# Patient Record
Sex: Male | Born: 1960 | Race: White | Hispanic: No | Smoking: Never smoker
Health system: Southern US, Community
[De-identification: ages and names within clinical notes are randomized; demographics above are authoritative.]

## PROBLEM LIST (undated history)

## (undated) DIAGNOSIS — I10 Essential (primary) hypertension: Secondary | ICD-10-CM

## (undated) DIAGNOSIS — Z973 Presence of spectacles and contact lenses: Secondary | ICD-10-CM

## (undated) DIAGNOSIS — R1909 Other intra-abdominal and pelvic swelling, mass and lump: Secondary | ICD-10-CM

## (undated) DIAGNOSIS — Z7189 Other specified counseling: Secondary | ICD-10-CM

## (undated) DIAGNOSIS — R222 Localized swelling, mass and lump, trunk: Secondary | ICD-10-CM

## (undated) DIAGNOSIS — G709 Myoneural disorder, unspecified: Secondary | ICD-10-CM

## (undated) DIAGNOSIS — Z8669 Personal history of other diseases of the nervous system and sense organs: Secondary | ICD-10-CM

## (undated) DIAGNOSIS — F419 Anxiety disorder, unspecified: Secondary | ICD-10-CM

## (undated) DIAGNOSIS — C8115 Nodular sclerosis classical Hodgkin lymphoma, lymph nodes of inguinal region and lower limb: Secondary | ICD-10-CM

## (undated) DIAGNOSIS — Z923 Personal history of irradiation: Secondary | ICD-10-CM

## (undated) DIAGNOSIS — G459 Transient cerebral ischemic attack, unspecified: Secondary | ICD-10-CM

## (undated) DIAGNOSIS — I499 Cardiac arrhythmia, unspecified: Secondary | ICD-10-CM

## (undated) DIAGNOSIS — G20A1 Parkinson's disease without dyskinesia, without mention of fluctuations: Secondary | ICD-10-CM

## (undated) DIAGNOSIS — I493 Ventricular premature depolarization: Secondary | ICD-10-CM

## (undated) DIAGNOSIS — Z9484 Stem cells transplant status: Secondary | ICD-10-CM

## (undated) HISTORY — DX: Personal history of irradiation: Z92.3

## (undated) HISTORY — DX: Other specified counseling: Z71.89

---
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
## 2000-04-02 ENCOUNTER — Other Ambulatory Visit: Admission: RE | Admit: 2000-04-02 | Discharge: 2000-04-02 | Payer: Self-pay | Admitting: Oral Surgery

## 2001-03-06 ENCOUNTER — Other Ambulatory Visit: Admission: RE | Admit: 2001-03-06 | Discharge: 2001-03-06 | Payer: Self-pay | Admitting: Oral Surgery

## 2004-03-14 ENCOUNTER — Ambulatory Visit (HOSPITAL_COMMUNITY): Admission: RE | Admit: 2004-03-14 | Discharge: 2004-03-14 | Payer: Self-pay | Admitting: Gastroenterology

## 2007-11-28 ENCOUNTER — Emergency Department (HOSPITAL_COMMUNITY): Admission: EM | Admit: 2007-11-28 | Discharge: 2007-11-29 | Payer: Self-pay | Admitting: Emergency Medicine

## 2011-04-13 NOTE — Op Note (Signed)
NAME:  Brian, [name]                            ACCOUNT NO.:  [id number]   MEDICAL RECORD NO.:  [id number]                   PATIENT TYPE:  AMB   LOCATION:  ENDO                                 FACILITY:  [facility]   PHYSICIAN:  John C. Madilyn Fireman, M.D.                 DATE OF BIRTH:  05/04/1961   DATE OF PROCEDURE:  03/14/2004  DATE OF DISCHARGE:                                 OPERATIVE REPORT   PROCEDURE:  Colonoscopy.   INDICATIONS FOR PROCEDURE:  Intermittent rectal bleeding and family history  of colon polyps in a first-degree relative.   DESCRIPTION OF PROCEDURE:  The patient was placed in the left lateral  decubitus position and placed on the pulse monitor with continuous low-flow  oxygen delivered by nasal cannula.  He was sedated with 87.5 mcg IV fentanyl  and 6 mg IV Versed.  The Olympus video colonoscope was inserted into the  rectum and advanced to the cecum, confirmed by transillumination at  [name]'s point and visualization of the ileocecal valve and appendiceal  orifice.  The prep was excellent.  The cecum, ascending, transverse,  descending, and sigmoid colon all appeared normal with no masses, polyps,  diverticula, or other mucosal abnormalities.  The rectum likewise appeared  normal, and retroflexed view of the anus showed some small internal  hemorrhoids.  The scope was then withdrawn, and the patient returned to the  recovery room in stable condition.  He tolerated the procedure well, and  there were no immediate complications.   IMPRESSION:  1. Internal hemorrhoids.  2. Otherwise, normal study.   PLAN:  Based on his family history of colon polyps, we will repeat  colonoscopy at age 50.                                               John C. Madilyn Fireman, M.D.    JCH/MEDQ  D:  03/14/2004  T:  03/14/2004  Job:  [id number]   cc:   Brett Canales A. [name], M.D.  [address]  [place]  [place] [address]  Fax: [phone]

## 2011-08-07 ENCOUNTER — Other Ambulatory Visit: Payer: Self-pay | Admitting: Emergency Medicine

## 2011-08-07 ENCOUNTER — Ambulatory Visit
Admission: RE | Admit: 2011-08-07 | Discharge: 2011-08-07 | Disposition: A | Discharge status: Home / Self Care | Payer: PRIVATE HEALTH INSURANCE | Source: Ambulatory Visit | Attending: Emergency Medicine | Admitting: Emergency Medicine

## 2011-08-07 DIAGNOSIS — N5089 Other specified disorders of the male genital organs: Secondary | ICD-10-CM

## 2011-08-07 MED ORDER — IOHEXOL 300 MG/ML  SOLN
100.0000 mL | Freq: Once | INTRAMUSCULAR | Status: AC | PRN
  Administered 2011-08-07: 100 mL via INTRAVENOUS

## 2011-08-08 ENCOUNTER — Ambulatory Visit
Admission: RE | Admit: 2011-08-08 | Discharge: 2011-08-08 | Disposition: A | Discharge status: Home / Self Care | Payer: PRIVATE HEALTH INSURANCE | Source: Ambulatory Visit | Attending: Emergency Medicine | Admitting: Emergency Medicine

## 2011-08-08 DIAGNOSIS — N5089 Other specified disorders of the male genital organs: Secondary | ICD-10-CM

## 2011-08-09 ENCOUNTER — Ambulatory Visit (INDEPENDENT_AMBULATORY_CARE_PROVIDER_SITE_OTHER): Payer: PRIVATE HEALTH INSURANCE | Admitting: Surgery

## 2011-08-09 ENCOUNTER — Encounter (INDEPENDENT_AMBULATORY_CARE_PROVIDER_SITE_OTHER): Payer: Self-pay | Admitting: Surgery

## 2011-08-09 VITALS — BP 122/72 | HR 72 | Temp 97.5°F | Ht 70.0 in | Wt 177.5 lb

## 2011-08-09 DIAGNOSIS — R591 Generalized enlarged lymph nodes: Secondary | ICD-10-CM

## 2011-08-09 DIAGNOSIS — C8195 Hodgkin lymphoma, unspecified, lymph nodes of inguinal region and lower limb: Secondary | ICD-10-CM | POA: Insufficient documentation

## 2011-08-09 DIAGNOSIS — R599 Enlarged lymph nodes, unspecified: Secondary | ICD-10-CM

## 2011-08-09 NOTE — Patient Instructions (Signed)
We will schedule he for outpatient surgery to remove some lymph nodes from the right groin area. They will go for examination by the pathologist and we should have the report two or three days after surgery.

## 2011-08-09 NOTE — Progress Notes (Signed)
Chief Complaint  Patient presents with  . Other    swollen lymph nodes, groin    HPI Brian [name] is a 51 y.o. male.  On a routine physical examination by his primary care physician some inguinal adenopathy was noted on the right side. He was asymptomatic although he does note that he had a couple of sweaty nights recently. He's had no abdominal pain no change in his energy level no weight loss or weight gain and overall feels well.  Because of his palpable abnormality an abdominal pelvic CT scan was done. This has shown some periaortic and iliac lymphadenopathy. He was sent to see me for evaluation for possible biopsy. HPI  History reviewed. No pertinent past medical history.  History reviewed. No pertinent past surgical history.  Family History  Problem Relation Age of Onset  . Cancer Mother 41    ovarian  . Heart disease Father   . Stroke Father   . Cancer Sister     ovarian  . Cancer Brother     testicular    Social History History  Substance Use Topics  . Smoking status: Never Smoker   . Smokeless tobacco: Not on file  . Alcohol Use: Yes    No Known Allergies  No current outpatient prescriptions on file.    Review of Systems Review of Systems  Constitutional: Negative for fever, chills and unexpected weight change.  HENT: Negative for hearing loss, congestion, sore throat, trouble swallowing and voice change.   Eyes: Negative for visual disturbance.  Respiratory: Negative for cough and wheezing.   Cardiovascular: Negative for chest pain, palpitations and leg swelling.  Gastrointestinal: Negative for nausea, vomiting, abdominal pain, diarrhea, constipation, blood in stool, abdominal distention, anal bleeding and rectal pain.  Genitourinary: Negative for hematuria and difficulty urinating.  Musculoskeletal: Negative for arthralgias.  Skin: Negative for rash and wound.  Neurological: Negative for seizures, syncope, weakness and headaches.  Hematological:  Negative for adenopathy. Does not bruise/bleed easily.  Psychiatric/Behavioral: Negative for confusion.     Blood pressure 122/72, pulse 72, temperature 97.5 F (36.4 C), temperature source Temporal, height 5\' 10"  (1.778 m), weight 177 lb 8 oz (80.513 kg).  Physical Exam Physical Exam GENERAL: The patient is alert, oriented, and generally healthy-appearing, NAD. Mood and affect are normal.  HEENT: The head is normocephalic, the eyes nonicteric, the pupils were round regular and equal. EOMs are normal. Pharynx normal. Dentition good.  NECK: The neck is supple and there are no masses or thyromegaly.  LUNGS: Normal respirations and clear to auscultation.  HEART: Regular rhythm, with no murmurs rubs or gallops. Pulses are intact carotid dorsalis pedis and posterior tibial. No significant varicosities are noted.  ABDOMEN: Soft, flat, and nontender. No masses or organomegaly is noted. No hernias are noted. Bowel sounds are normal. There is bilateral inguinal adenopath, R>L  GU:nl EXTREMITIES: Good range of motion, no edema.   Data Reviewed I have reviewed the laboratory studies from Dr. Juline Patch office, his CT scan and report and reviewed the scan with patient. He does have a somewhat low hemoglobin of 11.4 with several months ago it was 14. Except for that his labs were unremarkable.  CT scan confirms some periaortic and iliac adenopathy. Most of the nodes are slightly enlarged at about 1.2 cm, and there is one about 2.4 cm.  Assessment    He appears to have bilateral adenopathy which is somewhat worrisome for possible Hodgkin's disease or lymphoma.    Plan  I think we need to surgically remove some of these right inguinal lymph nodes for pathological examination. These are nondiagnostic, we'll need to review options for biopsy of some of the intra-abdominal nodes.  I've discussed this plan with the patient in detail. We talked about the surgical indications and described what is  involved with the surgery along with risks and complications. I think all questions have been answered.       Aldine Chakraborty J 08/09/2011, 4:34 PM

## 2011-08-15 ENCOUNTER — Encounter (HOSPITAL_COMMUNITY)
Admission: RE | Admit: 2011-08-15 | Discharge: 2011-08-15 | Disposition: A | Discharge status: Home / Self Care | Payer: PRIVATE HEALTH INSURANCE | Source: Ambulatory Visit | Attending: Surgery | Admitting: Surgery

## 2011-08-15 ENCOUNTER — Other Ambulatory Visit (INDEPENDENT_AMBULATORY_CARE_PROVIDER_SITE_OTHER): Payer: Self-pay | Admitting: Surgery

## 2011-08-15 DIAGNOSIS — R591 Generalized enlarged lymph nodes: Secondary | ICD-10-CM

## 2011-08-15 LAB — SURGICAL PCR SCREEN: Staphylococcus aureus: NEGATIVE

## 2011-08-16 ENCOUNTER — Other Ambulatory Visit (INDEPENDENT_AMBULATORY_CARE_PROVIDER_SITE_OTHER): Payer: Self-pay | Admitting: Surgery

## 2011-08-16 ENCOUNTER — Encounter (INDEPENDENT_AMBULATORY_CARE_PROVIDER_SITE_OTHER): Payer: Self-pay | Admitting: Surgery

## 2011-08-16 ENCOUNTER — Ambulatory Visit (HOSPITAL_COMMUNITY)
Admission: RE | Admit: 2011-08-16 | Discharge: 2011-08-16 | Disposition: A | Discharge status: Home / Self Care | Payer: PRIVATE HEALTH INSURANCE | Source: Ambulatory Visit | Attending: Surgery | Admitting: Surgery

## 2011-08-16 DIAGNOSIS — Z807 Family history of other malignant neoplasms of lymphoid, hematopoietic and related tissues: Secondary | ICD-10-CM | POA: Insufficient documentation

## 2011-08-16 DIAGNOSIS — Z01818 Encounter for other preprocedural examination: Secondary | ICD-10-CM | POA: Insufficient documentation

## 2011-08-16 DIAGNOSIS — R599 Enlarged lymph nodes, unspecified: Secondary | ICD-10-CM

## 2011-08-16 DIAGNOSIS — Z01812 Encounter for preprocedural laboratory examination: Secondary | ICD-10-CM | POA: Insufficient documentation

## 2011-08-16 DIAGNOSIS — Z8043 Family history of malignant neoplasm of testis: Secondary | ICD-10-CM | POA: Insufficient documentation

## 2011-08-16 HISTORY — PX: OTHER SURGICAL HISTORY: SHX169

## 2011-08-17 NOTE — Op Note (Signed)
  NAMEKENITH, TRICKEL                  ACCOUNT NO.:  [id number]  MEDICAL RECORD NO.:  [id number]  LOCATION:  SDSC                         FACILITY:  [facility]  PHYSICIAN:  [name], M.D.DATE OF BIRTH:  09-06-1961  DATE OF PROCEDURE:  08/16/2011 DATE OF DISCHARGE:                              OPERATIVE REPORT   PREOPERATIVE DIAGNOSIS:  Lymphadenopathy.  POSTOPERATIVE DIAGNOSIS:  Lymphadenopathy.  PROCEDURE:  Removal of right inguinal lymph nodes.  SURGEON:  [name], M.D.  ANESTHESIA:  MAC.  CLINICAL HISTORY:  This is a 50 year old gentleman who has a family history of both lymphoma and testicular cancer who developed some lymphadenopathy noted on routine physical and was otherwise asymptomatic.  CT had shown some nodes on the abdomen and he had palpable right inguinal lymph nodes what was noted on original physical. After discussion with the patient, we elected to remove some of these right inguinal lymph nodes as surgical biopsy.  DESCRIPTION OF PROCEDURE:  The patient was seen in the preoperative area and had no further questions.  Identified and marked the right inguinal area as the surgical site.  The patient was taken to the operating room, given IV sedation.  The groin area was clipped, prepped and draped and the time-out was done. Xylocaine 0.1% plus 0.5% Marcaine with epinephrine was mixed locally and used for local.  I made an inguinal incision directly in the inguinal crease over some palpable adenopathy.  Small superficial vein was ligated.  A firm, what appeared to be about 1 cm node was identified very superficially and I started to dissect that out, and it had connected to a second node a little bit more superior that came out as well.  I could not tell if this was a dumbbell shaped lymph node or two nodes with a fairly thick attachment between the two.  This was all done with cautery and blunt dissection.  The second lymph node was  palpable more medially and medial to the vessels.  I was able to dissect that out, and this was about a 2.5 cm, firm, pink lymph node.  It came out intact and in toto.  I made sure everything was dry.  The incision was then closed with 3-0 Vicryl, 4-0 Monocryl subcuticular and some Dermabond.  The patient tolerated the procedure well, and there were no complications.  All counts were correct.     [name], M.D.     CJS/MEDQ  D:  08/16/2011  T:  08/16/2011  Job:  [id number]  Electronically Signed by Cyndia Bent M.D. on 08/17/2011 02:32:43 PM

## 2011-08-20 ENCOUNTER — Telehealth (INDEPENDENT_AMBULATORY_CARE_PROVIDER_SITE_OTHER): Payer: Self-pay | Admitting: General Surgery

## 2011-08-20 NOTE — Telephone Encounter (Signed)
Patient aware path is not malignant. He will follow up with [place] on Thursday and call with any problems prior to that.

## 2011-08-20 NOTE — Telephone Encounter (Signed)
Dr [name] called re: patient's pathology results. Lymph node biopsy was negative. Dr [name] is going to refer patient to Dr Myna Hidalgo for evaluation because he still has multiple nodes and he would rather he manage this. If you need to speak with him you can call or if you have any other suggestions.

## 2011-08-20 NOTE — Telephone Encounter (Signed)
Message copied by Liliana Cline on Mon Aug 20, 2011 10:56 AM ------      Message from: [name]      Created: Sat Aug 18, 2011 11:01 AM       Tell him path is benign, no lymphoma or cancer. I can discuss more when he comes in for post op visit

## 2011-08-22 ENCOUNTER — Ambulatory Visit (INDEPENDENT_AMBULATORY_CARE_PROVIDER_SITE_OTHER): Payer: PRIVATE HEALTH INSURANCE | Admitting: Surgery

## 2011-08-23 ENCOUNTER — Encounter (INDEPENDENT_AMBULATORY_CARE_PROVIDER_SITE_OTHER): Payer: Self-pay | Admitting: Surgery

## 2011-08-23 ENCOUNTER — Ambulatory Visit (INDEPENDENT_AMBULATORY_CARE_PROVIDER_SITE_OTHER): Payer: PRIVATE HEALTH INSURANCE | Admitting: Surgery

## 2011-08-23 VITALS — BP 126/84 | HR 56 | Temp 97.4°F | Resp 12 | Ht 70.0 in | Wt 177.6 lb

## 2011-08-23 DIAGNOSIS — R591 Generalized enlarged lymph nodes: Secondary | ICD-10-CM

## 2011-08-23 DIAGNOSIS — R599 Enlarged lymph nodes, unspecified: Secondary | ICD-10-CM

## 2011-08-23 NOTE — Progress Notes (Signed)
NAME: Brian [name]                                            DOB: Apr 28, 1961 DATE: 08/23/2011                                                  MRN: [id number]  CC: Post op R LN excision  HPI: This patient comes in for post op follow-up.Heunderwent Right inguinal lymph node excision on 08/16/2011. He feels that he is doing well.  PE: General: The patient appears to be healthy, NAD The incision is healing OK, but there is a little fluid just above it  DATA REVIEWED: Path report noted and discussed with him. Gave him a copy of the report  IMPRESSION: The patient is doing well S/P Node biopsy, but small seroma.    PLAN: Seroma aspirated of 3 cc of clear fluid. RTC PRN

## 2011-08-23 NOTE — Patient Instructions (Signed)
If you think there is more fluid around the incision call and come back to be checked. Otherwise you don't have to come back.  Avoid strenuous activity another week

## 2011-08-27 ENCOUNTER — Ambulatory Visit: Payer: PRIVATE HEALTH INSURANCE | Admitting: Hematology & Oncology

## 2011-09-06 ENCOUNTER — Ambulatory Visit (HOSPITAL_BASED_OUTPATIENT_CLINIC_OR_DEPARTMENT_OTHER): Payer: PRIVATE HEALTH INSURANCE | Admitting: Hematology & Oncology

## 2011-09-06 ENCOUNTER — Other Ambulatory Visit: Payer: Self-pay | Admitting: Hematology & Oncology

## 2011-09-06 DIAGNOSIS — R599 Enlarged lymph nodes, unspecified: Secondary | ICD-10-CM

## 2011-09-06 LAB — CBC WITH DIFFERENTIAL (CANCER CENTER ONLY)
BASO#: 0 10*3/uL (ref 0.0–0.2)
HCT: 37.6 % — ABNORMAL LOW (ref 38.7–49.9)
HGB: 12.3 g/dL — ABNORMAL LOW (ref 13.0–17.1)
LYMPH#: 0.9 10*3/uL (ref 0.9–3.3)
LYMPH%: 10 % — ABNORMAL LOW (ref 14.0–48.0)
MCHC: 32.7 g/dL (ref 32.0–35.9)
MCV: 82 fL (ref 82–98)
MONO#: 0.8 10*3/uL (ref 0.1–0.9)
NEUT%: 80.6 % — ABNORMAL HIGH (ref 40.0–80.0)

## 2011-09-06 LAB — COMPREHENSIVE METABOLIC PANEL
Albumin: 4.2 g/dL (ref 3.5–5.2)
BUN: 17 mg/dL (ref 6–23)
Calcium: 9.3 mg/dL (ref 8.4–10.5)
Chloride: 104 mEq/L (ref 96–112)
Glucose, Bld: 116 mg/dL — ABNORMAL HIGH (ref 70–99)
Potassium: 4.4 mEq/L (ref 3.5–5.3)

## 2011-09-06 LAB — SEDIMENTATION RATE: Sed Rate: 23 mm/hr — ABNORMAL HIGH (ref 0–16)

## 2011-09-06 LAB — LACTATE DEHYDROGENASE: LDH: 126 U/L (ref 94–250)

## 2011-09-13 ENCOUNTER — Other Ambulatory Visit: Payer: Self-pay | Admitting: Hematology & Oncology

## 2011-09-13 DIAGNOSIS — R591 Generalized enlarged lymph nodes: Secondary | ICD-10-CM

## 2011-12-26 ENCOUNTER — Ambulatory Visit
Admission: RE | Admit: 2011-12-26 | Discharge: 2011-12-26 | Disposition: A | Discharge status: Home / Self Care | Payer: PRIVATE HEALTH INSURANCE | Source: Ambulatory Visit | Attending: Hematology & Oncology | Admitting: Hematology & Oncology

## 2011-12-26 DIAGNOSIS — R591 Generalized enlarged lymph nodes: Secondary | ICD-10-CM

## 2011-12-26 MED ORDER — IOHEXOL 300 MG/ML  SOLN
100.0000 mL | Freq: Once | INTRAMUSCULAR | Status: AC | PRN
  Administered 2011-12-26: 100 mL via INTRAVENOUS

## 2011-12-27 ENCOUNTER — Other Ambulatory Visit: Payer: Self-pay | Admitting: Hematology & Oncology

## 2011-12-31 ENCOUNTER — Telehealth: Payer: Self-pay | Admitting: Hematology & Oncology

## 2011-12-31 NOTE — Telephone Encounter (Signed)
Pt has called numerous times wanting CT results. He is aware someone will call him before his appointment Thursday. RN is aware of situation

## 2012-01-01 ENCOUNTER — Other Ambulatory Visit: Payer: Self-pay | Admitting: Hematology & Oncology

## 2012-01-01 DIAGNOSIS — R591 Generalized enlarged lymph nodes: Secondary | ICD-10-CM

## 2012-01-02 ENCOUNTER — Telehealth: Payer: Self-pay | Admitting: Hematology & Oncology

## 2012-01-02 NOTE — Telephone Encounter (Signed)
Schedule CT with Roberta. Pt is aware of 05-05-12 CT, to be NPO 4 hrs and to pick up contrast

## 2012-01-03 ENCOUNTER — Ambulatory Visit: Payer: PRIVATE HEALTH INSURANCE | Admitting: Hematology & Oncology

## 2012-01-03 ENCOUNTER — Other Ambulatory Visit: Payer: PRIVATE HEALTH INSURANCE | Admitting: Lab

## 2012-01-16 ENCOUNTER — Telehealth: Payer: Self-pay | Admitting: Hematology & Oncology

## 2012-01-16 NOTE — Telephone Encounter (Signed)
Faxed CT results to Dr. Brett Canales Daub's office per Dr. Gustavo Lah request.

## 2012-05-02 ENCOUNTER — Other Ambulatory Visit: Payer: Self-pay | Admitting: *Deleted

## 2012-05-02 NOTE — Progress Notes (Signed)
error 

## 2012-05-05 ENCOUNTER — Ambulatory Visit
Admission: RE | Admit: 2012-05-05 | Discharge: 2012-05-05 | Disposition: A | Discharge status: Home / Self Care | Payer: PRIVATE HEALTH INSURANCE | Source: Ambulatory Visit | Attending: Hematology & Oncology | Admitting: Hematology & Oncology

## 2012-05-05 ENCOUNTER — Inpatient Hospital Stay: Admission: RE | Admit: 2012-05-05 | Payer: PRIVATE HEALTH INSURANCE | Source: Ambulatory Visit

## 2012-05-05 ENCOUNTER — Other Ambulatory Visit: Payer: Self-pay | Admitting: *Deleted

## 2012-05-05 DIAGNOSIS — R591 Generalized enlarged lymph nodes: Secondary | ICD-10-CM

## 2012-05-05 MED ORDER — IOHEXOL 300 MG/ML  SOLN
100.0000 mL | Freq: Once | INTRAMUSCULAR | Status: AC | PRN
  Administered 2012-05-05: 100 mL via INTRAVENOUS

## 2012-05-08 ENCOUNTER — Telehealth: Payer: Self-pay | Admitting: *Deleted

## 2012-05-08 NOTE — Telephone Encounter (Addendum)
Message copied by Wynonia Hazard on Thu May 08, 2012 11:55 AM ------      Message from: Arlan Organ R      Created: Wed May 07, 2012  6:54 PM       Please call and tell him that the lymph nodes are not growing. Everything looks pretty stable. Nothing to do right now. Brian [name]  Spoke to pt and gave him the above message with verbalized understanding.

## 2012-08-12 ENCOUNTER — Other Ambulatory Visit: Payer: Self-pay | Admitting: Emergency Medicine

## 2012-08-12 ENCOUNTER — Ambulatory Visit (INDEPENDENT_AMBULATORY_CARE_PROVIDER_SITE_OTHER): Payer: Commercial Managed Care - PPO | Admitting: Emergency Medicine

## 2012-08-12 VITALS — BP 124/70 | HR 71 | Temp 98.2°F | Resp 16 | Ht 71.0 in | Wt 172.0 lb

## 2012-08-12 DIAGNOSIS — Z23 Encounter for immunization: Secondary | ICD-10-CM

## 2012-08-12 DIAGNOSIS — Z0000 Encounter for general adult medical examination without abnormal findings: Secondary | ICD-10-CM

## 2012-08-12 DIAGNOSIS — R634 Abnormal weight loss: Secondary | ICD-10-CM | POA: Insufficient documentation

## 2012-08-12 DIAGNOSIS — G4700 Insomnia, unspecified: Secondary | ICD-10-CM

## 2012-08-12 DIAGNOSIS — Z8669 Personal history of other diseases of the nervous system and sense organs: Secondary | ICD-10-CM

## 2012-08-12 DIAGNOSIS — R591 Generalized enlarged lymph nodes: Secondary | ICD-10-CM

## 2012-08-12 LAB — COMPREHENSIVE METABOLIC PANEL
ALT: 13 U/L (ref 0–53)
AST: 14 U/L (ref 0–37)
Alkaline Phosphatase: 109 U/L (ref 39–117)
CO2: 29 mEq/L (ref 19–32)
Sodium: 139 mEq/L (ref 135–145)
Total Bilirubin: 0.4 mg/dL (ref 0.3–1.2)
Total Protein: 6.8 g/dL (ref 6.0–8.3)

## 2012-08-12 LAB — IFOBT (OCCULT BLOOD): IFOBT: NEGATIVE

## 2012-08-12 LAB — POCT URINALYSIS DIPSTICK
Blood, UA: NEGATIVE
Glucose, UA: NEGATIVE
Nitrite, UA: NEGATIVE
Urobilinogen, UA: 0.2

## 2012-08-12 LAB — POCT UA - MICROSCOPIC ONLY
Casts, Ur, LPF, POC: NEGATIVE
Yeast, UA: NEGATIVE

## 2012-08-12 LAB — LIPID PANEL
LDL Cholesterol: 98 mg/dL (ref 0–99)
Total CHOL/HDL Ratio: 4 Ratio
VLDL: 10 mg/dL (ref 0–40)

## 2012-08-12 LAB — CBC WITH DIFFERENTIAL/PLATELET
Eosinophils Absolute: 0.1 10*3/uL (ref 0.0–0.7)
Lymphs Abs: 0.8 10*3/uL (ref 0.7–4.0)
MCH: 24.5 pg — ABNORMAL LOW (ref 26.0–34.0)
Neutrophils Relative %: 71 % (ref 43–77)
Platelets: 484 10*3/uL — ABNORMAL HIGH (ref 150–400)
RBC: 4.77 MIL/uL (ref 4.22–5.81)
WBC: 7.4 10*3/uL (ref 4.0–10.5)

## 2012-08-12 LAB — TSH: TSH: 3.133 u[IU]/mL (ref 0.350–4.500)

## 2012-08-12 LAB — SEDIMENTATION RATE: Sed Rate: 30 mm/hr — ABNORMAL HIGH (ref 0–16)

## 2012-08-12 MED ORDER — ALPRAZOLAM 0.5 MG PO TABS: 0.5000 mg | ORAL_TABLET | Freq: Every evening | ORAL | Status: DC | PRN

## 2012-08-12 NOTE — Progress Notes (Signed)
@[email or web address] @  Patient ID: Brian [name] MRN: [id number], DOB: 1961-02-16 51 y.o. Date of Encounter: 08/12/2012, 5:55 PM  Primary Physician: Lucilla Edin, MD  Chief Complaint: Physical (CPE)  HPI: 51 y.o. y/o male with history noted below here for CPE.  Doing well. No issues/complaints.  Review of Systems:  Consitutional: No fever, chills, fatigue, night sweats, he has noticed weight loss over the last year which she associates with stress. A new home and a job change ,  . He is known to have lymphadenopathy and is status post biopsy of a lymph node in the right inguinal area last year Eyes: No visual changes, eye redness, or discharge. ENT/Mouth: Ears: No otalgia, tinnitus, hearing loss, discharge. Nose: No congestion, rhinorrhea, sinus pain, or epistaxis. Throat: No sore throat, post nasal drip, or teeth pain. Cardiovascular: No CP, palpitations, diaphoresis, DOE, edema, orthopnea, PND. Respiratory: No cough, hemoptysis, SOB, or wheezing. Gastrointestinal: No anorexia, dysphagia, reflux, pain, nausea, vomiting, hematemesis, diarrhea, constipation, BRBPR, or melena. Genitourinary: No dysuria, frequency, urgency, hematuria, incontinence, nocturia, decreased urinary stream, discharge, impotence, or testicular pain/masses. Musculoskeletal: No decreased ROM, myalgias, stiffness, joint swelling, or weakness. Skin: No rash, erythema, lesion changes, pain, warmth, jaundice, or pruritis. Neurological: No headache, dizziness, syncope, seizures, tremors, memory loss, coordination problems, or paresthesias. Psychological: No anxiety, depression, hallucinations, SI/HI. Endocrine: No fatigue, polydipsia, polyphagia, polyuria, or known diabetes. All other systems were reviewed and are otherwise negative.  No past medical history on file.   Past Surgical History  Procedure Date  . Lymph node biopsy 08/16/2011    Right inguinal Dr [name]    Home Meds:  Prior to Admission medications   Medication  Sig Start Date End Date Taking? Authorizing Provider  ALPRAZolam Prudy Feeler) 0.5 MG tablet Take 1 tablet (0.5 mg total) by mouth at bedtime as needed. 08/12/12  Yes Collene Gobble, MD  Multiple Vitamin (MULTIVITAMIN) tablet Take 1 tablet by mouth daily.   Yes Historical Provider, MD    Allergies: No Known Allergies  History   Social History  . Marital Status: Married    Spouse Name: N/A    Number of Children: N/A  . Years of Education: N/A   Occupational History  . Not on file.   Social History Main Topics  . Smoking status: Never Smoker   . Smokeless tobacco: Not on file  . Alcohol Use: Yes  . Drug Use: No  . Sexually Active: Not on file   Other Topics Concern  . Not on file   Social History Narrative  . No narrative on file    Family History  Problem Relation Age of Onset  . Cancer Mother 30    ovarian  . Heart disease Father   . Stroke Father   . Cancer Sister     ovarian  . Cancer Brother     testicular    Physical Exam:  Blood pressure 124/70, pulse 71, temperature 98.2 F (36.8 C), resp. rate 16, height 5\' 11"  (1.803 m), weight 172 lb (78.019 kg).  General: Well developed, well nourished, in no acute distress. HEENT: Normocephalic, atraumatic. Conjunctiva pink, sclera non-icteric. Pupils 2 mm constricting to 1 mm, round, regular, and equally reactive to light and accomodation. EOMI. Internal auditory canal clear. TMs with good cone of light and without pathology. Nasal mucosa pink. Nares are without discharge. No sinus tenderness. Oral mucosa pink. Dentition normal. Pharynx without exudate.   Neck: Supple. Trachea midline. No thyromegaly. Full ROM. No lymphadenopathy. Lungs: Clear to auscultation  bilaterally without wheezes, rales, or rhonchi. Breathing is of normal effort and unlabored. Cardiovascular: RRR with S1 S2. No murmurs, rubs, or gallops appreciated. Distal pulses 2+ symmetrically. No carotid or abdominal bruits.  Abdomen: Soft, non-tender,  non-distended with normoactive bowel sounds. No hepatosplenomegaly there is a mild fullness in the left lower quadrant . No rebound/guarding. No CVA tenderness. Without hernias. There is a 2 cm node present just above the left inguinal area.  Rectal: No external hemorrhoids or fissures. Rectal vault without masses.   Genitourinary:   circumcised male. No penile lesions. Testes descended bilaterally, and smooth without tenderness or masses.  Musculoskeletal: Full range of motion and 5/5 strength throughout. Without swelling, atrophy, tenderness, crepitus, or warmth. Extremities without clubbing, cyanosis, or edema. Calves supple. Skin: Warm and moist without erythema, ecchymosis, wounds, or rash. Neuro: A+Ox3. 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 Full sensation throughout. Normal gait. DTR 2+ throughout upper and lower extremities. Finger to nose intact. Psych:  Responds to questions appropriately with a normal affect.   Results for orders placed in visit on 08/12/12  IFOBT (OCCULT BLOOD)      Component Value Range   IFOBT Negative    POCT UA - MICROSCOPIC ONLY      Component Value Range   WBC, Ur, HPF, POC 0-1     RBC, urine, microscopic 0-1     Bacteria, U Microscopic neg     Mucus, UA neg     Epithelial cells, urine per micros 0-1     Crystals, Ur, HPF, POC neg     Casts, Ur, LPF, POC neg     Yeast, UA neg    POCT URINALYSIS DIPSTICK      Component Value Range   Color, UA yellow     Clarity, UA clear     Glucose, UA neg     Bilirubin, UA neg     Ketones, UA neg     Spec Grav, UA 1.025     Blood, UA neg     pH, UA 6.0     Protein, UA neg     Urobilinogen, UA 0.2     Nitrite, UA neg     Leukocytes, UA Negative     Studies: CBC, CMET, Lipid, PSA, TSH, all pending.       Assessment/Plan:  51 y.o. y/o   male here for CPE I'm very concerned this patient has an occult malignancy. I worried mostly about  some form of  lymphoma. He has had a weight loss of 4 pounds in the last year. He does have an enlarging lymph node in the left inguinal area. I'm going to refer him back to Dr. Arlan Organ for reevaluation and his suggestions about further evaluation -  Signed, Earl Lites, MD 08/12/2012 5:55 PM

## 2012-08-13 LAB — PSA: PSA: 1.97 ng/mL (ref <=4.00)

## 2012-08-14 ENCOUNTER — Telehealth: Payer: Self-pay | Admitting: Radiology

## 2012-08-14 DIAGNOSIS — D649 Anemia, unspecified: Secondary | ICD-10-CM

## 2012-08-14 LAB — IRON AND TIBC
%SAT: 5 % — ABNORMAL LOW (ref 20–55)
Iron: 17 ug/dL — ABNORMAL LOW (ref 42–165)

## 2012-08-14 NOTE — Telephone Encounter (Signed)
Dr [name] wants me to advise patient he has spoken to Dr Myna Hidalgo. He should be hearing from their office soon regarding an appt.

## 2012-08-14 NOTE — Telephone Encounter (Signed)
I spoke to Dr [name] about the labs and have called patient to advise.  Please call patient let him know his hemoglobin is still low at 11.7 with a large percent of monocytes with a white count of 7400. I still need him to see Dr Myna Hidalgo." and get his opinion about the next step. Please be sure referrals is making the appointment with Dr. Myna Hidalgo. Please be sure copy of my note is centered Dr. Myna Hidalgo along with a copy of these labs .  Patient also advised to call Dr [name] tonight on his cell.  Also Iron and TIBC are added on to his labs from earlier in the week.  Patient to call me back tomorrow if he does not hear from Dr Myna Hidalgo about appt.

## 2012-08-14 NOTE — Telephone Encounter (Signed)
Called patient to advise. Left message for him to call me back.  

## 2012-08-14 NOTE — Telephone Encounter (Signed)
Patient advised he was told by Dr Tama Gander office appt will not be made for him until next week since Brian [name] is out of the office Brian [name] is scheduler) I told him to let me know if he has not heard anything from them by tomorrow. Patient wants you to call him about his labs, I can call back , what do you want me to tell him.?

## 2012-08-15 ENCOUNTER — Telehealth: Payer: Self-pay | Admitting: Hematology & Oncology

## 2012-08-15 ENCOUNTER — Other Ambulatory Visit: Payer: Self-pay | Admitting: Hematology & Oncology

## 2012-08-15 ENCOUNTER — Encounter: Payer: Self-pay | Admitting: Emergency Medicine

## 2012-08-15 ENCOUNTER — Other Ambulatory Visit: Payer: Self-pay | Admitting: Emergency Medicine

## 2012-08-15 ENCOUNTER — Telehealth: Payer: Self-pay | Admitting: Radiology

## 2012-08-15 DIAGNOSIS — D649 Anemia, unspecified: Secondary | ICD-10-CM

## 2012-08-15 DIAGNOSIS — R591 Generalized enlarged lymph nodes: Secondary | ICD-10-CM

## 2012-08-15 DIAGNOSIS — R198 Other specified symptoms and signs involving the digestive system and abdomen: Secondary | ICD-10-CM

## 2012-08-15 NOTE — Telephone Encounter (Signed)
Patient has an appointment to see Dr. Myna Hidalgo next week. Nothing further needs to be done through our office to

## 2012-08-15 NOTE — Telephone Encounter (Signed)
Patient called he has still not heard anything from Dr Myna Hidalgo. I called the office and spoke to JoEllen who has spoken to Dr Myna Hidalgo. Patient will be worked in next week Marland Kitchen will call patient today to let him know. I have sent the labs to Dr Madilyn Fireman, Dr Myna Hidalgo will be able to view on Epic.

## 2012-08-15 NOTE — Telephone Encounter (Signed)
Message copied by Caffie Damme on Fri Aug 15, 2012  2:09 PM ------      Message from: Lesle Chris A      Created: Fri Aug 15, 2012  9:00 AM       Please mail copy of these levels to Dr. Myna Hidalgo and to Dr. Madilyn Fireman at [facility]

## 2012-08-15 NOTE — Telephone Encounter (Signed)
Per MD to sch appt for patient next week.  Appt was sch for 08/19/12.  I called patient and gave appt date and time.  Patient is aware of appt and states he will be here.

## 2012-08-17 ENCOUNTER — Encounter: Payer: Self-pay | Admitting: Radiology

## 2012-08-19 ENCOUNTER — Telehealth: Payer: Self-pay | Admitting: Hematology & Oncology

## 2012-08-19 ENCOUNTER — Ambulatory Visit (HOSPITAL_BASED_OUTPATIENT_CLINIC_OR_DEPARTMENT_OTHER): Payer: Commercial Managed Care - PPO | Admitting: Hematology & Oncology

## 2012-08-19 VITALS — BP 126/79 | HR 69 | Temp 98.4°F | Resp 20 | Ht 71.0 in | Wt 174.0 lb

## 2012-08-19 DIAGNOSIS — R599 Enlarged lymph nodes, unspecified: Secondary | ICD-10-CM

## 2012-08-19 DIAGNOSIS — R591 Generalized enlarged lymph nodes: Secondary | ICD-10-CM

## 2012-08-19 DIAGNOSIS — D509 Iron deficiency anemia, unspecified: Secondary | ICD-10-CM

## 2012-08-19 NOTE — Progress Notes (Signed)
CC:   Brian [name]. Brian [name], M.D. Brian [name], M.D.  DIAGNOSES: 1. Lymphoid hyperplasia. 2. 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  CURRENT THERAPY:  Observation.  INTERIM HISTORY:  Brian [name] comes in for followup.  I last saw him back in October 2012.  At that point in time, he had been seen by Dr. Jamey [name]. He had a right inguinal lymph node that was biopsied.  This showed lymphoid hyperplasia.  He has had followup scans.  The scans really have not shown any evidence of growth of his lymph nodes.  However, he has had some growth of a left inguinal lymph node.  This was noted back in June.  At that point in time, it was 2.2 cm.  He saw Dr. Cleta [name].  Dr. Cleta [name] did some lab work on him a week or so ago. He was found to have mild anemia with hemoglobin 11.7 and hematocrit 36.5.  He was iron deficient with iron saturation of 5%.  He had a white cell differential of 81 segs, 10 lymphs, and 9 monos.  His electrolytes all looked okay.  Dr. Cleta [name] did have Brian [name] set up for an upper endoscopy and colonoscopy.  Brian [name] has been donating blood.  His last donation, I think was back in June.  He had a colonoscopy 2 years ago.  He has had no intake of aspirin or nonsteroidals.  He does not smoke.  He has felt good.  He has not lost weight.  He has not noticed any swollen lymph glands elsewhere.  He has had no rashes.  He has had no fevers.  He has had no double vision or blurred vision.  He has had no dysphagia or odynophagia.  PHYSICAL EXAMINATION:  General:  This is a well-developed, well- nourished white gentleman in no obvious distress.  Vital Signs:  Show a temperature of 98.4, pulse 69, respiratory rate 20, blood pressure 126/79, weight is 174.  [name] and Neck Exam:  Shows a normocephalic, atraumatic skull.  There are no ocular or oral lesions.  There are no palpable cervical or supraclavicular lymph nodes.  Lungs:  Clear bilaterally.  Cardiac  Exam:  Regular rate and rhythm with a normal S1 and S2.  There are no murmurs, rubs, or bruits.  Abdominal Exam:  Soft with good bowel sounds.  He does have a palpable lymph node in the left inguinal region.  This probably measures about 2 cm.  It is firm and mobile.  There is no right inguinal lymphadenopathy.  He has no hepatosplenomegaly.  Axillary Exam:  Shows no bilateral axillary adenopathy.  Extremities:  Show no clubbing, cyanosis, or edema.  Skin Exam:  No rashes, ecchymoses, or petechia.  IMPRESSION:  Brian [name] is a 51 year old gentleman with history lymphoid hyperplasia.  He now has a new lymph node in left inguinal region that is growing.  I think we are going to have to see about getting this biopsied.  Again, I think that there is a small but definite risk of lymphoma in a patient with lymphoid hyperplasia.  I do not think he needs to have an upper endoscopy or colonoscopy.  I spoke with Dr. Dorena [name] of GI about this.  He agrees that an endoscopic evaluation could be put on hold unless Brian [name] has obvious GI bleeding.  His iron-deficiency anemia is the result of his blood donations.  I told him not to donate blood for a year.  I  applauded him for definitely donating.  He is on iron supplements at the present time.  I do not see that any scans need to be done on him.  Again, his last scans done in June really did not show any growth anywhere outside of the left inguinal region.  This certainly is a tough situation.  I tried to reassure Brian [name] that I just did not think that he had any problems with lymphoma.  I thought that his lymphoid hyperplasia would always be with him, but that it just would not cause any issues.  It would be nice to get him back to see [place] in another couple of months for followup.  He will be on his iron supplements.  We could certainly see how everything looks.  I spent about 45 minutes with he and his wife.  They are both very  nice people.  I enjoyed seeing him again.  I left a message for Dr. Jamey [name] to call me about setting him up for another excisional biopsy.    ______________________________ Brian [name], M.D. PRE/MEDQ  D:  08/19/2012  T:  08/19/2012  Job:  [id number]

## 2012-08-19 NOTE — Progress Notes (Signed)
This office note has been dictated.

## 2012-08-19 NOTE — Telephone Encounter (Signed)
Follow up appointments were sch and calendar was mailed out to patient's home

## 2012-08-21 ENCOUNTER — Other Ambulatory Visit (INDEPENDENT_AMBULATORY_CARE_PROVIDER_SITE_OTHER): Payer: Self-pay | Admitting: Surgery

## 2012-08-21 ENCOUNTER — Telehealth (INDEPENDENT_AMBULATORY_CARE_PROVIDER_SITE_OTHER): Payer: Self-pay | Admitting: General Surgery

## 2012-08-21 NOTE — Telephone Encounter (Signed)
Pt called to confirm appt on 10/03 at 2:00.

## 2012-08-21 NOTE — Telephone Encounter (Signed)
Appt made for patient on Thursday 08/28/2012. He will call back to confirm and we will evaluate in the office.

## 2012-08-21 NOTE — Telephone Encounter (Signed)
Message copied by Liliana Cline on Thu Aug 21, 2012  2:03 PM ------      Message from: [name]      Created: Thu Aug 21, 2012 12:53 PM       Dr Colin Benton wants me to do a left inguinal lymph node biospy on him - did the right last year. I will put orders into Epic so he can be scheduled, but he needs to come see me so I can examine him and talk to him

## 2012-08-26 HISTORY — PX: BONE MARROW BIOPSY: SHX199

## 2012-08-28 ENCOUNTER — Encounter (INDEPENDENT_AMBULATORY_CARE_PROVIDER_SITE_OTHER): Payer: Self-pay | Admitting: Surgery

## 2012-08-28 ENCOUNTER — Ambulatory Visit (INDEPENDENT_AMBULATORY_CARE_PROVIDER_SITE_OTHER): Payer: Commercial Managed Care - PPO | Admitting: Surgery

## 2012-08-28 ENCOUNTER — Encounter (INDEPENDENT_AMBULATORY_CARE_PROVIDER_SITE_OTHER): Payer: Self-pay | Admitting: General Surgery

## 2012-08-28 VITALS — BP 132/76 | HR 76 | Temp 97.1°F | Resp 16 | Ht 70.0 in | Wt 173.0 lb

## 2012-08-28 DIAGNOSIS — R591 Generalized enlarged lymph nodes: Secondary | ICD-10-CM

## 2012-08-28 DIAGNOSIS — R599 Enlarged lymph nodes, unspecified: Secondary | ICD-10-CM

## 2012-08-28 NOTE — Progress Notes (Signed)
Patient ID: Brian [name], male   DOB: 10-28-1961, 51 y.o.   MRN: [id number]  Chief Complaint  Patient presents with  . Lymphadenopathy    Left inguinal    HPI Brian [name] is a 51 y.o. male.  Developed a new enlarged lymph node in the left inguinal area. He is seen his hematologist and they have recommended a repeat lymph node excisional biopsy. He continues to not have any specific symptoms of lymphoma. He has had a history of Bell's palsy and a ulcers of the mouth. He has had no infections in the inguinal or groin or leg area to account for his enlarged lymph node. He had a CT scan done around June that did not show this enlarged node been known to have some mild enlarged lymphadenopathy. A year ago I did a right inguinal lymph node excisional biopsy which showed dermatopathic changes but was benign. He has been followed by a hematologist since then. HPI  History reviewed. No pertinent past medical history.  Past Surgical History  Procedure Date  . Lymph node biopsy 08/16/2011    Right inguinal Dr [name]    Family History  Problem Relation Age of Onset  . Cancer Mother 57    ovarian  . Heart disease Father   . Stroke Father   . Cancer Sister     ovarian  . Cancer Brother     testicular    Social History History  Substance Use Topics  . Smoking status: Never Smoker   . Smokeless tobacco: Not on file  . Alcohol Use: Yes    No Known Allergies  Current Outpatient Prescriptions  Medication Sig Dispense Refill  . ALPRAZolam (XANAX) 0.5 MG tablet Take 1 tablet (0.5 mg total) by mouth at bedtime as needed.  30 tablet  2  . Ferrous Sulfate (IRON) 325 (65 FE) MG TABS Take by mouth 2 (two) times daily.      . Multiple Vitamin (MULTIVITAMIN) tablet Take 1 tablet by mouth daily.        Review of Systems Review of Systems  Constitutional: Negative for fever, chills and unexpected weight change.  HENT: Negative for hearing loss, congestion, sore throat, trouble swallowing and  voice change.   Eyes: Negative for visual disturbance.  Respiratory: Negative for cough and wheezing.   Cardiovascular: Negative for chest pain, palpitations and leg swelling.  Gastrointestinal: Negative for nausea, vomiting, abdominal pain, diarrhea, constipation, blood in stool, abdominal distention, anal bleeding and rectal pain.  Genitourinary: Negative for hematuria and difficulty urinating.  Musculoskeletal: Negative for arthralgias.  Skin: Negative for rash and wound.  Neurological: Negative for seizures, syncope, weakness and headaches.  Hematological: Negative for adenopathy. Does not bruise/bleed easily.  Psychiatric/Behavioral: Negative for confusion.    Blood pressure 132/76, pulse 76, temperature 97.1 F (36.2 C), temperature source Temporal, resp. rate 16, height 5\' 10"  (1.778 m), weight 173 lb (78.472 kg).  Physical Exam Physical Exam  Vitals reviewed. Constitutional: He is oriented to person, place, and time. He appears well-developed and well-nourished. No distress.  HENT:  Head: Normocephalic and atraumatic.  Eyes: Conjunctivae normal and EOM are normal. Pupils are equal, round, and reactive to light. Left eye exhibits no discharge.  Neck: Normal range of motion. Neck supple. No tracheal deviation present. No thyromegaly present.  Cardiovascular: Normal rate, regular rhythm, normal heart sounds and intact distal pulses.  Exam reveals no gallop and no friction rub.   No murmur heard. Pulmonary/Chest: Effort normal and breath sounds normal.  No respiratory distress. He has no wheezes. He has no rales.  Abdominal: Soft. Bowel sounds are normal. He exhibits no distension and no mass. There is no tenderness. There is no rebound and no guarding.  Musculoskeletal: Normal range of motion. He exhibits no edema.  Lymphadenopathy:    He has no cervical adenopathy.    He has axillary adenopathy.       Right axillary: No pectoral and no lateral adenopathy present.       Left  axillary: Lateral adenopathy present. No pectoral adenopathy present.      Right: No inguinal and no supraclavicular adenopathy present.       Left: Inguinal adenopathy present. No supraclavicular adenopathy present.  Neurological: He is alert and oriented to person, place, and time.  Skin: Skin is warm and dry. No erythema.  Psychiatric: He has a normal mood and affect. His behavior is normal. Judgment and thought content normal.    Data Reviewed Reviewed prior notes, Dr Colin Benton notes, CT  Assessment    Progressive adenopathy, uncertain etiology    Plan    Excisional biopsy of left inguinal node. Discussed with him and reviewed issues with the difficulty of a diagnosis here. He understands and wishes to proceed       Nhan Qualley J 08/28/2012, 3:06 PM

## 2012-08-28 NOTE — Patient Instructions (Signed)
I will see you for surgery on the fifteenth.

## 2012-09-03 ENCOUNTER — Encounter (HOSPITAL_BASED_OUTPATIENT_CLINIC_OR_DEPARTMENT_OTHER): Payer: Self-pay | Admitting: *Deleted

## 2012-09-09 ENCOUNTER — Encounter (HOSPITAL_BASED_OUTPATIENT_CLINIC_OR_DEPARTMENT_OTHER): Payer: Self-pay | Admitting: *Deleted

## 2012-09-09 ENCOUNTER — Ambulatory Visit (HOSPITAL_BASED_OUTPATIENT_CLINIC_OR_DEPARTMENT_OTHER): Payer: Commercial Managed Care - PPO | Admitting: Anesthesiology

## 2012-09-09 ENCOUNTER — Encounter (HOSPITAL_BASED_OUTPATIENT_CLINIC_OR_DEPARTMENT_OTHER)
Admission: RE | Disposition: A | Discharge status: Home / Self Care | Payer: Self-pay | Source: Ambulatory Visit | Attending: Surgery

## 2012-09-09 ENCOUNTER — Encounter (HOSPITAL_BASED_OUTPATIENT_CLINIC_OR_DEPARTMENT_OTHER): Payer: Self-pay | Admitting: Anesthesiology

## 2012-09-09 ENCOUNTER — Ambulatory Visit (HOSPITAL_BASED_OUTPATIENT_CLINIC_OR_DEPARTMENT_OTHER)
Admission: RE | Admit: 2012-09-09 | Discharge: 2012-09-09 | Disposition: A | Discharge status: Home / Self Care | Payer: Commercial Managed Care - PPO | Source: Ambulatory Visit | Attending: Surgery | Admitting: Surgery

## 2012-09-09 DIAGNOSIS — R591 Generalized enlarged lymph nodes: Secondary | ICD-10-CM

## 2012-09-09 DIAGNOSIS — C8195 Hodgkin lymphoma, unspecified, lymph nodes of inguinal region and lower limb: Secondary | ICD-10-CM

## 2012-09-09 DIAGNOSIS — R599 Enlarged lymph nodes, unspecified: Secondary | ICD-10-CM | POA: Insufficient documentation

## 2012-09-09 DIAGNOSIS — C8115 Nodular sclerosis classical Hodgkin lymphoma, lymph nodes of inguinal region and lower limb: Secondary | ICD-10-CM | POA: Insufficient documentation

## 2012-09-09 HISTORY — PX: OTHER SURGICAL HISTORY: SHX169

## 2012-09-09 LAB — POCT HEMOGLOBIN-HEMACUE: Hemoglobin: 12.1 g/dL — ABNORMAL LOW (ref 13.0–17.0)

## 2012-09-09 SURGERY — BIOPSY, LYMPH NODE, INGUINAL, OPEN
Anesthesia: General | Site: Groin | Laterality: Left | Wound class: Clean

## 2012-09-09 MED ORDER — OXYCODONE HCL 5 MG/5ML PO SOLN: 5.0000 mg | Freq: Once | ORAL | Status: DC | PRN

## 2012-09-09 MED ORDER — PROPOFOL 10 MG/ML IV BOLUS
INTRAVENOUS | Status: DC | PRN
  Administered 2012-09-09: 40 mg via INTRAVENOUS
  Administered 2012-09-09: 200 mg via INTRAVENOUS

## 2012-09-09 MED ORDER — LIDOCAINE HCL (CARDIAC) 20 MG/ML IV SOLN
INTRAVENOUS | Status: DC | PRN
  Administered 2012-09-09: 75 mg via INTRAVENOUS

## 2012-09-09 MED ORDER — CEFAZOLIN SODIUM-DEXTROSE 2-3 GM-% IV SOLR
INTRAVENOUS | Status: DC | PRN
  Administered 2012-09-09: 2 g via INTRAVENOUS

## 2012-09-09 MED ORDER — FENTANYL CITRATE 0.05 MG/ML IJ SOLN
INTRAMUSCULAR | Status: DC | PRN
  Administered 2012-09-09: 100 ug via INTRAVENOUS

## 2012-09-09 MED ORDER — BUPIVACAINE HCL (PF) 0.25 % IJ SOLN
INTRAMUSCULAR | Status: DC | PRN
  Administered 2012-09-09: 5.5 mL

## 2012-09-09 MED ORDER — HYDROMORPHONE HCL PF 1 MG/ML IJ SOLN: 0.2500 mg | INTRAMUSCULAR | Status: DC | PRN

## 2012-09-09 MED ORDER — ONDANSETRON HCL 4 MG/2ML IJ SOLN
INTRAMUSCULAR | Status: DC | PRN
  Administered 2012-09-09: 4 mg via INTRAVENOUS

## 2012-09-09 MED ORDER — MIDAZOLAM HCL 5 MG/5ML IJ SOLN
INTRAMUSCULAR | Status: DC | PRN
  Administered 2012-09-09: 2 mg via INTRAVENOUS

## 2012-09-09 MED ORDER — OXYCODONE HCL 5 MG PO TABS: 5.0000 mg | ORAL_TABLET | Freq: Once | ORAL | Status: DC | PRN

## 2012-09-09 MED ORDER — DEXAMETHASONE SODIUM PHOSPHATE 4 MG/ML IJ SOLN
INTRAMUSCULAR | Status: DC | PRN
  Administered 2012-09-09: 10 mg via INTRAVENOUS

## 2012-09-09 MED ORDER — HYDROCODONE-ACETAMINOPHEN 5-325 MG PO TABS: 1.0000 | ORAL_TABLET | ORAL | Status: DC | PRN

## 2012-09-09 MED ORDER — CHLORHEXIDINE GLUCONATE 4 % EX LIQD: 1.0000 "application " | Freq: Once | CUTANEOUS | Status: DC

## 2012-09-09 MED ORDER — LACTATED RINGERS IV SOLN
INTRAVENOUS | Status: DC
  Administered 2012-09-09: 09:00:00 via INTRAVENOUS

## 2012-09-09 MED ORDER — ONDANSETRON HCL 4 MG/2ML IJ SOLN: 4.0000 mg | Freq: Once | INTRAMUSCULAR | Status: DC | PRN

## 2012-09-09 SURGICAL SUPPLY — 35 items
BLADE HEX COATED 2.75 (ELECTRODE) ×2 IMPLANT
BLADE SURG 10 STRL SS (BLADE) IMPLANT
BLADE SURG ROTATE 9660 (MISCELLANEOUS) ×2 IMPLANT
CHLORAPREP W/TINT 26ML (MISCELLANEOUS) ×2 IMPLANT
CLIP TI WIDE RED SMALL 6 (CLIP) IMPLANT
CLOTH BEACON ORANGE TIMEOUT ST (SAFETY) ×2 IMPLANT
COVER MAYO STAND STRL (DRAPES) ×2 IMPLANT
COVER TABLE BACK 60X90 (DRAPES) ×2 IMPLANT
DECANTER SPIKE VIAL GLASS SM (MISCELLANEOUS) IMPLANT
DERMABOND ADVANCED (GAUZE/BANDAGES/DRESSINGS) ×1
DERMABOND ADVANCED .7 DNX12 (GAUZE/BANDAGES/DRESSINGS) ×1 IMPLANT
DRAPE LAPAROTOMY TRNSV 102X78 (DRAPE) ×2 IMPLANT
DRAPE UTILITY XL STRL (DRAPES) ×2 IMPLANT
ELECT REM PT RETURN 9FT ADLT (ELECTROSURGICAL) ×2
ELECTRODE REM PT RTRN 9FT ADLT (ELECTROSURGICAL) ×1 IMPLANT
GLOVE BIO SURGEON STRL SZ7 (GLOVE) ×2 IMPLANT
GLOVE EUDERMIC 7 POWDERFREE (GLOVE) ×2 IMPLANT
GLOVE INDICATOR 7.0 STRL GRN (GLOVE) ×2 IMPLANT
GOWN PREVENTION PLUS XLARGE (GOWN DISPOSABLE) ×4 IMPLANT
NEEDLE HYPO 25X1 1.5 SAFETY (NEEDLE) ×2 IMPLANT
NS IRRIG 1000ML POUR BTL (IV SOLUTION) ×2 IMPLANT
PACK BASIN DAY SURGERY FS (CUSTOM PROCEDURE TRAY) ×2 IMPLANT
PENCIL BUTTON HOLSTER BLD 10FT (ELECTRODE) ×2 IMPLANT
SLEEVE SCD COMPRESS KNEE MED (MISCELLANEOUS) ×2 IMPLANT
SPONGE INTESTINAL PEANUT (DISPOSABLE) ×2 IMPLANT
SPONGE LAP 4X18 X RAY DECT (DISPOSABLE) ×2 IMPLANT
SUT MNCRL AB 4-0 PS2 18 (SUTURE) ×2 IMPLANT
SUT VIC AB 3-0 CT1 27 (SUTURE) ×1
SUT VIC AB 3-0 CT1 27XBRD (SUTURE) ×1 IMPLANT
SUT VIC AB 4-0 BRD 54 (SUTURE) IMPLANT
SYR CONTROL 10ML LL (SYRINGE) ×2 IMPLANT
TAPE HYPAFIX 4 X10 (GAUZE/BANDAGES/DRESSINGS) IMPLANT
TOWEL OR 17X24 6PK STRL BLUE (TOWEL DISPOSABLE) ×2 IMPLANT
TOWEL OR NON WOVEN STRL DISP B (DISPOSABLE) ×2 IMPLANT
WATER STERILE IRR 1000ML POUR (IV SOLUTION) IMPLANT

## 2012-09-09 NOTE — Anesthesia Procedure Notes (Signed)
Procedure Name: LMA Insertion Date/Time: 09/09/2012 9:17 AM Performed by: Burna Cash Pre-anesthesia Checklist: Patient identified, Emergency Drugs available, Suction available and Patient being monitored Patient Re-evaluated:Patient Re-evaluated prior to inductionOxygen Delivery Method: Circle System Utilized Preoxygenation: Pre-oxygenation with 100% oxygen Intubation Type: IV induction Ventilation: Mask ventilation without difficulty LMA: LMA inserted LMA Size: 5.0 Number of attempts: 1 Airway Equipment and Method: bite block Placement Confirmation: positive ETCO2 Tube secured with: Tape Dental Injury: Teeth and Oropharynx as per pre-operative assessment

## 2012-09-09 NOTE — Interval H&P Note (Signed)
History and Physical Interval Note:  09/09/2012 8:54 AM  Brian [name]  has presented today for surgery, with the diagnosis of adenopathy  The various methods of treatment have been discussed with the patient and family. After consideration of risks, benefits and other options for treatment, the patient has consented to  Procedure(s) (LRB) with comments: INGUINAL LYMPH NODE BIOPSY (Left) - Left inguinal lymph node biospy as a surgical intervention .  The patient's history has been reviewed, patient examined, no change in status, stable for surgery.  I have reviewed the patient's chart and labs.  Questions were answered to the patient's satisfaction.    The enlarged LN is marked while the patient is in pr-op.    Carley Strickling J

## 2012-09-09 NOTE — Transfer of Care (Signed)
Immediate Anesthesia Transfer of Care Note  Patient: Brian [name]  Procedure(s) Performed: Procedure(s) (LRB) with comments: INGUINAL LYMPH NODE BIOPSY (Left) - Left inguinal lymph node biospy  Patient Location: PACU  Anesthesia Type: General  Level of Consciousness: sedated  Airway & Oxygen Therapy: Patient Spontanous Breathing and Patient connected to face mask oxygen  Post-op Assessment: Report given to PACU RN and Post -op Vital signs reviewed and stable  Post vital signs: Reviewed and stable  Complications: No apparent anesthesia complications

## 2012-09-09 NOTE — H&P (View-Only) (Signed)
Patient ID: Brian [name], male   DOB: 12/21/1960, 51 y.o.   MRN: [id number]  Chief Complaint  Patient presents with  . Lymphadenopathy    Left inguinal    HPI Brian [name] is a 51 y.o. male.  Developed a new enlarged lymph node in the left inguinal area. He is seen his hematologist and they have recommended a repeat lymph node excisional biopsy. He continues to not have any specific symptoms of lymphoma. He has had a history of Bell's palsy and a ulcers of the mouth. He has had no infections in the inguinal or groin or leg area to account for his enlarged lymph node. He had a CT scan done around June that did not show this enlarged node been known to have some mild enlarged lymphadenopathy. A year ago I did a right inguinal lymph node excisional biopsy which showed dermatopathic changes but was benign. He has been followed by a hematologist since then. HPI  History reviewed. No pertinent past medical history.  Past Surgical History  Procedure Date  . Lymph node biopsy 08/16/2011    Right inguinal Dr Sung Parodi    Family History  Problem Relation Age of Onset  . Cancer Mother 74    ovarian  . Heart disease Father   . Stroke Father   . Cancer Sister     ovarian  . Cancer Brother     testicular    Social History History  Substance Use Topics  . Smoking status: Never Smoker   . Smokeless tobacco: Not on file  . Alcohol Use: Yes    No Known Allergies  Current Outpatient Prescriptions  Medication Sig Dispense Refill  . ALPRAZolam (XANAX) 0.5 MG tablet Take 1 tablet (0.5 mg total) by mouth at bedtime as needed.  30 tablet  2  . Ferrous Sulfate (IRON) 325 (65 FE) MG TABS Take by mouth 2 (two) times daily.      . Multiple Vitamin (MULTIVITAMIN) tablet Take 1 tablet by mouth daily.        Review of Systems Review of Systems  Constitutional: Negative for fever, chills and unexpected weight change.  HENT: Negative for hearing loss, congestion, sore throat, trouble swallowing and  voice change.   Eyes: Negative for visual disturbance.  Respiratory: Negative for cough and wheezing.   Cardiovascular: Negative for chest pain, palpitations and leg swelling.  Gastrointestinal: Negative for nausea, vomiting, abdominal pain, diarrhea, constipation, blood in stool, abdominal distention, anal bleeding and rectal pain.  Genitourinary: Negative for hematuria and difficulty urinating.  Musculoskeletal: Negative for arthralgias.  Skin: Negative for rash and wound.  Neurological: Negative for seizures, syncope, weakness and headaches.  Hematological: Negative for adenopathy. Does not bruise/bleed easily.  Psychiatric/Behavioral: Negative for confusion.    Blood pressure 132/76, pulse 76, temperature 97.1 F (36.2 C), temperature source Temporal, resp. rate 16, height 5' 10" (1.778 m), weight 173 lb (78.472 kg).  Physical Exam Physical Exam  Vitals reviewed. Constitutional: He is oriented to person, place, and time. He appears well-developed and well-nourished. No distress.  HENT:  Head: Normocephalic and atraumatic.  Eyes: Conjunctivae normal and EOM are normal. Pupils are equal, round, and reactive to light. Left eye exhibits no discharge.  Neck: Normal range of motion. Neck supple. No tracheal deviation present. No thyromegaly present.  Cardiovascular: Normal rate, regular rhythm, normal heart sounds and intact distal pulses.  Exam reveals no gallop and no friction rub.   No murmur heard. Pulmonary/Chest: Effort normal and breath sounds normal.   No respiratory distress. He has no wheezes. He has no rales.  Abdominal: Soft. Bowel sounds are normal. He exhibits no distension and no mass. There is no tenderness. There is no rebound and no guarding.  Musculoskeletal: Normal range of motion. He exhibits no edema.  Lymphadenopathy:    He has no cervical adenopathy.    He has axillary adenopathy.       Right axillary: No pectoral and no lateral adenopathy present.       Left  axillary: Lateral adenopathy present. No pectoral adenopathy present.      Right: No inguinal and no supraclavicular adenopathy present.       Left: Inguinal adenopathy present. No supraclavicular adenopathy present.  Neurological: He is alert and oriented to person, place, and time.  Skin: Skin is warm and dry. No erythema.  Psychiatric: He has a normal mood and affect. His behavior is normal. Judgment and thought content normal.    Data Reviewed Reviewed prior notes, Dr Ennevar notes, CT  Assessment    Progressive adenopathy, uncertain etiology    Plan    Excisional biopsy of left inguinal node. Discussed with him and reviewed issues with the difficulty of a diagnosis here. He understands and wishes to proceed       Joey Hudock J 08/28/2012, 3:06 PM    

## 2012-09-09 NOTE — Op Note (Signed)
Brian [name] [birth year] [id number] 08/28/2012  Preoperative diagnosis: adenopathy of uncertain etiology  Postoperative diagnosis: same  Procedure: excision of left inguinal lymph node, superficial  Surgeon: [name], MD, FACS  Assistant: none  Anesthesia: General   Clinical History and Indications: this patient presented over a year ago with some adenopathy. An enlarged right angle lobe was biopsied and was benign but a definite etiology was not confirmed. He subsequently developed additional adenopathy now with a significantly large node in the left inguinal area. He is being followed by an oncologist who recommended that this be excised for biopsy purposes.    Description of Procedure: I saw the patient preoperative area and reviewed the plannes. The lymph node was marked as it was visible and palpable easily.the patient had no further questions.  The patient taken to the operating room after satisfactory anesthesia the left inguinal area was clipped prepped and draped. A time out was performed.  I injected some 0.25% plain Marcaine over the lymph node and around it. I made a transverse incision and divided Scarpa's. The node was immediately visible. It was excised, dividing the tissue attached it with either cautery or a sign counter multiple small lymphatics these were clamped and tied. The node was brought out intact and in toto. There is no significant bleeding.  I make sure everything was dry and then closed with some 3-0 Vicryl on Scarpa's and 4-0 Monocryl subcuticular plus Dermabond on the skin.  The patient tolerated the procedure well. There were no operative complications. All counts were correct.    [name], MD, FACS 09/09/2012 9:49 AM

## 2012-09-09 NOTE — Anesthesia Postprocedure Evaluation (Signed)
  Anesthesia Post-op Note  Patient: Brian [name]  Procedure(s) Performed: Procedure(s) (LRB) with comments: INGUINAL LYMPH NODE BIOPSY (Left) - Left inguinal lymph node biospy  Patient Location: PACU  Anesthesia Type: General  Level of Consciousness: awake, alert  and oriented  Airway and Oxygen Therapy: Patient Spontanous Breathing and Patient connected to face mask oxygen  Post-op Pain: mild  Post-op Assessment: Post-op Vital signs reviewed  Post-op Vital Signs: Reviewed  Complications: No apparent anesthesia complications

## 2012-09-09 NOTE — Anesthesia Preprocedure Evaluation (Signed)
Anesthesia Evaluation  Patient identified by MRN, date of birth, ID band Patient awake    Reviewed: Allergy & Precautions, H&P , NPO status , Patient's Chart, lab work & pertinent test results  Airway Mallampati: I TM Distance: >3 FB Neck ROM: Full    Dental  (+) Teeth Intact and Dental Advisory Given   Pulmonary  breath sounds clear to auscultation        Cardiovascular Rhythm:Regular Rate:Normal     Neuro/Psych    GI/Hepatic   Endo/Other    Renal/GU      Musculoskeletal   Abdominal   Peds  Hematology   Anesthesia Other Findings   Reproductive/Obstetrics                           Anesthesia Physical Anesthesia Plan  ASA: II  Anesthesia Plan: General   Post-op Pain Management:    Induction: Intravenous  Airway Management Planned: LMA  Additional Equipment:   Intra-op Plan:   Post-operative Plan: Extubation in OR  Informed Consent: I have reviewed the patients History and Physical, chart, labs and discussed the procedure including the risks, benefits and alternatives for the proposed anesthesia with the patient or authorized representative who has indicated his/her understanding and acceptance.   Dental advisory given  Plan Discussed with: CRNA, Anesthesiologist and Surgeon  Anesthesia Plan Comments:         Anesthesia Quick Evaluation  

## 2012-09-11 ENCOUNTER — Telehealth (INDEPENDENT_AMBULATORY_CARE_PROVIDER_SITE_OTHER): Payer: Self-pay | Admitting: General Surgery

## 2012-09-11 NOTE — Telephone Encounter (Signed)
Patient made aware I do not have a pathology report yet and will call him when I do.

## 2012-09-11 NOTE — Telephone Encounter (Signed)
Message copied by Liliana Cline on Thu Sep 11, 2012  4:04 PM ------      Message from: Marnette Burgess      Created: Thu Sep 11, 2012  3:01 PM      Contact: [phone]       Patient called about his lab results, please call.

## 2012-09-16 ENCOUNTER — Telehealth (INDEPENDENT_AMBULATORY_CARE_PROVIDER_SITE_OTHER): Payer: Self-pay | Admitting: Surgery

## 2012-09-16 NOTE — Telephone Encounter (Signed)
Called him with his path report today - Hodgkins - he will be planning to follow up with Dr Colin Benton

## 2012-09-17 ENCOUNTER — Telehealth: Payer: Self-pay | Admitting: Hematology & Oncology

## 2012-09-17 ENCOUNTER — Other Ambulatory Visit: Payer: Self-pay | Admitting: Medical

## 2012-09-17 DIAGNOSIS — C8190 Hodgkin lymphoma, unspecified, unspecified site: Secondary | ICD-10-CM

## 2012-09-17 NOTE — Telephone Encounter (Signed)
Pt aware of 10-30 BMBX at [facility] at 730 am and to be NPO after midnight. He is also aware or 2D Echo and PET on 10-31 and to be NPO 6 hrs prior

## 2012-09-17 NOTE — Progress Notes (Signed)
Our office received a call from Dr. [name], who reports that he made a phone call to Mr. Dufresne regarding his pathology report.  He informed Mr. Wainer that the pathology revealed Hodgkin's lymphoma.  I went ahead and set the patient up for a staging PET scan, 2-D echo, and bone marrow, biopsy with flow cytometry and cytogenetics to be done by interventional radiology.  I spoke with Inetta Fermo in interventional radiology, who states that earliest the bone marrow biopsy can be done is next Wednesday.  If availability opens before then she will let the patient know.  I personally spoke with Mr. Selders, and informed him of what was being set up and what he could expect.  We will be back in touch with Mr. Halterman once we get the results back.  He voiced his understanding.  I told him to give [place] a call if he had any more questions or concerns.

## 2012-09-19 ENCOUNTER — Other Ambulatory Visit: Payer: Self-pay | Admitting: Radiology

## 2012-09-19 ENCOUNTER — Encounter (HOSPITAL_COMMUNITY): Payer: Self-pay | Admitting: Pharmacy Technician

## 2012-09-23 ENCOUNTER — Encounter (INDEPENDENT_AMBULATORY_CARE_PROVIDER_SITE_OTHER): Payer: Self-pay | Admitting: Surgery

## 2012-09-23 ENCOUNTER — Ambulatory Visit (INDEPENDENT_AMBULATORY_CARE_PROVIDER_SITE_OTHER): Payer: Commercial Managed Care - PPO | Admitting: Surgery

## 2012-09-23 VITALS — BP 142/82 | HR 75 | Temp 98.0°F | Resp 18 | Ht 70.0 in | Wt 174.6 lb

## 2012-09-23 DIAGNOSIS — Z09 Encounter for follow-up examination after completed treatment for conditions other than malignant neoplasm: Secondary | ICD-10-CM

## 2012-09-23 NOTE — Patient Instructions (Signed)
We will see you again on an as needed basis. Please call the office at [phone] if you have any questions or concerns. Thank you for allowing us to take care of you.  

## 2012-09-23 NOTE — Progress Notes (Signed)
NAME: Brian [name]                                            DOB: 19-Jul-1961 DATE: 09/23/2012                                                  MRN: [id number]  CC: Post op   HPI: This patient comes in for post op follow-up .Heunderwent left inguinal lymph node biopsy on 09/09/12. He feels that he is doing well.he notes some occasional swelling which will get larger and then gets smaller in area of incision. There is no significant pain. There's been no redness or signs of infection.  PE:  VITAL SIGNS: BP 142/82  Pulse 75  Temp 98 F (36.7 C) (Oral)  Resp 18  Ht 5\' 10"  (1.778 m)  Wt 174 lb 9.6 oz (79.198 kg)  BMI 25.05 kg/m2  General: The patient appears to be healthy, NAD Incision: Healing nicely with no evidence of infection. Question a small seroma.  DATA REVIEWED: Pathology report noted and shows Hodgkin's  IMPRESSION: The patient is doing well S/P inguinal lymph node excisional biopsy.    PLAN: I will see back here when necessary. I gave him a copy of the pathology report. He already has medical oncology visit scheduled for a bone marrow PET scan etc.

## 2012-09-24 ENCOUNTER — Ambulatory Visit (HOSPITAL_COMMUNITY)
Admission: RE | Admit: 2012-09-24 | Discharge: 2012-09-24 | Disposition: A | Discharge status: Home / Self Care | Payer: Commercial Managed Care - PPO | Source: Ambulatory Visit | Attending: Medical | Admitting: Medical

## 2012-09-24 ENCOUNTER — Ambulatory Visit (HOSPITAL_COMMUNITY)
Admission: RE | Admit: 2012-09-24 | Discharge: 2012-09-24 | Disposition: A | Discharge status: Home / Self Care | Payer: Commercial Managed Care - PPO | Source: Ambulatory Visit | Attending: Hematology & Oncology | Admitting: Hematology & Oncology

## 2012-09-24 DIAGNOSIS — C8190 Hodgkin lymphoma, unspecified, unspecified site: Secondary | ICD-10-CM

## 2012-09-24 DIAGNOSIS — R599 Enlarged lymph nodes, unspecified: Secondary | ICD-10-CM | POA: Insufficient documentation

## 2012-09-24 LAB — PROTIME-INR: INR: 1.11 (ref 0.00–1.49)

## 2012-09-24 LAB — CBC
HCT: 37.7 % — ABNORMAL LOW (ref 39.0–52.0)
Hemoglobin: 11.8 g/dL — ABNORMAL LOW (ref 13.0–17.0)
MCV: 80.4 fL (ref 78.0–100.0)
RBC: 4.69 MIL/uL (ref 4.22–5.81)
RDW: 15.4 % (ref 11.5–15.5)
WBC: 6.9 10*3/uL (ref 4.0–10.5)

## 2012-09-24 LAB — APTT: aPTT: 30 seconds (ref 24–37)

## 2012-09-24 MED ORDER — HYDROCODONE-ACETAMINOPHEN 5-325 MG PO TABS
1.0000 | ORAL_TABLET | ORAL | Status: DC | PRN
  Filled 2012-09-24: qty 2

## 2012-09-24 MED ORDER — MIDAZOLAM HCL 2 MG/2ML IJ SOLN
INTRAMUSCULAR | Status: AC | PRN
  Administered 2012-09-24 (×2): 1 mg via INTRAVENOUS

## 2012-09-24 MED ORDER — MIDAZOLAM HCL 2 MG/2ML IJ SOLN
INTRAMUSCULAR | Status: AC
  Filled 2012-09-24: qty 4

## 2012-09-24 MED ORDER — SODIUM CHLORIDE 0.9 % IV SOLN: INTRAVENOUS | Status: DC

## 2012-09-24 MED ORDER — FENTANYL CITRATE 0.05 MG/ML IJ SOLN
INTRAMUSCULAR | Status: AC
  Filled 2012-09-24: qty 4

## 2012-09-24 MED ORDER — FENTANYL CITRATE 0.05 MG/ML IJ SOLN
INTRAMUSCULAR | Status: AC
  Filled 2012-09-24: qty 6

## 2012-09-24 MED ORDER — FENTANYL CITRATE 0.05 MG/ML IJ SOLN
INTRAMUSCULAR | Status: AC | PRN
  Administered 2012-09-24 (×2): 50 ug via INTRAVENOUS

## 2012-09-24 NOTE — H&P (Signed)
Agree with PA note.  Will proceed with bone marrow biopsy under CT guidance.  Signed,  Sterling Big, MD Vascular & Interventional Radiologist [facility]

## 2012-09-24 NOTE — Procedures (Signed)
Interventional Radiology Procedure Note  Procedure: CT guided aspirate and core biopsy of right iliac bone Complications: None Recommendations: - Bedrest supine x 3 hrs - Hydrocodone PRN  Pain - Follow biopsy results  Signed,  Heath K. McCullough, MD Vascular & Interventional Radiologist [facility]  

## 2012-09-24 NOTE — H&P (Signed)
Brian [name] is an 51 y.o. male.   Chief Complaint: "I'm here for a bone marrow biopsy" HPI: Patient with history of Hodgkin's lymphoma presents today for CT guided bone marrow biopsy.  PMH: Hodgkin's disease, Bell's palsy  Past Surgical History  Procedure Date  . Lymph node biopsy 08/16/2011    Right inguinal Dr [name]  . Cystectomy 1988    from neck    Family History  Problem Relation Age of Onset  . Cancer Mother 47    ovarian  . Heart disease Father   . Stroke Father   . Cancer Sister     ovarian  . Cancer Brother     testicular   Social History:  reports that he has never smoked. He does not have any smokeless tobacco history on file. He reports that he drinks alcohol. He reports that he does not use illicit drugs.  Allergies: No Known Allergies  Current outpatient prescriptions:ALPRAZolam (XANAX) 0.5 MG tablet, Take 1 tablet (0.5 mg total) by mouth at bedtime as needed., Disp: 30 tablet, Rfl: 2;  Ferrous Sulfate (IRON) 325 (65 FE) MG TABS, Take by mouth 2 (two) times daily., Disp: , Rfl: ;  HYDROcodone-acetaminophen (NORCO) 5-325 MG per tablet, Take 1 tablet by mouth every 4 (four) hours as needed for pain., Disp: 10 tablet, Rfl: 0 Multiple Vitamin (MULTIVITAMIN) tablet, Take 1 tablet by mouth daily., Disp: , Rfl:  Current facility-administered medications:0.9 %  sodium chloride infusion, , Intravenous, Continuous, D Jeananne Rama, PA   Results for orders placed during the hospital encounter of 09/24/12 (from the past 48 hour(s))  CBC     Status: Abnormal   Collection Time   09/24/12  7:56 AM      Component Value Range Comment   WBC 6.9  4.0 - 10.5 K/uL    RBC 4.69  4.22 - 5.81 MIL/uL    Hemoglobin 11.8 (*) 13.0 - 17.0 g/dL    HCT 16.1 (*) 09.6 - 52.0 %    MCV 80.4  78.0 - 100.0 fL    MCH 25.2 (*) 26.0 - 34.0 pg    MCHC 31.3  30.0 - 36.0 g/dL    RDW 04.5  40.9 - 81.1 %    Platelets 385  150 - 400 K/uL    Results for orders placed during the hospital encounter of  09/24/12  CBC      Component Value Range   WBC 6.9  4.0 - 10.5 K/uL   RBC 4.69  4.22 - 5.81 MIL/uL   Hemoglobin 11.8 (*) 13.0 - 17.0 g/dL   HCT 91.4 (*) 78.2 - 95.6 %   MCV 80.4  78.0 - 100.0 fL   MCH 25.2 (*) 26.0 - 34.0 pg   MCHC 31.3  30.0 - 36.0 g/dL   RDW 21.3  08.6 - 57.8 %   Platelets 385  150 - 400 K/uL    Review of Systems  Constitutional: Positive for weight loss. Negative for fever and chills.  HENT:       Occ HA's  Respiratory: Negative for cough and shortness of breath.   Cardiovascular: Negative for chest pain.  Gastrointestinal: Negative for nausea, vomiting and abdominal pain.  Musculoskeletal: Positive for back pain.    Blood pressure 132/83, pulse 71, temperature 98 F (36.7 C), temperature source Oral, resp. rate 21, SpO2 96.00%. Physical Exam  Constitutional: He is oriented to person, place, and time. He appears well-developed and well-nourished.  Cardiovascular: Normal rate and regular rhythm.  Respiratory: Effort normal and breath sounds normal.  GI: Soft. Bowel sounds are normal. There is no tenderness.  Musculoskeletal: Normal range of motion. He exhibits no edema.  Neurological: He is alert and oriented to person, place, and time.     Assessment/Plan Patient with hx of Hodgkin's lymphoma. Plan is for CT guided bone marrow biopsy today. Details/risks of procedure d/w pt/wife with their understanding and consent.  ALLRED,D KEVIN 09/24/2012, 8:23 AM

## 2012-09-25 ENCOUNTER — Ambulatory Visit (HOSPITAL_COMMUNITY)
Admission: RE | Admit: 2012-09-25 | Discharge: 2012-09-25 | Disposition: A | Discharge status: Home / Self Care | Payer: Commercial Managed Care - PPO | Source: Ambulatory Visit | Attending: Internal Medicine | Admitting: Internal Medicine

## 2012-09-25 ENCOUNTER — Encounter (HOSPITAL_COMMUNITY)
Admission: RE | Admit: 2012-09-25 | Discharge: 2012-09-25 | Disposition: A | Discharge status: Home / Self Care | Payer: Commercial Managed Care - PPO | Source: Ambulatory Visit | Attending: Medical | Admitting: Medical

## 2012-09-25 ENCOUNTER — Other Ambulatory Visit: Payer: Self-pay | Admitting: Hematology & Oncology

## 2012-09-25 ENCOUNTER — Other Ambulatory Visit: Payer: Self-pay | Admitting: Medical

## 2012-09-25 DIAGNOSIS — I517 Cardiomegaly: Secondary | ICD-10-CM

## 2012-09-25 DIAGNOSIS — C8195 Hodgkin lymphoma, unspecified, lymph nodes of inguinal region and lower limb: Secondary | ICD-10-CM

## 2012-09-25 DIAGNOSIS — C8190 Hodgkin lymphoma, unspecified, unspecified site: Secondary | ICD-10-CM | POA: Insufficient documentation

## 2012-09-25 LAB — GLUCOSE, CAPILLARY: Glucose-Capillary: 96 mg/dL (ref 70–99)

## 2012-09-25 MED ORDER — FLUDEOXYGLUCOSE F - 18 (FDG) INJECTION
15.1000 | Freq: Once | INTRAVENOUS | Status: AC | PRN
  Administered 2012-09-25: 15.1 via INTRAVENOUS

## 2012-09-25 NOTE — Progress Notes (Signed)
*  PRELIMINARY RESULTS* Echocardiogram 2D Echocardiogram has been performed.  Brian [name] 09/25/2012, 9:46 AM

## 2012-09-26 ENCOUNTER — Other Ambulatory Visit: Payer: Self-pay | Admitting: Radiology

## 2012-09-26 ENCOUNTER — Telehealth: Payer: Self-pay | Admitting: Hematology & Oncology

## 2012-09-26 ENCOUNTER — Encounter (HOSPITAL_COMMUNITY): Payer: Self-pay | Admitting: Pharmacy Technician

## 2012-09-26 NOTE — Telephone Encounter (Signed)
Pt aware of 11-5 and 11-6 appointments

## 2012-09-30 ENCOUNTER — Ambulatory Visit (HOSPITAL_COMMUNITY)
Admission: RE | Admit: 2012-09-30 | Discharge: 2012-09-30 | Disposition: A | Discharge status: Home / Self Care | Payer: Commercial Managed Care - PPO | Source: Ambulatory Visit | Attending: Hematology & Oncology | Admitting: Hematology & Oncology

## 2012-09-30 ENCOUNTER — Ambulatory Visit (HOSPITAL_BASED_OUTPATIENT_CLINIC_OR_DEPARTMENT_OTHER): Payer: Commercial Managed Care - PPO | Admitting: Hematology & Oncology

## 2012-09-30 VITALS — BP 134/75 | HR 67 | Temp 98.8°F | Resp 18 | Ht 70.0 in | Wt 174.0 lb

## 2012-09-30 DIAGNOSIS — C8190 Hodgkin lymphoma, unspecified, unspecified site: Secondary | ICD-10-CM

## 2012-09-30 DIAGNOSIS — C8195 Hodgkin lymphoma, unspecified, lymph nodes of inguinal region and lower limb: Secondary | ICD-10-CM | POA: Insufficient documentation

## 2012-09-30 DIAGNOSIS — C8118 Nodular sclerosis classical Hodgkin lymphoma, lymph nodes of multiple sites: Secondary | ICD-10-CM

## 2012-09-30 LAB — PULMONARY FUNCTION TEST

## 2012-09-30 MED ORDER — ALBUTEROL SULFATE (5 MG/ML) 0.5% IN NEBU
2.5000 mg | INHALATION_SOLUTION | Freq: Once | RESPIRATORY_TRACT | Status: AC
  Administered 2012-09-30: 2.5 mg via RESPIRATORY_TRACT

## 2012-09-30 NOTE — Patient Instructions (Addendum)
Please review the following information:  Doxorubicin injection  What is this medicine?  DOXORUBICIN (dox oh ROO bi sin) is a chemotherapy drug. It is used to treat many kinds of cancer like Hodgkin's disease, leukemia, non-Hodgkin's lymphoma, neuroblastoma, sarcoma, and Wilms' tumor. It is also used to treat bladder cancer, breast cancer, lung cancer, ovarian cancer, stomach cancer, and thyroid cancer. This medicine may be used for other purposes; ask your health care provider or pharmacist if you have questions.  What should I tell my health care provider before I take this medicine?  They need to know if you have any of these conditions: -blood disorders -heart disease, recent heart attack -infection (especially a virus infection such as chickenpox, cold sores, or herpes) -irregular heartbeat -liver disease -recent or ongoing radiation therapy -an unusual or allergic reaction to doxorubicin, other chemotherapy agents, other medicines, foods, dyes, or preservatives -pregnant or trying to get pregnant -breast-feeding  How should I use this medicine?  This drug is given as an infusion into a vein. It is administered in a hospital or clinic by a specially trained health care professional. If you have pain, swelling, burning or any unusual feeling around the site of your injection, tell your health care professional right away. Talk to your pediatrician regarding the use of this medicine in children. Special care may be needed. Overdosage: If you think you have taken too much of this medicine contact a poison control center or emergency room at once. NOTE: This medicine is only for you. Do not share this medicine with others.  What if I miss a dose?  It is important not to miss your dose. Call your doctor or health care professional if you are unable to keep an appointment. What may interact with this medicine? Do not take this medicine with any of the following  medications: -cisapride -droperidol -halofantrine -pimozide -zidovudine This medicine may also interact with the following medications: -chloroquine -chlorpromazine -clarithromycin -cyclophosphamide -cyclosporine -erythromycin -medicines for depression, anxiety, or psychotic disturbances -medicines for irregular heart beat like amiodarone, bepridil, dofetilide, encainide, flecainide, propafenone, quinidine -medicines for seizures like ethotoin, fosphenytoin, phenytoin -medicines for nausea, vomiting like dolasetron, ondansetron, palonosetron -medicines to increase blood counts like filgrastim, pegfilgrastim, sargramostim -methadone -methotrexate -pentamidine -progesterone -vaccines -verapamil Talk to your doctor or health care professional before taking any of these medicines: -acetaminophen -aspirin -ibuprofen -ketoprofen -naproxen This list may not describe all possible interactions. Give your health care provider a list of all the medicines, herbs, non-prescription drugs, or dietary supplements you use. Also tell them if you smoke, drink alcohol, or use illegal drugs. Some items may interact with your medicine.  What should I watch for while using this medicine?  Your condition will be monitored carefully while you are receiving this medicine. You will need important blood work done while you are taking this medicine. This drug may make you feel generally unwell. This is not uncommon, as chemotherapy can affect healthy cells as well as cancer cells. Report any side effects. Continue your course of treatment even though you feel ill unless your doctor tells you to stop. Your urine may turn red for a few days after your dose. This is not blood. If your urine is dark or brown, call your doctor. In some cases, you may be given additional medicines to help with side effects. Follow all directions for their use. Call your doctor or health care professional for advice if you get a  fever, chills or sore throat, or other symptoms  of a cold or flu. Do not treat yourself. This drug decreases your body's ability to fight infections. Try to avoid being around people who are sick. This medicine may increase your risk to bruise or bleed. Call your doctor or health care professional if you notice any unusual bleeding. Be careful brushing and flossing your teeth or using a toothpick because you may get an infection or bleed more easily. If you have any dental work done, tell your dentist you are receiving this medicine. Avoid taking products that contain aspirin, acetaminophen, ibuprofen, naproxen, or ketoprofen unless instructed by your doctor. These medicines may hide a fever. Men and women of childbearing age should use effective birth control methods while using taking this medicine. Do not become pregnant while taking this medicine. There is a potential for serious side effects to an unborn child. Talk to your health care professional or pharmacist for more information. Do not breast-feed an infant while taking this medicine. Do not let others touch your urine or other body fluids for 5 days after each treatment with this medicine. Caregivers should wear latex gloves to avoid touching body fluids during this time.  What side effects may I notice from receiving this medicine?  Side effects that you should report to your doctor or health care professional as soon as possible: -allergic reactions like skin rash, itching or hives, swelling of the face, lips, or tongue -low blood counts - this medicine may decrease the number of white blood cells, red blood cells and platelets. You may be at increased risk for infections and bleeding. -signs of infection - fever or chills, cough, sore throat, pain or difficulty passing urine -signs of decreased platelets or bleeding - bruising, pinpoint red spots on the skin, black, tarry stools, blood in the urine -signs of decreased red blood cells -  unusually weak or tired, fainting spells, lightheadedness -breathing problems -chest pain -fast, irregular heartbeat -mouth sores -nausea, vomiting -pain, swelling, redness at site where injected -pain, tingling, numbness in the hands or feet -swelling of ankles, feet, or hands -unusual bleeding or bruising Side effects that usually do not require medical attention (report to your doctor or health care professional if they continue or are bothersome): -diarrhea -facial flushing -hair loss -loss of appetite -missed menstrual periods -nail discoloration or damage -red or watery eyes -red colored urine -stomach upset This list may not describe all possible side effects. Call your doctor for medical advice about side effects. You may report side effects to FDA at 1-800-FDA-1088.   Bleomycin injection  What is this medicine?  BLEOMYCIN (blee oh MYE sin) is a chemotherapy drug. It is used to treat many kinds of cancer like lymphoma, cervical cancer, head and neck cancer, and testicular cancer. It is also used to prevent and to treat fluid build-up around the lungs caused by some cancers. This medicine may be used for other purposes; ask your health care provider or pharmacist if you have questions.  What should I tell my health care provider before I take this medicine?  They need to know if you have any of these conditions: -cigarette smoker -kidney disease -lung disease -recent or ongoing radiation therapy -an unusual or allergic reaction to bleomycin, other chemotherapy agents, other medicines, foods, dyes, or preservatives -pregnant or trying to get pregnant -breast-feeding  How should I use this medicine?  This drug is given as an infusion into a vein or a body cavity. It can also be given as an injection into a  muscle or under the skin. It is administered in a hospital or clinic by a specially trained health care professional. Talk to your pediatrician regarding the use of  this medicine in children. Special care may be needed. Overdosage: If you think you have taken too much of this medicine contact a poison control center or emergency room at once. NOTE: This medicine is only for you. Do not share this medicine with others.  What if I miss a dose?  It is important not to miss your dose. Call your doctor or health care professional if you are unable to keep an appointment. What may interact with this medicine? -certain antibiotics given by injection -cisplatin -cyclosporine -diuretics -foscarnet -medicines to increase blood counts like filgrastim, pegfilgrastim, sargramostim -vaccines This list may not describe all possible interactions. Give your health care provider a list of all the medicines, herbs, non-prescription drugs, or dietary supplements you use. Also tell them if you smoke, drink alcohol, or use illegal drugs. Some items may interact with your medicine.  What should I watch for while using this medicine?  Visit your doctor for checks on your progress. This drug may make you feel generally unwell. This is not uncommon, as chemotherapy can affect healthy cells as well as cancer cells. Report any side effects. Continue your course of treatment even though you feel ill unless your doctor tells you to stop. Call your doctor or health care professional for advice if you get a fever, chills or sore throat, or other symptoms of a cold or flu. Do not treat yourself. This drug decreases your body's ability to fight infections. Try to avoid being around people who are sick. Avoid taking products that contain aspirin, acetaminophen, ibuprofen, naproxen, or ketoprofen unless instructed by your doctor. These medicines may hide a fever. Do not become pregnant while taking this medicine. Women should inform their doctor if they wish to become pregnant or think they might be pregnant. There is a potential for serious side effects to an unborn child. Talk to your  health care professional or pharmacist for more information. Do not breast-feed an infant while taking this medicine.  What side effects may I notice from receiving this medicine?  Side effects that you should report to your doctor or health care professional as soon as possible: -allergic reactions like skin rash, itching or hives, swelling of the face, lips, or tongue -breathing problems -chest pain -confusion -cough -fast, irregular heartbeat -feeling faint or lightheaded, falls -fever or chills -mouth sores -pain, tingling, numbness in the hands or feet -trouble passing urine or change in the amount of urine -yellowing of the eyes or skin Side effects that usually do not require medical attention (report to your doctor or health care professional if they continue or are bothersome): -darker skin color -hair loss -irritation at site where injected -loss of appetite -nail changes -nausea and vomiting -weight loss This list may not describe all possible side effects. Call your doctor for medical advice about side effects. You may report side effects to FDA at 1-800-FDA-1088.   Vincristine injection  What is this medicine?  VINCRISTINE (vin KRIS teen) is a chemotherapy drug. It slows the growth of cancer cells. This medicine is used to treat many types of cancer like Hodgkin's disease, leukemia, non-Hodgkin's lymphoma, neuroblastoma (brain cancer), rhabdomyosarcoma, and Wilms' tumor. This medicine may be used for other purposes; ask your health care provider or pharmacist if you have questions.  What should I tell my health care provider  before I take this medicine?  They need to know if you have any of these conditions: -blood disorders -gout -infection (especially chickenpox, cold sores, or herpes) -kidney disease -liver disease -lung disease -nervous system disease like Charcot-Marie-Tooth (CMT) -recent or ongoing radiation therapy -an unusual or allergic reaction to  vincristine, other chemotherapy agents, other medicines, foods, dyes, or preservatives -pregnant or trying to get pregnant -breast-feeding  How should I use this medicine?  This drug is given as an infusion into a vein. It is administered in a hospital or clinic by a specially trained health care professional. If you have pain, swelling, burning, or any unusual feeling around the site of your injection, tell your health care professional right away. Talk to your pediatrician regarding the use of this medicine in children. While this drug may be prescribed for selected conditions, precautions do apply. Overdosage: If you think you have taken too much of this medicine contact a poison control center or emergency room at once. NOTE: This medicine is only for you. Do not share this medicine with others.  What if I miss a dose?  It is important not to miss your dose. Call your doctor or health care professional if you are unable to keep an appointment. What may interact with this medicine? Do not take this medicine with any of the following medications: -itraconazole -mibefradil -voriconazole This medicine may also interact with the following medications: -cyclosporine -erythromycin -fluconazole -ketoconazole -medicines for HIV like delavirdine, efavirenz, nevirapine -medicines for seizures like ethotoin, fosphenotoin, phenytoin -medicines to increase blood counts like filgrastim, pegfilgrastim, sargramostim -other chemotherapy drugs like cisplatin, L-asparaginase, methotrexate, mitomycin, paclitaxel -pegaspargase -vaccines -zalcitabine, ddC Talk to your doctor or health care professional before taking any of these medicines: -acetaminophen -aspirin -ibuprofen -ketoprofen -naproxen This list may not describe all possible interactions. Give your health care provider a list of all the medicines, herbs, non-prescription drugs, or dietary supplements you use. Also tell them if you smoke,  drink alcohol, or use illegal drugs. Some items may interact with your medicine.  What should I watch for while using this medicine?  Your condition will be monitored carefully while you are receiving this medicine. You will need important blood work done while you are taking this medicine. This drug may make you feel generally unwell. This is not uncommon, as chemotherapy can affect healthy cells as well as cancer cells. Report any side effects. Continue your course of treatment even though you feel ill unless your doctor tells you to stop. In some cases, you may be given additional medicines to help with side effects. Follow all directions for their use. Call your doctor or health care professional for advice if you get a fever, chills or sore throat, or other symptoms of a cold or flu. Do not treat yourself. Avoid taking products that contain aspirin, acetaminophen, ibuprofen, naproxen, or ketoprofen unless instructed by your doctor. These medicines may hide a fever. Do not become pregnant while taking this medicine. Women should inform their doctor if they wish to become pregnant or think they might be pregnant. There is a potential for serious side effects to an unborn child. Talk to your health care professional or pharmacist for more information. Do not breast-feed an infant while taking this medicine. Men may have a lower sperm count while taking this medicine. Talk to your doctor if you plan to father a child.  What side effects may I notice from receiving this medicine?  Side effects that you should report to  your doctor or health care professional as soon as possible: -allergic reactions like skin rash, itching or hives, swelling of the face, lips, or tongue -breathing problems -confusion or changes in emotions or moods -constipation -cough -mouth sores -muscle weakness -nausea and vomiting -pain, swelling, redness or irritation at the injection site -pain, tingling, numbness in  the hands or feet -problems with balance, talking, walking -seizures -stomach pain -trouble passing urine or change in the amount of urine Side effects that usually do not require medical attention (report to your doctor or health care professional if they continue or are bothersome): -diarrhea -hair loss -jaw pain -loss of appetite This list may not describe all possible side effects. Call your doctor for medical advice about side effects. You may report side effects to FDA at 1-800-FDA-1088.   Dacarbazine, DTIC injection  What is this medicine?  DACARBAZINE (da KAR ba zeen) is a chemotherapy drug. This medicine is used to treat skin cancer. It is also used with other medicines to treat Hodgkin's disease. This medicine may be used for other purposes; ask your health care provider or pharmacist if you have questions.  What should I tell my health care provider before I take this medicine?  They need to know if you have any of these conditions: -infection (especially virus infection such as chickenpox, cold sores, or herpes) -kidney disease -liver disease -low blood counts like low platelets, red blood cells, white blood cells -recent radiation therapy -an unusual or allergic reaction to dacarbazine, other chemotherapy agents, other medicines, foods, dyes, or preservatives -pregnant or trying to get pregnant -breast-feeding  How should I use this medicine?  This drug is given as an injection or infusion into a vein. It is administered in a hospital or clinic by a specially trained health care professional. Talk to your pediatrician regarding the use of this medicine in children. While this drug may be prescribed for selected conditions, precautions do apply. Overdosage: If you think you have taken too much of this medicine contact a poison control center or emergency room at once. NOTE: This medicine is only for you. Do not share this medicine with others.  What if I miss a  dose?  It is important not to miss your dose. Call your doctor or health care professional if you are unable to keep an appointment. What may interact with this medicine? -medicines to increase blood counts like filgrastim, pegfilgrastim, sargramostim -vaccines This list may not describe all possible interactions. Give your health care provider a list of all the medicines, herbs, non-prescription drugs, or dietary supplements you use. Also tell them if you smoke, drink alcohol, or use illegal drugs. Some items may interact with your medicine.  What should I watch for while using this medicine?  Your condition will be monitored carefully while you are receiving this medicine. You will need important blood work done while you are taking this medicine. This drug may make you feel generally unwell. This is not uncommon, as chemotherapy can affect healthy cells as well as cancer cells. Report any side effects. Continue your course of treatment even though you feel ill unless your doctor tells you to stop. Call your doctor or health care professional for advice if you get a fever, chills or sore throat, or other symptoms of a cold or flu. Do not treat yourself. This drug decreases your body's ability to fight infections. Try to avoid being around people who are sick. This medicine may increase your risk to bruise or  bleed. Call your doctor or health care professional if you notice any unusual bleeding. Be careful brushing and flossing your teeth or using a toothpick because you may get an infection or bleed more easily. If you have any dental work done, tell your dentist you are receiving this medicine. Avoid taking products that contain aspirin, acetaminophen, ibuprofen, naproxen, or ketoprofen unless instructed by your doctor. These medicines may hide a fever. Do not become pregnant while taking this medicine. Women should inform their doctor if they wish to become pregnant or think they might be  pregnant. There is a potential for serious side effects to an unborn child. Talk to your health care professional or pharmacist for more information. Do not breast-feed an infant while taking this medicine.  What side effects may I notice from receiving this medicine?  Side effects that you should report to your doctor or health care professional as soon as possible: -allergic reactions like skin rash, itching or hives, swelling of the face, lips, or tongue -low blood counts - this medicine may decrease the number of white blood cells, red blood cells and platelets. You may be at increased risk for infections and bleeding. -signs of infection - fever or chills, cough, sore throat, pain or difficulty passing urine -signs of decreased platelets or bleeding - bruising, pinpoint red spots on the skin, black, tarry stools, blood in the urine -signs of decreased red blood cells - unusually weak or tired, fainting spells, lightheadedness -breathing problems -muscle pains -pain at site where injected -trouble passing urine or change in the amount of urine -vomiting -yellowing of the eyes or skin Side effects that usually do not require medical attention (report to your doctor or health care professional if they continue or are bothersome): -diarrhea -hair loss -loss of appetite -nausea -skin more sensitive to sun or ultraviolet light -stomach upset This list may not describe all possible side effects. Call your doctor for medical advice about side effects. You may report side effects to FDA at 1-800-FDA-1088.

## 2012-09-30 NOTE — Progress Notes (Signed)
This office note has been dictated.

## 2012-10-01 ENCOUNTER — Ambulatory Visit (HOSPITAL_COMMUNITY): Admission: RE | Admit: 2012-10-01 | Payer: Commercial Managed Care - PPO | Source: Ambulatory Visit

## 2012-10-01 NOTE — Progress Notes (Signed)
CC:   [name]. [name], M.D. [name], M.D.  DIAGNOSIS:  Stage IIIA nodular sclerosing Hodgkin disease.  CURRENT THERAPY:  Patient to start ABVD.  INTERIM HISTORY:  Mr. Hochmuth comes in for followup.  Shockingly enough, he had a biopsy of a left inguinal lymph node.  This was done by Dr. Cicero Duck.  The pathology came back positive for a nodular sclerosing Hodgkin disease.  Again, this was quite unexpected.  The pathology report is [id number].  Since we got that report, we have been staging him.  We went ahead and did a bone marrow biopsy on him.  This was done on the 30th of October. The pathology report ([id number]) showed normocellular marrow.  We did do a PET scan on him.  The PET scan was done also on the 31st. PET scan showed diffuse involvement above and below the diaphragm.  His echocardiogram was done.  This showed an excellent ejection fraction of 50%.  He had pulmonary function tests done today which showed normal diffusion capacity.  He comes back today so we can discuss treatment options on him.  According to the Hodgkin disease prognostic scale, he has a score of 3. This still puts him at a fairly good prognostic category.  He is feeling well.  He is a little bit sore at the biopsy site.  He has no bulky adenopathy.  I talked to him and his wife at length.  I spent about an hour and a half or so with them.  I believe he would be a great candidate for ABVD.  I believe that his prognosis should still be very good.  I feel that his 5 year survival should still be over 70%-75%.  I went over the side affects of the chemotherapy with him.  I told him that he would lose his hair.  This will probably take about 2 weeks or so.  I told him about the low white cell count.  I told that we would try as best we can to keep him on schedule.  As such, we may need to use Neulasta.  Again, I feel very confident that we will be able to get him into remission and  cure him.  I answered all their questions.  I just saw him a couple of weeks ago so I did not do a formal physical exam on him.  All his vital signs were stable, however.  We will go ahead and start tomorrow.  We will see him back in 2 weeks to see how he is doing.    ______________________________ [name], M.D. PRE/MEDQ  D:  09/30/2012  T:  10/01/2012  Job:  [id number]

## 2012-10-02 ENCOUNTER — Other Ambulatory Visit: Payer: Self-pay | Admitting: *Deleted

## 2012-10-02 ENCOUNTER — Ambulatory Visit: Payer: Commercial Managed Care - PPO

## 2012-10-02 ENCOUNTER — Encounter: Payer: Self-pay | Admitting: *Deleted

## 2012-10-02 ENCOUNTER — Ambulatory Visit (HOSPITAL_BASED_OUTPATIENT_CLINIC_OR_DEPARTMENT_OTHER): Payer: Commercial Managed Care - PPO

## 2012-10-02 ENCOUNTER — Other Ambulatory Visit: Payer: Commercial Managed Care - PPO | Admitting: Lab

## 2012-10-02 VITALS — BP 114/76 | HR 66 | Temp 97.3°F | Resp 18

## 2012-10-02 DIAGNOSIS — C8195 Hodgkin lymphoma, unspecified, lymph nodes of inguinal region and lower limb: Secondary | ICD-10-CM

## 2012-10-02 DIAGNOSIS — C8118 Nodular sclerosis classical Hodgkin lymphoma, lymph nodes of multiple sites: Secondary | ICD-10-CM

## 2012-10-02 DIAGNOSIS — Z5111 Encounter for antineoplastic chemotherapy: Secondary | ICD-10-CM

## 2012-10-02 MED ORDER — PROMETHAZINE HCL 25 MG PO TABS: 12.5000 mg | ORAL_TABLET | Freq: Four times a day (QID) | ORAL | Status: DC | PRN

## 2012-10-02 MED ORDER — SODIUM CHLORIDE 0.9 % IV SOLN
Freq: Once | INTRAVENOUS | Status: AC
  Administered 2012-10-02: 10:00:00 via INTRAVENOUS

## 2012-10-02 MED ORDER — SODIUM CHLORIDE 0.9 % IV SOLN
10.0000 [IU]/m2 | Freq: Once | INTRAVENOUS | Status: AC
  Administered 2012-10-02: 20 [IU] via INTRAVENOUS
  Filled 2012-10-02: qty 6.7

## 2012-10-02 MED ORDER — ONDANSETRON 8 MG PO FILM: 8.0000 mg | ORAL_FILM | Freq: Two times a day (BID) | ORAL | Status: DC

## 2012-10-02 MED ORDER — DEXAMETHASONE SODIUM PHOSPHATE 4 MG/ML IJ SOLN
20.0000 mg | Freq: Once | INTRAMUSCULAR | Status: AC
  Administered 2012-10-02: 20 mg via INTRAVENOUS

## 2012-10-02 MED ORDER — VINBLASTINE SULFATE CHEMO INJECTION 1 MG/ML
6.1000 mg/m2 | Freq: Once | INTRAVENOUS | Status: AC
  Administered 2012-10-02: 12 mg via INTRAVENOUS
  Filled 2012-10-02: qty 12

## 2012-10-02 MED ORDER — SODIUM CHLORIDE 0.9 % IV SOLN
375.0000 mg/m2 | Freq: Once | INTRAVENOUS | Status: AC
  Administered 2012-10-02: 740 mg via INTRAVENOUS
  Filled 2012-10-02: qty 37

## 2012-10-02 MED ORDER — DOXORUBICIN HCL CHEMO IV INJECTION 2 MG/ML
25.0000 mg/m2 | Freq: Once | INTRAVENOUS | Status: AC
  Administered 2012-10-02: 50 mg via INTRAVENOUS
  Filled 2012-10-02: qty 25

## 2012-10-02 MED ORDER — LORAZEPAM 0.5 MG PO TABS: 0.5000 mg | ORAL_TABLET | Freq: Four times a day (QID) | ORAL | Status: DC | PRN

## 2012-10-02 MED ORDER — ONDANSETRON 16 MG/50ML IVPB (CHCC)
16.0000 mg | Freq: Once | INTRAVENOUS | Status: AC
  Administered 2012-10-02: 16 mg via INTRAVENOUS

## 2012-10-02 NOTE — Letter (Signed)
October 01, 2012    Trial Court Administrator  NAME:  RIORDAN, WALLE MRN:  [id number] DOB:  August 30, 1961  Re:  Payton Mccallum excuse.  Dear Milford Cage or Madam:  Mr. Julie Paolini is a patient at the [facility].  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  He is starting aggressive chemotherapy for this on November 7.  He was notified regarding jury duty to report to the [facility] courthouse on Wednesday, November 13th.  His juror number is [id number].  Please excuse Mr. Cho from jury duty for at least 1 year.  He will be undergoing aggressive chemotherapy.  The chemotherapy will increase his risk of infection.  He is not able to be around crowds while he is undergoing chemotherapy.  I realize that Mr. Davenport understands his civic duty to participate in the judicial system; however, he medically is not able to do this for 1 year.  Again, I would greatly appreciate the court's excusal of Mr. Woody Kronberg from jury duty for 1 year.  If there is any further medical information that is needed for Mr. Cudd, please feel free to let me know at [phone].  Respectfully yours,    [name], M.D.  PRE/MEDQ  D:  10/01/2012  T:  10/02/2012  Job:  [id number]

## 2012-10-02 NOTE — Patient Instructions (Signed)
Take home medications for Nausea  Zuplenz 8 mg (ondansetron) Put one film on tongue two time a day ( one in am and one in pm) beginning day after chemotherapy.  Take this for 3 days.  (Fri, Sat and Sun)  Phenergan (Promethazine) Take 1 tablet every 6 hours as needed for nausea.   Ativan (Lorazepam) Take 1 tab under tongue as needed for nausea every 6 hours.  This is the medicine that will help you sleep and help with anxiety as well as nausea.     Lorazepam tablets What is this medicine? LORAZEPAM (lor A ze pam) is a benzodiazepine. It is used to treat anxiety. This medicine may be used for other purposes; ask your health care provider or pharmacist if you have questions. What should I tell my health care provider before I take this medicine? They need to know if you have any of these conditions: -alcohol or drug abuse problem -bipolar disorder, depression, psychosis or other mental health condition -glaucoma -kidney or liver disease -lung disease or breathing difficulties -myasthenia gravis -Parkinson's disease -seizures or a history of seizures -suicidal thoughts -an unusual or allergic reaction to lorazepam, other benzodiazepines, foods, dyes, or preservatives -pregnant or trying to get pregnant -breast-feeding How should I use this medicine? Take this medicine by mouth with a glass of water. Follow the directions on the prescription label. If it upsets your stomach, take it with food or milk. Take your medicine at regular intervals. Do not take it more often than directed. Do not stop taking except on the advice of your doctor or health care professional. Talk to your pediatrician regarding the use of this medicine in children. Special care may be needed. Overdosage: If you think you have taken too much of this medicine contact a poison control center or emergency room at once. NOTE: This medicine is only for you. Do not share this medicine with others. What if I miss a  dose? If you miss a dose, take it as soon as you can. If it is almost time for your next dose, take only that dose. Do not take double or extra doses. What may interact with this medicine? -barbiturate medicines for inducing sleep or treating seizures, like phenobarbital -clozapine -medicines for depression, mental problems or psychiatric disturbances -medicines for sleep -phenytoin -probenecid -theophylline -valproic acid This list may not describe all possible interactions. Give your health care provider a list of all the medicines, herbs, non-prescription drugs, or dietary supplements you use. Also tell them if you smoke, drink alcohol, or use illegal drugs. Some items may interact with your medicine. What should I watch for while using this medicine? Visit your doctor or health care professional for regular checks on your progress. Your body may become dependent on this medicine, ask your doctor or health care professional if you still need to take it. However, if you have been taking this medicine regularly for some time, do not suddenly stop taking it. You must gradually reduce the dose or you may get severe side effects. Ask your doctor or health care professional for advice before increasing or decreasing the dose. Even after you stop taking this medicine it can still affect your body for several days. You may get drowsy or dizzy. Do not drive, use machinery, or do anything that needs mental alertness until you know how this medicine affects you. To reduce the risk of dizzy and fainting spells, do not stand or sit up quickly, especially if you are an older  patient. Alcohol may increase dizziness and drowsiness. Avoid alcoholic drinks. Do not treat yourself for coughs, colds or allergies without asking your doctor or health care professional for advice. Some ingredients can increase possible side effects. What side effects may I notice from receiving this medicine? Side effects that you  should report to your doctor or health care professional as soon as possible: -changes in vision -confusion -depression -mood changes, excitability or aggressive behavior -movement difficulty, staggering or jerky movements -muscle cramps -restlessness -weakness or tiredness Side effects that usually do not require medical attention (report to your doctor or health care professional if they continue or are bothersome): -constipation or diarrhea -difficulty sleeping, nightmares -dizziness, drowsiness -headache -nausea, vomiting This list may not describe all possible side effects. Call your doctor for medical advice about side effects. You may report side effects to FDA at 1-800-FDA-1088. Where should I keep my medicine? Keep out of the reach of children. This medicine can be abused. Keep your medicine in a safe place to protect it from theft. Do not share this medicine with anyone. Selling or giving away this medicine is dangerous and against the law. Store at room temperature between 20 and 25 degrees C (68 and 77 degrees F). Protect from light. Keep container tightly closed. Throw away any unused medicine after the expiration date. NOTE: This sheet is a summary. It may not cover all possible information. If you have questions about this medicine, talk to your doctor, pharmacist, or health care provider.  2012, [employer]. (05/14/2008 2:58:20 PM)Promethazine tablets What is this medicine? PROMETHAZINE (proe METH a zeen) is an antihistamine. It is used to treat allergic reactions and to treat or prevent nausea and vomiting from illness or motion sickness. It is also used to make you sleep before surgery, and to help treat pain or nausea after surgery. This medicine may be used for other purposes; ask your health care provider or pharmacist if you have questions. What should I tell my health care provider before I take this medicine? They need to know if you have any of these  conditions: -glaucoma -high blood pressure or heart disease -kidney disease -liver disease -lung or breathing disease, like asthma -prostate trouble -pain or difficulty passing urine -seizures -an unusual or allergic reaction to promethazine or phenothiazines, other medicines, foods, dyes, or preservatives -pregnant or trying to get pregnant -breast-feeding How should I use this medicine? Take this medicine by mouth with a glass of water. Follow the directions on the prescription label. Take your doses at regular intervals. Do not take your medicine more often than directed. Talk to your pediatrician regarding the use of this medicine in children. Special care may be needed. This medicine should not be given to infants and children younger than 24 years old. Overdosage: If you think you have taken too much of this medicine contact a poison control center or emergency room at once. NOTE: This medicine is only for you. Do not share this medicine with others. What if I miss a dose? If you miss a dose, take it as soon as you can. If it is almost time for your next dose, take only that dose. Do not take double or extra doses. What may interact with this medicine? Do not take this medicine with any of the following medications: -medicines called MAO Inhibitors like Nardil, Parnate, Marplan, Eldepryl -other phenothiazines like trimethobenzamide This medicine may also interact with the following medications: -barbiturates like phenobarbital -bromocriptine -certain antidepressants -certain antihistamines used in  allergy or cold medicines -epinephrine -levodopa -medicines for sleep -medicines for mental problems and psychotic disturbances -medicines for movement abnormalities as in Parkinson's disease, or for gastrointestinal problems -muscle relaxants -prescription pain medicines This list may not describe all possible interactions. Give your health care provider a list of all the medicines,  herbs, non-prescription drugs, or dietary supplements you use. Also tell them if you smoke, drink alcohol, or use illegal drugs. Some items may interact with your medicine. What should I watch for while using this medicine? Tell your doctor or health care professional if your symptoms do not start to get better in 1 to 2 days. You may get drowsy or dizzy. Do not drive, use machinery, or do anything that needs mental alertness until you know how this medicine affects you. To reduce the risk of dizzy or fainting spells, do not stand or sit up quickly, especially if you are an older patient. Alcohol may increase dizziness and drowsiness. Avoid alcoholic drinks. Your mouth may get dry. Chewing sugarless gum or sucking hard candy, and drinking plenty of water may help. Contact your doctor if the problem does not go away or is severe. This medicine may cause dry eyes and blurred vision. If you wear contact lenses you may feel some discomfort. Lubricating drops may help. See your eye doctor if the problem does not go away or is severe. This medicine can make you more sensitive to the sun. Keep out of the sun. If you cannot avoid being in the sun, wear protective clothing and use sunscreen. Do not use sun lamps or tanning beds/booths. If you are diabetic, check your blood-sugar levels regularly. What side effects may I notice from receiving this medicine? Side effects that you should report to your doctor or health care professional as soon as possible: -blurred vision -irregular heartbeat, palpitations or chest pain -muscle or facial twitches -pain or difficulty passing urine -seizures -skin rash -slowed or shallow breathing -unusual bleeding or bruising -yellowing of the eyes or skin Side effects that usually do not require medical attention (report to your doctor or health care professional if they continue or are bothersome): -headache -nightmares, agitation, nervousness, excitability, not able to  sleep (these are more likely in children) -stuffy nose This list may not describe all possible side effects. Call your doctor for medical advice about side effects. You may report side effects to FDA at 1-800-FDA-1088. Where should I keep my medicine? Keep out of the reach of children. Store at room temperature, between 20 and 25 degrees C (68 and 77 degrees F). Protect from light. Throw away any unused medicine after the expiration date. NOTE: This sheet is a summary. It may not cover all possible information. If you have questions about this medicine, talk to your doctor, pharmacist, or health care provider.  2012, [employer]. (06/10/2008 12:05:26 PM)Ondansetron oral soluble film What is this medicine? ONDANSETRON (on DAN se tron) is used to treat nausea and vomiting caused by chemotherapy. It is also used to prevent or treat nausea and vomiting after surgery. This medicine may be used for other purposes; ask your health care provider or pharmacist if you have questions. What should I tell my health care provider before I take this medicine? They need to know if you have any of these conditions: -heart disease -history of irregular heartbeat -liver disease -low levels of magnesium or potassium in the blood -an unusual or allergic reaction to ondansetron, granisetron, other medicines, foods, dyes, or preservatives -pregnant or trying  to get pregnant -breast-feeding How should I use this medicine? Take this medicine by mouth. Follow the directions on the prescription label. With dry hands, fold the pouch along the dotted line to expose the tear notch. While still folded, tear the pouch carefully along the edge and remove the film from the pouch. Do not cut or tear the film. Place the film on the tongue right away after removing it from the pouch. The film will dissolve in the mouth in seconds. Do not chew or swallow the film whole. After the film dissolves, swallow the medicine with  or without liquid. If you have to use multiple films to take the dose you were prescribed, take one film at a time. Allow the film to dissolve in the mouth, swallow, and then take the next film. Wash your hands after taking your medicine. Talk to your pediatrician regarding the use of this medicine in children. Special care may be needed. Overdosage: If you think you've taken too much of this medicine contact a poison control center or emergency room at once. Overdosage: If you think you have taken too much of this medicine contact a poison control center or emergency room at once. NOTE: This medicine is only for you. Do not share this medicine with others. What if I miss a dose? If you miss a dose, take it as soon as you can. If it is almost time for your next dose, take only that dose. Do not take double or extra doses. What may interact with this medicine? Do not take this medicine with any of the following medications: -apomorphine  This medicine may also interact with the following medications: -carbamazepine -phenytoin -rifampicin -tramadol This list may not describe all possible interactions. Give your health care provider a list of all the medicines, herbs, non-prescription drugs, or dietary supplements you use. Also tell them if you smoke, drink alcohol, or use illegal drugs. Some items may interact with your medicine. What should I watch for while using this medicine? Check with your doctor or health care professional as soon as you can if you have any sign of an allergic reaction. What side effects may I notice from receiving this medicine? Side effects that you should report to your doctor or health care professional as soon as possible: -allergic reactions like skin rash, itching or hives, swelling of the face, lips, or tongue -breathing problems -dizziness -fast or irregular heartbeat -feeling faint or lightheaded, falls -fever and chills -tightness in the chest  Side effects  that usually do not require medical attention (Report these to your doctor or health care professional if they continue or are bothersome.): -constipation or diarrhea -headache This list may not describe all possible side effects. Call your doctor for medical advice about side effects. You may report side effects to FDA at 1-800-FDA-1088. Where should I keep my medicine? Keep out of the reach of children. Store between 20 and 25 degrees C (68 and 77 degrees F). Keep the film in the foil pouch until ready to use. Keep foil pouches in the carton. Use the film strip right away, after you take it from the pouch. Throw away any unused medicine after the expiration date. NOTE: This sheet is a summary. It may not cover all possible information. If you have questions about this medicine, talk to your doctor, pharmacist, or health care provider.  2012, [employer]. (08/17/2010 9:38:32 AM)Dacarbazine, DTIC injection What is this medicine? DACARBAZINE (da KAR ba zeen) is a chemotherapy drug.  This medicine is used to treat skin cancer. It is also used with other medicines to treat Hodgkin's disease. This medicine may be used for other purposes; ask your health care provider or pharmacist if you have questions. What should I tell my health care provider before I take this medicine? They need to know if you have any of these conditions: -infection (especially virus infection such as chickenpox, cold sores, or herpes) -kidney disease -liver disease -low blood counts like low platelets, red blood cells, white blood cells -recent radiation therapy -an unusual or allergic reaction to dacarbazine, other chemotherapy agents, other medicines, foods, dyes, or preservatives -pregnant or trying to get pregnant -breast-feeding How should I use this medicine? This drug is given as an injection or infusion into a vein. It is administered in a hospital or clinic by a specially trained health care  professional. Talk to your pediatrician regarding the use of this medicine in children. While this drug may be prescribed for selected conditions, precautions do apply. Overdosage: If you think you have taken too much of this medicine contact a poison control center or emergency room at once. NOTE: This medicine is only for you. Do not share this medicine with others. What if I miss a dose? It is important not to miss your dose. Call your doctor or health care professional if you are unable to keep an appointment. What may interact with this medicine? -medicines to increase blood counts like filgrastim, pegfilgrastim, sargramostim -vaccines This list may not describe all possible interactions. Give your health care provider a list of all the medicines, herbs, non-prescription drugs, or dietary supplements you use. Also tell them if you smoke, drink alcohol, or use illegal drugs. Some items may interact with your medicine. What should I watch for while using this medicine? Your condition will be monitored carefully while you are receiving this medicine. You will need important blood work done while you are taking this medicine. This drug may make you feel generally unwell. This is not uncommon, as chemotherapy can affect healthy cells as well as cancer cells. Report any side effects. Continue your course of treatment even though you feel ill unless your doctor tells you to stop. Call your doctor or health care professional for advice if you get a fever, chills or sore throat, or other symptoms of a cold or flu. Do not treat yourself. This drug decreases your body's ability to fight infections. Try to avoid being around people who are sick. This medicine may increase your risk to bruise or bleed. Call your doctor or health care professional if you notice any unusual bleeding. Be careful brushing and flossing your teeth or using a toothpick because you may get an infection or bleed more easily. If you  have any dental work done, tell your dentist you are receiving this medicine. Avoid taking products that contain aspirin, acetaminophen, ibuprofen, naproxen, or ketoprofen unless instructed by your doctor. These medicines may hide a fever. Do not become pregnant while taking this medicine. Women should inform their doctor if they wish to become pregnant or think they might be pregnant. There is a potential for serious side effects to an unborn child. Talk to your health care professional or pharmacist for more information. Do not breast-feed an infant while taking this medicine. What side effects may I notice from receiving this medicine? Side effects that you should report to your doctor or health care professional as soon as possible: -allergic reactions like skin rash, itching or hives,  swelling of the face, lips, or tongue -low blood counts - this medicine may decrease the number of white blood cells, red blood cells and platelets. You may be at increased risk for infections and bleeding. -signs of infection - fever or chills, cough, sore throat, pain or difficulty passing urine -signs of decreased platelets or bleeding - bruising, pinpoint red spots on the skin, black, tarry stools, blood in the urine -signs of decreased red blood cells - unusually weak or tired, fainting spells, lightheadedness -breathing problems -muscle pains -pain at site where injected -trouble passing urine or change in the amount of urine -vomiting -yellowing of the eyes or skin Side effects that usually do not require medical attention (report to your doctor or health care professional if they continue or are bothersome): -diarrhea -hair loss -loss of appetite -nausea -skin more sensitive to sun or ultraviolet light -stomach upset This list may not describe all possible side effects. Call your doctor for medical advice about side effects. You may report side effects to FDA at 1-800-FDA-1088. Where should I keep  my medicine? This drug is given in a hospital or clinic and will not be stored at home. NOTE: This sheet is a summary. It may not cover all possible information. If you have questions about this medicine, talk to your doctor, pharmacist, or health care provider.  2012, [employer]. (03/02/2008 4:56:39 PM)Bleomycin injection What is this medicine? BLEOMYCIN (blee oh MYE sin) is a chemotherapy drug. It is used to treat many kinds of cancer like lymphoma, cervical cancer, head and neck cancer, and testicular cancer. It is also used to prevent and to treat fluid build-up around the lungs caused by some cancers. This medicine may be used for other purposes; ask your health care provider or pharmacist if you have questions. What should I tell my health care provider before I take this medicine? They need to know if you have any of these conditions: -cigarette smoker -kidney disease -lung disease -recent or ongoing radiation therapy -an unusual or allergic reaction to bleomycin, other chemotherapy agents, other medicines, foods, dyes, or preservatives -pregnant or trying to get pregnant -breast-feeding How should I use this medicine? This drug is given as an infusion into a vein or a body cavity. It can also be given as an injection into a muscle or under the skin. It is administered in a hospital or clinic by a specially trained health care professional. Talk to your pediatrician regarding the use of this medicine in children. Special care may be needed. Overdosage: If you think you have taken too much of this medicine contact a poison control center or emergency room at once. NOTE: This medicine is only for you. Do not share this medicine with others. What if I miss a dose? It is important not to miss your dose. Call your doctor or health care professional if you are unable to keep an appointment. What may interact with this medicine? -certain antibiotics given by  injection -cisplatin -cyclosporine -diuretics -foscarnet -medicines to increase blood counts like filgrastim, pegfilgrastim, sargramostim -vaccines This list may not describe all possible interactions. Give your health care provider a list of all the medicines, herbs, non-prescription drugs, or dietary supplements you use. Also tell them if you smoke, drink alcohol, or use illegal drugs. Some items may interact with your medicine. What should I watch for while using this medicine? Visit your doctor for checks on your progress. This drug may make you feel generally unwell. This is not uncommon,  as chemotherapy can affect healthy cells as well as cancer cells. Report any side effects. Continue your course of treatment even though you feel ill unless your doctor tells you to stop. Call your doctor or health care professional for advice if you get a fever, chills or sore throat, or other symptoms of a cold or flu. Do not treat yourself. This drug decreases your body's ability to fight infections. Try to avoid being around people who are sick. Avoid taking products that contain aspirin, acetaminophen, ibuprofen, naproxen, or ketoprofen unless instructed by your doctor. These medicines may hide a fever. Do not become pregnant while taking this medicine. Women should inform their doctor if they wish to become pregnant or think they might be pregnant. There is a potential for serious side effects to an unborn child. Talk to your health care professional or pharmacist for more information. Do not breast-feed an infant while taking this medicine. What side effects may I notice from receiving this medicine? Side effects that you should report to your doctor or health care professional as soon as possible: -allergic reactions like skin rash, itching or hives, swelling of the face, lips, or tongue -breathing problems -chest pain -confusion -cough -fast, irregular heartbeat -feeling faint or lightheaded,  falls -fever or chills -mouth sores -pain, tingling, numbness in the hands or feet -trouble passing urine or change in the amount of urine -yellowing of the eyes or skin Side effects that usually do not require medical attention (report to your doctor or health care professional if they continue or are bothersome): -darker skin color -hair loss -irritation at site where injected -loss of appetite -nail changes -nausea and vomiting -weight loss This list may not describe all possible side effects. Call your doctor for medical advice about side effects. You may report side effects to FDA at 1-800-FDA-1088. Where should I keep my medicine? This drug is given in a hospital or clinic and will not be stored at home. NOTE: This sheet is a summary. It may not cover all possible information. If you have questions about this medicine, talk to your doctor, pharmacist, or health care provider.  2012, [employer]. (02/11/2008 3:24:02 PM)Vinblastine injection What is this medicine? VINBLASTINE (vin BLAS teen) is a chemotherapy drug. It slows the growth of cancer cells. This medicine is used to treat many types of cancer like breast cancer, testicular cancer, Hodgkin's disease, non-Hodgkin's lymphoma, and sarcoma. This medicine may be used for other purposes; ask your health care provider or pharmacist if you have questions. What should I tell my health care provider before I take this medicine? They need to know if you have any of these conditions: -blood disorders -dental disease -gout -infection (especially a virus infection such as chickenpox, cold sores, or herpes) -liver disease -lung disease -nervous system disease -recent or ongoing radiation therapy -an unusual or allergic reaction to vinblastine, other chemotherapy agents, other medicines, foods, dyes, or preservatives -pregnant or trying to get pregnant -breast-feeding How should I use this medicine? This drug is given as  an infusion into a vein. It is administered in a hospital or clinic by a specially trained health care professional. If you have pain, swelling, burning or any unusual feeling around the site of your injection, tell your health care professional right away. Talk to your pediatrician regarding the use of this medicine in children. While this drug may be prescribed for selected conditions, precautions do apply. Overdosage: If you think you have taken too much of this medicine  contact a poison control center or emergency room at once. NOTE: This medicine is only for you. Do not share this medicine with others. What if I miss a dose? It is important not to miss your dose. Call your doctor or health care professional if you are unable to keep an appointment. What may interact with this medicine? Do not take this medicine with any of the following medications: -erythromycin -itraconazole -mibefradil -voriconazole This medicine may also interact with the following medications: -cyclosporine -fluconazole -ketoconazole -medicines for seizures like phenytoin -medicines to increase blood counts like filgrastim, pegfilgrastim, sargramostim -vaccines -verapamil Talk to your doctor or health care professional before taking any of these medicines: -acetaminophen -aspirin -ibuprofen -ketoprofen -naproxen This list may not describe all possible interactions. Give your health care provider a list of all the medicines, herbs, non-prescription drugs, or dietary supplements you use. Also tell them if you smoke, drink alcohol, or use illegal drugs. Some items may interact with your medicine. What should I watch for while using this medicine? Your condition will be monitored carefully while you are receiving this medicine. You will need important blood work done while you are taking this medicine. This drug may make you feel generally unwell. This is not uncommon, as chemotherapy can affect healthy cells as  well as cancer cells. Report any side effects. Continue your course of treatment even though you feel ill unless your doctor tells you to stop. In some cases, you may be given additional medicines to help with side effects. Follow all directions for their use. Call your doctor or health care professional for advice if you get a fever, chills or sore throat, or other symptoms of a cold or flu. Do not treat yourself. This drug decreases your body's ability to fight infections. Try to avoid being around people who are sick. This medicine may increase your risk to bruise or bleed. Call your doctor or health care professional if you notice any unusual bleeding. Be careful brushing and flossing your teeth or using a toothpick because you may get an infection or bleed more easily. If you have any dental work done, tell your dentist you are receiving this medicine. Avoid taking products that contain aspirin, acetaminophen, ibuprofen, naproxen, or ketoprofen unless instructed by your doctor. These medicines may hide a fever. Do not become pregnant while taking this medicine. Women should inform their doctor if they wish to become pregnant or think they might be pregnant. There is a potential for serious side effects to an unborn child. Talk to your health care professional or pharmacist for more information. Do not breast-feed an infant while taking this medicine. Men may have a lower sperm count while taking this medicine. Talk to your doctor if you plan to father a child. What side effects may I notice from receiving this medicine? Side effects that you should report to your doctor or health care professional as soon as possible: -allergic reactions like skin rash, itching or hives, swelling of the face, lips, or tongue -low blood counts - This drug may decrease the number of white blood cells, red blood cells and platelets. You may be at increased risk for infections and bleeding. -signs of infection - fever  or chills, cough, sore throat, pain or difficulty passing urine -signs of decreased platelets or bleeding - bruising, pinpoint red spots on the skin, black, tarry stools, nosebleeds -signs of decreased red blood cells - unusually weak or tired, fainting spells, lightheadedness -breathing problems -changes in hearing -change in  the amount of urine -chest pain -high blood pressure -mouth sores -nausea and vomiting -pain, swelling, redness or irritation at the injection site -pain, tingling, numbness in the hands or feet -problems with balance, dizziness -seizures Side effects that usually do not require medical attention (report to your doctor or health care professional if they continue or are bothersome): -constipation -hair loss -jaw pain -loss of appetite -sensitivity to light -stomach pain -tumor pain This list may not describe all possible side effects. Call your doctor for medical advice about side effects. You may report side effects to FDA at 1-800-FDA-1088. Where should I keep my medicine? This drug is given in a hospital or clinic and will not be stored at home. NOTE: This sheet is a summary. It may not cover all possible information. If you have questions about this medicine, talk to your doctor, pharmacist, or health care provider.  2012, [employer]. (08/09/2008 5:15:59 PM)Doxorubicin injection What is this medicine? DOXORUBICIN (dox oh ROO bi sin) is a chemotherapy drug. It is used to treat many kinds of cancer like Hodgkin's disease, leukemia, non-Hodgkin's lymphoma, neuroblastoma, sarcoma, and Wilms' tumor. It is also used to treat bladder cancer, breast cancer, lung cancer, ovarian cancer, stomach cancer, and thyroid cancer. This medicine may be used for other purposes; ask your health care provider or pharmacist if you have questions. What should I tell my health care provider before I take this medicine? They need to know if you have any of these  conditions: -blood disorders -heart disease, recent heart attack -infection (especially a virus infection such as chickenpox, cold sores, or herpes) -irregular heartbeat -liver disease -recent or ongoing radiation therapy -an unusual or allergic reaction to doxorubicin, other chemotherapy agents, other medicines, foods, dyes, or preservatives -pregnant or trying to get pregnant -breast-feeding How should I use this medicine? This drug is given as an infusion into a vein. It is administered in a hospital or clinic by a specially trained health care professional. If you have pain, swelling, burning or any unusual feeling around the site of your injection, tell your health care professional right away. Talk to your pediatrician regarding the use of this medicine in children. Special care may be needed. Overdosage: If you think you have taken too much of this medicine contact a poison control center or emergency room at once. NOTE: This medicine is only for you. Do not share this medicine with others. What if I miss a dose? It is important not to miss your dose. Call your doctor or health care professional if you are unable to keep an appointment. What may interact with this medicine? Do not take this medicine with any of the following medications: -cisapride -droperidol -halofantrine -pimozide -zidovudine This medicine may also interact with the following medications: -chloroquine -chlorpromazine -clarithromycin -cyclophosphamide -cyclosporine -erythromycin -medicines for depression, anxiety, or psychotic disturbances -medicines for irregular heart beat like amiodarone, bepridil, dofetilide, encainide, flecainide, propafenone, quinidine -medicines for seizures like ethotoin, fosphenytoin, phenytoin -medicines for nausea, vomiting like dolasetron, ondansetron, palonosetron -medicines to increase blood counts like filgrastim, pegfilgrastim,  sargramostim -methadone -methotrexate -pentamidine -progesterone -vaccines -verapamil Talk to your doctor or health care professional before taking any of these medicines: -acetaminophen -aspirin -ibuprofen -ketoprofen -naproxen This list may not describe all possible interactions. Give your health care provider a list of all the medicines, herbs, non-prescription drugs, or dietary supplements you use. Also tell them if you smoke, drink alcohol, or use illegal drugs. Some items may interact with your medicine. What should I  watch for while using this medicine? Your condition will be monitored carefully while you are receiving this medicine. You will need important blood work done while you are taking this medicine. This drug may make you feel generally unwell. This is not uncommon, as chemotherapy can affect healthy cells as well as cancer cells. Report any side effects. Continue your course of treatment even though you feel ill unless your doctor tells you to stop. Your urine may turn red for a few days after your dose. This is not blood. If your urine is dark or brown, call your doctor. In some cases, you may be given additional medicines to help with side effects. Follow all directions for their use. Call your doctor or health care professional for advice if you get a fever, chills or sore throat, or other symptoms of a cold or flu. Do not treat yourself. This drug decreases your body's ability to fight infections. Try to avoid being around people who are sick. This medicine may increase your risk to bruise or bleed. Call your doctor or health care professional if you notice any unusual bleeding. Be careful brushing and flossing your teeth or using a toothpick because you may get an infection or bleed more easily. If you have any dental work done, tell your dentist you are receiving this medicine. Avoid taking products that contain aspirin, acetaminophen, ibuprofen, naproxen, or ketoprofen  unless instructed by your doctor. These medicines may hide a fever. Men and women of childbearing age should use effective birth control methods while using taking this medicine. Do not become pregnant while taking this medicine. There is a potential for serious side effects to an unborn child. Talk to your health care professional or pharmacist for more information. Do not breast-feed an infant while taking this medicine. Do not let others touch your urine or other body fluids for 5 days after each treatment with this medicine. Caregivers should wear latex gloves to avoid touching body fluids during this time. What side effects may I notice from receiving this medicine? Side effects that you should report to your doctor or health care professional as soon as possible: -allergic reactions like skin rash, itching or hives, swelling of the face, lips, or tongue -low blood counts - this medicine may decrease the number of white blood cells, red blood cells and platelets. You may be at increased risk for infections and bleeding. -signs of infection - fever or chills, cough, sore throat, pain or difficulty passing urine -signs of decreased platelets or bleeding - bruising, pinpoint red spots on the skin, black, tarry stools, blood in the urine -signs of decreased red blood cells - unusually weak or tired, fainting spells, lightheadedness -breathing problems -chest pain -fast, irregular heartbeat -mouth sores -nausea, vomiting -pain, swelling, redness at site where injected -pain, tingling, numbness in the hands or feet -swelling of ankles, feet, or hands -unusual bleeding or bruising Side effects that usually do not require medical attention (report to your doctor or health care professional if they continue or are bothersome): -diarrhea -facial flushing -hair loss -loss of appetite -missed menstrual periods -nail discoloration or damage -red or watery eyes -red colored urine -stomach  upset This list may not describe all possible side effects. Call your doctor for medical advice about side effects. You may report side effects to FDA at 1-800-FDA-1088. Where should I keep my medicine? This drug is given in a hospital or clinic and will not be stored at home. NOTE: This sheet is a  summary. It may not cover all possible information. If you have questions about this medicine, talk to your doctor, pharmacist, or health care provider.  2012, [employer]. (03/02/2008 5:07:32 PM)

## 2012-10-03 ENCOUNTER — Other Ambulatory Visit: Payer: Self-pay | Admitting: Hematology & Oncology

## 2012-10-06 ENCOUNTER — Encounter: Payer: Self-pay | Admitting: Hematology & Oncology

## 2012-10-15 ENCOUNTER — Other Ambulatory Visit (HOSPITAL_BASED_OUTPATIENT_CLINIC_OR_DEPARTMENT_OTHER): Payer: Commercial Managed Care - PPO | Admitting: Lab

## 2012-10-15 ENCOUNTER — Ambulatory Visit (HOSPITAL_BASED_OUTPATIENT_CLINIC_OR_DEPARTMENT_OTHER): Payer: Commercial Managed Care - PPO

## 2012-10-15 ENCOUNTER — Ambulatory Visit (HOSPITAL_BASED_OUTPATIENT_CLINIC_OR_DEPARTMENT_OTHER): Payer: Commercial Managed Care - PPO | Admitting: Hematology & Oncology

## 2012-10-15 VITALS — BP 116/89 | HR 62 | Temp 98.2°F | Resp 18 | Ht 70.0 in | Wt 175.0 lb

## 2012-10-15 DIAGNOSIS — R591 Generalized enlarged lymph nodes: Secondary | ICD-10-CM

## 2012-10-15 DIAGNOSIS — C8195 Hodgkin lymphoma, unspecified, lymph nodes of inguinal region and lower limb: Secondary | ICD-10-CM

## 2012-10-15 DIAGNOSIS — D509 Iron deficiency anemia, unspecified: Secondary | ICD-10-CM

## 2012-10-15 DIAGNOSIS — C8119 Nodular sclerosis classical Hodgkin lymphoma, extranodal and solid organ sites: Secondary | ICD-10-CM

## 2012-10-15 DIAGNOSIS — Z5111 Encounter for antineoplastic chemotherapy: Secondary | ICD-10-CM

## 2012-10-15 LAB — CBC WITH DIFFERENTIAL (CANCER CENTER ONLY)
BASO#: 0 10*3/uL (ref 0.0–0.2)
Eosinophils Absolute: 0.1 10*3/uL (ref 0.0–0.5)
HGB: 12.7 g/dL — ABNORMAL LOW (ref 13.0–17.1)
MCH: 26 pg — ABNORMAL LOW (ref 28.0–33.4)
MONO#: 0.5 10*3/uL (ref 0.1–0.9)
MONO%: 18 % — ABNORMAL HIGH (ref 0.0–13.0)
NEUT#: 1.5 10*3/uL (ref 1.5–6.5)
Platelets: 315 10*3/uL (ref 145–400)
RBC: 4.89 10*6/uL (ref 4.20–5.70)
WBC: 3 10*3/uL — ABNORMAL LOW (ref 4.0–10.0)

## 2012-10-15 LAB — RETICULOCYTES (CHCC)
ABS Retic: 63.8 10*3/uL (ref 19.0–186.0)
RBC.: 4.91 MIL/uL (ref 4.22–5.81)

## 2012-10-15 LAB — IRON AND TIBC
%SAT: 19 % — ABNORMAL LOW (ref 20–55)
Iron: 71 ug/dL (ref 42–165)
TIBC: 370 ug/dL (ref 215–435)
UIBC: 299 ug/dL (ref 125–400)

## 2012-10-15 LAB — CHCC SATELLITE - SMEAR

## 2012-10-15 LAB — LACTATE DEHYDROGENASE: LDH: 115 U/L (ref 94–250)

## 2012-10-15 MED ORDER — ONDANSETRON 16 MG/50ML IVPB (CHCC)
16.0000 mg | Freq: Once | INTRAVENOUS | Status: AC
  Administered 2012-10-15: 16 mg via INTRAVENOUS

## 2012-10-15 MED ORDER — DOXORUBICIN HCL CHEMO IV INJECTION 2 MG/ML
25.0000 mg/m2 | Freq: Once | INTRAVENOUS | Status: AC
  Administered 2012-10-15: 50 mg via INTRAVENOUS
  Filled 2012-10-15: qty 25

## 2012-10-15 MED ORDER — DEXAMETHASONE SODIUM PHOSPHATE 4 MG/ML IJ SOLN
20.0000 mg | Freq: Once | INTRAMUSCULAR | Status: AC
  Administered 2012-10-15: 20 mg via INTRAVENOUS

## 2012-10-15 MED ORDER — VINBLASTINE SULFATE CHEMO INJECTION 1 MG/ML
6.1000 mg/m2 | Freq: Once | INTRAVENOUS | Status: AC
  Administered 2012-10-15: 12 mg via INTRAVENOUS
  Filled 2012-10-15: qty 12

## 2012-10-15 MED ORDER — SODIUM CHLORIDE 0.9 % IV SOLN
Freq: Once | INTRAVENOUS | Status: AC
  Administered 2012-10-15: 09:00:00 via INTRAVENOUS

## 2012-10-15 MED ORDER — SODIUM CHLORIDE 0.9 % IV SOLN
10.0000 [IU]/m2 | Freq: Once | INTRAVENOUS | Status: AC
  Administered 2012-10-15: 20 [IU] via INTRAVENOUS
  Filled 2012-10-15: qty 6.7

## 2012-10-15 MED ORDER — SODIUM CHLORIDE 0.9 % IV SOLN
375.0000 mg/m2 | Freq: Once | INTRAVENOUS | Status: AC
  Administered 2012-10-15: 740 mg via INTRAVENOUS
  Filled 2012-10-15: qty 37

## 2012-10-15 NOTE — Patient Instructions (Addendum)
Take home medications for Nausea  Zofran ODT 8 mg (ondansetron) Put one tab on tongue two time a day ( one in am and one in pm) beginning day after chemotherapy.  Take this for 3 days.  (Thurs, Fri and Sat)  Phenergan (Promethazine) Take 1 tablet every 6 hours as needed for nausea.   Ativan (Lorazepam) Take 1 tab under tongue as needed for nausea every 6 hours.  This is the medicine that will help you sleep and help with anxiety as well as nausea.     Lorazepam tablets What is this medicine? LORAZEPAM (lor A ze pam) is a benzodiazepine. It is used to treat anxiety. This medicine may be used for other purposes; ask your health care provider or pharmacist if you have questions. What should I tell my health care provider before I take this medicine? They need to know if you have any of these conditions: -alcohol or drug abuse problem -bipolar disorder, depression, psychosis or other mental health condition -glaucoma -kidney or liver disease -lung disease or breathing difficulties -myasthenia gravis -Parkinson's disease -seizures or a history of seizures -suicidal thoughts -an unusual or allergic reaction to lorazepam, other benzodiazepines, foods, dyes, or preservatives -pregnant or trying to get pregnant -breast-feeding How should I use this medicine? Take this medicine by mouth with a glass of water. Follow the directions on the prescription label. If it upsets your stomach, take it with food or milk. Take your medicine at regular intervals. Do not take it more often than directed. Do not stop taking except on the advice of your doctor or health care professional. Talk to your pediatrician regarding the use of this medicine in children. Special care may be needed. Overdosage: If you think you have taken too much of this medicine contact a poison control center or emergency room at once. NOTE: This medicine is only for you. Do not share this medicine with others. What if I miss a  dose? If you miss a dose, take it as soon as you can. If it is almost time for your next dose, take only that dose. Do not take double or extra doses. What may interact with this medicine? -barbiturate medicines for inducing sleep or treating seizures, like phenobarbital -clozapine -medicines for depression, mental problems or psychiatric disturbances -medicines for sleep -phenytoin -probenecid -theophylline -valproic acid This list may not describe all possible interactions. Give your health care provider a list of all the medicines, herbs, non-prescription drugs, or dietary supplements you use. Also tell them if you smoke, drink alcohol, or use illegal drugs. Some items may interact with your medicine. What should I watch for while using this medicine? Visit your doctor or health care professional for regular checks on your progress. Your body may become dependent on this medicine, ask your doctor or health care professional if you still need to take it. However, if you have been taking this medicine regularly for some time, do not suddenly stop taking it. You must gradually reduce the dose or you may get severe side effects. Ask your doctor or health care professional for advice before increasing or decreasing the dose. Even after you stop taking this medicine it can still affect your body for several days. You may get drowsy or dizzy. Do not drive, use machinery, or do anything that needs mental alertness until you know how this medicine affects you. To reduce the risk of dizzy and fainting spells, do not stand or sit up quickly, especially if you are an  older patient. Alcohol may increase dizziness and drowsiness. Avoid alcoholic drinks. Do not treat yourself for coughs, colds or allergies without asking your doctor or health care professional for advice. Some ingredients can increase possible side effects. What side effects may I notice from receiving this medicine? Side effects that you  should report to your doctor or health care professional as soon as possible: -changes in vision -confusion -depression -mood changes, excitability or aggressive behavior -movement difficulty, staggering or jerky movements -muscle cramps -restlessness -weakness or tiredness Side effects that usually do not require medical attention (report to your doctor or health care professional if they continue or are bothersome): -constipation or diarrhea -difficulty sleeping, nightmares -dizziness, drowsiness -headache -nausea, vomiting This list may not describe all possible side effects. Call your doctor for medical advice about side effects. You may report side effects to FDA at 1-800-FDA-1088. Where should I keep my medicine? Keep out of the reach of children. This medicine can be abused. Keep your medicine in a safe place to protect it from theft. Do not share this medicine with anyone. Selling or giving away this medicine is dangerous and against the law. Store at room temperature between 20 and 25 degrees C (68 and 77 degrees F). Protect from light. Keep container tightly closed. Throw away any unused medicine after the expiration date. NOTE: This sheet is a summary. It may not cover all possible information. If you have questions about this medicine, talk to your doctor, pharmacist, or health care provider.  2012, [employer]. (05/14/2008 2:58:20 PM)  Promethazine tablets What is this medicine? PROMETHAZINE (proe METH a zeen) is an antihistamine. It is used to treat allergic reactions and to treat or prevent nausea and vomiting from illness or motion sickness. It is also used to make you sleep before surgery, and to help treat pain or nausea after surgery. This medicine may be used for other purposes; ask your health care provider or pharmacist if you have questions. What should I tell my health care provider before I take this medicine? They need to know if you have any of these  conditions: -glaucoma -high blood pressure or heart disease -kidney disease -liver disease -lung or breathing disease, like asthma -prostate trouble -pain or difficulty passing urine -seizures -an unusual or allergic reaction to promethazine or phenothiazines, other medicines, foods, dyes, or preservatives -pregnant or trying to get pregnant -breast-feeding How should I use this medicine? Take this medicine by mouth with a glass of water. Follow the directions on the prescription label. Take your doses at regular intervals. Do not take your medicine more often than directed. Talk to your pediatrician regarding the use of this medicine in children. Special care may be needed. This medicine should not be given to infants and children younger than 71 years old. Overdosage: If you think you have taken too much of this medicine contact a poison control center or emergency room at once. NOTE: This medicine is only for you. Do not share this medicine with others. What if I miss a dose? If you miss a dose, take it as soon as you can. If it is almost time for your next dose, take only that dose. Do not take double or extra doses. What may interact with this medicine? Do not take this medicine with any of the following medications: -medicines called MAO Inhibitors like Nardil, Parnate, Marplan, Eldepryl -other phenothiazines like trimethobenzamide This medicine may also interact with the following medications: -barbiturates like phenobarbital -bromocriptine -certain antidepressants -certain  antihistamines used in allergy or cold medicines -epinephrine -levodopa -medicines for sleep -medicines for mental problems and psychotic disturbances -medicines for movement abnormalities as in Parkinson's disease, or for gastrointestinal problems -muscle relaxants -prescription pain medicines This list may not describe all possible interactions. Give your health care provider a list of all the medicines,  herbs, non-prescription drugs, or dietary supplements you use. Also tell them if you smoke, drink alcohol, or use illegal drugs. Some items may interact with your medicine. What should I watch for while using this medicine? Tell your doctor or health care professional if your symptoms do not start to get better in 1 to 2 days. You may get drowsy or dizzy. Do not drive, use machinery, or do anything that needs mental alertness until you know how this medicine affects you. To reduce the risk of dizzy or fainting spells, do not stand or sit up quickly, especially if you are an older patient. Alcohol may increase dizziness and drowsiness. Avoid alcoholic drinks. Your mouth may get dry. Chewing sugarless gum or sucking hard candy, and drinking plenty of water may help. Contact your doctor if the problem does not go away or is severe. This medicine may cause dry eyes and blurred vision. If you wear contact lenses you may feel some discomfort. Lubricating drops may help. See your eye doctor if the problem does not go away or is severe. This medicine can make you more sensitive to the sun. Keep out of the sun. If you cannot avoid being in the sun, wear protective clothing and use sunscreen. Do not use sun lamps or tanning beds/booths. If you are diabetic, check your blood-sugar levels regularly. What side effects may I notice from receiving this medicine? Side effects that you should report to your doctor or health care professional as soon as possible: -blurred vision -irregular heartbeat, palpitations or chest pain -muscle or facial twitches -pain or difficulty passing urine -seizures -skin rash -slowed or shallow breathing -unusual bleeding or bruising -yellowing of the eyes or skin Side effects that usually do not require medical attention (report to your doctor or health care professional if they continue or are bothersome): -headache -nightmares, agitation, nervousness, excitability, not able to  sleep (these are more likely in children) -stuffy nose This list may not describe all possible side effects. Call your doctor for medical advice about side effects. You may report side effects to FDA at 1-800-FDA-1088. Where should I keep my medicine? Keep out of the reach of children. Store at room temperature, between 20 and 25 degrees C (68 and 77 degrees F). Protect from light. Throw away any unused medicine after the expiration date. NOTE: This sheet is a summary. It may not cover all possible information. If you have questions about this medicine, talk to your doctor, pharmacist, or health care provider.  2012, [employer]. (06/10/2008 12:05:26 PM)Ondansetron oral soluble film  Ondansetron oral dissolving tablet What is this medicine? ONDANSETRON (on DAN se tron) is used to treat nausea and vomiting caused by chemotherapy. It is also used to prevent or treat nausea and vomiting after surgery. This medicine may be used for other purposes; ask your health care provider or pharmacist if you have questions. What should I tell my health care provider before I take this medicine? They need to know if you have any of these conditions: -heart disease -history of irregular heartbeat -liver disease -low levels of magnesium or potassium in the blood -an unusual or allergic reaction to ondansetron, granisetron, other  medicines, foods, dyes, or preservatives -pregnant or trying to get pregnant -breast-feeding How should I use this medicine? These tablets are made to dissolve in the mouth. Do not try to push the tablet through the foil backing. With dry hands, peel away the foil backing and gently remove the tablet. Place the tablet in the mouth and allow it to dissolve, then swallow. While you may take these tablets with water, it is not necessary to do so. Talk to your pediatrician regarding the use of this medicine in children. Special care may be needed. Overdosage: If you think you  have taken too much of this medicine contact a poison control center or emergency room at once. NOTE: This medicine is only for you. Do not share this medicine with others. What if I miss a dose? If you miss a dose, take it as soon as you can. If it is almost time for your next dose, take only that dose. Do not take double or extra doses. What may interact with this medicine? Do not take this medicine with any of the following medications: -apomorphine  This medicine may also interact with the following medications: -carbamazepine -phenytoin -rifampicin -tramadol This list may not describe all possible interactions. Give your health care provider a list of all the medicines, herbs, non-prescription drugs, or dietary supplements you use. Also tell them if you smoke, drink alcohol, or use illegal drugs. Some items may interact with your medicine. What should I watch for while using this medicine? Check with your doctor or health care professional as soon as you can if you have any sign of an allergic reaction. What side effects may I notice from receiving this medicine? Side effects that you should report to your doctor or health care professional as soon as possible: -allergic reactions like skin rash, itching or hives, swelling of the face, lips, or tongue -breathing problems -dizziness -fast or irregular heartbeat -feeling faint or lightheaded, falls -fever and chills -swelling of the hands and feet -tightness in the chest Side effects that usually do not require medical attention (report to your doctor or health care professional if they continue or are bothersome): -constipation or diarrhea -headache This list may not describe all possible side effects. Call your doctor for medical advice about side effects. You may report side effects to FDA at 1-800-FDA-1088. Where should I keep my medicine? Keep out of the reach of children. Store between 2 and 30 degrees C (36 and 86 degrees F).  Throw away any unused medicine after the expiration date. NOTE: This sheet is a summary. It may not cover all possible information. If you have questions about this medicine, talk to your doctor, pharmacist, or health care provider.  2013, [employer]. (08/18/2010 11:16:24 AM)   Dacarbazine, DTIC injection What is this medicine? DACARBAZINE (da KAR ba zeen) is a chemotherapy drug. This medicine is used to treat skin cancer. It is also used with other medicines to treat Hodgkin's disease. This medicine may be used for other purposes; ask your health care provider or pharmacist if you have questions. What should I tell my health care provider before I take this medicine? They need to know if you have any of these conditions: -infection (especially virus infection such as chickenpox, cold sores, or herpes) -kidney disease -liver disease -low blood counts like low platelets, red blood cells, white blood cells -recent radiation therapy -an unusual or allergic reaction to dacarbazine, other chemotherapy agents, other medicines, foods, dyes, or preservatives -pregnant or  trying to get pregnant -breast-feeding How should I use this medicine? This drug is given as an injection or infusion into a vein. It is administered in a hospital or clinic by a specially trained health care professional. Talk to your pediatrician regarding the use of this medicine in children. While this drug may be prescribed for selected conditions, precautions do apply. Overdosage: If you think you have taken too much of this medicine contact a poison control center or emergency room at once. NOTE: This medicine is only for you. Do not share this medicine with others. What if I miss a dose? It is important not to miss your dose. Call your doctor or health care professional if you are unable to keep an appointment. What may interact with this medicine? -medicines to increase blood counts like filgrastim,  pegfilgrastim, sargramostim -vaccines This list may not describe all possible interactions. Give your health care provider a list of all the medicines, herbs, non-prescription drugs, or dietary supplements you use. Also tell them if you smoke, drink alcohol, or use illegal drugs. Some items may interact with your medicine. What should I watch for while using this medicine? Your condition will be monitored carefully while you are receiving this medicine. You will need important blood work done while you are taking this medicine. This drug may make you feel generally unwell. This is not uncommon, as chemotherapy can affect healthy cells as well as cancer cells. Report any side effects. Continue your course of treatment even though you feel ill unless your doctor tells you to stop. Call your doctor or health care professional for advice if you get a fever, chills or sore throat, or other symptoms of a cold or flu. Do not treat yourself. This drug decreases your body's ability to fight infections. Try to avoid being around people who are sick. This medicine may increase your risk to bruise or bleed. Call your doctor or health care professional if you notice any unusual bleeding. Be careful brushing and flossing your teeth or using a toothpick because you may get an infection or bleed more easily. If you have any dental work done, tell your dentist you are receiving this medicine. Avoid taking products that contain aspirin, acetaminophen, ibuprofen, naproxen, or ketoprofen unless instructed by your doctor. These medicines may hide a fever. Do not become pregnant while taking this medicine. Women should inform their doctor if they wish to become pregnant or think they might be pregnant. There is a potential for serious side effects to an unborn child. Talk to your health care professional or pharmacist for more information. Do not breast-feed an infant while taking this medicine. What side effects may I notice  from receiving this medicine? Side effects that you should report to your doctor or health care professional as soon as possible: -allergic reactions like skin rash, itching or hives, swelling of the face, lips, or tongue -low blood counts - this medicine may decrease the number of white blood cells, red blood cells and platelets. You may be at increased risk for infections and bleeding. -signs of infection - fever or chills, cough, sore throat, pain or difficulty passing urine -signs of decreased platelets or bleeding - bruising, pinpoint red spots on the skin, black, tarry stools, blood in the urine -signs of decreased red blood cells - unusually weak or tired, fainting spells, lightheadedness -breathing problems -muscle pains -pain at site where injected -trouble passing urine or change in the amount of urine -vomiting -yellowing of the eyes  or skin Side effects that usually do not require medical attention (report to your doctor or health care professional if they continue or are bothersome): -diarrhea -hair loss -loss of appetite -nausea -skin more sensitive to sun or ultraviolet light -stomach upset This list may not describe all possible side effects. Call your doctor for medical advice about side effects. You may report side effects to FDA at 1-800-FDA-1088. Where should I keep my medicine? This drug is given in a hospital or clinic and will not be stored at home. NOTE: This sheet is a summary. It may not cover all possible information. If you have questions about this medicine, talk to your doctor, pharmacist, or health care provider.  2012, [employer] Standard. (03/02/2008 4:56:39 PM)  Bleomycin injection What is this medicine? BLEOMYCIN (blee oh MYE sin) is a chemotherapy drug. It is used to treat many kinds of cancer like lymphoma, cervical cancer, head and neck cancer, and testicular cancer. It is also used to prevent and to treat fluid build-up around the lungs caused by  some cancers. This medicine may be used for other purposes; ask your health care provider or pharmacist if you have questions. What should I tell my health care provider before I take this medicine? They need to know if you have any of these conditions: -cigarette smoker -kidney disease -lung disease -recent or ongoing radiation therapy -an unusual or allergic reaction to bleomycin, other chemotherapy agents, other medicines, foods, dyes, or preservatives -pregnant or trying to get pregnant -breast-feeding How should I use this medicine? This drug is given as an infusion into a vein or a body cavity. It can also be given as an injection into a muscle or under the skin. It is administered in a hospital or clinic by a specially trained health care professional. Talk to your pediatrician regarding the use of this medicine in children. Special care may be needed. Overdosage: If you think you have taken too much of this medicine contact a poison control center or emergency room at once. NOTE: This medicine is only for you. Do not share this medicine with others. What if I miss a dose? It is important not to miss your dose. Call your doctor or health care professional if you are unable to keep an appointment. What may interact with this medicine? -certain antibiotics given by injection -cisplatin -cyclosporine -diuretics -foscarnet -medicines to increase blood counts like filgrastim, pegfilgrastim, sargramostim -vaccines This list may not describe all possible interactions. Give your health care provider a list of all the medicines, herbs, non-prescription drugs, or dietary supplements you use. Also tell them if you smoke, drink alcohol, or use illegal drugs. Some items may interact with your medicine. What should I watch for while using this medicine? Visit your doctor for checks on your progress. This drug may make you feel generally unwell. This is not uncommon, as chemotherapy can affect  healthy cells as well as cancer cells. Report any side effects. Continue your course of treatment even though you feel ill unless your doctor tells you to stop. Call your doctor or health care professional for advice if you get a fever, chills or sore throat, or other symptoms of a cold or flu. Do not treat yourself. This drug decreases your body's ability to fight infections. Try to avoid being around people who are sick. Avoid taking products that contain aspirin, acetaminophen, ibuprofen, naproxen, or ketoprofen unless instructed by your doctor. These medicines may hide a fever. Do not become pregnant while taking  this medicine. Women should inform their doctor if they wish to become pregnant or think they might be pregnant. There is a potential for serious side effects to an unborn child. Talk to your health care professional or pharmacist for more information. Do not breast-feed an infant while taking this medicine. What side effects may I notice from receiving this medicine? Side effects that you should report to your doctor or health care professional as soon as possible: -allergic reactions like skin rash, itching or hives, swelling of the face, lips, or tongue -breathing problems -chest pain -confusion -cough -fast, irregular heartbeat -feeling faint or lightheaded, falls -fever or chills -mouth sores -pain, tingling, numbness in the hands or feet -trouble passing urine or change in the amount of urine -yellowing of the eyes or skin Side effects that usually do not require medical attention (report to your doctor or health care professional if they continue or are bothersome): -darker skin color -hair loss -irritation at site where injected -loss of appetite -nail changes -nausea and vomiting -weight loss This list may not describe all possible side effects. Call your doctor for medical advice about side effects. You may report side effects to FDA at 1-800-FDA-1088. Where should I  keep my medicine? This drug is given in a hospital or clinic and will not be stored at home. NOTE: This sheet is a summary. It may not cover all possible information. If you have questions about this medicine, talk to your doctor, pharmacist, or health care provider.  2012, [employer]. (02/11/2008 3:24:02 PM)  Vinblastine injection What is this medicine? VINBLASTINE (vin BLAS teen) is a chemotherapy drug. It slows the growth of cancer cells. This medicine is used to treat many types of cancer like breast cancer, testicular cancer, Hodgkin's disease, non-Hodgkin's lymphoma, and sarcoma. This medicine may be used for other purposes; ask your health care provider or pharmacist if you have questions. What should I tell my health care provider before I take this medicine? They need to know if you have any of these conditions: -blood disorders -dental disease -gout -infection (especially a virus infection such as chickenpox, cold sores, or herpes) -liver disease -lung disease -nervous system disease -recent or ongoing radiation therapy -an unusual or allergic reaction to vinblastine, other chemotherapy agents, other medicines, foods, dyes, or preservatives -pregnant or trying to get pregnant -breast-feeding How should I use this medicine? This drug is given as an infusion into a vein. It is administered in a hospital or clinic by a specially trained health care professional. If you have pain, swelling, burning or any unusual feeling around the site of your injection, tell your health care professional right away. Talk to your pediatrician regarding the use of this medicine in children. While this drug may be prescribed for selected conditions, precautions do apply. Overdosage: If you think you have taken too much of this medicine contact a poison control center or emergency room at once. NOTE: This medicine is only for you. Do not share this medicine with others. What if I miss a  dose? It is important not to miss your dose. Call your doctor or health care professional if you are unable to keep an appointment. What may interact with this medicine? Do not take this medicine with any of the following medications: -erythromycin -itraconazole -mibefradil -voriconazole This medicine may also interact with the following medications: -cyclosporine -fluconazole -ketoconazole -medicines for seizures like phenytoin -medicines to increase blood counts like filgrastim, pegfilgrastim, sargramostim -vaccines -verapamil Talk to your doctor  or health care professional before taking any of these medicines: -acetaminophen -aspirin -ibuprofen -ketoprofen -naproxen This list may not describe all possible interactions. Give your health care provider a list of all the medicines, herbs, non-prescription drugs, or dietary supplements you use. Also tell them if you smoke, drink alcohol, or use illegal drugs. Some items may interact with your medicine. What should I watch for while using this medicine? Your condition will be monitored carefully while you are receiving this medicine. You will need important blood work done while you are taking this medicine. This drug may make you feel generally unwell. This is not uncommon, as chemotherapy can affect healthy cells as well as cancer cells. Report any side effects. Continue your course of treatment even though you feel ill unless your doctor tells you to stop. In some cases, you may be given additional medicines to help with side effects. Follow all directions for their use. Call your doctor or health care professional for advice if you get a fever, chills or sore throat, or other symptoms of a cold or flu. Do not treat yourself. This drug decreases your body's ability to fight infections. Try to avoid being around people who are sick. This medicine may increase your risk to bruise or bleed. Call your doctor or health care professional if you  notice any unusual bleeding. Be careful brushing and flossing your teeth or using a toothpick because you may get an infection or bleed more easily. If you have any dental work done, tell your dentist you are receiving this medicine. Avoid taking products that contain aspirin, acetaminophen, ibuprofen, naproxen, or ketoprofen unless instructed by your doctor. These medicines may hide a fever. Do not become pregnant while taking this medicine. Women should inform their doctor if they wish to become pregnant or think they might be pregnant. There is a potential for serious side effects to an unborn child. Talk to your health care professional or pharmacist for more information. Do not breast-feed an infant while taking this medicine. Men may have a lower sperm count while taking this medicine. Talk to your doctor if you plan to father a child. What side effects may I notice from receiving this medicine? Side effects that you should report to your doctor or health care professional as soon as possible: -allergic reactions like skin rash, itching or hives, swelling of the face, lips, or tongue -low blood counts - This drug may decrease the number of white blood cells, red blood cells and platelets. You may be at increased risk for infections and bleeding. -signs of infection - fever or chills, cough, sore throat, pain or difficulty passing urine -signs of decreased platelets or bleeding - bruising, pinpoint red spots on the skin, black, tarry stools, nosebleeds -signs of decreased red blood cells - unusually weak or tired, fainting spells, lightheadedness -breathing problems -changes in hearing -change in the amount of urine -chest pain -high blood pressure -mouth sores -nausea and vomiting -pain, swelling, redness or irritation at the injection site -pain, tingling, numbness in the hands or feet -problems with balance, dizziness -seizures Side effects that usually do not require medical attention  (report to your doctor or health care professional if they continue or are bothersome): -constipation -hair loss -jaw pain -loss of appetite -sensitivity to light -stomach pain -tumor pain This list may not describe all possible side effects. Call your doctor for medical advice about side effects. You may report side effects to FDA at 1-800-FDA-1088. Where should I keep my  medicine? This drug is given in a hospital or clinic and will not be stored at home. NOTE: This sheet is a summary. It may not cover all possible information. If you have questions about this medicine, talk to your doctor, pharmacist, or health care provider.  2012, [employer]. (08/09/2008 5:15:59 PM)  Doxorubicin injection What is this medicine? DOXORUBICIN (dox oh ROO bi sin) is a chemotherapy drug. It is used to treat many kinds of cancer like Hodgkin's disease, leukemia, non-Hodgkin's lymphoma, neuroblastoma, sarcoma, and Wilms' tumor. It is also used to treat bladder cancer, breast cancer, lung cancer, ovarian cancer, stomach cancer, and thyroid cancer. This medicine may be used for other purposes; ask your health care provider or pharmacist if you have questions. What should I tell my health care provider before I take this medicine? They need to know if you have any of these conditions: -blood disorders -heart disease, recent heart attack -infection (especially a virus infection such as chickenpox, cold sores, or herpes) -irregular heartbeat -liver disease -recent or ongoing radiation therapy -an unusual or allergic reaction to doxorubicin, other chemotherapy agents, other medicines, foods, dyes, or preservatives -pregnant or trying to get pregnant -breast-feeding How should I use this medicine? This drug is given as an infusion into a vein. It is administered in a hospital or clinic by a specially trained health care professional. If you have pain, swelling, burning or any unusual feeling around  the site of your injection, tell your health care professional right away. Talk to your pediatrician regarding the use of this medicine in children. Special care may be needed. Overdosage: If you think you have taken too much of this medicine contact a poison control center or emergency room at once. NOTE: This medicine is only for you. Do not share this medicine with others. What if I miss a dose? It is important not to miss your dose. Call your doctor or health care professional if you are unable to keep an appointment. What may interact with this medicine? Do not take this medicine with any of the following medications: -cisapride -droperidol -halofantrine -pimozide -zidovudine This medicine may also interact with the following medications: -chloroquine -chlorpromazine -clarithromycin -cyclophosphamide -cyclosporine -erythromycin -medicines for depression, anxiety, or psychotic disturbances -medicines for irregular heart beat like amiodarone, bepridil, dofetilide, encainide, flecainide, propafenone, quinidine -medicines for seizures like ethotoin, fosphenytoin, phenytoin -medicines for nausea, vomiting like dolasetron, ondansetron, palonosetron -medicines to increase blood counts like filgrastim, pegfilgrastim, sargramostim -methadone -methotrexate -pentamidine -progesterone -vaccines -verapamil Talk to your doctor or health care professional before taking any of these medicines: -acetaminophen -aspirin -ibuprofen -ketoprofen -naproxen This list may not describe all possible interactions. Give your health care provider a list of all the medicines, herbs, non-prescription drugs, or dietary supplements you use. Also tell them if you smoke, drink alcohol, or use illegal drugs. Some items may interact with your medicine. What should I watch for while using this medicine? Your condition will be monitored carefully while you are receiving this medicine. You will need important  blood work done while you are taking this medicine. This drug may make you feel generally unwell. This is not uncommon, as chemotherapy can affect healthy cells as well as cancer cells. Report any side effects. Continue your course of treatment even though you feel ill unless your doctor tells you to stop. Your urine may turn red for a few days after your dose. This is not blood. If your urine is dark or brown, call your doctor. In some cases,  you may be given additional medicines to help with side effects. Follow all directions for their use. Call your doctor or health care professional for advice if you get a fever, chills or sore throat, or other symptoms of a cold or flu. Do not treat yourself. This drug decreases your body's ability to fight infections. Try to avoid being around people who are sick. This medicine may increase your risk to bruise or bleed. Call your doctor or health care professional if you notice any unusual bleeding. Be careful brushing and flossing your teeth or using a toothpick because you may get an infection or bleed more easily. If you have any dental work done, tell your dentist you are receiving this medicine. Avoid taking products that contain aspirin, acetaminophen, ibuprofen, naproxen, or ketoprofen unless instructed by your doctor. These medicines may hide a fever. Men and women of childbearing age should use effective birth control methods while using taking this medicine. Do not become pregnant while taking this medicine. There is a potential for serious side effects to an unborn child. Talk to your health care professional or pharmacist for more information. Do not breast-feed an infant while taking this medicine. Do not let others touch your urine or other body fluids for 5 days after each treatment with this medicine. Caregivers should wear latex gloves to avoid touching body fluids during this time. What side effects may I notice from receiving this medicine? Side  effects that you should report to your doctor or health care professional as soon as possible: -allergic reactions like skin rash, itching or hives, swelling of the face, lips, or tongue -low blood counts - this medicine may decrease the number of white blood cells, red blood cells and platelets. You may be at increased risk for infections and bleeding. -signs of infection - fever or chills, cough, sore throat, pain or difficulty passing urine -signs of decreased platelets or bleeding - bruising, pinpoint red spots on the skin, black, tarry stools, blood in the urine -signs of decreased red blood cells - unusually weak or tired, fainting spells, lightheadedness -breathing problems -chest pain -fast, irregular heartbeat -mouth sores -nausea, vomiting -pain, swelling, redness at site where injected -pain, tingling, numbness in the hands or feet -swelling of ankles, feet, or hands -unusual bleeding or bruising Side effects that usually do not require medical attention (report to your doctor or health care professional if they continue or are bothersome): -diarrhea -facial flushing -hair loss -loss of appetite -missed menstrual periods -nail discoloration or damage -red or watery eyes -red colored urine -stomach upset This list may not describe all possible side effects. Call your doctor for medical advice about side effects. You may report side effects to FDA at 1-800-FDA-1088. Where should I keep my medicine? This drug is given in a hospital or clinic and will not be stored at home. NOTE: This sheet is a summary. It may not cover all possible information. If you have questions about this medicine, talk to your doctor, pharmacist, or health care provider.  2012, [employer]. (03/02/2008 5:07:32 PM)

## 2012-10-15 NOTE — Progress Notes (Signed)
This office note has been dictated.

## 2012-10-16 NOTE — Progress Notes (Signed)
CC:   [name]. [name], M.D. [name], M.D.  DIAGNOSIS:  Stage IIIA nodular sclerosing Hodgkin disease.  CURRENT THERAPY:  Patient to complete his first cycle of ABVD today.  INTERIM HISTORY:  Mr. Griffie comes in for his followup.  He is doing well. He tolerated his first cycle of chemotherapy nicely.  He had no nausea or vomiting.  He has not lost his hair yet.  He has had a little bit of fatigue.  He is still working.  He has not noticed any swollen lymph glands.  He has had a little constipation, but this is better.  He had a little bit of a headache, but again, this is improved.  There have been no rashes.  There has been no pruritus.  PHYSICAL EXAMINATION:  General:  This is a well-developed, well- nourished white gentleman in no obvious distress.  Vital signs: Temperature 98.2, pulse 62, respiratory rate 18, blood pressure 118/89. Weight is 175.  Head and neck:  Normocephalic, atraumatic skull.  There are no ocular or oral lesions.  There are no palpable cervical or supraclavicular lymph nodes.  Lungs:  Clear bilaterally.  Cardiac: Regular rate and rhythm with a normal S1 and S2.  There are no murmurs, rubs, or bruits.  Abdomen:  Soft with good bowel sounds.  There is no fluid wave.  There is no guarding or rebound tenderness.  There is no obvious inguinal adenopathy bilaterally.  There is no palpable hepatosplenomegaly.  Axillary:  No bilateral axillary adenopathy.  Back: No tenderness over the spine, ribs, or hips.  Extremities:  No clubbing, cyanosis, or edema.  LABORATORY STUDIES:  White cell count is 3, hemoglobin 12.7, hematocrit 39.8, platelet count is 315.  IMPRESSION:  Mr. Lamay is a 51 year old gentleman with stage IIIA nodular sclerosing Hodgkin disease.  He started systemic chemotherapy with ABVD. He will get his day 15 treatment today.  We will still plan for 2 full cycles of chemo and then re-scan with a PET scan.  We will get him back in 2 more  weeks.  I do not see that we need to make any dosage adjustments.    ______________________________ [name], M.D. PRE/MEDQ  D:  10/15/2012  T:  10/16/2012  Job:  [id number]

## 2012-10-21 ENCOUNTER — Other Ambulatory Visit: Payer: Commercial Managed Care - PPO | Admitting: Lab

## 2012-10-21 ENCOUNTER — Ambulatory Visit: Payer: Commercial Managed Care - PPO | Admitting: Hematology & Oncology

## 2012-10-29 ENCOUNTER — Other Ambulatory Visit (HOSPITAL_BASED_OUTPATIENT_CLINIC_OR_DEPARTMENT_OTHER): Payer: Commercial Managed Care - PPO | Admitting: Lab

## 2012-10-29 ENCOUNTER — Ambulatory Visit (HOSPITAL_BASED_OUTPATIENT_CLINIC_OR_DEPARTMENT_OTHER): Payer: Commercial Managed Care - PPO | Admitting: Hematology & Oncology

## 2012-10-29 ENCOUNTER — Ambulatory Visit (HOSPITAL_BASED_OUTPATIENT_CLINIC_OR_DEPARTMENT_OTHER): Payer: Commercial Managed Care - PPO

## 2012-10-29 VITALS — BP 116/82 | HR 73 | Temp 98.3°F | Resp 18 | Ht 70.0 in | Wt 176.0 lb

## 2012-10-29 DIAGNOSIS — C8119 Nodular sclerosis classical Hodgkin lymphoma, extranodal and solid organ sites: Secondary | ICD-10-CM

## 2012-10-29 DIAGNOSIS — Z5111 Encounter for antineoplastic chemotherapy: Secondary | ICD-10-CM

## 2012-10-29 DIAGNOSIS — C8195 Hodgkin lymphoma, unspecified, lymph nodes of inguinal region and lower limb: Secondary | ICD-10-CM

## 2012-10-29 LAB — COMPREHENSIVE METABOLIC PANEL
AST: 27 U/L (ref 0–37)
Albumin: 4.5 g/dL (ref 3.5–5.2)
Alkaline Phosphatase: 67 U/L (ref 39–117)
Calcium: 9.5 mg/dL (ref 8.4–10.5)
Chloride: 105 mEq/L (ref 96–112)
Glucose, Bld: 85 mg/dL (ref 70–99)
Potassium: 4.6 mEq/L (ref 3.5–5.3)
Sodium: 140 mEq/L (ref 135–145)
Total Protein: 6.6 g/dL (ref 6.0–8.3)

## 2012-10-29 LAB — CBC WITH DIFFERENTIAL (CANCER CENTER ONLY)
BASO#: 0.1 10*3/uL (ref 0.0–0.2)
BASO%: 2.1 % — ABNORMAL HIGH (ref 0.0–2.0)
EOS%: 2.1 % (ref 0.0–7.0)
HGB: 12.7 g/dL — ABNORMAL LOW (ref 13.0–17.1)
MCH: 26.2 pg — ABNORMAL LOW (ref 28.0–33.4)
MCHC: 32.2 g/dL (ref 32.0–35.9)
MONO%: 21.6 % — ABNORMAL HIGH (ref 0.0–13.0)
NEUT#: 1.6 10*3/uL (ref 1.5–6.5)
NEUT%: 48.1 % (ref 40.0–80.0)
RDW: 15.8 % — ABNORMAL HIGH (ref 11.1–15.7)

## 2012-10-29 MED ORDER — SODIUM CHLORIDE 0.9 % IV SOLN
375.0000 mg/m2 | Freq: Once | INTRAVENOUS | Status: AC
  Administered 2012-10-29: 740 mg via INTRAVENOUS
  Filled 2012-10-29: qty 37

## 2012-10-29 MED ORDER — SODIUM CHLORIDE 0.9 % IV SOLN
Freq: Once | INTRAVENOUS | Status: AC
  Administered 2012-10-29: 11:00:00 via INTRAVENOUS

## 2012-10-29 MED ORDER — ONDANSETRON 16 MG/50ML IVPB (CHCC)
16.0000 mg | Freq: Once | INTRAVENOUS | Status: AC
  Administered 2012-10-29: 16 mg via INTRAVENOUS

## 2012-10-29 MED ORDER — DOXORUBICIN HCL CHEMO IV INJECTION 2 MG/ML
25.0000 mg/m2 | Freq: Once | INTRAVENOUS | Status: AC
  Administered 2012-10-29: 50 mg via INTRAVENOUS
  Filled 2012-10-29: qty 25

## 2012-10-29 MED ORDER — VINBLASTINE SULFATE CHEMO INJECTION 1 MG/ML
6.1000 mg/m2 | Freq: Once | INTRAVENOUS | Status: AC
  Administered 2012-10-29: 12 mg via INTRAVENOUS
  Filled 2012-10-29: qty 12

## 2012-10-29 MED ORDER — SODIUM CHLORIDE 0.9 % IV SOLN
10.0000 [IU]/m2 | Freq: Once | INTRAVENOUS | Status: AC
  Administered 2012-10-29: 20 [IU] via INTRAVENOUS
  Filled 2012-10-29: qty 6.7

## 2012-10-29 MED ORDER — DEXAMETHASONE SODIUM PHOSPHATE 4 MG/ML IJ SOLN
20.0000 mg | Freq: Once | INTRAMUSCULAR | Status: AC
  Administered 2012-10-29: 20 mg via INTRAVENOUS

## 2012-10-29 NOTE — Progress Notes (Signed)
This office note has been dictated.

## 2012-10-29 NOTE — Patient Instructions (Signed)
Please review the following....  Doxorubicin injection    DOXORUBICIN (dox oh ROO bi sin) is a chemotherapy drug. It is used to treat many kinds of cancer like Hodgkin's disease, leukemia, non-Hodgkin's lymphoma, neuroblastoma, sarcoma, and Wilms' tumor. It is also used to treat bladder cancer, breast cancer, lung cancer, ovarian cancer, stomach cancer, and thyroid cancer. This medicine may be used for other purposes; ask your health care provider or pharmacist if you have questions. What should I tell my health care provider before I take this medicine? They need to know if you have any of these conditions: -blood disorders -heart disease, recent heart attack -infection (especially a virus infection such as chickenpox, cold sores, or herpes) -irregular heartbeat -liver disease -recent or ongoing radiation therapy -an unusual or allergic reaction to doxorubicin, other chemotherapy agents, other medicines, foods, dyes, or preservatives -pregnant or trying to get pregnant -breast-feeding  How should I use this medicine?  This drug is given as an infusion into a vein. It is administered in a hospital or clinic by a specially trained health care professional. If you have pain, swelling, burning or any unusual feeling around the site of your injection, tell your health care professional right away. Talk to your pediatrician regarding the use of this medicine in children. Special care may be needed. Overdosage: If you think you have taken too much of this medicine contact a poison control center or emergency room at once. NOTE: This medicine is only for you. Do not share this medicine with others.  What if I miss a dose?  It is important not to miss your dose. Call your doctor or health care professional if you are unable to keep an appointment.  What may interact with this medicine?  Do not take this medicine with any of the following  medications: -cisapride -droperidol -halofantrine -pimozide -zidovudine This medicine may also interact with the following medications: -chloroquine -chlorpromazine -clarithromycin -cyclophosphamide -cyclosporine -erythromycin -medicines for depression, anxiety, or psychotic disturbances -medicines for irregular heart beat like amiodarone, bepridil, dofetilide, encainide, flecainide, propafenone, quinidine -medicines for seizures like ethotoin, fosphenytoin, phenytoin -medicines for nausea, vomiting like dolasetron, ondansetron, palonosetron -medicines to increase blood counts like filgrastim, pegfilgrastim, sargramostim -methadone -methotrexate -pentamidine -progesterone -vaccines -verapamil Talk to your doctor or health care professional before taking any of these medicines: -acetaminophen -aspirin -ibuprofen -ketoprofen -naproxen This list may not describe all possible interactions. Give your health care provider a list of all the medicines, herbs, non-prescription drugs, or dietary supplements you use. Also tell them if you smoke, drink alcohol, or use illegal drugs. Some items may interact with your medicine.  What should I watch for while using this medicine?  Your condition will be monitored carefully while you are receiving this medicine. You will need important blood work done while you are taking this medicine. This drug may make you feel generally unwell. This is not uncommon, as chemotherapy can affect healthy cells as well as cancer cells. Report any side effects. Continue your course of treatment even though you feel ill unless your doctor tells you to stop. Your urine may turn red for a few days after your dose. This is not blood. If your urine is dark or brown, call your doctor. In some cases, you may be given additional medicines to help with side effects. Follow all directions for their use. Call your doctor or health care professional for advice if you get a  fever, chills or sore throat, or other symptoms of a cold or  flu. Do not treat yourself. This drug decreases your body's ability to fight infections. Try to avoid being around people who are sick. This medicine may increase your risk to bruise or bleed. Call your doctor or health care professional if you notice any unusual bleeding. Be careful brushing and flossing your teeth or using a toothpick because you may get an infection or bleed more easily. If you have any dental work done, tell your dentist you are receiving this medicine. Avoid taking products that contain aspirin, acetaminophen, ibuprofen, naproxen, or ketoprofen unless instructed by your doctor. These medicines may hide a fever. Men and women of childbearing age should use effective birth control methods while using taking this medicine. Do not become pregnant while taking this medicine. There is a potential for serious side effects to an unborn child. Talk to your health care professional or pharmacist for more information. Do not breast-feed an infant while taking this medicine. Do not let others touch your urine or other body fluids for 5 days after each treatment with this medicine. Caregivers should wear latex gloves to avoid touching body fluids during this time.  What side effects may I notice from receiving this medicine?  Side effects that you should report to your doctor or health care professional as soon as possible: -allergic reactions like skin rash, itching or hives, swelling of the face, lips, or tongue -low blood counts - this medicine may decrease the number of white blood cells, red blood cells and platelets. You may be at increased risk for infections and bleeding. -signs of infection - fever or chills, cough, sore throat, pain or difficulty passing urine -signs of decreased platelets or bleeding - bruising, pinpoint red spots on the skin, black, tarry stools, blood in the urine -signs of decreased red blood cells -  unusually weak or tired, fainting spells, lightheadedness -breathing problems -chest pain -fast, irregular heartbeat -mouth sores -nausea, vomiting -pain, swelling, redness at site where injected -pain, tingling, numbness in the hands or feet -swelling of ankles, feet, or hands -unusual bleeding or bruising Side effects that usually do not require medical attention (report to your doctor or health care professional if they continue or are bothersome): -diarrhea -facial flushing -hair loss -loss of appetite -missed menstrual periods -nail discoloration or damage -red or watery eyes -red colored urine -stomach upset This list may not describe all possible side effects. Call your doctor for medical advice about side effects. You may report side effects to FDA at 1-800-FDA-1088.    Bleomycin injection  What is this medicine?  BLEOMYCIN (blee oh MYE sin) is a chemotherapy drug. It is used to treat many kinds of cancer like lymphoma, cervical cancer, head and neck cancer, and testicular cancer. It is also used to prevent and to treat fluid build-up around the lungs caused by some cancers. This medicine may be used for other purposes; ask your health care provider or pharmacist if you have questions.  What should I tell my health care provider before I take this medicine?  They need to know if you have any of these conditions: -cigarette smoker -kidney disease -lung disease -recent or ongoing radiation therapy -an unusual or allergic reaction to bleomycin, other chemotherapy agents, other medicines, foods, dyes, or preservatives -pregnant or trying to get pregnant -breast-feeding  How should I use this medicine?  This drug is given as an infusion into a vein or a body cavity. It can also be given as an injection into a muscle or under  the skin. It is administered in a hospital or clinic by a specially trained health care professional. Talk to your pediatrician regarding the use  of this medicine in children. Special care may be needed. Overdosage: If you think you have taken too much of this medicine contact a poison control center or emergency room at once. NOTE: This medicine is only for you. Do not share this medicine with others.  What if I miss a dose?  It is important not to miss your dose. Call your doctor or health care professional if you are unable to keep an appointment.  What may interact with this medicine?  -certain antibiotics given by injection -cisplatin -cyclosporine -diuretics -foscarnet -medicines to increase blood counts like filgrastim, pegfilgrastim, sargramostim -vaccines This list may not describe all possible interactions. Give your health care provider a list of all the medicines, herbs, non-prescription drugs, or dietary supplements you use. Also tell them if you smoke, drink alcohol, or use illegal drugs. Some items may interact with your medicine.  What should I watch for while using this medicine?  Visit your doctor for checks on your progress. This drug may make you feel generally unwell. This is not uncommon, as chemotherapy can affect healthy cells as well as cancer cells. Report any side effects. Continue your course of treatment even though you feel ill unless your doctor tells you to stop. Call your doctor or health care professional for advice if you get a fever, chills or sore throat, or other symptoms of a cold or flu. Do not treat yourself. This drug decreases your body's ability to fight infections. Try to avoid being around people who are sick. Avoid taking products that contain aspirin, acetaminophen, ibuprofen, naproxen, or ketoprofen unless instructed by your doctor. These medicines may hide a fever. Do not become pregnant while taking this medicine. Women should inform their doctor if they wish to become pregnant or think they might be pregnant. There is a potential for serious side effects to an unborn child. Talk to  your health care professional or pharmacist for more information. Do not breast-feed an infant while taking this medicine.  What side effects may I notice from receiving this medicine?  Side effects that you should report to your doctor or health care professional as soon as possible: -allergic reactions like skin rash, itching or hives, swelling of the face, lips, or tongue -breathing problems -chest pain -confusion -cough -fast, irregular heartbeat -feeling faint or lightheaded, falls -fever or chills -mouth sores -pain, tingling, numbness in the hands or feet -trouble passing urine or change in the amount of urine -yellowing of the eyes or skin Side effects that usually do not require medical attention (report to your doctor or health care professional if they continue or are bothersome): -darker skin color -hair loss -irritation at site where injected -loss of appetite -nail changes -nausea and vomiting -weight loss This list may not describe all possible side effects. Call your doctor for medical advice about side effects. You may report side effects to FDA at 1-800-FDA-1088.   Vincristine injection  What is this medicine?  VINCRISTINE (vin KRIS teen) is a chemotherapy drug. It slows the growth of cancer cells. This medicine is used to treat many types of cancer like Hodgkin's disease, leukemia, non-Hodgkin's lymphoma, neuroblastoma (brain cancer), rhabdomyosarcoma, and Wilms' tumor. This medicine may be used for other purposes; ask your health care provider or pharmacist if you have questions.  What should I tell my health care provider before  I take this medicine?  They need to know if you have any of these conditions: -blood disorders -gout -infection (especially chickenpox, cold sores, or herpes) -kidney disease -liver disease -lung disease -nervous system disease like Charcot-Marie-Tooth (CMT) -recent or ongoing radiation therapy -an unusual or allergic  reaction to vincristine, other chemotherapy agents, other medicines, foods, dyes, or preservatives -pregnant or trying to get pregnant -breast-feeding  How should I use this medicine?  This drug is given as an infusion into a vein. It is administered in a hospital or clinic by a specially trained health care professional. If you have pain, swelling, burning, or any unusual feeling around the site of your injection, tell your health care professional right away.  What if I miss a dose?  It is important not to miss your dose. Call your doctor or health care professional if you are unable to keep an appointment.  What may interact with this medicine?  Do not take this medicine with any of the following medications: -itraconazole -mibefradil -voriconazole This medicine may also interact with the following medications: -cyclosporine -erythromycin -fluconazole -ketoconazole -medicines for HIV like delavirdine, efavirenz, nevirapine -medicines for seizures like ethotoin, fosphenotoin, phenytoin -medicines to increase blood counts like filgrastim, pegfilgrastim, sargramostim -other chemotherapy drugs like cisplatin, L-asparaginase, methotrexate, mitomycin, paclitaxel -pegaspargase -vaccines -zalcitabine, ddC Talk to your doctor or health care professional before taking any of these medicines: -acetaminophen -aspirin -ibuprofen -ketoprofen -naproxen This list may not describe all possible interactions. Give your health care provider a list of all the medicines, herbs, non-prescription drugs, or dietary supplements you use. Also tell them if you smoke, drink alcohol, or use illegal drugs. Some items may interact with your medicine.  What should I watch for while using this medicine?  Your condition will be monitored carefully while you are receiving this medicine. You will need important blood work done while you are taking this medicine. This drug may make you feel generally unwell.  This is not uncommon, as chemotherapy can affect healthy cells as well as cancer cells. Report any side effects. Continue your course of treatment even though you feel ill unless your doctor tells you to stop. In some cases, you may be given additional medicines to help with side effects. Follow all directions for their use. Call your doctor or health care professional for advice if you get a fever, chills or sore throat, or other symptoms of a cold or flu. Do not treat yourself. Avoid taking products that contain aspirin, acetaminophen, ibuprofen, naproxen, or ketoprofen unless instructed by your doctor. These medicines may hide a fever. Do not become pregnant while taking this medicine. Women should inform their doctor if they wish to become pregnant or think they might be pregnant. There is a potential for serious side effects to an unborn child. Talk to your health care professional or pharmacist for more information. Do not breast-feed an infant while taking this medicine. Men may have a lower sperm count while taking this medicine. Talk to your doctor if you plan to father a child.  What side effects may I notice from receiving this medicine?  Side effects that you should report to your doctor or health care professional as soon as possible: -allergic reactions like skin rash, itching or hives, swelling of the face, lips, or tongue -breathing problems -confusion or changes in emotions or moods -constipation -cough -mouth sores -muscle weakness -nausea and vomiting -pain, swelling, redness or irritation at the injection site -pain, tingling, numbness in the hands or  feet -problems with balance, talking, walking -seizures -stomach pain -trouble passing urine or change in the amount of urine Side effects that usually do not require medical attention (report to your doctor or health care professional if they continue or are bothersome): -diarrhea -hair loss -jaw pain -loss of  appetite This list may not describe all possible side effects. Call your doctor for medical advice about side effects. You may report side effects to FDA at 1-800-FDA-1088.   Dacarbazine, DTIC injection  What is this medicine?  DACARBAZINE (da KAR ba zeen) is a chemotherapy drug. This medicine is used to treat skin cancer. It is also used with other medicines to treat Hodgkin's disease. This medicine may be used for other purposes; ask your health care provider or pharmacist if you have questions. What should I tell my health care provider before I take this medicine? They need to know if you have any of these conditions: -infection (especially virus infection such as chickenpox, cold sores, or herpes) -kidney disease -liver disease -low blood counts like low platelets, red blood cells, white blood cells -recent radiation therapy -an unusual or allergic reaction to dacarbazine, other chemotherapy agents, other medicines, foods, dyes, or preservatives -pregnant or trying to get pregnant -breast-feeding  How should I use this medicine?  This drug is given as an injection or infusion into a vein. It is administered in a hospital or clinic by a specially trained health care professional. Talk to your pediatrician regarding the use of this medicine in children. While this drug may be prescribed for selected conditions, precautions do apply. Overdosage: If you think you have taken too much of this medicine contact a poison control center or emergency room at once. NOTE: This medicine is only for you. Do not share this medicine with others.  What if I miss a dose?  It is important not to miss your dose. Call your doctor or health care professional if you are unable to keep an appointment.  What may interact with this medicine?  -medicines to increase blood counts like filgrastim, pegfilgrastim, sargramostim -vaccines This list may not describe all possible interactions. Give your health  care provider a list of all the medicines, herbs, non-prescription drugs, or dietary supplements you use. Also tell them if you smoke, drink alcohol, or use illegal drugs. Some items may interact with your medicine.  What should I watch for while using this medicine?  Your condition will be monitored carefully while you are receiving this medicine. You will need important blood work done while you are taking this medicine. This drug may make you feel generally unwell. This is not uncommon, as chemotherapy can affect healthy cells as well as cancer cells. Report any side effects. Continue your course of treatment even though you feel ill unless your doctor tells you to stop. Call your doctor or health care professional for advice if you get a fever, chills or sore throat, or other symptoms of a cold or flu. Do not treat yourself. This drug decreases your body's ability to fight infections. Try to avoid being around people who are sick. This medicine may increase your risk to bruise or bleed. Call your doctor or health care professional if you notice any unusual bleeding. Be careful brushing and flossing your teeth or using a toothpick because you may get an infection or bleed more easily. If you have any dental work done, tell your dentist you are receiving this medicine. Avoid taking products that contain aspirin, acetaminophen, ibuprofen, naproxen,  or ketoprofen unless instructed by your doctor. These medicines may hide a fever. Do not become pregnant while taking this medicine. Women should inform their doctor if they wish to become pregnant or think they might be pregnant. There is a potential for serious side effects to an unborn child. Talk to your health care professional or pharmacist for more information. Do not breast-feed an infant while taking this medicine.  What side effects may I notice from receiving this medicine?  Side effects that you should report to your doctor or health care  professional as soon as possible: -allergic reactions like skin rash, itching or hives, swelling of the face, lips, or tongue -low blood counts - this medicine may decrease the number of white blood cells, red blood cells and platelets. You may be at increased risk for infections and bleeding. -signs of infection - fever or chills, cough, sore throat, pain or difficulty passing urine -signs of decreased platelets or bleeding - bruising, pinpoint red spots on the skin, black, tarry stools, blood in the urine -signs of decreased red blood cells - unusually weak or tired, fainting spells, lightheadedness -breathing problems -muscle pains -pain at site where injected -trouble passing urine or change in the amount of urine -vomiting -yellowing of the eyes or skin Side effects that usually do not require medical attention (report to your doctor or health care professional if they continue or are bothersome): -diarrhea -hair loss -loss of appetite -nausea -skin more sensitive to sun or ultraviolet light -stomach upset This list may not describe all possible side effects. Call your doctor for medical advice about side effects. You may report side effects to FDA at 1-800-FDA-1088.  NOTE: This sheet is a summary. It may not cover all possible information. If you have questions about this medicine, talk to your doctor, pharmacist, or health care provider.  2012, [employer]. (03/02/2008 4:56:39 PM)      Please review the following....  Doxorubicin injection    DOXORUBICIN (dox oh ROO bi sin) is a chemotherapy drug. It is used to treat many kinds of cancer like Hodgkin's disease, leukemia, non-Hodgkin's lymphoma, neuroblastoma, sarcoma, and Wilms' tumor. It is also used to treat bladder cancer, breast cancer, lung cancer, ovarian cancer, stomach cancer, and thyroid cancer. This medicine may be used for other purposes; ask your health care provider or pharmacist if you have  questions. What should I tell my health care provider before I take this medicine? They need to know if you have any of these conditions: -blood disorders -heart disease, recent heart attack -infection (especially a virus infection such as chickenpox, cold sores, or herpes) -irregular heartbeat -liver disease -recent or ongoing radiation therapy -an unusual or allergic reaction to doxorubicin, other chemotherapy agents, other medicines, foods, dyes, or preservatives -pregnant or trying to get pregnant -breast-feeding  How should I use this medicine?  This drug is given as an infusion into a vein. It is administered in a hospital or clinic by a specially trained health care professional. If you have pain, swelling, burning or any unusual feeling around the site of your injection, tell your health care professional right away. Talk to your pediatrician regarding the use of this medicine in children. Special care may be needed. Overdosage: If you think you have taken too much of this medicine contact a poison control center or emergency room at once. NOTE: This medicine is only for you. Do not share this medicine with others.  What if I miss a dose?  It is important not to miss your dose. Call your doctor or health care professional if you are unable to keep an appointment.  What may interact with this medicine?  Do not take this medicine with any of the following medications: -cisapride -droperidol -halofantrine -pimozide -zidovudine This medicine may also interact with the following medications: -chloroquine -chlorpromazine -clarithromycin -cyclophosphamide -cyclosporine -erythromycin -medicines for depression, anxiety, or psychotic disturbances -medicines for irregular heart beat like amiodarone, bepridil, dofetilide, encainide, flecainide, propafenone, quinidine -medicines for seizures like ethotoin, fosphenytoin, phenytoin -medicines for nausea, vomiting like dolasetron,  ondansetron, palonosetron -medicines to increase blood counts like filgrastim, pegfilgrastim, sargramostim -methadone -methotrexate -pentamidine -progesterone -vaccines -verapamil Talk to your doctor or health care professional before taking any of these medicines: -acetaminophen -aspirin -ibuprofen -ketoprofen -naproxen This list may not describe all possible interactions. Give your health care provider a list of all the medicines, herbs, non-prescription drugs, or dietary supplements you use. Also tell them if you smoke, drink alcohol, or use illegal drugs. Some items may interact with your medicine.  What should I watch for while using this medicine?  Your condition will be monitored carefully while you are receiving this medicine. You will need important blood work done while you are taking this medicine. This drug may make you feel generally unwell. This is not uncommon, as chemotherapy can affect healthy cells as well as cancer cells. Report any side effects. Continue your course of treatment even though you feel ill unless your doctor tells you to stop. Your urine may turn red for a few days after your dose. This is not blood. If your urine is dark or brown, call your doctor. In some cases, you may be given additional medicines to help with side effects. Follow all directions for their use. Call your doctor or health care professional for advice if you get a fever, chills or sore throat, or other symptoms of a cold or flu. Do not treat yourself. This drug decreases your body's ability to fight infections. Try to avoid being around people who are sick. This medicine may increase your risk to bruise or bleed. Call your doctor or health care professional if you notice any unusual bleeding. Be careful brushing and flossing your teeth or using a toothpick because you may get an infection or bleed more easily. If you have any dental work done, tell your dentist you are receiving this  medicine. Avoid taking products that contain aspirin, acetaminophen, ibuprofen, naproxen, or ketoprofen unless instructed by your doctor. These medicines may hide a fever. Men and women of childbearing age should use effective birth control methods while using taking this medicine. Do not become pregnant while taking this medicine. There is a potential for serious side effects to an unborn child. Talk to your health care professional or pharmacist for more information. Do not breast-feed an infant while taking this medicine. Do not let others touch your urine or other body fluids for 5 days after each treatment with this medicine. Caregivers should wear latex gloves to avoid touching body fluids during this time.  What side effects may I notice from receiving this medicine?  Side effects that you should report to your doctor or health care professional as soon as possible: -allergic reactions like skin rash, itching or hives, swelling of the face, lips, or tongue -low blood counts - this medicine may decrease the number of white blood cells, red blood cells and platelets. You may be at increased risk for infections and bleeding. -signs of infection -  fever or chills, cough, sore throat, pain or difficulty passing urine -signs of decreased platelets or bleeding - bruising, pinpoint red spots on the skin, black, tarry stools, blood in the urine -signs of decreased red blood cells - unusually weak or tired, fainting spells, lightheadedness -breathing problems -chest pain -fast, irregular heartbeat -mouth sores -nausea, vomiting -pain, swelling, redness at site where injected -pain, tingling, numbness in the hands or feet -swelling of ankles, feet, or hands -unusual bleeding or bruising Side effects that usually do not require medical attention (report to your doctor or health care professional if they continue or are bothersome): -diarrhea -facial flushing -hair loss -loss of appetite -missed  menstrual periods -nail discoloration or damage -red or watery eyes -red colored urine -stomach upset This list may not describe all possible side effects. Call your doctor for medical advice about side effects. You may report side effects to FDA at 1-800-FDA-1088.    Bleomycin injection  What is this medicine?  BLEOMYCIN (blee oh MYE sin) is a chemotherapy drug. It is used to treat many kinds of cancer like lymphoma, cervical cancer, head and neck cancer, and testicular cancer. It is also used to prevent and to treat fluid build-up around the lungs caused by some cancers. This medicine may be used for other purposes; ask your health care provider or pharmacist if you have questions.  What should I tell my health care provider before I take this medicine?  They need to know if you have any of these conditions: -cigarette smoker -kidney disease -lung disease -recent or ongoing radiation therapy -an unusual or allergic reaction to bleomycin, other chemotherapy agents, other medicines, foods, dyes, or preservatives -pregnant or trying to get pregnant -breast-feeding  How should I use this medicine?  This drug is given as an infusion into a vein or a body cavity. It can also be given as an injection into a muscle or under the skin. It is administered in a hospital or clinic by a specially trained health care professional. Talk to your pediatrician regarding the use of this medicine in children. Special care may be needed. Overdosage: If you think you have taken too much of this medicine contact a poison control center or emergency room at once. NOTE: This medicine is only for you. Do not share this medicine with others.  What if I miss a dose?  It is important not to miss your dose. Call your doctor or health care professional if you are unable to keep an appointment.  What may interact with this medicine?  -certain antibiotics given by  injection -cisplatin -cyclosporine -diuretics -foscarnet -medicines to increase blood counts like filgrastim, pegfilgrastim, sargramostim -vaccines This list may not describe all possible interactions. Give your health care provider a list of all the medicines, herbs, non-prescription drugs, or dietary supplements you use. Also tell them if you smoke, drink alcohol, or use illegal drugs. Some items may interact with your medicine.  What should I watch for while using this medicine?  Visit your doctor for checks on your progress. This drug may make you feel generally unwell. This is not uncommon, as chemotherapy can affect healthy cells as well as cancer cells. Report any side effects. Continue your course of treatment even though you feel ill unless your doctor tells you to stop. Call your doctor or health care professional for advice if you get a fever, chills or sore throat, or other symptoms of a cold or flu. Do not treat yourself. This drug decreases  your body's ability to fight infections. Try to avoid being around people who are sick. Avoid taking products that contain aspirin, acetaminophen, ibuprofen, naproxen, or ketoprofen unless instructed by your doctor. These medicines may hide a fever. Do not become pregnant while taking this medicine. Women should inform their doctor if they wish to become pregnant or think they might be pregnant. There is a potential for serious side effects to an unborn child. Talk to your health care professional or pharmacist for more information. Do not breast-feed an infant while taking this medicine.  What side effects may I notice from receiving this medicine?  Side effects that you should report to your doctor or health care professional as soon as possible: -allergic reactions like skin rash, itching or hives, swelling of the face, lips, or tongue -breathing problems -chest pain -confusion -cough -fast, irregular heartbeat -feeling faint or  lightheaded, falls -fever or chills -mouth sores -pain, tingling, numbness in the hands or feet -trouble passing urine or change in the amount of urine -yellowing of the eyes or skin Side effects that usually do not require medical attention (report to your doctor or health care professional if they continue or are bothersome): -darker skin color -hair loss -irritation at site where injected -loss of appetite -nail changes -nausea and vomiting -weight loss This list may not describe all possible side effects. Call your doctor for medical advice about side effects. You may report side effects to FDA at 1-800-FDA-1088.   Vincristine injection  What is this medicine?  VINCRISTINE (vin KRIS teen) is a chemotherapy drug. It slows the growth of cancer cells. This medicine is used to treat many types of cancer like Hodgkin's disease, leukemia, non-Hodgkin's lymphoma, neuroblastoma (brain cancer), rhabdomyosarcoma, and Wilms' tumor. This medicine may be used for other purposes; ask your health care provider or pharmacist if you have questions.  What should I tell my health care provider before I take this medicine?  They need to know if you have any of these conditions: -blood disorders -gout -infection (especially chickenpox, cold sores, or herpes) -kidney disease -liver disease -lung disease -nervous system disease like Charcot-Marie-Tooth (CMT) -recent or ongoing radiation therapy -an unusual or allergic reaction to vincristine, other chemotherapy agents, other medicines, foods, dyes, or preservatives -pregnant or trying to get pregnant -breast-feeding  How should I use this medicine?  This drug is given as an infusion into a vein. It is administered in a hospital or clinic by a specially trained health care professional. If you have pain, swelling, burning, or any unusual feeling around the site of your injection, tell your health care professional right away.  What if I miss a  dose?  It is important not to miss your dose. Call your doctor or health care professional if you are unable to keep an appointment.  What may interact with this medicine?  Do not take this medicine with any of the following medications: -itraconazole -mibefradil -voriconazole This medicine may also interact with the following medications: -cyclosporine -erythromycin -fluconazole -ketoconazole -medicines for HIV like delavirdine, efavirenz, nevirapine -medicines for seizures like ethotoin, fosphenotoin, phenytoin -medicines to increase blood counts like filgrastim, pegfilgrastim, sargramostim -other chemotherapy drugs like cisplatin, L-asparaginase, methotrexate, mitomycin, paclitaxel -pegaspargase -vaccines -zalcitabine, ddC Talk to your doctor or health care professional before taking any of these medicines: -acetaminophen -aspirin -ibuprofen -ketoprofen -naproxen This list may not describe all possible interactions. Give your health care provider a list of all the medicines, herbs, non-prescription drugs, or dietary supplements you use.  Also tell them if you smoke, drink alcohol, or use illegal drugs. Some items may interact with your medicine.  What should I watch for while using this medicine?  Your condition will be monitored carefully while you are receiving this medicine. You will need important blood work done while you are taking this medicine. This drug may make you feel generally unwell. This is not uncommon, as chemotherapy can affect healthy cells as well as cancer cells. Report any side effects. Continue your course of treatment even though you feel ill unless your doctor tells you to stop. In some cases, you may be given additional medicines to help with side effects. Follow all directions for their use. Call your doctor or health care professional for advice if you get a fever, chills or sore throat, or other symptoms of a cold or flu. Do not treat yourself. Avoid  taking products that contain aspirin, acetaminophen, ibuprofen, naproxen, or ketoprofen unless instructed by your doctor. These medicines may hide a fever. Do not become pregnant while taking this medicine. Women should inform their doctor if they wish to become pregnant or think they might be pregnant. There is a potential for serious side effects to an unborn child. Talk to your health care professional or pharmacist for more information. Do not breast-feed an infant while taking this medicine. Men may have a lower sperm count while taking this medicine. Talk to your doctor if you plan to father a child.  What side effects may I notice from receiving this medicine?  Side effects that you should report to your doctor or health care professional as soon as possible: -allergic reactions like skin rash, itching or hives, swelling of the face, lips, or tongue -breathing problems -confusion or changes in emotions or moods -constipation -cough -mouth sores -muscle weakness -nausea and vomiting -pain, swelling, redness or irritation at the injection site -pain, tingling, numbness in the hands or feet -problems with balance, talking, walking -seizures -stomach pain -trouble passing urine or change in the amount of urine Side effects that usually do not require medical attention (report to your doctor or health care professional if they continue or are bothersome): -diarrhea -hair loss -jaw pain -loss of appetite This list may not describe all possible side effects. Call your doctor for medical advice about side effects. You may report side effects to FDA at 1-800-FDA-1088.   Dacarbazine, DTIC injection  What is this medicine?  DACARBAZINE (da KAR ba zeen) is a chemotherapy drug. This medicine is used to treat skin cancer. It is also used with other medicines to treat Hodgkin's disease. This medicine may be used for other purposes; ask your health care provider or pharmacist if you have  questions. What should I tell my health care provider before I take this medicine? They need to know if you have any of these conditions: -infection (especially virus infection such as chickenpox, cold sores, or herpes) -kidney disease -liver disease -low blood counts like low platelets, red blood cells, white blood cells -recent radiation therapy -an unusual or allergic reaction to dacarbazine, other chemotherapy agents, other medicines, foods, dyes, or preservatives -pregnant or trying to get pregnant -breast-feeding  How should I use this medicine?  This drug is given as an injection or infusion into a vein. It is administered in a hospital or clinic by a specially trained health care professional. Talk to your pediatrician regarding the use of this medicine in children. While this drug may be prescribed for selected conditions, precautions do  apply. Overdosage: If you think you have taken too much of this medicine contact a poison control center or emergency room at once. NOTE: This medicine is only for you. Do not share this medicine with others.  What if I miss a dose?  It is important not to miss your dose. Call your doctor or health care professional if you are unable to keep an appointment.  What may interact with this medicine?  -medicines to increase blood counts like filgrastim, pegfilgrastim, sargramostim -vaccines This list may not describe all possible interactions. Give your health care provider a list of all the medicines, herbs, non-prescription drugs, or dietary supplements you use. Also tell them if you smoke, drink alcohol, or use illegal drugs. Some items may interact with your medicine.  What should I watch for while using this medicine?  Your condition will be monitored carefully while you are receiving this medicine. You will need important blood work done while you are taking this medicine. This drug may make you feel generally unwell. This is not uncommon,  as chemotherapy can affect healthy cells as well as cancer cells. Report any side effects. Continue your course of treatment even though you feel ill unless your doctor tells you to stop. Call your doctor or health care professional for advice if you get a fever, chills or sore throat, or other symptoms of a cold or flu. Do not treat yourself. This drug decreases your body's ability to fight infections. Try to avoid being around people who are sick. This medicine may increase your risk to bruise or bleed. Call your doctor or health care professional if you notice any unusual bleeding. Be careful brushing and flossing your teeth or using a toothpick because you may get an infection or bleed more easily. If you have any dental work done, tell your dentist you are receiving this medicine. Avoid taking products that contain aspirin, acetaminophen, ibuprofen, naproxen, or ketoprofen unless instructed by your doctor. These medicines may hide a fever. Do not become pregnant while taking this medicine. Women should inform their doctor if they wish to become pregnant or think they might be pregnant. There is a potential for serious side effects to an unborn child. Talk to your health care professional or pharmacist for more information. Do not breast-feed an infant while taking this medicine.  What side effects may I notice from receiving this medicine?  Side effects that you should report to your doctor or health care professional as soon as possible: -allergic reactions like skin rash, itching or hives, swelling of the face, lips, or tongue -low blood counts - this medicine may decrease the number of white blood cells, red blood cells and platelets. You may be at increased risk for infections and bleeding. -signs of infection - fever or chills, cough, sore throat, pain or difficulty passing urine -signs of decreased platelets or bleeding - bruising, pinpoint red spots on the skin, black, tarry stools, blood in  the urine -signs of decreased red blood cells - unusually weak or tired, fainting spells, lightheadedness -breathing problems -muscle pains -pain at site where injected -trouble passing urine or change in the amount of urine -vomiting -yellowing of the eyes or skin Side effects that usually do not require medical attention (report to your doctor or health care professional if they continue or are bothersome): -diarrhea -hair loss -loss of appetite -nausea -skin more sensitive to sun or ultraviolet light -stomach upset This list may not describe all possible side effects. Call your  doctor for medical advice about side effects. You may report side effects to FDA at 1-800-FDA-1088.  NOTE: This sheet is a summary. It may not cover all possible information. If you have questions about this medicine, talk to your doctor, pharmacist, or health care provider.  2012, [employer]. (03/02/2008 4:56:39 PM)

## 2012-10-30 NOTE — Progress Notes (Signed)
CC:   Brian [name]. Brian [name], M.D. Brian [name], M.D.  DIAGNOSIS:  Stage IIIA nodular sclerosing Hodgkin disease.  CURRENT THERAPY:  Patient is status post cycle 1 of ABVD.  INTERIM HISTORY:  Brian [name] comes in for his followup.  He tolerated his first cycle of chemo quite nicely.  He is still working.  He has not lost his hair.  He had a good Thanksgiving.  He has been keeping up with his exercising.  He may not be running as much, but he is still getting out and doing things.  He does have a little constipation.  He does get a little bit of a headache.  I suspect that these might be from his antiemetics.  He has had no pruritus.  He has not noted any rashes.  There is a little bit of itchiness around the neck and upper back.  He has had no mouth sores.  He has had no visual issues.  PHYSICAL EXAMINATION:  General:  This is a well-developed, well- nourished white gentleman in no obvious distress.  Vital signs:  98.3, pulse 73, respiratory rate 18, blood pressure 116/82.  Weight is 176. [name] and neck:  Normocephalic, atraumatic skull.  There are no ocular or oral lesions.  There are no palpable cervical or supraclavicular lymph nodes.  Lungs:  Clear bilaterally.  Cardiac:  Regular rate and rhythm with a normal S1 and S2.  There are no murmurs, rubs, or bruits. Abdomen:  Soft with good bowel sounds.  There is no fluid wave.  No palpable hepatosplenomegaly is noted.  He has no inguinal adenopathy bilaterally.  Axillary:  No bilateral axillary adenopathy.  Extremities: No clubbing, cyanosis, or edema.  He has good range of motion of his joints.  Skin:  Shows a little bit of a macular type rash on the upper back.  Neurologic:  No focal neurological deficits.  LABORATORY STUDIES:  White cell count is 3.3, hemoglobin 12.7, hematocrit 39.5, platelet count 290.  IMPRESSION:  Brian [name] is a 51 year old gentleman with stage IIIA nodular sclerosing Hodgkin disease.  He is on systemic  chemotherapy.  We will go ahead with cycle #2 today.  After completion of this 2nd cycle, we will set him up with a PET scan.  I would suspect that his scan should be normalized after 2 cycles.  We will see him back in 2 more weeks.    ______________________________ [name], M.D. PRE/MEDQ  D:  10/29/2012  T:  10/30/2012  Job:  [id number]

## 2012-11-12 ENCOUNTER — Ambulatory Visit (HOSPITAL_BASED_OUTPATIENT_CLINIC_OR_DEPARTMENT_OTHER): Payer: Commercial Managed Care - PPO | Admitting: Hematology & Oncology

## 2012-11-12 ENCOUNTER — Ambulatory Visit (HOSPITAL_BASED_OUTPATIENT_CLINIC_OR_DEPARTMENT_OTHER): Payer: Commercial Managed Care - PPO

## 2012-11-12 ENCOUNTER — Other Ambulatory Visit (HOSPITAL_BASED_OUTPATIENT_CLINIC_OR_DEPARTMENT_OTHER): Payer: Commercial Managed Care - PPO | Admitting: Lab

## 2012-11-12 VITALS — BP 128/84 | HR 72 | Temp 97.3°F | Resp 18

## 2012-11-12 DIAGNOSIS — Z5111 Encounter for antineoplastic chemotherapy: Secondary | ICD-10-CM

## 2012-11-12 DIAGNOSIS — C8195 Hodgkin lymphoma, unspecified, lymph nodes of inguinal region and lower limb: Secondary | ICD-10-CM

## 2012-11-12 DIAGNOSIS — C8119 Nodular sclerosis classical Hodgkin lymphoma, extranodal and solid organ sites: Secondary | ICD-10-CM

## 2012-11-12 LAB — CBC WITH DIFFERENTIAL (CANCER CENTER ONLY)
EOS%: 4.1 % (ref 0.0–7.0)
MCH: 26.9 pg — ABNORMAL LOW (ref 28.0–33.4)
MCHC: 32.9 g/dL (ref 32.0–35.9)
MONO%: 20.6 % — ABNORMAL HIGH (ref 0.0–13.0)
NEUT#: 1.5 10*3/uL (ref 1.5–6.5)
Platelets: 255 10*3/uL (ref 145–400)
RDW: 16.3 % — ABNORMAL HIGH (ref 11.1–15.7)

## 2012-11-12 MED ORDER — DEXAMETHASONE SODIUM PHOSPHATE 4 MG/ML IJ SOLN
20.0000 mg | Freq: Once | INTRAMUSCULAR | Status: AC
  Administered 2012-11-12: 20 mg via INTRAVENOUS

## 2012-11-12 MED ORDER — ONDANSETRON 16 MG/50ML IVPB (CHCC)
16.0000 mg | Freq: Once | INTRAVENOUS | Status: AC
  Administered 2012-11-12: 16 mg via INTRAVENOUS

## 2012-11-12 MED ORDER — SODIUM CHLORIDE 0.9 % IV SOLN
10.0000 [IU]/m2 | Freq: Once | INTRAVENOUS | Status: AC
  Administered 2012-11-12: 20 [IU] via INTRAVENOUS
  Filled 2012-11-12: qty 6.7

## 2012-11-12 MED ORDER — SODIUM CHLORIDE 0.9 % IV SOLN
Freq: Once | INTRAVENOUS | Status: AC
  Administered 2012-11-12: 10:00:00 via INTRAVENOUS

## 2012-11-12 MED ORDER — DOXORUBICIN HCL CHEMO IV INJECTION 2 MG/ML
25.0000 mg/m2 | Freq: Once | INTRAVENOUS | Status: AC
  Administered 2012-11-12: 50 mg via INTRAVENOUS
  Filled 2012-11-12: qty 25

## 2012-11-12 MED ORDER — VINBLASTINE SULFATE CHEMO INJECTION 1 MG/ML
6.1000 mg/m2 | Freq: Once | INTRAVENOUS | Status: AC
  Administered 2012-11-12: 12 mg via INTRAVENOUS
  Filled 2012-11-12: qty 12

## 2012-11-12 MED ORDER — SODIUM CHLORIDE 0.9 % IV SOLN
375.0000 mg/m2 | Freq: Once | INTRAVENOUS | Status: AC
  Administered 2012-11-12: 740 mg via INTRAVENOUS
  Filled 2012-11-12: qty 37

## 2012-11-12 NOTE — Patient Instructions (Addendum)
[facility] Discharge Instructions for Patients Receiving Chemotherapy  Today you received the following chemotherapy agents ABVD (Adriamycin, Bleomycin, Velban, and DTIC)  To help prevent nausea and vomiting after your treatment, we encourage you to take your nausea medication   1) Zofran (Ondansetron) 8 mg twice a day for 3 days beginning the day after chemotherapy.  Take one in the morning and one in the evening to help prevent nausea  2) Decadron (Dexamethasone) Take 8 mg one time daily for 3 days beginning morning after chemotherapy  3) Compazine (Prochlorperazine) Take 10 mg every 6 hours as needed for nausea.   If you develop nausea and vomiting that is not controlled by your nausea medication, call the clinic. If it is after clinic hours your family physician or the after hours number for the clinic or go to the Emergency Department.   BELOW ARE SYMPTOMS THAT SHOULD BE REPORTED IMMEDIATELY:  *FEVER GREATER THAN 100.5 F  *CHILLS WITH OR WITHOUT FEVER  NAUSEA AND VOMITING THAT IS NOT CONTROLLED WITH YOUR NAUSEA MEDICATION  *UNUSUAL SHORTNESS OF BREATH  *UNUSUAL BRUISING OR BLEEDING  TENDERNESS IN MOUTH AND THROAT WITH OR WITHOUT PRESENCE OF ULCERS  *URINARY PROBLEMS  *BOWEL PROBLEMS  UNUSUAL RASH Items with * indicate a potential emergency and should be followed up as soon as possible.  One of the nurses will contact you 24 hours after your treatment. Please let the nurse know about any problems that you may have experienced. Feel free to call the clinic [phone]   I have been informed and understand all the instructions given to me. I know to contact the clinic, my physician, or go to the Emergency Department if any problems should occur. I do not have any questions at this time, but understand that I may call the clinic during office hours   should I have any questions or need assistance in obtaining follow up  care.    __________________________________________  _____________  __________ Signature of Patient or Authorized Representative            Date                   Time    __________________________________________ Nurse's Signature

## 2012-11-12 NOTE — Progress Notes (Signed)
This office note has been dictated.

## 2012-11-13 NOTE — Progress Notes (Signed)
CC:   Brian [name]. Brian [name], M.D. Brian [name], M.D.  DIAGNOSIS:  Stage IIIA nodular sclerosing Hodgkin disease.  CURRENT THERAPY:  Patient to complete his second cycle of ABVD today.  INTERIM HISTORY:  Brian [name] comes in for his followup.  He continues to look great.  He is still working.  He is still exercising.  His appetite is good.  He has had no nausea or vomiting.  He has had no problems going to the bathroom.  He has an occasional headache.  There has been no rash.  He has had no cough or shortness of breath.  He has had no dysphagia or odynophagia.  There have been no mouth sores.  Overall, his performance status is ECOG 0.  He still has not lost any of his hair.  PHYSICAL EXAMINATION:  General:  This is a well-developed, well- nourished white gentleman in no obvious distress.  Vital signs: Temperature of 97.3, pulse 72, respiratory rate 18, blood pressure 128/84.  Weight is 177.  [name] and neck:  Normocephalic, atraumatic skull.  There are no ocular or oral lesions.  There are no palpable cervical or supraclavicular lymph nodes.  Lungs:  Clear bilaterally. Cardiac:  Regular rate and rhythm with a normal S1 and S2.  There are no murmurs, rubs, or bruits.  Abdomen:  Soft with good bowel sounds.  There is no palpable abdominal mass.  There is no palpable hepatosplenomegaly. Back:  No tenderness over the spine, ribs, or hips.  Extremities:  No clubbing, cyanosis, or edema.  Axillary:  No bilateral axillary adenopathy.  LABORATORY STUDIES:  White cell count is 3.2, hemoglobin 12.9, hematocrit 39.2, platelet count 255.  IMPRESSION:  Brian [name] is a 51 year old gentleman with stage IIIA nodular sclerosing Hodgkin disease.  He has really done well.  We will go ahead and plan for his first PET scan now.  He will have this done after Christmas.  I suspect and expect that his PET scan should be normalized now.  We will go ahead and plan to get Brian [name] back the 2nd of January.   We will still plan for 6-8 cycles of chemotherapy.    ______________________________ [name], M.D. PRE/MEDQ  D:  11/12/2012  T:  11/13/2012  Job:  [id number]

## 2012-11-21 ENCOUNTER — Encounter (HOSPITAL_COMMUNITY)
Admission: RE | Admit: 2012-11-21 | Discharge: 2012-11-21 | Disposition: A | Discharge status: Home / Self Care | Payer: Commercial Managed Care - PPO | Source: Ambulatory Visit | Attending: Hematology & Oncology | Admitting: Hematology & Oncology

## 2012-11-21 DIAGNOSIS — C8195 Hodgkin lymphoma, unspecified, lymph nodes of inguinal region and lower limb: Secondary | ICD-10-CM | POA: Insufficient documentation

## 2012-11-21 MED ORDER — FLUDEOXYGLUCOSE F - 18 (FDG) INJECTION
18.3000 | Freq: Once | INTRAVENOUS | Status: AC | PRN
  Administered 2012-11-21: 18.3 via INTRAVENOUS

## 2012-11-27 ENCOUNTER — Ambulatory Visit (HOSPITAL_BASED_OUTPATIENT_CLINIC_OR_DEPARTMENT_OTHER): Payer: Commercial Managed Care - PPO | Admitting: Hematology & Oncology

## 2012-11-27 ENCOUNTER — Ambulatory Visit (HOSPITAL_BASED_OUTPATIENT_CLINIC_OR_DEPARTMENT_OTHER): Payer: Commercial Managed Care - PPO

## 2012-11-27 ENCOUNTER — Other Ambulatory Visit (HOSPITAL_BASED_OUTPATIENT_CLINIC_OR_DEPARTMENT_OTHER): Payer: Commercial Managed Care - PPO | Admitting: Lab

## 2012-11-27 ENCOUNTER — Other Ambulatory Visit: Payer: Self-pay | Admitting: Hematology & Oncology

## 2012-11-27 VITALS — BP 111/66 | HR 70 | Temp 97.5°F | Resp 18 | Ht 70.0 in | Wt 176.0 lb

## 2012-11-27 DIAGNOSIS — Z5111 Encounter for antineoplastic chemotherapy: Secondary | ICD-10-CM

## 2012-11-27 DIAGNOSIS — C8190 Hodgkin lymphoma, unspecified, unspecified site: Secondary | ICD-10-CM

## 2012-11-27 DIAGNOSIS — C8195 Hodgkin lymphoma, unspecified, lymph nodes of inguinal region and lower limb: Secondary | ICD-10-CM

## 2012-11-27 DIAGNOSIS — C8119 Nodular sclerosis classical Hodgkin lymphoma, extranodal and solid organ sites: Secondary | ICD-10-CM

## 2012-11-27 LAB — COMPREHENSIVE METABOLIC PANEL
ALT: 25 U/L (ref 0–53)
AST: 21 U/L (ref 0–37)
Alkaline Phosphatase: 51 U/L (ref 39–117)
BUN: 17 mg/dL (ref 6–23)
Calcium: 9 mg/dL (ref 8.4–10.5)
Chloride: 107 mEq/L (ref 96–112)
Creatinine, Ser: 0.92 mg/dL (ref 0.50–1.35)
Total Bilirubin: 0.7 mg/dL (ref 0.3–1.2)

## 2012-11-27 LAB — CBC WITH DIFFERENTIAL (CANCER CENTER ONLY)
BASO#: 0.1 10*3/uL (ref 0.0–0.2)
BASO%: 2.2 % — ABNORMAL HIGH (ref 0.0–2.0)
EOS%: 5.6 % (ref 0.0–7.0)
HCT: 38.7 % (ref 38.7–49.9)
HGB: 12.9 g/dL — ABNORMAL LOW (ref 13.0–17.1)
LYMPH%: 31.5 % (ref 14.0–48.0)
MCH: 27.6 pg — ABNORMAL LOW (ref 28.0–33.4)
MCHC: 33.3 g/dL (ref 32.0–35.9)
MCV: 83 fL (ref 82–98)
MONO%: 23.3 % — ABNORMAL HIGH (ref 0.0–13.0)
NEUT%: 37.4 % — ABNORMAL LOW (ref 40.0–80.0)
RDW: 17.3 % — ABNORMAL HIGH (ref 11.1–15.7)

## 2012-11-27 MED ORDER — DEXAMETHASONE SODIUM PHOSPHATE 10 MG/ML IJ SOLN
20.0000 mg | Freq: Once | INTRAMUSCULAR | Status: AC
  Administered 2012-11-27: 20 mg via INTRAVENOUS

## 2012-11-27 MED ORDER — DOXORUBICIN HCL CHEMO IV INJECTION 2 MG/ML
25.0000 mg/m2 | Freq: Once | INTRAVENOUS | Status: AC
  Administered 2012-11-27: 50 mg via INTRAVENOUS
  Filled 2012-11-27: qty 25

## 2012-11-27 MED ORDER — SODIUM CHLORIDE 0.9 % IV SOLN
Freq: Once | INTRAVENOUS | Status: AC
  Administered 2012-11-27: 11:00:00 via INTRAVENOUS

## 2012-11-27 MED ORDER — VINBLASTINE SULFATE CHEMO INJECTION 1 MG/ML
12.0000 mg | Freq: Once | INTRAVENOUS | Status: AC
  Administered 2012-11-27: 12 mg via INTRAVENOUS
  Filled 2012-11-27: qty 12

## 2012-11-27 MED ORDER — ONDANSETRON 16 MG/50ML IVPB (CHCC)
16.0000 mg | Freq: Once | INTRAVENOUS | Status: AC
  Administered 2012-11-27: 16 mg via INTRAVENOUS

## 2012-11-27 MED ORDER — SODIUM CHLORIDE 0.9 % IV SOLN
10.0000 [IU]/m2 | Freq: Once | INTRAVENOUS | Status: AC
  Administered 2012-11-27: 20 [IU] via INTRAVENOUS
  Filled 2012-11-27: qty 6.7

## 2012-11-27 MED ORDER — SODIUM CHLORIDE 0.9 % IV SOLN
375.0000 mg/m2 | Freq: Once | INTRAVENOUS | Status: AC
  Administered 2012-11-27: 740 mg via INTRAVENOUS
  Filled 2012-11-27: qty 37

## 2012-11-27 NOTE — Patient Instructions (Addendum)
Dacarbazine, DTIC injection What is this medicine? DACARBAZINE (da KAR ba zeen) is a chemotherapy drug. This medicine is used to treat skin cancer. It is also used with other medicines to treat Hodgkin's disease. This medicine may be used for other purposes; ask your health care provider or pharmacist if you have questions. What should I tell my health care provider before I take this medicine? They need to know if you have any of these conditions: -infection (especially virus infection such as chickenpox, cold sores, or herpes) -kidney disease -liver disease -low blood counts like low platelets, red blood cells, white blood cells -recent radiation therapy -an unusual or allergic reaction to dacarbazine, other chemotherapy agents, other medicines, foods, dyes, or preservatives -pregnant or trying to get pregnant -breast-feeding How should I use this medicine? This drug is given as an injection or infusion into a vein. It is administered in a hospital or clinic by a specially trained health care professional. Talk to your pediatrician regarding the use of this medicine in children. While this drug may be prescribed for selected conditions, precautions do apply. Overdosage: If you think you have taken too much of this medicine contact a poison control center or emergency room at once. NOTE: This medicine is only for you. Do not share this medicine with others. What if I miss a dose? It is important not to miss your dose. Call your doctor or health care professional if you are unable to keep an appointment. What may interact with this medicine? -medicines to increase blood counts like filgrastim, pegfilgrastim, sargramostim -vaccines This list may not describe all possible interactions. Give your health care provider a list of all the medicines, herbs, non-prescription drugs, or dietary supplements you use. Also tell them if you smoke, drink alcohol, or use illegal drugs. Some items may interact  with your medicine. What should I watch for while using this medicine? Your condition will be monitored carefully while you are receiving this medicine. You will need important blood work done while you are taking this medicine. This drug may make you feel generally unwell. This is not uncommon, as chemotherapy can affect healthy cells as well as cancer cells. Report any side effects. Continue your course of treatment even though you feel ill unless your doctor tells you to stop. Call your doctor or health care professional for advice if you get a fever, chills or sore throat, or other symptoms of a cold or flu. Do not treat yourself. This drug decreases your body's ability to fight infections. Try to avoid being around people who are sick. This medicine may increase your risk to bruise or bleed. Call your doctor or health care professional if you notice any unusual bleeding. Be careful brushing and flossing your teeth or using a toothpick because you may get an infection or bleed more easily. If you have any dental work done, tell your dentist you are receiving this medicine. Avoid taking products that contain aspirin, acetaminophen, ibuprofen, naproxen, or ketoprofen unless instructed by your doctor. These medicines may hide a fever. Do not become pregnant while taking this medicine. Women should inform their doctor if they wish to become pregnant or think they might be pregnant. There is a potential for serious side effects to an unborn child. Talk to your health care professional or pharmacist for more information. Do not breast-feed an infant while taking this medicine. What side effects may I notice from receiving this medicine? Side effects that you should report to your doctor or   health care professional as soon as possible: -allergic reactions like skin rash, itching or hives, swelling of the face, lips, or tongue -low blood counts - this medicine may decrease the number of white blood cells,  red blood cells and platelets. You may be at increased risk for infections and bleeding. -signs of infection - fever or chills, cough, sore throat, pain or difficulty passing urine -signs of decreased platelets or bleeding - bruising, pinpoint red spots on the skin, black, tarry stools, blood in the urine -signs of decreased red blood cells - unusually weak or tired, fainting spells, lightheadedness -breathing problems -muscle pains -pain at site where injected -trouble passing urine or change in the amount of urine -vomiting -yellowing of the eyes or skin Side effects that usually do not require medical attention (report to your doctor or health care professional if they continue or are bothersome): -diarrhea -hair loss -loss of appetite -nausea -skin more sensitive to sun or ultraviolet light -stomach upset This list may not describe all possible side effects. Call your doctor for medical advice about side effects. You may report side effects to FDA at 1-800-FDA-1088. Where should I keep my medicine? This drug is given in a hospital or clinic and will not be stored at home. NOTE: This sheet is a summary. It may not cover all possible information. If you have questions about this medicine, talk to your doctor, pharmacist, or health care provider.  2012, [employer]. (03/02/2008 4:56:39 PM)  Bleomycin injection What is this medicine? BLEOMYCIN (blee oh MYE sin) is a chemotherapy drug. It is used to treat many kinds of cancer like lymphoma, cervical cancer, head and neck cancer, and testicular cancer. It is also used to prevent and to treat fluid build-up around the lungs caused by some cancers. This medicine may be used for other purposes; ask your health care provider or pharmacist if you have questions. What should I tell my health care provider before I take this medicine? They need to know if you have any of these conditions: -cigarette smoker -kidney disease -lung  disease -recent or ongoing radiation therapy -an unusual or allergic reaction to bleomycin, other chemotherapy agents, other medicines, foods, dyes, or preservatives -pregnant or trying to get pregnant -breast-feeding How should I use this medicine? This drug is given as an infusion into a vein or a body cavity. It can also be given as an injection into a muscle or under the skin. It is administered in a hospital or clinic by a specially trained health care professional. Talk to your pediatrician regarding the use of this medicine in children. Special care may be needed. Overdosage: If you think you have taken too much of this medicine contact a poison control center or emergency room at once. NOTE: This medicine is only for you. Do not share this medicine with others. What if I miss a dose? It is important not to miss your dose. Call your doctor or health care professional if you are unable to keep an appointment. What may interact with this medicine? -certain antibiotics given by injection -cisplatin -cyclosporine -diuretics -foscarnet -medicines to increase blood counts like filgrastim, pegfilgrastim, sargramostim -vaccines This list may not describe all possible interactions. Give your health care provider a list of all the medicines, herbs, non-prescription drugs, or dietary supplements you use. Also tell them if you smoke, drink alcohol, or use illegal drugs. Some items may interact with your medicine. What should I watch for while using this medicine? Visit your doctor   for checks on your progress. This drug may make you feel generally unwell. This is not uncommon, as chemotherapy can affect healthy cells as well as cancer cells. Report any side effects. Continue your course of treatment even though you feel ill unless your doctor tells you to stop. Call your doctor or health care professional for advice if you get a fever, chills or sore throat, or other symptoms of a cold or flu. Do  not treat yourself. This drug decreases your body's ability to fight infections. Try to avoid being around people who are sick. Avoid taking products that contain aspirin, acetaminophen, ibuprofen, naproxen, or ketoprofen unless instructed by your doctor. These medicines may hide a fever. Do not become pregnant while taking this medicine. Women should inform their doctor if they wish to become pregnant or think they might be pregnant. There is a potential for serious side effects to an unborn child. Talk to your health care professional or pharmacist for more information. Do not breast-feed an infant while taking this medicine. What side effects may I notice from receiving this medicine? Side effects that you should report to your doctor or health care professional as soon as possible: -allergic reactions like skin rash, itching or hives, swelling of the face, lips, or tongue -breathing problems -chest pain -confusion -cough -fast, irregular heartbeat -feeling faint or lightheaded, falls -fever or chills -mouth sores -pain, tingling, numbness in the hands or feet -trouble passing urine or change in the amount of urine -yellowing of the eyes or skin Side effects that usually do not require medical attention (report to your doctor or health care professional if they continue or are bothersome): -darker skin color -hair loss -irritation at site where injected -loss of appetite -nail changes -nausea and vomiting -weight loss This list may not describe all possible side effects. Call your doctor for medical advice about side effects. You may report side effects to FDA at 1-800-FDA-1088. Where should I keep my medicine? This drug is given in a hospital or clinic and will not be stored at home. NOTE: This sheet is a summary. It may not cover all possible information. If you have questions about this medicine, talk to your doctor, pharmacist, or health care provider.  2012, [employer]  Standard. (02/11/2008 3:24:02 PM)  Vinblastine injection What is this medicine? VINBLASTINE (vin BLAS teen) is a chemotherapy drug. It slows the growth of cancer cells. This medicine is used to treat many types of cancer like breast cancer, testicular cancer, Hodgkin's disease, non-Hodgkin's lymphoma, and sarcoma. This medicine may be used for other purposes; ask your health care provider or pharmacist if you have questions. What should I tell my health care provider before I take this medicine? They need to know if you have any of these conditions: -blood disorders -dental disease -gout -infection (especially a virus infection such as chickenpox, cold sores, or herpes) -liver disease -lung disease -nervous system disease -recent or ongoing radiation therapy -an unusual or allergic reaction to vinblastine, other chemotherapy agents, other medicines, foods, dyes, or preservatives -pregnant or trying to get pregnant -breast-feeding How should I use this medicine? This drug is given as an infusion into a vein. It is administered in a hospital or clinic by a specially trained health care professional. If you have pain, swelling, burning or any unusual feeling around the site of your injection, tell your health care professional right away. Talk to your pediatrician regarding the use of this medicine in children. While this drug may be   prescribed for selected conditions, precautions do apply. Overdosage: If you think you have taken too much of this medicine contact a poison control center or emergency room at once. NOTE: This medicine is only for you. Do not share this medicine with others. What if I miss a dose? It is important not to miss your dose. Call your doctor or health care professional if you are unable to keep an appointment. What may interact with this medicine? Do not take this medicine with any of the following  medications: -erythromycin -itraconazole -mibefradil -voriconazole This medicine may also interact with the following medications: -cyclosporine -fluconazole -ketoconazole -medicines for seizures like phenytoin -medicines to increase blood counts like filgrastim, pegfilgrastim, sargramostim -vaccines -verapamil Talk to your doctor or health care professional before taking any of these medicines: -acetaminophen -aspirin -ibuprofen -ketoprofen -naproxen This list may not describe all possible interactions. Give your health care provider a list of all the medicines, herbs, non-prescription drugs, or dietary supplements you use. Also tell them if you smoke, drink alcohol, or use illegal drugs. Some items may interact with your medicine. What should I watch for while using this medicine? Your condition will be monitored carefully while you are receiving this medicine. You will need important blood work done while you are taking this medicine. This drug may make you feel generally unwell. This is not uncommon, as chemotherapy can affect healthy cells as well as cancer cells. Report any side effects. Continue your course of treatment even though you feel ill unless your doctor tells you to stop. In some cases, you may be given additional medicines to help with side effects. Follow all directions for their use. Call your doctor or health care professional for advice if you get a fever, chills or sore throat, or other symptoms of a cold or flu. Do not treat yourself. This drug decreases your body's ability to fight infections. Try to avoid being around people who are sick. This medicine may increase your risk to bruise or bleed. Call your doctor or health care professional if you notice any unusual bleeding. Be careful brushing and flossing your teeth or using a toothpick because you may get an infection or bleed more easily. If you have any dental work done, tell your dentist you are receiving this  medicine. Avoid taking products that contain aspirin, acetaminophen, ibuprofen, naproxen, or ketoprofen unless instructed by your doctor. These medicines may hide a fever. Do not become pregnant while taking this medicine. Women should inform their doctor if they wish to become pregnant or think they might be pregnant. There is a potential for serious side effects to an unborn child. Talk to your health care professional or pharmacist for more information. Do not breast-feed an infant while taking this medicine. Men may have a lower sperm count while taking this medicine. Talk to your doctor if you plan to father a child. What side effects may I notice from receiving this medicine? Side effects that you should report to your doctor or health care professional as soon as possible: -allergic reactions like skin rash, itching or hives, swelling of the face, lips, or tongue -low blood counts - This drug may decrease the number of white blood cells, red blood cells and platelets. You may be at increased risk for infections and bleeding. -signs of infection - fever or chills, cough, sore throat, pain or difficulty passing urine -signs of decreased platelets or bleeding - bruising, pinpoint red spots on the skin, black, tarry stools, nosebleeds -signs of   decreased red blood cells - unusually weak or tired, fainting spells, lightheadedness -breathing problems -changes in hearing -change in the amount of urine -chest pain -high blood pressure -mouth sores -nausea and vomiting -pain, swelling, redness or irritation at the injection site -pain, tingling, numbness in the hands or feet -problems with balance, dizziness -seizures Side effects that usually do not require medical attention (report to your doctor or health care professional if they continue or are bothersome): -constipation -hair loss -jaw pain -loss of appetite -sensitivity to light -stomach pain -tumor pain This list may not describe  all possible side effects. Call your doctor for medical advice about side effects. You may report side effects to FDA at 1-800-FDA-1088. Where should I keep my medicine? This drug is given in a hospital or clinic and will not be stored at home. NOTE: This sheet is a summary. It may not cover all possible information. If you have questions about this medicine, talk to your doctor, pharmacist, or health care provider.  2012, [employer] Standard. (08/09/2008 5:15:59 PM)  Doxorubicin injection What is this medicine? DOXORUBICIN (dox oh ROO bi sin) is a chemotherapy drug. It is used to treat many kinds of cancer like Hodgkin's disease, leukemia, non-Hodgkin's lymphoma, neuroblastoma, sarcoma, and Wilms' tumor. It is also used to treat bladder cancer, breast cancer, lung cancer, ovarian cancer, stomach cancer, and thyroid cancer. This medicine may be used for other purposes; ask your health care provider or pharmacist if you have questions. What should I tell my health care provider before I take this medicine? They need to know if you have any of these conditions: -blood disorders -heart disease, recent heart attack -infection (especially a virus infection such as chickenpox, cold sores, or herpes) -irregular heartbeat -liver disease -recent or ongoing radiation therapy -an unusual or allergic reaction to doxorubicin, other chemotherapy agents, other medicines, foods, dyes, or preservatives -pregnant or trying to get pregnant -breast-feeding How should I use this medicine? This drug is given as an infusion into a vein. It is administered in a hospital or clinic by a specially trained health care professional. If you have pain, swelling, burning or any unusual feeling around the site of your injection, tell your health care professional right away. Talk to your pediatrician regarding the use of this medicine in children. Special care may be needed. Overdosage: If you think you have taken too  much of this medicine contact a poison control center or emergency room at once. NOTE: This medicine is only for you. Do not share this medicine with others. What if I miss a dose? It is important not to miss your dose. Call your doctor or health care professional if you are unable to keep an appointment. What may interact with this medicine? Do not take this medicine with any of the following medications: -cisapride -droperidol -halofantrine -pimozide -zidovudine This medicine may also interact with the following medications: -chloroquine -chlorpromazine -clarithromycin -cyclophosphamide -cyclosporine -erythromycin -medicines for depression, anxiety, or psychotic disturbances -medicines for irregular heart beat like amiodarone, bepridil, dofetilide, encainide, flecainide, propafenone, quinidine -medicines for seizures like ethotoin, fosphenytoin, phenytoin -medicines for nausea, vomiting like dolasetron, ondansetron, palonosetron -medicines to increase blood counts like filgrastim, pegfilgrastim, sargramostim -methadone -methotrexate -pentamidine -progesterone -vaccines -verapamil Talk to your doctor or health care professional before taking any of these medicines: -acetaminophen -aspirin -ibuprofen -ketoprofen -naproxen This list may not describe all possible interactions. Give your health care provider a list of all the medicines, herbs, non-prescription drugs, or dietary supplements you use. Also   tell them if you smoke, drink alcohol, or use illegal drugs. Some items may interact with your medicine. What should I watch for while using this medicine? Your condition will be monitored carefully while you are receiving this medicine. You will need important blood work done while you are taking this medicine. This drug may make you feel generally unwell. This is not uncommon, as chemotherapy can affect healthy cells as well as cancer cells. Report any side effects. Continue your  course of treatment even though you feel ill unless your doctor tells you to stop. Your urine may turn red for a few days after your dose. This is not blood. If your urine is dark or brown, call your doctor. In some cases, you may be given additional medicines to help with side effects. Follow all directions for their use. Call your doctor or health care professional for advice if you get a fever, chills or sore throat, or other symptoms of a cold or flu. Do not treat yourself. This drug decreases your body's ability to fight infections. Try to avoid being around people who are sick. This medicine may increase your risk to bruise or bleed. Call your doctor or health care professional if you notice any unusual bleeding. Be careful brushing and flossing your teeth or using a toothpick because you may get an infection or bleed more easily. If you have any dental work done, tell your dentist you are receiving this medicine. Avoid taking products that contain aspirin, acetaminophen, ibuprofen, naproxen, or ketoprofen unless instructed by your doctor. These medicines may hide a fever. Men and women of childbearing age should use effective birth control methods while using taking this medicine. Do not become pregnant while taking this medicine. There is a potential for serious side effects to an unborn child. Talk to your health care professional or pharmacist for more information. Do not breast-feed an infant while taking this medicine. Do not let others touch your urine or other body fluids for 5 days after each treatment with this medicine. Caregivers should wear latex gloves to avoid touching body fluids during this time. What side effects may I notice from receiving this medicine? Side effects that you should report to your doctor or health care professional as soon as possible: -allergic reactions like skin rash, itching or hives, swelling of the face, lips, or tongue -low blood counts - this medicine may  decrease the number of white blood cells, red blood cells and platelets. You may be at increased risk for infections and bleeding. -signs of infection - fever or chills, cough, sore throat, pain or difficulty passing urine -signs of decreased platelets or bleeding - bruising, pinpoint red spots on the skin, black, tarry stools, blood in the urine -signs of decreased red blood cells - unusually weak or tired, fainting spells, lightheadedness -breathing problems -chest pain -fast, irregular heartbeat -mouth sores -nausea, vomiting -pain, swelling, redness at site where injected -pain, tingling, numbness in the hands or feet -swelling of ankles, feet, or hands -unusual bleeding or bruising Side effects that usually do not require medical attention (report to your doctor or health care professional if they continue or are bothersome): -diarrhea -facial flushing -hair loss -loss of appetite -missed menstrual periods -nail discoloration or damage -red or watery eyes -red colored urine -stomach upset This list may not describe all possible side effects. Call your doctor for medical advice about side effects. You may report side effects to FDA at 1-800-FDA-1088. Where should I keep my medicine?   This drug is given in a hospital or clinic and will not be stored at home. NOTE: This sheet is a summary. It may not cover all possible information. If you have questions about this medicine, talk to your doctor, pharmacist, or health care provider.  2012, [employer] Standard. (03/02/2008 5:07:32 PM)[facility] Discharge Instructions for Patients Receiving Chemotherapy  Today you received the following chemotherapy agents ABVD (Adriamycin, Bleomycin, Velban, and DTIC)  To help prevent nausea and vomiting after your treatment, we encourage you to take your nausea medication   1) Zofran (Ondansetron) 8 mg twice a day for 3 days beginning the day after chemotherapy.  Take one in the  morning and one in the evening to help prevent nausea  2) Decadron (Dexamethasone) Take 8 mg one time daily for 3 days beginning morning after chemotherapy  3) Compazine (Prochlorperazine) Take 10 mg every 6 hours as needed for nausea.   If you develop nausea and vomiting that is not controlled by your nausea medication, call the clinic. If it is after clinic hours your family physician or the after hours number for the clinic or go to the Emergency Department.   BELOW ARE SYMPTOMS THAT SHOULD BE REPORTED IMMEDIATELY:  *FEVER GREATER THAN 100.5 F  *CHILLS WITH OR WITHOUT FEVER  NAUSEA AND VOMITING THAT IS NOT CONTROLLED WITH YOUR NAUSEA MEDICATION  *UNUSUAL SHORTNESS OF BREATH  *UNUSUAL BRUISING OR BLEEDING  TENDERNESS IN MOUTH AND THROAT WITH OR WITHOUT PRESENCE OF ULCERS  *URINARY PROBLEMS  *BOWEL PROBLEMS  UNUSUAL RASH Items with * indicate a potential emergency and should be followed up as soon as possible.  One of the nurses will contact you 24 hours after your treatment. Please let the nurse know about any problems that you may have experienced. Feel free to call the clinic [phone]   I have been informed and understand all the instructions given to me. I know to contact the clinic, my physician, or go to the Emergency Department if any problems should occur. I do not have any questions at this time, but understand that I may call the clinic during office hours   should I have any questions or need assistance in obtaining follow up care.    __________________________________________  _____________  __________ Signature of Patient or Authorized Representative            Date                   Time    __________________________________________ Nurse's Signature     

## 2012-11-27 NOTE — Progress Notes (Signed)
.  dict

## 2012-11-28 NOTE — Progress Notes (Signed)
CC:   [name]. [name], M.D. [name], M.D.  DIAGNOSIS:  Stage IIIA nodular sclerosing Hodgkin disease.  CURRENT THERAPY:  Patient is status post 2 cycles of ABVD.  INTERIM HISTORY:  Mr. Tetrick comes in for his followup.  He is doing well. He had no problems over the holidays.  We were able to schedule his chemo around Christmas and New Year's so that worked out quite well.  We did go ahead and do a repeat PET scan on him.  This did show a very nice response.  He had a very nice decrease in his lymphadenopathy. There is a nice decrease in FDG accumulation.  There is no evidence of any progressive disease.  He has had no fevers, sweats or chills.  He still has his hair, although it is "thinning."  He has had no mouth sores.  There has been no change in bowel or bladder habits. He is a little constipated after each cycle of chemo.  PHYSICAL EXAMINATION:  This is a well-developed, well-nourished white gentleman in no obvious distress.  Vital signs:  Temperature of 97.5, pulse 70, respiratory rate 18, blood pressure 111/66.  Weight is 179. Head and neck:  Normocephalic, atraumatic skull.  There are no ocular or oral lesions.  There are no palpable cervical or supraclavicular lymph nodes.  Lungs:  Clear bilaterally.  Cardiac:  Regular rate and rhythm with a normal S1, S2.  There are no murmurs, rubs or bruits.  Abdomen: Soft with good bowel sounds.  There is no palpable abdominal mass. There is no palpable hepatosplenomegaly.  Extremities:  No clubbing, cyanosis or edema.  Skin:  No rashes, ecchymosis or petechia.  LABORATORY STUDIES:  White cell count is 2.7, hemoglobin 12.9, hematocrit 38.7, platelet count 247.  IMPRESSION:  Mr. Larrabee is a 52 year old gentleman with stage IIIA nodular sclerosing Hodgkin disease.  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  Again,  his PET scan is improving nicely.  As such, I feel that we will continue him on this program of chemotherapy.  We will go ahead and start the 3rd cycle of chemo now.  His ANC is borderline but yet with his elevated monocytes, I feel that his blood counts are recovering.  We will get him back in 2 more weeks.    ______________________________ [name], M.D. PRE/MEDQ  D:  11/27/2012  T:  11/28/2012  Job:  [id number]

## 2012-12-09 ENCOUNTER — Other Ambulatory Visit: Payer: Self-pay | Admitting: Hematology & Oncology

## 2012-12-10 ENCOUNTER — Other Ambulatory Visit (HOSPITAL_BASED_OUTPATIENT_CLINIC_OR_DEPARTMENT_OTHER): Payer: Commercial Managed Care - PPO | Admitting: Lab

## 2012-12-10 ENCOUNTER — Ambulatory Visit (HOSPITAL_BASED_OUTPATIENT_CLINIC_OR_DEPARTMENT_OTHER): Payer: Commercial Managed Care - PPO

## 2012-12-10 ENCOUNTER — Ambulatory Visit (HOSPITAL_BASED_OUTPATIENT_CLINIC_OR_DEPARTMENT_OTHER): Payer: Commercial Managed Care - PPO | Admitting: Hematology & Oncology

## 2012-12-10 VITALS — BP 117/68 | HR 57 | Temp 97.9°F | Resp 18 | Ht 71.0 in | Wt 181.0 lb

## 2012-12-10 DIAGNOSIS — C8119 Nodular sclerosis classical Hodgkin lymphoma, extranodal and solid organ sites: Secondary | ICD-10-CM

## 2012-12-10 DIAGNOSIS — Z5111 Encounter for antineoplastic chemotherapy: Secondary | ICD-10-CM

## 2012-12-10 DIAGNOSIS — C8195 Hodgkin lymphoma, unspecified, lymph nodes of inguinal region and lower limb: Secondary | ICD-10-CM

## 2012-12-10 DIAGNOSIS — C8190 Hodgkin lymphoma, unspecified, unspecified site: Secondary | ICD-10-CM

## 2012-12-10 LAB — CBC WITH DIFFERENTIAL (CANCER CENTER ONLY)
BASO#: 0.1 10*3/uL (ref 0.0–0.2)
EOS%: 4.4 % (ref 0.0–7.0)
HCT: 37.6 % — ABNORMAL LOW (ref 38.7–49.9)
HGB: 12.7 g/dL — ABNORMAL LOW (ref 13.0–17.1)
LYMPH#: 0.6 10*3/uL — ABNORMAL LOW (ref 0.9–3.3)
LYMPH%: 20.1 % (ref 14.0–48.0)
MCHC: 33.8 g/dL (ref 32.0–35.9)
MCV: 84 fL (ref 82–98)
MONO#: 0.7 10*3/uL (ref 0.1–0.9)
NEUT%: 50.4 % (ref 40.0–80.0)

## 2012-12-10 LAB — COMPREHENSIVE METABOLIC PANEL
BUN: 17 mg/dL (ref 6–23)
CO2: 23 mEq/L (ref 19–32)
Calcium: 9 mg/dL (ref 8.4–10.5)
Chloride: 108 mEq/L (ref 96–112)
Creatinine, Ser: 0.84 mg/dL (ref 0.50–1.35)
Total Bilirubin: 0.5 mg/dL (ref 0.3–1.2)

## 2012-12-10 MED ORDER — ONDANSETRON 16 MG/50ML IVPB (CHCC)
16.0000 mg | Freq: Once | INTRAVENOUS | Status: AC
  Administered 2012-12-10: 16 mg via INTRAVENOUS

## 2012-12-10 MED ORDER — ALTEPLASE 2 MG IJ SOLR
2.0000 mg | Freq: Once | INTRAMUSCULAR | Status: DC | PRN
  Filled 2012-12-10: qty 2

## 2012-12-10 MED ORDER — SODIUM CHLORIDE 0.9 % IV SOLN
12.0000 mg | Freq: Once | INTRAVENOUS | Status: AC
  Administered 2012-12-10: 12 mg via INTRAVENOUS
  Filled 2012-12-10: qty 12

## 2012-12-10 MED ORDER — SODIUM CHLORIDE 0.9 % IJ SOLN
10.0000 mL | INTRAMUSCULAR | Status: DC | PRN
  Filled 2012-12-10: qty 10

## 2012-12-10 MED ORDER — SODIUM CHLORIDE 0.9 % IV SOLN
Freq: Once | INTRAVENOUS | Status: AC
  Administered 2012-12-10: 11:00:00 via INTRAVENOUS

## 2012-12-10 MED ORDER — SODIUM CHLORIDE 0.9 % IJ SOLN
3.0000 mL | INTRAMUSCULAR | Status: DC | PRN
  Filled 2012-12-10: qty 10

## 2012-12-10 MED ORDER — HEPARIN SOD (PORK) LOCK FLUSH 100 UNIT/ML IV SOLN
250.0000 [IU] | Freq: Once | INTRAVENOUS | Status: DC | PRN
  Filled 2012-12-10: qty 5

## 2012-12-10 MED ORDER — SODIUM CHLORIDE 0.9 % IV SOLN
10.0000 [IU]/m2 | Freq: Once | INTRAVENOUS | Status: AC
  Administered 2012-12-10: 20 [IU] via INTRAVENOUS
  Filled 2012-12-10: qty 6.7

## 2012-12-10 MED ORDER — HEPARIN SOD (PORK) LOCK FLUSH 100 UNIT/ML IV SOLN
500.0000 [IU] | Freq: Once | INTRAVENOUS | Status: DC | PRN
  Filled 2012-12-10: qty 5

## 2012-12-10 MED ORDER — DOXORUBICIN HCL CHEMO IV INJECTION 2 MG/ML
25.0000 mg/m2 | Freq: Once | INTRAVENOUS | Status: AC
  Administered 2012-12-10: 50 mg via INTRAVENOUS
  Filled 2012-12-10: qty 25

## 2012-12-10 MED ORDER — SODIUM CHLORIDE 0.9 % IV SOLN
375.0000 mg/m2 | Freq: Once | INTRAVENOUS | Status: AC
  Administered 2012-12-10: 740 mg via INTRAVENOUS
  Filled 2012-12-10: qty 37

## 2012-12-10 MED ORDER — DEXAMETHASONE SODIUM PHOSPHATE 4 MG/ML IJ SOLN
20.0000 mg | Freq: Once | INTRAMUSCULAR | Status: AC
  Administered 2012-12-10: 20 mg via INTRAVENOUS

## 2012-12-10 NOTE — Patient Instructions (Addendum)
Dacarbazine, DTIC injection What is this medicine? DACARBAZINE (da KAR ba zeen) is a chemotherapy drug. This medicine is used to treat skin cancer. It is also used with other medicines to treat Hodgkin's disease. This medicine may be used for other purposes; ask your health care provider or pharmacist if you have questions. What should I tell my health care provider before I take this medicine? They need to know if you have any of these conditions: -infection (especially virus infection such as chickenpox, cold sores, or herpes) -kidney disease -liver disease -low blood counts like low platelets, red blood cells, white blood cells -recent radiation therapy -an unusual or allergic reaction to dacarbazine, other chemotherapy agents, other medicines, foods, dyes, or preservatives -pregnant or trying to get pregnant -breast-feeding How should I use this medicine? This drug is given as an injection or infusion into a vein. It is administered in a hospital or clinic by a specially trained health care professional. Talk to your pediatrician regarding the use of this medicine in children. While this drug may be prescribed for selected conditions, precautions do apply. Overdosage: If you think you have taken too much of this medicine contact a poison control center or emergency room at once. NOTE: This medicine is only for you. Do not share this medicine with others. What if I miss a dose? It is important not to miss your dose. Call your doctor or health care professional if you are unable to keep an appointment. What may interact with this medicine? -medicines to increase blood counts like filgrastim, pegfilgrastim, sargramostim -vaccines This list may not describe all possible interactions. Give your health care provider a list of all the medicines, herbs, non-prescription drugs, or dietary supplements you use. Also tell them if you smoke, drink alcohol, or use illegal drugs. Some items may interact  with your medicine. What should I watch for while using this medicine? Your condition will be monitored carefully while you are receiving this medicine. You will need important blood work done while you are taking this medicine. This drug may make you feel generally unwell. This is not uncommon, as chemotherapy can affect healthy cells as well as cancer cells. Report any side effects. Continue your course of treatment even though you feel ill unless your doctor tells you to stop. Call your doctor or health care professional for advice if you get a fever, chills or sore throat, or other symptoms of a cold or flu. Do not treat yourself. This drug decreases your body's ability to fight infections. Try to avoid being around people who are sick. This medicine may increase your risk to bruise or bleed. Call your doctor or health care professional if you notice any unusual bleeding. Be careful brushing and flossing your teeth or using a toothpick because you may get an infection or bleed more easily. If you have any dental work done, tell your dentist you are receiving this medicine. Avoid taking products that contain aspirin, acetaminophen, ibuprofen, naproxen, or ketoprofen unless instructed by your doctor. These medicines may hide a fever. Do not become pregnant while taking this medicine. Women should inform their doctor if they wish to become pregnant or think they might be pregnant. There is a potential for serious side effects to an unborn child. Talk to your health care professional or pharmacist for more information. Do not breast-feed an infant while taking this medicine. What side effects may I notice from receiving this medicine? Side effects that you should report to your doctor or   health care professional as soon as possible: -allergic reactions like skin rash, itching or hives, swelling of the face, lips, or tongue -low blood counts - this medicine may decrease the number of white blood cells,  red blood cells and platelets. You may be at increased risk for infections and bleeding. -signs of infection - fever or chills, cough, sore throat, pain or difficulty passing urine -signs of decreased platelets or bleeding - bruising, pinpoint red spots on the skin, black, tarry stools, blood in the urine -signs of decreased red blood cells - unusually weak or tired, fainting spells, lightheadedness -breathing problems -muscle pains -pain at site where injected -trouble passing urine or change in the amount of urine -vomiting -yellowing of the eyes or skin Side effects that usually do not require medical attention (report to your doctor or health care professional if they continue or are bothersome): -diarrhea -hair loss -loss of appetite -nausea -skin more sensitive to sun or ultraviolet light -stomach upset This list may not describe all possible side effects. Call your doctor for medical advice about side effects. You may report side effects to FDA at 1-800-FDA-1088. Where should I keep my medicine? This drug is given in a hospital or clinic and will not be stored at home. NOTE: This sheet is a summary. It may not cover all possible information. If you have questions about this medicine, talk to your doctor, pharmacist, or health care provider.  2012, [employer]. (03/02/2008 4:56:39 PM)  Bleomycin injection What is this medicine? BLEOMYCIN (blee oh MYE sin) is a chemotherapy drug. It is used to treat many kinds of cancer like lymphoma, cervical cancer, head and neck cancer, and testicular cancer. It is also used to prevent and to treat fluid build-up around the lungs caused by some cancers. This medicine may be used for other purposes; ask your health care provider or pharmacist if you have questions. What should I tell my health care provider before I take this medicine? They need to know if you have any of these conditions: -cigarette smoker -kidney disease -lung  disease -recent or ongoing radiation therapy -an unusual or allergic reaction to bleomycin, other chemotherapy agents, other medicines, foods, dyes, or preservatives -pregnant or trying to get pregnant -breast-feeding How should I use this medicine? This drug is given as an infusion into a vein or a body cavity. It can also be given as an injection into a muscle or under the skin. It is administered in a hospital or clinic by a specially trained health care professional. Talk to your pediatrician regarding the use of this medicine in children. Special care may be needed. Overdosage: If you think you have taken too much of this medicine contact a poison control center or emergency room at once. NOTE: This medicine is only for you. Do not share this medicine with others. What if I miss a dose? It is important not to miss your dose. Call your doctor or health care professional if you are unable to keep an appointment. What may interact with this medicine? -certain antibiotics given by injection -cisplatin -cyclosporine -diuretics -foscarnet -medicines to increase blood counts like filgrastim, pegfilgrastim, sargramostim -vaccines This list may not describe all possible interactions. Give your health care provider a list of all the medicines, herbs, non-prescription drugs, or dietary supplements you use. Also tell them if you smoke, drink alcohol, or use illegal drugs. Some items may interact with your medicine. What should I watch for while using this medicine? Visit your doctor  for checks on your progress. This drug may make you feel generally unwell. This is not uncommon, as chemotherapy can affect healthy cells as well as cancer cells. Report any side effects. Continue your course of treatment even though you feel ill unless your doctor tells you to stop. Call your doctor or health care professional for advice if you get a fever, chills or sore throat, or other symptoms of a cold or flu. Do  not treat yourself. This drug decreases your body's ability to fight infections. Try to avoid being around people who are sick. Avoid taking products that contain aspirin, acetaminophen, ibuprofen, naproxen, or ketoprofen unless instructed by your doctor. These medicines may hide a fever. Do not become pregnant while taking this medicine. Women should inform their doctor if they wish to become pregnant or think they might be pregnant. There is a potential for serious side effects to an unborn child. Talk to your health care professional or pharmacist for more information. Do not breast-feed an infant while taking this medicine. What side effects may I notice from receiving this medicine? Side effects that you should report to your doctor or health care professional as soon as possible: -allergic reactions like skin rash, itching or hives, swelling of the face, lips, or tongue -breathing problems -chest pain -confusion -cough -fast, irregular heartbeat -feeling faint or lightheaded, falls -fever or chills -mouth sores -pain, tingling, numbness in the hands or feet -trouble passing urine or change in the amount of urine -yellowing of the eyes or skin Side effects that usually do not require medical attention (report to your doctor or health care professional if they continue or are bothersome): -darker skin color -hair loss -irritation at site where injected -loss of appetite -nail changes -nausea and vomiting -weight loss This list may not describe all possible side effects. Call your doctor for medical advice about side effects. You may report side effects to FDA at 1-800-FDA-1088. Where should I keep my medicine? This drug is given in a hospital or clinic and will not be stored at home. NOTE: This sheet is a summary. It may not cover all possible information. If you have questions about this medicine, talk to your doctor, pharmacist, or health care provider.  2012, [employer]  Standard. (02/11/2008 3:24:02 PM)  Vinblastine injection What is this medicine? VINBLASTINE (vin BLAS teen) is a chemotherapy drug. It slows the growth of cancer cells. This medicine is used to treat many types of cancer like breast cancer, testicular cancer, Hodgkin's disease, non-Hodgkin's lymphoma, and sarcoma. This medicine may be used for other purposes; ask your health care provider or pharmacist if you have questions. What should I tell my health care provider before I take this medicine? They need to know if you have any of these conditions: -blood disorders -dental disease -gout -infection (especially a virus infection such as chickenpox, cold sores, or herpes) -liver disease -lung disease -nervous system disease -recent or ongoing radiation therapy -an unusual or allergic reaction to vinblastine, other chemotherapy agents, other medicines, foods, dyes, or preservatives -pregnant or trying to get pregnant -breast-feeding How should I use this medicine? This drug is given as an infusion into a vein. It is administered in a hospital or clinic by a specially trained health care professional. If you have pain, swelling, burning or any unusual feeling around the site of your injection, tell your health care professional right away. Talk to your pediatrician regarding the use of this medicine in children. While this drug may be  prescribed for selected conditions, precautions do apply. Overdosage: If you think you have taken too much of this medicine contact a poison control center or emergency room at once. NOTE: This medicine is only for you. Do not share this medicine with others. What if I miss a dose? It is important not to miss your dose. Call your doctor or health care professional if you are unable to keep an appointment. What may interact with this medicine? Do not take this medicine with any of the following  medications: -erythromycin -itraconazole -mibefradil -voriconazole This medicine may also interact with the following medications: -cyclosporine -fluconazole -ketoconazole -medicines for seizures like phenytoin -medicines to increase blood counts like filgrastim, pegfilgrastim, sargramostim -vaccines -verapamil Talk to your doctor or health care professional before taking any of these medicines: -acetaminophen -aspirin -ibuprofen -ketoprofen -naproxen This list may not describe all possible interactions. Give your health care provider a list of all the medicines, herbs, non-prescription drugs, or dietary supplements you use. Also tell them if you smoke, drink alcohol, or use illegal drugs. Some items may interact with your medicine. What should I watch for while using this medicine? Your condition will be monitored carefully while you are receiving this medicine. You will need important blood work done while you are taking this medicine. This drug may make you feel generally unwell. This is not uncommon, as chemotherapy can affect healthy cells as well as cancer cells. Report any side effects. Continue your course of treatment even though you feel ill unless your doctor tells you to stop. In some cases, you may be given additional medicines to help with side effects. Follow all directions for their use. Call your doctor or health care professional for advice if you get a fever, chills or sore throat, or other symptoms of a cold or flu. Do not treat yourself. This drug decreases your body's ability to fight infections. Try to avoid being around people who are sick. This medicine may increase your risk to bruise or bleed. Call your doctor or health care professional if you notice any unusual bleeding. Be careful brushing and flossing your teeth or using a toothpick because you may get an infection or bleed more easily. If you have any dental work done, tell your dentist you are receiving this  medicine. Avoid taking products that contain aspirin, acetaminophen, ibuprofen, naproxen, or ketoprofen unless instructed by your doctor. These medicines may hide a fever. Do not become pregnant while taking this medicine. Women should inform their doctor if they wish to become pregnant or think they might be pregnant. There is a potential for serious side effects to an unborn child. Talk to your health care professional or pharmacist for more information. Do not breast-feed an infant while taking this medicine. Men may have a lower sperm count while taking this medicine. Talk to your doctor if you plan to father a child. What side effects may I notice from receiving this medicine? Side effects that you should report to your doctor or health care professional as soon as possible: -allergic reactions like skin rash, itching or hives, swelling of the face, lips, or tongue -low blood counts - This drug may decrease the number of white blood cells, red blood cells and platelets. You may be at increased risk for infections and bleeding. -signs of infection - fever or chills, cough, sore throat, pain or difficulty passing urine -signs of decreased platelets or bleeding - bruising, pinpoint red spots on the skin, black, tarry stools, nosebleeds -signs of  decreased red blood cells - unusually weak or tired, fainting spells, lightheadedness -breathing problems -changes in hearing -change in the amount of urine -chest pain -high blood pressure -mouth sores -nausea and vomiting -pain, swelling, redness or irritation at the injection site -pain, tingling, numbness in the hands or feet -problems with balance, dizziness -seizures Side effects that usually do not require medical attention (report to your doctor or health care professional if they continue or are bothersome): -constipation -hair loss -jaw pain -loss of appetite -sensitivity to light -stomach pain -tumor pain This list may not describe  all possible side effects. Call your doctor for medical advice about side effects. You may report side effects to FDA at 1-800-FDA-1088. Where should I keep my medicine? This drug is given in a hospital or clinic and will not be stored at home. NOTE: This sheet is a summary. It may not cover all possible information. If you have questions about this medicine, talk to your doctor, pharmacist, or health care provider.  2012, [employer] Standard. (08/09/2008 5:15:59 PM)  Doxorubicin injection What is this medicine? DOXORUBICIN (dox oh ROO bi sin) is a chemotherapy drug. It is used to treat many kinds of cancer like Hodgkin's disease, leukemia, non-Hodgkin's lymphoma, neuroblastoma, sarcoma, and Wilms' tumor. It is also used to treat bladder cancer, breast cancer, lung cancer, ovarian cancer, stomach cancer, and thyroid cancer. This medicine may be used for other purposes; ask your health care provider or pharmacist if you have questions. What should I tell my health care provider before I take this medicine? They need to know if you have any of these conditions: -blood disorders -heart disease, recent heart attack -infection (especially a virus infection such as chickenpox, cold sores, or herpes) -irregular heartbeat -liver disease -recent or ongoing radiation therapy -an unusual or allergic reaction to doxorubicin, other chemotherapy agents, other medicines, foods, dyes, or preservatives -pregnant or trying to get pregnant -breast-feeding How should I use this medicine? This drug is given as an infusion into a vein. It is administered in a hospital or clinic by a specially trained health care professional. If you have pain, swelling, burning or any unusual feeling around the site of your injection, tell your health care professional right away. Talk to your pediatrician regarding the use of this medicine in children. Special care may be needed. Overdosage: If you think you have taken too  much of this medicine contact a poison control center or emergency room at once. NOTE: This medicine is only for you. Do not share this medicine with others. What if I miss a dose? It is important not to miss your dose. Call your doctor or health care professional if you are unable to keep an appointment. What may interact with this medicine? Do not take this medicine with any of the following medications: -cisapride -droperidol -halofantrine -pimozide -zidovudine This medicine may also interact with the following medications: -chloroquine -chlorpromazine -clarithromycin -cyclophosphamide -cyclosporine -erythromycin -medicines for depression, anxiety, or psychotic disturbances -medicines for irregular heart beat like amiodarone, bepridil, dofetilide, encainide, flecainide, propafenone, quinidine -medicines for seizures like ethotoin, fosphenytoin, phenytoin -medicines for nausea, vomiting like dolasetron, ondansetron, palonosetron -medicines to increase blood counts like filgrastim, pegfilgrastim, sargramostim -methadone -methotrexate -pentamidine -progesterone -vaccines -verapamil Talk to your doctor or health care professional before taking any of these medicines: -acetaminophen -aspirin -ibuprofen -ketoprofen -naproxen This list may not describe all possible interactions. Give your health care provider a list of all the medicines, herbs, non-prescription drugs, or dietary supplements you use. Also  tell them if you smoke, drink alcohol, or use illegal drugs. Some items may interact with your medicine. What should I watch for while using this medicine? Your condition will be monitored carefully while you are receiving this medicine. You will need important blood work done while you are taking this medicine. This drug may make you feel generally unwell. This is not uncommon, as chemotherapy can affect healthy cells as well as cancer cells. Report any side effects. Continue your  course of treatment even though you feel ill unless your doctor tells you to stop. Your urine may turn red for a few days after your dose. This is not blood. If your urine is dark or brown, call your doctor. In some cases, you may be given additional medicines to help with side effects. Follow all directions for their use. Call your doctor or health care professional for advice if you get a fever, chills or sore throat, or other symptoms of a cold or flu. Do not treat yourself. This drug decreases your body's ability to fight infections. Try to avoid being around people who are sick. This medicine may increase your risk to bruise or bleed. Call your doctor or health care professional if you notice any unusual bleeding. Be careful brushing and flossing your teeth or using a toothpick because you may get an infection or bleed more easily. If you have any dental work done, tell your dentist you are receiving this medicine. Avoid taking products that contain aspirin, acetaminophen, ibuprofen, naproxen, or ketoprofen unless instructed by your doctor. These medicines may hide a fever. Men and women of childbearing age should use effective birth control methods while using taking this medicine. Do not become pregnant while taking this medicine. There is a potential for serious side effects to an unborn child. Talk to your health care professional or pharmacist for more information. Do not breast-feed an infant while taking this medicine. Do not let others touch your urine or other body fluids for 5 days after each treatment with this medicine. Caregivers should wear latex gloves to avoid touching body fluids during this time. What side effects may I notice from receiving this medicine? Side effects that you should report to your doctor or health care professional as soon as possible: -allergic reactions like skin rash, itching or hives, swelling of the face, lips, or tongue -low blood counts - this medicine may  decrease the number of white blood cells, red blood cells and platelets. You may be at increased risk for infections and bleeding. -signs of infection - fever or chills, cough, sore throat, pain or difficulty passing urine -signs of decreased platelets or bleeding - bruising, pinpoint red spots on the skin, black, tarry stools, blood in the urine -signs of decreased red blood cells - unusually weak or tired, fainting spells, lightheadedness -breathing problems -chest pain -fast, irregular heartbeat -mouth sores -nausea, vomiting -pain, swelling, redness at site where injected -pain, tingling, numbness in the hands or feet -swelling of ankles, feet, or hands -unusual bleeding or bruising Side effects that usually do not require medical attention (report to your doctor or health care professional if they continue or are bothersome): -diarrhea -facial flushing -hair loss -loss of appetite -missed menstrual periods -nail discoloration or damage -red or watery eyes -red colored urine -stomach upset This list may not describe all possible side effects. Call your doctor for medical advice about side effects. You may report side effects to FDA at 1-800-FDA-1088. Where should I keep my medicine?  This drug is given in a hospital or clinic and will not be stored at home. NOTE: This sheet is a summary. It may not cover all possible information. If you have questions about this medicine, talk to your doctor, pharmacist, or health care provider.  2012, [employer]. (03/02/2008 5:07:32 PM)[facility] Discharge Instructions for Patients Receiving Chemotherapy  Today you received the following chemotherapy agents ABVD (Adriamycin, Bleomycin, Velban, and DTIC)  To help prevent nausea and vomiting after your treatment, we encourage you to take your nausea medication   1) Zofran (Ondansetron) 8 mg twice a day for 3 days beginning the day after chemotherapy.  Take one in the  morning and one in the evening to help prevent nausea  2) Decadron (Dexamethasone) Take 8 mg one time daily for 3 days beginning morning after chemotherapy  3) Compazine (Prochlorperazine) Take 10 mg every 6 hours as needed for nausea.   If you develop nausea and vomiting that is not controlled by your nausea medication, call the clinic. If it is after clinic hours your family physician or the after hours number for the clinic or go to the Emergency Department.   BELOW ARE SYMPTOMS THAT SHOULD BE REPORTED IMMEDIATELY:  *FEVER GREATER THAN 100.5 F  *CHILLS WITH OR WITHOUT FEVER  NAUSEA AND VOMITING THAT IS NOT CONTROLLED WITH YOUR NAUSEA MEDICATION  *UNUSUAL SHORTNESS OF BREATH  *UNUSUAL BRUISING OR BLEEDING  TENDERNESS IN MOUTH AND THROAT WITH OR WITHOUT PRESENCE OF ULCERS  *URINARY PROBLEMS  *BOWEL PROBLEMS  UNUSUAL RASH Items with * indicate a potential emergency and should be followed up as soon as possible.  One of the nurses will contact you 24 hours after your treatment. Please let the nurse know about any problems that you may have experienced. Feel free to call the clinic [phone]   I have been informed and understand all the instructions given to me. I know to contact the clinic, my physician, or go to the Emergency Department if any problems should occur. I do not have any questions at this time, but understand that I may call the clinic during office hours   should I have any questions or need assistance in obtaining follow up care.    __________________________________________  _____________  __________ Signature of Patient or Authorized Representative            Date                   Time    __________________________________________ Nurse's Signature

## 2012-12-10 NOTE — Progress Notes (Signed)
This office note has been dictated.

## 2012-12-11 NOTE — Progress Notes (Signed)
CC:   Brian [name]. [name], M.D. [name], M.D.  DIAGNOSIS:  Stage IIIA nodular sclerosing Hodgkin disease.  CURRENT THERAPY:  The patient to complete his third cycle of ABVD today.  INTERIM HISTORY:  Brian [name] comes in for his followup.  He continues do well.  He is still working.  He has had some fatigue.  He said he ran 3 miles yesterday.  He is still going to the gym.  He is still working.  Thankfully, he does not have to travel much.  He has had no abdominal pain.  There has been no chest pain.  There has been no cough or shortness breath.  He says after each treatment he has a little bit of pruritus.  PHYSICAL EXAMINATION:  General:  This is a well-developed, well- nourished white gentleman in no obvious distress.  Vital signs: Temperature of 97.9, pulse 57, respiratory rate 18, blood pressure 117/68.  Weight is 181.  [name] and neck:  Normocephalic, atraumatic skull.  There are no ocular or oral lesions.  There are no palpable cervical or supraclavicular lymph nodes.  Lungs:  Clear bilaterally. Cardiac:  Regular rate and rhythm with normal S1 and S2.  There are no murmurs, rubs, or bruits.  Abdomen:  Soft with good bowel sounds.  There is no palpable abdominal mass.  There is no palpable hepatosplenomegaly. Back:  No tenderness over the spine, ribs, or hips.  Extremities:  No clubbing, cyanosis, or edema.  Neurological:  No focal neurological deficits.  Skin:  No rashes, ecchymosis, or petechia.  LABORATORY STUDIES:  White cell count is 3, hemoglobin is 12.7, hematocrit 37.6, platelet count 218.  IMPRESSION:  Brian [name] is a 52 year old gentleman with stage IIIA nodular sclerosing Hodgkin disease.  He has done well with chemotherapy.  We will continue him on the ABVD.  After his 4th cycle, we will plan for a PET scan.  I will go ahead and get him set up with an echocardiogram and pulmonary function tests so that we can continue to monitor these organ functions.  I  will plan to get him back in 2 more weeks.   ______________________________ [name], M.D. PRE/MEDQ  D:  12/10/2012  T:  12/11/2012  Job:  [id number]

## 2012-12-19 ENCOUNTER — Other Ambulatory Visit: Payer: Self-pay | Admitting: *Deleted

## 2012-12-19 DIAGNOSIS — L299 Pruritus, unspecified: Secondary | ICD-10-CM

## 2012-12-19 DIAGNOSIS — C8195 Hodgkin lymphoma, unspecified, lymph nodes of inguinal region and lower limb: Secondary | ICD-10-CM

## 2012-12-19 MED ORDER — HYDROXYZINE HCL 25 MG PO TABS: 25.0000 mg | ORAL_TABLET | Freq: Four times a day (QID) | ORAL | Status: DC | PRN

## 2012-12-19 NOTE — Telephone Encounter (Signed)
Pt called with c/o itching in his hands along with an area on his calf. Denies redness

## 2012-12-23 ENCOUNTER — Ambulatory Visit (HOSPITAL_COMMUNITY)
Admission: RE | Admit: 2012-12-23 | Discharge: 2012-12-23 | Disposition: A | Discharge status: Home / Self Care | Payer: Commercial Managed Care - PPO | Source: Ambulatory Visit | Attending: Hematology & Oncology | Admitting: Hematology & Oncology

## 2012-12-23 DIAGNOSIS — C8190 Hodgkin lymphoma, unspecified, unspecified site: Secondary | ICD-10-CM | POA: Insufficient documentation

## 2012-12-23 DIAGNOSIS — C8195 Hodgkin lymphoma, unspecified, lymph nodes of inguinal region and lower limb: Secondary | ICD-10-CM

## 2012-12-23 DIAGNOSIS — R634 Abnormal weight loss: Secondary | ICD-10-CM | POA: Insufficient documentation

## 2012-12-23 MED ORDER — ALBUTEROL SULFATE (5 MG/ML) 0.5% IN NEBU
2.5000 mg | INHALATION_SOLUTION | Freq: Once | RESPIRATORY_TRACT | Status: AC
  Administered 2012-12-23: 2.5 mg via RESPIRATORY_TRACT

## 2012-12-23 NOTE — Progress Notes (Signed)
*  PRELIMINARY RESULTS* Echocardiogram 2D Echocardiogram has been performed.  Brian [name] 12/23/2012, 11:18 AM

## 2012-12-24 ENCOUNTER — Ambulatory Visit (HOSPITAL_BASED_OUTPATIENT_CLINIC_OR_DEPARTMENT_OTHER): Payer: Commercial Managed Care - PPO

## 2012-12-24 ENCOUNTER — Ambulatory Visit (HOSPITAL_BASED_OUTPATIENT_CLINIC_OR_DEPARTMENT_OTHER): Payer: Commercial Managed Care - PPO | Admitting: Hematology & Oncology

## 2012-12-24 ENCOUNTER — Other Ambulatory Visit (HOSPITAL_BASED_OUTPATIENT_CLINIC_OR_DEPARTMENT_OTHER): Payer: Commercial Managed Care - PPO | Admitting: Lab

## 2012-12-24 VITALS — BP 106/75 | HR 77 | Temp 97.6°F | Resp 18 | Ht 69.0 in | Wt 181.0 lb

## 2012-12-24 DIAGNOSIS — C8190 Hodgkin lymphoma, unspecified, unspecified site: Secondary | ICD-10-CM

## 2012-12-24 DIAGNOSIS — C8195 Hodgkin lymphoma, unspecified, lymph nodes of inguinal region and lower limb: Secondary | ICD-10-CM

## 2012-12-24 DIAGNOSIS — C8119 Nodular sclerosis classical Hodgkin lymphoma, extranodal and solid organ sites: Secondary | ICD-10-CM

## 2012-12-24 DIAGNOSIS — Z5111 Encounter for antineoplastic chemotherapy: Secondary | ICD-10-CM

## 2012-12-24 LAB — COMPREHENSIVE METABOLIC PANEL
ALT: 25 U/L (ref 0–53)
BUN: 19 mg/dL (ref 6–23)
CO2: 25 mEq/L (ref 19–32)
Calcium: 9.4 mg/dL (ref 8.4–10.5)
Chloride: 108 mEq/L (ref 96–112)
Creatinine, Ser: 0.97 mg/dL (ref 0.50–1.35)
Glucose, Bld: 68 mg/dL — ABNORMAL LOW (ref 70–99)

## 2012-12-24 LAB — CBC WITH DIFFERENTIAL (CANCER CENTER ONLY)
BASO#: 0.1 10*3/uL (ref 0.0–0.2)
Eosinophils Absolute: 0.1 10*3/uL (ref 0.0–0.5)
HCT: 38.1 % — ABNORMAL LOW (ref 38.7–49.9)
HGB: 12.9 g/dL — ABNORMAL LOW (ref 13.0–17.1)
LYMPH#: 0.6 10*3/uL — ABNORMAL LOW (ref 0.9–3.3)
MONO#: 0.7 10*3/uL (ref 0.1–0.9)
NEUT%: 51.9 % (ref 40.0–80.0)
WBC: 3 10*3/uL — ABNORMAL LOW (ref 4.0–10.0)

## 2012-12-24 MED ORDER — VINBLASTINE SULFATE CHEMO INJECTION 1 MG/ML
12.0000 mg | Freq: Once | INTRAVENOUS | Status: AC
  Administered 2012-12-24: 12 mg via INTRAVENOUS
  Filled 2012-12-24: qty 12

## 2012-12-24 MED ORDER — SODIUM CHLORIDE 0.9 % IJ SOLN
10.0000 mL | INTRAMUSCULAR | Status: DC | PRN
  Filled 2012-12-24: qty 10

## 2012-12-24 MED ORDER — DEXAMETHASONE SODIUM PHOSPHATE 4 MG/ML IJ SOLN
20.0000 mg | Freq: Once | INTRAMUSCULAR | Status: AC
  Administered 2012-12-24: 20 mg via INTRAVENOUS

## 2012-12-24 MED ORDER — ALTEPLASE 2 MG IJ SOLR
2.0000 mg | Freq: Once | INTRAMUSCULAR | Status: DC | PRN
  Filled 2012-12-24: qty 2

## 2012-12-24 MED ORDER — SODIUM CHLORIDE 0.9 % IV SOLN
Freq: Once | INTRAVENOUS | Status: AC
  Administered 2012-12-24: 11:00:00 via INTRAVENOUS

## 2012-12-24 MED ORDER — SODIUM CHLORIDE 0.9 % IV SOLN
10.0000 [IU]/m2 | Freq: Once | INTRAVENOUS | Status: AC
  Administered 2012-12-24: 20 [IU] via INTRAVENOUS
  Filled 2012-12-24: qty 6.7

## 2012-12-24 MED ORDER — SODIUM CHLORIDE 0.9 % IV SOLN
375.0000 mg/m2 | Freq: Once | INTRAVENOUS | Status: AC
  Administered 2012-12-24: 740 mg via INTRAVENOUS
  Filled 2012-12-24: qty 37

## 2012-12-24 MED ORDER — SODIUM CHLORIDE 0.9 % IJ SOLN
3.0000 mL | INTRAMUSCULAR | Status: DC | PRN
  Filled 2012-12-24: qty 10

## 2012-12-24 MED ORDER — HEPARIN SOD (PORK) LOCK FLUSH 100 UNIT/ML IV SOLN
250.0000 [IU] | Freq: Once | INTRAVENOUS | Status: DC | PRN
  Filled 2012-12-24: qty 5

## 2012-12-24 MED ORDER — HEPARIN SOD (PORK) LOCK FLUSH 100 UNIT/ML IV SOLN
500.0000 [IU] | Freq: Once | INTRAVENOUS | Status: DC | PRN
  Filled 2012-12-24: qty 5

## 2012-12-24 MED ORDER — ONDANSETRON 16 MG/50ML IVPB (CHCC)
16.0000 mg | Freq: Once | INTRAVENOUS | Status: AC
  Administered 2012-12-24: 16 mg via INTRAVENOUS

## 2012-12-24 MED ORDER — DOXORUBICIN HCL CHEMO IV INJECTION 2 MG/ML
25.0000 mg/m2 | Freq: Once | INTRAVENOUS | Status: AC
  Administered 2012-12-24: 50 mg via INTRAVENOUS
  Filled 2012-12-24: qty 25

## 2012-12-24 NOTE — Progress Notes (Signed)
CC:   Brian [name]. Brian [name], M.D. Brian [name], M.D.  DIAGNOSIS:  Stage IIIA nodular sclerosing Hodgkin's disease.  CURRENT THERAPY:  Status post 3 cycles of ABVD.  INTERIM HISTORY:  Brian [name] comes in for his followup.  He still looks fairly fit.  He has lost some of his hair, but not all of it.  We repeated his pulmonary function tests and echocardiogram.  Everything looked rock stable with his scans.  His lungs and cardiac function are holding their own and are not affected by the chemo.  He has had no shortness of breath.  He has had no cough.  He has had some skin reaction to the bleomycin, but this is limited.  There is no change in bowel or bladder habits.  He has had no headache. There have been no swallowing difficulties.  He has had no nausea or vomiting.  Overall, his performance status is ECOG 0.  PHYSICAL EXAMINATION:  General:  This is a well-developed, well- nourished white gentleman in no obvious distress.  Vital signs: Temperature of 97.6, pulse 77, respiratory rate 18, blood pressure 106/75.  Weight is 181.  [name] and neck:  Normocephalic, atraumatic skull.  There are no ocular or oral lesions.  There are no palpable cervical or supraclavicular lymph nodes.  Lungs:  Clear bilaterally. There are no rales, wheezes, or rhonchi.  Cardiac:  Regular rate and rhythm, with a normal S1 and S2.  There are no murmurs, rubs or bruits. Abdomen:  Soft with good bowel sounds.  There is no palpable abdominal mass.  There is no fluid wave.  There is no palpable hepatosplenomegaly. Extremities:  No clubbing, cyanosis or edema.  He has good range of motion of his joints.  Back:  No tenderness over the spine, ribs, or hips.  Skin:  Does show some areas of maculopapular-type lesions on his extremities.  These are __________ very minimal.  Neurological:  Shows no focal neurological deficits.  LABORATORY STUDIES:  White cell count is 3, hemoglobin 13, hematocrit 38, platelet count  200,000.  IMPRESSION:  Brian [name] is a 52 year old gentleman with stage IIIA nodular sclerosing Hodgkin's disease.  He is on chemotherapy.  He tolerated ABVD very well to date.  Again, I am surprised that his hair has not totally fallen out.  We will go ahead with his treatment today.  We will then plan to get him back in 2 weeks for day 15.  I will plan for a followup PET scan after the 4th cycle of chemo has been completed.    ______________________________ Brian [name], M.D. PRE/MEDQ  D:  12/24/2012  T:  12/24/2012  Job:  [id number]

## 2012-12-24 NOTE — Progress Notes (Signed)
This office note has been dictated.

## 2012-12-24 NOTE — Patient Instructions (Addendum)
[facility] Discharge Instructions for Patients Receiving Chemotherapy  Today you received the following chemotherapy agents ABVD (Adriamycin, Bleomycin, Velban, and DTIC)  To help prevent nausea and vomiting after your treatment, we encourage you to take your nausea medication   1) Zofran (Ondansetron) 8 mg twice a day for 3 days beginning the day after chemotherapy.  Take one in the morning and one in the evening to help prevent nausea   2) Compazine (Prochlorperazine) Take 10 mg every 6 hours as needed for nausea.   If you develop nausea and vomiting that is not controlled by your nausea medication, call the clinic. If it is after clinic hours your family physician or the after hours number for the clinic or go to the Emergency Department.   BELOW ARE SYMPTOMS THAT SHOULD BE REPORTED IMMEDIATELY:  *FEVER GREATER THAN 100.5 F  *CHILLS WITH OR WITHOUT FEVER  NAUSEA AND VOMITING THAT IS NOT CONTROLLED WITH YOUR NAUSEA MEDICATION  *UNUSUAL SHORTNESS OF BREATH  *UNUSUAL BRUISING OR BLEEDING  TENDERNESS IN MOUTH AND THROAT WITH OR WITHOUT PRESENCE OF ULCERS  *URINARY PROBLEMS  *BOWEL PROBLEMS  UNUSUAL RASH Items with * indicate a potential emergency and should be followed up as soon as possible.  One of the nurses will contact you 24 hours after your treatment. Please let the nurse know about any problems that you may have experienced. Feel free to call the clinic [phone]   I have been informed and understand all the instructions given to me. I know to contact the clinic, my physician, or go to the Emergency Department if any problems should occur. I do not have any questions at this time, but understand that I may call the clinic during office hours   should I have any questions or need assistance in obtaining follow up care.    __________________________________________  _____________  __________ Signature of Patient or Authorized Representative             Date                   Time    __________________________________________ Nurse's Signature

## 2013-01-07 ENCOUNTER — Other Ambulatory Visit: Payer: Commercial Managed Care - PPO | Admitting: Lab

## 2013-01-07 ENCOUNTER — Other Ambulatory Visit (HOSPITAL_BASED_OUTPATIENT_CLINIC_OR_DEPARTMENT_OTHER): Payer: Commercial Managed Care - PPO | Admitting: Lab

## 2013-01-07 ENCOUNTER — Telehealth: Payer: Self-pay | Admitting: *Deleted

## 2013-01-07 ENCOUNTER — Ambulatory Visit (HOSPITAL_BASED_OUTPATIENT_CLINIC_OR_DEPARTMENT_OTHER): Payer: Commercial Managed Care - PPO

## 2013-01-07 ENCOUNTER — Ambulatory Visit: Payer: Commercial Managed Care - PPO

## 2013-01-07 ENCOUNTER — Ambulatory Visit (HOSPITAL_BASED_OUTPATIENT_CLINIC_OR_DEPARTMENT_OTHER): Payer: Commercial Managed Care - PPO | Admitting: Hematology & Oncology

## 2013-01-07 ENCOUNTER — Ambulatory Visit: Payer: Commercial Managed Care - PPO | Admitting: Hematology & Oncology

## 2013-01-07 VITALS — BP 117/77 | HR 90 | Temp 98.0°F | Resp 18 | Ht 69.0 in | Wt 182.0 lb

## 2013-01-07 DIAGNOSIS — C8195 Hodgkin lymphoma, unspecified, lymph nodes of inguinal region and lower limb: Secondary | ICD-10-CM

## 2013-01-07 DIAGNOSIS — C8119 Nodular sclerosis classical Hodgkin lymphoma, extranodal and solid organ sites: Secondary | ICD-10-CM

## 2013-01-07 DIAGNOSIS — Z5111 Encounter for antineoplastic chemotherapy: Secondary | ICD-10-CM

## 2013-01-07 LAB — CBC WITH DIFFERENTIAL (CANCER CENTER ONLY)
BASO%: 2.2 % — ABNORMAL HIGH (ref 0.0–2.0)
Eosinophils Absolute: 0.1 10*3/uL (ref 0.0–0.5)
HCT: 37.3 % — ABNORMAL LOW (ref 38.7–49.9)
HGB: 12.7 g/dL — ABNORMAL LOW (ref 13.0–17.1)
LYMPH#: 0.6 10*3/uL — ABNORMAL LOW (ref 0.9–3.3)
MONO#: 0.6 10*3/uL (ref 0.1–0.9)
NEUT%: 52.9 % (ref 40.0–80.0)
RBC: 4.36 10*6/uL (ref 4.20–5.70)
RDW: 17.1 % — ABNORMAL HIGH (ref 11.1–15.7)
WBC: 3.1 10*3/uL — ABNORMAL LOW (ref 4.0–10.0)

## 2013-01-07 MED ORDER — DEXAMETHASONE SODIUM PHOSPHATE 4 MG/ML IJ SOLN
20.0000 mg | Freq: Once | INTRAMUSCULAR | Status: AC
  Administered 2013-01-07: 20 mg via INTRAVENOUS

## 2013-01-07 MED ORDER — DOXORUBICIN HCL CHEMO IV INJECTION 2 MG/ML
25.0000 mg/m2 | Freq: Once | INTRAVENOUS | Status: AC
  Administered 2013-01-07: 50 mg via INTRAVENOUS
  Filled 2013-01-07: qty 25

## 2013-01-07 MED ORDER — SODIUM CHLORIDE 0.9 % IV SOLN
Freq: Once | INTRAVENOUS | Status: AC
  Administered 2013-01-07: 10:00:00 via INTRAVENOUS

## 2013-01-07 MED ORDER — ONDANSETRON 16 MG/50ML IVPB (CHCC)
16.0000 mg | Freq: Once | INTRAVENOUS | Status: AC
  Administered 2013-01-07: 16 mg via INTRAVENOUS

## 2013-01-07 MED ORDER — SODIUM CHLORIDE 0.9 % IV SOLN
375.0000 mg/m2 | Freq: Once | INTRAVENOUS | Status: AC
  Administered 2013-01-07: 740 mg via INTRAVENOUS
  Filled 2013-01-07: qty 37

## 2013-01-07 MED ORDER — SODIUM CHLORIDE 0.9 % IV SOLN
10.0000 [IU]/m2 | Freq: Once | INTRAVENOUS | Status: AC
  Administered 2013-01-07: 20 [IU] via INTRAVENOUS
  Filled 2013-01-07: qty 6.7

## 2013-01-07 MED ORDER — VINBLASTINE SULFATE CHEMO INJECTION 1 MG/ML
12.0000 mg | Freq: Once | INTRAVENOUS | Status: AC
  Administered 2013-01-07: 12 mg via INTRAVENOUS
  Filled 2013-01-07: qty 12

## 2013-01-07 NOTE — Progress Notes (Signed)
This office note has been dictated.

## 2013-01-07 NOTE — Telephone Encounter (Signed)
[facility] called and the patient wanted to come to our office in [place] for his treatment. Appts made foe this afternoon. The [facility] office called and the patient doesn't want to come that late, patient moved back to the [facility] office for treatment.  JMW

## 2013-01-07 NOTE — Patient Instructions (Signed)
[facility] Discharge Instructions for Patients Receiving Chemotherapy  Today you received the following chemotherapy agents ABVD (Adriamycin, Bleomycin, Velban, and DTIC)  To help prevent nausea and vomiting after your treatment, we encourage you to take your nausea medication   1) Zofran (Ondansetron) 8 mg twice a day for 3 days beginning the day after chemotherapy.  Take one in the morning and one in the evening to help prevent nausea   2) Compazine (Prochlorperazine) Take 10 mg every 6 hours as needed for nausea.   If you develop nausea and vomiting that is not controlled by your nausea medication, call the clinic. If it is after clinic hours your family physician or the after hours number for the clinic or go to the Emergency Department.   BELOW ARE SYMPTOMS THAT SHOULD BE REPORTED IMMEDIATELY:  *FEVER GREATER THAN 100.5 F  *CHILLS WITH OR WITHOUT FEVER  NAUSEA AND VOMITING THAT IS NOT CONTROLLED WITH YOUR NAUSEA MEDICATION  *UNUSUAL SHORTNESS OF BREATH  *UNUSUAL BRUISING OR BLEEDING  TENDERNESS IN MOUTH AND THROAT WITH OR WITHOUT PRESENCE OF ULCERS  *URINARY PROBLEMS  *BOWEL PROBLEMS  UNUSUAL RASH Items with * indicate a potential emergency and should be followed up as soon as possible.  One of the nurses will contact you 24 hours after your treatment. Please let the nurse know about any problems that you may have experienced. Feel free to call the clinic [phone]   I have been informed and understand all the instructions given to me. I know to contact the clinic, my physician, or go to the Emergency Department if any problems should occur. I do not have any questions at this time, but understand that I may call the clinic during office hours   should I have any questions or need assistance in obtaining follow up care.    __________________________________________  _____________  __________ Signature of Patient or Authorized Representative             Date                   Time    __________________________________________ Nurse's Signature     

## 2013-01-08 NOTE — Progress Notes (Signed)
CC:   [name]. [name], M.D. [name], M.D.  DIAGNOSIS:  Stage IIIA nodular sclerosing Hodgkin's disease.  CURRENT THERAPY:  The patient to complete his 4th cycle of ABVD today.  RECENT HISTORY:  Brian [name] comes in for his followup.  He is doing pretty well.  He has had no complaints.  He was a little bit "down" after the last cycle.  I told him that this is not surprising.  I told that treatments are often an "emotional roller coaster."  He is very determined.  He is very proactive.  He kept exercising.  He kept working.  He does a little bit of pruritus.  I suspect this might be from bleomycin.  His performance status continues to be excellent (ECOG 0).  He has had no problems with his bowels or bladder.  He has not noticed any obvious rashes.  PHYSICAL EXAMINATION:  This is a well-developed, well-nourished white gentleman in no obvious distress.  Vital signs:  Temperature of 98, pulse 90, respiratory rate 18, blood pressure 117/77.  Weight is 182. Head/Neck:  Normocephalic, atraumatic skull.  There are no ocular or oral lesions.  There are no palpable cervical or supraclavicular lymph nodes.  Lungs:  Clear to percussion and auscultation bilaterally. Cardiac:  Regular rate and rhythm with a normal S1 and S2.  There are no murmurs, rubs or bruits.  Abdomen:  Soft with good bowel sounds.  There is no palpable abdominal mass.  There is no palpable hepatosplenomegaly. Back:  No tenderness over the spine, ribs, or hips.  Extremities:  No clubbing, cyanosis or edema.  Neurological:  No focal neurological deficits.  LABORATORY STUDIES:  White cell count is 3.1, hemoglobin 12.7, hematocrit 37.3, platelet count 274.  IMPRESSION:  Brian [name] is a 52 year old gentleman with a stage IIIA nodular sclerosing Hodgkin's disease.  He has done very well with this.  We will go ahead and complete his 4th cycle of chemotherapy today.  I will plan for a followup PET scan in about 7-10  days.  We will then get him back in 2 weeks.   ______________________________ Brian [name], M.D. PRE/MEDQ  D:  01/07/2013  T:  01/08/2013  Job:  [id number]

## 2013-01-19 ENCOUNTER — Encounter (HOSPITAL_COMMUNITY)
Admission: RE | Admit: 2013-01-19 | Discharge: 2013-01-19 | Disposition: A | Discharge status: Home / Self Care | Payer: Commercial Managed Care - PPO | Source: Ambulatory Visit | Attending: Hematology & Oncology | Admitting: Hematology & Oncology

## 2013-01-19 DIAGNOSIS — C8195 Hodgkin lymphoma, unspecified, lymph nodes of inguinal region and lower limb: Secondary | ICD-10-CM

## 2013-01-19 MED ORDER — FLUDEOXYGLUCOSE F - 18 (FDG) INJECTION
18.8000 | Freq: Once | INTRAVENOUS | Status: AC | PRN
  Administered 2013-01-19: 18.8 via INTRAVENOUS

## 2013-01-21 ENCOUNTER — Ambulatory Visit: Payer: Commercial Managed Care - PPO

## 2013-01-21 ENCOUNTER — Ambulatory Visit (HOSPITAL_BASED_OUTPATIENT_CLINIC_OR_DEPARTMENT_OTHER): Payer: Commercial Managed Care - PPO | Admitting: Hematology & Oncology

## 2013-01-21 ENCOUNTER — Ambulatory Visit (HOSPITAL_COMMUNITY)
Admission: RE | Admit: 2013-01-21 | Discharge: 2013-01-21 | Disposition: A | Discharge status: Home / Self Care | Payer: Commercial Managed Care - PPO | Source: Ambulatory Visit | Attending: Hematology & Oncology | Admitting: Hematology & Oncology

## 2013-01-21 ENCOUNTER — Other Ambulatory Visit (HOSPITAL_BASED_OUTPATIENT_CLINIC_OR_DEPARTMENT_OTHER): Payer: Commercial Managed Care - PPO | Admitting: Lab

## 2013-01-21 ENCOUNTER — Other Ambulatory Visit: Payer: Self-pay | Admitting: Hematology & Oncology

## 2013-01-21 ENCOUNTER — Inpatient Hospital Stay (HOSPITAL_BASED_OUTPATIENT_CLINIC_OR_DEPARTMENT_OTHER): Admission: RE | Admit: 2013-01-21 | Payer: Commercial Managed Care - PPO | Source: Ambulatory Visit

## 2013-01-21 VITALS — BP 129/75 | HR 73 | Temp 98.1°F | Resp 18 | Ht 69.0 in | Wt 180.0 lb

## 2013-01-21 DIAGNOSIS — R599 Enlarged lymph nodes, unspecified: Secondary | ICD-10-CM | POA: Insufficient documentation

## 2013-01-21 DIAGNOSIS — C8119 Nodular sclerosis classical Hodgkin lymphoma, extranodal and solid organ sites: Secondary | ICD-10-CM

## 2013-01-21 DIAGNOSIS — C8195 Hodgkin lymphoma, unspecified, lymph nodes of inguinal region and lower limb: Secondary | ICD-10-CM | POA: Insufficient documentation

## 2013-01-21 DIAGNOSIS — C8190 Hodgkin lymphoma, unspecified, unspecified site: Secondary | ICD-10-CM

## 2013-01-21 LAB — CBC WITH DIFFERENTIAL (CANCER CENTER ONLY)
BASO#: 0.1 10*3/uL (ref 0.0–0.2)
EOS%: 3.6 % (ref 0.0–7.0)
HCT: 38.7 % (ref 38.7–49.9)
HGB: 12.8 g/dL — ABNORMAL LOW (ref 13.0–17.1)
MCH: 29.1 pg (ref 28.0–33.4)
MCHC: 33.1 g/dL (ref 32.0–35.9)
MONO%: 26.2 % — ABNORMAL HIGH (ref 0.0–13.0)
NEUT%: 44.4 % (ref 40.0–80.0)

## 2013-01-21 NOTE — Progress Notes (Signed)
This office note has been dictated.

## 2013-01-21 NOTE — Progress Notes (Signed)
No treatment today per dr. Ennever 

## 2013-01-22 ENCOUNTER — Other Ambulatory Visit (INDEPENDENT_AMBULATORY_CARE_PROVIDER_SITE_OTHER): Payer: Self-pay | Admitting: Surgery

## 2013-01-22 DIAGNOSIS — R590 Localized enlarged lymph nodes: Secondary | ICD-10-CM

## 2013-01-22 NOTE — Progress Notes (Signed)
CC:   Brian [name]. Brian [name], M.D. Brian [name], M.D.  DIAGNOSIS:  Stage IIIA nodular sclerosing Hodgkin disease.  CURRENT THERAPY:  Patient is status post 4 cycles of ABVD.  INTERIM HISTORY:  Brian [name] comes in for followup.  Unfortunately, we may have an issue.  I repeated his PET scan.  The PET scan shows some "stable" activity in the left axilla and also in the right peritracheal region.  The activity is mild.  There are no new areas of involvement. This are really no enlarged lymph nodes.  The radiologists are not committal as to whether or not this is significant for residual disease.  We are going to have to get a biopsy, hopefully of the left axillary node.  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  I suppose this could be an issue for [place].  I really need to know whether or not he has residual Hodgkin or if this is a nonmalignant issue.  We will get him set up with an ultrasound of the left axilla.  I have spoken with Dr. Cicero Duck.  I told Dr. [name] that we need to have the lymph node removed and not just biopsied.  Otherwise, Brian [name] seems to be doing okay.  I spent about an hour with him and his wife today.  I explained what the problem was.  I showed them the PET scans.  I explained to him why we really need to find out if there is still Hodgkin or if this is a nonmalignant process.  Again, he has been doing okay.  He has been working out.  He has been exercising.  He has been really minimally affected by the chemotherapy. He has not lost all of his hair.  PHYSICAL EXAMINATION:  General:  This is a well-developed, well- nourished white gentleman in no obvious distress.  Vital signs: Temperature of 98.1, pulse 73, respiratory rate 18, blood pressure 129/75.  Weight is 180.  [name] and neck:  Normocephalic, atraumatic skull.  There are no ocular or oral lesions.  There are no palpable cervical or supraclavicular lymph nodes.   Lungs:  Clear bilaterally. Cardiac:  Regular rate and rhythm with a normal S1 and S2.  There are no murmurs, rubs, or bruits.  Axillary:  No bilateral axillary adenopathy. Abdomen:  Soft with good bowel sounds.  There is no palpable abdominal mass.  There is no fluid wave.  There is no palpable hepatosplenomegaly. Extremities:  No clubbing, cyanosis, or edema.  Skin:  No rashes, ecchymosis, or petechia.  LABORATORY STUDIES:  White cell count is 2.5, hemoglobin 12.8, hematocrit 38.7, platelet count 310.  IMPRESSION:  Brian [name] is a 52 year old gentleman with stage IIIA nodular sclerosing Hodgkin disease.  He also has a history of lymphoid hyperplasia.  For now, we are going to have to await the results of a biopsy. Hopefully, this can be done next week.  I told Brian [name] and his wife that if the biopsy does not show any Hodgkin, then we can continue him on the ABVD.  If there is evidence of residual Hodgkin disease, then he is going to need to consider a stem cell transplant.  In that case, I would change chemotherapy over to ICE.  We could, or could not, add Rituxan to this.  I would speak with Dr. Verita Schneiders at [facility].  Dr. Jacqulyn Bath is the transplant doctor who I like to use and let him know what  is going on.  Overall, I have a feeling that we are looking at a nonmalignant process with these 2 lymph node areas.  Once we know what is going on, then we can plan for the appropriate followup.    ______________________________ Brian [name], M.D. PRE/MEDQ  D:  01/21/2013  T:  01/22/2013  Job:  [id number]

## 2013-01-23 ENCOUNTER — Other Ambulatory Visit (INDEPENDENT_AMBULATORY_CARE_PROVIDER_SITE_OTHER): Payer: Self-pay | Admitting: Surgery

## 2013-01-28 ENCOUNTER — Other Ambulatory Visit (INDEPENDENT_AMBULATORY_CARE_PROVIDER_SITE_OTHER): Payer: Self-pay | Admitting: Surgery

## 2013-01-28 ENCOUNTER — Encounter (HOSPITAL_BASED_OUTPATIENT_CLINIC_OR_DEPARTMENT_OTHER): Payer: Self-pay | Admitting: *Deleted

## 2013-01-28 DIAGNOSIS — R2232 Localized swelling, mass and lump, left upper limb: Secondary | ICD-10-CM

## 2013-01-28 DIAGNOSIS — Z8571 Personal history of Hodgkin lymphoma: Secondary | ICD-10-CM

## 2013-01-29 ENCOUNTER — Encounter (INDEPENDENT_AMBULATORY_CARE_PROVIDER_SITE_OTHER): Payer: Self-pay | Admitting: Surgery

## 2013-01-29 ENCOUNTER — Ambulatory Visit (INDEPENDENT_AMBULATORY_CARE_PROVIDER_SITE_OTHER): Payer: Commercial Managed Care - PPO | Admitting: Surgery

## 2013-01-29 VITALS — BP 114/72 | HR 80 | Resp 18 | Ht 70.0 in | Wt 181.0 lb

## 2013-01-29 DIAGNOSIS — R599 Enlarged lymph nodes, unspecified: Secondary | ICD-10-CM

## 2013-01-29 DIAGNOSIS — R590 Localized enlarged lymph nodes: Secondary | ICD-10-CM

## 2013-01-29 NOTE — Patient Instructions (Signed)
I will see you Monday at the day surgery center

## 2013-01-29 NOTE — Progress Notes (Signed)
[name]       DOB: 1961/07/08           DATE: 01/29/2013       WUJ:[id number]  CC:  Chief Complaint  Patient presents with  . Pre-op Exam    pre op appt/ sx for 02/02/13    HPI: He completed chemo for Hodgkins and a follow up PET shjowed two abnormal nodes in the left axilla, Ultrasound was able to identify them and they are about 1.0 cm in size. Excisional biopsy has been requested by the oncologist to see if this is residual hodgkin's.  The patient tolerated chemo fairly well and has no symptoms today.  EXAM: Vital signs: BP 114/72  Pulse 80  Resp 18  Ht 5\' 10"  (1.778 m)  Wt 181 lb (82.101 kg)  BMI 25.97 kg/m2  General: Patient alert, oriented, NAD  VITAL SIGNS: BP 114/72  Pulse 80  Resp 18  Ht 5\' 10"  (1.778 m)  Wt 181 lb (82.101 kg)  BMI 25.97 kg/m2  GENERAL:  The patient is alert, oriented, and generally healthy-appearing, NAD. Mood and affect are normal.  HEENT:  The head is normocephalic, the eyes nonicteric, the pupils were round regular and equal. EOMs are normal. Pharynx normal. Dentition good.  NECK:  The neck is supple and there are no masses or thyromegaly.  LUNGS:  Normal respirations and clear to auscultation.  HEART:  Regular rhythm, with no murmurs rubs or gallops. Pulses are intact carotid dorsalis pedis and posterior tibial. No significant varicosities are noted.  Axilla: There is a frim about 1.0 cm node that is not tender and is movable  ABDOMEN:  Soft, flat, and nontender. No masses or organomegaly is noted. No hernias are noted. Bowel sounds are normal.    EXTREMITIES:  Good range of motion, no edema.  IMP: Hodgkins s/p chemo, with residual left axillry LN  PLAN: Wire loc excision. Discussed plans with the patient and all questions answered.

## 2013-02-02 ENCOUNTER — Ambulatory Visit
Admission: RE | Admit: 2013-02-02 | Discharge: 2013-02-02 | Disposition: A | Discharge status: Home / Self Care | Payer: Commercial Managed Care - PPO | Source: Ambulatory Visit | Attending: Surgery | Admitting: Surgery

## 2013-02-02 ENCOUNTER — Other Ambulatory Visit (INDEPENDENT_AMBULATORY_CARE_PROVIDER_SITE_OTHER): Payer: Self-pay | Admitting: Surgery

## 2013-02-02 ENCOUNTER — Encounter (HOSPITAL_BASED_OUTPATIENT_CLINIC_OR_DEPARTMENT_OTHER): Payer: Self-pay | Admitting: Certified Registered"

## 2013-02-02 ENCOUNTER — Ambulatory Visit (HOSPITAL_BASED_OUTPATIENT_CLINIC_OR_DEPARTMENT_OTHER): Payer: Commercial Managed Care - PPO | Admitting: Certified Registered"

## 2013-02-02 ENCOUNTER — Ambulatory Visit (HOSPITAL_BASED_OUTPATIENT_CLINIC_OR_DEPARTMENT_OTHER)
Admission: RE | Admit: 2013-02-02 | Discharge: 2013-02-02 | Disposition: A | Discharge status: Home / Self Care | Payer: Commercial Managed Care - PPO | Source: Ambulatory Visit | Attending: Surgery | Admitting: Surgery

## 2013-02-02 ENCOUNTER — Encounter (HOSPITAL_BASED_OUTPATIENT_CLINIC_OR_DEPARTMENT_OTHER)
Admission: RE | Disposition: A | Discharge status: Home / Self Care | Payer: Self-pay | Source: Ambulatory Visit | Attending: Surgery

## 2013-02-02 DIAGNOSIS — Z9221 Personal history of antineoplastic chemotherapy: Secondary | ICD-10-CM | POA: Insufficient documentation

## 2013-02-02 DIAGNOSIS — D759 Disease of blood and blood-forming organs, unspecified: Secondary | ICD-10-CM | POA: Insufficient documentation

## 2013-02-02 DIAGNOSIS — Z8571 Personal history of Hodgkin lymphoma: Secondary | ICD-10-CM

## 2013-02-02 DIAGNOSIS — C8114 Nodular sclerosis classical Hodgkin lymphoma, lymph nodes of axilla and upper limb: Secondary | ICD-10-CM | POA: Insufficient documentation

## 2013-02-02 DIAGNOSIS — R2232 Localized swelling, mass and lump, left upper limb: Secondary | ICD-10-CM

## 2013-02-02 DIAGNOSIS — C8195 Hodgkin lymphoma, unspecified, lymph nodes of inguinal region and lower limb: Secondary | ICD-10-CM

## 2013-02-02 DIAGNOSIS — C8194 Hodgkin lymphoma, unspecified, lymph nodes of axilla and upper limb: Secondary | ICD-10-CM

## 2013-02-02 HISTORY — PX: AXILLARY LYMPH NODE BIOPSY: SHX5737

## 2013-02-02 SURGERY — AXILLARY LYMPH NODE BIOPSY
Anesthesia: General | Site: Axilla | Laterality: Left | Wound class: Clean

## 2013-02-02 MED ORDER — ONDANSETRON HCL 4 MG/2ML IJ SOLN
INTRAMUSCULAR | Status: DC | PRN
  Administered 2013-02-02: 4 mg via INTRAVENOUS

## 2013-02-02 MED ORDER — EPHEDRINE SULFATE 50 MG/ML IJ SOLN
INTRAMUSCULAR | Status: DC | PRN
  Administered 2013-02-02: 10 mg via INTRAVENOUS

## 2013-02-02 MED ORDER — BUPIVACAINE HCL (PF) 0.25 % IJ SOLN
INTRAMUSCULAR | Status: DC | PRN
  Administered 2013-02-02: 10 mL

## 2013-02-02 MED ORDER — DEXAMETHASONE SODIUM PHOSPHATE 4 MG/ML IJ SOLN
INTRAMUSCULAR | Status: DC | PRN
  Administered 2013-02-02: 8 mg via INTRAVENOUS

## 2013-02-02 MED ORDER — PROPOFOL 10 MG/ML IV BOLUS
INTRAVENOUS | Status: DC | PRN
Start: 2013-02-02 — End: 2013-02-02
  Administered 2013-02-02: 170 mg via INTRAVENOUS
  Administered 2013-02-02: 30 mg via INTRAVENOUS

## 2013-02-02 MED ORDER — HYDROMORPHONE HCL PF 1 MG/ML IJ SOLN: 0.2500 mg | INTRAMUSCULAR | Status: DC | PRN

## 2013-02-02 MED ORDER — HYDROCODONE-ACETAMINOPHEN 5-325 MG PO TABS: 1.0000 | ORAL_TABLET | ORAL | Status: DC | PRN

## 2013-02-02 MED ORDER — FENTANYL CITRATE 0.05 MG/ML IJ SOLN: 50.0000 ug | INTRAMUSCULAR | Status: DC | PRN

## 2013-02-02 MED ORDER — PROMETHAZINE HCL 25 MG/ML IJ SOLN: 6.2500 mg | INTRAMUSCULAR | Status: DC | PRN

## 2013-02-02 MED ORDER — MIDAZOLAM HCL 5 MG/5ML IJ SOLN
INTRAMUSCULAR | Status: DC | PRN
  Administered 2013-02-02: 2 mg via INTRAVENOUS

## 2013-02-02 MED ORDER — LIDOCAINE HCL (CARDIAC) 20 MG/ML IV SOLN
INTRAVENOUS | Status: DC | PRN
  Administered 2013-02-02: 100 mg via INTRAVENOUS

## 2013-02-02 MED ORDER — FENTANYL CITRATE 0.05 MG/ML IJ SOLN
INTRAMUSCULAR | Status: DC | PRN
  Administered 2013-02-02: 100 ug via INTRAVENOUS
  Administered 2013-02-02: 25 ug via INTRAVENOUS

## 2013-02-02 MED ORDER — OXYCODONE HCL 5 MG/5ML PO SOLN: 5.0000 mg | Freq: Once | ORAL | Status: DC | PRN

## 2013-02-02 MED ORDER — LACTATED RINGERS IV SOLN
INTRAVENOUS | Status: DC
  Administered 2013-02-02 (×2): via INTRAVENOUS

## 2013-02-02 MED ORDER — MIDAZOLAM HCL 2 MG/2ML IJ SOLN: 1.0000 mg | INTRAMUSCULAR | Status: DC | PRN

## 2013-02-02 MED ORDER — CEFAZOLIN SODIUM-DEXTROSE 2-3 GM-% IV SOLR
2.0000 g | INTRAVENOUS | Status: AC
  Administered 2013-02-02: 2 g via INTRAVENOUS

## 2013-02-02 MED ORDER — OXYCODONE HCL 5 MG PO TABS: 5.0000 mg | ORAL_TABLET | Freq: Once | ORAL | Status: DC | PRN

## 2013-02-02 SURGICAL SUPPLY — 54 items
APPLIER CLIP 9.375 MED OPEN (MISCELLANEOUS)
BIOPATCH RED 1 DISK 7.0 (GAUZE/BANDAGES/DRESSINGS) IMPLANT
BLADE HEX COATED 2.75 (ELECTRODE) IMPLANT
BLADE SURG 15 STRL LF DISP TIS (BLADE) ×1 IMPLANT
BLADE SURG 15 STRL SS (BLADE) ×1
BLADE SURG ROTATE 9660 (MISCELLANEOUS) ×2 IMPLANT
CANISTER SUCTION 1200CC (MISCELLANEOUS) ×2 IMPLANT
CHLORAPREP W/TINT 26ML (MISCELLANEOUS) ×2 IMPLANT
CLIP APPLIE 9.375 MED OPEN (MISCELLANEOUS) IMPLANT
CLOTH BEACON ORANGE TIMEOUT ST (SAFETY) ×2 IMPLANT
COVER MAYO STAND STRL (DRAPES) ×2 IMPLANT
COVER TABLE BACK 60X90 (DRAPES) ×2 IMPLANT
DECANTER SPIKE VIAL GLASS SM (MISCELLANEOUS) ×2 IMPLANT
DERMABOND ADVANCED (GAUZE/BANDAGES/DRESSINGS) ×1
DERMABOND ADVANCED .7 DNX12 (GAUZE/BANDAGES/DRESSINGS) ×1 IMPLANT
DRAIN CHANNEL 19F RND (DRAIN) IMPLANT
DRAIN PENROSE 1/4X12 LTX STRL (WOUND CARE) IMPLANT
DRAPE LAPAROSCOPIC ABDOMINAL (DRAPES) IMPLANT
DRAPE PED LAPAROTOMY (DRAPES) ×2 IMPLANT
DRAPE SURG 17X23 STRL (DRAPES) ×2 IMPLANT
DRAPE UTILITY XL STRL (DRAPES) ×2 IMPLANT
ELECT REM PT RETURN 9FT ADLT (ELECTROSURGICAL) ×2
ELECTRODE REM PT RTRN 9FT ADLT (ELECTROSURGICAL) ×1 IMPLANT
EVACUATOR SILICONE 100CC (DRAIN) IMPLANT
GLOVE BIO SURGEON STRL SZ 6.5 (GLOVE) ×4 IMPLANT
GLOVE BIOGEL PI IND STRL 7.0 (GLOVE) ×1 IMPLANT
GLOVE BIOGEL PI INDICATOR 7.0 (GLOVE) ×1
GLOVE EUDERMIC 7 POWDERFREE (GLOVE) ×2 IMPLANT
GLOVE EXAM NITRILE MD LF STRL (GLOVE) ×2 IMPLANT
GOWN PREVENTION PLUS XLARGE (GOWN DISPOSABLE) ×6 IMPLANT
NEEDLE HYPO 25X1 1.5 SAFETY (NEEDLE) ×2 IMPLANT
NS IRRIG 1000ML POUR BTL (IV SOLUTION) ×2 IMPLANT
PACK BASIN DAY SURGERY FS (CUSTOM PROCEDURE TRAY) ×2 IMPLANT
PENCIL BUTTON HOLSTER BLD 10FT (ELECTRODE) ×2 IMPLANT
PIN SAFETY STERILE (MISCELLANEOUS) IMPLANT
SHEET MEDIUM DRAPE 40X70 STRL (DRAPES) IMPLANT
SLEEVE SCD COMPRESS KNEE MED (MISCELLANEOUS) ×2 IMPLANT
SPONGE INTESTINAL PEANUT (DISPOSABLE) ×2 IMPLANT
SPONGE LAP 4X18 X RAY DECT (DISPOSABLE) ×2 IMPLANT
STAPLER VISISTAT 35W (STAPLE) IMPLANT
SUT ETHILON 2 0 FS 18 (SUTURE) IMPLANT
SUT MNCRL AB 4-0 PS2 18 (SUTURE) ×2 IMPLANT
SUT SILK 3 0 TIES 17X18 (SUTURE)
SUT SILK 3-0 18XBRD TIE BLK (SUTURE) IMPLANT
SUT VIC AB 3-0 54X BRD REEL (SUTURE) IMPLANT
SUT VIC AB 3-0 BRD 54 (SUTURE)
SUT VICRYL 3-0 CR8 SH (SUTURE) ×2 IMPLANT
SYR BULB 3OZ (MISCELLANEOUS) IMPLANT
SYR CONTROL 10ML LL (SYRINGE) ×2 IMPLANT
TOWEL OR 17X24 6PK STRL BLUE (TOWEL DISPOSABLE) ×2 IMPLANT
TOWEL OR NON WOVEN STRL DISP B (DISPOSABLE) ×2 IMPLANT
TUBE CONNECTING 20X1/4 (TUBING) ×2 IMPLANT
WATER STERILE IRR 1000ML POUR (IV SOLUTION) IMPLANT
YANKAUER SUCT BULB TIP NO VENT (SUCTIONS) ×2 IMPLANT

## 2013-02-02 NOTE — Transfer of Care (Signed)
Immediate Anesthesia Transfer of Care Note  Patient: Brian [name]  Procedure(s) Performed: Procedure(s): NEEDLE LOCALIZED AXILLARY LYMPH NODE BIOPSY (Left)  Patient Location: PACU  Anesthesia Type:General  Level of Consciousness: awake, alert , oriented and patient cooperative  Airway & Oxygen Therapy: Patient Spontanous Breathing and Patient connected to face mask oxygen  Post-op Assessment: Report given to PACU RN and Post -op Vital signs reviewed and stable  Post vital signs: Reviewed and stable  Complications: No apparent anesthesia complications

## 2013-02-02 NOTE — Anesthesia Procedure Notes (Signed)
Procedure Name: LMA Insertion Date/Time: 02/02/2013 9:20 AM Performed by: Verlan Friends Pre-anesthesia Checklist: Patient identified, Emergency Drugs available, Suction available, Patient being monitored and Timeout performed Patient Re-evaluated:Patient Re-evaluated prior to inductionOxygen Delivery Method: Circle System Utilized Preoxygenation: Pre-oxygenation with 100% oxygen Intubation Type: IV induction Ventilation: Mask ventilation without difficulty LMA: LMA inserted LMA Size: 5.0 Number of attempts: 1 Airway Equipment and Method: bite block Placement Confirmation: positive ETCO2 Tube secured with: Tape Dental Injury: Teeth and Oropharynx as per pre-operative assessment

## 2013-02-02 NOTE — Op Note (Signed)
Brian [name] 01-22-1961 [id number] 01/23/2013  Preoperative diagnosis: Abnormal left axillary lymph nodes, s/p chemo for Hodgkins  Postoperative diagnosis: Same  Procedure: Wire localized excision abnormal lymph node (2)  Surgeon: [name], MD, FACS  Assistant: Ralene Muskrat, PA-S   Anesthesia: General   Clinical History and Indications: This patient has completed a course of chemo for HOdgkin's and PET scan showed abnormal LN X2, left axilla. Excision for path was recommended by the oncologist and the patient agreed    Description of Procedure: I saw the patient in the pre-op area and confirmed the plans and marked the Left axilla as the operative site.  He was taken to the OR and after satisfactory GA was obtained the left axilla was prepped and draped and the time out done.  A transverse incision was made and a self-retain retractor placed. I followed the tract of the guide wire and got to the end. It was not in a lymph node, but at the tip was an enlarged hard node. This was excised, using clamps and ties on all attachment. When it was removed a second abnormal node was readily apparent and it was removed in like fashion. There were several apparently normal nodes noted as well.  The area was infiltrated with 0.25% plain marcaine, checked for hemostasis and closed in layers with 3-0 Vicryl, 4-0 Monocryl and Dermabond.  The patient tolerated the procedure well, no complications, minimal blood loss, all counts correct.  [name], MD, FACS 02/02/2013 10:07 AM

## 2013-02-02 NOTE — H&P (View-Only) (Signed)
NAME: Brian [name]       DOB: 08/16/1961           DATE: 01/29/2013       MRN:[id number]  CC:  Chief Complaint  Patient presents with  . Pre-op Exam    pre op appt/ sx for 02/02/13    HPI: He completed chemo for Hodgkins and a follow up PET shjowed two abnormal nodes in the left axilla, Ultrasound was able to identify them and they are about 1.0 cm in size. Excisional biopsy has been requested by the oncologist to see if this is residual hodgkin's.  The patient tolerated chemo fairly well and has no symptoms today.  EXAM: Vital signs: BP 114/72  Pulse 80  Resp 18  Ht 5' 10" (1.778 m)  Wt 181 lb (82.101 kg)  BMI 25.97 kg/m2  General: Patient alert, oriented, NAD  VITAL SIGNS: BP 114/72  Pulse 80  Resp 18  Ht 5' 10" (1.778 m)  Wt 181 lb (82.101 kg)  BMI 25.97 kg/m2  GENERAL:  The patient is alert, oriented, and generally healthy-appearing, NAD. Mood and affect are normal.  HEENT:  The head is normocephalic, the eyes nonicteric, the pupils were round regular and equal. EOMs are normal. Pharynx normal. Dentition good.  NECK:  The neck is supple and there are no masses or thyromegaly.  LUNGS:  Normal respirations and clear to auscultation.  HEART:  Regular rhythm, with no murmurs rubs or gallops. Pulses are intact carotid dorsalis pedis and posterior tibial. No significant varicosities are noted.  Axilla: There is a frim about 1.0 cm node that is not tender and is movable  ABDOMEN:  Soft, flat, and nontender. No masses or organomegaly is noted. No hernias are noted. Bowel sounds are normal.    EXTREMITIES:  Good range of motion, no edema.  IMP: Hodgkins s/p chemo, with residual left axillry LN  PLAN: Wire loc excision. Discussed plans with the patient and all questions answered.  

## 2013-02-02 NOTE — Anesthesia Preprocedure Evaluation (Signed)
Anesthesia Evaluation  Patient identified by MRN, date of birth, ID band Patient awake    Reviewed: Allergy & Precautions, H&P , NPO status , Patient's Chart, lab work & pertinent test results  Airway Mallampati: I TM Distance: >3 FB Neck ROM: Full    Dental  (+) Teeth Intact and Dental Advisory Given   Pulmonary  breath sounds clear to auscultation        Cardiovascular Rhythm:Regular Rate:Normal     Neuro/Psych Bell's palsy    GI/Hepatic   Endo/Other    Renal/GU      Musculoskeletal   Abdominal   Peds  Hematology  (+) Blood dyscrasia, , Hodgkin's lymphoma   Anesthesia Other Findings   Reproductive/Obstetrics                           Anesthesia Physical Anesthesia Plan  ASA: III  Anesthesia Plan: General   Post-op Pain Management:    Induction: Intravenous  Airway Management Planned: LMA  Additional Equipment:   Intra-op Plan:   Post-operative Plan: Extubation in OR  Informed Consent: I have reviewed the patients History and Physical, chart, labs and discussed the procedure including the risks, benefits and alternatives for the proposed anesthesia with the patient or authorized representative who has indicated his/her understanding and acceptance.     Plan Discussed with: CRNA and Surgeon  Anesthesia Plan Comments:         Anesthesia Quick Evaluation

## 2013-02-02 NOTE — Anesthesia Postprocedure Evaluation (Signed)
  Anesthesia Post-op Note  Patient: Brian [name]  Procedure(s) Performed: Procedure(s): NEEDLE LOCALIZED AXILLARY LYMPH NODE BIOPSY (Left)  Patient Location: PACU  Anesthesia Type:General  Level of Consciousness: awake  Airway and Oxygen Therapy: Patient Spontanous Breathing  Post-op Pain: mild  Post-op Assessment: Post-op Vital signs reviewed, Patient's Cardiovascular Status Stable, Respiratory Function Stable, Patent Airway, No signs of Nausea or vomiting and Pain level controlled  Post-op Vital Signs: stable  Complications: No apparent anesthesia complications

## 2013-02-02 NOTE — Interval H&P Note (Signed)
History and Physical Interval Note:  02/02/2013 8:53 AM  Brian [name]  has presented today for surgery, with the diagnosis of enlarged left axillary lymph node, history of Hodgkins  The various methods of treatment have been discussed with the patient and family. After consideration of risks, benefits and other options for treatment, the patient has consented to  Wire localized excision of left axillary lymph node as a surgical intervention .  The patient's history has been reviewed, patient examined, no change in status, stable for surgery.  I have reviewed the patient's chart and labs.  Questions were answered to the patient's satisfaction. The left axilla is marked as the surgical side and the wire loc sono reviewed    STRECK,CHRISTIAN J

## 2013-02-03 ENCOUNTER — Encounter (HOSPITAL_BASED_OUTPATIENT_CLINIC_OR_DEPARTMENT_OTHER): Payer: Self-pay | Admitting: Surgery

## 2013-02-03 ENCOUNTER — Telehealth (INDEPENDENT_AMBULATORY_CARE_PROVIDER_SITE_OTHER): Payer: Self-pay | Admitting: General Surgery

## 2013-02-03 ENCOUNTER — Telehealth (INDEPENDENT_AMBULATORY_CARE_PROVIDER_SITE_OTHER): Payer: Self-pay

## 2013-02-03 ENCOUNTER — Ambulatory Visit: Payer: Commercial Managed Care - PPO

## 2013-02-03 ENCOUNTER — Ambulatory Visit: Payer: Commercial Managed Care - PPO | Admitting: Medical

## 2013-02-03 ENCOUNTER — Other Ambulatory Visit: Payer: Commercial Managed Care - PPO | Admitting: Lab

## 2013-02-03 NOTE — Telephone Encounter (Signed)
Pt called wanting to know if he can use powder under the axil where his surgery was done. Pt advised no powder,oinment,lotions or deodorant at surgical site until wound have completely healed. Pt states he understands.

## 2013-02-03 NOTE — Telephone Encounter (Signed)
Spoke with patient he has po f/u visit for 02/20/13 12:30 . Patient ask if he should or should not be running I advised not to for 1 week as per what Dr [name]  Had advised.

## 2013-02-05 ENCOUNTER — Telehealth (INDEPENDENT_AMBULATORY_CARE_PROVIDER_SITE_OTHER): Payer: Self-pay | Admitting: Surgery

## 2013-02-05 ENCOUNTER — Telehealth (INDEPENDENT_AMBULATORY_CARE_PROVIDER_SITE_OTHER): Payer: Self-pay | Admitting: *Deleted

## 2013-02-05 NOTE — Telephone Encounter (Signed)
Called patient and discussed path. He will follow up with Dr Myna Hidalgo.

## 2013-02-05 NOTE — Telephone Encounter (Signed)
Patient called to ask for path results.  Took results to [facility] MD to have him call patient with results.

## 2013-02-10 ENCOUNTER — Other Ambulatory Visit (HOSPITAL_BASED_OUTPATIENT_CLINIC_OR_DEPARTMENT_OTHER): Payer: Commercial Managed Care - PPO | Admitting: Lab

## 2013-02-10 ENCOUNTER — Other Ambulatory Visit: Payer: Self-pay | Admitting: Oncology

## 2013-02-10 ENCOUNTER — Ambulatory Visit (HOSPITAL_BASED_OUTPATIENT_CLINIC_OR_DEPARTMENT_OTHER): Payer: Commercial Managed Care - PPO | Admitting: Oncology

## 2013-02-10 ENCOUNTER — Telehealth: Payer: Self-pay | Admitting: Oncology

## 2013-02-10 VITALS — BP 123/78 | HR 70 | Temp 97.5°F | Resp 20 | Ht 70.0 in | Wt 180.5 lb

## 2013-02-10 DIAGNOSIS — C8195 Hodgkin lymphoma, unspecified, lymph nodes of inguinal region and lower limb: Secondary | ICD-10-CM

## 2013-02-10 DIAGNOSIS — C8119 Nodular sclerosis classical Hodgkin lymphoma, extranodal and solid organ sites: Secondary | ICD-10-CM

## 2013-02-10 DIAGNOSIS — C8190 Hodgkin lymphoma, unspecified, unspecified site: Secondary | ICD-10-CM

## 2013-02-10 LAB — CBC WITH DIFFERENTIAL/PLATELET
Eosinophils Absolute: 0.1 10*3/uL (ref 0.0–0.5)
MONO#: 0.9 10*3/uL (ref 0.1–0.9)
NEUT#: 4.5 10*3/uL (ref 1.5–6.5)
RBC: 4.57 10*6/uL (ref 4.20–5.82)
RDW: 16.6 % — ABNORMAL HIGH (ref 11.0–14.6)
WBC: 6.5 10*3/uL (ref 4.0–10.3)
lymph#: 0.9 10*3/uL (ref 0.9–3.3)

## 2013-02-10 LAB — COMPREHENSIVE METABOLIC PANEL (CC13)
Albumin: 3.8 g/dL (ref 3.5–5.0)
Alkaline Phosphatase: 76 U/L (ref 40–150)
CO2: 26 mEq/L (ref 22–29)
Glucose: 95 mg/dl (ref 70–99)
Potassium: 4.3 mEq/L (ref 3.5–5.1)
Sodium: 138 mEq/L (ref 136–145)
Total Protein: 7.1 g/dL (ref 6.4–8.3)

## 2013-02-10 NOTE — Progress Notes (Signed)
Educational materials given and explained to patient and wife. re: ICE chemotherapy treatment to start tomorrow. All of their questions were answered.

## 2013-02-10 NOTE — Progress Notes (Signed)
CC:   Brian [name]. Brian [name], M.D. Brian [name], M.D.  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  CURRENT THERAPY:  He is status post 4 cycles of ABVD.  HISTORY OF PRESENT ILLNESS:  Brian [name] is a pleasant 52 year old gentleman patient of Brian [name]; I am seeing him on his behalf during his absence.  He is a very pleasant gentleman, a native of [place], [place]. area and currently works as an Programmer, multimedia.  A very healthy gentleman without really any significant comorbid condition, presented with a left inguinal lymphadenopathy and underwent left inguinal lymph node done by Dr. Cyndia Bent and showed a nodular sclerosing Hodgkin's disease. His staging at that time included a PET-CT scan which showed diffuse disease above and below the diaphragm and his bone marrow was negative. His echocardiogram showed an ejection fraction 50%.  He had normal pulmonary function test at that time.  He did very well with ABVD so far without really any major toxicity.  He had 2 PET-CT scans done initially December 27.  His interim PET-CT scan showed overall improvement in his lymphadenopathy and repeat PET images on 02/24 continued to have persistent disease in the left axilla in the mediastinum.  Based on these findings the patient had a repeat biopsy of his left axilla that was done by Dr. [name] on 02/02/2013.  That is case number [id number], which showed persistent classical Hodgkin's disease.  Based on that Brian [name] had conversation with [facility] stem cell transplant department, Dr. Jacqulyn Bath, and felt that probably the patient might require a stem cell transplant and recommended switching his chemotherapy to ICE regimen.  For that reason I brought in Brian [name] today to discuss with him this regimen and the treatment since Brian [name] is not available.  Upon interviewing Brian [name] he is a very  nice, healthy-appearing gentleman.  Did not have any complaints.  Did not report any lymphadenopathy.  Did not report any fevers.  Did not report any chills. Very active physically.  Had not had any constitutional symptoms.  REVIEW OF SYSTEMS:  Does not report any headaches, blurred vision, double vision.  Does not report any motor or sensory neuropathy.  Does not report any alteration in mental status.  Does not report any psychiatric issues or depression.  Does not report any fevers, chills, sweats.  Does not report any cough, hemoptysis, hematemesis.  No nausea, vomiting, abdominal pain, hematochezia, melena or genitourinary complaints.  Rest of review of systems unremarkable.  MEDICATIONS:  Reviewed today, and currently unchanged.  He really barely takes any antiemetics.  He is prescribed Xanax as needed.  He takes multivitamin.  Atarax as needed, lorazepam as needed, multivitamin and Zofran as needed.  ALLERGIES:  Reviewed today and he has no allergies.  PHYSICAL EXAMINATION:  General:  Alert awake, pleasant appearing gentleman appeared in no active distress.  Vital signs:  His blood pressure is 123/78, pulse 70, respiration 20, temp is 97.  His ECOG performance status is 0.  His body surface area 2.01, weighs 180 pounds. HEENT:  [name] is normocephalic, atraumatic.  Pupils equal, round, reactive to light.  Oral mucosa moist and pink.  Neck:  Supple without lymphadenopathy.  Heart:  Regular rate and rhythm.  S1, S2.  Lungs: Clear to auscultation without rhonchi, wheeze, dullness to percussion. Abdomen:  Soft, nontender.  No hepatosplenomegaly.  Extremities:  No clubbing, cyanosis or edema.  Neurological:  Intact  motor, sensory and deep tendon reflexes.  LABORATORY DATA:  Reviewed today showed a hemoglobin of 13, white cell count of 6.5, platelet count of 215.  Chemistries showed a creatinine of 1.1.  Normal liver function tests.  Bilirubin is 0.7.  ASSESSMENT AND PLAN:  A  52 year old gentleman with the following issues: Stage IIIA Hodgkin's disease status post 4 cycles of ABVD which appears to be developing more of a primary refractory disease.  I discussed with him the role of salvage chemotherapy and possibly stem cell transplant today in detail today.  He had a lot of questions regarding different approaches including continuing on ABVD versus switching the regimen and I do concur with Brian [name] that switching the regimen would be reasonable.  I talked to him about using ICE without rituximab at this point; the combination of ifosfamide, carboplatin and etoposide was discussed today in details.  Toxicity that includes nausea, vomiting, myelosuppression, renal toxicity, hemorrhagic cystitis, neutropenia, neutropenic fever, neutropenic sepsis, possible need for growth factor support, need for hospitalization, possible need for intravenous antibiotics and rarely severe morbidities and severe illness.  I think given his age, excellent performance status, excellent kidney function I think it is reasonable to consider this regimen as an outpatient.  I anticipate to start that in the next day.  He will receive day 1 hopefully on 03/19 and day 2 will be due on day 20 and day 3 will be day 21.  He will receive Neulasta on 03/22.  He will follow up with Brian [name] after that and decide on further treatments.  He already has antiemetics at home.  He has excellent veins.  We will use his peripheral vein at least for the time being.  I encouraged hydration to keep his renal function and bladder in reasonably good shape and to avoid any other toxicity.  Also given him instructions about developing fevers, chills, night sweats, and also sepsis precautions as well as neutropenic precautions.  I will also give him written information about this three drug regimen.    ______________________________ Benjiman Core, M.D. FNS/MEDQ  D:  02/10/2013  T:  02/10/2013   Job:  [id number]

## 2013-02-10 NOTE — Progress Notes (Signed)
Note dictated

## 2013-02-11 ENCOUNTER — Ambulatory Visit (HOSPITAL_BASED_OUTPATIENT_CLINIC_OR_DEPARTMENT_OTHER): Payer: Commercial Managed Care - PPO

## 2013-02-11 VITALS — BP 113/77 | HR 68 | Temp 97.7°F | Resp 16

## 2013-02-11 DIAGNOSIS — C8119 Nodular sclerosis classical Hodgkin lymphoma, extranodal and solid organ sites: Secondary | ICD-10-CM

## 2013-02-11 DIAGNOSIS — C8195 Hodgkin lymphoma, unspecified, lymph nodes of inguinal region and lower limb: Secondary | ICD-10-CM

## 2013-02-11 DIAGNOSIS — Z5111 Encounter for antineoplastic chemotherapy: Secondary | ICD-10-CM

## 2013-02-11 MED ORDER — SODIUM CHLORIDE 0.9 % IV SOLN
100.0000 mg/m2 | Freq: Once | INTRAVENOUS | Status: AC
  Administered 2013-02-11: 200 mg via INTRAVENOUS
  Filled 2013-02-11: qty 10

## 2013-02-11 MED ORDER — SODIUM CHLORIDE 0.9 % IV SOLN
Freq: Once | INTRAVENOUS | Status: AC
  Administered 2013-02-11: 09:00:00 via INTRAVENOUS

## 2013-02-11 MED ORDER — DEXAMETHASONE SODIUM PHOSPHATE 10 MG/ML IJ SOLN
10.0000 mg | Freq: Once | INTRAMUSCULAR | Status: AC
  Administered 2013-02-11: 10 mg via INTRAVENOUS

## 2013-02-11 MED ORDER — SODIUM CHLORIDE 0.9 % IV SOLN
Freq: Once | INTRAVENOUS | Status: AC
  Administered 2013-02-11: 13:00:00 via INTRAVENOUS
  Filled 2013-02-11: qty 60

## 2013-02-11 MED ORDER — ONDANSETRON 8 MG/50ML IVPB (CHCC)
8.0000 mg | Freq: Once | INTRAVENOUS | Status: AC
  Administered 2013-02-11: 8 mg via INTRAVENOUS

## 2013-02-11 MED ORDER — SODIUM CHLORIDE 0.9 % IV SOLN
400.0000 mg/m2 | Freq: Once | INTRAVENOUS | Status: AC
  Administered 2013-02-11: 800 mg via INTRAVENOUS
  Filled 2013-02-11: qty 8

## 2013-02-11 NOTE — Patient Instructions (Addendum)
[facility] Discharge Instructions for Patients Receiving Chemotherapy  Today you received the following chemotherapy agents Etoposide, Mesna, Ifex  To help prevent nausea and vomiting after your treatment, we encourage you to take your nausea medication as ordered.   If you develop nausea and vomiting that is not controlled by your nausea medication, call the clinic. If it is after clinic hours your family physician or the after hours number for the clinic or go to the Emergency Department.   BELOW ARE SYMPTOMS THAT SHOULD BE REPORTED IMMEDIATELY:  *FEVER GREATER THAN 100.5 F  *CHILLS WITH OR WITHOUT FEVER  NAUSEA AND VOMITING THAT IS NOT CONTROLLED WITH YOUR NAUSEA MEDICATION  *UNUSUAL SHORTNESS OF BREATH  *UNUSUAL BRUISING OR BLEEDING  TENDERNESS IN MOUTH AND THROAT WITH OR WITHOUT PRESENCE OF ULCERS  *URINARY PROBLEMS  *BOWEL PROBLEMS  UNUSUAL RASH Items with * indicate a potential emergency and should be followed up as soon as possible.  One of the nurses will contact you 24 hours after your treatment. Please let the nurse know about any problems that you may have experienced. Feel free to call the clinic you have any questions or concerns. The clinic phone number is [phone].   I have been informed and understand all the instructions given to me. I know to contact the clinic, my physician, or go to the Emergency Department if any problems should occur. I do not have any questions at this time, but understand that I may call the clinic during office hours   should I have any questions or need assistance in obtaining follow up care.    __________________________________________  _____________  __________ Signature of Patient or Authorized Representative            Date                   Time    __________________________________________ Nurse's Signature

## 2013-02-12 ENCOUNTER — Ambulatory Visit (HOSPITAL_BASED_OUTPATIENT_CLINIC_OR_DEPARTMENT_OTHER): Payer: Commercial Managed Care - PPO

## 2013-02-12 ENCOUNTER — Other Ambulatory Visit: Payer: Self-pay | Admitting: Certified Registered Nurse Anesthetist

## 2013-02-12 VITALS — BP 117/70 | HR 70 | Temp 97.0°F | Resp 20

## 2013-02-12 DIAGNOSIS — C8119 Nodular sclerosis classical Hodgkin lymphoma, extranodal and solid organ sites: Secondary | ICD-10-CM

## 2013-02-12 DIAGNOSIS — C8195 Hodgkin lymphoma, unspecified, lymph nodes of inguinal region and lower limb: Secondary | ICD-10-CM

## 2013-02-12 DIAGNOSIS — Z5111 Encounter for antineoplastic chemotherapy: Secondary | ICD-10-CM

## 2013-02-12 MED ORDER — DEXAMETHASONE SODIUM PHOSPHATE 4 MG/ML IJ SOLN
20.0000 mg | Freq: Once | INTRAMUSCULAR | Status: AC
  Administered 2013-02-12: 20 mg via INTRAVENOUS

## 2013-02-12 MED ORDER — SODIUM CHLORIDE 0.9 % IV SOLN
580.0000 mg | Freq: Once | INTRAVENOUS | Status: AC
  Administered 2013-02-12: 580 mg via INTRAVENOUS
  Filled 2013-02-12: qty 58

## 2013-02-12 MED ORDER — ONDANSETRON 16 MG/50ML IVPB (CHCC)
16.0000 mg | Freq: Once | INTRAVENOUS | Status: AC
  Administered 2013-02-12: 16 mg via INTRAVENOUS

## 2013-02-12 MED ORDER — SODIUM CHLORIDE 0.9 % IV SOLN
400.0000 mg/m2 | Freq: Once | INTRAVENOUS | Status: AC
  Administered 2013-02-12: 800 mg via INTRAVENOUS
  Filled 2013-02-12: qty 8

## 2013-02-12 MED ORDER — SODIUM CHLORIDE 0.9 % IV SOLN
Freq: Once | INTRAVENOUS | Status: AC
  Administered 2013-02-12: 12:00:00 via INTRAVENOUS
  Filled 2013-02-12: qty 60

## 2013-02-12 MED ORDER — SODIUM CHLORIDE 0.9 % IV SOLN
Freq: Once | INTRAVENOUS | Status: AC
  Administered 2013-02-12: 09:00:00 via INTRAVENOUS

## 2013-02-12 MED ORDER — SODIUM CHLORIDE 0.9 % IV SOLN
100.0000 mg/m2 | Freq: Once | INTRAVENOUS | Status: AC
  Administered 2013-02-12: 200 mg via INTRAVENOUS
  Filled 2013-02-12: qty 10

## 2013-02-12 NOTE — Patient Instructions (Addendum)
[facility] Discharge Instructions for Patients Receiving Chemotherapy  Today you received the following chemotherapy agents Ifex, Etoposide, Carboplatin, and Mesna.  To help prevent nausea and vomiting after your treatment, we encourage you to take your nausea medication as prescribed.    If you develop nausea and vomiting that is not controlled by your nausea medication, call the clinic. If it is after clinic hours your family physician or the after hours number for the clinic or go to the Emergency Department.   BELOW ARE SYMPTOMS THAT SHOULD BE REPORTED IMMEDIATELY:  *FEVER GREATER THAN 100.5 F  *CHILLS WITH OR WITHOUT FEVER  NAUSEA AND VOMITING THAT IS NOT CONTROLLED WITH YOUR NAUSEA MEDICATION  *UNUSUAL SHORTNESS OF BREATH  *UNUSUAL BRUISING OR BLEEDING  TENDERNESS IN MOUTH AND THROAT WITH OR WITHOUT PRESENCE OF ULCERS  *URINARY PROBLEMS  *BOWEL PROBLEMS  UNUSUAL RASH Items with * indicate a potential emergency and should be followed up as soon as possible.   Please let the nurse know about any problems that you may have experienced. Feel free to call the clinic you have any questions or concerns. The clinic phone number is [phone].

## 2013-02-13 ENCOUNTER — Ambulatory Visit (HOSPITAL_BASED_OUTPATIENT_CLINIC_OR_DEPARTMENT_OTHER): Payer: Commercial Managed Care - PPO

## 2013-02-13 VITALS — BP 127/71 | HR 85 | Temp 97.2°F | Resp 17

## 2013-02-13 DIAGNOSIS — C8195 Hodgkin lymphoma, unspecified, lymph nodes of inguinal region and lower limb: Secondary | ICD-10-CM

## 2013-02-13 DIAGNOSIS — Z5111 Encounter for antineoplastic chemotherapy: Secondary | ICD-10-CM

## 2013-02-13 MED ORDER — DEXAMETHASONE SODIUM PHOSPHATE 10 MG/ML IJ SOLN
10.0000 mg | Freq: Once | INTRAMUSCULAR | Status: AC
  Administered 2013-02-13: 10 mg via INTRAVENOUS

## 2013-02-13 MED ORDER — SODIUM CHLORIDE 0.9 % IV SOLN
400.0000 mg/m2 | Freq: Once | INTRAVENOUS | Status: AC
  Administered 2013-02-13: 800 mg via INTRAVENOUS
  Filled 2013-02-13: qty 8

## 2013-02-13 MED ORDER — SODIUM CHLORIDE 0.9 % IV SOLN
100.0000 mg/m2 | Freq: Once | INTRAVENOUS | Status: AC
  Administered 2013-02-13: 200 mg via INTRAVENOUS
  Filled 2013-02-13: qty 10

## 2013-02-13 MED ORDER — ONDANSETRON 8 MG/50ML IVPB (CHCC)
8.0000 mg | Freq: Once | INTRAVENOUS | Status: AC
  Administered 2013-02-13: 8 mg via INTRAVENOUS

## 2013-02-13 MED ORDER — SODIUM CHLORIDE 0.9 % IV SOLN
Freq: Once | INTRAVENOUS | Status: AC
  Administered 2013-02-13: 10:00:00 via INTRAVENOUS

## 2013-02-13 MED ORDER — SODIUM CHLORIDE 0.9 % IV SOLN
Freq: Once | INTRAVENOUS | Status: AC
  Administered 2013-02-13: 12:00:00 via INTRAVENOUS
  Filled 2013-02-13: qty 60

## 2013-02-13 NOTE — Patient Instructions (Signed)
[facility] Discharge Instructions for Patients Receiving Chemotherapy  Today you received the following chemotherapy agents: Mesna, Ifex, VP-16.  To help prevent nausea and vomiting after your treatment, we encourage you to take your nausea medication.  If you develop nausea and vomiting that is not controlled by your nausea medication, call the clinic. If it is after clinic hours your family physician or the after hours number for the clinic or go to the Emergency Department.   BELOW ARE SYMPTOMS THAT SHOULD BE REPORTED IMMEDIATELY:  *FEVER GREATER THAN 100.5 F  *CHILLS WITH OR WITHOUT FEVER  NAUSEA AND VOMITING THAT IS NOT CONTROLLED WITH YOUR NAUSEA MEDICATION  *UNUSUAL SHORTNESS OF BREATH  *UNUSUAL BRUISING OR BLEEDING  TENDERNESS IN MOUTH AND THROAT WITH OR WITHOUT PRESENCE OF ULCERS  *URINARY PROBLEMS  *BOWEL PROBLEMS  UNUSUAL RASH Items with * indicate a potential emergency and should be followed up as soon as possible.  One of the nurses will contact you on Monday. Please let the nurse know about any problems that you may have experienced. Feel free to call the clinic you have any questions or concerns. The clinic phone number is [phone].

## 2013-02-14 ENCOUNTER — Ambulatory Visit (HOSPITAL_BASED_OUTPATIENT_CLINIC_OR_DEPARTMENT_OTHER): Payer: Commercial Managed Care - PPO

## 2013-02-14 ENCOUNTER — Other Ambulatory Visit: Payer: Self-pay | Admitting: Medical Oncology

## 2013-02-14 VITALS — BP 120/77 | HR 75 | Temp 98.5°F | Resp 18

## 2013-02-14 DIAGNOSIS — C8195 Hodgkin lymphoma, unspecified, lymph nodes of inguinal region and lower limb: Secondary | ICD-10-CM

## 2013-02-14 DIAGNOSIS — C8119 Nodular sclerosis classical Hodgkin lymphoma, extranodal and solid organ sites: Secondary | ICD-10-CM

## 2013-02-14 MED ORDER — PEGFILGRASTIM INJECTION 6 MG/0.6ML
6.0000 mg | Freq: Once | SUBCUTANEOUS | Status: AC
  Administered 2013-02-14: 6 mg via SUBCUTANEOUS

## 2013-02-16 ENCOUNTER — Telehealth: Payer: Self-pay | Admitting: *Deleted

## 2013-02-16 ENCOUNTER — Other Ambulatory Visit: Payer: Self-pay | Admitting: Certified Registered Nurse Anesthetist

## 2013-02-16 NOTE — Telephone Encounter (Signed)
PT. HAD SOME NAUSEA AND VOMITING ON Sunday. HIS ANTIEMETICS HELPED. PT. IS EATING AND FORCING FLUIDS. NO MOUTH PROBLEMS. BOWEL MOVEMENTS ARE NORMAL. HE IS AWARE TO CALL THIS OFFICE IF THERE ARE ANY QUESTIONS OR PROBLEMS. WHEN THE OFFICE IS CLOSE PT. WILL CALL AND ASK FOR THE ON CALL PHYSICIAN.

## 2013-02-18 ENCOUNTER — Ambulatory Visit: Payer: Commercial Managed Care - PPO | Admitting: Hematology & Oncology

## 2013-02-18 ENCOUNTER — Other Ambulatory Visit (HOSPITAL_BASED_OUTPATIENT_CLINIC_OR_DEPARTMENT_OTHER): Payer: Commercial Managed Care - PPO | Admitting: Lab

## 2013-02-18 ENCOUNTER — Ambulatory Visit: Payer: Commercial Managed Care - PPO

## 2013-02-18 ENCOUNTER — Other Ambulatory Visit: Payer: Commercial Managed Care - PPO | Admitting: Lab

## 2013-02-18 ENCOUNTER — Ambulatory Visit (HOSPITAL_BASED_OUTPATIENT_CLINIC_OR_DEPARTMENT_OTHER): Payer: Commercial Managed Care - PPO | Admitting: Hematology & Oncology

## 2013-02-18 VITALS — BP 121/71 | HR 89 | Temp 98.4°F | Resp 18 | Ht 70.0 in | Wt 178.0 lb

## 2013-02-18 DIAGNOSIS — C8119 Nodular sclerosis classical Hodgkin lymphoma, extranodal and solid organ sites: Secondary | ICD-10-CM

## 2013-02-18 DIAGNOSIS — C8190 Hodgkin lymphoma, unspecified, unspecified site: Secondary | ICD-10-CM

## 2013-02-18 DIAGNOSIS — C8195 Hodgkin lymphoma, unspecified, lymph nodes of inguinal region and lower limb: Secondary | ICD-10-CM

## 2013-02-18 LAB — CBC WITH DIFFERENTIAL (CANCER CENTER ONLY)
EOS%: 2.5 % (ref 0.0–7.0)
Eosinophils Absolute: 0.2 10*3/uL (ref 0.0–0.5)
MCH: 30.4 pg (ref 28.0–33.4)
MONO%: 6 % (ref 0.0–13.0)
NEUT#: 6.5 10*3/uL (ref 1.5–6.5)
Platelets: 150 10*3/uL (ref 145–400)
RBC: 4.11 10*6/uL — ABNORMAL LOW (ref 4.20–5.70)
WBC: 7.6 10*3/uL (ref 4.0–10.0)

## 2013-02-18 LAB — COMPREHENSIVE METABOLIC PANEL
ALT: 38 U/L (ref 0–53)
AST: 28 U/L (ref 0–37)
Albumin: 4.3 g/dL (ref 3.5–5.2)
Alkaline Phosphatase: 117 U/L (ref 39–117)
Glucose, Bld: 93 mg/dL (ref 70–99)
Potassium: 4.3 mEq/L (ref 3.5–5.3)
Sodium: 140 mEq/L (ref 135–145)
Total Protein: 6.6 g/dL (ref 6.0–8.3)

## 2013-02-18 NOTE — Progress Notes (Signed)
This office note has been dictated.

## 2013-02-19 NOTE — Progress Notes (Signed)
CC:   Brian [name]. Brian [name], M.D. Brian [name], M.D.  DIAGNOSIS:  Resistant Hodgkin disease-nodular sclerosing.  CURRENT THERAPY:  Patient is status post cycle 1 of salvage with ICE.  INTERIM HISTORY:  Brian [name] comes in for followup.  We did find out that he did have resistant Hodgkin disease.  After his 4th cycle of chemo, he did have positive PET scan in the left axilla and mediastinum.  He had a biopsy in the left axilla which, unfortunately, showed Hodgkin lymphoma. The biopsy report is [id number].  He was kindly seen by Dr. Lorin Picket for me.  He did start salvage therapy with ICE.  He tolerated this very well.  I want to do his treatments every 2 weeks so that we can try to get him to transplant.  He did have some nausea and vomiting 1 day.  This was relieved with his antiemetics at home.  He is still trying to exercise.  He is trying to stay as active as he can.  He has had no problems with cough.  He has had no rash.  He has had no leg swelling.  He has had no mouth sores.  PHYSICAL EXAMINATION:  General:  This is a well-developed, well- nourished white gentleman in no obvious distress.  Vital signs: Temperature of 98.4, pulse 89, respiratory rate 18, blood pressure 121/71.  Weight is 178.  [name] and neck:  Normocephalic, atraumatic skull.  There are no ocular or oral lesions.  There are no palpable cervical or supraclavicular lymph nodes.  Lungs:  Clear bilaterally. Cardiac:  Regular rate and rhythm with a normal S1 and S2.  There are no murmurs, rubs, or bruits.  Abdomen:  Soft with good bowel sounds.  There is no palpable abdominal mass.  There is no fluid wave.  There is no palpable hepatosplenomegaly.  Axillary:  Shows the healed left axillary lymphadenopathy biopsy incision.  No obvious lymphadenopathy is noted. Extremities:  No clubbing, cyanosis, or edema.  Skin:  No rash, ecchymosis, or petechia.  LABORATORY STUDIES:  White cell count is 7.6, hemoglobin  12.5, hematocrit 36.9, platelet count 150.  IMPRESSION:  Brian [name] is a 52 year old gentleman with resistant Hodgkin lymphoma.  Again, this is quite surprising.  He really had very good prognostic disease initially.  We will go ahead and start his 2nd cycle of chemo on April 2nd.  I will plan for a followup PET scan a week after his chemotherapy is completed.  Hopefully, we will see that he is in remission.  If not, unfortunately, we may have to consider another biopsy to confirm Hodgkin disease that is resistant/refractory.  I will plan to see him back on April 16th.  We likely will plan for his 3rd cycle of chemo then.  I am anticipating a complete response.  I spent almost an hour with him and his wife today.  They are very, very nice people and I just feel bad that his disease is proving to be such a resilient process.    ______________________________ Brian [name], M.D. PRE/MEDQ  D:  02/18/2013  T:  02/19/2013  Job:  [id number]

## 2013-02-20 ENCOUNTER — Ambulatory Visit (INDEPENDENT_AMBULATORY_CARE_PROVIDER_SITE_OTHER): Payer: Commercial Managed Care - PPO | Admitting: Surgery

## 2013-02-20 ENCOUNTER — Encounter (INDEPENDENT_AMBULATORY_CARE_PROVIDER_SITE_OTHER): Payer: Self-pay | Admitting: Surgery

## 2013-02-20 VITALS — BP 122/78 | HR 72 | Resp 14 | Ht 70.0 in | Wt 179.0 lb

## 2013-02-20 DIAGNOSIS — Z09 Encounter for follow-up examination after completed treatment for conditions other than malignant neoplasm: Secondary | ICD-10-CM

## 2013-02-20 NOTE — Patient Instructions (Signed)
We will see you again on an as needed basis. Please call the office at [phone] if you have any questions or concerns. Thank you for allowing us to take care of you.  

## 2013-02-20 NOTE — Progress Notes (Signed)
NAME: Brian [name]                                            DOB: 1961-07-16 DATE: 02/20/2013                                                  MRN: [id number]  CC:  Chief Complaint  Patient presents with  . Routine Post Op    excision lymph node 02/02/2013    HPI: This patient comes in for post op follow-up .Heunderwent left axillary lymph node excisional biopsy on 02/02/13. He feels that he is doing well.  PE:  VITAL SIGNS: BP 122/78  Pulse 72  Resp 14  Ht 5\' 10"  (1.778 m)  Wt 179 lb (81.194 kg)  BMI 25.68 kg/m2  General: The patient appears to be healthy, NAD Incision: Healing nicely. No evidence of infection or hematoma  DATA REVIEWED: Past report is noted and given to the patient. He was already aware and is starting additional chemotherapy for his Hodgkin's disease  IMPRESSION: The patient is doing well S/P left axillary lymph node biopsy.    PLAN: RTC PRN

## 2013-02-25 ENCOUNTER — Ambulatory Visit: Payer: Commercial Managed Care - PPO

## 2013-02-25 ENCOUNTER — Other Ambulatory Visit: Payer: Self-pay | Admitting: Hematology & Oncology

## 2013-02-25 ENCOUNTER — Ambulatory Visit (HOSPITAL_BASED_OUTPATIENT_CLINIC_OR_DEPARTMENT_OTHER): Payer: Commercial Managed Care - PPO

## 2013-02-25 VITALS — BP 133/80 | HR 76 | Temp 97.9°F | Resp 19

## 2013-02-25 DIAGNOSIS — C8119 Nodular sclerosis classical Hodgkin lymphoma, extranodal and solid organ sites: Secondary | ICD-10-CM

## 2013-02-25 DIAGNOSIS — C8195 Hodgkin lymphoma, unspecified, lymph nodes of inguinal region and lower limb: Secondary | ICD-10-CM

## 2013-02-25 DIAGNOSIS — Z5111 Encounter for antineoplastic chemotherapy: Secondary | ICD-10-CM

## 2013-02-25 MED ORDER — ONDANSETRON 8 MG/50ML IVPB (CHCC)
8.0000 mg | Freq: Once | INTRAVENOUS | Status: AC
  Administered 2013-02-25: 8 mg via INTRAVENOUS

## 2013-02-25 MED ORDER — SODIUM CHLORIDE 0.9 % IV SOLN
Freq: Once | INTRAVENOUS | Status: AC
  Administered 2013-02-25: 12:00:00 via INTRAVENOUS
  Filled 2013-02-25: qty 60

## 2013-02-25 MED ORDER — SODIUM CHLORIDE 0.9 % IV SOLN
Freq: Once | INTRAVENOUS | Status: AC
  Administered 2013-02-25: 10:00:00 via INTRAVENOUS

## 2013-02-25 MED ORDER — SODIUM CHLORIDE 0.9 % IV SOLN
100.0000 mg/m2 | Freq: Once | INTRAVENOUS | Status: AC
  Administered 2013-02-25: 200 mg via INTRAVENOUS
  Filled 2013-02-25: qty 10

## 2013-02-25 MED ORDER — SODIUM CHLORIDE 0.9 % IV SOLN
400.0000 mg/m2 | Freq: Once | INTRAVENOUS | Status: AC
  Administered 2013-02-25: 800 mg via INTRAVENOUS
  Filled 2013-02-25: qty 8

## 2013-02-25 MED ORDER — DEXAMETHASONE SODIUM PHOSPHATE 10 MG/ML IJ SOLN
10.0000 mg | Freq: Once | INTRAMUSCULAR | Status: AC
  Administered 2013-02-25: 10 mg via INTRAVENOUS

## 2013-02-25 NOTE — Patient Instructions (Addendum)
[facility] Discharge Instructions for Patients Receiving Chemotherapy  Today you received the following chemotherapy agents ifosfamide, mesna, etoposide  To help prevent nausea and vomiting after your treatment, we encourage you to take your nausea medication as directed by MD Begin taking it at 4 pm and take it as often as prescribed if needed.   If you develop nausea and vomiting that is not controlled by your nausea medication, call the clinic. If it is after clinic hours your family physician or the after hours number for the clinic or go to the Emergency Department.   BELOW ARE SYMPTOMS THAT SHOULD BE REPORTED IMMEDIATELY:  *FEVER GREATER THAN 100.5 F  *CHILLS WITH OR WITHOUT FEVER  NAUSEA AND VOMITING THAT IS NOT CONTROLLED WITH YOUR NAUSEA MEDICATION  *UNUSUAL SHORTNESS OF BREATH  *UNUSUAL BRUISING OR BLEEDING  TENDERNESS IN MOUTH AND THROAT WITH OR WITHOUT PRESENCE OF ULCERS  *URINARY PROBLEMS  *BOWEL PROBLEMS  UNUSUAL RASH Items with * indicate a potential emergency and should be followed up as soon as possible.  Feel free to call the clinic you have any questions or concerns. The clinic phone number is [phone].   I have been informed and understand all the instructions given to me. I know to contact the clinic, my physician, or go to the Emergency Department if any problems should occur. I do not have any questions at this time, but understand that I may call the clinic during office hours   should I have any questions or need assistance in obtaining follow up care.    __________________________________________  _____________  __________ Signature of Patient or Authorized Representative            Date                   Time    __________________________________________ Nurse's Signature

## 2013-02-26 ENCOUNTER — Ambulatory Visit: Payer: Commercial Managed Care - PPO

## 2013-02-26 ENCOUNTER — Ambulatory Visit (HOSPITAL_BASED_OUTPATIENT_CLINIC_OR_DEPARTMENT_OTHER): Payer: Commercial Managed Care - PPO

## 2013-02-26 VITALS — BP 112/59 | HR 91 | Temp 98.1°F | Resp 16

## 2013-02-26 DIAGNOSIS — Z5111 Encounter for antineoplastic chemotherapy: Secondary | ICD-10-CM

## 2013-02-26 DIAGNOSIS — C8195 Hodgkin lymphoma, unspecified, lymph nodes of inguinal region and lower limb: Secondary | ICD-10-CM

## 2013-02-26 DIAGNOSIS — C8119 Nodular sclerosis classical Hodgkin lymphoma, extranodal and solid organ sites: Secondary | ICD-10-CM

## 2013-02-26 MED ORDER — SODIUM CHLORIDE 0.9 % IV SOLN
100.0000 mg/m2 | Freq: Once | INTRAVENOUS | Status: AC
  Administered 2013-02-26: 200 mg via INTRAVENOUS
  Filled 2013-02-26: qty 10

## 2013-02-26 MED ORDER — SODIUM CHLORIDE 0.9 % IV SOLN
400.0000 mg/m2 | Freq: Once | INTRAVENOUS | Status: AC
  Administered 2013-02-26: 800 mg via INTRAVENOUS
  Filled 2013-02-26: qty 8

## 2013-02-26 MED ORDER — SODIUM CHLORIDE 0.9 % IV SOLN
Freq: Once | INTRAVENOUS | Status: AC
  Administered 2013-02-26: 14:00:00 via INTRAVENOUS

## 2013-02-26 MED ORDER — ONDANSETRON 16 MG/50ML IVPB (CHCC)
16.0000 mg | Freq: Once | INTRAVENOUS | Status: AC
  Administered 2013-02-26: 16 mg via INTRAVENOUS

## 2013-02-26 MED ORDER — SODIUM CHLORIDE 0.9 % IV SOLN
Freq: Once | INTRAVENOUS | Status: AC
  Administered 2013-02-26: 16:00:00 via INTRAVENOUS
  Filled 2013-02-26: qty 60

## 2013-02-26 MED ORDER — DEXAMETHASONE SODIUM PHOSPHATE 4 MG/ML IJ SOLN
20.0000 mg | Freq: Once | INTRAMUSCULAR | Status: AC
  Administered 2013-02-26: 20 mg via INTRAVENOUS

## 2013-02-26 MED ORDER — SODIUM CHLORIDE 0.9 % IV SOLN
580.0000 mg | Freq: Once | INTRAVENOUS | Status: AC
  Administered 2013-02-26: 580 mg via INTRAVENOUS
  Filled 2013-02-26: qty 58

## 2013-02-26 NOTE — Patient Instructions (Addendum)
[facility] Discharge Instructions for Patients Receiving Chemotherapy  Today you received the following chemotherapy agents Mesna, Ifex, etopiside, carboplatin  To help prevent nausea and vomiting after your treatment, we encourage you to take your nausea medication Phenergan 25mg , zofran ODT 8mg , ativan 0.5 mg. Begin taking zofran at 10:15 pm and the others at anytime as ordered by Dr. Clelia Croft and take it as often as prescribed for the next 72 hours.   If you develop nausea and vomiting that is not controlled by your nausea medication, call the clinic. If it is after clinic hours your family physician or the after hours number for the clinic or go to the Emergency Department.   BELOW ARE SYMPTOMS THAT SHOULD BE REPORTED IMMEDIATELY:  *FEVER GREATER THAN 100.5 F  *CHILLS WITH OR WITHOUT FEVER  NAUSEA AND VOMITING THAT IS NOT CONTROLLED WITH YOUR NAUSEA MEDICATION  *UNUSUAL SHORTNESS OF BREATH  *UNUSUAL BRUISING OR BLEEDING  TENDERNESS IN MOUTH AND THROAT WITH OR WITHOUT PRESENCE OF ULCERS  *URINARY PROBLEMS  *BOWEL PROBLEMS  UNUSUAL RASH Items with * indicate a potential emergency and should be followed up as soon as possible.  Please call to let a nurse know about any problems that you may have experienced. Feel free to call the clinic you have any questions or concerns. The clinic phone number is [phone].   I have been informed and understand all the instructions given to me. I know to contact the clinic, my physician, or go to the Emergency Department if any problems should occur. I do not have any questions at this time, but understand that I may call the clinic during office hours   should I have any questions or need assistance in obtaining follow up care.    __________________________________________  _____________  __________ Signature of Patient or Authorized Representative            Date                    Time    __________________________________________ Nurse's Signature

## 2013-02-27 ENCOUNTER — Ambulatory Visit: Payer: Commercial Managed Care - PPO

## 2013-02-27 ENCOUNTER — Ambulatory Visit (HOSPITAL_BASED_OUTPATIENT_CLINIC_OR_DEPARTMENT_OTHER): Payer: Commercial Managed Care - PPO

## 2013-02-27 VITALS — BP 120/60 | HR 85 | Temp 98.5°F | Resp 16

## 2013-02-27 DIAGNOSIS — C8195 Hodgkin lymphoma, unspecified, lymph nodes of inguinal region and lower limb: Secondary | ICD-10-CM

## 2013-02-27 DIAGNOSIS — Z5111 Encounter for antineoplastic chemotherapy: Secondary | ICD-10-CM

## 2013-02-27 DIAGNOSIS — C8119 Nodular sclerosis classical Hodgkin lymphoma, extranodal and solid organ sites: Secondary | ICD-10-CM

## 2013-02-27 MED ORDER — SODIUM CHLORIDE 0.9 % IV SOLN
400.0000 mg/m2 | Freq: Once | INTRAVENOUS | Status: AC
  Administered 2013-02-27: 800 mg via INTRAVENOUS
  Filled 2013-02-27: qty 8

## 2013-02-27 MED ORDER — DEXAMETHASONE SODIUM PHOSPHATE 10 MG/ML IJ SOLN
10.0000 mg | Freq: Once | INTRAMUSCULAR | Status: AC
  Administered 2013-02-27: 10 mg via INTRAVENOUS

## 2013-02-27 MED ORDER — SODIUM CHLORIDE 0.9 % IV SOLN
Freq: Once | INTRAVENOUS | Status: AC
  Administered 2013-02-27: 13:00:00 via INTRAVENOUS

## 2013-02-27 MED ORDER — ONDANSETRON 8 MG/50ML IVPB (CHCC)
8.0000 mg | Freq: Once | INTRAVENOUS | Status: AC
  Administered 2013-02-27: 8 mg via INTRAVENOUS

## 2013-02-27 MED ORDER — SODIUM CHLORIDE 0.9 % IV SOLN
Freq: Once | INTRAVENOUS | Status: AC
  Administered 2013-02-27: 15:00:00 via INTRAVENOUS
  Filled 2013-02-27: qty 60

## 2013-02-27 MED ORDER — SODIUM CHLORIDE 0.9 % IV SOLN
100.0000 mg/m2 | Freq: Once | INTRAVENOUS | Status: AC
  Administered 2013-02-27: 200 mg via INTRAVENOUS
  Filled 2013-02-27: qty 10

## 2013-02-27 NOTE — Patient Instructions (Signed)
[facility] Discharge Instructions for Patients Receiving Chemotherapy  Today you received the following chemotherapy agents Mesna, Ifex, etopiside.  To help prevent nausea and vomiting after your treatment, we encourage you to take your nausea medication Phenergan 25mg , zofran 8 mg, lorazepam 0.5 mg Begin taking it at anytime upon discharge and take it as often as prescribed by Dr. Truett Perna for the next 72 hours.   If you develop nausea and vomiting that is not controlled by your nausea medication, call the clinic. If it is after clinic hours your family physician or the after hours number for the clinic or go to the Emergency Department.   BELOW ARE SYMPTOMS THAT SHOULD BE REPORTED IMMEDIATELY:  *FEVER GREATER THAN 100.5 F  *CHILLS WITH OR WITHOUT FEVER  NAUSEA AND VOMITING THAT IS NOT CONTROLLED WITH YOUR NAUSEA MEDICATION  *UNUSUAL SHORTNESS OF BREATH  *UNUSUAL BRUISING OR BLEEDING  TENDERNESS IN MOUTH AND THROAT WITH OR WITHOUT PRESENCE OF ULCERS  *URINARY PROBLEMS  *BOWEL PROBLEMS  UNUSUAL RASH Items with * indicate a potential emergency and should be followed up as soon as possible.  Please call to let a nurse know about any problems that you may have experienced. Feel free to call the clinic you have any questions or concerns. The clinic phone number is [phone].   I have been informed and understand all the instructions given to me. I know to contact the clinic, my physician, or go to the Emergency Department if any problems should occur. I do not have any questions at this time, but understand that I may call the clinic during office hours   should I have any questions or need assistance in obtaining follow up care.    __________________________________________  _____________  __________ Signature of Patient or Authorized Representative            Date                   Time    __________________________________________ Nurse's  Signature

## 2013-02-28 ENCOUNTER — Ambulatory Visit: Payer: Commercial Managed Care - PPO

## 2013-02-28 ENCOUNTER — Ambulatory Visit (HOSPITAL_BASED_OUTPATIENT_CLINIC_OR_DEPARTMENT_OTHER): Payer: Commercial Managed Care - PPO

## 2013-02-28 VITALS — BP 130/68 | HR 83 | Temp 97.3°F | Resp 16

## 2013-02-28 DIAGNOSIS — C8195 Hodgkin lymphoma, unspecified, lymph nodes of inguinal region and lower limb: Secondary | ICD-10-CM

## 2013-02-28 DIAGNOSIS — C8119 Nodular sclerosis classical Hodgkin lymphoma, extranodal and solid organ sites: Secondary | ICD-10-CM

## 2013-02-28 DIAGNOSIS — Z5189 Encounter for other specified aftercare: Secondary | ICD-10-CM

## 2013-02-28 MED ORDER — PEGFILGRASTIM INJECTION 6 MG/0.6ML
6.0000 mg | Freq: Once | SUBCUTANEOUS | Status: AC
  Administered 2013-02-28: 6 mg via SUBCUTANEOUS

## 2013-03-05 ENCOUNTER — Encounter (HOSPITAL_COMMUNITY)
Admission: RE | Admit: 2013-03-05 | Discharge: 2013-03-05 | Disposition: A | Discharge status: Home / Self Care | Payer: Commercial Managed Care - PPO | Source: Ambulatory Visit | Attending: Hematology & Oncology | Admitting: Hematology & Oncology

## 2013-03-05 DIAGNOSIS — C8195 Hodgkin lymphoma, unspecified, lymph nodes of inguinal region and lower limb: Secondary | ICD-10-CM

## 2013-03-05 MED ORDER — FLUDEOXYGLUCOSE F - 18 (FDG) INJECTION
18.4000 | Freq: Once | INTRAVENOUS | Status: AC | PRN
  Administered 2013-03-05: 18.4 via INTRAVENOUS

## 2013-03-10 ENCOUNTER — Encounter: Payer: Self-pay | Admitting: Hematology & Oncology

## 2013-03-10 NOTE — Progress Notes (Signed)
Patient called in to verify locations of appts, for Wed, Thurs and Friday. I verified his DOB

## 2013-03-11 ENCOUNTER — Ambulatory Visit (HOSPITAL_BASED_OUTPATIENT_CLINIC_OR_DEPARTMENT_OTHER): Payer: Commercial Managed Care - PPO

## 2013-03-11 ENCOUNTER — Ambulatory Visit (HOSPITAL_BASED_OUTPATIENT_CLINIC_OR_DEPARTMENT_OTHER): Payer: Commercial Managed Care - PPO | Admitting: Hematology & Oncology

## 2013-03-11 ENCOUNTER — Other Ambulatory Visit (HOSPITAL_BASED_OUTPATIENT_CLINIC_OR_DEPARTMENT_OTHER): Payer: Commercial Managed Care - PPO | Admitting: Lab

## 2013-03-11 VITALS — BP 118/70 | HR 84 | Temp 98.8°F | Resp 18 | Ht 70.0 in | Wt 179.0 lb

## 2013-03-11 DIAGNOSIS — C8119 Nodular sclerosis classical Hodgkin lymphoma, extranodal and solid organ sites: Secondary | ICD-10-CM

## 2013-03-11 DIAGNOSIS — C8110 Nodular sclerosis classical Hodgkin lymphoma, unspecified site: Secondary | ICD-10-CM

## 2013-03-11 DIAGNOSIS — C8195 Hodgkin lymphoma, unspecified, lymph nodes of inguinal region and lower limb: Secondary | ICD-10-CM

## 2013-03-11 DIAGNOSIS — Z5111 Encounter for antineoplastic chemotherapy: Secondary | ICD-10-CM

## 2013-03-11 DIAGNOSIS — C8190 Hodgkin lymphoma, unspecified, unspecified site: Secondary | ICD-10-CM

## 2013-03-11 LAB — CMP (CANCER CENTER ONLY)
Alkaline Phosphatase: 102 U/L — ABNORMAL HIGH (ref 26–84)
BUN, Bld: 14 mg/dL (ref 7–22)
Creat: 0.7 mg/dl (ref 0.6–1.2)
Glucose, Bld: 110 mg/dL (ref 73–118)
Sodium: 141 mEq/L (ref 128–145)
Total Bilirubin: 0.5 mg/dl (ref 0.20–1.60)

## 2013-03-11 LAB — CBC WITH DIFFERENTIAL (CANCER CENTER ONLY)
BASO#: 0 10*3/uL (ref 0.0–0.2)
Eosinophils Absolute: 0 10*3/uL (ref 0.0–0.5)
LYMPH%: 6.8 % — ABNORMAL LOW (ref 14.0–48.0)
MCH: 30.6 pg (ref 28.0–33.4)
MCV: 92 fL (ref 82–98)
MONO%: 10.1 % (ref 0.0–13.0)
NEUT%: 82.9 % — ABNORMAL HIGH (ref 40.0–80.0)
Platelets: 148 10*3/uL (ref 145–400)
RBC: 4.05 10*6/uL — ABNORMAL LOW (ref 4.20–5.70)

## 2013-03-11 LAB — TECHNOLOGIST REVIEW CHCC SATELLITE: Tech Review: 1

## 2013-03-11 MED ORDER — SODIUM CHLORIDE 0.9 % IV SOLN
400.0000 mg/m2 | Freq: Once | INTRAVENOUS | Status: AC
  Administered 2013-03-11: 800 mg via INTRAVENOUS
  Filled 2013-03-11: qty 8

## 2013-03-11 MED ORDER — SODIUM CHLORIDE 0.9 % IV SOLN
Freq: Once | INTRAVENOUS | Status: AC
  Administered 2013-03-11: 10:00:00 via INTRAVENOUS

## 2013-03-11 MED ORDER — SODIUM CHLORIDE 0.9 % IV SOLN
Freq: Once | INTRAVENOUS | Status: AC
  Administered 2013-03-11: 11:00:00 via INTRAVENOUS
  Filled 2013-03-11: qty 60

## 2013-03-11 MED ORDER — SODIUM CHLORIDE 0.9 % IV SOLN
100.0000 mg/m2 | Freq: Once | INTRAVENOUS | Status: AC
  Administered 2013-03-11: 200 mg via INTRAVENOUS
  Filled 2013-03-11: qty 10

## 2013-03-11 MED ORDER — ONDANSETRON 8 MG/50ML IVPB (CHCC)
8.0000 mg | Freq: Once | INTRAVENOUS | Status: AC
  Administered 2013-03-11: 8 mg via INTRAVENOUS

## 2013-03-11 MED ORDER — DEXAMETHASONE SODIUM PHOSPHATE 10 MG/ML IJ SOLN
10.0000 mg | Freq: Once | INTRAMUSCULAR | Status: AC
  Administered 2013-03-11: 10 mg via INTRAVENOUS

## 2013-03-11 NOTE — Addendum Note (Signed)
Addended by: Arlan Organ R on: 03/11/2013 06:06 PM   Modules accepted: Orders

## 2013-03-11 NOTE — Progress Notes (Signed)
This office note has been dictated.

## 2013-03-11 NOTE — Patient Instructions (Addendum)
[facility] Discharge Instructions for Patients Receiving Chemotherapy  Today you received the following chemotherapy agents Ifosphamide, Mesna,   To help prevent nausea and vomiting after your treatment, we encourage you to take your nausea medication as prescribed.  If you develop nausea and vomiting that is not controlled by your nausea medication, call the clinic. If it is after clinic hours your family physician or the after hours number for the clinic or go to the Emergency Department.   BELOW ARE SYMPTOMS THAT SHOULD BE REPORTED IMMEDIATELY:  *FEVER GREATER THAN 100.5 F  *CHILLS WITH OR WITHOUT FEVER  NAUSEA AND VOMITING THAT IS NOT CONTROLLED WITH YOUR NAUSEA MEDICATION  *UNUSUAL SHORTNESS OF BREATH  *UNUSUAL BRUISING OR BLEEDING  TENDERNESS IN MOUTH AND THROAT WITH OR WITHOUT PRESENCE OF ULCERS  *URINARY PROBLEMS  *BOWEL PROBLEMS  UNUSUAL RASH Items with * indicate a potential emergency and should be followed up as soon as possible.  One of the nurses will contact you 24 hours after your treatment. Please let the nurse know about any problems that you may have experienced. Feel free to call the clinic you have any questions or concerns. The clinic phone number is [phone].   I have been informed and understand all the instructions given to me. I know to contact the clinic, my physician, or go to the Emergency Department if any problems should occur. I do not have any questions at this time, but understand that I may call the clinic during office hours   should I have any questions or need assistance in obtaining follow up care.    __________________________________________  _____________  __________ Signature of Patient or Authorized Representative            Date                   Time    __________________________________________ Nurse's Signature

## 2013-03-12 ENCOUNTER — Telehealth: Payer: Self-pay | Admitting: Hematology & Oncology

## 2013-03-12 ENCOUNTER — Ambulatory Visit (HOSPITAL_BASED_OUTPATIENT_CLINIC_OR_DEPARTMENT_OTHER): Payer: Commercial Managed Care - PPO

## 2013-03-12 VITALS — BP 110/61 | HR 79 | Temp 97.2°F | Resp 18

## 2013-03-12 DIAGNOSIS — C8195 Hodgkin lymphoma, unspecified, lymph nodes of inguinal region and lower limb: Secondary | ICD-10-CM

## 2013-03-12 DIAGNOSIS — Z5111 Encounter for antineoplastic chemotherapy: Secondary | ICD-10-CM

## 2013-03-12 DIAGNOSIS — C8119 Nodular sclerosis classical Hodgkin lymphoma, extranodal and solid organ sites: Secondary | ICD-10-CM

## 2013-03-12 MED ORDER — SODIUM CHLORIDE 0.9 % IV SOLN
400.0000 mg/m2 | Freq: Once | INTRAVENOUS | Status: AC
  Administered 2013-03-12: 800 mg via INTRAVENOUS
  Filled 2013-03-12: qty 8

## 2013-03-12 MED ORDER — SODIUM CHLORIDE 0.9 % IV SOLN
Freq: Once | INTRAVENOUS | Status: AC
  Administered 2013-03-12: 15:00:00 via INTRAVENOUS
  Filled 2013-03-12: qty 60

## 2013-03-12 MED ORDER — SODIUM CHLORIDE 0.9 % IV SOLN
Freq: Once | INTRAVENOUS | Status: AC
  Administered 2013-03-12: 13:00:00 via INTRAVENOUS

## 2013-03-12 MED ORDER — SODIUM CHLORIDE 0.9 % IV SOLN
580.0000 mg | Freq: Once | INTRAVENOUS | Status: AC
  Administered 2013-03-12: 580 mg via INTRAVENOUS
  Filled 2013-03-12: qty 58

## 2013-03-12 MED ORDER — DEXAMETHASONE SODIUM PHOSPHATE 4 MG/ML IJ SOLN
20.0000 mg | Freq: Once | INTRAMUSCULAR | Status: AC
  Administered 2013-03-12: 20 mg via INTRAVENOUS

## 2013-03-12 MED ORDER — SODIUM CHLORIDE 0.9 % IV SOLN
100.0000 mg/m2 | Freq: Once | INTRAVENOUS | Status: AC
  Administered 2013-03-12: 200 mg via INTRAVENOUS
  Filled 2013-03-12: qty 10

## 2013-03-12 MED ORDER — ONDANSETRON 16 MG/50ML IVPB (CHCC)
16.0000 mg | Freq: Once | INTRAVENOUS | Status: AC
  Administered 2013-03-12: 16 mg via INTRAVENOUS

## 2013-03-12 NOTE — Telephone Encounter (Signed)
Pt aware of 4-23 Echo and PFT

## 2013-03-12 NOTE — Patient Instructions (Addendum)
Etoposide, VP-16 injection What is this medicine? ETOPOSIDE, VP-16 (e toe POE side) is a chemotherapy drug. It is used to treat testicular cancer, lung cancer, and other cancers. This medicine may be used for other purposes; ask your health care provider or pharmacist if you have questions. What should I tell my health care provider before I take this medicine? They need to know if you have any of these conditions: -infection -kidney disease -low blood counts, like low white cell, platelet, or red cell counts -an unusual or allergic reaction to etoposide, other chemotherapeutic agents, other medicines, foods, dyes, or preservatives -pregnant or trying to get pregnant -breast-feeding How should I use this medicine? This medicine is for infusion into a vein. It is administered in a hospital or clinic by a specially trained health care professional. Talk to your pediatrician regarding the use of this medicine in children. Special care may be needed. Overdosage: If you think you have taken too much of this medicine contact a poison control center or emergency room at once. NOTE: This medicine is only for you. Do not share this medicine with others. What if I miss a dose? It is important not to miss your dose. Call your doctor or health care professional if you are unable to keep an appointment. What may interact with this medicine? -cyclosporine -medicines to increase blood counts like filgrastim, pegfilgrastim, sargramostim -vaccines This list may not describe all possible interactions. Give your health care provider a list of all the medicines, herbs, non-prescription drugs, or dietary supplements you use. Also tell them if you smoke, drink alcohol, or use illegal drugs. Some items may interact with your medicine. What should I watch for while using this medicine? Visit your doctor for checks on your progress. This drug may make you feel generally unwell. This is not uncommon, as chemotherapy  can affect healthy cells as well as cancer cells. Report any side effects. Continue your course of treatment even though you feel ill unless your doctor tells you to stop. In some cases, you may be given additional medicines to help with side effects. Follow all directions for their use. Call your doctor or health care professional for advice if you get a fever, chills or sore throat, or other symptoms of a cold or flu. Do not treat yourself. This drug decreases your body's ability to fight infections. Try to avoid being around people who are sick. This medicine may increase your risk to bruise or bleed. Call your doctor or health care professional if you notice any unusual bleeding. Be careful brushing and flossing your teeth or using a toothpick because you may get an infection or bleed more easily. If you have any dental work done, tell your dentist you are receiving this medicine. Avoid taking products that contain aspirin, acetaminophen, ibuprofen, naproxen, or ketoprofen unless instructed by your doctor. These medicines may hide a fever. Do not become pregnant while taking this medicine. Women should inform their doctor if they wish to become pregnant or think they might be pregnant. There is a potential for serious side effects to an unborn child. Talk to your health care professional or pharmacist for more information. Do not breast-feed an infant while taking this medicine. What side effects may I notice from receiving this medicine? Side effects that you should report to your doctor or health care professional as soon as possible: -allergic reactions like skin rash, itching or hives, swelling of the face, lips, or tongue -low blood counts - this medicine  may decrease the number of white blood cells, red blood cells and platelets. You may be at increased risk for infections and bleeding. -signs of infection - fever or chills, cough, sore throat, pain or difficulty passing urine -signs of  decreased platelets or bleeding - bruising, pinpoint red spots on the skin, black, tarry stools, blood in the urine -signs of decreased red blood cells - unusually weak or tired, fainting spells, lightheadedness -breathing problems -changes in vision -mouth or throat sores or ulcers -pain, redness, swelling or irritation at the injection site -pain, tingling, numbness in the hands or feet -redness, blistering, peeling or loosening of the skin, including inside the mouth -seizures -vomiting Side effects that usually do not require medical attention (report to your doctor or health care professional if they continue or are bothersome): -diarrhea -hair loss -loss of appetite -nausea -stomach pain This list may not describe all possible side effects. Call your doctor for medical advice about side effects. You may report side effects to FDA at 1-800-FDA-1088. Where should I keep my medicine? This drug is given in a hospital or clinic and will not be stored at home. NOTE: This sheet is a summary. It may not cover all possible information. If you have questions about this medicine, talk to your doctor, pharmacist, or health care provider.  2012, [employer]. (03/15/2008 5:24:12 PM)Carboplatin injection What is this medicine? CARBOPLATIN (KAR boe pla tin) is a chemotherapy drug. It targets fast dividing cells, like cancer cells, and causes these cells to die. This medicine is used to treat ovarian cancer and many other cancers. This medicine may be used for other purposes; ask your health care provider or pharmacist if you have questions. What should I tell my health care provider before I take this medicine? They need to know if you have any of these conditions: -blood disorders -hearing problems -kidney disease -recent or ongoing radiation therapy -an unusual or allergic reaction to carboplatin, cisplatin, other chemotherapy, other medicines, foods, dyes, or  preservatives -pregnant or trying to get pregnant -breast-feeding How should I use this medicine? This drug is usually given as an infusion into a vein. It is administered in a hospital or clinic by a specially trained health care professional. Talk to your pediatrician regarding the use of this medicine in children. Special care may be needed. Overdosage: If you think you have taken too much of this medicine contact a poison control center or emergency room at once. NOTE: This medicine is only for you. Do not share this medicine with others. What if I miss a dose? It is important not to miss a dose. Call your doctor or health care professional if you are unable to keep an appointment. What may interact with this medicine? -medicines for seizures -medicines to increase blood counts like filgrastim, pegfilgrastim, sargramostim -some antibiotics like amikacin, gentamicin, neomycin, streptomycin, tobramycin -vaccines Talk to your doctor or health care professional before taking any of these medicines: -acetaminophen -aspirin -ibuprofen -ketoprofen -naproxen This list may not describe all possible interactions. Give your health care provider a list of all the medicines, herbs, non-prescription drugs, or dietary supplements you use. Also tell them if you smoke, drink alcohol, or use illegal drugs. Some items may interact with your medicine. What should I watch for while using this medicine? Your condition will be monitored carefully while you are receiving this medicine. You will need important blood work done while you are taking this medicine. This drug may make you feel generally unwell. This  is not uncommon, as chemotherapy can affect healthy cells as well as cancer cells. Report any side effects. Continue your course of treatment even though you feel ill unless your doctor tells you to stop. In some cases, you may be given additional medicines to help with side effects. Follow all directions  for their use. Call your doctor or health care professional for advice if you get a fever, chills or sore throat, or other symptoms of a cold or flu. Do not treat yourself. This drug decreases your body's ability to fight infections. Try to avoid being around people who are sick. This medicine may increase your risk to bruise or bleed. Call your doctor or health care professional if you notice any unusual bleeding. Be careful brushing and flossing your teeth or using a toothpick because you may get an infection or bleed more easily. If you have any dental work done, tell your dentist you are receiving this medicine. Avoid taking products that contain aspirin, acetaminophen, ibuprofen, naproxen, or ketoprofen unless instructed by your doctor. These medicines may hide a fever. Do not become pregnant while taking this medicine. Women should inform their doctor if they wish to become pregnant or think they might be pregnant. There is a potential for serious side effects to an unborn child. Talk to your health care professional or pharmacist for more information. Do not breast-feed an infant while taking this medicine. What side effects may I notice from receiving this medicine? Side effects that you should report to your doctor or health care professional as soon as possible: -allergic reactions like skin rash, itching or hives, swelling of the face, lips, or tongue -signs of infection - fever or chills, cough, sore throat, pain or difficulty passing urine -signs of decreased platelets or bleeding - bruising, pinpoint red spots on the skin, black, tarry stools, nosebleeds -signs of decreased red blood cells - unusually weak or tired, fainting spells, lightheadedness -breathing problems -changes in hearing -changes in vision -chest pain -high blood pressure -low blood counts - This drug may decrease the number of white blood cells, red blood cells and platelets. You may be at increased risk for  infections and bleeding. -nausea and vomiting -pain, swelling, redness or irritation at the injection site -pain, tingling, numbness in the hands or feet -problems with balance, talking, walking -trouble passing urine or change in the amount of urine Side effects that usually do not require medical attention (report to your doctor or health care professional if they continue or are bothersome): -hair loss -loss of appetite -metallic taste in the mouth or changes in taste This list may not describe all possible side effects. Call your doctor for medical advice about side effects. You may report side effects to FDA at 1-800-FDA-1088. Where should I keep my medicine? This drug is given in a hospital or clinic and will not be stored at home. NOTE: This sheet is a summary. It may not cover all possible information. If you have questions about this medicine, talk to your doctor, pharmacist, or health care provider.  2012, [employer] Standard. (02/17/2008 2:38:05 PM)Mesna injection What is this medicine? MESNA (MES na) is used to prevent bleeding from the bladder during treatment with ifosfamide. This medicine does not reduce the chance of getting other side effects of cancer chemotherapy. This medicine may be used for other purposes; ask your health care provider or pharmacist if you have questions. What should I tell my health care provider before I take this medicine? They need  to know if you have any of these conditions: -autoimmune disease like lupus, nephritis, or rheumatoid arthritis -an unusual or allergic reaction to mesna, benzyl alcohol, sulfur medicines, other medicines, foods, dyes, or preservatives -pregnant or trying to get pregnant -breast-feeding How should I use this medicine? This medicine is for infusion into a vein. It is given by a health care professional in a hospital or clinic setting. Talk to your pediatrician regarding the use of this medicine in children. Special  care may be needed. Overdosage: If you think you have taken too much of this medicine contact a poison control center or emergency room at once. NOTE: This medicine is only for you. Do not share this medicine with others. What if I miss a dose? This does not apply. What may interact with this medicine? Interactions are not expected. This list may not describe all possible interactions. Give your health care provider a list of all the medicines, herbs, non-prescription drugs, or dietary supplements you use. Also tell them if you smoke, drink alcohol, or use illegal drugs. Some items may interact with your medicine. What should I watch for while using this medicine? Your doctor will follow your condition closely while you are taking this medicine. Tell your doctor right away if you see that your urine has turned a pink or red color. It is important to drink at least a quart (4 cups) of fluids each day that you take this medicine. What side effects may I notice from receiving this medicine? Side effects that you should report to your doctor or health care professional as soon as possible: -allergic reactions like skin rash, itching or hives, swelling of the face, lips, or tongue -breathing problems -blood in your urine or pink to red colored urine -fever, chills, or sore throat -flushing or redness to skin -mouth sores -pain or redness at site where injected -swelling of ankles or feet -vomiting Side effects that usually do not require medical attention (report to your doctor or health care professional if they continue or are bothersome): -aches and pains -bad taste in mouth -diarrhea -dizziness -hair loss -headache -nausea This list may not describe all possible side effects. Call your doctor for medical advice about side effects. You may report side effects to FDA at 1-800-FDA-1088. Where should I keep my medicine? This drug is given in a hospital or clinic and will not be stored at  home. NOTE: This sheet is a summary. It may not cover all possible information. If you have questions about this medicine, talk to your doctor, pharmacist, or health care provider.  2012, [employer] Standard. (05/31/2008 4:43:56 PM)Ifosfamide injection What is this medicine? IFOSFAMIDE (eye FOS fa mide) is a chemotherapy drug. It slows the growth of cancer cells. This medicine is used to treat testicular cancer. This medicine may be used for other purposes; ask your health care provider or pharmacist if you have questions. What should I tell my health care provider before I take this medicine? They need to know if you have any of these conditions: -bladder problems -blood disorders -dehydration -infection (especially a virus infection such as chickenpox, cold sores, or herpes) -kidney disease -liver disease -recent or ongoing radiation therapy -an unusual or allergic reaction to ifosfamide, other chemotherapy, other medicines, foods, dyes, or preservatives -pregnant or trying to get pregnant -breast-feeding How should I use this medicine? This drug is given as an infusion into a vein. It is administered in a hospital or clinic by a specially trained health care  professional. Talk to your pediatrician regarding the use of this medicine in children. Special care may be needed. Overdosage: If you think you have taken too much of this medicine contact a poison control center or emergency room at once. NOTE: This medicine is only for you. Do not share this medicine with others. What if I miss a dose? It is important not to miss your dose. Call your doctor or health care professional if you are unable to keep an appointment. What may interact with this medicine? Do not take this medicine with any of the following medications: -nalidixic acid This medicine may also interact with the following medications: -medicines to increase blood counts like filgrastim, pegfilgrastim, sargramostim -St.  John's Wort -vaccines Talk to your doctor or health care professional before taking any of these medicines: -acetaminophen -aspirin -ibuprofen -ketoprofen -naproxen This list may not describe all possible interactions. Give your health care provider a list of all the medicines, herbs, non-prescription drugs, or dietary supplements you use. Also tell them if you smoke, drink alcohol, or use illegal drugs. Some items may interact with your medicine. What should I watch for while using this medicine? Visit your doctor for checks on your progress. This drug may make you feel generally unwell. This is not uncommon, as chemotherapy can affect healthy cells as well as cancer cells. Report any side effects. Continue your course of treatment even though you feel ill unless your doctor tells you to stop. Drink water or other fluids as directed. Urinate often, even at night. In some cases, you may be given additional medicines to help with side effects. Follow all directions for their use. Call your doctor or health care professional for advice if you get a fever, chills or sore throat, or other symptoms of a cold or flu. Do not treat yourself. This drug decreases your body's ability to fight infections. Try to avoid being around people who are sick. This medicine may increase your risk to bruise or bleed. Call your doctor or health care professional if you notice any unusual bleeding. Be careful brushing and flossing your teeth or using a toothpick because you may get an infection or bleed more easily. If you have any dental work done, tell your dentist you are receiving this medicine. Avoid taking products that contain aspirin, acetaminophen, ibuprofen, naproxen, or ketoprofen unless instructed by your doctor. These medicines may hide a fever. Do not become pregnant while taking this medicine. Women should inform their doctor if they wish to become pregnant or think they might be pregnant. There is a  potential for serious side effects to an unborn child. Talk to your health care professional or pharmacist for more information. Do not breast-feed an infant while taking this medicine. What side effects may I notice from receiving this medicine? Side effects that you should report to your doctor or health care professional as soon as possible: -allergic reactions like skin rash, itching or hives, swelling of the face, lips, or tongue -low blood counts - this medicine may decrease the number of white blood cells, red blood cells and platelets. You may be at increased risk for infections and bleeding. -signs of infection - fever or chills, cough, sore throat, pain or difficulty passing urine -signs of decreased platelets or bleeding - bruising, pinpoint red spots on the skin, black, tarry stools, blood in the urine -signs of decreased red blood cells - unusually weak or tired, fainting spells, lightheadedness -agitation -breathing problems -confusion -dark urine -hallucinations -mouth sores -  pain, swelling, redness at site where injected -seizures -trouble passing urine or change in the amount of urine -yellowing of the eyes or skin Side effects that usually do not require medical attention (report to your doctor or health care professional if they continue or are bothersome): -diarrhea -hair loss -loss of appetite -nausea, vomiting This list may not describe all possible side effects. Call your doctor for medical advice about side effects. You may report side effects to FDA at 1-800-FDA-1088. Where should I keep my medicine? This drug is given in a hospital or clinic and will not be stored at home. NOTE: This sheet is a summary. It may not cover all possible information. If you have questions about this medicine, talk to your doctor, pharmacist, or health care provider.  2013, [employer]. (03/30/2008 4:23:05 PM)

## 2013-03-13 ENCOUNTER — Ambulatory Visit (HOSPITAL_BASED_OUTPATIENT_CLINIC_OR_DEPARTMENT_OTHER): Payer: Commercial Managed Care - PPO

## 2013-03-13 DIAGNOSIS — C8119 Nodular sclerosis classical Hodgkin lymphoma, extranodal and solid organ sites: Secondary | ICD-10-CM

## 2013-03-13 DIAGNOSIS — Z5111 Encounter for antineoplastic chemotherapy: Secondary | ICD-10-CM

## 2013-03-13 DIAGNOSIS — C8195 Hodgkin lymphoma, unspecified, lymph nodes of inguinal region and lower limb: Secondary | ICD-10-CM

## 2013-03-13 MED ORDER — SODIUM CHLORIDE 0.9 % IV SOLN
Freq: Once | INTRAVENOUS | Status: AC
  Administered 2013-03-13: 16:00:00 via INTRAVENOUS
  Filled 2013-03-13: qty 60

## 2013-03-13 MED ORDER — SODIUM CHLORIDE 0.9 % IV SOLN
Freq: Once | INTRAVENOUS | Status: AC
  Administered 2013-03-13: 14:00:00 via INTRAVENOUS

## 2013-03-13 MED ORDER — ONDANSETRON 8 MG/50ML IVPB (CHCC)
8.0000 mg | Freq: Once | INTRAVENOUS | Status: AC
  Administered 2013-03-13: 8 mg via INTRAVENOUS

## 2013-03-13 MED ORDER — SODIUM CHLORIDE 0.9 % IV SOLN
100.0000 mg/m2 | Freq: Once | INTRAVENOUS | Status: AC
  Administered 2013-03-13: 200 mg via INTRAVENOUS
  Filled 2013-03-13: qty 10

## 2013-03-13 MED ORDER — SODIUM CHLORIDE 0.9 % IV SOLN
400.0000 mg/m2 | Freq: Once | INTRAVENOUS | Status: AC
  Administered 2013-03-13: 800 mg via INTRAVENOUS
  Filled 2013-03-13: qty 8

## 2013-03-13 MED ORDER — DEXAMETHASONE SODIUM PHOSPHATE 10 MG/ML IJ SOLN
10.0000 mg | Freq: Once | INTRAMUSCULAR | Status: AC
  Administered 2013-03-13: 10 mg via INTRAVENOUS

## 2013-03-13 NOTE — Progress Notes (Signed)
CC:   [name]. [name], M.D. [name], M.D. Verita Schneiders, MD  DIAGNOSIS:  Refractory resistant nodular sclerosing Hodgkin disease.  CURRENT THERAPY:  Patient is status post 2 cycles of salvage therapy with ICE.  INTERIM HISTORY:  Mr. Holzschuh comes in for followup.  He has done incredibly well with chemotherapy.  He has really had very little toxicity with the ICE regimen.  Thankfully, he has responded beautifully to this.  He had a PET scan done last week which showed a complete response to chemo.  I have called Dr. Jacqulyn Bath at [facility].  Dr. Jacqulyn Bath feels that he would be a very good candidate for stem cell transplantation.  Mr. Stumpe feels well.  He is still working.  He is still exercising.  His performance status is ECOG 0.  He finally lost his hair.  Apparently, he has had a little nausea, but no vomiting.  He has had no problems with bowels or bladder.  He has had no headache.  He has had no rashes.  He has had no cough or shortness of breath.  PHYSICAL EXAMINATION:  General:  This is a well-developed, well- nourished white gentleman in no obvious distress.  Vital signs: Temperature of 98.1, pulse 84, respiratory rate 18, blood pressure 118/70.  Weight is 179.  Head and neck:  Normocephalic, atraumatic skull.  There are no ocular or oral lesions.  There are no palpable cervical or supraclavicular lymph nodes.  Lungs:  Clear bilaterally. Cardiac:  Regular rate and rhythm with a normal S1 and S2.  There are no murmurs, rubs, or bruits.  Abdomen:  Soft with good bowel sounds.  There is no palpable abdominal mass.  There is no fluid wave.  There is no palpable hepatosplenomegaly.  Extremities:  No clubbing, cyanosis, or edema.  Neurological:  No focal neurological deficits.  Skin:  No rashes, ecchymosis, or petechia.  LABORATORY STUDIES:  White cell count is 10.7, hemoglobin 12.4, hematocrit 37.4, platelet count 148.  BUN is 14, creatinine 0.7.  LFTs are normal.  LDH is  242.  IMPRESSION:  Mr. Sedgwick is a very nice 52 year old white gentleman with resistant Hodgkin lymphoma.  He has nodular sclerosing subtype.  After 4 cycles of chemo of ABVD, he still has positive disease.  We biopsied 1 of the areas, which was positive for Hodgkin.  He is responding to salvage therapy.  I really believe that we have to get him to stem cell transplantation.  Hopefully, [facility] will se him after this next cycle of chemotherapy. Hopefully, they will be able to move him along quickly and get his stem cells when his bone marrow recovers from this cycle of chemotherapy.  I will be more than happy to do any tests that need to be done with Mr. Churchwell.  I am sure [facility] will let us know what we need to do.  If we do need to give him a 4th cycle of chemotherapy, we certainly can.  I am just very excited that Mr. Armbrust is in remission from a radiological point of view.    ______________________________ [name], M.D. PRE/MEDQ  D:  03/11/2013  T:  03/12/2013  Job:  [id number]

## 2013-03-14 ENCOUNTER — Ambulatory Visit (HOSPITAL_BASED_OUTPATIENT_CLINIC_OR_DEPARTMENT_OTHER): Payer: Commercial Managed Care - PPO

## 2013-03-14 VITALS — BP 135/77 | HR 89 | Temp 97.7°F | Resp 18

## 2013-03-14 DIAGNOSIS — C8119 Nodular sclerosis classical Hodgkin lymphoma, extranodal and solid organ sites: Secondary | ICD-10-CM

## 2013-03-14 DIAGNOSIS — Z5189 Encounter for other specified aftercare: Secondary | ICD-10-CM

## 2013-03-14 DIAGNOSIS — C8195 Hodgkin lymphoma, unspecified, lymph nodes of inguinal region and lower limb: Secondary | ICD-10-CM

## 2013-03-14 MED ORDER — PEGFILGRASTIM INJECTION 6 MG/0.6ML
6.0000 mg | Freq: Once | SUBCUTANEOUS | Status: AC
  Administered 2013-03-14: 6 mg via SUBCUTANEOUS

## 2013-03-16 ENCOUNTER — Telehealth: Payer: Self-pay

## 2013-03-16 NOTE — Telephone Encounter (Signed)
Patient called he has been dx with Lymphoma states he did initial chemo with good results, then did not respond with next round, so Dr Myna Hidalgo has recommended [facility] for a stem cell procedure. Please advised can we refer.? Patient wants to talk to you about this also.

## 2013-03-16 NOTE — Telephone Encounter (Signed)
Pt asks for dr daub to call him so he can update his treatment plan/status.  Best: [phone]  bf

## 2013-03-17 NOTE — Telephone Encounter (Signed)
Call Brian [name] will call him today with an appointment time to see me Monday or Tuesday next week.

## 2013-03-17 NOTE — Telephone Encounter (Signed)
Please call patient with appt time/ date next week on Monday or Tuesday.

## 2013-03-17 NOTE — Telephone Encounter (Signed)
Appt made for 4/28

## 2013-03-18 ENCOUNTER — Ambulatory Visit (HOSPITAL_COMMUNITY)
Admission: RE | Admit: 2013-03-18 | Discharge: 2013-03-18 | Disposition: A | Discharge status: Home / Self Care | Payer: Commercial Managed Care - PPO | Source: Ambulatory Visit | Attending: Hematology & Oncology | Admitting: Hematology & Oncology

## 2013-03-18 ENCOUNTER — Telehealth: Payer: Self-pay | Admitting: Hematology & Oncology

## 2013-03-18 DIAGNOSIS — C8110 Nodular sclerosis classical Hodgkin lymphoma, unspecified site: Secondary | ICD-10-CM

## 2013-03-18 DIAGNOSIS — I517 Cardiomegaly: Secondary | ICD-10-CM

## 2013-03-18 DIAGNOSIS — C8190 Hodgkin lymphoma, unspecified, unspecified site: Secondary | ICD-10-CM | POA: Insufficient documentation

## 2013-03-18 DIAGNOSIS — Z01810 Encounter for preprocedural cardiovascular examination: Secondary | ICD-10-CM | POA: Insufficient documentation

## 2013-03-18 DIAGNOSIS — Z9221 Personal history of antineoplastic chemotherapy: Secondary | ICD-10-CM | POA: Insufficient documentation

## 2013-03-18 DIAGNOSIS — J45909 Unspecified asthma, uncomplicated: Secondary | ICD-10-CM | POA: Insufficient documentation

## 2013-03-18 HISTORY — PX: TRANSTHORACIC ECHOCARDIOGRAM: SHX275

## 2013-03-18 MED ORDER — ALBUTEROL SULFATE (5 MG/ML) 0.5% IN NEBU
2.5000 mg | INHALATION_SOLUTION | Freq: Once | RESPIRATORY_TRACT | Status: AC
  Administered 2013-03-18: 2.5 mg via RESPIRATORY_TRACT

## 2013-03-18 NOTE — Telephone Encounter (Signed)
Shawna Orleans from [facility] called [phone] and requested most recent MD notes to be faxed to [phone].  03/12/13 notes was faxed over

## 2013-03-18 NOTE — Telephone Encounter (Signed)
Brian [name] [phone] from [facility] called again requesting more records to be sent.  i faxed over more records to [phone]

## 2013-03-19 ENCOUNTER — Telehealth: Payer: Self-pay | Admitting: *Deleted

## 2013-03-19 NOTE — Telephone Encounter (Signed)
Message copied by Anselm Jungling on Thu Mar 19, 2013 10:43 AM ------      Message from: Arlan Organ R      Created: Wed Mar 18, 2013  6:08 PM       Call - heart and lungs are doing great!!!  Good luck with the [facility] appt!! Cindee Lame ------

## 2013-03-19 NOTE — Telephone Encounter (Signed)
Called patient to let him know that his 2 D echo showed his heart and lungs are doing great.

## 2013-03-20 ENCOUNTER — Other Ambulatory Visit: Payer: Self-pay | Admitting: Hematology & Oncology

## 2013-03-20 ENCOUNTER — Telehealth: Payer: Self-pay | Admitting: Hematology & Oncology

## 2013-03-20 ENCOUNTER — Telehealth: Payer: Self-pay | Admitting: *Deleted

## 2013-03-20 DIAGNOSIS — C8195 Hodgkin lymphoma, unspecified, lymph nodes of inguinal region and lower limb: Secondary | ICD-10-CM

## 2013-03-20 NOTE — Telephone Encounter (Signed)
Patient called regarding his appts. He needs the 5/1 and 5/2 appts in the morning. Appts moved. JMW

## 2013-03-20 NOTE — Telephone Encounter (Signed)
Pt aware of week of 4-30 appointments

## 2013-03-23 ENCOUNTER — Telehealth: Payer: Self-pay | Admitting: Hematology & Oncology

## 2013-03-23 ENCOUNTER — Ambulatory Visit (INDEPENDENT_AMBULATORY_CARE_PROVIDER_SITE_OTHER): Payer: Commercial Managed Care - PPO | Admitting: Emergency Medicine

## 2013-03-23 ENCOUNTER — Encounter: Payer: Self-pay | Admitting: Emergency Medicine

## 2013-03-23 VITALS — BP 110/69 | HR 90 | Temp 97.2°F | Resp 18 | Wt 179.0 lb

## 2013-03-23 DIAGNOSIS — C8590 Non-Hodgkin lymphoma, unspecified, unspecified site: Secondary | ICD-10-CM

## 2013-03-23 DIAGNOSIS — C8589 Other specified types of non-Hodgkin lymphoma, extranodal and solid organ sites: Secondary | ICD-10-CM

## 2013-03-23 NOTE — Telephone Encounter (Signed)
Patient called and sch lab apts per [facility] for 5/5, 5/7, and 5/9.

## 2013-03-23 NOTE — Progress Notes (Signed)
  Subjective:    Patient ID: Brian [name], male    DOB: 03-24-1961, 52 y.o.   MRN: [id number]  HPI patient enters to discuss his situation regarding his Hodgkin's disease. He has failed his initial chemotherapy and is currently on a treatment called ice. He is scheduled to be seen at [facility] tomorrow to have a Hickman catheter inserted and to have his cells harvested to undergo a stem cell transplant in the near future. He is doing great. He is exercising has not missed hardly any work family has stayed busy.    Review of Systems     Objective:   Physical Exam HEENT is unremarkable except for hair loss. He has no adenopathy. His abdomen is soft nontender. There are no nodes palpable . he has scars in both inguinal areas and his left axilla from previous lymph node biopsies.        Assessment & Plan:  Patient is doing great on current medications. He is to proceed with a stem cell treatment through [facility].

## 2013-03-25 ENCOUNTER — Other Ambulatory Visit (HOSPITAL_BASED_OUTPATIENT_CLINIC_OR_DEPARTMENT_OTHER): Payer: Commercial Managed Care - PPO | Admitting: Lab

## 2013-03-25 ENCOUNTER — Ambulatory Visit (HOSPITAL_BASED_OUTPATIENT_CLINIC_OR_DEPARTMENT_OTHER): Payer: Commercial Managed Care - PPO

## 2013-03-25 VITALS — BP 133/77 | HR 74 | Temp 98.7°F | Resp 18

## 2013-03-25 DIAGNOSIS — C8195 Hodgkin lymphoma, unspecified, lymph nodes of inguinal region and lower limb: Secondary | ICD-10-CM

## 2013-03-25 DIAGNOSIS — Z5111 Encounter for antineoplastic chemotherapy: Secondary | ICD-10-CM

## 2013-03-25 DIAGNOSIS — C8119 Nodular sclerosis classical Hodgkin lymphoma, extranodal and solid organ sites: Secondary | ICD-10-CM

## 2013-03-25 LAB — CBC WITH DIFFERENTIAL/PLATELET
BASO%: 0.5 % (ref 0.0–2.0)
Eosinophils Absolute: 0 10*3/uL (ref 0.0–0.5)
HCT: 34.9 % — ABNORMAL LOW (ref 38.4–49.9)
HGB: 11.6 g/dL — ABNORMAL LOW (ref 13.0–17.1)
MCHC: 33.2 g/dL (ref 32.0–36.0)
MONO#: 1.1 10*3/uL — ABNORMAL HIGH (ref 0.1–0.9)
NEUT#: 6.3 10*3/uL (ref 1.5–6.5)
NEUT%: 77.5 % — ABNORMAL HIGH (ref 39.0–75.0)
WBC: 8.1 10*3/uL (ref 4.0–10.3)
lymph#: 0.6 10*3/uL — ABNORMAL LOW (ref 0.9–3.3)

## 2013-03-25 LAB — COMPREHENSIVE METABOLIC PANEL (CC13)
ALT: 65 U/L — ABNORMAL HIGH (ref 0–55)
CO2: 27 mEq/L (ref 22–29)
Calcium: 9.2 mg/dL (ref 8.4–10.4)
Chloride: 107 mEq/L (ref 98–107)
Creatinine: 0.9 mg/dL (ref 0.7–1.3)
Glucose: 89 mg/dl (ref 70–99)
Total Protein: 6.7 g/dL (ref 6.4–8.3)

## 2013-03-25 LAB — LACTATE DEHYDROGENASE (CC13): LDH: 241 U/L (ref 125–245)

## 2013-03-25 MED ORDER — HEPARIN SOD (PORK) LOCK FLUSH 100 UNIT/ML IV SOLN
500.0000 [IU] | Freq: Once | INTRAVENOUS | Status: AC | PRN
  Administered 2013-03-25: 500 [IU]
  Filled 2013-03-25: qty 5

## 2013-03-25 MED ORDER — MESNA 100 MG/ML IV SOLN
400.0000 mg/m2 | Freq: Once | INTRAVENOUS | Status: AC
  Administered 2013-03-25: 800 mg via INTRAVENOUS
  Filled 2013-03-25: qty 8

## 2013-03-25 MED ORDER — ONDANSETRON 8 MG/50ML IVPB (CHCC)
8.0000 mg | Freq: Once | INTRAVENOUS | Status: AC
  Administered 2013-03-25: 8 mg via INTRAVENOUS

## 2013-03-25 MED ORDER — SODIUM CHLORIDE 0.9 % IV SOLN
Freq: Once | INTRAVENOUS | Status: AC
  Administered 2013-03-25: 09:00:00 via INTRAVENOUS

## 2013-03-25 MED ORDER — SODIUM CHLORIDE 0.9 % IV SOLN
100.0000 mg/m2 | Freq: Once | INTRAVENOUS | Status: AC
  Administered 2013-03-25: 200 mg via INTRAVENOUS
  Filled 2013-03-25: qty 10

## 2013-03-25 MED ORDER — SODIUM CHLORIDE 0.9 % IV SOLN
Freq: Once | INTRAVENOUS | Status: AC
  Administered 2013-03-25: 11:00:00 via INTRAVENOUS
  Filled 2013-03-25: qty 60

## 2013-03-25 MED ORDER — SODIUM CHLORIDE 0.9 % IJ SOLN
10.0000 mL | INTRAMUSCULAR | Status: DC | PRN
  Administered 2013-03-25: 10 mL
  Filled 2013-03-25: qty 10

## 2013-03-25 MED ORDER — DEXAMETHASONE SODIUM PHOSPHATE 10 MG/ML IJ SOLN
10.0000 mg | Freq: Once | INTRAMUSCULAR | Status: AC
  Administered 2013-03-25: 10 mg via INTRAVENOUS

## 2013-03-25 NOTE — Patient Instructions (Addendum)
Etoposide, VP-16 injection What is this medicine? ETOPOSIDE, VP-16 (e toe POE side) is a chemotherapy drug. It is used to treat testicular cancer, lung cancer, and other cancers. This medicine may be used for other purposes; ask your health care provider or pharmacist if you have questions. What should I tell my health care provider before I take this medicine? They need to know if you have any of these conditions: -infection -kidney disease -low blood counts, like low white cell, platelet, or red cell counts -an unusual or allergic reaction to etoposide, other chemotherapeutic agents, other medicines, foods, dyes, or preservatives -pregnant or trying to get pregnant -breast-feeding How should I use this medicine? This medicine is for infusion into a vein. It is administered in a hospital or clinic by a specially trained health care professional. Talk to your pediatrician regarding the use of this medicine in children. Special care may be needed. Overdosage: If you think you have taken too much of this medicine contact a poison control center or emergency room at once. NOTE: This medicine is only for you. Do not share this medicine with others. What if I miss a dose? It is important not to miss your dose. Call your doctor or health care professional if you are unable to keep an appointment. What may interact with this medicine? -cyclosporine -medicines to increase blood counts like filgrastim, pegfilgrastim, sargramostim -vaccines This list may not describe all possible interactions. Give your health care provider a list of all the medicines, herbs, non-prescription drugs, or dietary supplements you use. Also tell them if you smoke, drink alcohol, or use illegal drugs. Some items may interact with your medicine. What should I watch for while using this medicine? Visit your doctor for checks on your progress. This drug may make you feel generally unwell. This is not uncommon, as chemotherapy  can affect healthy cells as well as cancer cells. Report any side effects. Continue your course of treatment even though you feel ill unless your doctor tells you to stop. In some cases, you may be given additional medicines to help with side effects. Follow all directions for their use. Call your doctor or health care professional for advice if you get a fever, chills or sore throat, or other symptoms of a cold or flu. Do not treat yourself. This drug decreases your body's ability to fight infections. Try to avoid being around people who are sick. This medicine may increase your risk to bruise or bleed. Call your doctor or health care professional if you notice any unusual bleeding. Be careful brushing and flossing your teeth or using a toothpick because you may get an infection or bleed more easily. If you have any dental work done, tell your dentist you are receiving this medicine. Avoid taking products that contain aspirin, acetaminophen, ibuprofen, naproxen, or ketoprofen unless instructed by your doctor. These medicines may hide a fever. Do not become pregnant while taking this medicine. Women should inform their doctor if they wish to become pregnant or think they might be pregnant. There is a potential for serious side effects to an unborn child. Talk to your health care professional or pharmacist for more information. Do not breast-feed an infant while taking this medicine. What side effects may I notice from receiving this medicine? Side effects that you should report to your doctor or health care professional as soon as possible: -allergic reactions like skin rash, itching or hives, swelling of the face, lips, or tongue -low blood counts - this medicine  may decrease the number of white blood cells, red blood cells and platelets. You may be at increased risk for infections and bleeding. -signs of infection - fever or chills, cough, sore throat, pain or difficulty passing urine -signs of  decreased platelets or bleeding - bruising, pinpoint red spots on the skin, black, tarry stools, blood in the urine -signs of decreased red blood cells - unusually weak or tired, fainting spells, lightheadedness -breathing problems -changes in vision -mouth or throat sores or ulcers -pain, redness, swelling or irritation at the injection site -pain, tingling, numbness in the hands or feet -redness, blistering, peeling or loosening of the skin, including inside the mouth -seizures -vomiting Side effects that usually do not require medical attention (report to your doctor or health care professional if they continue or are bothersome): -diarrhea -hair loss -loss of appetite -nausea -stomach pain This list may not describe all possible side effects. Call your doctor for medical advice about side effects. You may report side effects to FDA at 1-800-FDA-1088. Where should I keep my medicine? This drug is given in a hospital or clinic and will not be stored at home. NOTE: This sheet is a summary. It may not cover all possible information. If you have questions about this medicine, talk to your doctor, pharmacist, or health care provider.  2012, Elsevier/Gold Standard. (03/15/2008 5:24:12 PM)Mesna injection What is this medicine? MESNA (MES na) is used to prevent bleeding from the bladder during treatment with ifosfamide. This medicine does not reduce the chance of getting other side effects of cancer chemotherapy. This medicine may be used for other purposes; ask your health care provider or pharmacist if you have questions. What should I tell my health care provider before I take this medicine? They need to know if you have any of these conditions: -autoimmune disease like lupus, nephritis, or rheumatoid arthritis -an unusual or allergic reaction to mesna, benzyl alcohol, sulfur medicines, other medicines, foods, dyes, or preservatives -pregnant or trying to get pregnant -breast-feeding How  should I use this medicine? This medicine is for infusion into a vein. It is given by a health care professional in a hospital or clinic setting. Talk to your pediatrician regarding the use of this medicine in children. Special care may be needed. Overdosage: If you think you have taken too much of this medicine contact a poison control center or emergency room at once. NOTE: This medicine is only for you. Do not share this medicine with others. What if I miss a dose? This does not apply. What may interact with this medicine? Interactions are not expected. This list may not describe all possible interactions. Give your health care provider a list of all the medicines, herbs, non-prescription drugs, or dietary supplements you use. Also tell them if you smoke, drink alcohol, or use illegal drugs. Some items may interact with your medicine. What should I watch for while using this medicine? Your doctor will follow your condition closely while you are taking this medicine. Tell your doctor right away if you see that your urine has turned a pink or red color. It is important to drink at least a quart (4 cups) of fluids each day that you take this medicine. What side effects may I notice from receiving this medicine? Side effects that you should report to your doctor or health care professional as soon as possible: -allergic reactions like skin rash, itching or hives, swelling of the face, lips, or tongue -breathing problems -blood in your urine or  pink to red colored urine -fever, chills, or sore throat -flushing or redness to skin -mouth sores -pain or redness at site where injected -swelling of ankles or feet -vomiting Side effects that usually do not require medical attention (report to your doctor or health care professional if they continue or are bothersome): -aches and pains -bad taste in mouth -diarrhea -dizziness -hair loss -headache -nausea This list may not describe all possible  side effects. Call your doctor for medical advice about side effects. You may report side effects to FDA at 1-800-FDA-1088. Where should I keep my medicine? This drug is given in a hospital or clinic and will not be stored at home. NOTE: This sheet is a summary. It may not cover all possible information. If you have questions about this medicine, talk to your doctor, pharmacist, or health care provider.  2012, Elsevier/Gold Standard. (05/31/2008 4:43:56 PM)Ifosfamide injection What is this medicine? IFOSFAMIDE (eye FOS fa mide) is a chemotherapy drug. It slows the growth of cancer cells. This medicine is used to treat testicular cancer. This medicine may be used for other purposes; ask your health care provider or pharmacist if you have questions. What should I tell my health care provider before I take this medicine? They need to know if you have any of these conditions: -bladder problems -blood disorders -dehydration -infection (especially a virus infection such as chickenpox, cold sores, or herpes) -kidney disease -liver disease -recent or ongoing radiation therapy -an unusual or allergic reaction to ifosfamide, other chemotherapy, other medicines, foods, dyes, or preservatives -pregnant or trying to get pregnant -breast-feeding How should I use this medicine? This drug is given as an infusion into a vein. It is administered in a hospital or clinic by a specially trained health care professional. Talk to your pediatrician regarding the use of this medicine in children. Special care may be needed. Overdosage: If you think you have taken too much of this medicine contact a poison control center or emergency room at once. NOTE: This medicine is only for you. Do not share this medicine with others. What if I miss a dose? It is important not to miss your dose. Call your doctor or health care professional if you are unable to keep an appointment. What may interact with this medicine? Do not  take this medicine with any of the following medications: -nalidixic acid This medicine may also interact with the following medications: -medicines to increase blood counts like filgrastim, pegfilgrastim, sargramostim -St. John's Wort -vaccines Talk to your doctor or health care professional before taking any of these medicines: -acetaminophen -aspirin -ibuprofen -ketoprofen -naproxen This list may not describe all possible interactions. Give your health care provider a list of all the medicines, herbs, non-prescription drugs, or dietary supplements you use. Also tell them if you smoke, drink alcohol, or use illegal drugs. Some items may interact with your medicine. What should I watch for while using this medicine? Visit your doctor for checks on your progress. This drug may make you feel generally unwell. This is not uncommon, as chemotherapy can affect healthy cells as well as cancer cells. Report any side effects. Continue your course of treatment even though you feel ill unless your doctor tells you to stop. Drink water or other fluids as directed. Urinate often, even at night. In some cases, you may be given additional medicines to help with side effects. Follow all directions for their use. Call your doctor or health care professional for advice if you get a fever, chills  or sore throat, or other symptoms of a cold or flu. Do not treat yourself. This drug decreases your body's ability to fight infections. Try to avoid being around people who are sick. This medicine may increase your risk to bruise or bleed. Call your doctor or health care professional if you notice any unusual bleeding. Be careful brushing and flossing your teeth or using a toothpick because you may get an infection or bleed more easily. If you have any dental work done, tell your dentist you are receiving this medicine. Avoid taking products that contain aspirin, acetaminophen, ibuprofen, naproxen, or ketoprofen unless  instructed by your doctor. These medicines may hide a fever. Do not become pregnant while taking this medicine. Women should inform their doctor if they wish to become pregnant or think they might be pregnant. There is a potential for serious side effects to an unborn child. Talk to your health care professional or pharmacist for more information. Do not breast-feed an infant while taking this medicine. What side effects may I notice from receiving this medicine? Side effects that you should report to your doctor or health care professional as soon as possible: -allergic reactions like skin rash, itching or hives, swelling of the face, lips, or tongue -low blood counts - this medicine may decrease the number of white blood cells, red blood cells and platelets. You may be at increased risk for infections and bleeding. -signs of infection - fever or chills, cough, sore throat, pain or difficulty passing urine -signs of decreased platelets or bleeding - bruising, pinpoint red spots on the skin, black, tarry stools, blood in the urine -signs of decreased red blood cells - unusually weak or tired, fainting spells, lightheadedness -agitation -breathing problems -confusion -dark urine -hallucinations -mouth sores -pain, swelling, redness at site where injected -seizures -trouble passing urine or change in the amount of urine -yellowing of the eyes or skin Side effects that usually do not require medical attention (report to your doctor or health care professional if they continue or are bothersome): -diarrhea -hair loss -loss of appetite -nausea, vomiting This list may not describe all possible side effects. Call your doctor for medical advice about side effects. You may report side effects to FDA at 1-800-FDA-1088. Where should I keep my medicine? This drug is given in a hospital or clinic and will not be stored at home. NOTE: This sheet is a summary. It may not cover all possible information.  If you have questions about this medicine, talk to your doctor, pharmacist, or health care provider.  2013, Elsevier/Gold Standard. (03/30/2008 4:23:05 PM)

## 2013-03-26 ENCOUNTER — Ambulatory Visit (HOSPITAL_BASED_OUTPATIENT_CLINIC_OR_DEPARTMENT_OTHER): Payer: Commercial Managed Care - PPO

## 2013-03-26 VITALS — BP 129/69 | HR 88 | Temp 98.0°F

## 2013-03-26 DIAGNOSIS — Z5111 Encounter for antineoplastic chemotherapy: Secondary | ICD-10-CM

## 2013-03-26 DIAGNOSIS — C8195 Hodgkin lymphoma, unspecified, lymph nodes of inguinal region and lower limb: Secondary | ICD-10-CM

## 2013-03-26 DIAGNOSIS — C8119 Nodular sclerosis classical Hodgkin lymphoma, extranodal and solid organ sites: Secondary | ICD-10-CM

## 2013-03-26 MED ORDER — SODIUM CHLORIDE 0.9 % IV SOLN
400.0000 mg/m2 | Freq: Once | INTRAVENOUS | Status: AC
  Administered 2013-03-26: 800 mg via INTRAVENOUS
  Filled 2013-03-26: qty 8

## 2013-03-26 MED ORDER — DEXAMETHASONE SODIUM PHOSPHATE 20 MG/5ML IJ SOLN
20.0000 mg | Freq: Once | INTRAMUSCULAR | Status: AC
  Administered 2013-03-26: 20 mg via INTRAVENOUS
  Filled 2013-03-26: qty 5

## 2013-03-26 MED ORDER — SODIUM CHLORIDE 0.9 % IJ SOLN
10.0000 mL | INTRAMUSCULAR | Status: DC | PRN
  Administered 2013-03-26: 10 mL
  Filled 2013-03-26: qty 10

## 2013-03-26 MED ORDER — ONDANSETRON 16 MG/50ML IVPB (CHCC)
16.0000 mg | Freq: Once | INTRAVENOUS | Status: AC
  Administered 2013-03-26: 16 mg via INTRAVENOUS

## 2013-03-26 MED ORDER — SODIUM CHLORIDE 0.9 % IV SOLN
Freq: Once | INTRAVENOUS | Status: AC
  Administered 2013-03-26: 11:00:00 via INTRAVENOUS
  Filled 2013-03-26: qty 60

## 2013-03-26 MED ORDER — SODIUM CHLORIDE 0.9 % IV SOLN
Freq: Once | INTRAVENOUS | Status: AC
  Administered 2013-03-26: 10:00:00 via INTRAVENOUS

## 2013-03-26 MED ORDER — SODIUM CHLORIDE 0.9 % IV SOLN
100.0000 mg/m2 | Freq: Once | INTRAVENOUS | Status: AC
  Administered 2013-03-26: 200 mg via INTRAVENOUS
  Filled 2013-03-26: qty 10

## 2013-03-26 MED ORDER — HEPARIN SOD (PORK) LOCK FLUSH 100 UNIT/ML IV SOLN
250.0000 [IU] | Freq: Once | INTRAVENOUS | Status: AC | PRN
  Administered 2013-03-26: 250 [IU]
  Filled 2013-03-26: qty 5

## 2013-03-26 MED ORDER — HEPARIN SOD (PORK) LOCK FLUSH 100 UNIT/ML IV SOLN
500.0000 [IU] | Freq: Once | INTRAVENOUS | Status: DC | PRN
  Filled 2013-03-26: qty 5

## 2013-03-26 MED ORDER — SODIUM CHLORIDE 0.9 % IV SOLN
580.0000 mg | Freq: Once | INTRAVENOUS | Status: AC
  Administered 2013-03-26: 580 mg via INTRAVENOUS
  Filled 2013-03-26: qty 58

## 2013-03-26 NOTE — Patient Instructions (Addendum)
[facility] Discharge Instructions for Patients Receiving Chemotherapy  Today you received the following chemotherapy agents:  Ifex mesna etoposide carboplatin.   To help prevent nausea and vomiting after your treatment, we encourage you to take your nausea medication.  Take it as often as prescribed.     If you develop nausea and vomiting that is not controlled by your nausea medication, call the clinic. If it is after clinic hours your family physician or the after hours number for the clinic or go to the Emergency Department.   BELOW ARE SYMPTOMS THAT SHOULD BE REPORTED IMMEDIATELY:  *FEVER GREATER THAN 100.5 F  *CHILLS WITH OR WITHOUT FEVER  NAUSEA AND VOMITING THAT IS NOT CONTROLLED WITH YOUR NAUSEA MEDICATION  *UNUSUAL SHORTNESS OF BREATH  *UNUSUAL BRUISING OR BLEEDING  TENDERNESS IN MOUTH AND THROAT WITH OR WITHOUT PRESENCE OF ULCERS  *URINARY PROBLEMS  *BOWEL PROBLEMS  UNUSUAL RASH Items with * indicate a potential emergency and should be followed up as soon as possible.  Feel free to call the clinic you have any questions or concerns. The clinic phone number is [phone].   I have been informed and understand all the instructions given to me. I know to contact the clinic, my physician, or go to the Emergency Department if any problems should occur. I do not have any questions at this time, but understand that I may call the clinic during office hours   should I have any questions or need assistance in obtaining follow up care.    __________________________________________  _____________  __________ Signature of Patient or Authorized Representative            Date                   Time    __________________________________________ Nurse's Signature

## 2013-03-27 ENCOUNTER — Ambulatory Visit (HOSPITAL_BASED_OUTPATIENT_CLINIC_OR_DEPARTMENT_OTHER): Payer: Commercial Managed Care - PPO

## 2013-03-27 VITALS — BP 128/84 | HR 79 | Temp 97.6°F | Resp 16

## 2013-03-27 DIAGNOSIS — C8195 Hodgkin lymphoma, unspecified, lymph nodes of inguinal region and lower limb: Secondary | ICD-10-CM

## 2013-03-27 DIAGNOSIS — C8119 Nodular sclerosis classical Hodgkin lymphoma, extranodal and solid organ sites: Secondary | ICD-10-CM

## 2013-03-27 DIAGNOSIS — Z5111 Encounter for antineoplastic chemotherapy: Secondary | ICD-10-CM

## 2013-03-27 MED ORDER — HEPARIN SOD (PORK) LOCK FLUSH 100 UNIT/ML IV SOLN
500.0000 [IU] | Freq: Once | INTRAVENOUS | Status: AC | PRN
  Administered 2013-03-27: 500 [IU]
  Filled 2013-03-27: qty 5

## 2013-03-27 MED ORDER — DEXAMETHASONE SODIUM PHOSPHATE 10 MG/ML IJ SOLN
10.0000 mg | Freq: Once | INTRAMUSCULAR | Status: AC
  Administered 2013-03-27: 10 mg via INTRAVENOUS

## 2013-03-27 MED ORDER — SODIUM CHLORIDE 0.9 % IV SOLN
100.0000 mg/m2 | Freq: Once | INTRAVENOUS | Status: AC
  Administered 2013-03-27: 200 mg via INTRAVENOUS
  Filled 2013-03-27: qty 10

## 2013-03-27 MED ORDER — SODIUM CHLORIDE 0.9 % IV SOLN
Freq: Once | INTRAVENOUS | Status: AC
  Administered 2013-03-27: 09:00:00 via INTRAVENOUS

## 2013-03-27 MED ORDER — SODIUM CHLORIDE 0.9 % IV SOLN
Freq: Once | INTRAVENOUS | Status: AC
  Administered 2013-03-27: 12:00:00 via INTRAVENOUS
  Filled 2013-03-27: qty 60

## 2013-03-27 MED ORDER — SODIUM CHLORIDE 0.9 % IV SOLN
400.0000 mg/m2 | Freq: Once | INTRAVENOUS | Status: AC
  Administered 2013-03-27: 800 mg via INTRAVENOUS
  Filled 2013-03-27: qty 8

## 2013-03-27 MED ORDER — ONDANSETRON 8 MG/50ML IVPB (CHCC)
8.0000 mg | Freq: Once | INTRAVENOUS | Status: AC
  Administered 2013-03-27: 8 mg via INTRAVENOUS

## 2013-03-27 MED ORDER — SODIUM CHLORIDE 0.9 % IJ SOLN
10.0000 mL | INTRAMUSCULAR | Status: DC | PRN
  Administered 2013-03-27: 10 mL
  Filled 2013-03-27: qty 10

## 2013-03-27 NOTE — Patient Instructions (Addendum)
[facility] Discharge Instructions for Patients Receiving Chemotherapy  Today you received the following chemotherapy agents Ifosfamide, mesna, etoposide  To help prevent nausea and vomiting after your treatment, we encourage you to take your nausea medication if needed Begin taking it at 4 pm as prescribed.   If you develop nausea and vomiting that is not controlled by your nausea medication, call the clinic. If it is after clinic hours your family physician or the after hours number for the clinic or go to the Emergency Department.   BELOW ARE SYMPTOMS THAT SHOULD BE REPORTED IMMEDIATELY:  *FEVER GREATER THAN 100.5 F  *CHILLS WITH OR WITHOUT FEVER  NAUSEA AND VOMITING THAT IS NOT CONTROLLED WITH YOUR NAUSEA MEDICATION  *UNUSUAL SHORTNESS OF BREATH  *UNUSUAL BRUISING OR BLEEDING  TENDERNESS IN MOUTH AND THROAT WITH OR WITHOUT PRESENCE OF ULCERS  *URINARY PROBLEMS  *BOWEL PROBLEMS  UNUSUAL RASH Items with * indicate a potential emergency and should be followed up as soon as possible.  Feel free to call the clinic you have any questions or concerns. The clinic phone number is [phone].   I have been informed and understand all the instructions given to me. I know to contact the clinic, my physician, or go to the Emergency Department if any problems should occur. I do not have any questions at this time, but understand that I may call the clinic during office hours   should I have any questions or need assistance in obtaining follow up care.    __________________________________________  _____________  __________ Signature of Patient or Authorized Representative            Date                   Time    __________________________________________ Nurse's Signature

## 2013-03-28 ENCOUNTER — Ambulatory Visit: Payer: Commercial Managed Care - PPO

## 2013-03-30 ENCOUNTER — Other Ambulatory Visit: Payer: Self-pay | Admitting: *Deleted

## 2013-03-30 ENCOUNTER — Other Ambulatory Visit (HOSPITAL_BASED_OUTPATIENT_CLINIC_OR_DEPARTMENT_OTHER): Payer: Commercial Managed Care - PPO | Admitting: Lab

## 2013-03-30 ENCOUNTER — Ambulatory Visit: Payer: Commercial Managed Care - PPO

## 2013-03-30 VITALS — BP 119/74 | HR 83 | Temp 97.3°F | Resp 16

## 2013-03-30 DIAGNOSIS — C8195 Hodgkin lymphoma, unspecified, lymph nodes of inguinal region and lower limb: Secondary | ICD-10-CM

## 2013-03-30 DIAGNOSIS — C8190 Hodgkin lymphoma, unspecified, unspecified site: Secondary | ICD-10-CM

## 2013-03-30 DIAGNOSIS — C8119 Nodular sclerosis classical Hodgkin lymphoma, extranodal and solid organ sites: Secondary | ICD-10-CM

## 2013-03-30 LAB — CBC WITH DIFFERENTIAL (CANCER CENTER ONLY)
BASO%: 0.1 % (ref 0.0–2.0)
EOS%: 0 % (ref 0.0–7.0)
HCT: 33.1 % — ABNORMAL LOW (ref 38.7–49.9)
LYMPH%: 1.2 % — ABNORMAL LOW (ref 14.0–48.0)
MCH: 31.2 pg (ref 28.0–33.4)
MCHC: 33.5 g/dL (ref 32.0–35.9)
MCV: 93 fL (ref 82–98)
MONO#: 0.1 10*3/uL (ref 0.1–0.9)
MONO%: 0.4 % (ref 0.0–13.0)
NEUT%: 98.3 % — ABNORMAL HIGH (ref 40.0–80.0)
Platelets: 167 10*3/uL (ref 145–400)
RDW: 15.1 % (ref 11.1–15.7)
WBC: 30.4 10*3/uL — ABNORMAL HIGH (ref 4.0–10.0)

## 2013-03-30 LAB — CMP (CANCER CENTER ONLY)
ALT(SGPT): 57 U/L — ABNORMAL HIGH (ref 10–47)
AST: 38 U/L (ref 11–38)
Alkaline Phosphatase: 95 U/L — ABNORMAL HIGH (ref 26–84)
Creat: 0.7 mg/dl (ref 0.6–1.2)
Sodium: 140 mEq/L (ref 128–145)
Total Bilirubin: 0.7 mg/dl (ref 0.20–1.60)
Total Protein: 6.9 g/dL (ref 6.4–8.1)

## 2013-03-30 MED ORDER — SODIUM CHLORIDE 0.9 % IJ SOLN
10.0000 mL | INTRAMUSCULAR | Status: DC | PRN
  Administered 2013-03-30: 10 mL via INTRAVENOUS
  Filled 2013-03-30: qty 10

## 2013-03-30 MED ORDER — HEPARIN SOD (PORK) LOCK FLUSH 100 UNIT/ML IV SOLN
500.0000 [IU] | Freq: Once | INTRAVENOUS | Status: AC
  Administered 2013-03-30: 500 [IU] via INTRAVENOUS
  Filled 2013-03-30: qty 5

## 2013-03-30 NOTE — Progress Notes (Signed)
Brian [name] presented for Hickman line flush. Proper placement of Hickman confirmed by CXR. PICC line located in the right antecubital. Clean, Dry and Intact Good blood return present. Hickman line flushed with 20ml NS and 250U/2.7ml  Heparin per protocol. Procedure without incident. Patient tolerated procedure well.

## 2013-03-30 NOTE — Progress Notes (Signed)
Labs entered per "Post-Priming Chemo Orders" from [place].

## 2013-04-01 ENCOUNTER — Other Ambulatory Visit: Payer: Commercial Managed Care - PPO | Admitting: Lab

## 2013-04-01 ENCOUNTER — Ambulatory Visit: Payer: Commercial Managed Care - PPO

## 2013-04-01 ENCOUNTER — Other Ambulatory Visit (HOSPITAL_BASED_OUTPATIENT_CLINIC_OR_DEPARTMENT_OTHER): Payer: Commercial Managed Care - PPO | Admitting: Lab

## 2013-04-01 DIAGNOSIS — C8119 Nodular sclerosis classical Hodgkin lymphoma, extranodal and solid organ sites: Secondary | ICD-10-CM

## 2013-04-01 DIAGNOSIS — C8195 Hodgkin lymphoma, unspecified, lymph nodes of inguinal region and lower limb: Secondary | ICD-10-CM

## 2013-04-01 LAB — CBC WITH DIFFERENTIAL (CANCER CENTER ONLY)
BASO%: 0.1 % (ref 0.0–2.0)
HCT: 29.5 % — ABNORMAL LOW (ref 38.7–49.9)
LYMPH%: 3 % — ABNORMAL LOW (ref 14.0–48.0)
MCH: 31.2 pg (ref 28.0–33.4)
MCV: 94 fL (ref 82–98)
MONO#: 0.4 10*3/uL (ref 0.1–0.9)
MONO%: 3.6 % (ref 0.0–13.0)
NEUT%: 93.3 % — ABNORMAL HIGH (ref 40.0–80.0)
Platelets: 97 10*3/uL — ABNORMAL LOW (ref 145–400)
RDW: 14.9 % (ref 11.1–15.7)
WBC: 9.9 10*3/uL (ref 4.0–10.0)

## 2013-04-01 LAB — CMP (CANCER CENTER ONLY)
ALT(SGPT): 62 U/L — ABNORMAL HIGH (ref 10–47)
AST: 43 U/L — ABNORMAL HIGH (ref 11–38)
Albumin: 3.6 g/dL (ref 3.3–5.5)
Alkaline Phosphatase: 106 U/L — ABNORMAL HIGH (ref 26–84)
Potassium: 4.3 mEq/L (ref 3.3–4.7)
Sodium: 140 mEq/L (ref 128–145)
Total Bilirubin: 0.9 mg/dl (ref 0.20–1.60)
Total Protein: 6.7 g/dL (ref 6.4–8.1)

## 2013-04-01 MED ORDER — SODIUM CHLORIDE 0.9 % IJ SOLN
10.0000 mL | INTRAMUSCULAR | Status: DC | PRN
Start: 2013-04-01 — End: 2013-04-01
  Administered 2013-04-01: 10 mL via INTRAVENOUS
  Filled 2013-04-01: qty 10

## 2013-04-01 MED ORDER — HEPARIN SOD (PORK) LOCK FLUSH 100 UNIT/ML IV SOLN
500.0000 [IU] | Freq: Once | INTRAVENOUS | Status: AC
  Administered 2013-04-01: 500 [IU] via INTRAVENOUS
  Filled 2013-04-01: qty 5

## 2013-04-01 NOTE — Patient Instructions (Addendum)
Filgrastim, G-CSF injection What is this medicine? FILGRASTIM, G-CSF (fil GRA stim) stimulates the formation of white blood cells. This medicine is given to patients with conditions that may cause a decrease in white blood cells, like those receiving certain types of chemotherapy or bone marrow transplant. It helps the bone marrow recover its ability to produce white blood cells. Increasing the amount of white blood cells helps to decrease the risk of infection and fever. This medicine may be used for other purposes; ask your health care provider or pharmacist if you have questions. What should I tell my health care provider before I take this medicine? They need to know if you have any of these conditions: -currently receiving radiation therapy -sickle cell disease -an unusual or allergic reaction to filgrastim, E. coli protein, other medicines, foods, dyes, or preservatives -pregnant or trying to get pregnant -breast-feeding How should I use this medicine? This medicine is for injection into a vein or injection under the skin. It is usually given by a health care professional in a hospital or clinic setting. If you get this medicine at home, you will be taught how to prepare and give this medicine. Always change the site for the injection under the skin. Let the solution warm to room temperature before you use it. Do not shake the solution before you withdraw a dose. Throw away any unused portion. Use exactly as directed. Take your medicine at regular intervals. Do not take your medicine more often than directed. It is important that you put your used needles and syringes in a special sharps container. Do not put them in a trash can. If you do not have a sharps container, call your pharmacist or healthcare provider to get one. Talk to your pediatrician regarding the use of this medicine in children. While this medicine may be prescribed for children for selected conditions, precautions do  apply. Overdosage: If you think you have taken too much of this medicine contact a poison control center or emergency room at once. NOTE: This medicine is only for you. Do not share this medicine with others. What if I miss a dose? Try not to miss doses. If you miss a dose take the dose as soon as you remember. If it is almost time for the next dose, do not take double doses unless told to by your doctor or health care professional. What may interact with this medicine? -lithium -medicines for cancer chemotherapy This list may not describe all possible interactions. Give your health care provider a list of all the medicines, herbs, non-prescription drugs, or dietary supplements you use. Also tell them if you smoke, drink alcohol, or use illegal drugs. Some items may interact with your medicine. What should I watch for while using this medicine? Visit your doctor or health care professional for regular checks on your progress. If you get a fever or any sign of infection while you are using this medicine, do not treat yourself. Check with your doctor or health care professional. Bone pain can usually be relieved by mild pain relievers such as acetaminophen or ibuprofen. Check with your doctor or health care professional before taking these medicines as they may hide a fever. Call your doctor or health care professional if the aches and pains are severe or do not go away. What side effects may I notice from receiving this medicine? Side effects that you should report to your doctor or health care professional as soon as possible: -allergic reactions like skin rash, itching   or hives, swelling of the face, lips, or tongue -difficulty breathing, wheezing -fever -pain, redness, or swelling at the injection site -stomach or side pain, or pain at the shoulder Side effects that usually do not require medical attention (report to your doctor or health care professional if they continue or are  bothersome): -bone pain (ribs, lower back, breast bone) -headache -skin rash This list may not describe all possible side effects. Call your doctor for medical advice about side effects. You may report side effects to FDA at 1-800-FDA-1088. Where should I keep my medicine? Keep out of the reach of children. Store in a refrigerator between 2 and 8 degrees C (36 and 46 degrees F). Do not freeze or leave in direct sunlight. If vials or syringes are left out of the refrigerator for more than 24 hours, they must be thrown away. Throw away unused vials after the expiration date on the carton. NOTE: This sheet is a summary. It may not cover all possible information. If you have questions about this medicine, talk to your doctor, pharmacist, or health care provider.  2013, [employer]vier/[employer]. (01/28/2008 1:33:21 PM)  

## 2013-04-03 ENCOUNTER — Ambulatory Visit: Payer: Commercial Managed Care - PPO

## 2013-04-03 ENCOUNTER — Other Ambulatory Visit (HOSPITAL_BASED_OUTPATIENT_CLINIC_OR_DEPARTMENT_OTHER): Payer: Commercial Managed Care - PPO | Admitting: Lab

## 2013-04-03 DIAGNOSIS — C8195 Hodgkin lymphoma, unspecified, lymph nodes of inguinal region and lower limb: Secondary | ICD-10-CM

## 2013-04-03 DIAGNOSIS — C8119 Nodular sclerosis classical Hodgkin lymphoma, extranodal and solid organ sites: Secondary | ICD-10-CM

## 2013-04-03 LAB — CBC WITH DIFFERENTIAL (CANCER CENTER ONLY)
BASO%: 0.2 % (ref 0.0–2.0)
EOS%: 0.2 % (ref 0.0–7.0)
Eosinophils Absolute: 0 10*3/uL (ref 0.0–0.5)
LYMPH%: 7.6 % — ABNORMAL LOW (ref 14.0–48.0)
MCH: 31.3 pg (ref 28.0–33.4)
MCHC: 33.2 g/dL (ref 32.0–35.9)
MCV: 94 fL (ref 82–98)
MONO%: 22.8 % — ABNORMAL HIGH (ref 0.0–13.0)
NEUT#: 3.6 10*3/uL (ref 1.5–6.5)
Platelets: 56 10*3/uL — ABNORMAL LOW (ref 145–400)
RDW: 14.7 % (ref 11.1–15.7)

## 2013-04-03 LAB — COMPREHENSIVE METABOLIC PANEL
AST: 26 U/L (ref 0–37)
Alkaline Phosphatase: 94 U/L (ref 39–117)
Glucose, Bld: 85 mg/dL (ref 70–99)
Sodium: 139 mEq/L (ref 135–145)
Total Bilirubin: 0.6 mg/dL (ref 0.3–1.2)
Total Protein: 6.1 g/dL (ref 6.0–8.3)

## 2013-04-03 MED ORDER — HEPARIN SOD (PORK) LOCK FLUSH 100 UNIT/ML IV SOLN
250.0000 [IU] | Freq: Once | INTRAVENOUS | Status: AC
  Administered 2013-04-03: 250 [IU] via INTRAVENOUS
  Filled 2013-04-03: qty 5

## 2013-04-03 MED ORDER — SODIUM CHLORIDE 0.9 % IJ SOLN
10.0000 mL | Freq: Once | INTRAMUSCULAR | Status: AC
  Administered 2013-04-03: 10 mL via INTRAVENOUS
  Filled 2013-04-03: qty 10

## 2013-04-03 NOTE — Patient Instructions (Addendum)
Neutropenia Neutropenia is a condition that occurs when the level of a certain type of white blood cell (neutrophil) in your body becomes lower than normal. Neutrophils are made in the bone marrow and fight infections. These cells protect against bacteria and viruses. The fewer neutrophils you have, and the longer your body remains without them, the greater your risk of getting a severe infection becomes. CAUSES  The cause of neutropenia may be hard to determine. However, it is usually due to 3 main problems:   Decreased production of neutrophils. This may be due to:  Certain medicines such as chemotherapy.  Genetic problems.  Cancer.  Radiation treatments.  Vitamin deficiency.  Some pesticides.  Increased destruction of neutrophils. This may be due to:  Overwhelming infections.  Hemolytic anemia. This is when the body destroys its own blood cells.  Chemotherapy.  Neutrophils moving to areas of the body where they cannot fight infections. This may be due to:  Dialysis procedures.  Conditions where the spleen becomes enlarged. Neutrophils are held in the spleen and are not available to the rest of the body.  Overwhelming infections. The neutrophils are held in the area of the infection and are not available to the rest of the body. SYMPTOMS  There are no specific symptoms of neutropenia. The lack of neutrophils can result in an infection, and an infection can cause various problems. DIAGNOSIS  Diagnosis is made by a blood test. A complete blood count is performed. The normal level of neutrophils in human blood differs with age and race. Infants have lower counts than older children and adults. African Americans have lower counts than Caucasians or Asians. The average adult level is 1500 cells/mm3 of blood. Neutrophil counts are interpreted as follows:  Greater than 1000 cells/mm3 gives normal protection against infection.  500 to 1000 cells/mm3 gives an increased risk for  infection.  200 to 500 cells/mm3 is a greater risk for severe infection.  Lower than 200 cells/mm3 is a marked risk of infection. This may require hospitalization and treatment with antibiotic medicines. TREATMENT  Treatment depends on the underlying cause, severity, and presence of infections or symptoms. It also depends on your health. Your caregiver will discuss the treatment plan with you. Mild cases are often easily treated and have a good outcome. Preventative measures may also be started to limit your risk of infections. Treatment can include:  Taking antibiotics.  Stopping medicines that are known to cause neutropenia.  Correcting nutritional deficiencies by eating green vegetables to supply folic acid and taking vitamin B supplements.  Stopping exposure to pesticides if your neutropenia is related to pesticide exposure.  Taking a blood growth factor called sargramostim, pegfilgrastim, or filgrastim if you are undergoing chemotherapy for cancer. This stimulates white blood cell production.  Removal of the spleen if you have Felty's syndrome and have repeated infections. HOME CARE INSTRUCTIONS   Follow your caregiver's instructions about when you need to have blood work done.  Wash your hands often. Make sure others who come in contact with you also wash their hands.  Wash raw fruits and vegetables before eating them. They can carry bacteria and fungi.  Avoid people with colds or spreadable (contagious) diseases (chickenpox, herpes zoster, influenza).  Avoid large crowds.  Avoid construction areas. The dust can release fungus into the air.  Be cautious around children in daycare or school environments.  Take care of your respiratory system by coughing and deep breathing.  Bathe daily.  Protect your skin from cuts and   areas. The dust can release fungus into the air.   Be cautious around children in daycare or school environments.   Take care of your respiratory system by coughing and deep breathing.   Bathe daily.   Protect your skin from cuts and burns.   Do not work in the garden or with flowers and plants.   Care for the mouth before and after meals by brushing with a soft toothbrush. If you have  mucositis, do not use mouthwash. Mouthwash contains alcohol and can dry out the mouth even more.   Clean the area between the genitals and the anus (perineal area) after urination and bowel movements. Women need to wipe from front to back.   Use a water soluble lubricant during sexual intercourse and practice good hygiene after. Do not have intercourse if you are severely neutropenic. Check with your caregiver for guidelines.   Exercise daily as tolerated.   Avoid people who were vaccinated with a live vaccine in the past 30 days. You should not receive live vaccines (polio, typhoid).   Do not provide direct care for pets. Avoid animal droppings. Do not clean litter boxes and bird cages.   Do not share food utensils.   Do not use tampons, enemas, or rectal suppositories unless directed by your caregiver.   Use an electric razor to remove hair.   Wash your hands after handling magazines, letters, and newspapers.  SEEK IMMEDIATE MEDICAL CARE IF:    You have a fever.   You have chills or start to shake.   You feel nauseous or vomit.   You develop mouth sores.   You develop aches and pains.   You have redness and swelling around open wounds.   Your skin is warm to the touch.   You have pus coming from your wounds.   You develop swollen lymph nodes.   You feel weak or fatigued.   You develop red streaks on the skin.  MAKE SURE YOU:   Understand these instructions.   Will watch your condition.   Will get help right away if you are not doing well or get worse.  Document Released: 05/04/2002 Document Revised: 02/04/2012 Document Reviewed: 06/01/2011  ExitCare Patient Information 2013 [employer].

## 2013-05-06 ENCOUNTER — Telehealth: Payer: Self-pay | Admitting: Hematology & Oncology

## 2013-05-06 NOTE — Telephone Encounter (Signed)
PA from [facility] called [phone]) said pt was leaving Friday and needed follow up on thurs or Friday. I gave her 6-19 appointment. She said she would let pt know.

## 2013-05-12 ENCOUNTER — Other Ambulatory Visit: Payer: Self-pay | Admitting: Hematology & Oncology

## 2013-05-12 ENCOUNTER — Other Ambulatory Visit: Payer: Self-pay | Admitting: *Deleted

## 2013-05-12 DIAGNOSIS — C8190 Hodgkin lymphoma, unspecified, unspecified site: Secondary | ICD-10-CM

## 2013-05-12 DIAGNOSIS — Z9481 Bone marrow transplant status: Secondary | ICD-10-CM

## 2013-05-12 DIAGNOSIS — E878 Other disorders of electrolyte and fluid balance, not elsewhere classified: Secondary | ICD-10-CM

## 2013-05-14 ENCOUNTER — Other Ambulatory Visit (HOSPITAL_BASED_OUTPATIENT_CLINIC_OR_DEPARTMENT_OTHER): Payer: Commercial Managed Care - PPO | Admitting: Lab

## 2013-05-14 ENCOUNTER — Ambulatory Visit (HOSPITAL_BASED_OUTPATIENT_CLINIC_OR_DEPARTMENT_OTHER): Payer: Commercial Managed Care - PPO | Admitting: Hematology & Oncology

## 2013-05-14 VITALS — BP 111/75 | HR 103 | Temp 98.2°F | Resp 13 | Ht 71.0 in | Wt 161.0 lb

## 2013-05-14 DIAGNOSIS — E878 Other disorders of electrolyte and fluid balance, not elsewhere classified: Secondary | ICD-10-CM

## 2013-05-14 DIAGNOSIS — C8110 Nodular sclerosis classical Hodgkin lymphoma, unspecified site: Secondary | ICD-10-CM

## 2013-05-14 DIAGNOSIS — Z9481 Bone marrow transplant status: Secondary | ICD-10-CM

## 2013-05-14 DIAGNOSIS — C8119 Nodular sclerosis classical Hodgkin lymphoma, extranodal and solid organ sites: Secondary | ICD-10-CM

## 2013-05-14 LAB — CBC WITH DIFFERENTIAL (CANCER CENTER ONLY)
BASO#: 0 10*3/uL (ref 0.0–0.2)
Eosinophils Absolute: 0 10*3/uL (ref 0.0–0.5)
HCT: 30.8 % — ABNORMAL LOW (ref 38.7–49.9)
HGB: 10.1 g/dL — ABNORMAL LOW (ref 13.0–17.1)
LYMPH%: 25.1 % (ref 14.0–48.0)
MCH: 31.8 pg (ref 28.0–33.4)
MCV: 97 fL (ref 82–98)
MONO#: 0.7 10*3/uL (ref 0.1–0.9)
MONO%: 24 % — ABNORMAL HIGH (ref 0.0–13.0)
NEUT%: 49.8 % (ref 40.0–80.0)
RBC: 3.18 10*6/uL — ABNORMAL LOW (ref 4.20–5.70)

## 2013-05-14 LAB — CMP (CANCER CENTER ONLY)
ALT(SGPT): 30 U/L (ref 10–47)
Alkaline Phosphatase: 78 U/L (ref 26–84)
Potassium: 4.2 mEq/L (ref 3.3–4.7)
Sodium: 139 mEq/L (ref 128–145)
Total Bilirubin: 0.8 mg/dl (ref 0.20–1.60)
Total Protein: 6.4 g/dL (ref 6.4–8.1)

## 2013-05-14 NOTE — Progress Notes (Signed)
This office note has been dictated.

## 2013-05-15 NOTE — Progress Notes (Signed)
CC:   Verita Schneiders, MD [name] [name], M.D. [name], M.D.  DIAGNOSIS:  Refractory nodular sclerosing Hodgkin's disease.  CURRENT THERAPY:  The patient is status post high-dose chemotherapy and radiation therapy with bone marrow stem cell transplant.  INTERIM HISTORY:  Brian [name] comes back for followup.  He has been at [facility] for the past month or so.  We got him into remission with R-ICE.  He did well with this.  He has lost a little bit of weight.  After he had his stem cells, he did have a temperature for about 10 days.  He got IV antibiotics.  No obvious source for temperature was found.  There has been no bleeding.  He had a lot of diarrhea.  There has been no cough or shortness of breath.  He has had no double vision or blurred vision.  He has had no rashes.  He currently is on acyclovir.  He is also on  Actigall.  I think this is for liver protection.  PHYSICAL EXAMINATION:  General:  This is a well-developed, well- nourished white gentleman who is on the thin side.  Vital signs: Temperature of 98.2, pulse 103, respiratory rate 18, blood pressure 111/75.  Weight is 161.  Head and neck:  Normocephalic, atraumatic skull.  There are no ocular or oral lesions.  There are no palpable cervical or supraclavicular lymph nodes.  Lungs:  Clear bilaterally. Cardiac:  Regular rate and rhythm, with a normal S1 and S2.  There are no murmurs, rubs or bruits.  Abdomen:  Soft with good bowel sounds. There is no palpable abdominal mass.  There is no palpable hepatosplenomegaly.  Extremities:  Show no clubbing, cyanosis or edema. Neurological:  Shows no focal neurological deficits.  Skin:  Shows no rashes, ecchymosis or petechia.  LABORATORY STUDIES:  White cell count is 2.8, hemoglobin 10.1, hematocrit 30.8, platelet count 104,000.  IMPRESSION:  Brian [name] is a 52 year old gentleman with nodular sclerosing Hodgkin's disease.  He underwent stem cell transplant.  He had refractory  disease.  Again, he got into remission with ICE.  He did receive high-dose chemo and radiation therapy.  He is going to be due for a PET scan in August.  This will be done at [facility].  We will continue to follow him weekly with his lab work.  I will probably plan to see him back myself in about 3 weeks.    ______________________________ [name], M.D. PRE/MEDQ  D:  05/14/2013  T:  05/15/2013  Job:  [id number]

## 2013-05-21 ENCOUNTER — Other Ambulatory Visit: Payer: Commercial Managed Care - PPO | Admitting: Lab

## 2013-05-21 ENCOUNTER — Other Ambulatory Visit (HOSPITAL_BASED_OUTPATIENT_CLINIC_OR_DEPARTMENT_OTHER): Payer: Commercial Managed Care - PPO

## 2013-05-21 ENCOUNTER — Ambulatory Visit (HOSPITAL_BASED_OUTPATIENT_CLINIC_OR_DEPARTMENT_OTHER): Payer: Commercial Managed Care - PPO | Admitting: Medical

## 2013-05-21 VITALS — BP 111/66 | HR 93 | Temp 98.2°F | Resp 18 | Ht 70.0 in | Wt 166.0 lb

## 2013-05-21 DIAGNOSIS — E878 Other disorders of electrolyte and fluid balance, not elsewhere classified: Secondary | ICD-10-CM

## 2013-05-21 DIAGNOSIS — Z9481 Bone marrow transplant status: Secondary | ICD-10-CM

## 2013-05-21 DIAGNOSIS — C8195 Hodgkin lymphoma, unspecified, lymph nodes of inguinal region and lower limb: Secondary | ICD-10-CM

## 2013-05-21 LAB — CBC WITH DIFFERENTIAL (CANCER CENTER ONLY)
BASO%: 1.3 % (ref 0.0–2.0)
EOS%: 0.8 % (ref 0.0–7.0)
LYMPH%: 27.2 % (ref 14.0–48.0)
MCH: 32.7 pg (ref 28.0–33.4)
MCHC: 33 g/dL (ref 32.0–35.9)
MCV: 99 fL — ABNORMAL HIGH (ref 82–98)
MONO%: 25.3 % — ABNORMAL HIGH (ref 0.0–13.0)
Platelets: 160 10*3/uL (ref 145–400)
RDW: 17.4 % — ABNORMAL HIGH (ref 11.1–15.7)

## 2013-05-21 LAB — COMPREHENSIVE METABOLIC PANEL
ALT: 19 U/L (ref 0–53)
Alkaline Phosphatase: 81 U/L (ref 39–117)
Glucose, Bld: 63 mg/dL — ABNORMAL LOW (ref 70–99)
Sodium: 140 mEq/L (ref 135–145)
Total Bilirubin: 0.5 mg/dL (ref 0.3–1.2)
Total Protein: 6.1 g/dL (ref 6.0–8.3)

## 2013-05-21 NOTE — Progress Notes (Signed)
DIAGNOSIS:  Refractory resistant nodular sclerosing Hodgkin disease.  PAST THERAPY:  #1. Patient Completed 3 cycles of ICE.  PET scan on 03/05/2013 revealed complete response.  He started his fourth cycle of ICE on 03/25/2013.  He did received G-CSF and underwent pheresis at [facility] on 04/06/2013 with collection of cells. #2.  Patient started  Preparative regimen of TBI, high-dose etoposide, and cyclophosphamide on 04/13/2013.   #3.  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  Patient received daily Neupogen  INTERIM HISTORY: [name] presents today for an office followup visit.  [name] has been down at [facility].  He started his 4th cycle of ICE on 03/25/2013 followed by G-CSF. He underwent pheresis at [facility] on 04/06/2013 with collection. His primary bone marrow transplant physician is Dr. Jacqulyn Bath.  Patient was admitted for preparative regimen of TBI, Cyclophosphamide, and Etoposide.  This was followed by an autologous stem cell transplant on 04/23/2013.  Most part he tolerated it quite well.  He didn't have spiking a fever for about 10 days.  He was monitored at [facility].  Since she's been back he's really not reported any problems.  He does have some fatigue.  He is eating well.  He has a good appetite.  He's not reporting mucositis.  He does not report any odynophagia or dysphagia.  He's not reporting any nausea, vomiting, diarrhea or constipation.  He's not reporting any fevers or chills.  He does have some night sweats.  He's not reporting any chest pain, shortness of breath or cough.  He's not reporting any abdominal pain.  He's not reporting any obvious or abnormal bleeding.  He's not reporting any lower leg swelling.  He denies any headaches, visual changes or rashes.  Review of Systems: Constitutional:Negative for malaise/fatigue, fever, chills, weight loss, diaphoresis, activity change, appetite change, and unexpected weight change.  HEENT: Negative for double vision, blurred vision,  visual loss, ear pain, tinnitus, congestion, rhinorrhea, epistaxis sore throat or sinus disease, oral pain/lesion, tongue soreness Respiratory: Negative for cough, chest tightness, shortness of breath, wheezing and stridor.  Cardiovascular: Negative for chest pain, palpitations, leg swelling, orthopnea, PND, DOE or claudication Gastrointestinal: Negative for nausea, vomiting, abdominal pain, diarrhea, constipation, blood in stool, melena, hematochezia, abdominal distention, anal bleeding, rectal pain, anorexia and hematemesis.  Genitourinary: Negative for dysuria, frequency, hematuria,  Musculoskeletal: Negative for myalgias, back pain, joint swelling, arthralgias and gait problem.  Skin: Negative for rash, color change, pallor and wound.  Neurological:. Negative for dizziness/light-headedness, tremors, seizures, syncope, facial asymmetry, speech difficulty, weakness, numbness, headaches and paresthesias.  Hematological: Negative for adenopathy. Does not bruise/bleed easily.  Psychiatric/Behavioral:  Negative for depression, no loss of interest in normal activity or change in sleep pattern.   Physical Exam: This is a pleasant 52 year old well-developed well-nourished white gentleman in no obvious distress Vitals: Temperature 98.2 degrees pulse 93 respirations 18 blood pressure 111/66 weight 166 pounds a all and he is a HEENT reveals a normocephalic, atraumatic skull, no scleral icterus, no oral lesions  Neck is supple without any cervical or supraclavicular adenopathy.  Lungs are clear to auscultation bilaterally. There are no wheezes, rales or rhonci Cardiac is regular rate and rhythm with a normal S1 and S2. There are no murmurs, rubs, or bruits.  Abdomen is soft with good bowel sounds, there is no palpable mass. There is no palpable hepatosplenomegaly. There is no palpable fluid wave.  Musculoskeletal no tenderness of the spine, ribs, or hips.  Extremities there are no clubbing, cyanosis, or  edema.  Skin no petechia, purpura or ecchymosis Neurologic is nonfocal.  Laboratory Data: White count 3.8 hemoglobin 10.4 hematocrit 31.5 platelets 160,000 ANC 1.7  Current Outpatient Prescriptions on File Prior to Visit  Medication Sig Dispense Refill  . acyclovir (ZOVIRAX) 200 MG capsule 400 mg. Take 400 mg by mouth 2 (two) times daily.      Marland Kitchen LORazepam (ATIVAN) 0.5 MG tablet Take 0.5 mg by mouth as needed.      . magnesium oxide (MAG-OX) 400 MG tablet 400 mg. Take 1 tablet (400 mg total) by mouth daily.      . ursodiol (ACTIGALL) 300 MG capsule 300 mg. Take 1 capsule (300 mg total) by mouth 2 (two) times daily with meals.       Assessment/Plan:this is a pleasant 52 year old white male with the following issues:  #1.  Refractory Hodgkin's lymphoma.  He is status post 3 cycles of ICE with a PET scan on 03/05/2013 which revealed a complete response.  He started his fourth cycle of ICE on 03/25/2013 followed by G-CSF.  He underwent pheresis on 04/06/2013 with collection of cells at [facility].  On 04/13/2013 he started preparative regimen of TBI, high-dose etoposide and cyclophosphamide.  On 04/23/2013 he received autologous stem cell transplant.  He then received Neupogen daily.  He is currently back home and doing quite well.  He's not reporting any latent side effects.  He does have some fatigue, but overall is doing quite well.  He is due for a PET scan at [facility] in August.  #2.  Prophylaxis.  He remains on acyclovir and Ursodiol.  #3.  Follow Up.  [name] will follow back up with [place] next month but before then should there be questions or concerns.

## 2013-05-28 ENCOUNTER — Other Ambulatory Visit (HOSPITAL_BASED_OUTPATIENT_CLINIC_OR_DEPARTMENT_OTHER): Payer: Commercial Managed Care - PPO | Admitting: Lab

## 2013-05-28 ENCOUNTER — Other Ambulatory Visit: Payer: Commercial Managed Care - PPO | Admitting: Lab

## 2013-05-28 ENCOUNTER — Ambulatory Visit (HOSPITAL_BASED_OUTPATIENT_CLINIC_OR_DEPARTMENT_OTHER): Payer: Commercial Managed Care - PPO | Admitting: Hematology & Oncology

## 2013-05-28 VITALS — BP 106/61 | HR 50 | Temp 98.3°F | Resp 18 | Ht 70.0 in | Wt 168.0 lb

## 2013-05-28 DIAGNOSIS — E878 Other disorders of electrolyte and fluid balance, not elsewhere classified: Secondary | ICD-10-CM

## 2013-05-28 DIAGNOSIS — Z9481 Bone marrow transplant status: Secondary | ICD-10-CM

## 2013-05-28 DIAGNOSIS — C8119 Nodular sclerosis classical Hodgkin lymphoma, extranodal and solid organ sites: Secondary | ICD-10-CM

## 2013-05-28 DIAGNOSIS — C8110 Nodular sclerosis classical Hodgkin lymphoma, unspecified site: Secondary | ICD-10-CM

## 2013-05-28 LAB — CBC WITH DIFFERENTIAL (CANCER CENTER ONLY)
BASO#: 0.1 10*3/uL (ref 0.0–0.2)
EOS%: 2.1 % (ref 0.0–7.0)
HCT: 33.8 % — ABNORMAL LOW (ref 38.7–49.9)
HGB: 11.1 g/dL — ABNORMAL LOW (ref 13.0–17.1)
LYMPH#: 1.3 10*3/uL (ref 0.9–3.3)
MCHC: 32.8 g/dL (ref 32.0–35.9)
NEUT%: 47.6 % (ref 40.0–80.0)

## 2013-05-28 LAB — COMPREHENSIVE METABOLIC PANEL
BUN: 15 mg/dL (ref 6–23)
CO2: 27 mEq/L (ref 19–32)
Calcium: 9.2 mg/dL (ref 8.4–10.5)
Chloride: 105 mEq/L (ref 96–112)
Creatinine, Ser: 0.96 mg/dL (ref 0.50–1.35)
Glucose, Bld: 89 mg/dL (ref 70–99)

## 2013-05-28 LAB — MAGNESIUM: Magnesium: 1.8 mg/dL (ref 1.5–2.5)

## 2013-05-28 NOTE — Progress Notes (Signed)
This office note has been dictated.

## 2013-05-29 NOTE — Progress Notes (Signed)
CC:   [name]. [name], M.D. Verita Schneiders, MD  DIAGNOSIS:  Refractory nodular sclerosing Hodgkin disease.  CURRENT THERAPY:  The patient is status post stem cell transplant at [facility].  INTERVAL HISTORY:  [name] comes in for followup.  He is having his lab work done weekly.  We are seeing him back weekly.  He actually has done incredibly well.  He is gaining some more weight on him.  He is doing more activities now.  He is exercising more.  He and his family just got back from the beach.  They had a good time.  He is eating better.  His taste for food is coming back.  He has had no problems with bowels or bladder.  There have been no rashes.  He has had no bleeding or bruising.  He has had no fevers.  PHYSICAL EXAMINATION:  General:  This is a well-developed, well- nourished white gentleman in no obvious distress.  Vital Signs:  Show a temperature of 98.3, pulse 50, respiratory rate 18, blood pressure 106/61.  Weight is 168.  Head and Neck Exam:  Shows a normocephalic, atraumatic skull.  There are no ocular or oral lesions.  There are no palpable cervical or supraclavicular lymph nodes.  Lungs:  Clear bilaterally.  Cardiac Exam:  Regular rate and rhythm with normal S1 and S2.  There are no murmurs, rubs or bruits.  Abdominal Exam:  Soft with good bowel sounds.  There is no palpable abdominal mass.  There is no palpable hepatosplenomegaly.  Back Exam:  No tenderness over the spine, ribs or hips.  Extremities:  Show no clubbing, cyanosis, or edema. Neurological Exam:  Shows no focal neurological deficits.  Skin Exam: No rashes, ecchymosis or petechiae.  LABORATORY STUDIES:  White cell count is 4.7, hemoglobin 11.1, hematocrit 33.8, platelet count 166.  His BUN is 15, creatinine 0.86, sodium 141, potassium 4.1.  Liver function tests are normal.  IMPRESSION:  [name] is a 52 year old gentleman with refractory Hodgkin disease.  We finally got him into remission without salvage therapy  with ICE.  He then underwent TBI and high-dose chemotherapy.  He had autologous stem cell infused on 04/23/2013.  He really did well.  He had few complications post transplant.  I think that we can probably start cutting his visits back to every 2 weeks.  I really think that he is going to be okay now having to come back in 2 weeks.  He is doing better.  He is more active now.  He is gaining weight.  His blood counts are looking better.  He is happy not having to come back so often.  I will plan to see him back in a couple weeks.    ______________________________ [name], M.D. PRE/MEDQ  D:  05/28/2013  T:  05/29/2013  Job:  [id number]

## 2013-06-01 ENCOUNTER — Telehealth: Payer: Self-pay | Admitting: Hematology & Oncology

## 2013-06-01 NOTE — Telephone Encounter (Signed)
Pt called cx all July labs. He is going to [facility] 7-14. Dr. Myna Hidalgo aware pt made 7-28 appointment for lab and MD.

## 2013-06-03 ENCOUNTER — Encounter: Payer: Self-pay | Admitting: *Deleted

## 2013-06-04 ENCOUNTER — Ambulatory Visit: Payer: Commercial Managed Care - PPO | Admitting: Medical

## 2013-06-04 ENCOUNTER — Other Ambulatory Visit: Payer: Commercial Managed Care - PPO | Admitting: Lab

## 2013-06-11 ENCOUNTER — Ambulatory Visit: Payer: Commercial Managed Care - PPO | Admitting: Medical

## 2013-06-11 ENCOUNTER — Other Ambulatory Visit: Payer: Commercial Managed Care - PPO | Admitting: Lab

## 2013-06-15 ENCOUNTER — Telehealth: Payer: Self-pay | Admitting: Hematology & Oncology

## 2013-06-15 NOTE — Telephone Encounter (Signed)
Faxed Medical Records via fax today  to:  [facility] Division of Cellular Therapy [address] [place], [place]  [address] Ph: [phone] Fx: [phone]  Medical  Records requested CBC differentials between 05/08/2013 - 06/08/2013   CONSENT COPY SCANNED

## 2013-06-18 ENCOUNTER — Other Ambulatory Visit: Payer: Commercial Managed Care - PPO | Admitting: Lab

## 2013-06-18 ENCOUNTER — Ambulatory Visit: Payer: Commercial Managed Care - PPO | Admitting: Medical

## 2013-06-22 ENCOUNTER — Ambulatory Visit (HOSPITAL_BASED_OUTPATIENT_CLINIC_OR_DEPARTMENT_OTHER): Payer: Commercial Managed Care - PPO | Admitting: Hematology & Oncology

## 2013-06-22 ENCOUNTER — Other Ambulatory Visit (HOSPITAL_BASED_OUTPATIENT_CLINIC_OR_DEPARTMENT_OTHER): Payer: Commercial Managed Care - PPO | Admitting: Lab

## 2013-06-22 VITALS — BP 111/75 | HR 90 | Temp 98.1°F | Resp 18 | Ht 70.0 in | Wt 172.0 lb

## 2013-06-22 DIAGNOSIS — C8110 Nodular sclerosis classical Hodgkin lymphoma, unspecified site: Secondary | ICD-10-CM

## 2013-06-22 DIAGNOSIS — C8119 Nodular sclerosis classical Hodgkin lymphoma, extranodal and solid organ sites: Secondary | ICD-10-CM

## 2013-06-22 DIAGNOSIS — C8195 Hodgkin lymphoma, unspecified, lymph nodes of inguinal region and lower limb: Secondary | ICD-10-CM

## 2013-06-22 LAB — CBC WITH DIFFERENTIAL (CANCER CENTER ONLY)
BASO#: 0 10*3/uL (ref 0.0–0.2)
EOS%: 3.5 % (ref 0.0–7.0)
HCT: 35 % — ABNORMAL LOW (ref 38.7–49.9)
HGB: 11.8 g/dL — ABNORMAL LOW (ref 13.0–17.1)
LYMPH#: 0.8 10*3/uL — ABNORMAL LOW (ref 0.9–3.3)
LYMPH%: 17 % (ref 14.0–48.0)
MCHC: 33.7 g/dL (ref 32.0–35.9)
MCV: 99 fL — ABNORMAL HIGH (ref 82–98)
NEUT%: 62.9 % (ref 40.0–80.0)

## 2013-06-22 LAB — CMP (CANCER CENTER ONLY)
Albumin: 3.6 g/dL (ref 3.3–5.5)
Alkaline Phosphatase: 62 U/L (ref 26–84)
BUN, Bld: 18 mg/dL (ref 7–22)
Calcium: 9.2 mg/dL (ref 8.0–10.3)
Chloride: 107 mEq/L (ref 98–108)
Creat: 0.6 mg/dl (ref 0.6–1.2)
Glucose, Bld: 98 mg/dL (ref 73–118)
Potassium: 4.1 mEq/L (ref 3.3–4.7)

## 2013-06-22 NOTE — Progress Notes (Signed)
This office note has been dictated.

## 2013-06-23 NOTE — Progress Notes (Signed)
CC:   Brian [name]. Brian [name], M.D. Brian Schneiders, MD  DIAGNOSIS:  Refractory nodular sclerosing Hodgkin's disease.  CURRENT THERAPY:  Status post allogeneic stem cell transplant.  INTERIM HISTORY:  Brian [name] comes in for followup.  He is doing better. He is improving.  His hair is starting to grow back.  He has no complaints, except for arthralgias in the morning.  I think this is from his stem cell transplant conditioning program.  Hopefully, this will improve.  He has had no fever.  He has had no nausea or vomiting.  There has been no change in bowel or bladder habits.  He is not back to work yet.  He goes back to work in about a month.  He goes back to see Dr. Jacqulyn Bath at [facility], I think in a month or so.  PHYSICAL EXAMINATION:  General:  This is a well-developed, well- nourished white gentleman in no obvious distress.  Vital Signs: Temperature 98.1, pulse 90, respiratory rate 18, blood pressure 111/75. Weight is 172.  [name] and Neck:  Shows a normocephalic, atraumatic skull. There are no ocular or oral lesions.  There are no palpable cervical or supraclavicular lymph nodes.  Lungs:  Clear bilaterally.  Cardiac: Regular rate and rhythm with a normal S1, S2.  There are no murmurs, rubs or bruits.  Abdomen:  Soft.  He has good bowel sounds.  There is no fluid wave.  There is no palpable hepatosplenomegaly.  Extremities: Shows no clubbing, cyanosis or edema.  Neurological:  Shows no focal neurological deficits.  LABORATORY STUDIES:  White cell count is 4.9, hemoglobin 11.8, hematocrit 35, and platelet count 162.  IMPRESSION:  Brian [name] is a 52 year old gentleman with refractory nodular sclerosing Hodgkin's disease.  He got into remission with salvage chemotherapy and then went to transplant.  He had his transplant on 04/23/2013.  He did have total body radiation therapy along with high- dose chemo.  PLAN:  Again, he is improving daily.  He is looking real good.  I do not see a problem with him  going back to work in a month.  We will continue to check his blood counts every 2 weeks.  I will see him back in 1 month.    ______________________________ [name], M.D. PRE/MEDQ  D:  06/22/2013  T:  06/23/2013  Job:  [id number]

## 2013-06-25 ENCOUNTER — Other Ambulatory Visit: Payer: Commercial Managed Care - PPO | Admitting: Lab

## 2013-06-25 ENCOUNTER — Ambulatory Visit: Payer: Commercial Managed Care - PPO | Admitting: Medical

## 2013-07-02 ENCOUNTER — Other Ambulatory Visit: Payer: Commercial Managed Care - PPO | Admitting: Lab

## 2013-07-02 ENCOUNTER — Ambulatory Visit: Payer: Commercial Managed Care - PPO | Admitting: Medical

## 2013-07-08 ENCOUNTER — Other Ambulatory Visit: Payer: Self-pay | Admitting: *Deleted

## 2013-07-08 ENCOUNTER — Other Ambulatory Visit (HOSPITAL_BASED_OUTPATIENT_CLINIC_OR_DEPARTMENT_OTHER): Payer: Commercial Managed Care - PPO | Admitting: Lab

## 2013-07-08 ENCOUNTER — Telehealth: Payer: Self-pay | Admitting: *Deleted

## 2013-07-08 DIAGNOSIS — C8110 Nodular sclerosis classical Hodgkin lymphoma, unspecified site: Secondary | ICD-10-CM

## 2013-07-08 DIAGNOSIS — R52 Pain, unspecified: Secondary | ICD-10-CM

## 2013-07-08 DIAGNOSIS — C8119 Nodular sclerosis classical Hodgkin lymphoma, extranodal and solid organ sites: Secondary | ICD-10-CM

## 2013-07-08 DIAGNOSIS — C8195 Hodgkin lymphoma, unspecified, lymph nodes of inguinal region and lower limb: Secondary | ICD-10-CM

## 2013-07-08 LAB — CMP (CANCER CENTER ONLY)
Albumin: 3.7 g/dL (ref 3.3–5.5)
Alkaline Phosphatase: 62 U/L (ref 26–84)
BUN, Bld: 20 mg/dL (ref 7–22)
Creat: 1 mg/dl (ref 0.6–1.2)
Glucose, Bld: 82 mg/dL (ref 73–118)
Potassium: 4.2 mEq/L (ref 3.3–4.7)

## 2013-07-08 LAB — CBC WITH DIFFERENTIAL (CANCER CENTER ONLY)
BASO#: 0 10*3/uL (ref 0.0–0.2)
EOS%: 3.4 % (ref 0.0–7.0)
Eosinophils Absolute: 0.2 10*3/uL (ref 0.0–0.5)
HCT: 35.4 % — ABNORMAL LOW (ref 38.7–49.9)
HGB: 12 g/dL — ABNORMAL LOW (ref 13.0–17.1)
LYMPH%: 18.2 % (ref 14.0–48.0)
MCH: 33.4 pg (ref 28.0–33.4)
MCHC: 33.9 g/dL (ref 32.0–35.9)
MCV: 99 fL — ABNORMAL HIGH (ref 82–98)
MONO%: 16.9 % — ABNORMAL HIGH (ref 0.0–13.0)
NEUT%: 61.1 % (ref 40.0–80.0)
RBC: 3.59 10*6/uL — ABNORMAL LOW (ref 4.20–5.70)

## 2013-07-08 NOTE — Telephone Encounter (Signed)
Message copied by Wynonia Hazard on Wed Jul 08, 2013  4:38 PM ------      Message from: Arlan Organ R      Created: Wed Jul 08, 2013 11:12 AM       Call - labs look good!!  Cindee Lame ------

## 2013-07-09 ENCOUNTER — Telehealth: Payer: Self-pay | Admitting: Hematology & Oncology

## 2013-07-09 ENCOUNTER — Other Ambulatory Visit (HOSPITAL_COMMUNITY): Payer: Self-pay | Admitting: Hematology & Oncology

## 2013-07-09 DIAGNOSIS — M7989 Other specified soft tissue disorders: Secondary | ICD-10-CM

## 2013-07-09 DIAGNOSIS — M25562 Pain in left knee: Secondary | ICD-10-CM

## 2013-07-09 NOTE — Telephone Encounter (Signed)
Pt aware of 07-10-13 [place] at [facility]

## 2013-07-10 ENCOUNTER — Encounter (HOSPITAL_COMMUNITY): Payer: Commercial Managed Care - PPO

## 2013-07-21 ENCOUNTER — Telehealth: Payer: Self-pay

## 2013-07-21 NOTE — Telephone Encounter (Signed)
Dr. [name],  Pt. Called and scheduled an appt. For a CPE Nov. 25th. He would like to know if you can possibly work him in sooner.  Please let us know. Thanks!  Pt's number [phone]

## 2013-07-22 ENCOUNTER — Other Ambulatory Visit: Payer: Commercial Managed Care - PPO | Admitting: Lab

## 2013-07-22 ENCOUNTER — Ambulatory Visit (HOSPITAL_BASED_OUTPATIENT_CLINIC_OR_DEPARTMENT_OTHER): Payer: Commercial Managed Care - PPO | Admitting: Hematology & Oncology

## 2013-07-22 VITALS — BP 124/71 | HR 87 | Temp 98.7°F | Resp 18 | Ht 70.0 in | Wt 174.0 lb

## 2013-07-22 DIAGNOSIS — C8110 Nodular sclerosis classical Hodgkin lymphoma, unspecified site: Secondary | ICD-10-CM

## 2013-07-22 DIAGNOSIS — C8119 Nodular sclerosis classical Hodgkin lymphoma, extranodal and solid organ sites: Secondary | ICD-10-CM

## 2013-07-22 NOTE — Progress Notes (Signed)
This office note has been dictated.

## 2013-07-23 ENCOUNTER — Telehealth: Payer: Self-pay

## 2013-07-23 NOTE — Progress Notes (Signed)
CC:   Brian Brian [name]. Brian Brian [name], M.D.  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  CURRENT THERAPY:  Status post autologous stem cell transplant at [facility].  INTERIM HISTORY:  Brian Brian [name] comes in for followup.  He went to [facility] yesterday.  He had a PET scan done there.  Thankfully, this showed that he was in remission.  The PET scan was done yesterday.  The PET scan did not show any physiologic uptake. He does not need another scan for a year.  He feels better.  He is trying to work out more.  He thinks he will be able to do more starting in a week or so.  He is working.  He is back to work full-time.  His appetite is good.  Today is his birthday.  He incidentally went out yesterday.  They had a good time.  He has had no problems with cough or shortness of breath.  He has had no nausea or vomiting.  There has been no change in bowel or bladder habits.  He continues on acyclovir.  He will be on this for a year.  PHYSICAL EXAMINATION:  General:  This is a well-developed, well- nourished white gentleman in no obvious distress.  Vital signs: Temperature of 98.4, pulse 87, respiratory rate 18, blood pressure 124/71.  Weight is 174.  Brian [name] and neck:  Normocephalic, atraumatic skull.  There are no ocular or oral lesions.  There are no palpable cervical or supraclavicular lymph nodes.  Lungs:  Clear bilaterally. Cardiac:  Regular rate and rhythm with a normal S1, S2.  There are no murmurs, rubs or bruits.  Abdomen:  Soft.  He has good bowel sounds. There is no fluid wave.  There is no palpable hepatosplenomegaly.  Back: No tenderness over the spine, ribs, or hips.  Extremities:  Show no clubbing, cyanosis or edema.  Skin:  No rashes, ecchymosis, or petechia.  LABORATORY STUDIES:  White cell count 4.5, hemoglobin 12, hematocrit 35.4, platelet count 171.  BUN is 20, creatinine 1.0.  LFTs are normal.  IMPRESSION:  Brian Brian [name] is very nice  52 year old gentleman with refractory nodular sclerosing Hodgkin disease.  We got him into remission with salvage chemotherapy with ICE.  He then went on to [facility] for an autologous stem cell transplant.  Again, he is in remission right now.  Hopefully, he will stay in remission.  We will continue to follow him monthly with lab work.  I do not see a need for any additional labs or x-rays unless he has symptoms or lab work is abnormal.    ______________________________ [name], M.D. PRE/MEDQ  D:  07/22/2013  T:  07/23/2013  Job:  [id number]

## 2013-07-23 NOTE — Telephone Encounter (Signed)
Please advise pended 

## 2013-07-23 NOTE — Telephone Encounter (Signed)
Patient states that [facility] on [address] has sent over a request for Xanax. Patient calling to check the status.  [phone].

## 2013-07-24 NOTE — Telephone Encounter (Signed)
Please asked Brian [name] to send me a copy of the open clinic for September. Please call Brian [name] and tell him once I see when an open clinic can be in September I will give him in for a physical.

## 2013-07-24 NOTE — Telephone Encounter (Signed)
Refilled patient's Xanax 0.5 as needed for stress #30 tablets 2 refills

## 2013-07-24 NOTE — Telephone Encounter (Signed)
We have scheduled Brian [name] for 9/29. He wanted you to know he had a clean pet scan last week and is looking forward to seeing you.

## 2013-07-27 MED ORDER — ALPRAZOLAM 0.5 MG PO TABS: 0.5000 mg | ORAL_TABLET | Freq: Every evening | ORAL | Status: DC | PRN

## 2013-07-27 NOTE — Telephone Encounter (Signed)
Called pt who stated that he gets his alprazolam at [facility], not at [facility] and that he takes the 0.5 mg as Dr [name] had written (there was also a Rx for the 0.25 mg in the system). Also checked with [facility] and they verified that pt has been taking the 0.5 mg. Called in RFs.

## 2013-07-28 ENCOUNTER — Encounter: Payer: Self-pay | Admitting: Hematology & Oncology

## 2013-07-28 NOTE — Progress Notes (Signed)
I just spoke w Dr. Illa Level nurse (Amy) and she advice, at this time he is off Neulasta, unless a recurrence. See notes per Dr. Gustavo Lah nurse. I will get card deactivated.

## 2013-08-19 ENCOUNTER — Ambulatory Visit (HOSPITAL_BASED_OUTPATIENT_CLINIC_OR_DEPARTMENT_OTHER): Payer: Commercial Managed Care - PPO | Admitting: Hematology & Oncology

## 2013-08-19 ENCOUNTER — Other Ambulatory Visit (HOSPITAL_BASED_OUTPATIENT_CLINIC_OR_DEPARTMENT_OTHER): Payer: Commercial Managed Care - PPO | Admitting: Lab

## 2013-08-19 VITALS — BP 114/69 | HR 82 | Temp 98.3°F | Resp 16 | Ht 70.0 in | Wt 176.0 lb

## 2013-08-19 DIAGNOSIS — C8195 Hodgkin lymphoma, unspecified, lymph nodes of inguinal region and lower limb: Secondary | ICD-10-CM

## 2013-08-19 DIAGNOSIS — C8110 Nodular sclerosis classical Hodgkin lymphoma, unspecified site: Secondary | ICD-10-CM

## 2013-08-19 DIAGNOSIS — C8119 Nodular sclerosis classical Hodgkin lymphoma, extranodal and solid organ sites: Secondary | ICD-10-CM

## 2013-08-19 LAB — COMPREHENSIVE METABOLIC PANEL
ALT: 26 U/L (ref 0–53)
AST: 20 U/L (ref 0–37)
Alkaline Phosphatase: 64 U/L (ref 39–117)
Sodium: 139 mEq/L (ref 135–145)
Total Bilirubin: 0.4 mg/dL (ref 0.3–1.2)
Total Protein: 6 g/dL (ref 6.0–8.3)

## 2013-08-19 LAB — CBC WITH DIFFERENTIAL (CANCER CENTER ONLY)
BASO%: 0.5 % (ref 0.0–2.0)
LYMPH%: 13.3 % — ABNORMAL LOW (ref 14.0–48.0)
MCH: 32.6 pg (ref 28.0–33.4)
MCV: 96 fL (ref 82–98)
MONO%: 9.6 % (ref 0.0–13.0)
Platelets: 169 10*3/uL (ref 145–400)
RDW: 12.9 % (ref 11.1–15.7)
WBC: 4.2 10*3/uL (ref 4.0–10.0)

## 2013-08-19 NOTE — Progress Notes (Signed)
This office note has been dictated.

## 2013-08-21 ENCOUNTER — Encounter: Payer: Self-pay | Admitting: *Deleted

## 2013-08-21 ENCOUNTER — Encounter: Payer: Self-pay | Admitting: Hematology & Oncology

## 2013-08-24 ENCOUNTER — Ambulatory Visit (INDEPENDENT_AMBULATORY_CARE_PROVIDER_SITE_OTHER): Payer: Commercial Managed Care - PPO | Admitting: Emergency Medicine

## 2013-08-24 ENCOUNTER — Encounter: Payer: Self-pay | Admitting: Emergency Medicine

## 2013-08-24 VITALS — BP 110/80 | HR 77 | Temp 98.0°F | Resp 16 | Ht 70.0 in | Wt 170.8 lb

## 2013-08-24 DIAGNOSIS — Z0000 Encounter for general adult medical examination without abnormal findings: Secondary | ICD-10-CM

## 2013-08-24 DIAGNOSIS — Z23 Encounter for immunization: Secondary | ICD-10-CM

## 2013-08-24 DIAGNOSIS — D229 Melanocytic nevi, unspecified: Secondary | ICD-10-CM

## 2013-08-24 LAB — COMPREHENSIVE METABOLIC PANEL
AST: 25 U/L (ref 0–37)
Albumin: 4.8 g/dL (ref 3.5–5.2)
Alkaline Phosphatase: 75 U/L (ref 39–117)
Calcium: 10 mg/dL (ref 8.4–10.5)
Creat: 1.01 mg/dL (ref 0.50–1.35)
Glucose, Bld: 97 mg/dL (ref 70–99)
Potassium: 4.4 mEq/L (ref 3.5–5.3)
Sodium: 140 mEq/L (ref 135–145)
Total Bilirubin: 0.8 mg/dL (ref 0.3–1.2)
Total Protein: 6.8 g/dL (ref 6.0–8.3)

## 2013-08-24 LAB — CBC WITH DIFFERENTIAL/PLATELET
Basophils Absolute: 0 10*3/uL (ref 0.0–0.1)
Basophils Relative: 1 % (ref 0–1)
Eosinophils Absolute: 0.2 10*3/uL (ref 0.0–0.7)
Hemoglobin: 13.8 g/dL (ref 13.0–17.0)
MCH: 32.2 pg (ref 26.0–34.0)
MCHC: 34.9 g/dL (ref 30.0–36.0)
Neutro Abs: 2.5 10*3/uL (ref 1.7–7.7)
Neutrophils Relative %: 64 % (ref 43–77)
Platelets: 222 10*3/uL (ref 150–400)
RDW: 14.3 % (ref 11.5–15.5)
WBC: 3.9 10*3/uL — ABNORMAL LOW (ref 4.0–10.5)

## 2013-08-24 LAB — POCT URINALYSIS DIPSTICK
Glucose, UA: NEGATIVE
Ketones, UA: NEGATIVE
Leukocytes, UA: NEGATIVE
Nitrite, UA: NEGATIVE
Protein, UA: NEGATIVE
Urobilinogen, UA: 0.2
pH, UA: 5.5

## 2013-08-24 LAB — LIPID PANEL
Cholesterol: 208 mg/dL — ABNORMAL HIGH (ref 0–200)
LDL Cholesterol: 143 mg/dL — ABNORMAL HIGH (ref 0–99)
Total CHOL/HDL Ratio: 4.4 Ratio
Triglycerides: 90 mg/dL (ref <150)
VLDL: 18 mg/dL (ref 0–40)

## 2013-08-24 LAB — IFOBT (OCCULT BLOOD): IFOBT: NEGATIVE

## 2013-08-24 NOTE — Progress Notes (Signed)
  Subjective:    Patient ID: Brian [name], male    DOB: 06/18/1961, 52 y.o.   MRN: [id number]  HPI    Review of Systems  Constitutional: Negative.   HENT: Positive for tinnitus.   Eyes: Negative.   Respiratory: Negative.   Cardiovascular: Negative.   Gastrointestinal: Negative.   Endocrine: Negative.   Genitourinary: Negative.   Allergic/Immunologic: Negative.   Neurological: Negative.   Hematological: Negative.   Psychiatric/Behavioral: Negative.        Objective:   Physical Exam the patient has a mild right facial paralysis the HEENT exam TMs are clear nose is normal throat clear. Neck is supple. Chest is clear to both auscultation and percussion. Heart regular rate no murmurs rubs or gallops. Abdomen soft liver and spleen non-large no areas of tenderness. GU reveals normal male testicles descended no hernia felt. Rectal exam shows a normal prostate extremity exam reveals no cyanosis clubbing or edema        Assessment & Plan:  Patient looks great status post bone marrow transplant with his own stem cells. This was performed at [facility]. He currently is under the care of Dr. Casimer Lanius and is checked each month he is scheduled to see the radiation oncologist for a checkup in November. He is up-to-date on his colonoscopy. He has checked with his his physician at [facility] and has been cleared to obtain a flu shot .

## 2013-08-24 NOTE — Telephone Encounter (Signed)
Pt called to ask if his young kids could get the Flu Mist.  Said he was told they could not get it because it was a live virus and he was taking chemotherapy.  Asking if this is so.  Per Dr. Myna Hidalgo, this is true, they will need to get the injection.  Left this message on pt's answering machine.

## 2013-08-25 NOTE — Progress Notes (Signed)
CC:   [name]. [name], M.D. Verita Schneiders, MD  DIAGNOSIS:  Refractory nodular sclerosing Hodgkin disease -- status post autologous bone marrow transplant in May 2014.  INTERIM HISTORY:  Mr. Camps comes in for followup.  He continues to look great.  His hair has come back very nicely.  He is working.  He still does get fatigued; however, he seems to be gaining more strength and stamina.  He is trying to work out a little bit more.  He has had no problems with fever.  He has had no problems with bowels or bladder.  There have been no mouth sores.  He has had no rashes.  He has had no swelling in his legs.  Overall, his performance status is ECOG 0.  PHYSICAL EXAMINATION:  General:  This is a well-developed, well- nourished white gentleman in no obvious distress.  Vital signs: Temperature of 98.3, pulse 82, respiratory rate 16, blood pressure 114/69.  Weight is 176.  Head and neck:  Normocephalic, atraumatic skull.  There are no ocular or oral lesions.  There are no palpable cervical or supraclavicular lymph nodes.  Lungs:  Clear to percussion and auscultation bilaterally.  Cardiac:  Regular rate and rhythm with a normal S1 and S2.  There are no murmurs, rubs, or bruits.  Abdomen: Soft.  He has good bowel sounds.  There is no palpable abdominal mass. There is no palpable hepatosplenomegaly.  Extremities:  No clubbing, cyanosis, or edema.  Neurological:  No focal neurological deficits.  LABORATORY STUDIES:  White cell count is 4.2, hemoglobin 12.2, hematocrit 36, platelet count 169.  Peripheral smear shows good maturation of his red blood cells and white blood cells and platelets.  I see no nucleated red blood cells.  There are no atypical lymphocytes.  There are no immature myeloid forms. There are no blasts.  He has no nucleated red blood cells.  There are no schistocytes or spherocytes.  IMPRESSION:  Mr. Dentler is a 52 year old gentleman with refractory nodular sclerosing Hodgkin  disease.  He ultimately underwent salvage therapy with ifosfamide-carboplatin-etoposide.  He subsequently underwent an autologous transplant at [facility].  He got pre transplant chemo and radiation therapy.  I will plan to see him back in 6 weeks.  I think we can just go to monthly labs for right now.   ______________________________ [name], M.D. PRE/MEDQ  D:  08/19/2013  T:  08/25/2013  Job:  [id number]

## 2013-09-01 ENCOUNTER — Encounter: Payer: Self-pay | Admitting: Emergency Medicine

## 2013-09-16 ENCOUNTER — Other Ambulatory Visit (HOSPITAL_BASED_OUTPATIENT_CLINIC_OR_DEPARTMENT_OTHER): Payer: Commercial Managed Care - PPO | Admitting: Lab

## 2013-09-16 ENCOUNTER — Other Ambulatory Visit: Payer: Commercial Managed Care - PPO | Admitting: Lab

## 2013-09-16 DIAGNOSIS — C8195 Hodgkin lymphoma, unspecified, lymph nodes of inguinal region and lower limb: Secondary | ICD-10-CM

## 2013-09-16 LAB — COMPREHENSIVE METABOLIC PANEL
ALT: 26 U/L (ref 0–53)
AST: 24 U/L (ref 0–37)
CO2: 29 mEq/L (ref 19–32)
Calcium: 9.5 mg/dL (ref 8.4–10.5)
Chloride: 106 mEq/L (ref 96–112)
Creatinine, Ser: 1.08 mg/dL (ref 0.50–1.35)
Potassium: 4.8 mEq/L (ref 3.5–5.3)
Sodium: 141 mEq/L (ref 135–145)
Total Bilirubin: 0.6 mg/dL (ref 0.3–1.2)
Total Protein: 6.2 g/dL (ref 6.0–8.3)

## 2013-09-16 LAB — CBC WITH DIFFERENTIAL (CANCER CENTER ONLY)
BASO#: 0 10*3/uL (ref 0.0–0.2)
BASO%: 0.3 % (ref 0.0–2.0)
EOS%: 3.7 % (ref 0.0–7.0)
Eosinophils Absolute: 0.1 10*3/uL (ref 0.0–0.5)
HGB: 12.7 g/dL — ABNORMAL LOW (ref 13.0–17.1)
LYMPH#: 0.6 10*3/uL — ABNORMAL LOW (ref 0.9–3.3)
MCH: 32.6 pg (ref 28.0–33.4)
MONO%: 15.7 % — ABNORMAL HIGH (ref 0.0–13.0)
NEUT#: 2.2 10*3/uL (ref 1.5–6.5)
Platelets: 175 10*3/uL (ref 145–400)
RBC: 3.9 10*6/uL — ABNORMAL LOW (ref 4.20–5.70)

## 2013-09-16 LAB — MAGNESIUM: Magnesium: 1.7 mg/dL (ref 1.5–2.5)

## 2013-09-16 LAB — LACTATE DEHYDROGENASE: LDH: 142 U/L (ref 94–250)

## 2013-09-17 ENCOUNTER — Telehealth: Payer: Self-pay | Admitting: Nurse Practitioner

## 2013-09-17 NOTE — Telephone Encounter (Addendum)
Message copied by Glee Arvin on Thu Sep 17, 2013 12:35 PM ------      Message from: Arlan Organ R      Created: Thu Sep 17, 2013  9:26 AM       Call -labs are ok!!  Brian [name] ------Spoke with pt and he verbalized understanding and appreciation.

## 2013-09-30 ENCOUNTER — Other Ambulatory Visit: Payer: Commercial Managed Care - PPO | Admitting: Lab

## 2013-09-30 ENCOUNTER — Ambulatory Visit: Payer: Commercial Managed Care - PPO | Admitting: Hematology & Oncology

## 2013-10-09 ENCOUNTER — Telehealth: Payer: Self-pay | Admitting: *Deleted

## 2013-10-09 NOTE — Telephone Encounter (Signed)
Patient called stating he has some nonspecific discomfort (.5 on a 10 scale, not even bad)  It is a burning, tender pain.  Patient concerned that it could be an ulcer due to stress at work.  Shared with Dr. Myna Hidalgo.  Dr. Myna Hidalgo states it is ok to wait until next week to see patient

## 2013-10-14 ENCOUNTER — Ambulatory Visit (HOSPITAL_BASED_OUTPATIENT_CLINIC_OR_DEPARTMENT_OTHER): Payer: Commercial Managed Care - PPO | Admitting: Hematology & Oncology

## 2013-10-14 VITALS — BP 144/79 | HR 78 | Temp 98.1°F | Resp 18 | Ht 70.0 in | Wt 174.0 lb

## 2013-10-14 DIAGNOSIS — R079 Chest pain, unspecified: Secondary | ICD-10-CM

## 2013-10-14 DIAGNOSIS — C8195 Hodgkin lymphoma, unspecified, lymph nodes of inguinal region and lower limb: Secondary | ICD-10-CM

## 2013-10-14 DIAGNOSIS — C8119 Nodular sclerosis classical Hodgkin lymphoma, extranodal and solid organ sites: Secondary | ICD-10-CM

## 2013-10-14 DIAGNOSIS — F430 Acute stress reaction: Secondary | ICD-10-CM

## 2013-10-14 DIAGNOSIS — K3189 Other diseases of stomach and duodenum: Secondary | ICD-10-CM

## 2013-10-14 NOTE — Progress Notes (Signed)
This office note has been dictated.

## 2013-10-15 ENCOUNTER — Telehealth: Payer: Self-pay | Admitting: Hematology & Oncology

## 2013-10-15 NOTE — Telephone Encounter (Signed)
Pt called moved 12-24 to 1-22 he said [facility] told him he only needed to come every other month now. Amy RN aware

## 2013-10-17 NOTE — Progress Notes (Signed)
CC:   Brian [name]. [name], M.D.  DIAGNOSIS:  Refractory nodular sclerosing Hodgkin disease.  CURRENT THERAPY:  The patient is status post autologous bone marrow transplant in May of 2014.  INTERIM HISTORY:  Brian [name] comes in for followup.  He is doing okay. His main complaint has been some dyspepsia.  He has also had some discomfort in the left lower rib cage.  He is exercising.  He is not short of breath.  He is working.  He is having no problems with his appetite.  There has been no cough.  He has had no fever.  He has been under some stress.  This may be some of the dyspepsia.  He will see his transplant doctors tomorrow at [facility]  I am sure he will talk to them about this.  He has had no bleeding.  He has had no rashes.  He has not noticed any dysphagia or odynophagia.  He has not noticed any swollen lymph glands.  Overall, his performance status is ECOG 0.  PHYSICAL EXAMINATION:  General:  This is a well-developed, well- nourished white gentleman, in no obvious distress.  Vital Signs: Temperature of 98.4, pulse 78, respiratory rate 18, blood pressure 144/79, weight is 174 pounds.  [name] and Neck:  Normocephalic, atraumatic skull.  He has no ocular or oral lesions.  There are no palpable cervical or supraclavicular lymph nodes.  Lungs:  Clear to percussion and auscultation bilaterally.  Cardiac:  Regular rate and rhythm with a normal S1 and S2.  There are no murmurs, rubs, or bruits.  Abdomen: Soft.  He has good bowel sounds.  There is some slight tenderness to palpation in the epigastric region.  He has no guarding.  He has no palpable abdominal mass.  There is no fluid wave.  There is no palpable hepatosplenomegaly.  Back:  No tenderness over the spine, ribs, or hips. Over the left lower rib cage, I cannot palpate any swelling or tenderness.  There was no erythema.  Extremities:  No clubbing, cyanosis, or edema.  Neurological:  No focal neurological deficits.  LABORATORY STUDIES:   Not done on this visit.  IMPRESSION:  Brian [name] is a really nice, 52 year old gentleman with refractory nodular sclerosing Hodgkin disease.  We got him into remission with salvage therapy.  He then underwent autologous transplant at [facility] in May of 2014.  I am not sure what this discomfort is that he has over on the left lower rib cage.  This seems to be very focal in nature.  I told that it is possible he may need to have an upper endoscopy if his GI symptoms continue.  I suppose that he may have a Helicobacter infection.  Again, I am sure he will talk to the transplant doctors regarding this tomorrow.  From my point of view, I do not see that we have to do any x-rays on him.  I think he is supposed to get a PET scan at [facility] yearly.  I do not think we need to get lab work on him monthly at this point.  I will plan and want to get him back to be seen in about a month or so.  Again, I told him to try some over-the-counter Pepcid and take this twice a day.    ______________________________ [name], M.D. PRE/MEDQ  D:  10/14/2013  T:  10/17/2013  Job:  [id number]

## 2013-10-20 ENCOUNTER — Encounter: Payer: Commercial Managed Care - PPO | Admitting: Emergency Medicine

## 2013-11-02 NOTE — Progress Notes (Signed)
CC:   Brian [name]. Brian [name], M.D. Brian Schneiders, MD  DIAGNOSIS:  Refractory nodular sclerosing Hodgkin disease -- status post autologous bone marrow transplant in May 2014.  INTERIM HISTORY:  Brian [name] comes in for followup.  He continues to look great.  His hair has come back very nicely.  He is working.  He still does get fatigued.  However, he seems to be gaining more strength and stamina.  He is trying to work out a little bit more.  He has had no problems with fever.  He has had no problems with bowels or bladder.  There have been no mouth sores.  He has had no rashes.  He has had no swelling in his legs.  Overall, his performance status is ECOG 0.  PHYSICAL EXAMINATION:  General:  This is a well-developed, well- nourished white gentleman in no obvious distress.  Vital Signs: Temperature of 98.3, pulse 82, respiratory rate 16, blood pressure 114/69, weight is 176.  [name] and Neck:  Normocephalic, atraumatic skull. There are no ocular or oral lesions.  There are no palpable cervical or supraclavicular lymph nodes.  Lungs:  Clear to percussion and auscultation bilaterally.  Cardiac:  Regular rate and rhythm with a normal S1 and S2.  There are no murmurs, rubs or bruits.  Abdomen: Soft.  He has good bowel sounds.  There is no palpable abdominal mass. There is no palpable hepatosplenomegaly.  Extremities:  Show no clubbing, cyanosis or edema.  Neurologic:  Shows no focal neurological deficits.  LABORATORY STUDIES:  White cell count is 4.2, hemoglobin 12.2, hematocrit 36, platelet count 169.  Peripheral smear shows good maturation of his red blood cells and white blood cells and platelets.  I see no nucleated red blood cells.  There are no atypical lymphocytes.  There are no immature myeloid forms. There are no blasts.  He has no nucleated red blood cells.  There are no schistocytes or spherocytes.  IMPRESSION:  Brian [name] is a 52 year old gentleman with refractory nodular sclerosing Hodgkin  disease.  He ultimately underwent salvage therapy with ifosfamide-carboplatin-etoposide.  He subsequently underwent an autologous transplant.  He got pretransplant chemo and radiation therapy.  I will plan to see him back in 6 weeks.  I think we can just go to monthly labs for right now.   ______________________________ Brian [name], M.D. PRE/MEDQ  D:  08/19/2013  T:  11/01/2013  Job:  [id number]

## 2013-11-18 ENCOUNTER — Ambulatory Visit: Payer: Commercial Managed Care - PPO | Admitting: Hematology & Oncology

## 2013-11-18 ENCOUNTER — Other Ambulatory Visit: Payer: Commercial Managed Care - PPO | Admitting: Lab

## 2013-12-17 ENCOUNTER — Encounter: Payer: Self-pay | Admitting: Hematology & Oncology

## 2013-12-17 ENCOUNTER — Other Ambulatory Visit (HOSPITAL_BASED_OUTPATIENT_CLINIC_OR_DEPARTMENT_OTHER): Payer: Commercial Managed Care - PPO | Admitting: Lab

## 2013-12-17 ENCOUNTER — Ambulatory Visit (HOSPITAL_BASED_OUTPATIENT_CLINIC_OR_DEPARTMENT_OTHER): Payer: Commercial Managed Care - PPO | Admitting: Hematology & Oncology

## 2013-12-17 VITALS — BP 119/59 | HR 67 | Temp 98.0°F | Resp 18 | Ht 70.0 in | Wt 174.0 lb

## 2013-12-17 DIAGNOSIS — C8195 Hodgkin lymphoma, unspecified, lymph nodes of inguinal region and lower limb: Secondary | ICD-10-CM

## 2013-12-17 DIAGNOSIS — C8119 Nodular sclerosis classical Hodgkin lymphoma, extranodal and solid organ sites: Secondary | ICD-10-CM

## 2013-12-17 DIAGNOSIS — K279 Peptic ulcer, site unspecified, unspecified as acute or chronic, without hemorrhage or perforation: Secondary | ICD-10-CM

## 2013-12-17 DIAGNOSIS — R1013 Epigastric pain: Secondary | ICD-10-CM

## 2013-12-17 LAB — CBC WITH DIFFERENTIAL (CANCER CENTER ONLY)
BASO#: 0 10*3/uL (ref 0.0–0.2)
BASO%: 0.6 % (ref 0.0–2.0)
EOS ABS: 0.2 10*3/uL (ref 0.0–0.5)
EOS%: 3.4 % (ref 0.0–7.0)
HCT: 38.5 % — ABNORMAL LOW (ref 38.7–49.9)
HEMOGLOBIN: 12.9 g/dL — AB (ref 13.0–17.1)
LYMPH#: 0.8 10*3/uL — ABNORMAL LOW (ref 0.9–3.3)
LYMPH%: 15.2 % (ref 14.0–48.0)
MCH: 33 pg (ref 28.0–33.4)
MCHC: 33.5 g/dL (ref 32.0–35.9)
MCV: 99 fL — AB (ref 82–98)
MONO#: 0.8 10*3/uL (ref 0.1–0.9)
MONO%: 14.6 % — AB (ref 0.0–13.0)
NEUT#: 3.5 10*3/uL (ref 1.5–6.5)
NEUT%: 66.2 % (ref 40.0–80.0)
Platelets: 186 10*3/uL (ref 145–400)
RBC: 3.91 10*6/uL — AB (ref 4.20–5.70)
RDW: 12.6 % (ref 11.1–15.7)
WBC: 5.3 10*3/uL (ref 4.0–10.0)

## 2013-12-17 LAB — CMP (CANCER CENTER ONLY)
ALBUMIN: 4 g/dL (ref 3.3–5.5)
ALT: 29 U/L (ref 10–47)
AST: 26 U/L (ref 11–38)
Alkaline Phosphatase: 72 U/L (ref 26–84)
BUN, Bld: 18 mg/dL (ref 7–22)
CO2: 32 meq/L (ref 18–33)
CREATININE: 1.1 mg/dL (ref 0.6–1.2)
Calcium: 9.1 mg/dL (ref 8.0–10.3)
Chloride: 103 mEq/L (ref 98–108)
Glucose, Bld: 79 mg/dL (ref 73–118)
POTASSIUM: 4.1 meq/L (ref 3.3–4.7)
Sodium: 141 mEq/L (ref 128–145)
Total Bilirubin: 0.9 mg/dl (ref 0.20–1.60)
Total Protein: 6.8 g/dL (ref 6.4–8.1)

## 2013-12-17 LAB — MAGNESIUM: Magnesium: 1.7 mg/dL (ref 1.5–2.5)

## 2013-12-17 LAB — LACTATE DEHYDROGENASE: LDH: 142 U/L (ref 94–250)

## 2013-12-17 MED ORDER — AMOXICILL-CLARITHRO-LANSOPRAZ PO MISC: Freq: Two times a day (BID) | ORAL | Status: DC

## 2013-12-17 NOTE — Progress Notes (Signed)
This office note has been dictated.

## 2013-12-18 NOTE — Progress Notes (Signed)
CC:   Brian [name]. Brian [name], M.D. Brian Latina, MD  DIAGNOSIS:  Refractory nodular sclerosing Hodgkin disease.  CURRENT THERAPY:  Status post autologous stem cell transplant at [facility] in May of 2014.  INTERIM HISTORY:  Brian [name] comes in for his followup.  He is doing pretty well.  He still has his abdominal discomfort.  He has been on antacids and proton pump inhibitors.  He has not had an upper endoscopy.  I will try empirically on Helicobacter therapy.  Otherwise, he is doing well.  He is working.  He is exercising more.  He has had no cough or shortness of breath.  He has had no problem with bowels or bladder.  He has had no rashes.  He has had no leg swelling. He has had no fever, sweats, or chills.  PHYSICAL EXAMINATION:  General:  This is a well-developed, well- nourished white gentleman in no obvious distress.  Vital Signs: Temperature of 98, pulse 67, respiratory rate 18, blood pressure 119/59, weight is 174 pounds.  Head and Neck:  Normocephalic, atraumatic skull. There are no ocular or oral lesions.  There are no palpable cervical or supraclavicular lymph nodes.  Lungs:  Clear to percussion and auscultation bilaterally.  Cardiac:  Regular rate and rhythm with a normal S1 and S2.  There are no murmurs, rubs, or bruits.  Abdomen: Soft.  He has good bowel sounds.  There is no palpable abdominal mass. There may be some slight tenderness in the epigastric region.  There is no fluid wave.  There is no palpable hepatosplenomegaly.  Skin:  No rashes, ecchymoses, or edema.  Neurological:  No focal neurological deficits.  LABORATORY STUDIES:  White cell count is 5.3, hemoglobin 13, hematocrit 38.5, platelet count 186.  IMPRESSION:  Brian [name] is a nice 53 year old gentleman.  He has refractory nodular sclerosing Hodgkin disease.  Again, he is doing very well.  He does not have to go back to [facility] until May.  He did have a PET scan done at that point in time.  His lab work continues to  improve.  As far as his epigastric pain is concerned, I will put him on a Prevpac p.r.n.  Hopefully, this will help.  If not, then he may need to have an upper endoscopy done.  I want to see him back myself in another 6 weeks or so, just we can followup with this and to make sure that he continues to improve.   ______________________________ [name], M.D. PRE/MEDQ  D:  12/17/2013  T:  12/18/2013  Job:  [id number]

## 2013-12-28 ENCOUNTER — Ambulatory Visit (HOSPITAL_BASED_OUTPATIENT_CLINIC_OR_DEPARTMENT_OTHER)
Admission: RE | Admit: 2013-12-28 | Discharge: 2013-12-28 | Disposition: A | Discharge status: Home / Self Care | Payer: Commercial Managed Care - PPO | Source: Ambulatory Visit | Attending: Hematology & Oncology | Admitting: Hematology & Oncology

## 2013-12-28 ENCOUNTER — Ambulatory Visit (HOSPITAL_BASED_OUTPATIENT_CLINIC_OR_DEPARTMENT_OTHER): Payer: Commercial Managed Care - PPO | Admitting: Hematology & Oncology

## 2013-12-28 DIAGNOSIS — R1903 Right lower quadrant abdominal swelling, mass and lump: Secondary | ICD-10-CM | POA: Insufficient documentation

## 2013-12-28 DIAGNOSIS — C8119 Nodular sclerosis classical Hodgkin lymphoma, extranodal and solid organ sites: Secondary | ICD-10-CM

## 2013-12-28 DIAGNOSIS — C8195 Hodgkin lymphoma, unspecified, lymph nodes of inguinal region and lower limb: Secondary | ICD-10-CM

## 2013-12-28 NOTE — Progress Notes (Signed)
This office note has been dictated.

## 2013-12-29 NOTE — Progress Notes (Signed)
DIAGNOSIS:  Refractory nodular-sclerosing Hodgkin disease.  CURRENT THERAPY:  The patient is status post autologous transplant at [facility] in May 2014.  INTERIM HISTORY:  Brian [name] comes in for an unscheduled visit.  He noted in his right groin area a nodule.  It was not painful.  It was somewhat mobile.  It was nontender.  He had no issues with his actual genital region.  When we saw him back on December 17, 2013, he was having some abdominal pain.  He was put on some, I think Prilosec.  This did seem to help him.  When we saw him just a couple weeks ago, his lab work looked okay.  LDH was normal at 142.  His CBC also was unremarkable.  He has had no change in bowel or bladder habits.  PHYSICAL EXAMINATION:  General:  This is a well-developed, well- nourished white gentleman in no obvious distress.  Vital Signs: Temperature of 98.1, pulse 78, respiratory rate 18, blood pressure 138/69, weight is 175 pounds.  Head and Neck:  Normocephalic and atraumatic skull.  He has no ocular or oral lesions.  There is no palpable cervical or supraclavicular lymph nodes.  Lungs:  Clear bilaterally.  Cardiac:  Regular rate and rhythm with a normal S1 and S2. There are no murmurs, rubs, or bruits.  Abdomen:  Soft.  He has good bowel sounds.  There is no palpable abdominal mass.  There is no fluid wave.  There is no palpable hepatosplenomegaly.  Inguinal exam shows in the right pubic area a nodule.  This probably measures about 1 cm. There is no bulb.  It is firm.  It is nontender.  I do not detect any obvious inguinal lymphadenopathy.  On the left side, no lymphadenopathy is noted.  Extremities:  No clubbing, cyanosis, or edema.  IMPRESSION:  Brian [name] is a 53 year old gentleman.  He presented with nodular-sclerosing Hodgkin disease.  He was treated __________.  After 2 cycles, he still had residual disease.  This was biopsied in the I think left axilla.  He then went on to salvage therapy and  transplant.  I am not sure what this area is.  I thought it may be at first, it will be a hernia.  It does not feel like a hernia.  I suppose there is always a concern about relapse disease.  Again, this would just be so unusual and clearly anonymous finding if this was the case.  If the CD does show [place] a significant nodule, then we are going to have to do an excisional biopsy to see what is going on.  We will move quickly through this and try to get everything done this week if there is any concern about relapsed Hodgkin disease.    ______________________________ [name], M.D. PRE/MEDQ  D:  12/28/2013  T:  12/29/2013  Job:  [id number]

## 2013-12-30 ENCOUNTER — Encounter (HOSPITAL_BASED_OUTPATIENT_CLINIC_OR_DEPARTMENT_OTHER): Payer: Self-pay | Admitting: *Deleted

## 2013-12-30 ENCOUNTER — Other Ambulatory Visit: Payer: Self-pay | Admitting: Urology

## 2013-12-30 ENCOUNTER — Encounter: Payer: Self-pay | Admitting: Nurse Practitioner

## 2013-12-30 NOTE — Progress Notes (Signed)
Pt called to inform us that he is scheduled for a biopsy on 01/04/14 with [facility] (Dr. Shona Needles).

## 2013-12-31 ENCOUNTER — Encounter (HOSPITAL_BASED_OUTPATIENT_CLINIC_OR_DEPARTMENT_OTHER): Payer: Self-pay | Admitting: *Deleted

## 2013-12-31 NOTE — Progress Notes (Signed)
NPO AFTER MN.  ARRIVE AT 0700.  NEEDS HG.  WILL TAKE PRILOSEC AM DOS W/ SIPS OF WATER. 

## 2014-01-03 ENCOUNTER — Encounter (HOSPITAL_BASED_OUTPATIENT_CLINIC_OR_DEPARTMENT_OTHER): Payer: Self-pay | Admitting: Anesthesiology

## 2014-01-03 NOTE — Anesthesia Preprocedure Evaluation (Addendum)
Anesthesia Evaluation  Patient identified by MRN, date of birth, ID band Patient awake  General Assessment Comment:  Wears contact lenses     .  H/O autologous stem cell transplant         may 2014  at [facility]   .  History of peptic ulcer     .  Mass of right inguinal region     .  History of Bell's palsy         2009  RIGHT SIDE--  HAS 80% FUNCTION / PT STATES A LITTLE ASYMETRICAL AND EFFECTS MOUTH   .  Hodgkin's disease, nodular sclerosis, of inguinal region/lower limb  ONOLOGIST--  DR [facility] AND A [facility]         SALVAGE CHEMO 2013/  AUTOLOGUS STEM CELL TRANSPLANT MAY 2014 AT [facility]        Reviewed: Allergy & Precautions, H&P , NPO status , Patient's Chart, lab work & pertinent test results  Airway Mallampati: II TM Distance: >3 FB Neck ROM: Full    Dental no notable dental hx.    Pulmonary neg pulmonary ROS,  breath sounds clear to auscultation  Pulmonary exam normal       Cardiovascular Exercise Tolerance: Good negative cardio ROS  Rhythm:Regular Rate:Normal  EF 55-60%   Neuro/Psych negative neurological ROS  negative psych ROS   GI/Hepatic negative GI ROS, Neg liver ROS,   Endo/Other  negative endocrine ROS  Renal/GU negative Renal ROS  negative genitourinary   Musculoskeletal negative musculoskeletal ROS (+)   Abdominal   Peds negative pediatric ROS (+)  Hematology  (+) Blood dyscrasia, , Hodgkin's disease s/p autologous stem cell transplant.   Anesthesia Other Findings   Reproductive/Obstetrics negative OB ROS                         Anesthesia Physical Anesthesia Plan  ASA: II  Anesthesia Plan: General   Post-op Pain Management:    Induction: Intravenous  Airway Management Planned: LMA  Additional Equipment:   Intra-op Plan:   Post-operative Plan: Extubation in OR  Informed Consent: I have reviewed the patients History and Physical, chart, labs and discussed the  procedure including the risks, benefits and alternatives for the proposed anesthesia with the patient or authorized representative who has indicated his/her understanding and acceptance.   Dental advisory given  Plan Discussed with: CRNA  Anesthesia Plan Comments:         Anesthesia Quick Evaluation

## 2014-01-04 ENCOUNTER — Encounter (HOSPITAL_BASED_OUTPATIENT_CLINIC_OR_DEPARTMENT_OTHER): Payer: Commercial Managed Care - PPO | Admitting: Anesthesiology

## 2014-01-04 ENCOUNTER — Encounter (HOSPITAL_BASED_OUTPATIENT_CLINIC_OR_DEPARTMENT_OTHER)
Admission: RE | Disposition: A | Discharge status: Home / Self Care | Payer: Self-pay | Source: Ambulatory Visit | Attending: Urology

## 2014-01-04 ENCOUNTER — Ambulatory Visit (HOSPITAL_BASED_OUTPATIENT_CLINIC_OR_DEPARTMENT_OTHER): Payer: Commercial Managed Care - PPO | Admitting: Anesthesiology

## 2014-01-04 ENCOUNTER — Ambulatory Visit (HOSPITAL_BASED_OUTPATIENT_CLINIC_OR_DEPARTMENT_OTHER)
Admission: RE | Admit: 2014-01-04 | Discharge: 2014-01-04 | Disposition: A | Discharge status: Home / Self Care | Payer: Commercial Managed Care - PPO | Source: Ambulatory Visit | Attending: Urology | Admitting: Urology

## 2014-01-04 ENCOUNTER — Encounter (HOSPITAL_BASED_OUTPATIENT_CLINIC_OR_DEPARTMENT_OTHER): Payer: Self-pay | Admitting: Anesthesiology

## 2014-01-04 DIAGNOSIS — C8190 Hodgkin lymphoma, unspecified, unspecified site: Secondary | ICD-10-CM | POA: Insufficient documentation

## 2014-01-04 DIAGNOSIS — Z9221 Personal history of antineoplastic chemotherapy: Secondary | ICD-10-CM | POA: Insufficient documentation

## 2014-01-04 DIAGNOSIS — N508 Other specified disorders of male genital organs: Secondary | ICD-10-CM | POA: Insufficient documentation

## 2014-01-04 DIAGNOSIS — C8195 Hodgkin lymphoma, unspecified, lymph nodes of inguinal region and lower limb: Secondary | ICD-10-CM

## 2014-01-04 DIAGNOSIS — Z8711 Personal history of peptic ulcer disease: Secondary | ICD-10-CM | POA: Insufficient documentation

## 2014-01-04 DIAGNOSIS — Z9484 Stem cells transplant status: Secondary | ICD-10-CM | POA: Insufficient documentation

## 2014-01-04 DIAGNOSIS — Z923 Personal history of irradiation: Secondary | ICD-10-CM | POA: Insufficient documentation

## 2014-01-04 DIAGNOSIS — Z9852 Vasectomy status: Secondary | ICD-10-CM | POA: Insufficient documentation

## 2014-01-04 HISTORY — DX: Nodular sclerosis Hodgkin lymphoma, lymph nodes of inguinal region and lower limb: C81.15

## 2014-01-04 HISTORY — DX: Other intra-abdominal and pelvic swelling, mass and lump: R19.09

## 2014-01-04 HISTORY — DX: Stem cells transplant status: Z94.84

## 2014-01-04 HISTORY — DX: Presence of spectacles and contact lenses: Z97.3

## 2014-01-04 HISTORY — DX: Personal history of other diseases of the nervous system and sense organs: Z86.69

## 2014-01-04 HISTORY — PX: SCROTAL EXPLORATION: SHX2386

## 2014-01-04 LAB — POCT HEMOGLOBIN-HEMACUE: HEMOGLOBIN: 13.9 g/dL (ref 13.0–17.0)

## 2014-01-04 SURGERY — EXPLORATION, SCROTUM
Anesthesia: General | Site: Abdomen | Laterality: Right

## 2014-01-04 MED ORDER — GLYCOPYRROLATE 0.2 MG/ML IJ SOLN
INTRAMUSCULAR | Status: DC | PRN
  Administered 2014-01-04: 0.1 mg via INTRAVENOUS

## 2014-01-04 MED ORDER — BUPIVACAINE HCL (PF) 0.25 % IJ SOLN
INTRAMUSCULAR | Status: DC | PRN
  Administered 2014-01-04: 7 mL

## 2014-01-04 MED ORDER — DEXAMETHASONE SODIUM PHOSPHATE 4 MG/ML IJ SOLN
INTRAMUSCULAR | Status: DC | PRN
  Administered 2014-01-04: 10 mg via INTRAVENOUS

## 2014-01-04 MED ORDER — LACTATED RINGERS IV SOLN
INTRAVENOUS | Status: DC | PRN
  Administered 2014-01-04 (×2): via INTRAVENOUS

## 2014-01-04 MED ORDER — KETOROLAC TROMETHAMINE 30 MG/ML IJ SOLN
INTRAMUSCULAR | Status: DC | PRN
  Administered 2014-01-04: 30 mg via INTRAVENOUS

## 2014-01-04 MED ORDER — CEFAZOLIN SODIUM-DEXTROSE 2-3 GM-% IV SOLR
INTRAVENOUS | Status: DC | PRN
  Administered 2014-01-04: 2 g via INTRAVENOUS

## 2014-01-04 MED ORDER — FENTANYL CITRATE 0.05 MG/ML IJ SOLN
25.0000 ug | INTRAMUSCULAR | Status: DC | PRN
  Filled 2014-01-04: qty 1

## 2014-01-04 MED ORDER — FENTANYL CITRATE 0.05 MG/ML IJ SOLN
INTRAMUSCULAR | Status: AC
  Filled 2014-01-04: qty 4

## 2014-01-04 MED ORDER — ONDANSETRON HCL 4 MG/2ML IJ SOLN
INTRAMUSCULAR | Status: DC | PRN
  Administered 2014-01-04: 4 mg via INTRAVENOUS

## 2014-01-04 MED ORDER — LIDOCAINE HCL (CARDIAC) 20 MG/ML IV SOLN
INTRAVENOUS | Status: DC | PRN
  Administered 2014-01-04: 60 mg via INTRAVENOUS

## 2014-01-04 MED ORDER — FENTANYL CITRATE 0.05 MG/ML IJ SOLN
INTRAMUSCULAR | Status: DC | PRN
  Administered 2014-01-04 (×2): 25 ug via INTRAVENOUS
  Administered 2014-01-04: 12.5 ug via INTRAVENOUS
  Administered 2014-01-04: 25 ug via INTRAVENOUS
  Administered 2014-01-04: 12.5 ug via INTRAVENOUS

## 2014-01-04 MED ORDER — LACTATED RINGERS IV SOLN
INTRAVENOUS | Status: DC
  Administered 2014-01-04: 08:00:00 via INTRAVENOUS
  Filled 2014-01-04: qty 1000

## 2014-01-04 MED ORDER — MIDAZOLAM HCL 5 MG/5ML IJ SOLN
INTRAMUSCULAR | Status: DC | PRN
  Administered 2014-01-04 (×2): 1 mg via INTRAVENOUS

## 2014-01-04 MED ORDER — CEFAZOLIN SODIUM-DEXTROSE 2-3 GM-% IV SOLR
2.0000 g | INTRAVENOUS | Status: DC
  Filled 2014-01-04: qty 50

## 2014-01-04 MED ORDER — PROMETHAZINE HCL 25 MG/ML IJ SOLN
6.2500 mg | INTRAMUSCULAR | Status: DC | PRN
  Filled 2014-01-04: qty 1

## 2014-01-04 MED ORDER — HYDROCODONE-ACETAMINOPHEN 5-325 MG PO TABS: 1.0000 | ORAL_TABLET | ORAL | Status: DC | PRN

## 2014-01-04 MED ORDER — PROPOFOL 10 MG/ML IV BOLUS
INTRAVENOUS | Status: DC | PRN
  Administered 2014-01-04: 200 mg via INTRAVENOUS
  Administered 2014-01-04: 50 mg via INTRAVENOUS

## 2014-01-04 MED ORDER — MIDAZOLAM HCL 2 MG/2ML IJ SOLN
INTRAMUSCULAR | Status: AC
  Filled 2014-01-04: qty 2

## 2014-01-04 SURGICAL SUPPLY — 56 items
APPLICATOR COTTON TIP 6IN STRL (MISCELLANEOUS) IMPLANT
BENZOIN TINCTURE PRP APPL 2/3 (GAUZE/BANDAGES/DRESSINGS) IMPLANT
BLADE SURG 15 STRL LF DISP TIS (BLADE) ×1 IMPLANT
BLADE SURG 15 STRL SS (BLADE) ×2
BLADE SURG ROTATE 9660 (MISCELLANEOUS) ×3 IMPLANT
BNDG GAUZE ELAST 4 BULKY (GAUZE/BANDAGES/DRESSINGS) ×3 IMPLANT
CANISTER SUCTION 1200CC (MISCELLANEOUS) ×3 IMPLANT
CLEANER CAUTERY TIP 5X5 PAD (MISCELLANEOUS) ×1 IMPLANT
CLOSURE WOUND 1/2 X4 (GAUZE/BANDAGES/DRESSINGS) ×1
CLOTH BEACON ORANGE TIMEOUT ST (SAFETY) ×3 IMPLANT
COVER MAYO STAND STRL (DRAPES) ×3 IMPLANT
COVER TABLE BACK 60X90 (DRAPES) ×3 IMPLANT
DERMABOND ADVANCED (GAUZE/BANDAGES/DRESSINGS) ×2
DERMABOND ADVANCED .7 DNX12 (GAUZE/BANDAGES/DRESSINGS) ×1 IMPLANT
DISSECTOR ROUND CHERRY 3/8 STR (MISCELLANEOUS) IMPLANT
DRAIN PENROSE 18X1/4 LTX STRL (WOUND CARE) ×3 IMPLANT
DRAPE PED LAPAROTOMY (DRAPES) ×3 IMPLANT
DRSG TEGADERM 4X4.75 (GAUZE/BANDAGES/DRESSINGS) IMPLANT
ELECT NEEDLE TIP 2.8 STRL (NEEDLE) ×3 IMPLANT
ELECT REM PT RETURN 9FT ADLT (ELECTROSURGICAL) ×3
ELECTRODE REM PT RTRN 9FT ADLT (ELECTROSURGICAL) ×1 IMPLANT
GAUZE SPONGE 4X4 12PLY STRL LF (GAUZE/BANDAGES/DRESSINGS) IMPLANT
GLOVE BIO SURGEON STRL SZ8 (GLOVE) ×3 IMPLANT
GLOVE BIOGEL M STER SZ 6 (GLOVE) ×3 IMPLANT
GLOVE BIOGEL PI IND STRL 6.5 (GLOVE) ×1 IMPLANT
GLOVE BIOGEL PI IND STRL 7.5 (GLOVE) ×1 IMPLANT
GLOVE BIOGEL PI INDICATOR 6.5 (GLOVE) ×2
GLOVE BIOGEL PI INDICATOR 7.5 (GLOVE) ×2
GOWN STRL REIN XL XLG (GOWN DISPOSABLE) ×3 IMPLANT
GOWN STRL REUS W/ TWL XL LVL3 (GOWN DISPOSABLE) ×1 IMPLANT
GOWN STRL REUS W/TWL LRG LVL3 (GOWN DISPOSABLE) ×3 IMPLANT
GOWN STRL REUS W/TWL XL LVL3 (GOWN DISPOSABLE) ×2
GOWN W/COTTON TOWEL STD LRG (GOWNS) IMPLANT
GOWN XL W/COTTON TOWEL STD (GOWNS) IMPLANT
NEEDLE HYPO 22GX1.5 SAFETY (NEEDLE) IMPLANT
NS IRRIG 500ML POUR BTL (IV SOLUTION) IMPLANT
PACK BASIN DAY SURGERY FS (CUSTOM PROCEDURE TRAY) ×3 IMPLANT
PAD CLEANER CAUTERY TIP 5X5 (MISCELLANEOUS) ×2
PENCIL BUTTON HOLSTER BLD 10FT (ELECTRODE) ×3 IMPLANT
STRIP CLOSURE SKIN 1/2X4 (GAUZE/BANDAGES/DRESSINGS) ×2 IMPLANT
SUPPORT SCROTAL LG STRP (MISCELLANEOUS) IMPLANT
SUPPORT SCROTAL MED ADLT STRP (MISCELLANEOUS) ×2 IMPLANT
SUPPORTER ATHLETIC LG (MISCELLANEOUS)
SUPPORTER ATHLETIC MED (MISCELLANEOUS) ×1
SUT CHROMIC 2 0 SH (SUTURE) IMPLANT
SUT CHROMIC 3 0 SH 27 (SUTURE) ×3 IMPLANT
SUT CHROMIC 4 0 SH 27 (SUTURE) ×3 IMPLANT
SUT MON AB 5-0 PS2 18 (SUTURE) ×3 IMPLANT
SYR BULB IRRIGATION 50ML (SYRINGE) ×3 IMPLANT
SYR CONTROL 10ML LL (SYRINGE) ×3 IMPLANT
TOWEL OR 17X24 6PK STRL BLUE (TOWEL DISPOSABLE) ×6 IMPLANT
TRAY DSU PREP LF (CUSTOM PROCEDURE TRAY) ×3 IMPLANT
TUBE CONNECTING 12'X1/4 (SUCTIONS) ×1
TUBE CONNECTING 12X1/4 (SUCTIONS) ×2 IMPLANT
WATER STERILE IRR 500ML POUR (IV SOLUTION) IMPLANT
YANKAUER SUCT BULB TIP NO VENT (SUCTIONS) ×3 IMPLANT

## 2014-01-04 NOTE — Anesthesia Procedure Notes (Signed)
Procedure Name: LMA Insertion Date/Time: 01/04/2014 8:32 AM Performed by: Justice Rocher Pre-anesthesia Checklist: Patient identified, Emergency Drugs available, Suction available and Patient being monitored Patient Re-evaluated:Patient Re-evaluated prior to inductionOxygen Delivery Method: Circle System Utilized Preoxygenation: Pre-oxygenation with 100% oxygen Intubation Type: IV induction Ventilation: Mask ventilation without difficulty LMA: LMA inserted LMA Size: 4.0 Number of attempts: 1 Airway Equipment and Method: bite block Placement Confirmation: positive ETCO2 Tube secured with: Tape Dental Injury: Teeth and Oropharynx as per pre-operative assessment

## 2014-01-04 NOTE — Discharge Instructions (Signed)
1. Use ice pack to the groin and scrotal area today  2. Okay to shower starting on Tuesday  3. Wear supportive undergarment for a few days  4. Gradually increase activities-you can drive tomorrow if you're not in any pain  5. You may experience some bruising of your groin or scrotal area. Call Dr. Diona Fanti at [phone] for any significant concerns  6. Okay to take aspirin, Advil or Aleve for mild to moderate pain. You can take hydrocodone for more severe pain.   Post Anesthesia Home Care Instructions  Activity: Get plenty of rest for the remainder of the day. A responsible adult should stay with you for 24 hours following the procedure.  For the next 24 hours, DO NOT: -Drive a car -Paediatric nurse -Drink alcoholic beverages -Take any medication unless instructed by your physician -Make any legal decisions or sign important papers.  Meals: Start with liquid foods such as gelatin or soup. Progress to regular foods as tolerated. Avoid greasy, spicy, heavy foods. If nausea and/or vomiting occur, drink only clear liquids until the nausea and/or vomiting subsides. Call your physician if vomiting continues.  Special Instructions/Symptoms: Your throat may feel dry or sore from the anesthesia or the breathing tube placed in your throat during surgery. If this causes discomfort, gargle with warm salt water. The discomfort should disappear within 24 hours.

## 2014-01-04 NOTE — Op Note (Signed)
Preoperative  diagnosis: History of Hodgkin's lymphoma, with new right groin mass  Postoperative diagnosis: Probable hydrocele the cord   Procedure: Inguinal exploration, exploration of cord and right testicle, excision of cystic structure of right cord    Surgeon: Lillette Boxer. Kazi Montoro, M.D.   Anesthesia: Gen.   Complications: None  Specimen(s): Cystic mass from right cord  Drain(s): None  Indications: 53 year old male status post excisional biopsy, chemotherapy, radiotherapy and stem cell transplant for Hodgkin's lymphoma. He is followed by Dr. Burney Gauze. The patient recently presented to Dr. Marin Olp with a newly palpable right groin mass. This was found to be cystic on CT scan. In the office last week, this was fairly mobile, and I thought associated with the right spermatic cord. He presents at this time for definitive excision, possible orchiectomy. The patient is aware of risks and complications and desires to proceed.   Technique and findings: The patient was properly marked and identified in the holding area. He was taken the operating room following administration of IV antibiotics. He was placed in the supine position on the table, and general anesthetic was administered with the LMA. Genitalia, perineum and lower abdomen were prepped and draped. Proper timeout was performed.  A 2-1/2 cm incision was carried out just over the external inguinal ring and taken down to the ring structures with combined blunt and sharp dissection. No hernia was encountered. There appeared to be a cystic structure of the proximal cord, without any solid elements. The cord was dissected inferiorly, and the testicle brought into the surgical field after the gubernaculum was excised. No other abnormality was seen of the testicle or the cord. The cystic structure was carefully dissected free from the cord elements, taking care to avoid injury to the ilioinguinal nerve and the vas deferens (the patient had  already had a vasectomy). The cystic structure was totally excised. I did not see any solid elements. It was sent for frozen section, and Dr. Truman Hayward called that it was a benign cyst. Hemostasis at this point was excellent. 9 cc of quarter percent plain Marcaine were used to infiltrate the patient's subcutaneous tissue as well as cord. At this point, the testicles were placed in the right hemiscrotum. Subcutaneous structures were reapproximated using 2-0 chromic placed in a simple running fashion. Skin edges were reapproximated with a running subcuticular suture of 5-0 Monocryl. Dermabond was placed. A scrotal support was placed over top of fluffs.  The patient was awakened and taken to the PACU in stable condition. He tolerated the procedure well.

## 2014-01-04 NOTE — H&P (Signed)
Urology History and Physical Exam  CC: Lump in right groin  HPI:    This 53 year old male was s sent to see me last week by Dr. Marin Olp for evaluation and management of a newly discovered right inguinal mass. The patient's history is significant for Hodgkin's lymphoma, and he is status post chemotherapy, radiation and a stem cell transplant, managed in [facility] in 2013. Dr. Osborn Coho of [facility] surgery performed his lymph node biopsy which diagnosed his lymphoma. He tolerated this therapy well. He did spend about a month getting treatment in [place], [place]. His most recent PET/CT scan was a few months ago and was negative. Recently, he discovered a lump in his right groin. This was evaluated by Dr. Marin Olp recently, and he had a CT scan  which revealed a partially cystic 25 mm mass in his right groin. He is here today for further evaluation and management. I performed a vasectomy on him in approximately 2007. Exam revealed a mobile structure closely applied to his right spermatic cord. He presents for right groin exploration/excsion of mass, possible right orchiectomy.  He has had no recent cough, sputum production, weight gain or weight loss, change in urinary tract or bowel function.  PMH: Past Medical History  Diagnosis Date  . Wears contact lenses   . H/O autologous stem cell transplant     may 2014  at [facility]  . History of peptic ulcer   . Mass of right inguinal region   . History of Bell's palsy     2009  RIGHT SIDE--  HAS 80% FUNCTION / PT STATES A LITTLE ASYMETRICAL AND EFFECTS MOUTH  . Hodgkin's disease, nodular sclerosis, of inguinal region/lower limb ONOLOGIST--  DR ENNE[facility] AND [facility]      SALVAGE CHEMO 2013/  AUTOLOGUS STEM CELL TRANSPLANT MAY 2014 AT [facility]    PSH: Past Surgical History  Procedure Laterality Date  . Axillary lymph node biopsy Left 02/02/2013    Procedure: NEEDLE LOCALIZED AXILLARY LYMPH NODE BIOPSY;  Surgeon: Haywood Lasso, MD;  Location: [place];  Service: General;  Laterality: Left;  . Removal left neck cyst  1980  . Removal right inguinal lymph nodes  08-16-2011  . Left inguinal lymph node bx  09-09-2012  . Transthoracic echocardiogram  03-18-2013    MILD LVH/  EF 55-60%    Allergies: No Known Allergies  Medications: No prescriptions prior to admission     Social History: History   Social History  . Marital Status: Married    Spouse Name: N/A    Number of Children: N/A  . Years of Education: N/A   Occupational History  . Editor    Social History Main Topics  . Smoking status: Never Smoker   . Smokeless tobacco: Never Used  . Alcohol Use: Yes     Comment: OCCASIONAL  . Drug Use: No  . Sexual Activity: Not on file   Other Topics Concern  . Not on file   Social History Narrative   Married. Education: [employer]. Exercise: jog/walk/ lift weights 7 days a week, 1-2 miles.    Family History: Family History  Problem Relation Age of Onset  . Cancer Mother 94    ovarian  . Heart disease Father   . Stroke Father   . Cancer Sister     ovarian  . Cancer Brother     testicular    Review of Systems: Genitourinary, constitutional, skin, eye, otolaryngeal, hematologic/lymphatic, cardiovascular, pulmonary,  endocrine, musculoskeletal, gastrointestinal, neurological and psychiatric system(s) were reviewed and pertinent findings if present are noted.  Genitourinary: nocturia and inguinal swelling.  Gastrointestinal: heartburn.  Integumentary: pruritus.  Physical Exam: Constitutional: Well nourished and well developed . No acute distress.  ENT:. The ears and nose are normal in appearance.  Neck: The appearance of the neck is normal and no neck mass is present.  Pulmonary: No respiratory distress and normal respiratory rhythm and effort.  Abdomen: The abdomen is flat. The abdomen is soft and nontender. right inguinal a mass is palpable (3). This is freely mobile, I  can access it through his right upper scrotum. It is nontender. I don't think it is associated with a inguinal hernia. It is closely applied to the spermatic cord. No CVA tenderness.  A left inguinal hernia is present, which is reducible. No hepatosplenomegaly noted.  Rectal: Rectal exam demonstrates normal sphincter tone, the anus is normal on inspection., no tenderness and no masses. The prostate is smooth and flat . Estimated prostate size is 1+. The prostate has no nodularity and is not tender. The left seminal vesicle is nonpalpable. The right seminal vesicle is nonpalpable. The perineum is normal on inspection.  Genitourinary: Examination of the penis demonstrates no discharge, no masses, no lesions and a normal meatus. The penis is circumcised. The scrotum is without lesions. The right epididymis is palpably normal and non-tender. The left epididymis is palpably normal and non-tender. The right testis is non-tender and without masses. The left testis is non-tender and without masses.      Studies:  No results found for this basename: HGB, WBC, PLT,  in the last 72 hours  No results found for this basename: NA, K, CL, CO2, BUN, CREATININE, CALCIUM, MAGNESIUM, GFRNONAA, GFRAA,  in the last 72 hours   No results found for this basename: PT, INR, APTT,  in the last 72 hours   No components found with this basename: ABG,     Assessment:    1. New onset of right inguinal mass. This is possibly a lymph node, but unlikely, as it is freely mobile. It seems closely applied to the spermatic cord. This could be a lymphocele from his old lymph node biopsy, hydrocele of the cord, or a benign or malignant neoplasm of his cord. Of course, recurrent lymphoma is in the differential as well, but I think this is unlikely  2. History of Hodgkin's lymphoma, status post radiotherapy, chemotherapy and stem cell transplant. As of late, no evidence of recurrence    Plan: Right inguinal exploration, excision  of right inguinal/cord mass, possible right inguinal orchiectomy.

## 2014-01-04 NOTE — Transfer of Care (Signed)
Immediate Anesthesia Transfer of Care Note  Patient: Brian [name]  Procedure(s) Performed: Procedure(s) (LRB): SCROTUM EXPLORATION   INGUINAL , EXCISION OF CYSTIC MASS OF RIGHT SPERMATIC CORD, WITH FROZEN SECTION (Right)  Patient Location: PACU  Anesthesia Type: General  Level of Consciousness: awake, sedated, patient cooperative and responds to stimulation  Airway & Oxygen Therapy: Patient Spontanous Breathing and Patient connected to face mask oxygen  Post-op Assessment: Report given to PACU RN, Post -op Vital signs reviewed and stable and Patient moving all extremities  Post vital signs: Reviewed and stable  Complications: No apparent anesthesia complications

## 2014-01-04 NOTE — Anesthesia Postprocedure Evaluation (Signed)
  Anesthesia Post-op Note  Patient: Brian [name]  Procedure(s) Performed: Procedure(s) (LRB): SCROTUM EXPLORATION   INGUINAL , EXCISION OF CYSTIC MASS OF RIGHT SPERMATIC CORD, WITH FROZEN SECTION (Right)  Patient Location: PACU  Anesthesia Type: General  Level of Consciousness: awake and alert   Airway and Oxygen Therapy: Patient Spontanous Breathing  Post-op Pain: mild  Post-op Assessment: Post-op Vital signs reviewed, Patient's Cardiovascular Status Stable, Respiratory Function Stable, Patent Airway and No signs of Nausea or vomiting  Last Vitals:  Filed Vitals:   01/04/14 1010  BP: 111/86  Pulse: 69  Temp:   Resp: 12    Post-op Vital Signs: stable   Complications: No apparent anesthesia complications

## 2014-01-04 NOTE — Interval H&P Note (Signed)
History and Physical Interval Note:  01/04/2014 8:24 AM  Brian [name]  has presented today for surgery, with the diagnosis of RIGHT INGUINAL MASS, HISTORY OF HODGINS  The various methods of treatment have been discussed with the patient and family. After consideration of risks, benefits and other options for treatment, the patient has consented to  Procedure(s): SCROTUM EXPLORATION   INGUINAL EXPLORATION, EXCISION OF MASS, POSSIBLE RIGHT ORCHIECTOMY ,POSSIBLE FROZEN SECTION (Right) as a surgical intervention .  The patient's history has been reviewed, patient examined, no change in status, stable for surgery.  I have reviewed the patient's chart and labs.  Questions were answered to the patient's satisfaction.     Jorja Loa

## 2014-01-05 ENCOUNTER — Encounter (HOSPITAL_BASED_OUTPATIENT_CLINIC_OR_DEPARTMENT_OTHER): Payer: Self-pay | Admitting: Urology

## 2014-01-06 ENCOUNTER — Telehealth: Payer: Self-pay | Admitting: *Deleted

## 2014-01-06 NOTE — Telephone Encounter (Signed)
Zeki asked [place] to send [facility] his latest pathology and op report from his groin biopsy by Dr. Diona Fanti.  Sent to [facility] at [facility]

## 2014-01-22 ENCOUNTER — Encounter: Payer: Self-pay | Admitting: *Deleted

## 2014-01-22 NOTE — Progress Notes (Signed)
Pt has appt with Dr. Teena Irani on 01/25/14 at 11am.  That office has access to progress notes and scans.  I faxed recent labs to them.

## 2014-01-28 ENCOUNTER — Other Ambulatory Visit: Payer: Commercial Managed Care - PPO | Admitting: Lab

## 2014-01-28 ENCOUNTER — Ambulatory Visit: Payer: Commercial Managed Care - PPO | Admitting: Hematology & Oncology

## 2014-02-02 ENCOUNTER — Encounter: Payer: Self-pay | Admitting: Emergency Medicine

## 2014-02-02 ENCOUNTER — Ambulatory Visit (INDEPENDENT_AMBULATORY_CARE_PROVIDER_SITE_OTHER): Payer: Commercial Managed Care - PPO | Admitting: Emergency Medicine

## 2014-02-02 VITALS — BP 120/80 | HR 86 | Temp 98.2°F | Resp 16 | Ht 71.0 in | Wt 170.0 lb

## 2014-02-02 DIAGNOSIS — E785 Hyperlipidemia, unspecified: Secondary | ICD-10-CM

## 2014-02-02 DIAGNOSIS — R109 Unspecified abdominal pain: Secondary | ICD-10-CM

## 2014-02-02 DIAGNOSIS — R1013 Epigastric pain: Secondary | ICD-10-CM | POA: Insufficient documentation

## 2014-02-02 LAB — LIPID PANEL
CHOL/HDL RATIO: 4 ratio
CHOLESTEROL: 178 mg/dL (ref 0–200)
HDL: 44 mg/dL (ref >39)
LDL Cholesterol: 115 mg/dL — ABNORMAL HIGH (ref 0–99)
TRIGLYCERIDES: 97 mg/dL (ref <150)
VLDL: 19 mg/dL (ref 0–40)

## 2014-02-02 NOTE — Progress Notes (Signed)
Subjective:  This chart was scribed for Darlyne Russian, MD by Mercy Moore, Medial Scribe. This patient was seen in room 21 and the patient's care was started at 9:45 AM. He is here to followup on his hyperlipidemia. He also has been having some intermittent upper abdominal pain and is scheduled for an endoscopy with Dr. Amedeo Plenty .    Patient ID: Brian [name], male    DOB: June 02, 1961, 53 y.o.   MRN: [id number]  HPI Brian [name] is a 53 y.o. male   Patient Active Problem List   Diagnosis Date Noted  . Weight loss 08/12/2012  . H/O Bell's palsy 08/12/2012  . Hodgkin's disease 08/09/2011   Past Medical History  Diagnosis Date  . Wears contact lenses   . H/O autologous stem cell transplant     may 2014  at [facility]  . History of peptic ulcer   . Mass of right inguinal region   . History of Bell's palsy     2009  RIGHT SIDE--  HAS 80% FUNCTION / PT STATES A LITTLE ASYMETRICAL AND EFFECTS MOUTH  . Hodgkin's disease, nodular sclerosis, of inguinal region/lower limb ONOLOGIST--  DR ENNEVER AND A [facility]      SALVAGE CHEMO 2013/  AUTOLOGUS STEM CELL TRANSPLANT MAY 2014 AT [facility]   Past Surgical History  Procedure Laterality Date  . Axillary lymph node biopsy Left 02/02/2013    Procedure: NEEDLE LOCALIZED AXILLARY LYMPH NODE BIOPSY;  Surgeon: Haywood Lasso, MD;  Location: [place];  Service: General;  Laterality: Left;  . Removal left neck cyst  1980  . Removal right inguinal lymph nodes  08-16-2011  . Left inguinal lymph node bx  09-09-2012  . Transthoracic echocardiogram  03-18-2013    MILD LVH/  EF 55-60%  . Scrotal exploration Right 01/04/2014    Procedure: SCROTUM EXPLORATION   INGUINAL , EXCISION OF CYSTIC MASS OF RIGHT SPERMATIC CORD, WITH FROZEN SECTION;  Surgeon: Franchot Gallo, MD;  Location: [facility];  Service: Urology;  Laterality: Right;   No Known Allergies Prior to Admission medications   Medication Sig Start Date End Date Taking?  Authorizing Provider  acetaminophen (TYLENOL) 500 MG tablet Take 500 mg by mouth every 6 (six) hours as needed.    Historical Provider, MD  acyclovir (ZOVIRAX) 800 MG tablet Take 800 mg by mouth 2 (two) times daily.    Historical Provider, MD  ALPRAZolam Duanne Moron) 0.5 MG tablet Take 1 tablet (0.5 mg total) by mouth at bedtime as needed for sleep. 07/27/13   Darlyne Russian, MD  aspirin 325 MG tablet Take 325 mg by mouth as needed for pain.    Historical Provider, MD  Cholecalciferol (VITAMIN D) 1000 UNITS capsule Take 1,000 Units by mouth 2 (two) times daily.     Historical Provider, MD  HYDROcodone-acetaminophen (NORCO) 5-325 MG per tablet Take 1-2 tablets by mouth every 4 (four) hours as needed for moderate pain. 01/04/14   Franchot Gallo, MD  ibuprofen (ADVIL,MOTRIN) 200 MG tablet Take 200 mg by mouth every 6 (six) hours as needed.    Historical Provider, MD  LORazepam (ATIVAN) 1 MG tablet Take 1 mg by mouth every 6 (six) hours as needed for anxiety.    Historical Provider, MD  Multiple Vitamin (MULTIVITAMIN) tablet Take 1 tablet by mouth daily.    Historical Provider, MD  omeprazole (PRILOSEC) 40 MG capsule Take 40 mg by mouth every morning.     Historical Provider, MD  Probiotic Product (PROBIOTIC DAILY PO) Take 1 capsule by mouth daily.    Historical Provider, MD   History   Social History  . Marital Status: Married    Spouse Name: N/A    Number of Children: N/A  . Years of Education: N/A   Occupational History  . Editor    Social History Main Topics  . Smoking status: Never Smoker   . Smokeless tobacco: Never Used  . Alcohol Use: Yes     Comment: OCCASIONAL  . Drug Use: No  . Sexual Activity: Not on file   Other Topics Concern  . Not on file   Social History Narrative   Married. Education: [employer]. Exercise: jog/walk/ lift weights 7 days a week, 1-2 miles.      Review of Systems     Objective:   Physical Exam   CONSTITUTIONAL: Well developed/well nourished HEAD:  Normocephalic/atraumatic EYES: EOMI/PERRL ENMT: Mucous membranes moist NECK: supple no meningeal signs SPINE:entire spine nontender CV: S1/S2 noted, no murmurs/rubs/gallops noted LUNGS: Lungs are clear to auscultation bilaterally, no apparent distress ABDOMEN: soft, nontender, no rebound or guarding. I do not feel any upper abdominal tenderness to GU:no cva tenderness NEURO: Pt is awake/alert, moves all extremitiesx4 EXTREMITIES: pulses normal, full ROM SKIN: warm, color normal there is a healing scar in the right inguinal area. PSYCH: no abnormalities of mood noted      Assessment & Plan:  Patient here to followup his hyperlipidemia. He has been doing some with depression. He has some upper abdominal pain and is having an endoscopy with Dr. Amedeo Plenty. He will need further evaluation of this. He is due for his pet scan this summer. Followup his stem cell treatment by [facility].

## 2014-02-04 ENCOUNTER — Other Ambulatory Visit: Payer: Self-pay | Admitting: Gastroenterology

## 2014-02-05 ENCOUNTER — Telehealth: Payer: Self-pay

## 2014-02-05 NOTE — Telephone Encounter (Signed)
Dr.Daub, Pt would like to speak with you regarding a referral for counseling. Pt would only like to speak with Dr.Daub only.  Best# [phone]

## 2014-02-07 ENCOUNTER — Other Ambulatory Visit: Payer: Self-pay | Admitting: Emergency Medicine

## 2014-02-07 DIAGNOSIS — F329 Major depressive disorder, single episode, unspecified: Secondary | ICD-10-CM

## 2014-02-07 DIAGNOSIS — F32A Depression, unspecified: Secondary | ICD-10-CM

## 2014-02-07 NOTE — Telephone Encounter (Signed)
Dr. Everlene Farrier, pt wants to know who you recommend for counseling. Really wants to talk with someone. Please advise. Thanks

## 2014-02-12 ENCOUNTER — Other Ambulatory Visit: Payer: Self-pay | Admitting: Emergency Medicine

## 2014-02-12 ENCOUNTER — Other Ambulatory Visit: Payer: Self-pay | Admitting: *Deleted

## 2014-02-12 DIAGNOSIS — N529 Male erectile dysfunction, unspecified: Secondary | ICD-10-CM

## 2014-02-12 DIAGNOSIS — R1013 Epigastric pain: Secondary | ICD-10-CM

## 2014-02-12 MED ORDER — SILDENAFIL CITRATE 100 MG PO TABS: 50.0000 mg | ORAL_TABLET | Freq: Every day | ORAL | Status: DC | PRN

## 2014-02-12 NOTE — Telephone Encounter (Signed)
Patient called stated he was having issues with the ED and would like to try Viagra. This was called into the pharmacy.

## 2014-02-14 ENCOUNTER — Telehealth: Payer: Self-pay

## 2014-02-14 NOTE — Telephone Encounter (Signed)
Patient called requesting a refill on xanax 0.5  Mg and Lorazepam 1 mg 4 hours as needed .

## 2014-02-15 MED ORDER — LORAZEPAM 1 MG PO TABS: ORAL_TABLET | ORAL | Status: DC

## 2014-02-15 NOTE — Telephone Encounter (Signed)
Please call patient. He can have one or the other but not both. They worked the same way and should not be taken together. My suggestion would be to take the Ativan which is lorazepam 1 mg and he can take a half  every 6-8 hours during the day and one at night as necessary and he can have #30 tablets with one refill

## 2014-02-15 NOTE — Telephone Encounter (Signed)
Dr Everlene Farrier asked me to send Rx for lorazepam 1 mg 1/2 tab twice daily during day and 1 tab at bedtime as needed. He revised his earlier order below and would only like #20 called in at a time w/3RFs. Sent in Rx ordered verbal w/readback and notified pt. Explained that Dr Everlene Farrier does not want him taking both Rxs. Pt stated that he does not take both, only one or the other. He stated the ativan helps him sleep better at night. I advised to try just the ativan as Rxd and if there is any problems to RTC to discuss w/Dr Daub. Pt agreed.

## 2014-02-17 ENCOUNTER — Ambulatory Visit (HOSPITAL_BASED_OUTPATIENT_CLINIC_OR_DEPARTMENT_OTHER)
Admission: RE | Admit: 2014-02-17 | Discharge: 2014-02-17 | Disposition: A | Discharge status: Home / Self Care | Payer: Commercial Managed Care - PPO | Source: Ambulatory Visit | Attending: Hematology & Oncology | Admitting: Hematology & Oncology

## 2014-02-17 ENCOUNTER — Other Ambulatory Visit (HOSPITAL_BASED_OUTPATIENT_CLINIC_OR_DEPARTMENT_OTHER): Payer: Commercial Managed Care - PPO | Admitting: Lab

## 2014-02-17 ENCOUNTER — Ambulatory Visit (HOSPITAL_BASED_OUTPATIENT_CLINIC_OR_DEPARTMENT_OTHER): Payer: Commercial Managed Care - PPO | Admitting: Hematology & Oncology

## 2014-02-17 ENCOUNTER — Encounter: Payer: Self-pay | Admitting: Hematology & Oncology

## 2014-02-17 VITALS — BP 125/76 | HR 88 | Temp 98.2°F | Resp 18 | Ht 69.0 in | Wt 167.0 lb

## 2014-02-17 DIAGNOSIS — M810 Age-related osteoporosis without current pathological fracture: Secondary | ICD-10-CM

## 2014-02-17 DIAGNOSIS — F5221 Male erectile disorder: Secondary | ICD-10-CM

## 2014-02-17 DIAGNOSIS — R1013 Epigastric pain: Secondary | ICD-10-CM | POA: Insufficient documentation

## 2014-02-17 DIAGNOSIS — C8119 Nodular sclerosis classical Hodgkin lymphoma, extranodal and solid organ sites: Secondary | ICD-10-CM

## 2014-02-17 DIAGNOSIS — C8195 Hodgkin lymphoma, unspecified, lymph nodes of inguinal region and lower limb: Secondary | ICD-10-CM

## 2014-02-17 DIAGNOSIS — K279 Peptic ulcer, site unspecified, unspecified as acute or chronic, without hemorrhage or perforation: Secondary | ICD-10-CM

## 2014-02-17 LAB — LACTATE DEHYDROGENASE: LDH: 141 U/L (ref 94–250)

## 2014-02-17 LAB — CBC WITH DIFFERENTIAL (CANCER CENTER ONLY)
BASO#: 0 10*3/uL (ref 0.0–0.2)
BASO%: 0.2 % (ref 0.0–2.0)
EOS%: 0.7 % (ref 0.0–7.0)
Eosinophils Absolute: 0 10*3/uL (ref 0.0–0.5)
HCT: 42.2 % (ref 38.7–49.9)
HGB: 14.2 g/dL (ref 13.0–17.1)
LYMPH#: 0.6 10*3/uL — ABNORMAL LOW (ref 0.9–3.3)
LYMPH%: 10.1 % — ABNORMAL LOW (ref 14.0–48.0)
MCH: 32.9 pg (ref 28.0–33.4)
MCHC: 33.6 g/dL (ref 32.0–35.9)
MCV: 98 fL (ref 82–98)
MONO#: 0.7 10*3/uL (ref 0.1–0.9)
MONO%: 11.7 % (ref 0.0–13.0)
NEUT%: 77.3 % (ref 40.0–80.0)
NEUTROS ABS: 4.3 10*3/uL (ref 1.5–6.5)
PLATELETS: 182 10*3/uL (ref 145–400)
RBC: 4.32 10*6/uL (ref 4.20–5.70)
RDW: 12.7 % (ref 11.1–15.7)
WBC: 5.6 10*3/uL (ref 4.0–10.0)

## 2014-02-17 LAB — CMP (CANCER CENTER ONLY)
ALK PHOS: 71 U/L (ref 26–84)
ALT: 26 U/L (ref 10–47)
AST: 28 U/L (ref 11–38)
Albumin: 4.1 g/dL (ref 3.3–5.5)
BILIRUBIN TOTAL: 0.9 mg/dL (ref 0.20–1.60)
BUN, Bld: 16 mg/dL (ref 7–22)
CO2: 30 mEq/L (ref 18–33)
Calcium: 9.3 mg/dL (ref 8.0–10.3)
Chloride: 104 mEq/L (ref 98–108)
Creat: 1.1 mg/dl (ref 0.6–1.2)
Glucose, Bld: 110 mg/dL (ref 73–118)
Potassium: 4.1 mEq/L (ref 3.3–4.7)
SODIUM: 140 meq/L (ref 128–145)
Total Protein: 6.8 g/dL (ref 6.4–8.1)

## 2014-02-18 NOTE — Progress Notes (Signed)
Hematology and Oncology Follow Up Visit  HARUTYUN MONTEVERDE [id number] Jun 02, 1961 53 y.o. 02/18/2014   Principle Diagnosis:  Refractory nodular-sclerosing Hodgkin disease.   Current Therapy:   The patient is status post autologous transplant at [facility] in May 2014.     Interim History:  Mr.  Scholle is back for followup. Unfortunately, he is really having a tough time personally. Neurological issues going on with his family. He is trying to handle all of these. Has been incredibly difficult for him.  He is still working. There is stress at work.  He recently underwent removal of a cyst from his right inguinal canal. This really upset him and thing that he had recurrent Hodgkin's disease. However, there is no evidence of recurrent disease. This was a benign type process.  He does have erectile dysfunction now. He does not want any " help" for this given his family situation.  He's tried exercise. He is losing some weight. His not eating all that well.  Back to [facility] for followup in May. A PET scan will be done then.  He's had no fever. He says that a couple night sweats. He's had no cough. He's had no nausea vomiting.  Medications: Current outpatient prescriptions:acetaminophen (TYLENOL) 500 MG tablet, Take 500 mg by mouth every 6 (six) hours as needed., Disp: , Rfl: ;  acyclovir (ZOVIRAX) 800 MG tablet, Take 800 mg by mouth 2 (two) times daily., Disp: , Rfl: ;  ALPRAZolam (XANAX) 0.5 MG tablet, Take 1 tablet (0.5 mg total) by mouth at bedtime as needed for sleep., Disp: 30 tablet, Rfl: 2;  aspirin 325 MG tablet, Take 325 mg by mouth as needed for pain., Disp: , Rfl:  Cholecalciferol (VITAMIN D) 1000 UNITS capsule, Take 1,000 Units by mouth 2 (two) times daily. , Disp: , Rfl: ;  HYDROcodone-acetaminophen (NORCO) 5-325 MG per tablet, Take 1-2 tablets by mouth every 4 (four) hours as needed for moderate pain., Disp: 15 tablet, Rfl: 0;  ibuprofen (ADVIL,MOTRIN) 200 MG tablet, Take 200 mg by mouth every 6  (six) hours as needed., Disp: , Rfl:  LORazepam (ATIVAN) 1 MG tablet, Take 1/2 tablet twice during the day as needed for anxiety and 1 tablet at bedtime as needed., Disp: 20 tablet, Rfl: 3;  Multiple Vitamin (MULTIVITAMIN) tablet, Take 1 tablet by mouth daily., Disp: , Rfl: ;  sildenafil (VIAGRA) 100 MG tablet, Take 0.5-1 tablets (50-100 mg total) by mouth daily as needed for erectile dysfunction., Disp: 5 tablet, Rfl: 11  Allergies: No Known Allergies  Past Medical History, Surgical history, Social history, and Family History were reviewed and updated.  Review of Systems: As above  Physical Exam:  height is 5\' 9"  (1.753 m) and weight is 167 lb (75.751 kg). His oral temperature is 98.2 F (36.8 C). His blood pressure is 125/76 and his pulse is 88. His respiration is 18.   Head and neck exam shows no adenopathy in neck. There is no ocular or oral lesions. He has no scleral icterus. Lungs are clear. Cardiac exam regular rate and rhythm. No murmurs rubs or bruits. Abdomen is soft. She has good bowel sounds. There is no fluid wave. There is no palpable hepatosplenomegaly he has well healed right inguinal surgical incision. Back exam no tenderness over the spine ribs or hips. Extremities no clubbing cyanosis or edema. Skin exam no rashes. Neurological exam no focal deficits.  Lab Results  Component Value Date   WBC 5.6 02/17/2014   HGB 14.2 02/17/2014  HCT 42.2 02/17/2014   MCV 98 02/17/2014   PLT 182 02/17/2014     Chemistry      Component Value Date/Time   NA 140 02/17/2014 1136   NA 141 09/16/2013 0821   NA 142 03/25/2013 0828   K 4.1 02/17/2014 1136   K 4.8 09/16/2013 0821   K 4.1 03/25/2013 0828   CL 104 02/17/2014 1136   CL 106 09/16/2013 0821   CL 107 03/25/2013 0828   CO2 30 02/17/2014 1136   CO2 29 09/16/2013 0821   CO2 27 03/25/2013 0828   BUN 16 02/17/2014 1136   BUN 23 09/16/2013 0821   BUN 12.7 03/25/2013 0828   CREATININE 1.1 02/17/2014 1136   CREATININE 1.08 09/16/2013 0821    CREATININE 0.9 03/25/2013 0828      Component Value Date/Time   CALCIUM 9.3 02/17/2014 1136   CALCIUM 9.5 09/16/2013 0821   CALCIUM 9.2 03/25/2013 0828   ALKPHOS 71 02/17/2014 1136   ALKPHOS 66 09/16/2013 0821   ALKPHOS 122 03/25/2013 0828   AST 28 02/17/2014 1136   AST 24 09/16/2013 0821   AST 33 03/25/2013 0828   ALT 26 02/17/2014 1136   ALT 26 09/16/2013 0821   ALT 65* 03/25/2013 0828   BILITOT 0.90 02/17/2014 1136   BILITOT 0.6 09/16/2013 0821   BILITOT 0.31 03/25/2013 0828         Impression and Plan: Mr. Semper is a 53 year old gentleman. He had recurrent Hodgkin's lymphoma. He got back into remission with salvage chemotherapy. This was followed by stem cell transplant. This was an autologous stem cell transplant. This was done at [facility] in May of 2014.  I believe that he is cured.  Am not sure that he would be a candidate for "maintenance" therapy. There are new studies which showed that post transplant, high-risk patients for recurrence benefit from therapy with Adcetris.  I will plan to see her back in 3 months.  I spent a good 45 is with him today. We really talked part and long about his family situation. She is getting counseling. This clearly has upset him.   He feels a sense of responsibility to provide to provide for his family.  I told him that we would be more than happy to help him in any way. Again, this was very tough for him to talk about his personal issues.         [name], MD 3/26/20157:28 AM

## 2014-03-03 ENCOUNTER — Telehealth: Payer: Self-pay | Admitting: *Deleted

## 2014-03-03 NOTE — Telephone Encounter (Signed)
Pt got a MyChart reminder that he needs to have a Tdap vaccination. He would like to know when his last shot was and if he really needs to have this done.  Please call [phone]

## 2014-03-03 NOTE — Telephone Encounter (Signed)
Spoke with pt and informed he had a tetanus vacc 04/10/2004 so he could come in to get a Tdap vaccination. He will come in sometime next week to have that done.

## 2014-03-23 ENCOUNTER — Other Ambulatory Visit: Payer: Self-pay | Admitting: Emergency Medicine

## 2014-03-23 MED ORDER — ALPRAZOLAM 0.5 MG PO TABS: 0.5000 mg | ORAL_TABLET | Freq: Every evening | ORAL | Status: DC | PRN

## 2014-03-24 ENCOUNTER — Other Ambulatory Visit: Payer: Self-pay | Admitting: Radiology

## 2014-03-24 NOTE — Telephone Encounter (Signed)
Faxed Rx to pharmacy patient advised/ pharmacy advised also patient should not take with the Ativan

## 2014-04-23 ENCOUNTER — Telehealth: Payer: Self-pay | Admitting: Emergency Medicine

## 2014-04-23 ENCOUNTER — Other Ambulatory Visit: Payer: Self-pay | Admitting: Emergency Medicine

## 2014-04-23 MED ORDER — ALPRAZOLAM 0.5 MG PO TABS: 0.5000 mg | ORAL_TABLET | Freq: Every evening | ORAL | Status: DC | PRN

## 2014-04-23 NOTE — Telephone Encounter (Signed)
faxed

## 2014-04-23 NOTE — Telephone Encounter (Signed)
Phone call regarding status. Still going through separation. Abnormal area seen on PET scan will be biopsied next week. Patient not suicidal because he has to small children. Will refill Xanax. Prescription sent by fax to [facility]

## 2014-05-06 ENCOUNTER — Other Ambulatory Visit: Payer: Self-pay | Admitting: Hematology & Oncology

## 2014-05-11 ENCOUNTER — Institutional Professional Consult (permissible substitution) (INDEPENDENT_AMBULATORY_CARE_PROVIDER_SITE_OTHER): Payer: Commercial Managed Care - PPO | Admitting: Thoracic Surgery (Cardiothoracic Vascular Surgery)

## 2014-05-11 ENCOUNTER — Encounter: Payer: Self-pay | Admitting: Thoracic Surgery (Cardiothoracic Vascular Surgery)

## 2014-05-11 ENCOUNTER — Other Ambulatory Visit: Payer: Self-pay | Admitting: *Deleted

## 2014-05-11 VITALS — BP 132/78 | HR 65 | Resp 16 | Ht 70.0 in | Wt 170.0 lb

## 2014-05-11 DIAGNOSIS — J929 Pleural plaque without asbestos: Secondary | ICD-10-CM

## 2014-05-11 DIAGNOSIS — J948 Other specified pleural conditions: Secondary | ICD-10-CM

## 2014-05-11 DIAGNOSIS — D381 Neoplasm of uncertain behavior of trachea, bronchus and lung: Secondary | ICD-10-CM

## 2014-05-11 DIAGNOSIS — C8190 Hodgkin lymphoma, unspecified, unspecified site: Secondary | ICD-10-CM

## 2014-05-11 DIAGNOSIS — R091 Pleurisy: Secondary | ICD-10-CM

## 2014-05-11 NOTE — Progress Notes (Signed)
PCP is DAUB, Lina Sayre, MD Referring Provider is [name], MD  Chief Complaint  Patient presents with  . Lymphoma    eval new lung nodule, pulmonary thickening per PET    HPI: 53 year old man with a history of relapsing nodular sclerosing Hodgkin's disease. He presents with a chief complaint of abnormal findings on his PET/CT.  Brian [name] is a 68 year old man who was diagnosed with nodular sclerosing Hodgkin's disease in 2013. He was treated and had an initial response. Unfortunately, he had a rapid recurrence. He underwent autologous stem cell transplant in May of 2014 at [facility]. He recently had a followup PET/CT at [facility]. It showed a hypermetabolic thickening of the pleura in the left chest that was FDG avid with an SUV of 4.2. There also were some mildly hypermetabolic paratracheal nodes that were normal size. An 8 mm right middle lobe nodule was also mildly hypermetabolic with an SUV of 2.3.  He had a CT-guided biopsy of the pleura at [facility] which was nondiagnostic.  He is now sent for consultation regarding possible thoracoscopic biopsy.  He says that he's been feeling well. Has had some discomfort in his left posterior chest since the needle biopsy but was not having any discomfort prior to that. He denies any chest pain or shortness of breath. He has not noted any enlarged lymph nodes.   Past Medical History  Diagnosis Date  . Wears contact lenses   . H/O autologous stem cell transplant     may 2014  at [facility]  . History of peptic ulcer   . Mass of right inguinal region   . History of Bell's palsy     2009  RIGHT SIDE--  HAS 80% FUNCTION / PT STATES A LITTLE ASYMETRICAL AND EFFECTS MOUTH  . Hodgkin's disease, nodular sclerosis, of inguinal region/lower limb ONOLOGIST--  DR [facility]NE[facility] AND A [facility]      SALVAGE CHEMO 2013/  AUTOLOGUS STEM CELL TRANSPLANT MAY 2014 AT [facility]    Past Surgical History  Procedure Laterality Date  . Axillary lymph node biopsy Left 02/02/2013    Procedure:  NEEDLE LOCALIZED AXILLARY LYMPH NODE BIOPSY;  Surgeon: Haywood Lasso, MD;  Location: [place];  Service: General;  Laterality: Left;  . Removal left neck cyst  1980  . Removal right inguinal lymph nodes  08-16-2011  . Left inguinal lymph node bx  09-09-2012  . Transthoracic echocardiogram  03-18-2013    MILD LVH/  EF 55-60%  . Scrotal exploration Right 01/04/2014    Procedure: SCROTUM EXPLORATION   INGUINAL , EXCISION OF CYSTIC MASS OF RIGHT SPERMATIC CORD, WITH FROZEN SECTION;  Surgeon: Franchot Gallo, MD;  Location: [facility];  Service: Urology;  Laterality: Right;    Family History  Problem Relation Age of Onset  . Cancer Mother 69    ovarian  . Heart disease Father   . Stroke Father   . Cancer Sister     ovarian  . Cancer Brother     testicular    Social History History  Substance Use Topics  . Smoking status: Never Smoker   . Smokeless tobacco: Never Used     Comment: never used tobacco  . Alcohol Use: Yes     Comment: OCCASIONAL    Current Outpatient Prescriptions  Medication Sig Dispense Refill  . acetaminophen (TYLENOL) 500 MG tablet Take 500 mg by mouth every 6 (six) hours as needed.      . ALPRAZolam (XANAX) 0.5 MG  tablet Take 1 tablet (0.5 mg total) by mouth at bedtime as needed for sleep.  30 tablet  2  . aspirin 325 MG tablet Take 325 mg by mouth as needed for pain.      . Cholecalciferol (VITAMIN D) 1000 UNITS capsule Take 1,000 Units by mouth 2 (two) times daily.       Marland Kitchen ibuprofen (ADVIL,MOTRIN) 200 MG tablet Take 200 mg by mouth every 6 (six) hours as needed.      Marland Kitchen LORazepam (ATIVAN) 1 MG tablet Take 1/2 tablet twice during the day as needed for anxiety and 1 tablet at bedtime as needed.  20 tablet  3  . Multiple Vitamin (MULTIVITAMIN) tablet Take 1 tablet by mouth daily.       No current facility-administered medications for this visit.    No Known Allergies  Review of Systems  Constitutional: Negative for  fever, activity change, appetite change and unexpected weight change.  Respiratory: Negative for cough.   Cardiovascular: Negative for chest pain.  Hematological: Does not bruise/bleed easily.  All other systems reviewed and are negative.   BP 132/78  Pulse 65  Resp 16  Ht 5\' 10"  (1.778 m)  Wt 170 lb (77.111 kg)  BMI 24.39 kg/m2  SpO2 98% Physical Exam  Vitals reviewed. Constitutional: He is oriented to person, place, and time. He appears well-developed and well-nourished. No distress.  HENT:  Head: Normocephalic and atraumatic.  Eyes: EOM are normal. Pupils are equal, round, and reactive to light.  Neck: Neck supple. No thyromegaly present.  Cardiovascular: Normal rate, regular rhythm and intact distal pulses.  Exam reveals no gallop and no friction rub.   No murmur heard. Pulmonary/Chest: Effort normal and breath sounds normal. He has no wheezes. He has no rales. He exhibits no tenderness.  Abdominal: Soft. There is no tenderness.  Musculoskeletal: He exhibits no edema.  Lymphadenopathy:    He has no cervical adenopathy.  Neurological: He is alert and oriented to person, place, and time. No cranial nerve deficit.  No focal motor deficit  Skin: Skin is warm and dry.  Psychiatric: He has a normal mood and affect.     Diagnostic Tests: PET/CT and report from [facility] reviewed  Impression: 54 year old male with a history of relapsing nodular sclerosing Hodgkin's disease who has a pleural based mass in the left chest that is markedly hypermetabolic on PET. Needle biopsy was nondiagnostic.  He does have other lesions including paratracheal nodes and a small right middle lobe nodule. These were mildly hypermetabolic by PET. However the largest lesion and the most hypermetabolic is the pleural based lesion so I think that would be the most likely to yield a definitive diagnosis.  I discussed the proposed procedure which is a left video-assisted thoracoscopy and pleural biopsy with  Brian [name]. We discussed the indications, risks, benefits, and alternatives. We discussed the general nature of the procedure including the use of general anesthesia, incisions to be used, expected hospital stay, and the overall recovery. He understands the risks include, but are not limited to death, MI, blood clots, bleeding, possible need for transfusion, infection. He understands there is no guarantee of a definitive diagnosis.  He has a trip to the beach planned with his children on the week of the Fourth of July. He will not be able to do that trip if he has surgery prior to that.  After discussion he wishes to have the procedure done on July 6  Plan:  Left video-assisted thoracoscopy, pleural  biopsy on Monday, July 6

## 2014-05-12 ENCOUNTER — Telehealth: Payer: Self-pay

## 2014-05-12 NOTE — Telephone Encounter (Signed)
Spoke with pt who reports he is scheduled for biopsy on 7/6 with Dr Roxan Hockey. Wanted to be sure Dr Ginette Pitman was aware. Also questioned if he should r/s his appt with Dr Marin Olp until after biopsy. Per Dr Marin Olp, r/s appt for 1-2 weeks after procedure. Pt transferred to [facility], scheduler for new appt. dph

## 2014-05-19 ENCOUNTER — Other Ambulatory Visit: Payer: Commercial Managed Care - PPO | Admitting: Lab

## 2014-05-19 ENCOUNTER — Ambulatory Visit: Payer: Commercial Managed Care - PPO | Admitting: Hematology & Oncology

## 2014-05-27 ENCOUNTER — Encounter (HOSPITAL_COMMUNITY)
Admission: RE | Admit: 2014-05-27 | Discharge: 2014-05-27 | Disposition: A | Discharge status: Home / Self Care | Payer: Commercial Managed Care - PPO | Source: Ambulatory Visit | Attending: Thoracic Surgery (Cardiothoracic Vascular Surgery) | Admitting: Thoracic Surgery (Cardiothoracic Vascular Surgery)

## 2014-05-27 ENCOUNTER — Other Ambulatory Visit: Payer: Self-pay

## 2014-05-27 ENCOUNTER — Encounter (HOSPITAL_COMMUNITY): Payer: Self-pay

## 2014-05-27 VITALS — BP 125/82 | HR 86 | Temp 98.3°F | Resp 18 | Ht 69.0 in | Wt 170.3 lb

## 2014-05-27 DIAGNOSIS — J948 Other specified pleural conditions: Secondary | ICD-10-CM

## 2014-05-27 DIAGNOSIS — Z01812 Encounter for preprocedural laboratory examination: Secondary | ICD-10-CM | POA: Insufficient documentation

## 2014-05-27 HISTORY — DX: Anxiety disorder, unspecified: F41.9

## 2014-05-27 HISTORY — DX: Cardiac arrhythmia, unspecified: I49.9

## 2014-05-27 LAB — COMPREHENSIVE METABOLIC PANEL
ALK PHOS: 90 U/L (ref 39–117)
ALT: 22 U/L (ref 0–53)
ANION GAP: 17 — AB (ref 5–15)
AST: 21 U/L (ref 0–37)
Albumin: 4.1 g/dL (ref 3.5–5.2)
BUN: 19 mg/dL (ref 6–23)
CO2: 20 mEq/L (ref 19–32)
Calcium: 9.6 mg/dL (ref 8.4–10.5)
Chloride: 101 mEq/L (ref 96–112)
Creatinine, Ser: 0.96 mg/dL (ref 0.50–1.35)
GFR calc Af Amer: >90 mL/min (ref >90)
GFR calc non Af Amer: >90 mL/min (ref >90)
Glucose, Bld: 104 mg/dL — ABNORMAL HIGH (ref 70–99)
POTASSIUM: 4.7 meq/L (ref 3.7–5.3)
SODIUM: 138 meq/L (ref 137–147)
TOTAL PROTEIN: 6.9 g/dL (ref 6.0–8.3)
Total Bilirubin: 0.5 mg/dL (ref 0.3–1.2)

## 2014-05-27 LAB — URINALYSIS, ROUTINE W REFLEX MICROSCOPIC
Bilirubin Urine: NEGATIVE
Glucose, UA: NEGATIVE mg/dL
HGB URINE DIPSTICK: NEGATIVE
KETONES UR: NEGATIVE mg/dL
Leukocytes, UA: NEGATIVE
Nitrite: NEGATIVE
PROTEIN: NEGATIVE mg/dL
Specific Gravity, Urine: 1.03 (ref 1.005–1.030)
UROBILINOGEN UA: 0.2 mg/dL (ref 0.0–1.0)
pH: 5 (ref 5.0–8.0)

## 2014-05-27 LAB — SURGICAL PCR SCREEN
MRSA, PCR: NEGATIVE
Staphylococcus aureus: NEGATIVE

## 2014-05-27 LAB — BLOOD GAS, ARTERIAL
Acid-base deficit: 0.1 mmol/L (ref 0.0–2.0)
Bicarbonate: 23.5 mEq/L (ref 20.0–24.0)
Drawn by: 206361
FIO2: 0.21 %
O2 Saturation: 97.5 %
PATIENT TEMPERATURE: 98.6
PH ART: 7.447 (ref 7.350–7.450)
TCO2: 24.5 mmol/L (ref 0–100)
pCO2 arterial: 34.5 mmHg — ABNORMAL LOW (ref 35.0–45.0)
pO2, Arterial: 87.2 mmHg (ref 80.0–100.0)

## 2014-05-27 LAB — PROTIME-INR
INR: 1.02 (ref 0.00–1.49)
Prothrombin Time: 13.4 seconds (ref 11.6–15.2)

## 2014-05-27 LAB — CBC
HCT: 40.5 % (ref 39.0–52.0)
Hemoglobin: 13.9 g/dL (ref 13.0–17.0)
MCH: 33.8 pg (ref 26.0–34.0)
MCHC: 34.3 g/dL (ref 30.0–36.0)
MCV: 98.5 fL (ref 78.0–100.0)
Platelets: 207 10*3/uL (ref 150–400)
RBC: 4.11 MIL/uL — ABNORMAL LOW (ref 4.22–5.81)
RDW: 12.6 % (ref 11.5–15.5)
WBC: 4.1 10*3/uL (ref 4.0–10.5)

## 2014-05-27 LAB — ABO/RH: ABO/RH(D): O POS

## 2014-05-27 LAB — TYPE AND SCREEN
ABO/RH(D): O POS
ANTIBODY SCREEN: NEGATIVE

## 2014-05-27 LAB — APTT: aPTT: 25 seconds (ref 24–37)

## 2014-05-30 MED ORDER — DEXTROSE 5 % IV SOLN
1.5000 g | INTRAVENOUS | Status: AC
  Administered 2014-05-31: 1.5 g via INTRAVENOUS
  Filled 2014-05-30: qty 1.5

## 2014-05-31 ENCOUNTER — Inpatient Hospital Stay (HOSPITAL_COMMUNITY)
Admission: RE | Admit: 2014-05-31 | Discharge: 2014-06-02 | DRG: 824 | Disposition: A | Discharge status: Home / Self Care | Payer: Commercial Managed Care - PPO | Source: Ambulatory Visit | Attending: Thoracic Surgery (Cardiothoracic Vascular Surgery) | Admitting: Thoracic Surgery (Cardiothoracic Vascular Surgery)

## 2014-05-31 ENCOUNTER — Inpatient Hospital Stay (HOSPITAL_COMMUNITY): Payer: Commercial Managed Care - PPO

## 2014-05-31 ENCOUNTER — Encounter (HOSPITAL_COMMUNITY)
Admission: RE | Disposition: A | Discharge status: Home / Self Care | Payer: Self-pay | Source: Ambulatory Visit | Attending: Thoracic Surgery (Cardiothoracic Vascular Surgery)

## 2014-05-31 ENCOUNTER — Inpatient Hospital Stay (HOSPITAL_COMMUNITY): Payer: Commercial Managed Care - PPO | Admitting: Anesthesiology

## 2014-05-31 ENCOUNTER — Encounter (HOSPITAL_COMMUNITY): Payer: Commercial Managed Care - PPO | Admitting: Anesthesiology

## 2014-05-31 ENCOUNTER — Encounter (HOSPITAL_COMMUNITY): Payer: Self-pay | Admitting: *Deleted

## 2014-05-31 DIAGNOSIS — R222 Localized swelling, mass and lump, trunk: Secondary | ICD-10-CM

## 2014-05-31 DIAGNOSIS — J9819 Other pulmonary collapse: Secondary | ICD-10-CM | POA: Diagnosis not present

## 2014-05-31 DIAGNOSIS — J948 Other specified pleural conditions: Secondary | ICD-10-CM | POA: Diagnosis present

## 2014-05-31 DIAGNOSIS — D62 Acute posthemorrhagic anemia: Secondary | ICD-10-CM | POA: Diagnosis not present

## 2014-05-31 DIAGNOSIS — C8112 Nodular sclerosis classical Hodgkin lymphoma, intrathoracic lymph nodes: Principal | ICD-10-CM | POA: Diagnosis present

## 2014-05-31 HISTORY — PX: VIDEO ASSISTED THORACOSCOPY (VATS)/WEDGE RESECTION: SHX6174

## 2014-05-31 HISTORY — DX: Ventricular premature depolarization: I49.3

## 2014-05-31 HISTORY — PX: PLEURA BIOPSY: SHX747

## 2014-05-31 HISTORY — PX: VIDEO ASSISTED THORACOSCOPY: SHX5073

## 2014-05-31 SURGERY — VIDEO ASSISTED THORACOSCOPY
Anesthesia: General | Site: Chest | Laterality: Left

## 2014-05-31 MED ORDER — NEOSTIGMINE METHYLSULFATE 10 MG/10ML IV SOLN
INTRAVENOUS | Status: AC
  Filled 2014-05-31: qty 1

## 2014-05-31 MED ORDER — KETOROLAC TROMETHAMINE 30 MG/ML IJ SOLN
INTRAMUSCULAR | Status: AC
  Filled 2014-05-31: qty 1

## 2014-05-31 MED ORDER — DEXTROSE-NACL 5-0.45 % IV SOLN
INTRAVENOUS | Status: DC
  Administered 2014-05-31: 17:00:00 via INTRAVENOUS

## 2014-05-31 MED ORDER — FENTANYL CITRATE 0.05 MG/ML IJ SOLN
INTRAMUSCULAR | Status: DC | PRN
  Administered 2014-05-31: 100 ug via INTRAVENOUS
  Administered 2014-05-31: 50 ug via INTRAVENOUS
  Administered 2014-05-31: 100 ug via INTRAVENOUS
  Administered 2014-05-31: 50 ug via INTRAVENOUS
  Administered 2014-05-31: 150 ug via INTRAVENOUS
  Administered 2014-05-31: 50 ug via INTRAVENOUS

## 2014-05-31 MED ORDER — OXYCODONE HCL 5 MG PO TABS
5.0000 mg | ORAL_TABLET | ORAL | Status: DC | PRN
  Filled 2014-05-31: qty 2

## 2014-05-31 MED ORDER — ADULT MULTIVITAMIN W/MINERALS CH
1.0000 | ORAL_TABLET | Freq: Every day | ORAL | Status: DC
  Administered 2014-06-01: 1 via ORAL
  Filled 2014-05-31 (×2): qty 1

## 2014-05-31 MED ORDER — NALOXONE HCL 0.4 MG/ML IJ SOLN: 0.4000 mg | INTRAMUSCULAR | Status: DC | PRN

## 2014-05-31 MED ORDER — ONDANSETRON HCL 4 MG/2ML IJ SOLN: 4.0000 mg | Freq: Once | INTRAMUSCULAR | Status: DC | PRN

## 2014-05-31 MED ORDER — SENNOSIDES-DOCUSATE SODIUM 8.6-50 MG PO TABS
1.0000 | ORAL_TABLET | Freq: Every day | ORAL | Status: DC
  Administered 2014-05-31 – 2014-06-01 (×2): 1 via ORAL
  Filled 2014-05-31 (×3): qty 1

## 2014-05-31 MED ORDER — ROCURONIUM BROMIDE 50 MG/5ML IV SOLN
INTRAVENOUS | Status: AC
  Filled 2014-05-31: qty 1

## 2014-05-31 MED ORDER — LIDOCAINE HCL 4 % MT SOLN
OROMUCOSAL | Status: DC | PRN
  Administered 2014-05-31: 4 mL via TOPICAL

## 2014-05-31 MED ORDER — LIDOCAINE HCL (CARDIAC) 20 MG/ML IV SOLN
INTRAVENOUS | Status: AC
  Filled 2014-05-31: qty 5

## 2014-05-31 MED ORDER — DIPHENHYDRAMINE HCL 12.5 MG/5ML PO ELIX
12.5000 mg | ORAL_SOLUTION | Freq: Four times a day (QID) | ORAL | Status: DC | PRN
  Filled 2014-05-31: qty 5

## 2014-05-31 MED ORDER — ONDANSETRON HCL 4 MG/2ML IJ SOLN
INTRAMUSCULAR | Status: DC | PRN
  Administered 2014-05-31: 4 mg via INTRAVENOUS

## 2014-05-31 MED ORDER — LIDOCAINE HCL (CARDIAC) 20 MG/ML IV SOLN
INTRAVENOUS | Status: DC | PRN
  Administered 2014-05-31: 70 mg via INTRAVENOUS

## 2014-05-31 MED ORDER — LORAZEPAM 0.5 MG PO TABS
0.5000 mg | ORAL_TABLET | Freq: Two times a day (BID) | ORAL | Status: DC | PRN
  Administered 2014-05-31 – 2014-06-01 (×2): 1 mg via ORAL
  Filled 2014-05-31 (×2): qty 2

## 2014-05-31 MED ORDER — DEXTROSE 5 % IV SOLN
1.5000 g | Freq: Two times a day (BID) | INTRAVENOUS | Status: AC
  Administered 2014-05-31 – 2014-06-01 (×2): 1.5 g via INTRAVENOUS
  Filled 2014-05-31 (×3): qty 1.5

## 2014-05-31 MED ORDER — HYDROMORPHONE HCL PF 1 MG/ML IJ SOLN
INTRAMUSCULAR | Status: AC
  Administered 2014-05-31: 0.5 mg via INTRAVENOUS
  Filled 2014-05-31: qty 1

## 2014-05-31 MED ORDER — ROCURONIUM BROMIDE 100 MG/10ML IV SOLN
INTRAVENOUS | Status: DC | PRN
  Administered 2014-05-31: 10 mg via INTRAVENOUS
  Administered 2014-05-31: 20 mg via INTRAVENOUS
  Administered 2014-05-31: 40 mg via INTRAVENOUS
  Administered 2014-05-31 (×2): 10 mg via INTRAVENOUS

## 2014-05-31 MED ORDER — KETOROLAC TROMETHAMINE 30 MG/ML IJ SOLN
30.0000 mg | Freq: Four times a day (QID) | INTRAMUSCULAR | Status: DC | PRN
  Administered 2014-05-31: 30 mg via INTRAVENOUS
  Filled 2014-05-31: qty 1

## 2014-05-31 MED ORDER — PHENYLEPHRINE HCL 10 MG/ML IJ SOLN
INTRAMUSCULAR | Status: AC
  Filled 2014-05-31: qty 1

## 2014-05-31 MED ORDER — ONDANSETRON HCL 4 MG/2ML IJ SOLN: 4.0000 mg | Freq: Four times a day (QID) | INTRAMUSCULAR | Status: DC | PRN

## 2014-05-31 MED ORDER — FENTANYL 10 MCG/ML IV SOLN
INTRAVENOUS | Status: DC
  Administered 2014-05-31: 15:00:00 via INTRAVENOUS
  Administered 2014-05-31: 180 ug via INTRAVENOUS
  Administered 2014-06-01: 12:00:00 via INTRAVENOUS
  Administered 2014-06-01: 75 ug via INTRAVENOUS
  Administered 2014-06-01: 30 ug via INTRAVENOUS
  Administered 2014-06-01: 45 ug via INTRAVENOUS
  Administered 2014-06-01: 75 ug via INTRAVENOUS
  Administered 2014-06-01: 60 ug via INTRAVENOUS
  Administered 2014-06-02: 15 ug via INTRAVENOUS
  Administered 2014-06-02 (×2): 30 ug via INTRAVENOUS
  Filled 2014-05-31 (×2): qty 50

## 2014-05-31 MED ORDER — POTASSIUM CHLORIDE 10 MEQ/50ML IV SOLN
10.0000 meq | Freq: Every day | INTRAVENOUS | Status: DC | PRN
  Filled 2014-05-31: qty 50

## 2014-05-31 MED ORDER — NEOSTIGMINE METHYLSULFATE 10 MG/10ML IV SOLN
INTRAVENOUS | Status: DC | PRN
  Administered 2014-05-31: 4 mg via INTRAVENOUS

## 2014-05-31 MED ORDER — PROPOFOL 10 MG/ML IV BOLUS
INTRAVENOUS | Status: AC
  Filled 2014-05-31: qty 20

## 2014-05-31 MED ORDER — PHENYLEPHRINE HCL 10 MG/ML IJ SOLN
INTRAMUSCULAR | Status: DC | PRN
  Administered 2014-05-31 (×3): 80 ug via INTRAVENOUS

## 2014-05-31 MED ORDER — ARTIFICIAL TEARS OP OINT
TOPICAL_OINTMENT | OPHTHALMIC | Status: AC
  Filled 2014-05-31: qty 3.5

## 2014-05-31 MED ORDER — ACETAMINOPHEN 500 MG PO TABS
1000.0000 mg | ORAL_TABLET | Freq: Four times a day (QID) | ORAL | Status: DC
  Administered 2014-05-31 – 2014-06-02 (×7): 1000 mg via ORAL
  Filled 2014-05-31 (×13): qty 2

## 2014-05-31 MED ORDER — 0.9 % SODIUM CHLORIDE (POUR BTL) OPTIME
TOPICAL | Status: DC | PRN
  Administered 2014-05-31 (×2): 1000 mL

## 2014-05-31 MED ORDER — GLYCOPYRROLATE 0.2 MG/ML IJ SOLN
INTRAMUSCULAR | Status: DC | PRN
  Administered 2014-05-31: .6 mg via INTRAVENOUS

## 2014-05-31 MED ORDER — EPHEDRINE SULFATE 50 MG/ML IJ SOLN
INTRAMUSCULAR | Status: AC
  Filled 2014-05-31: qty 1

## 2014-05-31 MED ORDER — ACETAMINOPHEN 160 MG/5ML PO SOLN
1000.0000 mg | Freq: Four times a day (QID) | ORAL | Status: DC
  Filled 2014-05-31: qty 40

## 2014-05-31 MED ORDER — BISACODYL 5 MG PO TBEC
10.0000 mg | DELAYED_RELEASE_TABLET | Freq: Every day | ORAL | Status: DC
  Administered 2014-06-01: 10 mg via ORAL
  Filled 2014-05-31 (×2): qty 2

## 2014-05-31 MED ORDER — METOCLOPRAMIDE HCL 5 MG/ML IJ SOLN
10.0000 mg | Freq: Four times a day (QID) | INTRAMUSCULAR | Status: AC
  Administered 2014-05-31 – 2014-06-01 (×4): 10 mg via INTRAVENOUS
  Filled 2014-05-31 (×4): qty 2

## 2014-05-31 MED ORDER — FENTANYL CITRATE 0.05 MG/ML IJ SOLN
INTRAMUSCULAR | Status: AC
  Filled 2014-05-31: qty 2

## 2014-05-31 MED ORDER — HYDROMORPHONE HCL PF 1 MG/ML IJ SOLN
0.2500 mg | INTRAMUSCULAR | Status: DC | PRN
  Administered 2014-05-31 (×2): 0.5 mg via INTRAVENOUS

## 2014-05-31 MED ORDER — FENTANYL CITRATE 0.05 MG/ML IJ SOLN
INTRAMUSCULAR | Status: AC
  Filled 2014-05-31: qty 5

## 2014-05-31 MED ORDER — SODIUM CHLORIDE 0.9 % IJ SOLN: 9.0000 mL | INTRAMUSCULAR | Status: DC | PRN

## 2014-05-31 MED ORDER — PROPOFOL 10 MG/ML IV BOLUS
INTRAVENOUS | Status: DC | PRN
  Administered 2014-05-31: 200 mg via INTRAVENOUS

## 2014-05-31 MED ORDER — GLYCOPYRROLATE 0.2 MG/ML IJ SOLN
INTRAMUSCULAR | Status: AC
  Filled 2014-05-31: qty 3

## 2014-05-31 MED ORDER — VITAMIN D 1000 UNITS PO TABS
1000.0000 [IU] | ORAL_TABLET | Freq: Every day | ORAL | Status: DC
  Administered 2014-06-01: 1000 [IU] via ORAL
  Filled 2014-05-31 (×2): qty 1

## 2014-05-31 MED ORDER — LACTATED RINGERS IV SOLN
INTRAVENOUS | Status: DC | PRN
  Administered 2014-05-31: 12:00:00 via INTRAVENOUS

## 2014-05-31 MED ORDER — DIPHENHYDRAMINE HCL 50 MG/ML IJ SOLN: 12.5000 mg | Freq: Four times a day (QID) | INTRAMUSCULAR | Status: DC | PRN

## 2014-05-31 MED ORDER — LACTATED RINGERS IV SOLN
INTRAVENOUS | Status: DC
  Administered 2014-05-31: 50 mL/h via INTRAVENOUS

## 2014-05-31 MED ORDER — FENTANYL CITRATE 0.05 MG/ML IJ SOLN
100.0000 ug | Freq: Once | INTRAMUSCULAR | Status: AC
  Administered 2014-05-31: 100 ug via INTRAVENOUS

## 2014-05-31 SURGICAL SUPPLY — 75 items
APPLIER CLIP ROT 10 11.4 M/L (STAPLE) ×4
CANISTER SUCTION 2500CC (MISCELLANEOUS) ×4 IMPLANT
CATH KIT ON Q 5IN SLV (PAIN MANAGEMENT) IMPLANT
CATH THORACIC 28FR (CATHETERS) IMPLANT
CATH THORACIC 28FR RT ANG (CATHETERS) IMPLANT
CATH THORACIC 36FR (CATHETERS) IMPLANT
CATH THORACIC 36FR RT ANG (CATHETERS) IMPLANT
CLIP APPLIE ROT 10 11.4 M/L (STAPLE) ×2 IMPLANT
CLIP TI MEDIUM 6 (CLIP) ×4 IMPLANT
CONN ST 1/4X3/8  BEN (MISCELLANEOUS) ×2
CONN ST 1/4X3/8 BEN (MISCELLANEOUS) ×2 IMPLANT
CONN Y 3/8X3/8X3/8  BEN (MISCELLANEOUS)
CONN Y 3/8X3/8X3/8 BEN (MISCELLANEOUS) IMPLANT
CONT SPEC 4OZ CLIKSEAL STRL BL (MISCELLANEOUS) ×8 IMPLANT
COVER SURGICAL LIGHT HANDLE (MISCELLANEOUS) ×4 IMPLANT
DERMABOND ADVANCED (GAUZE/BANDAGES/DRESSINGS) ×2
DERMABOND ADVANCED .7 DNX12 (GAUZE/BANDAGES/DRESSINGS) ×2 IMPLANT
DRAIN CHANNEL 28F RND 3/8 FF (WOUND CARE) ×4 IMPLANT
DRAPE LAPAROSCOPIC ABDOMINAL (DRAPES) IMPLANT
DRAPE WARM FLUID 44X44 (DRAPE) ×4 IMPLANT
ELECT REM PT RETURN 9FT ADLT (ELECTROSURGICAL) ×4
ELECTRODE REM PT RTRN 9FT ADLT (ELECTROSURGICAL) ×2 IMPLANT
GLOVE BIO SURGEON STRL SZ 6 (GLOVE) ×8 IMPLANT
GLOVE BIO SURGEON STRL SZ 6.5 (GLOVE) ×3 IMPLANT
GLOVE BIO SURGEONS STRL SZ 6.5 (GLOVE) ×1
GLOVE BIOGEL PI IND STRL 6.5 (GLOVE) ×14 IMPLANT
GLOVE BIOGEL PI INDICATOR 6.5 (GLOVE) ×14
GLOVE ECLIPSE 6.5 STRL STRAW (GLOVE) ×8 IMPLANT
GLOVE SURG SIGNA 7.5 PF LTX (GLOVE) ×4 IMPLANT
GOWN STRL REUS W/ TWL LRG LVL3 (GOWN DISPOSABLE) ×6 IMPLANT
GOWN STRL REUS W/ TWL XL LVL3 (GOWN DISPOSABLE) ×4 IMPLANT
GOWN STRL REUS W/TWL LRG LVL3 (GOWN DISPOSABLE) ×6
GOWN STRL REUS W/TWL XL LVL3 (GOWN DISPOSABLE) ×4
HEMOSTAT SURGICEL 2X14 (HEMOSTASIS) IMPLANT
KIT BASIN OR (CUSTOM PROCEDURE TRAY) ×4 IMPLANT
KIT ROOM TURNOVER OR (KITS) ×4 IMPLANT
NS IRRIG 1000ML POUR BTL (IV SOLUTION) ×8 IMPLANT
PACK CHEST (CUSTOM PROCEDURE TRAY) ×4 IMPLANT
PAD ARMBOARD 7.5X6 YLW CONV (MISCELLANEOUS) ×8 IMPLANT
POUCH ENDO CATCH II 15MM (MISCELLANEOUS) IMPLANT
POUCH SPECIMEN RETRIEVAL 10MM (ENDOMECHANICALS) IMPLANT
SEALANT PROGEL (MISCELLANEOUS) IMPLANT
SEALANT SURG COSEAL 4ML (VASCULAR PRODUCTS) IMPLANT
SEALANT SURG COSEAL 8ML (VASCULAR PRODUCTS) IMPLANT
SOLUTION ANTI FOG 6CC (MISCELLANEOUS) ×4 IMPLANT
SPECIMEN JAR MEDIUM (MISCELLANEOUS) IMPLANT
SPONGE GAUZE 4X4 12PLY (GAUZE/BANDAGES/DRESSINGS) ×4 IMPLANT
SPONGE GAUZE 4X4 12PLY STER LF (GAUZE/BANDAGES/DRESSINGS) ×4 IMPLANT
SPONGE INTESTINAL PEANUT (DISPOSABLE) IMPLANT
SUT PROLENE 4 0 RB 1 (SUTURE)
SUT PROLENE 4-0 RB1 .5 CRCL 36 (SUTURE) IMPLANT
SUT SILK  1 MH (SUTURE) ×4
SUT SILK 1 MH (SUTURE) ×4 IMPLANT
SUT SILK 2 0SH CR/8 30 (SUTURE) IMPLANT
SUT SILK 3 0SH CR/8 30 (SUTURE) IMPLANT
SUT VIC AB 1 CTX 36 (SUTURE) ×2
SUT VIC AB 1 CTX36XBRD ANBCTR (SUTURE) ×2 IMPLANT
SUT VIC AB 2-0 CTX 36 (SUTURE) ×4 IMPLANT
SUT VIC AB 2-0 UR6 27 (SUTURE) IMPLANT
SUT VIC AB 3-0 MH 27 (SUTURE) IMPLANT
SUT VIC AB 3-0 X1 27 (SUTURE) ×4 IMPLANT
SUT VICRYL 2 TP 1 (SUTURE) IMPLANT
SWAB COLLECTION DEVICE MRSA (MISCELLANEOUS) IMPLANT
SYSTEM SAHARA CHEST DRAIN ATS (WOUND CARE) ×4 IMPLANT
TAPE CLOTH 4X10 WHT NS (GAUZE/BANDAGES/DRESSINGS) ×4 IMPLANT
TAPE CLOTH SURG 4X10 WHT LF (GAUZE/BANDAGES/DRESSINGS) ×4 IMPLANT
TIP APPLICATOR SPRAY EXTEND 16 (VASCULAR PRODUCTS) IMPLANT
TOWEL OR 17X24 6PK STRL BLUE (TOWEL DISPOSABLE) ×4 IMPLANT
TOWEL OR 17X26 10 PK STRL BLUE (TOWEL DISPOSABLE) ×8 IMPLANT
TRAP SPECIMEN MUCOUS 40CC (MISCELLANEOUS) IMPLANT
TRAY FOLEY CATH 16FRSI W/METER (SET/KITS/TRAYS/PACK) ×4 IMPLANT
TROCAR XCEL NON-BLD 5MMX100MML (ENDOMECHANICALS) IMPLANT
TUBE ANAEROBIC SPECIMEN COL (MISCELLANEOUS) IMPLANT
TUNNELER SHEATH ON-Q 11GX8 DSP (PAIN MANAGEMENT) IMPLANT
WATER STERILE IRR 1000ML POUR (IV SOLUTION) ×8 IMPLANT

## 2014-05-31 NOTE — Anesthesia Procedure Notes (Signed)
Procedure Name: Intubation Date/Time: 05/31/2014 12:10 PM Performed by: Suzy Bouchard Pre-anesthesia Checklist: Patient identified, Emergency Drugs available, Suction available, Patient being monitored and Timeout performed Patient Re-evaluated:Patient Re-evaluated prior to inductionOxygen Delivery Method: Circle system utilized Preoxygenation: Pre-oxygenation with 100% oxygen Intubation Type: IV induction Ventilation: Mask ventilation without difficulty Grade View: Grade I Tube type: Oral Endobronchial tube: Left, Double lumen EBT, EBT position confirmed by auscultation and EBT position confirmed by fiberoptic bronchoscope and 39 Fr Number of attempts: 1 Airway Equipment and Method: Stylet Placement Confirmation: ETT inserted through vocal cords under direct vision,  positive ETCO2,  CO2 detector and breath sounds checked- equal and bilateral Tube secured with: Tape Dental Injury: Teeth and Oropharynx as per pre-operative assessment

## 2014-05-31 NOTE — Progress Notes (Signed)
S/p resection of pleural mass  BP 126/72  Pulse 77  Temp(Src) 97.8 F (36.6 C) (Oral)  Resp 17  Wt 170 lb (77.111 kg)  SpO2 97%   Intake/Output Summary (Last 24 hours) at 05/31/14 1813 Last data filed at 05/31/14 1600  Gross per 24 hour  Intake   1000 ml  Output    350 ml  Net    650 ml    Up in chair   Ate some dinner  Doing well

## 2014-05-31 NOTE — H&P (View-Only) (Signed)
PCP is DAUB, Lina Sayre, MD Referring Provider is [name], MD  Chief Complaint  Patient presents with  . Lymphoma    eval new lung nodule, pulmonary thickening per PET    HPI: 53 year old man with a history of relapsing nodular sclerosing Hodgkin's disease. He presents with a chief complaint of abnormal findings on his PET/CT.  Brian [name] is a 1 year old man who was diagnosed with nodular sclerosing Hodgkin's disease in 2013. He was treated and had an initial response. Unfortunately, he had a rapid recurrence. He underwent autologous stem cell transplant in May of 2014 at [facility]. He recently had a followup PET/CT at [facility]. It showed a hypermetabolic thickening of the pleura in the left chest that was FDG avid with an SUV of 4.2. There also were some mildly hypermetabolic paratracheal nodes that were normal size. An 8 mm right middle lobe nodule was also mildly hypermetabolic with an SUV of 2.3.  He had a CT-guided biopsy of the pleura at [facility] which was nondiagnostic.  He is now sent for consultation regarding possible thoracoscopic biopsy.  He says that he's been feeling well. Has had some discomfort in his left posterior chest since the needle biopsy but was not having any discomfort prior to that. He denies any chest pain or shortness of breath. He has not noted any enlarged lymph nodes.   Past Medical History  Diagnosis Date  . Wears contact lenses   . H/O autologous stem cell transplant     may 2014  at [facility]  . History of peptic ulcer   . Mass of right inguinal region   . History of Bell's palsy     2009  RIGHT SIDE--  HAS 80% FUNCTION / PT STATES A LITTLE ASYMETRICAL AND EFFECTS MOUTH  . Hodgkin's disease, nodular sclerosis, of inguinal region/lower limb ONOLOGIST--  [facility] AND [facility]      SALVAGE CHEMO 2013/  AUTOLOGUS STEM CELL TRANSPLANT MAY 2014 AT [facility]    Past Surgical History  Procedure Laterality Date  . Axillary lymph node biopsy Left 02/02/2013    Procedure:  NEEDLE LOCALIZED AXILLARY LYMPH NODE BIOPSY;  Surgeon: Haywood Lasso, MD;  Location: [place];  Service: General;  Laterality: Left;  . Removal left neck cyst  1980  . Removal right inguinal lymph nodes  08-16-2011  . Left inguinal lymph node bx  09-09-2012  . Transthoracic echocardiogram  03-18-2013    MILD LVH/  EF 55-60%  . Scrotal exploration Right 01/04/2014    Procedure: SCROTUM EXPLORATION   INGUINAL , EXCISION OF CYSTIC MASS OF RIGHT SPERMATIC CORD, WITH FROZEN SECTION;  Surgeon: Franchot Gallo, MD;  Location: [facility];  Service: Urology;  Laterality: Right;    Family History  Problem Relation Age of Onset  . Cancer Mother 67    ovarian  . Heart disease Father   . Stroke Father   . Cancer Sister     ovarian  . Cancer Brother     testicular    Social History History  Substance Use Topics  . Smoking status: Never Smoker   . Smokeless tobacco: Never Used     Comment: never used tobacco  . Alcohol Use: Yes     Comment: OCCASIONAL    Current Outpatient Prescriptions  Medication Sig Dispense Refill  . acetaminophen (TYLENOL) 500 MG tablet Take 500 mg by mouth every 6 (six) hours as needed.      . ALPRAZolam (XANAX) 0.5 MG  tablet Take 1 tablet (0.5 mg total) by mouth at bedtime as needed for sleep.  30 tablet  2  . aspirin 325 MG tablet Take 325 mg by mouth as needed for pain.      . Cholecalciferol (VITAMIN D) 1000 UNITS capsule Take 1,000 Units by mouth 2 (two) times daily.       Marland Kitchen ibuprofen (ADVIL,MOTRIN) 200 MG tablet Take 200 mg by mouth every 6 (six) hours as needed.      Marland Kitchen LORazepam (ATIVAN) 1 MG tablet Take 1/2 tablet twice during the day as needed for anxiety and 1 tablet at bedtime as needed.  20 tablet  3  . Multiple Vitamin (MULTIVITAMIN) tablet Take 1 tablet by mouth daily.       No current facility-administered medications for this visit.    No Known Allergies  Review of Systems  Constitutional: Negative for  fever, activity change, appetite change and unexpected weight change.  Respiratory: Negative for cough.   Cardiovascular: Negative for chest pain.  Hematological: Does not bruise/bleed easily.  All other systems reviewed and are negative.   BP 132/78  Pulse 65  Resp 16  Ht 5\' 10"  (1.778 m)  Wt 170 lb (77.111 kg)  BMI 24.39 kg/m2  SpO2 98% Physical Exam  Vitals reviewed. Constitutional: He is oriented to person, place, and time. He appears well-developed and well-nourished. No distress.  HENT:  Head: Normocephalic and atraumatic.  Eyes: EOM are normal. Pupils are equal, round, and reactive to light.  Neck: Neck supple. No thyromegaly present.  Cardiovascular: Normal rate, regular rhythm and intact distal pulses.  Exam reveals no gallop and no friction rub.   No murmur heard. Pulmonary/Chest: Effort normal and breath sounds normal. He has no wheezes. He has no rales. He exhibits no tenderness.  Abdominal: Soft. There is no tenderness.  Musculoskeletal: He exhibits no edema.  Lymphadenopathy:    He has no cervical adenopathy.  Neurological: He is alert and oriented to person, place, and time. No cranial nerve deficit.  No focal motor deficit  Skin: Skin is warm and dry.  Psychiatric: He has a normal mood and affect.     Diagnostic Tests: PET/CT and report from [facility] reviewed  Impression: 53 year old male with a history of relapsing nodular sclerosing Hodgkin's disease who has a pleural based mass in the left chest that is markedly hypermetabolic on PET. Needle biopsy was nondiagnostic.  He does have other lesions including paratracheal nodes and a small right middle lobe nodule. These were mildly hypermetabolic by PET. However the largest lesion and the most hypermetabolic is the pleural based lesion so I think that would be the most likely to yield a definitive diagnosis.  I discussed the proposed procedure which is a left video-assisted thoracoscopy and pleural biopsy with  Brian [name]. We discussed the indications, risks, benefits, and alternatives. We discussed the general nature of the procedure including the use of general anesthesia, incisions to be used, expected hospital stay, and the overall recovery. He understands the risks include, but are not limited to death, MI, blood clots, bleeding, possible need for transfusion, infection. He understands there is no guarantee of a definitive diagnosis.  He has a trip to the beach planned with his children on the week of the Fourth of July. He will not be able to do that trip if he has surgery prior to that.  After discussion he wishes to have the procedure done on July 6  Plan:  Left video-assisted thoracoscopy, pleural  biopsy on Monday, July 6

## 2014-05-31 NOTE — Brief Op Note (Addendum)
05/31/2014  2:04 PM  PATIENT:  Brian [name]  53 y.o. male  PRE-OPERATIVE DIAGNOSIS:  LEFT PLEURAL MASS  POST-OPERATIVE DIAGNOSIS:  LEFT PLEURAL MASS, PROBABLE HODGKIN'S LYMPHOMA  PROCEDURE:  Procedure(s):  LEFT VIDEO ASSISTED THORACOSCOPY -Resection of left pleural mass  SURGEON:  Surgeon(s) and Role:    * Melrose Nakayama, MD - Primary  PHYSICIAN ASSISTANT: Ellwood Handler PA-C  ANESTHESIA:   general  EBL:  Total I/O In: -  Out: 275 [Urine:125; Blood:150]  BLOOD ADMINISTERED:none  DRAINS: 28 [address] Left Chest   LOCAL MEDICATIONS USED:  NONE  SPECIMEN:  Source of Specimen:  Pleural mass  DISPOSITION OF SPECIMEN:  PATHOLOGY  COUNTS:  YES  PLAN OF CARE: Admit to inpatient   PATIENT DISPOSITION:  PACU - hemodynamically stable.   Delay start of Pharmacological VTE agent (>24hrs) due to surgical blood loss or risk of bleeding: yes  Frozen showed likely Hodgkin's lymphoma

## 2014-05-31 NOTE — Transfer of Care (Signed)
Immediate Anesthesia Transfer of Care Note  Patient: Brian [name]  Procedure(s) Performed: Procedure(s): LEFT VIDEO ASSISTED THORACOSCOPY, PLEURAL BIOPSY (Left)  Patient Location: PACU  Anesthesia Type:General  Level of Consciousness: awake and alert   Airway & Oxygen Therapy: Patient Spontanous Breathing and Patient connected to nasal cannula oxygen  Post-op Assessment: Report given to PACU RN, Post -op Vital signs reviewed and stable and Patient moving all extremities  Post vital signs: Reviewed and stable  Complications: No apparent anesthesia complications

## 2014-05-31 NOTE — Interval H&P Note (Signed)
History and Physical Interval Note:  05/31/2014 11:24 AM  Brian [name]  has presented today for surgery, with the diagnosis of LEFT PLEURAL MASS  The various methods of treatment have been discussed with the patient and family. After consideration of risks, benefits and other options for treatment, the patient has consented to  Procedure(s): VIDEO ASSISTED THORACOSCOPY (Left) PLEURAL BIOPSY (Left) as a surgical intervention .  The patient's history has been reviewed, patient examined, no change in status, stable for surgery.  I have reviewed the patient's chart and labs.  Questions were answered to the patient's satisfaction.     HENDRICKSON,STEVEN C

## 2014-05-31 NOTE — Progress Notes (Signed)
Intraoperative images ° ° ° ° °

## 2014-05-31 NOTE — Anesthesia Preprocedure Evaluation (Addendum)
Anesthesia Evaluation  Patient identified by MRN, date of birth, ID band Patient awake    Reviewed: Allergy & Precautions, H&P , NPO status , Patient's Chart, lab work & pertinent test results  Airway       Dental   Pulmonary          Cardiovascular + dysrhythmias     Neuro/Psych    GI/Hepatic   Endo/Other    Renal/GU  Bladder dysfunction: Bell's palsy       Musculoskeletal   Abdominal   Peds  Hematology   Anesthesia Other Findings Bell's palsy Hodgkin's  Reproductive/Obstetrics                           Anesthesia Physical Anesthesia Plan  ASA: II  Anesthesia Plan: General   Post-op Pain Management:    Induction: Intravenous  Airway Management Planned: Oral ETT and Double Lumen EBT  Additional Equipment: Arterial line and CVP  Intra-op Plan:   Post-operative Plan: Possible Post-op intubation/ventilation and Extubation in OR  Informed Consent: I have reviewed the patients History and Physical, chart, labs and discussed the procedure including the risks, benefits and alternatives for the proposed anesthesia with the patient or authorized representative who has indicated his/her understanding and acceptance.     Plan Discussed with:   Anesthesia Plan Comments:         Anesthesia Quick Evaluation

## 2014-06-01 ENCOUNTER — Inpatient Hospital Stay (HOSPITAL_COMMUNITY): Payer: Commercial Managed Care - PPO

## 2014-06-01 ENCOUNTER — Encounter (HOSPITAL_COMMUNITY): Payer: Self-pay | Admitting: General Practice

## 2014-06-01 LAB — BASIC METABOLIC PANEL
Anion gap: 13 (ref 5–15)
BUN: 18 mg/dL (ref 6–23)
CALCIUM: 8.9 mg/dL (ref 8.4–10.5)
CO2: 25 mEq/L (ref 19–32)
Chloride: 99 mEq/L (ref 96–112)
Creatinine, Ser: 0.88 mg/dL (ref 0.50–1.35)
GFR calc Af Amer: >90 mL/min (ref >90)
GLUCOSE: 112 mg/dL — AB (ref 70–99)
Potassium: 3.9 mEq/L (ref 3.7–5.3)
SODIUM: 137 meq/L (ref 137–147)

## 2014-06-01 LAB — POCT I-STAT 3, ART BLOOD GAS (G3+)
Acid-Base Excess: 2 mmol/L (ref 0.0–2.0)
Bicarbonate: 26.9 mEq/L — ABNORMAL HIGH (ref 20.0–24.0)
O2 Saturation: 96 %
PCO2 ART: 43.9 mmHg (ref 35.0–45.0)
PH ART: 7.396 (ref 7.350–7.450)
TCO2: 28 mmol/L (ref 0–100)
pO2, Arterial: 84 mmHg (ref 80.0–100.0)

## 2014-06-01 LAB — CBC
HCT: 33.5 % — ABNORMAL LOW (ref 39.0–52.0)
Hemoglobin: 11.3 g/dL — ABNORMAL LOW (ref 13.0–17.0)
MCH: 32.9 pg (ref 26.0–34.0)
MCHC: 33.7 g/dL (ref 30.0–36.0)
MCV: 97.7 fL (ref 78.0–100.0)
Platelets: 168 10*3/uL (ref 150–400)
RBC: 3.43 MIL/uL — ABNORMAL LOW (ref 4.22–5.81)
RDW: 12.7 % (ref 11.5–15.5)
WBC: 6.7 10*3/uL (ref 4.0–10.5)

## 2014-06-01 MED ORDER — ASPIRIN 325 MG PO TABS
325.0000 mg | ORAL_TABLET | Freq: Every day | ORAL | Status: DC | PRN
  Filled 2014-06-01: qty 1

## 2014-06-01 MED ORDER — ENOXAPARIN SODIUM 40 MG/0.4ML ~~LOC~~ SOLN
40.0000 mg | SUBCUTANEOUS | Status: DC
  Administered 2014-06-01: 40 mg via SUBCUTANEOUS
  Filled 2014-06-01 (×2): qty 0.4

## 2014-06-01 NOTE — Op Note (Signed)
NAMEZACKARI, RUANE                  ACCOUNT NO.:  [id number]  MEDICAL RECORD NO.:  [id number]  LOCATION:  2S02C                        FACILITY:  [facility]  PHYSICIAN:  Revonda Standard. Roxan Hockey, M.D.DATE OF BIRTH:  1961-09-02  DATE OF PROCEDURE:  05/31/2014 DATE OF DISCHARGE:                              OPERATIVE REPORT   PREOPERATIVE DIAGNOSIS:  Left pleural mass.  POSTOPERATIVE DIAGNOSIS:  Left pleural mass, probable Hodgkin's lymphoma.  PROCEDURE:  Left video-assisted thoracoscopy and resection of left pleural mass.  SURGEON:  Revonda Standard. Roxan Hockey, MD  ASSISTANT:  Ellwood Handler, PA-C.  ANESTHESIA:  General.  FINDINGS:  Amorphous mass at approximately the seventh intercostal space posterolaterally in the left chest.  Frozen section revealed likely Hodgkin's lymphoma.  CLINICAL NOTE:  Mr. Tokunaga is a 53 year old man with a history of relapsing nodular sclerosing Hodgkin's disease.  He had been treated with an autologous stem cell transplant approximately a year ago. He recently had a followup PET-CT done at [facility] which showed a hypermetabolic thickening of the pleura in the left chest.  There were some mildly hypermetabolic paratracheal nodes, but they were normal in size, there was an 8 mm right middle lobe nodule that was mildly hypermetabolic with an SUV of 2.3.  A CT-guided biopsy of the pleura at [facility] was nondiagnostic, he was now sent for consultation regarding thoracoscopic biopsy of the pleural mass.  The patient was advised to undergo thoracoscopy with biopsy and possible resection of the mass.  The indications, risks, benefits, and alternatives were discussed in detail with the patient.  He understood and accepted the risks and agreed to proceed.  OPERATIVE NOTE:  Mr. Kucinski was brought to the preoperative holding area on May 31, 2014.  The Anesthesia Service placed a central line and arterial blood pressure monitoring line.  He was taken to the operating room,  anesthetized, and intubated with a double-lumen endotracheal tube. A Foley catheter was placed.  Sequential compression devices were placed for DVT prophylaxis.  He was placed in a right lateral decubitus position.  Single lung ventilation of the right lung was initiated and was tolerated well throughout the procedure. The left chest was prepped and draped in the usual sterile fashion.  An incision was made in the seventh intercostal space in the midaxillary line.  It was carried through the skin and subcutaneous tissue.  The chest was entered bluntly using a 5 mm port.  The thoracoscope was advanced into the chest.  There was no pleural effusion.  A small working incision was made in approximately the fifth interspace anterolaterally.  No rib spreading was performed during the procedure.  The lung was retracted anteriorly and there was an obvious confluent mass on the posterolateral aspect of the pleura at approximately the seventh intercostal space.  The plan was to take as much of this as could be done safely to provide as much tissue as possible for pathologic examination.  The pleura was scored around the mass using electrocautery.  The mass then was grasped and initially, a good plane developed between the mass and the chest wall. As the dissection was carried further posterolaterally, there were extensions of the mass  into the intercostal spaces and in and around the intercostal vessels. There was area where a formal chest wall resection would have been necessary to get 100% removal of the mass. This was not indicated if the mass turned out to be a lymphoma.  While retracting the mass, a portion of the mass came off. This was sent for frozen section while the remainder of the mass was taken down.  A large intercostal artery was identified, it was encased by the mass, it was clipped and divided both at the proximal and distal ends. The frozen section returned showing probable  Hodgkin's lymphoma.  At this point, it was not necessary to achieve a clear margin of resection and the mass was transected and removed and sent for further pathologic study. It is estimated that 95% of the tumor was removed.  An inspection was made for hemostasis. The chest was copiously irrigated with warm saline.  A 28-French Blake drain was placed through the original port incision and directed posteriorly.  Clips were placed at the site of the resection anteriorly, posteriorly, superiorly, and inferiorly, in case additional radiation is indicated to that area.  The left lung was reinflated.  The incision was closed with a running #1 Vicryl fascial suture followed by standard subcutaneous and subcuticular closures.  Dermabond was applied to the wound.  The chest tube was dressed.  The patient was placed back in a supine position.  The chest tube was placed to suction.  The patient was extubated in the operating room and taken to the postanesthetic care unit in good condition.     Revonda Standard Roxan Hockey, M.D.     SCH/MEDQ  D:  05/31/2014  T:  06/01/2014  Job:  [id number]

## 2014-06-01 NOTE — Progress Notes (Signed)
1 Day Post-Op Procedure(s) (LRB): LEFT VIDEO ASSISTED THORACOSCOPY, PLEURAL BIOPSY (Left) Subjective: Some incisional pain No nausea  Objective: Vital signs in last 24 hours: Temp:  [97.6 F (36.4 [name])-98.3 F (36.8 [name])] 98.2 F (36.8 [name]) (07/07 0718) Pulse Rate:  [63-99] 73 (07/07 0800) Cardiac Rhythm:  [-] Normal sinus rhythm (07/07 0800) Resp:  [10-22] 12 (07/07 0800) BP: (101-140)/(58-89) 107/58 mmHg (07/07 0800) SpO2:  [95 %-99 %] 99 % (07/07 0800) Arterial Line BP: (120-165)/(53-79) 127/55 mmHg (07/07 0800) Weight:  [170 lb (77.111 kg)-176 lb 9.4 oz (80.1 kg)] 176 lb 9.4 oz (80.1 kg) (07/07 0300)  Hemodynamic parameters for last 24 hours:    Intake/Output from previous day: 07/06 0701 - 07/07 0700 In: 2590.2 [P.O.:200; I.V.:2290.2; IV Piggyback:100] Out: 2075 [Urine:1765; Blood:150; Chest Tube:160] Intake/Output this shift: Total I/O In: 100 [I.V.:100] Out: 430 [Urine:380; Chest Tube:50]  General appearance: alert and no distress Neurologic: intact Heart: regular rate and rhythm Lungs: clear to auscultation bilaterally Abdomen: normal findings: soft, non-tender no air leak, minimal serous drainage  Lab Results:  Recent Labs  06/01/14 0415  WBC 6.7  HGB 11.3*  HCT 33.5*  PLT 168   BMET:  Recent Labs  06/01/14 0415  NA 137  K 3.9  CL 99  CO2 25  GLUCOSE 112*  BUN 18  CREATININE 0.88  CALCIUM 8.9    PT/INR: No results found for this basename: LABPROT, INR,  in the last 72 hours ABG    Component Value Date/Time   PHART 7.396 06/01/2014 0410   HCO3 26.9* 06/01/2014 0410   TCO2 28 06/01/2014 0410   ACIDBASEDEF 0.1 05/27/2014 1019   O2SAT 96.0 06/01/2014 0410   CBG (last 3)  No results found for this basename: GLUCAP,  in the last 72 hours  Assessment/Plan: S/P Procedure(s) (LRB): LEFT VIDEO ASSISTED THORACOSCOPY, PLEURAL BIOPSY (Left) Plan for transfer to step-down: see transfer orders  POD # 1 Resection of pleural mass Looks great No air leak and  minimal drainage- will dc ct CXR shows mild left basilar atelectasis OOB, ambulate SCD + enoxaparin for DVT prophylaxis Anemia secondary to ABL- mild, follow DC a line, foley, central line Continue PCA today Transfer to 2000    LOS: 1 day    Brian [name] 06/01/2014

## 2014-06-01 NOTE — Progress Notes (Signed)
Attempted to call report to Sharyn Lull, [place] for room 2W03 but unsuccessful. Gave secretary my number and will call back in 5 min as well. Richardean Sale, RN

## 2014-06-01 NOTE — Anesthesia Postprocedure Evaluation (Signed)
  Anesthesia Post-op Note  Patient: Brian [name]  Procedure(s) Performed: Procedure(s): LEFT VIDEO ASSISTED THORACOSCOPY, PLEURAL BIOPSY (Left)  Patient Location: PACU  Anesthesia Type:General  Level of Consciousness: awake, alert , oriented and patient cooperative  Airway and Oxygen Therapy: Patient Spontanous Breathing  Post-op Pain: moderate  Post-op Assessment: Post-op Vital signs reviewed, Patient's Cardiovascular Status Stable, Respiratory Function Stable, Patent Airway, No signs of Nausea or vomiting and Pain level controlled  Post-op Vital Signs: stable  Last Vitals:  Filed Vitals:   06/01/14 0600  BP: 101/63  Pulse: 73  Temp:   Resp: 14    Complications: No apparent anesthesia complications

## 2014-06-01 NOTE — Progress Notes (Signed)
RN for 2W03 taking care of patient in distress along with charge RN and unable to take report. 2S charge RN aware and will be waiting for phone call. Richardean Sale, RN

## 2014-06-02 ENCOUNTER — Telehealth: Payer: Self-pay | Admitting: Hematology & Oncology

## 2014-06-02 ENCOUNTER — Inpatient Hospital Stay (HOSPITAL_COMMUNITY): Payer: Commercial Managed Care - PPO

## 2014-06-02 LAB — COMPREHENSIVE METABOLIC PANEL
ALT: 20 U/L (ref 0–53)
AST: 24 U/L (ref 0–37)
Albumin: 3.5 g/dL (ref 3.5–5.2)
Alkaline Phosphatase: 71 U/L (ref 39–117)
Anion gap: 13 (ref 5–15)
BUN: 16 mg/dL (ref 6–23)
CO2: 27 mEq/L (ref 19–32)
CREATININE: 1 mg/dL (ref 0.50–1.35)
Calcium: 9.3 mg/dL (ref 8.4–10.5)
Chloride: 101 mEq/L (ref 96–112)
GFR, EST NON AFRICAN AMERICAN: 84 mL/min — AB (ref >90)
GLUCOSE: 104 mg/dL — AB (ref 70–99)
Potassium: 4 mEq/L (ref 3.7–5.3)
Sodium: 141 mEq/L (ref 137–147)
Total Bilirubin: 0.5 mg/dL (ref 0.3–1.2)
Total Protein: 6.3 g/dL (ref 6.0–8.3)

## 2014-06-02 LAB — CBC
HEMATOCRIT: 34.3 % — AB (ref 39.0–52.0)
Hemoglobin: 11.5 g/dL — ABNORMAL LOW (ref 13.0–17.0)
MCH: 32.9 pg (ref 26.0–34.0)
MCHC: 33.5 g/dL (ref 30.0–36.0)
MCV: 98 fL (ref 78.0–100.0)
Platelets: 189 10*3/uL (ref 150–400)
RBC: 3.5 MIL/uL — AB (ref 4.22–5.81)
RDW: 12.7 % (ref 11.5–15.5)
WBC: 7.2 10*3/uL (ref 4.0–10.5)

## 2014-06-02 MED ORDER — OXYCODONE HCL 5 MG PO TABS: 5.0000 mg | ORAL_TABLET | ORAL | Status: DC | PRN

## 2014-06-02 NOTE — Progress Notes (Signed)
Chaplain responded to consult.  Chaplain spent time providing pastoral counseling and grief support.  Pt is undergoing a major life transition and he shared with the chaplain his feelings toward that change.  Chaplain provided spiritual support and prayer for the pt.  Pt is discharging shortly and needed to end the visit.  A follow up is not needed.

## 2014-06-02 NOTE — Progress Notes (Signed)
Patient D/C'd to home. Telemetry and IV D/C'd.  D/C instructions reviewed with patient and prescription given to patient. Patient states no questions about D/C instructions.

## 2014-06-02 NOTE — Telephone Encounter (Signed)
Patient called and spoke with Roxanne Gates, per RN to sch 06/03/14 apt at 4:30

## 2014-06-02 NOTE — Discharge Summary (Signed)
Physician Discharge Summary  Patient ID: Brian [name] MRN: [id number] DOB/AGE: [birth year] 53 y.o.  Admit date: 05/31/2014 Discharge date: 06/02/2014  Admission Diagnoses:  Patient Active Problem List   Diagnosis Date Noted  . Pleural mass 05/31/2014  . Abdominal pain, epigastric 02/02/2014  . Weight loss 08/12/2012  . H/O Bell's palsy 08/12/2012  . Hodgkin's disease 08/09/2011   Discharge Diagnoses:  Classical Hodgkin's lymphoma of left pleura Patient Active Problem List   Diagnosis Date Noted  . Pleural mass 05/31/2014  . Abdominal pain, epigastric 02/02/2014  . Weight loss 08/12/2012  . H/O Bell's palsy 08/12/2012  . Hodgkin's disease 08/09/2011   Discharged Condition: good  History of Present Illness:   Brian [name] is a 53 yo white male with known history of Relapsing nodular sclerosing Hodgkin's disease.  This was originally diagnosed in 2013.  He initially responded well to treatment, but later developed a rapid recurrence.  He therefore underwent autologous stem cell transplant at [facility] in May of last year.  He was undergoing routine PET/CT follow up which unfortunately revealed hypermetabolic thickening of the pleura in the left chest.  He underwent CT guided biopsy at [facility] which was non-diagnostic.  It was felt he would require VATS for possible diagnosis.  He was referred to [facility] and evaluated by Dr. Roxan Hockey on 05/11/2014 at which time he felt VATS would be an appropriate diagnosis option.  The risks and benefits of the procedure were explained to the patient and he was agreeable to proceed.  Hospital Course:   Brian [name] presented to [facility] on 05/31/2014.  He was taken to the operating room and underwent Left Video Assisted Thoracoscopy with resection of left pleural mass.  He tolerated the procedure well, was extubated and taken to the PACU in stable condition.  The patient has done well since surgery.  His chest tube did not exhibit an air leak and there was minimal  output present.  His CXR was also stable and did not show evidence of pneumothorax.  Therefore, it was removed without difficulty.  The patient's follow up CXR was again free from pneumothorax.  He has been weaned off his PCA and his pain is well controlled with oral medication.  He is ambulating and tolerating a regular diet.  He is felt to be medically stable for discharge home today 06/02/2014.  Initial frozen pathology in the operating room suggests a recurrence of Non-Hodgkin's Lymphoma.  Final pathology remains pending.  The patient will follow up with Dr. Marin Olp on 06/10/2014.  Treatments: surgery:   Left video-assisted thoracoscopy and resection of left pleural mass.  Disposition: 01-Home or Self Care  Discharge Medications:      Medication List         acetaminophen 325 MG tablet  Commonly known as:  TYLENOL  Take 325 mg by mouth every 6 (six) hours as needed for mild pain.     ALPRAZolam 0.5 MG tablet  Commonly known as:  XANAX  Take 0.5 mg by mouth daily as needed for anxiety or sleep.     aspirin 325 MG tablet  Take 325 mg by mouth daily as needed for mild pain.     ibuprofen 200 MG tablet  Commonly known as:  ADVIL,MOTRIN  Take 200-400 mg by mouth every 6 (six) hours as needed for mild pain.     LORazepam 1 MG tablet  Commonly known as:  ATIVAN  Take 0.5-1 mg by mouth 2 (two) times daily as needed for  anxiety or sleep.     multivitamin tablet  Take 1 tablet by mouth daily.     oxyCODONE 5 MG immediate release tablet  Commonly known as:  Oxy IR/ROXICODONE  Take 1-2 tablets (5-10 mg total) by mouth every 4 (four) hours as needed for severe pain.     Vitamin D 1000 UNITS capsule  Take 1,000 Units by mouth daily.       Follow-up Information   Follow up with Melrose Nakayama, MD On 06/15/2014. (Appointment is at 10:00)    Specialty:  Cardiothoracic Surgery   Contact information:   [place] [address] [phone]        Follow up with [facility] On 06/15/2014. (Please get CXR 1 hour prior to your appointment with Dr. Roxan Hockey)    Specialty:  Diagnostic Radiology   Contact information:   [phone] [address] [phone]       Signed: Ellwood Handler 06/02/2014, 9:20 AM

## 2014-06-02 NOTE — Discharge Instructions (Signed)
Video-Assisted Thoracic Surgery °Care After °Refer to this sheet in the next few weeks. These instructions provide you with information on caring for yourself after your procedure. Your caregiver may also give you more specific instructions. Your procedure has been planned according to current medical practices, but problems sometimes occur. Call your caregiver if you have any problems or questions after your procedure. °HOME CARE INSTRUCTIONS  °· Only take over-the-counter or prescription medications as directed. °· Only take pain medications (narcotics) as directed. °· Do not drive until your caregiver approves. Driving while taking narcotics or soon after surgery can be dangerous, so discuss the specific timing with your caregiver. °· Avoid activities that use your chest muscles, such as lifting heavy objects, for at least 3-4 weeks.   °· Take deep breaths to expand the lungs and to protect against pneumonia. °· Do breathing exercises as directed by your caregiver. If you were given an incentive spirometer to help with breathing, use it as directed. °· You may resume a normal diet and activities when you feel you are able to or as directed. °· Do not take a bath until your caregiver says it is OK. Use the shower instead.   °· Keep the bandage (dressing) covering the area where the chest tube was inserted (incision site) dry for 48 hours. After 48 hours, remove the dressing unless there is new drainage. °· Remove dressings as directed by your caregiver. °· Change dressings if necessary or as directed. °· Keep all follow-up appointments. It is important for you to see your caregiver after surgery to discuss appropriate follow-up care and surveillance, if it is necessary. °SEEK MEDICAL CARE: °· You feel excessive or increasing pain at an incision site. °· You notice bleeding, skin irritation, drainage, swelling, or redness at an incision site. °· There is a bad smell coming from an incision or dressing. °· It feels  like your heart is fluttering or beating rapidly. °· Your pain medication does not relieve your pain. °SEEK IMMEDIATE MEDICAL CARE IF:  °· You have a fever.   °· You have chest pain.  °· You have a rash. °· You have shortness of breath. °· You have trouble breathing.   °· You feel weak, lightheaded, dizzy, or faint.   °MAKE SURE YOU:  °· Understand these instructions.   °· Will watch your condition.   °· Will get help right away if you are not doing well or get worse. °Document Released: 03/09/2013 Document Reviewed: 03/09/2013 °ExitCare® Patient Information ©2015 [employer]. This information is not intended to replace advice given to you by your health care provider. Make sure you discuss any questions you have with your health care provider. ° °

## 2014-06-02 NOTE — Progress Notes (Addendum)
      [address]       [place] [address]             [phone]      2 Days Post-Op Procedure(s) (LRB): LEFT VIDEO ASSISTED THORACOSCOPY, PLEURAL BIOPSY (Left)  Subjective:  Brian [name] has no complaints this morning.  He states his pain is down to a 1 after chest tube removal.  He is hopeful to be discharged today  Objective: Vital signs in last 24 hours: Temp:  [98.7 F (37.1 C)-99 F (37.2 C)] 98.7 F (37.1 C) (07/08 0622) Pulse Rate:  [80-95] 80 (07/08 0622) Cardiac Rhythm:  [-] Normal sinus rhythm (07/07 2151) Resp:  [16-27] 20 (07/08 0719) BP: (119-135)/(68-77) 126/75 mmHg (07/08 0622) SpO2:  [36 %-99 %] 98 % (07/08 0719)  Intake/Output from previous day: 07/07 0701 - 07/08 0700 In: 857.5 [P.O.:660; I.V.:197.5] Out: 2945 [Urine:2865; Chest Tube:80]  General appearance: alert, cooperative and no distress Heart: regular rate and rhythm Lungs: clear to auscultation bilaterally Abdomen: soft, non-tender; bowel sounds normal; no masses,  no organomegaly Wound: clean and dry  Lab Results:  Recent Labs  06/01/14 0415 06/02/14 0335  WBC 6.7 7.2  HGB 11.3* 11.5*  HCT 33.5* 34.3*  PLT 168 189   BMET:  Recent Labs  06/01/14 0415 06/02/14 0335  NA 137 141  K 3.9 4.0  CL 99 101  CO2 25 27  GLUCOSE 112* 104*  BUN 18 16  CREATININE 0.88 1.00  CALCIUM 8.9 9.3    PT/INR: No results found for this basename: LABPROT, INR,  in the last 72 hours ABG    Component Value Date/Time   PHART 7.396 06/01/2014 0410   HCO3 26.9* 06/01/2014 0410   TCO2 28 06/01/2014 0410   ACIDBASEDEF 0.1 05/27/2014 1019   O2SAT 96.0 06/01/2014 0410   CBG (last 3)  No results found for this basename: GLUCAP,  in the last 72 hours  Assessment/Plan: S/P Procedure(s) (LRB): LEFT VIDEO ASSISTED THORACOSCOPY, PLEURAL BIOPSY (Left)  1. Chest tube removed yesterday, CXR with + atelectasis, no pneumothorax 2. D/C PCA, continue Oxy 3. Preliminary path- Hodgkins Lymphoma-   Oncologist aware 4. Dispo- patient doing well, hoping to be discharged home today, will plan for d/c if okay with Dr. Roxan Hockey   LOS: 2 days    Brian [name] 06/02/2014  Talked with pathology- Final path is Classical Hodgkin's lymphoma Will dc today

## 2014-06-02 NOTE — Progress Notes (Signed)
PCA pump Fentanyl 36cc wasted with Liana Gerold, RN in sink.

## 2014-06-02 NOTE — Progress Notes (Signed)
I stopped by on 7/7 to say "Hi!" and to let him know that I am praying for him!!  I greatly appreciate the outstanding care that he is receiving in the ICU and the expert surgery by Dr. Roxan Hockey.  The preliminary path is Hodgkin's disease. Final path hopefully out today.  I already spoke with the transplant docs at [facility].  We both agree that allogeneic transplant is the way to go.  He has 3 brothers.  [facility] already has [name] HLA typing and will get the brothers.  We will use the new "targeted" drug for Hodgkin's - Adcetris - which should have >75% response.  He will need another bone marrow bx, PFTs. MUGA and PET scan.  We can do this as an outpatient.  I will be more that happy to help in ANY way!!  Prayer WILL be the best medicine!!  Brian [name] 1:12

## 2014-06-03 ENCOUNTER — Ambulatory Visit (HOSPITAL_BASED_OUTPATIENT_CLINIC_OR_DEPARTMENT_OTHER): Payer: Commercial Managed Care - PPO | Admitting: Hematology & Oncology

## 2014-06-03 DIAGNOSIS — C8195 Hodgkin lymphoma, unspecified, lymph nodes of inguinal region and lower limb: Secondary | ICD-10-CM

## 2014-06-03 NOTE — Progress Notes (Signed)
Brian [name] and his wife came to the office today. He just got out of the Hospital yesterday. He had thoracoscopic surgery. This was on the left side. He was found to have a pleural based  mass.  He has a history of recurrent Hodgkin's lymphoma. He underwent autologous stem cell transplant at [facility]. Think this was back in May or June of 2014. He had been doing well. He got to the transplant. He then had a routine scan which showed some questionable activity. As such, he underwent further evaluation.  He did have high-dose chemotherapy and radiation therapy as condition regimens for the transplant  He underwent the thoracoscopic procedure on July 6. A pleural based mass was noted. This is partially resected. And the pathology report ([id number]) showed Hodgkin's disease. I did speak with pathologist to see if they could run a CD56 stain.  I spent a good hour or so with he and his wife. I have really gone to do them well. We have had a really good time with them. I really absolutely hate that this is recurring again.  I think the fact that this is really recurred about a year or so from transplant and that is his extra nodal disease makes this more aggressive.  He has 3 brothers. I believe they will be HLA typed and see if he does have a match.  I believe that an allogeniec transplant would be the best way to try to treat this now. Again, he is 53 years old. 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 This will have to be kept in mind.  I believe that we should start treatment with Brentuximab. This is the new CD30 drug for relapsed Hodgkin disease. I think this would be a very good choice for him. I think this would be well tolerated.   Before we start therapy, he will need staging studies. He will need a bone marrow biopsy and aspirate. He will need an echocardiogram he will need an MRI of the brain. He will be a pulmonary function test. He also will  need a Port-A-Cath.  [name] and his wife both are very well aware of the very serious situation that we have. It is going to be very to cure this no matter what therapy is used. One, again, has to think about the side effects from a transplant.  We will try to get everything started soon.  I will plan to get him back once we have all of our studies in place.  I do not find a need to do any exam on him since I just saw him 2 days ago. He had lab work done yesterday.

## 2014-06-04 ENCOUNTER — Encounter (HOSPITAL_COMMUNITY): Payer: Self-pay | Admitting: Thoracic Surgery (Cardiothoracic Vascular Surgery)

## 2014-06-04 NOTE — Addendum Note (Signed)
Addended by: Burney Gauze R on: 06/04/2014 01:41 PM   Modules accepted: Orders

## 2014-06-07 ENCOUNTER — Encounter (HOSPITAL_COMMUNITY): Payer: Self-pay | Admitting: Pharmacy Technician

## 2014-06-08 ENCOUNTER — Other Ambulatory Visit: Payer: Self-pay

## 2014-06-08 DIAGNOSIS — R222 Localized swelling, mass and lump, trunk: Secondary | ICD-10-CM

## 2014-06-09 ENCOUNTER — Other Ambulatory Visit: Payer: Self-pay | Admitting: Thoracic Surgery (Cardiothoracic Vascular Surgery)

## 2014-06-09 ENCOUNTER — Other Ambulatory Visit: Payer: Self-pay | Admitting: Radiology

## 2014-06-10 ENCOUNTER — Other Ambulatory Visit: Payer: Commercial Managed Care - PPO | Admitting: Lab

## 2014-06-10 ENCOUNTER — Other Ambulatory Visit: Payer: Self-pay | Admitting: Thoracic Surgery (Cardiothoracic Vascular Surgery)

## 2014-06-10 ENCOUNTER — Other Ambulatory Visit: Payer: Self-pay | Admitting: *Deleted

## 2014-06-10 ENCOUNTER — Ambulatory Visit: Payer: Commercial Managed Care - PPO | Admitting: Hematology & Oncology

## 2014-06-10 ENCOUNTER — Telehealth: Payer: Self-pay | Admitting: Hematology & Oncology

## 2014-06-10 DIAGNOSIS — R222 Localized swelling, mass and lump, trunk: Secondary | ICD-10-CM

## 2014-06-10 NOTE — Telephone Encounter (Signed)
Pt aware of all appointmets 7-17 to 7-23

## 2014-06-11 ENCOUNTER — Ambulatory Visit (HOSPITAL_COMMUNITY)
Admission: RE | Admit: 2014-06-11 | Discharge: 2014-06-11 | Disposition: A | Discharge status: Home / Self Care | Payer: Commercial Managed Care - PPO | Source: Ambulatory Visit | Attending: Hematology & Oncology | Admitting: Hematology & Oncology

## 2014-06-11 ENCOUNTER — Other Ambulatory Visit: Payer: Self-pay | Admitting: Hematology & Oncology

## 2014-06-11 ENCOUNTER — Encounter (HOSPITAL_COMMUNITY): Payer: Self-pay

## 2014-06-11 ENCOUNTER — Other Ambulatory Visit: Payer: Self-pay | Admitting: *Deleted

## 2014-06-11 DIAGNOSIS — I4949 Other premature depolarization: Secondary | ICD-10-CM | POA: Insufficient documentation

## 2014-06-11 DIAGNOSIS — C8195 Hodgkin lymphoma, unspecified, lymph nodes of inguinal region and lower limb: Secondary | ICD-10-CM

## 2014-06-11 DIAGNOSIS — C8190 Hodgkin lymphoma, unspecified, unspecified site: Secondary | ICD-10-CM | POA: Insufficient documentation

## 2014-06-11 LAB — PROTIME-INR
INR: 1.03 (ref 0.00–1.49)
Prothrombin Time: 13.5 seconds (ref 11.6–15.2)

## 2014-06-11 LAB — CBC WITH DIFFERENTIAL/PLATELET
Basophils Absolute: 0.1 10*3/uL (ref 0.0–0.1)
Basophils Relative: 1 % (ref 0–1)
EOS PCT: 4 % (ref 0–5)
Eosinophils Absolute: 0.3 10*3/uL (ref 0.0–0.7)
HCT: 38.8 % — ABNORMAL LOW (ref 39.0–52.0)
HEMOGLOBIN: 13.3 g/dL (ref 13.0–17.0)
Lymphocytes Relative: 10 % — ABNORMAL LOW (ref 12–46)
Lymphs Abs: 0.7 10*3/uL (ref 0.7–4.0)
MCH: 32.8 pg (ref 26.0–34.0)
MCHC: 34.3 g/dL (ref 30.0–36.0)
MCV: 95.8 fL (ref 78.0–100.0)
MONOS PCT: 9 % (ref 3–12)
Monocytes Absolute: 0.6 10*3/uL (ref 0.1–1.0)
Neutro Abs: 5.2 10*3/uL (ref 1.7–7.7)
Neutrophils Relative %: 76 % (ref 43–77)
Platelets: 306 10*3/uL (ref 150–400)
RBC: 4.05 MIL/uL — AB (ref 4.22–5.81)
RDW: 12.1 % (ref 11.5–15.5)
WBC: 6.8 10*3/uL (ref 4.0–10.5)

## 2014-06-11 MED ORDER — HEPARIN SOD (PORK) LOCK FLUSH 100 UNIT/ML IV SOLN
500.0000 [IU] | Freq: Once | INTRAVENOUS | Status: AC
  Administered 2014-06-11: 500 [IU] via INTRAVENOUS

## 2014-06-11 MED ORDER — LIDOCAINE-EPINEPHRINE (PF) 2 %-1:200000 IJ SOLN
INTRAMUSCULAR | Status: AC
  Filled 2014-06-11: qty 20

## 2014-06-11 MED ORDER — SODIUM CHLORIDE 0.9 % IV SOLN
INTRAVENOUS | Status: DC
  Administered 2014-06-11: 12:00:00 via INTRAVENOUS

## 2014-06-11 MED ORDER — MIDAZOLAM HCL 2 MG/2ML IJ SOLN
INTRAMUSCULAR | Status: AC
  Filled 2014-06-11: qty 4

## 2014-06-11 MED ORDER — HEPARIN SOD (PORK) LOCK FLUSH 100 UNIT/ML IV SOLN
INTRAVENOUS | Status: AC
  Filled 2014-06-11: qty 5

## 2014-06-11 MED ORDER — ACETAMINOPHEN 325 MG PO TABS
650.0000 mg | ORAL_TABLET | Freq: Once | ORAL | Status: AC
  Administered 2014-06-11: 650 mg via ORAL
  Filled 2014-06-11: qty 2

## 2014-06-11 MED ORDER — CEFAZOLIN SODIUM-DEXTROSE 2-3 GM-% IV SOLR
2.0000 g | Freq: Once | INTRAVENOUS | Status: AC
  Administered 2014-06-11: 2 g via INTRAVENOUS

## 2014-06-11 MED ORDER — MIDAZOLAM HCL 2 MG/2ML IJ SOLN
INTRAMUSCULAR | Status: AC | PRN
  Administered 2014-06-11: 2 mg via INTRAVENOUS
  Administered 2014-06-11: 1 mg via INTRAVENOUS

## 2014-06-11 MED ORDER — FENTANYL CITRATE 0.05 MG/ML IJ SOLN
INTRAMUSCULAR | Status: DC
Start: 2014-06-11 — End: 2014-06-12
  Filled 2014-06-11: qty 4

## 2014-06-11 MED ORDER — LIDOCAINE-PRILOCAINE 2.5-2.5 % EX CREA: 1.0000 "application " | TOPICAL_CREAM | CUTANEOUS | Status: DC | PRN

## 2014-06-11 MED ORDER — CEFAZOLIN SODIUM-DEXTROSE 2-3 GM-% IV SOLR
INTRAVENOUS | Status: AC
  Filled 2014-06-11: qty 50

## 2014-06-11 MED ORDER — FENTANYL CITRATE 0.05 MG/ML IJ SOLN
INTRAMUSCULAR | Status: AC | PRN
Start: 2014-06-11 — End: 2014-06-11
  Administered 2014-06-11: 100 ug via INTRAVENOUS
  Administered 2014-06-11: 50 ug via INTRAVENOUS

## 2014-06-11 NOTE — Procedures (Signed)
Successful placement of right IJ approach port-a-cath with tip at the superior caval atrial junction. The catheter is ready for immediate use. No immediate post procedural complications. 

## 2014-06-11 NOTE — Discharge Instructions (Signed)

## 2014-06-11 NOTE — H&P (Signed)
Chief Complaint: "I am here for a port." Referring Physician: Dr. Marin Olp HPI: Brian [name] is an 53 y.o. male with recurrent hodgkin's lymphoma here today for a port a catheter. He underwent a thoracoscopic procedure on May 31, 2014 a pleural based mass was noted. This was partially resected with the pathology report revealing Hodgkin's disease. He has previously had a right IJ hickman catheter with the right chest site scar healing well. He denies any chest pain, shortness of breath or palpitations. He denies any active signs of bleeding or excessive bruising. He denies any recent fever or chills. The patient denies any history of sleep apnea or chronic oxygen use. He has previously tolerated sedation without complications.    Past Medical History:  Past Medical History  Diagnosis Date  . Wears contact lenses   . H/O autologous stem cell transplant     may 2014  at [facility]  . History of peptic ulcer   . Mass of right inguinal region   . History of Bell's palsy     2009  RIGHT SIDE--  HAS 80% FUNCTION / PT STATES A LITTLE ASYMETRICAL AND EFFECTS MOUTH  . Hodgkin's disease, nodular sclerosis, of inguinal region/lower limb ONOLOGIST--  [facility] AND [facility]      SALVAGE CHEMO 2013/  AUTOLOGUS STEM CELL TRANSPLANT MAY 2014 AT [facility]  . Dysrhythmia     past hx pvc  . Anxiety   . PVC (premature ventricular contraction)     "benign"    Past Surgical History:  Past Surgical History  Procedure Laterality Date  . Axillary lymph node biopsy Left 02/02/2013    Procedure: NEEDLE LOCALIZED AXILLARY LYMPH NODE BIOPSY;  Surgeon: Haywood Lasso, MD;  Location: [facility];  Service: General;  Laterality: Left;  . Cyst removal neck Left 1980  . Removal right inguinal lymph nodes  08-16-2011  . Left inguinal lymph node bx  09-09-2012  . Transthoracic echocardiogram  03-18-2013    MILD LVH/  EF 55-60%  . Scrotal exploration Right 01/04/2014    Procedure: SCROTUM EXPLORATION   INGUINAL ,  EXCISION OF CYSTIC MASS OF RIGHT SPERMATIC CORD, WITH FROZEN SECTION;  Surgeon: Franchot Gallo, MD;  Location: [facility];  Service: Urology;  Laterality: Right;  . Pleura biopsy Left 05/31/2014  . Video assisted thoracoscopy (vats)/wedge resection  05/31/2014  . Bone marrow biopsy  08/2012  . Video assisted thoracoscopy Left 05/31/2014    Procedure: LEFT VIDEO ASSISTED THORACOSCOPY, PLEURAL BIOPSY;  Surgeon: Melrose Nakayama, MD;  Location: [facility] OR;  Service: Thoracic;  Laterality: Left;    Family History:  Family History  Problem Relation Age of Onset  . Cancer Mother 54    ovarian  . Heart disease Father   . Stroke Father   . Cancer Sister     ovarian  . Cancer Brother     testicular    Social History:  reports that he has never smoked. He has never used smokeless tobacco. He reports that he drinks about 2.4 ounces of alcohol per week. He reports that he does not use illicit drugs.  Allergies: No Known Allergies  Medications:   Medication List    ASK your doctor about these medications       acetaminophen 325 MG tablet  Commonly known as:  TYLENOL  Take 325 mg by mouth every 6 (six) hours as needed for mild pain.     ALPRAZolam 0.5 MG tablet  Commonly known as:  XANAX  Take 0.5 mg by mouth daily as needed for anxiety or sleep.     ibuprofen 200 MG tablet  Commonly known as:  ADVIL,MOTRIN  Take 200-400 mg by mouth every 6 (six) hours as needed for mild pain.     LORazepam 1 MG tablet  Commonly known as:  ATIVAN  Take 0.5-1 mg by mouth 2 (two) times daily as needed for anxiety or sleep.     multivitamin tablet  Take 1 tablet by mouth daily.     oxyCODONE 5 MG immediate release tablet  Commonly known as:  Oxy IR/ROXICODONE  Take 1-2 tablets (5-10 mg total) by mouth every 4 (four) hours as needed for severe pain.     Vitamin D 1000 UNITS capsule  Take 1,000 Units by mouth 2 (two) times daily.       Please HPI for pertinent positives, otherwise  complete 10 system ROS negative.  Physical Exam: BP 131/86  Pulse 88  Temp(Src) 97.7 F (36.5 C) (Oral)  Resp 20  Ht 5' 10"  (1.778 m)  Wt 170 lb (77.111 kg)  BMI 24.39 kg/m2  SpO2 99% Body mass index is 24.39 kg/(m^2).  General Appearance:  Alert, cooperative, no distress  Head:  Normocephalic, without obvious abnormality, atraumatic  Neck: Supple, symmetrical, trachea midline  Lungs:   Clear to auscultation bilaterally, no w/r/r, respirations unlabored without use of accessory muscles.  Chest Wall:  No tenderness or deformity  Heart:  Regular rate and rhythm, S1, S2 normal, no murmur, rub or gallop.  Abdomen:   Soft, non-tender, non distended, (+) BS  Extremities: Extremities normal, atraumatic, no cyanosis or edema  Neurologic: Normal affect, no gross deficits.   Results for orders placed during the hospital encounter of 06/11/14 (from the past 48 hour(s))  CBC WITH DIFFERENTIAL     Status: Abnormal   Collection Time    06/11/14 12:10 PM      Result Value Ref Range   WBC 6.8  4.0 - 10.5 K/uL   RBC 4.05 (*) 4.22 - 5.81 MIL/uL   Hemoglobin 13.3  13.0 - 17.0 g/dL   HCT 38.8 (*) 39.0 - 52.0 %   MCV 95.8  78.0 - 100.0 fL   MCH 32.8  26.0 - 34.0 pg   MCHC 34.3  30.0 - 36.0 g/dL   RDW 12.1  11.5 - 15.5 %   Platelets 306  150 - 400 K/uL   Neutrophils Relative % 76  43 - 77 %   Neutro Abs 5.2  1.7 - 7.7 K/uL   Lymphocytes Relative 10 (*) 12 - 46 %   Lymphs Abs 0.7  0.7 - 4.0 K/uL   Monocytes Relative 9  3 - 12 %   Monocytes Absolute 0.6  0.1 - 1.0 K/uL   Eosinophils Relative 4  0 - 5 %   Eosinophils Absolute 0.3  0.0 - 0.7 K/uL   Basophils Relative 1  0 - 1 %   Basophils Absolute 0.1  0.0 - 0.1 K/uL  PROTIME-INR     Status: None   Collection Time    06/11/14 12:10 PM      Result Value Ref Range   Prothrombin Time 13.5  11.6 - 15.2 seconds   INR 1.03  0.00 - 1.49   No results found.  Assessment/Plan Recurrent hodgkin's lymphoma  Request for a port a catheter with  moderate sedation Patient has been NPO, no blood thinners taken, afebrile Risks and Benefits discussed with the patient. All  of the patient's questions were answered, patient is agreeable to proceed. Consent signed and in chart.   Tsosie Billing D PA-C 06/11/2014, 12:49 PM

## 2014-06-12 ENCOUNTER — Ambulatory Visit (HOSPITAL_BASED_OUTPATIENT_CLINIC_OR_DEPARTMENT_OTHER)
Admission: RE | Admit: 2014-06-12 | Discharge: 2014-06-12 | Disposition: A | Discharge status: Home / Self Care | Payer: Commercial Managed Care - PPO | Source: Ambulatory Visit | Attending: Hematology & Oncology | Admitting: Hematology & Oncology

## 2014-06-12 ENCOUNTER — Ambulatory Visit (HOSPITAL_BASED_OUTPATIENT_CLINIC_OR_DEPARTMENT_OTHER)
Admission: RE | Admit: 2014-06-12 | Discharge: 2014-06-12 | Disposition: A | Discharge status: Home / Self Care | Payer: Commercial Managed Care - PPO | Source: Ambulatory Visit | Attending: Thoracic Surgery (Cardiothoracic Vascular Surgery) | Admitting: Thoracic Surgery (Cardiothoracic Vascular Surgery)

## 2014-06-12 DIAGNOSIS — C8195 Hodgkin lymphoma, unspecified, lymph nodes of inguinal region and lower limb: Secondary | ICD-10-CM | POA: Diagnosis not present

## 2014-06-12 DIAGNOSIS — R222 Localized swelling, mass and lump, trunk: Secondary | ICD-10-CM

## 2014-06-12 MED ORDER — GADOBENATE DIMEGLUMINE 529 MG/ML IV SOLN: 15.0000 mL | Freq: Once | INTRAVENOUS | Status: DC | PRN

## 2014-06-14 ENCOUNTER — Other Ambulatory Visit: Payer: Commercial Managed Care - PPO

## 2014-06-14 ENCOUNTER — Encounter: Payer: Self-pay | Admitting: *Deleted

## 2014-06-14 ENCOUNTER — Ambulatory Visit (INDEPENDENT_AMBULATORY_CARE_PROVIDER_SITE_OTHER): Payer: Self-pay | Admitting: Physician Assistant

## 2014-06-14 ENCOUNTER — Other Ambulatory Visit: Payer: Self-pay | Admitting: *Deleted

## 2014-06-14 VITALS — BP 116/79 | HR 100 | Resp 20 | Ht 70.0 in | Wt 170.0 lb

## 2014-06-14 DIAGNOSIS — Z09 Encounter for follow-up examination after completed treatment for conditions other than malignant neoplasm: Secondary | ICD-10-CM

## 2014-06-14 DIAGNOSIS — J948 Other specified pleural conditions: Secondary | ICD-10-CM

## 2014-06-14 DIAGNOSIS — R222 Localized swelling, mass and lump, trunk: Secondary | ICD-10-CM

## 2014-06-14 DIAGNOSIS — C8195 Hodgkin lymphoma, unspecified, lymph nodes of inguinal region and lower limb: Secondary | ICD-10-CM

## 2014-06-14 DIAGNOSIS — C8190 Hodgkin lymphoma, unspecified, unspecified site: Secondary | ICD-10-CM

## 2014-06-14 MED ORDER — HYDROCODONE-ACETAMINOPHEN 5-325 MG PO TABS: 1.0000 | ORAL_TABLET | ORAL | Status: DC | PRN

## 2014-06-14 MED ORDER — CITALOPRAM HYDROBROMIDE 20 MG PO TABS: 20.0000 mg | ORAL_TABLET | Freq: Every day | ORAL | Status: DC

## 2014-06-14 MED ORDER — OXYCODONE HCL 5 MG PO TABS: 5.0000 mg | ORAL_TABLET | ORAL | Status: DC | PRN

## 2014-06-14 NOTE — Progress Notes (Signed)
HPI: Patient returns for routine postoperative follow-up having undergone a left VATS and resection of left pleural mass on 05/31/2014. Pathology showed Hodgkin lymphoma. He most recently has had a porta-a-cath placed on 07/17 by Dr. Pascal Lux. Since hospital discharge, the patient reports he has some pain under left chest wound as well as some numbness and minor swelling under left ribs anteriorly and on upper abdomen. He denies any fever, chills, or shortness of breath.   Current Outpatient Prescriptions  Medication Sig Dispense Refill  . acetaminophen (TYLENOL) 325 MG tablet Take 325 mg by mouth every 6 (six) hours as needed for mild pain.      Marland Kitchen ALPRAZolam (XANAX) 0.5 MG tablet Take 0.5 mg by mouth daily as needed for anxiety or sleep.      . Cholecalciferol (VITAMIN D) 1000 UNITS capsule Take 1,000 Units by mouth 2 (two) times daily.       Marland Kitchen ibuprofen (ADVIL,MOTRIN) 200 MG tablet Take 200-400 mg by mouth every 6 (six) hours as needed for mild pain.       Marland Kitchen lidocaine-prilocaine (EMLA) cream Apply 1 application topically as needed. Apply nickel sized amount to portacath site at least 1 hour prior to chemotherapy.  30 g  1  . LORazepam (ATIVAN) 1 MG tablet Take 0.5-1 mg by mouth 2 (two) times daily as needed for anxiety or sleep.      . Multiple Vitamin (MULTIVITAMIN) tablet Take 1 tablet by mouth daily.      Marland Kitchen oxyCODONE (OXY IR/ROXICODONE) 5 MG immediate release tablet Take 1-2 tablets (5-10 mg total) by mouth every 4 (four) hours as needed for severe pain.  30 tablet  0   No current facility-administered medications for this visit.    Physical Exam: Cardiovascular RRR Pulmonary Clear to auscultation Extremities No cyanosis, clubbing,or edema Wounds Clean and dry. One chest tube suture removed.  Diagnostic Tests: EXAM:  CHEST 2 VIEW  COMPARISON: Preoperative chest x-ray 06/02/2014; intraoperative  radiographs 06/11/2014 and  FINDINGS:  Right IJ approach single-lumen port catheter. The  catheter tip  overlies the superior cavoatrial junction. Surgical clips noted in  the region of the left hilum corresponding with recent thoracoscopic  resection of a left pleural mass. Additionally, there is a surgical  clip in the helical linear opacity in the left cardiophrenic angle  which was not present on prior imaging. This is of uncertain  etiology and also likely related to the prior surgery. The lungs are  clear. No evidence of pneumothorax or hemothorax. Cardiac and  mediastinal contours are unchanged.  IMPRESSION:  1. No active cardiopulmonary disease.  2. Well-positioned right IJ approach single-lumen power port a  catheter.  3. Surgical clips posteriorly within the left mid thorax bracketing  the site of the resected pleural-based mass.  4. New surgical clip and helical linear structure in the left  cardiophrenic angle of uncertain etiology. This is also likely  related to the recent surgery and may represent a displaced clip and  possibly linear suture material which fell into the cardiophrenic  sulcus. A small retained sponge is considered less likely given the  very small size of the structure.  These results will be called to the ordering clinician or  representative by the Radiologist Assistant, and communication  documented in the PACS or zVision Dashboard.    Impression: Overall, he is surgically stable from left VATS and resection of pleural mass.   Plan: Of note, he had an MRI of the brain last week and is  showed no evidence of cranial or intracranial involvement. He is scheduled to undergo a bone marrow biopsy tomorrow. He is then scheduled for chemotherapy Adcetris to begin on Thursday. I gave him reassurance that the numbness and pain should improve with time. He asked for a prescription for Norco, which I gave him (#30 with no refills). He will return to see Dr. Roxan Hockey PRN.

## 2014-06-14 NOTE — Progress Notes (Signed)
Spent time with patient and his wife in chemotherapy class discussing Adcetris.  Answered patients questions and reviewed side effects of Adcetris.  Teachback done.

## 2014-06-15 ENCOUNTER — Encounter (HOSPITAL_COMMUNITY): Payer: Self-pay

## 2014-06-15 ENCOUNTER — Ambulatory Visit (HOSPITAL_COMMUNITY)
Admission: RE | Admit: 2014-06-15 | Discharge: 2014-06-15 | Disposition: A | Discharge status: Home / Self Care | Payer: Commercial Managed Care - PPO | Source: Ambulatory Visit | Attending: Hematology & Oncology | Admitting: Hematology & Oncology

## 2014-06-15 ENCOUNTER — Ambulatory Visit: Payer: Commercial Managed Care - PPO | Admitting: Thoracic Surgery (Cardiothoracic Vascular Surgery)

## 2014-06-15 VITALS — BP 113/85 | HR 77 | Temp 98.1°F | Resp 18

## 2014-06-15 DIAGNOSIS — D649 Anemia, unspecified: Secondary | ICD-10-CM | POA: Diagnosis not present

## 2014-06-15 DIAGNOSIS — C8190 Hodgkin lymphoma, unspecified, unspecified site: Secondary | ICD-10-CM | POA: Diagnosis present

## 2014-06-15 DIAGNOSIS — D704 Cyclic neutropenia: Secondary | ICD-10-CM | POA: Insufficient documentation

## 2014-06-15 DIAGNOSIS — C8195 Hodgkin lymphoma, unspecified, lymph nodes of inguinal region and lower limb: Secondary | ICD-10-CM

## 2014-06-15 LAB — CBC
HEMATOCRIT: 37.3 % — AB (ref 39.0–52.0)
Hemoglobin: 12.8 g/dL — ABNORMAL LOW (ref 13.0–17.0)
MCH: 33 pg (ref 26.0–34.0)
MCHC: 34.3 g/dL (ref 30.0–36.0)
MCV: 96.1 fL (ref 78.0–100.0)
PLATELETS: 307 10*3/uL (ref 150–400)
RBC: 3.88 MIL/uL — ABNORMAL LOW (ref 4.22–5.81)
RDW: 12.1 % (ref 11.5–15.5)
WBC: 6.4 10*3/uL (ref 4.0–10.5)

## 2014-06-15 LAB — BONE MARROW EXAM

## 2014-06-15 MED ORDER — MIDAZOLAM HCL 10 MG/2ML IJ SOLN
10.0000 mg | Freq: Once | INTRAMUSCULAR | Status: DC
  Filled 2014-06-15: qty 2

## 2014-06-15 MED ORDER — MEPERIDINE HCL 25 MG/ML IJ SOLN
INTRAMUSCULAR | Status: AC | PRN
  Administered 2014-06-15 (×2): 25 mg via INTRAVENOUS

## 2014-06-15 MED ORDER — HEPARIN SOD (PORK) LOCK FLUSH 100 UNIT/ML IV SOLN
500.0000 [IU] | Freq: Once | INTRAVENOUS | Status: AC
  Administered 2014-06-15: 500 [IU] via INTRAVENOUS
  Filled 2014-06-15: qty 5

## 2014-06-15 MED ORDER — SODIUM CHLORIDE 0.9 % IV SOLN
INTRAVENOUS | Status: DC
  Administered 2014-06-15: 08:00:00 via INTRAVENOUS

## 2014-06-15 MED ORDER — MEPERIDINE HCL 50 MG/ML IJ SOLN
50.0000 mg | Freq: Once | INTRAMUSCULAR | Status: DC
  Filled 2014-06-15: qty 1

## 2014-06-15 MED ORDER — MIDAZOLAM HCL 5 MG/5ML IJ SOLN
INTRAMUSCULAR | Status: AC | PRN
  Administered 2014-06-15 (×2): 1 mg via INTRAVENOUS
  Administered 2014-06-15: 0.5 mg via INTRAVENOUS
  Administered 2014-06-15: 2 mg via INTRAVENOUS
  Administered 2014-06-15: 1 mg via INTRAVENOUS

## 2014-06-15 MED ORDER — SODIUM CHLORIDE 0.9 % IJ SOLN
10.0000 mL | Freq: Once | INTRAMUSCULAR | Status: AC
  Administered 2014-06-15: 10 mL

## 2014-06-15 NOTE — Sedation Documentation (Signed)
MD at bedside. 

## 2014-06-15 NOTE — Sedation Documentation (Signed)
Family updated as to patient's status.

## 2014-06-15 NOTE — Procedures (Addendum)
Mr. Matsuo was brought to the short stay unit. This was at [facility] He had his Port-A-Cath accessed.  We did the appropriate time out procedure.  His Mallimpati score 1. His ASA class was 1.  Then placed onto his right side. He received a total of 5.5 mg of Versed and 50 mg of Demerol for IV sedation.  The left posterior leg crest region was prepped and draped in sterile fashion. Cc of 2% lidocaine was infiltrated under the skin and periosteum.  A scalpel was used make an incision to skin.  We obtained to 2 bone marrow aspirates. We use the combination aspirate and biopsy needle.  We then obtained an excellent bone marrow biopsy cor.  We cleaned and dressed the procedure site procedure well. He tolerated the procedure well. There were no complications.

## 2014-06-15 NOTE — Discharge Instructions (Signed)

## 2014-06-15 NOTE — Sedation Documentation (Signed)
Procedure complete. Patient rolled supine 

## 2014-06-16 ENCOUNTER — Ambulatory Visit (HOSPITAL_COMMUNITY)
Admission: RE | Admit: 2014-06-16 | Discharge: 2014-06-16 | Disposition: A | Discharge status: Home / Self Care | Payer: Commercial Managed Care - PPO | Source: Ambulatory Visit | Attending: Hematology & Oncology | Admitting: Hematology & Oncology

## 2014-06-16 DIAGNOSIS — J988 Other specified respiratory disorders: Secondary | ICD-10-CM | POA: Insufficient documentation

## 2014-06-16 DIAGNOSIS — C8195 Hodgkin lymphoma, unspecified, lymph nodes of inguinal region and lower limb: Secondary | ICD-10-CM

## 2014-06-16 DIAGNOSIS — Z01810 Encounter for preprocedural cardiovascular examination: Secondary | ICD-10-CM | POA: Insufficient documentation

## 2014-06-16 DIAGNOSIS — C8589 Other specified types of non-Hodgkin lymphoma, extranodal and solid organ sites: Secondary | ICD-10-CM | POA: Insufficient documentation

## 2014-06-16 DIAGNOSIS — C8190 Hodgkin lymphoma, unspecified, unspecified site: Secondary | ICD-10-CM

## 2014-06-16 MED ORDER — DEXAMETHASONE 4 MG PO TABS: 8.0000 mg | ORAL_TABLET | Freq: Two times a day (BID) | ORAL | Status: DC

## 2014-06-16 MED ORDER — LORAZEPAM 0.5 MG PO TABS: 0.5000 mg | ORAL_TABLET | Freq: Four times a day (QID) | ORAL | Status: DC | PRN

## 2014-06-16 MED ORDER — PROCHLORPERAZINE MALEATE 10 MG PO TABS: 10.0000 mg | ORAL_TABLET | Freq: Four times a day (QID) | ORAL | Status: DC | PRN

## 2014-06-16 MED ORDER — ALBUTEROL SULFATE (2.5 MG/3ML) 0.083% IN NEBU
2.5000 mg | INHALATION_SOLUTION | Freq: Once | RESPIRATORY_TRACT | Status: AC
  Administered 2014-06-16: 2.5 mg via RESPIRATORY_TRACT

## 2014-06-16 MED ORDER — ONDANSETRON HCL 8 MG PO TABS: 8.0000 mg | ORAL_TABLET | Freq: Two times a day (BID) | ORAL | Status: DC

## 2014-06-16 NOTE — Progress Notes (Signed)
  Echocardiogram 2D Echocardiogram has been performed.  Donata Clay 06/16/2014, 10:11 AM

## 2014-06-17 ENCOUNTER — Other Ambulatory Visit (HOSPITAL_BASED_OUTPATIENT_CLINIC_OR_DEPARTMENT_OTHER): Payer: Commercial Managed Care - PPO | Admitting: Lab

## 2014-06-17 ENCOUNTER — Ambulatory Visit (HOSPITAL_BASED_OUTPATIENT_CLINIC_OR_DEPARTMENT_OTHER): Payer: Commercial Managed Care - PPO

## 2014-06-17 ENCOUNTER — Encounter: Payer: Self-pay | Admitting: Hematology & Oncology

## 2014-06-17 VITALS — BP 124/76 | HR 73 | Temp 97.2°F | Resp 20

## 2014-06-17 DIAGNOSIS — M810 Age-related osteoporosis without current pathological fracture: Secondary | ICD-10-CM

## 2014-06-17 DIAGNOSIS — C8195 Hodgkin lymphoma, unspecified, lymph nodes of inguinal region and lower limb: Secondary | ICD-10-CM

## 2014-06-17 DIAGNOSIS — C8110 Nodular sclerosis classical Hodgkin lymphoma, unspecified site: Secondary | ICD-10-CM

## 2014-06-17 DIAGNOSIS — Z5112 Encounter for antineoplastic immunotherapy: Secondary | ICD-10-CM

## 2014-06-17 DIAGNOSIS — F5221 Male erectile disorder: Secondary | ICD-10-CM

## 2014-06-17 LAB — CMP (CANCER CENTER ONLY)
ALT: 28 U/L (ref 10–47)
AST: 21 U/L (ref 11–38)
Albumin: 3.8 g/dL (ref 3.3–5.5)
Alkaline Phosphatase: 72 U/L (ref 26–84)
BUN, Bld: 15 mg/dL (ref 7–22)
CALCIUM: 9.3 mg/dL (ref 8.0–10.3)
CHLORIDE: 101 meq/L (ref 98–108)
CO2: 31 meq/L (ref 18–33)
CREATININE: 1.1 mg/dL (ref 0.6–1.2)
Glucose, Bld: 81 mg/dL (ref 73–118)
POTASSIUM: 4.1 meq/L (ref 3.3–4.7)
Sodium: 139 mEq/L (ref 128–145)
Total Bilirubin: 0.6 mg/dl (ref 0.20–1.60)
Total Protein: 6.8 g/dL (ref 6.4–8.1)

## 2014-06-17 LAB — CBC WITH DIFFERENTIAL (CANCER CENTER ONLY)
BASO#: 0.1 10*3/uL (ref 0.0–0.2)
BASO%: 1.1 % (ref 0.0–2.0)
EOS ABS: 0.4 10*3/uL (ref 0.0–0.5)
EOS%: 5.8 % (ref 0.0–7.0)
HEMATOCRIT: 37.6 % — AB (ref 38.7–49.9)
HEMOGLOBIN: 13.1 g/dL (ref 13.0–17.1)
LYMPH#: 0.6 10*3/uL — AB (ref 0.9–3.3)
LYMPH%: 9.6 % — ABNORMAL LOW (ref 14.0–48.0)
MCH: 34.2 pg — ABNORMAL HIGH (ref 28.0–33.4)
MCHC: 34.8 g/dL (ref 32.0–35.9)
MCV: 98 fL (ref 82–98)
MONO#: 0.7 10*3/uL (ref 0.1–0.9)
MONO%: 10.4 % (ref 0.0–13.0)
NEUT%: 73.1 % (ref 40.0–80.0)
NEUTROS ABS: 4.6 10*3/uL (ref 1.5–6.5)
Platelets: 290 10*3/uL (ref 145–400)
RBC: 3.83 10*6/uL — AB (ref 4.20–5.70)
RDW: 11.8 % (ref 11.1–15.7)
WBC: 6.3 10*3/uL (ref 4.0–10.0)

## 2014-06-17 MED ORDER — ACETAMINOPHEN 325 MG PO TABS
650.0000 mg | ORAL_TABLET | Freq: Once | ORAL | Status: AC
  Administered 2014-06-17: 650 mg via ORAL

## 2014-06-17 MED ORDER — DIPHENHYDRAMINE HCL 50 MG/ML IJ SOLN
INTRAMUSCULAR | Status: AC
Start: 2014-06-17 — End: 2014-06-17
  Filled 2014-06-17: qty 1

## 2014-06-17 MED ORDER — ACETAMINOPHEN 325 MG PO TABS
ORAL_TABLET | ORAL | Status: AC
  Filled 2014-06-17: qty 2

## 2014-06-17 MED ORDER — DEXAMETHASONE SODIUM PHOSPHATE 10 MG/ML IJ SOLN
INTRAMUSCULAR | Status: AC
  Filled 2014-06-17: qty 1

## 2014-06-17 MED ORDER — BRENTUXIMAB VEDOTIN 50 MG IV SOLR
1.8000 mg/kg | Freq: Once | INTRAVENOUS | Status: AC
  Administered 2014-06-17: 145 mg via INTRAVENOUS
  Filled 2014-06-17: qty 29

## 2014-06-17 MED ORDER — ACETAMINOPHEN 325 MG PO TABS
ORAL_TABLET | ORAL | Status: AC
  Filled 2014-06-17: qty 1

## 2014-06-17 MED ORDER — SODIUM CHLORIDE 0.9 % IJ SOLN
10.0000 mL | INTRAMUSCULAR | Status: DC | PRN
  Administered 2014-06-17: 10 mL
  Filled 2014-06-17: qty 10

## 2014-06-17 MED ORDER — HEPARIN SOD (PORK) LOCK FLUSH 100 UNIT/ML IV SOLN
500.0000 [IU] | Freq: Once | INTRAVENOUS | Status: AC | PRN
  Administered 2014-06-17: 500 [IU]
  Filled 2014-06-17: qty 5

## 2014-06-17 MED ORDER — HEPARIN SOD (PORK) LOCK FLUSH 100 UNIT/ML IV SOLN
250.0000 [IU] | Freq: Once | INTRAVENOUS | Status: AC | PRN
  Filled 2014-06-17: qty 5

## 2014-06-17 MED ORDER — ONDANSETRON 8 MG/50ML IVPB (CHCC)
8.0000 mg | Freq: Once | INTRAVENOUS | Status: AC
  Administered 2014-06-17: 8 mg via INTRAVENOUS

## 2014-06-17 MED ORDER — DIPHENHYDRAMINE HCL 50 MG/ML IJ SOLN
50.0000 mg | Freq: Once | INTRAMUSCULAR | Status: AC
  Administered 2014-06-17: 50 mg via INTRAVENOUS

## 2014-06-17 MED ORDER — DEXAMETHASONE SODIUM PHOSPHATE 10 MG/ML IJ SOLN
10.0000 mg | Freq: Once | INTRAMUSCULAR | Status: AC
  Administered 2014-06-17: 10 mg via INTRAVENOUS

## 2014-06-17 MED ORDER — SODIUM CHLORIDE 0.9 % IV SOLN
Freq: Once | INTRAVENOUS | Status: AC
  Administered 2014-06-17: 12:00:00 via INTRAVENOUS

## 2014-06-17 NOTE — Patient Instructions (Signed)
Brian [name] Discharge Instructions for Patients Receiving Chemotherapy  Today you received the following chemotherapy agents Adcetris  To help prevent nausea and vomiting after your treatment, we encourage you to take your nausea medications as directed.   If you develop nausea and vomiting that is not controlled by your nausea medication, call the clinic.   BELOW ARE SYMPTOMS THAT SHOULD BE REPORTED IMMEDIATELY:  *FEVER GREATER THAN 100.5 F  *CHILLS WITH OR WITHOUT FEVER  NAUSEA AND VOMITING THAT IS NOT CONTROLLED WITH YOUR NAUSEA MEDICATION  *UNUSUAL SHORTNESS OF BREATH  *UNUSUAL BRUISING OR BLEEDING  TENDERNESS IN MOUTH AND THROAT WITH OR WITHOUT PRESENCE OF ULCERS  *URINARY PROBLEMS  *BOWEL PROBLEMS  UNUSUAL RASH Items with * indicate a potential emergency and should be followed up as soon as possible.  Feel free to call the clinic you have any questions or concerns. The clinic phone number is [phone].   Brentuximab vedotin solution for injection What is this medicine? BRENTUXIMAB VEDOTIN (bren TUX see mab ve DOE tin) is a chemotherapy drug. It targets a specific protein within cancer cells and stops the cancer cells from growing. This medicine is used for treating Hodgkin's lymphoma and certain non-Hodgkin's lymphomas, such as systemic anaplastic large cell lymphoma. This medicine may be used for other purposes; ask your health care provider or pharmacist if you have questions. COMMON BRAND NAME(S): ADCETRIS What should I tell my health care provider before I take this medicine? They need to know if you have any of these conditions: -immune system problems -infection (especially a virus infection such as chickenpox, cold sores, or herpes) -kidney disease -liver disease -low blood counts, like low white cell, platelet, or red cell counts -tingling of the fingers or toes, or other nerve disorder -an unusual or allergic reaction to brentuximab  vedotin, other medicines, foods, dyes, or preservatives -pregnant or trying to get pregnant -breast-feeding How should I use this medicine? This medicine is for infusion into a vein. It is given by a health care professional in a hospital or clinic setting. Talk to your pediatrician regarding the use of this medicine in children. Special care may be needed. Overdosage: If you think you've taken too much of this medicine contact a poison control center or emergency room at once. Overdosage: If you think you have taken too much of this medicine contact a poison control center or emergency room at once. NOTE: This medicine is only for you. Do not share this medicine with others. What if I miss a dose? It is important not to miss your dose. Call your doctor or health care professional if you are unable to keep an appointment. What may interact with this medicine? This medicine may interact with the following medications: -ketoconazole -rifampin -St. John's wort; Hypericum perforatum This list may not describe all possible interactions. Give your health care provider a list of all the medicines, herbs, non-prescription drugs, or dietary supplements you use. Also tell them if you smoke, drink alcohol, or use illegal drugs. Some items may interact with your medicine. What should I watch for while using this medicine? Visit your doctor for checks on your progress. This drug may make you feel generally unwell. This is not uncommon, as chemotherapy can affect healthy cells as well as cancer cells. Report any side effects. Continue your course of treatment even though you feel ill unless your doctor tells you to stop. Call your doctor or health care professional for advice if you get a fever,  chills or sore throat, or other symptoms of a cold or flu. Do not treat yourself. This drug decreases your body's ability to fight infections. Try to avoid being around people who are sick. This medicine may increase  your risk to bruise or bleed. Call your doctor or health care professional if you notice any unusual bleeding. In some patients, this medicine may cause a serious brain infection that may cause death. If you have any problems seeing, thinking, speaking, walking, or standing, tell your doctor right away. If you cannot reach your doctor, urgently seek other source of medical care. Do not become pregnant while taking this medicine. Women should inform their doctor if they wish to become pregnant or think they might be pregnant. There is a potential for serious side effects to an unborn child. Talk to your health care professional or pharmacist for more information. Do not breast-feed an infant while taking this medicine. What side effects may I notice from receiving this medicine? Side effects that you should report to your doctor or health care professional as soon as possible: -allergic reactions like skin rash, itching or hives, swelling of the face, lips, or tongue -changes in emotions or moods -low blood counts - this medicine may decrease the number of white blood cells, red blood cells and platelets. You may be at increased risk for infections and bleeding. -pain, tingling, numbness in the hands or feet -redness, blistering, peeling or loosening of the skin, including inside the mouth -shortness of breath -signs of infection - fever or chills, cough, sore throat, pain or difficulty passing urine -signs of decreased platelets or bleeding - bruising, pinpoint red spots on the skin, black, tarry stools, blood in the urine -signs of decreased red blood cells - unusually weak or tired, fainting spells, lightheadedness -signs of liver injury like dark yellow or brown urine; general ill feeling or flu-like symptoms; light-colored stools; loss of appetite; nausea; right upper belly pain; yellowing of the eyes or skin -stomach pain -sudden numbness or weakness of the face, arm or leg Side effects that  usually do not require medical attention (Report these to your doctor or health care professional if they continue or are bothersome): -diarrhea -dizziness -headache -muscle pain -nausea, vomiting -tiredness This list may not describe all possible side effects. Call your doctor for medical advice about side effects. You may report side effects to FDA at 1-800-FDA-1088. Where should I keep my medicine? This drug is given in a hospital or clinic and will not be stored at home. NOTE: This sheet is a summary. It may not cover all possible information. If you have questions about this medicine, talk to your doctor, pharmacist, or health care provider.  2015, Elsevier/Gold Standard. (2014-02-24 16:52:15)

## 2014-06-18 LAB — LACTATE DEHYDROGENASE: LDH: 139 U/L (ref 94–250)

## 2014-06-18 LAB — VITAMIN D 25 HYDROXY (VIT D DEFICIENCY, FRACTURES): VIT D 25 HYDROXY: 52 ng/mL (ref 30–89)

## 2014-06-18 LAB — TESTOSTERONE: TESTOSTERONE: 187 ng/dL — AB (ref 300–890)

## 2014-06-20 LAB — PULMONARY FUNCTION TEST
DL/VA % PRED: 93 %
DL/VA: 4.35 ml/min/mmHg/L
DLCO UNC % PRED: 91 %
DLCO cor % pred: 97 %
DLCO cor: 31.46 ml/min/mmHg
DLCO unc: 29.74 ml/min/mmHg
FEF 25-75 Post: 2.15 L/sec
FEF 25-75 Pre: 2.03 L/sec
FEF2575-%Change-Post: 6 %
FEF2575-%Pred-Post: 65 %
FEF2575-%Pred-Pre: 61 %
FEV1-%CHANGE-POST: 2 %
FEV1-%PRED-POST: 77 %
FEV1-%Pred-Pre: 75 %
FEV1-POST: 2.93 L
FEV1-Pre: 2.86 L
FEV1FVC-%CHANGE-POST: 3 %
FEV1FVC-%Pred-Pre: 69 %
FEV6-%Change-Post: 0 %
FEV6-%PRED-PRE: 108 %
FEV6-%Pred-Post: 109 %
FEV6-PRE: 5.15 L
FEV6-Post: 5.2 L
FEV6FVC-%Change-Post: 0 %
FEV6FVC-%PRED-POST: 102 %
FEV6FVC-%PRED-PRE: 102 %
FVC-%Change-Post: 0 %
FVC-%PRED-PRE: 107 %
FVC-%Pred-Post: 106 %
FVC-Post: 5.29 L
FVC-Pre: 5.31 L
PRE FEV6/FVC RATIO: 99 %
Post FEV1/FVC ratio: 56 %
Post FEV6/FVC ratio: 98 %
Pre FEV1/FVC ratio: 54 %
RV % PRED: 80 %
RV: 1.7 L
TLC % pred: 104 %
TLC: 7.28 L

## 2014-06-22 ENCOUNTER — Ambulatory Visit (INDEPENDENT_AMBULATORY_CARE_PROVIDER_SITE_OTHER): Payer: Self-pay | Admitting: Thoracic Surgery (Cardiothoracic Vascular Surgery)

## 2014-06-22 ENCOUNTER — Encounter: Payer: Self-pay | Admitting: Hematology & Oncology

## 2014-06-22 ENCOUNTER — Encounter: Payer: Self-pay | Admitting: Thoracic Surgery (Cardiothoracic Vascular Surgery)

## 2014-06-22 ENCOUNTER — Encounter: Payer: Self-pay | Admitting: Nurse Practitioner

## 2014-06-22 VITALS — BP 124/82 | HR 100 | Temp 98.7°F | Resp 20 | Ht 70.0 in | Wt 172.0 lb

## 2014-06-22 DIAGNOSIS — C8190 Hodgkin lymphoma, unspecified, unspecified site: Secondary | ICD-10-CM

## 2014-06-22 DIAGNOSIS — Z09 Encounter for follow-up examination after completed treatment for conditions other than malignant neoplasm: Secondary | ICD-10-CM

## 2014-06-22 DIAGNOSIS — J948 Other specified pleural conditions: Secondary | ICD-10-CM

## 2014-06-22 DIAGNOSIS — R222 Localized swelling, mass and lump, trunk: Secondary | ICD-10-CM

## 2014-06-22 LAB — CHROMOSOME ANALYSIS, BONE MARROW

## 2014-06-22 NOTE — Progress Notes (Signed)
HPI:  [name] is a 53 year old gentleman who underwent a left VATS and resection of a pleural mass on July 7. The mass turned out to be Hodgkin's lymphoma. He has started treatment.  He really didn't have much pain for the first week or so, however during the second week he noticed what he felt was swelling and hypersensitivity in the left upper quadrant of his abdomen. He was concerned that it is spleen or sign of infection. He says that the sensation really bothers him and makes it difficult to concentrate. He has much more discomfort with light touch that he does with deep palpation. He sometimes experience as a pins and needles type of sensation as well.  He says the hydrocodone and abdomen been ineffective. The only thing he has found that works consistently is to ice it.  Past Medical History  Diagnosis Date  . Wears contact lenses   . H/O autologous stem cell transplant     may 2014  at [facility]  . History of peptic ulcer   . Mass of right inguinal region   . History of Bell's palsy     2009  RIGHT SIDE--  HAS 80% FUNCTION / PT STATES A LITTLE ASYMETRICAL AND EFFECTS MOUTH  . Hodgkin's disease, nodular sclerosis, of inguinal region/lower limb ONOLOGIST--  DR [facility] AND A [facility]      SALVAGE CHEMO 2013/  AUTOLOGUS STEM CELL TRANSPLANT MAY 2014 AT [facility]  . Dysrhythmia     past hx pvc  . Anxiety   . PVC (premature ventricular contraction)     "benign"     Current Outpatient Prescriptions  Medication Sig Dispense Refill  . acetaminophen (TYLENOL) 325 MG tablet Take 325 mg by mouth every 6 (six) hours as needed for mild pain.      Marland Kitchen ALPRAZolam (XANAX) 0.5 MG tablet Take 0.5 mg by mouth daily as needed for anxiety or sleep.      . Cholecalciferol (VITAMIN D) 1000 UNITS capsule Take 1,000 Units by mouth 2 (two) times daily.       . citalopram (CELEXA) 20 MG tablet Take 1 tablet (20 mg total) by mouth daily.  30 tablet  3  . dexamethasone (DECADRON) 4 MG tablet Take 2 tablets (8 mg  total) by mouth 2 (two) times daily with a meal. Start the day after chemotherapy for 2 days. Take with food.  30 tablet  1  . HYDROcodone-acetaminophen (NORCO) 5-325 MG per tablet Take 1-2 tablets by mouth every 4 (four) hours as needed for severe pain.  30 tablet  0  . ibuprofen (ADVIL,MOTRIN) 200 MG tablet Take 200-400 mg by mouth every 6 (six) hours as needed for mild pain.       Marland Kitchen lidocaine-prilocaine (EMLA) cream Apply 1 application topically as needed. Apply nickel sized amount to portacath site at least 1 hour prior to chemotherapy.  30 g  1  . LORazepam (ATIVAN) 0.5 MG tablet Take 1 tablet (0.5 mg total) by mouth every 6 (six) hours as needed (Nausea or vomiting).  30 tablet  0  . LORazepam (ATIVAN) 1 MG tablet Take 0.5-1 mg by mouth 2 (two) times daily as needed for anxiety or sleep.      . Multiple Vitamin (MULTIVITAMIN) tablet Take 1 tablet by mouth daily.      . ondansetron (ZOFRAN) 8 MG tablet Take 1 tablet (8 mg total) by mouth 2 (two) times daily. Start the day after chemo for 2 days. Then take as needed  for nausea or vomiting.  30 tablet  1  . prochlorperazine (COMPAZINE) 10 MG tablet Take 1 tablet (10 mg total) by mouth every 6 (six) hours as needed (Nausea or vomiting).  30 tablet  1   No current facility-administered medications for this visit.   Facility-Administered Medications Ordered in Other Visits  Medication Dose Route Frequency Provider Last Rate Last Dose  . sodium chloride 0.9 % injection 10 mL  10 mL Intracatheter PRN [name], MD   10 mL at 06/17/14 1355    Physical Exam BP 124/82  Pulse 100  Temp(Src) 98.7 F (37.1 C)  Resp 20  Ht 5\' 10"  (1.778 m)  Wt 172 lb (78.019 kg)  BMI 24.68 kg/m2  SpO17 65% 53 year old man in no acute distress Incision and chest tube site well-healed Muscular laxity in the left upper quadrant just below the costal margin  Impression: [name] is experiencing typical discomfort that is common after thoracic surgery. This is  due to irritation of the intercostal nerves which supplied that area. This combination of numbness and discomfort and hypersensitivity to light touch is characteristic. It should improve with time. In the meantime if I seen is the most effective thing I recommended that he continue to do that. He had questions about trying heat rubs or heating pads. If he was to try that it is fine and it will not cause any damage, but it may not be as effective as the ice.  We discussed potentially trying a medication such as gabapentin or Lyrica. He does not want to do that currently as he is already taking  a number of medications including Celexa.  Plan:  I will plan to see him back in one month to check on his progress

## 2014-06-22 NOTE — Progress Notes (Signed)
Pt called to inform us that he was having some swelling, pain, discomfort near biopsy site and mid unbilical area. He did contact the surgeon whom he stated informed him that it was not of concern and it was possible to still have some nerve pain in those areas. He denies any fever, chills, rash, redness, discoloration, or drainage at the the site or any surrounding area. Dr. Marin Olp is aware and agrees with surgeon. Pt has been advised if symptoms worsen, he develops a fever, redness, or irritation to contact surgeons office as well as our office. He verbalized understanding and appreciation.

## 2014-06-23 ENCOUNTER — Telehealth: Payer: Self-pay | Admitting: Hematology & Oncology

## 2014-06-23 NOTE — Telephone Encounter (Signed)
[place]  [employer] [facility]:       [phone] Ph:  [phone] ext [phone]  PATIENT: BENFORD ASCH AUTH: [id number] VALID: 06/17/2014 - 08/25/2014    CHEMO: [id number]  Adcetris 135mg  chemo infusion

## 2014-06-25 ENCOUNTER — Ambulatory Visit (HOSPITAL_BASED_OUTPATIENT_CLINIC_OR_DEPARTMENT_OTHER): Payer: Commercial Managed Care - PPO

## 2014-06-25 DIAGNOSIS — M792 Neuralgia and neuritis, unspecified: Secondary | ICD-10-CM

## 2014-06-25 DIAGNOSIS — C8195 Hodgkin lymphoma, unspecified, lymph nodes of inguinal region and lower limb: Secondary | ICD-10-CM

## 2014-06-25 DIAGNOSIS — IMO0002 Reserved for concepts with insufficient information to code with codable children: Secondary | ICD-10-CM

## 2014-06-25 MED ORDER — TESTOSTERONE CYPIONATE 200 MG/ML IM SOLN
400.0000 mg | Freq: Once | INTRAMUSCULAR | Status: AC
  Administered 2014-06-25: 400 mg via INTRAMUSCULAR

## 2014-06-25 MED ORDER — SODIUM CHLORIDE 0.9 % IV SOLN
1000.0000 mL | Freq: Once | INTRAVENOUS | Status: AC
  Administered 2014-06-25: 1000 mL via INTRAVENOUS

## 2014-06-25 MED ORDER — TESTOSTERONE CYPIONATE 200 MG/ML IM SOLN
INTRAMUSCULAR | Status: AC
  Filled 2014-06-25: qty 2

## 2014-06-25 MED ORDER — LIDOCAINE 5 % EX PTCH: 1.0000 | MEDICATED_PATCH | CUTANEOUS | Status: DC

## 2014-06-25 NOTE — Patient Instructions (Signed)
Dehydration, Adult Dehydration is when you lose more fluids from the body than you take in. Vital organs like the kidneys, brain, and heart cannot function without a proper amount of fluids and salt. Any loss of fluids from the body can cause dehydration.  CAUSES   Vomiting.  Diarrhea.  Excessive sweating.  Excessive urine output.  Fever. SYMPTOMS  Mild dehydration  Thirst.  Dry lips.  Slightly dry mouth. Moderate dehydration  Very dry mouth.  Sunken eyes.  Skin does not bounce back quickly when lightly pinched and released.  Dark urine and decreased urine production.  Decreased tear production.  Headache. Severe dehydration  Very dry mouth.  Extreme thirst.  Rapid, weak pulse (more than 100 beats per minute at rest).  Cold hands and feet.  Not able to sweat in spite of heat and temperature.  Rapid breathing.  Blue lips.  Confusion and lethargy.  Difficulty being awakened.  Minimal urine production.  No tears. DIAGNOSIS  Your caregiver will diagnose dehydration based on your symptoms and your exam. Blood and urine tests will help confirm the diagnosis. The diagnostic evaluation should also identify the cause of dehydration. TREATMENT  Treatment of mild or moderate dehydration can often be done at home by increasing the amount of fluids that you drink. It is best to drink small amounts of fluid more often. Drinking too much at one time can make vomiting worse. Refer to the home care instructions below. Severe dehydration needs to be treated at the hospital where you will probably be given intravenous (IV) fluids that contain water and electrolytes. HOME CARE INSTRUCTIONS   Ask your caregiver about specific rehydration instructions.  Drink enough fluids to keep your urine clear or pale yellow.  Drink small amounts frequently if you have nausea and vomiting.  Eat as you normally do.  Avoid:  Foods or drinks high in sugar.  Carbonated  drinks.  Juice.  Extremely hot or cold fluids.  Drinks with caffeine.  Fatty, greasy foods.  Alcohol.  Tobacco.  Overeating.  Gelatin desserts.  Wash your hands well to avoid spreading bacteria and viruses.  Only take over-the-counter or prescription medicines for pain, discomfort, or fever as directed by your caregiver.  Ask your caregiver if you should continue all prescribed and over-the-counter medicines.  Keep all follow-up appointments with your caregiver. SEEK MEDICAL CARE IF:  You have abdominal pain and it increases or stays in one area (localizes).  You have a rash, stiff neck, or severe headache.  You are irritable, sleepy, or difficult to awaken.  You are weak, dizzy, or extremely thirsty. SEEK IMMEDIATE MEDICAL CARE IF:   You are unable to keep fluids down or you get worse despite treatment.  You have frequent episodes of vomiting or diarrhea.  You have blood or green matter (bile) in your vomit.  You have blood in your stool or your stool looks black and tarry.  You have not urinated in 6 to 8 hours, or you have only urinated a small amount of very dark urine.  You have a fever.  You faint. MAKE SURE YOU:   Understand these instructions.  Will watch your condition.  Will get help right away if you are not doing well or get worse. Document Released: 11/12/2005 Document Revised: 02/04/2012 Document Reviewed: 07/02/2011 ExitCare Patient Information 2015 [employer]. This information is not intended to replace advice given to you by your health care provider. Make sure you discuss any questions you have with your health care   provider.  

## 2014-06-28 ENCOUNTER — Encounter: Payer: Self-pay | Admitting: Hematology & Oncology

## 2014-07-08 ENCOUNTER — Other Ambulatory Visit (HOSPITAL_BASED_OUTPATIENT_CLINIC_OR_DEPARTMENT_OTHER): Payer: Commercial Managed Care - PPO | Admitting: Lab

## 2014-07-08 ENCOUNTER — Ambulatory Visit (HOSPITAL_BASED_OUTPATIENT_CLINIC_OR_DEPARTMENT_OTHER): Payer: Commercial Managed Care - PPO | Admitting: Hematology & Oncology

## 2014-07-08 ENCOUNTER — Encounter: Payer: Self-pay | Admitting: Hematology & Oncology

## 2014-07-08 ENCOUNTER — Ambulatory Visit (HOSPITAL_BASED_OUTPATIENT_CLINIC_OR_DEPARTMENT_OTHER): Payer: Commercial Managed Care - PPO

## 2014-07-08 VITALS — BP 123/79 | HR 81 | Temp 98.1°F | Resp 18 | Ht 70.0 in | Wt 173.0 lb

## 2014-07-08 DIAGNOSIS — C8195 Hodgkin lymphoma, unspecified, lymph nodes of inguinal region and lower limb: Secondary | ICD-10-CM

## 2014-07-08 DIAGNOSIS — C8190 Hodgkin lymphoma, unspecified, unspecified site: Secondary | ICD-10-CM

## 2014-07-08 DIAGNOSIS — Z5112 Encounter for antineoplastic immunotherapy: Secondary | ICD-10-CM

## 2014-07-08 LAB — CBC WITH DIFFERENTIAL (CANCER CENTER ONLY)
BASO#: 0 10*3/uL (ref 0.0–0.2)
BASO%: 0.7 % (ref 0.0–2.0)
EOS ABS: 0.3 10*3/uL (ref 0.0–0.5)
EOS%: 7.7 % — ABNORMAL HIGH (ref 0.0–7.0)
HCT: 40.6 % (ref 38.7–49.9)
HGB: 13.6 g/dL (ref 13.0–17.1)
LYMPH#: 0.8 10*3/uL — ABNORMAL LOW (ref 0.9–3.3)
LYMPH%: 20.2 % (ref 14.0–48.0)
MCH: 32.9 pg (ref 28.0–33.4)
MCHC: 33.5 g/dL (ref 32.0–35.9)
MCV: 98 fL (ref 82–98)
MONO#: 0.8 10*3/uL (ref 0.1–0.9)
MONO%: 18.8 % — ABNORMAL HIGH (ref 0.0–13.0)
NEUT%: 52.6 % (ref 40.0–80.0)
NEUTROS ABS: 2.1 10*3/uL (ref 1.5–6.5)
Platelets: 198 10*3/uL (ref 145–400)
RBC: 4.13 10*6/uL — AB (ref 4.20–5.70)
RDW: 12.6 % (ref 11.1–15.7)
WBC: 4.1 10*3/uL (ref 4.0–10.0)

## 2014-07-08 LAB — LACTATE DEHYDROGENASE: LDH: 152 U/L (ref 94–250)

## 2014-07-08 LAB — CMP (CANCER CENTER ONLY)
ALBUMIN: 3.8 g/dL (ref 3.3–5.5)
ALT: 34 U/L (ref 10–47)
AST: 24 U/L (ref 11–38)
Alkaline Phosphatase: 66 U/L (ref 26–84)
BUN, Bld: 15 mg/dL (ref 7–22)
CHLORIDE: 98 meq/L (ref 98–108)
CO2: 30 meq/L (ref 18–33)
CREATININE: 1.1 mg/dL (ref 0.6–1.2)
Calcium: 9.1 mg/dL (ref 8.0–10.3)
Glucose, Bld: 75 mg/dL (ref 73–118)
POTASSIUM: 4.2 meq/L (ref 3.3–4.7)
SODIUM: 143 meq/L (ref 128–145)
TOTAL PROTEIN: 7 g/dL (ref 6.4–8.1)
Total Bilirubin: 0.7 mg/dl (ref 0.20–1.60)

## 2014-07-08 MED ORDER — ONDANSETRON 8 MG/50ML IVPB (CHCC)
8.0000 mg | Freq: Once | INTRAVENOUS | Status: AC
  Administered 2014-07-08: 8 mg via INTRAVENOUS

## 2014-07-08 MED ORDER — HEPARIN SOD (PORK) LOCK FLUSH 100 UNIT/ML IV SOLN
500.0000 [IU] | Freq: Once | INTRAVENOUS | Status: AC | PRN
  Administered 2014-07-08: 500 [IU]
  Filled 2014-07-08: qty 5

## 2014-07-08 MED ORDER — ACETAMINOPHEN 325 MG PO TABS
650.0000 mg | ORAL_TABLET | Freq: Once | ORAL | Status: AC
Start: 2014-07-08 — End: 2014-07-08
  Administered 2014-07-08: 650 mg via ORAL

## 2014-07-08 MED ORDER — DIPHENHYDRAMINE HCL 50 MG/ML IJ SOLN
INTRAMUSCULAR | Status: AC
  Filled 2014-07-08: qty 1

## 2014-07-08 MED ORDER — DEXAMETHASONE SODIUM PHOSPHATE 10 MG/ML IJ SOLN
INTRAMUSCULAR | Status: AC
  Filled 2014-07-08: qty 1

## 2014-07-08 MED ORDER — DIPHENHYDRAMINE HCL 50 MG/ML IJ SOLN
50.0000 mg | Freq: Once | INTRAMUSCULAR | Status: AC
  Administered 2014-07-08: 50 mg via INTRAVENOUS

## 2014-07-08 MED ORDER — DEXAMETHASONE SODIUM PHOSPHATE 10 MG/ML IJ SOLN
10.0000 mg | Freq: Once | INTRAMUSCULAR | Status: AC
  Administered 2014-07-08: 10 mg via INTRAVENOUS

## 2014-07-08 MED ORDER — SODIUM CHLORIDE 0.9 % IV SOLN
1.8000 mg/kg | Freq: Once | INTRAVENOUS | Status: AC
  Administered 2014-07-08: 145 mg via INTRAVENOUS
  Filled 2014-07-08: qty 29

## 2014-07-08 MED ORDER — SODIUM CHLORIDE 0.9 % IJ SOLN
10.0000 mL | INTRAMUSCULAR | Status: DC | PRN
  Administered 2014-07-08: 10 mL
  Filled 2014-07-08: qty 10

## 2014-07-08 MED ORDER — SODIUM CHLORIDE 0.9 % IV SOLN
Freq: Once | INTRAVENOUS | Status: AC
  Administered 2014-07-08: 10:00:00 via INTRAVENOUS

## 2014-07-08 MED ORDER — ACETAMINOPHEN 325 MG PO TABS
ORAL_TABLET | ORAL | Status: AC
Start: 2014-07-08 — End: 2014-07-08
  Filled 2014-07-08: qty 2

## 2014-07-08 MED ORDER — DIPHENHYDRAMINE HCL 25 MG PO CAPS
ORAL_CAPSULE | ORAL | Status: AC
  Filled 2014-07-08: qty 2

## 2014-07-08 NOTE — Patient Instructions (Signed)
[facility] Discharge Instructions for Patients Receiving Chemotherapy  Today you received the following chemotherapy agents Adcetris  To help prevent nausea and vomiting after your treatment, we encourage you to take your nausea medications as directed.   If you develop nausea and vomiting that is not controlled by your nausea medication, call the clinic.   BELOW ARE SYMPTOMS THAT SHOULD BE REPORTED IMMEDIATELY:  *FEVER GREATER THAN 100.5 F  *CHILLS WITH OR WITHOUT FEVER  NAUSEA AND VOMITING THAT IS NOT CONTROLLED WITH YOUR NAUSEA MEDICATION  *UNUSUAL SHORTNESS OF BREATH  *UNUSUAL BRUISING OR BLEEDING  TENDERNESS IN MOUTH AND THROAT WITH OR WITHOUT PRESENCE OF ULCERS  *URINARY PROBLEMS  *BOWEL PROBLEMS  UNUSUAL RASH Items with * indicate a potential emergency and should be followed up as soon as possible.   Feel free to call the clinic you have any questions or concerns. The clinic phone number is [phone].   Brentuximab vedotin solution for injection What is this medicine? BRENTUXIMAB VEDOTIN (bren TUX see mab ve DOE tin) is a chemotherapy drug. It targets a specific protein within cancer cells and stops the cancer cells from growing. This medicine is used for treating Hodgkin's lymphoma and certain non-Hodgkin's lymphomas, such as systemic anaplastic large cell lymphoma. This medicine may be used for other purposes; ask your health care provider or pharmacist if you have questions. COMMON BRAND NAME(S): ADCETRIS What should I tell my health care provider before I take this medicine? They need to know if you have any of these conditions: -immune system problems -infection (especially a virus infection such as chickenpox, cold sores, or herpes) -kidney disease -liver disease -low blood counts, like low white cell, platelet, or red cell counts -tingling of the fingers or toes, or other nerve disorder -an unusual or allergic reaction to brentuximab  vedotin, other medicines, foods, dyes, or preservatives -pregnant or trying to get pregnant -breast-feeding How should I use this medicine? This medicine is for infusion into a vein. It is given by a health care professional in a hospital or clinic setting. Talk to your pediatrician regarding the use of this medicine in children. Special care may be needed. Overdosage: If you think you've taken too much of this medicine contact a poison control center or emergency room at once. Overdosage: If you think you have taken too much of this medicine contact a poison control center or emergency room at once. NOTE: This medicine is only for you. Do not share this medicine with others. What if I miss a dose? It is important not to miss your dose. Call your doctor or health care professional if you are unable to keep an appointment. What may interact with this medicine? This medicine may interact with the following medications: -ketoconazole -rifampin -St. John's wort; Hypericum perforatum This list may not describe all possible interactions. Give your health care provider a list of all the medicines, herbs, non-prescription drugs, or dietary supplements you use. Also tell them if you smoke, drink alcohol, or use illegal drugs. Some items may interact with your medicine. What should I watch for while using this medicine? Visit your doctor for checks on your progress. This drug may make you feel generally unwell. This is not uncommon, as chemotherapy can affect healthy cells as well as cancer cells. Report any side effects. Continue your course of treatment even though you feel ill unless your doctor tells you to stop. Call your doctor or health care professional for advice if you get a  fever, chills or sore throat, or other symptoms of a cold or flu. Do not treat yourself. This drug decreases your body's ability to fight infections. Try to avoid being around people who are sick. This medicine may increase  your risk to bruise or bleed. Call your doctor or health care professional if you notice any unusual bleeding. In some patients, this medicine may cause a serious brain infection that may cause death. If you have any problems seeing, thinking, speaking, walking, or standing, tell your doctor right away. If you cannot reach your doctor, urgently seek other source of medical care. Do not become pregnant while taking this medicine. Women should inform their doctor if they wish to become pregnant or think they might be pregnant. There is a potential for serious side effects to an unborn child. Talk to your health care professional or pharmacist for more information. Do not breast-feed an infant while taking this medicine. What side effects may I notice from receiving this medicine? Side effects that you should report to your doctor or health care professional as soon as possible: -allergic reactions like skin rash, itching or hives, swelling of the face, lips, or tongue -changes in emotions or moods -low blood counts - this medicine may decrease the number of white blood cells, red blood cells and platelets. You may be at increased risk for infections and bleeding. -pain, tingling, numbness in the hands or feet -redness, blistering, peeling or loosening of the skin, including inside the mouth -shortness of breath -signs of infection - fever or chills, cough, sore throat, pain or difficulty passing urine -signs of decreased platelets or bleeding - bruising, pinpoint red spots on the skin, black, tarry stools, blood in the urine -signs of decreased red blood cells - unusually weak or tired, fainting spells, lightheadedness -signs of liver injury like dark yellow or brown urine; general ill feeling or flu-like symptoms; light-colored stools; loss of appetite; nausea; right upper belly pain; yellowing of the eyes or skin -stomach pain -sudden numbness or weakness of the face, arm or leg Side effects that  usually do not require medical attention (Report these to your doctor or health care professional if they continue or are bothersome): -diarrhea -dizziness -headache -muscle pain -nausea, vomiting -tiredness This list may not describe all possible side effects. Call your doctor for medical advice about side effects. You may report side effects to FDA at 1-800-FDA-1088. Where should I keep my medicine? This drug is given in a hospital or clinic and will not be stored at home. NOTE: This sheet is a summary. It may not cover all possible information. If you have questions about this medicine, talk to your doctor, pharmacist, or health care provider.  2015, [employer]vier/[employer]. (2014-02-24 16:52:15)

## 2014-07-09 NOTE — Progress Notes (Signed)
Hematology and Oncology Follow Up Visit  SAGAR TENGAN [id number] Mar 14, 1961 53 y.o. 07/09/2014     recurrent Hodgkin's disease-post autologous transplant   Current Therapy:    status post 1 cycle of Adcetris     Interim History:  [name] is  back for followup. He tolerated his first cycle of treatment well. He really had very few, if any side effects.  I did give him a dose of testosterone. His testosterone level and a low side. He is feeling fatigued and weak. I thought that supplemental testosterone would be helpful. He says he thought it made helped a little bit.  I did have the pathologist stay his recurrence was CD 56. His Hodgkin's disease was negative for CD56. This is a little bit surprising.  He's had no cough. He's had some numbness over the left lateral chest wall. This is where he had his surgery. There is no much pain there right now. He has had no problems with bowels or bladder. He's had no rashes. He's had no obvious swollen lymph nodes. He's had no visual issues. He's had no headache.  Medications: Current outpatient prescriptions:acetaminophen (TYLENOL) 325 MG tablet, Take 325 mg by mouth every 6 (six) hours as needed for mild pain., Disp: , Rfl: ;  ALPRAZolam (XANAX) 0.5 MG tablet, Take 0.5 mg by mouth daily as needed for anxiety or sleep., Disp: , Rfl: ;  Cholecalciferol (VITAMIN D) 1000 UNITS capsule, Take 1,000 Units by mouth 2 (two) times daily. , Disp: , Rfl:  citalopram (CELEXA) 20 MG tablet, Take 1 tablet (20 mg total) by mouth daily., Disp: 30 tablet, Rfl: 3;  dexamethasone (DECADRON) 4 MG tablet, Take 2 tablets (8 mg total) by mouth 2 (two) times daily with a meal. Start the day after chemotherapy for 2 days. Take with food., Disp: 30 tablet, Rfl: 1;  ibuprofen (ADVIL,MOTRIN) 200 MG tablet, Take 200-400 mg by mouth every 6 (six) hours as needed for mild pain. , Disp: , Rfl:  lidocaine (LIDODERM) 5 %, Place 1 patch onto the skin daily. Remove & Discard patch within 12  hours or as directed by MD, Disp: 30 patch, Rfl: 3;  lidocaine-prilocaine (EMLA) cream, Apply 1 application topically as needed. Apply nickel sized amount to portacath site at least 1 hour prior to chemotherapy., Disp: 30 g, Rfl: 1 LORazepam (ATIVAN) 0.5 MG tablet, Take 1 tablet (0.5 mg total) by mouth every 6 (six) hours as needed (Nausea or vomiting)., Disp: 30 tablet, Rfl: 0;  LORazepam (ATIVAN) 1 MG tablet, Take 0.5-1 mg by mouth 2 (two) times daily as needed for anxiety or sleep., Disp: , Rfl: ;  Multiple Vitamin (MULTIVITAMIN) tablet, Take 1 tablet by mouth daily., Disp: , Rfl:  ondansetron (ZOFRAN) 8 MG tablet, Take 1 tablet (8 mg total) by mouth 2 (two) times daily. Start the day after chemo for 2 days. Then take as needed for nausea or vomiting., Disp: 30 tablet, Rfl: 1;  prochlorperazine (COMPAZINE) 10 MG tablet, Take 1 tablet (10 mg total) by mouth every 6 (six) hours as needed (Nausea or vomiting)., Disp: 30 tablet, Rfl: 1 No current facility-administered medications for this visit. Facility-Administered Medications Ordered in Other Visits: sodium chloride 0.9 % injection 10 mL, 10 mL, Intracatheter, PRN, [name], MD, 10 mL at 06/17/14 1355  Allergies: No Known Allergies  Past Medical History, Surgical history, Social history, and Family History were reviewed and updated.  Review of Systems:  as above   Physical Exam:  height is 5\' 10"  (1.778 m) and weight is 173 lb (78.472 kg). His oral temperature is 98.1 F (36.7 C). His blood pressure is 123/79 and his pulse is 81. His respiration is 18.    l well-developed and well-nourished white male. Head and neck exam shows no ocular or oral lesions. He has no palpable cervical or supraclavicular lymph nodes. Lungs are clear. Cardiac exam regular heirloom. Abdomen soft. Has good bowel sounds. There is no fluid wave. There is no palpable liver or spleen tip. Back exam shows a thoracoscopy scar in the left lateral chest wall. There is a  well-healed. Extremities shows no clubbing cyanosis or edema. Skin exam no rashes. Neurological exam is nonfocal.  Lab Results  Component Value Date   WBC 4.1 07/08/2014   HGB 13.6 07/08/2014   HCT 40.6 07/08/2014   MCV 98 07/08/2014   PLT 198 07/08/2014     Chemistry      Component Value Date/Time   NA 143 07/08/2014 0840   NA 141 06/02/2014 0335   NA 142 03/25/2013 0828   K 4.2 07/08/2014 0840   K 4.0 06/02/2014 0335   K 4.1 03/25/2013 0828   CL 98 07/08/2014 0840   CL 101 06/02/2014 0335   CL 107 03/25/2013 0828   CO2 30 07/08/2014 0840   CO2 27 06/02/2014 0335   CO2 27 03/25/2013 0828   BUN 15 07/08/2014 0840   BUN 16 06/02/2014 0335   BUN 12.7 03/25/2013 0828   CREATININE 1.1 07/08/2014 0840   CREATININE 1.00 06/02/2014 0335   CREATININE 0.9 03/25/2013 0828      Component Value Date/Time   CALCIUM 9.1 07/08/2014 0840   CALCIUM 9.3 06/02/2014 0335   CALCIUM 9.2 03/25/2013 0828   ALKPHOS 66 07/08/2014 0840   ALKPHOS 71 06/02/2014 0335   ALKPHOS 122 03/25/2013 0828   AST 24 07/08/2014 0840   AST 24 06/02/2014 0335   AST 33 03/25/2013 0828   ALT 34 07/08/2014 0840   ALT 20 06/02/2014 0335   ALT 65* 03/25/2013 0828   BILITOT 0.70 07/08/2014 0840   BILITOT 0.5 06/02/2014 0335   BILITOT 0.31 03/25/2013 0828         Impression and Plan: Mr. Temme is  53 year old abdomen. He has a second relapse of his Hodgkin's disease. He had an autologous transplant back in June of 2014. If at that he now has relapsed again is certainly very troublesome.  We are trying to see about an allogeneic transplant. However, he is not sure he wants to go through this. He just does not know if the risk is worth the benefit. He does not want invasive for a long time. He just has seen a few patients have trouble with allogeneic transplant complications.  He does have some siblings that could potentially be donors. I don't know if they haven't taken or not. They should have been. I have spoken to his transplant doctor and at [facility]. I will  call him again.  [place] Mr. come back here in another 3 weeks for his third cycle of treatment.  I will give him 4 cycles and then re\re scan him and see how everything looks.  I spent about 30 minutes with he and his wife. I answered all of their questions. I certainly can empathize with him about not wanting to do a transplant because of the potential complications. He does have some   [name], MD 8/14/20156:08 PM

## 2014-07-27 ENCOUNTER — Ambulatory Visit (INDEPENDENT_AMBULATORY_CARE_PROVIDER_SITE_OTHER): Payer: Self-pay | Admitting: Thoracic Surgery (Cardiothoracic Vascular Surgery)

## 2014-07-27 ENCOUNTER — Encounter: Payer: Self-pay | Admitting: Thoracic Surgery (Cardiothoracic Vascular Surgery)

## 2014-07-27 VITALS — BP 119/84 | HR 80 | Resp 16 | Ht 70.0 in | Wt 173.0 lb

## 2014-07-27 DIAGNOSIS — Z09 Encounter for follow-up examination after completed treatment for conditions other than malignant neoplasm: Secondary | ICD-10-CM

## 2014-07-27 DIAGNOSIS — C384 Malignant neoplasm of pleura: Secondary | ICD-10-CM

## 2014-07-27 DIAGNOSIS — C8190 Hodgkin lymphoma, unspecified, unspecified site: Secondary | ICD-10-CM

## 2014-07-27 NOTE — Progress Notes (Signed)
HPI:  Mr. Banks returns today for a scheduled postop follow up visit  He is a 53 year old gentleman who underwent a left VATS and resection of a pleural mass on July 7. The mass turned out to be Hodgkin's lymphoma. He has started treatment.  He was last seen in the office in late July. At that time he was having neuropathic pain related to his surgery. He says that since then he has continued to have the same discomfort. He says it's usually worse with light touch. It is usually a 3-4 on a scale of 1-10. He still feels like there is a lump there. Overall he says is more of an irritation than anything else.  Past Medical History  Diagnosis Date  . Wears contact lenses   . H/O autologous stem cell transplant     may 2014  at [facility]  . History of peptic ulcer   . Mass of right inguinal region   . History of Bell's palsy     2009  RIGHT SIDE--  HAS 80% FUNCTION / PT STATES A LITTLE ASYMETRICAL AND EFFECTS MOUTH  . Hodgkin's disease, nodular sclerosis, of inguinal region/lower limb ONOLOGIST--  DR [facility] AND A [facility]      [facility] CHEMO 2013/  AUTOLOGUS STEM CELL TRANSPLANT MAY 2014 AT [facility]  . Dysrhythmia     past hx pvc  . Anxiety   . PVC (premature ventricular contraction)     "benign"      Current Outpatient Prescriptions  Medication Sig Dispense Refill  . acetaminophen (TYLENOL) 325 MG tablet Take 325 mg by mouth every 6 (six) hours as needed for mild pain.      Marland Kitchen ALPRAZolam (XANAX) 0.5 MG tablet Take 0.5 mg by mouth daily as needed for anxiety or sleep.      . Cholecalciferol (VITAMIN D) 1000 UNITS capsule Take 1,000 Units by mouth 2 (two) times daily.       . citalopram (CELEXA) 20 MG tablet Take 1 tablet (20 mg total) by mouth daily.  30 tablet  3  . dexamethasone (DECADRON) 4 MG tablet Take 2 tablets (8 mg total) by mouth 2 (two) times daily with a meal. Start the day after chemotherapy for 2 days. Take with food.  30 tablet  1  . ibuprofen (ADVIL,MOTRIN) 200 MG tablet Take 200-400  mg by mouth every 6 (six) hours as needed for mild pain.       Marland Kitchen lidocaine (LIDODERM) 5 % Place 1 patch onto the skin daily. Remove & Discard patch within 12 hours or as directed by MD  30 patch  3  . lidocaine-prilocaine (EMLA) cream Apply 1 application topically as needed. Apply nickel sized amount to portacath site at least 1 hour prior to chemotherapy.  30 g  1  . LORazepam (ATIVAN) 0.5 MG tablet Take 1 tablet (0.5 mg total) by mouth every 6 (six) hours as needed (Nausea or vomiting).  30 tablet  0  . LORazepam (ATIVAN) 1 MG tablet Take 0.5-1 mg by mouth 2 (two) times daily as needed for anxiety or sleep.      . Multiple Vitamin (MULTIVITAMIN) tablet Take 1 tablet by mouth daily.      . ondansetron (ZOFRAN) 8 MG tablet Take 1 tablet (8 mg total) by mouth 2 (two) times daily. Start the day after chemo for 2 days. Then take as needed for nausea or vomiting.  30 tablet  1  . prochlorperazine (COMPAZINE) 10 MG tablet Take 1 tablet (10  mg total) by mouth every 6 (six) hours as needed (Nausea or vomiting).  30 tablet  1   No current facility-administered medications for this visit.   Facility-Administered Medications Ordered in Other Visits  Medication Dose Route Frequency Provider Last Rate Last Dose  . sodium chloride 0.9 % injection 10 mL  10 mL Intracatheter PRN [name], MD   10 mL at 06/17/14 1355    Physical Exam BP 119/84  Pulse 80  Resp 16  Ht 5\' 10"  (1.778 m)  Wt 173 lb (78.472 kg)  BMI 24.82 kg/m2  SpO56 59% 53 year old male in no acute distress Incision well-healed Laxity of upper rectus muscles on the left side  Diagnostic Tests: none  Impression: Mr. Hofland is a 53 year old gentleman who had a left vats and excisional biopsy of a pleural based mass in early July. The mass turned out to be recurrent Hodgkin's disease, and he has started treatment for that. Since the surgery he's had persistent neuropathic pain in the left flank/left upper quadrant area. This does wax  and wane to some degree and he thinks it's a little better than it was a month ago, but it remains bothersome to him. We once again discussed the option of trying an alternative medication such as gabapentin or Lyrica, but he does not want to do that at this time. He does know that is an option if his discomfort were to get worse.   Plan: I will be happy to see Mr. Crehan back if he continues to have difficulty.  If he wishes to try gabapentin he will call the office

## 2014-07-29 ENCOUNTER — Other Ambulatory Visit (HOSPITAL_BASED_OUTPATIENT_CLINIC_OR_DEPARTMENT_OTHER): Payer: Commercial Managed Care - PPO | Admitting: Lab

## 2014-07-29 ENCOUNTER — Ambulatory Visit (HOSPITAL_BASED_OUTPATIENT_CLINIC_OR_DEPARTMENT_OTHER): Payer: Commercial Managed Care - PPO

## 2014-07-29 ENCOUNTER — Ambulatory Visit (HOSPITAL_BASED_OUTPATIENT_CLINIC_OR_DEPARTMENT_OTHER): Payer: Commercial Managed Care - PPO | Admitting: Hematology & Oncology

## 2014-07-29 ENCOUNTER — Encounter: Payer: Self-pay | Admitting: Hematology & Oncology

## 2014-07-29 VITALS — BP 122/81 | HR 74 | Temp 98.4°F | Resp 18 | Ht 70.0 in | Wt 173.0 lb

## 2014-07-29 DIAGNOSIS — C8119 Nodular sclerosis classical Hodgkin lymphoma, extranodal and solid organ sites: Secondary | ICD-10-CM

## 2014-07-29 DIAGNOSIS — C8195 Hodgkin lymphoma, unspecified, lymph nodes of inguinal region and lower limb: Secondary | ICD-10-CM

## 2014-07-29 DIAGNOSIS — E349 Endocrine disorder, unspecified: Secondary | ICD-10-CM

## 2014-07-29 DIAGNOSIS — R634 Abnormal weight loss: Secondary | ICD-10-CM

## 2014-07-29 DIAGNOSIS — E291 Testicular hypofunction: Secondary | ICD-10-CM

## 2014-07-29 DIAGNOSIS — Z5112 Encounter for antineoplastic immunotherapy: Secondary | ICD-10-CM

## 2014-07-29 DIAGNOSIS — Z9484 Stem cells transplant status: Secondary | ICD-10-CM

## 2014-07-29 LAB — CMP (CANCER CENTER ONLY)
ALT(SGPT): 25 U/L (ref 10–47)
AST: 21 U/L (ref 11–38)
Albumin: 3.7 g/dL (ref 3.3–5.5)
Alkaline Phosphatase: 77 U/L (ref 26–84)
BILIRUBIN TOTAL: 0.7 mg/dL (ref 0.20–1.60)
BUN: 14 mg/dL (ref 7–22)
CO2: 28 meq/L (ref 18–33)
Calcium: 9.2 mg/dL (ref 8.0–10.3)
Chloride: 103 mEq/L (ref 98–108)
Creat: 0.7 mg/dl (ref 0.6–1.2)
GLUCOSE: 68 mg/dL — AB (ref 73–118)
Potassium: 4.3 mEq/L (ref 3.3–4.7)
Sodium: 142 mEq/L (ref 128–145)
TOTAL PROTEIN: 7.2 g/dL (ref 6.4–8.1)

## 2014-07-29 LAB — CBC WITH DIFFERENTIAL (CANCER CENTER ONLY)
BASO#: 0.1 10*3/uL (ref 0.0–0.2)
BASO%: 1.3 % (ref 0.0–2.0)
EOS ABS: 0.1 10*3/uL (ref 0.0–0.5)
EOS%: 2.6 % (ref 0.0–7.0)
HEMATOCRIT: 39.6 % (ref 38.7–49.9)
HEMOGLOBIN: 13.6 g/dL (ref 13.0–17.1)
LYMPH#: 0.7 10*3/uL — ABNORMAL LOW (ref 0.9–3.3)
LYMPH%: 19 % (ref 14.0–48.0)
MCH: 33.2 pg (ref 28.0–33.4)
MCHC: 34.3 g/dL (ref 32.0–35.9)
MCV: 97 fL (ref 82–98)
MONO#: 0.9 10*3/uL (ref 0.1–0.9)
MONO%: 22.9 % — AB (ref 0.0–13.0)
NEUT%: 54.2 % (ref 40.0–80.0)
NEUTROS ABS: 2.1 10*3/uL (ref 1.5–6.5)
Platelets: 215 10*3/uL (ref 145–400)
RBC: 4.1 10*6/uL — AB (ref 4.20–5.70)
RDW: 12.6 % (ref 11.1–15.7)
WBC: 3.9 10*3/uL — ABNORMAL LOW (ref 4.0–10.0)

## 2014-07-29 LAB — LACTATE DEHYDROGENASE: LDH: 140 U/L (ref 94–250)

## 2014-07-29 MED ORDER — SODIUM CHLORIDE 0.9 % IV SOLN
Freq: Once | INTRAVENOUS | Status: AC
  Administered 2014-07-29: 11:00:00 via INTRAVENOUS

## 2014-07-29 MED ORDER — DEXAMETHASONE SODIUM PHOSPHATE 10 MG/ML IJ SOLN
10.0000 mg | Freq: Once | INTRAMUSCULAR | Status: AC
  Administered 2014-07-29: 10 mg via INTRAVENOUS

## 2014-07-29 MED ORDER — HEPARIN SOD (PORK) LOCK FLUSH 100 UNIT/ML IV SOLN
500.0000 [IU] | Freq: Once | INTRAVENOUS | Status: AC | PRN
  Administered 2014-07-29: 500 [IU]
  Filled 2014-07-29: qty 5

## 2014-07-29 MED ORDER — ONDANSETRON 8 MG/50ML IVPB (CHCC)
8.0000 mg | Freq: Once | INTRAVENOUS | Status: AC
  Administered 2014-07-29: 8 mg via INTRAVENOUS

## 2014-07-29 MED ORDER — ACETAMINOPHEN 325 MG PO TABS
ORAL_TABLET | ORAL | Status: AC
  Filled 2014-07-29: qty 2

## 2014-07-29 MED ORDER — DIPHENHYDRAMINE HCL 50 MG/ML IJ SOLN
50.0000 mg | Freq: Once | INTRAMUSCULAR | Status: AC
  Administered 2014-07-29: 50 mg via INTRAVENOUS

## 2014-07-29 MED ORDER — ACETAMINOPHEN 325 MG PO TABS
650.0000 mg | ORAL_TABLET | Freq: Once | ORAL | Status: AC
  Administered 2014-07-29: 650 mg via ORAL

## 2014-07-29 MED ORDER — DEXAMETHASONE SODIUM PHOSPHATE 20 MG/5ML IJ SOLN
INTRAMUSCULAR | Status: AC
  Filled 2014-07-29: qty 5

## 2014-07-29 MED ORDER — TESTOSTERONE CYPIONATE 200 MG/ML IM SOLN
INTRAMUSCULAR | Status: AC
  Filled 2014-07-29: qty 2

## 2014-07-29 MED ORDER — TESTOSTERONE CYPIONATE 200 MG/ML IM SOLN
400.0000 mg | INTRAMUSCULAR | Status: DC
  Administered 2014-07-29: 400 mg via INTRAMUSCULAR

## 2014-07-29 MED ORDER — DIPHENHYDRAMINE HCL 50 MG/ML IJ SOLN
INTRAMUSCULAR | Status: AC
  Filled 2014-07-29: qty 1

## 2014-07-29 MED ORDER — SODIUM CHLORIDE 0.9 % IV SOLN
1.8000 mg/kg | Freq: Once | INTRAVENOUS | Status: AC
  Administered 2014-07-29: 145 mg via INTRAVENOUS
  Filled 2014-07-29: qty 29

## 2014-07-29 MED ORDER — SODIUM CHLORIDE 0.9 % IJ SOLN
10.0000 mL | INTRAMUSCULAR | Status: DC | PRN
  Administered 2014-07-29: 10 mL
  Filled 2014-07-29: qty 10

## 2014-07-29 NOTE — Patient Instructions (Signed)
[facility] Discharge Instructions for Patients Receiving Chemotherapy  Today you received the following chemotherapy agents Adcetris  To help prevent nausea and vomiting after your treatment, we encourage you to take your nausea medications as directed.   If you develop nausea and vomiting that is not controlled by your nausea medication, call the clinic.   BELOW ARE SYMPTOMS THAT SHOULD BE REPORTED IMMEDIATELY:  *FEVER GREATER THAN 100.5 F  *CHILLS WITH OR WITHOUT FEVER  NAUSEA AND VOMITING THAT IS NOT CONTROLLED WITH YOUR NAUSEA MEDICATION  *UNUSUAL SHORTNESS OF BREATH  *UNUSUAL BRUISING OR BLEEDING  TENDERNESS IN MOUTH AND THROAT WITH OR WITHOUT PRESENCE OF ULCERS  *URINARY PROBLEMS  *BOWEL PROBLEMS  UNUSUAL RASH Items with * indicate a potential emergency and should be followed up as soon as possible.   Feel free to call the clinic you have any questions or concerns. The clinic phone number is [phone].   Brentuximab vedotin solution for injection What is this medicine? BRENTUXIMAB VEDOTIN (bren TUX see mab ve DOE tin) is a chemotherapy drug. It targets a specific protein within cancer cells and stops the cancer cells from growing. This medicine is used for treating Hodgkin's lymphoma and certain non-Hodgkin's lymphomas, such as systemic anaplastic large cell lymphoma. This medicine may be used for other purposes; ask your health care provider or pharmacist if you have questions. COMMON BRAND NAME(S): ADCETRIS What should I tell my health care provider before I take this medicine? They need to know if you have any of these conditions: -immune system problems -infection (especially a virus infection such as chickenpox, cold sores, or herpes) -kidney disease -liver disease -low blood counts, like low white cell, platelet, or red cell counts -tingling of the fingers or toes, or other nerve disorder -an unusual or allergic reaction to brentuximab  vedotin, other medicines, foods, dyes, or preservatives -pregnant or trying to get pregnant -breast-feeding How should I use this medicine? This medicine is for infusion into a vein. It is given by a health care professional in a hospital or clinic setting. Talk to your pediatrician regarding the use of this medicine in children. Special care may be needed. Overdosage: If you think you've taken too much of this medicine contact a poison control center or emergency room at once. Overdosage: If you think you have taken too much of this medicine contact a poison control center or emergency room at once. NOTE: This medicine is only for you. Do not share this medicine with others. What if I miss a dose? It is important not to miss your dose. Call your doctor or health care professional if you are unable to keep an appointment. What may interact with this medicine? This medicine may interact with the following medications: -ketoconazole -rifampin -St. John's wort; Hypericum perforatum This list may not describe all possible interactions. Give your health care provider a list of all the medicines, herbs, non-prescription drugs, or dietary supplements you use. Also tell them if you smoke, drink alcohol, or use illegal drugs. Some items may interact with your medicine. What should I watch for while using this medicine? Visit your doctor for checks on your progress. This drug may make you feel generally unwell. This is not uncommon, as chemotherapy can affect healthy cells as well as cancer cells. Report any side effects. Continue your course of treatment even though you feel ill unless your doctor tells you to stop. Call your doctor or health care professional for advice if you get a  fever, chills or sore throat, or other symptoms of a cold or flu. Do not treat yourself. This drug decreases your body's ability to fight infections. Try to avoid being around people who are sick. This medicine may increase  your risk to bruise or bleed. Call your doctor or health care professional if you notice any unusual bleeding. In some patients, this medicine may cause a serious brain infection that may cause death. If you have any problems seeing, thinking, speaking, walking, or standing, tell your doctor right away. If you cannot reach your doctor, urgently seek other source of medical care. Do not become pregnant while taking this medicine. Women should inform their doctor if they wish to become pregnant or think they might be pregnant. There is a potential for serious side effects to an unborn child. Talk to your health care professional or pharmacist for more information. Do not breast-feed an infant while taking this medicine. What side effects may I notice from receiving this medicine? Side effects that you should report to your doctor or health care professional as soon as possible: -allergic reactions like skin rash, itching or hives, swelling of the face, lips, or tongue -changes in emotions or moods -low blood counts - this medicine may decrease the number of white blood cells, red blood cells and platelets. You may be at increased risk for infections and bleeding. -pain, tingling, numbness in the hands or feet -redness, blistering, peeling or loosening of the skin, including inside the mouth -shortness of breath -signs of infection - fever or chills, cough, sore throat, pain or difficulty passing urine -signs of decreased platelets or bleeding - bruising, pinpoint red spots on the skin, black, tarry stools, blood in the urine -signs of decreased red blood cells - unusually weak or tired, fainting spells, lightheadedness -signs of liver injury like dark yellow or brown urine; general ill feeling or flu-like symptoms; light-colored stools; loss of appetite; nausea; right upper belly pain; yellowing of the eyes or skin -stomach pain -sudden numbness or weakness of the face, arm or leg Side effects that  usually do not require medical attention (Report these to your doctor or health care professional if they continue or are bothersome): -diarrhea -dizziness -headache -muscle pain -nausea, vomiting -tiredness This list may not describe all possible side effects. Call your doctor for medical advice about side effects. You may report side effects to FDA at 1-800-FDA-1088. Where should I keep my medicine? This drug is given in a hospital or clinic and will not be stored at home. NOTE: This sheet is a summary. It may not cover all possible information. If you have questions about this medicine, talk to your doctor, pharmacist, or health care provider.  2015, [employer]. (2014-02-24 16:52:15)

## 2014-07-30 ENCOUNTER — Telehealth: Payer: Self-pay

## 2014-07-30 NOTE — Telephone Encounter (Signed)
Patient has several questions regarding the flu vaccine. Please call patient at [phone]

## 2014-07-30 NOTE — Progress Notes (Signed)
Hematology and Oncology Follow Up Visit  Brian [name] [id number] [birth year] 53 y.o. 07/30/2014   Principle Diagnosis:   Recurrent Hodgkin's disease-post autologous transplant   Current Therapy:   Status post 2 cycle of Adcetris       Interim History:  [name] is back for followup. He is on pre-well. So far, he is tolerating treatment nicely. He does not have any conrod neuropathy. He's had no fever. He's had no weight loss. He's exercising. He's had no cough. There's been no change in bowel or bladder habits. He's had no rashes. There's been no leg swelling.  Overall, his performance status is ECOG 0  Medications: Current outpatient prescriptions:acetaminophen (TYLENOL) 325 MG tablet, Take 325 mg by mouth every 6 (six) hours as needed for mild pain., Disp: , Rfl: ;  ALPRAZolam (XANAX) 0.5 MG tablet, Take 0.5 mg by mouth daily as needed for anxiety or sleep., Disp: , Rfl: ;  Cholecalciferol (VITAMIN D-3) 1000 UNITS CAPS, Take by mouth 2 (two) times daily., Disp: , Rfl:  citalopram (CELEXA) 20 MG tablet, Take 1 tablet (20 mg total) by mouth daily., Disp: 30 tablet, Rfl: 3;  dexamethasone (DECADRON) 4 MG tablet, Take 2 tablets (8 mg total) by mouth 2 (two) times daily with a meal. Start the day after chemotherapy for 2 days. Take with food., Disp: 30 tablet, Rfl: 1;  ibuprofen (ADVIL,MOTRIN) 200 MG tablet, Take 200-400 mg by mouth every 6 (six) hours as needed for mild pain. , Disp: , Rfl:  lidocaine (LIDODERM) 5 %, Place 1 patch onto the skin daily. Remove & Discard patch within 12 hours or as directed by MD, Disp: 30 patch, Rfl: 3;  lidocaine-prilocaine (EMLA) cream, Apply 1 application topically as needed. Apply nickel sized amount to portacath site at least 1 hour prior to chemotherapy., Disp: 30 g, Rfl: 1;  LORazepam (ATIVAN) 1 MG tablet, Take 0.5-1 mg by mouth 2 (two) times daily as needed for anxiety or sleep., Disp: , Rfl:  Multiple Vitamin (MULTIVITAMIN) tablet, Take 1 tablet by mouth  daily., Disp: , Rfl: ;  ondansetron (ZOFRAN) 8 MG tablet, Take 1 tablet (8 mg total) by mouth 2 (two) times daily. Start the day after chemo for 2 days. Then take as needed for nausea or vomiting., Disp: 30 tablet, Rfl: 1;  prochlorperazine (COMPAZINE) 10 MG tablet, Take 1 tablet (10 mg total) by mouth every 6 (six) hours as needed (Nausea or vomiting)., Disp: 30 tablet, Rfl: 1 LORazepam (ATIVAN) 0.5 MG tablet, Take 1 tablet (0.5 mg total) by mouth every 6 (six) hours as needed (Nausea or vomiting)., Disp: 30 tablet, Rfl: 0 No current facility-administered medications for this visit. Facility-Administered Medications Ordered in Other Visits: sodium chloride 0.9 % injection 10 mL, 10 mL, Intracatheter, PRN, [name], MD, 10 mL at 06/17/14 1355  Allergies: No Known Allergies  Past Medical History, Surgical history, Social history, and Family History were reviewed and updated.  Review of Systems: As above  Physical Exam:  height is 5\' 10"  (1.778 m) and weight is 173 lb (78.472 kg). His oral temperature is 98.4 F (36.9 C). His blood pressure is 122/81 and his pulse is 74. His respiration is 18.   Well -developed and well-nourished white male. Head and neck exam shows no ocular or oral lesions. He has no palpable cervical or supraclavicular lymph nodes. Lungs are clear. Cardiac exam regular heirloom. Abdomen soft. Has good bowel sounds. There is no fluid wave. There is no palpable liver  or spleen tip. Back exam shows a thoracoscopy scar in the left lateral chest wall. There is a well-healed. Extremities shows no clubbing cyanosis or edema. Skin exam no rashes. Neurological exam is nonfocal.  Lab Results  Component Value Date   WBC 3.9* 07/29/2014   HGB 13.6 07/29/2014   HCT 39.6 07/29/2014   MCV 97 07/29/2014   PLT 215 07/29/2014     Chemistry      Component Value Date/Time   NA 142 07/29/2014 0928   NA 141 06/02/2014 0335   NA 142 03/25/2013 0828   K 4.3 07/29/2014 0928   K 4.0 06/02/2014 0335   K  4.1 03/25/2013 0828   CL 103 07/29/2014 0928   CL 101 06/02/2014 0335   CL 107 03/25/2013 0828   CO2 28 07/29/2014 0928   CO2 27 06/02/2014 0335   CO2 27 03/25/2013 0828   BUN 14 07/29/2014 0928   BUN 16 06/02/2014 0335   BUN 12.7 03/25/2013 0828   CREATININE 0.7 07/29/2014 0928   CREATININE 1.00 06/02/2014 0335   CREATININE 0.9 03/25/2013 0828      Component Value Date/Time   CALCIUM 9.2 07/29/2014 0928   CALCIUM 9.3 06/02/2014 0335   CALCIUM 9.2 03/25/2013 0828   ALKPHOS 77 07/29/2014 0928   ALKPHOS 71 06/02/2014 0335   ALKPHOS 122 03/25/2013 0828   AST 21 07/29/2014 0928   AST 24 06/02/2014 0335   AST 33 03/25/2013 0828   ALT 25 07/29/2014 0928   ALT 20 06/02/2014 0335   ALT 65* 03/25/2013 0828   BILITOT 0.70 07/29/2014 0928   BILITOT 0.5 06/02/2014 0335   BILITOT 0.31 03/25/2013 0828         Impression and Plan: Brian [name] is a 53 year old gentleman with recurrent Hodgkin's lymphoma. This has recurred after his bone marrow transplant. He had an autologous transplant.  I would have to think that he is going to respond to treatment.  We will go ahead and plan for his third cycle of treatment. His blood counts are okay.  I will then plan for a fourth cycle of treatment and that we will rescan him with a PET scan.     [name], MD 9/4/20155:39 PM

## 2014-07-30 NOTE — Telephone Encounter (Signed)
Pt is immunocompromised. Pt is in need of a flu shot and is concerned about the waiting room and the vaccine. Assured pt the vaccine is not the live virus and asked pt to call when he is ready to come in for the shot- he can be seen at 104 or we can make arraignments for him to come in the back door to avoid the other illnesses.

## 2014-07-30 NOTE — Telephone Encounter (Signed)
Patient has several questions regarding the flu vaccine. Patient trying to determine if he should get it or not. Please call patient at [phone]

## 2014-08-03 ENCOUNTER — Encounter: Payer: Commercial Managed Care - PPO | Admitting: Emergency Medicine

## 2014-08-13 ENCOUNTER — Telehealth: Payer: Self-pay

## 2014-08-13 NOTE — Telephone Encounter (Signed)
Patient called to let us know he was coming in this weekend because he has a compromised immune system - he is currently going through chemo. Please fast track for flu shot as usual, if there is a wait for fast track please expedite or offer pager. He can not stay waiting in lobby. - per Brian [name].

## 2014-08-13 NOTE — Telephone Encounter (Signed)
Noted. Printed message to make sure pt is fast tracked this weekend.

## 2014-08-19 ENCOUNTER — Telehealth: Payer: Self-pay | Admitting: Hematology & Oncology

## 2014-08-19 ENCOUNTER — Ambulatory Visit (HOSPITAL_BASED_OUTPATIENT_CLINIC_OR_DEPARTMENT_OTHER): Payer: Commercial Managed Care - PPO

## 2014-08-19 ENCOUNTER — Encounter: Payer: Self-pay | Admitting: Hematology & Oncology

## 2014-08-19 ENCOUNTER — Ambulatory Visit (HOSPITAL_BASED_OUTPATIENT_CLINIC_OR_DEPARTMENT_OTHER): Payer: Commercial Managed Care - PPO | Admitting: Hematology & Oncology

## 2014-08-19 ENCOUNTER — Other Ambulatory Visit (HOSPITAL_BASED_OUTPATIENT_CLINIC_OR_DEPARTMENT_OTHER): Payer: Commercial Managed Care - PPO | Admitting: Lab

## 2014-08-19 VITALS — BP 125/83 | HR 78 | Temp 98.8°F | Resp 18 | Ht 70.0 in | Wt 175.0 lb

## 2014-08-19 DIAGNOSIS — C8195 Hodgkin lymphoma, unspecified, lymph nodes of inguinal region and lower limb: Secondary | ICD-10-CM

## 2014-08-19 DIAGNOSIS — C8190 Hodgkin lymphoma, unspecified, unspecified site: Secondary | ICD-10-CM

## 2014-08-19 DIAGNOSIS — Z5112 Encounter for antineoplastic immunotherapy: Secondary | ICD-10-CM

## 2014-08-19 DIAGNOSIS — E349 Endocrine disorder, unspecified: Secondary | ICD-10-CM

## 2014-08-19 DIAGNOSIS — Z23 Encounter for immunization: Secondary | ICD-10-CM

## 2014-08-19 LAB — CMP (CANCER CENTER ONLY)
ALBUMIN: 3.8 g/dL (ref 3.3–5.5)
ALT: 28 U/L (ref 10–47)
AST: 26 U/L (ref 11–38)
Alkaline Phosphatase: 57 U/L (ref 26–84)
BUN, Bld: 22 mg/dL (ref 7–22)
CALCIUM: 9.2 mg/dL (ref 8.0–10.3)
CHLORIDE: 102 meq/L (ref 98–108)
CO2: 28 meq/L (ref 18–33)
Creat: 1.1 mg/dl (ref 0.6–1.2)
Glucose, Bld: 84 mg/dL (ref 73–118)
POTASSIUM: 4.4 meq/L (ref 3.3–4.7)
SODIUM: 143 meq/L (ref 128–145)
TOTAL PROTEIN: 7.1 g/dL (ref 6.4–8.1)
Total Bilirubin: 1 mg/dl (ref 0.20–1.60)

## 2014-08-19 LAB — LACTATE DEHYDROGENASE: LDH: 145 U/L (ref 94–250)

## 2014-08-19 LAB — CBC WITH DIFFERENTIAL (CANCER CENTER ONLY)
BASO#: 0.1 10*3/uL (ref 0.0–0.2)
BASO%: 1.5 % (ref 0.0–2.0)
EOS%: 4.2 % (ref 0.0–7.0)
Eosinophils Absolute: 0.1 10*3/uL (ref 0.0–0.5)
HCT: 41.4 % (ref 38.7–49.9)
HGB: 14.1 g/dL (ref 13.0–17.1)
LYMPH#: 0.6 10*3/uL — AB (ref 0.9–3.3)
LYMPH%: 18.1 % (ref 14.0–48.0)
MCH: 32.8 pg (ref 28.0–33.4)
MCHC: 34.1 g/dL (ref 32.0–35.9)
MCV: 96 fL (ref 82–98)
MONO#: 0.6 10*3/uL (ref 0.1–0.9)
MONO%: 16.9 % — ABNORMAL HIGH (ref 0.0–13.0)
NEUT#: 2 10*3/uL (ref 1.5–6.5)
NEUT%: 59.3 % (ref 40.0–80.0)
PLATELETS: 235 10*3/uL (ref 145–400)
RBC: 4.3 10*6/uL (ref 4.20–5.70)
RDW: 13.4 % (ref 11.1–15.7)
WBC: 3.3 10*3/uL — AB (ref 4.0–10.0)

## 2014-08-19 LAB — TESTOSTERONE: TESTOSTERONE: 184 ng/dL — AB (ref 300–890)

## 2014-08-19 MED ORDER — SODIUM CHLORIDE 0.9 % IJ SOLN
10.0000 mL | INTRAMUSCULAR | Status: DC | PRN
  Administered 2014-08-19: 10 mL
  Filled 2014-08-19: qty 10

## 2014-08-19 MED ORDER — SODIUM CHLORIDE 0.9 % IV SOLN
1.8000 mg/kg | Freq: Once | INTRAVENOUS | Status: AC
  Administered 2014-08-19: 145 mg via INTRAVENOUS
  Filled 2014-08-19: qty 29

## 2014-08-19 MED ORDER — DIPHENHYDRAMINE HCL 50 MG/ML IJ SOLN
50.0000 mg | Freq: Once | INTRAMUSCULAR | Status: AC
  Administered 2014-08-19: 50 mg via INTRAVENOUS

## 2014-08-19 MED ORDER — INFLUENZA VAC SPLIT QUAD 0.5 ML IM SUSY
0.5000 mL | PREFILLED_SYRINGE | Freq: Once | INTRAMUSCULAR | Status: AC
  Administered 2014-08-19: 0.5 mL via INTRAMUSCULAR
  Filled 2014-08-19: qty 0.5

## 2014-08-19 MED ORDER — SODIUM CHLORIDE 0.9 % IV SOLN
Freq: Once | INTRAVENOUS | Status: AC
  Administered 2014-08-19: 14:00:00 via INTRAVENOUS

## 2014-08-19 MED ORDER — HEPARIN SOD (PORK) LOCK FLUSH 100 UNIT/ML IV SOLN
500.0000 [IU] | Freq: Once | INTRAVENOUS | Status: AC | PRN
  Administered 2014-08-19: 500 [IU]
  Filled 2014-08-19: qty 5

## 2014-08-19 MED ORDER — DEXAMETHASONE SODIUM PHOSPHATE 20 MG/5ML IJ SOLN
INTRAMUSCULAR | Status: AC
  Filled 2014-08-19: qty 5

## 2014-08-19 MED ORDER — ACETAMINOPHEN 325 MG PO TABS
ORAL_TABLET | ORAL | Status: AC
  Filled 2014-08-19: qty 2

## 2014-08-19 MED ORDER — DEXAMETHASONE SODIUM PHOSPHATE 10 MG/ML IJ SOLN
10.0000 mg | Freq: Once | INTRAMUSCULAR | Status: AC
  Administered 2014-08-19: 10 mg via INTRAVENOUS

## 2014-08-19 MED ORDER — DIPHENHYDRAMINE HCL 25 MG PO CAPS
ORAL_CAPSULE | ORAL | Status: AC
  Filled 2014-08-19: qty 1

## 2014-08-19 MED ORDER — ACETAMINOPHEN 325 MG PO TABS
650.0000 mg | ORAL_TABLET | Freq: Once | ORAL | Status: AC
  Administered 2014-08-19: 650 mg via ORAL

## 2014-08-19 MED ORDER — ONDANSETRON 8 MG/50ML IVPB (CHCC)
8.0000 mg | Freq: Once | INTRAVENOUS | Status: AC
  Administered 2014-08-19: 8 mg via INTRAVENOUS

## 2014-08-19 NOTE — Patient Instructions (Signed)
Brentuximab vedotin solution for injection What is this medicine? BRENTUXIMAB VEDOTIN (bren TUX see mab ve DOE tin) is a chemotherapy drug. It targets a specific protein within cancer cells and stops the cancer cells from growing. This medicine is used for treating Hodgkin's lymphoma and certain non-Hodgkin's lymphomas, such as systemic anaplastic large cell lymphoma. This medicine may be used for other purposes; ask your health care Brian [name] or pharmacist if you have questions. COMMON BRAND NAME(S): ADCETRIS What should I tell my health care Brian [name] before I take this medicine? They need to know if you have any of these conditions: -immune system problems -infection (especially a virus infection such as chickenpox, cold sores, or herpes) -kidney disease -liver disease -low blood counts, like low white cell, platelet, or red cell counts -tingling of the fingers or toes, or other nerve disorder -an unusual or allergic reaction to brentuximab vedotin, other medicines, foods, dyes, or preservatives -pregnant or trying to get pregnant -breast-feeding How should I use this medicine? This medicine is for infusion into a vein. It is given by a health care professional in a hospital or clinic setting. Talk to your pediatrician regarding the use of this medicine in children. Special care may be needed. Overdosage: If you think you've taken too much of this medicine contact a poison control center or emergency room at once. Overdosage: If you think you have taken too much of this medicine contact a poison control center or emergency room at once. NOTE: This medicine is only for you. Do not share this medicine with others. What if I miss a dose? It is important not to miss your dose. Call your doctor or health care professional if you are unable to keep an appointment. What may interact with this medicine? This medicine may interact with the following medications: -ketoconazole -rifampin -St.  John's wort; Hypericum perforatum This list may not describe all possible interactions. Give your health care Brian [name] a list of all the medicines, herbs, non-prescription drugs, or dietary supplements you use. Also tell them if you smoke, drink alcohol, or use illegal drugs. Some items may interact with your medicine. What should I watch for while using this medicine? Visit your doctor for checks on your progress. This drug may make you feel generally unwell. This is not uncommon, as chemotherapy can affect healthy cells as well as cancer cells. Report any side effects. Continue your course of treatment even though you feel ill unless your doctor tells you to stop. Call your doctor or health care professional for advice if you get a fever, chills or sore throat, or other symptoms of a cold or flu. Do not treat yourself. This drug decreases your body's ability to fight infections. Try to avoid being around people who are sick. This medicine may increase your risk to bruise or bleed. Call your doctor or health care professional if you notice any unusual bleeding. In some patients, this medicine may cause a serious brain infection that may cause death. If you have any problems seeing, thinking, speaking, walking, or standing, tell your doctor right away. If you cannot reach your doctor, urgently seek other source of medical care. Do not become pregnant while taking this medicine. Women should inform their doctor if they wish to become pregnant or think they might be pregnant. There is a potential for serious side effects to an unborn child. Talk to your health care professional or pharmacist for more information. Do not breast-feed an infant while taking this medicine. What side effects   may I notice from receiving this medicine? Side effects that you should report to your doctor or health care professional as soon as possible: -allergic reactions like skin rash, itching or hives, swelling of the face, lips,  or tongue -changes in emotions or moods -low blood counts - this medicine may decrease the number of white blood cells, red blood cells and platelets. You may be at increased risk for infections and bleeding. -pain, tingling, numbness in the hands or feet -redness, blistering, peeling or loosening of the skin, including inside the mouth -shortness of breath -signs of infection - fever or chills, cough, sore throat, pain or difficulty passing urine -signs of decreased platelets or bleeding - bruising, pinpoint red spots on the skin, black, tarry stools, blood in the urine -signs of decreased red blood cells - unusually weak or tired, fainting spells, lightheadedness -signs of liver injury like dark yellow or brown urine; general ill feeling or flu-like symptoms; light-colored stools; loss of appetite; nausea; right upper belly pain; yellowing of the eyes or skin -stomach pain -sudden numbness or weakness of the face, arm or leg Side effects that usually do not require medical attention (Report these to your doctor or health care professional if they continue or are bothersome): -diarrhea -dizziness -headache -muscle pain -nausea, vomiting -tiredness This list may not describe all possible side effects. Call your doctor for medical advice about side effects. You may report side effects to FDA at 1-800-FDA-1088. Where should I keep my medicine? This drug is given in a hospital or clinic and will not be stored at home. NOTE: This sheet is a summary. It may not cover all possible information. If you have questions about this medicine, talk to your doctor, pharmacist, or health care Brian [name].  2015, [employer]. (2014-02-24 16:52:15)  

## 2014-08-19 NOTE — Telephone Encounter (Signed)
[facility]  Fx: [phone]  Ph: [phone] ext [phone]  PATIENT: Brian [name]  AUTH: [id number]  VALID: 08/25/2014 - 10/25/2014  CHEMO: [id number] Adcetris 135mg  chemo infusion

## 2014-08-19 NOTE — Progress Notes (Signed)
Hematology and Oncology Follow Up Visit  Brian [name] [id number] Mar 15, 1961 53 y.o. 08/19/2014   Principle Diagnosis:  Recurrent Hodgkin's Disease -  S/p autologous transplant   Current Therapy:    S/p Adcetris x 3 cycles     Interim History:  [name] is in for followup. Is doing very well. He feels good. He still has a little bit of numbness where he had in the thoracoscopy. This, however, is doing better.  He's had no cough. He's had no increasing fatigue. He is exercising a little more. He has had no change in bowel or bladder habits. He's had no rashes. He's had a swollen. He's had no fever.  Overall, his performance status is ECOG 1.  He does not want to go through and an allogeneic transplant.   Medications: Current outpatient prescriptions:acetaminophen (TYLENOL) 325 MG tablet, Take 325 mg by mouth every 6 (six) hours as needed for mild pain., Disp: , Rfl: ;  ALPRAZolam (XANAX) 0.5 MG tablet, Take 0.5 mg by mouth daily as needed for anxiety or sleep., Disp: , Rfl: ;  Cholecalciferol (VITAMIN D-3) 1000 UNITS CAPS, Take by mouth 2 (two) times daily., Disp: , Rfl:  citalopram (CELEXA) 20 MG tablet, Take 1 tablet (20 mg total) by mouth daily., Disp: 30 tablet, Rfl: 3;  dexamethasone (DECADRON) 4 MG tablet, Take 2 tablets (8 mg total) by mouth 2 (two) times daily with a meal. Start the day after chemotherapy for 2 days. Take with food., Disp: 30 tablet, Rfl: 1;  ibuprofen (ADVIL,MOTRIN) 200 MG tablet, Take 200-400 mg by mouth every 6 (six) hours as needed for mild pain. , Disp: , Rfl:  lidocaine-prilocaine (EMLA) cream, Apply 1 application topically as needed. Apply nickel sized amount to portacath site at least 1 hour prior to chemotherapy., Disp: 30 g, Rfl: 1;  LORazepam (ATIVAN) 0.5 MG tablet, Take 1 tablet (0.5 mg total) by mouth every 6 (six) hours as needed (Nausea or vomiting)., Disp: 30 tablet, Rfl: 0;  Multiple Vitamin (MULTIVITAMIN) tablet, Take 1 tablet by mouth daily., Disp:  , Rfl:  ondansetron (ZOFRAN) 8 MG tablet, Take 1 tablet (8 mg total) by mouth 2 (two) times daily. Start the day after chemo for 2 days. Then take as needed for nausea or vomiting., Disp: 30 tablet, Rfl: 1;  prochlorperazine (COMPAZINE) 10 MG tablet, Take 1 tablet (10 mg total) by mouth every 6 (six) hours as needed (Nausea or vomiting)., Disp: 30 tablet, Rfl: 1 lidocaine (LIDODERM) 5 %, Place 1 patch onto the skin daily. Remove & Discard patch within 12 hours or as directed by MD, Disp: 30 patch, Rfl: 3 No current facility-administered medications for this visit. Facility-Administered Medications Ordered in Other Visits: 0.9 %  sodium chloride infusion, , Intravenous, Once, [name], MD;  acetaminophen (TYLENOL) tablet 650 mg, 650 mg, Oral, Once, [name], MD;  brentuximab vedotin (ADCETRIS) 145 mg in sodium chloride 0.9 % 100 mL chemo infusion, 1.8 mg/kg (Treatment Plan Actual), Intravenous, Once, [name], MD dexamethasone (DECADRON) injection 10 mg, 10 mg, Intravenous, Once, [name], MD;  diphenhydrAMINE (BENADRYL) injection 50 mg, 50 mg, Intravenous, Once, [name], MD;  heparin lock flush 100 unit/mL, 500 Units, Intracatheter, Once PRN, [name], MD;  ondansetron (ZOFRAN) IVPB 8 mg, 8 mg, Intravenous, Once, [name], MD sodium chloride 0.9 % injection 10 mL, 10 mL, Intracatheter, PRN, [name], MD, 10 mL at 06/17/14 1355;  sodium chloride 0.9 %  injection 10 mL, 10 mL, Intracatheter, PRN, [name], MD  Allergies: No Known Allergies  Past Medical History, Surgical history, Social history, and Family History were reviewed and updated.  Review of Systems: As above  Physical Exam:  height is 5\' 10"  (1.778 m) and weight is 175 lb (79.379 kg). His oral temperature is 98.8 F (37.1 C). His blood pressure is 125/83 and his pulse is 78. His respiration is 18.   Well-developed and well-nourished white gentleman. Head and neck exam shows no  ocular or oral lesions. There are no palpable cervical or supraclavicular lymph nodes. Lungs are clear. Cardiac exam regular rate and rhythm. Abdomen soft. Has good bowel sounds. There is a fluid wave. There is no palpable liver or spleen tip. Cardiac exam shows a thoracoscopy site in the left lateral chest wall. This is healed. There is no swelling or erythema. Extremities shows no clubbing, cyanosis or edema. Skin exam no rashes. Neurological exam is nonfocal.  Lab Results  Component Value Date   WBC 3.3* 08/19/2014   HGB 14.1 08/19/2014   HCT 41.4 08/19/2014   MCV 96 08/19/2014   PLT 235 08/19/2014     Chemistry      Component Value Date/Time   NA 143 08/19/2014 1152   NA 141 06/02/2014 0335   NA 142 03/25/2013 0828   K 4.4 08/19/2014 1152   K 4.0 06/02/2014 0335   K 4.1 03/25/2013 0828   CL 102 08/19/2014 1152   CL 101 06/02/2014 0335   CL 107 03/25/2013 0828   CO2 28 08/19/2014 1152   CO2 27 06/02/2014 0335   CO2 27 03/25/2013 0828   BUN 22 08/19/2014 1152   BUN 16 06/02/2014 0335   BUN 12.7 03/25/2013 0828   CREATININE 1.1 08/19/2014 1152   CREATININE 1.00 06/02/2014 0335   CREATININE 0.9 03/25/2013 0828      Component Value Date/Time   CALCIUM 9.2 08/19/2014 1152   CALCIUM 9.3 06/02/2014 0335   CALCIUM 9.2 03/25/2013 0828   ALKPHOS 57 08/19/2014 1152   ALKPHOS 71 06/02/2014 0335   ALKPHOS 122 03/25/2013 0828   AST 26 08/19/2014 1152   AST 24 06/02/2014 0335   AST 33 03/25/2013 0828   ALT 28 08/19/2014 1152   ALT 20 06/02/2014 0335   ALT 65* 03/25/2013 0828   BILITOT 1.00 08/19/2014 1152   BILITOT 0.5 06/02/2014 0335   BILITOT 0.31 03/25/2013 0828         Impression and Plan: [name] is a 53 year old gentleman. She has history of recurrent Hodgkin's disease. 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 He then had a recurrence after this.  We were thinking of trying to do an allogeneic bone marrow transplant However, he does not want to go through this. He realizes that there  are a lot of side effects with an allogeneic transplant. He also knows that in a chair transfer and probably is the best way in the way to cure this.  He feels well. He's had really very few, if any, complications from treatment.  We will get a PET scan on him. We will see what the PET scan shows.   [name], MD 9/24/20151:16 PM

## 2014-08-27 ENCOUNTER — Ambulatory Visit (HOSPITAL_BASED_OUTPATIENT_CLINIC_OR_DEPARTMENT_OTHER): Payer: Commercial Managed Care - PPO

## 2014-08-27 VITALS — BP 119/79 | HR 84 | Temp 97.9°F | Resp 18

## 2014-08-27 DIAGNOSIS — E291 Testicular hypofunction: Secondary | ICD-10-CM

## 2014-08-27 DIAGNOSIS — R634 Abnormal weight loss: Secondary | ICD-10-CM

## 2014-08-27 MED ORDER — TESTOSTERONE CYPIONATE 200 MG/ML IM SOLN
INTRAMUSCULAR | Status: AC
  Filled 2014-08-27: qty 2

## 2014-08-27 MED ORDER — TESTOSTERONE CYPIONATE 200 MG/ML IM SOLN
400.0000 mg | INTRAMUSCULAR | Status: DC
  Administered 2014-08-27: 400 mg via INTRAMUSCULAR

## 2014-08-27 NOTE — Patient Instructions (Signed)

## 2014-09-06 ENCOUNTER — Ambulatory Visit (HOSPITAL_COMMUNITY)
Admission: RE | Admit: 2014-09-06 | Discharge: 2014-09-06 | Disposition: A | Discharge status: Home / Self Care | Payer: Commercial Managed Care - PPO | Source: Ambulatory Visit | Attending: Hematology & Oncology | Admitting: Hematology & Oncology

## 2014-09-06 ENCOUNTER — Encounter (HOSPITAL_COMMUNITY): Payer: Self-pay

## 2014-09-06 DIAGNOSIS — C8195 Hodgkin lymphoma, unspecified, lymph nodes of inguinal region and lower limb: Secondary | ICD-10-CM | POA: Diagnosis present

## 2014-09-06 DIAGNOSIS — Z9221 Personal history of antineoplastic chemotherapy: Secondary | ICD-10-CM | POA: Insufficient documentation

## 2014-09-06 LAB — GLUCOSE, CAPILLARY: Glucose-Capillary: 71 mg/dL (ref 70–99)

## 2014-09-06 MED ORDER — FLUDEOXYGLUCOSE F - 18 (FDG) INJECTION
8.6900 | Freq: Once | INTRAVENOUS | Status: AC | PRN
  Administered 2014-09-06: 8.69 via INTRAVENOUS

## 2014-09-08 ENCOUNTER — Other Ambulatory Visit: Payer: Self-pay | Admitting: *Deleted

## 2014-09-08 DIAGNOSIS — C8195 Hodgkin lymphoma, unspecified, lymph nodes of inguinal region and lower limb: Secondary | ICD-10-CM

## 2014-09-08 MED ORDER — CITALOPRAM HYDROBROMIDE 20 MG PO TABS: 20.0000 mg | ORAL_TABLET | Freq: Every day | ORAL | Status: DC

## 2014-09-09 ENCOUNTER — Ambulatory Visit (HOSPITAL_BASED_OUTPATIENT_CLINIC_OR_DEPARTMENT_OTHER): Payer: Commercial Managed Care - PPO

## 2014-09-09 ENCOUNTER — Other Ambulatory Visit (HOSPITAL_BASED_OUTPATIENT_CLINIC_OR_DEPARTMENT_OTHER): Payer: Commercial Managed Care - PPO | Admitting: Lab

## 2014-09-09 ENCOUNTER — Ambulatory Visit (HOSPITAL_BASED_OUTPATIENT_CLINIC_OR_DEPARTMENT_OTHER): Payer: Commercial Managed Care - PPO | Admitting: Hematology & Oncology

## 2014-09-09 ENCOUNTER — Encounter: Payer: Self-pay | Admitting: Hematology & Oncology

## 2014-09-09 VITALS — BP 132/88 | HR 83 | Temp 97.8°F | Resp 18 | Ht 70.0 in | Wt 180.0 lb

## 2014-09-09 DIAGNOSIS — C8195 Hodgkin lymphoma, unspecified, lymph nodes of inguinal region and lower limb: Secondary | ICD-10-CM

## 2014-09-09 DIAGNOSIS — Z5112 Encounter for antineoplastic immunotherapy: Secondary | ICD-10-CM

## 2014-09-09 DIAGNOSIS — C8190 Hodgkin lymphoma, unspecified, unspecified site: Secondary | ICD-10-CM

## 2014-09-09 LAB — CBC WITH DIFFERENTIAL (CANCER CENTER ONLY)
BASO#: 0.1 10*3/uL (ref 0.0–0.2)
BASO%: 1.6 % (ref 0.0–2.0)
EOS ABS: 0.1 10*3/uL (ref 0.0–0.5)
EOS%: 3.9 % (ref 0.0–7.0)
HCT: 42 % (ref 38.7–49.9)
HGB: 14.2 g/dL (ref 13.0–17.1)
LYMPH#: 0.7 10*3/uL — ABNORMAL LOW (ref 0.9–3.3)
LYMPH%: 21.8 % (ref 14.0–48.0)
MCH: 32.6 pg (ref 28.0–33.4)
MCHC: 33.8 g/dL (ref 32.0–35.9)
MCV: 96 fL (ref 82–98)
MONO#: 0.6 10*3/uL (ref 0.1–0.9)
MONO%: 19.8 % — ABNORMAL HIGH (ref 0.0–13.0)
NEUT#: 1.6 10*3/uL (ref 1.5–6.5)
NEUT%: 52.9 % (ref 40.0–80.0)
PLATELETS: 227 10*3/uL (ref 145–400)
RBC: 4.36 10*6/uL (ref 4.20–5.70)
RDW: 13.6 % (ref 11.1–15.7)
WBC: 3.1 10*3/uL — ABNORMAL LOW (ref 4.0–10.0)

## 2014-09-09 LAB — CMP (CANCER CENTER ONLY)
ALT(SGPT): 22 U/L (ref 10–47)
AST: 19 U/L (ref 11–38)
Albumin: 4 g/dL (ref 3.3–5.5)
Alkaline Phosphatase: 53 U/L (ref 26–84)
BILIRUBIN TOTAL: 0.6 mg/dL (ref 0.20–1.60)
BUN, Bld: 17 mg/dL (ref 7–22)
CO2: 27 meq/L (ref 18–33)
CREATININE: 1 mg/dL (ref 0.6–1.2)
Calcium: 9.5 mg/dL (ref 8.0–10.3)
Chloride: 100 mEq/L (ref 98–108)
GLUCOSE: 84 mg/dL (ref 73–118)
Potassium: 4.7 mEq/L (ref 3.3–4.7)
Sodium: 141 mEq/L (ref 128–145)
Total Protein: 7.1 g/dL (ref 6.4–8.1)

## 2014-09-09 LAB — LACTATE DEHYDROGENASE: LDH: 136 U/L (ref 94–250)

## 2014-09-09 MED ORDER — ACETAMINOPHEN 325 MG PO TABS
650.0000 mg | ORAL_TABLET | Freq: Once | ORAL | Status: AC
  Administered 2014-09-09: 650 mg via ORAL

## 2014-09-09 MED ORDER — ACETAMINOPHEN 325 MG PO TABS
ORAL_TABLET | ORAL | Status: AC
  Filled 2014-09-09: qty 2

## 2014-09-09 MED ORDER — SODIUM CHLORIDE 0.9 % IV SOLN
Freq: Once | INTRAVENOUS | Status: AC
  Administered 2014-09-09: 13:00:00 via INTRAVENOUS

## 2014-09-09 MED ORDER — DEXAMETHASONE SODIUM PHOSPHATE 10 MG/ML IJ SOLN
10.0000 mg | Freq: Once | INTRAMUSCULAR | Status: AC
  Administered 2014-09-09: 10 mg via INTRAVENOUS

## 2014-09-09 MED ORDER — ONDANSETRON 8 MG/50ML IVPB (CHCC)
8.0000 mg | Freq: Once | INTRAVENOUS | Status: AC
  Administered 2014-09-09: 8 mg via INTRAVENOUS

## 2014-09-09 MED ORDER — DEXAMETHASONE SODIUM PHOSPHATE 10 MG/ML IJ SOLN
INTRAMUSCULAR | Status: AC
  Filled 2014-09-09: qty 1

## 2014-09-09 MED ORDER — SODIUM CHLORIDE 0.9 % IJ SOLN
10.0000 mL | INTRAMUSCULAR | Status: DC | PRN
  Administered 2014-09-09: 10 mL
  Filled 2014-09-09: qty 10

## 2014-09-09 MED ORDER — DIPHENHYDRAMINE HCL 50 MG/ML IJ SOLN
INTRAMUSCULAR | Status: AC
  Filled 2014-09-09: qty 1

## 2014-09-09 MED ORDER — DIPHENHYDRAMINE HCL 50 MG/ML IJ SOLN
50.0000 mg | Freq: Once | INTRAMUSCULAR | Status: AC
  Administered 2014-09-09: 50 mg via INTRAVENOUS

## 2014-09-09 MED ORDER — SODIUM CHLORIDE 0.9 % IV SOLN
1.8000 mg/kg | Freq: Once | INTRAVENOUS | Status: AC
  Administered 2014-09-09: 145 mg via INTRAVENOUS
  Filled 2014-09-09: qty 29

## 2014-09-09 MED ORDER — HEPARIN SOD (PORK) LOCK FLUSH 100 UNIT/ML IV SOLN
500.0000 [IU] | Freq: Once | INTRAVENOUS | Status: AC | PRN
  Administered 2014-09-09: 500 [IU]
  Filled 2014-09-09: qty 5

## 2014-09-09 NOTE — Patient Instructions (Signed)
Brentuximab vedotin solution for injection What is this medicine? BRENTUXIMAB VEDOTIN (bren TUX see mab ve DOE tin) is a chemotherapy drug. It targets a specific protein within cancer cells and stops the cancer cells from growing. This medicine is used for treating Hodgkin's lymphoma and certain non-Hodgkin's lymphomas, such as systemic anaplastic large cell lymphoma. This medicine may be used for other purposes; ask your health care provider or pharmacist if you have questions. COMMON BRAND NAME(S): ADCETRIS What should I tell my health care provider before I take this medicine? They need to know if you have any of these conditions: -immune system problems -infection (especially a virus infection such as chickenpox, cold sores, or herpes) -kidney disease -liver disease -low blood counts, like low white cell, platelet, or red cell counts -tingling of the fingers or toes, or other nerve disorder -an unusual or allergic reaction to brentuximab vedotin, other medicines, foods, dyes, or preservatives -pregnant or trying to get pregnant -breast-feeding How should I use this medicine? This medicine is for infusion into a vein. It is given by a health care professional in a hospital or clinic setting. Talk to your pediatrician regarding the use of this medicine in children. Special care may be needed. Overdosage: If you think you've taken too much of this medicine contact a poison control center or emergency room at once. Overdosage: If you think you have taken too much of this medicine contact a poison control center or emergency room at once. NOTE: This medicine is only for you. Do not share this medicine with others. What if I miss a dose? It is important not to miss your dose. Call your doctor or health care professional if you are unable to keep an appointment. What may interact with this medicine? This medicine may interact with the following medications: -ketoconazole -rifampin -St.  John's wort; Hypericum perforatum This list may not describe all possible interactions. Give your health care provider a list of all the medicines, herbs, non-prescription drugs, or dietary supplements you use. Also tell them if you smoke, drink alcohol, or use illegal drugs. Some items may interact with your medicine. What should I watch for while using this medicine? Visit your doctor for checks on your progress. This drug may make you feel generally unwell. This is not uncommon, as chemotherapy can affect healthy cells as well as cancer cells. Report any side effects. Continue your course of treatment even though you feel ill unless your doctor tells you to stop. Call your doctor or health care professional for advice if you get a fever, chills or sore throat, or other symptoms of a cold or flu. Do not treat yourself. This drug decreases your body's ability to fight infections. Try to avoid being around people who are sick. This medicine may increase your risk to bruise or bleed. Call your doctor or health care professional if you notice any unusual bleeding. In some patients, this medicine may cause a serious brain infection that may cause death. If you have any problems seeing, thinking, speaking, walking, or standing, tell your doctor right away. If you cannot reach your doctor, urgently seek other source of medical care. Do not become pregnant while taking this medicine. Women should inform their doctor if they wish to become pregnant or think they might be pregnant. There is a potential for serious side effects to an unborn child. Talk to your health care professional or pharmacist for more information. Do not breast-feed an infant while taking this medicine. What side effects   may I notice from receiving this medicine? Side effects that you should report to your doctor or health care professional as soon as possible: -allergic reactions like skin rash, itching or hives, swelling of the face, lips,  or tongue -changes in emotions or moods -low blood counts - this medicine may decrease the number of white blood cells, red blood cells and platelets. You may be at increased risk for infections and bleeding. -pain, tingling, numbness in the hands or feet -redness, blistering, peeling or loosening of the skin, including inside the mouth -shortness of breath -signs of infection - fever or chills, cough, sore throat, pain or difficulty passing urine -signs of decreased platelets or bleeding - bruising, pinpoint red spots on the skin, black, tarry stools, blood in the urine -signs of decreased red blood cells - unusually weak or tired, fainting spells, lightheadedness -signs of liver injury like dark yellow or brown urine; general ill feeling or flu-like symptoms; light-colored stools; loss of appetite; nausea; right upper belly pain; yellowing of the eyes or skin -stomach pain -sudden numbness or weakness of the face, arm or leg Side effects that usually do not require medical attention (Report these to your doctor or health care professional if they continue or are bothersome): -diarrhea -dizziness -headache -muscle pain -nausea, vomiting -tiredness This list may not describe all possible side effects. Call your doctor for medical advice about side effects. You may report side effects to FDA at 1-800-FDA-1088. Where should I keep my medicine? This drug is given in a hospital or clinic and will not be stored at home. NOTE: This sheet is a summary. It may not cover all possible information. If you have questions about this medicine, talk to your doctor, pharmacist, or health care provider.  2015, [employer]. (2014-02-24 16:52:15)  

## 2014-09-10 NOTE — Progress Notes (Signed)
Hematology and Oncology Follow Up Visit  Brian [name] [id number] 09-08-1961 53 y.o. 09/10/2014   Principle Diagnosis:  Recurrent Hodgkin's Disease -  S/p autologous transplant   Current Therapy:    S/p Adcetris x 4 cycles     Interim History:  [name] is in for followup. Is doing very well. He feels good. He still has a little bit of numbness where he had in the thoracoscopy. He has a little bit of swelling in the left upper abdomen. This has been a chronic. 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  We did do a PET scan on him. This was done on October 12. He had some left pleural calcification and surgical clips. He had very little activity in this area. No masses noted. He had bilateral adrenal hypermetabolism. Again no masses. There was decreased size of a prevascular node. This is not hypermetabolic.  He's had no cough. He's had no increasing fatigue. He is exercising a little more. He has had no change in bowel or bladder habits. He's had no rashes. He's eating well. He is gaining weight. He's had no fever.  We have given him some testosterone in the past.  Overall, his performance status is ECOG 1.  He does not want to go through and an allogeneic transplant. His overall performance status is ECOG 1  Medications: Current outpatient prescriptions:acetaminophen (TYLENOL) 325 MG tablet, Take 325 mg by mouth every 6 (six) hours as needed for mild pain., Disp: , Rfl: ;  ALPRAZolam (XANAX) 0.5 MG tablet, Take 0.5 mg by mouth daily as needed for anxiety or sleep., Disp: , Rfl: ;  Cholecalciferol (VITAMIN D-3) 1000 UNITS CAPS, Take by mouth 2 (two) times daily., Disp: , Rfl:  citalopram (CELEXA) 20 MG tablet, Take 1 tablet (20 mg total) by mouth daily., Disp: 30 tablet, Rfl: 6;  dexamethasone (DECADRON) 4 MG tablet, Take 2 tablets (8 mg total) by mouth 2 (two) times daily with a meal. Start the day after chemotherapy for 2 days. Take with food., Disp: 30 tablet, Rfl: 1;  ibuprofen  (ADVIL,MOTRIN) 200 MG tablet, Take 200-400 mg by mouth every 6 (six) hours as needed for mild pain. , Disp: , Rfl:  lidocaine (LIDODERM) 5 %, Place 1 patch onto the skin daily. Remove & Discard patch within 12 hours or as directed by MD, Disp: 30 patch, Rfl: 3;  lidocaine-prilocaine (EMLA) cream, Apply 1 application topically as needed. Apply nickel sized amount to portacath site at least 1 hour prior to chemotherapy., Disp: 30 g, Rfl: 1 LORazepam (ATIVAN) 0.5 MG tablet, Take 1 tablet (0.5 mg total) by mouth every 6 (six) hours as needed (Nausea or vomiting)., Disp: 30 tablet, Rfl: 0;  Multiple Vitamin (MULTIVITAMIN) tablet, Take 1 tablet by mouth daily., Disp: , Rfl: ;  ondansetron (ZOFRAN) 8 MG tablet, Take 1 tablet (8 mg total) by mouth 2 (two) times daily. Start the day after chemo for 2 days. Then take as needed for nausea or vomiting., Disp: 30 tablet, Rfl: 1 prochlorperazine (COMPAZINE) 10 MG tablet, Take 1 tablet (10 mg total) by mouth every 6 (six) hours as needed (Nausea or vomiting)., Disp: 30 tablet, Rfl: 1 No current facility-administered medications for this visit. Facility-Administered Medications Ordered in Other Visits: sodium chloride 0.9 % injection 10 mL, 10 mL, Intracatheter, PRN, [name], MD, 10 mL at 06/17/14 1355  Allergies: No Known Allergies  Past Medical History, Surgical history, Social history, and Family History were reviewed and  updated.  Review of Systems: As above  Physical Exam:  height is 5\' 10"  (1.778 m) and weight is 180 lb (81.647 kg). His oral temperature is 97.8 F (36.6 C). His blood pressure is 132/88 and his pulse is 83. His respiration is 18.   Well-developed and well-nourished white gentleman. Head and neck exam shows no ocular or oral lesions. There are no palpable cervical or supraclavicular lymph nodes. Lungs are clear. Cardiac exam regular rate and rhythm. Abdomen soft. Has good bowel sounds. There is a fluid wave. There is no palpable liver  or spleen tip. Cardiac exam shows a thoracoscopy site in the left lateral chest wall. This is healed. There is no swelling or erythema. Extremities shows no clubbing, cyanosis or edema. Skin exam no rashes. Neurological exam is nonfocal.  Lab Results  Component Value Date   WBC 3.1* 09/09/2014   HGB 14.2 09/09/2014   HCT 42.0 09/09/2014   MCV 96 09/09/2014   PLT 227 09/09/2014     Chemistry      Component Value Date/Time   NA 141 09/09/2014 1048   NA 141 06/02/2014 0335   NA 142 03/25/2013 0828   K 4.7 09/09/2014 1048   K 4.0 06/02/2014 0335   K 4.1 03/25/2013 0828   CL 100 09/09/2014 1048   CL 101 06/02/2014 0335   CL 107 03/25/2013 0828   CO2 27 09/09/2014 1048   CO2 27 06/02/2014 0335   CO2 27 03/25/2013 0828   BUN 17 09/09/2014 1048   BUN 16 06/02/2014 0335   BUN 12.7 03/25/2013 0828   CREATININE 1.0 09/09/2014 1048   CREATININE 1.00 06/02/2014 0335   CREATININE 0.9 03/25/2013 0828      Component Value Date/Time   CALCIUM 9.5 09/09/2014 1048   CALCIUM 9.3 06/02/2014 0335   CALCIUM 9.2 03/25/2013 0828   ALKPHOS 53 09/09/2014 1048   ALKPHOS 71 06/02/2014 0335   ALKPHOS 122 03/25/2013 0828   AST 19 09/09/2014 1048   AST 24 06/02/2014 0335   AST 33 03/25/2013 0828   ALT 22 09/09/2014 1048   ALT 20 06/02/2014 0335   ALT 65* 03/25/2013 0828   BILITOT 0.60 09/09/2014 1048   BILITOT 0.5 06/02/2014 0335   BILITOT 0.31 03/25/2013 0828         Impression and Plan: Brian [name] is a 53 year old gentleman. She has history of recurrent Hodgkin's disease. 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 He then had a recurrence after this.  We were thinking of trying to do an allogeneic bone marrow transplant However, he does not want to go through this. He realizes that there are a lot of side effects with an allogeneic transplant.  We will continue the Adcetris. I think he is responding well to this. I don't see anything on the PET scan it looks troublesome.  I would not plan for  another PET scan until December.  Again, he does not want me allogeneic transplant. His is worried about all the complications that have been.   He feels well. He's had really very few, if any, complications from treatment.  I'll see him back in 3 weeks. I spent about 40 minutes with he and his wife.Marland Kitchen   [name], MD 10/16/20156:24 PM

## 2014-09-30 ENCOUNTER — Ambulatory Visit (HOSPITAL_BASED_OUTPATIENT_CLINIC_OR_DEPARTMENT_OTHER): Payer: Commercial Managed Care - PPO | Admitting: Hematology & Oncology

## 2014-09-30 ENCOUNTER — Other Ambulatory Visit (HOSPITAL_BASED_OUTPATIENT_CLINIC_OR_DEPARTMENT_OTHER): Payer: Commercial Managed Care - PPO | Admitting: Lab

## 2014-09-30 ENCOUNTER — Encounter: Payer: Self-pay | Admitting: Hematology & Oncology

## 2014-09-30 ENCOUNTER — Ambulatory Visit (HOSPITAL_BASED_OUTPATIENT_CLINIC_OR_DEPARTMENT_OTHER): Payer: Commercial Managed Care - PPO

## 2014-09-30 VITALS — BP 128/78 | HR 78 | Temp 98.0°F | Resp 18 | Ht 70.0 in | Wt 177.0 lb

## 2014-09-30 DIAGNOSIS — E291 Testicular hypofunction: Secondary | ICD-10-CM

## 2014-09-30 DIAGNOSIS — E349 Endocrine disorder, unspecified: Secondary | ICD-10-CM

## 2014-09-30 DIAGNOSIS — C8190 Hodgkin lymphoma, unspecified, unspecified site: Secondary | ICD-10-CM

## 2014-09-30 DIAGNOSIS — R634 Abnormal weight loss: Secondary | ICD-10-CM

## 2014-09-30 DIAGNOSIS — Z5112 Encounter for antineoplastic immunotherapy: Secondary | ICD-10-CM

## 2014-09-30 DIAGNOSIS — C8195 Hodgkin lymphoma, unspecified, lymph nodes of inguinal region and lower limb: Secondary | ICD-10-CM

## 2014-09-30 LAB — CMP (CANCER CENTER ONLY)
ALT: 29 U/L (ref 10–47)
AST: 28 U/L (ref 11–38)
Albumin: 3.8 g/dL (ref 3.3–5.5)
Alkaline Phosphatase: 62 U/L (ref 26–84)
BILIRUBIN TOTAL: 0.7 mg/dL (ref 0.20–1.60)
BUN, Bld: 23 mg/dL — ABNORMAL HIGH (ref 7–22)
CALCIUM: 9.1 mg/dL (ref 8.0–10.3)
CHLORIDE: 100 meq/L (ref 98–108)
CO2: 26 meq/L (ref 18–33)
CREATININE: 1 mg/dL (ref 0.6–1.2)
Glucose, Bld: 92 mg/dL (ref 73–118)
Potassium: 4.1 mEq/L (ref 3.3–4.7)
SODIUM: 142 meq/L (ref 128–145)
TOTAL PROTEIN: 6.8 g/dL (ref 6.4–8.1)

## 2014-09-30 LAB — CBC WITH DIFFERENTIAL (CANCER CENTER ONLY)
BASO#: 0 10*3/uL (ref 0.0–0.2)
BASO%: 1.1 % (ref 0.0–2.0)
EOS%: 6 % (ref 0.0–7.0)
Eosinophils Absolute: 0.2 10*3/uL (ref 0.0–0.5)
HEMATOCRIT: 41.2 % (ref 38.7–49.9)
HGB: 14.2 g/dL (ref 13.0–17.1)
LYMPH#: 0.6 10*3/uL — ABNORMAL LOW (ref 0.9–3.3)
LYMPH%: 17.6 % (ref 14.0–48.0)
MCH: 32.3 pg (ref 28.0–33.4)
MCHC: 34.5 g/dL (ref 32.0–35.9)
MCV: 94 fL (ref 82–98)
MONO#: 0.6 10*3/uL (ref 0.1–0.9)
MONO%: 15.4 % — AB (ref 0.0–13.0)
NEUT#: 2.2 10*3/uL (ref 1.5–6.5)
NEUT%: 59.9 % (ref 40.0–80.0)
PLATELETS: 230 10*3/uL (ref 145–400)
RBC: 4.39 10*6/uL (ref 4.20–5.70)
RDW: 13.8 % (ref 11.1–15.7)
WBC: 3.6 10*3/uL — ABNORMAL LOW (ref 4.0–10.0)

## 2014-09-30 LAB — LACTATE DEHYDROGENASE: LDH: 169 U/L (ref 94–250)

## 2014-09-30 MED ORDER — DIPHENHYDRAMINE HCL 50 MG/ML IJ SOLN
50.0000 mg | Freq: Once | INTRAMUSCULAR | Status: AC
Start: 2014-09-30 — End: 2014-09-30
  Administered 2014-09-30: 50 mg via INTRAVENOUS

## 2014-09-30 MED ORDER — SODIUM CHLORIDE 0.9 % IJ SOLN
10.0000 mL | INTRAMUSCULAR | Status: DC | PRN
  Administered 2014-09-30: 10 mL
  Filled 2014-09-30: qty 10

## 2014-09-30 MED ORDER — ONDANSETRON 8 MG/50ML IVPB (CHCC)
8.0000 mg | Freq: Once | INTRAVENOUS | Status: AC
  Administered 2014-09-30: 8 mg via INTRAVENOUS

## 2014-09-30 MED ORDER — SODIUM CHLORIDE 0.9 % IV SOLN
Freq: Once | INTRAVENOUS | Status: AC
  Administered 2014-09-30: 14:00:00 via INTRAVENOUS

## 2014-09-30 MED ORDER — SODIUM CHLORIDE 0.9 % IV SOLN
1.8000 mg/kg | Freq: Once | INTRAVENOUS | Status: AC
  Administered 2014-09-30: 145 mg via INTRAVENOUS
  Filled 2014-09-30: qty 29

## 2014-09-30 MED ORDER — ACETAMINOPHEN 325 MG PO TABS
ORAL_TABLET | ORAL | Status: AC
  Filled 2014-09-30: qty 1

## 2014-09-30 MED ORDER — ACETAMINOPHEN 325 MG PO TABS
650.0000 mg | ORAL_TABLET | Freq: Once | ORAL | Status: AC
Start: 2014-09-30 — End: 2014-09-30
  Administered 2014-09-30: 650 mg via ORAL

## 2014-09-30 MED ORDER — HEPARIN SOD (PORK) LOCK FLUSH 100 UNIT/ML IV SOLN
500.0000 [IU] | Freq: Once | INTRAVENOUS | Status: AC | PRN
  Administered 2014-09-30: 500 [IU]
  Filled 2014-09-30: qty 5

## 2014-09-30 MED ORDER — DIPHENHYDRAMINE HCL 50 MG/ML IJ SOLN
INTRAMUSCULAR | Status: AC
  Filled 2014-09-30: qty 1

## 2014-09-30 MED ORDER — DEXAMETHASONE SODIUM PHOSPHATE 10 MG/ML IJ SOLN
INTRAMUSCULAR | Status: AC
  Filled 2014-09-30: qty 1

## 2014-09-30 MED ORDER — DEXAMETHASONE SODIUM PHOSPHATE 10 MG/ML IJ SOLN
10.0000 mg | Freq: Once | INTRAMUSCULAR | Status: AC
  Administered 2014-09-30: 10 mg via INTRAVENOUS

## 2014-09-30 NOTE — Patient Instructions (Signed)
[facility] Discharge Instructions for Patients Receiving Chemotherapy  Today you received the following chemotherapy agents Adcetris  To help prevent nausea and vomiting after your treatment, we encourage you to take your nausea medication    If you develop nausea and vomiting that is not controlled by your nausea medication, call the clinic.   BELOW ARE SYMPTOMS THAT SHOULD BE REPORTED IMMEDIATELY:  *FEVER GREATER THAN 100.5 F  *CHILLS WITH OR WITHOUT FEVER  NAUSEA AND VOMITING THAT IS NOT CONTROLLED WITH YOUR NAUSEA MEDICATION  *UNUSUAL SHORTNESS OF BREATH  *UNUSUAL BRUISING OR BLEEDING  TENDERNESS IN MOUTH AND THROAT WITH OR WITHOUT PRESENCE OF ULCERS  *URINARY PROBLEMS  *BOWEL PROBLEMS  UNUSUAL RASH Items with * indicate a potential emergency and should be followed up as soon as possible.  Feel free to call the clinic you have any questions or concerns. The clinic phone number is [phone].    

## 2014-09-30 NOTE — Progress Notes (Signed)
Hematology and Oncology Follow Up Visit  Brian [name] [id number] September 18, 1961 53 y.o. 09/30/2014   Principle Diagnosis:  Recurrent Hodgkin's Disease -  S/p autologous transplant   Current Therapy:    S/p Adcetris x 4 cycles     Interim History:  [name] is in for followup. Is doing very well. He feels good. He is still working. He is trying to exercise more. He is worried a little bit about the weight gain.  He's had no problems with cough. He still has the numbness over on the left upper abdomen. This may be present for several months.  He's had no change in bowel or bladder habits. He's had no rashes. He's had no fever. He's had no headache. There's been no mouth sores. Overall, his performance status is ECOG 1.  He does not want to go through with an allogeneic transplant. His overall performance status is ECOG 1  Medications: Current outpatient prescriptions: acetaminophen (TYLENOL) 325 MG tablet, Take 325 mg by mouth every 6 (six) hours as needed for mild pain., Disp: , Rfl: ;  ALPRAZolam (XANAX) 0.5 MG tablet, Take 0.5 mg by mouth daily as needed for anxiety or sleep., Disp: , Rfl: ;  Cholecalciferol (VITAMIN D-3) 1000 UNITS CAPS, Take by mouth 2 (two) times daily., Disp: , Rfl:  citalopram (CELEXA) 20 MG tablet, Take 1 tablet (20 mg total) by mouth daily., Disp: 30 tablet, Rfl: 6;  dexamethasone (DECADRON) 4 MG tablet, Take 2 tablets (8 mg total) by mouth 2 (two) times daily with a meal. Start the day after chemotherapy for 2 days. Take with food., Disp: 30 tablet, Rfl: 1;  ibuprofen (ADVIL,MOTRIN) 200 MG tablet, Take 200-400 mg by mouth every 6 (six) hours as needed for mild pain. , Disp: , Rfl:  lidocaine-prilocaine (EMLA) cream, Apply 1 application topically as needed. Apply nickel sized amount to portacath site at least 1 hour prior to chemotherapy., Disp: 30 g, Rfl: 1;  LORazepam (ATIVAN) 0.5 MG tablet, Take 1 tablet (0.5 mg total) by mouth every 6 (six) hours as needed (Nausea  or vomiting)., Disp: 30 tablet, Rfl: 0;  Multiple Vitamin (MULTIVITAMIN) tablet, Take 1 tablet by mouth daily., Disp: , Rfl:  ondansetron (ZOFRAN) 8 MG tablet, Take 1 tablet (8 mg total) by mouth 2 (two) times daily. Start the day after chemo for 2 days. Then take as needed for nausea or vomiting., Disp: 30 tablet, Rfl: 1;  prochlorperazine (COMPAZINE) 10 MG tablet, Take 1 tablet (10 mg total) by mouth every 6 (six) hours as needed (Nausea or vomiting)., Disp: 30 tablet, Rfl: 1 lidocaine (LIDODERM) 5 %, Place 1 patch onto the skin daily. Remove & Discard patch within 12 hours or as directed by MD, Disp: 30 patch, Rfl: 3 No current facility-administered medications for this visit. Facility-Administered Medications Ordered in Other Visits: sodium chloride 0.9 % injection 10 mL, 10 mL, Intracatheter, PRN, [name], MD, 10 mL at 06/17/14 1355  Allergies: No Known Allergies  Past Medical History, Surgical history, Social history, and Family History were reviewed and updated.  Review of Systems: As above  Physical Exam:  height is 5\' 10"  (1.778 m) and weight is 177 lb (80.287 kg). His oral temperature is 98 F (36.7 C). His blood pressure is 128/78 and his pulse is 78. His respiration is 18.   Well-developed and well-nourished white gentleman. Head and neck exam shows no ocular or oral lesions. There are no palpable cervical or supraclavicular lymph nodes. Lungs are  clear. Cardiac exam regular rate and rhythm. Abdomen soft. Has good bowel sounds. There is a fluid wave. There is no palpable liver or spleen tip. Cardiac exam shows a thoracoscopy site in the left lateral chest wall. This is healed. There is no swelling or erythema. Extremities shows no clubbing, cyanosis or edema. Skin exam no rashes. Neurological exam is nonfocal.  Lab Results  Component Value Date   WBC 3.6* 09/30/2014   HGB 14.2 09/30/2014   HCT 41.2 09/30/2014   MCV 94 09/30/2014   PLT 230 09/30/2014     Chemistry        Component Value Date/Time   NA 142 09/30/2014 1217   NA 141 06/02/2014 0335   NA 142 03/25/2013 0828   K 4.1 09/30/2014 1217   K 4.0 06/02/2014 0335   K 4.1 03/25/2013 0828   CL 100 09/30/2014 1217   CL 101 06/02/2014 0335   CL 107 03/25/2013 0828   CO2 26 09/30/2014 1217   CO2 27 06/02/2014 0335   CO2 27 03/25/2013 0828   BUN 23* 09/30/2014 1217   BUN 16 06/02/2014 0335   BUN 12.7 03/25/2013 0828   CREATININE 1.0 09/30/2014 1217   CREATININE 1.00 06/02/2014 0335   CREATININE 0.9 03/25/2013 0828      Component Value Date/Time   CALCIUM 9.1 09/30/2014 1217   CALCIUM 9.3 06/02/2014 0335   CALCIUM 9.2 03/25/2013 0828   ALKPHOS 62 09/30/2014 1217   ALKPHOS 71 06/02/2014 0335   ALKPHOS 122 03/25/2013 0828   AST 28 09/30/2014 1217   AST 24 06/02/2014 0335   AST 33 03/25/2013 0828   ALT 29 09/30/2014 1217   ALT 20 06/02/2014 0335   ALT 65* 03/25/2013 0828   BILITOT 0.70 09/30/2014 1217   BILITOT 0.5 06/02/2014 0335   BILITOT 0.31 03/25/2013 0828         Impression and Plan: Brian [name] is a 53 year old gentleman.  He has history of recurrent Hodgkin's disease. 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 He then had a recurrence after this.  We were thinking of trying to do an allogeneic bone marrow transplant However, he does not want to go through this. He realizes that there are a lot of side effects with an allogeneic transplant.  We will continue the Adcetris. I think he is responding well to this. I don't see anything on the PET scan  that looks troublesome.  I would not plan for another PET scan until December.  Again, he does not want an allogeneic transplant. His is worried about all the complications that have been reported.   He feels well. He's had really very few, if any, complications from treatment.  I'll see him back in 3 weeks. I spent about 40 minutes with he and his wife.Marland Kitchen   [name], MD 11/5/20151:48 PM

## 2014-10-22 ENCOUNTER — Ambulatory Visit (HOSPITAL_BASED_OUTPATIENT_CLINIC_OR_DEPARTMENT_OTHER): Payer: Commercial Managed Care - PPO

## 2014-10-22 ENCOUNTER — Encounter: Payer: Self-pay | Admitting: Hematology & Oncology

## 2014-10-22 ENCOUNTER — Ambulatory Visit (HOSPITAL_BASED_OUTPATIENT_CLINIC_OR_DEPARTMENT_OTHER): Payer: Commercial Managed Care - PPO | Admitting: Hematology & Oncology

## 2014-10-22 ENCOUNTER — Other Ambulatory Visit (HOSPITAL_BASED_OUTPATIENT_CLINIC_OR_DEPARTMENT_OTHER): Payer: Commercial Managed Care - PPO | Admitting: Lab

## 2014-10-22 VITALS — BP 126/98 | HR 75 | Temp 98.4°F | Resp 20 | Wt 180.0 lb

## 2014-10-22 DIAGNOSIS — Z5112 Encounter for antineoplastic immunotherapy: Secondary | ICD-10-CM

## 2014-10-22 DIAGNOSIS — R634 Abnormal weight loss: Secondary | ICD-10-CM

## 2014-10-22 DIAGNOSIS — E349 Endocrine disorder, unspecified: Secondary | ICD-10-CM

## 2014-10-22 DIAGNOSIS — C8195 Hodgkin lymphoma, unspecified, lymph nodes of inguinal region and lower limb: Secondary | ICD-10-CM

## 2014-10-22 DIAGNOSIS — C8190 Hodgkin lymphoma, unspecified, unspecified site: Secondary | ICD-10-CM

## 2014-10-22 LAB — CMP (CANCER CENTER ONLY)
ALBUMIN: 3.5 g/dL (ref 3.3–5.5)
ALK PHOS: 68 U/L (ref 26–84)
ALT: 29 U/L (ref 10–47)
AST: 32 U/L (ref 11–38)
BILIRUBIN TOTAL: 0.7 mg/dL (ref 0.20–1.60)
BUN, Bld: 18 mg/dL (ref 7–22)
CO2: 26 mEq/L (ref 18–33)
Calcium: 9.2 mg/dL (ref 8.0–10.3)
Chloride: 102 mEq/L (ref 98–108)
Creat: 1 mg/dl (ref 0.6–1.2)
GLUCOSE: 82 mg/dL (ref 73–118)
POTASSIUM: 4.3 meq/L (ref 3.3–4.7)
SODIUM: 145 meq/L (ref 128–145)
TOTAL PROTEIN: 7.1 g/dL (ref 6.4–8.1)

## 2014-10-22 LAB — CBC WITH DIFFERENTIAL (CANCER CENTER ONLY)
BASO#: 0 10*3/uL (ref 0.0–0.2)
BASO%: 0.9 % (ref 0.0–2.0)
EOS%: 8.3 % — AB (ref 0.0–7.0)
Eosinophils Absolute: 0.3 10*3/uL (ref 0.0–0.5)
HEMATOCRIT: 40.1 % (ref 38.7–49.9)
HGB: 13.8 g/dL (ref 13.0–17.1)
LYMPH#: 0.6 10*3/uL — ABNORMAL LOW (ref 0.9–3.3)
LYMPH%: 18.3 % (ref 14.0–48.0)
MCH: 32.3 pg (ref 28.0–33.4)
MCHC: 34.4 g/dL (ref 32.0–35.9)
MCV: 94 fL (ref 82–98)
MONO#: 0.6 10*3/uL (ref 0.1–0.9)
MONO%: 17.8 % — ABNORMAL HIGH (ref 0.0–13.0)
NEUT#: 1.9 10*3/uL (ref 1.5–6.5)
NEUT%: 54.7 % (ref 40.0–80.0)
Platelets: 208 10*3/uL (ref 145–400)
RBC: 4.27 10*6/uL (ref 4.20–5.70)
RDW: 14.2 % (ref 11.1–15.7)
WBC: 3.4 10*3/uL — ABNORMAL LOW (ref 4.0–10.0)

## 2014-10-22 LAB — LACTATE DEHYDROGENASE: LDH: 167 U/L (ref 94–250)

## 2014-10-22 LAB — TESTOSTERONE: Testosterone: 383 ng/dL (ref 300–890)

## 2014-10-22 MED ORDER — ONDANSETRON 8 MG/50ML IVPB (CHCC)
8.0000 mg | Freq: Once | INTRAVENOUS | Status: AC
  Administered 2014-10-22: 8 mg via INTRAVENOUS

## 2014-10-22 MED ORDER — SODIUM CHLORIDE 0.9 % IJ SOLN
10.0000 mL | INTRAMUSCULAR | Status: DC | PRN
  Administered 2014-10-22: 10 mL
  Filled 2014-10-22: qty 10

## 2014-10-22 MED ORDER — SODIUM CHLORIDE 0.9 % IV SOLN
1.8000 mg/kg | Freq: Once | INTRAVENOUS | Status: AC
  Administered 2014-10-22: 145 mg via INTRAVENOUS
  Filled 2014-10-22: qty 29

## 2014-10-22 MED ORDER — SODIUM CHLORIDE 0.9 % IV SOLN
Freq: Once | INTRAVENOUS | Status: AC
  Administered 2014-10-22: 10:00:00 via INTRAVENOUS

## 2014-10-22 MED ORDER — DEXAMETHASONE SODIUM PHOSPHATE 10 MG/ML IJ SOLN
INTRAMUSCULAR | Status: AC
  Filled 2014-10-22: qty 1

## 2014-10-22 MED ORDER — HEPARIN SOD (PORK) LOCK FLUSH 100 UNIT/ML IV SOLN
500.0000 [IU] | Freq: Once | INTRAVENOUS | Status: AC | PRN
  Administered 2014-10-22: 500 [IU]
  Filled 2014-10-22: qty 5

## 2014-10-22 MED ORDER — ACETAMINOPHEN 325 MG PO TABS
650.0000 mg | ORAL_TABLET | Freq: Once | ORAL | Status: AC
  Administered 2014-10-22: 650 mg via ORAL

## 2014-10-22 MED ORDER — DIPHENHYDRAMINE HCL 50 MG/ML IJ SOLN
50.0000 mg | Freq: Once | INTRAMUSCULAR | Status: AC
Start: 2014-10-22 — End: 2014-10-22
  Administered 2014-10-22: 50 mg via INTRAVENOUS

## 2014-10-22 MED ORDER — DIPHENHYDRAMINE HCL 50 MG/ML IJ SOLN
INTRAMUSCULAR | Status: AC
  Filled 2014-10-22: qty 1

## 2014-10-22 MED ORDER — ACETAMINOPHEN 325 MG PO TABS
ORAL_TABLET | ORAL | Status: AC
  Filled 2014-10-22: qty 2

## 2014-10-22 MED ORDER — DEXAMETHASONE SODIUM PHOSPHATE 10 MG/ML IJ SOLN
10.0000 mg | Freq: Once | INTRAMUSCULAR | Status: AC
  Administered 2014-10-22: 10 mg via INTRAVENOUS

## 2014-10-22 NOTE — Patient Instructions (Signed)
Brentuximab vedotin solution for injection What is this medicine? BRENTUXIMAB VEDOTIN (bren TUX see mab ve DOE tin) is a chemotherapy drug. It targets a specific protein within cancer cells and stops the cancer cells from growing. This medicine is used for treating Hodgkin's lymphoma and certain non-Hodgkin's lymphomas, such as systemic anaplastic large cell lymphoma. This medicine may be used for other purposes; ask your health care provider or pharmacist if you have questions. COMMON BRAND NAME(S): ADCETRIS What should I tell my health care provider before I take this medicine? They need to know if you have any of these conditions: -immune system problems -infection (especially a virus infection such as chickenpox, cold sores, or herpes) -kidney disease -liver disease -low blood counts, like low white cell, platelet, or red cell counts -tingling of the fingers or toes, or other nerve disorder -an unusual or allergic reaction to brentuximab vedotin, other medicines, foods, dyes, or preservatives -pregnant or trying to get pregnant -breast-feeding How should I use this medicine? This medicine is for infusion into a vein. It is given by a health care professional in a hospital or clinic setting. Talk to your pediatrician regarding the use of this medicine in children. Special care may be needed. Overdosage: If you think you've taken too much of this medicine contact a poison control center or emergency room at once. Overdosage: If you think you have taken too much of this medicine contact a poison control center or emergency room at once. NOTE: This medicine is only for you. Do not share this medicine with others. What if I miss a dose? It is important not to miss your dose. Call your doctor or health care professional if you are unable to keep an appointment. What may interact with this medicine? This medicine may interact with the following medications: -ketoconazole -rifampin -St.  John's wort; Hypericum perforatum This list may not describe all possible interactions. Give your health care provider a list of all the medicines, herbs, non-prescription drugs, or dietary supplements you use. Also tell them if you smoke, drink alcohol, or use illegal drugs. Some items may interact with your medicine. What should I watch for while using this medicine? Visit your doctor for checks on your progress. This drug may make you feel generally unwell. This is not uncommon, as chemotherapy can affect healthy cells as well as cancer cells. Report any side effects. Continue your course of treatment even though you feel ill unless your doctor tells you to stop. Call your doctor or health care professional for advice if you get a fever, chills or sore throat, or other symptoms of a cold or flu. Do not treat yourself. This drug decreases your body's ability to fight infections. Try to avoid being around people who are sick. This medicine may increase your risk to bruise or bleed. Call your doctor or health care professional if you notice any unusual bleeding. In some patients, this medicine may cause a serious brain infection that may cause death. If you have any problems seeing, thinking, speaking, walking, or standing, tell your doctor right away. If you cannot reach your doctor, urgently seek other source of medical care. Do not become pregnant while taking this medicine. Women should inform their doctor if they wish to become pregnant or think they might be pregnant. There is a potential for serious side effects to an unborn child. Talk to your health care professional or pharmacist for more information. Do not breast-feed an infant while taking this medicine. What side effects   may I notice from receiving this medicine? Side effects that you should report to your doctor or health care professional as soon as possible: -allergic reactions like skin rash, itching or hives, swelling of the face, lips,  or tongue -changes in emotions or moods -low blood counts - this medicine may decrease the number of white blood cells, red blood cells and platelets. You may be at increased risk for infections and bleeding. -pain, tingling, numbness in the hands or feet -redness, blistering, peeling or loosening of the skin, including inside the mouth -shortness of breath -signs of infection - fever or chills, cough, sore throat, pain or difficulty passing urine -signs of decreased platelets or bleeding - bruising, pinpoint red spots on the skin, black, tarry stools, blood in the urine -signs of decreased red blood cells - unusually weak or tired, fainting spells, lightheadedness -signs of liver injury like dark yellow or brown urine; general ill feeling or flu-like symptoms; light-colored stools; loss of appetite; nausea; right upper belly pain; yellowing of the eyes or skin -stomach pain -sudden numbness or weakness of the face, arm or leg Side effects that usually do not require medical attention (Report these to your doctor or health care professional if they continue or are bothersome): -diarrhea -dizziness -headache -muscle pain -nausea, vomiting -tiredness This list may not describe all possible side effects. Call your doctor for medical advice about side effects. You may report side effects to FDA at 1-800-FDA-1088. Where should I keep my medicine? This drug is given in a hospital or clinic and will not be stored at home. NOTE: This sheet is a summary. It may not cover all possible information. If you have questions about this medicine, talk to your doctor, pharmacist, or health care provider.  2015, [employer]. (2014-02-24 16:52:15)  

## 2014-10-22 NOTE — Progress Notes (Signed)
Hematology and Oncology Follow Up Visit  Brian [name] [id number] [birth year] 53 y.o. 10/22/2014   Principle Diagnosis:  Recurrent Hodgkin's Disease -  S/p autologous transplant   Current Therapy:    S/p Adcetris x 4 cycles     Interim History:  [name] is in for followup. Is doing very well. He feels good. He is still working. He is trying to exercise more.   He and his family had a good Thanksgiving. He a quite a bit. He did not have any problems with nausea or vomiting.  There's been no cough. He's had no chest wall pain. He still has the numbness over in the left lower lateral ribs from his surgical procedure.  His overall performance status is ECOG 1  Medications: Current outpatient prescriptions: acetaminophen (TYLENOL) 325 MG tablet, Take 325 mg by mouth every 6 (six) hours as needed for mild pain., Disp: , Rfl: ;  ALPRAZolam (XANAX) 0.5 MG tablet, Take 0.5 mg by mouth daily as needed for anxiety or sleep., Disp: , Rfl: ;  Cholecalciferol (VITAMIN D-3) 1000 UNITS CAPS, Take by mouth 2 (two) times daily., Disp: , Rfl:  citalopram (CELEXA) 20 MG tablet, Take 1 tablet (20 mg total) by mouth daily., Disp: 30 tablet, Rfl: 6;  dexamethasone (DECADRON) 4 MG tablet, Take 2 tablets (8 mg total) by mouth 2 (two) times daily with a meal. Start the day after chemotherapy for 2 days. Take with food., Disp: 30 tablet, Rfl: 1;  ibuprofen (ADVIL,MOTRIN) 200 MG tablet, Take 200-400 mg by mouth every 6 (six) hours as needed for mild pain. , Disp: , Rfl:  lidocaine-prilocaine (EMLA) cream, Apply 1 application topically as needed. Apply nickel sized amount to portacath site at least 1 hour prior to chemotherapy., Disp: 30 g, Rfl: 1;  LORazepam (ATIVAN) 0.5 MG tablet, Take 1 tablet (0.5 mg total) by mouth every 6 (six) hours as needed (Nausea or vomiting)., Disp: 30 tablet, Rfl: 0;  Multiple Vitamin (MULTIVITAMIN) tablet, Take 1 tablet by mouth daily., Disp: , Rfl:  ondansetron (ZOFRAN) 8 MG tablet, Take  1 tablet (8 mg total) by mouth 2 (two) times daily. Start the day after chemo for 2 days. Then take as needed for nausea or vomiting., Disp: 30 tablet, Rfl: 1;  prochlorperazine (COMPAZINE) 10 MG tablet, Take 1 tablet (10 mg total) by mouth every 6 (six) hours as needed (Nausea or vomiting)., Disp: 30 tablet, Rfl: 1 lidocaine (LIDODERM) 5 %, Place 1 patch onto the skin daily. Remove & Discard patch within 12 hours or as directed by MD (Patient not taking: Reported on 10/22/2014), Disp: 30 patch, Rfl: 3 No current facility-administered medications for this visit. Facility-Administered Medications Ordered in Other Visits: 0.9 %  sodium chloride infusion, , Intravenous, Once, [name], MD;  acetaminophen (TYLENOL) tablet 650 mg, 650 mg, Oral, Once, [name], MD;  brentuximab vedotin (ADCETRIS) 145 mg in sodium chloride 0.9 % 100 mL chemo infusion, 1.8 mg/kg (Treatment Plan Actual), Intravenous, Once, [name], MD dexamethasone (DECADRON) injection 10 mg, 10 mg, Intravenous, Once, [name], MD;  diphenhydrAMINE (BENADRYL) injection 50 mg, 50 mg, Intravenous, Once, [name], MD;  heparin lock flush 100 unit/mL, 500 Units, Intracatheter, Once PRN, [name], MD;  ondansetron (ZOFRAN) IVPB 8 mg, 8 mg, Intravenous, Once, [name], MD sodium chloride 0.9 % injection 10 mL, 10 mL, Intracatheter, PRN, [name], MD, 10 mL at 06/17/14 1355;  sodium chloride 0.9 % injection  10 mL, 10 mL, Intracatheter, PRN, [name], MD  Allergies: No Known Allergies  Past Medical History, Surgical history, Social history, and Family History were reviewed and updated.  Review of Systems: As above  Physical Exam:  weight is 180 lb (81.647 kg). His oral temperature is 98.4 F (36.9 C). His blood pressure is 126/98 and his pulse is 75. His respiration is 20.   Well-developed and well-nourished white gentleman. Head and neck exam shows no ocular or oral lesions. There are  no palpable cervical or supraclavicular lymph nodes. Lungs are clear. Cardiac exam regular rate and rhythm with no murmurs, rubs or bruits.. Abdomen is soft. He has good bowel sounds. There is no fluid wave. There is no palpable liver or spleen tip. Back exam shows a thoracoscopy site in the left lateral chest wall. This is healed. There is no swelling or erythema. Extremities shows no clubbing, cyanosis or edema. Skin exam shows no rashes, ecchymoses or petechia . Neurological exam is nonfocal.  Lab Results  Component Value Date   WBC 3.4* 10/22/2014   HGB 13.8 10/22/2014   HCT 40.1 10/22/2014   MCV 94 10/22/2014   PLT 208 10/22/2014     Chemistry      Component Value Date/Time   NA 145 10/22/2014 0819   NA 141 06/02/2014 0335   NA 142 03/25/2013 0828   K 4.3 10/22/2014 0819   K 4.0 06/02/2014 0335   K 4.1 03/25/2013 0828   CL 102 10/22/2014 0819   CL 101 06/02/2014 0335   CL 107 03/25/2013 0828   CO2 26 10/22/2014 0819   CO2 27 06/02/2014 0335   CO2 27 03/25/2013 0828   BUN 18 10/22/2014 0819   BUN 16 06/02/2014 0335   BUN 12.7 03/25/2013 0828   CREATININE 1.0 10/22/2014 0819   CREATININE 1.00 06/02/2014 0335   CREATININE 0.9 03/25/2013 0828      Component Value Date/Time   CALCIUM 9.2 10/22/2014 0819   CALCIUM 9.3 06/02/2014 0335   CALCIUM 9.2 03/25/2013 0828   ALKPHOS 68 10/22/2014 0819   ALKPHOS 71 06/02/2014 0335   ALKPHOS 122 03/25/2013 0828   AST 32 10/22/2014 0819   AST 24 06/02/2014 0335   AST 33 03/25/2013 0828   ALT 29 10/22/2014 0819   ALT 20 06/02/2014 0335   ALT 65* 03/25/2013 0828   BILITOT 0.70 10/22/2014 0819   BILITOT 0.5 06/02/2014 0335   BILITOT 0.31 03/25/2013 0828         Impression and Plan: Brian [name] is a 53 year old gentleman.  He has history of recurrent Hodgkin's disease. 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 He then had a recurrence after this.  We were thinking of trying to do an allogeneic bone marrow  transplant However, he does not want to go through this. He realizes that there are a lot of side effects with an allogeneic transplant.  We will continue the Adcetris. I think he is responding well to this. I cannot find anything clinically that would suggest recurrence.  We'll probably will plan for a PET scan in January. I'll see him back in 3 weeks. I spent about 40 minutes with he and his wife.Marland Kitchen   [name], MD 11/27/201510:15 AM

## 2014-10-25 ENCOUNTER — Telehealth: Payer: Self-pay | Admitting: *Deleted

## 2014-10-25 NOTE — Telephone Encounter (Addendum)
Spoke with patient who had seen results on MyChart  ----- Message from [name], MD sent at 10/24/2014  8:21 PM EST ----- Call - testosterone level is ok!!  pete

## 2014-10-26 ENCOUNTER — Other Ambulatory Visit: Payer: Self-pay | Admitting: Emergency Medicine

## 2014-10-27 ENCOUNTER — Other Ambulatory Visit: Payer: Self-pay | Admitting: Radiology

## 2014-10-27 NOTE — Telephone Encounter (Signed)
Faxed Alprazolam to pharmacy 

## 2014-11-08 ENCOUNTER — Telehealth: Payer: Self-pay | Admitting: *Deleted

## 2014-11-08 NOTE — Telephone Encounter (Signed)
Patient called wanting to report new tingling in fingers and toes. Dr Marin Olp notified. Will address with patient during his appointment this Thursday. Called patient back and let him know.

## 2014-11-11 ENCOUNTER — Other Ambulatory Visit (HOSPITAL_BASED_OUTPATIENT_CLINIC_OR_DEPARTMENT_OTHER): Payer: Commercial Managed Care - PPO | Admitting: Lab

## 2014-11-11 ENCOUNTER — Encounter: Payer: Self-pay | Admitting: Hematology & Oncology

## 2014-11-11 ENCOUNTER — Ambulatory Visit (HOSPITAL_BASED_OUTPATIENT_CLINIC_OR_DEPARTMENT_OTHER): Payer: Commercial Managed Care - PPO

## 2014-11-11 ENCOUNTER — Ambulatory Visit (HOSPITAL_BASED_OUTPATIENT_CLINIC_OR_DEPARTMENT_OTHER): Payer: Commercial Managed Care - PPO | Admitting: Hematology & Oncology

## 2014-11-11 VITALS — BP 127/87 | HR 76 | Temp 97.9°F | Resp 76 | Ht 70.0 in | Wt 181.0 lb

## 2014-11-11 DIAGNOSIS — C8195 Hodgkin lymphoma, unspecified, lymph nodes of inguinal region and lower limb: Secondary | ICD-10-CM

## 2014-11-11 DIAGNOSIS — Z5112 Encounter for antineoplastic immunotherapy: Secondary | ICD-10-CM

## 2014-11-11 DIAGNOSIS — Z9484 Stem cells transplant status: Secondary | ICD-10-CM

## 2014-11-11 LAB — CMP (CANCER CENTER ONLY)
ALBUMIN: 3.7 g/dL (ref 3.3–5.5)
ALT(SGPT): 30 U/L (ref 10–47)
AST: 29 U/L (ref 11–38)
Alkaline Phosphatase: 80 U/L (ref 26–84)
BUN, Bld: 17 mg/dL (ref 7–22)
CALCIUM: 9.4 mg/dL (ref 8.0–10.3)
CO2: 29 meq/L (ref 18–33)
Chloride: 102 mEq/L (ref 98–108)
Creat: 1 mg/dl (ref 0.6–1.2)
GLUCOSE: 74 mg/dL (ref 73–118)
POTASSIUM: 4.3 meq/L (ref 3.3–4.7)
Sodium: 143 mEq/L (ref 128–145)
Total Bilirubin: 0.7 mg/dl (ref 0.20–1.60)
Total Protein: 7.1 g/dL (ref 6.4–8.1)

## 2014-11-11 LAB — CBC WITH DIFFERENTIAL (CANCER CENTER ONLY)
BASO#: 0.1 10*3/uL (ref 0.0–0.2)
BASO%: 1.2 % (ref 0.0–2.0)
EOS ABS: 0.2 10*3/uL (ref 0.0–0.5)
EOS%: 5.4 % (ref 0.0–7.0)
HEMATOCRIT: 40.2 % (ref 38.7–49.9)
HEMOGLOBIN: 13.7 g/dL (ref 13.0–17.1)
LYMPH#: 0.8 10*3/uL — ABNORMAL LOW (ref 0.9–3.3)
LYMPH%: 20.5 % (ref 14.0–48.0)
MCH: 31.9 pg (ref 28.0–33.4)
MCHC: 34.1 g/dL (ref 32.0–35.9)
MCV: 94 fL (ref 82–98)
MONO#: 0.7 10*3/uL (ref 0.1–0.9)
MONO%: 16.6 % — ABNORMAL HIGH (ref 0.0–13.0)
NEUT#: 2.3 10*3/uL (ref 1.5–6.5)
NEUT%: 56.3 % (ref 40.0–80.0)
Platelets: 214 10*3/uL (ref 145–400)
RBC: 4.3 10*6/uL (ref 4.20–5.70)
RDW: 14.3 % (ref 11.1–15.7)
WBC: 4 10*3/uL (ref 4.0–10.0)

## 2014-11-11 LAB — LACTATE DEHYDROGENASE: LDH: 159 U/L (ref 94–250)

## 2014-11-11 MED ORDER — DIPHENHYDRAMINE HCL 50 MG/ML IJ SOLN
INTRAMUSCULAR | Status: AC
  Filled 2014-11-11: qty 1

## 2014-11-11 MED ORDER — DEXAMETHASONE SODIUM PHOSPHATE 10 MG/ML IJ SOLN
10.0000 mg | Freq: Once | INTRAMUSCULAR | Status: AC
  Administered 2014-11-11: 10 mg via INTRAVENOUS

## 2014-11-11 MED ORDER — SODIUM CHLORIDE 0.9 % IV SOLN
Freq: Once | INTRAVENOUS | Status: AC
  Administered 2014-11-11: 10:00:00 via INTRAVENOUS

## 2014-11-11 MED ORDER — ONDANSETRON 8 MG/50ML IVPB (CHCC)
8.0000 mg | Freq: Once | INTRAVENOUS | Status: AC
Start: 2014-11-11 — End: 2014-11-11
  Administered 2014-11-11: 8 mg via INTRAVENOUS

## 2014-11-11 MED ORDER — HEPARIN SOD (PORK) LOCK FLUSH 100 UNIT/ML IV SOLN
500.0000 [IU] | Freq: Once | INTRAVENOUS | Status: AC | PRN
  Administered 2014-11-11: 500 [IU]
  Filled 2014-11-11: qty 5

## 2014-11-11 MED ORDER — SODIUM CHLORIDE 0.9 % IJ SOLN
10.0000 mL | INTRAMUSCULAR | Status: DC | PRN
  Administered 2014-11-11: 10 mL
  Filled 2014-11-11: qty 10

## 2014-11-11 MED ORDER — VITAMIN B-6 250 MG PO TABS: 250.0000 mg | ORAL_TABLET | Freq: Every day | ORAL | Status: DC

## 2014-11-11 MED ORDER — ACETAMINOPHEN 325 MG PO TABS
650.0000 mg | ORAL_TABLET | Freq: Once | ORAL | Status: AC
  Administered 2014-11-11: 650 mg via ORAL

## 2014-11-11 MED ORDER — BRENTUXIMAB VEDOTIN 50 MG IV SOLR
1.8000 mg/kg | Freq: Once | INTRAVENOUS | Status: AC
  Administered 2014-11-11: 145 mg via INTRAVENOUS
  Filled 2014-11-11: qty 29

## 2014-11-11 MED ORDER — DEXAMETHASONE SODIUM PHOSPHATE 10 MG/ML IJ SOLN
INTRAMUSCULAR | Status: AC
  Filled 2014-11-11: qty 1

## 2014-11-11 MED ORDER — DIPHENHYDRAMINE HCL 50 MG/ML IJ SOLN
50.0000 mg | Freq: Once | INTRAMUSCULAR | Status: AC
  Administered 2014-11-11: 50 mg via INTRAVENOUS

## 2014-11-11 MED ORDER — ACETAMINOPHEN 325 MG PO TABS
ORAL_TABLET | ORAL | Status: AC
  Filled 2014-11-11: qty 2

## 2014-11-11 NOTE — Progress Notes (Signed)
Hematology and Oncology Follow Up Visit  Brian [name] [id number] January 21, 1961 53 y.o. 11/11/2014   Principle Diagnosis:  Recurrent Hodgkin's Disease -  S/p autologous transplant   Current Therapy:    S/p Adcetris x 7 cycles     Interim History:  [name] is in for followup. He does have low bit of tingling in the fingertips. This does not bother him. He still able to do pretty much what he wishes. I told him that I thought this was probably from the Adcetris. I will put him on some vitamin B 6. I will put him on a 250 mg dose. I will not change the Adcetris dose for right now.  He is still working. He's had no problems with fatigue. There's no weakness. He is exercising.  He does have about 3 or 4 bowel movements a day. This is not diarrhea. I told him that he can try some Pepto-Bismol or some Imodium if he wishes..  There's been no cough. He's had no chest wall pain. He still has the numbness over in the left lower lateral ribs from his surgical procedure.  His overall performance status is ECOG 1  Medications: Current outpatient prescriptions: acetaminophen (TYLENOL) 325 MG tablet, Take 325 mg by mouth every 6 (six) hours as needed for mild pain., Disp: , Rfl: ;  ALPRAZolam (XANAX) 0.5 MG tablet, Take 0.5 mg by mouth daily as needed for anxiety or sleep., Disp: , Rfl: ;  Cholecalciferol (VITAMIN D-3) 1000 UNITS CAPS, Take by mouth 2 (two) times daily., Disp: , Rfl:  citalopram (CELEXA) 20 MG tablet, Take 1 tablet (20 mg total) by mouth daily., Disp: 30 tablet, Rfl: 6;  dexamethasone (DECADRON) 4 MG tablet, Take 2 tablets (8 mg total) by mouth 2 (two) times daily with a meal. Start the day after chemotherapy for 2 days. Take with food., Disp: 30 tablet, Rfl: 1;  ibuprofen (ADVIL,MOTRIN) 200 MG tablet, Take 200-400 mg by mouth every 6 (six) hours as needed for mild pain. , Disp: , Rfl:  lidocaine-prilocaine (EMLA) cream, Apply 1 application topically as needed. Apply nickel sized amount to  portacath site at least 1 hour prior to chemotherapy., Disp: 30 g, Rfl: 1;  LORazepam (ATIVAN) 0.5 MG tablet, Take 1 tablet (0.5 mg total) by mouth every 6 (six) hours as needed (Nausea or vomiting)., Disp: 30 tablet, Rfl: 0 LORazepam (ATIVAN) 1 MG tablet, TAKE 1/2 A TABLET BY MOUTH TWICE DAILY DURING THE DAY FOR ANXIETY, AND 1 TABLET AT BEDTIME IF NEEDED, Disp: 20 tablet, Rfl: 3;  Multiple Vitamin (MULTIVITAMIN) tablet, Take 1 tablet by mouth daily., Disp: , Rfl: ;  ondansetron (ZOFRAN) 8 MG tablet, Take 1 tablet (8 mg total) by mouth 2 (two) times daily. Start the day after chemo for 2 days. Then take as needed for nausea or vomiting., Disp: 30 tablet, Rfl: 1 lidocaine (LIDODERM) 5 %, Place 1 patch onto the skin daily. Remove & Discard patch within 12 hours or as directed by MD (Patient not taking: Reported on 10/22/2014), Disp: 30 patch, Rfl: 3;  prochlorperazine (COMPAZINE) 10 MG tablet, Take 1 tablet (10 mg total) by mouth every 6 (six) hours as needed (Nausea or vomiting). (Patient not taking: Reported on 11/11/2014), Disp: 30 tablet, Rfl: 1 Pyridoxine HCl (VITAMIN B-6) 250 MG tablet, Take 1 tablet (250 mg total) by mouth daily., Disp: 30 tablet, Rfl: 6 No current facility-administered medications for this visit. Facility-Administered Medications Ordered in Other Visits: 0.9 %  sodium chloride infusion, ,  Intravenous, Once, [name], MD;  acetaminophen (TYLENOL) tablet 650 mg, 650 mg, Oral, Once, [name], MD;  brentuximab vedotin (ADCETRIS) 145 mg in sodium chloride 0.9 % 100 mL chemo infusion, 1.8 mg/kg (Treatment Plan Actual), Intravenous, Once, [name], MD dexamethasone (DECADRON) injection 10 mg, 10 mg, Intravenous, Once, [name], MD;  diphenhydrAMINE (BENADRYL) injection 50 mg, 50 mg, Intravenous, Once, [name], MD;  heparin lock flush 100 unit/mL, 500 Units, Intracatheter, Once PRN, [name], MD;  ondansetron (ZOFRAN) IVPB 8 mg, 8 mg, Intravenous, Once,  [name], MD sodium chloride 0.9 % injection 10 mL, 10 mL, Intracatheter, PRN, [name], MD, 10 mL at 06/17/14 1355;  sodium chloride 0.9 % injection 10 mL, 10 mL, Intracatheter, PRN, [name], MD  Allergies: No Known Allergies  Past Medical History, Surgical history, Social history, and Family History were reviewed and updated.  Review of Systems: As above  Physical Exam:  height is 5\' 10"  (1.778 m) and weight is 181 lb (82.101 kg). His oral temperature is 97.9 F (36.6 C). His blood pressure is 127/87 and his pulse is 76. His respiration is 76.   Well-developed and well-nourished white gentleman. Head and neck exam shows no ocular or oral lesions. There are no palpable cervical or supraclavicular lymph nodes. Lungs are clear. Cardiac exam regular rate and rhythm with no murmurs, rubs or bruits.. Abdomen is soft. He has good bowel sounds. There is no fluid wave. There is no palpable liver or spleen tip. Back exam shows a thoracoscopy site in the left lateral chest wall. This is healed. There is no swelling or erythema. Extremities shows no clubbing, cyanosis or edema. Skin exam shows no rashes, ecchymoses or petechia . Neurological exam is nonfocal.  Lab Results  Component Value Date   WBC 4.0 11/11/2014   HGB 13.7 11/11/2014   HCT 40.2 11/11/2014   MCV 94 11/11/2014   PLT 214 11/11/2014     Chemistry      Component Value Date/Time   NA 143 11/11/2014 0834   NA 141 06/02/2014 0335   NA 142 03/25/2013 0828   K 4.3 11/11/2014 0834   K 4.0 06/02/2014 0335   K 4.1 03/25/2013 0828   CL 102 11/11/2014 0834   CL 101 06/02/2014 0335   CL 107 03/25/2013 0828   CO2 29 11/11/2014 0834   CO2 27 06/02/2014 0335   CO2 27 03/25/2013 0828   BUN 17 11/11/2014 0834   BUN 16 06/02/2014 0335   BUN 12.7 03/25/2013 0828   CREATININE 1.0 11/11/2014 0834   CREATININE 1.00 06/02/2014 0335   CREATININE 0.9 03/25/2013 0828      Component Value Date/Time   CALCIUM 9.4  11/11/2014 0834   CALCIUM 9.3 06/02/2014 0335   CALCIUM 9.2 03/25/2013 0828   ALKPHOS 80 11/11/2014 0834   ALKPHOS 71 06/02/2014 0335   ALKPHOS 122 03/25/2013 0828   AST 29 11/11/2014 0834   AST 24 06/02/2014 0335   AST 33 03/25/2013 0828   ALT 30 11/11/2014 0834   ALT 20 06/02/2014 0335   ALT 65* 03/25/2013 0828   BILITOT 0.70 11/11/2014 0834   BILITOT 0.5 06/02/2014 0335   BILITOT 0.31 03/25/2013 0828         Impression and Plan: Brian [name] is a 53 year old gentleman.  He has history of recurrent Hodgkin's disease. 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 He then had a  recurrence after this.  We were thinking of trying to do an allogeneic bone marrow transplant However, he does not want to go through this. He realizes that there are a lot of side effects with an allogeneic transplant.  We will continue the Adcetris. I think he is responding well to this. I cannot find anything clinically that would suggest recurrence.  We'll probably will plan for a PET scan in January. I'll see him back in 3 weeks. We will do a PET scan at his next cycle.  I hope that the vitamin B 6 will help him.   I spent about 40 minutes with he and his wife.Marland Kitchen   [name], MD 12/17/201510:15 AM

## 2014-11-11 NOTE — Patient Instructions (Signed)
Brentuximab vedotin solution for injection What is this medicine? BRENTUXIMAB VEDOTIN (bren TUX see mab ve DOE tin) is a chemotherapy drug. It targets a specific protein within cancer cells and stops the cancer cells from growing. This medicine is used for treating Hodgkin's lymphoma and certain non-Hodgkin's lymphomas, such as systemic anaplastic large cell lymphoma. This medicine may be used for other purposes; ask your health care provider or pharmacist if you have questions. COMMON BRAND NAME(S): ADCETRIS What should I tell my health care provider before I take this medicine? They need to know if you have any of these conditions: -immune system problems -infection (especially a virus infection such as chickenpox, cold sores, or herpes) -kidney disease -liver disease -low blood counts, like low white cell, platelet, or red cell counts -tingling of the fingers or toes, or other nerve disorder -an unusual or allergic reaction to brentuximab vedotin, other medicines, foods, dyes, or preservatives -pregnant or trying to get pregnant -breast-feeding How should I use this medicine? This medicine is for infusion into a vein. It is given by a health care professional in a hospital or clinic setting. Talk to your pediatrician regarding the use of this medicine in children. Special care may be needed. Overdosage: If you think you've taken too much of this medicine contact a poison control center or emergency room at once. Overdosage: If you think you have taken too much of this medicine contact a poison control center or emergency room at once. NOTE: This medicine is only for you. Do not share this medicine with others. What if I miss a dose? It is important not to miss your dose. Call your doctor or health care professional if you are unable to keep an appointment. What may interact with this medicine? This medicine may interact with the following medications: -ketoconazole -rifampin -St.  John's wort; Hypericum perforatum This list may not describe all possible interactions. Give your health care provider a list of all the medicines, herbs, non-prescription drugs, or dietary supplements you use. Also tell them if you smoke, drink alcohol, or use illegal drugs. Some items may interact with your medicine. What should I watch for while using this medicine? Visit your doctor for checks on your progress. This drug may make you feel generally unwell. This is not uncommon, as chemotherapy can affect healthy cells as well as cancer cells. Report any side effects. Continue your course of treatment even though you feel ill unless your doctor tells you to stop. Call your doctor or health care professional for advice if you get a fever, chills or sore throat, or other symptoms of a cold or flu. Do not treat yourself. This drug decreases your body's ability to fight infections. Try to avoid being around people who are sick. This medicine may increase your risk to bruise or bleed. Call your doctor or health care professional if you notice any unusual bleeding. In some patients, this medicine may cause a serious brain infection that may cause death. If you have any problems seeing, thinking, speaking, walking, or standing, tell your doctor right away. If you cannot reach your doctor, urgently seek other source of medical care. Do not become pregnant while taking this medicine. Women should inform their doctor if they wish to become pregnant or think they might be pregnant. There is a potential for serious side effects to an unborn child. Talk to your health care professional or pharmacist for more information. Do not breast-feed an infant while taking this medicine. What side effects   may I notice from receiving this medicine? Side effects that you should report to your doctor or health care professional as soon as possible: -allergic reactions like skin rash, itching or hives, swelling of the face, lips,  or tongue -changes in emotions or moods -low blood counts - this medicine may decrease the number of white blood cells, red blood cells and platelets. You may be at increased risk for infections and bleeding. -pain, tingling, numbness in the hands or feet -redness, blistering, peeling or loosening of the skin, including inside the mouth -shortness of breath -signs of infection - fever or chills, cough, sore throat, pain or difficulty passing urine -signs of decreased platelets or bleeding - bruising, pinpoint red spots on the skin, black, tarry stools, blood in the urine -signs of decreased red blood cells - unusually weak or tired, fainting spells, lightheadedness -signs of liver injury like dark yellow or brown urine; general ill feeling or flu-like symptoms; light-colored stools; loss of appetite; nausea; right upper belly pain; yellowing of the eyes or skin -stomach pain -sudden numbness or weakness of the face, arm or leg Side effects that usually do not require medical attention (Report these to your doctor or health care professional if they continue or are bothersome): -diarrhea -dizziness -headache -muscle pain -nausea, vomiting -tiredness This list may not describe all possible side effects. Call your doctor for medical advice about side effects. You may report side effects to FDA at 1-800-FDA-1088. Where should I keep my medicine? This drug is given in a hospital or clinic and will not be stored at home. NOTE: This sheet is a summary. It may not cover all possible information. If you have questions about this medicine, talk to your doctor, pharmacist, or health care provider.  2015, [employer]. (2014-02-24 16:52:15)  

## 2014-11-30 ENCOUNTER — Other Ambulatory Visit: Payer: Self-pay | Admitting: *Deleted

## 2014-11-30 DIAGNOSIS — C8195 Hodgkin lymphoma, unspecified, lymph nodes of inguinal region and lower limb: Secondary | ICD-10-CM

## 2014-11-30 MED ORDER — DEXAMETHASONE 4 MG PO TABS: 8.0000 mg | ORAL_TABLET | Freq: Two times a day (BID) | ORAL | Status: DC

## 2014-12-02 ENCOUNTER — Other Ambulatory Visit (HOSPITAL_BASED_OUTPATIENT_CLINIC_OR_DEPARTMENT_OTHER): Payer: Commercial Managed Care - PPO | Admitting: Lab

## 2014-12-02 ENCOUNTER — Telehealth: Payer: Self-pay | Admitting: Hematology & Oncology

## 2014-12-02 ENCOUNTER — Ambulatory Visit (HOSPITAL_BASED_OUTPATIENT_CLINIC_OR_DEPARTMENT_OTHER): Payer: Commercial Managed Care - PPO | Admitting: Hematology & Oncology

## 2014-12-02 ENCOUNTER — Ambulatory Visit (HOSPITAL_BASED_OUTPATIENT_CLINIC_OR_DEPARTMENT_OTHER): Payer: Commercial Managed Care - PPO

## 2014-12-02 ENCOUNTER — Other Ambulatory Visit: Payer: Self-pay | Admitting: *Deleted

## 2014-12-02 ENCOUNTER — Encounter: Payer: Self-pay | Admitting: Hematology & Oncology

## 2014-12-02 VITALS — BP 115/69 | HR 96 | Temp 98.5°F | Resp 18 | Ht 70.0 in | Wt 179.0 lb

## 2014-12-02 DIAGNOSIS — C8195 Hodgkin lymphoma, unspecified, lymph nodes of inguinal region and lower limb: Secondary | ICD-10-CM

## 2014-12-02 DIAGNOSIS — C8190 Hodgkin lymphoma, unspecified, unspecified site: Secondary | ICD-10-CM

## 2014-12-02 DIAGNOSIS — Z5112 Encounter for antineoplastic immunotherapy: Secondary | ICD-10-CM

## 2014-12-02 DIAGNOSIS — Z9484 Stem cells transplant status: Secondary | ICD-10-CM

## 2014-12-02 LAB — CBC WITH DIFFERENTIAL (CANCER CENTER ONLY)
BASO#: 0 10*3/uL (ref 0.0–0.2)
BASO%: 0.9 % (ref 0.0–2.0)
EOS ABS: 0.3 10*3/uL (ref 0.0–0.5)
EOS%: 6.2 % (ref 0.0–7.0)
HCT: 39.9 % (ref 38.7–49.9)
HEMOGLOBIN: 13.4 g/dL (ref 13.0–17.1)
LYMPH#: 0.4 10*3/uL — ABNORMAL LOW (ref 0.9–3.3)
LYMPH%: 9.8 % — ABNORMAL LOW (ref 14.0–48.0)
MCH: 32.1 pg (ref 28.0–33.4)
MCHC: 33.6 g/dL (ref 32.0–35.9)
MCV: 96 fL (ref 82–98)
MONO#: 1.1 10*3/uL — ABNORMAL HIGH (ref 0.1–0.9)
MONO%: 24 % — ABNORMAL HIGH (ref 0.0–13.0)
NEUT%: 59.1 % (ref 40.0–80.0)
NEUTROS ABS: 2.6 10*3/uL (ref 1.5–6.5)
Platelets: 191 10*3/uL (ref 145–400)
RBC: 4.17 10*6/uL — AB (ref 4.20–5.70)
RDW: 14.6 % (ref 11.1–15.7)
WBC: 4.4 10*3/uL (ref 4.0–10.0)

## 2014-12-02 LAB — CMP (CANCER CENTER ONLY)
ALK PHOS: 70 U/L (ref 26–84)
ALT: 27 U/L (ref 10–47)
AST: 29 U/L (ref 11–38)
Albumin: 3.8 g/dL (ref 3.3–5.5)
BILIRUBIN TOTAL: 0.8 mg/dL (ref 0.20–1.60)
BUN, Bld: 17 mg/dL (ref 7–22)
CO2: 28 meq/L (ref 18–33)
Calcium: 9.1 mg/dL (ref 8.0–10.3)
Chloride: 100 mEq/L (ref 98–108)
Creat: 1.1 mg/dl (ref 0.6–1.2)
GLUCOSE: 70 mg/dL — AB (ref 73–118)
Potassium: 3.9 mEq/L (ref 3.3–4.7)
Sodium: 140 mEq/L (ref 128–145)
TOTAL PROTEIN: 7 g/dL (ref 6.4–8.1)

## 2014-12-02 LAB — LACTATE DEHYDROGENASE: LDH: 158 U/L (ref 94–250)

## 2014-12-02 MED ORDER — HEPARIN SOD (PORK) LOCK FLUSH 100 UNIT/ML IV SOLN
500.0000 [IU] | Freq: Once | INTRAVENOUS | Status: AC | PRN
  Administered 2014-12-02: 500 [IU]
  Filled 2014-12-02: qty 5

## 2014-12-02 MED ORDER — ONDANSETRON 8 MG/50ML IVPB (CHCC)
8.0000 mg | Freq: Once | INTRAVENOUS | Status: AC
  Administered 2014-12-02: 8 mg via INTRAVENOUS

## 2014-12-02 MED ORDER — DEXAMETHASONE SODIUM PHOSPHATE 10 MG/ML IJ SOLN
10.0000 mg | Freq: Once | INTRAMUSCULAR | Status: AC
  Administered 2014-12-02: 10 mg via INTRAVENOUS

## 2014-12-02 MED ORDER — SODIUM CHLORIDE 0.9 % IJ SOLN
10.0000 mL | INTRAMUSCULAR | Status: DC | PRN
  Administered 2014-12-02: 10 mL
  Filled 2014-12-02: qty 10

## 2014-12-02 MED ORDER — DIPHENHYDRAMINE HCL 50 MG/ML IJ SOLN
INTRAMUSCULAR | Status: AC
  Filled 2014-12-02: qty 1

## 2014-12-02 MED ORDER — ACETAMINOPHEN 325 MG PO TABS
ORAL_TABLET | ORAL | Status: AC
  Filled 2014-12-02: qty 2

## 2014-12-02 MED ORDER — ACETAMINOPHEN 325 MG PO TABS
650.0000 mg | ORAL_TABLET | Freq: Once | ORAL | Status: AC
  Administered 2014-12-02: 650 mg via ORAL

## 2014-12-02 MED ORDER — DEXAMETHASONE SODIUM PHOSPHATE 10 MG/ML IJ SOLN
INTRAMUSCULAR | Status: AC
  Filled 2014-12-02: qty 1

## 2014-12-02 MED ORDER — SODIUM CHLORIDE 0.9 % IV SOLN
1.8000 mg/kg | Freq: Once | INTRAVENOUS | Status: AC
  Administered 2014-12-02: 145 mg via INTRAVENOUS
  Filled 2014-12-02: qty 29

## 2014-12-02 MED ORDER — SODIUM CHLORIDE 0.9 % IV SOLN
Freq: Once | INTRAVENOUS | Status: AC
  Administered 2014-12-02: 12:00:00 via INTRAVENOUS

## 2014-12-02 MED ORDER — DIPHENHYDRAMINE HCL 50 MG/ML IJ SOLN
50.0000 mg | Freq: Once | INTRAMUSCULAR | Status: AC
Start: 2014-12-02 — End: 2014-12-02
  Administered 2014-12-02: 50 mg via INTRAVENOUS

## 2014-12-02 NOTE — Patient Instructions (Signed)
Brentuximab vedotin solution for injection What is this medicine? BRENTUXIMAB VEDOTIN (bren TUX see mab ve DOE tin) is a chemotherapy drug. It targets a specific protein within cancer cells and stops the cancer cells from growing. This medicine is used for treating Hodgkin's lymphoma and certain non-Hodgkin's lymphomas, such as systemic anaplastic large cell lymphoma. This medicine may be used for other purposes; ask your health care provider or pharmacist if you have questions. COMMON BRAND NAME(S): ADCETRIS What should I tell my health care provider before I take this medicine? They need to know if you have any of these conditions: -immune system problems -infection (especially a virus infection such as chickenpox, cold sores, or herpes) -kidney disease -liver disease -low blood counts, like low white cell, platelet, or red cell counts -tingling of the fingers or toes, or other nerve disorder -an unusual or allergic reaction to brentuximab vedotin, other medicines, foods, dyes, or preservatives -pregnant or trying to get pregnant -breast-feeding How should I use this medicine? This medicine is for infusion into a vein. It is given by a health care professional in a hospital or clinic setting. Talk to your pediatrician regarding the use of this medicine in children. Special care may be needed. Overdosage: If you think you've taken too much of this medicine contact a poison control center or emergency room at once. Overdosage: If you think you have taken too much of this medicine contact a poison control center or emergency room at once. NOTE: This medicine is only for you. Do not share this medicine with others. What if I miss a dose? It is important not to miss your dose. Call your doctor or health care professional if you are unable to keep an appointment. What may interact with this medicine? This medicine may interact with the following medications: -ketoconazole -rifampin -St.  John's wort; Hypericum perforatum This list may not describe all possible interactions. Give your health care provider a list of all the medicines, herbs, non-prescription drugs, or dietary supplements you use. Also tell them if you smoke, drink alcohol, or use illegal drugs. Some items may interact with your medicine. What should I watch for while using this medicine? Visit your doctor for checks on your progress. This drug may make you feel generally unwell. This is not uncommon, as chemotherapy can affect healthy cells as well as cancer cells. Report any side effects. Continue your course of treatment even though you feel ill unless your doctor tells you to stop. Call your doctor or health care professional for advice if you get a fever, chills or sore throat, or other symptoms of a cold or flu. Do not treat yourself. This drug decreases your body's ability to fight infections. Try to avoid being around people who are sick. This medicine may increase your risk to bruise or bleed. Call your doctor or health care professional if you notice any unusual bleeding. In some patients, this medicine may cause a serious brain infection that may cause death. If you have any problems seeing, thinking, speaking, walking, or standing, tell your doctor right away. If you cannot reach your doctor, urgently seek other source of medical care. Do not become pregnant while taking this medicine. Women should inform their doctor if they wish to become pregnant or think they might be pregnant. There is a potential for serious side effects to an unborn child. Talk to your health care professional or pharmacist for more information. Do not breast-feed an infant while taking this medicine. What side effects   may I notice from receiving this medicine? Side effects that you should report to your doctor or health care professional as soon as possible: -allergic reactions like skin rash, itching or hives, swelling of the face, lips,  or tongue -changes in emotions or moods -low blood counts - this medicine may decrease the number of white blood cells, red blood cells and platelets. You may be at increased risk for infections and bleeding. -pain, tingling, numbness in the hands or feet -redness, blistering, peeling or loosening of the skin, including inside the mouth -shortness of breath -signs of infection - fever or chills, cough, sore throat, pain or difficulty passing urine -signs of decreased platelets or bleeding - bruising, pinpoint red spots on the skin, black, tarry stools, blood in the urine -signs of decreased red blood cells - unusually weak or tired, fainting spells, lightheadedness -signs of liver injury like dark yellow or brown urine; general ill feeling or flu-like symptoms; light-colored stools; loss of appetite; nausea; right upper belly pain; yellowing of the eyes or skin -stomach pain -sudden numbness or weakness of the face, arm or leg Side effects that usually do not require medical attention (Report these to your doctor or health care professional if they continue or are bothersome): -diarrhea -dizziness -headache -muscle pain -nausea, vomiting -tiredness This list may not describe all possible side effects. Call your doctor for medical advice about side effects. You may report side effects to FDA at 1-800-FDA-1088. Where should I keep my medicine? This drug is given in a hospital or clinic and will not be stored at home. NOTE: This sheet is a summary. It may not cover all possible information. If you have questions about this medicine, talk to your doctor, pharmacist, or health care provider.  2015, [employer]. (2014-02-24 16:52:15)  

## 2014-12-02 NOTE — Telephone Encounter (Signed)
[place]  [facility]  Fx: [phone]  Ph: [phone] ext [phone]  PATIENT: Brian [name]  AUTH: [phone]  VALID: 11/11/2014 - 04/26/2015 CHEMO: [id number] Adcetris 135mg  chemo infusion

## 2014-12-03 ENCOUNTER — Telehealth: Payer: Self-pay | Admitting: Hematology & Oncology

## 2014-12-03 NOTE — Telephone Encounter (Signed)
Pt aware of 1-26 PET and to be NPO 6 hrs

## 2014-12-03 NOTE — Progress Notes (Signed)
Hematology and Oncology Follow Up Visit  Brian [name] [id number] 31-Oct-1961 54 y.o. 12/03/2014   Principle Diagnosis:  Recurrent Hodgkin's Disease -  S/p autologous transplant   Current Therapy:    S/p Adcetris x 8 cycles     Interim History:  [name] is in for followup. He does have low bit of tingling in the fingertips. This does not bother him. It has not been any worse since his last cycle. He still able to do pretty much what he wishes. I told him that I thought this was probably from the Adcetris. I did put him on some vitamin B 6. This might be helping a little bit.  He and his family had a nice Christmas and New Year's. They state home. He ate pretty well.  He is still working. He's had no problems with fatigue. There's no weakness. He is exercising.  He does have about 3 or 4 bowel movements a day. This is not diarrhea. I told him that he can try some Pepto-Bismol or some Imodium if he wishes..  There's been no cough. He's had no chest wall pain. He still has the numbness over in the left lower lateral ribs from his surgical procedure. He has occasional swelling in this area whenever he tries to exercise. His overall performance status is ECOG 1  Medications: Current outpatient prescriptions: acetaminophen (TYLENOL) 325 MG tablet, Take 325 mg by mouth every 6 (six) hours as needed for mild pain., Disp: , Rfl: ;  ALPRAZolam (XANAX) 0.5 MG tablet, Take 0.5 mg by mouth daily as needed for anxiety or sleep., Disp: , Rfl: ;  Cholecalciferol (VITAMIN D-3) 1000 UNITS CAPS, Take by mouth 2 (two) times daily., Disp: , Rfl:  citalopram (CELEXA) 20 MG tablet, Take 1 tablet (20 mg total) by mouth daily., Disp: 30 tablet, Rfl: 6;  ibuprofen (ADVIL,MOTRIN) 200 MG tablet, Take 200-400 mg by mouth every 6 (six) hours as needed for mild pain. , Disp: , Rfl: ;  lidocaine-prilocaine (EMLA) cream, Apply 1 application topically as needed. Apply nickel sized amount to portacath site at least 1 hour  prior to chemotherapy., Disp: 30 g, Rfl: 1 LORazepam (ATIVAN) 1 MG tablet, TAKE 1/2 A TABLET BY MOUTH TWICE DAILY DURING THE DAY FOR ANXIETY, AND 1 TABLET AT BEDTIME IF NEEDED, Disp: 20 tablet, Rfl: 3;  Multiple Vitamin (MULTIVITAMIN) tablet, Take 1 tablet by mouth daily., Disp: , Rfl: ;  ondansetron (ZOFRAN) 8 MG tablet, Take 1 tablet (8 mg total) by mouth 2 (two) times daily. Start the day after chemo for 2 days. Then take as needed for nausea or vomiting., Disp: 30 tablet, Rfl: 1 Pyridoxine HCl (VITAMIN B-6) 250 MG tablet, Take 1 tablet (250 mg total) by mouth daily., Disp: 30 tablet, Rfl: 6;  dexamethasone (DECADRON) 4 MG tablet, Take 2 tablets (8 mg total) by mouth 2 (two) times daily with a meal. Start the day after chemotherapy for 2 days. Take with food., Disp: 30 tablet, Rfl: 1 lidocaine (LIDODERM) 5 %, Place 1 patch onto the skin daily. Remove & Discard patch within 12 hours or as directed by MD (Patient not taking: Reported on 10/22/2014), Disp: 30 patch, Rfl: 3;  prochlorperazine (COMPAZINE) 10 MG tablet, Take 1 tablet (10 mg total) by mouth every 6 (six) hours as needed (Nausea or vomiting). (Patient not taking: Reported on 11/11/2014), Disp: 30 tablet, Rfl: 1 No current facility-administered medications for this visit. Facility-Administered Medications Ordered in Other Visits: sodium chloride 0.9 % injection  10 mL, 10 mL, Intracatheter, PRN, [name], MD, 10 mL at 06/17/14 1355  Allergies: No Known Allergies  Past Medical History, Surgical history, Social history, and Family History were reviewed and updated.  Review of Systems: As above  Physical Exam:  height is 5\' 10"  (1.778 m) and weight is 179 lb (81.194 kg). His oral temperature is 98.5 F (36.9 C). His blood pressure is 115/69 and his pulse is 96. His respiration is 18.   Well-developed and well-nourished white gentleman. Head and neck exam shows no ocular or oral lesions. There are no palpable cervical or  supraclavicular lymph nodes. Lungs are clear. Cardiac exam regular rate and rhythm with no murmurs, rubs or bruits.. Abdomen is soft. He has good bowel sounds. There is no fluid wave. There is no palpable liver or spleen tip. Back exam shows a thoracoscopy site in the left lateral chest wall. This is healed. There is no swelling or erythema. Extremities shows no clubbing, cyanosis or edema. Skin exam shows no rashes, ecchymoses or petechia . Neurological exam is nonfocal.  Lab Results  Component Value Date   WBC 4.4 12/02/2014   HGB 13.4 12/02/2014   HCT 39.9 12/02/2014   MCV 96 12/02/2014   PLT 191 12/02/2014     Chemistry      Component Value Date/Time   NA 140 12/02/2014 0956   NA 141 06/02/2014 0335   NA 142 03/25/2013 0828   K 3.9 12/02/2014 0956   K 4.0 06/02/2014 0335   K 4.1 03/25/2013 0828   CL 100 12/02/2014 0956   CL 101 06/02/2014 0335   CL 107 03/25/2013 0828   CO2 28 12/02/2014 0956   CO2 27 06/02/2014 0335   CO2 27 03/25/2013 0828   BUN 17 12/02/2014 0956   BUN 16 06/02/2014 0335   BUN 12.7 03/25/2013 0828   CREATININE 1.1 12/02/2014 0956   CREATININE 1.00 06/02/2014 0335   CREATININE 0.9 03/25/2013 0828      Component Value Date/Time   CALCIUM 9.1 12/02/2014 0956   CALCIUM 9.3 06/02/2014 0335   CALCIUM 9.2 03/25/2013 0828   ALKPHOS 70 12/02/2014 0956   ALKPHOS 71 06/02/2014 0335   ALKPHOS 122 03/25/2013 0828   AST 29 12/02/2014 0956   AST 24 06/02/2014 0335   AST 33 03/25/2013 0828   ALT 27 12/02/2014 0956   ALT 20 06/02/2014 0335   ALT 65* 03/25/2013 0828   BILITOT 0.80 12/02/2014 0956   BILITOT 0.5 06/02/2014 0335   BILITOT 0.31 03/25/2013 0828         Impression and Plan: Brian [name] is a 54 year old gentleman.  He has history of recurrent Hodgkin's disease. 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 He then had a recurrence after this.  We were thinking of trying to do an allogeneic bone marrow transplant However, he  does not want to go through this. He realizes that there are a lot of side effects with an allogeneic transplant.  We will continue the Adcetris. I think he is responding well to this. I cannot find anything clinically that would suggest recurrence.  We'll probably will plan for a PET scan in 2 weeks I'll see him back in 3 weeks. We will do a PET scan before his next cycle.  I hope that the vitamin B 6 will help him.   I spent about 40 minutes with he and his wife.Marland Kitchen   [name], MD 1/8/20167:36 AM

## 2014-12-21 ENCOUNTER — Encounter (HOSPITAL_COMMUNITY)
Admission: RE | Admit: 2014-12-21 | Discharge: 2014-12-21 | Disposition: A | Discharge status: Home / Self Care | Payer: Commercial Managed Care - PPO | Source: Ambulatory Visit | Attending: Hematology & Oncology | Admitting: Hematology & Oncology

## 2014-12-21 DIAGNOSIS — C8195 Hodgkin lymphoma, unspecified, lymph nodes of inguinal region and lower limb: Secondary | ICD-10-CM | POA: Diagnosis not present

## 2014-12-21 LAB — GLUCOSE, CAPILLARY: Glucose-Capillary: 99 mg/dL (ref 70–99)

## 2014-12-21 MED ORDER — FLUDEOXYGLUCOSE F - 18 (FDG) INJECTION
8.6000 | Freq: Once | INTRAVENOUS | Status: AC | PRN
  Administered 2014-12-21: 8.6 via INTRAVENOUS

## 2014-12-23 ENCOUNTER — Ambulatory Visit (HOSPITAL_BASED_OUTPATIENT_CLINIC_OR_DEPARTMENT_OTHER): Payer: Commercial Managed Care - PPO

## 2014-12-23 ENCOUNTER — Ambulatory Visit: Payer: Commercial Managed Care - PPO

## 2014-12-23 ENCOUNTER — Other Ambulatory Visit: Payer: Commercial Managed Care - PPO | Admitting: Lab

## 2014-12-23 ENCOUNTER — Ambulatory Visit (HOSPITAL_BASED_OUTPATIENT_CLINIC_OR_DEPARTMENT_OTHER): Payer: Commercial Managed Care - PPO | Admitting: Hematology & Oncology

## 2014-12-23 ENCOUNTER — Other Ambulatory Visit (HOSPITAL_BASED_OUTPATIENT_CLINIC_OR_DEPARTMENT_OTHER): Payer: Commercial Managed Care - PPO | Admitting: Lab

## 2014-12-23 ENCOUNTER — Ambulatory Visit: Payer: Commercial Managed Care - PPO | Admitting: Hematology & Oncology

## 2014-12-23 VITALS — BP 121/74 | HR 68 | Temp 98.0°F | Resp 18 | Ht 70.0 in | Wt 183.0 lb

## 2014-12-23 DIAGNOSIS — Z5112 Encounter for antineoplastic immunotherapy: Secondary | ICD-10-CM

## 2014-12-23 DIAGNOSIS — R634 Abnormal weight loss: Secondary | ICD-10-CM

## 2014-12-23 DIAGNOSIS — E291 Testicular hypofunction: Secondary | ICD-10-CM

## 2014-12-23 DIAGNOSIS — R1013 Epigastric pain: Secondary | ICD-10-CM

## 2014-12-23 DIAGNOSIS — E349 Endocrine disorder, unspecified: Secondary | ICD-10-CM

## 2014-12-23 DIAGNOSIS — C8195 Hodgkin lymphoma, unspecified, lymph nodes of inguinal region and lower limb: Secondary | ICD-10-CM

## 2014-12-23 LAB — CMP (CANCER CENTER ONLY)
ALK PHOS: 73 U/L (ref 26–84)
ALT: 28 U/L (ref 10–47)
AST: 25 U/L (ref 11–38)
Albumin: 3.7 g/dL (ref 3.3–5.5)
BUN, Bld: 22 mg/dL (ref 7–22)
CHLORIDE: 104 meq/L (ref 98–108)
CO2: 25 mEq/L (ref 18–33)
Calcium: 9.1 mg/dL (ref 8.0–10.3)
Creat: 0.8 mg/dl (ref 0.6–1.2)
Glucose, Bld: 105 mg/dL (ref 73–118)
Potassium: 4.4 mEq/L (ref 3.3–4.7)
SODIUM: 143 meq/L (ref 128–145)
TOTAL PROTEIN: 6.8 g/dL (ref 6.4–8.1)
Total Bilirubin: 0.6 mg/dl (ref 0.20–1.60)

## 2014-12-23 LAB — CBC WITH DIFFERENTIAL (CANCER CENTER ONLY)
BASO#: 0 10*3/uL (ref 0.0–0.2)
BASO%: 0.7 % (ref 0.0–2.0)
EOS ABS: 0.2 10*3/uL (ref 0.0–0.5)
EOS%: 4.7 % (ref 0.0–7.0)
HEMATOCRIT: 38 % — AB (ref 38.7–49.9)
HGB: 12.8 g/dL — ABNORMAL LOW (ref 13.0–17.1)
LYMPH#: 0.7 10*3/uL — ABNORMAL LOW (ref 0.9–3.3)
LYMPH%: 16.6 % (ref 14.0–48.0)
MCH: 32.2 pg (ref 28.0–33.4)
MCHC: 33.7 g/dL (ref 32.0–35.9)
MCV: 96 fL (ref 82–98)
MONO#: 0.6 10*3/uL (ref 0.1–0.9)
MONO%: 13.7 % — ABNORMAL HIGH (ref 0.0–13.0)
NEUT#: 2.9 10*3/uL (ref 1.5–6.5)
NEUT%: 64.3 % (ref 40.0–80.0)
Platelets: 224 10*3/uL (ref 145–400)
RBC: 3.97 10*6/uL — ABNORMAL LOW (ref 4.20–5.70)
RDW: 14.9 % (ref 11.1–15.7)
WBC: 4.5 10*3/uL (ref 4.0–10.0)

## 2014-12-23 LAB — TESTOSTERONE: Testosterone: 233 ng/dL — ABNORMAL LOW (ref 300–890)

## 2014-12-23 MED ORDER — ACETAMINOPHEN 325 MG PO TABS
650.0000 mg | ORAL_TABLET | Freq: Once | ORAL | Status: AC
  Administered 2014-12-23: 650 mg via ORAL

## 2014-12-23 MED ORDER — DEXAMETHASONE SODIUM PHOSPHATE 10 MG/ML IJ SOLN
10.0000 mg | Freq: Once | INTRAMUSCULAR | Status: AC
  Administered 2014-12-23: 10 mg via INTRAVENOUS

## 2014-12-23 MED ORDER — ONDANSETRON 8 MG/50ML IVPB (CHCC)
8.0000 mg | Freq: Once | INTRAVENOUS | Status: AC
  Administered 2014-12-23: 8 mg via INTRAVENOUS

## 2014-12-23 MED ORDER — DIPHENHYDRAMINE HCL 50 MG/ML IJ SOLN
INTRAMUSCULAR | Status: AC
  Filled 2014-12-23: qty 1

## 2014-12-23 MED ORDER — DIPHENHYDRAMINE HCL 50 MG/ML IJ SOLN
50.0000 mg | Freq: Once | INTRAMUSCULAR | Status: AC
  Administered 2014-12-23: 50 mg via INTRAVENOUS

## 2014-12-23 MED ORDER — HEPARIN SOD (PORK) LOCK FLUSH 100 UNIT/ML IV SOLN
500.0000 [IU] | Freq: Once | INTRAVENOUS | Status: AC | PRN
  Administered 2014-12-23: 500 [IU]
  Filled 2014-12-23: qty 5

## 2014-12-23 MED ORDER — DEXAMETHASONE SODIUM PHOSPHATE 10 MG/ML IJ SOLN
INTRAMUSCULAR | Status: AC
  Filled 2014-12-23: qty 1

## 2014-12-23 MED ORDER — ACETAMINOPHEN 325 MG PO TABS
ORAL_TABLET | ORAL | Status: AC
  Filled 2014-12-23: qty 2

## 2014-12-23 MED ORDER — SODIUM CHLORIDE 0.9 % IV SOLN
Freq: Once | INTRAVENOUS | Status: AC
  Administered 2014-12-23: 12:00:00 via INTRAVENOUS

## 2014-12-23 MED ORDER — SODIUM CHLORIDE 0.9 % IV SOLN
1.6200 mg/kg | Freq: Once | INTRAVENOUS | Status: AC
  Administered 2014-12-23: 130 mg via INTRAVENOUS
  Filled 2014-12-23: qty 26

## 2014-12-23 MED ORDER — SODIUM CHLORIDE 0.9 % IJ SOLN
10.0000 mL | INTRAMUSCULAR | Status: DC | PRN
  Administered 2014-12-23: 10 mL
  Filled 2014-12-23: qty 10

## 2014-12-23 NOTE — Patient Instructions (Signed)
Brentuximab vedotin solution for injection What is this medicine? BRENTUXIMAB VEDOTIN (bren TUX see mab ve DOE tin) is a chemotherapy drug. It targets a specific protein within cancer cells and stops the cancer cells from growing. This medicine is used for treating Hodgkin's lymphoma and certain non-Hodgkin's lymphomas, such as systemic anaplastic large cell lymphoma. This medicine may be used for other purposes; ask your health care provider or pharmacist if you have questions. COMMON BRAND NAME(S): ADCETRIS What should I tell my health care provider before I take this medicine? They need to know if you have any of these conditions: -immune system problems -infection (especially a virus infection such as chickenpox, cold sores, or herpes) -kidney disease -liver disease -low blood counts, like low white cell, platelet, or red cell counts -tingling of the fingers or toes, or other nerve disorder -an unusual or allergic reaction to brentuximab vedotin, other medicines, foods, dyes, or preservatives -pregnant or trying to get pregnant -breast-feeding How should I use this medicine? This medicine is for infusion into a vein. It is given by a health care professional in a hospital or clinic setting. Talk to your pediatrician regarding the use of this medicine in children. Special care may be needed. Overdosage: If you think you've taken too much of this medicine contact a poison control center or emergency room at once. Overdosage: If you think you have taken too much of this medicine contact a poison control center or emergency room at once. NOTE: This medicine is only for you. Do not share this medicine with others. What if I miss a dose? It is important not to miss your dose. Call your doctor or health care professional if you are unable to keep an appointment. What may interact with this medicine? This medicine may interact with the following medications: -ketoconazole -rifampin -St.  John's wort; Hypericum perforatum This list may not describe all possible interactions. Give your health care provider a list of all the medicines, herbs, non-prescription drugs, or dietary supplements you use. Also tell them if you smoke, drink alcohol, or use illegal drugs. Some items may interact with your medicine. What should I watch for while using this medicine? Visit your doctor for checks on your progress. This drug may make you feel generally unwell. This is not uncommon, as chemotherapy can affect healthy cells as well as cancer cells. Report any side effects. Continue your course of treatment even though you feel ill unless your doctor tells you to stop. Call your doctor or health care professional for advice if you get a fever, chills or sore throat, or other symptoms of a cold or flu. Do not treat yourself. This drug decreases your body's ability to fight infections. Try to avoid being around people who are sick. This medicine may increase your risk to bruise or bleed. Call your doctor or health care professional if you notice any unusual bleeding. In some patients, this medicine may cause a serious brain infection that may cause death. If you have any problems seeing, thinking, speaking, walking, or standing, tell your doctor right away. If you cannot reach your doctor, urgently seek other source of medical care. Do not become pregnant while taking this medicine. Women should inform their doctor if they wish to become pregnant or think they might be pregnant. There is a potential for serious side effects to an unborn child. Talk to your health care professional or pharmacist for more information. Do not breast-feed an infant while taking this medicine. What side effects   may I notice from receiving this medicine? Side effects that you should report to your doctor or health care professional as soon as possible: -allergic reactions like skin rash, itching or hives, swelling of the face, lips,  or tongue -changes in emotions or moods -low blood counts - this medicine may decrease the number of white blood cells, red blood cells and platelets. You may be at increased risk for infections and bleeding. -pain, tingling, numbness in the hands or feet -redness, blistering, peeling or loosening of the skin, including inside the mouth -shortness of breath -signs of infection - fever or chills, cough, sore throat, pain or difficulty passing urine -signs of decreased platelets or bleeding - bruising, pinpoint red spots on the skin, black, tarry stools, blood in the urine -signs of decreased red blood cells - unusually weak or tired, fainting spells, lightheadedness -signs of liver injury like dark yellow or brown urine; general ill feeling or flu-like symptoms; light-colored stools; loss of appetite; nausea; right upper belly pain; yellowing of the eyes or skin -stomach pain -sudden numbness or weakness of the face, arm or leg Side effects that usually do not require medical attention (Report these to your doctor or health care professional if they continue or are bothersome): -diarrhea -dizziness -headache -muscle pain -nausea, vomiting -tiredness This list may not describe all possible side effects. Call your doctor for medical advice about side effects. You may report side effects to FDA at 1-800-FDA-1088. Where should I keep my medicine? This drug is given in a hospital or clinic and will not be stored at home. NOTE: This sheet is a summary. It may not cover all possible information. If you have questions about this medicine, talk to your doctor, pharmacist, or health care provider.  2015, [employer]. (2014-02-24 16:52:15)  
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
## 2015-01-13 ENCOUNTER — Ambulatory Visit: Payer: Commercial Managed Care - PPO | Admitting: Hematology & Oncology

## 2015-01-13 ENCOUNTER — Ambulatory Visit: Payer: Commercial Managed Care - PPO

## 2015-01-13 ENCOUNTER — Other Ambulatory Visit: Payer: Commercial Managed Care - PPO | Admitting: Lab

## 2015-01-20 ENCOUNTER — Other Ambulatory Visit (HOSPITAL_BASED_OUTPATIENT_CLINIC_OR_DEPARTMENT_OTHER): Payer: Commercial Managed Care - PPO | Admitting: Lab

## 2015-01-20 ENCOUNTER — Ambulatory Visit (HOSPITAL_BASED_OUTPATIENT_CLINIC_OR_DEPARTMENT_OTHER): Payer: Commercial Managed Care - PPO

## 2015-01-20 ENCOUNTER — Encounter: Payer: Self-pay | Admitting: Hematology & Oncology

## 2015-01-20 ENCOUNTER — Ambulatory Visit (HOSPITAL_BASED_OUTPATIENT_CLINIC_OR_DEPARTMENT_OTHER): Payer: Commercial Managed Care - PPO | Admitting: Hematology & Oncology

## 2015-01-20 VITALS — BP 113/66 | HR 84 | Temp 98.0°F | Resp 18 | Ht 70.0 in | Wt 178.0 lb

## 2015-01-20 DIAGNOSIS — C8195 Hodgkin lymphoma, unspecified, lymph nodes of inguinal region and lower limb: Secondary | ICD-10-CM

## 2015-01-20 DIAGNOSIS — R1013 Epigastric pain: Secondary | ICD-10-CM

## 2015-01-20 DIAGNOSIS — R634 Abnormal weight loss: Secondary | ICD-10-CM

## 2015-01-20 DIAGNOSIS — Z5112 Encounter for antineoplastic immunotherapy: Secondary | ICD-10-CM

## 2015-01-20 DIAGNOSIS — Z9484 Stem cells transplant status: Secondary | ICD-10-CM

## 2015-01-20 LAB — CBC WITH DIFFERENTIAL (CANCER CENTER ONLY)
BASO#: 0 10*3/uL (ref 0.0–0.2)
BASO%: 1.3 % (ref 0.0–2.0)
EOS ABS: 0.2 10*3/uL (ref 0.0–0.5)
EOS%: 5.6 % (ref 0.0–7.0)
HCT: 41.3 % (ref 38.7–49.9)
HGB: 14.2 g/dL (ref 13.0–17.1)
LYMPH#: 0.6 10*3/uL — ABNORMAL LOW (ref 0.9–3.3)
LYMPH%: 19.7 % (ref 14.0–48.0)
MCH: 32.7 pg (ref 28.0–33.4)
MCHC: 34.4 g/dL (ref 32.0–35.9)
MCV: 95 fL (ref 82–98)
MONO#: 0.7 10*3/uL (ref 0.1–0.9)
MONO%: 20.9 % — AB (ref 0.0–13.0)
NEUT#: 1.7 10*3/uL (ref 1.5–6.5)
NEUT%: 52.5 % (ref 40.0–80.0)
PLATELETS: 228 10*3/uL (ref 145–400)
RBC: 4.34 10*6/uL (ref 4.20–5.70)
RDW: 14.6 % (ref 11.1–15.7)
WBC: 3.2 10*3/uL — ABNORMAL LOW (ref 4.0–10.0)

## 2015-01-20 LAB — CMP (CANCER CENTER ONLY)
ALT(SGPT): 28 U/L (ref 10–47)
AST: 33 U/L (ref 11–38)
Albumin: 4 g/dL (ref 3.3–5.5)
Alkaline Phosphatase: 71 U/L (ref 26–84)
BILIRUBIN TOTAL: 1 mg/dL (ref 0.20–1.60)
BUN: 19 mg/dL (ref 7–22)
CALCIUM: 9.2 mg/dL (ref 8.0–10.3)
CO2: 28 mEq/L (ref 18–33)
CREATININE: 1.2 mg/dL (ref 0.6–1.2)
Chloride: 101 mEq/L (ref 98–108)
Glucose, Bld: 72 mg/dL — ABNORMAL LOW (ref 73–118)
Potassium: 3.9 mEq/L (ref 3.3–4.7)
Sodium: 143 mEq/L (ref 128–145)
Total Protein: 7.1 g/dL (ref 6.4–8.1)

## 2015-01-20 MED ORDER — ACETAMINOPHEN 325 MG PO TABS
650.0000 mg | ORAL_TABLET | Freq: Once | ORAL | Status: AC
  Administered 2015-01-20: 650 mg via ORAL

## 2015-01-20 MED ORDER — DEXAMETHASONE SODIUM PHOSPHATE 10 MG/ML IJ SOLN
INTRAMUSCULAR | Status: AC
  Filled 2015-01-20: qty 1

## 2015-01-20 MED ORDER — DIPHENHYDRAMINE HCL 50 MG/ML IJ SOLN
50.0000 mg | Freq: Once | INTRAMUSCULAR | Status: AC
  Administered 2015-01-20: 50 mg via INTRAVENOUS

## 2015-01-20 MED ORDER — TESTOSTERONE CYPIONATE 200 MG/ML IM SOLN
INTRAMUSCULAR | Status: AC
  Filled 2015-01-20: qty 2

## 2015-01-20 MED ORDER — SODIUM CHLORIDE 0.9 % IJ SOLN
10.0000 mL | INTRAMUSCULAR | Status: DC | PRN
  Administered 2015-01-20: 10 mL
  Filled 2015-01-20: qty 10

## 2015-01-20 MED ORDER — TESTOSTERONE CYPIONATE 200 MG/ML IM SOLN
400.0000 mg | INTRAMUSCULAR | Status: DC
  Administered 2015-01-20: 400 mg via INTRAMUSCULAR

## 2015-01-20 MED ORDER — DEXAMETHASONE SODIUM PHOSPHATE 10 MG/ML IJ SOLN
10.0000 mg | Freq: Once | INTRAMUSCULAR | Status: AC
  Administered 2015-01-20: 10 mg via INTRAVENOUS

## 2015-01-20 MED ORDER — SODIUM CHLORIDE 0.9 % IV SOLN
1.6200 mg/kg | Freq: Once | INTRAVENOUS | Status: AC
  Administered 2015-01-20: 130 mg via INTRAVENOUS
  Filled 2015-01-20: qty 26

## 2015-01-20 MED ORDER — ONDANSETRON 8 MG/50ML IVPB (CHCC)
8.0000 mg | Freq: Once | INTRAVENOUS | Status: AC
  Administered 2015-01-20: 8 mg via INTRAVENOUS

## 2015-01-20 MED ORDER — SODIUM CHLORIDE 0.9 % IV SOLN
Freq: Once | INTRAVENOUS | Status: AC
  Administered 2015-01-20: 10:00:00 via INTRAVENOUS

## 2015-01-20 MED ORDER — DIPHENHYDRAMINE HCL 50 MG/ML IJ SOLN
INTRAMUSCULAR | Status: AC
  Filled 2015-01-20: qty 1

## 2015-01-20 MED ORDER — ACETAMINOPHEN 325 MG PO TABS
ORAL_TABLET | ORAL | Status: AC
  Filled 2015-01-20: qty 2

## 2015-01-20 MED ORDER — HEPARIN SOD (PORK) LOCK FLUSH 100 UNIT/ML IV SOLN
500.0000 [IU] | Freq: Once | INTRAVENOUS | Status: AC | PRN
  Administered 2015-01-20: 500 [IU]
  Filled 2015-01-20: qty 5

## 2015-01-20 NOTE — Patient Instructions (Addendum)
Brentuximab vedotin solution for injection What is this medicine? BRENTUXIMAB VEDOTIN (bren TUX see mab ve DOE tin) is a chemotherapy drug. It targets a specific protein within cancer cells and stops the cancer cells from growing. This medicine is used for treating Hodgkin's lymphoma and certain non-Hodgkin's lymphomas, such as systemic anaplastic large cell lymphoma. This medicine may be used for other purposes; ask your health care provider or pharmacist if you have questions. COMMON BRAND NAME(S): ADCETRIS What should I tell my health care provider before I take this medicine? They need to know if you have any of these conditions: -immune system problems -infection (especially a virus infection such as chickenpox, cold sores, or herpes) -kidney disease -liver disease -low blood counts, like low white cell, platelet, or red cell counts -tingling of the fingers or toes, or other nerve disorder -an unusual or allergic reaction to brentuximab vedotin, other medicines, foods, dyes, or preservatives -pregnant or trying to get pregnant -breast-feeding How should I use this medicine? This medicine is for infusion into a vein. It is given by a health care professional in a hospital or clinic setting. Talk to your pediatrician regarding the use of this medicine in children. Special care may be needed. Overdosage: If you think you've taken too much of this medicine contact a poison control center or emergency room at once. Overdosage: If you think you have taken too much of this medicine contact a poison control center or emergency room at once. NOTE: This medicine is only for you. Do not share this medicine with others. What if I miss a dose? It is important not to miss your dose. Call your doctor or health care professional if you are unable to keep an appointment. What may interact with this medicine? This medicine may interact with the following medications: -ketoconazole -rifampin -St.  John's wort; Hypericum perforatum This list may not describe all possible interactions. Give your health care provider a list of all the medicines, herbs, non-prescription drugs, or dietary supplements you use. Also tell them if you smoke, drink alcohol, or use illegal drugs. Some items may interact with your medicine. What should I watch for while using this medicine? Visit your doctor for checks on your progress. This drug may make you feel generally unwell. This is not uncommon, as chemotherapy can affect healthy cells as well as cancer cells. Report any side effects. Continue your course of treatment even though you feel ill unless your doctor tells you to stop. Call your doctor or health care professional for advice if you get a fever, chills or sore throat, or other symptoms of a cold or flu. Do not treat yourself. This drug decreases your body's ability to fight infections. Try to avoid being around people who are sick. This medicine may increase your risk to bruise or bleed. Call your doctor or health care professional if you notice any unusual bleeding. In some patients, this medicine may cause a serious brain infection that may cause death. If you have any problems seeing, thinking, speaking, walking, or standing, tell your doctor right away. If you cannot reach your doctor, urgently seek other source of medical care. Do not become pregnant while taking this medicine. Women should inform their doctor if they wish to become pregnant or think they might be pregnant. There is a potential for serious side effects to an unborn child. Talk to your health care professional or pharmacist for more information. Do not breast-feed an infant while taking this medicine. What side effects  may I notice from receiving this medicine? Side effects that you should report to your doctor or health care professional as soon as possible: -allergic reactions like skin rash, itching or hives, swelling of the face, lips,  or tongue -changes in emotions or moods -low blood counts - this medicine may decrease the number of white blood cells, red blood cells and platelets. You may be at increased risk for infections and bleeding. -pain, tingling, numbness in the hands or feet -redness, blistering, peeling or loosening of the skin, including inside the mouth -shortness of breath -signs of infection - fever or chills, cough, sore throat, pain or difficulty passing urine -signs of decreased platelets or bleeding - bruising, pinpoint red spots on the skin, black, tarry stools, blood in the urine -signs of decreased red blood cells - unusually weak or tired, fainting spells, lightheadedness -signs of liver injury like dark yellow or brown urine; general ill feeling or flu-like symptoms; light-colored stools; loss of appetite; nausea; right upper belly pain; yellowing of the eyes or skin -stomach pain -sudden numbness or weakness of the face, arm or leg Side effects that usually do not require medical attention (Report these to your doctor or health care professional if they continue or are bothersome): -diarrhea -dizziness -headache -muscle pain -nausea, vomiting -tiredness This list may not describe all possible side effects. Call your doctor for medical advice about side effects. You may report side effects to FDA at 1-800-FDA-1088. Where should I keep my medicine? This drug is given in a hospital or clinic and will not be stored at home. NOTE: This sheet is a summary. It may not cover all possible information. If you have questions about this medicine, talk to your doctor, pharmacist, or health care provider.  2015, [employer] Standard. (2014-02-24 16:52:15) Testosterone injection What is this medicine? TESTOSTERONE (tes TOS ter one) is the main male hormone. It supports normal male development such as muscle growth, facial hair, and deep voice. It is used in males to treat low testosterone levels. This  medicine may be used for other purposes; ask your health care provider or pharmacist if you have questions. COMMON BRAND NAME(S): Andro-L.A., Aveed, Delatestryl, Depo-Testosterone, Virilon What should I tell my health care provider before I take this medicine? They need to know if you have any of these conditions: -breast cancer -diabetes -heart disease -kidney disease -liver disease -lung disease -prostate cancer, enlargement -an unusual or allergic reaction to testosterone, other medicines, foods, dyes, or preservatives -pregnant or trying to get pregnant -breast-feeding How should I use this medicine? This medicine is for injection into a muscle. It is usually given by a health care professional in a hospital or clinic setting. Contact your pediatrician regarding the use of this medicine in children. While this medicine may be prescribed for children as young as 75 years of age for selected conditions, precautions do apply. Overdosage: If you think you have taken too much of this medicine contact a poison control center or emergency room at once. NOTE: This medicine is only for you. Do not share this medicine with others. What if I miss a dose? Try not to miss a dose. Your doctor or health care professional will tell you when your next injection is due. Notify the office if you are unable to keep an appointment. What may interact with this medicine? -medicines for diabetes -medicines that treat or prevent blood clots like warfarin -oxyphenbutazone -propranolol -steroid medicines like prednisone or cortisone This list may not describe all  possible interactions. Give your health care provider a list of all the medicines, herbs, non-prescription drugs, or dietary supplements you use. Also tell them if you smoke, drink alcohol, or use illegal drugs. Some items may interact with your medicine. What should I watch for while using this medicine? Visit your doctor or health care professional  for regular checks on your progress. They will need to check the level of testosterone in your blood. This medicine is only approved for use in men who have low levels of testosterone related to certain medical conditions. Heart attacks and strokes have been reported with the use of this medicine. Notify your doctor or health care professional and seek emergency treatment if you develop breathing problems; changes in vision; confusion; chest pain or chest tightness; sudden arm pain; severe, sudden headache; trouble speaking or understanding; sudden numbness or weakness of the face, arm or leg; loss of balance or coordination. Talk to your doctor about the risks and benefits of this medicine. This medicine may affect blood sugar levels. If you have diabetes, check with your doctor or health care professional before you change your diet or the dose of your diabetic medicine. This drug is banned from use in athletes by most athletic organizations. What side effects may I notice from receiving this medicine? Side effects that you should report to your doctor or health care professional as soon as possible: -allergic reactions like skin rash, itching or hives, swelling of the face, lips, or tongue -breast enlargement -breathing problems -changes in mood, especially anger, depression, or rage -dark urine -general ill feeling or flu-like symptoms -light-colored stools -loss of appetite, nausea -nausea, vomiting -right upper belly pain -stomach pain -swelling of ankles -too frequent or persistent erections -trouble passing urine or change in the amount of urine -unusually weak or tired -yellowing of the eyes or skin Additional side effects that can occur in women include: -deep or hoarse voice -facial hair growth -irregular menstrual periods Side effects that usually do not require medical attention (report to your doctor or health care professional if they continue or are  bothersome): -acne -change in sex drive or performance -hair loss -headache This list may not describe all possible side effects. Call your doctor for medical advice about side effects. You may report side effects to FDA at 1-800-FDA-1088. Where should I keep my medicine? Keep out of the reach of children. This medicine can be abused. Keep your medicine in a safe place to protect it from theft. Do not share this medicine with anyone. Selling or giving away this medicine is dangerous and against the law. Store at room temperature between 20 and 25 degrees C (68 and 77 degrees F). Do not freeze. Protect from light. Follow the directions for the product you are prescribed. Throw away any unused medicine after the expiration date. NOTE: This sheet is a summary. It may not cover all possible information. If you have questions about this medicine, talk to your doctor, pharmacist, or health care provider.  2015, [employer] Standard. (2014-01-28 45:40:98)

## 2015-01-20 NOTE — Progress Notes (Signed)
Hematology and Oncology Follow Up Visit  Brian [name] [id number] [birth year] 54 y.o. 01/20/2015   Principle Diagnosis:  Recurrent Hodgkin's Disease -  S/p autologous transplant   Current Therapy:    S/p Adcetris x 10 cycles     Interim History:  [name] is in for followup. He is doing okay. He's working out more. He is going to work. Work is somewhat stressful.  He has some personal issues that he try to deal with. He is doing good job with this.  His neuropathy seems be improving a little bit. We decreased his Adcetris dose a little bit. I think this is helped.  He does have some loose stools. There is no diarrhea.  He does not seem to be bothered too much by reflux.  He's had no fever. He's had no rashes. He's had no leading.  His overall performance status is ECOG 1  Medications:  Current outpatient prescriptions:  .  acetaminophen (TYLENOL) 325 MG tablet, Take 325 mg by mouth every 6 (six) hours as needed for mild pain., Disp: , Rfl:  .  ALPRAZolam (XANAX) 0.5 MG tablet, Take 0.5 mg by mouth daily as needed for anxiety or sleep., Disp: , Rfl:  .  Cholecalciferol (VITAMIN D-3) 1000 UNITS CAPS, Take by mouth 2 (two) times daily., Disp: , Rfl:  .  citalopram (CELEXA) 20 MG tablet, Take 1 tablet (20 mg total) by mouth daily., Disp: 30 tablet, Rfl: 6 .  dexamethasone (DECADRON) 4 MG tablet, Take 2 tablets (8 mg total) by mouth 2 (two) times daily with a meal. Start the day after chemotherapy for 2 days. Take with food., Disp: 30 tablet, Rfl: 1 .  ibuprofen (ADVIL,MOTRIN) 200 MG tablet, Take 200-400 mg by mouth every 6 (six) hours as needed for mild pain. , Disp: , Rfl:  .  lidocaine (LIDODERM) 5 %, Place 1 patch onto the skin daily. Remove & Discard patch within 12 hours or as directed by MD, Disp: 30 patch, Rfl: 3 .  lidocaine-prilocaine (EMLA) cream, Apply 1 application topically as needed. Apply nickel sized amount to portacath site at least 1 hour prior to chemotherapy.,  Disp: 30 g, Rfl: 1 .  LORazepam (ATIVAN) 1 MG tablet, TAKE 1/2 A TABLET BY MOUTH TWICE DAILY DURING THE DAY FOR ANXIETY, AND 1 TABLET AT BEDTIME IF NEEDED, Disp: 20 tablet, Rfl: 3 .  Multiple Vitamin (MULTIVITAMIN) tablet, Take 1 tablet by mouth daily., Disp: , Rfl:  .  ondansetron (ZOFRAN) 8 MG tablet, Take 1 tablet (8 mg total) by mouth 2 (two) times daily. Start the day after chemo for 2 days. Then take as needed for nausea or vomiting., Disp: 30 tablet, Rfl: 1 .  prochlorperazine (COMPAZINE) 10 MG tablet, Take 1 tablet (10 mg total) by mouth every 6 (six) hours as needed (Nausea or vomiting)., Disp: 30 tablet, Rfl: 1 .  Pyridoxine HCl (VITAMIN B-6) 250 MG tablet, Take 1 tablet (250 mg total) by mouth daily., Disp: 30 tablet, Rfl: 6 No current facility-administered medications for this visit.  Facility-Administered Medications Ordered in Other Visits:  .  brentuximab vedotin (ADCETRIS) 130 mg in sodium chloride 0.9 % 100 mL chemo infusion, 1.62 mg/kg (Treatment Plan Actual), Intravenous, Once, Brian Napoleon, MD .  heparin lock flush 100 unit/mL, 500 Units, Intracatheter, Once PRN, Brian Napoleon, MD .  sodium chloride 0.9 % injection 10 mL, 10 mL, Intracatheter, PRN, Brian Napoleon, MD, 10 mL at 06/17/14 1355 .  sodium chloride  0.9 % injection 10 mL, 10 mL, Intracatheter, PRN, Brian Napoleon, MD  Allergies: No Known Allergies  Past Medical History, Surgical history, Social history, and Family History were reviewed and updated.  Review of Systems: As above  Physical Exam:  height is 5\' 10"  (1.778 m) and weight is 178 lb (80.74 kg). His oral temperature is 98 F (36.7 C). His blood pressure is 113/66 and his pulse is 84. His respiration is 18.   Well-developed and well-nourished white gentleman. Head and neck exam shows no ocular or oral lesions. There are no palpable cervical or supraclavicular lymph nodes. Lungs are clear. Cardiac exam regular rate and rhythm with no murmurs, rubs or  bruits.. Abdomen is soft. He has good bowel sounds. There is no fluid wave. There is no palpable liver or spleen tip. Back exam shows a thoracoscopy site in the left lateral chest wall. This is healed. There is no swelling or erythema. Extremities shows no clubbing, cyanosis or edema. Skin exam shows no rashes, ecchymoses or petechia . Neurological exam is nonfocal.  Lab Results  Component Value Date   WBC 3.2* 01/20/2015   HGB 14.2 01/20/2015   HCT 41.3 01/20/2015   MCV 95 01/20/2015   PLT 228 01/20/2015     Chemistry      Component Value Date/Time   NA 143 01/20/2015 0838   NA 141 06/02/2014 0335   NA 142 03/25/2013 0828   K 3.9 01/20/2015 0838   K 4.0 06/02/2014 0335   K 4.1 03/25/2013 0828   CL 101 01/20/2015 0838   CL 101 06/02/2014 0335   CL 107 03/25/2013 0828   CO2 28 01/20/2015 0838   CO2 27 06/02/2014 0335   CO2 27 03/25/2013 0828   BUN 19 01/20/2015 0838   BUN 16 06/02/2014 0335   BUN 12.7 03/25/2013 0828   CREATININE 1.2 01/20/2015 0838   CREATININE 1.00 06/02/2014 0335   CREATININE 0.9 03/25/2013 0828      Component Value Date/Time   CALCIUM 9.2 01/20/2015 0838   CALCIUM 9.3 06/02/2014 0335   CALCIUM 9.2 03/25/2013 0828   ALKPHOS 71 01/20/2015 0838   ALKPHOS 71 06/02/2014 0335   ALKPHOS 122 03/25/2013 0828   AST 33 01/20/2015 0838   AST 24 06/02/2014 0335   AST 33 03/25/2013 0828   ALT 28 01/20/2015 0838   ALT 20 06/02/2014 0335   ALT 65* 03/25/2013 0828   BILITOT 1.00 01/20/2015 0838   BILITOT 0.5 06/02/2014 0335   BILITOT 0.31 03/25/2013 0828         Impression and Plan: [name] is a 54 year old gentleman.  He has history of recurrent Hodgkin's disease. 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 He then had a recurrence after this.  We were thinking of trying to do an allogeneic bone marrow transplant However, he does not want to go through this. He realizes that there are a lot of side effects with an allogeneic  transplant.  We will continue the Adcetris. I will continue to treat him every 4 weeks. I probably would continue to give him Adcetris every 4 weeks for 4 cycles and then repeat another PET scan. If that PET scan was good, then we can try every 6 weeks.  I spent 35 minutes with him. He has a lot going on in his life right now. There is certainly a good amount of stress that he is trying to deal with.  I will also give  him a dose of testosterone today. Brian Napoleon, MD 2/25/201610:21 AM

## 2015-01-21 ENCOUNTER — Encounter: Payer: Self-pay | Admitting: *Deleted

## 2015-01-21 LAB — VITAMIN D 25 HYDROXY (VIT D DEFICIENCY, FRACTURES): Vit D, 25-Hydroxy: 34 ng/mL (ref 30–100)

## 2015-01-21 LAB — LACTATE DEHYDROGENASE: LDH: 140 U/L (ref 94–250)

## 2015-01-21 LAB — TESTOSTERONE: Testosterone: 232 ng/dL — ABNORMAL LOW (ref 300–890)

## 2015-01-27 ENCOUNTER — Other Ambulatory Visit: Payer: Self-pay | Admitting: Emergency Medicine

## 2015-01-27 NOTE — Telephone Encounter (Signed)
Dr Everlene Farrier, you last saw pt last March. Do you want to deny and have pt RTC?

## 2015-01-28 NOTE — Telephone Encounter (Signed)
Faxed

## 2015-02-03 ENCOUNTER — Other Ambulatory Visit: Payer: Commercial Managed Care - PPO | Admitting: Lab

## 2015-02-03 ENCOUNTER — Ambulatory Visit: Payer: Commercial Managed Care - PPO | Admitting: Hematology & Oncology

## 2015-02-03 ENCOUNTER — Ambulatory Visit: Payer: Commercial Managed Care - PPO

## 2015-02-10 ENCOUNTER — Encounter: Payer: Self-pay | Admitting: Pharmacist

## 2015-02-17 ENCOUNTER — Encounter: Payer: Self-pay | Admitting: Hematology & Oncology

## 2015-02-17 ENCOUNTER — Other Ambulatory Visit (HOSPITAL_BASED_OUTPATIENT_CLINIC_OR_DEPARTMENT_OTHER): Payer: Commercial Managed Care - PPO

## 2015-02-17 ENCOUNTER — Ambulatory Visit (HOSPITAL_BASED_OUTPATIENT_CLINIC_OR_DEPARTMENT_OTHER): Payer: Commercial Managed Care - PPO

## 2015-02-17 ENCOUNTER — Ambulatory Visit (HOSPITAL_BASED_OUTPATIENT_CLINIC_OR_DEPARTMENT_OTHER): Payer: Commercial Managed Care - PPO | Admitting: Hematology & Oncology

## 2015-02-17 ENCOUNTER — Other Ambulatory Visit: Payer: Self-pay | Admitting: Family

## 2015-02-17 VITALS — BP 121/80 | HR 86 | Temp 98.0°F | Resp 18 | Ht 70.0 in | Wt 179.0 lb

## 2015-02-17 DIAGNOSIS — C8190 Hodgkin lymphoma, unspecified, unspecified site: Secondary | ICD-10-CM | POA: Diagnosis not present

## 2015-02-17 DIAGNOSIS — C8195 Hodgkin lymphoma, unspecified, lymph nodes of inguinal region and lower limb: Secondary | ICD-10-CM

## 2015-02-17 DIAGNOSIS — Z9484 Stem cells transplant status: Secondary | ICD-10-CM

## 2015-02-17 DIAGNOSIS — R634 Abnormal weight loss: Secondary | ICD-10-CM

## 2015-02-17 DIAGNOSIS — Z5112 Encounter for antineoplastic immunotherapy: Secondary | ICD-10-CM

## 2015-02-17 LAB — CBC WITH DIFFERENTIAL (CANCER CENTER ONLY)
BASO#: 0 10*3/uL (ref 0.0–0.2)
BASO%: 1.2 % (ref 0.0–2.0)
EOS%: 3.8 % (ref 0.0–7.0)
Eosinophils Absolute: 0.1 10*3/uL (ref 0.0–0.5)
HEMATOCRIT: 41.6 % (ref 38.7–49.9)
HGB: 14.4 g/dL (ref 13.0–17.1)
LYMPH#: 0.6 10*3/uL — AB (ref 0.9–3.3)
LYMPH%: 18.3 % (ref 14.0–48.0)
MCH: 33 pg (ref 28.0–33.4)
MCHC: 34.6 g/dL (ref 32.0–35.9)
MCV: 95 fL (ref 82–98)
MONO#: 0.6 10*3/uL (ref 0.1–0.9)
MONO%: 16.3 % — AB (ref 0.0–13.0)
NEUT%: 60.4 % (ref 40.0–80.0)
NEUTROS ABS: 2 10*3/uL (ref 1.5–6.5)
Platelets: 217 10*3/uL (ref 145–400)
RBC: 4.36 10*6/uL (ref 4.20–5.70)
RDW: 13.9 % (ref 11.1–15.7)
WBC: 3.4 10*3/uL — ABNORMAL LOW (ref 4.0–10.0)

## 2015-02-17 LAB — TESTOSTERONE: Testosterone: 187 ng/dL — ABNORMAL LOW (ref 300–890)

## 2015-02-17 LAB — CMP (CANCER CENTER ONLY)
ALK PHOS: 67 U/L (ref 26–84)
ALT: 27 U/L (ref 10–47)
AST: 29 U/L (ref 11–38)
Albumin: 3.9 g/dL (ref 3.3–5.5)
BILIRUBIN TOTAL: 0.7 mg/dL (ref 0.20–1.60)
BUN, Bld: 19 mg/dL (ref 7–22)
CALCIUM: 9.5 mg/dL (ref 8.0–10.3)
CO2: 29 mEq/L (ref 18–33)
Chloride: 105 mEq/L (ref 98–108)
Creat: 1 mg/dl (ref 0.6–1.2)
Glucose, Bld: 105 mg/dL (ref 73–118)
Potassium: 4.7 mEq/L (ref 3.3–4.7)
Sodium: 142 mEq/L (ref 128–145)
Total Protein: 7 g/dL (ref 6.4–8.1)

## 2015-02-17 MED ORDER — SODIUM CHLORIDE 0.9 % IV SOLN
Freq: Once | INTRAVENOUS | Status: AC
  Administered 2015-02-17: 10:00:00 via INTRAVENOUS

## 2015-02-17 MED ORDER — ALTEPLASE 2 MG IJ SOLR
2.0000 mg | Freq: Once | INTRAMUSCULAR | Status: DC | PRN
Start: 2015-02-17 — End: 2015-02-17
  Filled 2015-02-17: qty 2

## 2015-02-17 MED ORDER — HEPARIN SOD (PORK) LOCK FLUSH 100 UNIT/ML IV SOLN
250.0000 [IU] | Freq: Once | INTRAVENOUS | Status: DC | PRN
  Filled 2015-02-17: qty 5

## 2015-02-17 MED ORDER — SODIUM CHLORIDE 0.9 % IJ SOLN
10.0000 mL | INTRAMUSCULAR | Status: DC | PRN
  Administered 2015-02-17: 10 mL
  Filled 2015-02-17: qty 10

## 2015-02-17 MED ORDER — DIPHENHYDRAMINE HCL 50 MG/ML IJ SOLN
INTRAMUSCULAR | Status: AC
  Filled 2015-02-17: qty 1

## 2015-02-17 MED ORDER — HEPARIN SOD (PORK) LOCK FLUSH 100 UNIT/ML IV SOLN
500.0000 [IU] | Freq: Once | INTRAVENOUS | Status: AC | PRN
  Administered 2015-02-17: 500 [IU]
  Filled 2015-02-17: qty 5

## 2015-02-17 MED ORDER — ACETAMINOPHEN 325 MG PO TABS
ORAL_TABLET | ORAL | Status: AC
  Filled 2015-02-17: qty 2

## 2015-02-17 MED ORDER — SODIUM CHLORIDE 0.9 % IV SOLN
1.8000 mg/kg | Freq: Once | INTRAVENOUS | Status: AC
  Administered 2015-02-17: 145 mg via INTRAVENOUS
  Filled 2015-02-17: qty 29

## 2015-02-17 MED ORDER — DIPHENHYDRAMINE HCL 50 MG/ML IJ SOLN
50.0000 mg | Freq: Once | INTRAMUSCULAR | Status: AC
  Administered 2015-02-17: 50 mg via INTRAVENOUS

## 2015-02-17 MED ORDER — SODIUM CHLORIDE 0.9 % IJ SOLN
3.0000 mL | INTRAMUSCULAR | Status: DC | PRN
  Filled 2015-02-17: qty 10

## 2015-02-17 MED ORDER — ACETAMINOPHEN 325 MG PO TABS
650.0000 mg | ORAL_TABLET | Freq: Once | ORAL | Status: AC
  Administered 2015-02-17: 650 mg via ORAL

## 2015-02-17 MED ORDER — SODIUM CHLORIDE 0.9 % IV SOLN
Freq: Once | INTRAVENOUS | Status: AC
  Administered 2015-02-17: 11:00:00 via INTRAVENOUS
  Filled 2015-02-17: qty 4

## 2015-02-17 NOTE — Patient Instructions (Signed)
[facility] Discharge Instructions for Patients Receiving Chemotherapy  Today you received the following chemotherapy agents Adcetris  To help prevent nausea and vomiting after your treatment, we encourage you to take your nausea medication    If you develop nausea and vomiting that is not controlled by your nausea medication, call the clinic.   BELOW ARE SYMPTOMS THAT SHOULD BE REPORTED IMMEDIATELY:  *FEVER GREATER THAN 100.5 F  *CHILLS WITH OR WITHOUT FEVER  NAUSEA AND VOMITING THAT IS NOT CONTROLLED WITH YOUR NAUSEA MEDICATION  *UNUSUAL SHORTNESS OF BREATH  *UNUSUAL BRUISING OR BLEEDING  TENDERNESS IN MOUTH AND THROAT WITH OR WITHOUT PRESENCE OF ULCERS  *URINARY PROBLEMS  *BOWEL PROBLEMS  UNUSUAL RASH Items with * indicate a potential emergency and should be followed up as soon as possible.  Feel free to call the clinic you have any questions or concerns. The clinic phone number is [phone].  Please show the [facility] at check-in to the Emergency Department and triage nurse.

## 2015-02-17 NOTE — Progress Notes (Signed)
Hematology and Oncology Follow Up Visit  Brian [name] [id number] August 24, 1961 54 y.o. 02/17/2015   Principle Diagnosis:  Recurrent Hodgkin's Disease -  S/p autologous transplant   Current Therapy:    S/p Adcetris x 11 cycles     Interim History:  Mr.  Teaster is in for followup. He is doing okay. He's working out more. He is going to work. Work is somewhat stressful.  He has some personal issues that he try to deal with. He is doing good job with this.  His neuropathy seems be improving a little bit. We decreased his Adcetris dose a little bit. I think this is helped. I think we'll probably increase the dose of Adcetris now back to its original dose. His blood counts look good. He is doing well. I want to try to be as aggressive as I can with dosing.  He does have some loose stools. There is no diarrhea.  He does not seem to be bothered too much by reflux.  He's had no fever. He's had no rashes. He's had no leading.  His overall performance status is ECOG 1  Medications:  Current outpatient prescriptions:  .  acetaminophen (TYLENOL) 325 MG tablet, Take 325 mg by mouth every 6 (six) hours as needed for mild pain., Disp: , Rfl:  .  ALPRAZolam (XANAX) 0.5 MG tablet, TAKE 1 TABLET BY MOUTH AT BEDTIME AS NEEDED FOR SLEEP, Disp: 30 tablet, Rfl: 2 .  Cholecalciferol (VITAMIN D-3) 1000 UNITS CAPS, Take by mouth 2 (two) times daily., Disp: , Rfl:  .  citalopram (CELEXA) 20 MG tablet, Take 1 tablet (20 mg total) by mouth daily., Disp: 30 tablet, Rfl: 6 .  dexamethasone (DECADRON) 4 MG tablet, Take 2 tablets (8 mg total) by mouth 2 (two) times daily with a meal. Start the day after chemotherapy for 2 days. Take with food., Disp: 30 tablet, Rfl: 1 .  ibuprofen (ADVIL,MOTRIN) 200 MG tablet, Take 200-400 mg by mouth every 6 (six) hours as needed for mild pain. , Disp: , Rfl:  .  lidocaine (LIDODERM) 5 %, Place 1 patch onto the skin daily. Remove & Discard patch within 12 hours or as directed by MD,  Disp: 30 patch, Rfl: 3 .  lidocaine-prilocaine (EMLA) cream, Apply 1 application topically as needed. Apply nickel sized amount to portacath site at least 1 hour prior to chemotherapy., Disp: 30 g, Rfl: 1 .  LORazepam (ATIVAN) 1 MG tablet, TAKE 1/2 A TABLET BY MOUTH TWICE DAILY DURING THE DAY FOR ANXIETY, AND 1 TABLET AT BEDTIME IF NEEDED, Disp: 20 tablet, Rfl: 3 .  Multiple Vitamin (MULTIVITAMIN) tablet, Take 1 tablet by mouth daily., Disp: , Rfl:  .  ondansetron (ZOFRAN) 8 MG tablet, Take 1 tablet (8 mg total) by mouth 2 (two) times daily. Start the day after chemo for 2 days. Then take as needed for nausea or vomiting., Disp: 30 tablet, Rfl: 1 .  prochlorperazine (COMPAZINE) 10 MG tablet, Take 1 tablet (10 mg total) by mouth every 6 (six) hours as needed (Nausea or vomiting)., Disp: 30 tablet, Rfl: 1 .  Pyridoxine HCl (VITAMIN B-6) 250 MG tablet, Take 1 tablet (250 mg total) by mouth daily., Disp: 30 tablet, Rfl: 6 No current facility-administered medications for this visit.  Facility-Administered Medications Ordered in Other Visits:  .  sodium chloride 0.9 % injection 10 mL, 10 mL, Intracatheter, PRN, [name], MD, 10 mL at 06/17/14 1355  Allergies: No Known Allergies  Past Medical History, Surgical  history, Social history, and Family History were reviewed and updated.  Review of Systems: As above  Physical Exam:  height is 5\' 10"  (1.778 m) and weight is 179 lb (81.194 kg). His oral temperature is 98 F (36.7 C). His blood pressure is 121/80 and his pulse is 86. His respiration is 18.   Well-developed and well-nourished white gentleman. Head and neck exam shows no ocular or oral lesions. There are no palpable cervical or supraclavicular lymph nodes. Lungs are clear. Cardiac exam regular rate and rhythm with no murmurs, rubs or bruits.. Abdomen is soft. He has good bowel sounds. There is no fluid wave. There is no palpable liver or spleen tip. Back exam shows a thoracoscopy site in  the left lateral chest wall. This is healed. There is no swelling or erythema. Extremities shows no clubbing, cyanosis or edema. Skin exam shows no rashes, ecchymoses or petechia . Neurological exam is nonfocal.  Lab Results  Component Value Date   WBC 3.4* 02/17/2015   HGB 14.4 02/17/2015   HCT 41.6 02/17/2015   MCV 95 02/17/2015   PLT 217 02/17/2015     Chemistry      Component Value Date/Time   NA 142 02/17/2015 0928   NA 141 06/02/2014 0335   NA 142 03/25/2013 0828   K 4.7 02/17/2015 0928   K 4.0 06/02/2014 0335   K 4.1 03/25/2013 0828   CL 105 02/17/2015 0928   CL 101 06/02/2014 0335   CL 107 03/25/2013 0828   CO2 29 02/17/2015 0928   CO2 27 06/02/2014 0335   CO2 27 03/25/2013 0828   BUN 19 02/17/2015 0928   BUN 16 06/02/2014 0335   BUN 12.7 03/25/2013 0828   CREATININE 1.0 02/17/2015 0928   CREATININE 1.00 06/02/2014 0335   CREATININE 0.9 03/25/2013 0828      Component Value Date/Time   CALCIUM 9.5 02/17/2015 0928   CALCIUM 9.3 06/02/2014 0335   CALCIUM 9.2 03/25/2013 0828   ALKPHOS 67 02/17/2015 0928   ALKPHOS 71 06/02/2014 0335   ALKPHOS 122 03/25/2013 0828   AST 29 02/17/2015 0928   AST 24 06/02/2014 0335   AST 33 03/25/2013 0828   ALT 27 02/17/2015 0928   ALT 20 06/02/2014 0335   ALT 65* 03/25/2013 0828   BILITOT 0.70 02/17/2015 0928   BILITOT 0.5 06/02/2014 0335   BILITOT 0.31 03/25/2013 0828         Impression and Plan: Mr. Yeatts is a 54 year old gentleman.  He has history of recurrent Hodgkin's disease. 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 He then had a recurrence after this.  We were thinking of trying to do an allogeneic bone marrow transplant However, he does not want to go through this. He realizes that there are a lot of side effects with an allogeneic transplant.  We will continue the Adcetris. I will continue to treat him every 4 weeks. With some of the latest information as cannot now, I think 16 cycles of  Adcetris will be appropriate.  We will see him back in April. I will plan for another scan in May.  I spent 35 minutes with him. He has a lot going on in his life right now. There is certainly a good amount of stress that he is trying to deal with.   [name], MD 3/24/201610:18 AM

## 2015-02-24 ENCOUNTER — Other Ambulatory Visit: Payer: Commercial Managed Care - PPO | Admitting: Lab

## 2015-02-24 ENCOUNTER — Ambulatory Visit: Payer: Commercial Managed Care - PPO | Admitting: Hematology & Oncology

## 2015-02-24 ENCOUNTER — Ambulatory Visit: Payer: Commercial Managed Care - PPO

## 2015-02-25 DIAGNOSIS — G459 Transient cerebral ischemic attack, unspecified: Secondary | ICD-10-CM

## 2015-02-25 HISTORY — DX: Transient cerebral ischemic attack, unspecified: G45.9

## 2015-03-04 ENCOUNTER — Other Ambulatory Visit: Payer: Self-pay | Admitting: *Deleted

## 2015-03-04 ENCOUNTER — Inpatient Hospital Stay (HOSPITAL_COMMUNITY)
Admission: EM | Admit: 2015-03-04 | Discharge: 2015-03-05 | DRG: 069 | Disposition: A | Discharge status: Home / Self Care | Payer: Commercial Managed Care - PPO | Attending: Internal Medicine | Admitting: Internal Medicine

## 2015-03-04 ENCOUNTER — Telehealth: Payer: Self-pay | Admitting: *Deleted

## 2015-03-04 ENCOUNTER — Inpatient Hospital Stay (HOSPITAL_COMMUNITY): Payer: Commercial Managed Care - PPO

## 2015-03-04 ENCOUNTER — Encounter (HOSPITAL_COMMUNITY): Payer: Self-pay | Admitting: Emergency Medicine

## 2015-03-04 ENCOUNTER — Other Ambulatory Visit: Payer: Self-pay

## 2015-03-04 ENCOUNTER — Other Ambulatory Visit (HOSPITAL_BASED_OUTPATIENT_CLINIC_OR_DEPARTMENT_OTHER): Payer: Commercial Managed Care - PPO

## 2015-03-04 ENCOUNTER — Emergency Department (HOSPITAL_COMMUNITY): Payer: Commercial Managed Care - PPO

## 2015-03-04 DIAGNOSIS — C8195 Hodgkin lymphoma, unspecified, lymph nodes of inguinal region and lower limb: Secondary | ICD-10-CM | POA: Diagnosis not present

## 2015-03-04 DIAGNOSIS — G510 Bell's palsy: Secondary | ICD-10-CM | POA: Diagnosis present

## 2015-03-04 DIAGNOSIS — C8190 Hodgkin lymphoma, unspecified, unspecified site: Secondary | ICD-10-CM | POA: Diagnosis not present

## 2015-03-04 DIAGNOSIS — F419 Anxiety disorder, unspecified: Secondary | ICD-10-CM | POA: Diagnosis present

## 2015-03-04 DIAGNOSIS — R4701 Aphasia: Secondary | ICD-10-CM | POA: Diagnosis not present

## 2015-03-04 DIAGNOSIS — G459 Transient cerebral ischemic attack, unspecified: Principal | ICD-10-CM | POA: Diagnosis present

## 2015-03-04 DIAGNOSIS — Z8669 Personal history of other diseases of the nervous system and sense organs: Secondary | ICD-10-CM | POA: Diagnosis not present

## 2015-03-04 DIAGNOSIS — R634 Abnormal weight loss: Secondary | ICD-10-CM

## 2015-03-04 DIAGNOSIS — I5022 Chronic systolic (congestive) heart failure: Secondary | ICD-10-CM | POA: Diagnosis present

## 2015-03-04 DIAGNOSIS — R4781 Slurred speech: Secondary | ICD-10-CM

## 2015-03-04 DIAGNOSIS — Z8711 Personal history of peptic ulcer disease: Secondary | ICD-10-CM

## 2015-03-04 DIAGNOSIS — I6789 Other cerebrovascular disease: Secondary | ICD-10-CM | POA: Diagnosis not present

## 2015-03-04 LAB — URINALYSIS, ROUTINE W REFLEX MICROSCOPIC
BILIRUBIN URINE: NEGATIVE
Glucose, UA: NEGATIVE mg/dL
Hgb urine dipstick: NEGATIVE
KETONES UR: NEGATIVE mg/dL
LEUKOCYTES UA: NEGATIVE
NITRITE: NEGATIVE
Protein, ur: NEGATIVE mg/dL
Specific Gravity, Urine: 1.006 (ref 1.005–1.030)
UROBILINOGEN UA: 0.2 mg/dL (ref 0.0–1.0)
pH: 6.5 (ref 5.0–8.0)

## 2015-03-04 LAB — COMPREHENSIVE METABOLIC PANEL
ALT: 31 U/L (ref 0–53)
AST: 31 U/L (ref 0–37)
Albumin: 4.7 g/dL (ref 3.5–5.2)
Alkaline Phosphatase: 65 U/L (ref 39–117)
Anion gap: 11 (ref 5–15)
BUN: 23 mg/dL (ref 6–23)
CALCIUM: 9.4 mg/dL (ref 8.4–10.5)
CO2: 25 mmol/L (ref 19–32)
Chloride: 102 mmol/L (ref 96–112)
Creatinine, Ser: 1.05 mg/dL (ref 0.50–1.35)
GFR calc non Af Amer: 79 mL/min — ABNORMAL LOW (ref >90)
GLUCOSE: 103 mg/dL — AB (ref 70–99)
Potassium: 4 mmol/L (ref 3.5–5.1)
Sodium: 138 mmol/L (ref 135–145)
Total Bilirubin: 0.8 mg/dL (ref 0.3–1.2)
Total Protein: 7.6 g/dL (ref 6.0–8.3)

## 2015-03-04 LAB — CMP (CANCER CENTER ONLY)
ALK PHOS: 64 U/L (ref 26–84)
ALT(SGPT): 29 U/L (ref 10–47)
AST: 31 U/L (ref 11–38)
Albumin: 3.9 g/dL (ref 3.3–5.5)
BUN, Bld: 20 mg/dL (ref 7–22)
CO2: 25 mEq/L (ref 18–33)
Calcium: 8.6 mg/dL (ref 8.0–10.3)
Chloride: 104 mEq/L (ref 98–108)
Creat: 1.1 mg/dl (ref 0.6–1.2)
Glucose, Bld: 187 mg/dL — ABNORMAL HIGH (ref 73–118)
POTASSIUM: 4.4 meq/L (ref 3.3–4.7)
Sodium: 137 mEq/L (ref 128–145)
Total Bilirubin: 0.8 mg/dl (ref 0.20–1.60)
Total Protein: 6.6 g/dL (ref 6.4–8.1)

## 2015-03-04 LAB — CBC
HEMATOCRIT: 40.2 % (ref 39.0–52.0)
Hemoglobin: 14 g/dL (ref 13.0–17.0)
MCH: 33.1 pg (ref 26.0–34.0)
MCHC: 34.8 g/dL (ref 30.0–36.0)
MCV: 95 fL (ref 78.0–100.0)
Platelets: 243 10*3/uL (ref 150–400)
RBC: 4.23 MIL/uL (ref 4.22–5.81)
RDW: 13.6 % (ref 11.5–15.5)
WBC: 7.6 10*3/uL (ref 4.0–10.5)

## 2015-03-04 LAB — DIFFERENTIAL
BASOS ABS: 0 10*3/uL (ref 0.0–0.1)
Basophils Relative: 0 % (ref 0–1)
EOS ABS: 0 10*3/uL (ref 0.0–0.7)
EOS PCT: 0 % (ref 0–5)
LYMPHS ABS: 0.5 10*3/uL — AB (ref 0.7–4.0)
Lymphocytes Relative: 7 % — ABNORMAL LOW (ref 12–46)
Monocytes Absolute: 0.9 10*3/uL (ref 0.1–1.0)
Monocytes Relative: 11 % (ref 3–12)
NEUTROS PCT: 82 % — AB (ref 43–77)
Neutro Abs: 6.2 10*3/uL (ref 1.7–7.7)

## 2015-03-04 LAB — I-STAT TROPONIN, ED: TROPONIN I, POC: 0 ng/mL (ref 0.00–0.08)

## 2015-03-04 LAB — APTT: aPTT: 26 seconds (ref 24–37)

## 2015-03-04 LAB — RAPID URINE DRUG SCREEN, HOSP PERFORMED
AMPHETAMINES: NOT DETECTED
Barbiturates: NOT DETECTED
Benzodiazepines: NOT DETECTED
Cocaine: NOT DETECTED
Opiates: NOT DETECTED
Tetrahydrocannabinol: NOT DETECTED

## 2015-03-04 LAB — ETHANOL: Alcohol, Ethyl (B): <5 mg/dL (ref 0–9)

## 2015-03-04 LAB — PROTIME-INR
INR: 1.08 (ref 0.00–1.49)
Prothrombin Time: 14.1 seconds (ref 11.6–15.2)

## 2015-03-04 MED ORDER — CITALOPRAM HYDROBROMIDE 10 MG PO TABS
20.0000 mg | ORAL_TABLET | Freq: Every day | ORAL | Status: DC
  Administered 2015-03-05: 20 mg via ORAL
  Filled 2015-03-04: qty 2

## 2015-03-04 MED ORDER — HEPARIN SODIUM (PORCINE) 5000 UNIT/ML IJ SOLN
5000.0000 [IU] | Freq: Three times a day (TID) | INTRAMUSCULAR | Status: DC
  Administered 2015-03-05 (×3): 5000 [IU] via SUBCUTANEOUS
  Filled 2015-03-04 (×7): qty 1

## 2015-03-04 MED ORDER — ALPRAZOLAM 0.5 MG PO TABS
1.0000 mg | ORAL_TABLET | Freq: Once | ORAL | Status: AC | PRN
  Administered 2015-03-04: 1 mg via ORAL
  Filled 2015-03-04: qty 2

## 2015-03-04 MED ORDER — ASPIRIN 325 MG PO TABS
325.0000 mg | ORAL_TABLET | Freq: Every day | ORAL | Status: DC
  Administered 2015-03-05: 325 mg via ORAL
  Filled 2015-03-04 (×2): qty 1

## 2015-03-04 MED ORDER — VITAMIN B-6 50 MG PO TABS
250.0000 mg | ORAL_TABLET | Freq: Every day | ORAL | Status: DC
  Administered 2015-03-05 (×2): 250 mg via ORAL
  Filled 2015-03-04 (×4): qty 1

## 2015-03-04 MED ORDER — STROKE: EARLY STAGES OF RECOVERY BOOK
Freq: Once | Status: AC
  Administered 2015-03-04: 22:00:00
  Filled 2015-03-04: qty 1

## 2015-03-04 MED ORDER — ACETAMINOPHEN 325 MG PO TABS: 325.0000 mg | ORAL_TABLET | Freq: Four times a day (QID) | ORAL | Status: DC | PRN

## 2015-03-04 MED ORDER — LORAZEPAM 0.5 MG PO TABS: 0.5000 mg | ORAL_TABLET | Freq: Two times a day (BID) | ORAL | Status: DC | PRN

## 2015-03-04 MED ORDER — ALPRAZOLAM 0.5 MG PO TABS: 0.5000 mg | ORAL_TABLET | Freq: Every evening | ORAL | Status: DC | PRN

## 2015-03-04 MED ORDER — IBUPROFEN 200 MG PO TABS: 200.0000 mg | ORAL_TABLET | Freq: Four times a day (QID) | ORAL | Status: DC | PRN

## 2015-03-04 NOTE — ED Notes (Signed)
[facility] called.  [facility] advised that they would put patient on the active list but that they were very busy at this time and would be arriving as soon as they were able.

## 2015-03-04 NOTE — ED Notes (Signed)
Pt returned for MRI

## 2015-03-04 NOTE — Telephone Encounter (Signed)
Patient complaining of pale stools. Some changes in diet but nothing obvious. No other symptoms. Spoke with Laverna Peace NP. Told patient he could come in for blood test to look at liver function if he wanted some reassurance. Patient declined. Feel like changes in color related to diet. If symptoms persist he can call the office back on Monday. If other symptoms presented, such as abdominal bloating, pain, N/V patient told to call on-call service or seek assessment at the ED. Patient in agreement.

## 2015-03-04 NOTE — H&P (Signed)
[facility] History and Physical  Brian [name] MHD:[id number] DOB: 1961-01-15 DOA: 03/04/2015  Referring physician: EDP PCP: Jenny Reichmann, MD   Chief Complaint: Slurred speech   HPI: Brian [name] is a 54 y.o. male h/o hodgkins disease, on chemotherapy.  Patient presents to ED with an episode of slurred speech that occurred earlier today.  Episode was very short, lasting only a couple of mins at most.  Patient did fall and hit his head last week while walking in woods.  Symptoms completely resolved shortly after onset.  No new numbness nor weakness in any of his muscles or extremities with these symptoms.  Does have a chronic Bell's Palsy.  Patient presents to EDfor symptoms.  Review of Systems: Systems reviewed.  As above, otherwise negative  Past Medical History  Diagnosis Date  . Wears contact lenses   . H/O autologous stem cell transplant     may 2014  at [facility]  . History of peptic ulcer   . Mass of right inguinal region   . History of Bell's palsy     2009  RIGHT SIDE--  HAS 80% FUNCTION / PT STATES A LITTLE ASYMETRICAL AND EFFECTS MOUTH  . Hodgkin's disease, nodular sclerosis, of inguinal region/lower limb ONOLOGIST--  DR [facility] AND A [facility]      [facility] CHEMO 2013/  AUTOLOGUS STEM CELL TRANSPLANT MAY 2014 AT [facility]  . Dysrhythmia     past hx pvc  . Anxiety   . PVC (premature ventricular contraction)     "benign"   Past Surgical History  Procedure Laterality Date  . Axillary lymph node biopsy Left 02/02/2013    Procedure: NEEDLE LOCALIZED AXILLARY LYMPH NODE BIOPSY;  Surgeon: Haywood Lasso, MD;  Location: [facility];  Service: General;  Laterality: Left;  . Cyst removal neck Left 1980  . Removal right inguinal lymph nodes  08-16-2011  . Left inguinal lymph node bx  09-09-2012  . Transthoracic echocardiogram  03-18-2013    MILD LVH/  EF 55-60%  . Scrotal exploration Right 01/04/2014    Procedure: SCROTUM EXPLORATION   INGUINAL , EXCISION OF CYSTIC MASS  OF RIGHT SPERMATIC CORD, WITH FROZEN SECTION;  Surgeon: Franchot Gallo, MD;  Location[facility];  Service: Urology;  Laterality: Right;  . Pleura biopsy Left 05/31/2014  . Video assisted thoracoscopy (vats)/wedge resection  05/31/2014  . Bone marrow biopsy  08/2012  . Video assisted thoracoscopy Left 05/31/2014    Procedure: LEFT VIDEO ASSISTED THORACOSCOPY, PLEURAL BIOPSY;  Surgeon: Melrose Nakayama, MD;  Location: [facility];  Service: Thoracic;  Laterality: Left;   Social History:  reports that he has never smoked. He has never used smokeless tobacco. He reports that he drinks about 2.4 oz of alcohol per week. He reports that he does not use illicit drugs.  No Known Allergies  Family History  Problem Relation Age of Onset  . Cancer Mother 90    ovarian  . Heart disease Father   . Stroke Father   . Cancer Sister     ovarian  . Cancer Brother     testicular     Prior to Admission medications   Medication Sig Start Date End Date Taking? Authorizing Provider  acetaminophen (TYLENOL) 325 MG tablet Take 325 mg by mouth every 6 (six) hours as needed for mild pain.   Yes Historical Provider, MD  ALPRAZolam Duanne Moron) 0.5 MG tablet TAKE 1 TABLET BY MOUTH AT BEDTIME AS NEEDED FOR SLEEP 01/28/15  Yes Darlyne Russian, MD  Cholecalciferol (VITAMIN D-3) 1000 UNITS CAPS Take by mouth 2 (two) times daily.   Yes Historical Provider, MD  citalopram (CELEXA) 20 MG tablet Take 1 tablet (20 mg total) by mouth daily. 09/08/14  Yes [name], MD  dexamethasone (DECADRON) 4 MG tablet Take 2 tablets (8 mg total) by mouth 2 (two) times daily with a meal. Start the day after chemotherapy for 2 days. Take with food. 11/30/14  Yes [name], MD  ibuprofen (ADVIL,MOTRIN) 200 MG tablet Take 200-400 mg by mouth every 6 (six) hours as needed for mild pain.    Yes Historical Provider, MD  lidocaine-prilocaine (EMLA) cream Apply 1 application topically as needed. Apply nickel sized amount to  portacath site at least 1 hour prior to chemotherapy. 06/11/14  Yes [name], MD  LORazepam (ATIVAN) 1 MG tablet TAKE 1/2 A TABLET BY MOUTH TWICE DAILY DURING THE DAY FOR ANXIETY, AND 1 TABLET AT BEDTIME IF NEEDED 10/26/14  Yes Darlyne Russian, MD  Multiple Vitamin (MULTIVITAMIN) tablet Take 1 tablet by mouth daily.   Yes Historical Provider, MD  ondansetron (ZOFRAN) 8 MG tablet Take 1 tablet (8 mg total) by mouth 2 (two) times daily. Start the day after chemo for 2 days. Then take as needed for nausea or vomiting. 06/16/14  Yes [name], MD  prochlorperazine (COMPAZINE) 10 MG tablet Take 1 tablet (10 mg total) by mouth every 6 (six) hours as needed (Nausea or vomiting). 06/16/14  Yes [name], MD  Pyridoxine HCl (VITAMIN B-6) 250 MG tablet Take 1 tablet (250 mg total) by mouth daily. 11/11/14  Yes [name], MD   Physical Exam: Filed Vitals:   03/04/15 2005  BP: 143/86  Pulse: 85  Temp: 98 F (36.7 C)  Resp: 18    BP 143/86 mmHg  Pulse 85  Temp(Src) 98 F (36.7 C) (Oral)  Resp 18  SpO2 96%  General Appearance:    Alert, oriented, no distress, appears stated age  Head:    Normocephalic, atraumatic  Eyes:    PERRL, EOMI, sclera non-icteric        Nose:   Nares without drainage or epistaxis. Mucosa, turbinates normal  Throat:   Moist mucous membranes. Oropharynx without erythema or exudate.  Neck:   Supple. No carotid bruits.  No thyromegaly.  No lymphadenopathy.   Back:     No CVA tenderness, no spinal tenderness  Lungs:     Clear to auscultation bilaterally, without wheezes, rhonchi or rales  Chest wall:    No tenderness to palpitation  Heart:    Regular rate and rhythm without murmurs, gallops, rubs  Abdomen:     Soft, non-tender, nondistended, normal bowel sounds, no organomegaly  Genitalia:    deferred  Rectal:    deferred  Extremities:   No clubbing, cyanosis or edema.  Pulses:   2+ and symmetric all extremities  Skin:   Skin color, texture, turgor  normal, no rashes or lesions  Lymph nodes:   Cervical, supraclavicular, and axillary nodes normal  Neurologic:   R sided facial weakness due to Bell's palsy, otherwise neurologically intact.    Labs on Admission:  Basic Metabolic Panel:  Recent Labs Lab 03/04/15 1443 03/04/15 1908  NA 137 138  K 4.4 4.0  CL 104 102  CO2 25 25  GLUCOSE 187* 103*  BUN 20 23  CREATININE 1.1 1.05  CALCIUM 8.6 9.4   Liver Function Tests:  Recent Labs Lab 03/04/15 1443 03/04/15 1908  AST 31 31  ALT 29 31  ALKPHOS 64 65  BILITOT 0.80 0.8  PROT 6.6 7.6  ALBUMIN  --  4.7   No results for input(s): LIPASE, AMYLASE in the last 168 hours. No results for input(s): AMMONIA in the last 168 hours. CBC:  Recent Labs Lab 03/04/15 1908  WBC 7.6  NEUTROABS 6.2  HGB 14.0  HCT 40.2  MCV 95.0  PLT 243   Cardiac Enzymes: No results for input(s): CKTOTAL, CKMB, CKMBINDEX, TROPONINI in the last 168 hours.  BNP (last 3 results) No results for input(s): PROBNP in the last 8760 hours. CBG: No results for input(s): GLUCAP in the last 168 hours.  Radiological Exams on Admission: Ct Head Wo Contrast  03/04/2015   CLINICAL DATA:  Acute slurred speech today. History of Hodgkin's lymphoma.  EXAM: CT HEAD WITHOUT CONTRAST  TECHNIQUE: Contiguous axial images were obtained from the base of the skull through the vertex without intravenous contrast.  COMPARISON:  06/12/2014 brain MR  FINDINGS: No intracranial abnormalities are identified, including mass lesion or mass effect, hydrocephalus, extra-axial fluid collection, midline shift, hemorrhage, or acute infarction.  The visualized bony calvarium is unremarkable.  IMPRESSION: Unremarkable noncontrast head CT.   Electronically Signed   By: Margarette Canada M.D.   On: 03/04/2015 19:37    EKG: Independently reviewed.  Assessment/Plan Principal Problem:   Slurred speech Active Problems:   H/O Bell's palsy   Aphasia   1. Slurred speech - seems less likely to be  TIA given ultra short duration of ~1 min of symptoms, but neurology wants workup. 1. Will go ahead and order TIA work up at recommendation of neurology 2. Neurology consulted by EDP and requests admission to [facility] for evaluation 3. MRI / MRA ordered 4. 2d echo and carotid duplex ordered 5. ASA 325 6. Lipids and A1C 2. Hodgkins disease - on chemo with Adcetris   Code Status: Full  Family Communication: Family at bedside Disposition Plan: Admit to inpatient   Time spent: 26 min  GARDNER, JARED M. [facility] Pager [phone]  If 7AM-7PM, please contact the day team taking care of the patient [email or web address] Password [facility] 03/04/2015, 9:19 PM

## 2015-03-04 NOTE — Consult Note (Signed)
Consult Reason for Consult: transient expressive aphasia/slurred speech Referring Physician: Dr Lamont Snowball ED  CC: transient expressive aphasia and slurred speech  HPI: Brian [name] is an 54 y.o. 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 Episode lasted 1-2 minutes and he then returned to baseline. No associated motor or sensory symptoms. No speech difficulties. No abnormal movements, oral automatisms. 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 He currently takes Adcetris for his Hodgkins. Patient and spouse express concern over possible PML which is a risk of Adcetris. He notes being under a lot of stress recently.   CT head completed in the ED, imaging reviewed, overall unremarkable. Lab workup unremarkable in ED.   Past Medical History  Diagnosis Date  . Wears contact lenses   . H/O autologous stem cell transplant     may 2014  at [facility]  . History of peptic ulcer   . Mass of right inguinal region   . History of Bell's palsy     2009  RIGHT SIDE--  HAS 80% FUNCTION / PT STATES A LITTLE ASYMETRICAL AND EFFECTS MOUTH  . Hodgkin's disease, nodular sclerosis, of inguinal region/lower limb ONOLOGIST--  [facility] AND A [facility]      SALVAGE CHEMO 2013/  AUTOLOGUS STEM CELL TRANSPLANT MAY 2014 AT [facility]  . Dysrhythmia     past hx pvc  . Anxiety   . PVC (premature ventricular contraction)     "benign"    Past Surgical History  Procedure Laterality Date  . Axillary lymph node biopsy Left 02/02/2013    Procedure: NEEDLE LOCALIZED AXILLARY LYMPH NODE BIOPSY;  Surgeon: Haywood Lasso, MD;  Location: [place];  Service: General;  Laterality: Left;  . Cyst removal neck Left 1980  . Removal right inguinal lymph nodes  08-16-2011  . Left inguinal lymph node bx  09-09-2012  . Transthoracic echocardiogram  03-18-2013    MILD LVH/  EF 55-60%  . Scrotal exploration Right  01/04/2014    Procedure: SCROTUM EXPLORATION   INGUINAL , EXCISION OF CYSTIC MASS OF RIGHT SPERMATIC CORD, WITH FROZEN SECTION;  Surgeon: Franchot Gallo, MD;  Location: [facility];  Service: Urology;  Laterality: Right;  . Pleura biopsy Left 05/31/2014  . Video assisted thoracoscopy (vats)/wedge resection  05/31/2014  . Bone marrow biopsy  08/2012  . Video assisted thoracoscopy Left 05/31/2014    Procedure: LEFT VIDEO ASSISTED THORACOSCOPY, PLEURAL BIOPSY;  Surgeon: Melrose Nakayama, MD;  Location: [place];  Service: Thoracic;  Laterality: Left;    Family History  Problem Relation Age of Onset  . Cancer Mother 23    ovarian  . Heart disease Father   . Stroke Father   . Cancer Sister     ovarian  . Cancer Brother     testicular    Social History:  reports that he has never smoked. He has never used smokeless tobacco. He reports that he drinks about 2.4 oz of alcohol per week. He reports that he does not use illicit drugs.  No Known Allergies  Medications:  Scheduled: .  stroke: mapping our early stages of recovery book   Does not apply Once  . aspirin  325 mg Oral Daily  . citalopram  20 mg Oral Daily  . heparin  5,000 Units Subcutaneous 3 times per day  . vitamin B-6  250 mg Oral Daily  ROS: Out of a complete 14 system review, the patient complains of only the following symptoms, and all other reviewed systems are negative. Denies any + ROS  Physical Examination: Filed Vitals:   03/04/15 2005  BP: 143/86  Pulse: 85  Temp: 98 F (36.7 C)  Resp: 18   Physical Exam  Constitutional: He appears well-developed and well-nourished.  Psych: Affect appropriate to situation Eyes: No scleral injection HENT: No OP obstrucion Head: Normocephalic.  Cardiovascular: Normal rate and regular rhythm.  Respiratory: Effort normal and breath sounds normal.  GI: Soft. Bowel sounds are normal. No distension. There is no tenderness.  Skin: WDI  Neurologic  Examination Mental Status: Alert, oriented, thought content appropriate.  Speech fluent without evidence of aphasia.  Able to follow 3 step commands without difficulty. Cranial Nerves: II: funduscopic exam wnl bilaterally, visual fields grossly normal, pupils equal, round, reactive to light and accommodation III,IV, VI: ptosis not present, extra-ocular motions intact bilaterally V,VII: smile symmetric, facial light touch sensation normal bilaterally VIII: hearing normal bilaterally IX,X: gag reflex present XI: trapezius strength/neck flexion strength normal bilaterally XII: tongue strength normal  Motor: Right : Upper extremity    Left:     Upper extremity 5/5 deltoid       5/5 deltoid 5/5 biceps      5/5 biceps  5/5 triceps      5/5 triceps 5/5 hand grip      5/5 hand grip  Lower extremity     Lower extremity 5/5 hip flexor      5/5 hip flexor 5/5 quadricep      5/5 quadriceps  5/5 hamstrings     5/5 hamstrings 5/5 plantar flexion       5/5 plantar flexion 5/5 plantar extension     5/5 plantar extension Tone and bulk:normal tone throughout; no atrophy noted Sensory: Pinprick and light touch intact throughout, bilaterally Deep Tendon Reflexes: 2+ and symmetric throughout Plantars: Right: downgoing   Left: downgoing Cerebellar: Normal FTN and normal heel-to-shin test Gait: deferred  Laboratory Studies:   Basic Metabolic Panel:  Recent Labs Lab 03/04/15 1443 03/04/15 1908  NA 137 138  K 4.4 4.0  CL 104 102  CO2 25 25  GLUCOSE 187* 103*  BUN 20 23  CREATININE 1.1 1.05  CALCIUM 8.6 9.4    Liver Function Tests:  Recent Labs Lab 03/04/15 1443 03/04/15 1908  AST 31 31  ALT 29 31  ALKPHOS 64 65  BILITOT 0.80 0.8  PROT 6.6 7.6  ALBUMIN  --  4.7   No results for input(s): LIPASE, AMYLASE in the last 168 hours. No results for input(s): AMMONIA in the last 168 hours.  CBC:  Recent Labs Lab 03/04/15 1908  WBC 7.6  NEUTROABS 6.2  HGB 14.0  HCT 40.2  MCV  95.0  PLT 243    Cardiac Enzymes: No results for input(s): CKTOTAL, CKMB, CKMBINDEX, TROPONINI in the last 168 hours.  BNP: Invalid input(s): POCBNP  CBG: No results for input(s): GLUCAP in the last 168 hours.  Microbiology: Results for orders placed or performed during the hospital encounter of 05/27/14  Surgical pcr screen     Status: None   Collection Time: 05/27/14 10:18 AM  Result Value Ref Range Status   MRSA, PCR NEGATIVE NEGATIVE Final   Staphylococcus aureus NEGATIVE NEGATIVE Final    Comment:        The Xpert SA Assay (FDA approved for NASAL specimens in patients over 35 years of age), is one  component of a comprehensive surveillance program.  Test performance has been validated by [facility] for patients greater than or equal to 74 year old. It is not intended to diagnose infection nor to guide or monitor treatment.    Coagulation Studies:  Recent Labs  03/04/15 1908  LABPROT 14.1  INR 1.08    Urinalysis:  Recent Labs Lab 03/04/15 2042  COLORURINE YELLOW  LABSPEC 1.006  PHURINE 6.5  GLUCOSEU NEGATIVE  HGBUR NEGATIVE  BILIRUBINUR NEGATIVE  KETONESUR NEGATIVE  PROTEINUR NEGATIVE  UROBILINOGEN 0.2  NITRITE NEGATIVE  LEUKOCYTESUR NEGATIVE    Lipid Panel:     Component Value Date/Time   CHOL 178 02/02/2014 1017   TRIG 97 02/02/2014 1017   HDL 44 02/02/2014 1017   CHOLHDL 4.0 02/02/2014 1017   VLDL 19 02/02/2014 1017   LDLCALC 115* 02/02/2014 1017    HgbA1C: No results found for: HGBA1C  Urine Drug Screen:     Component Value Date/Time   LABOPIA NONE DETECTED 03/04/2015 2042   COCAINSCRNUR NONE DETECTED 03/04/2015 2042   LABBENZ NONE DETECTED 03/04/2015 2042   AMPHETMU NONE DETECTED 03/04/2015 2042   THCU NONE DETECTED 03/04/2015 2042   LABBARB NONE DETECTED 03/04/2015 2042    Alcohol Level:  Recent Labs Lab 03/04/15 1908  ETH <5     Imaging: Ct Head Wo Contrast  03/04/2015   CLINICAL DATA:  Acute slurred speech today.  History of Hodgkin's lymphoma.  EXAM: CT HEAD WITHOUT CONTRAST  TECHNIQUE: Contiguous axial images were obtained from the base of the skull through the vertex without intravenous contrast.  COMPARISON:  06/12/2014 brain MR  FINDINGS: No intracranial abnormalities are identified, including mass lesion or mass effect, hydrocephalus, extra-axial fluid collection, midline shift, hemorrhage, or acute infarction.  The visualized bony calvarium is unremarkable.  IMPRESSION: Unremarkable noncontrast head CT.   Electronically Signed   By: Margarette Canada M.D.   On: 03/04/2015 19:37     Assessment/Plan:  54y/o gentleman with history of Hodgkins lymphoma on Adcetra presenting for transient episode of slurred speech and questionable word finding difficulties. Unclear etiology. TIA in the differential but low suspicion for this. Partial seizure also in the differential. Early Chars has risk of PML but this is unlikely based on the description. Suspect stress/anxiety may be playing a role.   -check MRI brain, 2D echo and carotid doppler -EEG -suggest starting ASA 21m daily -if unremarkable no further inpatient neurological workup indicated.   PJim Like DO [facility] [phone] If 7pm- 7am, please page neurology on call as listed in AMION. 03/04/2015, 9:57 PM

## 2015-03-04 NOTE — ED Notes (Signed)
Nurse getting labs 

## 2015-03-04 NOTE — ED Notes (Addendum)
Pt was talking to cousin little after 5pm and had episode of slurred speech, then resolved itself.  Pt has no new facial deficits at this time no any slurred speech, grips equal and very strong. Pt ambulated from lobby into room with steady gait..  Pt has PMH Bells Palsy.

## 2015-03-04 NOTE — ED Provider Notes (Signed)
CSN: [id number]     Arrival date & time 03/04/15  1750 History   First MD Initiated Contact with Patient 03/04/15 1824     Chief Complaint  Patient presents with  . Aphasia     (Consider location/radiation/quality/duration/timing/severity/associated sxs/prior Treatment) HPI Comments: Patient with past medical history of Hodgkin's, Bell's palsy, and PVCs presents to the emergency department with chief complaint of a aphasia. Patient states that he had one episode of a facial today at around 5:00. States that the symptoms lasted for 1-2 minutes. States he was unable to say anything clearly, the speech was not discernible.  Patient states that the symptoms resolved after he sat down. He denies any new numbness, weakness, or tingling of the extremities. Denies any changes in vision.  Denies any difficulty ambulating or balancing.  The history is provided by the patient. No language interpreter was used.    Past Medical History  Diagnosis Date  . Wears contact lenses   . H/O autologous stem cell transplant     may 2014  at [facility]  . History of peptic ulcer   . Mass of right inguinal region   . History of Bell's palsy     2009  RIGHT SIDE--  HAS 80% FUNCTION / PT STATES A LITTLE ASYMETRICAL AND EFFECTS MOUTH  . Hodgkin's disease, nodular sclerosis, of inguinal region/lower limb ONOLOGIST--  DR [facility] AND A [facility]      SALVAGE CHEMO 2013/  AUTOLOGUS STEM CELL TRANSPLANT MAY 2014 AT [facility]  . Dysrhythmia     past hx pvc  . Anxiety   . PVC (premature ventricular contraction)     "benign"   Past Surgical History  Procedure Laterality Date  . Axillary lymph node biopsy Left 02/02/2013    Procedure: NEEDLE LOCALIZED AXILLARY LYMPH NODE BIOPSY;  Surgeon: Haywood Lasso, MD;  Location: [place];  Service: General;  Laterality: Left;  . Cyst removal neck Left 1980  . Removal right inguinal lymph nodes  08-16-2011  . Left inguinal lymph node bx  09-09-2012  . Transthoracic  echocardiogram  03-18-2013    MILD LVH/  EF 55-60%  . Scrotal exploration Right 01/04/2014    Procedure: SCROTUM EXPLORATION   INGUINAL , EXCISION OF CYSTIC MASS OF RIGHT SPERMATIC CORD, WITH FROZEN SECTION;  Surgeon: Franchot Gallo, MD;  Location: [facility];  Service: Urology;  Laterality: Right;  . Pleura biopsy Left 05/31/2014  . Video assisted thoracoscopy (vats)/wedge resection  05/31/2014  . Bone marrow biopsy  08/2012  . Video assisted thoracoscopy Left 05/31/2014    Procedure: LEFT VIDEO ASSISTED THORACOSCOPY, PLEURAL BIOPSY;  Surgeon: Melrose Nakayama, MD;  Location: [place];  Service: Thoracic;  Laterality: Left;   Family History  Problem Relation Age of Onset  . Cancer Mother 79    ovarian  . Heart disease Father   . Stroke Father   . Cancer Sister     ovarian  . Cancer Brother     testicular   History  Substance Use Topics  . Smoking status: Never Smoker   . Smokeless tobacco: Never Used     Comment: never smoked tobacco  . Alcohol Use: 2.4 oz/week    4 Cans of beer per week    Review of Systems  Constitutional: Negative for fever and chills.  Respiratory: Negative for shortness of breath.   Cardiovascular: Negative for chest pain.  Gastrointestinal: Negative for nausea, vomiting, diarrhea and constipation.  Genitourinary: Negative for dysuria.  Neurological: Positive for speech difficulty.  All other systems reviewed and are negative.     Allergies  Review of patient's allergies indicates no known allergies.  Home Medications   Prior to Admission medications   Medication Sig Start Date End Date Taking? Authorizing Provider  acetaminophen (TYLENOL) 325 MG tablet Take 325 mg by mouth every 6 (six) hours as needed for mild pain.   Yes Historical Provider, MD  ALPRAZolam Duanne Moron) 0.5 MG tablet TAKE 1 TABLET BY MOUTH AT BEDTIME AS NEEDED FOR SLEEP 01/28/15  Yes Darlyne Russian, MD  Cholecalciferol (VITAMIN D-3) 1000 UNITS CAPS Take by mouth 2 (two)  times daily.   Yes Historical Provider, MD  citalopram (CELEXA) 20 MG tablet Take 1 tablet (20 mg total) by mouth daily. 09/08/14  Yes [name], MD  dexamethasone (DECADRON) 4 MG tablet Take 2 tablets (8 mg total) by mouth 2 (two) times daily with a meal. Start the day after chemotherapy for 2 days. Take with food. 11/30/14  Yes [name], MD  ibuprofen (ADVIL,MOTRIN) 200 MG tablet Take 200-400 mg by mouth every 6 (six) hours as needed for mild pain.    Yes Historical Provider, MD  lidocaine-prilocaine (EMLA) cream Apply 1 application topically as needed. Apply nickel sized amount to portacath site at least 1 hour prior to chemotherapy. 06/11/14  Yes [name], MD  LORazepam (ATIVAN) 1 MG tablet TAKE 1/2 A TABLET BY MOUTH TWICE DAILY DURING THE DAY FOR ANXIETY, AND 1 TABLET AT BEDTIME IF NEEDED 10/26/14  Yes Darlyne Russian, MD  Multiple Vitamin (MULTIVITAMIN) tablet Take 1 tablet by mouth daily.   Yes Historical Provider, MD  ondansetron (ZOFRAN) 8 MG tablet Take 1 tablet (8 mg total) by mouth 2 (two) times daily. Start the day after chemo for 2 days. Then take as needed for nausea or vomiting. 06/16/14  Yes [name], MD  prochlorperazine (COMPAZINE) 10 MG tablet Take 1 tablet (10 mg total) by mouth every 6 (six) hours as needed (Nausea or vomiting). 06/16/14  Yes [name], MD  Pyridoxine HCl (VITAMIN B-6) 250 MG tablet Take 1 tablet (250 mg total) by mouth daily. 11/11/14  Yes [name], MD  lidocaine (LIDODERM) 5 % Place 1 patch onto the skin daily. Remove & Discard patch within 12 hours or as directed by MD 06/25/14   [name], MD   BP 156/59 mmHg  Pulse 88  Temp(Src) 98.6 F (37 C) (Oral)  Resp 17  SpO2 100% Physical Exam  Constitutional: He is oriented to person, place, and time. He appears well-developed and well-nourished.  HENT:  Head: Normocephalic and atraumatic.  Eyes: Conjunctivae and EOM are normal. Pupils are equal, round, and reactive to  light. Right eye exhibits no discharge. Left eye exhibits no discharge. No scleral icterus.  Neck: Normal range of motion. Neck supple. No JVD present.  Cardiovascular: Normal rate, regular rhythm and normal heart sounds.  Exam reveals no gallop and no friction rub.   No murmur heard. Pulmonary/Chest: Effort normal and breath sounds normal. No respiratory distress. He has no wheezes. He has no rales. He exhibits no tenderness.  Abdominal: Soft. He exhibits no distension and no mass. There is no tenderness. There is no rebound and no guarding.  Musculoskeletal: Normal range of motion. He exhibits no edema or tenderness.  Neurological: He is alert and oriented to person, place, and time.  CN III-12 intact, patient has some right-sided residual facial weakness  secondary to Bell's palsy, no pronator drift, speech is clear, normal finger to nose, normal heel to shin,   Skin: Skin is warm and dry.  Psychiatric: He has a normal mood and affect. His behavior is normal. Judgment and thought content normal.  Nursing note and vitals reviewed.   ED Course  Procedures (including critical care time) Results for orders placed or performed during the hospital encounter of 03/04/15  Ethanol  Result Value Ref Range   Alcohol, Ethyl (B) <5 0 - 9 mg/dL  Protime-INR  Result Value Ref Range   Prothrombin Time 14.1 11.6 - 15.2 seconds   INR 1.08 0.00 - 1.49  APTT  Result Value Ref Range   aPTT 26 24 - 37 seconds  CBC  Result Value Ref Range   WBC 7.6 4.0 - 10.5 K/uL   RBC 4.23 4.22 - 5.81 MIL/uL   Hemoglobin 14.0 13.0 - 17.0 g/dL   HCT 40.2 39.0 - 52.0 %   MCV 95.0 78.0 - 100.0 fL   MCH 33.1 26.0 - 34.0 pg   MCHC 34.8 30.0 - 36.0 g/dL   RDW 13.6 11.5 - 15.5 %   Platelets 243 150 - 400 K/uL  Differential  Result Value Ref Range   Neutrophils Relative % 82 (H) 43 - 77 %   Neutro Abs 6.2 1.7 - 7.7 K/uL   Lymphocytes Relative 7 (L) 12 - 46 %   Lymphs Abs 0.5 (L) 0.7 - 4.0 K/uL   Monocytes Relative  11 3 - 12 %   Monocytes Absolute 0.9 0.1 - 1.0 K/uL   Eosinophils Relative 0 0 - 5 %   Eosinophils Absolute 0.0 0.0 - 0.7 K/uL   Basophils Relative 0 0 - 1 %   Basophils Absolute 0.0 0.0 - 0.1 K/uL  Comprehensive metabolic panel  Result Value Ref Range   Sodium 138 135 - 145 mmol/L   Potassium 4.0 3.5 - 5.1 mmol/L   Chloride 102 96 - 112 mmol/L   CO2 25 19 - 32 mmol/L   Glucose, Bld 103 (H) 70 - 99 mg/dL   BUN 23 6 - 23 mg/dL   Creatinine, Ser 1.05 0.50 - 1.35 mg/dL   Calcium 9.4 8.4 - 10.5 mg/dL   Total Protein 7.6 6.0 - 8.3 g/dL   Albumin 4.7 3.5 - 5.2 g/dL   AST 31 0 - 37 U/L   ALT 31 0 - 53 U/L   Alkaline Phosphatase 65 39 - 117 U/L   Total Bilirubin 0.8 0.3 - 1.2 mg/dL   GFR calc non Af Amer 79 (L) >90 mL/min   GFR calc Af Amer >90 >90 mL/min   Anion gap 11 5 - 15  I-Stat Troponin, ED (not at [facility])  Result Value Ref Range   Troponin i, poc 0.00 0.00 - 0.08 ng/mL   Comment 3           Ct Head Wo Contrast  03/04/2015   CLINICAL DATA:  Acute slurred speech today. History of Hodgkin's lymphoma.  EXAM: CT HEAD WITHOUT CONTRAST  TECHNIQUE: Contiguous axial images were obtained from the base of the skull through the vertex without intravenous contrast.  COMPARISON:  06/12/2014 brain MR  FINDINGS: No intracranial abnormalities are identified, including mass lesion or mass effect, hydrocephalus, extra-axial fluid collection, midline shift, hemorrhage, or acute infarction.  The visualized bony calvarium is unremarkable.  IMPRESSION: Unremarkable noncontrast head CT.   Electronically Signed   By: Margarette Canada M.D.   On: 03/04/2015  19:37     Imaging Review No results found.   EKG Interpretation None      MDM   Final diagnoses:  Aphasia    Patient with an episode of aphasia today at 5:00.  Symptoms lasted 1-2 minutes.  Have now completely resolved.  Discussed immediately with Dr. Johnney Killian, will not activate code stroke 2/2 resolution of symptoms, but will proceed with TIA workup  and neurology consultation.  Patient seen by and discussed with Dr. Johnney Killian, who agrees with plan.  CT and labs are negative. Dr. Johnney Killian discussed patient with Dr. [name] of neurology, who recommends transfer to account for MRI and further TIA workup. Patient agrees the plan.  Patient discussed with Dr. Alcario Drought, appreciate his help in admitting patient to [facility].   Montine Circle, PA-C 03/04/15 [id number]  Charlesetta Shanks, MD 03/04/15 2106

## 2015-03-05 ENCOUNTER — Inpatient Hospital Stay (HOSPITAL_COMMUNITY): Payer: Commercial Managed Care - PPO

## 2015-03-05 DIAGNOSIS — I6789 Other cerebrovascular disease: Secondary | ICD-10-CM

## 2015-03-05 MED ORDER — ASPIRIN 81 MG PO CHEW: 81.0000 mg | CHEWABLE_TABLET | Freq: Every day | ORAL | Status: DC

## 2015-03-05 MED ORDER — HEPARIN SOD (PORK) LOCK FLUSH 100 UNIT/ML IV SOLN
500.0000 [IU] | INTRAVENOUS | Status: AC | PRN
Start: 2015-03-05 — End: 2015-03-05
  Administered 2015-03-05: 500 [IU]

## 2015-03-05 MED ORDER — METOPROLOL TARTRATE 25 MG PO TABS: 12.5000 mg | ORAL_TABLET | Freq: Two times a day (BID) | ORAL | Status: DC

## 2015-03-05 MED ORDER — LISINOPRIL 2.5 MG PO TABS: 2.5000 mg | ORAL_TABLET | Freq: Every day | ORAL | Status: DC

## 2015-03-05 MED ORDER — METOPROLOL TARTRATE 12.5 MG HALF TABLET: 12.5000 mg | ORAL_TABLET | Freq: Two times a day (BID) | ORAL | Status: DC

## 2015-03-05 MED ORDER — ASPIRIN 81 MG PO CHEW
81.0000 mg | CHEWABLE_TABLET | Freq: Every day | ORAL | Status: DC
  Administered 2015-03-05: 81 mg via ORAL
  Filled 2015-03-05: qty 1

## 2015-03-05 MED ORDER — ATORVASTATIN CALCIUM 10 MG PO TABS
10.0000 mg | ORAL_TABLET | Freq: Every day | ORAL | Status: DC
  Filled 2015-03-05: qty 1

## 2015-03-05 NOTE — Discharge Summary (Signed)
Brian [name], is a 54 y.o. male  DOB 30-Jan-1961  MRN [id number].  Admission date:  03/04/2015  Admitting Physician  Etta Quill, DO  Discharge Date:  03/05/2015   Primary MD  Jenny Reichmann, MD  Recommendations for primary care physician for things to follow:   Check fasting lipid panel, A1c in a week.  Follow with cardiology and neurology on a close basis.   Admission Diagnosis  Aphasia [R47.01] TIA (transient ischemic attack) [G45.9]   Discharge Diagnosis  Aphasia [R47.01] TIA (transient ischemic attack) [G45.9]      Principal Problem:   Slurred speech Active Problems:   H/O Bell's palsy   Aphasia      Past Medical History  Diagnosis Date  . Wears contact lenses   . H/O autologous stem cell transplant     may 2014  at [facility]  . History of peptic ulcer   . Mass of right inguinal region   . History of Bell's palsy     2009  RIGHT SIDE--  HAS 80% FUNCTION / PT STATES A LITTLE ASYMETRICAL AND EFFECTS MOUTH  . Hodgkin's disease, nodular sclerosis, of inguinal region/lower limb ONOLOGIST--  DR ENNEVER AND A [facility]      SALVAGE CHEMO 2013/  AUTOLOGUS STEM CELL TRANSPLANT MAY 2014 AT [facility]  . Dysrhythmia     past hx pvc  . Anxiety   . PVC (premature ventricular contraction)     "benign"    Past Surgical History  Procedure Laterality Date  . Axillary lymph node biopsy Left 02/02/2013    Procedure: NEEDLE LOCALIZED AXILLARY LYMPH NODE BIOPSY;  Surgeon: Haywood Lasso, MD;  Location: [place];  Service: General;  Laterality: Left;  . Cyst removal neck Left 1980  . Removal right inguinal lymph nodes  08-16-2011  . Left inguinal lymph node bx  09-09-2012  . Transthoracic echocardiogram  03-18-2013    MILD LVH/  EF 55-60%  . Scrotal exploration Right 01/04/2014    Procedure: SCROTUM  EXPLORATION   INGUINAL , EXCISION OF CYSTIC MASS OF RIGHT SPERMATIC CORD, WITH FROZEN SECTION;  Surgeon: Franchot Gallo, MD;  Location: [facility];  Service: Urology;  Laterality: Right;  . Pleura biopsy Left 05/31/2014  . Video assisted thoracoscopy (vats)/wedge resection  05/31/2014  . Bone marrow biopsy  08/2012  . Video assisted thoracoscopy Left 05/31/2014    Procedure: LEFT VIDEO ASSISTED THORACOSCOPY, PLEURAL BIOPSY;  Surgeon: Melrose Nakayama, MD;  Location: [place];  Service: Thoracic;  Laterality: Left;       History of present illness and  Hospital Course:     Kindly see H&P for history of present illness and admission details, please review complete Labs, Consult reports and Test reports for all details in brief  HPI  from the history and physical done on the day of admission   [name] is a 54 y.o. male h/o hodgkins disease, on chemotherapy. Patient presents to ED with an episode of slurred speech that  occurred earlier today. Episode was very short, lasting only a couple of mins at most. Patient did fall and hit his head last week while walking in woods. Symptoms completely resolved shortly after onset. No new numbness nor weakness in any of his muscles or extremities with these symptoms. Does have a chronic Bell's Palsy. Patient [facility] to [facility] symptoms   Hospital Course      1. Slurred speech lasting <1 min - likely TIA, workup thus far unremarkable, EEG negative, MRI/MRA brain unremarkable. Nonacute and stable echogram, carotid duplex. On aspirin per neurology. Deficits have resolved. Discussed with neurologist on call Dr. Leonel Ramsay. Stable for discharge on 81 mg of aspirin. Needs outpatient LDL and A1c checked by PCP in a week. Goal LDL will be less than 70. Will follow with neurology post discharge as well.   2. History of Bell's Palsy. Supportive care not acute issues.   3. Hodgkin's disease on chemotherapy under the care of Dr. Marin Olp.  Follow-up with Dr. Marin Olp postdischarge.   4. Chronic CHF systolic. EF 40%. Compensated, will place on low-dose ACE inhibitor& low-dose beta blocker . OutPatient cardiology follow-up, is symptom-free from cardiac standpoint.         Discharge Condition: Stroke   Follow UP  Follow-up Information    Follow up with DAUB, STEVE A, MD. Schedule an appointment as soon as possible for a visit in 1 week.   Specialty:  Family Medicine   Contact information:   [place] [address] [phone]       Follow up with [facility]. Schedule an appointment as soon as possible for a visit in 1 week.   Contact information:   [address] [place] [address] [phone]      Follow up with [facility]. Schedule an appointment as soon as possible for a visit today.   Why:  CHF   Contact information:   [address] [place] [address]         Discharge Instructions  and  Discharge Medications      Discharge Instructions    Diet - low sodium heart healthy    Complete by:  As directed      Discharge instructions    Complete by:  As directed   Follow with Primary MD DAUB, STEVE A, MD in 7 days   Get CBC, CMP, Lipid panel, 2 view Chest X ray checked  by Primary MD next visit.    Activity: As tolerated with Full fall precautions use walker/cane & assistance as needed   Disposition Home     Diet: Heart Healthy    For Heart failure patients - Check your Weight same time everyday, if you gain over 2 pounds, or you develop in leg swelling, experience more shortness of breath or chest pain, call your Primary MD immediately. Follow Cardiac Low Salt Diet and 1.5 lit/day fluid restriction.   On your next visit with your primary care physician please Get Medicines reviewed and adjusted.   Please request your Prim.MD to go over all Hospital Tests and Procedure/Radiological results  at the follow up, please get all Hospital records sent to your Prim MD by signing hospital release before you go home.   If you experience worsening of your admission symptoms, develop shortness of breath, life threatening emergency, suicidal or homicidal thoughts you must seek medical attention immediately by calling 911 or calling your MD immediately  if symptoms less  severe.  You Must read complete instructions/literature along with all the possible adverse reactions/side effects for all the Medicines you take and that have been prescribed to you. Take any new Medicines after you have completely understood and accpet all the possible adverse reactions/side effects.   Do not drive, operating heavy machinery, perform activities at heights, swimming or participation in water activities or provide baby sitting services if your were admitted for syncope or siezures until you have seen by Primary MD or a Neurologist and advised to do so again.  Do not drive when taking Pain medications.    Do not take more than prescribed Pain, Sleep and Anxiety Medications  Special Instructions: If you have smoked or chewed Tobacco  in the last 2 yrs please stop smoking, stop any regular Alcohol  and or any Recreational drug use.  Wear Seat belts while driving.   Please note  You were cared for by a hospitalist during your hospital stay. If you have any questions about your discharge medications or the care you received while you were in the hospital after you are discharged, you can call the unit and asked to speak with the hospitalist on call if the hospitalist that took care of you is not available. Once you are discharged, your primary care physician will handle any further medical issues. Please note that NO REFILLS for any discharge medications will be authorized once you are discharged, as it is imperative that you return to your primary care physician (or establish a relationship with a primary care  physician if you do not have one) for your aftercare needs so that they can reassess your need for medications and monitor your lab values.     Increase activity slowly    Complete by:  As directed             Medication List    TAKE these medications        acetaminophen 325 MG tablet  Commonly known as:  TYLENOL  Take 325 mg by mouth every 6 (six) hours as needed for mild pain.     ALPRAZolam 0.5 MG tablet  Commonly known as:  XANAX  TAKE 1 TABLET BY MOUTH AT BEDTIME AS NEEDED FOR SLEEP     aspirin 81 MG chewable tablet  Chew 1 tablet (81 mg total) by mouth daily.     citalopram 20 MG tablet  Commonly known as:  CELEXA  Take 1 tablet (20 mg total) by mouth daily.     dexamethasone 4 MG tablet  Commonly known as:  DECADRON  Take 2 tablets (8 mg total) by mouth 2 (two) times daily with a meal. Start the day after chemotherapy for 2 days. Take with food.     ibuprofen 200 MG tablet  Commonly known as:  ADVIL,MOTRIN  Take 200-400 mg by mouth every 6 (six) hours as needed for mild pain.     lidocaine-prilocaine cream  Commonly known as:  EMLA  Apply 1 application topically as needed. Apply nickel sized amount to portacath site at least 1 hour prior to chemotherapy.     lisinopril 2.5 MG tablet  Commonly known as:  PRINIVIL,ZESTRIL  Take 1 tablet (2.5 mg total) by mouth daily.     LORazepam 1 MG tablet  Commonly known as:  ATIVAN  TAKE 1/2 A TABLET BY MOUTH TWICE DAILY DURING THE DAY FOR ANXIETY, AND 1 TABLET AT BEDTIME IF NEEDED     metoprolol tartrate 25 MG tablet  Commonly  known as:  LOPRESSOR  Take 0.5 tablets (12.5 mg total) by mouth 2 (two) times daily.     multivitamin tablet  Take 1 tablet by mouth daily.     ondansetron 8 MG tablet  Commonly known as:  ZOFRAN  Take 1 tablet (8 mg total) by mouth 2 (two) times daily. Start the day after chemo for 2 days. Then take as needed for nausea or vomiting.     prochlorperazine 10 MG tablet  Commonly known as:   COMPAZINE  Take 1 tablet (10 mg total) by mouth every 6 (six) hours as needed (Nausea or vomiting).     vitamin B-6 250 MG tablet  Take 1 tablet (250 mg total) by mouth daily.     Vitamin D-3 1000 UNITS Caps  Take by mouth 2 (two) times daily.          Diet and Activity recommendation: See Discharge Instructions above   Consults obtained - Neuro   Major procedures and Radiology Reports - PLEASE review detailed and final reports for all details, in brief -    EEG  EEG Abnormalities: None. Clinical Interpretation: This normal EEG is recorded in the waking state. There was no seizure or seizure predisposition recorded on this study.    TTE  - Left ventricle: Abnormal septal motion Hard to judge EF due tofrequent ventricular ectopy and poor image quality. The cavity size was mildly dilated. Wall thickness was normal. The estimatedejection fraction was 40%. - Atrial septum: No defect or patent foramen ovale was identified. - Pericardium, extracardiac: A trivial pericardial effusion wasidentified.   Carotids  Bilateral carotid artery duplex completed: 1-39% ICA stenosis. Vertebral artery flow is antegrade.     Dg Chest 2 View  03/04/2015   CLINICAL DATA:  Episode of slurred speech occurring earlier today. Patient fell in struck head last week. History of Hodgkin's lymphoma.  EXAM: CHEST  2 VIEW  COMPARISON:  06/12/2014  FINDINGS: Are port type central venous catheter with tip over the low SVC region. Surgical clips in the mediastinum and left hilum. Normal heart size and pulmonary vascularity. No focal airspace disease or consolidation in the lungs. No blunting of costophrenic angles. No pneumothorax. Mediastinal contours appear intact. Mild degenerative changes in the spine.  IMPRESSION: No active cardiopulmonary disease.   Electronically Signed   By: Lucienne Capers M.D.   On: 03/04/2015 22:22   Ct Head Wo Contrast  03/04/2015   CLINICAL DATA:  Acute slurred speech  today. History of Hodgkin's lymphoma.  EXAM: CT HEAD WITHOUT CONTRAST  TECHNIQUE: Contiguous axial images were obtained from the base of the skull through the vertex without intravenous contrast.  COMPARISON:  06/12/2014 brain MR  FINDINGS: No intracranial abnormalities are identified, including mass lesion or mass effect, hydrocephalus, extra-axial fluid collection, midline shift, hemorrhage, or acute infarction.  The visualized bony calvarium is unremarkable.  IMPRESSION: Unremarkable noncontrast head CT.   Electronically Signed   By: Margarette Canada M.D.   On: 03/04/2015 19:37   Mr Brain Wo Contrast  03/05/2015   ADDENDUM REPORT: 03/05/2015 07:06  ADDENDUM: Since the time of initial dictation, the time-of-flight sequence has now become available for review. The intracranial MRA portion of this exam remains normal. No high-grade stenosis or arterial occlusion identified. No intracranial aneurysm.   Electronically Signed   By: Jeannine Boga M.D.   On: 03/05/2015 07:06   03/05/2015   CLINICAL DATA:  Initial valuation for acute aphasia, now resolved. History of Hodgkin's disease, Bell's  palsy, PVCs.  EXAM: MRI HEAD WITHOUT CONTRAST  MRA HEAD WITHOUT CONTRAST  TECHNIQUE: Multiplanar, multiecho pulse sequences of the brain and surrounding structures were obtained without intravenous contrast. Angiographic images of the head were obtained using MRA technique without contrast.  COMPARISON:  Prior CT from earlier the same day.  FINDINGS: MRI HEAD FINDINGS  Cerebral volume within normal limits for patient age. Few scattered T2/FLAIR hyperintense foci noted within the periventricular white matter, nonspecific, but of doubtful clinical significance. No other parenchymal signal abnormality.  No abnormal foci of restricted diffusion to suggest acute intracranial infarct. Gray-white matter differentiation is maintained. Normal intravascular flow voids are preserved. No acute or chronic intracranial hemorrhage.  No mass  lesion or midline shift. No hydrocephalus. No extra-axial fluid collection.  Craniocervical junction within normal limits. Pituitary gland unremarkable. No acute abnormality seen about the orbits.  Polypoid mucosal thickening present within the right maxillary sinus. There is mild mucosal thickening within the left maxillary sinus as well. No mastoid effusion.  Bone marrow signal intensity within normal limits. Scalp soft tissues unremarkable.  MRA HEAD FINDINGS  ANTERIOR CIRCULATION:  Study is limited as the time-of-flight sequence is not available for review at the time of the dictation.  There is widely patent flow within knee internal cerebral arteries bilaterally without occlusion or high-grade stenosis. A1 segments well opacified. Anterior cerebral arteries opacified and symmetric bilaterally.  M1 segments widely patent without proximal branch occlusion or stenosis. MCA bifurcations within normal limits. Distal MCA branches unremarkable.  POSTERIOR CIRCULATION:  Vertebral arteries widely patent to the vertebrobasilar junction. Right posterior inferior cerebral artery patent. Left posterior inferior cerebral artery not visualized. Basilar artery widely patent. Superior cerebral arteries patent proximally. Posterior cerebral arteries widely patent bilaterally.  No definite aneurysm or vascular malformation.  IMPRESSION: MRI HEAD IMPRESSION:  Unremarkable brain MRI with no acute intracranial infarct or other abnormality identified.  MRA HEAD IMPRESSION:  Limited study with no proximal branch occlusion or hemodynamically significant stenosis identified. Please note that this study is limited as the time-of-flight sequences are unavailable for viewing at the time of this dictation. Once this sequence becomes available, an addendum confirming these findings will be made.  Electronically Signed: By: Jeannine Boga M.D. On: 03/04/2015 23:27   Mr Jodene Nam Head/brain Wo Cm  03/05/2015   ADDENDUM REPORT: 03/05/2015  07:06  ADDENDUM: Since the time of initial dictation, the time-of-flight sequence has now become available for review. The intracranial MRA portion of this exam remains normal. No high-grade stenosis or arterial occlusion identified. No intracranial aneurysm.   Electronically Signed   By: Jeannine Boga M.D.   On: 03/05/2015 07:06   03/05/2015   CLINICAL DATA:  Initial valuation for acute aphasia, now resolved. History of Hodgkin's disease, Bell's palsy, PVCs.  EXAM: MRI HEAD WITHOUT CONTRAST  MRA HEAD WITHOUT CONTRAST  TECHNIQUE: Multiplanar, multiecho pulse sequences of the brain and surrounding structures were obtained without intravenous contrast. Angiographic images of the head were obtained using MRA technique without contrast.  COMPARISON:  Prior CT from earlier the same day.  FINDINGS: MRI HEAD FINDINGS  Cerebral volume within normal limits for patient age. Few scattered T2/FLAIR hyperintense foci noted within the periventricular white matter, nonspecific, but of doubtful clinical significance. No other parenchymal signal abnormality.  No abnormal foci of restricted diffusion to suggest acute intracranial infarct. Gray-white matter differentiation is maintained. Normal intravascular flow voids are preserved. No acute or chronic intracranial hemorrhage.  No mass lesion or midline shift. No hydrocephalus. No  extra-axial fluid collection.  Craniocervical junction within normal limits. Pituitary gland unremarkable. No acute abnormality seen about the orbits.  Polypoid mucosal thickening present within the right maxillary sinus. There is mild mucosal thickening within the left maxillary sinus as well. No mastoid effusion.  Bone marrow signal intensity within normal limits. Scalp soft tissues unremarkable.  MRA HEAD FINDINGS  ANTERIOR CIRCULATION:  Study is limited as the time-of-flight sequence is not available for review at the time of the dictation.  There is widely patent flow within knee internal  cerebral arteries bilaterally without occlusion or high-grade stenosis. A1 segments well opacified. Anterior cerebral arteries opacified and symmetric bilaterally.  M1 segments widely patent without proximal branch occlusion or stenosis. MCA bifurcations within normal limits. Distal MCA branches unremarkable.  POSTERIOR CIRCULATION:  Vertebral arteries widely patent to the vertebrobasilar junction. Right posterior inferior cerebral artery patent. Left posterior inferior cerebral artery not visualized. Basilar artery widely patent. Superior cerebral arteries patent proximally. Posterior cerebral arteries widely patent bilaterally.  No definite aneurysm or vascular malformation.  IMPRESSION: MRI HEAD IMPRESSION:  Unremarkable brain MRI with no acute intracranial infarct or other abnormality identified.  MRA HEAD IMPRESSION:  Limited study with no proximal branch occlusion or hemodynamically significant stenosis identified. Please note that this study is limited as the time-of-flight sequences are unavailable for viewing at the time of this dictation. Once this sequence becomes available, an addendum confirming these findings will be made.  Electronically Signed: By: Jeannine Boga M.D. On: 03/04/2015 23:27    Micro Results     No results found for this or any previous visit (from the past 240 hour(s)).     Today   Subjective:   Brian [name] today has no headache,no chest abdominal pain,no new weakness tingling or numbness, feels much better wants to go home today.    Objective:   Blood pressure 126/80, pulse 72, temperature 98.3 F (36.8 C), temperature source Oral, resp. rate 16, height _0  (1.778 m), weight 77.52 kg (170 lb 14.4 oz), SpO2 98 %.   Intake/Output Summary (Last 24 hours) at 03/05/15 1619 Last data filed at 03/05/15 1200  Gross per 24 hour  Intake    840 ml  Output      1 ml  Net    839 ml    Exam Awake Alert, Oriented x 3, No new F.N deficits, Normal  affect [place].AT,PERRAL Supple Neck,No JVD, No cervical lymphadenopathy appriciated.  Symmetrical Chest wall movement, Good air movement bilaterally, CTAB RRR,No Gallops,Rubs or new Murmurs, No Parasternal Heave +ve B.Sounds, Abd Soft, Non tender, No organomegaly appriciated, No rebound -guarding or rigidity. No Cyanosis, Clubbing or edema, No new Rash or bruise  Data Review   CBC w Diff: Lab Results  Component Value Date   WBC 7.6 03/04/2015   WBC 3.4* 02/17/2015   WBC 8.1 03/25/2013   HGB 14.0 03/04/2015   HGB 14.4 02/17/2015   HGB 11.6* 03/25/2013   HCT 40.2 03/04/2015   HCT 41.6 02/17/2015   HCT 34.9* 03/25/2013   PLT 243 03/04/2015   PLT 217 02/17/2015   PLT 155 03/25/2013   LYMPHOPCT 7* 03/04/2015   LYMPHOPCT 18.3 02/17/2015   LYMPHOPCT 7.7* 03/25/2013   MONOPCT 11 03/04/2015   MONOPCT 16.3* 02/17/2015   MONOPCT 13.8 03/25/2013   EOSPCT 0 03/04/2015   EOSPCT 3.8 02/17/2015   EOSPCT 0.5 03/25/2013   BASOPCT 0 03/04/2015   BASOPCT 1.2 02/17/2015   BASOPCT 0.5 03/25/2013    CMP: Lab Results  Component Value Date   NA 138 03/04/2015   NA 137 03/04/2015   NA 142 03/25/2013   K 4.0 03/04/2015   K 4.4 03/04/2015   K 4.1 03/25/2013   CL 102 03/04/2015   CL 104 03/04/2015   CL 107 03/25/2013   CO2 25 03/04/2015   CO2 25 03/04/2015   CO2 27 03/25/2013   BUN 23 03/04/2015   BUN 20 03/04/2015   BUN 12.7 03/25/2013   CREATININE 1.05 03/04/2015   CREATININE 1.1 03/04/2015   CREATININE 0.9 03/25/2013   PROT 7.6 03/04/2015   PROT 6.6 03/04/2015   PROT 6.7 03/25/2013   ALBUMIN 4.7 03/04/2015   ALBUMIN 3.8 03/25/2013   BILITOT 0.8 03/04/2015   BILITOT 0.80 03/04/2015   BILITOT 0.31 03/25/2013   ALKPHOS 65 03/04/2015   ALKPHOS 64 03/04/2015   ALKPHOS 122 03/25/2013   AST 31 03/04/2015   AST 31 03/04/2015   AST 33 03/25/2013   ALT 31 03/04/2015   ALT 29 03/04/2015   ALT 65* 03/25/2013  .  No results found for: HGBA1C  Lab Results  Component Value Date    CHOL 178 02/02/2014   HDL 44 02/02/2014   LDLCALC 115* 02/02/2014   TRIG 97 02/02/2014   CHOLHDL 4.0 02/02/2014    Total Time in preparing paper work, data evaluation and todays exam - 35 minutes  Thurnell Lose M.D on 03/05/2015 at [facility]   Office  [phone]

## 2015-03-05 NOTE — Evaluation (Signed)
Physical Therapy Evaluation Patient Details Name: Brian [name] MRN: [id number] DOB: 1961-09-14 Today's Date: 03/05/2015   History of Present Illness  54 y.o. male h/o hodgkins disease, on chemotherapy. Patient presents to ED with an episode of slurred speech that occurred earlier today. Episode was very short, lasting only a couple of mins at most. Patient did fall and hit his head last week while walking in woods. Symptoms completely resolved shortly after onset. No new numbness nor weakness in any of his muscles or extremities with these symptoms. Does have a chronic Bell's Palsy.  Clinical Impression  Patient evaluated for balance and mobility, no deficits noted despite history of falls, ambulated, performed stairs and DGI assessment independently. NO further acute PT needs a this time. Will sign off.     Follow Up Recommendations No PT follow up    Equipment Recommendations  None recommended by PT    Recommendations for Other Services       Precautions / Restrictions        Mobility  Bed Mobility Overal bed mobility: Independent                Transfers Overall transfer level: Independent                  Ambulation/Gait Ambulation/Gait assistance: Independent Ambulation Distance (Feet): 420 Feet Assistive device: None          Stairs Stairs: Yes Stairs assistance: Independent Stair Management: Alternating pattern;No rails Number of Stairs: 6    Wheelchair Mobility    Modified Rankin (Stroke Patients Only) Modified Rankin (Stroke Patients Only) Pre-Morbid Rankin Score: No symptoms Modified Rankin: No significant disability     Balance Overall balance assessment: History of Falls                               Standardized Balance Assessment Standardized Balance Assessment : Dynamic Gait Index   Dynamic Gait Index Level Surface: Normal Change in Gait Speed: Normal Gait with Horizontal Head Turns: Normal Gait with  Vertical Head Turns: Normal Gait and Pivot Turn: Normal Step Over Obstacle: Mild Impairment Step Around Obstacles: Normal Steps: Normal Total Score: 23       Pertinent Vitals/Pain Pain Assessment: No/denies pain    Home Living Family/patient expects to be discharged to:: Private residence Living Arrangements: Children Available Help at Discharge: Family Type of Home: House Home Access: Stairs to enter Entrance Stairs-Rails: None Entrance Stairs-Number of Steps: 6 Home Layout: Bed/bath upstairs;Two level Home Equipment: None      Prior Function Level of Independence: Independent               Hand Dominance   Dominant Hand: Right    Extremity/Trunk Assessment   Upper Extremity Assessment: Overall WFL for tasks assessed (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)           Lower Extremity Assessment: Overall WFL for tasks assessed (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)         Communication      Cognition Arousal/Alertness: Awake/alert                          General Comments      Exercises        Assessment/Plan    PT Assessment Patent does not need any further PT services  PT Diagnosis Difficulty walking   PT Problem List  PT Treatment Interventions     PT Goals (Current goals can be found in the Care Plan section) Acute Rehab PT Goals PT Goal Formulation: All assessment and education complete, DC therapy    Frequency     Barriers to discharge        Co-evaluation               End of Session   Activity Tolerance: Patient tolerated treatment well Patient left: in bed;with call bell/phone within reach Nurse Communication: Mobility status         Time: 1435-1450 PT Time Calculation (min) (ACUTE ONLY): 15 min   Charges:   PT Evaluation $Initial PT Evaluation Tier I: 1 Procedure     PT G CodesDuncan Dull 03-23-2015, 3:36 PM

## 2015-03-05 NOTE — Progress Notes (Signed)
Routine EEG completed, results pending. 

## 2015-03-05 NOTE — Progress Notes (Signed)
Bilateral carotid artery duplex completed:  1-39% ICA stenosis.  Vertebral artery flow is antegrade.     

## 2015-03-05 NOTE — Progress Notes (Signed)
DC instructions and handouts given to patient. Patient encouraged to write all questions down and ask PCP. Writer answered questions as was able. Patient to be in [facility] while escorted to lobby by staff and to be transported home by friend in private vehicle.

## 2015-03-05 NOTE — Progress Notes (Addendum)
Patient Demographics  Brian [name], is a 54 y.o. male, DOB - August 31, 1961, XOV:[id number]  Admit date - 03/04/2015   Admitting Physician Etta Quill, DO  Outpatient Primary MD for the patient is DAUB, Lina Sayre, MD  LOS - 1   Chief Complaint  Patient presents with  . Aphasia        Subjective:   Brian [name] today has, No headache, No chest pain, No abdominal pain - No Nausea, No new weakness tingling or numbness, No Cough - SOB.    Assessment & Plan    1. Slurred speech lasting <1 min - likely TIA, workup thus far unremarkable, EEG negative, MRI/MRA brain unremarkable. Pending echogram, carotid duplex. On aspirin per neurology. Deficits have resolved. LDL was greater than 70 and has been placed on low-dose statin.  No results found for: HGBA1C  Lab Results  Component Value Date   CHOL 178 02/02/2014   HDL 44 02/02/2014   LDLCALC 115* 02/02/2014   TRIG 97 02/02/2014   CHOLHDL 4.0 02/02/2014    2. History of Bell's biopsy. Supportive care not acute issues.   3. Hodgkin's disease on chemotherapy under the care of Dr. Marin Olp. Follow-up with Dr. Marin Olp postdischarge.    4. Chronic CHF systolic. EF 40%. Compensated, will place on low-dose ACE inhibitor upon discharge. If blood pressure stable low-dose beta blocker will be initiated as well. OutPatient cardiology follow-up.     Code Status: Full  Family Communication: None  Disposition Plan: Home   Procedures   CT head, MRI/MRA brain. Echogram. Carotid duplex, EEG   Consults  Neuro   Medications  Scheduled Meds: . aspirin  81 mg Oral Daily  . citalopram  20 mg Oral Daily  . heparin  5,000 Units Subcutaneous 3 times per day  . vitamin B-6  250 mg Oral Daily   Continuous Infusions:  PRN Meds:.acetaminophen, ALPRAZolam,  ibuprofen, LORazepam  DVT Prophylaxis    Heparin    Lab Results  Component Value Date   PLT 243 03/04/2015    Antibiotics     Anti-infectives    None        Objective:   Filed Vitals:   03/05/15 0806 03/05/15 0900 03/05/15 0956 03/05/15 1205  BP: 114/75 124/78 124/78 130/73  Pulse: 78 85 85 70  Temp: 98.3 F (36.8 C) 98.1 F (36.7 C) 98.1 F (36.7 C) 98.8 F (37.1 C)  TempSrc: Oral Oral  Oral  Resp: 16 16 16 20   Height:      Weight:      SpO2: 100% 97% 97% 98%    Wt Readings from Last 3 Encounters:  03/05/15 77.52 kg (170 lb 14.4 oz)  02/17/15 81.194 kg (179 lb)  01/20/15 80.74 kg (178 lb)     Intake/Output Summary (Last 24 hours) at 03/05/15 1343 Last data filed at 03/04/15 2023  Gross per 24 hour  Intake    240 ml  Output      0 ml  Net    240 ml     Physical Exam  Awake Alert, Oriented X 3, No new F.N deficits, Normal affect [place].AT,PERRAL Supple Neck,No JVD, No cervical lymphadenopathy appriciated.  Symmetrical Chest wall movement, Good air movement bilaterally, CTAB RRR,No Gallops,Rubs or  new Murmurs, No Parasternal Heave +ve B.Sounds, Abd Soft, No tenderness, No organomegaly appriciated, No rebound - guarding or rigidity. No Cyanosis, Clubbing or edema, No new Rash or bruise      Data Review   TTE  - Left ventricle: Abnormal septal motion Hard to judge EF due to frequent ventricular ectopy and poor image quality. The cavity size was mildly dilated. Wall thickness was normal. The estimatedejection fraction was 40%. - Atrial septum: No defect or patent foramen ovale was identified. - Pericardium, extracardiac: A trivial pericardial effusion wasidentified.   Micro Results No results found for this or any previous visit (from the past 240 hour(s)).  Radiology Reports Dg Chest 2 View  03/04/2015   CLINICAL DATA:  Episode of slurred speech occurring earlier today. Patient fell in struck head last week. History of Hodgkin's lymphoma.   EXAM: CHEST  2 VIEW  COMPARISON:  06/12/2014  FINDINGS: Are port type central venous catheter with tip over the low SVC region. Surgical clips in the mediastinum and left hilum. Normal heart size and pulmonary vascularity. No focal airspace disease or consolidation in the lungs. No blunting of costophrenic angles. No pneumothorax. Mediastinal contours appear intact. Mild degenerative changes in the spine.  IMPRESSION: No active cardiopulmonary disease.   Electronically Signed   By: Lucienne Capers M.D.   On: 03/04/2015 22:22   Ct Head Wo Contrast  03/04/2015   CLINICAL DATA:  Acute slurred speech today. History of Hodgkin's lymphoma.  EXAM: CT HEAD WITHOUT CONTRAST  TECHNIQUE: Contiguous axial images were obtained from the base of the skull through the vertex without intravenous contrast.  COMPARISON:  06/12/2014 brain MR  FINDINGS: No intracranial abnormalities are identified, including mass lesion or mass effect, hydrocephalus, extra-axial fluid collection, midline shift, hemorrhage, or acute infarction.  The visualized bony calvarium is unremarkable.  IMPRESSION: Unremarkable noncontrast head CT.   Electronically Signed   By: Margarette Canada M.D.   On: 03/04/2015 19:37   Mr Brain Wo Contrast  03/05/2015   ADDENDUM REPORT: 03/05/2015 07:06  ADDENDUM: Since the time of initial dictation, the time-of-flight sequence has now become available for review. The intracranial MRA portion of this exam remains normal. No high-grade stenosis or arterial occlusion identified. No intracranial aneurysm.   Electronically Signed   By: Jeannine Boga M.D.   On: 03/05/2015 07:06   03/05/2015   CLINICAL DATA:  Initial valuation for acute aphasia, now resolved. History of Hodgkin's disease, Bell's palsy, PVCs.  EXAM: MRI HEAD WITHOUT CONTRAST  MRA HEAD WITHOUT CONTRAST  TECHNIQUE: Multiplanar, multiecho pulse sequences of the brain and surrounding structures were obtained without intravenous contrast. Angiographic images of the  head were obtained using MRA technique without contrast.  COMPARISON:  Prior CT from earlier the same day.  FINDINGS: MRI HEAD FINDINGS  Cerebral volume within normal limits for patient age. Few scattered T2/FLAIR hyperintense foci noted within the periventricular white matter, nonspecific, but of doubtful clinical significance. No other parenchymal signal abnormality.  No abnormal foci of restricted diffusion to suggest acute intracranial infarct. Gray-white matter differentiation is maintained. Normal intravascular flow voids are preserved. No acute or chronic intracranial hemorrhage.  No mass lesion or midline shift. No hydrocephalus. No extra-axial fluid collection.  Craniocervical junction within normal limits. Pituitary gland unremarkable. No acute abnormality seen about the orbits.  Polypoid mucosal thickening present within the right maxillary sinus. There is mild mucosal thickening within the left maxillary sinus as well. No mastoid effusion.  Bone marrow signal intensity within  normal limits. Scalp soft tissues unremarkable.  MRA HEAD FINDINGS  ANTERIOR CIRCULATION:  Study is limited as the time-of-flight sequence is not available for review at the time of the dictation.  There is widely patent flow within knee internal cerebral arteries bilaterally without occlusion or high-grade stenosis. A1 segments well opacified. Anterior cerebral arteries opacified and symmetric bilaterally.  M1 segments widely patent without proximal branch occlusion or stenosis. MCA bifurcations within normal limits. Distal MCA branches unremarkable.  POSTERIOR CIRCULATION:  Vertebral arteries widely patent to the vertebrobasilar junction. Right posterior inferior cerebral artery patent. Left posterior inferior cerebral artery not visualized. Basilar artery widely patent. Superior cerebral arteries patent proximally. Posterior cerebral arteries widely patent bilaterally.  No definite aneurysm or vascular malformation.  IMPRESSION:  MRI HEAD IMPRESSION:  Unremarkable brain MRI with no acute intracranial infarct or other abnormality identified.  MRA HEAD IMPRESSION:  Limited study with no proximal branch occlusion or hemodynamically significant stenosis identified. Please note that this study is limited as the time-of-flight sequences are unavailable for viewing at the time of this dictation. Once this sequence becomes available, an addendum confirming these findings will be made.  Electronically Signed: By: Jeannine Boga M.D. On: 03/04/2015 23:27   Mr Jodene Nam Head/brain Wo Cm  03/05/2015   ADDENDUM REPORT: 03/05/2015 07:06  ADDENDUM: Since the time of initial dictation, the time-of-flight sequence has now become available for review. The intracranial MRA portion of this exam remains normal. No high-grade stenosis or arterial occlusion identified. No intracranial aneurysm.   Electronically Signed   By: Jeannine Boga M.D.   On: 03/05/2015 07:06   03/05/2015   CLINICAL DATA:  Initial valuation for acute aphasia, now resolved. History of Hodgkin's disease, Bell's palsy, PVCs.  EXAM: MRI HEAD WITHOUT CONTRAST  MRA HEAD WITHOUT CONTRAST  TECHNIQUE: Multiplanar, multiecho pulse sequences of the brain and surrounding structures were obtained without intravenous contrast. Angiographic images of the head were obtained using MRA technique without contrast.  COMPARISON:  Prior CT from earlier the same day.  FINDINGS: MRI HEAD FINDINGS  Cerebral volume within normal limits for patient age. Few scattered T2/FLAIR hyperintense foci noted within the periventricular white matter, nonspecific, but of doubtful clinical significance. No other parenchymal signal abnormality.  No abnormal foci of restricted diffusion to suggest acute intracranial infarct. Gray-white matter differentiation is maintained. Normal intravascular flow voids are preserved. No acute or chronic intracranial hemorrhage.  No mass lesion or midline shift. No hydrocephalus. No  extra-axial fluid collection.  Craniocervical junction within normal limits. Pituitary gland unremarkable. No acute abnormality seen about the orbits.  Polypoid mucosal thickening present within the right maxillary sinus. There is mild mucosal thickening within the left maxillary sinus as well. No mastoid effusion.  Bone marrow signal intensity within normal limits. Scalp soft tissues unremarkable.  MRA HEAD FINDINGS  ANTERIOR CIRCULATION:  Study is limited as the time-of-flight sequence is not available for review at the time of the dictation.  There is widely patent flow within knee internal cerebral arteries bilaterally without occlusion or high-grade stenosis. A1 segments well opacified. Anterior cerebral arteries opacified and symmetric bilaterally.  M1 segments widely patent without proximal branch occlusion or stenosis. MCA bifurcations within normal limits. Distal MCA branches unremarkable.  POSTERIOR CIRCULATION:  Vertebral arteries widely patent to the vertebrobasilar junction. Right posterior inferior cerebral artery patent. Left posterior inferior cerebral artery not visualized. Basilar artery widely patent. Superior cerebral arteries patent proximally. Posterior cerebral arteries widely patent bilaterally.  No definite aneurysm or vascular malformation.  IMPRESSION: MRI HEAD IMPRESSION:  Unremarkable brain MRI with no acute intracranial infarct or other abnormality identified.  MRA HEAD IMPRESSION:  Limited study with no proximal branch occlusion or hemodynamically significant stenosis identified. Please note that this study is limited as the time-of-flight sequences are unavailable for viewing at the time of this dictation. Once this sequence becomes available, an addendum confirming these findings will be made.  Electronically Signed: By: Jeannine Boga M.D. On: 03/04/2015 23:27     CBC  Recent Labs Lab 03/04/15 1908  WBC 7.6  HGB 14.0  HCT 40.2  PLT 243  MCV 95.0  MCH 33.1  MCHC  34.8  RDW 13.6  LYMPHSABS 0.5*  MONOABS 0.9  EOSABS 0.0  BASOSABS 0.0    Chemistries   Recent Labs Lab 03/04/15 1443 03/04/15 1908  NA 137 138  K 4.4 4.0  CL 104 102  CO2 25 25  GLUCOSE 187* 103*  BUN 20 23  CREATININE 1.1 1.05  CALCIUM 8.6 9.4  AST 31 31  ALT 29 31  ALKPHOS 64 65  BILITOT 0.80 0.8   ------------------------------------------------------------------------------------------------------------------ estimated creatinine clearance is 83 mL/min (by C-G formula based on Cr of 1.05). ------------------------------------------------------------------------------------------------------------------ No results for input(s): HGBA1C in the last 72 hours. ------------------------------------------------------------------------------------------------------------------ No results for input(s): CHOL, HDL, LDLCALC, TRIG, CHOLHDL, LDLDIRECT in the last 72 hours. ------------------------------------------------------------------------------------------------------------------ No results for input(s): TSH, T4TOTAL, T3FREE, THYROIDAB in the last 72 hours.  Invalid input(s): FREET3 ------------------------------------------------------------------------------------------------------------------ No results for input(s): VITAMINB12, FOLATE, FERRITIN, TIBC, IRON, RETICCTPCT in the last 72 hours.  Coagulation profile  Recent Labs Lab 03/04/15 1908  INR 1.08    No results for input(s): DDIMER in the last 72 hours.  Cardiac Enzymes No results for input(s): CKMB, TROPONINI, MYOGLOBIN in the last 168 hours.  Invalid input(s): CK ------------------------------------------------------------------------------------------------------------------ Invalid input(s): POCBNP   Lab Results  Component Value Date   CHOL 178 02/02/2014   HDL 44 02/02/2014   LDLCALC 115* 02/02/2014   TRIG 97 02/02/2014   CHOLHDL 4.0 02/02/2014    No results found for: HGBA1C   Time Spent  in minutes  35   Bralynn Velador K M.D on 03/05/2015 at 1:43 PM  Between 7am to 7pm - Pager - [phone]  After 7pm go to [email or web address] - password [facility]  [facility]   Office  [phone]

## 2015-03-05 NOTE — Progress Notes (Signed)
Dr. Ronnie Derby has spoken with this patient twice on the phone today.  Once earlier for 20 minutes and this afternoon for 5 minutes.  Patients questions and concerns were addressed both times.  This RN was present when MD spoke with patient on the phone this afternoon.

## 2015-03-05 NOTE — Evaluation (Signed)
Clinical/Bedside Swallow Evaluation Patient Details  Name: Brian [name] MRN: [id number] Date of Birth: 1961/01/26  Today's Date: 03/05/2015 Time: SLP Start Time (ACUTE ONLY): 57 SLP Stop Time (ACUTE ONLY): 1140 SLP Time Calculation (min) (ACUTE ONLY): 10 min  Past Medical History:  Past Medical History  Diagnosis Date  . Wears contact lenses   . H/O autologous stem cell transplant     may 2014  at [facility]  . History of peptic ulcer   . Mass of right inguinal region   . History of Bell's palsy     2009  RIGHT SIDE--  HAS 80% FUNCTION / PT STATES A LITTLE ASYMETRICAL AND EFFECTS MOUTH  . Hodgkin's disease, nodular sclerosis, of inguinal region/lower limb ONOLOGIST--  DR ENNEVER AND A [facility]      SALVAGE CHEMO 2013/  AUTOLOGUS STEM CELL TRANSPLANT MAY 2014 AT [facility]  . Dysrhythmia     past hx pvc  . Anxiety   . PVC (premature ventricular contraction)     "benign"   Past Surgical History:  Past Surgical History  Procedure Laterality Date  . Axillary lymph node biopsy Left 02/02/2013    Procedure: NEEDLE LOCALIZED AXILLARY LYMPH NODE BIOPSY;  Surgeon: Haywood Lasso, MD;  Location: [place];  Service: General;  Laterality: Left;  . Cyst removal neck Left 1980  . Removal right inguinal lymph nodes  08-16-2011  . Left inguinal lymph node bx  09-09-2012  . Transthoracic echocardiogram  03-18-2013    MILD LVH/  EF 55-60%  . Scrotal exploration Right 01/04/2014    Procedure: SCROTUM EXPLORATION   INGUINAL , EXCISION OF CYSTIC MASS OF RIGHT SPERMATIC CORD, WITH FROZEN SECTION;  Surgeon: Franchot Gallo, MD;  Location: [facility];  Service: Urology;  Laterality: Right;  . Pleura biopsy Left 05/31/2014  . Video assisted thoracoscopy (vats)/wedge resection  05/31/2014  . Bone marrow biopsy  08/2012  . Video assisted thoracoscopy Left 05/31/2014    Procedure: LEFT VIDEO ASSISTED THORACOSCOPY, PLEURAL BIOPSY;  Surgeon: Melrose Nakayama, MD;  Location: [place];   Service: Thoracic;  Laterality: Left;   HPI:  HPI: Brian [name] is a 54 y.o. male h/o hodgkins disease, on chemotherapy. Patient presents to ED with an episode of slurred speech that occurred earlier today. Episode was very short, lasting only a couple of mins at most. Patient did fall and hit his head last week while walking in woods. Symptoms completely resolved shortly after onset. No new numbness nor weakness in any of his muscles or extremities with these symptoms. Does have a chronic Bell's Palsy.   Assessment / Plan / Recommendation Clinical Impression  Patient's swallow function is within normal limits as per this evaluation. He did not exhibit any overt s/s aspiration or difficulty with any of the tested P.O.s    Aspiration Risk  None    Diet Recommendation Thin liquid;Regular   Liquid Administration via: Cup;Straw Medication Administration: Whole meds with liquid Supervision: Patient able to self feed    Other  Recommendations     Follow Up Recommendations       Frequency and Duration        Pertinent Vitals/Pain     SLP Swallow Goals     Swallow Study Prior Functional Status  Type of Home: House Available Help at Discharge: Family    General Date of Onset: 03/04/15 HPI: HPI: Brian [name] is a 54 y.o. male h/o hodgkins disease, on chemotherapy. Patient presents to ED  with an episode of slurred speech that occurred earlier today. Episode was very short, lasting only a couple of mins at most. Patient did fall and hit his head last week while walking in woods. Symptoms completely resolved shortly after onset. No new numbness nor weakness in any of his muscles or extremities with these symptoms. Does have a chronic Bell's Palsy. Type of Study: Bedside swallow evaluation Previous Swallow Assessment: N/A Diet Prior to this Study: Regular;Thin liquids Temperature Spikes Noted: No Respiratory Status: Room air History of Recent Intubation:  No Behavior/Cognition: Alert;Cooperative;Pleasant mood Oral Cavity - Dentition: Adequate natural dentition Self-Feeding Abilities: Able to feed self Patient Positioning: Upright in bed Baseline Vocal Quality: Clear Volitional Cough: Strong Volitional Swallow: Able to elicit    Oral/Motor/Sensory Function Overall Oral Motor/Sensory Function: Appears within functional limits for tasks assessed   Ice Chips Ice chips: Not tested   Thin Liquid Thin Liquid: Within functional limits Presentation: Cup;Self Fed;Straw    Nectar Thick Nectar Thick Liquid: Not tested   Honey Thick Honey Thick Liquid: Not tested   Puree Puree: Not tested   Solid   GO    Solid: Within functional limits Presentation: Self Brian [name], Brian [name] Brian [name] 03/05/2015,5:40 PM   Dannial Monarch, MA, CCC-SLP

## 2015-03-05 NOTE — Progress Notes (Signed)
  Echocardiogram 2D Echocardiogram has been performed.  Brian [name] 03/05/2015, 9:15 AM

## 2015-03-05 NOTE — Progress Notes (Signed)
Pt arrived to unit alert and oriented x4. Oriented to unit and room. Will continue to monitor. Bobbye Charleston, RN

## 2015-03-05 NOTE — Discharge Instructions (Signed)
Follow with Primary MD DAUB, STEVE A, MD in 7 days   Get CBC, CMP, A1c, Lipid Panel, 2 view Chest X ray checked  by Primary MD next visit.    Activity: As tolerated with Full fall precautions use walker/cane & assistance as needed   Disposition Home     Diet: Heart Healthy    For Heart failure patients - Check your Weight same time everyday, if you gain over 2 pounds, or you develop in leg swelling, experience more shortness of breath or chest pain, call your Primary MD immediately. Follow Cardiac Low Salt Diet and 1.5 lit/day fluid restriction.   On your next visit with your primary care physician please Get Medicines reviewed and adjusted.   Please request your Prim.MD to go over all Hospital Tests and Procedure/Radiological results at the follow up, please get all Hospital records sent to your Prim MD by signing hospital release before you go home.   If you experience worsening of your admission symptoms, develop shortness of breath, life threatening emergency, suicidal or homicidal thoughts you must seek medical attention immediately by calling 911 or calling your MD immediately  if symptoms less severe.  You Must read complete instructions/literature along with all the possible adverse reactions/side effects for all the Medicines you take and that have been prescribed to you. Take any new Medicines after you have completely understood and accpet all the possible adverse reactions/side effects.   Do not drive, operating heavy machinery, perform activities at heights, swimming or participation in water activities or provide baby sitting services if your were admitted for syncope or siezures until you have seen by Primary MD or a Neurologist and advised to do so again.  Do not drive when taking Pain medications.    Do not take more than prescribed Pain, Sleep and Anxiety Medications  Special Instructions: If you have smoked or chewed Tobacco  in the last 2 yrs please stop smoking,  stop any regular Alcohol  and or any Recreational drug use.  Wear Seat belts while driving.   Please note  You were cared for by a hospitalist during your hospital stay. If you have any questions about your discharge medications or the care you received while you were in the hospital after you are discharged, you can call the unit and asked to speak with the hospitalist on call if the hospitalist that took care of you is not available. Once you are discharged, your primary care physician will handle any further medical issues. Please note that NO REFILLS for any discharge medications will be authorized once you are discharged, as it is imperative that you return to your primary care physician (or establish a relationship with a primary care physician if you do not have one) for your aftercare needs so that they can reassess your need for medications and monitor your lab values.

## 2015-03-05 NOTE — Procedures (Signed)
History: 54 yo M with episode of slurred speech  Sedation: None  Technique: This is a 17 channel routine scalp EEG performed at the bedside with bipolar and monopolar montages arranged in accordance to the international 10/20 system of electrode placement. One channel was dedicated to EKG recording.   Background: The background consists of intermixed alpha and beta activities. There is a well defined posterior dominant rhythm of 8 Hz that attenuates with eye opening.   Photic stimulation: Physiologic driving is present  EEG Abnormalities: None  Clinical Interpretation: This normal EEG is recorded in the waking  state. There was no seizure or seizure predisposition recorded on this study.   Roland Rack, MD [facility] [phone]  If 7pm- 7am, please page neurology on call as listed in [place].

## 2015-03-08 ENCOUNTER — Encounter: Payer: Self-pay | Admitting: Cardiovascular Disease

## 2015-03-08 ENCOUNTER — Encounter: Payer: Self-pay | Admitting: Neurology

## 2015-03-08 ENCOUNTER — Ambulatory Visit (INDEPENDENT_AMBULATORY_CARE_PROVIDER_SITE_OTHER): Payer: Commercial Managed Care - PPO | Admitting: Neurology

## 2015-03-08 ENCOUNTER — Ambulatory Visit (INDEPENDENT_AMBULATORY_CARE_PROVIDER_SITE_OTHER): Payer: Commercial Managed Care - PPO | Admitting: Cardiovascular Disease

## 2015-03-08 VITALS — BP 110/80 | HR 85 | Ht 70.0 in | Wt 176.8 lb

## 2015-03-08 VITALS — BP 124/85 | HR 54 | Ht 70.0 in | Wt 176.0 lb

## 2015-03-08 DIAGNOSIS — G451 Carotid artery syndrome (hemispheric): Secondary | ICD-10-CM

## 2015-03-08 DIAGNOSIS — I427 Cardiomyopathy due to drug and external agent: Secondary | ICD-10-CM

## 2015-03-08 DIAGNOSIS — T451X5A Adverse effect of antineoplastic and immunosuppressive drugs, initial encounter: Secondary | ICD-10-CM | POA: Diagnosis not present

## 2015-03-08 NOTE — Progress Notes (Signed)
PATIENT: Brian [name] DOB: 14-May-1961  HISTORICAL  Brian [name] is a 54 years old right-handed male, referred by his primary care physician Ivar Bury, follow-up his most recent hospital visit March 04 2015  He had a history of right Bell's palsy, was residual mild right facial weakness, was diagnosed with Hodgkin's lymphoma in August 2013, complete first round of chemotherapy, radiation therapy, was found to have recurrent mass in July 2015, is receiving Adcetris every 4 weeks, since July 2015, noticed bilateral fingertips and toes paresthesia since January 2016, completed 12 out of 16 planned cycles  In March 04 2015, while talking with his friend, he noticed sudden onset word finding difficulties, lasting for 1 minute, no right side weakness  He presented to emergency room, MRI of the brain, MRA of the brain was normal, he was discharged with aspirin 81 mg daily  Echocardiogram showed ejection fraction 40%, diffuse hypokinesis, planning on to have MRI of heart, and stress testing,  He exercise regularly, reported excessive stress,   REVIEW OF SYSTEMS: Full 14 system review of systems performed and notable only for ringing ears   ALLERGIES: No Known Allergies  HOME MEDICATIONS: Current Outpatient Prescriptions  Medication Sig Dispense Refill  . acetaminophen (TYLENOL) 325 MG tablet Take 325 mg by mouth every 6 (six) hours as needed for mild pain.    Marland Kitchen ALPRAZolam (XANAX) 0.5 MG tablet TAKE 1 TABLET BY MOUTH AT BEDTIME AS NEEDED FOR SLEEP 30 tablet 2  . aspirin 81 MG chewable tablet Chew 1 tablet (81 mg total) by mouth daily. 30 tablet 0  . Cholecalciferol (VITAMIN D-3) 1000 UNITS CAPS Take by mouth 2 (two) times daily.    . citalopram (CELEXA) 20 MG tablet Take 1 tablet (20 mg total) by mouth daily. 30 tablet 6  . dexamethasone (DECADRON) 4 MG tablet Take 2 tablets (8 mg total) by mouth 2 (two) times daily with a meal. Start the day after chemotherapy for 2 days. Take with food.  30 tablet 1  . ibuprofen (ADVIL,MOTRIN) 200 MG tablet Take 200-400 mg by mouth every 6 (six) hours as needed for mild pain.     Marland Kitchen lidocaine-prilocaine (EMLA) cream Apply 1 application topically as needed. Apply nickel sized amount to portacath site at least 1 hour prior to chemotherapy. 30 g 1  . lisinopril (PRINIVIL,ZESTRIL) 2.5 MG tablet Take 1 tablet (2.5 mg total) by mouth daily. 30 tablet 0  . LORazepam (ATIVAN) 1 MG tablet TAKE 1/2 A TABLET BY MOUTH TWICE DAILY DURING THE DAY FOR ANXIETY, AND 1 TABLET AT BEDTIME IF NEEDED 20 tablet 3  . metoprolol tartrate (LOPRESSOR) 25 MG tablet Take 0.5 tablets (12.5 mg total) by mouth 2 (two) times daily. 60 tablet 0  . Multiple Vitamin (MULTIVITAMIN) tablet Take 1 tablet by mouth daily.    . ondansetron (ZOFRAN) 8 MG tablet Take 1 tablet (8 mg total) by mouth 2 (two) times daily. Start the day after chemo for 2 days. Then take as needed for nausea or vomiting. 30 tablet 1  . prochlorperazine (COMPAZINE) 10 MG tablet Take 1 tablet (10 mg total) by mouth every 6 (six) hours as needed (Nausea or vomiting). 30 tablet 1  . Pyridoxine HCl (VITAMIN B-6) 250 MG tablet Take 1 tablet (250 mg total) by mouth daily. 30 tablet 6   No current facility-administered medications for this visit.   Facility-Administered Medications Ordered in Other Visits  Medication Dose Route Frequency Provider Last Rate Last Dose  .  sodium chloride 0.9 % injection 10 mL  10 mL Intracatheter PRN [name], MD   10 mL at 06/17/14 1355    PAST MEDICAL HISTORY: Past Medical History  Diagnosis Date  . Wears contact lenses   . H/O autologous stem cell transplant     may 2014  at [facility]  . History of peptic ulcer   . Mass of right inguinal region   . History of Bell's palsy     2009  RIGHT SIDE--  HAS 80% FUNCTION / PT STATES A LITTLE ASYMETRICAL AND EFFECTS MOUTH  . Hodgkin's disease, nodular sclerosis, of inguinal region/lower limb ONOLOGIST--  DR [facility] AND A [facility]       SALVAGE CHEMO 2013/  AUTOLOGUS STEM CELL TRANSPLANT MAY 2014 AT [facility]  . Dysrhythmia     past hx pvc  . Anxiety   . PVC (premature ventricular contraction)     "benign"    PAST SURGICAL HISTORY: Past Surgical History  Procedure Laterality Date  . Axillary lymph node biopsy Left 02/02/2013    Procedure: NEEDLE LOCALIZED AXILLARY LYMPH NODE BIOPSY;  Surgeon: Haywood Lasso, MD;  Location: [place];  Service: General;  Laterality: Left;  . Cyst removal neck Left 1980  . Removal right inguinal lymph nodes  08-16-2011  . Left inguinal lymph node bx  09-09-2012  . Transthoracic echocardiogram  03-18-2013    MILD LVH/  EF 55-60%  . Scrotal exploration Right 01/04/2014    Procedure: SCROTUM EXPLORATION   INGUINAL , EXCISION OF CYSTIC MASS OF RIGHT SPERMATIC CORD, WITH FROZEN SECTION;  Surgeon: Franchot Gallo, MD;  Location: [facility];  Service: Urology;  Laterality: Right;  . Pleura biopsy Left 05/31/2014  . Video assisted thoracoscopy (vats)/wedge resection  05/31/2014  . Bone marrow biopsy  08/2012  . Video assisted thoracoscopy Left 05/31/2014    Procedure: LEFT VIDEO ASSISTED THORACOSCOPY, PLEURAL BIOPSY;  Surgeon: Melrose Nakayama, MD;  Location: [place];  Service: Thoracic;  Laterality: Left;    FAMILY HISTORY: Family History  Problem Relation Age of Onset  . Cancer Mother 58    ovarian  . Heart disease Father   . Stroke Father   . Cancer Sister     ovarian  . Cancer Brother     testicular    SOCIAL HISTORY:  History   Social History  . Marital Status: Married    Spouse Name: N/A  . Number of Children: 2  . Years of Education: N/A   Occupational History  . Arboriculturist Communication    Social History Main Topics  . Smoking status: Never Smoker   . Smokeless tobacco: Never Used     Comment: never smoked tobacco  . Alcohol Use: 2.4 oz/week    4 Cans of beer per week  . Drug Use: No  . Sexual Activity: Not on file   Other Topics  Concern  . Not on file   Social History Narrative   Married. Education: [employer]. Exercise: jog/walk/ lift weights 7 days a week, 1-2 miles.   PHYSICAL EXAM   Filed Vitals:   03/08/15 1327  BP: 124/85  Pulse: 54  Height: 5' 10"  (1.778 m)  Weight: 176 lb (79.833 kg)    Not recorded      Body mass index is 25.25 kg/(m^2).  PHYSICAL EXAMNIATION:  Gen: NAD, conversant, well nourised, obese, well groomed  Cardiovascular: Regular rate rhythm, no peripheral edema, warm, nontender. Eyes: Conjunctivae clear without exudates or hemorrhage Neck: Supple, no carotid bruise. Pulmonary: Clear to auscultation bilaterally   NEUROLOGICAL EXAM:  MENTAL STATUS: Speech:    Speech is normal; fluent and spontaneous with normal comprehension.  Cognition:    The patient is oriented to person, place, and time;     recent and remote memory intact;     language fluent;     normal attention, concentration,     fund of knowledge.  CRANIAL NERVES: CN II: Visual fields are full to confrontation. Fundoscopic exam is normal with sharp discs and no vascular changes. Venous pulsations are present bilaterally. Pupils are 4 mm and briskly reactive to light. Visual acuity is 20/20 bilaterally. CN III, IV, VI: extraocular movement are normal. No ptosis. CN V: Facial sensation is intact to pinprick in all 3 divisions bilaterally. Corneal responses are intact.  CN VII: He has mild right eye-closure, right cheek puff weakness, synergistic movement of right face, CN VIII: Hearing is normal to rubbing fingers CN IX, X: Palate elevates symmetrically. Phonation is normal. CN XI: Head turning and shoulder shrug are intact CN XII: Tongue is midline with normal movements and no atrophy.  MOTOR: There is no pronator drift of out-stretched arms. Muscle bulk and tone are normal. Muscle strength is normal.   Shoulder abduction Shoulder external rotation Elbow flexion Elbow extension Wrist flexion  Wrist extension Finger abduction Hip flexion Knee flexion Knee extension Ankle dorsi flexion Ankle plantar flexion  R 5 5 5 5 5 5 5 5 5 5 5 5   L 5 5 5 5 5 5 5 5 5 5 5 5     REFLEXES: Reflexes are 2+ and symmetric at the biceps, triceps, knees, and ankles. Plantar responses are flexor.  SENSORY: Light touch, pinprick, position sense, and vibration sense are intact in fingers and toes.  COORDINATION: Rapid alternating movements and fine finger movements are intact. There is no dysmetria on finger-to-nose and heel-knee-shin. There are no abnormal or extraneous movements.   GAIT/STANCE: Posture is normal. Gait is steady with normal steps, base, arm swing, and turning. Heel and toe walking are normal. Tandem gait is normal.  Romberg is absent.   DIAGNOSTIC DATA (LABS, IMAGING, TESTING) - I reviewed patient records, labs, notes, testing and imaging myself where available.  Lab Results  Component Value Date   WBC 7.6 03/04/2015   HGB 14.0 03/04/2015   HCT 40.2 03/04/2015   MCV 95.0 03/04/2015   PLT 243 03/04/2015      Component Value Date/Time   NA 138 03/04/2015 1908   NA 137 03/04/2015 1443   NA 142 03/25/2013 0828   K 4.0 03/04/2015 1908   K 4.4 03/04/2015 1443   K 4.1 03/25/2013 0828   CL 102 03/04/2015 1908   CL 104 03/04/2015 1443   CL 107 03/25/2013 0828   CO2 25 03/04/2015 1908   CO2 25 03/04/2015 1443   CO2 27 03/25/2013 0828   GLUCOSE 103* 03/04/2015 1908   GLUCOSE 187* 03/04/2015 1443   GLUCOSE 89 03/25/2013 0828   BUN 23 03/04/2015 1908   BUN 20 03/04/2015 1443   BUN 12.7 03/25/2013 0828   CREATININE 1.05 03/04/2015 1908   CREATININE 1.1 03/04/2015 1443   CREATININE 0.9 03/25/2013 0828   CALCIUM 9.4 03/04/2015 1908   CALCIUM 8.6 03/04/2015 1443   CALCIUM 9.2 03/25/2013 0828   PROT 7.6 03/04/2015 1908   PROT 6.6 03/04/2015 1443  PROT 6.7 03/25/2013 0828   ALBUMIN 4.7 03/04/2015 1908   ALBUMIN 3.8 03/25/2013 0828   AST 31 03/04/2015 1908   AST 31  03/04/2015 1443   AST 33 03/25/2013 0828   ALT 31 03/04/2015 1908   ALT 29 03/04/2015 1443   ALT 65* 03/25/2013 0828   ALKPHOS 65 03/04/2015 1908   ALKPHOS 64 03/04/2015 1443   ALKPHOS 122 03/25/2013 0828   BILITOT 0.8 03/04/2015 1908   BILITOT 0.80 03/04/2015 1443   BILITOT 0.31 03/25/2013 0828   GFRNONAA 79* 03/04/2015 1908   GFRAA >90 03/04/2015 1908   Lab Results  Component Value Date   CHOL 178 02/02/2014   HDL 44 02/02/2014   LDLCALC 115* 02/02/2014   TRIG 97 02/02/2014   CHOLHDL 4.0 02/02/2014   No results found for: HGBA1C No results found for: VITAMINB12 Lab Results  Component Value Date   TSH 2.513 08/24/2013      ASSESSMENT AND PLAN  Brian [name] is a 54 y.o. male  with history of Hodgkin's lymphoma, recurrent, going through chemotherapy, mild peripheral neuropathy, presenting with TIA episode, transient word finding difficulties, MRI, MRA of the brain was normal,   1, on daily aspirin 81 mg daily 2, echocardiogram showed low ejection fraction 40%, hypokinesis, cardiac workup is planned 3, continue moderate exercise, return to clinic for new issues    Marcial Pacas, M.D. Ph.D.  [facility] [address] [place], [place] [address] Ph: [phone] Fax: [phone]

## 2015-03-08 NOTE — Progress Notes (Signed)
UR complete.  Harshini Trent RN, MSN 

## 2015-03-08 NOTE — Patient Instructions (Signed)
Your physician recommends that you schedule a follow-up appointment in: [place]  Your physician recommends that you continue on your current medications as directed. Please refer to the Current Medication list given to you today.  Your physician has requested that you have an exercise tolerance test. For further information please visit [email or web address]. Please also follow instruction sheet, as given.   Your physician has requested that you have a cardiac MRI. Cardiac MRI uses a computer to create images of your heart as its beating, producing both still and moving pictures of your heart and major blood vessels. For further information please visit [email or web address] Please follow the instruction sheet given to you today for more information.

## 2015-03-08 NOTE — Progress Notes (Signed)
Cardiology Office Note   Date:  03/08/2015   ID:  Brian [name], DOB 1961/05/09, MRN [id number]  PCP:  Jenny Reichmann, MD  Cardiologist:   Jenkins Rouge, MD   No chief complaint on file.     History of Present Illness: Brian [name] is a 54 y.o. male who presents for cardiomyopathy.  Just d/c from hospital for ? TIA.  Had slurred speech for a minute.  W/U negative MRI normal Telemetry normal no PAF.  Echo reviewed and EF 40% diffuse hypokinesis.  Compared to ecoh done 05/2014 EF 60% with normal GLS -22.  He has recurrent Hodgkin Lymphoma.   Post stem cell transplant 2 years ago at [place] had multiple rounds chemo and XRT over last 3 years.  Currently has had 12 monthly rounds of Adcetris and is due for 4 more with Dr Marin Olp. No clinical CHF.  Has not started beta blocker or ACE prescribed by hospitalist.  Also has had Bells Palsy on right which clouds matter of slurred speech.  Under some stress.  Separated from wife and has two boys.  Works at [employer] with my nephew's wife No dyspnea  Use to run a lot but more sedentary now     Past Medical History  Diagnosis Date  . Wears contact lenses   . H/O autologous stem cell transplant     may 2014  at [facility]  . History of peptic ulcer   . Mass of right inguinal region   . History of Bell's palsy     2009  RIGHT SIDE--  HAS 80% FUNCTION / PT STATES A LITTLE ASYMETRICAL AND EFFECTS MOUTH  . Hodgkin's disease, nodular sclerosis, of inguinal region/lower limb ONOLOGIST--  DR [facility] AND A [facility]      SALVAGE CHEMO 2013/  AUTOLOGUS STEM CELL TRANSPLANT MAY 2014 AT [facility]  . Dysrhythmia     past hx pvc  . Anxiety   . PVC (premature ventricular contraction)     "benign"    Past Surgical History  Procedure Laterality Date  . Axillary lymph node biopsy Left 02/02/2013    Procedure: NEEDLE LOCALIZED AXILLARY LYMPH NODE BIOPSY;  Surgeon: Haywood Lasso, MD;  Location: [place];  Service: General;  Laterality: Left;  . Cyst removal  neck Left 1980  . Removal right inguinal lymph nodes  08-16-2011  . Left inguinal lymph node bx  09-09-2012  . Transthoracic echocardiogram  03-18-2013    MILD LVH/  EF 55-60%  . Scrotal exploration Right 01/04/2014    Procedure: SCROTUM EXPLORATION   INGUINAL , EXCISION OF CYSTIC MASS OF RIGHT SPERMATIC CORD, WITH FROZEN SECTION;  Surgeon: Franchot Gallo, MD;  Location: [facility];  Service: Urology;  Laterality: Right;  . Pleura biopsy Left 05/31/2014  . Video assisted thoracoscopy (vats)/wedge resection  05/31/2014  . Bone marrow biopsy  08/2012  . Video assisted thoracoscopy Left 05/31/2014    Procedure: LEFT VIDEO ASSISTED THORACOSCOPY, PLEURAL BIOPSY;  Surgeon: Melrose Nakayama, MD;  Location: [place];  Service: Thoracic;  Laterality: Left;     Current Outpatient Prescriptions  Medication Sig Dispense Refill  . acetaminophen (TYLENOL) 325 MG tablet Take 325 mg by mouth every 6 (six) hours as needed for mild pain.    Marland Kitchen ALPRAZolam (XANAX) 0.5 MG tablet TAKE 1 TABLET BY MOUTH AT BEDTIME AS NEEDED FOR SLEEP 30 tablet 2  . aspirin 81 MG chewable tablet Chew 1 tablet (81 mg total) by  mouth daily. 30 tablet 0  . Cholecalciferol (VITAMIN D-3) 1000 UNITS CAPS Take by mouth 2 (two) times daily.    . citalopram (CELEXA) 20 MG tablet Take 1 tablet (20 mg total) by mouth daily. 30 tablet 6  . dexamethasone (DECADRON) 4 MG tablet Take 2 tablets (8 mg total) by mouth 2 (two) times daily with a meal. Start the day after chemotherapy for 2 days. Take with food. 30 tablet 1  . ibuprofen (ADVIL,MOTRIN) 200 MG tablet Take 200-400 mg by mouth every 6 (six) hours as needed for mild pain.     Marland Kitchen lidocaine-prilocaine (EMLA) cream Apply 1 application topically as needed. Apply nickel sized amount to portacath site at least 1 hour prior to chemotherapy. 30 g 1  . lisinopril (PRINIVIL,ZESTRIL) 2.5 MG tablet Take 1 tablet (2.5 mg total) by mouth daily. 30 tablet 0  . LORazepam (ATIVAN) 1 MG tablet  TAKE 1/2 A TABLET BY MOUTH TWICE DAILY DURING THE DAY FOR ANXIETY, AND 1 TABLET AT BEDTIME IF NEEDED 20 tablet 3  . metoprolol tartrate (LOPRESSOR) 25 MG tablet Take 0.5 tablets (12.5 mg total) by mouth 2 (two) times daily. 60 tablet 0  . Multiple Vitamin (MULTIVITAMIN) tablet Take 1 tablet by mouth daily.    . ondansetron (ZOFRAN) 8 MG tablet Take 1 tablet (8 mg total) by mouth 2 (two) times daily. Start the day after chemo for 2 days. Then take as needed for nausea or vomiting. 30 tablet 1  . prochlorperazine (COMPAZINE) 10 MG tablet Take 1 tablet (10 mg total) by mouth every 6 (six) hours as needed (Nausea or vomiting). 30 tablet 1  . Pyridoxine HCl (VITAMIN B-6) 250 MG tablet Take 1 tablet (250 mg total) by mouth daily. 30 tablet 6   No current facility-administered medications for this visit.   Facility-Administered Medications Ordered in Other Visits  Medication Dose Route Frequency Provider Last Rate Last Dose  . sodium chloride 0.9 % injection 10 mL  10 mL Intracatheter PRN [name], MD   10 mL at 06/17/14 1355    Allergies:   Review of patient's allergies indicates no known allergies.    Social History:  The patient  reports that he has never smoked. He has never used smokeless tobacco. He reports that he drinks about 2.4 oz of alcohol per week. He reports that he does not use illicit drugs.   Family History:  The patient's family history includes Cancer in his brother and sister; Cancer (age of onset: 104) in his mother; Heart disease in his father; Stroke in his father.    ROS:  Please see the history of present illness.   Otherwise, review of systems are positive for none.   All other systems are reviewed and negative.    PHYSICAL EXAM: VS:  BP 110/80 mmHg  Pulse 85  Ht 5' 10"  (1.778 m)  Wt 176 lb 12.8 oz (80.196 kg)  BMI 25.37 kg/m2 , BMI Body mass index is 25.37 kg/(m^2). GEN: Well nourished, well developed, in no acute distress HEENT: normal Neck: no JVD, carotid  bruits, or masses Cardiac:  RRR; no murmurs, rubs, or gallops,no edema  Respiratory:  clear to auscultation bilaterally, normal work of breathing GI: soft, nontender, nondistended, + BS MS: no deformity or atrophy Skin: warm and dry, no rash Neuro:  Mild residual right sided facial weakness from Bell's palsy Psych: euthymic mood, full affect   EKG:   NSR PVC;s    Recent Labs: 03/04/2015: ALT 31;  BUN 23; Creatinine 1.05; Hemoglobin 14.0; Platelets 243; Potassium 4.0; Sodium 138    Lipid Panel    Component Value Date/Time   CHOL 178 02/02/2014 1017   TRIG 97 02/02/2014 1017   HDL 44 02/02/2014 1017   CHOLHDL 4.0 02/02/2014 1017   VLDL 19 02/02/2014 1017   LDLCALC 115* 02/02/2014 1017      Wt Readings from Last 3 Encounters:  03/08/15 176 lb 12.8 oz (80.196 kg)  03/05/15 170 lb 14.4 oz (77.52 kg)  02/17/15 179 lb (81.194 kg)      Other studies Reviewed: Additional studies/ records that were reviewed today include: hospital D/C summary Echo MRI and Labs.    ASSESSMENT AND PLAN:  1.  DCM:  Encouraged him to start ACE and beta blocker.  F/U MRI to further assess etiology as idiopathic, related to previous chemo and to quantitate EF And measure strain.  ETT to r/o CAD/ ischemia.  Will discuss with Dr Rise Paganini but to my review of their website and literature it is not commonly Associated with cardiac issues  2. Recurrent Lymphoma: f/u [facility].  Needs 4 more Rx's Adcetris    3. TIA  Etiology unresolved f/u neuro no evidence of cardiac SOE or PAF  Residual right sided facial weakness from Bell's palsy  4. Anxiety / Depression:  Related to recurrent cancer and separation from wife  Continue celexa and xanax    Current medicines are reviewed at length with the patient today.  The patient does not have concerns regarding medicines.  The following changes have been made:  no change  Labs/ tests ordered today include:  ETT and cardiac MRI  Orders Placed This  Encounter  Procedures  . MR Card Morphology Wo/W Cm  . Exercise Tolerance Test     Disposition:   FU with  Me in 3 months        Signed, Jenkins Rouge, MD  03/08/2015 10:40 AM    [facility] [address], [place], [place]  [address] Phone: [phone]; Fax: [phone]

## 2015-03-09 ENCOUNTER — Telehealth: Payer: Self-pay | Admitting: Cardiovascular Disease

## 2015-03-09 NOTE — Telephone Encounter (Signed)
New message     Pt was seen yesterday.  He has been prescribed some new medication.  He has questions about taking these medications with other medicines

## 2015-03-09 NOTE — Telephone Encounter (Signed)
Calling wanting to clarify that the two medications that Dr. Johnsie Cancel prescribed yesterday that there is not any interactions with his other meds. Dr. Johnsie Cancel gave him Rx for Lisinopril and Metoprolol. Advised there should not be any interactions and would be fine to take all of his other medications with these two medications. States Dr. Marin Olp has been out of town so Dr. Johnsie Cancel probably has not been able to get in touch with him regarding his chemo drug Adcetris. Will call back if has any further questions.

## 2015-03-11 ENCOUNTER — Encounter: Payer: Self-pay | Admitting: Hematology & Oncology

## 2015-03-11 ENCOUNTER — Other Ambulatory Visit: Payer: Self-pay | Admitting: Hematology & Oncology

## 2015-03-11 ENCOUNTER — Ambulatory Visit (HOSPITAL_COMMUNITY)
Admission: RE | Admit: 2015-03-11 | Discharge: 2015-03-11 | Disposition: A | Discharge status: Home / Self Care | Payer: Commercial Managed Care - PPO | Source: Ambulatory Visit | Attending: Cardiovascular Disease | Admitting: Cardiovascular Disease

## 2015-03-11 ENCOUNTER — Other Ambulatory Visit: Payer: Self-pay | Admitting: *Deleted

## 2015-03-11 DIAGNOSIS — I427 Cardiomyopathy due to drug and external agent: Secondary | ICD-10-CM

## 2015-03-11 DIAGNOSIS — R0789 Other chest pain: Secondary | ICD-10-CM | POA: Diagnosis present

## 2015-03-11 DIAGNOSIS — T451X5A Adverse effect of antineoplastic and immunosuppressive drugs, initial encounter: Secondary | ICD-10-CM

## 2015-03-11 DIAGNOSIS — C8195 Hodgkin lymphoma, unspecified, lymph nodes of inguinal region and lower limb: Secondary | ICD-10-CM

## 2015-03-11 DIAGNOSIS — G459 Transient cerebral ischemic attack, unspecified: Secondary | ICD-10-CM

## 2015-03-11 MED ORDER — GADOBENATE DIMEGLUMINE 529 MG/ML IV SOLN
25.0000 mL | Freq: Once | INTRAVENOUS | Status: AC
  Administered 2015-03-11: 23 mL via INTRAVENOUS

## 2015-03-14 ENCOUNTER — Encounter: Payer: Self-pay | Admitting: Emergency Medicine

## 2015-03-14 ENCOUNTER — Telehealth: Payer: Self-pay | Admitting: Cardiovascular Disease

## 2015-03-14 NOTE — Telephone Encounter (Signed)
AWAITING STRESS RESULTS .Brian [name]

## 2015-03-14 NOTE — Telephone Encounter (Signed)
Pt calling to get results of MRI and Stress test--pls call

## 2015-03-15 NOTE — Telephone Encounter (Signed)
Follow up      Pt want to talk to Dr Johnsie Cancel only on Wednesday to get answers to the test he had---MRI and stress test

## 2015-03-16 ENCOUNTER — Other Ambulatory Visit: Payer: Self-pay | Admitting: *Deleted

## 2015-03-16 ENCOUNTER — Encounter: Payer: Self-pay | Admitting: Cardiovascular Disease

## 2015-03-16 DIAGNOSIS — I420 Dilated cardiomyopathy: Secondary | ICD-10-CM

## 2015-03-16 DIAGNOSIS — C8195 Hodgkin lymphoma, unspecified, lymph nodes of inguinal region and lower limb: Secondary | ICD-10-CM

## 2015-03-16 NOTE — Telephone Encounter (Signed)
DR Johnsie Cancel SPOKE WITH [name]

## 2015-03-16 NOTE — Telephone Encounter (Signed)
PVC;s decreased with exercise which is good and no evidence of blocked arteries         ----- Message -----     From: Richmond Campbell, LPN     Sent: 6/38/4536  1:35 PM      To: Josue Hector, MD    PT  AWARE OF STRESS RESULTS .Adonis Housekeeper

## 2015-03-17 ENCOUNTER — Other Ambulatory Visit (HOSPITAL_BASED_OUTPATIENT_CLINIC_OR_DEPARTMENT_OTHER): Payer: Commercial Managed Care - PPO

## 2015-03-17 ENCOUNTER — Ambulatory Visit (HOSPITAL_BASED_OUTPATIENT_CLINIC_OR_DEPARTMENT_OTHER): Payer: Commercial Managed Care - PPO | Admitting: Hematology & Oncology

## 2015-03-17 ENCOUNTER — Ambulatory Visit: Payer: Commercial Managed Care - PPO

## 2015-03-17 VITALS — BP 127/81 | HR 62 | Temp 97.8°F | Wt 176.0 lb

## 2015-03-17 DIAGNOSIS — C8195 Hodgkin lymphoma, unspecified, lymph nodes of inguinal region and lower limb: Secondary | ICD-10-CM

## 2015-03-17 DIAGNOSIS — G459 Transient cerebral ischemic attack, unspecified: Secondary | ICD-10-CM

## 2015-03-17 LAB — CBC WITH DIFFERENTIAL (CANCER CENTER ONLY)
BASO#: 0 10*3/uL (ref 0.0–0.2)
BASO%: 0.9 % (ref 0.0–2.0)
EOS ABS: 0.1 10*3/uL (ref 0.0–0.5)
EOS%: 2.5 % (ref 0.0–7.0)
HEMATOCRIT: 41.8 % (ref 38.7–49.9)
HEMOGLOBIN: 14.4 g/dL (ref 13.0–17.1)
LYMPH#: 0.8 10*3/uL — ABNORMAL LOW (ref 0.9–3.3)
LYMPH%: 23.5 % (ref 14.0–48.0)
MCH: 32.8 pg (ref 28.0–33.4)
MCHC: 34.4 g/dL (ref 32.0–35.9)
MCV: 95 fL (ref 82–98)
MONO#: 0.6 10*3/uL (ref 0.1–0.9)
MONO%: 19.7 % — AB (ref 0.0–13.0)
NEUT#: 1.7 10*3/uL (ref 1.5–6.5)
NEUT%: 53.4 % (ref 40.0–80.0)
PLATELETS: 187 10*3/uL (ref 145–400)
RBC: 4.39 10*6/uL (ref 4.20–5.70)
RDW: 13.4 % (ref 11.1–15.7)
WBC: 3.2 10*3/uL — ABNORMAL LOW (ref 4.0–10.0)

## 2015-03-17 NOTE — Progress Notes (Signed)
Hematology and Oncology Follow Up Visit  ALBERT HERSCH [id number] Jul 02, 1961 54 y.o. 03/17/2015   Principle Diagnosis:  Recurrent Hodgkin's Disease -  S/p autologous transplant  TIA-resolved  Current Therapy:    S/p Adcetris x 12 cycles     Interim History:  [name] is in for followup. Shockingly enough, he recently had a TIA. This happened at home. He just could not talk for about a minute.  He went to the hospital. He was admitted overnight. All the studies were unremarkable. He had a decent cardiac evaluation. His ejection fraction was 58%.  MRI of the brain was negative.  He is on testosterone supplementation. This I think we will have to stop.  He currently is on aspirin.  He feels well right now. He has had no neurological complications or neurological deficiencies.  We're going to stop the Adcetris now.  He's had no cough. He's had no shortness of breath. He's had no nausea or vomiting. 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 He is on vitamin B-6 for this.  Overall, his performance status is ECOG 0. Medications:  Current outpatient prescriptions:  .  acetaminophen (TYLENOL) 325 MG tablet, Take 325 mg by mouth every 6 (six) hours as needed for mild pain., Disp: , Rfl:  .  ALPRAZolam (XANAX) 0.5 MG tablet, TAKE 1 TABLET BY MOUTH AT BEDTIME AS NEEDED FOR SLEEP, Disp: 30 tablet, Rfl: 2 .  aspirin 81 MG chewable tablet, Chew 1 tablet (81 mg total) by mouth daily., Disp: 30 tablet, Rfl: 0 .  Cholecalciferol (VITAMIN D-3) 1000 UNITS CAPS, Take by mouth 2 (two) times daily., Disp: , Rfl:  .  citalopram (CELEXA) 20 MG tablet, Take 1 tablet (20 mg total) by mouth daily., Disp: 30 tablet, Rfl: 6 .  dexamethasone (DECADRON) 4 MG tablet, Take 2 tablets (8 mg total) by mouth 2 (two) times daily with a meal. Start the day after chemotherapy for 2 days. Take with food., Disp: 30 tablet, Rfl: 1 .  ibuprofen (ADVIL,MOTRIN) 200 MG tablet, Take 200-400 mg by mouth every 6  (six) hours as needed for mild pain. , Disp: , Rfl:  .  lidocaine-prilocaine (EMLA) cream, Apply 1 application topically as needed. Apply nickel sized amount to portacath site at least 1 hour prior to chemotherapy., Disp: 30 g, Rfl: 1 .  lisinopril (PRINIVIL,ZESTRIL) 2.5 MG tablet, Take 1 tablet (2.5 mg total) by mouth daily., Disp: 30 tablet, Rfl: 0 .  LORazepam (ATIVAN) 1 MG tablet, TAKE 1/2 A TABLET BY MOUTH TWICE DAILY DURING THE DAY FOR ANXIETY, AND 1 TABLET AT BEDTIME IF NEEDED, Disp: 20 tablet, Rfl: 3 .  metoprolol tartrate (LOPRESSOR) 25 MG tablet, Take 0.5 tablets (12.5 mg total) by mouth 2 (two) times daily., Disp: 60 tablet, Rfl: 0 .  Multiple Vitamin (MULTIVITAMIN) tablet, Take 1 tablet by mouth daily., Disp: , Rfl:  .  Pyridoxine HCl (VITAMIN B-6) 250 MG tablet, Take 1 tablet (250 mg total) by mouth daily., Disp: 30 tablet, Rfl: 6  Allergies: No Known Allergies  Past Medical History, Surgical history, Social history, and Family History were reviewed and updated.  Review of Systems: As above  Physical Exam:  weight is 176 lb (79.833 kg). His oral temperature is 97.8 F (36.6 C). His blood pressure is 127/81 and his pulse is 62.   Well-developed and well-nourished white gentleman. Head and neck exam shows no ocular or oral lesions. There are no palpable cervical or supraclavicular lymph nodes.  Lungs are clear. Cardiac exam regular rate and rhythm with no murmurs, rubs or bruits.. Abdomen is soft. He has good bowel sounds. There is no fluid wave. There is no palpable liver or spleen tip. Back exam shows a thoracoscopy site in the left lateral chest wall. This is healed. There is no swelling or erythema. Extremities shows no clubbing, cyanosis or edema. Skin exam shows no rashes, ecchymoses or petechia . Neurological exam is nonfocal.  Lab Results  Component Value Date   WBC 3.2* 03/17/2015   HGB 14.4 03/17/2015   HCT 41.8 03/17/2015   MCV 95 03/17/2015   PLT 187 03/17/2015      Chemistry      Component Value Date/Time   NA 138 03/04/2015 1908   NA 137 03/04/2015 1443   NA 142 03/25/2013 0828   K 4.0 03/04/2015 1908   K 4.4 03/04/2015 1443   K 4.1 03/25/2013 0828   CL 102 03/04/2015 1908   CL 104 03/04/2015 1443   CL 107 03/25/2013 0828   CO2 25 03/04/2015 1908   CO2 25 03/04/2015 1443   CO2 27 03/25/2013 0828   BUN 23 03/04/2015 1908   BUN 20 03/04/2015 1443   BUN 12.7 03/25/2013 0828   CREATININE 1.05 03/04/2015 1908   CREATININE 1.1 03/04/2015 1443   CREATININE 0.9 03/25/2013 0828      Component Value Date/Time   CALCIUM 9.4 03/04/2015 1908   CALCIUM 8.6 03/04/2015 1443   CALCIUM 9.2 03/25/2013 0828   ALKPHOS 65 03/04/2015 1908   ALKPHOS 64 03/04/2015 1443   ALKPHOS 122 03/25/2013 0828   AST 31 03/04/2015 1908   AST 31 03/04/2015 1443   AST 33 03/25/2013 0828   ALT 31 03/04/2015 1908   ALT 29 03/04/2015 1443   ALT 65* 03/25/2013 0828   BILITOT 0.8 03/04/2015 1908   BILITOT 0.80 03/04/2015 1443   BILITOT 0.31 03/25/2013 0828         Impression and Plan: Mr. Marbach is a 54 year old gentleman.  He has history of recurrent Hodgkin's disease. 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 He then had a recurrence after this.  We were thinking of trying to do an allogeneic bone marrow transplant However, he does not want to go through this. He realizes that there are a lot of side effects with an allogeneic transplant.  I am not sure if the TIA was related to the Adcetris. I know that Adonis Housekeeper is known to cause cases of PML. I don't see any evidence of that but yet, I just don't want to see any potential development of further neurological problems.  We are going to stop the Adcetris. He has had 12 of 16 cycles. I think this is appropriate.  We will stop the testosterone. Again this might be a factor.  We will see what his hypercoagulable studies show.  I will set up a PET scan in 1 month. We'll see him back  afterwards. We will flush his Port-A-Cath at that time.  I spent 45 minutes with him today. This was a situation that was very complicated.  [name], MD 4/21/201610:06 AM

## 2015-03-18 ENCOUNTER — Telehealth: Payer: Self-pay | Admitting: Hematology & Oncology

## 2015-03-18 ENCOUNTER — Telehealth: Payer: Self-pay | Admitting: *Deleted

## 2015-03-18 NOTE — Telephone Encounter (Signed)
Faxed medical records for an ACS Cancer Prevention Study 3 to:  [employer]  F: [phone] or [phone] P: [phone] Email: [email or web address]   ACS Observation No: [id number]   COPY SCANNED

## 2015-03-18 NOTE — Telephone Encounter (Addendum)
Patient aware of results.  ----- Message from [name], MD sent at 03/18/2015  7:27 AM EDT ----- Call - cholesterol is not too bad!!  Keep exercising!!  pete

## 2015-03-21 LAB — HYPERCOAGULABLE PANEL, COMPREHENSIVE
ANTICARDIOLIPIN IGM: 0 [MPL'U]/mL (ref <11)
ANTITHROMB III FUNC: 108 % (ref 76–126)
Anticardiolipin IgA: 0 APL U/mL (ref <22)
Anticardiolipin IgG: 6 GPL U/mL (ref <23)
BETA 2 GLYCO I IGG: 7 G Units (ref <20)
BETA-2-GLYCOPROTEIN I IGA: 8 A Units (ref <20)
Beta-2-Glycoprotein I IgM: 6 M Units (ref <20)
DRVVT 1 1 MIX: 39.5 s (ref <42.9)
DRVVT: 48.6 secs — ABNORMAL HIGH (ref <42.9)
LUPUS ANTICOAGULANT: NOT DETECTED
PROTEIN C ACTIVITY: 160 % — AB (ref 75–133)
PROTEIN S ACTIVITY: 131 % — AB (ref 69–129)
PROTEIN S TOTAL: 115 % (ref 60–150)
PTT Lupus Anticoagulant: 29.4 secs (ref 28.0–43.0)
Protein C, Total: 116 % (ref 72–160)

## 2015-03-21 LAB — LIPID PANEL
CHOL/HDL RATIO: 4.3 ratio
CHOLESTEROL: 175 mg/dL (ref 0–200)
HDL: 41 mg/dL (ref >=40)
LDL Cholesterol: 117 mg/dL — ABNORMAL HIGH (ref 0–99)
Triglycerides: 87 mg/dL (ref <150)
VLDL: 17 mg/dL (ref 0–40)

## 2015-03-22 ENCOUNTER — Telehealth: Payer: Self-pay | Admitting: *Deleted

## 2015-03-22 NOTE — Telephone Encounter (Addendum)
Pt aware of results.  ----- Message from [name], MD sent at 03/21/2015  6:32 PM EDT ----- Please call and let him know that there is no obvious blood coagulation abnormality that could've caused the TIA. Thanks

## 2015-04-12 ENCOUNTER — Encounter (HOSPITAL_COMMUNITY)
Admission: RE | Admit: 2015-04-12 | Discharge: 2015-04-12 | Disposition: A | Discharge status: Home / Self Care | Payer: Commercial Managed Care - PPO | Source: Ambulatory Visit | Attending: Hematology & Oncology | Admitting: Hematology & Oncology

## 2015-04-12 ENCOUNTER — Encounter (HOSPITAL_COMMUNITY): Payer: Self-pay

## 2015-04-12 DIAGNOSIS — C8195 Hodgkin lymphoma, unspecified, lymph nodes of inguinal region and lower limb: Secondary | ICD-10-CM | POA: Insufficient documentation

## 2015-04-12 LAB — GLUCOSE, CAPILLARY: GLUCOSE-CAPILLARY: 101 mg/dL — AB (ref 65–99)

## 2015-04-12 MED ORDER — FLUDEOXYGLUCOSE F - 18 (FDG) INJECTION
8.7500 | Freq: Once | INTRAVENOUS | Status: AC | PRN
  Administered 2015-04-12: 8.75 via INTRAVENOUS

## 2015-04-14 ENCOUNTER — Other Ambulatory Visit: Payer: Self-pay

## 2015-04-14 MED ORDER — LISINOPRIL 2.5 MG PO TABS: 2.5000 mg | ORAL_TABLET | Freq: Every day | ORAL | Status: DC

## 2015-04-14 MED ORDER — METOPROLOL TARTRATE 25 MG PO TABS: 12.5000 mg | ORAL_TABLET | Freq: Two times a day (BID) | ORAL | Status: DC

## 2015-04-15 ENCOUNTER — Ambulatory Visit (HOSPITAL_BASED_OUTPATIENT_CLINIC_OR_DEPARTMENT_OTHER): Payer: Commercial Managed Care - PPO | Admitting: Hematology & Oncology

## 2015-04-15 ENCOUNTER — Encounter: Payer: Self-pay | Admitting: Hematology & Oncology

## 2015-04-15 ENCOUNTER — Other Ambulatory Visit (HOSPITAL_BASED_OUTPATIENT_CLINIC_OR_DEPARTMENT_OTHER): Payer: Commercial Managed Care - PPO

## 2015-04-15 ENCOUNTER — Ambulatory Visit (HOSPITAL_BASED_OUTPATIENT_CLINIC_OR_DEPARTMENT_OTHER): Payer: Commercial Managed Care - PPO

## 2015-04-15 VITALS — BP 117/76 | HR 69 | Temp 97.8°F | Resp 18 | Ht 70.0 in | Wt 176.0 lb

## 2015-04-15 DIAGNOSIS — C8195 Hodgkin lymphoma, unspecified, lymph nodes of inguinal region and lower limb: Secondary | ICD-10-CM

## 2015-04-15 DIAGNOSIS — Z9484 Stem cells transplant status: Secondary | ICD-10-CM | POA: Diagnosis not present

## 2015-04-15 LAB — CBC WITH DIFFERENTIAL (CANCER CENTER ONLY)
BASO#: 0 10*3/uL (ref 0.0–0.2)
BASO%: 0.6 % (ref 0.0–2.0)
EOS%: 1.9 % (ref 0.0–7.0)
Eosinophils Absolute: 0.1 10*3/uL (ref 0.0–0.5)
HCT: 39.7 % (ref 38.7–49.9)
HGB: 14.1 g/dL (ref 13.0–17.1)
LYMPH#: 0.8 10*3/uL — ABNORMAL LOW (ref 0.9–3.3)
LYMPH%: 17.9 % (ref 14.0–48.0)
MCH: 33.7 pg — ABNORMAL HIGH (ref 28.0–33.4)
MCHC: 35.5 g/dL (ref 32.0–35.9)
MCV: 95 fL (ref 82–98)
MONO#: 0.6 10*3/uL (ref 0.1–0.9)
MONO%: 12.5 % (ref 0.0–13.0)
NEUT#: 3.1 10*3/uL (ref 1.5–6.5)
NEUT%: 67.1 % (ref 40.0–80.0)
Platelets: 213 10*3/uL (ref 145–400)
RBC: 4.18 10*6/uL — ABNORMAL LOW (ref 4.20–5.70)
RDW: 13.1 % (ref 11.1–15.7)
WBC: 4.6 10*3/uL (ref 4.0–10.0)

## 2015-04-15 LAB — CMP (CANCER CENTER ONLY)
ALT(SGPT): 27 U/L (ref 10–47)
AST: 29 U/L (ref 11–38)
Albumin: 4.6 g/dL (ref 3.3–5.5)
Alkaline Phosphatase: 67 U/L (ref 26–84)
BILIRUBIN TOTAL: 1.1 mg/dL (ref 0.20–1.60)
BUN, Bld: 20 mg/dL (ref 7–22)
CALCIUM: 10.1 mg/dL (ref 8.0–10.3)
CO2: 29 mEq/L (ref 18–33)
Chloride: 103 mEq/L (ref 98–108)
Creat: 1.1 mg/dl (ref 0.6–1.2)
Glucose, Bld: 95 mg/dL (ref 73–118)
Potassium: 4.2 mEq/L (ref 3.3–4.7)
Sodium: 140 mEq/L (ref 128–145)
Total Protein: 7.5 g/dL (ref 6.4–8.1)

## 2015-04-15 LAB — LACTATE DEHYDROGENASE: LDH: 167 U/L (ref 94–250)

## 2015-04-15 MED ORDER — SODIUM CHLORIDE 0.9 % IJ SOLN
10.0000 mL | INTRAMUSCULAR | Status: DC | PRN
  Administered 2015-04-15: 10 mL via INTRAVENOUS
  Filled 2015-04-15: qty 10

## 2015-04-15 MED ORDER — HEPARIN SOD (PORK) LOCK FLUSH 100 UNIT/ML IV SOLN
500.0000 [IU] | Freq: Once | INTRAVENOUS | Status: AC
  Administered 2015-04-15: 500 [IU] via INTRAVENOUS
  Filled 2015-04-15: qty 5

## 2015-04-15 NOTE — Patient Instructions (Signed)
Implanted Port Insertion  An implanted port is a central line that has a round shape and is placed under the skin. It is used as a long-term IV access for:    Medicines, such as chemotherapy.    Fluids.    Liquid nutrition, such as total parenteral nutrition (TPN).    Blood samples.   LET YOUR HEALTH CARE PROVIDER KNOW ABOUT:   Allergies to food or medicine.    Medicines taken, including vitamins, herbs, eye drops, creams, and over-the-counter medicines.    Any allergies to heparin.   Use of steroids (by mouth or creams).    Previous problems with anesthetics or numbing medicines.    History of bleeding problems or blood clots.    Previous surgery.    Other health problems, including diabetes and kidney problems.    Possibility of pregnancy, if this applies.  RISKS AND COMPLICATIONS  Generally, this is a safe procedure. However, as with any procedure, problems can occur. Possible problems include:   Damage to the blood vessel, bruising, or bleeding at the puncture site.    Infection.   Blood clot in the vessel that the port is in.   Breakdown of the skin over your port.   Very rarely a person may develop a condition called a pneumothorax, a collection of air in the chest that may cause one of the lungs to collapse. The placement of these catheters with the appropriate imaging guidance significantly decreases the risk of a pneumothorax.   BEFORE THE PROCEDURE    Your health care provider may want you to have blood tests. These tests can help tell how well your kidneys and liver are working. They can also show how well your blood clots.    If you take blood thinners (anticoagulant medicines), ask your health care provider when you should stop taking them.    Make arrangements for someone to drive you home. This is necessary if you have been sedated for your procedure.   PROCEDURE   Port insertion usually takes about 30-45 minutes.    An IV needle will be inserted in your arm.  Medicine for pain and medicine to help relax you (sedative) will flow directly into your body through this needle.    You will lie on an exam table, and you will be connected to monitors to keep track of your heart rate, blood pressure, and breathing throughout the procedure.   An oxygen monitoring device may be attached to your finger. Oxygen will be given.    Everything will be kept as germ free (sterile) as possible during the procedure. The skin near the point of the incision will be cleansed with antiseptic, and the area will be draped with sterile towels. The skin and deeper tissues over the port area will be made numb with a local anesthetic.   Two small cuts (incisions) will be made in the skin to insert the port. One will be made in the neck to obtain access to the vein where the catheter will lie.    Because the port reservoir will be placed under the skin, a small skin incision will be made in the upper chest, and a small pocket for the port will be made under the skin. The catheter that will be connected to the port tunnels to a large central vein in the chest. A small, raised area will remain on your body at the site of the reservoir when the procedure is complete.   The port placement   will be done under imaging guidance to ensure the proper placement.   The reservoir has a silicone covering that can be punctured with a special needle.    The port will be flushed with normal saline, and blood will be drawn to make sure it is working properly.   There will be nothing remaining outside the skin when the procedure is finished.    Incisions will be held together by stitches, surgical glue, or a special tape.  AFTER THE PROCEDURE   You will stay in a recovery area until the anesthesia has worn off. Your blood pressure and pulse will be checked.   A final chest X-ray will be taken to check the placement of the port and to ensure that there is no injury to your lung.  Document Released:  09/02/2013 Document Revised: 03/29/2014 Document Reviewed: 09/02/2013  [employer] Patient Information 2015 [employer]. This information is not intended to replace advice given to you by your health care provider. Make sure you discuss any questions you have with your health care provider.

## 2015-04-15 NOTE — Progress Notes (Signed)
Hematology and Oncology Follow Up Visit  Brian [name] [id number] October 31, 1961 54 y.o. 04/15/2015   Principle Diagnosis:  Recurrent Hodgkin's Disease -  S/p autologous transplant  TIA-resolved  Current Therapy:    S/p Adcetris x 12 cycles     Interim History:  [name] is in for followup. He really looks good. We went ahead and did a PET scan on him. The PET scan did not show any evidence of recurrent Hodgkin's disease.  He still is worried about recurrence. We got through 12 out of 16 cycles of the Adcetris. He then had a TIA which may been related to the Adcetris. He also was getting supplemental testosterone which could've been a factor.  He is on baby aspirin right now and doing well.  He is going to go with his family opted [place] for Memorial Day weekend to watch baseball.  He's had no nausea vomiting. He still has some tingling in the hands and feet. This is improving.  He still has neuropathy around the left upper abdomen where he had surgery.  Overall, his performance status is ECOG 0. Medications:  Current outpatient prescriptions:  .  acetaminophen (TYLENOL) 325 MG tablet, Take 325 mg by mouth every 6 (six) hours as needed for mild pain., Disp: , Rfl:  .  ALPRAZolam (XANAX) 0.5 MG tablet, TAKE 1 TABLET BY MOUTH AT BEDTIME AS NEEDED FOR SLEEP, Disp: 30 tablet, Rfl: 2 .  aspirin 81 MG chewable tablet, Chew 1 tablet (81 mg total) by mouth daily., Disp: 30 tablet, Rfl: 0 .  Cholecalciferol (VITAMIN D-3) 1000 UNITS CAPS, Take by mouth 2 (two) times daily., Disp: , Rfl:  .  citalopram (CELEXA) 20 MG tablet, Take 1 tablet (20 mg total) by mouth daily., Disp: 30 tablet, Rfl: 6 .  dexamethasone (DECADRON) 4 MG tablet, Take 2 tablets (8 mg total) by mouth 2 (two) times daily with a meal. Start the day after chemotherapy for 2 days. Take with food., Disp: 30 tablet, Rfl: 1 .  ibuprofen (ADVIL,MOTRIN) 200 MG tablet, Take 200-400 mg by mouth every 6 (six) hours as needed for mild  pain. , Disp: , Rfl:  .  lisinopril (PRINIVIL,ZESTRIL) 2.5 MG tablet, Take 1 tablet (2.5 mg total) by mouth daily., Disp: 30 tablet, Rfl: 6 .  LORazepam (ATIVAN) 1 MG tablet, TAKE 1/2 A TABLET BY MOUTH TWICE DAILY DURING THE DAY FOR ANXIETY, AND 1 TABLET AT BEDTIME IF NEEDED, Disp: 20 tablet, Rfl: 3 .  metoprolol tartrate (LOPRESSOR) 25 MG tablet, Take 0.5 tablets (12.5 mg total) by mouth 2 (two) times daily., Disp: 60 tablet, Rfl: 6 .  Multiple Vitamin (MULTIVITAMIN) tablet, Take 1 tablet by mouth daily., Disp: , Rfl:  .  Pyridoxine HCl (VITAMIN B-6) 250 MG tablet, Take 1 tablet (250 mg total) by mouth daily., Disp: 30 tablet, Rfl: 6 .  lidocaine-prilocaine (EMLA) cream, Apply 1 application topically as needed. Apply nickel sized amount to portacath site at least 1 hour prior to chemotherapy. (Patient not taking: Reported on 04/15/2015), Disp: 30 g, Rfl: 1 No current facility-administered medications for this visit.  Facility-Administered Medications Ordered in Other Visits:  .  heparin lock flush 100 unit/mL, 500 Units, Intravenous, Once, [name], MD .  sodium chloride 0.9 % injection 10 mL, 10 mL, Intravenous, PRN, [name], MD  Allergies: No Known Allergies  Past Medical History, Surgical history, Social history, and Family History were reviewed and updated.  Review of Systems: As above  Physical  Exam:  height is 5\' 10"  (1.778 m) and weight is 176 lb (79.833 kg). His oral temperature is 97.8 F (36.6 C). His blood pressure is 117/76 and his pulse is 69. His respiration is 18.   Well-developed and well-nourished white gentleman. Head and neck exam shows no ocular or oral lesions. There are no palpable cervical or supraclavicular lymph nodes. Lungs are clear. Cardiac exam regular rate and rhythm with no murmurs, rubs or bruits.. Abdomen is soft. He has good bowel sounds. There is no fluid wave. There is no palpable liver or spleen tip. Back exam shows a thoracoscopy site in  the left lateral chest wall. This is healed. There is no swelling or erythema. Extremities shows no clubbing, cyanosis or edema. Skin exam shows no rashes, ecchymoses or petechia . Neurological exam is nonfocal.  Lab Results  Component Value Date   WBC 4.6 04/15/2015   HGB 14.1 04/15/2015   HCT 39.7 04/15/2015   MCV 95 04/15/2015   PLT 213 04/15/2015     Chemistry      Component Value Date/Time   NA 138 03/04/2015 1908   NA 137 03/04/2015 1443   NA 142 03/25/2013 0828   K 4.0 03/04/2015 1908   K 4.4 03/04/2015 1443   K 4.1 03/25/2013 0828   CL 102 03/04/2015 1908   CL 104 03/04/2015 1443   CL 107 03/25/2013 0828   CO2 25 03/04/2015 1908   CO2 25 03/04/2015 1443   CO2 27 03/25/2013 0828   BUN 23 03/04/2015 1908   BUN 20 03/04/2015 1443   BUN 12.7 03/25/2013 0828   CREATININE 1.05 03/04/2015 1908   CREATININE 1.1 03/04/2015 1443   CREATININE 0.9 03/25/2013 0828      Component Value Date/Time   CALCIUM 9.4 03/04/2015 1908   CALCIUM 8.6 03/04/2015 1443   CALCIUM 9.2 03/25/2013 0828   ALKPHOS 65 03/04/2015 1908   ALKPHOS 64 03/04/2015 1443   ALKPHOS 122 03/25/2013 0828   AST 31 03/04/2015 1908   AST 31 03/04/2015 1443   AST 33 03/25/2013 0828   ALT 31 03/04/2015 1908   ALT 29 03/04/2015 1443   ALT 65* 03/25/2013 0828   BILITOT 0.8 03/04/2015 1908   BILITOT 0.80 03/04/2015 1443   BILITOT 0.31 03/25/2013 0828         Impression and Plan: Brian [name] is a 54 year old gentleman.  He has history of recurrent Hodgkin's disease. 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 He then had a recurrence after this.  We were thinking of trying to do an allogeneic bone marrow transplant However, he does not want to go through this. He realizes that there are a lot of side effects with an allogeneic transplant.  For now, I think we just follow him along. I really think a PET scan will be necessary in 3 months. I think the PET scan is negative for any  recurrence, then we can hold off on further scans.  He has a Port-A-Cath in. I will like to keep this in for another 8-9 months. I think if we get him through this year without any evidence of recurrence, then we can have the Port-A-Cath removed.  The FDA just approved immunotherapy for Hodgkin's disease. We talked about this. This certainly would be an option I think if he has recurrence since an allogeneic transplant is something that he would like to avoid it.  I spent about 35 minutes with him. We went over  the PET scan and labs and talk about our plan for follow-up and surveillance.  Cassell Smiles, MD 5/20/20161:06 PM

## 2015-04-18 ENCOUNTER — Encounter: Payer: Self-pay | Admitting: Hematology & Oncology

## 2015-04-26 ENCOUNTER — Other Ambulatory Visit: Payer: Self-pay | Admitting: *Deleted

## 2015-04-26 DIAGNOSIS — C8195 Hodgkin lymphoma, unspecified, lymph nodes of inguinal region and lower limb: Secondary | ICD-10-CM

## 2015-04-26 MED ORDER — CITALOPRAM HYDROBROMIDE 20 MG PO TABS: 20.0000 mg | ORAL_TABLET | Freq: Every day | ORAL | Status: DC

## 2015-05-27 ENCOUNTER — Ambulatory Visit (HOSPITAL_BASED_OUTPATIENT_CLINIC_OR_DEPARTMENT_OTHER): Payer: Commercial Managed Care - PPO

## 2015-05-27 DIAGNOSIS — C8195 Hodgkin lymphoma, unspecified, lymph nodes of inguinal region and lower limb: Secondary | ICD-10-CM | POA: Diagnosis not present

## 2015-05-27 DIAGNOSIS — Z452 Encounter for adjustment and management of vascular access device: Secondary | ICD-10-CM

## 2015-05-27 DIAGNOSIS — C8590 Non-Hodgkin lymphoma, unspecified, unspecified site: Secondary | ICD-10-CM

## 2015-05-27 MED ORDER — HEPARIN SOD (PORK) LOCK FLUSH 100 UNIT/ML IV SOLN
500.0000 [IU] | Freq: Once | INTRAVENOUS | Status: AC
  Administered 2015-05-27: 500 [IU] via INTRAVENOUS
  Filled 2015-05-27: qty 5

## 2015-05-27 MED ORDER — SODIUM CHLORIDE 0.9 % IJ SOLN
10.0000 mL | INTRAMUSCULAR | Status: DC | PRN
  Administered 2015-05-27: 10 mL via INTRAVENOUS
  Filled 2015-05-27: qty 10

## 2015-06-03 ENCOUNTER — Ambulatory Visit: Payer: Commercial Managed Care - PPO | Admitting: Cardiovascular Disease

## 2015-07-07 ENCOUNTER — Telehealth: Payer: Self-pay | Admitting: Hematology & Oncology

## 2015-07-07 NOTE — Telephone Encounter (Signed)
APPROVED: [phone]   [facility]  To: Ledell Noss Fx: [phone]   Ph: [phone] ext [phone]   PATIENT: Brian [name]   DOB: 1961/08/11 DOS: 07/11/2015 ID: [id number] DX: C81.95 PET: [id number]

## 2015-07-11 ENCOUNTER — Ambulatory Visit (HOSPITAL_COMMUNITY)
Admission: RE | Admit: 2015-07-11 | Discharge: 2015-07-11 | Disposition: A | Discharge status: Home / Self Care | Payer: Commercial Managed Care - PPO | Source: Ambulatory Visit | Attending: Hematology & Oncology | Admitting: Hematology & Oncology

## 2015-07-11 DIAGNOSIS — C8190 Hodgkin lymphoma, unspecified, unspecified site: Secondary | ICD-10-CM | POA: Diagnosis present

## 2015-07-11 DIAGNOSIS — Z9221 Personal history of antineoplastic chemotherapy: Secondary | ICD-10-CM | POA: Insufficient documentation

## 2015-07-11 DIAGNOSIS — J341 Cyst and mucocele of nose and nasal sinus: Secondary | ICD-10-CM | POA: Insufficient documentation

## 2015-07-11 DIAGNOSIS — R911 Solitary pulmonary nodule: Secondary | ICD-10-CM | POA: Insufficient documentation

## 2015-07-11 DIAGNOSIS — N281 Cyst of kidney, acquired: Secondary | ICD-10-CM | POA: Diagnosis not present

## 2015-07-11 DIAGNOSIS — Z9484 Stem cells transplant status: Secondary | ICD-10-CM | POA: Insufficient documentation

## 2015-07-11 DIAGNOSIS — C8195 Hodgkin lymphoma, unspecified, lymph nodes of inguinal region and lower limb: Secondary | ICD-10-CM

## 2015-07-11 LAB — GLUCOSE, CAPILLARY: GLUCOSE-CAPILLARY: 88 mg/dL (ref 65–99)

## 2015-07-11 MED ORDER — FLUDEOXYGLUCOSE F - 18 (FDG) INJECTION
8.7400 | Freq: Once | INTRAVENOUS | Status: DC | PRN
  Administered 2015-07-11: 8.74 via INTRAVENOUS
  Filled 2015-07-11: qty 8.74

## 2015-07-13 ENCOUNTER — Telehealth: Payer: Self-pay | Admitting: Hematology & Oncology

## 2015-07-13 NOTE — Telephone Encounter (Signed)
Returned pt call regarding his appt in Sept and lt md message to call pt.

## 2015-07-14 ENCOUNTER — Ambulatory Visit (INDEPENDENT_AMBULATORY_CARE_PROVIDER_SITE_OTHER): Payer: Commercial Managed Care - PPO | Admitting: Thoracic Surgery (Cardiothoracic Vascular Surgery)

## 2015-07-14 ENCOUNTER — Encounter: Payer: Self-pay | Admitting: Thoracic Surgery (Cardiothoracic Vascular Surgery)

## 2015-07-14 ENCOUNTER — Other Ambulatory Visit: Payer: Self-pay | Admitting: *Deleted

## 2015-07-14 VITALS — BP 124/78 | HR 71 | Resp 16 | Ht 70.0 in | Wt 175.0 lb

## 2015-07-14 DIAGNOSIS — C801 Malignant (primary) neoplasm, unspecified: Secondary | ICD-10-CM

## 2015-07-14 DIAGNOSIS — R590 Localized enlarged lymph nodes: Secondary | ICD-10-CM | POA: Insufficient documentation

## 2015-07-14 DIAGNOSIS — R599 Enlarged lymph nodes, unspecified: Secondary | ICD-10-CM | POA: Diagnosis not present

## 2015-07-14 DIAGNOSIS — C8190 Hodgkin lymphoma, unspecified, unspecified site: Secondary | ICD-10-CM

## 2015-07-14 NOTE — Progress Notes (Signed)
PCP is DAUB, Lina Sayre, MD Referring Provider is [name], MD  Chief Complaint  Patient presents with  . Follow-up    for recurrent Hodgkin's...PET 07/11/15.Marland KitchenMarland KitchenPORT-A-CATH in place    HPI: Brian [name] is sent for consultation regarding a mediastinal adenopathy.  Brian [name] is a 54 year old man with a history of nodular sclerosing Hodgkin's disease. He has been treated with chemotherapy, salvage chemotherapy, total body radiation and autologous stem cell transplant, and most recently Adcetris. I did a left VATS and resected a pleural-based mass which turned out to be recurrent Hodgkin's disease in July 2015. He received 12 of 16 planned cycles of that Adcetris, but it was stopped after he had a TIA, which was characterized by a few minutes of slurred speech. He has not had any other symptoms since that isolated event.  He recently saw Dr. Marin Olp and had a PET/CT done for follow-up. It showed hypermetabolic lymph nodes in the left prevascular area and along the bronchus intermedius on the right side.  He says he has been feeling well. He is not having any fevers, chills. He did have one night sweat 2 weeks ago that he thinks is due to the temperature in the room. That has not recurred. His appetite is good. His weight is stable. He denies headaches or visual changes. He would not know there was an issue had he not been scanned.     Past Medical History  Diagnosis Date  . Wears contact lenses   . H/O autologous stem cell transplant     may 2014  at [facility]  . History of peptic ulcer   . Mass of right inguinal region   . History of Bell's palsy     2009  RIGHT SIDE--  HAS 80% FUNCTION / PT STATES A LITTLE ASYMETRICAL AND EFFECTS MOUTH  . Hodgkin's disease, nodular sclerosis, of inguinal region/lower limb ONOLOGIST--  DR [facility]NE[facility] AND A [facility]      SALVAGE CHEMO 2013/  AUTOLOGUS STEM CELL TRANSPLANT MAY 2014 AT [facility]  . Dysrhythmia     past hx pvc  . Anxiety   . PVC (premature ventricular  contraction)     "benign"    Past Surgical History  Procedure Laterality Date  . Axillary lymph node biopsy Left 02/02/2013    Procedure: NEEDLE LOCALIZED AXILLARY LYMPH NODE BIOPSY;  Surgeon: Haywood Lasso, MD;  Location: [place];  Service: General;  Laterality: Left;  . Cyst removal neck Left 1980  . Removal right inguinal lymph nodes  08-16-2011  . Left inguinal lymph node bx  09-09-2012  . Transthoracic echocardiogram  03-18-2013    MILD LVH/  EF 55-60%  . Scrotal exploration Right 01/04/2014    Procedure: SCROTUM EXPLORATION   INGUINAL , EXCISION OF CYSTIC MASS OF RIGHT SPERMATIC CORD, WITH FROZEN SECTION;  Surgeon: Franchot Gallo, MD;  Location: [facility];  Service: Urology;  Laterality: Right;  . Pleura biopsy Left 05/31/2014  . Video assisted thoracoscopy (vats)/wedge resection  05/31/2014  . Bone marrow biopsy  08/2012  . Video assisted thoracoscopy Left 05/31/2014    Procedure: LEFT VIDEO ASSISTED THORACOSCOPY, PLEURAL BIOPSY;  Surgeon: Melrose Nakayama, MD;  Location: [place];  Service: Thoracic;  Laterality: Left;    Family History  Problem Relation Age of Onset  . Cancer Mother 57    ovarian  . Heart disease Father   . Stroke Father   . Cancer Sister     ovarian  .  Cancer Brother     testicular    Social History Social History  Substance Use Topics  . Smoking status: Never Smoker   . Smokeless tobacco: Never Used     Comment: never smoked tobacco  . Alcohol Use: 2.4 oz/week    4 Cans of beer per week    Current Outpatient Prescriptions  Medication Sig Dispense Refill  . acetaminophen (TYLENOL) 325 MG tablet Take 325 mg by mouth every 6 (six) hours as needed for mild pain.    Marland Kitchen ALPRAZolam (XANAX) 0.5 MG tablet TAKE 1 TABLET BY MOUTH AT BEDTIME AS NEEDED FOR SLEEP 30 tablet 2  . aspirin 81 MG chewable tablet Chew 1 tablet (81 mg total) by mouth daily. 30 tablet 0  . Cholecalciferol (VITAMIN D-3) 1000 UNITS CAPS Take by  mouth 2 (two) times daily.    . citalopram (CELEXA) 20 MG tablet Take 1 tablet (20 mg total) by mouth daily. 30 tablet 6  . dexamethasone (DECADRON) 4 MG tablet Take 2 tablets (8 mg total) by mouth 2 (two) times daily with a meal. Start the day after chemotherapy for 2 days. Take with food. 30 tablet 1  . ibuprofen (ADVIL,MOTRIN) 200 MG tablet Take 200-400 mg by mouth every 6 (six) hours as needed for mild pain.     Marland Kitchen lisinopril (PRINIVIL,ZESTRIL) 2.5 MG tablet Take 1 tablet (2.5 mg total) by mouth daily. 30 tablet 6  . LORazepam (ATIVAN) 1 MG tablet TAKE 1/2 A TABLET BY MOUTH TWICE DAILY DURING THE DAY FOR ANXIETY, AND 1 TABLET AT BEDTIME IF NEEDED 20 tablet 3  . metoprolol tartrate (LOPRESSOR) 25 MG tablet Take 0.5 tablets (12.5 mg total) by mouth 2 (two) times daily. 60 tablet 6  . Multiple Vitamin (MULTIVITAMIN) tablet Take 1 tablet by mouth daily.    Marland Kitchen lidocaine-prilocaine (EMLA) cream Apply 1 application topically as needed. Apply nickel sized amount to portacath site at least 1 hour prior to chemotherapy. (Patient not taking: Reported on 07/14/2015) 30 g 1   No current facility-administered medications for this visit.   Facility-Administered Medications Ordered in Other Visits  Medication Dose Route Frequency Provider Last Rate Last Dose  . fludeoxyglucose F - 18 (FDG) injection 8.74 milli Curie  8.74 milli Curie Intravenous Once PRN Medication Radiologist, MD   8.74 milli Curie at 07/11/15 [phone]    No Known Allergies  Review of Systems  Constitutional: Negative for fever, chills, diaphoresis, activity change, appetite change, fatigue and unexpected weight change.  Respiratory: Negative for cough, shortness of breath, wheezing and stridor.   Cardiovascular: Negative for chest pain and leg swelling.  Gastrointestinal: Negative for abdominal pain.  Genitourinary: Positive for urgency, frequency and difficulty urinating.  Musculoskeletal:       Paresthesias left costal margin   Neurological: Positive for dizziness, weakness and headaches.  Hematological: Positive for adenopathy. Bruises/bleeds easily.  All other systems reviewed and are negative.   BP 124/78 mmHg  Pulse 71  Resp 16  Ht _0  (1.778 m)  Wt 175 lb (79.379 kg)  BMI 25.11 kg/m2  SpO2 99% Physical Exam  Constitutional: He appears well-developed and well-nourished. No distress.  HENT:  Head: Normocephalic and atraumatic.  Eyes: Conjunctivae and EOM are normal. No scleral icterus.  Neck: Normal range of motion. No thyromegaly present.  Cardiovascular: Normal rate, regular rhythm, normal heart sounds and intact distal pulses.  Exam reveals no gallop and no friction rub.   No murmur heard. Pulmonary/Chest: Effort normal and breath sounds normal.  He has no wheezes.  Well healed scars left chest  Abdominal: Soft. Bowel sounds are normal. He exhibits no distension. There is no tenderness.  Musculoskeletal: Normal range of motion. He exhibits no edema.  Lymphadenopathy:    He has no cervical adenopathy.  Neurological: No cranial nerve deficit. He exhibits normal muscle tone.  Motor 5/5 bilaterally  Skin: Skin is warm.  Psychiatric: He has a normal mood and affect.  Vitals reviewed.    Diagnostic Tests: NUCLEAR MEDICINE PET SKULL BASE TO THIGH  TECHNIQUE: 8.7 mCi F-18 FDG was injected intravenously. Full-ring PET imaging was performed from the skull base to thigh after the radiotracer. CT data was obtained and used for attenuation correction and anatomic localization.  FASTING BLOOD GLUCOSE: Value: 88 mg/dl  COMPARISON: Most recent PET-CT from 04/12/2015.  FINDINGS: NECK  No hypermetabolic lymph nodes in the neck. Stable non FDG avid mucous retention cysts in the lower right maxillary sinus.  CHEST  There is new hypermetabolism within an enlarging 0.8 cm short axis prevascular left mediastinal node (series 4/image 63) with max SUV 3.6, previously 0.4 cm. There is new  hypermetabolism within an enlarging 0.8 cm short axis subcarinal node (4/78) with max SUV 4.8, previously 0.4 cm. There are no additional hypermetabolic mediastinal nodes. Non hypermetabolic top-normal size subcentimeter prevascular nodes are not appreciably changed in size, including a 0.8 cm left prevascular node (4/67) and a 0.7 cm right prevascular node (4/69).  No hypermetabolic axillary or hilar nodes. A 0.8 cm left upper lobe subsolid pulmonary nodule (series 6/ image 41) is not appreciably changed since 12/26/2011 chest CT, in keeping with a benign etiology. No new significant pulmonary nodules on the CT scan. No acute consolidative airspace disease. Right internal jugular MediPort terminates at the cavoatrial junction. There are stable surgical clips in the left posterior pleural space, with no pleural effusions.  ABDOMEN/PELVIS  No abnormal hypermetabolic activity within the liver, pancreas, adrenal glands, or spleen. No hypermetabolic lymph nodes in the abdomen or pelvis. Spleen is normal size, with no splenic hypermetabolism. Stable simple 1.3 cm renal cyst in lateral upper left kidney. Stable mild prostatomegaly with nonspecific coarse internal prostatic calcifications.  SKELETON  No focal hypermetabolic activity to suggest skeletal metastasis.  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   Electronically Signed  By: Ilona Sorrel M.D.  On: 07/11/2015 11:42        Impression: Brian [name] is a 54 year old man with a history of nodular sclerosing Hodgkin's disease with multiple recurrences. He most recently was being treated with Adcetris, but that had to be discontinued after he had an episode of slurred speech. A recent PET/CT shows hypermetabolic left prevascular and right subcarinal adenopathy. The concern is that this could represent recurrent disease. If it is recurrent disease he may be a  candidate for immune therapy.  Unfortunately neither of these nodes is particularly easy to access. The node on the left side is under the manubrium. The node on the right side is accessible with endobronchial ultrasound, but unfortunately that may not yield a definitive diagnosis. It is too far down along the right bronchus to reach with mediastinoscopy. It is approachable with a right thoracoscopy. We did discuss the option of waiting with repeat radiographic follow-up as it is possible these are just reactive nodes and also to see if another node in it more accessible location became evident. Given his history of multiple recurrences he would like to proceed with biopsy now.  I  recommended that we try endobronchial ultrasound and node aspiration and possible right VATS for node biopsy. I discussed the indications, risks, benefits, and alternatives. He understands the general nature of the procedure an intraoperative decision making. He understands the risk include, but are not limited to MI, DVT, PE, bleeding, possible need for transfusion, infection, as well as the possibility of other unforeseeable complications.  Plan:  Endobronchial ultrasound, possible right VATS for lymph node biopsy on Thursday, 07/28/2015.  Melrose Nakayama, MD [facility] [phone]

## 2015-07-18 ENCOUNTER — Encounter: Payer: Self-pay | Admitting: Hematology & Oncology

## 2015-07-18 ENCOUNTER — Ambulatory Visit (HOSPITAL_BASED_OUTPATIENT_CLINIC_OR_DEPARTMENT_OTHER): Payer: Commercial Managed Care - PPO | Admitting: Hematology & Oncology

## 2015-07-18 ENCOUNTER — Ambulatory Visit (HOSPITAL_BASED_OUTPATIENT_CLINIC_OR_DEPARTMENT_OTHER): Payer: Commercial Managed Care - PPO

## 2015-07-18 ENCOUNTER — Other Ambulatory Visit (HOSPITAL_BASED_OUTPATIENT_CLINIC_OR_DEPARTMENT_OTHER): Payer: Commercial Managed Care - PPO

## 2015-07-18 VITALS — BP 120/75 | HR 65 | Temp 97.2°F | Resp 16 | Ht 70.0 in | Wt 178.0 lb

## 2015-07-18 DIAGNOSIS — Z9484 Stem cells transplant status: Secondary | ICD-10-CM | POA: Diagnosis not present

## 2015-07-18 DIAGNOSIS — C8195 Hodgkin lymphoma, unspecified, lymph nodes of inguinal region and lower limb: Secondary | ICD-10-CM | POA: Diagnosis not present

## 2015-07-18 LAB — CBC WITH DIFFERENTIAL (CANCER CENTER ONLY)
BASO#: 0 10*3/uL (ref 0.0–0.2)
BASO%: 0.6 % (ref 0.0–2.0)
EOS%: 4.1 % (ref 0.0–7.0)
Eosinophils Absolute: 0.2 10*3/uL (ref 0.0–0.5)
HEMATOCRIT: 38.7 % (ref 38.7–49.9)
HEMOGLOBIN: 13.4 g/dL (ref 13.0–17.1)
LYMPH#: 1 10*3/uL (ref 0.9–3.3)
LYMPH%: 18.3 % (ref 14.0–48.0)
MCH: 34 pg — ABNORMAL HIGH (ref 28.0–33.4)
MCHC: 34.6 g/dL (ref 32.0–35.9)
MCV: 98 fL (ref 82–98)
MONO#: 0.8 10*3/uL (ref 0.1–0.9)
MONO%: 14.7 % — ABNORMAL HIGH (ref 0.0–13.0)
NEUT%: 62.3 % (ref 40.0–80.0)
NEUTROS ABS: 3.2 10*3/uL (ref 1.5–6.5)
Platelets: 212 10*3/uL (ref 145–400)
RBC: 3.94 10*6/uL — ABNORMAL LOW (ref 4.20–5.70)
RDW: 12.8 % (ref 11.1–15.7)
WBC: 5.2 10*3/uL (ref 4.0–10.0)

## 2015-07-18 LAB — CMP (CANCER CENTER ONLY)
ALBUMIN: 3.7 g/dL (ref 3.3–5.5)
ALK PHOS: 67 U/L (ref 26–84)
ALT: 30 U/L (ref 10–47)
AST: 30 U/L (ref 11–38)
BUN: 21 mg/dL (ref 7–22)
CO2: 28 mEq/L (ref 18–33)
CREATININE: 1.3 mg/dL — AB (ref 0.6–1.2)
Calcium: 9.5 mg/dL (ref 8.0–10.3)
Chloride: 102 mEq/L (ref 98–108)
Glucose, Bld: 96 mg/dL (ref 73–118)
Potassium: 4.2 mEq/L (ref 3.3–4.7)
SODIUM: 141 meq/L (ref 128–145)
TOTAL PROTEIN: 6.8 g/dL (ref 6.4–8.1)
Total Bilirubin: 0.7 mg/dl (ref 0.20–1.60)

## 2015-07-18 MED ORDER — HEPARIN SOD (PORK) LOCK FLUSH 100 UNIT/ML IV SOLN
500.0000 [IU] | Freq: Once | INTRAVENOUS | Status: AC
  Administered 2015-07-18: 500 [IU] via INTRAVENOUS
  Filled 2015-07-18: qty 5

## 2015-07-18 MED ORDER — SODIUM CHLORIDE 0.9 % IJ SOLN
10.0000 mL | INTRAMUSCULAR | Status: DC | PRN
  Administered 2015-07-18: 10 mL via INTRAVENOUS
  Filled 2015-07-18: qty 10

## 2015-07-18 NOTE — Progress Notes (Signed)
Hematology and Oncology Follow Up Visit  Brian [name] [id number] 1961/05/31 54 y.o. 07/18/2015   Principle Diagnosis:  Recurrent Hodgkin's Disease -  S/p autologous transplant  TIA-resolved  Current Therapy:    S/p Adcetris x 12 cycles     Interim History:  [name] is in for followup. We may have a problem now. I went ahead and did a PET scan on him 2 weeks ago. Surprising enough, the PET scan showed new activity in the mediastinum. 2 lymph nodes, both less than 1 cm, have activity. No activity was noted elsewhere.  He feels well. He's been working. He's not exercising. His appetite has been good.  He's had no cough. He's had no chest wall pain. He still has the numbness in the left upper quadrant of his abdomen from where he had passed VATS surgery.  He has already seen Dr. Roxan Hockey of thoracic surgery. That her headaches and will taken to surgery on September 1.  He has not had any, weight loss. He's had no rashes. He's had no pruritus.  This been no further neurological issues. It sounds like he had a TIA back in the springtime. He is on baby aspirin for this. Overall, his performance status is ECOG 0. Medications:  Current outpatient prescriptions:  .  acetaminophen (TYLENOL) 325 MG tablet, Take 325 mg by mouth every 6 (six) hours as needed for mild pain., Disp: , Rfl:  .  ALPRAZolam (XANAX) 0.5 MG tablet, TAKE 1 TABLET BY MOUTH AT BEDTIME AS NEEDED FOR SLEEP, Disp: 30 tablet, Rfl: 2 .  aspirin 81 MG chewable tablet, Chew 1 tablet (81 mg total) by mouth daily., Disp: 30 tablet, Rfl: 0 .  Cholecalciferol (VITAMIN D-3) 1000 UNITS CAPS, Take by mouth 2 (two) times daily., Disp: , Rfl:  .  citalopram (CELEXA) 20 MG tablet, Take 1 tablet (20 mg total) by mouth daily., Disp: 30 tablet, Rfl: 6 .  dexamethasone (DECADRON) 4 MG tablet, Take 2 tablets (8 mg total) by mouth 2 (two) times daily with a meal. Start the day after chemotherapy for 2 days. Take with food., Disp: 30 tablet,  Rfl: 1 .  ibuprofen (ADVIL,MOTRIN) 200 MG tablet, Take 200-400 mg by mouth every 6 (six) hours as needed for mild pain. , Disp: , Rfl:  .  lidocaine-prilocaine (EMLA) cream, Apply 1 application topically as needed. Apply nickel sized amount to portacath site at least 1 hour prior to chemotherapy., Disp: 30 g, Rfl: 1 .  lisinopril (PRINIVIL,ZESTRIL) 2.5 MG tablet, Take 1 tablet (2.5 mg total) by mouth daily., Disp: 30 tablet, Rfl: 6 .  LORazepam (ATIVAN) 1 MG tablet, TAKE 1/2 A TABLET BY MOUTH TWICE DAILY DURING THE DAY FOR ANXIETY, AND 1 TABLET AT BEDTIME IF NEEDED, Disp: 20 tablet, Rfl: 3 .  metoprolol tartrate (LOPRESSOR) 25 MG tablet, Take 0.5 tablets (12.5 mg total) by mouth 2 (two) times daily., Disp: 60 tablet, Rfl: 6 .  Multiple Vitamin (MULTIVITAMIN) tablet, Take 1 tablet by mouth daily., Disp: , Rfl:  No current facility-administered medications for this visit.  Facility-Administered Medications Ordered in Other Visits:  .  sodium chloride 0.9 % injection 10 mL, 10 mL, Intravenous, PRN, [name], MD, 10 mL at 07/18/15 1431  Allergies: No Known Allergies  Past Medical History, Surgical history, Social history, and Family History were reviewed and updated.  Review of Systems: As above  Physical Exam:  height is 5\' 10"  (1.778 m) and weight is 178 lb (80.74 kg). His  oral temperature is 97.2 F (36.2 C). His blood pressure is 120/75 and his pulse is 65. His respiration is 16.   Well-developed and well-nourished white gentleman. Head and neck exam shows no ocular or oral lesions. There are no palpable cervical or supraclavicular lymph nodes. Lungs are clear. Cardiac exam regular rate and rhythm with no murmurs, rubs or bruits.. Abdomen is soft. He has good bowel sounds. There is no fluid wave. There is no palpable liver or spleen tip. Back exam shows a thoracoscopy site in the left lateral chest wall. This is healed. There is no swelling or erythema. Extremities shows no clubbing,  cyanosis or edema. Skin exam shows no rashes, ecchymoses or petechia . Neurological exam is nonfocal.  Lab Results  Component Value Date   WBC 5.2 07/18/2015   HGB 13.4 07/18/2015   HCT 38.7 07/18/2015   MCV 98 07/18/2015   PLT 212 07/18/2015     Chemistry      Component Value Date/Time   NA 141 07/18/2015 0916   NA 138 03/04/2015 1908   NA 142 03/25/2013 0828   K 4.2 07/18/2015 0916   K 4.0 03/04/2015 1908   K 4.1 03/25/2013 0828   CL 102 07/18/2015 0916   CL 102 03/04/2015 1908   CL 107 03/25/2013 0828   CO2 28 07/18/2015 0916   CO2 25 03/04/2015 1908   CO2 27 03/25/2013 0828   BUN 21 07/18/2015 0916   BUN 23 03/04/2015 1908   BUN 12.7 03/25/2013 0828   CREATININE 1.3* 07/18/2015 0916   CREATININE 1.05 03/04/2015 1908   CREATININE 0.9 03/25/2013 0828      Component Value Date/Time   CALCIUM 9.5 07/18/2015 0916   CALCIUM 9.4 03/04/2015 1908   CALCIUM 9.2 03/25/2013 0828   ALKPHOS 67 07/18/2015 0916   ALKPHOS 65 03/04/2015 1908   ALKPHOS 122 03/25/2013 0828   AST 30 07/18/2015 0916   AST 31 03/04/2015 1908   AST 33 03/25/2013 0828   ALT 30 07/18/2015 0916   ALT 31 03/04/2015 1908   ALT 65* 03/25/2013 0828   BILITOT 0.70 07/18/2015 0916   BILITOT 0.8 03/04/2015 1908   BILITOT 0.31 03/25/2013 0828         Impression and Plan: Brian [name] is a 54 year old gentleman.  He has history of recurrent Hodgkin's disease. 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 He then had a recurrence after this.  At this point, I think we're going to have to get a tissue diagnosis.  I reviewed the PET scan with Brian [name]. The problem that we have is that these lymph nodes are not that large and are in a very difficult location to access.  He will have his surgery on September 1.  If we do document that he has recurrence, no think that I would try to get him into a clinical trial utilizing CAR-T cell therapy. This is being looked at at [facility].  The  other option I think if we do find recurrent disease would be to consider Opdivo. This is now FDA approved for relapsed Hodgkin's disease.  I spent about 45 minutes with him today. This is a very delicate situation and I want to make sure that we all are on the "same page".    Cassell Smiles, MD 8/22/20165:05 PM

## 2015-07-18 NOTE — Progress Notes (Signed)
Brian [name] presented for Portacath access and flush. Proper placement of portacath confirmed by CXR. Portacath located in the right chest wall accessed with  H 20 needle. Clean, Dry and Intact Good blood return present. Portacath flushed with 35ml NS and 500U/66ml Heparin per protocol and needle removed intact. Procedure without incident. Patient tolerated procedure well.

## 2015-07-18 NOTE — Patient Instructions (Signed)

## 2015-07-19 LAB — LACTATE DEHYDROGENASE: LDH: 171 U/L (ref 94–250)

## 2015-07-19 LAB — VITAMIN D 25 HYDROXY (VIT D DEFICIENCY, FRACTURES): VIT D 25 HYDROXY: 52 ng/mL (ref 30–100)

## 2015-07-25 NOTE — Pre-Procedure Instructions (Signed)
    Brian [name]  07/25/2015      [facility] [address], [place] [employer] [place] [address] Phone: [phone] Fax: [phone]    Your procedure is scheduled on 07/28/15.  Report to [facility] Admitting at 530 A.M.  Call this number if you have problems the morning of surgery:  [phone]   Remember:  Do not eat food or drink liquids after midnight.  Take these medicines the morning of surgery with A SIP OF WATER --xanax,tylenol,celexa,metoprolol   Do not wear jewelry, make-up or nail polish.  Do not wear lotions, powders, or perfumes.  You may wear deodorant.  Do not shave 48 hours prior to surgery.  Men may shave face and neck.  Do not bring valuables to the hospital.  [facility] is not responsible for any belongings or valuables.  Contacts, dentures or bridgework may not be worn into surgery.  Leave your suitcase in the car.  After surgery it may be brought to your room.  For patients admitted to the hospital, discharge time will be determined by your treatment team.  Patients discharged the day of surgery will not be allowed to drive home.   Name and phone number of your driver:   Special instructions:    Please read over the following fact sheets that you were given. Pain Booklet, Coughing and Deep Breathing, Blood Transfusion Information, MRSA Information and Surgical Site Infection Prevention

## 2015-07-26 ENCOUNTER — Encounter (HOSPITAL_COMMUNITY)
Admission: RE | Admit: 2015-07-26 | Discharge: 2015-07-26 | Disposition: A | Discharge status: Home / Self Care | Payer: Commercial Managed Care - PPO | Source: Ambulatory Visit | Attending: Thoracic Surgery (Cardiothoracic Vascular Surgery) | Admitting: Thoracic Surgery (Cardiothoracic Vascular Surgery)

## 2015-07-26 ENCOUNTER — Other Ambulatory Visit: Payer: Self-pay

## 2015-07-26 ENCOUNTER — Encounter (HOSPITAL_COMMUNITY): Payer: Self-pay

## 2015-07-26 ENCOUNTER — Ambulatory Visit: Payer: Commercial Managed Care - PPO | Admitting: Thoracic Surgery (Cardiothoracic Vascular Surgery)

## 2015-07-26 DIAGNOSIS — C801 Malignant (primary) neoplasm, unspecified: Secondary | ICD-10-CM

## 2015-07-26 LAB — APTT: APTT: 25 s (ref 24–37)

## 2015-07-26 LAB — URINALYSIS, ROUTINE W REFLEX MICROSCOPIC
BILIRUBIN URINE: NEGATIVE
GLUCOSE, UA: NEGATIVE mg/dL
HGB URINE DIPSTICK: NEGATIVE
KETONES UR: NEGATIVE mg/dL
LEUKOCYTES UA: NEGATIVE
Nitrite: NEGATIVE
PH: 6 (ref 5.0–8.0)
PROTEIN: NEGATIVE mg/dL
Specific Gravity, Urine: 1.016 (ref 1.005–1.030)
Urobilinogen, UA: 0.2 mg/dL (ref 0.0–1.0)

## 2015-07-26 LAB — COMPREHENSIVE METABOLIC PANEL
ALT: 23 U/L (ref 17–63)
AST: 23 U/L (ref 15–41)
Albumin: 3.9 g/dL (ref 3.5–5.0)
Alkaline Phosphatase: 61 U/L (ref 38–126)
Anion gap: 7 (ref 5–15)
BUN: 26 mg/dL — ABNORMAL HIGH (ref 6–20)
CHLORIDE: 106 mmol/L (ref 101–111)
CO2: 25 mmol/L (ref 22–32)
CREATININE: 0.97 mg/dL (ref 0.61–1.24)
Calcium: 9.6 mg/dL (ref 8.9–10.3)
Glucose, Bld: 96 mg/dL (ref 65–99)
POTASSIUM: 4.3 mmol/L (ref 3.5–5.1)
SODIUM: 138 mmol/L (ref 135–145)
Total Bilirubin: 0.9 mg/dL (ref 0.3–1.2)
Total Protein: 6.7 g/dL (ref 6.5–8.1)

## 2015-07-26 LAB — CBC
HCT: 38.3 % — ABNORMAL LOW (ref 39.0–52.0)
Hemoglobin: 13.4 g/dL (ref 13.0–17.0)
MCH: 33.5 pg (ref 26.0–34.0)
MCHC: 35 g/dL (ref 30.0–36.0)
MCV: 95.8 fL (ref 78.0–100.0)
PLATELETS: 193 10*3/uL (ref 150–400)
RBC: 4 MIL/uL — AB (ref 4.22–5.81)
RDW: 12.7 % (ref 11.5–15.5)
WBC: 4.8 10*3/uL (ref 4.0–10.5)

## 2015-07-26 LAB — SURGICAL PCR SCREEN
MRSA, PCR: NEGATIVE
Staphylococcus aureus: NEGATIVE

## 2015-07-26 LAB — BLOOD GAS, ARTERIAL
ACID-BASE EXCESS: 1 mmol/L (ref 0.0–2.0)
Bicarbonate: 24.5 mEq/L — ABNORMAL HIGH (ref 20.0–24.0)
DRAWN BY: 421801
FIO2: 0.21
O2 SAT: 98.9 %
Patient temperature: 98.6
TCO2: 25.6 mmol/L (ref 0–100)
pCO2 arterial: 35.1 mmHg (ref 35.0–45.0)
pH, Arterial: 7.458 — ABNORMAL HIGH (ref 7.350–7.450)
pO2, Arterial: 118 mmHg — ABNORMAL HIGH (ref 80.0–100.0)

## 2015-07-26 LAB — TYPE AND SCREEN
ABO/RH(D): O POS
ANTIBODY SCREEN: NEGATIVE

## 2015-07-26 LAB — PROTIME-INR
INR: 1.18 (ref 0.00–1.49)
PROTHROMBIN TIME: 15.2 s (ref 11.6–15.2)

## 2015-07-27 MED ORDER — DEXTROSE 5 % IV SOLN
1.5000 g | INTRAVENOUS | Status: AC
  Administered 2015-07-28: 1.5 g via INTRAVENOUS
  Filled 2015-07-27: qty 1.5

## 2015-07-27 NOTE — Anesthesia Preprocedure Evaluation (Addendum)
Anesthesia Evaluation  Patient identified by MRN, date of birth, ID band Patient awake    Reviewed: Allergy & Precautions, H&P , NPO status , Patient's Chart, lab work & pertinent test results, reviewed documented beta blocker date and time   Airway Mallampati: II  TM Distance: >3 FB Neck ROM: Full    Dental no notable dental hx.    Pulmonary neg pulmonary ROS,  breath sounds clear to auscultation  Pulmonary exam normal       Cardiovascular hypertension, Pt. on home beta blockers Normal cardiovascular exam+ dysrhythmias Rhythm:Regular Rate:Normal     Neuro/Psych PSYCHIATRIC DISORDERS Anxiety CVA negative psych ROS   GI/Hepatic negative GI ROS, Neg liver ROS,   Endo/Other  negative endocrine ROS  Renal/GU negative Renal ROS Bladder dysfunction: Bell's palsy       Musculoskeletal negative musculoskeletal ROS (+)   Abdominal   Peds  Hematology negative hematology ROS (+)   Anesthesia Other Findings   Reproductive/Obstetrics negative OB ROS                             Anesthesia Physical  Anesthesia Plan  ASA: III  Anesthesia Plan: General   Post-op Pain Management:    Induction: Intravenous  Airway Management Planned: Oral ETT and Double Lumen EBT  Additional Equipment: Arterial line  Intra-op Plan:   Post-operative Plan: Extubation in OR  Informed Consent: I have reviewed the patients History and Physical, chart, labs and discussed the procedure including the risks, benefits and alternatives for the proposed anesthesia with the patient or authorized representative who has indicated his/her understanding and acceptance.   Dental advisory given  Plan Discussed with: CRNA  Anesthesia Plan Comments:        Anesthesia Quick Evaluation

## 2015-07-28 ENCOUNTER — Encounter (HOSPITAL_COMMUNITY)
Admission: AD | Disposition: A | Discharge status: Home / Self Care | Payer: Self-pay | Source: Ambulatory Visit | Attending: Thoracic Surgery (Cardiothoracic Vascular Surgery)

## 2015-07-28 ENCOUNTER — Encounter (HOSPITAL_COMMUNITY): Payer: Self-pay | Admitting: General Practice

## 2015-07-28 ENCOUNTER — Ambulatory Visit (HOSPITAL_COMMUNITY): Payer: Commercial Managed Care - PPO | Admitting: Emergency Medicine

## 2015-07-28 ENCOUNTER — Ambulatory Visit (HOSPITAL_COMMUNITY): Payer: Commercial Managed Care - PPO | Admitting: Anesthesiology

## 2015-07-28 ENCOUNTER — Inpatient Hospital Stay (HOSPITAL_COMMUNITY)
Admission: AD | Admit: 2015-07-28 | Discharge: 2015-07-30 | DRG: 824 | Disposition: A | Discharge status: Home / Self Care | Payer: Commercial Managed Care - PPO | Source: Ambulatory Visit | Attending: Thoracic Surgery (Cardiothoracic Vascular Surgery) | Admitting: Thoracic Surgery (Cardiothoracic Vascular Surgery)

## 2015-07-28 ENCOUNTER — Inpatient Hospital Stay (HOSPITAL_COMMUNITY): Payer: Commercial Managed Care - PPO

## 2015-07-28 DIAGNOSIS — C8110 Nodular sclerosis classical Hodgkin lymphoma, unspecified site: Principal | ICD-10-CM | POA: Diagnosis present

## 2015-07-28 DIAGNOSIS — R591 Generalized enlarged lymph nodes: Secondary | ICD-10-CM | POA: Diagnosis present

## 2015-07-28 DIAGNOSIS — J9811 Atelectasis: Secondary | ICD-10-CM | POA: Diagnosis present

## 2015-07-28 DIAGNOSIS — Z8673 Personal history of transient ischemic attack (TIA), and cerebral infarction without residual deficits: Secondary | ICD-10-CM | POA: Diagnosis not present

## 2015-07-28 DIAGNOSIS — Z79899 Other long term (current) drug therapy: Secondary | ICD-10-CM | POA: Diagnosis not present

## 2015-07-28 DIAGNOSIS — Z4682 Encounter for fitting and adjustment of non-vascular catheter: Secondary | ICD-10-CM

## 2015-07-28 DIAGNOSIS — Z9221 Personal history of antineoplastic chemotherapy: Secondary | ICD-10-CM | POA: Diagnosis not present

## 2015-07-28 DIAGNOSIS — C8190 Hodgkin lymphoma, unspecified, unspecified site: Secondary | ICD-10-CM | POA: Diagnosis not present

## 2015-07-28 DIAGNOSIS — C801 Malignant (primary) neoplasm, unspecified: Secondary | ICD-10-CM

## 2015-07-28 DIAGNOSIS — J90 Pleural effusion, not elsewhere classified: Secondary | ICD-10-CM | POA: Diagnosis present

## 2015-07-28 DIAGNOSIS — Z809 Family history of malignant neoplasm, unspecified: Secondary | ICD-10-CM

## 2015-07-28 DIAGNOSIS — J95811 Postprocedural pneumothorax: Secondary | ICD-10-CM | POA: Diagnosis not present

## 2015-07-28 DIAGNOSIS — Z823 Family history of stroke: Secondary | ICD-10-CM

## 2015-07-28 DIAGNOSIS — R590 Localized enlarged lymph nodes: Secondary | ICD-10-CM | POA: Diagnosis present

## 2015-07-28 DIAGNOSIS — Z791 Long term (current) use of non-steroidal anti-inflammatories (NSAID): Secondary | ICD-10-CM

## 2015-07-28 DIAGNOSIS — Z7982 Long term (current) use of aspirin: Secondary | ICD-10-CM | POA: Diagnosis not present

## 2015-07-28 HISTORY — PX: VIDEO BRONCHOSCOPY WITH ENDOBRONCHIAL ULTRASOUND: SHX6177

## 2015-07-28 HISTORY — PX: VIDEO ASSISTED THORACOSCOPY (VATS)/ LYMPH NODE SAMPLING: SHX6170

## 2015-07-28 HISTORY — PX: LYMPH NODE BIOPSY: SHX201

## 2015-07-28 HISTORY — DX: Transient cerebral ischemic attack, unspecified: G45.9

## 2015-07-28 HISTORY — PX: NODE DISSECTION: SHX5269

## 2015-07-28 HISTORY — PX: VIDEO ASSISTED THORACOSCOPY: SHX5073

## 2015-07-28 SURGERY — BRONCHOSCOPY, WITH EBUS
Anesthesia: General | Site: Chest | Laterality: Right

## 2015-07-28 MED ORDER — NEOSTIGMINE METHYLSULFATE 10 MG/10ML IV SOLN
INTRAVENOUS | Status: DC | PRN
Start: 2015-07-28 — End: 2015-07-28
  Administered 2015-07-28: 3 mg via INTRAVENOUS

## 2015-07-28 MED ORDER — HEMOSTATIC AGENTS (NO CHARGE) OPTIME
TOPICAL | Status: DC | PRN
  Administered 2015-07-28: 1 via TOPICAL

## 2015-07-28 MED ORDER — HYDROMORPHONE HCL 1 MG/ML IJ SOLN
INTRAMUSCULAR | Status: AC
  Administered 2015-07-28: 0.5 mg via INTRAVENOUS
  Filled 2015-07-28: qty 1

## 2015-07-28 MED ORDER — DIPHENHYDRAMINE HCL 12.5 MG/5ML PO ELIX
12.5000 mg | ORAL_SOLUTION | Freq: Four times a day (QID) | ORAL | Status: DC | PRN
  Filled 2015-07-28: qty 5

## 2015-07-28 MED ORDER — POTASSIUM CHLORIDE 10 MEQ/50ML IV SOLN: 10.0000 meq | Freq: Every day | INTRAVENOUS | Status: DC | PRN

## 2015-07-28 MED ORDER — ARTIFICIAL TEARS OP OINT
TOPICAL_OINTMENT | OPHTHALMIC | Status: DC | PRN
  Administered 2015-07-28: 1 via OPHTHALMIC

## 2015-07-28 MED ORDER — BUPIVACAINE HCL (PF) 0.5 % IJ SOLN
INTRAMUSCULAR | Status: AC
  Filled 2015-07-28: qty 10

## 2015-07-28 MED ORDER — KCL IN DEXTROSE-NACL 20-5-0.45 MEQ/L-%-% IV SOLN
INTRAVENOUS | Status: DC
  Administered 2015-07-28 – 2015-07-29 (×2): via INTRAVENOUS
  Filled 2015-07-28 (×5): qty 1000

## 2015-07-28 MED ORDER — LACTATED RINGERS IV SOLN
INTRAVENOUS | Status: DC | PRN
  Administered 2015-07-28 (×2): via INTRAVENOUS

## 2015-07-28 MED ORDER — ONDANSETRON HCL 4 MG/2ML IJ SOLN
4.0000 mg | Freq: Four times a day (QID) | INTRAMUSCULAR | Status: DC | PRN
Start: 2015-07-28 — End: 2015-07-30

## 2015-07-28 MED ORDER — ACETAMINOPHEN 160 MG/5ML PO SOLN
1000.0000 mg | Freq: Four times a day (QID) | ORAL | Status: DC
Start: 2015-07-28 — End: 2015-07-30
  Filled 2015-07-28 (×12): qty 40

## 2015-07-28 MED ORDER — ONDANSETRON HCL 4 MG/2ML IJ SOLN: 4.0000 mg | Freq: Four times a day (QID) | INTRAMUSCULAR | Status: DC | PRN

## 2015-07-28 MED ORDER — PROMETHAZINE HCL 25 MG/ML IJ SOLN: 6.2500 mg | INTRAMUSCULAR | Status: DC | PRN

## 2015-07-28 MED ORDER — BUPIVACAINE 0.5 % ON-Q PUMP SINGLE CATH 300 ML
300.0000 mL | INJECTION | Status: DC
  Filled 2015-07-28 (×2): qty 300

## 2015-07-28 MED ORDER — ACETAMINOPHEN 500 MG PO TABS
1000.0000 mg | ORAL_TABLET | Freq: Four times a day (QID) | ORAL | Status: DC
Start: 2015-07-28 — End: 2015-07-30
  Administered 2015-07-28 – 2015-07-30 (×6): 1000 mg via ORAL
  Filled 2015-07-28 (×13): qty 2

## 2015-07-28 MED ORDER — PROPOFOL 10 MG/ML IV BOLUS
INTRAVENOUS | Status: DC | PRN
  Administered 2015-07-28: 40 mg via INTRAVENOUS
  Administered 2015-07-28: 160 mg via INTRAVENOUS

## 2015-07-28 MED ORDER — GLYCOPYRROLATE 0.2 MG/ML IJ SOLN
INTRAMUSCULAR | Status: DC | PRN
  Administered 2015-07-28: .2 mg via INTRAVENOUS
  Administered 2015-07-28: .4 mg via INTRAVENOUS

## 2015-07-28 MED ORDER — NALOXONE HCL 0.4 MG/ML IJ SOLN
0.4000 mg | INTRAMUSCULAR | Status: DC | PRN
  Filled 2015-07-28: qty 1

## 2015-07-28 MED ORDER — BUPIVACAINE 0.5 % ON-Q PUMP SINGLE CATH 400 ML
400.0000 mL | INJECTION | Status: DC
  Filled 2015-07-28: qty 400

## 2015-07-28 MED ORDER — FENTANYL CITRATE (PF) 100 MCG/2ML IJ SOLN
INTRAMUSCULAR | Status: DC | PRN
  Administered 2015-07-28: 100 ug via INTRAVENOUS
  Administered 2015-07-28: 50 ug via INTRAVENOUS
  Administered 2015-07-28: 100 ug via INTRAVENOUS

## 2015-07-28 MED ORDER — PHENYLEPHRINE HCL 10 MG/ML IJ SOLN
INTRAMUSCULAR | Status: DC | PRN
  Administered 2015-07-28: 160 ug via INTRAVENOUS
  Administered 2015-07-28: 80 ug via INTRAVENOUS
  Administered 2015-07-28 (×3): 40 ug via INTRAVENOUS
  Administered 2015-07-28: 160 ug via INTRAVENOUS
  Administered 2015-07-28: 80 ug via INTRAVENOUS
  Administered 2015-07-28: 160 ug via INTRAVENOUS
  Administered 2015-07-28: 40 ug via INTRAVENOUS

## 2015-07-28 MED ORDER — PROPOFOL 10 MG/ML IV BOLUS
INTRAVENOUS | Status: AC
  Filled 2015-07-28: qty 20

## 2015-07-28 MED ORDER — HYDROMORPHONE HCL 1 MG/ML IJ SOLN
0.2500 mg | INTRAMUSCULAR | Status: DC | PRN
  Administered 2015-07-28 (×2): 0.5 mg via INTRAVENOUS

## 2015-07-28 MED ORDER — ROCURONIUM BROMIDE 100 MG/10ML IV SOLN
INTRAVENOUS | Status: DC | PRN
  Administered 2015-07-28: 10 mg via INTRAVENOUS
  Administered 2015-07-28: 20 mg via INTRAVENOUS
  Administered 2015-07-28: 40 mg via INTRAVENOUS
  Administered 2015-07-28 (×3): 10 mg via INTRAVENOUS

## 2015-07-28 MED ORDER — DIPHENHYDRAMINE HCL 50 MG/ML IJ SOLN
12.5000 mg | Freq: Four times a day (QID) | INTRAMUSCULAR | Status: DC | PRN
  Filled 2015-07-28: qty 0.25

## 2015-07-28 MED ORDER — TRAMADOL HCL 50 MG PO TABS: 50.0000 mg | ORAL_TABLET | Freq: Four times a day (QID) | ORAL | Status: DC | PRN

## 2015-07-28 MED ORDER — LIDOCAINE HCL (CARDIAC) 20 MG/ML IV SOLN
INTRAVENOUS | Status: DC | PRN
  Administered 2015-07-28 (×2): 50 mg via INTRAVENOUS

## 2015-07-28 MED ORDER — MEPERIDINE HCL 25 MG/ML IJ SOLN: 6.2500 mg | INTRAMUSCULAR | Status: DC | PRN

## 2015-07-28 MED ORDER — LORAZEPAM 0.5 MG PO TABS
0.5000 mg | ORAL_TABLET | Freq: Three times a day (TID) | ORAL | Status: DC | PRN
  Administered 2015-07-28: 0.5 mg via ORAL
  Filled 2015-07-28: qty 1

## 2015-07-28 MED ORDER — BUPIVACAINE HCL (PF) 0.5 % IJ SOLN
INTRAMUSCULAR | Status: DC | PRN
  Administered 2015-07-28: 10 mL

## 2015-07-28 MED ORDER — SODIUM CHLORIDE 0.9 % IJ SOLN: 9.0000 mL | INTRAMUSCULAR | Status: DC | PRN

## 2015-07-28 MED ORDER — ALPRAZOLAM 0.5 MG PO TABS
0.5000 mg | ORAL_TABLET | Freq: Every evening | ORAL | Status: DC | PRN
  Administered 2015-07-29: 0.5 mg via ORAL
  Filled 2015-07-28: qty 1

## 2015-07-28 MED ORDER — EPHEDRINE SULFATE 50 MG/ML IJ SOLN
INTRAMUSCULAR | Status: DC | PRN
  Administered 2015-07-28: 10 mg via INTRAVENOUS
  Administered 2015-07-28: 5 mg via INTRAVENOUS
  Administered 2015-07-28: 10 mg via INTRAVENOUS

## 2015-07-28 MED ORDER — FENTANYL CITRATE (PF) 250 MCG/5ML IJ SOLN
INTRAMUSCULAR | Status: AC
  Filled 2015-07-28: qty 5

## 2015-07-28 MED ORDER — ASPIRIN 81 MG PO CHEW
81.0000 mg | CHEWABLE_TABLET | Freq: Every day | ORAL | Status: DC
  Administered 2015-07-29: 81 mg via ORAL
  Filled 2015-07-28: qty 1

## 2015-07-28 MED ORDER — BUPIVACAINE ON-Q PAIN PUMP (FOR ORDER SET NO CHG)
INJECTION | Status: DC
Start: 2015-07-28 — End: 2015-07-30
  Filled 2015-07-28: qty 1

## 2015-07-28 MED ORDER — FENTANYL 10 MCG/ML IV SOLN
INTRAVENOUS | Status: AC
  Administered 2015-07-28: 270 ug via INTRAVENOUS
  Administered 2015-07-28: 105 ug via INTRAVENOUS
  Administered 2015-07-28 (×2): via INTRAVENOUS
  Administered 2015-07-29: 17.98 ug via INTRAVENOUS
  Administered 2015-07-29: 60 ug/h via INTRAVENOUS
  Administered 2015-07-29: 68.1 ug via INTRAVENOUS
  Administered 2015-07-29: 30 ug/h via INTRAVENOUS
  Filled 2015-07-28 (×2): qty 50

## 2015-07-28 MED ORDER — CITALOPRAM HYDROBROMIDE 20 MG PO TABS
20.0000 mg | ORAL_TABLET | Freq: Every day | ORAL | Status: DC
  Administered 2015-07-29: 20 mg via ORAL
  Filled 2015-07-28 (×3): qty 1

## 2015-07-28 MED ORDER — OXYCODONE HCL 5 MG PO TABS
5.0000 mg | ORAL_TABLET | ORAL | Status: DC | PRN
  Administered 2015-07-29: 10 mg via ORAL
  Filled 2015-07-28: qty 2

## 2015-07-28 MED ORDER — BISACODYL 5 MG PO TBEC
10.0000 mg | DELAYED_RELEASE_TABLET | Freq: Every day | ORAL | Status: DC
  Administered 2015-07-28 – 2015-07-29 (×2): 10 mg via ORAL
  Filled 2015-07-28 (×2): qty 2

## 2015-07-28 MED ORDER — DEXTROSE 5 % IV SOLN
1.5000 g | Freq: Two times a day (BID) | INTRAVENOUS | Status: AC
  Administered 2015-07-28 – 2015-07-29 (×2): 1.5 g via INTRAVENOUS
  Filled 2015-07-28 (×3): qty 1.5

## 2015-07-28 MED ORDER — ONDANSETRON HCL 4 MG/2ML IJ SOLN
INTRAMUSCULAR | Status: DC | PRN
  Administered 2015-07-28: 4 mg via INTRAVENOUS

## 2015-07-28 MED ORDER — 0.9 % SODIUM CHLORIDE (POUR BTL) OPTIME
TOPICAL | Status: DC | PRN
  Administered 2015-07-28: 2000 mL

## 2015-07-28 MED ORDER — SENNOSIDES-DOCUSATE SODIUM 8.6-50 MG PO TABS
1.0000 | ORAL_TABLET | Freq: Every day | ORAL | Status: DC
Start: 2015-07-28 — End: 2015-07-30
  Administered 2015-07-28: 1 via ORAL
  Filled 2015-07-28 (×3): qty 1

## 2015-07-28 MED ORDER — BUPIVACAINE 0.5 % ON-Q PUMP SINGLE CATH 400 ML
400.0000 mL | INJECTION | Status: DC
  Filled 2015-07-28 (×2): qty 400

## 2015-07-28 MED ORDER — MIDAZOLAM HCL 2 MG/2ML IJ SOLN
INTRAMUSCULAR | Status: AC
  Filled 2015-07-28: qty 4

## 2015-07-28 MED ORDER — LISINOPRIL 2.5 MG PO TABS
2.5000 mg | ORAL_TABLET | Freq: Every day | ORAL | Status: DC
  Administered 2015-07-29: 2.5 mg via ORAL
  Filled 2015-07-28 (×3): qty 1

## 2015-07-28 MED ORDER — LIDOCAINE HCL 4 % MT SOLN
OROMUCOSAL | Status: DC | PRN
Start: 2015-07-28 — End: 2015-07-28
  Administered 2015-07-28: 4 mL via TOPICAL

## 2015-07-28 MED ORDER — METOPROLOL TARTRATE 12.5 MG HALF TABLET
12.5000 mg | ORAL_TABLET | Freq: Two times a day (BID) | ORAL | Status: DC
  Administered 2015-07-28 – 2015-07-29 (×3): 12.5 mg via ORAL
  Filled 2015-07-28 (×7): qty 1

## 2015-07-28 MED ORDER — BUPIVACAINE 0.5 % ON-Q PUMP SINGLE CATH 400 ML
INJECTION | Status: DC | PRN
  Administered 2015-07-28: 400 mL

## 2015-07-28 MED ORDER — MIDAZOLAM HCL 5 MG/5ML IJ SOLN
INTRAMUSCULAR | Status: DC | PRN
  Administered 2015-07-28: 2 mg via INTRAVENOUS

## 2015-07-28 SURGICAL SUPPLY — 85 items
APPLIER CLIP ROT 10 11.4 M/L (STAPLE)
BENZOIN TINCTURE PRP APPL 2/3 (GAUZE/BANDAGES/DRESSINGS) ×4 IMPLANT
CANISTER SUCTION 2500CC (MISCELLANEOUS) ×8 IMPLANT
CATH KIT ON Q 5IN SLV (PAIN MANAGEMENT) IMPLANT
CATH KIT ON-Q SILVERSOAK 5IN (CATHETERS) ×4 IMPLANT
CATH THORACIC 28FR (CATHETERS) IMPLANT
CATH THORACIC 28FR RT ANG (CATHETERS) IMPLANT
CATH THORACIC 36FR (CATHETERS) IMPLANT
CATH THORACIC 36FR RT ANG (CATHETERS) IMPLANT
CLIP APPLIE ROT 10 11.4 M/L (STAPLE) IMPLANT
CLIP TI MEDIUM 6 (CLIP) IMPLANT
CLOSURE WOUND 1/2 X4 (GAUZE/BANDAGES/DRESSINGS) ×1
CONN Y 3/8X3/8X3/8  BEN (MISCELLANEOUS) ×2
CONN Y 3/8X3/8X3/8 BEN (MISCELLANEOUS) ×2 IMPLANT
CONT SPEC 4OZ CLIKSEAL STRL BL (MISCELLANEOUS) ×16 IMPLANT
COTTONBALL LRG STERILE PKG (GAUZE/BANDAGES/DRESSINGS) IMPLANT
COVER SURGICAL LIGHT HANDLE (MISCELLANEOUS) ×4 IMPLANT
COVER TABLE BACK 60X90 (DRAPES) ×4 IMPLANT
DERMABOND ADVANCED (GAUZE/BANDAGES/DRESSINGS) ×2
DERMABOND ADVANCED .7 DNX12 (GAUZE/BANDAGES/DRESSINGS) ×2 IMPLANT
DRAIN CHANNEL 28F RND 3/8 FF (WOUND CARE) ×8 IMPLANT
DRAIN CHANNEL 32F RND 10.7 FF (WOUND CARE) IMPLANT
DRAPE LAPAROSCOPIC ABDOMINAL (DRAPES) ×4 IMPLANT
DRAPE WARM FLUID 44X44 (DRAPE) ×4 IMPLANT
ELECT REM PT RETURN 9FT ADLT (ELECTROSURGICAL) ×4
ELECTRODE REM PT RTRN 9FT ADLT (ELECTROSURGICAL) ×2 IMPLANT
GAUZE SPONGE 4X4 12PLY STRL (GAUZE/BANDAGES/DRESSINGS) ×4 IMPLANT
GLOVE BIO SURGEON STRL SZ 6.5 (GLOVE) ×3 IMPLANT
GLOVE BIO SURGEONS STRL SZ 6.5 (GLOVE) ×1
GLOVE BIOGEL PI IND STRL 6.5 (GLOVE) ×8 IMPLANT
GLOVE BIOGEL PI INDICATOR 6.5 (GLOVE) ×8
GLOVE ECLIPSE 6.5 STRL STRAW (GLOVE) ×8 IMPLANT
GLOVE SURG SIGNA 7.5 PF LTX (GLOVE) ×12 IMPLANT
GLOVE SURG SS PI 7.0 STRL IVOR (GLOVE) ×4 IMPLANT
GOWN STRL REUS W/ TWL LRG LVL3 (GOWN DISPOSABLE) ×8 IMPLANT
GOWN STRL REUS W/ TWL XL LVL3 (GOWN DISPOSABLE) ×6 IMPLANT
GOWN STRL REUS W/TWL LRG LVL3 (GOWN DISPOSABLE) ×8
GOWN STRL REUS W/TWL XL LVL3 (GOWN DISPOSABLE) ×6
HEMOSTAT SURGICEL 2X14 (HEMOSTASIS) ×4 IMPLANT
KIT BASIN OR (CUSTOM PROCEDURE TRAY) ×4 IMPLANT
KIT CLEAN ENDO COMPLIANCE (KITS) ×8 IMPLANT
KIT ROOM TURNOVER OR (KITS) ×4 IMPLANT
KIT SUCTION CATH 14FR (SUCTIONS) ×4 IMPLANT
MARKER SKIN DUAL TIP RULER LAB (MISCELLANEOUS) ×4 IMPLANT
NEEDLE SONO TIP II EBUS (NEEDLE) ×4 IMPLANT
NS IRRIG 1000ML POUR BTL (IV SOLUTION) ×12 IMPLANT
OIL SILICONE PENTAX (PARTS (SERVICE/REPAIRS)) ×4 IMPLANT
PACK CHEST (CUSTOM PROCEDURE TRAY) ×4 IMPLANT
PAD ARMBOARD 7.5X6 YLW CONV (MISCELLANEOUS) ×16 IMPLANT
SOLUTION ANTI FOG 6CC (MISCELLANEOUS) ×4 IMPLANT
SPONGE GAUZE 4X4 12PLY STER LF (GAUZE/BANDAGES/DRESSINGS) ×4 IMPLANT
SPONGE INTESTINAL PEANUT (DISPOSABLE) ×4 IMPLANT
STRIP CLOSURE SKIN 1/2X4 (GAUZE/BANDAGES/DRESSINGS) ×3 IMPLANT
SUT PROLENE 4 0 RB 1 (SUTURE)
SUT PROLENE 4-0 RB1 .5 CRCL 36 (SUTURE) IMPLANT
SUT SILK  1 MH (SUTURE) ×4
SUT SILK 1 MH (SUTURE) ×4 IMPLANT
SUT SILK 2 0SH CR/8 30 (SUTURE) IMPLANT
SUT SILK 3 0 SH 30 (SUTURE) ×4 IMPLANT
SUT SILK 3 0SH CR/8 30 (SUTURE) IMPLANT
SUT VIC AB 1 CTX 36 (SUTURE) ×2
SUT VIC AB 1 CTX36XBRD ANBCTR (SUTURE) ×2 IMPLANT
SUT VIC AB 2-0 CTX 36 (SUTURE) ×4 IMPLANT
SUT VIC AB 2-0 UR6 27 (SUTURE) IMPLANT
SUT VIC AB 3-0 MH 27 (SUTURE) IMPLANT
SUT VIC AB 3-0 X1 27 (SUTURE) ×4 IMPLANT
SUT VICRYL 2 TP 1 (SUTURE) IMPLANT
SYR 20CC LL (SYRINGE) ×4 IMPLANT
SYR 20ML ECCENTRIC (SYRINGE) ×4 IMPLANT
SYR 5ML LL (SYRINGE) ×4 IMPLANT
SYR 5ML LUER SLIP (SYRINGE) ×4 IMPLANT
SYR CONTROL 10ML LL (SYRINGE) IMPLANT
SYSTEM SAHARA CHEST DRAIN ATS (WOUND CARE) ×4 IMPLANT
TAPE CLOTH 4X10 WHT NS (GAUZE/BANDAGES/DRESSINGS) ×4 IMPLANT
TAPE CLOTH SURG 4X10 WHT LF (GAUZE/BANDAGES/DRESSINGS) ×4 IMPLANT
TOWEL OR 17X24 6PK STRL BLUE (TOWEL DISPOSABLE) ×8 IMPLANT
TOWEL OR 17X26 10 PK STRL BLUE (TOWEL DISPOSABLE) ×8 IMPLANT
TRAP SPECIMEN MUCOUS 40CC (MISCELLANEOUS) ×4 IMPLANT
TRAY FOLEY CATH 16FRSI W/METER (SET/KITS/TRAYS/PACK) ×4 IMPLANT
TROCAR XCEL BLADELESS 5X75MML (TROCAR) ×4 IMPLANT
TROCAR XCEL NON-BLD 5MMX100MML (ENDOMECHANICALS) IMPLANT
TUBE CONNECTING 20'X1/4 (TUBING) ×1
TUBE CONNECTING 20X1/4 (TUBING) ×3 IMPLANT
TUNNELER SHEATH ON-Q 11GX8 DSP (PAIN MANAGEMENT) ×4 IMPLANT
WATER STERILE IRR 1000ML POUR (IV SOLUTION) ×4 IMPLANT

## 2015-07-28 NOTE — Brief Op Note (Addendum)
07/28/2015  11:00 AM  PATIENT:  Brian [name]  54 y.o. male  PRE-OPERATIVE DIAGNOSIS:  ADENOPATHY; history of nodular sclerosing Hodgkin's disease   POST-OPERATIVE DIAGNOSIS:  ADENOPATHY; history of nodular sclerosing Hodgkin's disease   PROCEDURE:   VIDEO BRONCHOSCOPY WITH ENDOBRONCHIAL ULTRASOUND RIGHT VIDEO ASSISTED THORACOSCOPY FOR LYMPH NODE BIOPSY  SURGEON:  Melrose Nakayama, MD  ASSISTANT: Suzzanne Cloud, PA-C  ANESTHESIA:   general  SPECIMEN:  Source of Specimen:  mediastinal lymph nodes  DISPOSITION OF SPECIMEN:  Pathology  DRAINS: 42 Blake drain   PATIENT CONDITION:  PACU - hemodynamically stable.  No endobronchial lesions seen EBUS specimens nondiagnostic Enlarged level 7 and 8 lymph nodes, mildly enlarged anterior mediastinal nodes

## 2015-07-28 NOTE — Progress Notes (Signed)
Report given to [facility] rn as caregriver

## 2015-07-28 NOTE — Progress Notes (Signed)
Wasted 6 ml of Fentanyl PCA in sink witness by Reden R.N.

## 2015-07-28 NOTE — Progress Notes (Signed)
Witnessed the wasted 6 ml of Fentanyl PCA by [facility] LPN.

## 2015-07-28 NOTE — Transfer of Care (Signed)
Immediate Anesthesia Transfer of Care Note  Patient: Brian [name]  Procedure(s) Performed: Procedure(s): VIDEO BRONCHOSCOPY WITH ENDOBRONCHIAL ULTRASOUND (N/A) LYMPH NODE BIOPSY (N/A) RIGHT VIDEO ASSISTED THORACOSCOPY (Right) NODE DISSECTION (Right)  Patient Location: PACU  Anesthesia Type:General  Level of Consciousness: awake, alert  and oriented  Airway & Oxygen Therapy: Patient Spontanous Breathing and Patient connected to nasal cannula oxygen  Post-op Assessment: Report given to RN, Post -op Vital signs reviewed and stable and Patient moving all extremities X 4  Post vital signs: Reviewed and stable  Last Vitals:  Filed Vitals:   07/28/15 0612  BP: 118/83  Pulse: 71  Temp: 36.7 C  Resp: 20    Complications: No apparent anesthesia complications

## 2015-07-28 NOTE — Interval H&P Note (Signed)
History and Physical Interval Note:  07/28/2015 7:53 AM  Brian [name]  has presented today for surgery, with the diagnosis of ADENOPATHY  The various methods of treatment have been discussed with the patient and family. After consideration of risks, benefits and other options for treatment, the patient has consented to  Procedure(s): VIDEO BRONCHOSCOPY WITH ENDOBRONCHIAL ULTRASOUND (N/A) LYMPH NODE BIOPSY (N/A) POSSIBLE VIDEO ASSISTED THORACOSCOPY (Right) as a surgical intervention .  The patient's history has been reviewed, patient examined, no change in status, stable for surgery.  I have reviewed the patient's chart and labs.  Questions were answered to the patient's satisfaction.     Melrose Nakayama

## 2015-07-28 NOTE — Anesthesia Procedure Notes (Addendum)
Procedure Name: Intubation Performed by: Mariea Clonts Pre-anesthesia Checklist: Patient identified, Timeout performed, Emergency Drugs available, Suction available and Patient being monitored Patient Re-evaluated:Patient Re-evaluated prior to inductionOxygen Delivery Method: Circle system utilized Preoxygenation: Pre-oxygenation with 100% oxygen Intubation Type: IV induction Ventilation: Mask ventilation without difficulty Laryngoscope Size: Miller and 2 Grade View: Grade II Tube type: Oral Tube size: 8.5 mm Number of attempts: 1 Airway Equipment and Method: LTA kit utilized Placement Confirmation: ETT inserted through vocal cords under direct vision,  breath sounds checked- equal and bilateral and positive ETCO2 Tube secured with: Tape Dental Injury: Teeth and Oropharynx as per pre-operative assessment    Procedure Name: Intubation Performed by: Mariea Clonts Laryngoscope Size: Mac and 4 Grade View: Grade II Endobronchial tube: EBT position confirmed by auscultation, Left, EBT position confirmed by fiberoptic bronchoscope and Double lumen EBT and 39 Fr Number of attempts: 2 Placement Confirmation: breath sounds checked- equal and bilateral,  ETT inserted through vocal cords under direct vision and positive ETCO2 Tube secured with: Tape Dental Injury: Teeth and Oropharynx as per pre-operative assessment

## 2015-07-28 NOTE — Anesthesia Postprocedure Evaluation (Signed)
Anesthesia Post Note  Patient: MAIKA KACZMAREK  Procedure(s) Performed: Procedure(s) (LRB): VIDEO BRONCHOSCOPY WITH ENDOBRONCHIAL ULTRASOUND (N/A) LYMPH NODE BIOPSY (N/A) RIGHT VIDEO ASSISTED THORACOSCOPY (Right) NODE DISSECTION (Right)  Anesthesia type: General  Patient location: PACU  Post pain: Pain level controlled  Post assessment: Post-op Vital signs reviewed  Last Vitals: BP 118/83 mmHg  Pulse 71  Temp(Src) 36.5 C  Resp 20  Wt 178 lb (80.74 kg)  SpO2 99%  Post vital signs: Reviewed  Level of consciousness: sedated  Complications: No apparent anesthesia complications

## 2015-07-28 NOTE — H&P (View-Only) (Signed)
PCP is DAUB, Lina Sayre, MD Referring Provider is [name], MD  Chief Complaint  Patient presents with  . Follow-up    for recurrent Hodgkin's...PET 07/11/15.Marland KitchenMarland KitchenPORT-A-CATH in place    HPI: Brian [name] is sent for consultation regarding a mediastinal adenopathy.  Brian [name] is a 54 year old man with a history of nodular sclerosing Hodgkin's disease. He has been treated with chemotherapy, salvage chemotherapy, total body radiation and autologous stem cell transplant, and most recently Adcetris. I did a left VATS and resected a pleural-based mass which turned out to be recurrent Hodgkin's disease in July 2015. He received 12 of 16 planned cycles of that Adcetris, but it was stopped after he had a TIA, which was characterized by a few minutes of slurred speech. He has not had any other symptoms since that isolated event.  He recently saw Dr. Marin Olp and had a PET/CT done for follow-up. It showed hypermetabolic lymph nodes in the left prevascular area and along the bronchus intermedius on the right side.  He says he has been feeling well. He is not having any fevers, chills. He did have one night sweat 2 weeks ago that he thinks is due to the temperature in the room. That has not recurred. His appetite is good. His weight is stable. He denies headaches or visual changes. He would not know there was an issue had he not been scanned.     Past Medical History  Diagnosis Date  . Wears contact lenses   . H/O autologous stem cell transplant     may 2014  at [facility]  . History of peptic ulcer   . Mass of right inguinal region   . History of Bell's palsy     2009  RIGHT SIDE--  HAS 80% FUNCTION / PT STATES A LITTLE ASYMETRICAL AND EFFECTS MOUTH  . Hodgkin's disease, nodular sclerosis, of inguinal region/lower limb ONOLOGIST--  [facility] AND A [facility]      SALVAGE CHEMO 2013/  AUTOLOGUS STEM CELL TRANSPLANT MAY 2014 AT [facility]  . Dysrhythmia     past hx pvc  . Anxiety   . PVC (premature ventricular  contraction)     "benign"    Past Surgical History  Procedure Laterality Date  . Axillary lymph node biopsy Left 02/02/2013    Procedure: NEEDLE LOCALIZED AXILLARY LYMPH NODE BIOPSY;  Surgeon: Haywood Lasso, MD;  Location: [place];  Service: General;  Laterality: Left;  . Cyst removal neck Left 1980  . Removal right inguinal lymph nodes  08-16-2011  . Left inguinal lymph node bx  09-09-2012  . Transthoracic echocardiogram  03-18-2013    MILD LVH/  EF 55-60%  . Scrotal exploration Right 01/04/2014    Procedure: SCROTUM EXPLORATION   INGUINAL , EXCISION OF CYSTIC MASS OF RIGHT SPERMATIC CORD, WITH FROZEN SECTION;  Surgeon: Franchot Gallo, MD;  Location: [facility];  Service: Urology;  Laterality: Right;  . Pleura biopsy Left 05/31/2014  . Video assisted thoracoscopy (vats)/wedge resection  05/31/2014  . Bone marrow biopsy  08/2012  . Video assisted thoracoscopy Left 05/31/2014    Procedure: LEFT VIDEO ASSISTED THORACOSCOPY, PLEURAL BIOPSY;  Surgeon: Melrose Nakayama, MD;  Location: [place];  Service: Thoracic;  Laterality: Left;    Family History  Problem Relation Age of Onset  . Cancer Mother 57    ovarian  . Heart disease Father   . Stroke Father   . Cancer Sister     ovarian  .  Cancer Brother     testicular    Social History Social History  Substance Use Topics  . Smoking status: Never Smoker   . Smokeless tobacco: Never Used     Comment: never smoked tobacco  . Alcohol Use: 2.4 oz/week    4 Cans of beer per week    Current Outpatient Prescriptions  Medication Sig Dispense Refill  . acetaminophen (TYLENOL) 325 MG tablet Take 325 mg by mouth every 6 (six) hours as needed for mild pain.    Marland Kitchen ALPRAZolam (XANAX) 0.5 MG tablet TAKE 1 TABLET BY MOUTH AT BEDTIME AS NEEDED FOR SLEEP 30 tablet 2  . aspirin 81 MG chewable tablet Chew 1 tablet (81 mg total) by mouth daily. 30 tablet 0  . Cholecalciferol (VITAMIN D-3) 1000 UNITS CAPS Take by  mouth 2 (two) times daily.    . citalopram (CELEXA) 20 MG tablet Take 1 tablet (20 mg total) by mouth daily. 30 tablet 6  . dexamethasone (DECADRON) 4 MG tablet Take 2 tablets (8 mg total) by mouth 2 (two) times daily with a meal. Start the day after chemotherapy for 2 days. Take with food. 30 tablet 1  . ibuprofen (ADVIL,MOTRIN) 200 MG tablet Take 200-400 mg by mouth every 6 (six) hours as needed for mild pain.     Marland Kitchen lisinopril (PRINIVIL,ZESTRIL) 2.5 MG tablet Take 1 tablet (2.5 mg total) by mouth daily. 30 tablet 6  . LORazepam (ATIVAN) 1 MG tablet TAKE 1/2 A TABLET BY MOUTH TWICE DAILY DURING THE DAY FOR ANXIETY, AND 1 TABLET AT BEDTIME IF NEEDED 20 tablet 3  . metoprolol tartrate (LOPRESSOR) 25 MG tablet Take 0.5 tablets (12.5 mg total) by mouth 2 (two) times daily. 60 tablet 6  . Multiple Vitamin (MULTIVITAMIN) tablet Take 1 tablet by mouth daily.    Marland Kitchen lidocaine-prilocaine (EMLA) cream Apply 1 application topically as needed. Apply nickel sized amount to portacath site at least 1 hour prior to chemotherapy. (Patient not taking: Reported on 07/14/2015) 30 g 1   No current facility-administered medications for this visit.   Facility-Administered Medications Ordered in Other Visits  Medication Dose Route Frequency Provider Last Rate Last Dose  . fludeoxyglucose F - 18 (FDG) injection 8.74 milli Curie  8.74 milli Curie Intravenous Once PRN Medication Radiologist, MD   8.74 milli Curie at 07/11/15 [phone]    No Known Allergies  Review of Systems  Constitutional: Negative for fever, chills, diaphoresis, activity change, appetite change, fatigue and unexpected weight change.  Respiratory: Negative for cough, shortness of breath, wheezing and stridor.   Cardiovascular: Negative for chest pain and leg swelling.  Gastrointestinal: Negative for abdominal pain.  Genitourinary: Positive for urgency, frequency and difficulty urinating.  Musculoskeletal:       Paresthesias left costal margin   Neurological: Positive for dizziness, weakness and headaches.  Hematological: Positive for adenopathy. Bruises/bleeds easily.  All other systems reviewed and are negative.   BP 124/78 mmHg  Pulse 71  Resp 16  Ht _0  (1.778 m)  Wt 175 lb (79.379 kg)  BMI 25.11 kg/m2  SpO2 99% Physical Exam  Constitutional: He appears well-developed and well-nourished. No distress.  HENT:  Head: Normocephalic and atraumatic.  Eyes: Conjunctivae and EOM are normal. No scleral icterus.  Neck: Normal range of motion. No thyromegaly present.  Cardiovascular: Normal rate, regular rhythm, normal heart sounds and intact distal pulses.  Exam reveals no gallop and no friction rub.   No murmur heard. Pulmonary/Chest: Effort normal and breath sounds normal.  He has no wheezes.  Well healed scars left chest  Abdominal: Soft. Bowel sounds are normal. He exhibits no distension. There is no tenderness.  Musculoskeletal: Normal range of motion. He exhibits no edema.  Lymphadenopathy:    He has no cervical adenopathy.  Neurological: No cranial nerve deficit. He exhibits normal muscle tone.  Motor 5/5 bilaterally  Skin: Skin is warm.  Psychiatric: He has a normal mood and affect.  Vitals reviewed.    Diagnostic Tests: NUCLEAR MEDICINE PET SKULL BASE TO THIGH  TECHNIQUE: 8.7 mCi F-18 FDG was injected intravenously. Full-ring PET imaging was performed from the skull base to thigh after the radiotracer. CT data was obtained and used for attenuation correction and anatomic localization.  FASTING BLOOD GLUCOSE: Value: 88 mg/dl  COMPARISON: Most recent PET-CT from 04/12/2015.  FINDINGS: NECK  No hypermetabolic lymph nodes in the neck. Stable non FDG avid mucous retention cysts in the lower right maxillary sinus.  CHEST  There is new hypermetabolism within an enlarging 0.8 cm short axis prevascular left mediastinal node (series 4/image 63) with max SUV 3.6, previously 0.4 cm. There is new  hypermetabolism within an enlarging 0.8 cm short axis subcarinal node (4/78) with max SUV 4.8, previously 0.4 cm. There are no additional hypermetabolic mediastinal nodes. Non hypermetabolic top-normal size subcentimeter prevascular nodes are not appreciably changed in size, including a 0.8 cm left prevascular node (4/67) and a 0.7 cm right prevascular node (4/69).  No hypermetabolic axillary or hilar nodes. A 0.8 cm left upper lobe subsolid pulmonary nodule (series 6/ image 41) is not appreciably changed since 12/26/2011 chest CT, in keeping with a benign etiology. No new significant pulmonary nodules on the CT scan. No acute consolidative airspace disease. Right internal jugular MediPort terminates at the cavoatrial junction. There are stable surgical clips in the left posterior pleural space, with no pleural effusions.  ABDOMEN/PELVIS  No abnormal hypermetabolic activity within the liver, pancreas, adrenal glands, or spleen. No hypermetabolic lymph nodes in the abdomen or pelvis. Spleen is normal size, with no splenic hypermetabolism. Stable simple 1.3 cm renal cyst in lateral upper left kidney. Stable mild prostatomegaly with nonspecific coarse internal prostatic calcifications.  SKELETON  No focal hypermetabolic activity to suggest skeletal metastasis.  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   Electronically Signed  By: Ilona Sorrel M.D.  On: 07/11/2015 11:42        Impression: Brian [name] is a 54 year old man with a history of nodular sclerosing Hodgkin's disease with multiple recurrences. He most recently was being treated with Adcetris, but that had to be discontinued after he had an episode of slurred speech. A recent PET/CT shows hypermetabolic left prevascular and right subcarinal adenopathy. The concern is that this could represent recurrent disease. If it is recurrent disease he may be a  candidate for immune therapy.  Unfortunately neither of these nodes is particularly easy to access. The node on the left side is under the manubrium. The node on the right side is accessible with endobronchial ultrasound, but unfortunately that may not yield a definitive diagnosis. It is too far down along the right bronchus to reach with mediastinoscopy. It is approachable with a right thoracoscopy. We did discuss the option of waiting with repeat radiographic follow-up as it is possible these are just reactive nodes and also to see if another node in it more accessible location became evident. Given his history of multiple recurrences he would like to proceed with biopsy now.  I  recommended that we try endobronchial ultrasound and node aspiration and possible right VATS for node biopsy. I discussed the indications, risks, benefits, and alternatives. He understands the general nature of the procedure an intraoperative decision making. He understands the risk include, but are not limited to MI, DVT, PE, bleeding, possible need for transfusion, infection, as well as the possibility of other unforeseeable complications.  Plan:  Endobronchial ultrasound, possible right VATS for lymph node biopsy on Thursday, 07/28/2015.  Melrose Nakayama, MD [facility] [phone]

## 2015-07-29 ENCOUNTER — Encounter (HOSPITAL_COMMUNITY): Payer: Self-pay | Admitting: Thoracic Surgery (Cardiothoracic Vascular Surgery)

## 2015-07-29 ENCOUNTER — Inpatient Hospital Stay (HOSPITAL_COMMUNITY): Payer: Commercial Managed Care - PPO

## 2015-07-29 DIAGNOSIS — C8190 Hodgkin lymphoma, unspecified, unspecified site: Secondary | ICD-10-CM

## 2015-07-29 LAB — BASIC METABOLIC PANEL
ANION GAP: 7 (ref 5–15)
BUN: 10 mg/dL (ref 6–20)
CHLORIDE: 100 mmol/L — AB (ref 101–111)
CO2: 27 mmol/L (ref 22–32)
Calcium: 8.5 mg/dL — ABNORMAL LOW (ref 8.9–10.3)
Creatinine, Ser: 1.05 mg/dL (ref 0.61–1.24)
GFR calc non Af Amer: >60 mL/min (ref >60)
Glucose, Bld: 144 mg/dL — ABNORMAL HIGH (ref 65–99)
POTASSIUM: 4 mmol/L (ref 3.5–5.1)
Sodium: 134 mmol/L — ABNORMAL LOW (ref 135–145)

## 2015-07-29 LAB — BLOOD GAS, ARTERIAL
Acid-Base Excess: 2.1 mmol/L — ABNORMAL HIGH (ref 0.0–2.0)
Bicarbonate: 26.2 mEq/L — ABNORMAL HIGH (ref 20.0–24.0)
Drawn by: 252031
O2 Content: 2 L/min
O2 Saturation: 95.3 %
PCO2 ART: 41.3 mmHg (ref 35.0–45.0)
PH ART: 7.419 (ref 7.350–7.450)
PO2 ART: 73.9 mmHg — AB (ref 80.0–100.0)
Patient temperature: 98.6
TCO2: 27.5 mmol/L (ref 0–100)

## 2015-07-29 LAB — CBC
HEMATOCRIT: 35.2 % — AB (ref 39.0–52.0)
HEMOGLOBIN: 11.6 g/dL — AB (ref 13.0–17.0)
MCH: 32.5 pg (ref 26.0–34.0)
MCHC: 33 g/dL (ref 30.0–36.0)
MCV: 98.6 fL (ref 78.0–100.0)
Platelets: 173 10*3/uL (ref 150–400)
RBC: 3.57 MIL/uL — ABNORMAL LOW (ref 4.22–5.81)
RDW: 13.1 % (ref 11.5–15.5)
WBC: 7.9 10*3/uL (ref 4.0–10.5)

## 2015-07-29 MED ORDER — KETOROLAC TROMETHAMINE 30 MG/ML IJ SOLN
30.0000 mg | Freq: Four times a day (QID) | INTRAMUSCULAR | Status: DC
  Administered 2015-07-29 – 2015-07-30 (×3): 30 mg via INTRAVENOUS
  Filled 2015-07-29 (×4): qty 1

## 2015-07-29 MED ORDER — ENOXAPARIN SODIUM 40 MG/0.4ML ~~LOC~~ SOLN
40.0000 mg | Freq: Every day | SUBCUTANEOUS | Status: DC
  Administered 2015-07-29: 40 mg via SUBCUTANEOUS
  Filled 2015-07-29 (×2): qty 0.4

## 2015-07-29 NOTE — Discharge Summary (Signed)
[address]       [place] [address]             [phone]              Discharge Summary  Name: Brian [name] DOB: 31-Dec-1960 54 y.o. MRN: [id number]   Admission Date: 07/28/2015 Discharge Date:     Admitting Diagnosis: Mediastinal lymphadenopathy Nodular sclerosing Hodgkin's disease   Discharge Diagnosis:  Mediastinal lymphadenopathy Nodular sclerosing Hodgkin's disease  Past Medical History  Diagnosis Date  . Wears contact lenses   . H/O autologous stem cell transplant     may 2014  at [facility]  . History of peptic ulcer   . Mass of right inguinal region   . History of Bell's palsy     2009  RIGHT SIDE--  HAS 80% FUNCTION / PT STATES A LITTLE ASYMETRICAL AND EFFECTS MOUTH  . Hodgkin's disease, nodular sclerosis, of inguinal region/lower limb ONOLOGIST--  [facility] AND A [facility]      SALVAGE CHEMO 2013/  AUTOLOGUS STEM CELL TRANSPLANT MAY 2014 AT [facility]  . Dysrhythmia     past hx pvc  . Anxiety   . PVC (premature ventricular contraction)     "benign"  . TIA (transient ischemic attack) 02/2015    "probable TIA"     Procedures: VIDEO BRONCHOSCOPY WITH ENDOBRONCHIAL ULTRASOUND RIGHT VIDEO ASSISTED THORACOSCOPY FOR LYMPH NODE BIOPSY - 07/28/2015    HPI:  The patient is a 54 y.o. male with a history of nodular sclerosing Hodgkin's disease. He has been treated with chemotherapy, salvage chemotherapy, total body radiation and autologous stem cell transplant, and most recently Adcetris. Dr Roxan Hockey performed a left VATS and resected a pleural-based mass which turned out to be recurrent Hodgkin's disease in July 2015. He received 12 of 16 planned cycles of Adcetris, but it was stopped after he had a TIA, which was characterized by a few minutes of slurred speech. He has not had any other symptoms since that isolated event.  He recently saw Dr. Marin Olp and had a PET/CT done for follow-up. It showed hypermetabolic lymph nodes in the left prevascular  area and along the bronchus intermedius on the right side.  He says he has been feeling well. He is not having any fevers, chills. He did have one night sweat 2 weeks ago that he thinks is due to the temperature in the room. That has not recurred. His appetite is good. His weight is stable. He denies headaches or visual changes. He would not have known there was an issue had he not been scanned.  He was referred to Dr. Roxan Hockey for consideration of lymph node biopsy. It was felt that endobronchial ultrasound should be attempted, but because of the location of the nodes, it was also felt that a right VATS approach might be necessary. All risks, benefits and alternatives of surgery were explained in detail, and the patient agreed to proceed.    Hospital Course:  The patient was admitted to [facility] on 07/28/2015. The patient was taken to the operating room and underwent the above procedure.    The postoperative course has generally been uneventful.  His chest tube showed no air leak and chest x-rays remained stable.  The chest tube was placed to water seal and was ultimately discontinued on 07/29/2015.  He has remained stable otherwise. Incisions are healing well. The patient is ambulating in the halls without difficulty and tolerating a regular diet. Pathology remains  pending.  The patient is progressing well overall and is medically stable for discharge home.     Recent vital signs:  Filed Vitals:   07/29/15 0755  BP:   Pulse:   Temp:   Resp: 18    Recent laboratory studies:  CBC: Recent Labs  07/26/15 1032 07/29/15 0403  WBC 4.8 7.9  HGB 13.4 11.6*  HCT 38.3* 35.2*  PLT 193 173   BMET:  Recent Labs  07/26/15 1032 07/29/15 0403  NA 138 134*  K 4.3 4.0  CL 106 100*  CO2 25 27  GLUCOSE 96 144*  BUN 26* 10  CREATININE 0.97 1.05  CALCIUM 9.6 8.5*    PT/INR:  Recent Labs  07/26/15 1032  LABPROT 15.2  INR 1.18     Medication List    STOP taking these medications         ibuprofen 200 MG tablet  Commonly known as:  ADVIL,MOTRIN     LORazepam 1 MG tablet  Commonly known as:  ATIVAN      TAKE these medications        acetaminophen 325 MG tablet  Commonly known as:  TYLENOL  Take 325 mg by mouth every 6 (six) hours as needed for mild pain.     ALPRAZolam 0.5 MG tablet  Commonly known as:  XANAX  TAKE 1 TABLET BY MOUTH AT BEDTIME AS NEEDED FOR SLEEP     aspirin 81 MG chewable tablet  Chew 1 tablet (81 mg total) by mouth daily.     citalopram 20 MG tablet  Commonly known as:  CELEXA  Take 1 tablet (20 mg total) by mouth daily.     lidocaine-prilocaine cream  Commonly known as:  EMLA  Apply 1 application topically as needed. Apply nickel sized amount to portacath site at least 1 hour prior to chemotherapy.     lisinopril 2.5 MG tablet  Commonly known as:  PRINIVIL,ZESTRIL  Take 1 tablet (2.5 mg total) by mouth daily.     metoprolol tartrate 25 MG tablet  Commonly known as:  LOPRESSOR  Take 0.5 tablets (12.5 mg total) by mouth 2 (two) times daily.     multivitamin tablet  Take 1 tablet by mouth daily.     traMADol 50 MG tablet  Commonly known as:  ULTRAM  Take 1-2 tablets (50-100 mg total) by mouth every 6 (six) hours as needed (mild pain).     Vitamin D-3 1000 UNITS Caps  Take 1 capsule by mouth daily.        Discharge Instructions:  The patient is to refrain from driving, heavy lifting or strenuous activity.  May shower daily and clean incisions with soap and water.  May resume regular diet.   Follow Up: Follow-up Information    Follow up with Melrose Nakayama, MD On 08/23/2015.   Specialty:  Cardiothoracic Surgery   Why:  Have a chest x-ray at [facility] at 10:45, then see MD at 11:45   Contact information:   [address] [place] [address] [phone]       Follow up with [facility] RN On 08/04/2015.   Why:  For suture removal at 10:00           [place] 07/29/2015, 9:20  AM

## 2015-07-29 NOTE — Progress Notes (Signed)
Pt ambulated in hall with RN SBA, walked >593ft and tolerated well.

## 2015-07-29 NOTE — Progress Notes (Signed)
Wasted 89ml of Fentanyl PCA in sink wintness by Domingo Dimes RN

## 2015-07-29 NOTE — Op Note (Signed)
NAMEKANAV, KAZMIERCZAK NO.:  [id number]  MEDICAL RECORD NO.:  [id number]  LOCATION:  2W07C                        FACILITY:  [facility]  PHYSICIAN:  Revonda Standard. Roxan Hockey, M.D.DATE OF BIRTH:  18-Apr-1961  DATE OF PROCEDURE:  07/28/2015 DATE OF DISCHARGE:                              OPERATIVE REPORT   PREOPERATIVE DIAGNOSIS:  Mediastinal adenopathy.  POSTOPERATIVE DIAGNOSIS:  Mediastinal adenopathy.  PROCEDURES:   Bronchoscopy Endobronchial ultrasound Right video- assisted thoracoscopy for lymph node biopsy On-Q local anesthetic catheter placement.  SURGEON:  Revonda Standard. Roxan Hockey, M.D.  ASSISTANT:  Suzzanne Cloud, P.A.  ANESTHESIA:  General.  FINDINGS:  Bronchoscopy; normal endobronchial anatomy and no endobronchial lesions.   Endobronchial ultrasound; enlarged level 7 node, aspirations were nondiagnostic.   Right thoracoscopy; enlarged level 7 and level 8 lymph nodes in close proximity, mildly enlarged anterior mediastinal node.  CLINICAL NOTE:  Mr. Hollenbeck is a 54 year old gentleman with a history of nodular-sclerosing Hodgkin's lymphoma.  He has been through multiple treatment regimens.  He recently had a PET-CT done, which showed hypermetabolic lymph nodes in the left prevascular area and along the bronchus intermedius in the right chest.  After discussion with Dr. Marin Olp, he wished to have the nodes biopsied for diagnostic purposes. It was felt that the nodes were not within reach of mediastinoscopy.  He was advised to undergo bronchoscopy and endobronchial ultrasound, and then possible right thoracoscopy to biopsy the nodes if necessary.  The indications, risks, benefits, alternatives, and intraoperative decision making were discussed in detail with the patient.  He accepted the risks and agreed to proceed.  OPERATIVE NOTE:  Mr. Malveaux was brought to the operating room on July 28, 2015.  He was given intravenous antibiotics.  He was anesthetized  and intubated.  A Foley catheter was placed.  Sequential compressive devices were placed on the calves for DVT prophylaxis.  Flexible fiberoptic bronchoscopy was performed via the endotracheal tube.  It revealed normal endobronchial anatomy and no endobronchial lesions.  The endobronchial ultrasound probe then was advanced.  The deep subcarinal nodes on the right side were identified.  Multiple aspirations were performed and these were sent for Quick-Prep, but returned nondiagnostic.  The patient was reintubated with a double-lumen endotracheal tube.  He was placed in a left lateral decubitus position and the right chest was prepped and draped in usual sterile fashion.  Single lung ventilation of the left lung was initiated and was tolerated well throughout the procedure.  An incision was made in the posterior axillary line in approximately the seventh intercostal space.  A 5-mm port was inserted and the thoracoscope was advanced into the chest.  There was no pleural effusion.  The lung was normal in appearance.  A small working incision 4 cm in length was made in the fourth interspace anterolaterally.  No rib spreading was performed during the procedure.  The lower lobe was grasped.  The inferior ligament was divided.  There was no nodal tissue found in the inferior ligament.  The pleura then was incised along the pleural reflection posteriorly at the hilum.  There was an enlarged node adjacent to the bronchus intermedius at the takeoff of the  right upper lobe bronchus.  This node was carefully dissected out and removed.  Exploration further up the right mainstem bronchus revealed no additional nodal tissue, but directly adjacent to the area where the subcarinal node was found, there was a paraesophageal level 8 node identified.  This node also was grossly enlarged, approximately 2 cm in size.  It was dissected out, but dissection was difficult near the esophagus.  A portion of the node  was removed leaving a small rim of the node attached to the esophagus.  The remainder of the chest then was inspected.  There was a slightly enlarged node in the anterior mediastinum approximately 2 cm anterior to the phrenic nerve. This node was removed as well.  All the nodes were sent for permanent pathology.  An inspection was made for hemostasis.  The chest was irrigated with saline.  An On-Q local anesthetic catheter was placed through a separate stab incision posteriorly and tunneled into a subpleural location.  It was primed with 5 mL of 0.5% Marcaine.  A 28- French chest tube was placed through the original port incision and secured with a #1 silk suture.  The right lung was reinflated and there was good reinflation of all three lobes.  The utility incision then was closed with a running #1 Vicryl fascial suture, a 2-0 Vicryl subcutaneous suture and a 3-0 Vicryl subcuticular suture.  All sponge, needle, and instrument counts were correct at the end of the procedure.  The patient was taken from the operating room to the postanesthetic care unit, extubated and in good condition.     Revonda Standard Roxan Hockey, M.D.     SCH/MEDQ  D:  07/28/2015  T:  07/29/2015  Job:  [id number]

## 2015-07-29 NOTE — Progress Notes (Signed)
Utilization review completed. Kavin Weckwerth, RN, BSN. 

## 2015-07-29 NOTE — Consult Note (Signed)
Referral MD  Reason for Referral: Recurrent Hodgkin's disease   No chief complaint on file. : My Hodgkin's disease might be back.  HPI: Brian [name] is a 54 year old white male. He has a history of recurrent Hodgkin's lymphoma. His history dates back to 2013. He was treated initially with ABVD. Unfortunately, he had a recurrence fairly quickly after this. This was in I think March 2014.  He then underwent salvage chemotherapy. He got back into remission. He then underwent stem cell transplant. He had this at [facility]. This was in May 2014.  He subsequently had another recurrence. He underwent surgery by Brian [name] to document this. He was treated with Adcetris. He had about 12 cycles. He then had a TIA. It's not clear as whether or not the Adcetris was a factor.  He had been in remission. Then on a routine PET scan, he has some positive mediastinal lymph nodes. These were less than a centimeter in size.  Because of his past history, we had to worry about recurrence. As such, he was seen by Brian [name] and underwent a right VATS procedure. Some lymph nodes were removed. Prior to this, he had a bronchial endoscopic ultrasound. However, no lymph nodes could be biopsied that were positive.  The results from the lymph node resection are pending.  He looks pretty good. He has a chest tube in his right side. He is a little bit sore. He's having some back discomfort.  We are seeing him to follow along while he is in the hospital.   Past Medical History  Diagnosis Date  . Wears contact lenses   . H/O autologous stem cell transplant     may 2014  at [facility]  . History of peptic ulcer   . Mass of right inguinal region   . History of Bell's palsy     2009  RIGHT SIDE--  HAS 80% FUNCTION / PT STATES A LITTLE ASYMETRICAL AND EFFECTS MOUTH  . Hodgkin's disease, nodular sclerosis, of inguinal region/lower limb ONOLOGIST--  [facility] AND [facility]      SALVAGE CHEMO 2013/  AUTOLOGUS STEM CELL TRANSPLANT  MAY 2014 AT [facility]  . Dysrhythmia     past hx pvc  . Anxiety   . PVC (premature ventricular contraction)     "benign"  . TIA (transient ischemic attack) 02/2015    "probable TIA"  :  Past Surgical History  Procedure Laterality Date  . Axillary lymph node biopsy Left 02/02/2013    Procedure: NEEDLE LOCALIZED AXILLARY LYMPH NODE BIOPSY;  Surgeon: Haywood Lasso, MD;  Location: [place];  Service: General;  Laterality: Left;  . Cyst removal neck Left 1980  . Removal right inguinal lymph nodes  08-16-2011  . Left inguinal lymph node bx  09-09-2012  . Transthoracic echocardiogram  03-18-2013    MILD LVH/  EF 55-60%  . Scrotal exploration Right 01/04/2014    Procedure: SCROTUM EXPLORATION   INGUINAL , EXCISION OF CYSTIC MASS OF RIGHT SPERMATIC CORD, WITH FROZEN SECTION;  Surgeon: Franchot Gallo, MD;  Location: [facility];  Service: Urology;  Laterality: Right;  . Pleura biopsy Left 05/31/2014  . Video assisted thoracoscopy (vats)/wedge resection  05/31/2014  . Bone marrow biopsy  08/2012  . Video assisted thoracoscopy Left 05/31/2014    Procedure: LEFT VIDEO ASSISTED THORACOSCOPY, PLEURAL BIOPSY;  Surgeon: Melrose Nakayama, MD;  Location: [facility];  Service: Thoracic;  Laterality: Left;  . Video assisted thoracoscopy (vats)/ lymph node sampling  Right 07/28/2015  . Video bronchoscopy with endobronchial ultrasound  07/28/2015  :   Current facility-administered medications:  .  acetaminophen (TYLENOL) tablet 1,000 mg, 1,000 mg, Oral, 4 times per day, 1,000 mg at 07/29/15 0540 **OR** acetaminophen (TYLENOL) solution 1,000 mg, 1,000 mg, Oral, 4 times per day, Brian Breeze, Brian [name] .  ALPRAZolam (XANAX) tablet 0.5 mg, 0.5 mg, Oral, QHS PRN, Brian Breeze, Brian [name] .  aspirin chewable tablet 81 mg, 81 mg, Oral, Daily, Brian L Collins, Brian [name] .  bisacodyl (DULCOLAX) EC tablet 10 mg, 10 mg, Oral, Daily, Brian Breeze, Brian [name], 10 mg at 07/28/15 1713 .  bupivacaine ON-Q pain pump,  , Other, Continuous, Brian L Collins, Brian [name] .  cefUROXime (ZINACEF) 1.5 g in dextrose 5 % 50 mL IVPB, 1.5 g, Intravenous, Q12H, Brian L Collins, Brian [name], 1.5 g at 07/28/15 2025 .  citalopram (CELEXA) tablet 20 mg, 20 mg, Oral, Daily, Brian L Collins, Brian [name] .  dextrose 5 % and 0.45 % NaCl with KCl 20 mEq/L infusion, , Intravenous, Continuous, Brian Breeze, Brian [name], Last Rate: 100 mL/hr at 07/29/15 0414 .  diphenhydrAMINE (BENADRYL) injection 12.5 mg, 12.5 mg, Intravenous, Q6H PRN **OR** diphenhydrAMINE (BENADRYL) 12.5 MG/5ML elixir 12.5 mg, 12.5 mg, Oral, Q6H PRN, Brian Breeze, Brian [name] .  fentaNYL 10 mcg/mL PCA injection, , Intravenous, 6 times per day, Brian Breeze, Brian [name] .  lisinopril (PRINIVIL,ZESTRIL) tablet 2.5 mg, 2.5 mg, Oral, Daily, Brian Breeze, Brian [name] .  LORazepam (ATIVAN) tablet 0.5 mg, 0.5 mg, Oral, TID PRN, Brian Breeze, Brian [name], 0.5 mg at 07/28/15 2239 .  metoprolol tartrate (LOPRESSOR) tablet 12.5 mg, 12.5 mg, Oral, BID, Brian Breeze, Brian [name], 12.5 mg at 07/28/15 2159 .  naloxone [facility]) injection 0.4 mg, 0.4 mg, Intravenous, PRN **AND** sodium chloride 0.9 % injection 9 mL, 9 mL, Intravenous, PRN, Brian Breeze, Brian [name] .  ondansetron (ZOFRAN) injection 4 mg, 4 mg, Intravenous, Q6H PRN, Brian L Collins, Brian [name] .  ondansetron (ZOFRAN) injection 4 mg, 4 mg, Intravenous, Q6H PRN, Brian Breeze, Brian [name] .  oxyCODONE (Oxy IR/ROXICODONE) immediate release tablet 5-10 mg, 5-10 mg, Oral, Q4H PRN, Brian L Collins, Brian [name] .  potassium chloride 10 mEq in 50 mL *CENTRAL LINE* IVPB, 10 mEq, Intravenous, Daily PRN, Brian Breeze, Brian [name] .  senna-docusate (Senokot-S) tablet 1 tablet, 1 tablet, Oral, QHS, Brian Breeze, Brian [name], 1 tablet at 07/28/15 2159 .  traMADol (ULTRAM) tablet 50-100 mg, 50-100 mg, Oral, Q6H PRN, Brian Breeze, Brian [name]:  . acetaminophen  1,000 mg Oral 4 times per day   Or  . acetaminophen (TYLENOL) oral liquid 160 mg/5 mL  1,000 mg Oral 4 times per day  . aspirin  81 mg Oral Daily  . bisacodyl  10  mg Oral Daily  . cefUROXime (ZINACEF)  IV  1.5 g Intravenous Q12H  . citalopram  20 mg Oral Daily  . fentaNYL   Intravenous 6 times per day  . lisinopril  2.5 mg Oral Daily  . metoprolol tartrate  12.5 mg Oral BID  . senna-docusate  1 tablet Oral QHS  :  No Known Allergies:  Family History  Problem Relation Age of Onset  . Cancer Mother 14    ovarian  . Heart disease Father   . Stroke Father   . Cancer Sister     ovarian  . Cancer Brother     testicular  :  Social History   Social History  . Marital Status: Married  Spouse Name: N/A  . Number of Children: N/A  . Years of Education: N/A   Occupational History  . Editor    Social History Main Topics  . Smoking status: Never Smoker   . Smokeless tobacco: Never Used  . Alcohol Use: 4.8 oz/week    4 Cans of beer, 4 Glasses of wine per week  . Drug Use: No  . Sexual Activity: Not Currently   Other Topics Concern  . Not on file   Social History Narrative   Married. Education: [employer]. Exercise: jog/walk/ lift weights 7 days a week, 1-2 miles.  :  Pertinent items are noted in HPI.  Exam: Patient Vitals for the past 24 hrs:  BP Temp Temp src Pulse Resp SpO2  07/29/15 0400 - - - - 20 98 %  07/29/15 0124 103/64 mmHg 98.3 F (36.8 C) Oral 84 16 98 %  07/29/15 0012 - - - - (!) 22 96 %  07/28/15 2233 - - - - (!) 24 96 %  07/28/15 2109 116/70 mmHg 98.8 F (37.1 C) Oral 89 18 97 %  07/28/15 2012 - - - - 20 95 %  07/28/15 2000 - - - - 20 95 %  07/28/15 1715 - - - - 19 95 %  07/28/15 1630 130/71 mmHg - - - - -  07/28/15 1500 97/60 mmHg - - 68 - -  07/28/15 1445 (!) 91/57 mmHg - - - - -  07/28/15 1430 94/60 mmHg - - 79 - -  07/28/15 1416 (!) 101/59 mmHg - - 79 - -  07/28/15 1400 (!) 101/58 mmHg - - 80 - -  07/28/15 1345 (!) 101/58 mmHg - - 79 - -  07/28/15 1320 101/66 mmHg - - - - -  07/28/15 1315 - 97.5 F (36.4 C) - 73 18 97 %  07/28/15 1305 111/60 mmHg - - 72 18 97 %  07/28/15 1300 - - - 86 (!) 24 97 %   07/28/15 1250 107/61 mmHg - - 76 15 96 %  07/28/15 1245 - - - 82 (!) 21 95 %  07/28/15 1242 - - - 88 19 95 %  07/28/15 1235 102/63 mmHg - - 80 16 97 %  07/28/15 1230 - - - 82 19 95 %  07/28/15 1216 108/65 mmHg - - 71 (!) 23 96 %  07/28/15 1215 108/65 mmHg - - 70 18 96 %  07/28/15 1205 115/69 mmHg - - 73 15 98 %  07/28/15 1200 115/69 mmHg - - 69 18 100 %  07/28/15 1150 113/76 mmHg - - 66 20 99 %  07/28/15 1145 113/75 mmHg - - 65 17 100 %  07/28/15 1135 112/75 mmHg - - 78 (!) 21 96 %  07/28/15 1130 112/75 mmHg - - 79 20 98 %  07/28/15 1121 116/66 mmHg 97.7 F (36.5 C) - - - -    well-developed and well-nourished white gentleman in no obvious distress. His a chest tube in his right side. His lungs show some decrease over in the right side. Left lung field is clear. Cardiac exam regular rate and rhythm with no murmurs, rubs or bruits. Head and neck exam shows no scleral icterus. He has no oral lesions. He has no adenopathy in the neck. Abdomen is soft. He has good bowel sounds. There is no fluid wave. There is no palpable liver or spleen tip. Extremities shows no clubbing, cyanosis or edema. Skin exam shows no rashes, ecchymoses or petechia.  Recent Labs  07/26/15 1032 07/29/15 0403  WBC 4.8 7.9  HGB 13.4 11.6*  HCT 38.3* 35.2*  PLT 193 173    Recent Labs  07/26/15 1032 07/29/15 0403  NA 138 134*  K 4.3 4.0  CL 106 100*  CO2 25 27  GLUCOSE 96 144*  BUN 26* 10  CREATININE 0.97 1.05  CALCIUM 9.6 8.5*    Blood smear review:  None  Pathology: Pending     Assessment and Plan:  Mr. Howatt is a 54 year old gentleman. He has a history of recurrent Hodgkin's disease. His last recurrence was after stem cell transplant.  I think that at this point, if he has a documented recurrence, I would refer him to [facility] and see if he would qualify for their CAR-T protocol for Hodgkin's disease. I did this to be a great idea for him.  Systemic therapy would probably be with  immunotherapy. The FDA has approved Nivolumab for recurrent Hodgkin's disease post transplant.  For now, we will just have to wait the path report. Hopefully, these lymph nodes are reactive and not malignant. However, because of his past history, we had to be aggressive with documenting whether or not there is recurrence. He fully understands this and agreed that this was the procedure to follow.  We will follow along while he is in the hospital. Hopefully, he will only be in the hospital a day or so and be able to get out for the Labor Day weekend.  I very much appreciate the outstanding care that he has gotten by Brian [name] and all the staff on [place]!!  [place] E

## 2015-07-29 NOTE — Progress Notes (Signed)
Chest tube removed.  Post removal unable to approximate site; benzoin, steri strips, vaseline gauze and 4 by 4 placed. PA notified. Tolerated procedure well. No drainage and site intact. VSS.

## 2015-07-29 NOTE — Progress Notes (Addendum)
[address]       [place] [address]             [phone]          1 Day Post-Op Procedure(s) (LRB): VIDEO BRONCHOSCOPY WITH ENDOBRONCHIAL ULTRASOUND (N/A) LYMPH NODE BIOPSY (N/A) RIGHT VIDEO ASSISTED THORACOSCOPY (Right) NODE DISSECTION (Right)  Subjective: Very sore this am. "Antsy", wants to get up and walk.  Eating well, no nausea.   Objective: Vital signs in last 24 hours: Patient Vitals for the past 24 hrs:  BP Temp Temp src Pulse Resp SpO2  07/29/15 0755 - - - - 18 96 %  07/29/15 0400 - - - - 20 98 %  07/29/15 0124 103/64 mmHg 98.3 F (36.8 C) Oral 84 16 98 %  07/29/15 0012 - - - - (!) 22 96 %  07/28/15 2233 - - - - (!) 24 96 %  07/28/15 2109 116/70 mmHg 98.8 F (37.1 C) Oral 89 18 97 %  07/28/15 2012 - - - - 20 95 %  07/28/15 2000 - - - - 20 95 %  07/28/15 1715 - - - - 19 95 %  07/28/15 1630 130/71 mmHg - - - - -  07/28/15 1500 97/60 mmHg - - 68 - -  07/28/15 1445 (!) 91/57 mmHg - - - - -  07/28/15 1430 94/60 mmHg - - 79 - -  07/28/15 1416 (!) 101/59 mmHg - - 79 - -  07/28/15 1400 (!) 101/58 mmHg - - 80 - -  07/28/15 1345 (!) 101/58 mmHg - - 79 - -  07/28/15 1320 101/66 mmHg - - - - -  07/28/15 1315 - 97.5 F (36.4 C) - 73 18 97 %  07/28/15 1305 111/60 mmHg - - 72 18 97 %  07/28/15 1300 - - - 86 (!) 24 97 %  07/28/15 1250 107/61 mmHg - - 76 15 96 %  07/28/15 1245 - - - 82 (!) 21 95 %  07/28/15 1242 - - - 88 19 95 %  07/28/15 1235 102/63 mmHg - - 80 16 97 %  07/28/15 1230 - - - 82 19 95 %  07/28/15 1216 108/65 mmHg - - 71 (!) 23 96 %  07/28/15 1215 108/65 mmHg - - 70 18 96 %  07/28/15 1205 115/69 mmHg - - 73 15 98 %  07/28/15 1200 115/69 mmHg - - 69 18 100 %  07/28/15 1150 113/76 mmHg - - 66 20 99 %  07/28/15 1145 113/75 mmHg - - 65 17 100 %  07/28/15 1135 112/75 mmHg - - 78 (!) 21 96 %  07/28/15 1130 112/75 mmHg - - 79 20 98 %  07/28/15 1121 116/66 mmHg 97.7 F (36.5 C) - - - -   Current Weight  07/28/15 178 lb  (80.74 kg)     Intake/Output from previous day: 09/01 0701 - 09/02 0700 In: 2645 [P.O.:720; I.V.:1600; IV Piggyback:50] Out: 1875 [Urine:1575; Chest Tube:300]    PHYSICAL EXAM:  Heart: RRR Lungs: Slightly diminished BS in bases Wound: Dressed and dry Chest tube: No air leak    Lab Results: CBC: Recent Labs  07/26/15 1032 07/29/15 0403  WBC 4.8 7.9  HGB 13.4 11.6*  HCT 38.3* 35.2*  PLT 193 173   BMET:  Recent Labs  07/26/15 1032 07/29/15 0403  NA 138 134*  K 4.3 4.0  CL 106 100*  CO2 25 27  GLUCOSE 96 144*  BUN  26* 10  CREATININE 0.97 1.05  CALCIUM 9.6 8.5*    PT/INR:  Recent Labs  07/26/15 1032  LABPROT 15.2  INR 1.18   CXR: FINDINGS: Port-A-Cath tip in the lower SVC region. Right chest tube has been pulled back but still within the chest. Evidence for a small right apical pneumothorax. Heart size is within normal limits. The trachea is midline. Few densities along the medial right lung base and along the course of the chest tube. Blunting at the left costophrenic angle could be related atelectasis or small effusion.  IMPRESSION: Right chest tube has been slightly pulled back. Probable small right apical pneumothorax.  Left basilar densities suggest atelectasis and possibly small pleural effusion.   Assessment/Plan: S/P Procedure(s) (LRB): VIDEO BRONCHOSCOPY WITH ENDOBRONCHIAL ULTRASOUND (N/A) LYMPH NODE BIOPSY (N/A) RIGHT VIDEO ASSISTED THORACOSCOPY (Right) NODE DISSECTION (Right)  CT with no air leak on water seal, CXR stable. CT output around 300 ml over past 24 hours, serous fluid. Continue CT to water seal for now, may be able to d/c soon.  Will add Toradol for pain, continue PCA and po meds.  Mobilize, decrease IVF, start IS/pulm toilet.   LOS: 1 day    COLLINS,GINA H 07/29/2015  Patient seen and examined, agree with above He has only had about 300 ml total from CT since OR- dc CT He wants to stop the PCA once the tube is  out Possibly home in AM

## 2015-07-30 ENCOUNTER — Inpatient Hospital Stay (HOSPITAL_COMMUNITY): Payer: Commercial Managed Care - PPO

## 2015-07-30 LAB — COMPREHENSIVE METABOLIC PANEL
ALBUMIN: 3.1 g/dL — AB (ref 3.5–5.0)
ALK PHOS: 50 U/L (ref 38–126)
ALT: 28 U/L (ref 17–63)
ANION GAP: 5 (ref 5–15)
AST: 33 U/L (ref 15–41)
BILIRUBIN TOTAL: 0.6 mg/dL (ref 0.3–1.2)
BUN: 22 mg/dL — ABNORMAL HIGH (ref 6–20)
CALCIUM: 8.9 mg/dL (ref 8.9–10.3)
CO2: 29 mmol/L (ref 22–32)
Chloride: 103 mmol/L (ref 101–111)
Creatinine, Ser: 1.33 mg/dL — ABNORMAL HIGH (ref 0.61–1.24)
GFR, EST NON AFRICAN AMERICAN: 59 mL/min — AB (ref >60)
GLUCOSE: 110 mg/dL — AB (ref 65–99)
POTASSIUM: 5.1 mmol/L (ref 3.5–5.1)
Sodium: 137 mmol/L (ref 135–145)
TOTAL PROTEIN: 5.3 g/dL — AB (ref 6.5–8.1)

## 2015-07-30 LAB — CBC
HEMATOCRIT: 35.9 % — AB (ref 39.0–52.0)
HEMOGLOBIN: 12.1 g/dL — AB (ref 13.0–17.0)
MCH: 33.2 pg (ref 26.0–34.0)
MCHC: 33.7 g/dL (ref 30.0–36.0)
MCV: 98.6 fL (ref 78.0–100.0)
Platelets: 154 10*3/uL (ref 150–400)
RBC: 3.64 MIL/uL — ABNORMAL LOW (ref 4.22–5.81)
RDW: 13.1 % (ref 11.5–15.5)
WBC: 6.4 10*3/uL (ref 4.0–10.5)

## 2015-07-30 MED ORDER — TRAMADOL HCL 50 MG PO TABS: 50.0000 mg | ORAL_TABLET | Freq: Four times a day (QID) | ORAL | Status: DC | PRN

## 2015-07-30 MED ORDER — CEPHALEXIN 500 MG PO CAPS: 500.0000 mg | ORAL_CAPSULE | Freq: Three times a day (TID) | ORAL | Status: DC

## 2015-07-30 NOTE — Discharge Instructions (Signed)
Video-Assisted Thoracic Surgery °Care After ° Refer to this sheet in the next few weeks. These instructions provide you with information on caring for yourself after your procedure. Your doctor may also give you more specific instructions. Your procedure has been planned according to current medical practices, but problems sometimes occur. Call your doctor if you have any problems or questions after your procedure. °HOME CARE  °· Only take over-the-counter or prescription medicines as told by your doctor. °· Only take pain medicines (narcotics) as told by your doctor. °· Do not drive until your doctor says it is OK. Driving while taking pain medicines or soon after surgery can be dangerous. °· Avoid activities that use your chest muscles for at least 3-4 weeks. These include lifting heavy objects. °· Take deep breaths. This expands the lungs and protects against a lung infection (pneumonia). °· Do breathing exercises as told by your doctor. If you were given a device to help with breathing, use it as told by your doctor. °· You may resume a normal diet and activities when you feel you are able to or as told by your doctor. °· Do not take a bath until your doctor says it is OK. Use the shower instead. °· Keep the bandage (dressing) covering the area where the chest tube was put (incision site) dry for 48 hours. Remove the bandage after 48 hours unless there is new fluid on the bandage. °· Remove bandages as told by your doctor. °· Change bandages if necessary or as told by your doctor. °· Keep all follow-up doctor visits. It is important to see your doctor after surgery. You may need follow-up care and your condition may need to be watched. °GET HELP RIGHT AWAY IF: °· You have a fever. °· You have chest pain. °· You have a rash. °· You have shortness of breath. °· You have trouble breathing. °· You feel weak, lightheaded, dizzy, or faint. °· You feel a lot of pain near an area where a surgical cut was made. °· The  pain at an area where a surgical cut was made is getting worse. °· You notice bleeding, fluid, or pus coming from where a surgical cut was made. °· You notice irritation, puffiness (swelling), or redness near the surgical cut. °· There is a bad smell coming from from a bandage or from an area where a surgical cut was made. °· It feels like your heart is fluttering or beating rapidly. °· Your pain medicine does not relieve your pain. °MAKE SURE YOU:  °· Understand these instructions. °· Will watch your condition. °· Will get help right away if you are not doing well or get worse. °Document Released: 03/09/2013 Document Reviewed: 03/09/2013 °Exit[employer]® Patient Information ©2015 [employer]. This information is not intended to replace advice given to you by your health care provider. Make sure you discuss any questions you have with your health care provider. ° °

## 2015-07-30 NOTE — Progress Notes (Signed)
Contacted Dr. Elnoria Howard per pt request to speak with oncologist this afternoon following discharge.

## 2015-07-30 NOTE — Progress Notes (Addendum)
Pt discharged home.  Alert and oriented x4.  No c/o pain.  Pt does have a slightly reddened area to his porta-cath, and the MD was notified.  Pt was given education on diet, activity, medications, and follow-up care and appointments.  Pt verbalized understanding.  Pt was taken home by family.  Q ball was discontinued.  IV and tele d/cd.

## 2015-07-30 NOTE — Progress Notes (Signed)
Mr. Frayre is looking great. He is quite sore. The chest tube has been taken out.  I don't see any any pathology. The cytology on the transbronchial biopsy of lymph node was non-diagnostic.  He's had no fever. His labs look okay.  It sounds like he was going home today.  We will follow-up with him as now patient once I have his pathology results back. Hopefully, these lymph nodes will be negative for Hodgkin's. If positive, then I will see about getting him down to [facility] for a protocol use another CAR-T cell therapy.  His vital signs are all stable. He is afebrile. His lungs show some slight decrease over on the right side. He has good air movement on the left side. Cardiac exam regular in rhythm with no murmurs, rubs or bruits. Abdomen is soft. Extremities shows no clubbing, cyanosis or edema.  I very much appreciate the outstanding care that he received while on [facility] [place].  I will continue to pray for him.  Pete E.  Psalm 51:10

## 2015-07-30 NOTE — Progress Notes (Addendum)
[address]       [place] [address]             [phone]      2 Days Post-Op Procedure(s) (LRB): VIDEO BRONCHOSCOPY WITH ENDOBRONCHIAL ULTRASOUND (N/A) LYMPH NODE BIOPSY (N/A) RIGHT VIDEO ASSISTED THORACOSCOPY (Right) NODE DISSECTION (Right) Subjective: Feels ok, a little sore  Objective: Vital signs in last 24 hours: Temp:  [98.2 F (36.8 C)-98.6 F (37 C)] 98.4 F (36.9 C) (09/03 0511) Pulse Rate:  [77-90] 89 (09/03 0511) Cardiac Rhythm:  [-] Normal sinus rhythm (09/02 1903) Resp:  [18-19] 18 (09/03 0511) BP: (105-124)/(61-75) 105/71 mmHg (09/03 0511) SpO2:  [95 %-98 %] 95 % (09/03 0511)  Hemodynamic parameters for last 24 hours:    Intake/Output from previous day: 09/02 0701 - 09/03 0700 In: 720 [P.O.:720] Out: 200 [Urine:200] Intake/Output this shift:    General appearance: alert, cooperative and no distress Heart: regular rate and rhythm Lungs: clear to auscultation bilaterally Abdomen: benign Extremities: no edema Wound: dressings CDI  Lab Results:  Recent Labs  07/29/15 0403 07/30/15 0329  WBC 7.9 6.4  HGB 11.6* 12.1*  HCT 35.2* 35.9*  PLT 173 154   BMET:  Recent Labs  07/29/15 0403 07/30/15 0329  NA 134* 137  K 4.0 5.1  CL 100* 103  CO2 27 29  GLUCOSE 144* 110*  BUN 10 22*  CREATININE 1.05 1.33*  CALCIUM 8.5* 8.9    PT/INR: No results for input(s): LABPROT, INR in the last 72 hours. ABG    Component Value Date/Time   PHART 7.419 07/29/2015 0550   HCO3 26.2* 07/29/2015 0550   TCO2 27.5 07/29/2015 0550   ACIDBASEDEF 0.1 05/27/2014 1019   O2SAT 95.3 07/29/2015 0550   CBG (last 3)  No results for input(s): GLUCAP in the last 72 hours.  Meds Scheduled Meds: . acetaminophen  1,000 mg Oral 4 times per day   Or  . acetaminophen (TYLENOL) oral liquid 160 mg/5 mL  1,000 mg Oral 4 times per day  . aspirin  81 mg Oral Daily  . bisacodyl  10 mg Oral Daily  . citalopram  20 mg Oral Daily  . enoxaparin  (LOVENOX) injection  40 mg Subcutaneous Daily  . ketorolac  30 mg Intravenous 4 times per day  . lisinopril  2.5 mg Oral Daily  . metoprolol tartrate  12.5 mg Oral BID  . senna-docusate  1 tablet Oral QHS   Continuous Infusions: . bupivacaine ON-Q pain pump    . dextrose 5 % and 0.45 % NaCl with KCl 20 mEq/L 10 mL/hr at 07/29/15 0848   PRN Meds:.ALPRAZolam, diphenhydrAMINE **OR** diphenhydrAMINE, LORazepam, naloxone **AND** sodium chloride, ondansetron (ZOFRAN) IV, ondansetron (ZOFRAN) IV, oxyCODONE, potassium chloride, traMADol  Xrays Dg Chest 1v Repeat Same Day  07/29/2015   CLINICAL DATA:  Chest tube removal  EXAM: CHEST - 1 VIEW SAME DAY  COMPARISON:  Portable exam 1409 hours compared to 07/29/2015  FINDINGS: Interval removal of RIGHT thoracostomy tube.  RIGHT jugular Port-A-Cath with tip projecting over SVC unchanged.  Surgical clips at LEFT hilum.  Persistent atelectasis RIGHT base.  Small RIGHT apex pneumothorax following chest tube removal, slightly increased since previous exam.  Minimal atelectasis LEFT base.  IMPRESSION: Slightly increased RIGHT pneumothorax following chest tube removal.  Critical Value/emergent results were called by telephone at the time of interpretation on 07/29/2015 at 2:17 pm to [place] [facility] [address], who verbally acknowledged these results.   Electronically Signed  By: Lavonia Dana M.D.   On: 07/29/2015 14:18   Dg Chest Port 1 View  07/29/2015   CLINICAL DATA:  Lymphadenopathy.  Evaluate chest tube.  EXAM: PORTABLE CHEST - 1 VIEW  COMPARISON:  07/28/2015  FINDINGS: Port-A-Cath tip in the lower SVC region. Right chest tube has been pulled back but still within the chest. Evidence for a small right apical pneumothorax. Heart size is within normal limits. The trachea is midline. Few densities along the medial right lung base and along the course of the chest tube. Blunting at the left costophrenic angle could be related atelectasis or small effusion.  IMPRESSION: Right chest  tube has been slightly pulled back. Probable small right apical pneumothorax.  Left basilar densities suggest atelectasis and possibly small pleural effusion.   Electronically Signed   By: Markus Daft M.D.   On: 07/29/2015 08:05   Dg Chest Port 1 View  07/28/2015   CLINICAL DATA:  Chest tube placement for biopsy  EXAM: PORTABLE CHEST - 1 VIEW  COMPARISON:  07/26/2015 chest radiograph  FINDINGS: Low lung volumes. Right apical chest tube is in place. Right internal jugular MediPort terminates at the cavoatrial junction. Surgical clips overlie the left mediastinum. Stable normal cardiac silhouette. Slight widening of the superior mediastinum, which may be due to low lung volumes. No pneumothorax. No pleural effusion. Mild bibasilar atelectasis. No overt pulmonary edema.  IMPRESSION: 1. No pneumothorax.  Right apical chest tube in place. 2. Low lung volumes with mild bibasilar atelectasis. 3. Mild widening of the superior mediastinum, which may be due to low lung volumes. Advise follow-up PA and lateral chest radiographs when clinically feasible.   Electronically Signed   By: Ilona Sorrel M.D.   On: 07/28/2015 13:35   CXR- no pntx  Assessment/Plan: S/P Procedure(s) (LRB): VIDEO BRONCHOSCOPY WITH ENDOBRONCHIAL ULTRASOUND (N/A) LYMPH NODE BIOPSY (N/A) RIGHT VIDEO ASSISTED THORACOSCOPY (Right) NODE DISSECTION (Right) Plan for discharge: see discharge orders   LOS: 2 days    GOLD,WAYNE E 07/30/2015  Noted to have redness over port. He says this is a definite change. There is mild erythema over the port. It blanches. There is no swelling or tenderness. I think to be on the safe side we will cover him with keflex for a week. He will call if it worsens  Remo Lipps C. Roxan Hockey, MD [facility] [phone]

## 2015-08-01 ENCOUNTER — Other Ambulatory Visit: Payer: Self-pay | Admitting: Emergency Medicine

## 2015-08-03 NOTE — Telephone Encounter (Signed)
Faxed

## 2015-08-04 ENCOUNTER — Ambulatory Visit: Payer: Self-pay

## 2015-08-04 NOTE — Progress Notes (Signed)
No sutures in place. All the incision sites are healing well, no signs of infection.

## 2015-08-05 ENCOUNTER — Telehealth: Payer: Self-pay | Admitting: Hematology & Oncology

## 2015-08-05 NOTE — Telephone Encounter (Signed)
I made a phone call to [facility] today. I had spoken previously with Dr. Ailene Ravel, who is the head of their lymphoma program. He says that there is a clinical trial regarding CAR-T cell therapy that Brian [name] would qualify for. However, I have not heard back from his office regarding information that they need on Brian [name].  Today, I spoke with one of the clinical administrators. I left a long message on her answering machine regarding Brian [name] name, date of birth. I told her that all the information that is needed is probably in the Epic system which they can access.  I told her to call us back at [phone] to let us know when his appointment is down a [facility].  I just want to move along quickly so that we can get Brian [name] treated. This is his third or fourth recurrence.  Laurey Arrow

## 2015-08-18 ENCOUNTER — Other Ambulatory Visit: Payer: Self-pay | Admitting: Emergency Medicine

## 2015-08-19 NOTE — Telephone Encounter (Signed)
Pt recently given RF of alprazolam. Last OV I see was in 01/2014 at which time both alprazolam and lorazepam was on med list.

## 2015-08-20 ENCOUNTER — Telehealth: Payer: Self-pay | Admitting: *Deleted

## 2015-08-20 ENCOUNTER — Other Ambulatory Visit: Payer: Self-pay | Admitting: Emergency Medicine

## 2015-08-20 DIAGNOSIS — F439 Reaction to severe stress, unspecified: Secondary | ICD-10-CM

## 2015-08-20 MED ORDER — ALPRAZOLAM 0.5 MG PO TABS: ORAL_TABLET | ORAL | Status: DC

## 2015-08-20 NOTE — Telephone Encounter (Signed)
You can go ahead and refill his Ativan also.

## 2015-08-20 NOTE — Telephone Encounter (Signed)
Called pt about taking the Ativan and Xanax.  He states that he rarely takes the Ativan during the day but he never both of them together.

## 2015-08-20 NOTE — Telephone Encounter (Signed)
Please call and speak with patient. I did refill his alprazolam. Does he want Ativan and alprazolam if so how is he taking the medication.

## 2015-08-22 NOTE — Telephone Encounter (Signed)
Advised pt Rx faxed in.

## 2015-08-22 NOTE — Telephone Encounter (Signed)
Rx faxed

## 2015-08-23 ENCOUNTER — Ambulatory Visit: Payer: Self-pay | Admitting: Thoracic Surgery (Cardiothoracic Vascular Surgery)

## 2015-08-25 ENCOUNTER — Other Ambulatory Visit: Payer: Self-pay | Admitting: Hematology & Oncology

## 2015-08-25 ENCOUNTER — Telehealth: Payer: Self-pay | Admitting: Hematology & Oncology

## 2015-08-25 DIAGNOSIS — C8195 Hodgkin lymphoma, unspecified, lymph nodes of inguinal region and lower limb: Secondary | ICD-10-CM

## 2015-08-25 NOTE — Telephone Encounter (Signed)
Called patient's cell phone. L/m. Patient called back, but did not l/m. I called patient again. Talked with patient regarding his appt for tomorrow Friday, 08/26/2015. Patient stated that he can make this appt.       [facility].

## 2015-08-26 ENCOUNTER — Ambulatory Visit: Payer: Commercial Managed Care - PPO | Admitting: Hematology & Oncology

## 2015-08-26 ENCOUNTER — Ambulatory Visit: Payer: Commercial Managed Care - PPO

## 2015-08-26 ENCOUNTER — Telehealth: Payer: Self-pay | Admitting: Hematology & Oncology

## 2015-08-26 ENCOUNTER — Other Ambulatory Visit: Payer: Commercial Managed Care - PPO

## 2015-08-26 NOTE — Telephone Encounter (Signed)
APPROVE: [phone]  [facility]  To: Veva Holes Fx: [phone] Ph: [phone] ext [phone]  PATIENT: Brian [name]  DOB: 1961/03/24 DOS: 08/30/2015 to 11/26/2015 ID: [id number] DX: C81.95 CODE: [id number] OPDIVO

## 2015-08-29 ENCOUNTER — Other Ambulatory Visit: Payer: Self-pay | Admitting: Thoracic Surgery (Cardiothoracic Vascular Surgery)

## 2015-08-29 ENCOUNTER — Encounter: Payer: Self-pay | Admitting: *Deleted

## 2015-08-29 ENCOUNTER — Telehealth: Payer: Self-pay | Admitting: *Deleted

## 2015-08-29 DIAGNOSIS — R590 Localized enlarged lymph nodes: Secondary | ICD-10-CM

## 2015-08-29 NOTE — Telephone Encounter (Signed)
APPROVE: [phone]  [facility]  To: Veva Holes Fx: [phone] Ph: [phone]  PATIENT: Brian [name]  DOB: 02/17/1961 DOS: 08/30/2015 to 11/26/2015 ID: [id number] DX: C81.95 CODE: [id number] OPDIVO

## 2015-08-30 ENCOUNTER — Ambulatory Visit (HOSPITAL_BASED_OUTPATIENT_CLINIC_OR_DEPARTMENT_OTHER): Payer: Commercial Managed Care - PPO

## 2015-08-30 ENCOUNTER — Encounter: Payer: Self-pay | Admitting: Thoracic Surgery (Cardiothoracic Vascular Surgery)

## 2015-08-30 ENCOUNTER — Encounter: Payer: Self-pay | Admitting: Emergency Medicine

## 2015-08-30 ENCOUNTER — Other Ambulatory Visit (HOSPITAL_BASED_OUTPATIENT_CLINIC_OR_DEPARTMENT_OTHER): Payer: Commercial Managed Care - PPO

## 2015-08-30 ENCOUNTER — Ambulatory Visit (INDEPENDENT_AMBULATORY_CARE_PROVIDER_SITE_OTHER): Payer: Self-pay | Admitting: Thoracic Surgery (Cardiothoracic Vascular Surgery)

## 2015-08-30 ENCOUNTER — Ambulatory Visit (HOSPITAL_BASED_OUTPATIENT_CLINIC_OR_DEPARTMENT_OTHER): Payer: Commercial Managed Care - PPO | Admitting: Hematology & Oncology

## 2015-08-30 ENCOUNTER — Ambulatory Visit
Admission: RE | Admit: 2015-08-30 | Discharge: 2015-08-30 | Disposition: A | Discharge status: Home / Self Care | Payer: Commercial Managed Care - PPO | Source: Ambulatory Visit | Attending: Thoracic Surgery (Cardiothoracic Vascular Surgery) | Admitting: Thoracic Surgery (Cardiothoracic Vascular Surgery)

## 2015-08-30 VITALS — BP 112/73 | HR 69 | Resp 16 | Ht 70.0 in | Wt 175.0 lb

## 2015-08-30 VITALS — BP 118/67 | HR 59 | Temp 98.0°F | Resp 16 | Wt 173.0 lb

## 2015-08-30 DIAGNOSIS — R599 Enlarged lymph nodes, unspecified: Secondary | ICD-10-CM

## 2015-08-30 DIAGNOSIS — Z9889 Other specified postprocedural states: Secondary | ICD-10-CM

## 2015-08-30 DIAGNOSIS — C8195 Hodgkin lymphoma, unspecified, lymph nodes of inguinal region and lower limb: Secondary | ICD-10-CM | POA: Diagnosis not present

## 2015-08-30 DIAGNOSIS — R590 Localized enlarged lymph nodes: Secondary | ICD-10-CM

## 2015-08-30 DIAGNOSIS — C8101 Nodular lymphocyte predominant Hodgkin lymphoma, lymph nodes of head, face, and neck: Secondary | ICD-10-CM

## 2015-08-30 DIAGNOSIS — Z5112 Encounter for antineoplastic immunotherapy: Secondary | ICD-10-CM | POA: Diagnosis not present

## 2015-08-30 DIAGNOSIS — Z9484 Stem cells transplant status: Secondary | ICD-10-CM

## 2015-08-30 LAB — CMP (CANCER CENTER ONLY)
ALBUMIN: 3.7 g/dL (ref 3.3–5.5)
ALK PHOS: 80 U/L (ref 26–84)
ALT(SGPT): 23 U/L (ref 10–47)
AST: 24 U/L (ref 11–38)
BUN: 20 mg/dL (ref 7–22)
CALCIUM: 9.4 mg/dL (ref 8.0–10.3)
CHLORIDE: 105 meq/L (ref 98–108)
CO2: 28 mEq/L (ref 18–33)
Creat: 1 mg/dl (ref 0.6–1.2)
Glucose, Bld: 109 mg/dL (ref 73–118)
POTASSIUM: 4.2 meq/L (ref 3.3–4.7)
Sodium: 139 mEq/L (ref 128–145)
TOTAL PROTEIN: 6.6 g/dL (ref 6.4–8.1)
Total Bilirubin: 0.7 mg/dl (ref 0.20–1.60)

## 2015-08-30 LAB — CBC WITH DIFFERENTIAL (CANCER CENTER ONLY)
BASO#: 0 10*3/uL (ref 0.0–0.2)
BASO%: 0.6 % (ref 0.0–2.0)
EOS ABS: 0.3 10*3/uL (ref 0.0–0.5)
EOS%: 4 % (ref 0.0–7.0)
HCT: 39.1 % (ref 38.7–49.9)
HEMOGLOBIN: 13.3 g/dL (ref 13.0–17.1)
LYMPH#: 0.7 10*3/uL — ABNORMAL LOW (ref 0.9–3.3)
LYMPH%: 11.3 % — ABNORMAL LOW (ref 14.0–48.0)
MCH: 32.8 pg (ref 28.0–33.4)
MCHC: 34 g/dL (ref 32.0–35.9)
MCV: 96 fL (ref 82–98)
MONO#: 0.7 10*3/uL (ref 0.1–0.9)
MONO%: 11.3 % (ref 0.0–13.0)
NEUT%: 72.8 % (ref 40.0–80.0)
NEUTROS ABS: 4.6 10*3/uL (ref 1.5–6.5)
Platelets: 185 10*3/uL (ref 145–400)
RBC: 4.06 10*6/uL — ABNORMAL LOW (ref 4.20–5.70)
RDW: 12.6 % (ref 11.1–15.7)
WBC: 6.3 10*3/uL (ref 4.0–10.0)

## 2015-08-30 LAB — LACTATE DEHYDROGENASE: LDH: 143 U/L (ref 94–250)

## 2015-08-30 MED ORDER — HEPARIN SOD (PORK) LOCK FLUSH 100 UNIT/ML IV SOLN
500.0000 [IU] | Freq: Once | INTRAVENOUS | Status: AC | PRN
  Administered 2015-08-30: 500 [IU]
  Filled 2015-08-30: qty 5

## 2015-08-30 MED ORDER — SODIUM CHLORIDE 0.9 % IV SOLN
Freq: Once | INTRAVENOUS | Status: AC
  Administered 2015-08-30: 13:00:00 via INTRAVENOUS

## 2015-08-30 MED ORDER — SODIUM CHLORIDE 0.9 % IJ SOLN
10.0000 mL | INTRAMUSCULAR | Status: DC | PRN
  Administered 2015-08-30: 10 mL
  Filled 2015-08-30: qty 10

## 2015-08-30 MED ORDER — SODIUM CHLORIDE 0.9 % IV SOLN
240.0000 mg | Freq: Once | INTRAVENOUS | Status: AC
  Administered 2015-08-30: 240 mg via INTRAVENOUS
  Filled 2015-08-30: qty 24

## 2015-08-30 NOTE — Patient Instructions (Addendum)
[facility] Discharge Instructions for Patients Receiving Chemotherapy  Today you received the following chemotherapy agents opdivo.  If you develop nausea and vomiting that is not controlled by your nausea medication, call the clinic. If it is after clinic hours your family physician or the after hours number for the clinic or go to the Emergency Department.   BELOW ARE SYMPTOMS THAT SHOULD BE REPORTED IMMEDIATELY:  *FEVER GREATER THAN 100.5 F  *CHILLS WITH OR WITHOUT FEVER  NAUSEA AND VOMITING THAT IS NOT CONTROLLED WITH YOUR NAUSEA MEDICATION  *UNUSUAL SHORTNESS OF BREATH  *UNUSUAL BRUISING OR BLEEDING  TENDERNESS IN MOUTH AND THROAT WITH OR WITHOUT PRESENCE OF ULCERS  *URINARY PROBLEMS  *BOWEL PROBLEMS  UNUSUAL RASH Items with * indicate a potential emergency and should be followed up as soon as possible.   Please let the nurse know about any problems that you may have experienced. Feel free to call the clinic you have any questions or concerns. The clinic phone number is [phone].   I have been informed and understand all the instructions given to me. I know to contact the clinic, my physician, or go to the Emergency Department if any problems should occur. I do not have any questions at this time, but understand that I may call the clinic during office hours   should I have any questions or need assistance in obtaining follow up care.    __________________________________________  _____________  __________ Signature of Patient or Authorized Representative            Date                   Time    __________________________________________ Nurse's Signature

## 2015-08-30 NOTE — Progress Notes (Signed)
[address]       [place] [address]             [phone]       HPI:  Mr. Fricker returns today for a scheduled postop visit.  He is a 54 yo man with recurrent Hodgkin's disease. He had right hilar adenopathy discovered on a scan. We did a right vats and lymph node biopsies on 07/28/2015. His postoperative course was unremarkable. Pathology showed Hodgkin's disease.  He is having some paresthesias. He is not taking any oxycodone. He says the paresthesias are intermittent. He has them on both sides as he had a previous VATS on the left. Ibuprofen and other over-the-counter medications are ineffective.  Past Medical History  Diagnosis Date  . Wears contact lenses   . H/O autologous stem cell transplant ([place])     may 2014  at [facility]  . History of peptic ulcer   . Mass of right inguinal region   . History of Bell's palsy     2009  RIGHT SIDE--  HAS 80% FUNCTION / PT STATES A LITTLE ASYMETRICAL AND EFFECTS MOUTH  . Hodgkin's disease, nodular sclerosis, of inguinal region/lower limb ([place]) ONOLOGIST--  DR [facility] AND A [facility]      [facility] CHEMO 2013/  AUTOLOGUS STEM CELL TRANSPLANT MAY 2014 AT [facility]  . Dysrhythmia     past hx pvc  . Anxiety   . PVC (premature ventricular contraction)     "benign"  . TIA (transient ischemic attack) 02/2015    "probable TIA"      Current Outpatient Prescriptions  Medication Sig Dispense Refill  . acetaminophen (TYLENOL) 325 MG tablet Take 325 mg by mouth every 6 (six) hours as needed for mild pain.    Marland Kitchen ALPRAZolam (XANAX) 0.5 MG tablet take 1 tablet by mouth at bedtime if needed for sleep 30 tablet 2  . aspirin 81 MG chewable tablet Chew 1 tablet (81 mg total) by mouth daily. 30 tablet 0  . Cholecalciferol (VITAMIN D-3) 1000 UNITS CAPS Take 1 capsule by mouth daily.     . citalopram (CELEXA) 20 MG tablet Take 1 tablet (20 mg total) by mouth daily. 30 tablet 6  . lidocaine-prilocaine (EMLA) cream Apply 1 application topically as  needed. Apply nickel sized amount to portacath site at least 1 hour prior to chemotherapy. 30 g 1  . lisinopril (PRINIVIL,ZESTRIL) 2.5 MG tablet Take 1 tablet (2.5 mg total) by mouth daily. 30 tablet 6  . LORazepam (ATIVAN) 1 MG tablet TAKE 1/2 A TABLET BY MOUTH TWICE DAILY DURING THE DAY FOR ANXIETY, AND 1 TABLET AT BEDTIME IF NEEDED 20 tablet 3  . metoprolol tartrate (LOPRESSOR) 25 MG tablet Take 0.5 tablets (12.5 mg total) by mouth 2 (two) times daily. 60 tablet 6  . Multiple Vitamin (MULTIVITAMIN) tablet Take 1 tablet by mouth daily.     No current facility-administered medications for this visit.    Physical Exam BP 112/73 mmHg  Pulse 69  Resp 16  Ht 5\' 10"  (1.778 m)  Wt 175 lb (79.379 kg)  BMI 25.11 kg/m2  SpO77 1% 54 year old man in no acute distress Alert and oriented 3 with no focal deficits Incision well-healed Lungs clear with equal breath sounds bilaterally No peripheral edema  Diagnostic Tests: I reviewed his chest x-ray. It shows no effusions or infiltrates.  Impression: 54 year old man with recurrent Hodgkin's disease who had a right VATS for diagnostic purposes about a month ago.  He is doing very well. He does still have some neuropathic type pain. He does not want to take narcotics for that. I discussed potentially giving him gabapentin or Lyrica for that pain. He does not want to do that at this point, but we'll keep in mind.  His activities are unrestricted.  He is going to take part in a clinical trial at [facility] with T-cell therapy. He will be on nivolumab in the meantime until that starts.  Plan: I will be happy to see him back any time if I can be of any further assistance with his care.  Melrose Nakayama, MD [facility] [phone]

## 2015-08-31 NOTE — Progress Notes (Signed)
Hematology and Oncology Follow Up Visit  Brian [name] [id number] [birth year] 54 y.o. 08/31/2015   Principle Diagnosis:  Recurrent Hodgkin's Disease -  S/p autologous transplant  TIA-resolved  Current Therapy:    S/p Adcetris x 12 cycles     Interim History:  [name] is in for followup. He has documented recurrent Hodgkin's disease. This is his third recurrence. This is his second recurrence after his autologous stem cell transplant. He did undergo a VATS procedure. This was done on September 1. The pathology report [id number]) shows classical Hodgkin's disease in a couple lymph nodes.  He has been seen at [facility] for consideration of CAR-T cell therapy. They have a protocol down there. He saw Dr. Hampton [name]. It sounds like they will not be able to do this until late this year or early next year.  I spoke with Dr. Hampton [name]. He recommended that we start him on Nivolumab. I did this is very reasonable.  He looks very good. He feels okay. He is trying exercise. His appetite is okay. He's had no nausea vomiting. He's had no change in bowel or bladder habits. He's had no rashes. He's had no pruritus  He still has some numbness and weakness in the left foot. This may been from the TIA that he had. He continues on baby aspirin.  Overall, his performance status is ECOG 0.   Medications:  Current outpatient prescriptions:  .  acetaminophen (TYLENOL) 325 MG tablet, Take 325 mg by mouth every 6 (six) hours as needed for mild pain., Disp: , Rfl:  .  ALPRAZolam (XANAX) 0.5 MG tablet, take 1 tablet by mouth at bedtime if needed for sleep, Disp: 30 tablet, Rfl: 2 .  aspirin 81 MG chewable tablet, Chew 1 tablet (81 mg total) by mouth daily., Disp: 30 tablet, Rfl: 0 .  Cholecalciferol (VITAMIN D-3) 1000 UNITS CAPS, Take 1 capsule by mouth daily. , Disp: , Rfl:  .  citalopram (CELEXA) 20 MG tablet, Take 1 tablet (20 mg total) by mouth daily., Disp: 30 tablet, Rfl: 6 .  lidocaine-prilocaine (EMLA)  cream, Apply 1 application topically as needed. Apply nickel sized amount to portacath site at least 1 hour prior to chemotherapy., Disp: 30 g, Rfl: 1 .  lisinopril (PRINIVIL,ZESTRIL) 2.5 MG tablet, Take 1 tablet (2.5 mg total) by mouth daily., Disp: 30 tablet, Rfl: 6 .  LORazepam (ATIVAN) 1 MG tablet, TAKE 1/2 A TABLET BY MOUTH TWICE DAILY DURING THE DAY FOR ANXIETY, AND 1 TABLET AT BEDTIME IF NEEDED, Disp: 20 tablet, Rfl: 3 .  metoprolol tartrate (LOPRESSOR) 25 MG tablet, Take 0.5 tablets (12.5 mg total) by mouth 2 (two) times daily., Disp: 60 tablet, Rfl: 6 .  Multiple Vitamin (MULTIVITAMIN) tablet, Take 1 tablet by mouth daily., Disp: , Rfl:   Allergies: No Known Allergies  Past Medical History, Surgical history, Social history, and Family History were reviewed and updated.  Review of Systems: As above  Physical Exam:  weight is 173 lb (78.472 kg). His oral temperature is 98 F (36.7 C). His blood pressure is 118/67 and his pulse is 59. His respiration is 16.   Well-developed and well-nourished white gentleman. Head and neck exam shows no ocular or oral lesions. There are no palpable cervical or supraclavicular lymph nodes. Lungs are clear. Cardiac exam regular rate and rhythm with no murmurs, rubs or bruits.. Abdomen is soft. He has good bowel sounds. There is no fluid wave. There is no palpable liver or spleen tip.  Back exam shows a thoracoscopy site in the left lateral chest wall. This is healed. There is no swelling or erythema. Extremities shows no clubbing, cyanosis or edema. Skin exam shows no rashes, ecchymoses or petechia . Neurological exam is nonfocal.  Lab Results  Component Value Date   WBC 6.3 08/30/2015   HGB 13.3 08/30/2015   HCT 39.1 08/30/2015   MCV 96 08/30/2015   PLT 185 08/30/2015     Chemistry      Component Value Date/Time   NA 139 08/30/2015 1115   NA 137 07/30/2015 0329   NA 142 03/25/2013 0828   K 4.2 08/30/2015 1115   K 5.1 07/30/2015 0329   K 4.1  03/25/2013 0828   CL 105 08/30/2015 1115   CL 103 07/30/2015 0329   CL 107 03/25/2013 0828   CO2 28 08/30/2015 1115   CO2 29 07/30/2015 0329   CO2 27 03/25/2013 0828   BUN 20 08/30/2015 1115   BUN 22* 07/30/2015 0329   BUN 12.7 03/25/2013 0828   CREATININE 1.0 08/30/2015 1115   CREATININE 1.33* 07/30/2015 0329   CREATININE 0.9 03/25/2013 0828      Component Value Date/Time   CALCIUM 9.4 08/30/2015 1115   CALCIUM 8.9 07/30/2015 0329   CALCIUM 9.2 03/25/2013 0828   ALKPHOS 80 08/30/2015 1115   ALKPHOS 50 07/30/2015 0329   ALKPHOS 122 03/25/2013 0828   AST 24 08/30/2015 1115   AST 33 07/30/2015 0329   AST 33 03/25/2013 0828   ALT 23 08/30/2015 1115   ALT 28 07/30/2015 0329   ALT 65* 03/25/2013 0828   BILITOT 0.70 08/30/2015 1115   BILITOT 0.6 07/30/2015 0329   BILITOT 0.31 03/25/2013 0828         Impression and Plan: Brian [name] is a 54 year old gentleman.  He has history of recurrent Hodgkin's disease. 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 He then had a recurrence after this.  We will go ahead and get him started on palivizumab. I talked to him about this. I did this to be very reasonable and very well-tolerated. He was still be able to work.  I went over the potential side effects with him. I told him about the low possibility of lung inflammation. He may have some fatigue. He may have some diarrhea. I would think the likelihood of it and it is happening would be less than 5%.  We'll go ahead and start him today.  I'm not sure when to do any type of scans on him. It is possible and may do another PET scan in the late fall.  I'm just being full that there is a protocol that he would qualify for. I think using the CAR- T cell therapy makes a lot of sense.  We'll plan for follow-up in 2 weeks.  I spent about 35 minutes with him today.     I [name], MD 10/5/20167:17 AM

## 2015-09-12 NOTE — Progress Notes (Signed)
Patient ID: Brian [name], male   DOB: Sep 02, 1961, 54 y.o.   MRN: [id number]     Cardiology Office Note   Date:  09/14/2015   ID:  Brian [name], DOB 1961-07-12, MRN [id number]  PCP:  Jenny Reichmann, MD  Cardiologist:   Jenkins Rouge, MD   No chief complaint on file.     History of Present Illness: Brian [name] is a 54 y.o. male who presents for cardiomyopathy.  March 2016  ? TIA.  Had slurred speech for a minute.  W/U negative MRI normal Telemetry normal no PAF.  Echo reviewed and EF 40% diffuse hypokinesis.  Compared to ecoh done 05/2014 EF 60% with normal GLS -22.  He has recurrent Hodgkin Lymphoma.   Post stem cell transplant 2 years ago at [facility] had multiple rounds chemo and XRT over last 3 years.  Currently has had 12 monthly rounds of Adcetris and is due for 4 more with Dr Marin Olp.  No clinical CHF.  Has not started beta blocker or ACE prescribed by hospitalist.  Also has had Bells Palsy on right which clouds matter of slurred speech.  Under some stress.  Separated from wife and has two boys.  Works at [employer] with my nephew's wife No dyspnea  Use to run a lot but more sedentary now   August 2016 had thoroscopy and VATS with lymph node biopsy Dr Roxan Hockey Biopsy with Hodgkins Lymphoma Started on Nivolumab.by Dr Marin Olp ? Going to [facility] latter in year for CAR-t cell Rx.    F/U MRI 03/11/15 with normal EF and no delayed enhancement  IMPRESSION: 1) Mild LVE EF 53% no discrete RWMA;s  2) Normal cardiac valves  3) Normal atria  4) Normal RV  5) No delayed enhancement or scar tissue in LV myocardium  ETT 03/11/15  Reviewed no ischemia isolated PVC;s no VT    Past Medical History  Diagnosis Date  . Wears contact lenses   . H/O autologous stem cell transplant ([place])     may 2014  at [facility]  . History of peptic ulcer   . Mass of right inguinal region   . History of Bell's palsy     2009  RIGHT SIDE--  HAS 80% FUNCTION / PT STATES A LITTLE ASYMETRICAL AND EFFECTS MOUTH  .  Hodgkin's disease, nodular sclerosis, of inguinal region/lower limb ([place]) ONOLOGIST--  DR ENNEVER AND A [facility]      SALVAGE CHEMO 2013/  AUTOLOGUS STEM CELL TRANSPLANT MAY 2014 AT [facility]  . Dysrhythmia     past hx pvc  . Anxiety   . PVC (premature ventricular contraction)     "benign"  . TIA (transient ischemic attack) 02/2015    "probable TIA"    Past Surgical History  Procedure Laterality Date  . Axillary lymph node biopsy Left 02/02/2013    Procedure: NEEDLE LOCALIZED AXILLARY LYMPH NODE BIOPSY;  Surgeon: Haywood Lasso, MD;  Location: [facility];  Service: General;  Laterality: Left;  . Cyst removal neck Left 1980  . Removal right inguinal lymph nodes  08-16-2011  . Left inguinal lymph node bx  09-09-2012  . Transthoracic echocardiogram  03-18-2013    MILD LVH/  EF 55-60%  . Scrotal exploration Right 01/04/2014    Procedure: SCROTUM EXPLORATION   INGUINAL , EXCISION OF CYSTIC MASS OF RIGHT SPERMATIC CORD, WITH FROZEN SECTION;  Surgeon: Franchot Gallo, MD;  Location: [facility];  Service: Urology;  Laterality: Right;  . Pleura  biopsy Left 05/31/2014  . Video assisted thoracoscopy (vats)/wedge resection  05/31/2014  . Bone marrow biopsy  08/2012  . Video assisted thoracoscopy Left 05/31/2014    Procedure: LEFT VIDEO ASSISTED THORACOSCOPY, PLEURAL BIOPSY;  Surgeon: Melrose Nakayama, MD;  Location: [facility];  Service: Thoracic;  Laterality: Left;  . Video assisted thoracoscopy (vats)/ lymph node sampling Right 07/28/2015  . Video bronchoscopy with endobronchial ultrasound  07/28/2015  . Video bronchoscopy with endobronchial ultrasound N/A 07/28/2015    Procedure: VIDEO BRONCHOSCOPY WITH ENDOBRONCHIAL ULTRASOUND;  Surgeon: Melrose Nakayama, MD;  Location: [facility];  Service: Thoracic;  Laterality: N/A;  . Lymph node biopsy N/A 07/28/2015    Procedure: LYMPH NODE BIOPSY;  Surgeon: Melrose Nakayama, MD;  Location: [facility];  Service: Thoracic;  Laterality: N/A;  .  Video assisted thoracoscopy Right 07/28/2015    Procedure: RIGHT VIDEO ASSISTED THORACOSCOPY;  Surgeon: Melrose Nakayama, MD;  Location: [facility];  Service: Thoracic;  Laterality: Right;  . Node dissection Right 07/28/2015    Procedure: NODE DISSECTION;  Surgeon: Melrose Nakayama, MD;  Location: [facility];  Service: Thoracic;  Laterality: Right;     Current Outpatient Prescriptions  Medication Sig Dispense Refill  . acetaminophen (TYLENOL) 325 MG tablet Take 325 mg by mouth every 6 (six) hours as needed for mild pain.    Marland Kitchen ALPRAZolam (XANAX) 0.5 MG tablet take 1 tablet by mouth at bedtime if needed for sleep 30 tablet 2  . aspirin 81 MG chewable tablet Chew 1 tablet (81 mg total) by mouth daily. 30 tablet 0  . Cholecalciferol (VITAMIN D-3) 1000 UNITS CAPS Take 1 capsule by mouth daily.     . citalopram (CELEXA) 20 MG tablet Take 1 tablet (20 mg total) by mouth daily. 30 tablet 6  . FLUARIX QUADRIVALENT 0.5 ML injection Inject as directed once.  0  . lidocaine-prilocaine (EMLA) cream Apply 1 application topically as needed. Apply nickel sized amount to portacath site at least 1 hour prior to chemotherapy. 30 g 1  . lisinopril (PRINIVIL,ZESTRIL) 2.5 MG tablet Take 1 tablet (2.5 mg total) by mouth daily. 30 tablet 6  . LORazepam (ATIVAN) 1 MG tablet TAKE 1/2 A TABLET BY MOUTH TWICE DAILY DURING THE DAY FOR ANXIETY, AND 1 TABLET AT BEDTIME IF NEEDED 20 tablet 3  . metoprolol tartrate (LOPRESSOR) 25 MG tablet Take 0.5 tablets (12.5 mg total) by mouth 2 (two) times daily. 60 tablet 6  . Multiple Vitamin (MULTIVITAMIN) tablet Take 1 tablet by mouth daily.     No current facility-administered medications for this visit.    Allergies:   Review of patient's allergies indicates no known allergies.    Social History:  The patient  reports that he has never smoked. He has never used smokeless tobacco. He reports that he drinks about 4.8 oz of alcohol per week. He reports that he does not use illicit  drugs.   Family History:  The patient's family history includes Cancer in his brother and sister; Cancer (age of onset: 52) in his mother; Heart disease in his father; Stroke in his father.    ROS:  Please see the history of present illness.   Otherwise, review of systems are positive for none.   All other systems are reviewed and negative.    PHYSICAL EXAM: VS:  BP 90/74 mmHg  Pulse 85  Ht 5' 10"  (1.778 m)  Wt 78.563 kg (173 lb 3.2 oz)  BMI 24.85 kg/m2  SpO2 98% , BMI  Body mass index is 24.85 kg/(m^2). GEN: Well nourished, well developed, in no acute distress HEENT: normal Neck: no JVD, carotid bruits, or masses Cardiac:  RRR; no murmurs, rubs, or gallops,no edema  Respiratory:  clear to auscultation bilaterally, normal work of breathing GI: soft, nontender, nondistended, + BS MS: no deformity or atrophy Skin: warm and dry, no rash Neuro:  Mild residual right sided facial weakness from Bell's palsy Psych: euthymic mood, full affect   EKG:   03/11/15  NSR PVC;s    Recent Labs: 08/30/2015: ALT(SGPT) 23; BUN, Bld 20; Creat 1.0; HGB 13.3; Platelets 185; Potassium 4.2; Sodium 139    Lipid Panel    Component Value Date/Time   CHOL 175 03/17/2015 0923   TRIG 87 03/17/2015 0923   HDL 41 03/17/2015 0923   CHOLHDL 4.3 03/17/2015 0923   VLDL 17 03/17/2015 0923   LDLCALC 117* 03/17/2015 0923      Wt Readings from Last 3 Encounters:  09/14/15 78.563 kg (173 lb 3.2 oz)  08/30/15 78.472 kg (173 lb)  08/30/15 79.379 kg (175 lb)      Other studies Reviewed: Additional studies/ records that were reviewed today include: hospital D/C summary Echo MRI and Labs.    ASSESSMENT AND PLAN:  1.  DCM:  EF normal by MRI 02/2015  D/C  ACE and beta blocker   2. Recurrent Lymphoma: f/u [facility].  On new meds  Research study with T cell modulation at [facility] this winter  3. TIA  Etiology unresolved f/u neuro no evidence of cardiac SOE or PAF  Residual right sided facial weakness from Bell's  palsy  4. Anxiety / Depression:  Related to recurrent cancer and separation from wife  Continue celexa and xanax    Current medicines are reviewed at length with the patient today.  The patient does not have concerns regarding medicines.  The following changes have been made:  Stop beta blocker and ACE   Labs/ tests ordered today include:  None   No orders of the defined types were placed in this encounter.     Disposition:   FU with  Me in 70month        Signed, PJenkins Rouge MD  09/14/2015 9:22 AM    [facility] [address] [place] [place]  [id number]: [phone] Fax: [phone]

## 2015-09-13 ENCOUNTER — Telehealth: Payer: Self-pay | Admitting: *Deleted

## 2015-09-13 NOTE — Telephone Encounter (Signed)
[facility] Case Manager assigned to this patient. Please call directly for approvals and prior authorization.  Brian [name] [phone] [facility] [phone] (fax) [phone]

## 2015-09-14 ENCOUNTER — Encounter: Payer: Self-pay | Admitting: Cardiovascular Disease

## 2015-09-14 ENCOUNTER — Telehealth: Payer: Self-pay | Admitting: Cardiovascular Disease

## 2015-09-14 ENCOUNTER — Other Ambulatory Visit (HOSPITAL_BASED_OUTPATIENT_CLINIC_OR_DEPARTMENT_OTHER): Payer: Commercial Managed Care - PPO

## 2015-09-14 ENCOUNTER — Ambulatory Visit (INDEPENDENT_AMBULATORY_CARE_PROVIDER_SITE_OTHER): Payer: Commercial Managed Care - PPO | Admitting: Cardiovascular Disease

## 2015-09-14 ENCOUNTER — Ambulatory Visit (HOSPITAL_BASED_OUTPATIENT_CLINIC_OR_DEPARTMENT_OTHER): Payer: Commercial Managed Care - PPO

## 2015-09-14 ENCOUNTER — Ambulatory Visit (HOSPITAL_BASED_OUTPATIENT_CLINIC_OR_DEPARTMENT_OTHER): Payer: Commercial Managed Care - PPO | Admitting: Hematology & Oncology

## 2015-09-14 VITALS — BP 90/74 | HR 85 | Ht 70.0 in | Wt 173.2 lb

## 2015-09-14 VITALS — BP 111/68 | HR 78 | Temp 98.1°F | Resp 16 | Wt 171.8 lb

## 2015-09-14 DIAGNOSIS — C8195 Hodgkin lymphoma, unspecified, lymph nodes of inguinal region and lower limb: Secondary | ICD-10-CM

## 2015-09-14 DIAGNOSIS — R634 Abnormal weight loss: Secondary | ICD-10-CM

## 2015-09-14 DIAGNOSIS — Z5112 Encounter for antineoplastic immunotherapy: Secondary | ICD-10-CM

## 2015-09-14 DIAGNOSIS — R590 Localized enlarged lymph nodes: Secondary | ICD-10-CM

## 2015-09-14 DIAGNOSIS — J948 Other specified pleural conditions: Secondary | ICD-10-CM

## 2015-09-14 DIAGNOSIS — R1013 Epigastric pain: Secondary | ICD-10-CM

## 2015-09-14 DIAGNOSIS — Z9484 Stem cells transplant status: Secondary | ICD-10-CM

## 2015-09-14 DIAGNOSIS — Z8669 Personal history of other diseases of the nervous system and sense organs: Secondary | ICD-10-CM

## 2015-09-14 DIAGNOSIS — R4781 Slurred speech: Secondary | ICD-10-CM

## 2015-09-14 DIAGNOSIS — R4701 Aphasia: Secondary | ICD-10-CM

## 2015-09-14 DIAGNOSIS — I422 Other hypertrophic cardiomyopathy: Secondary | ICD-10-CM

## 2015-09-14 DIAGNOSIS — R591 Generalized enlarged lymph nodes: Secondary | ICD-10-CM

## 2015-09-14 LAB — CMP (CANCER CENTER ONLY)
ALT(SGPT): 20 U/L (ref 10–47)
AST: 27 U/L (ref 11–38)
Albumin: 4.1 g/dL (ref 3.3–5.5)
Alkaline Phosphatase: 102 U/L — ABNORMAL HIGH (ref 26–84)
BUN: 20 mg/dL (ref 7–22)
CHLORIDE: 103 meq/L (ref 98–108)
CO2: 28 mEq/L (ref 18–33)
CREATININE: 1.1 mg/dL (ref 0.6–1.2)
Calcium: 9.7 mg/dL (ref 8.0–10.3)
GLUCOSE: 94 mg/dL (ref 73–118)
POTASSIUM: 4.3 meq/L (ref 3.3–4.7)
SODIUM: 138 meq/L (ref 128–145)
Total Bilirubin: 0.9 mg/dl (ref 0.20–1.60)
Total Protein: 7.1 g/dL (ref 6.4–8.1)

## 2015-09-14 LAB — CBC WITH DIFFERENTIAL (CANCER CENTER ONLY)
BASO#: 0 10*3/uL (ref 0.0–0.2)
BASO%: 0.8 % (ref 0.0–2.0)
EOS%: 3.8 % (ref 0.0–7.0)
Eosinophils Absolute: 0.2 10*3/uL (ref 0.0–0.5)
HCT: 39.5 % (ref 38.7–49.9)
HGB: 13.4 g/dL (ref 13.0–17.1)
LYMPH#: 1 10*3/uL (ref 0.9–3.3)
LYMPH%: 18.6 % (ref 14.0–48.0)
MCH: 32.5 pg (ref 28.0–33.4)
MCHC: 33.9 g/dL (ref 32.0–35.9)
MCV: 96 fL (ref 82–98)
MONO#: 0.6 10*3/uL (ref 0.1–0.9)
MONO%: 11.9 % (ref 0.0–13.0)
NEUT#: 3.4 10*3/uL (ref 1.5–6.5)
NEUT%: 64.9 % (ref 40.0–80.0)
PLATELETS: 240 10*3/uL (ref 145–400)
RBC: 4.12 10*6/uL — ABNORMAL LOW (ref 4.20–5.70)
RDW: 12.6 % (ref 11.1–15.7)
WBC: 5.3 10*3/uL (ref 4.0–10.0)

## 2015-09-14 LAB — LACTATE DEHYDROGENASE: LDH: 152 U/L (ref 94–250)

## 2015-09-14 MED ORDER — SODIUM CHLORIDE 0.9 % IV SOLN
240.0000 mg | Freq: Once | INTRAVENOUS | Status: AC
  Administered 2015-09-14: 240 mg via INTRAVENOUS
  Filled 2015-09-14: qty 20

## 2015-09-14 MED ORDER — SODIUM CHLORIDE 0.9 % IJ SOLN
10.0000 mL | INTRAMUSCULAR | Status: DC | PRN
  Administered 2015-09-14: 10 mL via INTRAVENOUS
  Filled 2015-09-14: qty 10

## 2015-09-14 MED ORDER — HEPARIN SOD (PORK) LOCK FLUSH 100 UNIT/ML IV SOLN
500.0000 [IU] | Freq: Once | INTRAVENOUS | Status: AC
  Administered 2015-09-14: 500 [IU] via INTRAVENOUS
  Filled 2015-09-14: qty 5

## 2015-09-14 MED ORDER — SODIUM CHLORIDE 0.9 % IV SOLN
Freq: Once | INTRAVENOUS | Status: AC
  Administered 2015-09-14: 14:00:00 via INTRAVENOUS

## 2015-09-14 NOTE — Telephone Encounter (Signed)
Given previous TIA would continue to take 81 mg ASA

## 2015-09-14 NOTE — Telephone Encounter (Signed)
PT  NOTIFIED ./CY 

## 2015-09-14 NOTE — Patient Instructions (Addendum)
Medication Instructions:  STOP  LISINOPRIL STOP METOPROLOL Labwork: NONE  Testing/Procedures: Your physician has requested that you have an echocardiogram. Echocardiography is a painless test that uses sound waves to create images of your heart. It provides your doctor with information about the size and shape of your heart and how well your heart's chambers and valves are working. This procedure takes approximately one hour. There are no restrictions for this procedure. IN  6 MONTHS   Follow-Up: Your physician wants you to follow-up in: [place] will receive a reminder letter in the mail two months in advance. If you don't receive a letter, please call our office to schedule the follow-up appointment.   Any Other Special Instructions Will Be Listed Below (If Applicable).

## 2015-09-14 NOTE — Progress Notes (Signed)
Hematology and Oncology Follow Up Visit  Brian [name] [id number] Feb 23, 1961 54 y.o. 09/14/2015   Principle Diagnosis:  Recurrent Hodgkin's Disease -  S/p autologous transplant  TIA-resolved  Current Therapy:     Nivolumab s/p c#1     Interim History:  [name] is in for followup. He is doing quite well. He tolerated his first cycle of Nivolumab without any problem. He does have a little bit of fatigue.  He still has some numbness over in the left foot. I will know if this was from the TIA that he had.  There is some numbness in his hands. This seems to be getting a little bit better.  He's had no problems with fever. He's had no rashes. He's had no change in bowel or bladder habits.  He's had no cough.  He says of the clinical trial that he will be part of for the CAR-T-cell protocol probably will not take him until February.  He's had no issues with fever. He's had no mass was. His appetite has been good.  Overall, his performance status is ECOG 1.   Medications:  Current outpatient prescriptions:  .  acetaminophen (TYLENOL) 325 MG tablet, Take 325 mg by mouth every 6 (six) hours as needed for mild pain., Disp: , Rfl:  .  ALPRAZolam (XANAX) 0.5 MG tablet, take 1 tablet by mouth at bedtime if needed for sleep, Disp: 30 tablet, Rfl: 2 .  aspirin 81 MG chewable tablet, Chew 1 tablet (81 mg total) by mouth daily., Disp: 30 tablet, Rfl: 0 .  Cholecalciferol (VITAMIN D-3) 1000 UNITS CAPS, Take 1 capsule by mouth daily. , Disp: , Rfl:  .  citalopram (CELEXA) 20 MG tablet, Take 1 tablet (20 mg total) by mouth daily., Disp: 30 tablet, Rfl: 6 .  FLUARIX QUADRIVALENT 0.5 ML injection, Inject as directed once., Disp: , Rfl: 0 .  lidocaine-prilocaine (EMLA) cream, Apply 1 application topically as needed. Apply nickel sized amount to portacath site at least 1 hour prior to chemotherapy., Disp: 30 g, Rfl: 1 .  LORazepam (ATIVAN) 1 MG tablet, TAKE 1/2 A TABLET BY MOUTH TWICE DAILY DURING  THE DAY FOR ANXIETY, AND 1 TABLET AT BEDTIME IF NEEDED, Disp: 20 tablet, Rfl: 3 .  Multiple Vitamin (MULTIVITAMIN) tablet, Take 1 tablet by mouth daily., Disp: , Rfl:  No current facility-administered medications for this visit.  Facility-Administered Medications Ordered in Other Visits:  .  nivolumab (OPDIVO) 240 mg in sodium chloride 0.9 % 100 mL chemo infusion, 240 mg, Intravenous, Once, [name], MD  Allergies: No Known Allergies  Past Medical History, Surgical history, Social history, and Family History were reviewed and updated.  Review of Systems: As above  Physical Exam:  weight is 171 lb 12.8 oz (77.928 kg). His oral temperature is 98.1 F (36.7 C). His blood pressure is 111/68 and his pulse is 78. His respiration is 16 and oxygen saturation is 99%.   Well-developed and well-nourished white gentleman. Head and neck exam shows no ocular or oral lesions. There are no palpable cervical or supraclavicular lymph nodes. Lungs are clear. Cardiac exam regular rate and rhythm with no murmurs, rubs or bruits.. Abdomen is soft. He has good bowel sounds. There is no fluid wave. There is no palpable liver or spleen tip. Back exam shows a thoracoscopy site in the left lateral chest wall. This is healed. There is no swelling or erythema. Extremities shows no clubbing, cyanosis or edema. Skin exam shows no rashes,  ecchymoses or petechia . Neurological exam is nonfocal.  Lab Results  Component Value Date   WBC 5.3 09/14/2015   HGB 13.4 09/14/2015   HCT 39.5 09/14/2015   MCV 96 09/14/2015   PLT 240 09/14/2015     Chemistry      Component Value Date/Time   NA 138 09/14/2015 1204   NA 137 07/30/2015 0329   NA 142 03/25/2013 0828   K 4.3 09/14/2015 1204   K 5.1 07/30/2015 0329   K 4.1 03/25/2013 0828   CL 103 09/14/2015 1204   CL 103 07/30/2015 0329   CL 107 03/25/2013 0828   CO2 28 09/14/2015 1204   CO2 29 07/30/2015 0329   CO2 27 03/25/2013 0828   BUN 20 09/14/2015 1204   BUN  22* 07/30/2015 0329   BUN 12.7 03/25/2013 0828   CREATININE 1.1 09/14/2015 1204   CREATININE 1.33* 07/30/2015 0329   CREATININE 0.9 03/25/2013 0828      Component Value Date/Time   CALCIUM 9.7 09/14/2015 1204   CALCIUM 8.9 07/30/2015 0329   CALCIUM 9.2 03/25/2013 0828   ALKPHOS 102* 09/14/2015 1204   ALKPHOS 50 07/30/2015 0329   ALKPHOS 122 03/25/2013 0828   AST 27 09/14/2015 1204   AST 33 07/30/2015 0329   AST 33 03/25/2013 0828   ALT 20 09/14/2015 1204   ALT 28 07/30/2015 0329   ALT 65* 03/25/2013 0828   BILITOT 0.90 09/14/2015 1204   BILITOT 0.6 07/30/2015 0329   BILITOT 0.31 03/25/2013 0828         Impression and Plan: [name] is a 54 year old gentleman.  He has history of recurrent Hodgkin's disease. 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 He then had a recurrence after this.  So far, he's tolerated the Nivolumab well.  We will continue to treat him every 2 weeks.  It sounds like he will not have the CAR-T cell therapy until February at the earliest.  I probably will repeat a PET scan on him sometime after Thanksgiving.  I will plan to see him back in 2 weeks.  I spent about 35 minutes with him today.     I [name], MD 10/19/20161:58 PM

## 2015-09-14 NOTE — Patient Instructions (Signed)
[facility] Discharge Instructions for Patients Receiving Chemotherapy  Today you received the following chemotherapy agents opdivo.  If you develop nausea and vomiting that is not controlled by your nausea medication, call the clinic. If it is after clinic hours your family physician or the after hours number for the clinic or go to the Emergency Department.   BELOW ARE SYMPTOMS THAT SHOULD BE REPORTED IMMEDIATELY:  *FEVER GREATER THAN 100.5 F  *CHILLS WITH OR WITHOUT FEVER  NAUSEA AND VOMITING THAT IS NOT CONTROLLED WITH YOUR NAUSEA MEDICATION  *UNUSUAL SHORTNESS OF BREATH  *UNUSUAL BRUISING OR BLEEDING  TENDERNESS IN MOUTH AND THROAT WITH OR WITHOUT PRESENCE OF ULCERS  *URINARY PROBLEMS  *BOWEL PROBLEMS  UNUSUAL RASH Items with * indicate a potential emergency and should be followed up as soon as possible.   Please let the nurse know about any problems that you may have experienced. Feel free to call the clinic you have any questions or concerns. The clinic phone number is [phone].   I have been informed and understand all the instructions given to me. I know to contact the clinic, my physician, or go to the Emergency Department if any problems should occur. I do not have any questions at this time, but understand that I may call the clinic during office hours   should I have any questions or need assistance in obtaining follow up care.    __________________________________________  _____________  __________ Signature of Patient or Authorized Representative            Date                   Time    __________________________________________ Nurse's Signature

## 2015-09-14 NOTE — Telephone Encounter (Signed)
[facility]  MESSAGE TO DR Johnsie Cancel  FOR  REVIEW .Brian [name]

## 2015-09-14 NOTE — Telephone Encounter (Signed)
New Message  Pt called. Had an appt today 09/14/2015. He states that he forgot to ask if he should continue to take the low dose Asprin. Please call back to discuss.

## 2015-09-28 ENCOUNTER — Ambulatory Visit: Payer: Commercial Managed Care - PPO

## 2015-09-29 ENCOUNTER — Ambulatory Visit (HOSPITAL_BASED_OUTPATIENT_CLINIC_OR_DEPARTMENT_OTHER): Payer: Commercial Managed Care - PPO

## 2015-09-29 ENCOUNTER — Other Ambulatory Visit (HOSPITAL_BASED_OUTPATIENT_CLINIC_OR_DEPARTMENT_OTHER): Payer: Commercial Managed Care - PPO

## 2015-09-29 ENCOUNTER — Ambulatory Visit (HOSPITAL_BASED_OUTPATIENT_CLINIC_OR_DEPARTMENT_OTHER): Payer: Commercial Managed Care - PPO | Admitting: Hematology & Oncology

## 2015-09-29 VITALS — BP 122/78 | HR 67 | Temp 98.4°F | Wt 175.0 lb

## 2015-09-29 DIAGNOSIS — Z9484 Stem cells transplant status: Secondary | ICD-10-CM | POA: Diagnosis not present

## 2015-09-29 DIAGNOSIS — Z79899 Other long term (current) drug therapy: Secondary | ICD-10-CM | POA: Diagnosis not present

## 2015-09-29 DIAGNOSIS — C8195 Hodgkin lymphoma, unspecified, lymph nodes of inguinal region and lower limb: Secondary | ICD-10-CM

## 2015-09-29 DIAGNOSIS — R200 Anesthesia of skin: Secondary | ICD-10-CM

## 2015-09-29 DIAGNOSIS — C8190 Hodgkin lymphoma, unspecified, unspecified site: Secondary | ICD-10-CM

## 2015-09-29 DIAGNOSIS — Z5112 Encounter for antineoplastic immunotherapy: Secondary | ICD-10-CM

## 2015-09-29 LAB — CBC WITH DIFFERENTIAL (CANCER CENTER ONLY)
BASO#: 0 10*3/uL (ref 0.0–0.2)
BASO%: 0.6 % (ref 0.0–2.0)
EOS%: 2.6 % (ref 0.0–7.0)
Eosinophils Absolute: 0.1 10*3/uL (ref 0.0–0.5)
HEMATOCRIT: 38.5 % — AB (ref 38.7–49.9)
HEMOGLOBIN: 13.1 g/dL (ref 13.0–17.1)
LYMPH#: 0.8 10*3/uL — AB (ref 0.9–3.3)
LYMPH%: 15.5 % (ref 14.0–48.0)
MCH: 32.8 pg (ref 28.0–33.4)
MCHC: 34 g/dL (ref 32.0–35.9)
MCV: 96 fL (ref 82–98)
MONO#: 0.6 10*3/uL (ref 0.1–0.9)
MONO%: 12.4 % (ref 0.0–13.0)
NEUT%: 68.9 % (ref 40.0–80.0)
NEUTROS ABS: 3.4 10*3/uL (ref 1.5–6.5)
Platelets: 210 10*3/uL (ref 145–400)
RBC: 4 10*6/uL — AB (ref 4.20–5.70)
RDW: 12.6 % (ref 11.1–15.7)
WBC: 5 10*3/uL (ref 4.0–10.0)

## 2015-09-29 LAB — TSH CHCC: TSH: 2.285 m[IU]/L (ref 0.320–4.118)

## 2015-09-29 LAB — CMP (CANCER CENTER ONLY)
ALK PHOS: 68 U/L (ref 26–84)
ALT: 29 U/L (ref 10–47)
AST: 29 U/L (ref 11–38)
Albumin: 4 g/dL (ref 3.3–5.5)
BUN: 17 mg/dL (ref 7–22)
CO2: 24 mEq/L (ref 18–33)
CREATININE: 1.2 mg/dL (ref 0.6–1.2)
Calcium: 9.1 mg/dL (ref 8.0–10.3)
Chloride: 103 mEq/L (ref 98–108)
GLUCOSE: 103 mg/dL (ref 73–118)
POTASSIUM: 4.1 meq/L (ref 3.3–4.7)
SODIUM: 138 meq/L (ref 128–145)
TOTAL PROTEIN: 6.6 g/dL (ref 6.4–8.1)
Total Bilirubin: 0.8 mg/dl (ref 0.20–1.60)

## 2015-09-29 LAB — LACTATE DEHYDROGENASE (CC13): LDH: 173 U/L (ref 125–245)

## 2015-09-29 MED ORDER — SODIUM CHLORIDE 0.9 % IV SOLN
240.0000 mg | Freq: Once | INTRAVENOUS | Status: AC
  Administered 2015-09-29: 240 mg via INTRAVENOUS
  Filled 2015-09-29: qty 24

## 2015-09-29 MED ORDER — SODIUM CHLORIDE 0.9 % IJ SOLN
10.0000 mL | INTRAMUSCULAR | Status: DC | PRN
  Administered 2015-09-29: 10 mL
  Filled 2015-09-29: qty 10

## 2015-09-29 MED ORDER — HEPARIN SOD (PORK) LOCK FLUSH 100 UNIT/ML IV SOLN
500.0000 [IU] | Freq: Once | INTRAVENOUS | Status: AC | PRN
Start: 2015-09-29 — End: 2015-09-29
  Administered 2015-09-29: 500 [IU]
  Filled 2015-09-29: qty 5

## 2015-09-29 MED ORDER — SODIUM CHLORIDE 0.9 % IV SOLN
Freq: Once | INTRAVENOUS | Status: AC
  Administered 2015-09-29: 11:00:00 via INTRAVENOUS

## 2015-09-29 NOTE — Patient Instructions (Signed)
Nivolumab injection What is this medicine? NIVOLUMAB (nye VOL ue mab) is a monoclonal antibody. It is used to treat melanoma, lung cancer, kidney cancer, and Hodgkin lymphoma. This medicine may be used for other purposes; ask your health care provider or pharmacist if you have questions. What should I tell my health care provider before I take this medicine? They need to know if you have any of these conditions: -diabetes -immune system problems -kidney disease -liver disease -lung disease -organ transplant -stomach or intestine problems -thyroid disease -an unusual or allergic reaction to nivolumab, other medicines, foods, dyes, or preservatives -pregnant or trying to get pregnant -breast-feeding How should I use this medicine? This medicine is for infusion into a vein. It is given by a health care professional in a hospital or clinic setting. A special MedGuide will be given to you before each treatment. Be sure to read this information carefully each time. Talk to your pediatrician regarding the use of this medicine in children. Special care may be needed. Overdosage: If you think you have taken too much of this medicine contact a poison control center or emergency room at once. NOTE: This medicine is only for you. Do not share this medicine with others. What if I miss a dose? It is important not to miss your dose. Call your doctor or health care professional if you are unable to keep an appointment. What may interact with this medicine? Interactions have not been studied. Give your health care provider a list of all the medicines, herbs, non-prescription drugs, or dietary supplements you use. Also tell them if you smoke, drink alcohol, or use illegal drugs. Some items may interact with your medicine. This list may not describe all possible interactions. Give your health care provider a list of all the medicines, herbs, non-prescription drugs, or dietary supplements you use. Also tell  them if you smoke, drink alcohol, or use illegal drugs. Some items may interact with your medicine. What should I watch for while using this medicine? This drug may make you feel generally unwell. Continue your course of treatment even though you feel ill unless your doctor tells you to stop. You may need blood work done while you are taking this medicine. Do not become pregnant while taking this medicine or for 5 months after stopping it. Women should inform their doctor if they wish to become pregnant or think they might be pregnant. There is a potential for serious side effects to an unborn child. Talk to your health care professional or pharmacist for more information. Do not breast-feed an infant while taking this medicine. What side effects may I notice from receiving this medicine? Side effects that you should report to your doctor or health care professional as soon as possible: -allergic reactions like skin rash, itching or hives, swelling of the face, lips, or tongue -black, tarry stools -blood in the urine -bloody or watery diarrhea -changes in vision -change in sex drive -changes in emotions or moods -chest pain -confusion -cough -decreased appetite -diarrhea -facial flushing -feeling faint or lightheaded -fever, chills -hair loss -hallucination, loss of contact with reality -headache -irritable -joint pain -loss of memory -muscle pain -muscle weakness -seizures -shortness of breath -signs and symptoms of high blood sugar such as dizziness; dry mouth; dry skin; fruity breath; nausea; stomach pain; increased hunger or thirst; increased urination -signs and symptoms of kidney injury like trouble passing urine or change in the amount of urine -signs and symptoms of liver injury like dark yellow or   brown urine; general ill feeling or flu-like symptoms; light-colored stools; loss of appetite; nausea; right upper belly pain; unusually weak or tired; yellowing of the eyes or  skin -stiff neck -swelling of the ankles, feet, hands -weight gain Side effects that usually do not require medical attention (report to your doctor or health care professional if they continue or are bothersome): -bone pain -constipation -tiredness -vomiting This list may not describe all possible side effects. Call your doctor for medical advice about side effects. You may report side effects to FDA at 1-800-FDA-1088. Where should I keep my medicine? This drug is given in a hospital or clinic and will not be stored at home. NOTE: This sheet is a summary. It may not cover all possible information. If you have questions about this medicine, talk to your doctor, pharmacist, or health care provider.    2016, Elsevier/Gold Standard. (2015-04-13 10:03:42)  

## 2015-09-29 NOTE — Progress Notes (Signed)
Hematology and Oncology Follow Up Visit  Brian [name] [id number] Aug 20, 1961 54 y.o. 09/29/2015   Principle Diagnosis:  Recurrent Hodgkin's Disease -  S/p autologous transplant  TIA-resolved  Current Therapy:     Nivolumab s/p c#2     Interim History:  [name] is in for followup. He is doing quite well. He tolerated his 2nd cycle of Nivolumab without any problem. He does have a little bit of fatigue.  He still has some numbness over in the left foot. I think that  this was from the TIA that he had.  There is some numbness in his hands. This seems to be getting a little bit better.  He is still working without any difficulties. He is trying to exercise a little bit more.   He's had no problems with fever. He's had no rashes. He's had no change in bowel or bladder habits.  He's had no cough.  He says of the clinical trial that he will be part of for the CAR-T-cell protocol probably will not take him until February.  He's had no issues with fever. He's had no mass was. His appetite has been good.  Overall, his performance status is ECOG 1.   Medications:  Current outpatient prescriptions:  .  acetaminophen (TYLENOL) 325 MG tablet, Take 325 mg by mouth every 6 (six) hours as needed for mild pain., Disp: , Rfl:  .  aspirin 81 MG chewable tablet, Chew 1 tablet (81 mg total) by mouth daily., Disp: 30 tablet, Rfl: 0 .  Cholecalciferol (VITAMIN D-3) 1000 UNITS CAPS, Take 1 capsule by mouth daily. , Disp: , Rfl:  .  citalopram (CELEXA) 20 MG tablet, Take 1 tablet (20 mg total) by mouth daily., Disp: 30 tablet, Rfl: 6 .  FLUARIX QUADRIVALENT 0.5 ML injection, Inject as directed once., Disp: , Rfl: 0 .  lidocaine-prilocaine (EMLA) cream, Apply 1 application topically as needed. Apply nickel sized amount to portacath site at least 1 hour prior to chemotherapy., Disp: 30 g, Rfl: 1 .  LORazepam (ATIVAN) 1 MG tablet, TAKE 1/2 A TABLET BY MOUTH TWICE DAILY DURING THE DAY FOR ANXIETY, AND 1  TABLET AT BEDTIME IF NEEDED, Disp: 20 tablet, Rfl: 3 .  Multiple Vitamin (MULTIVITAMIN) tablet, Take 1 tablet by mouth daily., Disp: , Rfl:  .  ALPRAZolam (XANAX) 0.5 MG tablet, take 1 tablet by mouth at bedtime if needed for sleep, Disp: 30 tablet, Rfl: 2 No current facility-administered medications for this visit.  Facility-Administered Medications Ordered in Other Visits:  .  sodium chloride 0.9 % injection 10 mL, 10 mL, Intracatheter, PRN, [name], MD, 10 mL at 09/29/15 1258  Allergies: No Known Allergies  Past Medical History, Surgical history, Social history, and Family History were reviewed and updated.  Review of Systems: As above  Physical Exam:  weight is 175 lb (79.379 kg). His oral temperature is 98.4 F (36.9 C). His blood pressure is 122/78 and his pulse is 67.   Well-developed and well-nourished white gentleman. Head and neck exam shows no ocular or oral lesions. There are no palpable cervical or supraclavicular lymph nodes. Lungs are clear. Cardiac exam regular rate and rhythm with no murmurs, rubs or bruits.. Abdomen is soft. He has good bowel sounds. There is no fluid wave. There is no palpable liver or spleen tip. Back exam shows a thoracoscopy site in the left lateral chest wall. This is healed. There is no swelling or erythema. Extremities shows no clubbing, cyanosis or  edema. Skin exam shows no rashes, ecchymoses or petechia . Neurological exam is nonfocal.  Lab Results  Component Value Date   WBC 5.0 09/29/2015   HGB 13.1 09/29/2015   HCT 38.5* 09/29/2015   MCV 96 09/29/2015   PLT 210 09/29/2015     Chemistry      Component Value Date/Time   NA 138 09/29/2015 1023   NA 137 07/30/2015 0329   NA 142 03/25/2013 0828   K 4.1 09/29/2015 1023   K 5.1 07/30/2015 0329   K 4.1 03/25/2013 0828   CL 103 09/29/2015 1023   CL 103 07/30/2015 0329   CL 107 03/25/2013 0828   CO2 24 09/29/2015 1023   CO2 29 07/30/2015 0329   CO2 27 03/25/2013 0828   BUN 17  09/29/2015 1023   BUN 22* 07/30/2015 0329   BUN 12.7 03/25/2013 0828   CREATININE 1.2 09/29/2015 1023   CREATININE 1.33* 07/30/2015 0329   CREATININE 0.9 03/25/2013 0828      Component Value Date/Time   CALCIUM 9.1 09/29/2015 1023   CALCIUM 8.9 07/30/2015 0329   CALCIUM 9.2 03/25/2013 0828   ALKPHOS 68 09/29/2015 1023   ALKPHOS 50 07/30/2015 0329   ALKPHOS 122 03/25/2013 0828   AST 29 09/29/2015 1023   AST 33 07/30/2015 0329   AST 33 03/25/2013 0828   ALT 29 09/29/2015 1023   ALT 28 07/30/2015 0329   ALT 65* 03/25/2013 0828   BILITOT 0.80 09/29/2015 1023   BILITOT 0.6 07/30/2015 0329   BILITOT 0.31 03/25/2013 0828         Impression and Plan: [name] is a 54 year old gentleman.  He has history of recurrent Hodgkin's disease. 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 He then had a recurrence after this.  So far, he's tolerated the Nivolumab well.  We will continue to treat him every 2 weeks.  It sounds like he will not have the CAR-T cell therapy until February at the earliest.  I probably will repeat a PET scan on him sometime after Thanksgiving.  I will plan to see him back in 2 weeks.  I spent about 35 minutes with him today.     Cassell Smiles, MD 11/3/20161:17 PM

## 2015-10-12 ENCOUNTER — Other Ambulatory Visit (HOSPITAL_BASED_OUTPATIENT_CLINIC_OR_DEPARTMENT_OTHER): Payer: Commercial Managed Care - PPO

## 2015-10-12 ENCOUNTER — Ambulatory Visit (HOSPITAL_BASED_OUTPATIENT_CLINIC_OR_DEPARTMENT_OTHER): Payer: Commercial Managed Care - PPO | Admitting: Hematology & Oncology

## 2015-10-12 ENCOUNTER — Ambulatory Visit (HOSPITAL_BASED_OUTPATIENT_CLINIC_OR_DEPARTMENT_OTHER): Payer: Commercial Managed Care - PPO

## 2015-10-12 VITALS — BP 124/88 | HR 62 | Temp 98.5°F | Resp 16

## 2015-10-12 DIAGNOSIS — C8195 Hodgkin lymphoma, unspecified, lymph nodes of inguinal region and lower limb: Secondary | ICD-10-CM

## 2015-10-12 DIAGNOSIS — Z5112 Encounter for antineoplastic immunotherapy: Secondary | ICD-10-CM | POA: Diagnosis not present

## 2015-10-12 DIAGNOSIS — C8190 Hodgkin lymphoma, unspecified, unspecified site: Secondary | ICD-10-CM

## 2015-10-12 DIAGNOSIS — G629 Polyneuropathy, unspecified: Secondary | ICD-10-CM | POA: Diagnosis not present

## 2015-10-12 LAB — CBC WITH DIFFERENTIAL (CANCER CENTER ONLY)
BASO#: 0 10*3/uL (ref 0.0–0.2)
BASO%: 0.7 % (ref 0.0–2.0)
EOS%: 3.5 % (ref 0.0–7.0)
Eosinophils Absolute: 0.2 10*3/uL (ref 0.0–0.5)
HCT: 39.5 % (ref 38.7–49.9)
HGB: 13.3 g/dL (ref 13.0–17.1)
LYMPH#: 0.9 10*3/uL (ref 0.9–3.3)
LYMPH%: 21 % (ref 14.0–48.0)
MCH: 32.2 pg (ref 28.0–33.4)
MCHC: 33.7 g/dL (ref 32.0–35.9)
MCV: 96 fL (ref 82–98)
MONO#: 0.7 10*3/uL (ref 0.1–0.9)
MONO%: 16.4 % — ABNORMAL HIGH (ref 0.0–13.0)
NEUT#: 2.5 10*3/uL (ref 1.5–6.5)
NEUT%: 58.4 % (ref 40.0–80.0)
PLATELETS: 219 10*3/uL (ref 145–400)
RBC: 4.13 10*6/uL — ABNORMAL LOW (ref 4.20–5.70)
RDW: 12.8 % (ref 11.1–15.7)
WBC: 4.3 10*3/uL (ref 4.0–10.0)

## 2015-10-12 LAB — CMP (CANCER CENTER ONLY)
ALK PHOS: 74 U/L (ref 26–84)
ALT: 23 U/L (ref 10–47)
AST: 24 U/L (ref 11–38)
Albumin: 3.7 g/dL (ref 3.3–5.5)
BILIRUBIN TOTAL: 0.7 mg/dL (ref 0.20–1.60)
BUN: 20 mg/dL (ref 7–22)
CO2: 29 mEq/L (ref 18–33)
Calcium: 9.6 mg/dL (ref 8.0–10.3)
Chloride: 99 mEq/L (ref 98–108)
Creat: 1 mg/dl (ref 0.6–1.2)
GLUCOSE: 83 mg/dL (ref 73–118)
POTASSIUM: 4.2 meq/L (ref 3.3–4.7)
Sodium: 141 mEq/L (ref 128–145)
Total Protein: 6.9 g/dL (ref 6.4–8.1)

## 2015-10-12 MED ORDER — SODIUM CHLORIDE 0.9 % IJ SOLN
10.0000 mL | INTRAMUSCULAR | Status: DC | PRN
  Administered 2015-10-12: 10 mL
  Filled 2015-10-12: qty 10

## 2015-10-12 MED ORDER — SODIUM CHLORIDE 0.9 % IV SOLN
Freq: Once | INTRAVENOUS | Status: AC
  Administered 2015-10-12: 11:00:00 via INTRAVENOUS

## 2015-10-12 MED ORDER — HEPARIN SOD (PORK) LOCK FLUSH 100 UNIT/ML IV SOLN
500.0000 [IU] | Freq: Once | INTRAVENOUS | Status: AC | PRN
  Administered 2015-10-12: 500 [IU]
  Filled 2015-10-12: qty 5

## 2015-10-12 MED ORDER — SODIUM CHLORIDE 0.9 % IV SOLN
240.0000 mg | Freq: Once | INTRAVENOUS | Status: AC
  Administered 2015-10-12: 240 mg via INTRAVENOUS
  Filled 2015-10-12: qty 24

## 2015-10-12 NOTE — Progress Notes (Signed)
Hematology and Oncology Follow Up Visit  Brian [name] [id number] 08/16/1961 54 y.o. 10/12/2015   Principle Diagnosis:  Recurrent Hodgkin's Disease -  S/p autologous transplant  TIA-resolved  Current Therapy:     Nivolumab s/p c#3     Interim History:  Brian [name] is in for followup. He is doing quite well. He tolerated his 3rd cycle of Nivolumab without any problem. He does have a little bit of fatigue.  He still has some numbness over in the left foot. I think that  this was from the TIA that he had.  There is some numbness in his hands. This seems to be getting a little bit better.  I told him to try some vitamin B-12. I told her to try the over-the-counter sublingual version. This might help a little bit.  He is looking for 2 thanks giving. He and his son are going down to [place] to watch the [place] Thanksgiving football game   He is still working without any difficulties.  he does have some stress at work. He is trying to exercise a little bit more. this may help with the neuropathy.  He's had no problems with fever. He's had no rashes. He's had no change in bowel or bladder habits.  He's had no cough.  He says of the clinical trial that he will be part of for the CAR-T-cell protocol probably will not take him until February.  Overall, his performance status is ECOG 1.   Medications:  Current outpatient prescriptions:  .  acetaminophen (TYLENOL) 325 MG tablet, Take 325 mg by mouth every 6 (six) hours as needed for mild pain., Disp: , Rfl:  .  ALPRAZolam (XANAX) 0.5 MG tablet, take 1 tablet by mouth at bedtime if needed for sleep, Disp: 30 tablet, Rfl: 2 .  aspirin 81 MG chewable tablet, Chew 1 tablet (81 mg total) by mouth daily., Disp: 30 tablet, Rfl: 0 .  Cholecalciferol (VITAMIN D-3) 1000 UNITS CAPS, Take 1 capsule by mouth daily. , Disp: , Rfl:  .  citalopram (CELEXA) 20 MG tablet, Take 1 tablet (20 mg total) by mouth daily., Disp: 30 tablet, Rfl: 6 .  FLUARIX  QUADRIVALENT 0.5 ML injection, Inject as directed once., Disp: , Rfl: 0 .  lidocaine-prilocaine (EMLA) cream, Apply 1 application topically as needed. Apply nickel sized amount to portacath site at least 1 hour prior to chemotherapy., Disp: 30 g, Rfl: 1 .  LORazepam (ATIVAN) 1 MG tablet, TAKE 1/2 A TABLET BY MOUTH TWICE DAILY DURING THE DAY FOR ANXIETY, AND 1 TABLET AT BEDTIME IF NEEDED, Disp: 20 tablet, Rfl: 3 .  Multiple Vitamin (MULTIVITAMIN) tablet, Take 1 tablet by mouth daily., Disp: , Rfl:  No current facility-administered medications for this visit.  Facility-Administered Medications Ordered in Other Visits:  .  sodium chloride 0.9 % injection 10 mL, 10 mL, Intracatheter, PRN, [name], MD, 10 mL at 10/12/15 1409  Allergies: No Known Allergies  Past Medical History, Surgical history, Social history, and Family History were reviewed and updated.  Review of Systems: As above  Physical Exam:  vitals were not taken for this visit.  Well-developed and well-nourished white gentleman. Head and neck exam shows no ocular or oral lesions. There are no palpable cervical or supraclavicular lymph nodes. Lungs are clear. Cardiac exam regular rate and rhythm with no murmurs, rubs or bruits.. Abdomen is soft. He has good bowel sounds. There is no fluid wave. There is no palpable liver or spleen tip.  Back exam shows a thoracoscopy site in the left lateral chest wall. This is healed. There is no swelling or erythema. Extremities shows no clubbing, cyanosis or edema. Skin exam shows no rashes, ecchymoses or petechia . Neurological exam is nonfocal.  Lab Results  Component Value Date   WBC 4.3 10/12/2015   HGB 13.3 10/12/2015   HCT 39.5 10/12/2015   MCV 96 10/12/2015   PLT 219 10/12/2015     Chemistry      Component Value Date/Time   NA 141 10/12/2015 1053   NA 137 07/30/2015 0329   NA 142 03/25/2013 0828   K 4.2 10/12/2015 1053   K 5.1 07/30/2015 0329   K 4.1 03/25/2013 0828   CL 99  10/12/2015 1053   CL 103 07/30/2015 0329   CL 107 03/25/2013 0828   CO2 29 10/12/2015 1053   CO2 29 07/30/2015 0329   CO2 27 03/25/2013 0828   BUN 20 10/12/2015 1053   BUN 22* 07/30/2015 0329   BUN 12.7 03/25/2013 0828   CREATININE 1.0 10/12/2015 1053   CREATININE 1.33* 07/30/2015 0329   CREATININE 0.9 03/25/2013 0828      Component Value Date/Time   CALCIUM 9.6 10/12/2015 1053   CALCIUM 8.9 07/30/2015 0329   CALCIUM 9.2 03/25/2013 0828   ALKPHOS 74 10/12/2015 1053   ALKPHOS 50 07/30/2015 0329   ALKPHOS 122 03/25/2013 0828   AST 24 10/12/2015 1053   AST 33 07/30/2015 0329   AST 33 03/25/2013 0828   ALT 23 10/12/2015 1053   ALT 28 07/30/2015 0329   ALT 65* 03/25/2013 0828   BILITOT 0.70 10/12/2015 1053   BILITOT 0.6 07/30/2015 0329   BILITOT 0.31 03/25/2013 0828         Impression and Plan: Brian [name] is a 54 year old gentleman.  He has history of recurrent Hodgkin's disease. 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 He then had a recurrence after this.  So far, he's tolerated the Nivolumab well. 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 He also had that TIA which may have had some residual effect.  We will continue to treat him every 2 weeks.  It sounds like he will not have the CAR-T cell therapy until February at the earliest.  I probably will repeat a PET scan on him sometime r in December..  I will plan to see him back in 2 weeks.  I spent about 35 minutes with him today.     Cassell Smiles, MD 11/16/20163:44 PM

## 2015-10-12 NOTE — Patient Instructions (Signed)
Nivolumab injection What is this medicine? NIVOLUMAB (nye VOL ue mab) is a monoclonal antibody. It is used to treat melanoma, lung cancer, kidney cancer, and Hodgkin lymphoma. This medicine may be used for other purposes; ask your health care provider or pharmacist if you have questions. What should I tell my health care provider before I take this medicine? They need to know if you have any of these conditions: -diabetes -immune system problems -kidney disease -liver disease -lung disease -organ transplant -stomach or intestine problems -thyroid disease -an unusual or allergic reaction to nivolumab, other medicines, foods, dyes, or preservatives -pregnant or trying to get pregnant -breast-feeding How should I use this medicine? This medicine is for infusion into a vein. It is given by a health care professional in a hospital or clinic setting. A special MedGuide will be given to you before each treatment. Be sure to read this information carefully each time. Talk to your pediatrician regarding the use of this medicine in children. Special care may be needed. Overdosage: If you think you have taken too much of this medicine contact a poison control center or emergency room at once. NOTE: This medicine is only for you. Do not share this medicine with others. What if I miss a dose? It is important not to miss your dose. Call your doctor or health care professional if you are unable to keep an appointment. What may interact with this medicine? Interactions have not been studied. Give your health care provider a list of all the medicines, herbs, non-prescription drugs, or dietary supplements you use. Also tell them if you smoke, drink alcohol, or use illegal drugs. Some items may interact with your medicine. This list may not describe all possible interactions. Give your health care provider a list of all the medicines, herbs, non-prescription drugs, or dietary supplements you use. Also tell  them if you smoke, drink alcohol, or use illegal drugs. Some items may interact with your medicine. What should I watch for while using this medicine? This drug may make you feel generally unwell. Continue your course of treatment even though you feel ill unless your doctor tells you to stop. You may need blood work done while you are taking this medicine. Do not become pregnant while taking this medicine or for 5 months after stopping it. Women should inform their doctor if they wish to become pregnant or think they might be pregnant. There is a potential for serious side effects to an unborn child. Talk to your health care professional or pharmacist for more information. Do not breast-feed an infant while taking this medicine. What side effects may I notice from receiving this medicine? Side effects that you should report to your doctor or health care professional as soon as possible: -allergic reactions like skin rash, itching or hives, swelling of the face, lips, or tongue -black, tarry stools -blood in the urine -bloody or watery diarrhea -changes in vision -change in sex drive -changes in emotions or moods -chest pain -confusion -cough -decreased appetite -diarrhea -facial flushing -feeling faint or lightheaded -fever, chills -hair loss -hallucination, loss of contact with reality -headache -irritable -joint pain -loss of memory -muscle pain -muscle weakness -seizures -shortness of breath -signs and symptoms of high blood sugar such as dizziness; dry mouth; dry skin; fruity breath; nausea; stomach pain; increased hunger or thirst; increased urination -signs and symptoms of kidney injury like trouble passing urine or change in the amount of urine -signs and symptoms of liver injury like dark yellow or   brown urine; general ill feeling or flu-like symptoms; light-colored stools; loss of appetite; nausea; right upper belly pain; unusually weak or tired; yellowing of the eyes or  skin -stiff neck -swelling of the ankles, feet, hands -weight gain Side effects that usually do not require medical attention (report to your doctor or health care professional if they continue or are bothersome): -bone pain -constipation -tiredness -vomiting This list may not describe all possible side effects. Call your doctor for medical advice about side effects. You may report side effects to FDA at 1-800-FDA-1088. Where should I keep my medicine? This drug is given in a hospital or clinic and will not be stored at home. NOTE: This sheet is a summary. It may not cover all possible information. If you have questions about this medicine, talk to your doctor, pharmacist, or health care provider.    2016, Elsevier/Gold Standard. (2015-04-13 10:03:42)  

## 2015-10-26 ENCOUNTER — Encounter: Payer: Self-pay | Admitting: Hematology & Oncology

## 2015-10-26 ENCOUNTER — Ambulatory Visit (HOSPITAL_BASED_OUTPATIENT_CLINIC_OR_DEPARTMENT_OTHER): Payer: Commercial Managed Care - PPO | Admitting: Hematology & Oncology

## 2015-10-26 ENCOUNTER — Ambulatory Visit (HOSPITAL_BASED_OUTPATIENT_CLINIC_OR_DEPARTMENT_OTHER): Payer: Commercial Managed Care - PPO

## 2015-10-26 ENCOUNTER — Other Ambulatory Visit (HOSPITAL_BASED_OUTPATIENT_CLINIC_OR_DEPARTMENT_OTHER): Payer: Commercial Managed Care - PPO

## 2015-10-26 VITALS — BP 121/84 | HR 78 | Temp 98.0°F | Resp 18 | Wt 173.0 lb

## 2015-10-26 DIAGNOSIS — G620 Drug-induced polyneuropathy: Secondary | ICD-10-CM | POA: Diagnosis not present

## 2015-10-26 DIAGNOSIS — C8195 Hodgkin lymphoma, unspecified, lymph nodes of inguinal region and lower limb: Secondary | ICD-10-CM

## 2015-10-26 DIAGNOSIS — Z5112 Encounter for antineoplastic immunotherapy: Secondary | ICD-10-CM | POA: Diagnosis not present

## 2015-10-26 LAB — CBC WITH DIFFERENTIAL (CANCER CENTER ONLY)
BASO#: 0 10*3/uL (ref 0.0–0.2)
BASO%: 0.6 % (ref 0.0–2.0)
EOS ABS: 0.2 10*3/uL (ref 0.0–0.5)
EOS%: 4.4 % (ref 0.0–7.0)
HEMATOCRIT: 40.4 % (ref 38.7–49.9)
HEMOGLOBIN: 13.8 g/dL (ref 13.0–17.1)
LYMPH#: 0.9 10*3/uL (ref 0.9–3.3)
LYMPH%: 19.4 % (ref 14.0–48.0)
MCH: 32.7 pg (ref 28.0–33.4)
MCHC: 34.2 g/dL (ref 32.0–35.9)
MCV: 96 fL (ref 82–98)
MONO#: 0.7 10*3/uL (ref 0.1–0.9)
MONO%: 14.8 % — ABNORMAL HIGH (ref 0.0–13.0)
NEUT%: 60.8 % (ref 40.0–80.0)
NEUTROS ABS: 2.9 10*3/uL (ref 1.5–6.5)
Platelets: 210 10*3/uL (ref 145–400)
RBC: 4.22 10*6/uL (ref 4.20–5.70)
RDW: 12.7 % (ref 11.1–15.7)
WBC: 4.7 10*3/uL (ref 4.0–10.0)

## 2015-10-26 LAB — CMP (CANCER CENTER ONLY)
ALBUMIN: 3.9 g/dL (ref 3.3–5.5)
ALT(SGPT): 21 U/L (ref 10–47)
AST: 26 U/L (ref 11–38)
Alkaline Phosphatase: 59 U/L (ref 26–84)
BILIRUBIN TOTAL: 0.8 mg/dL (ref 0.20–1.60)
BUN, Bld: 18 mg/dL (ref 7–22)
CALCIUM: 9.5 mg/dL (ref 8.0–10.3)
CHLORIDE: 100 meq/L (ref 98–108)
CO2: 27 mEq/L (ref 18–33)
CREATININE: 1 mg/dL (ref 0.6–1.2)
Glucose, Bld: 89 mg/dL (ref 73–118)
Potassium: 4.2 mEq/L (ref 3.3–4.7)
SODIUM: 137 meq/L (ref 128–145)
TOTAL PROTEIN: 6.9 g/dL (ref 6.4–8.1)

## 2015-10-26 LAB — LACTATE DEHYDROGENASE (CC13): LDH: 201 U/L (ref 125–245)

## 2015-10-26 MED ORDER — SODIUM CHLORIDE 0.9 % IJ SOLN
10.0000 mL | INTRAMUSCULAR | Status: DC | PRN
  Administered 2015-10-26: 10 mL
  Filled 2015-10-26: qty 10

## 2015-10-26 MED ORDER — SODIUM CHLORIDE 0.9 % IV SOLN
Freq: Once | INTRAVENOUS | Status: AC
  Administered 2015-10-26: 16:00:00 via INTRAVENOUS

## 2015-10-26 MED ORDER — SODIUM CHLORIDE 0.9 % IV SOLN
240.0000 mg | Freq: Once | INTRAVENOUS | Status: AC
  Administered 2015-10-26: 240 mg via INTRAVENOUS
  Filled 2015-10-26: qty 20

## 2015-10-26 MED ORDER — HEPARIN SOD (PORK) LOCK FLUSH 100 UNIT/ML IV SOLN
500.0000 [IU] | Freq: Once | INTRAVENOUS | Status: AC | PRN
  Administered 2015-10-26: 500 [IU]
  Filled 2015-10-26: qty 5

## 2015-10-26 NOTE — Progress Notes (Signed)
Hematology and Oncology Follow Up Visit  Brian [name] [id number] 04/22/1961 54 y.o. 10/26/2015   Principle Diagnosis:  Recurrent Hodgkin's Disease -  S/p autologous transplant  TIA-resolved  Current Therapy:     Nivolumab s/p c#3     Interim History:  [name] is in for followup. He is doing quite well.he had a very good time down [place] over Thanksgiving. He and one of his sons went down to [place]. They went to see the [place] vs [employer] football game.   He did get a little bit of a upper respiratory infection when he came back. He is a little bit congested. He's had no fever. He's had no productive sputum. As such, I don't he needs any antibiotics.  He still has some occasional weakness with the left foot. This may have happened with the TIA that he had.  I told her to try some vitamin B-12. I told him to try 5000 g daily.   There are still a lot of personal issues that he is dealing with. This is putting a lot of stress on him.  He says that [facility] called him and told him that it looks like the CAR -T cell protocol probably will be available for him in late January.  He is due for a PET scan next week.   Overall, his performance status is ECOG 1.   Medications:  Current outpatient prescriptions:  .  acetaminophen (TYLENOL) 325 MG tablet, Take 325 mg by mouth every 6 (six) hours as needed for mild pain., Disp: , Rfl:  .  ALPRAZolam (XANAX) 0.5 MG tablet, take 1 tablet by mouth at bedtime if needed for sleep, Disp: 30 tablet, Rfl: 2 .  aspirin 81 MG chewable tablet, Chew 1 tablet (81 mg total) by mouth daily., Disp: 30 tablet, Rfl: 0 .  Cholecalciferol (VITAMIN D-3) 1000 UNITS CAPS, Take 1 capsule by mouth daily. , Disp: , Rfl:  .  citalopram (CELEXA) 20 MG tablet, Take 1 tablet (20 mg total) by mouth daily., Disp: 30 tablet, Rfl: 6 .  FLUARIX QUADRIVALENT 0.5 ML injection, Inject as directed once., Disp: , Rfl: 0 .  lidocaine-prilocaine (EMLA) cream, Apply 1  application topically as needed. Apply nickel sized amount to portacath site at least 1 hour prior to chemotherapy., Disp: 30 g, Rfl: 1 .  LORazepam (ATIVAN) 1 MG tablet, TAKE 1/2 A TABLET BY MOUTH TWICE DAILY DURING THE DAY FOR ANXIETY, AND 1 TABLET AT BEDTIME IF NEEDED, Disp: 20 tablet, Rfl: 3 .  Multiple Vitamin (MULTIVITAMIN) tablet, Take 1 tablet by mouth daily., Disp: , Rfl:   Allergies: No Known Allergies  Past Medical History, Surgical history, Social history, and Family History were reviewed and updated.  Review of Systems: As above  Physical Exam:  weight is 173 lb (78.472 kg). His oral temperature is 98 F (36.7 C). His blood pressure is 121/84 and his pulse is 78. His respiration is 18.   Well-developed and well-nourished white gentleman. Head and neck exam shows no ocular or oral lesions. There are no palpable cervical or supraclavicular lymph nodes. Lungs are clear. Cardiac exam regular rate and rhythm with no murmurs, rubs or bruits.. Abdomen is soft. He has good bowel sounds. There is no fluid wave. There is no palpable liver or spleen tip. Back exam shows a thoracoscopy site in the left lateral chest wall. This is healed. There is no swelling or erythema. Extremities shows no clubbing, cyanosis or edema. Skin exam shows  no rashes, ecchymoses or petechia . Neurological exam is nonfocal.  Lab Results  Component Value Date   WBC 4.7 10/26/2015   HGB 13.8 10/26/2015   HCT 40.4 10/26/2015   MCV 96 10/26/2015   PLT 210 10/26/2015     Chemistry      Component Value Date/Time   NA 137 10/26/2015 1145   NA 137 07/30/2015 0329   NA 142 03/25/2013 0828   K 4.2 10/26/2015 1145   K 5.1 07/30/2015 0329   K 4.1 03/25/2013 0828   CL 100 10/26/2015 1145   CL 103 07/30/2015 0329   CL 107 03/25/2013 0828   CO2 27 10/26/2015 1145   CO2 29 07/30/2015 0329   CO2 27 03/25/2013 0828   BUN 18 10/26/2015 1145   BUN 22* 07/30/2015 0329   BUN 12.7 03/25/2013 0828   CREATININE 1.0  10/26/2015 1145   CREATININE 1.33* 07/30/2015 0329   CREATININE 0.9 03/25/2013 0828      Component Value Date/Time   CALCIUM 9.5 10/26/2015 1145   CALCIUM 8.9 07/30/2015 0329   CALCIUM 9.2 03/25/2013 0828   ALKPHOS 59 10/26/2015 1145   ALKPHOS 50 07/30/2015 0329   ALKPHOS 122 03/25/2013 0828   AST 26 10/26/2015 1145   AST 33 07/30/2015 0329   AST 33 03/25/2013 0828   ALT 21 10/26/2015 1145   ALT 28 07/30/2015 0329   ALT 65* 03/25/2013 0828   BILITOT 0.80 10/26/2015 1145   BILITOT 0.6 07/30/2015 0329   BILITOT 0.31 03/25/2013 0828         Impression and Plan: Brian [name] is a 54 year old gentleman.  He has history of recurrent Hodgkin's disease. 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 He then had a recurrence after this.  So far, he's tolerated the Nivolumab well. 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 He also had that TIA which may have had some residual effect.  We will continue to treat him every 2 weeks.  His PET scan will be next week.  Hopefully, he will be able to get into the protocol at [facility] in January.  I told him that I don't see him every time he gets treated. I think that every other treatment appointment we can see him.  I spent about 35 minutes with him today.     Cassell Smiles, MD 11/30/20161:03 PM

## 2015-10-26 NOTE — Patient Instructions (Signed)
Nivolumab injection What is this medicine? NIVOLUMAB (nye VOL ue mab) is a monoclonal antibody. It is used to treat melanoma, lung cancer, kidney cancer, and Hodgkin lymphoma. This medicine may be used for other purposes; ask your health care provider or pharmacist if you have questions. What should I tell my health care provider before I take this medicine? They need to know if you have any of these conditions: -diabetes -immune system problems -kidney disease -liver disease -lung disease -organ transplant -stomach or intestine problems -thyroid disease -an unusual or allergic reaction to nivolumab, other medicines, foods, dyes, or preservatives -pregnant or trying to get pregnant -breast-feeding How should I use this medicine? This medicine is for infusion into a vein. It is given by a health care professional in a hospital or clinic setting. A special MedGuide will be given to you before each treatment. Be sure to read this information carefully each time. Talk to your pediatrician regarding the use of this medicine in children. Special care may be needed. Overdosage: If you think you have taken too much of this medicine contact a poison control center or emergency room at once. NOTE: This medicine is only for you. Do not share this medicine with others. What if I miss a dose? It is important not to miss your dose. Call your doctor or health care professional if you are unable to keep an appointment. What may interact with this medicine? Interactions have not been studied. Give your health care provider a list of all the medicines, herbs, non-prescription drugs, or dietary supplements you use. Also tell them if you smoke, drink alcohol, or use illegal drugs. Some items may interact with your medicine. This list may not describe all possible interactions. Give your health care provider a list of all the medicines, herbs, non-prescription drugs, or dietary supplements you use. Also tell  them if you smoke, drink alcohol, or use illegal drugs. Some items may interact with your medicine. What should I watch for while using this medicine? This drug may make you feel generally unwell. Continue your course of treatment even though you feel ill unless your doctor tells you to stop. You may need blood work done while you are taking this medicine. Do not become pregnant while taking this medicine or for 5 months after stopping it. Women should inform their doctor if they wish to become pregnant or think they might be pregnant. There is a potential for serious side effects to an unborn child. Talk to your health care professional or pharmacist for more information. Do not breast-feed an infant while taking this medicine. What side effects may I notice from receiving this medicine? Side effects that you should report to your doctor or health care professional as soon as possible: -allergic reactions like skin rash, itching or hives, swelling of the face, lips, or tongue -black, tarry stools -blood in the urine -bloody or watery diarrhea -changes in vision -change in sex drive -changes in emotions or moods -chest pain -confusion -cough -decreased appetite -diarrhea -facial flushing -feeling faint or lightheaded -fever, chills -hair loss -hallucination, loss of contact with reality -headache -irritable -joint pain -loss of memory -muscle pain -muscle weakness -seizures -shortness of breath -signs and symptoms of high blood sugar such as dizziness; dry mouth; dry skin; fruity breath; nausea; stomach pain; increased hunger or thirst; increased urination -signs and symptoms of kidney injury like trouble passing urine or change in the amount of urine -signs and symptoms of liver injury like dark yellow or   brown urine; general ill feeling or flu-like symptoms; light-colored stools; loss of appetite; nausea; right upper belly pain; unusually weak or tired; yellowing of the eyes or  skin -stiff neck -swelling of the ankles, feet, hands -weight gain Side effects that usually do not require medical attention (report to your doctor or health care professional if they continue or are bothersome): -bone pain -constipation -tiredness -vomiting This list may not describe all possible side effects. Call your doctor for medical advice about side effects. You may report side effects to FDA at 1-800-FDA-1088. Where should I keep my medicine? This drug is given in a hospital or clinic and will not be stored at home. NOTE: This sheet is a summary. It may not cover all possible information. If you have questions about this medicine, talk to your doctor, pharmacist, or health care provider.    2016, Elsevier/Gold Standard. (2015-04-13 10:03:42)  

## 2015-10-26 NOTE — Addendum Note (Signed)
Addended by: Jaci Carrel A on: 10/26/2015 04:36 PM   Modules accepted: Medications

## 2015-11-02 ENCOUNTER — Encounter (HOSPITAL_COMMUNITY)
Admission: RE | Admit: 2015-11-02 | Discharge: 2015-11-02 | Disposition: A | Discharge status: Home / Self Care | Payer: Commercial Managed Care - PPO | Source: Ambulatory Visit | Attending: Hematology & Oncology | Admitting: Hematology & Oncology

## 2015-11-02 DIAGNOSIS — C8195 Hodgkin lymphoma, unspecified, lymph nodes of inguinal region and lower limb: Secondary | ICD-10-CM | POA: Diagnosis present

## 2015-11-02 LAB — GLUCOSE, CAPILLARY: GLUCOSE-CAPILLARY: 100 mg/dL — AB (ref 65–99)

## 2015-11-02 MED ORDER — FLUDEOXYGLUCOSE F - 18 (FDG) INJECTION
9.5700 | Freq: Once | INTRAVENOUS | Status: AC | PRN
  Administered 2015-11-02: 9.57 via INTRAVENOUS

## 2015-11-08 ENCOUNTER — Encounter: Payer: Self-pay | Admitting: Hematology & Oncology

## 2015-11-09 ENCOUNTER — Ambulatory Visit (HOSPITAL_BASED_OUTPATIENT_CLINIC_OR_DEPARTMENT_OTHER): Payer: Commercial Managed Care - PPO | Admitting: Hematology & Oncology

## 2015-11-09 ENCOUNTER — Ambulatory Visit: Payer: Commercial Managed Care - PPO

## 2015-11-09 ENCOUNTER — Other Ambulatory Visit: Payer: Commercial Managed Care - PPO

## 2015-11-09 ENCOUNTER — Ambulatory Visit (HOSPITAL_BASED_OUTPATIENT_CLINIC_OR_DEPARTMENT_OTHER): Payer: Commercial Managed Care - PPO

## 2015-11-09 ENCOUNTER — Encounter: Payer: Self-pay | Admitting: Hematology & Oncology

## 2015-11-09 ENCOUNTER — Other Ambulatory Visit (HOSPITAL_BASED_OUTPATIENT_CLINIC_OR_DEPARTMENT_OTHER): Payer: Commercial Managed Care - PPO

## 2015-11-09 VITALS — BP 116/77 | HR 75 | Temp 98.1°F | Resp 16 | Ht 70.0 in | Wt 176.0 lb

## 2015-11-09 DIAGNOSIS — Z9484 Stem cells transplant status: Secondary | ICD-10-CM | POA: Diagnosis not present

## 2015-11-09 DIAGNOSIS — Z5112 Encounter for antineoplastic immunotherapy: Secondary | ICD-10-CM | POA: Diagnosis not present

## 2015-11-09 DIAGNOSIS — G609 Hereditary and idiopathic neuropathy, unspecified: Secondary | ICD-10-CM | POA: Diagnosis not present

## 2015-11-09 DIAGNOSIS — C8195 Hodgkin lymphoma, unspecified, lymph nodes of inguinal region and lower limb: Secondary | ICD-10-CM

## 2015-11-09 LAB — CBC WITH DIFFERENTIAL (CANCER CENTER ONLY)
BASO#: 0 10*3/uL (ref 0.0–0.2)
BASO%: 0.8 % (ref 0.0–2.0)
EOS%: 3.5 % (ref 0.0–7.0)
Eosinophils Absolute: 0.2 10*3/uL (ref 0.0–0.5)
HCT: 41.5 % (ref 38.7–49.9)
HGB: 14.1 g/dL (ref 13.0–17.1)
LYMPH#: 0.9 10*3/uL (ref 0.9–3.3)
LYMPH%: 18 % (ref 14.0–48.0)
MCH: 32.3 pg (ref 28.0–33.4)
MCHC: 34 g/dL (ref 32.0–35.9)
MCV: 95 fL (ref 82–98)
MONO#: 0.7 10*3/uL (ref 0.1–0.9)
MONO%: 15.2 % — AB (ref 0.0–13.0)
NEUT#: 3.1 10*3/uL (ref 1.5–6.5)
NEUT%: 62.5 % (ref 40.0–80.0)
PLATELETS: 215 10*3/uL (ref 145–400)
RBC: 4.37 10*6/uL (ref 4.20–5.70)
RDW: 12.7 % (ref 11.1–15.7)
WBC: 4.9 10*3/uL (ref 4.0–10.0)

## 2015-11-09 MED ORDER — SODIUM CHLORIDE 0.9 % IJ SOLN
10.0000 mL | INTRAMUSCULAR | Status: DC | PRN
  Administered 2015-11-09: 10 mL
  Filled 2015-11-09: qty 10

## 2015-11-09 MED ORDER — SODIUM CHLORIDE 0.9 % IV SOLN
240.0000 mg | Freq: Once | INTRAVENOUS | Status: AC
  Administered 2015-11-09: 240 mg via INTRAVENOUS
  Filled 2015-11-09: qty 24

## 2015-11-09 MED ORDER — HEPARIN SOD (PORK) LOCK FLUSH 100 UNIT/ML IV SOLN
500.0000 [IU] | Freq: Once | INTRAVENOUS | Status: AC | PRN
  Administered 2015-11-09: 500 [IU]
  Filled 2015-11-09: qty 5

## 2015-11-09 MED ORDER — SODIUM CHLORIDE 0.9 % IV SOLN
Freq: Once | INTRAVENOUS | Status: AC
  Administered 2015-11-09: 09:00:00 via INTRAVENOUS

## 2015-11-09 NOTE — Patient Instructions (Signed)
Nivolumab injection What is this medicine? NIVOLUMAB (nye VOL ue mab) is a monoclonal antibody. It is used to treat melanoma, lung cancer, kidney cancer, and Hodgkin lymphoma. This medicine may be used for other purposes; ask your health care provider or pharmacist if you have questions. What should I tell my health care provider before I take this medicine? They need to know if you have any of these conditions: -diabetes -immune system problems -kidney disease -liver disease -lung disease -organ transplant -stomach or intestine problems -thyroid disease -an unusual or allergic reaction to nivolumab, other medicines, foods, dyes, or preservatives -pregnant or trying to get pregnant -breast-feeding How should I use this medicine? This medicine is for infusion into a vein. It is given by a health care professional in a hospital or clinic setting. A special MedGuide will be given to you before each treatment. Be sure to read this information carefully each time. Talk to your pediatrician regarding the use of this medicine in children. Special care may be needed. Overdosage: If you think you have taken too much of this medicine contact a poison control center or emergency room at once. NOTE: This medicine is only for you. Do not share this medicine with others. What if I miss a dose? It is important not to miss your dose. Call your doctor or health care professional if you are unable to keep an appointment. What may interact with this medicine? Interactions have not been studied. Give your health care provider a list of all the medicines, herbs, non-prescription drugs, or dietary supplements you use. Also tell them if you smoke, drink alcohol, or use illegal drugs. Some items may interact with your medicine. This list may not describe all possible interactions. Give your health care provider a list of all the medicines, herbs, non-prescription drugs, or dietary supplements you use. Also tell  them if you smoke, drink alcohol, or use illegal drugs. Some items may interact with your medicine. What should I watch for while using this medicine? This drug may make you feel generally unwell. Continue your course of treatment even though you feel ill unless your doctor tells you to stop. You may need blood work done while you are taking this medicine. Do not become pregnant while taking this medicine or for 5 months after stopping it. Women should inform their doctor if they wish to become pregnant or think they might be pregnant. There is a potential for serious side effects to an unborn child. Talk to your health care professional or pharmacist for more information. Do not breast-feed an infant while taking this medicine. What side effects may I notice from receiving this medicine? Side effects that you should report to your doctor or health care professional as soon as possible: -allergic reactions like skin rash, itching or hives, swelling of the face, lips, or tongue -black, tarry stools -blood in the urine -bloody or watery diarrhea -changes in vision -change in sex drive -changes in emotions or moods -chest pain -confusion -cough -decreased appetite -diarrhea -facial flushing -feeling faint or lightheaded -fever, chills -hair loss -hallucination, loss of contact with reality -headache -irritable -joint pain -loss of memory -muscle pain -muscle weakness -seizures -shortness of breath -signs and symptoms of high blood sugar such as dizziness; dry mouth; dry skin; fruity breath; nausea; stomach pain; increased hunger or thirst; increased urination -signs and symptoms of kidney injury like trouble passing urine or change in the amount of urine -signs and symptoms of liver injury like dark yellow or   brown urine; general ill feeling or flu-like symptoms; light-colored stools; loss of appetite; nausea; right upper belly pain; unusually weak or tired; yellowing of the eyes or  skin -stiff neck -swelling of the ankles, feet, hands -weight gain Side effects that usually do not require medical attention (report to your doctor or health care professional if they continue or are bothersome): -bone pain -constipation -tiredness -vomiting This list may not describe all possible side effects. Call your doctor for medical advice about side effects. You may report side effects to FDA at 1-800-FDA-1088. Where should I keep my medicine? This drug is given in a hospital or clinic and will not be stored at home. NOTE: This sheet is a summary. It may not cover all possible information. If you have questions about this medicine, talk to your doctor, pharmacist, or health care provider.    2016, Elsevier/Gold Standard. (2015-04-13 10:03:42)  

## 2015-11-09 NOTE — Progress Notes (Signed)
Hematology and Oncology Follow Up Visit  Brian [name] [id number] 12/27/1960 54 y.o. 11/09/2015   Principle Diagnosis:  Recurrent Hodgkin's Disease -  S/p autologous transplant  TIA-resolved  Current Therapy:     Nivolumab s/p c#3     Interim History:  [name] is in for followup. He is doing pretty well. He had a PET scan recently. The PET scan did not show any evidence of Hodgkin's progression. He has some changes on a PET scan which were not relevant to his Hodgkin's disease. I talked to him about all this.  He's working. He has had no problems with work.  He still has not gone in the gym. I really told that if he went to the gym, this may help with the neuropathy and the weakness in the left foot.  He's not had any cough or shortness of breath. He's had no change in bowel or bladder habits. Appetite has been good. He's had no fever. He's had no rashes.  Overall, his performance status is ECOG 1.   Medications:  Current outpatient prescriptions:  .  acetaminophen (TYLENOL) 325 MG tablet, Take 325 mg by mouth every 6 (six) hours as needed for mild pain., Disp: , Rfl:  .  ALPRAZolam (XANAX) 0.5 MG tablet, take 1 tablet by mouth at bedtime if needed for sleep, Disp: 30 tablet, Rfl: 2 .  aspirin 81 MG chewable tablet, Chew 1 tablet (81 mg total) by mouth daily., Disp: 30 tablet, Rfl: 0 .  Cholecalciferol (VITAMIN D-3) 1000 UNITS CAPS, Take 1 capsule by mouth daily. , Disp: , Rfl:  .  citalopram (CELEXA) 20 MG tablet, Take 1 tablet (20 mg total) by mouth daily., Disp: 30 tablet, Rfl: 6 .  lidocaine-prilocaine (EMLA) cream, Apply 1 application topically as needed. Apply nickel sized amount to portacath site at least 1 hour prior to chemotherapy., Disp: 30 g, Rfl: 1 .  LORazepam (ATIVAN) 1 MG tablet, TAKE 1/2 A TABLET BY MOUTH TWICE DAILY DURING THE DAY FOR ANXIETY, AND 1 TABLET AT BEDTIME IF NEEDED, Disp: 20 tablet, Rfl: 3 .  Multiple Vitamin (MULTIVITAMIN) tablet, Take 1 tablet by  mouth daily., Disp: , Rfl:   Allergies: No Known Allergies  Past Medical History, Surgical history, Social history, and Family History were reviewed and updated.  Review of Systems: As above  Physical Exam:  height is 5\' 10"  (1.778 m) and weight is 176 lb (79.833 kg). His oral temperature is 98.1 F (36.7 C). His blood pressure is 116/77 and his pulse is 75. His respiration is 16.   Well-developed and well-nourished white gentleman. Head and neck exam shows no ocular or oral lesions. There are no palpable cervical or supraclavicular lymph nodes. Lungs are clear. Cardiac exam regular rate and rhythm with no murmurs, rubs or bruits.. Abdomen is soft. He has good bowel sounds. There is no fluid wave. There is no palpable liver or spleen tip. Back exam shows a thoracoscopy site in the left lateral chest wall. This is healed. There is no swelling or erythema. Extremities shows no clubbing, cyanosis or edema. Skin exam shows no rashes, ecchymoses or petechia . Neurological exam is nonfocal.  Lab Results  Component Value Date   WBC 4.9 11/09/2015   HGB 14.1 11/09/2015   HCT 41.5 11/09/2015   MCV 95 11/09/2015   PLT 215 11/09/2015     Chemistry      Component Value Date/Time   NA 137 10/26/2015 1145   NA 137 07/30/2015  0329   NA 142 03/25/2013 0828   K 4.2 10/26/2015 1145   K 5.1 07/30/2015 0329   K 4.1 03/25/2013 0828   CL 100 10/26/2015 1145   CL 103 07/30/2015 0329   CL 107 03/25/2013 0828   CO2 27 10/26/2015 1145   CO2 29 07/30/2015 0329   CO2 27 03/25/2013 0828   BUN 18 10/26/2015 1145   BUN 22* 07/30/2015 0329   BUN 12.7 03/25/2013 0828   CREATININE 1.0 10/26/2015 1145   CREATININE 1.33* 07/30/2015 0329   CREATININE 0.9 03/25/2013 0828      Component Value Date/Time   CALCIUM 9.5 10/26/2015 1145   CALCIUM 8.9 07/30/2015 0329   CALCIUM 9.2 03/25/2013 0828   ALKPHOS 59 10/26/2015 1145   ALKPHOS 50 07/30/2015 0329   ALKPHOS 122 03/25/2013 0828   AST 26 10/26/2015 1145     AST 33 07/30/2015 0329   AST 33 03/25/2013 0828   ALT 21 10/26/2015 1145   ALT 28 07/30/2015 0329   ALT 65* 03/25/2013 0828   BILITOT 0.80 10/26/2015 1145   BILITOT 0.6 07/30/2015 0329   BILITOT 0.31 03/25/2013 0828         Impression and Plan: [name] is a 54 year old gentleman.  He has history of recurrent Hodgkin's disease. 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 He then had a recurrence after this.  So far, he's tolerated the Nivolumab well. 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 He also had that TIA which may have had some residual effect.  With the holidays coming up, we will schedule him for another treatment until after New Year's.  He is supposed to have the protocol therapy at [facility] in mid to late January. He needs a three-week "cushion" prior to any protocol therapy.  I will plan to see him back in January.   I spent about 35 minutes with him today.     Cassell Smiles, MD 12/14/20168:51 AM

## 2015-11-30 ENCOUNTER — Encounter: Payer: Self-pay | Admitting: Hematology & Oncology

## 2015-11-30 ENCOUNTER — Ambulatory Visit (HOSPITAL_BASED_OUTPATIENT_CLINIC_OR_DEPARTMENT_OTHER): Payer: Commercial Managed Care - PPO | Admitting: Hematology & Oncology

## 2015-11-30 ENCOUNTER — Other Ambulatory Visit (HOSPITAL_BASED_OUTPATIENT_CLINIC_OR_DEPARTMENT_OTHER): Payer: Commercial Managed Care - PPO

## 2015-11-30 ENCOUNTER — Ambulatory Visit (HOSPITAL_BASED_OUTPATIENT_CLINIC_OR_DEPARTMENT_OTHER): Payer: Commercial Managed Care - PPO

## 2015-11-30 VITALS — BP 137/74 | HR 74 | Temp 98.0°F | Resp 16 | Ht 70.0 in | Wt 176.0 lb

## 2015-11-30 DIAGNOSIS — G609 Hereditary and idiopathic neuropathy, unspecified: Secondary | ICD-10-CM | POA: Diagnosis not present

## 2015-11-30 DIAGNOSIS — Z5112 Encounter for antineoplastic immunotherapy: Secondary | ICD-10-CM | POA: Diagnosis not present

## 2015-11-30 DIAGNOSIS — C8195 Hodgkin lymphoma, unspecified, lymph nodes of inguinal region and lower limb: Secondary | ICD-10-CM

## 2015-11-30 DIAGNOSIS — Z9484 Stem cells transplant status: Secondary | ICD-10-CM | POA: Diagnosis not present

## 2015-11-30 LAB — CMP (CANCER CENTER ONLY)
ALT(SGPT): 29 U/L (ref 10–47)
AST: 32 U/L (ref 11–38)
Albumin: 3.7 g/dL (ref 3.3–5.5)
Alkaline Phosphatase: 70 U/L (ref 26–84)
BUN, Bld: 15 mg/dL (ref 7–22)
CO2: 26 meq/L (ref 18–33)
Calcium: 9.2 mg/dL (ref 8.0–10.3)
Chloride: 103 meq/L (ref 98–108)
Creat: 1 mg/dL (ref 0.6–1.2)
Glucose, Bld: 101 mg/dL (ref 73–118)
Potassium: 4.1 meq/L (ref 3.3–4.7)
Sodium: 142 meq/L (ref 128–145)
Total Bilirubin: 0.7 mg/dL (ref 0.20–1.60)
Total Protein: 6.8 g/dL (ref 6.4–8.1)

## 2015-11-30 LAB — CBC WITH DIFFERENTIAL (CANCER CENTER ONLY)
BASO#: 0 10*3/uL (ref 0.0–0.2)
BASO%: 0.5 % (ref 0.0–2.0)
EOS ABS: 0.2 10*3/uL (ref 0.0–0.5)
EOS%: 4.3 % (ref 0.0–7.0)
HCT: 40.5 % (ref 38.7–49.9)
HEMOGLOBIN: 13.7 g/dL (ref 13.0–17.1)
LYMPH#: 0.9 10*3/uL (ref 0.9–3.3)
LYMPH%: 20.6 % (ref 14.0–48.0)
MCH: 32.2 pg (ref 28.0–33.4)
MCHC: 33.8 g/dL (ref 32.0–35.9)
MCV: 95 fL (ref 82–98)
MONO#: 0.6 10*3/uL (ref 0.1–0.9)
MONO%: 14.9 % — AB (ref 0.0–13.0)
NEUT%: 59.7 % (ref 40.0–80.0)
NEUTROS ABS: 2.5 10*3/uL (ref 1.5–6.5)
PLATELETS: 246 10*3/uL (ref 145–400)
RBC: 4.25 10*6/uL (ref 4.20–5.70)
RDW: 12.4 % (ref 11.1–15.7)
WBC: 4.2 10*3/uL (ref 4.0–10.0)

## 2015-11-30 LAB — LACTATE DEHYDROGENASE: LDH: 172 U/L (ref 125–245)

## 2015-11-30 MED ORDER — SODIUM CHLORIDE 0.9 % IJ SOLN
10.0000 mL | INTRAMUSCULAR | Status: DC | PRN
  Administered 2015-11-30: 10 mL
  Filled 2015-11-30: qty 10

## 2015-11-30 MED ORDER — SODIUM CHLORIDE 0.9 % IV SOLN
240.0000 mg | Freq: Once | INTRAVENOUS | Status: AC
  Administered 2015-11-30: 240 mg via INTRAVENOUS
  Filled 2015-11-30: qty 8

## 2015-11-30 MED ORDER — SODIUM CHLORIDE 0.9 % IV SOLN
Freq: Once | INTRAVENOUS | Status: AC
  Administered 2015-11-30: 11:00:00 via INTRAVENOUS

## 2015-11-30 MED ORDER — HEPARIN SOD (PORK) LOCK FLUSH 100 UNIT/ML IV SOLN
500.0000 [IU] | Freq: Once | INTRAVENOUS | Status: AC | PRN
  Administered 2015-11-30: 500 [IU]
  Filled 2015-11-30: qty 5

## 2015-11-30 NOTE — Progress Notes (Signed)
Hematology and Oncology Follow Up Visit  Brian [name] [id number] March 23, 1961 55 y.o. 11/30/2015   Principle Diagnosis:  Recurrent Hodgkin's Disease -  S/p autologous transplant  TIA-resolved  Current Therapy:     Nivolumab s/p c#6     Interim History:  [name] is in for followup. He is doing pretty well. He had a PET scan recently. The PET scan did not show any evidence of Hodgkin's progression. He has some changes on a PET scan which were not relevant to his Hodgkin's disease. I talked to him about all this.  He got to the holidays okay. This was a tough one for him. There are a lot of personal issues that are going on now.  He feels well. He's working. He's exercising.  He heard from [facility] yesterday. It sounds like they will try to get him into the CAR-T cell protocol in a month.  He's had no problems with weakness. He's had no change in bowel bladder habits. He's had no rashes.. 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 This would does not affect his quality of life.   Overall, his performance status is ECOG 1.   Medications:  Current outpatient prescriptions:  .  acetaminophen (TYLENOL) 325 MG tablet, Take 325 mg by mouth every 6 (six) hours as needed for mild pain., Disp: , Rfl:  .  ALPRAZolam (XANAX) 0.5 MG tablet, take 1 tablet by mouth at bedtime if needed for sleep, Disp: 30 tablet, Rfl: 2 .  aspirin 81 MG chewable tablet, Chew 1 tablet (81 mg total) by mouth daily., Disp: 30 tablet, Rfl: 0 .  Cholecalciferol (VITAMIN D-3) 1000 UNITS CAPS, Take 1 capsule by mouth daily. , Disp: , Rfl:  .  citalopram (CELEXA) 20 MG tablet, Take 1 tablet (20 mg total) by mouth daily., Disp: 30 tablet, Rfl: 6 .  lidocaine-prilocaine (EMLA) cream, Apply 1 application topically as needed. Apply nickel sized amount to portacath site at least 1 hour prior to chemotherapy., Disp: 30 g, Rfl: 1 .  LORazepam (ATIVAN) 1 MG tablet, TAKE 1/2 A TABLET BY MOUTH TWICE DAILY DURING THE DAY FOR  ANXIETY, AND 1 TABLET AT BEDTIME IF NEEDED, Disp: 20 tablet, Rfl: 3 .  Multiple Vitamin (MULTIVITAMIN) tablet, Take 1 tablet by mouth daily., Disp: , Rfl:  No current facility-administered medications for this visit.  Facility-Administered Medications Ordered in Other Visits:  .  0.9 %  sodium chloride infusion, , Intravenous, Once, [name], MD .  heparin lock flush 100 unit/mL, 500 Units, Intracatheter, Once PRN, [name], MD .  nivolumab (OPDIVO) 240 mg in sodium chloride 0.9 % 100 mL chemo infusion, 240 mg, Intravenous, Once, [name], MD .  sodium chloride 0.9 % injection 10 mL, 10 mL, Intracatheter, PRN, [name], MD  Allergies: No Known Allergies  Past Medical History, Surgical history, Social history, and Family History were reviewed and updated.  Review of Systems: As above  Physical Exam:  height is 5\' 10"  (1.778 m) and weight is 176 lb (79.833 kg). His oral temperature is 98 F (36.7 C). His blood pressure is 137/74 and his pulse is 74. His respiration is 16.   Well-developed and well-nourished white gentleman. Head and neck exam shows no ocular or oral lesions. There are no palpable cervical or supraclavicular lymph nodes. Lungs are clear. Cardiac exam regular rate and rhythm with no murmurs, rubs or bruits.. Abdomen is soft. He has good bowel sounds. There is  no fluid wave. There is no palpable liver or spleen tip. Back exam shows a thoracoscopy site in the left lateral chest wall. This is healed. There is no swelling or erythema. Extremities shows no clubbing, cyanosis or edema. Skin exam shows no rashes, ecchymoses or petechia . Neurological exam is nonfocal.  Lab Results  Component Value Date   WBC 4.2 11/30/2015   HGB 13.7 11/30/2015   HCT 40.5 11/30/2015   MCV 95 11/30/2015   PLT 246 11/30/2015     Chemistry      Component Value Date/Time   NA 141 11/09/2015 0816   NA 137 07/30/2015 0329   NA 142 03/25/2013 0828   K 4.7 11/09/2015  0816   K 5.1 07/30/2015 0329   K 4.1 03/25/2013 0828   CL 101 11/09/2015 0816   CL 103 07/30/2015 0329   CL 107 03/25/2013 0828   CO2 29 11/09/2015 0816   CO2 29 07/30/2015 0329   CO2 27 03/25/2013 0828   BUN 17 11/09/2015 0816   BUN 22* 07/30/2015 0329   BUN 12.7 03/25/2013 0828   CREATININE 1.1 11/09/2015 0816   CREATININE 1.33* 07/30/2015 0329   CREATININE 0.9 03/25/2013 0828      Component Value Date/Time   CALCIUM 10.0 11/09/2015 0816   CALCIUM 8.9 07/30/2015 0329   CALCIUM 9.2 03/25/2013 0828   ALKPHOS 68 11/09/2015 0816   ALKPHOS 50 07/30/2015 0329   ALKPHOS 122 03/25/2013 0828   AST 25 11/09/2015 0816   AST 33 07/30/2015 0329   AST 33 03/25/2013 0828   ALT 21 11/09/2015 0816   ALT 28 07/30/2015 0329   ALT 65* 03/25/2013 0828   BILITOT 1.00 11/09/2015 0816   BILITOT 0.6 07/30/2015 0329   BILITOT 0.31 03/25/2013 0828         Impression and Plan: Brian [name] is a 55 year old gentleman.  He has history of recurrent Hodgkin's disease. 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 He then had a recurrence after this.  So far, he's tolerated the Nivolumab well. 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 He also had that TIA which may have had some residual effect.  Hopefully, he will go for the CAR-T cell therapy within a month.  We will go ahead and give him one more dose of Nivolumab. This will be in 2 weeks.  I spent about 35 minutes with him today.     Cassell Smiles, MD 1/4/201711:18 AM

## 2015-11-30 NOTE — Patient Instructions (Signed)
Nivolumab injection What is this medicine? NIVOLUMAB (nye VOL ue mab) is a monoclonal antibody. It is used to treat melanoma, lung cancer, kidney cancer, and Hodgkin lymphoma. This medicine may be used for other purposes; ask your health care provider or pharmacist if you have questions. What should I tell my health care provider before I take this medicine? They need to know if you have any of these conditions: -diabetes -immune system problems -kidney disease -liver disease -lung disease -organ transplant -stomach or intestine problems -thyroid disease -an unusual or allergic reaction to nivolumab, other medicines, foods, dyes, or preservatives -pregnant or trying to get pregnant -breast-feeding How should I use this medicine? This medicine is for infusion into a vein. It is given by a health care professional in a hospital or clinic setting. A special MedGuide will be given to you before each treatment. Be sure to read this information carefully each time. Talk to your pediatrician regarding the use of this medicine in children. Special care may be needed. Overdosage: If you think you have taken too much of this medicine contact a poison control center or emergency room at once. NOTE: This medicine is only for you. Do not share this medicine with others. What if I miss a dose? It is important not to miss your dose. Call your doctor or health care professional if you are unable to keep an appointment. What may interact with this medicine? Interactions have not been studied. Give your health care provider a list of all the medicines, herbs, non-prescription drugs, or dietary supplements you use. Also tell them if you smoke, drink alcohol, or use illegal drugs. Some items may interact with your medicine. This list may not describe all possible interactions. Give your health care provider a list of all the medicines, herbs, non-prescription drugs, or dietary supplements you use. Also tell  them if you smoke, drink alcohol, or use illegal drugs. Some items may interact with your medicine. What should I watch for while using this medicine? This drug may make you feel generally unwell. Continue your course of treatment even though you feel ill unless your doctor tells you to stop. You may need blood work done while you are taking this medicine. Do not become pregnant while taking this medicine or for 5 months after stopping it. Women should inform their doctor if they wish to become pregnant or think they might be pregnant. There is a potential for serious side effects to an unborn child. Talk to your health care professional or pharmacist for more information. Do not breast-feed an infant while taking this medicine. What side effects may I notice from receiving this medicine? Side effects that you should report to your doctor or health care professional as soon as possible: -allergic reactions like skin rash, itching or hives, swelling of the face, lips, or tongue -black, tarry stools -blood in the urine -bloody or watery diarrhea -changes in vision -change in sex drive -changes in emotions or moods -chest pain -confusion -cough -decreased appetite -diarrhea -facial flushing -feeling faint or lightheaded -fever, chills -hair loss -hallucination, loss of contact with reality -headache -irritable -joint pain -loss of memory -muscle pain -muscle weakness -seizures -shortness of breath -signs and symptoms of high blood sugar such as dizziness; dry mouth; dry skin; fruity breath; nausea; stomach pain; increased hunger or thirst; increased urination -signs and symptoms of kidney injury like trouble passing urine or change in the amount of urine -signs and symptoms of liver injury like dark yellow or   brown urine; general ill feeling or flu-like symptoms; light-colored stools; loss of appetite; nausea; right upper belly pain; unusually weak or tired; yellowing of the eyes or  skin -stiff neck -swelling of the ankles, feet, hands -weight gain Side effects that usually do not require medical attention (report to your doctor or health care professional if they continue or are bothersome): -bone pain -constipation -tiredness -vomiting This list may not describe all possible side effects. Call your doctor for medical advice about side effects. You may report side effects to FDA at 1-800-FDA-1088. Where should I keep my medicine? This drug is given in a hospital or clinic and will not be stored at home. NOTE: This sheet is a summary. It may not cover all possible information. If you have questions about this medicine, talk to your doctor, pharmacist, or health care provider.    2016, Elsevier/Gold Standard. (2015-04-13 10:03:42)  

## 2015-12-07 ENCOUNTER — Other Ambulatory Visit: Payer: Self-pay | Admitting: Pharmacist

## 2015-12-07 DIAGNOSIS — C8195 Hodgkin lymphoma, unspecified, lymph nodes of inguinal region and lower limb: Secondary | ICD-10-CM

## 2015-12-08 ENCOUNTER — Telehealth: Payer: Self-pay | Admitting: Hematology & Oncology

## 2015-12-08 NOTE — Telephone Encounter (Signed)
APPROVE: [phone]  [facility]  To: [name] Ph: [phone]  PATIENT: Brian [name]  DOB: 07-16-1961 DOS: 11/30/2015 to 11/25/2016 ID: [id number] DX: [id number] CODE: [id number] OPDIVO

## 2015-12-10 ENCOUNTER — Other Ambulatory Visit: Payer: Self-pay | Admitting: Hematology & Oncology

## 2015-12-14 ENCOUNTER — Ambulatory Visit (HOSPITAL_BASED_OUTPATIENT_CLINIC_OR_DEPARTMENT_OTHER): Payer: Commercial Managed Care - PPO | Admitting: Hematology & Oncology

## 2015-12-14 ENCOUNTER — Ambulatory Visit (HOSPITAL_BASED_OUTPATIENT_CLINIC_OR_DEPARTMENT_OTHER): Payer: Commercial Managed Care - PPO

## 2015-12-14 ENCOUNTER — Other Ambulatory Visit (HOSPITAL_BASED_OUTPATIENT_CLINIC_OR_DEPARTMENT_OTHER): Payer: Commercial Managed Care - PPO

## 2015-12-14 VITALS — BP 129/73 | HR 69 | Temp 97.8°F | Resp 18

## 2015-12-14 DIAGNOSIS — C8195 Hodgkin lymphoma, unspecified, lymph nodes of inguinal region and lower limb: Secondary | ICD-10-CM

## 2015-12-14 DIAGNOSIS — C8590 Non-Hodgkin lymphoma, unspecified, unspecified site: Secondary | ICD-10-CM | POA: Diagnosis not present

## 2015-12-14 DIAGNOSIS — C8190 Hodgkin lymphoma, unspecified, unspecified site: Secondary | ICD-10-CM | POA: Diagnosis not present

## 2015-12-14 DIAGNOSIS — Z5112 Encounter for antineoplastic immunotherapy: Secondary | ICD-10-CM

## 2015-12-14 LAB — CMP (CANCER CENTER ONLY)
ALK PHOS: 63 U/L (ref 26–84)
ALT(SGPT): 21 U/L (ref 10–47)
AST: 25 U/L (ref 11–38)
Albumin: 3.8 g/dL (ref 3.3–5.5)
BUN: 18 mg/dL (ref 7–22)
CHLORIDE: 103 meq/L (ref 98–108)
CO2: 27 meq/L (ref 18–33)
CREATININE: 1.2 mg/dL (ref 0.6–1.2)
Calcium: 9.1 mg/dL (ref 8.0–10.3)
Glucose, Bld: 95 mg/dL (ref 73–118)
Potassium: 4.3 mEq/L (ref 3.3–4.7)
SODIUM: 141 meq/L (ref 128–145)
TOTAL PROTEIN: 6.7 g/dL (ref 6.4–8.1)
Total Bilirubin: 0.7 mg/dl (ref 0.20–1.60)

## 2015-12-14 LAB — CBC WITH DIFFERENTIAL (CANCER CENTER ONLY)
BASO#: 0.1 10*3/uL (ref 0.0–0.2)
BASO%: 1.1 % (ref 0.0–2.0)
EOS%: 5.6 % (ref 0.0–7.0)
Eosinophils Absolute: 0.3 10*3/uL (ref 0.0–0.5)
HEMATOCRIT: 39.9 % (ref 38.7–49.9)
HGB: 13.5 g/dL (ref 13.0–17.1)
LYMPH#: 0.8 10*3/uL — AB (ref 0.9–3.3)
LYMPH%: 18.7 % (ref 14.0–48.0)
MCH: 32.1 pg (ref 28.0–33.4)
MCHC: 33.8 g/dL (ref 32.0–35.9)
MCV: 95 fL (ref 82–98)
MONO#: 0.7 10*3/uL (ref 0.1–0.9)
MONO%: 15.1 % — ABNORMAL HIGH (ref 0.0–13.0)
NEUT%: 59.5 % (ref 40.0–80.0)
NEUTROS ABS: 2.7 10*3/uL (ref 1.5–6.5)
Platelets: 198 10*3/uL (ref 145–400)
RBC: 4.2 10*6/uL (ref 4.20–5.70)
RDW: 12.6 % (ref 11.1–15.7)
WBC: 4.5 10*3/uL (ref 4.0–10.0)

## 2015-12-14 LAB — TSH: TSH: 2.743 m[IU]/L (ref 0.320–4.118)

## 2015-12-14 LAB — LACTATE DEHYDROGENASE: LDH: 209 U/L (ref 125–245)

## 2015-12-14 MED ORDER — SODIUM CHLORIDE 0.9 % IV SOLN
240.0000 mg | Freq: Once | INTRAVENOUS | Status: AC
  Administered 2015-12-14: 240 mg via INTRAVENOUS
  Filled 2015-12-14: qty 24

## 2015-12-14 MED ORDER — SODIUM CHLORIDE 0.9 % IJ SOLN
10.0000 mL | INTRAMUSCULAR | Status: DC | PRN
  Administered 2015-12-14: 10 mL
  Filled 2015-12-14: qty 10

## 2015-12-14 MED ORDER — SODIUM CHLORIDE 0.9 % IV SOLN
Freq: Once | INTRAVENOUS | Status: AC
Start: 2015-12-14 — End: 2015-12-14
  Administered 2015-12-14: 11:00:00 via INTRAVENOUS

## 2015-12-14 MED ORDER — HEPARIN SOD (PORK) LOCK FLUSH 100 UNIT/ML IV SOLN
500.0000 [IU] | Freq: Once | INTRAVENOUS | Status: AC | PRN
  Administered 2015-12-14: 500 [IU]
  Filled 2015-12-14: qty 5

## 2015-12-14 NOTE — Patient Instructions (Signed)
[facility] Discharge Instructions for Patients Receiving Chemotherapy  Today you received the following chemotherapy agents Opdivo.  To help prevent nausea and vomiting after your treatment, we encourage you to take your nausea medication.   If you develop nausea and vomiting that is not controlled by your nausea medication, call the clinic.   BELOW ARE SYMPTOMS THAT SHOULD BE REPORTED IMMEDIATELY:  *FEVER GREATER THAN 100.5 F  *CHILLS WITH OR WITHOUT FEVER  NAUSEA AND VOMITING THAT IS NOT CONTROLLED WITH YOUR NAUSEA MEDICATION  *UNUSUAL SHORTNESS OF BREATH  *UNUSUAL BRUISING OR BLEEDING  TENDERNESS IN MOUTH AND THROAT WITH OR WITHOUT PRESENCE OF ULCERS  *URINARY PROBLEMS  *BOWEL PROBLEMS  UNUSUAL RASH Items with * indicate a potential emergency and should be followed up as soon as possible.  Feel free to call the clinic you have any questions or concerns. The clinic phone number is [phone].  Please show the CHEMO ALERT CARD at check-in to the Emergency Department and triage nurse.   

## 2015-12-14 NOTE — Progress Notes (Signed)
Hematology and Oncology Follow Up Visit  LAO VARCOE [id number] 1961/09/30 55 y.o. 12/14/2015   Principle Diagnosis:  Recurrent Hodgkin's Disease -  S/p autologous transplant  TIA-resolved  Current Therapy:     Nivolumab s/p c#6     Interim History:  [name] is in for followup. He is doing pretty well. He still is having some neurological issues. This [place] accommodation of the [place] and a TIA. He is on some vitamin B-12. He is on baby aspirin.  He's not yet heard from [facility] as to when the transplant will be.  He is still working. He is not yet working out.   His appetite is doing pretty well. He's had no nausea or vomiting.  He's he's had noChange in bowel or bladder habits.  He's had no rashes.  Overall, his performance status is ECOG 1.   Medications:  Current outpatient prescriptions:  .  acetaminophen (TYLENOL) 325 MG tablet, Take 325 mg by mouth every 6 (six) hours as needed for mild pain., Disp: , Rfl:  .  ALPRAZolam (XANAX) 0.5 MG tablet, take 1 tablet by mouth at bedtime if needed for sleep, Disp: 30 tablet, Rfl: 2 .  aspirin 81 MG chewable tablet, Chew 1 tablet (81 mg total) by mouth daily., Disp: 30 tablet, Rfl: 0 .  Cholecalciferol (VITAMIN D-3) 1000 UNITS CAPS, Take 1 capsule by mouth daily. , Disp: , Rfl:  .  citalopram (CELEXA) 20 MG tablet, take 1 tablet by mouth once daily, Disp: 30 tablet, Rfl: 6 .  Cyanocobalamin (VITAMIN B 12) 250 MCG LOZG, Take 500 mcg by mouth every morning., Disp: , Rfl:  .  lidocaine-prilocaine (EMLA) cream, Apply 1 application topically as needed. Apply nickel sized amount to portacath site at least 1 hour prior to chemotherapy., Disp: 30 g, Rfl: 1 .  LORazepam (ATIVAN) 1 MG tablet, TAKE 1/2 A TABLET BY MOUTH TWICE DAILY DURING THE DAY FOR ANXIETY, AND 1 TABLET AT BEDTIME IF NEEDED, Disp: 20 tablet, Rfl: 3 .  Multiple Vitamin (MULTIVITAMIN) tablet, Take 1 tablet by mouth daily., Disp: , Rfl:   Allergies: No Known  Allergies  Past Medical History, Surgical history, Social history, and Family History were reviewed and updated.  Review of Systems: As above  Physical Exam:  oral temperature is 97.8 F (36.6 C). His blood pressure is 129/73 and his pulse is 69. His respiration is 18.   Well-developed and well-nourished white gentleman. Head and neck exam shows no ocular or oral lesions. There are no palpable cervical or supraclavicular lymph nodes. Lungs are clear. Cardiac exam regular rate and rhythm with no murmurs, rubs or bruits.. Abdomen is soft. He has good bowel sounds. There is no fluid wave. There is no palpable liver or spleen tip. Back exam shows a thoracoscopy site in the left lateral chest wall. This is healed. There is no swelling or erythema. Extremities shows no clubbing, cyanosis or edema. Skin exam shows no rashes, ecchymoses or petechia . Neurological exam is nonfocal.  Lab Results  Component Value Date   WBC 4.5 12/14/2015   HGB 13.5 12/14/2015   HCT 39.9 12/14/2015   MCV 95 12/14/2015   PLT 198 12/14/2015     Chemistry      Component Value Date/Time   NA 142 11/30/2015 1022   NA 137 07/30/2015 0329   NA 142 03/25/2013 0828   K 4.1 11/30/2015 1022   K 5.1 07/30/2015 0329   K 4.1 03/25/2013 [id number]  CL 103 11/30/2015 1022   CL 103 07/30/2015 0329   CL 107 03/25/2013 0828   CO2 26 11/30/2015 1022   CO2 29 07/30/2015 0329   CO2 27 03/25/2013 0828   BUN 15 11/30/2015 1022   BUN 22* 07/30/2015 0329   BUN 12.7 03/25/2013 0828   CREATININE 1.0 11/30/2015 1022   CREATININE 1.33* 07/30/2015 0329   CREATININE 0.9 03/25/2013 0828      Component Value Date/Time   CALCIUM 9.2 11/30/2015 1022   CALCIUM 8.9 07/30/2015 0329   CALCIUM 9.2 03/25/2013 0828   ALKPHOS 70 11/30/2015 1022   ALKPHOS 50 07/30/2015 0329   ALKPHOS 122 03/25/2013 0828   AST 32 11/30/2015 1022   AST 33 07/30/2015 0329   AST 33 03/25/2013 0828   ALT 29 11/30/2015 1022   ALT 28 07/30/2015 0329   ALT 65*  03/25/2013 0828   BILITOT 0.70 11/30/2015 1022   BILITOT 0.6 07/30/2015 0329   BILITOT 0.31 03/25/2013 0828         Impression and Plan: [name] is a 55 year old gentleman.  He has history of recurrent Hodgkin's disease. 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 He then had a recurrence after this.  So far, he's tolerated the Nivolumab well. 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 He also had that TIA which may have had some residual effect.  Hopefully, he will go for the CAR-T cell therapy in February. He is still not yet heard from [facility] As such, we will continue him on Nivolumab.  We will go ahead and give him Anotherre dose of Nivolumab. This will be in 2 weeks.  I spent about 35 minutes with him today.     I [name], MD 1/18/201710:24 AM

## 2015-12-28 ENCOUNTER — Ambulatory Visit (HOSPITAL_BASED_OUTPATIENT_CLINIC_OR_DEPARTMENT_OTHER): Payer: Commercial Managed Care - PPO | Admitting: Hematology & Oncology

## 2015-12-28 ENCOUNTER — Other Ambulatory Visit: Payer: Commercial Managed Care - PPO

## 2015-12-28 ENCOUNTER — Ambulatory Visit (HOSPITAL_BASED_OUTPATIENT_CLINIC_OR_DEPARTMENT_OTHER): Payer: Commercial Managed Care - PPO

## 2015-12-28 ENCOUNTER — Encounter: Payer: Self-pay | Admitting: Hematology & Oncology

## 2015-12-28 VITALS — BP 124/74 | HR 70 | Temp 98.2°F | Resp 18 | Ht 70.0 in | Wt 176.0 lb

## 2015-12-28 DIAGNOSIS — C8195 Hodgkin lymphoma, unspecified, lymph nodes of inguinal region and lower limb: Secondary | ICD-10-CM

## 2015-12-28 DIAGNOSIS — Z5112 Encounter for antineoplastic immunotherapy: Secondary | ICD-10-CM

## 2015-12-28 DIAGNOSIS — C8190 Hodgkin lymphoma, unspecified, unspecified site: Secondary | ICD-10-CM

## 2015-12-28 MED ORDER — HEPARIN SOD (PORK) LOCK FLUSH 100 UNIT/ML IV SOLN
500.0000 [IU] | Freq: Once | INTRAVENOUS | Status: AC | PRN
  Administered 2015-12-28: 500 [IU]
  Filled 2015-12-28: qty 5

## 2015-12-28 MED ORDER — SODIUM CHLORIDE 0.9 % IV SOLN
240.0000 mg | Freq: Once | INTRAVENOUS | Status: AC
  Administered 2015-12-28: 240 mg via INTRAVENOUS
  Filled 2015-12-28: qty 20

## 2015-12-28 MED ORDER — SODIUM CHLORIDE 0.9 % IV SOLN
Freq: Once | INTRAVENOUS | Status: AC
  Administered 2015-12-28: 13:00:00 via INTRAVENOUS

## 2015-12-28 MED ORDER — SODIUM CHLORIDE 0.9 % IJ SOLN
10.0000 mL | INTRAMUSCULAR | Status: DC | PRN
  Administered 2015-12-28: 10 mL
  Filled 2015-12-28: qty 10

## 2015-12-28 NOTE — Progress Notes (Signed)
Hematology and Oncology Follow Up Visit  Brian [name] [id number] 09-25-1961 55 y.o. 12/28/2015   Principle Diagnosis:  Recurrent Hodgkin's Disease -  S/p autologous transplant  TIA-resolved  Current Therapy:     Nivolumab s/p c#7     Interim History:  [name] is in for followup. It sounds like things are moving ahead now for the clinical trial. He was seen at [facility] recently. It sounds like the CAR-T cells will be infused in a couple months. He had blood taken and the T cells will be altered.  He is to get bendamustine before the T cells are reinfused. It will be nice if the bendamustine can be done up here for him. It be much more convenient. Since only takes about 30 minutes to give the bendamustine now, I hate to see him 5 all in [place] for 2 days in a row.  He still has the issues with his foot. He has some weakness in the foot.  He's had no bleeding. He's had no fever. He's had no cough.  He does have a little bit more fatigue. This may be a chemotherapy as effect of the Nivolumab  He has not noted any swollen lymph nodes.   Overall, his performance status is ECOG 1.   Medications:  Current outpatient prescriptions:  .  acetaminophen (TYLENOL) 325 MG tablet, Take 325 mg by mouth every 6 (six) hours as needed for mild pain., Disp: , Rfl:  .  ALPRAZolam (XANAX) 0.5 MG tablet, take 1 tablet by mouth at bedtime if needed for sleep, Disp: 30 tablet, Rfl: 2 .  aspirin 81 MG chewable tablet, Chew 1 tablet (81 mg total) by mouth daily., Disp: 30 tablet, Rfl: 0 .  Cholecalciferol (VITAMIN D-3) 1000 UNITS CAPS, Take 1 capsule by mouth daily. , Disp: , Rfl:  .  citalopram (CELEXA) 20 MG tablet, take 1 tablet by mouth once daily, Disp: 30 tablet, Rfl: 6 .  Cyanocobalamin (RA VITAMIN B-12 TR) 1000 MCG TBCR, Take by mouth., Disp: , Rfl:  .  lidocaine-prilocaine (EMLA) cream, Apply 1 application topically as needed. Apply nickel sized amount to portacath site at least 1 hour  prior to chemotherapy., Disp: 30 g, Rfl: 1 .  LORazepam (ATIVAN) 1 MG tablet, TAKE 1/2 A TABLET BY MOUTH TWICE DAILY DURING THE DAY FOR ANXIETY, AND 1 TABLET AT BEDTIME IF NEEDED, Disp: 20 tablet, Rfl: 3 .  Multiple Vitamin (MULTIVITAMIN) tablet, Take 1 tablet by mouth daily., Disp: , Rfl:   Allergies: No Known Allergies  Past Medical History, Surgical history, Social history, and Family History were reviewed and updated.  Review of Systems: As above  Physical Exam:  height is 5\' 10"  (1.778 m) and weight is 176 lb (79.833 kg). His oral temperature is 98.2 F (36.8 C). His blood pressure is 124/74 and his pulse is 70. His respiration is 18.   Well-developed and well-nourished white gentleman. Head and neck exam shows no ocular or oral lesions. There are no palpable cervical or supraclavicular lymph nodes. Lungs are clear. Cardiac exam regular rate and rhythm with no murmurs, rubs or bruits.. Abdomen is soft. He has good bowel sounds. There is no fluid wave. There is no palpable liver or spleen tip. Back exam shows a thoracoscopy site in the left lateral chest wall. This is healed. There is no swelling or erythema. Extremities shows no clubbing, cyanosis or edema. Skin exam shows no rashes, ecchymoses or petechia . Neurological exam is nonfocal.  Lab Results  Component Value Date   WBC 4.5 12/14/2015   HGB 13.5 12/14/2015   HCT 39.9 12/14/2015   MCV 95 12/14/2015   PLT 198 12/14/2015     Chemistry      Component Value Date/Time   NA 141 12/14/2015 0917   NA 137 07/30/2015 0329   NA 142 03/25/2013 0828   K 4.3 12/14/2015 0917   K 5.1 07/30/2015 0329   K 4.1 03/25/2013 0828   CL 103 12/14/2015 0917   CL 103 07/30/2015 0329   CL 107 03/25/2013 0828   CO2 27 12/14/2015 0917   CO2 29 07/30/2015 0329   CO2 27 03/25/2013 0828   BUN 18 12/14/2015 0917   BUN 22* 07/30/2015 0329   BUN 12.7 03/25/2013 0828   CREATININE 1.2 12/14/2015 0917   CREATININE 1.33* 07/30/2015 0329    CREATININE 0.9 03/25/2013 0828      Component Value Date/Time   CALCIUM 9.1 12/14/2015 0917   CALCIUM 8.9 07/30/2015 0329   CALCIUM 9.2 03/25/2013 0828   ALKPHOS 63 12/14/2015 0917   ALKPHOS 50 07/30/2015 0329   ALKPHOS 122 03/25/2013 0828   AST 25 12/14/2015 0917   AST 33 07/30/2015 0329   AST 33 03/25/2013 0828   ALT 21 12/14/2015 0917   ALT 28 07/30/2015 0329   ALT 65* 03/25/2013 0828   BILITOT 0.70 12/14/2015 0917   BILITOT 0.6 07/30/2015 0329   BILITOT 0.31 03/25/2013 0828         Impression and Plan: Brian [name] is a 55 year old gentleman.  He has history of recurrent Hodgkin's disease. 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 He then had a recurrence after this.  So far, he's tolerated the Nivolumab well. 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 He also had that TIA which may have had some residual effect.  Again, it sounds like the clinical trial will finally it started in late March or early April.  I will continue to treat him every 3 weeks. I'm sure Brian [name] will let us know when his last treatment will be.   I spent about 35 minutes with him today.     Cassell Smiles, MD 2/1/201712:38 PM

## 2015-12-28 NOTE — Patient Instructions (Signed)
Nivolumab injection What is this medicine? NIVOLUMAB (nye VOL ue mab) is a monoclonal antibody. It is used to treat melanoma, lung cancer, kidney cancer, and Hodgkin lymphoma. This medicine may be used for other purposes; ask your health care provider or pharmacist if you have questions. What should I tell my health care provider before I take this medicine? They need to know if you have any of these conditions: -diabetes -immune system problems -kidney disease -liver disease -lung disease -organ transplant -stomach or intestine problems -thyroid disease -an unusual or allergic reaction to nivolumab, other medicines, foods, dyes, or preservatives -pregnant or trying to get pregnant -breast-feeding How should I use this medicine? This medicine is for infusion into a vein. It is given by a health care professional in a hospital or clinic setting. A special MedGuide will be given to you before each treatment. Be sure to read this information carefully each time. Talk to your pediatrician regarding the use of this medicine in children. Special care may be needed. Overdosage: If you think you have taken too much of this medicine contact a poison control center or emergency room at once. NOTE: This medicine is only for you. Do not share this medicine with others. What if I miss a dose? It is important not to miss your dose. Call your doctor or health care professional if you are unable to keep an appointment. What may interact with this medicine? Interactions have not been studied. Give your health care provider a list of all the medicines, herbs, non-prescription drugs, or dietary supplements you use. Also tell them if you smoke, drink alcohol, or use illegal drugs. Some items may interact with your medicine. This list may not describe all possible interactions. Give your health care provider a list of all the medicines, herbs, non-prescription drugs, or dietary supplements you use. Also tell  them if you smoke, drink alcohol, or use illegal drugs. Some items may interact with your medicine. What should I watch for while using this medicine? This drug may make you feel generally unwell. Continue your course of treatment even though you feel ill unless your doctor tells you to stop. You may need blood work done while you are taking this medicine. Do not become pregnant while taking this medicine or for 5 months after stopping it. Women should inform their doctor if they wish to become pregnant or think they might be pregnant. There is a potential for serious side effects to an unborn child. Talk to your health care professional or pharmacist for more information. Do not breast-feed an infant while taking this medicine. What side effects may I notice from receiving this medicine? Side effects that you should report to your doctor or health care professional as soon as possible: -allergic reactions like skin rash, itching or hives, swelling of the face, lips, or tongue -black, tarry stools -blood in the urine -bloody or watery diarrhea -changes in vision -change in sex drive -changes in emotions or moods -chest pain -confusion -cough -decreased appetite -diarrhea -facial flushing -feeling faint or lightheaded -fever, chills -hair loss -hallucination, loss of contact with reality -headache -irritable -joint pain -loss of memory -muscle pain -muscle weakness -seizures -shortness of breath -signs and symptoms of high blood sugar such as dizziness; dry mouth; dry skin; fruity breath; nausea; stomach pain; increased hunger or thirst; increased urination -signs and symptoms of kidney injury like trouble passing urine or change in the amount of urine -signs and symptoms of liver injury like dark yellow or   brown urine; general ill feeling or flu-like symptoms; light-colored stools; loss of appetite; nausea; right upper belly pain; unusually weak or tired; yellowing of the eyes or  skin -stiff neck -swelling of the ankles, feet, hands -weight gain Side effects that usually do not require medical attention (report to your doctor or health care professional if they continue or are bothersome): -bone pain -constipation -tiredness -vomiting This list may not describe all possible side effects. Call your doctor for medical advice about side effects. You may report side effects to FDA at 1-800-FDA-1088. Where should I keep my medicine? This drug is given in a hospital or clinic and will not be stored at home. NOTE: This sheet is a summary. It may not cover all possible information. If you have questions about this medicine, talk to your doctor, pharmacist, or health care provider.    2016, Elsevier/Gold Standard. (2015-04-13 10:03:42)  

## 2016-01-04 ENCOUNTER — Other Ambulatory Visit: Payer: Commercial Managed Care - PPO

## 2016-01-04 ENCOUNTER — Ambulatory Visit: Payer: Commercial Managed Care - PPO

## 2016-01-04 ENCOUNTER — Ambulatory Visit: Payer: Commercial Managed Care - PPO | Admitting: Hematology & Oncology

## 2016-01-06 ENCOUNTER — Telehealth: Payer: Self-pay | Admitting: Hematology & Oncology

## 2016-01-06 NOTE — Telephone Encounter (Signed)
Called patient to ask if we could move his appt from Wednesday 01/25/2016 to Tuesday 01/24/2016. Dr. Marin Olp needs to leave by 1pm on 01/25/2016. Patient wants to keep appt at the same time. Laverna Peace agreed to see patient as an overbook appt.       [facility].

## 2016-01-11 ENCOUNTER — Ambulatory Visit (HOSPITAL_BASED_OUTPATIENT_CLINIC_OR_DEPARTMENT_OTHER): Payer: Commercial Managed Care - PPO

## 2016-01-11 ENCOUNTER — Other Ambulatory Visit (HOSPITAL_BASED_OUTPATIENT_CLINIC_OR_DEPARTMENT_OTHER): Payer: Commercial Managed Care - PPO

## 2016-01-11 ENCOUNTER — Ambulatory Visit (HOSPITAL_BASED_OUTPATIENT_CLINIC_OR_DEPARTMENT_OTHER): Payer: Commercial Managed Care - PPO | Admitting: Hematology & Oncology

## 2016-01-11 ENCOUNTER — Encounter: Payer: Self-pay | Admitting: Hematology & Oncology

## 2016-01-11 VITALS — BP 123/73 | HR 83 | Temp 98.5°F | Resp 18 | Ht 70.0 in | Wt 175.0 lb

## 2016-01-11 DIAGNOSIS — Z5112 Encounter for antineoplastic immunotherapy: Secondary | ICD-10-CM

## 2016-01-11 DIAGNOSIS — C8195 Hodgkin lymphoma, unspecified, lymph nodes of inguinal region and lower limb: Secondary | ICD-10-CM

## 2016-01-11 DIAGNOSIS — G629 Polyneuropathy, unspecified: Secondary | ICD-10-CM

## 2016-01-11 DIAGNOSIS — C8190 Hodgkin lymphoma, unspecified, unspecified site: Secondary | ICD-10-CM

## 2016-01-11 LAB — CMP (CANCER CENTER ONLY)
ALBUMIN: 3.9 g/dL (ref 3.3–5.5)
ALT(SGPT): 19 U/L (ref 10–47)
AST: 24 U/L (ref 11–38)
Alkaline Phosphatase: 83 U/L (ref 26–84)
BILIRUBIN TOTAL: 0.7 mg/dL (ref 0.20–1.60)
BUN, Bld: 19 mg/dL (ref 7–22)
CALCIUM: 9.1 mg/dL (ref 8.0–10.3)
CHLORIDE: 105 meq/L (ref 98–108)
CO2: 27 meq/L (ref 18–33)
Creat: 1.1 mg/dl (ref 0.6–1.2)
Glucose, Bld: 161 mg/dL — ABNORMAL HIGH (ref 73–118)
Potassium: 3.9 mEq/L (ref 3.3–4.7)
Sodium: 141 mEq/L (ref 128–145)
TOTAL PROTEIN: 7 g/dL (ref 6.4–8.1)

## 2016-01-11 LAB — CBC WITH DIFFERENTIAL (CANCER CENTER ONLY)
BASO#: 0 10*3/uL (ref 0.0–0.2)
BASO%: 0.8 % (ref 0.0–2.0)
EOS ABS: 0.1 10*3/uL (ref 0.0–0.5)
EOS%: 2 % (ref 0.0–7.0)
HEMATOCRIT: 39.3 % (ref 38.7–49.9)
HEMOGLOBIN: 13.3 g/dL (ref 13.0–17.1)
LYMPH#: 0.8 10*3/uL — ABNORMAL LOW (ref 0.9–3.3)
LYMPH%: 15.2 % (ref 14.0–48.0)
MCH: 32.3 pg (ref 28.0–33.4)
MCHC: 33.8 g/dL (ref 32.0–35.9)
MCV: 95 fL (ref 82–98)
MONO#: 0.5 10*3/uL (ref 0.1–0.9)
MONO%: 10.5 % (ref 0.0–13.0)
NEUT#: 3.7 10*3/uL (ref 1.5–6.5)
NEUT%: 71.5 % (ref 40.0–80.0)
Platelets: 203 10*3/uL (ref 145–400)
RBC: 4.12 10*6/uL — ABNORMAL LOW (ref 4.20–5.70)
RDW: 12.8 % (ref 11.1–15.7)
WBC: 5.1 10*3/uL (ref 4.0–10.0)

## 2016-01-11 LAB — LACTATE DEHYDROGENASE: LDH: 181 U/L (ref 125–245)

## 2016-01-11 MED ORDER — SODIUM CHLORIDE 0.9 % IV SOLN
Freq: Once | INTRAVENOUS | Status: AC
  Administered 2016-01-11: 14:00:00 via INTRAVENOUS

## 2016-01-11 MED ORDER — HEPARIN SOD (PORK) LOCK FLUSH 100 UNIT/ML IV SOLN
500.0000 [IU] | Freq: Once | INTRAVENOUS | Status: AC | PRN
  Administered 2016-01-11: 500 [IU]
  Filled 2016-01-11: qty 5

## 2016-01-11 MED ORDER — SODIUM CHLORIDE 0.9 % IV SOLN
240.0000 mg | Freq: Once | INTRAVENOUS | Status: AC
  Administered 2016-01-11: 240 mg via INTRAVENOUS
  Filled 2016-01-11: qty 20

## 2016-01-11 MED ORDER — SODIUM CHLORIDE 0.9 % IJ SOLN
10.0000 mL | INTRAMUSCULAR | Status: DC | PRN
  Administered 2016-01-11: 10 mL
  Filled 2016-01-11: qty 10

## 2016-01-11 NOTE — Patient Instructions (Signed)
[facility] Discharge Instructions for Patients Receiving Chemotherapy  Today you received the following chemotherapy agents Opdivo.  To help prevent nausea and vomiting after your treatment, we encourage you to take your nausea medication.   If you develop nausea and vomiting that is not controlled by your nausea medication, call the clinic.   BELOW ARE SYMPTOMS THAT SHOULD BE REPORTED IMMEDIATELY:  *FEVER GREATER THAN 100.5 F  *CHILLS WITH OR WITHOUT FEVER  NAUSEA AND VOMITING THAT IS NOT CONTROLLED WITH YOUR NAUSEA MEDICATION  *UNUSUAL SHORTNESS OF BREATH  *UNUSUAL BRUISING OR BLEEDING  TENDERNESS IN MOUTH AND THROAT WITH OR WITHOUT PRESENCE OF ULCERS  *URINARY PROBLEMS  *BOWEL PROBLEMS  UNUSUAL RASH Items with * indicate a potential emergency and should be followed up as soon as possible.  Feel free to call the clinic you have any questions or concerns. The clinic phone number is [phone].  Please show the CHEMO ALERT CARD at check-in to the Emergency Department and triage nurse.   

## 2016-01-11 NOTE — Progress Notes (Signed)
Hematology and Oncology Follow Up Visit  Brian [name] [id number] 1961-08-28 55 y.o. 01/11/2016   Principle Diagnosis:  Recurrent Hodgkin's Disease -  S/p autologous transplant  TIA-resolved  Current Therapy:     Nivolumab s/p c #8     Interim History:  [name] is in for followup. He has a little bit of a cold. He has some congestion in the upper respiratory passages. He's had no fever.  He's had no nausea vomiting. He's had no cough. He's had no change in bowel or bladder habits.  He still has the neuropathy in his 8. This may be from the Adcetris. This mostly affects his left foot. I think he did have a TIA which may have been from the Adcetris.  He thinks that the CAR-T cell program is going to be initiated in late March. Again, we will certainly help him. If he needs bendamustine in our office, we can do that here. It sounds like that he is probably going had to have it at [facility] because of the protocol requirements.  He's had no rashes. He's had no joint swelling. He's had no abdominal pain.  He has not noted any swollen lymph nodes.   Overall, his performance status is ECOG 1.   Medications:  Current outpatient prescriptions:  .  acetaminophen (TYLENOL) 325 MG tablet, Take 325 mg by mouth every 6 (six) hours as needed for mild pain., Disp: , Rfl:  .  ALPRAZolam (XANAX) 0.5 MG tablet, take 1 tablet by mouth at bedtime if needed for sleep, Disp: 30 tablet, Rfl: 2 .  aspirin 81 MG chewable tablet, Chew 1 tablet (81 mg total) by mouth daily., Disp: 30 tablet, Rfl: 0 .  Cholecalciferol (VITAMIN D-3) 1000 UNITS CAPS, Take 1 capsule by mouth daily. , Disp: , Rfl:  .  citalopram (CELEXA) 20 MG tablet, take 1 tablet by mouth once daily, Disp: 30 tablet, Rfl: 6 .  Cyanocobalamin (RA VITAMIN B-12 TR) 1000 MCG TBCR, Take by mouth., Disp: , Rfl:  .  lidocaine-prilocaine (EMLA) cream, Apply 1 application topically as needed. Apply nickel sized amount to portacath site at least 1  hour prior to chemotherapy., Disp: 30 g, Rfl: 1 .  LORazepam (ATIVAN) 1 MG tablet, TAKE 1/2 A TABLET BY MOUTH TWICE DAILY DURING THE DAY FOR ANXIETY, AND 1 TABLET AT BEDTIME IF NEEDED, Disp: 20 tablet, Rfl: 3 .  Multiple Vitamin (MULTIVITAMIN) tablet, Take 1 tablet by mouth daily., Disp: , Rfl:   Allergies: No Known Allergies  Past Medical History, Surgical history, Social history, and Family History were reviewed and updated.  Review of Systems: As above  Physical Exam:  height is 5\' 10"  (1.778 m) and weight is 175 lb (79.379 kg). His oral temperature is 98.5 F (36.9 C). His blood pressure is 123/73 and his pulse is 83. His respiration is 18.   Well-developed and well-nourished white gentleman. Head and neck exam shows no ocular or oral lesions. There are no palpable cervical or supraclavicular lymph nodes. Lungs are clear. Cardiac exam regular rate and rhythm with no murmurs, rubs or bruits.. Abdomen is soft. He has good bowel sounds. There is no fluid wave. There is no palpable liver or spleen tip. Back exam shows a thoracoscopy site in the left lateral chest wall. This is healed. There is no swelling or erythema. Extremities shows no clubbing, cyanosis or edema. Skin exam shows no rashes, ecchymoses or petechia . Neurological exam is nonfocal.  Lab Results  Component Value Date   WBC 5.1 01/11/2016   HGB 13.3 01/11/2016   HCT 39.3 01/11/2016   MCV 95 01/11/2016   PLT 203 01/11/2016     Chemistry      Component Value Date/Time   NA 141 12/14/2015 0917   NA 137 07/30/2015 0329   NA 142 03/25/2013 0828   K 4.3 12/14/2015 0917   K 5.1 07/30/2015 0329   K 4.1 03/25/2013 0828   CL 103 12/14/2015 0917   CL 103 07/30/2015 0329   CL 107 03/25/2013 0828   CO2 27 12/14/2015 0917   CO2 29 07/30/2015 0329   CO2 27 03/25/2013 0828   BUN 18 12/14/2015 0917   BUN 22* 07/30/2015 0329   BUN 12.7 03/25/2013 0828   CREATININE 1.2 12/14/2015 0917   CREATININE 1.33* 07/30/2015 0329    CREATININE 0.9 03/25/2013 0828      Component Value Date/Time   CALCIUM 9.1 12/14/2015 0917   CALCIUM 8.9 07/30/2015 0329   CALCIUM 9.2 03/25/2013 0828   ALKPHOS 63 12/14/2015 0917   ALKPHOS 50 07/30/2015 0329   ALKPHOS 122 03/25/2013 0828   AST 25 12/14/2015 0917   AST 33 07/30/2015 0329   AST 33 03/25/2013 0828   ALT 21 12/14/2015 0917   ALT 28 07/30/2015 0329   ALT 65* 03/25/2013 0828   BILITOT 0.70 12/14/2015 0917   BILITOT 0.6 07/30/2015 0329   BILITOT 0.31 03/25/2013 0828         Impression and Plan: Brian [name] is a 55 year old gentleman.  He has history of recurrent Hodgkin's disease. 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 He then had a recurrence after this.  So far, he's tolerated the Nivolumab well. 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 He also had that TIA which may have had some residual effect.  Again, it sounds like the clinical trial will finally it started in late March or early April.  I will continue to treat him every 2 weeks. I'm sure Brian [name] will let us know when his last treatment will be.   I spent about 30 minutes with him today.     Cassell Smiles, MD 2/15/20172:16 PM

## 2016-01-25 ENCOUNTER — Ambulatory Visit (HOSPITAL_BASED_OUTPATIENT_CLINIC_OR_DEPARTMENT_OTHER): Payer: Commercial Managed Care - PPO

## 2016-01-25 ENCOUNTER — Other Ambulatory Visit (HOSPITAL_BASED_OUTPATIENT_CLINIC_OR_DEPARTMENT_OTHER): Payer: Commercial Managed Care - PPO

## 2016-01-25 ENCOUNTER — Ambulatory Visit (HOSPITAL_BASED_OUTPATIENT_CLINIC_OR_DEPARTMENT_OTHER): Payer: Commercial Managed Care - PPO | Admitting: Family

## 2016-01-25 ENCOUNTER — Encounter: Payer: Self-pay | Admitting: Family

## 2016-01-25 VITALS — BP 134/87 | HR 94 | Temp 98.3°F | Resp 18 | Ht 70.0 in | Wt 174.0 lb

## 2016-01-25 DIAGNOSIS — C8195 Hodgkin lymphoma, unspecified, lymph nodes of inguinal region and lower limb: Secondary | ICD-10-CM

## 2016-01-25 DIAGNOSIS — Z9484 Stem cells transplant status: Secondary | ICD-10-CM

## 2016-01-25 DIAGNOSIS — Z5112 Encounter for antineoplastic immunotherapy: Secondary | ICD-10-CM

## 2016-01-25 DIAGNOSIS — M6281 Muscle weakness (generalized): Secondary | ICD-10-CM

## 2016-01-25 LAB — CMP (CANCER CENTER ONLY)
ALT(SGPT): 25 U/L (ref 10–47)
AST: 24 U/L (ref 11–38)
Albumin: 4 g/dL (ref 3.3–5.5)
Alkaline Phosphatase: 72 U/L (ref 26–84)
BUN: 20 mg/dL (ref 7–22)
CHLORIDE: 103 meq/L (ref 98–108)
CO2: 28 meq/L (ref 18–33)
CREATININE: 1.3 mg/dL — AB (ref 0.6–1.2)
Calcium: 9.4 mg/dL (ref 8.0–10.3)
Glucose, Bld: 141 mg/dL — ABNORMAL HIGH (ref 73–118)
Potassium: 4.3 mEq/L (ref 3.3–4.7)
SODIUM: 138 meq/L (ref 128–145)
TOTAL PROTEIN: 6.5 g/dL (ref 6.4–8.1)
Total Bilirubin: 0.7 mg/dl (ref 0.20–1.60)

## 2016-01-25 LAB — CBC WITH DIFFERENTIAL (CANCER CENTER ONLY)
BASO#: 0 10*3/uL (ref 0.0–0.2)
BASO%: 0.5 % (ref 0.0–2.0)
EOS ABS: 0.1 10*3/uL (ref 0.0–0.5)
EOS%: 0.8 % (ref 0.0–7.0)
HCT: 38.9 % (ref 38.7–49.9)
HGB: 13.4 g/dL (ref 13.0–17.1)
LYMPH#: 0.8 10*3/uL — ABNORMAL LOW (ref 0.9–3.3)
LYMPH%: 12.8 % — AB (ref 14.0–48.0)
MCH: 32.8 pg (ref 28.0–33.4)
MCHC: 34.4 g/dL (ref 32.0–35.9)
MCV: 95 fL (ref 82–98)
MONO#: 0.7 10*3/uL (ref 0.1–0.9)
MONO%: 10.7 % (ref 0.0–13.0)
NEUT#: 4.6 10*3/uL (ref 1.5–6.5)
NEUT%: 75.2 % (ref 40.0–80.0)
PLATELETS: 219 10*3/uL (ref 145–400)
RBC: 4.08 10*6/uL — AB (ref 4.20–5.70)
RDW: 12.7 % (ref 11.1–15.7)
WBC: 6.1 10*3/uL (ref 4.0–10.0)

## 2016-01-25 LAB — LACTATE DEHYDROGENASE: LDH: 183 U/L (ref 125–245)

## 2016-01-25 MED ORDER — NIVOLUMAB CHEMO INJECTION 100 MG/10ML
240.0000 mg | Freq: Once | INTRAVENOUS | Status: AC
  Administered 2016-01-25: 240 mg via INTRAVENOUS
  Filled 2016-01-25: qty 8

## 2016-01-25 MED ORDER — HEPARIN SOD (PORK) LOCK FLUSH 100 UNIT/ML IV SOLN
500.0000 [IU] | Freq: Once | INTRAVENOUS | Status: AC | PRN
  Administered 2016-01-25: 500 [IU]
  Filled 2016-01-25: qty 5

## 2016-01-25 MED ORDER — SODIUM CHLORIDE 0.9 % IJ SOLN
10.0000 mL | INTRAMUSCULAR | Status: DC | PRN
  Administered 2016-01-25: 10 mL
  Filled 2016-01-25: qty 10

## 2016-01-25 MED ORDER — SODIUM CHLORIDE 0.9 % IV SOLN
Freq: Once | INTRAVENOUS | Status: AC
  Administered 2016-01-25: 15:00:00 via INTRAVENOUS

## 2016-01-25 NOTE — Patient Instructions (Signed)
Nivolumab injection What is this medicine? NIVOLUMAB (nye VOL ue mab) is a monoclonal antibody. It is used to treat melanoma, lung cancer, kidney cancer, and Hodgkin lymphoma. This medicine may be used for other purposes; ask your health care provider or pharmacist if you have questions. What should I tell my health care provider before I take this medicine? They need to know if you have any of these conditions: -diabetes -immune system problems -kidney disease -liver disease -lung disease -organ transplant -stomach or intestine problems -thyroid disease -an unusual or allergic reaction to nivolumab, other medicines, foods, dyes, or preservatives -pregnant or trying to get pregnant -breast-feeding How should I use this medicine? This medicine is for infusion into a vein. It is given by a health care professional in a hospital or clinic setting. A special MedGuide will be given to you before each treatment. Be sure to read this information carefully each time. Talk to your pediatrician regarding the use of this medicine in children. Special care may be needed. Overdosage: If you think you have taken too much of this medicine contact a poison control center or emergency room at once. NOTE: This medicine is only for you. Do not share this medicine with others. What if I miss a dose? It is important not to miss your dose. Call your doctor or health care professional if you are unable to keep an appointment. What may interact with this medicine? Interactions have not been studied. Give your health care provider a list of all the medicines, herbs, non-prescription drugs, or dietary supplements you use. Also tell them if you smoke, drink alcohol, or use illegal drugs. Some items may interact with your medicine. This list may not describe all possible interactions. Give your health care provider a list of all the medicines, herbs, non-prescription drugs, or dietary supplements you use. Also tell  them if you smoke, drink alcohol, or use illegal drugs. Some items may interact with your medicine. What should I watch for while using this medicine? This drug may make you feel generally unwell. Continue your course of treatment even though you feel ill unless your doctor tells you to stop. You may need blood work done while you are taking this medicine. Do not become pregnant while taking this medicine or for 5 months after stopping it. Women should inform their doctor if they wish to become pregnant or think they might be pregnant. There is a potential for serious side effects to an unborn child. Talk to your health care professional or pharmacist for more information. Do not breast-feed an infant while taking this medicine. What side effects may I notice from receiving this medicine? Side effects that you should report to your doctor or health care professional as soon as possible: -allergic reactions like skin rash, itching or hives, swelling of the face, lips, or tongue -black, tarry stools -blood in the urine -bloody or watery diarrhea -changes in vision -change in sex drive -changes in emotions or moods -chest pain -confusion -cough -decreased appetite -diarrhea -facial flushing -feeling faint or lightheaded -fever, chills -hair loss -hallucination, loss of contact with reality -headache -irritable -joint pain -loss of memory -muscle pain -muscle weakness -seizures -shortness of breath -signs and symptoms of high blood sugar such as dizziness; dry mouth; dry skin; fruity breath; nausea; stomach pain; increased hunger or thirst; increased urination -signs and symptoms of kidney injury like trouble passing urine or change in the amount of urine -signs and symptoms of liver injury like dark yellow or   brown urine; general ill feeling or flu-like symptoms; light-colored stools; loss of appetite; nausea; right upper belly pain; unusually weak or tired; yellowing of the eyes or  skin -stiff neck -swelling of the ankles, feet, hands -weight gain Side effects that usually do not require medical attention (report to your doctor or health care professional if they continue or are bothersome): -bone pain -constipation -tiredness -vomiting This list may not describe all possible side effects. Call your doctor for medical advice about side effects. You may report side effects to FDA at 1-800-FDA-1088. Where should I keep my medicine? This drug is given in a hospital or clinic and will not be stored at home. NOTE: This sheet is a summary. It may not cover all possible information. If you have questions about this medicine, talk to your doctor, pharmacist, or health care provider.    2016, Elsevier/Gold Standard. (2015-04-13 10:03:42)  

## 2016-01-26 NOTE — Progress Notes (Signed)
Hematology and Oncology Follow Up Visit  JORGELUIS MUKES [id number] September 15, 1961 55 y.o. 01/26/2016   Principle Diagnosis:  Recurrent Hodgkin's Disease - S/p autologous transplant- awaiting start in clinical trial with Nash Dimmer  Current Therapy:   Nivolumab q 14 days s/p cycle 10    Interim History:  Mr. Twiford is here today for follow-up and treatment. He had T-cells collected at [facility] on January 31st. He is now waiting to hear back from them as to when he will begin a clinical trial with CAR-T cells targeting the CD30 antigen. At this time, Dr. Marin Olp has not been notified as to when they plan to start.  No fever, chills, n/v, cough, rash, dizziness, SOB, chest pain, palpitations, abdominal pain or changes in bowel or bladder habits. No lymphadenopathy found on exam. No episodes of bleeding or bruising.  No swelling of his extremities. He has severe neuropathy of the feet which has caused him to "shuffle" when he walks. He has also noticed that his legs are becoming weak. He has tried to stay active walking and at times jogging when he can.  He does not wish to try Neurontin but is up for trying physical therapy for strengthening.  Unfortunately, he is under some stress at home which adds to his anxiety. He is on Celexa daily and also has ativan as needed for anxiety and xanax to help with sleep.  He is eating well and staying hydrated. His weight is unchanged.   Medications:    Medication List       This list is accurate as of: 01/25/16 11:59 PM.  Always use your most recent med list.               acetaminophen 325 MG tablet  Commonly known as:  TYLENOL  Take 325 mg by mouth every 6 (six) hours as needed for mild pain.     ALPRAZolam 0.5 MG tablet  Commonly known as:  XANAX  take 1 tablet by mouth at bedtime if needed for sleep     aspirin 81 MG chewable tablet  Chew 1 tablet (81 mg total) by mouth daily.     citalopram 20 MG tablet  Commonly known as:   CELEXA  take 1 tablet by mouth once daily     lidocaine-prilocaine cream  Commonly known as:  EMLA  Apply 1 application topically as needed. Apply nickel sized amount to portacath site at least 1 hour prior to chemotherapy.     LORazepam 1 MG tablet  Commonly known as:  ATIVAN  TAKE 1/2 A TABLET BY MOUTH TWICE DAILY DURING THE DAY FOR ANXIETY, AND 1 TABLET AT BEDTIME IF NEEDED     multivitamin tablet  Take 1 tablet by mouth daily.     RA VITAMIN B-12 TR 1000 MCG Tbcr  Generic drug:  Cyanocobalamin  Take by mouth.     Vitamin D-3 1000 units Caps  Take 1 capsule by mouth daily.        Allergies: No Known Allergies  Past Medical History, Surgical history, Social history, and Family History were reviewed and updated.  Review of Systems: All other 10 point review of systems is negative.   Physical Exam:  height is 5\' 10"  (1.778 m) and weight is 174 lb (78.926 kg). His oral temperature is 98.3 F (36.8 C). His blood pressure is 134/87 and his pulse is 94. His respiration is 18.   Wt Readings from Last 3 Encounters:  01/25/16 174 lb (  78.926 kg)  01/11/16 175 lb (79.379 kg)  12/28/15 176 lb (79.833 kg)    Ocular: Sclerae unicteric, pupils equal, round and reactive to light Ear-nose-throat: Oropharynx clear, dentition fair Lymphatic: No cervical supraclavicular or axillary adenopathy Lungs no rales or rhonchi, good excursion bilaterally Heart regular rate and rhythm, no murmur appreciated Abd soft, nontender, positive bowel sounds, no liver or spleen tip palpated on exam MSK no focal spinal tenderness, no joint edema Neuro: non-focal, well-oriented, appropriate affect Breasts: Deferred  Lab Results  Component Value Date   WBC 6.1 01/25/2016   HGB 13.4 01/25/2016   HCT 38.9 01/25/2016   MCV 95 01/25/2016   PLT 219 01/25/2016   Lab Results  Component Value Date   FERRITIN 29 10/15/2012   IRON 71 10/15/2012   TIBC 370 10/15/2012   UIBC 299 10/15/2012   IRONPCTSAT  19* 10/15/2012   Lab Results  Component Value Date   RETICCTPCT 1.3 10/15/2012   RBC 4.08* 01/25/2016   RETICCTABS 63.8 10/15/2012   No results found for: KPAFRELGTCHN, LAMBDASER, KAPLAMBRATIO No results found for: IGGSERUM, IGA, IGMSERUM No results found for: Kathrynn Ducking, MSPIKE, SPEI   Chemistry      Component Value Date/Time   NA 138 01/25/2016 1315   NA 137 07/30/2015 0329   NA 142 03/25/2013 0828   K 4.3 01/25/2016 1315   K 5.1 07/30/2015 0329   K 4.1 03/25/2013 0828   CL 103 01/25/2016 1315   CL 103 07/30/2015 0329   CL 107 03/25/2013 0828   CO2 28 01/25/2016 1315   CO2 29 07/30/2015 0329   CO2 27 03/25/2013 0828   BUN 20 01/25/2016 1315   BUN 22* 07/30/2015 0329   BUN 12.7 03/25/2013 0828   CREATININE 1.3* 01/25/2016 1315   CREATININE 1.33* 07/30/2015 0329   CREATININE 0.9 03/25/2013 0828      Component Value Date/Time   CALCIUM 9.4 01/25/2016 1315   CALCIUM 8.9 07/30/2015 0329   CALCIUM 9.2 03/25/2013 0828   ALKPHOS 72 01/25/2016 1315   ALKPHOS 50 07/30/2015 0329   ALKPHOS 122 03/25/2013 0828   AST 24 01/25/2016 1315   AST 33 07/30/2015 0329   AST 33 03/25/2013 0828   ALT 25 01/25/2016 1315   ALT 28 07/30/2015 0329   ALT 65* 03/25/2013 0828   BILITOT 0.70 01/25/2016 1315   BILITOT 0.6 07/30/2015 0329   BILITOT 0.31 03/25/2013 0828     Impression and Plan: Mr. Neels is a 55 yo gentleman with a history of recurrent Hodgkin's disease. He underwent a autologous stem cell transplant back in May of 2014 and then had a recurrence after this. He is tolerating Nivolumab nicely.  He had T-cells retreived at [facility] on January 31st and is waiting to hear from them as to when he will start the clinical trial. Dr. Marin Olp has not been notified yet either.  We will proceed with treatment today as planned.  He has his current treatment and appointment schedule. At this point, we are just waiting to hear from [facility].   He will contact us with any questions or concerns. We can certainly see him sooner if need be.   Eliezer Bottom, NP 3/2/20179:37 AM

## 2016-02-02 ENCOUNTER — Encounter: Payer: Self-pay | Admitting: Rehabilitative and Restorative Service Providers"

## 2016-02-02 ENCOUNTER — Ambulatory Visit: Payer: Commercial Managed Care - PPO | Attending: Family | Admitting: Rehabilitative and Restorative Service Providers"

## 2016-02-02 DIAGNOSIS — R269 Unspecified abnormalities of gait and mobility: Secondary | ICD-10-CM | POA: Diagnosis not present

## 2016-02-02 DIAGNOSIS — Z7409 Other reduced mobility: Secondary | ICD-10-CM | POA: Insufficient documentation

## 2016-02-02 DIAGNOSIS — R2681 Unsteadiness on feet: Secondary | ICD-10-CM | POA: Diagnosis present

## 2016-02-02 NOTE — Patient Instructions (Signed)
Trunk: Prone Extension (Press-Ups)    Lie on stomach on firm, flat surface. Relax bottom and legs. Raise chest in air with elbows straight. Keep hips flat on surface, sag stomach. Hold __2-3__ seconds. Repeat _10___ times. Do __2__ sessions per day. CAUTION: Movement should be gentle and slow.   Without Support    Stand on one leg in neutral spine without support. Hold __several __ seconds. Repeat on other leg. Do _several___ repetitions   Trunk Flexion    Standing on one leg, bend forward from hips. Touch chair, then return, keeping back straight and leg bent. Hold each position __2-3__ seconds. Repeat on other leg. Do __10__ repetitions     Various tactile stimuli for feet  Massage  Touch and rub with smooth and various rough textures  Ice massage using a frozen water bottle  Tap/pinch/tickle   Contrast bath - see written instructions

## 2016-02-02 NOTE — Therapy (Addendum)
[facility] [address]  [place], [place], [address] Phone: [phone]   Fax:  [phone]  Physical Therapy Evaluation  Patient Details  Name: Brian [name] MRN: [id number] Date of Birth: 01-26-1961 Referring Provider: Laverna Peace, NP  Encounter Date: 02/02/2016      PT End of Session - 02/02/16 1734    Visit Number 1   Number of Visits 6   Date for PT Re-Evaluation 03/15/16   PT Start Time 0417   PT Stop Time 0511   PT Time Calculation (min) 54 min   Activity Tolerance Patient tolerated treatment well      Past Medical History  Diagnosis Date  . Wears contact lenses   . H/O autologous stem cell transplant ([place])     may 2014  at [facility]  . History of peptic ulcer   . Mass of right inguinal region   . History of Bell's palsy     2009  RIGHT SIDE--  HAS 80% FUNCTION / PT STATES A LITTLE ASYMETRICAL AND EFFECTS MOUTH  . Hodgkin's disease, nodular sclerosis, of inguinal region/lower limb ([place]) ONOLOGIST--  DR [facility]NEVER AND A [facility]      [place] CHEMO 2013/  AUTOLOGUS STEM CELL TRANSPLANT MAY 2014 AT [facility]  . Dysrhythmia     past hx pvc  . Anxiety   . PVC (premature ventricular contraction)     "benign"  . TIA (transient ischemic attack) 02/2015    "probable TIA"    Past Surgical History  Procedure Laterality Date  . Axillary lymph node biopsy Left 02/02/2013    Procedure: NEEDLE LOCALIZED AXILLARY LYMPH NODE BIOPSY;  Surgeon: Haywood Lasso, MD;  Location: [facility];  Service: General;  Laterality: Left;  . Cyst removal neck Left 1980  . Removal right inguinal lymph nodes  08-16-2011  . Left inguinal lymph node bx  09-09-2012  . Transthoracic echocardiogram  03-18-2013    MILD LVH/  EF 55-60%  . Scrotal exploration Right 01/04/2014    Procedure: SCROTUM EXPLORATION   INGUINAL , EXCISION OF CYSTIC MASS OF RIGHT SPERMATIC CORD, WITH FROZEN SECTION;  Surgeon: Franchot Gallo, MD;   [facility];  Service: Urology;  Laterality: Right;  . Pleura biopsy Left 05/31/2014  . Video assisted thoracoscopy (vats)/wedge resection  05/31/2014  . Bone marrow biopsy  08/2012  . Video assisted thoracoscopy Left 05/31/2014    Procedure: LEFT VIDEO ASSISTED THORACOSCOPY, PLEURAL BIOPSY;  Surgeon: Melrose Nakayama, MD;  Location: [facility];  Service: Thoracic;  Laterality: Left;  . Video assisted thoracoscopy (vats)/ lymph node sampling Right 07/28/2015  . Video bronchoscopy with endobronchial ultrasound  07/28/2015  . Video bronchoscopy with endobronchial ultrasound N/A 07/28/2015    Procedure: VIDEO BRONCHOSCOPY WITH ENDOBRONCHIAL ULTRASOUND;  Surgeon: Melrose Nakayama, MD;  Location: [facility];  Service: Thoracic;  Laterality: N/A;  . Lymph node biopsy N/A 07/28/2015    Procedure: LYMPH NODE BIOPSY;  Surgeon: Melrose Nakayama, MD;  Location: [facility];  Service: Thoracic;  Laterality: N/A;  . Video assisted thoracoscopy Right 07/28/2015    Procedure: RIGHT VIDEO ASSISTED THORACOSCOPY;  Surgeon: Melrose Nakayama, MD;  Location: [place];  Service: Thoracic;  Laterality: Right;  . Node dissection Right 07/28/2015    Procedure: NODE DISSECTION;  Surgeon: Melrose Nakayama, MD;  Location: [facility];  Service: Thoracic;  Laterality: Right;    There were no vitals filed for this visit.  Visit  Diagnosis:  Abnormal gait - Plan: PT plan of care cert/re-cert  Unsteadiness - Plan: PT plan of care cert/re-cert  Impaired functional mobility, balance, gait, and endurance - Plan: PT plan of care cert/re-cert      Subjective Assessment - 02/02/16 1620    Subjective Has Hodgkin's Disease for 3 1/2 years with three rounds of chemotherapy. Last round of therapy left him with neuropathy of bilat distal feet and fingers. He now has shuffling gait pattern and walks with altered gait pattern. He feels wobbly and unsteady at times - has strength but notices some unsteadiness with change in  positons quickly. Scuffing Lt > Rt foot;  intermittent foot cramps   Pertinent History Bell's [place]y Rt  with 80-90% recovery; possible TIA 04/16 with slurred speech for ~3 min - no deficits per MRI; benign cyst neck 30 yrs ago. Stem cell treatment 3 yrs ago with good response; recurrent CA x2; chest biopsy(thorcotomy)  on either side of chest 2015  and again 2016    Patient Stated Goals see if there is anything he can do to improve the sensation in his feet; and wants to walk better    Currently in Pain? No/denies            [facility] PT Assessment - 02/02/16 0001    Assessment   Medical Diagnosis Hodgkin's disease;generalized weakness    Referring Provider Laverna Peace, NP   Onset Date/Surgical Date 09/10/15   Hand Dominance Right   Next MD Visit 02/08/16  OPDIVO every 2 wks since 10/16   Prior Therapy none   Precautions   Precautions None   Balance Screen   Has the patient fallen in the past 6 months No   Has the patient had a decrease in activity level because of a fear of falling?  No   Is the patient reluctant to leave their home because of a fear of falling?  No   Prior Function   Level of Independence Independent   Vocation Full time employment   Furniture conservator/restorer for other companies    Leisure walking 4 x/wk 20-60 min; plying with kids in [address] 60 and 37 yr old    Observation/Other Assessments   Focus on Therapeutic Outcomes (FOTO)  23% limitation    Sensation   Additional Comments numbness and tingling distal 1/3rd of bilat feet; some abnormal    Posture/Postural Control   Posture Comments some head forward posture which increases with gait    AROM   Overall AROM Comments WFL's throughout    Strength   Overall Strength Comments 5/5 bilat LE's   Flexibility   Hamstrings tight bilat ~65-70 deg    Ambulation/Gait   Gait Comments some flexed forward posture; scuff of feet Lt>Rt    Standardized Balance Assessment   Balance  Master Testing --  Rt 49 sec; LT 16 sec SLS                   [facility] Adult PT Treatment/Exercise - 02/02/16 0001    Therapeutic Activites    Therapeutic Activities --  education in desentization bilat feet; contrast bath feet   Neuro Re-ed    Neuro Re-ed Details  education re importance in proper posture and alignment    Exercises   Other Exercises  SLS each LE; SLS with fwd bend to touch chair x 10 each LE    Lumbar Exercises: Stretches   Press Ups --  2-3 sec hold 10  reps                PT Education - 02/02/16 1710    Education provided Yes   Education Details balance and alignment; HEP; desentization for feet    Person(s) Educated Patient   Methods Demonstration;Explanation;Tactile cues;Verbal cues;Handout   Comprehension Verbalized understanding;Returned demonstration;Verbal cues required;Tactile cues required             PT Long Term Goals - 02/02/16 1739    PT LONG TERM GOAL #1   Title Instruct pt in desentization program for neuropathy 02/02/16   Time 1   Period Days   Status Achieved   PT LONG TERM GOAL #2   Title Instruct pt in balance and agility activities to add to home exercise routine 03/15/16   Time 6   Period Weeks   Status New   PT LONG TERM GOAL #3   Title I in HEP 03/15/16   Time 6   Period Weeks   Status New   PT LONG TERM GOAL #4   Title Improve FOTO to </= 20% limitation 03/15/16   Time 6   Period Weeks   Status New               Plan - 02/02/16 1735    Clinical Impression Statement Brian [name] presents with concerns re his gait and the neuropathy in his feet. He has good mobility; ROM; strength throughout. He has some abnormal posture and gait as well as higher level balance deficits. Patient will benefit form PT to address higher level balance deficits.    Pt will benefit from skilled therapeutic intervention in order to improve on the following deficits Postural dysfunction;Improper body mechanics;Abnormal gait;Decreased  endurance;Decreased balance;Decreased activity tolerance   Rehab Potential Good   PT Frequency 1x / week   PT Duration 6 weeks   PT Treatment/Interventions Neuromuscular re-education;Patient/family education;ADLs/Self Care Home Management;Therapeutic exercise;Therapeutic activities;Balance training;Gait training   PT Next Visit Plan continue assessment of balance defitcits; work on posture and alignment with gait; work on balance and agility tasks with instruction in activities for home    PT [place] for feet/thoracic area; balance activites for HEP    Consulted and Agree with Plan of Care Patient         Problem List Patient Active Problem List   Diagnosis Date Noted  . Lymphadenopathy 07/28/2015  . Mediastinal adenopathy 07/14/2015  . Aphasia 03/04/2015  . Slurred speech 03/04/2015  . Pleural mass 05/31/2014  . Abdominal pain, epigastric 02/02/2014  . Weight loss 08/12/2012  . H/O Bell's palsy 08/12/2012  . Hodgkin's disease 08/09/2011    Angelito Hopping Nilda Simmer PT, MPH 02/02/2016, 5:44 PM  [facility] [address]  [place], [place], [address] Phone: [phone]   Fax:  [phone]  Name: Brian [name] MRN: [id number] Date of Birth: Mar 14, 1961   PHYSICAL THERAPY DISCHARGE SUMMARY  Visits from Start of Care: Eval only  Current functional level related to goals / functional outcomes: unchanged   Remaining deficits: unchanged   Education / Equipment: HEP Plan: Patient agrees to discharge.  Patient goals were not met. Patient is being discharged due to not returning since the last visit.  ?????   Khaleel Beckom P. Helene Kelp PT, MPH 02/21/2016 8:12 AM

## 2016-02-08 ENCOUNTER — Other Ambulatory Visit (HOSPITAL_BASED_OUTPATIENT_CLINIC_OR_DEPARTMENT_OTHER): Payer: Commercial Managed Care - PPO

## 2016-02-08 ENCOUNTER — Ambulatory Visit (HOSPITAL_BASED_OUTPATIENT_CLINIC_OR_DEPARTMENT_OTHER): Payer: Commercial Managed Care - PPO | Admitting: Hematology & Oncology

## 2016-02-08 ENCOUNTER — Ambulatory Visit (HOSPITAL_BASED_OUTPATIENT_CLINIC_OR_DEPARTMENT_OTHER): Payer: Commercial Managed Care - PPO

## 2016-02-08 VITALS — BP 114/68 | HR 84 | Temp 98.0°F | Resp 20 | Wt 174.0 lb

## 2016-02-08 DIAGNOSIS — M6281 Muscle weakness (generalized): Secondary | ICD-10-CM

## 2016-02-08 DIAGNOSIS — C8195 Hodgkin lymphoma, unspecified, lymph nodes of inguinal region and lower limb: Secondary | ICD-10-CM

## 2016-02-08 DIAGNOSIS — Z9484 Stem cells transplant status: Secondary | ICD-10-CM | POA: Diagnosis not present

## 2016-02-08 DIAGNOSIS — Z5112 Encounter for antineoplastic immunotherapy: Secondary | ICD-10-CM

## 2016-02-08 LAB — CMP (CANCER CENTER ONLY)
ALBUMIN: 3.8 g/dL (ref 3.3–5.5)
ALK PHOS: 62 U/L (ref 26–84)
ALT: 28 U/L (ref 10–47)
AST: 28 U/L (ref 11–38)
BILIRUBIN TOTAL: 1.1 mg/dL (ref 0.20–1.60)
BUN, Bld: 18 mg/dL (ref 7–22)
CALCIUM: 9.4 mg/dL (ref 8.0–10.3)
CO2: 29 meq/L (ref 18–33)
Chloride: 105 mEq/L (ref 98–108)
Creat: 1 mg/dl (ref 0.6–1.2)
Glucose, Bld: 124 mg/dL — ABNORMAL HIGH (ref 73–118)
Potassium: 4.2 mEq/L (ref 3.3–4.7)
Sodium: 139 mEq/L (ref 128–145)
Total Protein: 6.6 g/dL (ref 6.4–8.1)

## 2016-02-08 LAB — CBC WITH DIFFERENTIAL (CANCER CENTER ONLY)
BASO#: 0 10*3/uL (ref 0.0–0.2)
BASO%: 0.5 % (ref 0.0–2.0)
EOS%: 1.3 % (ref 0.0–7.0)
Eosinophils Absolute: 0.1 10*3/uL (ref 0.0–0.5)
HEMATOCRIT: 39.4 % (ref 38.7–49.9)
HGB: 13.6 g/dL (ref 13.0–17.1)
LYMPH#: 0.6 10*3/uL — AB (ref 0.9–3.3)
LYMPH%: 10.6 % — ABNORMAL LOW (ref 14.0–48.0)
MCH: 32.8 pg (ref 28.0–33.4)
MCHC: 34.5 g/dL (ref 32.0–35.9)
MCV: 95 fL (ref 82–98)
MONO#: 0.5 10*3/uL (ref 0.1–0.9)
MONO%: 9.2 % (ref 0.0–13.0)
NEUT%: 78.4 % (ref 40.0–80.0)
NEUTROS ABS: 4.4 10*3/uL (ref 1.5–6.5)
Platelets: 199 10*3/uL (ref 145–400)
RBC: 4.15 10*6/uL — ABNORMAL LOW (ref 4.20–5.70)
RDW: 12.7 % (ref 11.1–15.7)
WBC: 5.6 10*3/uL (ref 4.0–10.0)

## 2016-02-08 LAB — LACTATE DEHYDROGENASE: LDH: 159 U/L (ref 125–245)

## 2016-02-08 MED ORDER — SODIUM CHLORIDE 0.9 % IV SOLN
Freq: Once | INTRAVENOUS | Status: AC
  Administered 2016-02-08: 14:00:00 via INTRAVENOUS

## 2016-02-08 MED ORDER — NIVOLUMAB CHEMO INJECTION 100 MG/10ML
240.0000 mg | Freq: Once | INTRAVENOUS | Status: AC
  Administered 2016-02-08: 240 mg via INTRAVENOUS
  Filled 2016-02-08: qty 20

## 2016-02-08 MED ORDER — SODIUM CHLORIDE 0.9 % IJ SOLN
10.0000 mL | INTRAMUSCULAR | Status: DC | PRN
  Administered 2016-02-08: 10 mL
  Filled 2016-02-08: qty 10

## 2016-02-08 MED ORDER — SODIUM CHLORIDE 0.9 % IJ SOLN
3.0000 mL | INTRAMUSCULAR | Status: DC | PRN
  Filled 2016-02-08: qty 10

## 2016-02-08 MED ORDER — HEPARIN SOD (PORK) LOCK FLUSH 100 UNIT/ML IV SOLN
250.0000 [IU] | Freq: Once | INTRAVENOUS | Status: DC | PRN
  Filled 2016-02-08: qty 5

## 2016-02-08 MED ORDER — HEPARIN SOD (PORK) LOCK FLUSH 100 UNIT/ML IV SOLN
500.0000 [IU] | Freq: Once | INTRAVENOUS | Status: AC | PRN
  Administered 2016-02-08: 500 [IU]
  Filled 2016-02-08: qty 5

## 2016-02-08 MED ORDER — ALTEPLASE 2 MG IJ SOLR
2.0000 mg | Freq: Once | INTRAMUSCULAR | Status: DC | PRN
  Filled 2016-02-08: qty 2

## 2016-02-08 NOTE — Progress Notes (Signed)
Hematology and Oncology Follow Up Visit  Brian [name] [id number] 1961/03/16 55 y.o. 02/08/2016   Principle Diagnosis:  Recurrent Hodgkin's Disease -  S/p autologous transplant  TIA-resolved  Current Therapy:     Nivolumab s/p c #11     Interim History:  [name] is in for followup. He is doing pretty good considering all the stress that he is under. He has a lot of issues going on in his personal life. Thank you, he is a very strong man and he will get through all of the other issues that are affecting him.  He still does not know when he will have the CAR-T cell therapy at [facility]. It sounds like it might be April before this happens.  Otherwise, he still has some neuropathy in his feet. Some of this might be from the past stroke that he had.  He has been getting some physical therapy.  He is still working. He is doing great at work. It's amazing how he is managing all the various issues in his life.  He's had no fever. He's had no congestion. He's had no cough. He's had no nausea or vomiting. His been no change in bowel or bladder habits.  Overall, his performance status is ECOG 1.   Medications:  Current outpatient prescriptions:  .  acetaminophen (TYLENOL) 325 MG tablet, Take 325 mg by mouth every 6 (six) hours as needed for mild pain., Disp: , Rfl:  .  ALPRAZolam (XANAX) 0.5 MG tablet, take 1 tablet by mouth at bedtime if needed for sleep, Disp: 30 tablet, Rfl: 2 .  aspirin 81 MG chewable tablet, Chew 1 tablet (81 mg total) by mouth daily., Disp: 30 tablet, Rfl: 0 .  Cholecalciferol (VITAMIN D-3) 1000 UNITS CAPS, Take 1 capsule by mouth daily. , Disp: , Rfl:  .  citalopram (CELEXA) 20 MG tablet, take 1 tablet by mouth once daily, Disp: 30 tablet, Rfl: 6 .  Cyanocobalamin (RA VITAMIN B-12 TR) 1000 MCG TBCR, Take by mouth., Disp: , Rfl:  .  lidocaine-prilocaine (EMLA) cream, Apply 1 application topically as needed. Apply nickel sized amount to portacath site at least 1  hour prior to chemotherapy., Disp: 30 g, Rfl: 1 .  LORazepam (ATIVAN) 1 MG tablet, TAKE 1/2 A TABLET BY MOUTH TWICE DAILY DURING THE DAY FOR ANXIETY, AND 1 TABLET AT BEDTIME IF NEEDED, Disp: 20 tablet, Rfl: 3 .  Multiple Vitamin (MULTIVITAMIN) tablet, Take 1 tablet by mouth daily., Disp: , Rfl:  No current facility-administered medications for this visit.  Facility-Administered Medications Ordered in Other Visits:  .  0.9 %  sodium chloride infusion, , Intravenous, Once, [name], MD .  alteplase (CATHFLO ACTIVASE) injection 2 mg, 2 mg, Intracatheter, Once PRN, [name], MD .  heparin lock flush 100 unit/mL, 500 Units, Intracatheter, Once PRN, [name], MD .  heparin lock flush 100 unit/mL, 250 Units, Intracatheter, Once PRN, [name], MD .  nivolumab (OPDIVO) 240 mg in sodium chloride 0.9 % 100 mL chemo infusion, 240 mg, Intravenous, Once, [name], MD .  sodium chloride 0.9 % injection 10 mL, 10 mL, Intracatheter, PRN, [name], MD .  sodium chloride 0.9 % injection 3 mL, 3 mL, Intravenous, PRN, [name], MD  Allergies: No Known Allergies  Past Medical History, Surgical history, Social history, and Family History were reviewed and updated.  Review of Systems: As above  Physical Exam:  weight is 174 lb (  78.926 kg). His oral temperature is 98 F (36.7 C). His blood pressure is 114/68 and his pulse is 84. His respiration is 20.   Well-developed and well-nourished white gentleman. Head and neck exam shows no ocular or oral lesions. There are no palpable cervical or supraclavicular lymph nodes. Lungs are clear. Cardiac exam regular rate and rhythm with no murmurs, rubs or bruits.. Abdomen is soft. He has good bowel sounds. There is no fluid wave. There is no palpable liver or spleen tip. Back exam shows a thoracoscopy site in the left lateral chest wall. This is healed. There is no swelling or erythema. Extremities shows no clubbing, cyanosis or  edema. Skin exam shows no rashes, ecchymoses or petechia . Neurological exam is nonfocal.  Lab Results  Component Value Date   WBC 5.6 02/08/2016   HGB 13.6 02/08/2016   HCT 39.4 02/08/2016   MCV 95 02/08/2016   PLT 199 02/08/2016     Chemistry      Component Value Date/Time   NA 139 02/08/2016 1117   NA 137 07/30/2015 0329   NA 142 03/25/2013 0828   K 4.2 02/08/2016 1117   K 5.1 07/30/2015 0329   K 4.1 03/25/2013 0828   CL 105 02/08/2016 1117   CL 103 07/30/2015 0329   CL 107 03/25/2013 0828   CO2 29 02/08/2016 1117   CO2 29 07/30/2015 0329   CO2 27 03/25/2013 0828   BUN 18 02/08/2016 1117   BUN 22* 07/30/2015 0329   BUN 12.7 03/25/2013 0828   CREATININE 1.0 02/08/2016 1117   CREATININE 1.33* 07/30/2015 0329   CREATININE 0.9 03/25/2013 0828      Component Value Date/Time   CALCIUM 9.4 02/08/2016 1117   CALCIUM 8.9 07/30/2015 0329   CALCIUM 9.2 03/25/2013 0828   ALKPHOS 62 02/08/2016 1117   ALKPHOS 50 07/30/2015 0329   ALKPHOS 122 03/25/2013 0828   AST 28 02/08/2016 1117   AST 33 07/30/2015 0329   AST 33 03/25/2013 0828   ALT 28 02/08/2016 1117   ALT 28 07/30/2015 0329   ALT 65* 03/25/2013 0828   BILITOT 1.10 02/08/2016 1117   BILITOT 0.6 07/30/2015 0329   BILITOT 0.31 03/25/2013 0828         Impression and Plan: Brian [name] is a 55 year old gentleman.  He has history of recurrent Hodgkin's disease. 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 He then had a recurrence after this.  So far, he's tolerated the Nivolumab well. 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 He also had that TIA which may have had some residual effect.  We will continue him on the Opdivo for right now.  I am sure [facility] less no when to stop the Opdivo so he can finally get into the trial.  We will plan to get back in 2 weeks.   Cassell Smiles, MD 3/15/20171:30 PM

## 2016-02-08 NOTE — Patient Instructions (Signed)
Nivolumab injection What is this medicine? NIVOLUMAB (nye VOL ue mab) is a monoclonal antibody. It is used to treat melanoma, lung cancer, kidney cancer, and Hodgkin lymphoma. This medicine may be used for other purposes; ask your health care provider or pharmacist if you have questions. What should I tell my health care provider before I take this medicine? They need to know if you have any of these conditions: -diabetes -immune system problems -kidney disease -liver disease -lung disease -organ transplant -stomach or intestine problems -thyroid disease -an unusual or allergic reaction to nivolumab, other medicines, foods, dyes, or preservatives -pregnant or trying to get pregnant -breast-feeding How should I use this medicine? This medicine is for infusion into a vein. It is given by a health care professional in a hospital or clinic setting. A special MedGuide will be given to you before each treatment. Be sure to read this information carefully each time. Talk to your pediatrician regarding the use of this medicine in children. Special care may be needed. Overdosage: If you think you have taken too much of this medicine contact a poison control center or emergency room at once. NOTE: This medicine is only for you. Do not share this medicine with others. What if I miss a dose? It is important not to miss your dose. Call your doctor or health care professional if you are unable to keep an appointment. What may interact with this medicine? Interactions have not been studied. Give your health care provider a list of all the medicines, herbs, non-prescription drugs, or dietary supplements you use. Also tell them if you smoke, drink alcohol, or use illegal drugs. Some items may interact with your medicine. This list may not describe all possible interactions. Give your health care provider a list of all the medicines, herbs, non-prescription drugs, or dietary supplements you use. Also tell  them if you smoke, drink alcohol, or use illegal drugs. Some items may interact with your medicine. What should I watch for while using this medicine? This drug may make you feel generally unwell. Continue your course of treatment even though you feel ill unless your doctor tells you to stop. You may need blood work done while you are taking this medicine. Do not become pregnant while taking this medicine or for 5 months after stopping it. Women should inform their doctor if they wish to become pregnant or think they might be pregnant. There is a potential for serious side effects to an unborn child. Talk to your health care professional or pharmacist for more information. Do not breast-feed an infant while taking this medicine. What side effects may I notice from receiving this medicine? Side effects that you should report to your doctor or health care professional as soon as possible: -allergic reactions like skin rash, itching or hives, swelling of the face, lips, or tongue -black, tarry stools -blood in the urine -bloody or watery diarrhea -changes in vision -change in sex drive -changes in emotions or moods -chest pain -confusion -cough -decreased appetite -diarrhea -facial flushing -feeling faint or lightheaded -fever, chills -hair loss -hallucination, loss of contact with reality -headache -irritable -joint pain -loss of memory -muscle pain -muscle weakness -seizures -shortness of breath -signs and symptoms of high blood sugar such as dizziness; dry mouth; dry skin; fruity breath; nausea; stomach pain; increased hunger or thirst; increased urination -signs and symptoms of kidney injury like trouble passing urine or change in the amount of urine -signs and symptoms of liver injury like dark yellow or   brown urine; general ill feeling or flu-like symptoms; light-colored stools; loss of appetite; nausea; right upper belly pain; unusually weak or tired; yellowing of the eyes or  skin -stiff neck -swelling of the ankles, feet, hands -weight gain Side effects that usually do not require medical attention (report to your doctor or health care professional if they continue or are bothersome): -bone pain -constipation -tiredness -vomiting This list may not describe all possible side effects. Call your doctor for medical advice about side effects. You may report side effects to FDA at 1-800-FDA-1088. Where should I keep my medicine? This drug is given in a hospital or clinic and will not be stored at home. NOTE: This sheet is a summary. It may not cover all possible information. If you have questions about this medicine, talk to your doctor, pharmacist, or health care provider.    2016, Elsevier/Gold Standard. (2015-04-13 10:03:42)  

## 2016-02-14 ENCOUNTER — Other Ambulatory Visit: Payer: Self-pay | Admitting: Hematology & Oncology

## 2016-02-14 ENCOUNTER — Telehealth: Payer: Self-pay | Admitting: *Deleted

## 2016-02-14 DIAGNOSIS — C8195 Hodgkin lymphoma, unspecified, lymph nodes of inguinal region and lower limb: Secondary | ICD-10-CM

## 2016-02-14 NOTE — Telephone Encounter (Signed)
Inge Rise Nurse coordinator for [facility] BMT unit called stating that they have collected patient T cells and will be giving them back on April 5.  In the meantime is not to get any chemotherapy here in our office and patient will need PET scan on 3/31 or 4/1 to restage him.  Jackelyn Poling number is [phone]

## 2016-02-15 ENCOUNTER — Ambulatory Visit: Payer: Commercial Managed Care - PPO | Admitting: Hematology & Oncology

## 2016-02-15 ENCOUNTER — Other Ambulatory Visit: Payer: Commercial Managed Care - PPO

## 2016-02-15 ENCOUNTER — Ambulatory Visit: Payer: Commercial Managed Care - PPO

## 2016-02-22 ENCOUNTER — Ambulatory Visit: Payer: Commercial Managed Care - PPO | Admitting: Hematology & Oncology

## 2016-02-22 ENCOUNTER — Ambulatory Visit: Payer: Commercial Managed Care - PPO

## 2016-02-22 ENCOUNTER — Other Ambulatory Visit: Payer: Commercial Managed Care - PPO

## 2016-02-24 ENCOUNTER — Ambulatory Visit (HOSPITAL_COMMUNITY)
Admission: RE | Admit: 2016-02-24 | Discharge: 2016-02-24 | Disposition: A | Discharge status: Home / Self Care | Payer: Commercial Managed Care - PPO | Source: Ambulatory Visit | Attending: Hematology & Oncology | Admitting: Hematology & Oncology

## 2016-02-24 DIAGNOSIS — C8195 Hodgkin lymphoma, unspecified, lymph nodes of inguinal region and lower limb: Secondary | ICD-10-CM | POA: Diagnosis not present

## 2016-02-24 LAB — GLUCOSE, CAPILLARY: Glucose-Capillary: 96 mg/dL (ref 65–99)

## 2016-02-24 MED ORDER — FLUDEOXYGLUCOSE F - 18 (FDG) INJECTION
8.7200 | Freq: Once | INTRAVENOUS | Status: AC | PRN
  Administered 2016-02-24: 8.72 via INTRAVENOUS

## 2016-03-07 ENCOUNTER — Ambulatory Visit: Payer: Commercial Managed Care - PPO | Admitting: Hematology & Oncology

## 2016-03-07 ENCOUNTER — Other Ambulatory Visit: Payer: Commercial Managed Care - PPO

## 2016-03-07 ENCOUNTER — Ambulatory Visit: Payer: Commercial Managed Care - PPO

## 2016-03-20 ENCOUNTER — Ambulatory Visit: Payer: Commercial Managed Care - PPO | Admitting: Cardiovascular Disease

## 2016-03-20 ENCOUNTER — Other Ambulatory Visit (HOSPITAL_COMMUNITY): Payer: Commercial Managed Care - PPO

## 2016-03-21 ENCOUNTER — Other Ambulatory Visit: Payer: Commercial Managed Care - PPO

## 2016-03-21 ENCOUNTER — Ambulatory Visit: Payer: Commercial Managed Care - PPO | Admitting: Hematology & Oncology

## 2016-03-21 ENCOUNTER — Ambulatory Visit: Payer: Commercial Managed Care - PPO

## 2016-03-28 ENCOUNTER — Other Ambulatory Visit: Payer: Self-pay | Admitting: Family

## 2016-03-28 DIAGNOSIS — C8195 Hodgkin lymphoma, unspecified, lymph nodes of inguinal region and lower limb: Secondary | ICD-10-CM

## 2016-03-31 ENCOUNTER — Other Ambulatory Visit: Payer: Self-pay | Admitting: Emergency Medicine

## 2016-04-03 ENCOUNTER — Other Ambulatory Visit: Payer: Self-pay | Admitting: Emergency Medicine

## 2016-04-09 ENCOUNTER — Other Ambulatory Visit: Payer: Self-pay | Admitting: Emergency Medicine

## 2016-04-09 DIAGNOSIS — F439 Reaction to severe stress, unspecified: Secondary | ICD-10-CM

## 2016-04-09 MED ORDER — LORAZEPAM 1 MG PO TABS: ORAL_TABLET | ORAL | Status: DC

## 2016-04-09 MED ORDER — ALPRAZOLAM 0.5 MG PO TABS: ORAL_TABLET | ORAL | Status: DC

## 2016-04-22 IMAGING — CT CT HEAD W/O CM
2 series · 16 of 30 positions shown, 20 images · non-contrast
Comparison: 06/12/2014 brain MR

CLINICAL DATA: Acute slurred speech today. History of Hodgkin's
lymphoma.

EXAM:
CT HEAD WITHOUT CONTRAST
TECHNIQUE: Contiguous axial images were obtained from the base of the skull
through the vertex without intravenous contrast.

[Series 2: head w/o · axial · non-contrast · 0.47mm/px · z∈[+1473,+1618]mm · 13 of 35 slices shown, 17 images]
[im 3/35  brain]
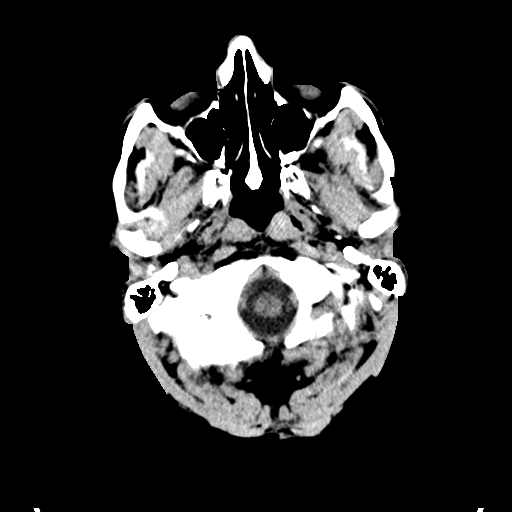
[im 3/35  bone]
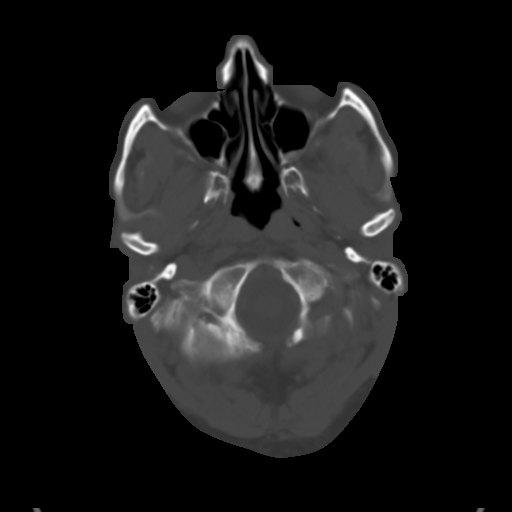
[im 5/35  brain]
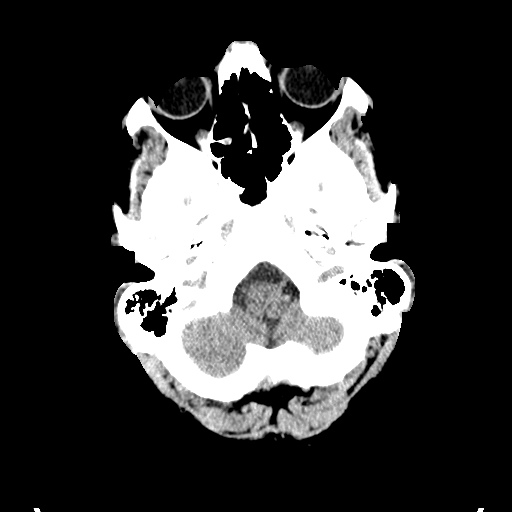
[im 8/35  brain]
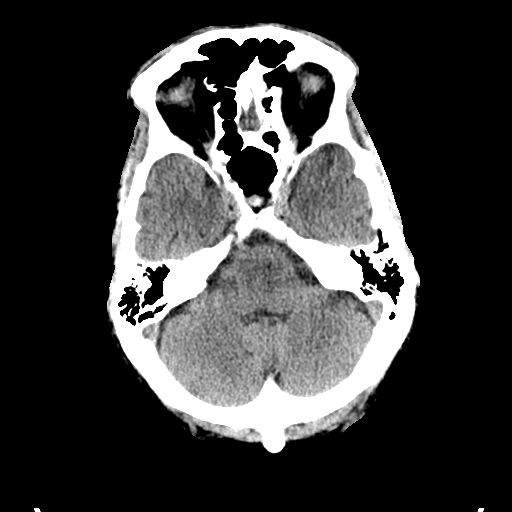
[im 10/35  brain]
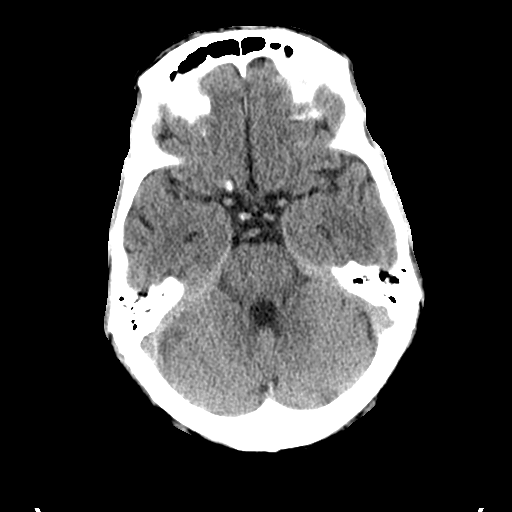
[im 13/35  brain]
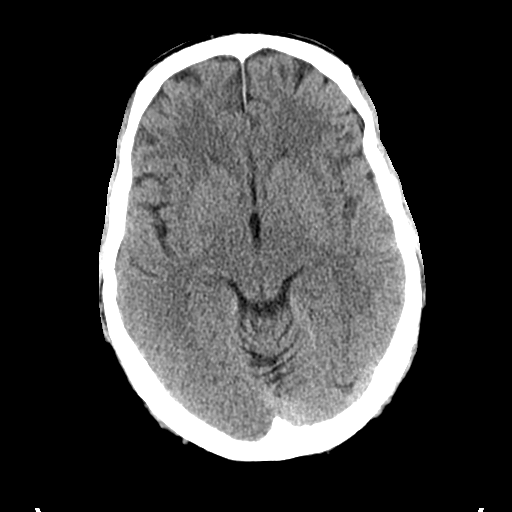
[im 13/35  bone]
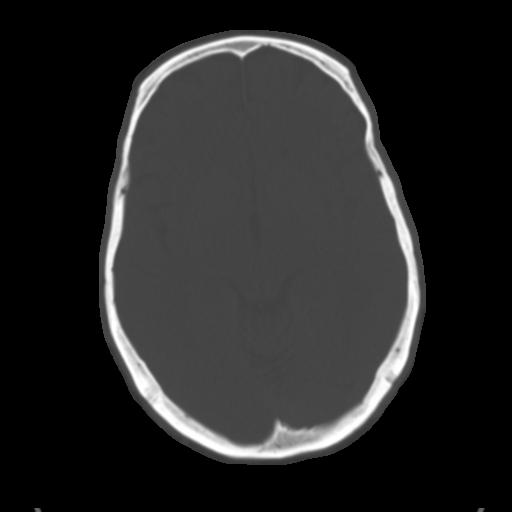
[im 15/35  brain]
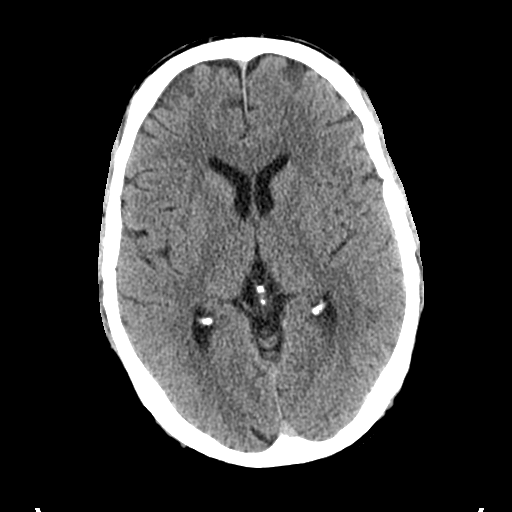
[im 18/35  brain]
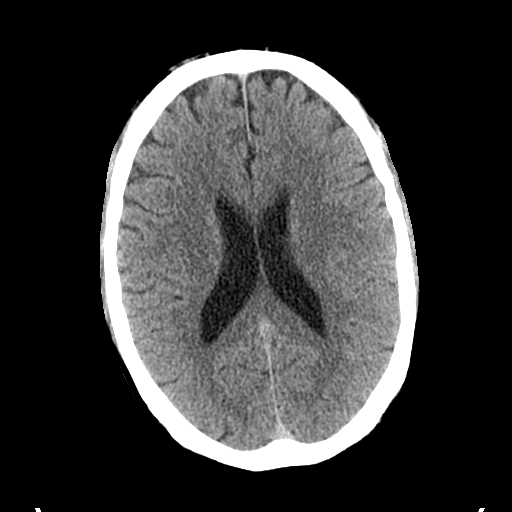
[im 20/35  brain]
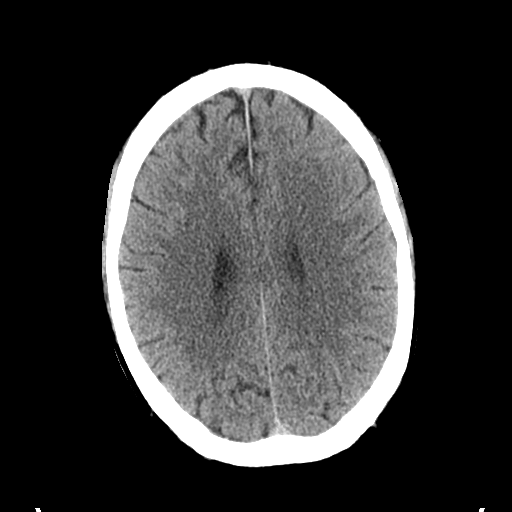
[im 22/35  brain]
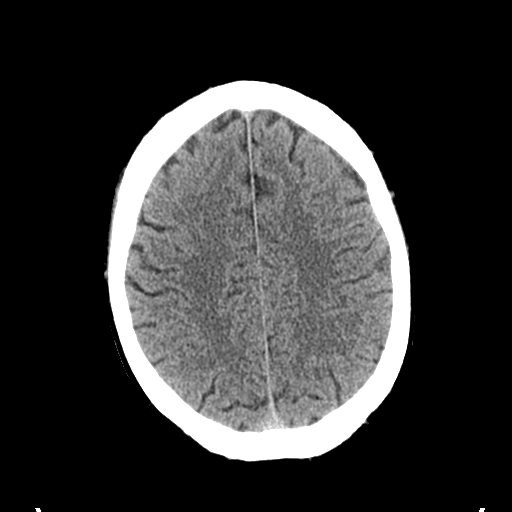
[im 22/35  bone]
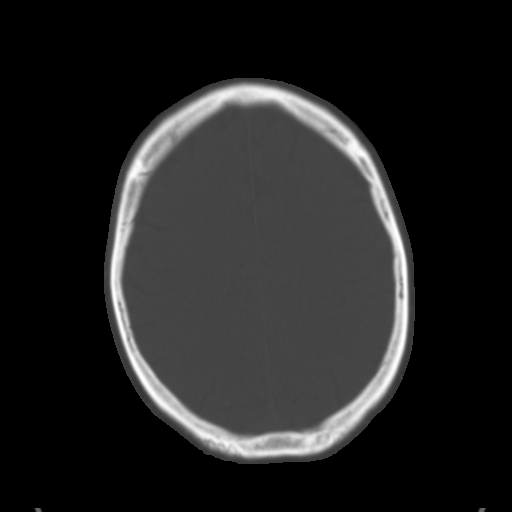
[im 25/35  brain]
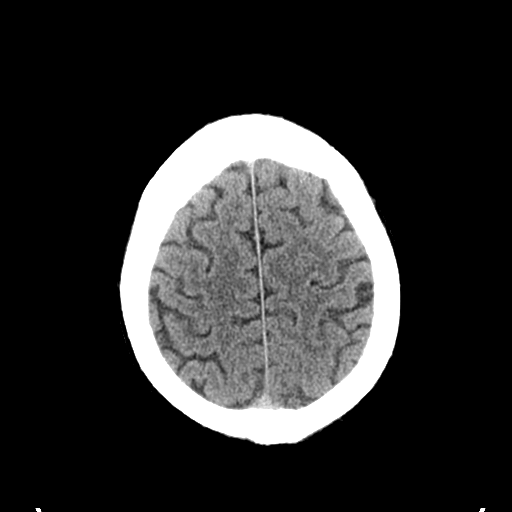
[im 27/35  brain]
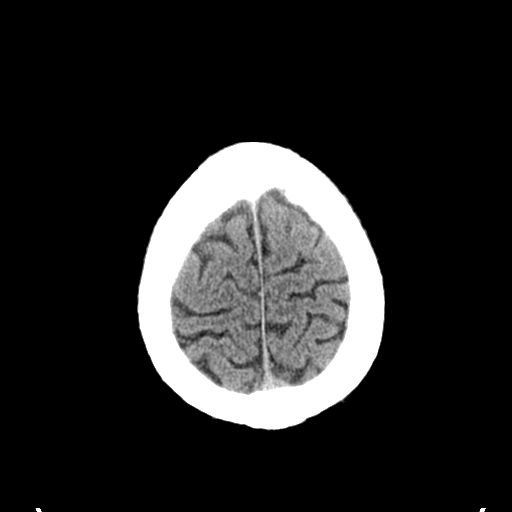
[im 30/35  brain]
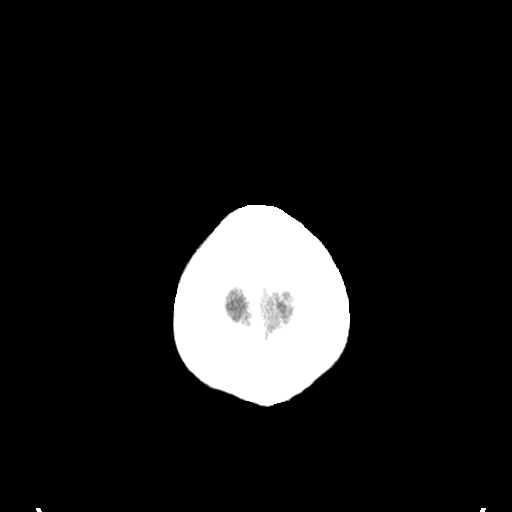
[im 32/35  brain]
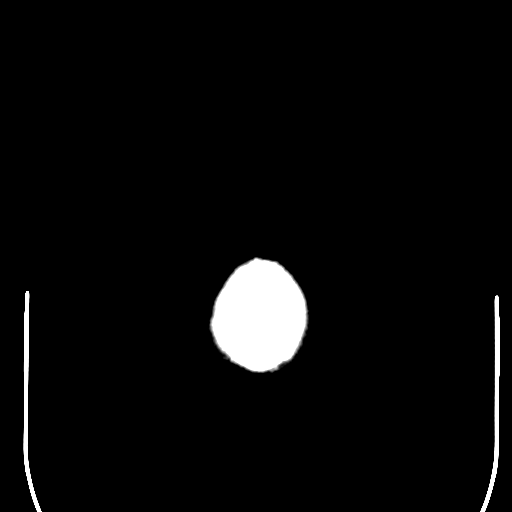
[im 32/35  bone]
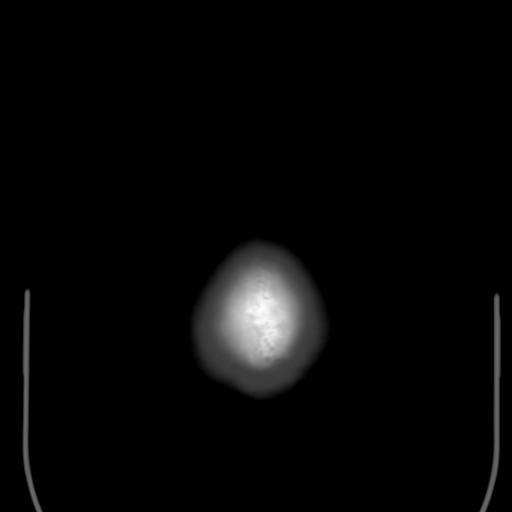

[Series 3: bone windows · axial · 0.47mm/px · z∈[+1473,+1523]mm · 3 of 35 slices shown]
[im 3/35  bone]
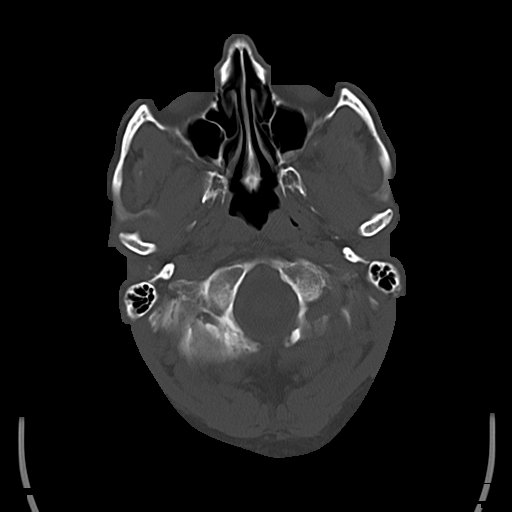
[im 8/35  bone]
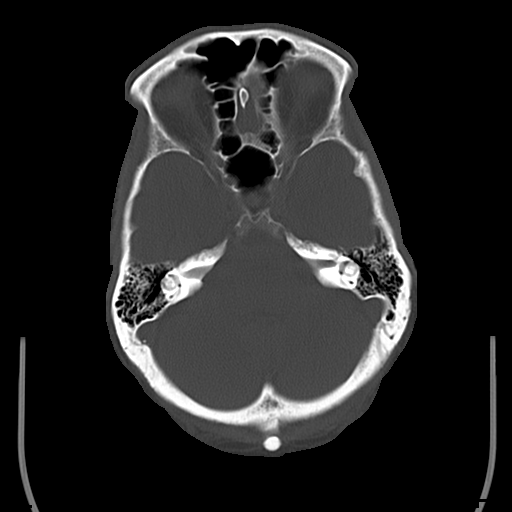
[im 13/35  bone]
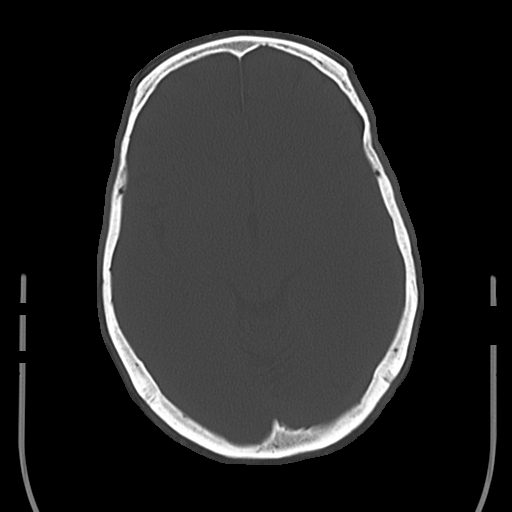

[16 of 30 positions shown; findings below may reference images not displayed]

FINDINGS: No intracranial abnormalities are identified, including mass lesion
or mass effect, hydrocephalus, extra-axial fluid collection, midline
shift, hemorrhage, or acute infarction.

The visualized bony calvarium is unremarkable.
IMPRESSION: Unremarkable noncontrast head CT.

## 2016-04-22 IMAGING — MR MR MRA HEAD W/O CM
9 of 11 series · 34 of 48 positions shown · non-contrast
Comparison: Prior CT from earlier the same day.

ADDENDUM:
Since the time of initial dictation, the time-of-flight sequence has
now become available for review. The intracranial MRA portion of
this exam remains normal. No high-grade stenosis or arterial
occlusion identified. No intracranial aneurysm.
CLINICAL DATA: Initial valuation for acute aphasia, now resolved.
History of Hodgkin's disease, Bell's palsy, PVCs.

EXAM:
MRI HEAD WITHOUT CONTRAST
MRA HEAD WITHOUT CONTRAST
TECHNIQUE: Multiplanar, multiecho pulse sequences of the brain and surrounding
structures were obtained without intravenous contrast. Angiographic
images of the head were obtained using MRA technique without
contrast.

[Series 3: T1 · sagittal · 5.0mm · 0.47mm/px · 1 of 24 slices shown]
[im 1/24]
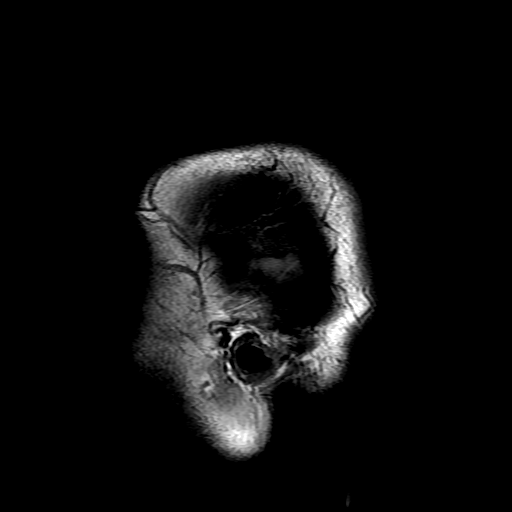

[Series 4: DWI · axial · 3.0mm · 1.09mm/px · z∈[-56,+96]mm · 11 of 156 slices shown (1 of 4)]
[im 1/156]
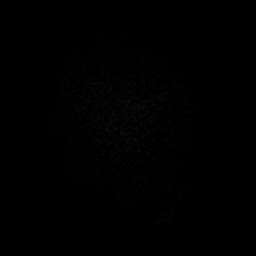
[im 16/156]
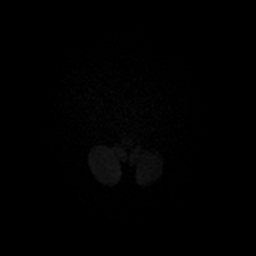
[im 32/156]
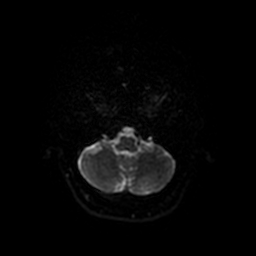
[im 47/156]
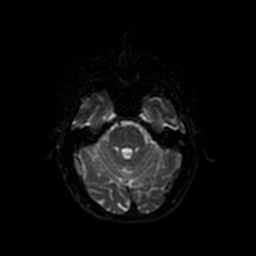
[im 63/156]
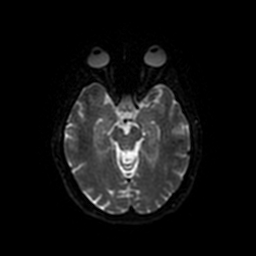
[im 78/156]
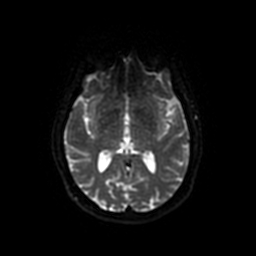
[im 94/156]
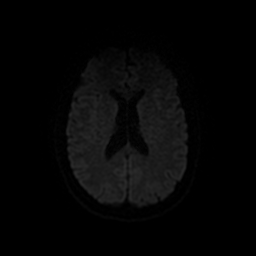
[im 109/156]
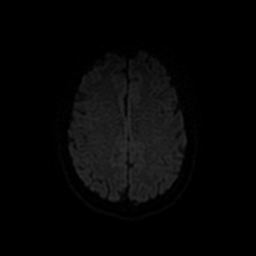
[im 125/156]
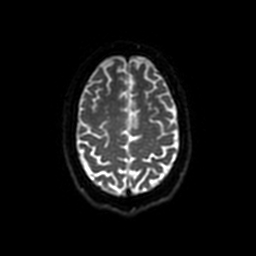
[im 140/156]
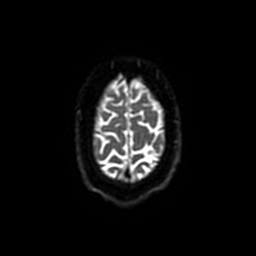
[im 156/156]
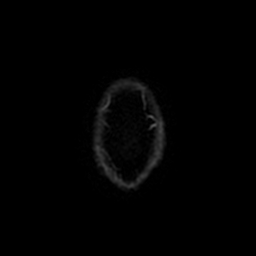

[Series 5: (id) mt fs · axial · 1.4mm · 0.39mm/px · z∈[-30,+5]mm · 3 of 157 slices shown]
[im 1/157]
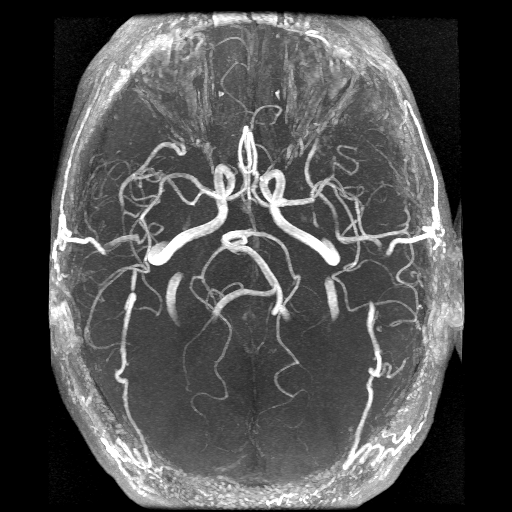
[im 32/157]
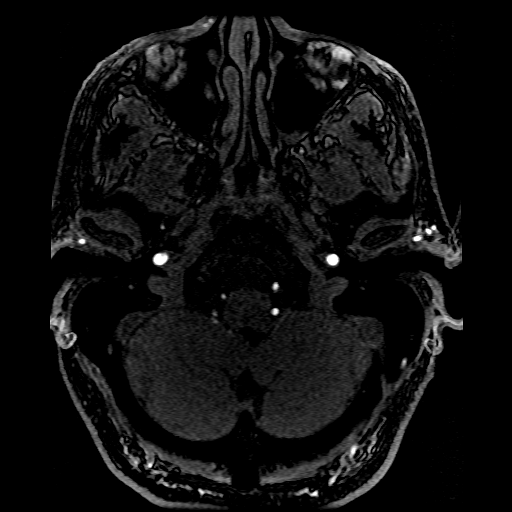
[im 47/157]
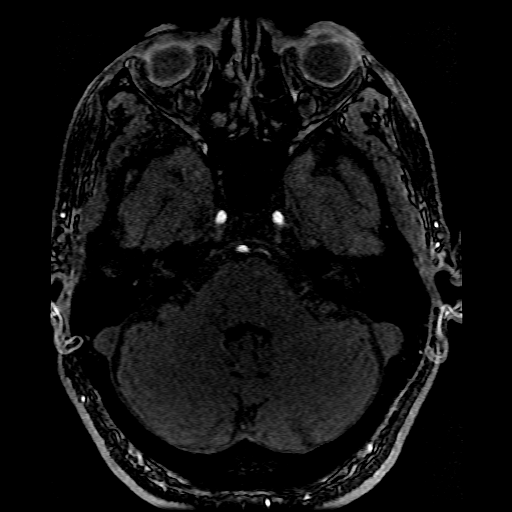

[Series 6: DWI · coronal · 5.0mm · 1.09mm/px · 6 of 80 slices shown (2 of 4)]
[im 1/80]
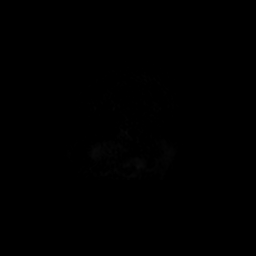
[im 16/80]
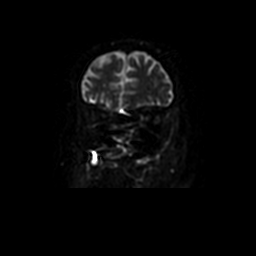
[im 32/80]
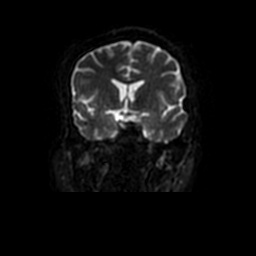
[im 48/80]
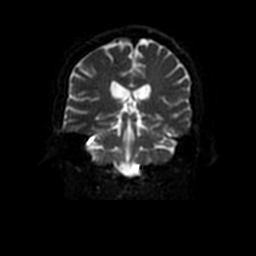
[im 64/80]
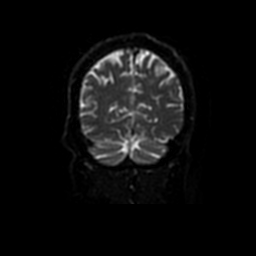
[im 80/80]
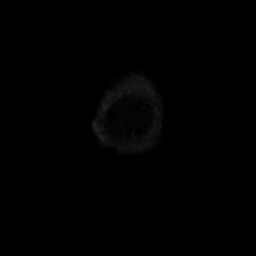

[Series 7: T2 · axial · 5.0mm · 0.43mm/px · z∈[-41,+108]mm · 2 of 24 slices shown (1 of 2)]
[im 1/24]
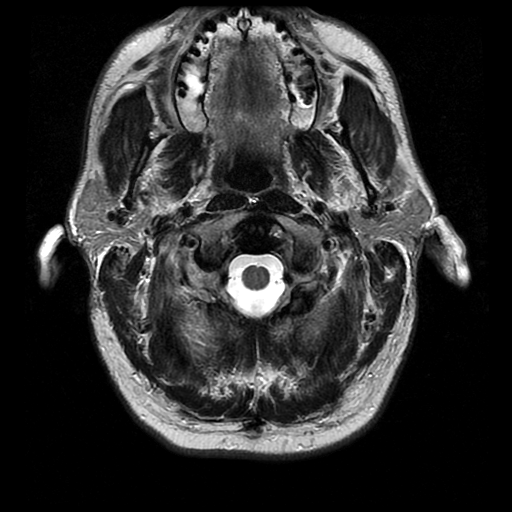
[im 24/24]
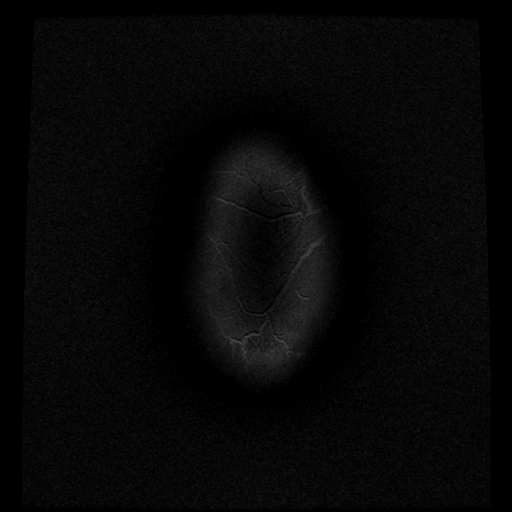

[Series 10: T2 · coronal · 5.0mm · 0.45mm/px · 2 of 32 slices shown (2 of 2)]
[im 1/32]
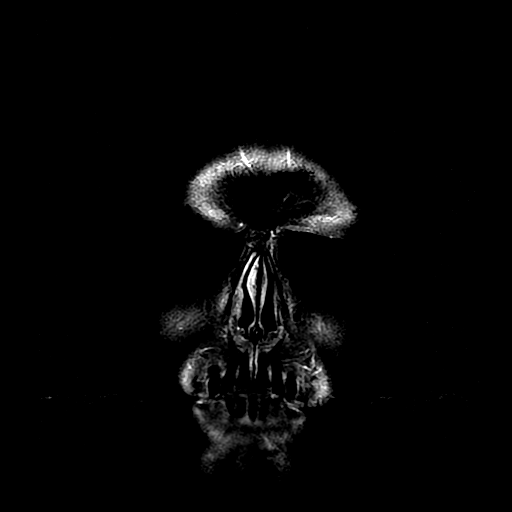
[im 32/32]
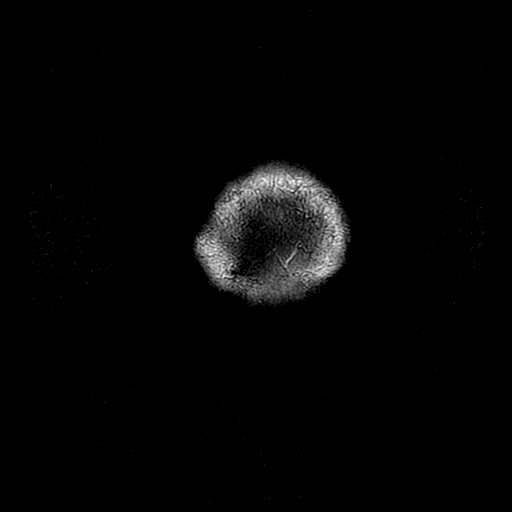

[Series 12: FLAIR · axial · 5.0mm · 0.43mm/px · z∈[-48,+111]mm · 2 of 24 slices shown]
[im 1/24]
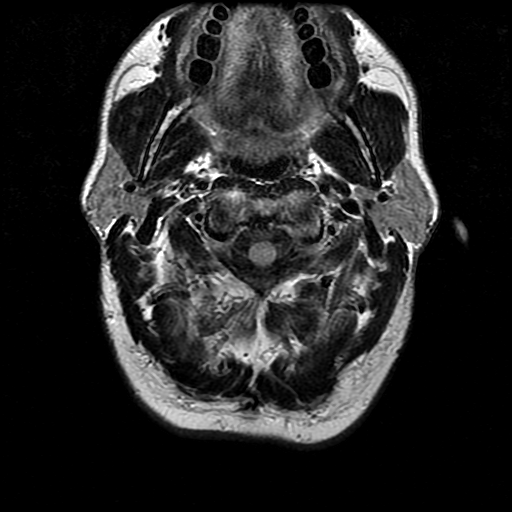
[im 24/24]
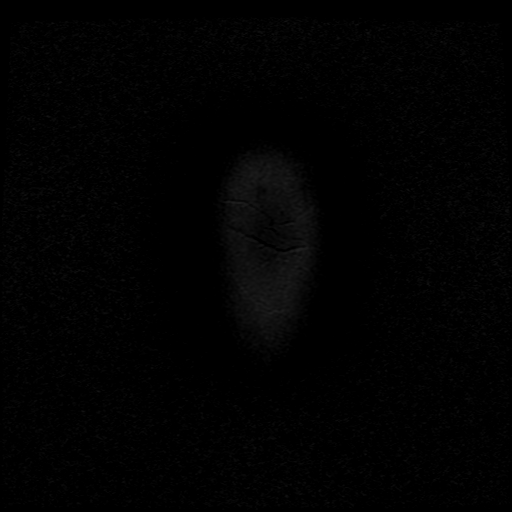

[Series 400: DWI · axial · 3.0mm · 1.09mm/px · z∈[-56,+96]mm · 4 of 52 slices shown (3 of 4)]
[im 1/52]
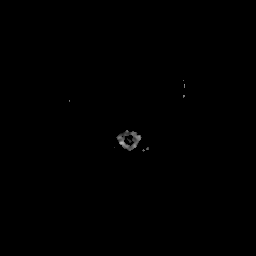
[im 18/52]
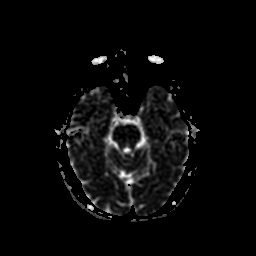
[im 35/52]
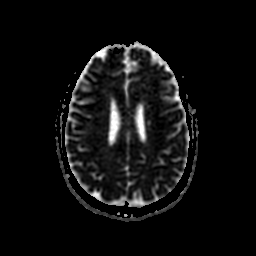
[im 52/52]
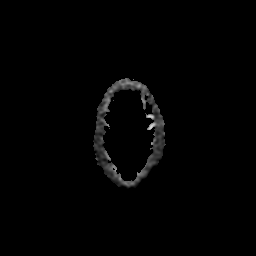

[Series 600: DWI · coronal · 5.0mm · 1.09mm/px · 3 of 40 slices shown (4 of 4)]
[im 1/40]
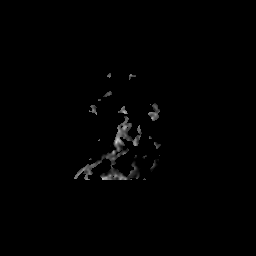
[im 20/40]
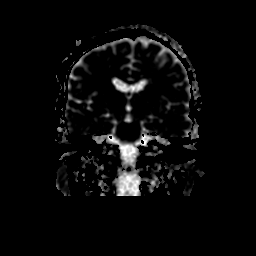
[im 40/40]
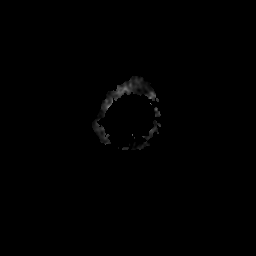

[34 of 48 positions shown; findings below may reference images not displayed]

FINDINGS: MRI HEAD FINDINGS

Cerebral volume within normal limits for patient age. Few scattered
T2/FLAIR hyperintense foci noted within the periventricular white
matter, nonspecific, but of doubtful clinical significance. No other
parenchymal signal abnormality.

No abnormal foci of restricted diffusion to suggest acute
intracranial infarct. Gray-white matter differentiation is
maintained. Normal intravascular flow voids are preserved. No acute
or chronic intracranial hemorrhage.

No mass lesion or midline shift. No hydrocephalus. No extra-axial
fluid collection.

Craniocervical junction within normal limits. Pituitary gland
unremarkable. No acute abnormality seen about the orbits.

Polypoid mucosal thickening present within the right maxillary
sinus. There is mild mucosal thickening within the left maxillary
sinus as well. No mastoid effusion.

Bone marrow signal intensity within normal limits. Scalp soft
tissues unremarkable.

MRA HEAD FINDINGS

ANTERIOR CIRCULATION:

Study is limited as the time-of-flight sequence is not available for
review at the time of the dictation.

There is widely patent flow within knee internal cerebral arteries
bilaterally without occlusion or high-grade stenosis. A1 segments
well opacified. Anterior cerebral arteries opacified and symmetric
bilaterally.

M1 segments widely patent without proximal branch occlusion or
stenosis. MCA bifurcations within normal limits. Distal MCA branches
unremarkable.

POSTERIOR CIRCULATION:

Vertebral arteries widely patent to the vertebrobasilar junction.
Right posterior inferior cerebral artery patent. Left posterior
inferior cerebral artery not visualized. Basilar artery widely
patent. Superior cerebral arteries patent proximally. Posterior
cerebral arteries widely patent bilaterally.

No definite aneurysm or vascular malformation.
IMPRESSION: MRI HEAD IMPRESSION:

Unremarkable brain MRI with no acute intracranial infarct or other
abnormality identified.

MRA HEAD IMPRESSION:

Limited study with no proximal branch occlusion or hemodynamically
significant stenosis identified. Please note that this study is
limited as the time-of-flight sequences are unavailable for viewing
at the time of this dictation. Once this sequence becomes available,
an addendum confirming these findings will be made.

## 2016-04-27 ENCOUNTER — Ambulatory Visit (HOSPITAL_COMMUNITY)
Admission: RE | Admit: 2016-04-27 | Discharge: 2016-04-27 | Disposition: A | Discharge status: Home / Self Care | Payer: Commercial Managed Care - PPO | Source: Ambulatory Visit | Attending: Family | Admitting: Family

## 2016-04-27 ENCOUNTER — Telehealth: Payer: Self-pay | Admitting: Family

## 2016-04-27 DIAGNOSIS — C8195 Hodgkin lymphoma, unspecified, lymph nodes of inguinal region and lower limb: Secondary | ICD-10-CM | POA: Insufficient documentation

## 2016-04-27 LAB — GLUCOSE, CAPILLARY: GLUCOSE-CAPILLARY: 105 mg/dL — AB (ref 65–99)

## 2016-04-27 MED ORDER — FLUDEOXYGLUCOSE F - 18 (FDG) INJECTION
8.7500 | Freq: Once | INTRAVENOUS | Status: AC | PRN
  Administered 2016-04-27: 8.75 via INTRAVENOUS

## 2016-04-27 NOTE — Telephone Encounter (Signed)
I spoke with Brian [name] and went over his PET scan results from this morning letting him know that there was no evidence of recurrent lymphoma. I also sent his report to Dr. Leonides Sake. [name], Dr. Marijo Conception. [name] and Dr. [name] with [name]. He follows up with Dr. Lonia Blood next week on June 7th.

## 2016-05-09 ENCOUNTER — Other Ambulatory Visit: Payer: Self-pay | Admitting: Hematology & Oncology

## 2016-05-09 DIAGNOSIS — C8195 Hodgkin lymphoma, unspecified, lymph nodes of inguinal region and lower limb: Secondary | ICD-10-CM

## 2016-05-15 ENCOUNTER — Other Ambulatory Visit: Payer: Self-pay | Admitting: Radiology

## 2016-05-16 ENCOUNTER — Other Ambulatory Visit (HOSPITAL_COMMUNITY): Payer: Commercial Managed Care - PPO

## 2016-05-16 ENCOUNTER — Ambulatory Visit (HOSPITAL_COMMUNITY): Payer: Commercial Managed Care - PPO

## 2016-05-17 ENCOUNTER — Other Ambulatory Visit: Payer: Self-pay | Admitting: Radiology

## 2016-05-18 ENCOUNTER — Encounter (HOSPITAL_COMMUNITY): Payer: Self-pay

## 2016-05-18 ENCOUNTER — Ambulatory Visit (HOSPITAL_COMMUNITY)
Admission: RE | Admit: 2016-05-18 | Discharge: 2016-05-18 | Disposition: A | Discharge status: Home / Self Care | Payer: Commercial Managed Care - PPO | Source: Ambulatory Visit | Attending: Hematology & Oncology | Admitting: Hematology & Oncology

## 2016-05-18 DIAGNOSIS — C8195 Hodgkin lymphoma, unspecified, lymph nodes of inguinal region and lower limb: Secondary | ICD-10-CM | POA: Insufficient documentation

## 2016-05-18 DIAGNOSIS — Z452 Encounter for adjustment and management of vascular access device: Secondary | ICD-10-CM | POA: Diagnosis present

## 2016-05-18 LAB — BASIC METABOLIC PANEL
ANION GAP: 6 (ref 5–15)
BUN: 25 mg/dL — ABNORMAL HIGH (ref 6–20)
CALCIUM: 9.5 mg/dL (ref 8.9–10.3)
CO2: 26 mmol/L (ref 22–32)
Chloride: 108 mmol/L (ref 101–111)
Creatinine, Ser: 1.01 mg/dL (ref 0.61–1.24)
Glucose, Bld: 99 mg/dL (ref 65–99)
Potassium: 4.2 mmol/L (ref 3.5–5.1)
SODIUM: 140 mmol/L (ref 135–145)

## 2016-05-18 LAB — CBC
HCT: 38.3 % — ABNORMAL LOW (ref 39.0–52.0)
HEMOGLOBIN: 13.2 g/dL (ref 13.0–17.0)
MCH: 31.8 pg (ref 26.0–34.0)
MCHC: 34.5 g/dL (ref 30.0–36.0)
MCV: 92.3 fL (ref 78.0–100.0)
PLATELETS: 249 10*3/uL (ref 150–400)
RBC: 4.15 MIL/uL — AB (ref 4.22–5.81)
RDW: 13 % (ref 11.5–15.5)
WBC: 5 10*3/uL (ref 4.0–10.5)

## 2016-05-18 LAB — PROTIME-INR
INR: 1.05 (ref 0.00–1.49)
PROTHROMBIN TIME: 13.9 s (ref 11.6–15.2)

## 2016-05-18 LAB — APTT: APTT: 23 s — AB (ref 24–37)

## 2016-05-18 MED ORDER — LIDOCAINE HCL 1 % IJ SOLN
INTRAMUSCULAR | Status: DC | PRN
  Administered 2016-05-18: 5 mL

## 2016-05-18 MED ORDER — CEFAZOLIN SODIUM-DEXTROSE 2-4 GM/100ML-% IV SOLN
2.0000 g | INTRAVENOUS | Status: AC
  Administered 2016-05-18: 2 g via INTRAVENOUS
  Filled 2016-05-18 (×2): qty 100

## 2016-05-18 MED ORDER — LIDOCAINE-EPINEPHRINE (PF) 2 %-1:200000 IJ SOLN
INTRAMUSCULAR | Status: DC | PRN
  Administered 2016-05-18: 10 mL

## 2016-05-18 MED ORDER — LIDOCAINE HCL 1 % IJ SOLN
INTRAMUSCULAR | Status: AC
  Filled 2016-05-18: qty 20

## 2016-05-18 MED ORDER — SODIUM CHLORIDE 0.9 % IV SOLN
Freq: Once | INTRAVENOUS | Status: AC
  Administered 2016-05-18: 14:00:00 via INTRAVENOUS

## 2016-05-18 NOTE — Procedures (Signed)
Successful RT IJ PORT REMOVAL NO COMP STABLE FULL REPORT IN PACS

## 2016-05-18 NOTE — Discharge Instructions (Signed)
Incision Care ° An incision (cut) is when a surgeon cuts into your body. After surgery, the cut needs to be well cared for to keep it from getting infected.  °HOW TO CARE FOR YOUR CUT °· Take medicines only as told by your doctor. °· There are many different ways to close and cover a cut, including stitches, skin glue, and adhesive strips. Follow your doctor's instructions on: °¨ Care of the cut. °¨ Bandage (dressing) changes and removal. °¨ Cut closure removal. °· Do not take baths, swim, or use a hot tub until your doctor says it is okay. You may shower as told by your doctor. °· Return to your normal diet and activities as allowed by your doctor. °· Use medicine that helps lessen itching on your cut as told by your doctor. Do not pick or scratch at your cut. °· Drink enough fluids to keep your pee (urine) clear or pale yellow. °GET HELP IF: °· You have redness, puffiness (swelling), or pain at the site of your cut. °· You have fluid, blood, or pus coming from your cut. °· Your muscles ache. °· You have chills or you feel sick. °· You have a bad smell coming from the cut or bandage. °· Your cut opens up after stitches, staples, or adhesive strips have been removed. °· You keep feeling sick to your stomach (nauseous) or keep throwing up (vomiting). °· You have a fever. °· You are dizzy. °GET HELP RIGHT AWAY IF: °· You have a rash. °· You pass out (faint). °· You have trouble breathing. °MAKE SURE YOU:  °· Understand these instructions. °· Will watch your condition. °· Will get help right away if you are not doing well or get worse. °  °This information is not intended to replace advice given to you by your health care provider. Make sure you discuss any questions you have with your health care provider. °  °Document Released: 02/04/2012 Document Revised: 12/03/2014 Document Reviewed: 01/06/2014 °Elsevier Interactive Patient Education ©2016 [employer] Inc. ° °

## 2016-06-27 ENCOUNTER — Ambulatory Visit (INDEPENDENT_AMBULATORY_CARE_PROVIDER_SITE_OTHER): Payer: Commercial Managed Care - PPO | Admitting: Emergency Medicine

## 2016-06-27 VITALS — BP 122/72 | HR 72 | Temp 98.2°F | Resp 17 | Ht 71.0 in | Wt 171.0 lb

## 2016-06-27 DIAGNOSIS — R251 Tremor, unspecified: Secondary | ICD-10-CM | POA: Diagnosis not present

## 2016-06-27 DIAGNOSIS — R269 Unspecified abnormalities of gait and mobility: Secondary | ICD-10-CM

## 2016-06-27 DIAGNOSIS — G620 Drug-induced polyneuropathy: Secondary | ICD-10-CM | POA: Diagnosis not present

## 2016-06-27 NOTE — Patient Instructions (Signed)
     IF you received an x-ray today, you will receive an invoice from [facility]. Please contact [facility] at [phone] with questions or concerns regarding your invoice.   IF you received labwork today, you will receive an invoice from [facility]. Please contact [facility] at [phone] with questions or concerns regarding your invoice.   Our billing staff will not be able to assist you with questions regarding bills from these companies.  You will be contacted with the lab results as soon as they are available. The fastest way to get your results is to activate your My Chart account. Instructions are located on the last page of this paperwork. If you have not heard from us regarding the results in 2 weeks, please contact this office.      

## 2016-06-27 NOTE — Addendum Note (Signed)
Addended by: Candie Chroman D on: 06/27/2016 03:10 PM   Modules accepted: Orders

## 2016-06-27 NOTE — Progress Notes (Signed)
Patient ID: Brian [name], male   DOB: 04/02/1961, 55 y.o.   MRN: [id number]    By signing my name below, I, Essence Howell, attest that this documentation has been prepared under the direction and in the presence of Darlyne Russian, MD Electronically Signed: Ladene Artist, ED Scribe 06/27/2016 at 10:38 AM.  Chief Complaint:  Chief Complaint  Patient presents with  . Other    foot numbness  . Advice Only   HPI: Brian [name] is a 55 y.o. male, with a h/o Hodgkin's disease and TIA, who reports to [facility] today complaining of gradually worsening numbness in his feet first noticed towards the end of last year, worsened over the past 3-4 months. Pt states that he has started to shuffle his feet with ambulating and is very unsteady in his gait. However, he has noticed that shuffling improves with ambulating up stairs and jogging. Pt denies lower extremity pain. He has also noticed that his handwriting has diminished over the past few months, stating that his handwriting feels forced and has gotten smaller. Pt denies tremors in his upper extremities. He has also reports occasional hoarseness during the day while speaking. He states that he has had 3 relapses since stem cell transplants; states he was hospitalized with a fever for 5 days following his infusion in April. He stopped his medications, had a clean PET scan and his next PET scan is in the end of the year. Pt wonders if symptoms are attributed to when he had his T-cells removed in January and replaced in April. Pt also expresses concerns of Parkinson's Disease. He reports a previous possible TIA in April 2016 following 2 minutes of impaired speech. Pt has seen once by Marcial Pacas, MD with [facility] in April 2016.  Pt was separated for 2 years but is now divorced. He recently switched counselors. His children are doing well overall and are now 54 and 56 y.o. He spends 4 nights/week with his children.   Past Medical History:  Diagnosis Date  .  Anxiety   . Dysrhythmia    past hx pvc  . H/O autologous stem cell transplant ([place])    may 2014  at [facility]  . History of Bell's palsy    2009  RIGHT SIDE--  HAS 80% FUNCTION / PT STATES A LITTLE ASYMETRICAL AND EFFECTS MOUTH  . History of peptic ulcer   . Hodgkin's disease, nodular sclerosis, of inguinal region/lower limb ([place]) ONOLOGIST--  DR ENNEVER AND A [facility]     [place] CHEMO 2013/  AUTOLOGUS STEM CELL TRANSPLANT MAY 2014 AT [facility]  . Mass of right inguinal region   . PVC (premature ventricular contraction)    "benign"  . TIA (transient ischemic attack) 02/2015   "probable TIA"  . Wears contact lenses    Past Surgical History:  Procedure Laterality Date  . AXILLARY LYMPH NODE BIOPSY Left 02/02/2013   Procedure: NEEDLE LOCALIZED AXILLARY LYMPH NODE BIOPSY;  Surgeon: Haywood Lasso, MD;  Location: [place];  Service: General;  Laterality: Left;  . BONE MARROW BIOPSY  08/2012  . CYST REMOVAL NECK Left 1980  . LEFT INGUINAL LYMPH NODE BX  09-09-2012  . LYMPH NODE BIOPSY N/A 07/28/2015   Procedure: LYMPH NODE BIOPSY;  Surgeon: Melrose Nakayama, MD;  Location: [place];  Service: Thoracic;  Laterality: N/A;  . NODE DISSECTION Right 07/28/2015   Procedure: NODE DISSECTION;  Surgeon: Melrose Nakayama, MD;  Location: [place];  Service: Thoracic;  Laterality: Right;  . PLEURA BIOPSY Left 05/31/2014  . REMOVAL RIGHT INGUINAL LYMPH NODES  08-16-2011  . 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 TRANSTHORACIC ECHOCARDIOGRAM  03-18-2013   MILD LVH/  EF 55-60%  . VIDEO ASSISTED THORACOSCOPY Left 05/31/2014   Procedure: LEFT VIDEO ASSISTED THORACOSCOPY, PLEURAL BIOPSY;  Surgeon: Melrose Nakayama, MD;  Location: [place];  Service: Thoracic;  Laterality: Left;  Marland Kitchen  VIDEO ASSISTED THORACOSCOPY Right 07/28/2015   Procedure: RIGHT VIDEO ASSISTED THORACOSCOPY;  Surgeon: Melrose Nakayama, MD;  Location: [place];  Service: Thoracic;  Laterality: Right;  Marland Kitchen VIDEO ASSISTED THORACOSCOPY (VATS)/ LYMPH NODE SAMPLING Right 07/28/2015  . VIDEO ASSISTED THORACOSCOPY (VATS)/WEDGE RESECTION  05/31/2014  . VIDEO BRONCHOSCOPY WITH ENDOBRONCHIAL ULTRASOUND  07/28/2015  . VIDEO BRONCHOSCOPY WITH ENDOBRONCHIAL ULTRASOUND N/A 07/28/2015   Procedure: VIDEO BRONCHOSCOPY WITH ENDOBRONCHIAL ULTRASOUND;  Surgeon: Melrose Nakayama, MD;  Location: [place];  Service: Thoracic;  Laterality: N/A;   Social History   Social History  . Marital status: Legally Separated    Spouse name: N/A  . Number of children: N/A  . Years of education: N/A   Occupational History  . Arboriculturist Communication   Social History Main Topics  . Smoking status: Never Smoker  . Smokeless tobacco: Never Used  . Alcohol use 4.8 oz/week    4 Cans of beer, 4 Glasses of wine per week  . Drug use: No  . Sexual activity: Not Currently   Other Topics Concern  . None   Social History Narrative   Married. Education: [employer]. Exercise: jog/walk/ lift weights 7 days a week, 1-2 miles.   Family History  Problem Relation Age of Onset  . Cancer Mother 42    ovarian  . Heart disease Father   . Stroke Father   . Cancer Sister     ovarian  . Cancer Brother     testicular   No Known Allergies Prior to Admission medications   Medication Sig Start Date End Date Taking? Authorizing Provider  acetaminophen (TYLENOL) 325 MG tablet Take 325 mg by mouth every 6 (six) hours as needed for mild pain.   Yes Historical Provider, MD  ALPRAZolam Duanne Moron) 0.5 MG tablet take 1 tablet by mouth at bedtime if needed for sleep 04/09/16  Yes Darlyne Russian, MD  aspirin 81 MG chewable tablet Chew 1 tablet (81 mg total) by mouth daily. 03/05/15  Yes Thurnell Lose, MD  Cholecalciferol (VITAMIN D-3) 1000 UNITS CAPS Take 1 capsule by  mouth daily.    Yes Historical Provider, MD  LORazepam (ATIVAN) 1 MG tablet TAKE 1/2 A TABLET BY MOUTH TWICE DAILY DURING THE DAY FOR ANXIETY, AND 1 TABLET AT BEDTIME IF NEEDED 04/09/16  Yes Darlyne Russian, MD  Multiple Vitamin (MULTIVITAMIN) tablet Take 1 tablet by mouth daily.   Yes Historical Provider, MD   ROS: The patient denies fevers, chills, night sweats, unintentional weight loss, chest pain, palpitations, wheezing, dyspnea on exertion, nausea, vomiting, abdominal pain, dysuria, hematuria, melena, weakness, or tingling. +numbness, +gait issue  All other systems have been reviewed and were otherwise negative with the exception of those mentioned in the HPI and as above.    PHYSICAL EXAM: Vitals:   06/27/16 0916  BP: 122/72  Pulse: 72  Resp:  17  Temp: 98.2 F (36.8 C)   Body mass index is 23.85 kg/m.  General: Alert, no acute distress HEENT:  Normocephalic, atraumatic, oropharynx patent. Eye: [name] Cardiovascular: Regular rate and rhythm, no rubs murmurs or gallops. No Carotid bruits, radial pulse intact. No pedal edema.  Respiratory: Clear to auscultation bilaterally. No wheezes, rales, or rhonchi. No cyanosis, no use of accessory musculature Abdominal: No organomegaly, abdomen is soft and non-tender, positive bowel sounds. No masses. Musculoskeletal: No edema, tenderness  Skin: No rashes. Neurologic: Masklike facies with mild R facial paralysis. Cogwheeling of the UE. Walks with a shuffling gait. Decreased sensation from the midfoot distally with absent ankle reflexes.  Psychiatric: Patient acts appropriately throughout our interaction. Lymphatic: No cervical or submandibular lymphadenopathy  LABS:  EKG/XRAY:   Primary read interpreted by Dr. Everlene Farrier at [facility].  ASSESSMENT/PLAN: Pt presents with Parkinsonian features which involves termor, rigidity, gait disorder  and masklike facial appearance. 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 I do not feel that  these symptoms are psychogenic. He needs  neuro workup. Pt is hesitant due to financial reasons. Will discuss with his nurse coordinator prior to making a referral to his neurologist.I personally performed the services described in this documentation, which was scribed in my presence. The recorded information has been reviewed and is accurate.     Gross sideeffects, risk and benefits, and alternatives of medications d/w patient. Patient is aware that all medications have potential sideeffects and we are unable to predict every sideeffect or drug-drug interaction that may occur.  Arlyss Queen MD 06/27/2016 9:39 AM

## 2016-06-29 ENCOUNTER — Telehealth: Payer: Self-pay | Admitting: *Deleted

## 2016-07-09 ENCOUNTER — Other Ambulatory Visit: Payer: Self-pay | Admitting: Nurse Practitioner

## 2016-07-09 DIAGNOSIS — M792 Neuralgia and neuritis, unspecified: Secondary | ICD-10-CM

## 2016-07-09 MED ORDER — LIDOCAINE 5 % EX PTCH: 1.0000 | MEDICATED_PATCH | CUTANEOUS | 3 refills | Status: DC

## 2016-07-11 ENCOUNTER — Other Ambulatory Visit: Payer: Self-pay | Admitting: Nurse Practitioner

## 2016-07-11 ENCOUNTER — Telehealth: Payer: Self-pay | Admitting: *Deleted

## 2016-07-11 MED ORDER — GABAPENTIN 300 MG PO CAPS: 300.0000 mg | ORAL_CAPSULE | Freq: Two times a day (BID) | ORAL | 2 refills | Status: DC

## 2016-07-11 NOTE — Telephone Encounter (Signed)
Patietn called complaining of neurogenic pain. Dr. Marin Olp called in Neurontin to pharmacy,

## 2016-07-19 ENCOUNTER — Other Ambulatory Visit: Payer: Self-pay | Admitting: Hematology & Oncology

## 2016-07-19 ENCOUNTER — Ambulatory Visit (HOSPITAL_BASED_OUTPATIENT_CLINIC_OR_DEPARTMENT_OTHER): Payer: Commercial Managed Care - PPO

## 2016-07-19 ENCOUNTER — Ambulatory Visit (HOSPITAL_BASED_OUTPATIENT_CLINIC_OR_DEPARTMENT_OTHER): Payer: Commercial Managed Care - PPO | Admitting: Hematology & Oncology

## 2016-07-19 ENCOUNTER — Encounter: Payer: Self-pay | Admitting: Hematology & Oncology

## 2016-07-19 VITALS — BP 122/71 | HR 83 | Temp 98.2°F | Resp 16 | Ht 71.0 in | Wt 169.0 lb

## 2016-07-19 DIAGNOSIS — E349 Endocrine disorder, unspecified: Secondary | ICD-10-CM

## 2016-07-19 DIAGNOSIS — C8190 Hodgkin lymphoma, unspecified, unspecified site: Secondary | ICD-10-CM | POA: Diagnosis not present

## 2016-07-19 DIAGNOSIS — R1013 Epigastric pain: Secondary | ICD-10-CM

## 2016-07-19 DIAGNOSIS — C8195 Hodgkin lymphoma, unspecified, lymph nodes of inguinal region and lower limb: Secondary | ICD-10-CM

## 2016-07-19 LAB — COMPREHENSIVE METABOLIC PANEL
ALT: 14 U/L (ref 0–55)
AST: 18 U/L (ref 5–34)
Albumin: 4.2 g/dL (ref 3.5–5.0)
Alkaline Phosphatase: 76 U/L (ref 40–150)
Anion Gap: 9 mEq/L (ref 3–11)
BUN: 22.8 mg/dL (ref 7.0–26.0)
CHLORIDE: 106 meq/L (ref 98–109)
CO2: 26 meq/L (ref 22–29)
Calcium: 9.8 mg/dL (ref 8.4–10.4)
Creatinine: 1.1 mg/dL (ref 0.7–1.3)
EGFR: 76 mL/min/{1.73_m2} — ABNORMAL LOW (ref >90)
GLUCOSE: 99 mg/dL (ref 70–140)
POTASSIUM: 4.4 meq/L (ref 3.5–5.1)
SODIUM: 140 meq/L (ref 136–145)
Total Bilirubin: 0.7 mg/dL (ref 0.20–1.20)
Total Protein: 7.3 g/dL (ref 6.4–8.3)

## 2016-07-19 LAB — CBC WITH DIFFERENTIAL (CANCER CENTER ONLY)
BASO#: 0 10*3/uL (ref 0.0–0.2)
BASO%: 0.8 % (ref 0.0–2.0)
EOS%: 2.3 % (ref 0.0–7.0)
Eosinophils Absolute: 0.1 10*3/uL (ref 0.0–0.5)
HEMATOCRIT: 41.7 % (ref 38.7–49.9)
HGB: 14.3 g/dL (ref 13.0–17.1)
LYMPH#: 0.5 10*3/uL — AB (ref 0.9–3.3)
LYMPH%: 11 % — AB (ref 14.0–48.0)
MCH: 32.3 pg (ref 28.0–33.4)
MCHC: 34.3 g/dL (ref 32.0–35.9)
MCV: 94 fL (ref 82–98)
MONO#: 0.6 10*3/uL (ref 0.1–0.9)
MONO%: 11.6 % (ref 0.0–13.0)
NEUT#: 3.5 10*3/uL (ref 1.5–6.5)
NEUT%: 74.3 % (ref 40.0–80.0)
PLATELETS: 206 10*3/uL (ref 145–400)
RBC: 4.43 10*6/uL (ref 4.20–5.70)
RDW: 12.6 % (ref 11.1–15.7)
WBC: 4.7 10*3/uL (ref 4.0–10.0)

## 2016-07-19 LAB — LACTATE DEHYDROGENASE: LDH: 191 U/L (ref 125–245)

## 2016-07-19 NOTE — Progress Notes (Signed)
Hematology and Oncology Follow Up Visit  Brian [name] [id number] 03/31/1961 55 y.o. 07/19/2016   Principle Diagnosis:  Recurrent Hodgkin's Disease -  S/p autologous transplant  TIA-resolved  Current Therapy:   S/p CAR-T cell therapy - [facility] on 03/06/2016     Interim History:  Brian [name] is in for an unscheduled visit. I really feel bad for him. A lot has happened to him since we last saw him. The problem now is that he is some kind of neurological issue. It almost looks like he has Parkinson's. 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 He did have a TIA. He has the appearance of somebody with early Parkinson's. He walks like he has Parkinson's. He had cc a neurologist tomorrow.   He comes in today because he's been having some left upper quadrant abdominal pain. He's had this for a couple weeks. He is or his had some discomfort because of past surgery and neurologic compromise.  He is a low bit constipated.   He's had no fever. He's had no nausea or vomiting.   He is still working. Work over is becoming more difficult. He is having more difficulty writing.   He is trying exercise. Again, it is hard for him to do a lot of exercising because of his neurologic problems.   Of note, his last PET scan was done a couple months ago. Everything looked fine no evidence of recurrent/residual Hodgkin's disease.   Overall, his performance status is ECOG 1.   Medications:  Current Outpatient Prescriptions:  .  acetaminophen (TYLENOL) 325 MG tablet, Take 325 mg by mouth every 6 (six) hours as needed for mild pain., Disp: , Rfl:  .  ALPRAZolam (XANAX) 0.5 MG tablet, take 1 tablet by mouth at bedtime if needed for sleep, Disp: 30 tablet, Rfl: 5 .  aspirin 81 MG chewable tablet, Chew 1 tablet (81 mg total) by mouth daily., Disp: 30 tablet, Rfl: 0 .  Cholecalciferol (VITAMIN D-3) 1000 UNITS CAPS, Take 1 capsule by mouth daily. , Disp: , Rfl:  .  gabapentin (NEURONTIN) 300 MG capsule, Take 1  capsule (300 mg total) by mouth 2 (two) times daily., Disp: 60 capsule, Rfl: 2 .  lidocaine (LIDODERM) 5 %, Place 1 patch onto the skin daily. Remove & Discard patch within 12 hours or as directed by MD, Disp: 30 patch, Rfl: 3 .  LORazepam (ATIVAN) 1 MG tablet, TAKE 1/2 A TABLET BY MOUTH TWICE DAILY DURING THE DAY FOR ANXIETY, AND 1 TABLET AT BEDTIME IF NEEDED, Disp: 20 tablet, Rfl: 5 .  Multiple Vitamin (MULTIVITAMIN) tablet, Take 1 tablet by mouth daily., Disp: , Rfl:   Allergies: No Known Allergies  Past Medical History, Surgical history, Social history, and Family History were reviewed and updated.  Review of Systems: As above  Physical Exam:  height is 5\' 11"  (1.803 m) and weight is 169 lb (76.7 kg). His oral temperature is 98.2 F (36.8 C). His blood pressure is 122/71 and his pulse is 83. His respiration is 16.   Well-developed and well-nourished white gentleman. Head and neck exam shows no ocular or oral lesions. There are no palpable cervical or supraclavicular lymph nodes. Lungs are clear. Cardiac exam regular rate and rhythm with no murmurs, rubs or bruits.. Abdomen is soft. He has good bowel sounds. There is no fluid wave. There is no palpable liver or spleen tip. Back exam shows a thoracoscopy site in the left lateral chest wall. This is  healed. There is no swelling or erythema. Extremities shows no clubbing, cyanosis or edema. Skin exam shows no rashes, ecchymoses or petechia . Neurological exam shows a little bit of a flat affect. He has a shuffling gait. I don't see any obvious tremor. Cranial nerves seem to be intact.  Lab Results  Component Value Date   WBC 4.7 07/19/2016   HGB 14.3 07/19/2016   HCT 41.7 07/19/2016   MCV 94 07/19/2016   PLT 206 07/19/2016     Chemistry      Component Value Date/Time   NA 140 07/19/2016 1305   K 4.4 07/19/2016 1305   CL 108 05/18/2016 1335   CL 105 02/08/2016 1117   CL 107 03/25/2013 0828   CO2 26 07/19/2016 1305   BUN 22.8  07/19/2016 1305   CREATININE 1.1 07/19/2016 1305      Component Value Date/Time   CALCIUM 9.8 07/19/2016 1305   ALKPHOS 76 07/19/2016 1305   AST 18 07/19/2016 1305   ALT 14 07/19/2016 1305   BILITOT 0.70 07/19/2016 1305         Impression and Plan: Brian [name] is a 55 year old gentleman.  He has history of recurrent Hodgkin's disease. 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 He then had a recurrence after this.  It is not clear at all last was going on with him. I think that we will have to get an abdominal ultrasound. I did this would be a good start to see if there is any issues. If we do see something that is unusual, that we might want to get a CT scan or possibly a PET scan.   I'm not sure why he would have this Parkinson's type illness. I suppose that the treatment studies had in the past might be a factor. However, with Adcetris, neuropathy has been described. I'm not seeing any reports of parkinsonian-type issues.  It will be interesting to see what the neurologist has to say. I'm sure he will have an MRI of the brain and probably some nerve conduction studies.  I spent about 35 minutes with him. I've not seen him for a while. I'm glad that I was able to see him and try to help out.  There is still a ton of stress in his life.  Cassell Smiles, MD 8/24/20175:10 PM

## 2016-07-19 NOTE — Progress Notes (Signed)
Brian [name] was seen today in the movement disorders clinic for neurologic consultation at the request of DAUB, STEVE A, MD.  The consultation is for the evaluation of parkinsonism.  I have reviewed numerous records made available to me.  The patient previously saw Dr. [name] and subsequently Dr. [name].  He saw Dr. [name] when he was in the hospital in April, 2016.  That was for an event in which he experienced 1-2 minutes of speech and word finding trouble.  Dr. [name] doubted that was a TIA.  He had an EEG that was negative.  He had an MRI of the brain on 03/04/2015 without gadolinium that I had the opportunity to review.  There were a very few scattered T2 hyperintensities, but diffusion-weighted imaging was negative.  MRSA was negative.  He followed up with Dr. [name] one time on an outpatient basis, but nothing further was revealed.  Today, the patient presents because of shuffling of the feet for about 8 months.  Pt states that it started dragging of the L foot but now seems to be dragging both.  He has also noted handwriting and voice changes and has become concerned about the possibility of Parkinson's disease.  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  He also has a history of chemotherapy-induced peripheral neuropathy (Adcetris).  He asks about "psychogenic stuff."  States that he has been through divorce along with his cancer.  Asks if his sx's could be from stress as he has been reading about psychogenic manifestations of sx's.  Specific Symptoms:  Tremor: No. Family hx of similar:  No., father who had NPH Voice: intermittent raspy Sleep: sleeps well  Vivid Dreams:  No.  Acting out dreams:  Yes.  , knocked lamp over Wet Pillows: Yes.   Postural symptoms:  Yes.  , seems okay unless trying to walk on toes and  seems to be related to neuropathy  Falls?  No. Bradykinesia symptoms: may hesitate to "get going." Loss of smell:  No. Loss of taste:  No. Urinary Incontinence:  No. Difficulty Swallowing:  No. Handwriting, micrographia: Yes.  , R hand dominant Trouble with ADL's:  No.  Trouble buttoning clothing: No. Depression:  Yes.   Memory changes:  No. Hallucinations:  No.  visual distortions: No. N/V:  No. Lightheaded:  No.  Syncope: No. Diplopia:  No. Dyskinesia:  No.    ALLERGIES:  No Known Allergies  CURRENT MEDICATIONS:  Outpatient Encounter Prescriptions as of 07/20/2016  Medication Sig  . acetaminophen (TYLENOL) 325 MG tablet Take 325 mg by mouth every 6 (six) hours as needed for mild pain.  Marland Kitchen ALPRAZolam (XANAX) 0.5 MG tablet take 1 tablet by mouth at bedtime if needed for sleep  . aspirin 81 MG chewable tablet Chew 1 tablet (81 mg total) by mouth daily.  . Cholecalciferol (VITAMIN D-3) 1000 UNITS CAPS Take 1 capsule by mouth daily.   Marland Kitchen LORazepam (ATIVAN) 1 MG tablet TAKE 1/2 A TABLET BY MOUTH TWICE DAILY DURING THE DAY FOR ANXIETY, AND 1 TABLET AT BEDTIME IF NEEDED  . Multiple Vitamin (MULTIVITAMIN) tablet Take 1 tablet by mouth daily.  . [DISCONTINUED] gabapentin (NEURONTIN) 300 MG capsule Take 1 capsule (300 mg total) by mouth 2 (two) times daily. (Patient not taking: Reported on 07/20/2016)  . [DISCONTINUED] lidocaine (  LIDODERM) 5 % Place 1 patch onto the skin daily. Remove & Discard patch within 12 hours or as directed by MD   No facility-administered encounter medications on file as of 07/20/2016.     PAST MEDICAL HISTORY:   Past Medical History:  Diagnosis Date  . Anxiety   . Dysrhythmia    past hx pvc  . H/O autologous stem cell transplant ([place])    may 2014  [facility]  . History of Bell's palsy    2009  RIGHT SIDE--  HAS 80% FUNCTION / PT STATES A LITTLE ASYMETRICAL AND EFFECTS MOUTH  . Hodgkin's disease, nodular sclerosis, of inguinal region/lower limb ([place])  ONOLOGIST--  DR [facility]NE[facility] AND A [facility]     [facility] CHEMO 2013/  AUTOLOGUS STEM CELL TRANSPLANT MAY 2014 AT [facility]  . Mass of right inguinal region   . PVC (premature ventricular contraction)    "benign"  . TIA (transient ischemic attack) 02/2015   "probable TIA"  . Wears contact lenses     PAST SURGICAL HISTORY:   Past Surgical History:  Procedure Laterality Date  . AXILLARY LYMPH NODE BIOPSY Left 02/02/2013   Procedure: NEEDLE LOCALIZED AXILLARY LYMPH NODE BIOPSY;  Surgeon: Haywood Lasso, MD;  Location: [place];  Service: General;  Laterality: Left;  . BONE MARROW BIOPSY  08/2012  . CYST REMOVAL NECK Left 1980  . LEFT INGUINAL LYMPH NODE BX  09-09-2012  . LYMPH NODE BIOPSY N/A 07/28/2015   Procedure: LYMPH NODE BIOPSY;  Surgeon: Melrose Nakayama, MD;  Location: [place];  Service: Thoracic;  Laterality: N/A;  . NODE DISSECTION Right 07/28/2015   Procedure: NODE DISSECTION;  Surgeon: Melrose Nakayama, MD;  Location: [place];  Service: Thoracic;  Laterality: Right;  . PLEURA BIOPSY Left 05/31/2014  . REMOVAL RIGHT INGUINAL LYMPH NODES  08-16-2011  . SCROTAL EXPLORATION Right 01/04/2014   Procedure: SCROTUM EXPLORATION   INGUINAL , EXCISION OF CYSTIC MASS OF RIGHT SPERMATIC CORD, WITH FROZEN SECTION;  Surgeon: Franchot Gallo, MD;  Location: [facility];  Service: Urology;  Laterality: Right;  . TRANSTHORACIC ECHOCARDIOGRAM  03-18-2013   MILD LVH/  EF 55-60%  . VIDEO ASSISTED THORACOSCOPY Left 05/31/2014   Procedure: LEFT VIDEO ASSISTED THORACOSCOPY, PLEURAL BIOPSY;  Surgeon: Melrose Nakayama, MD;  Location: [place];  Service: Thoracic;  Laterality: Left;  Marland Kitchen VIDEO ASSISTED THORACOSCOPY Right 07/28/2015   Procedure: RIGHT VIDEO ASSISTED THORACOSCOPY;  Surgeon: Melrose Nakayama, MD;  Location: [place];  Service: Thoracic;  Laterality: Right;  Marland Kitchen VIDEO ASSISTED THORACOSCOPY (VATS)/ LYMPH NODE SAMPLING Right 07/28/2015  . VIDEO ASSISTED THORACOSCOPY (VATS)/WEDGE  RESECTION  05/31/2014  . VIDEO BRONCHOSCOPY WITH ENDOBRONCHIAL ULTRASOUND  07/28/2015  . VIDEO BRONCHOSCOPY WITH ENDOBRONCHIAL ULTRASOUND N/A 07/28/2015   Procedure: VIDEO BRONCHOSCOPY WITH ENDOBRONCHIAL ULTRASOUND;  Surgeon: Melrose Nakayama, MD;  Location: [place];  Service: Thoracic;  Laterality: N/A;    SOCIAL HISTORY:   Social History   Social History  . Marital status: Legally Separated    Spouse name: N/A  . Number of children: N/A  . Years of education: N/A   Occupational History  . Arboriculturist Communication   Social History Main Topics  . Smoking status: Never Smoker  . Smokeless tobacco: Never Used  . Alcohol use 8.4 oz/week    7 Cans of beer, 7 Glasses of wine per week  . Drug use: No  . Sexual activity: Not Currently   Other Topics Concern  . Not  on file   Social History Narrative   Married. Education: [employer]. Exercise: jog/walk/ lift weights 7 days a week, 1-2 miles.    FAMILY HISTORY:   Family Status  Relation Status  . Mother Deceased  . Father Deceased  . Sister Alive  . Brother Alive  . Son Alive    ROS:  Feet and toe paresthesias with neuropathy.  A complete 10 system review of systems was obtained and was unremarkable apart from what is mentioned above.  PHYSICAL EXAMINATION:    VITALS:   Vitals:   07/20/16 0936  BP: 120/62  Pulse: 82  Weight: 167 lb (75.8 kg)  Height: 5' 10"  (1.778 m)    GEN:  The patient appears stated age and is in NAD. HEENT:  Normocephalic, atraumatic.  The mucous membranes are moist. The superficial temporal arteries are without ropiness or tenderness. CV:  RRR Lungs:  CTAB Neck/HEME:  There are no carotid bruits bilaterally.  Neurological examination:  Orientation: The patient is alert and oriented x3. Fund of knowledge is appropriate.  Recent and remote memory are intact.  Attention and concentration are normal.    Able to name objects and repeat phrases. Cranial nerves: There is slight right facial droop, on a  noticeable because of decreased nasolabial fold on the right.  He is able to symmetrically activate the muscles of facial expression.  He does have abnormal synkinetic reinnervation of the muscles of facial expression on the right.  He does not have a Bell's phenomenon.  There is facial hypomimia.  Pupils are equal round and reactive to light bilaterally. Fundoscopic exam reveals clear margins bilaterally. Extraocular muscles are intact. The visual fields are full to confrontational testing. The speech is fluent and clear.  He is hypophonic.  Soft palate rises symmetrically and there is no tongue deviation. Hearing is intact to conversational tone. Sensation: Sensation is intact to light and pinprick throughout (facial, trunk, extremities). Vibration is intact at the bilateral big toe in just slightly decreased. There is no extinction with double simultaneous stimulation. There is no sensory dermatomal level identified. Motor: Strength is 5/5 in the bilateral upper and lower extremities.   Shoulder shrug is equal and symmetric.  There is no pronator drift. Deep tendon reflexes: Deep tendon reflexes are 3/4 at the bilateral biceps, triceps, brachioradialis, patella and achilles. There are bilateral pectoralis reflexes.  Plantar responses are downgoing bilaterally.  Movement examination: Tone: He has significant difficulty relaxing the arms for testing of rigidity, but there does appear to be increased tone in the right upper extremity.  However, when performing distraction procedures, the increased tone actually appears just a bit better, although it is still present Abnormal movements: Mild tremor can be felt at rest, but is not able to be seen.  This is in both arms, right more than left.  This is more of a tremulousness than true tremor. Coordination:  There is slowness with virtually all forms of rapid alternating movements and decremation with most forms of rapid alternating movements, but it does  improve with encouragement.  Gait and Station: The patient has no difficulty arising out of a deep-seated chair without the use of the hands. The patient's stride length is normal, but he has foot drop on the right when he walks (is not evident when the patient is just examined with manual motor testing).  He is able to run down the hall normally.  The patient has a negative pull test.    He sways in the  Romberg position with eyes closed.  ASSESSMENT/PLAN:  1.  Parkinsonism  -While this is mild on examination, it definitely needs further exploring.  In addition, he is diffusely hyperreflexic, which would be somewhat odd in a patient who has defined peripheral neuropathy.  I told him that I thought he needed an MRI of the cervical spine, given diffuse hyperreflexia and increased tone on the right.  However, he was very focused on the fact that he had no neck or back pain.  I told him that this can happen, but should still be explored.  Unfortunately, he has a high deductible plan and would have to pay the full cost of the MRI of the cervical spine and opted not to do this.  He had an MRI of the brain about a year ago that just showed a few scattered T2 hyperintensities and was otherwise unremarkable.  -He does have difficulty relaxing his arms to get an adequate examination, and I think it would be reasonable to proceed with a DaT scan.  Will call clinical trial coordinator at [facility] to make sure okay.  -He asked me multiple times if this could be psychogenic.  I told him that this really needed to be a diagnosis of exclusion and I thought we needed to do the above testing first.  I understand he is under an extreme amount of stress with his medical illness as well as his divorce, but psychogenic illness is a diagnosis of exclusion once other illness is ruled out.  2.  Chemo (adcetris) induced peripheral neuropathy  -The patient has only mild clinical examination evidence of a diffuse peripheral neuropathy,  and I do not think this can explain all of his symptoms.  I think it does explain his paresthesias that he has had since chemotherapy, but I am not sure it can explain his gait changes, and definitely cannot explain his hyperreflexia, which would be unusual in a neuropathy patient.  He may have always been hyperreflexic, which I explained to him, but since this is my first time seeing him, I do not note this.   3.  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  4.  Hodgkins lymphoma, s/p autologous transplant with recurrence post transplant  5.  2016 transient (1-2 min) of word/speech trouble  -reviewed hospital records and agree that this likely did not represent TIA and if anything, more consistent with seizure.  In any case, the event has not recurred.  6.  Follow-up with me after the above testing has been completed.  Pt education provided.  Pt with multiple questions and answered to best of my ability.  Much greater than 50% of this visit was spent in counseling and coordinating care.  Total face to face time:  60 min.  Did not include the 35 min in record review that was non face to face time prior to the visit.

## 2016-07-20 ENCOUNTER — Ambulatory Visit (HOSPITAL_BASED_OUTPATIENT_CLINIC_OR_DEPARTMENT_OTHER)
Admission: RE | Admit: 2016-07-20 | Discharge: 2016-07-20 | Disposition: A | Discharge status: Home / Self Care | Payer: Commercial Managed Care - PPO | Source: Ambulatory Visit | Attending: Hematology & Oncology | Admitting: Hematology & Oncology

## 2016-07-20 ENCOUNTER — Encounter: Payer: Self-pay | Admitting: Neurology

## 2016-07-20 ENCOUNTER — Ambulatory Visit (INDEPENDENT_AMBULATORY_CARE_PROVIDER_SITE_OTHER): Payer: Commercial Managed Care - PPO | Admitting: Neurology

## 2016-07-20 ENCOUNTER — Telehealth: Payer: Self-pay | Admitting: Neurology

## 2016-07-20 VITALS — BP 120/62 | HR 82 | Ht 70.0 in | Wt 167.0 lb

## 2016-07-20 DIAGNOSIS — G620 Drug-induced polyneuropathy: Secondary | ICD-10-CM | POA: Diagnosis not present

## 2016-07-20 DIAGNOSIS — G20 Parkinson's disease: Secondary | ICD-10-CM | POA: Diagnosis not present

## 2016-07-20 DIAGNOSIS — C8190 Hodgkin lymphoma, unspecified, unspecified site: Secondary | ICD-10-CM | POA: Diagnosis not present

## 2016-07-20 DIAGNOSIS — C8195 Hodgkin lymphoma, unspecified, lymph nodes of inguinal region and lower limb: Secondary | ICD-10-CM | POA: Diagnosis not present

## 2016-07-20 DIAGNOSIS — T451X5A Adverse effect of antineoplastic and immunosuppressive drugs, initial encounter: Secondary | ICD-10-CM

## 2016-07-20 DIAGNOSIS — N281 Cyst of kidney, acquired: Secondary | ICD-10-CM | POA: Diagnosis not present

## 2016-07-20 DIAGNOSIS — Z8669 Personal history of other diseases of the nervous system and sense organs: Secondary | ICD-10-CM | POA: Diagnosis not present

## 2016-07-20 DIAGNOSIS — R1013 Epigastric pain: Secondary | ICD-10-CM | POA: Insufficient documentation

## 2016-07-20 LAB — TESTOSTERONE: TESTOSTERONE: 243 ng/dL — AB (ref 264–916)

## 2016-07-20 NOTE — Telephone Encounter (Signed)
LMOM for Irine Seal, clinical coordinator at [facility], to call back to instruct me if patient could have a DAT scan. Lennice Sites at [phone] which is her office number. Her pager is [phone].

## 2016-07-20 NOTE — Patient Instructions (Signed)
1. Call me with the name of your clinical coordinator at [facility] to make sure you can have a DAT scan performed.

## 2016-07-23 ENCOUNTER — Telehealth: Payer: Self-pay | Admitting: Neurology

## 2016-07-23 DIAGNOSIS — R251 Tremor, unspecified: Secondary | ICD-10-CM

## 2016-07-23 NOTE — Telephone Encounter (Signed)
Received call back from [facility] that patient could proceed with DAT scan since he is not undergoing treatment at this time.   Referral faxed to [facility] for Dat scan and they will call patient to schedule.

## 2016-07-24 ENCOUNTER — Telehealth: Payer: Self-pay | Admitting: Neurology

## 2016-07-24 NOTE — Telephone Encounter (Signed)
Brain Zollner Mar 28, 2061. Dianna from [facility] called in needing to get authorization for a DAT Scan he is having on 08/16/16 at 10:30. Her fax # is [phone]. Thank you

## 2016-07-24 NOTE — Telephone Encounter (Signed)
Have submitted paperwork to insurance and authorization is pending.

## 2016-07-24 NOTE — Telephone Encounter (Signed)
Brian [name] 03/06/2061. He was calling back to follow up on the status of everything regarding his DAT scan. His # is [id number]. He said he is involved in a clinical trial in [facility] and wanted to make sure the nuclear medication would not affect what was being done. Thank you

## 2016-07-24 NOTE — Telephone Encounter (Signed)
Patient made aware everything is fine.

## 2016-08-07 NOTE — Telephone Encounter (Signed)
Authorization [id number]  Faxed.

## 2016-08-17 ENCOUNTER — Telehealth: Payer: Self-pay | Admitting: Neurology

## 2016-08-17 NOTE — Telephone Encounter (Signed)
Patient called back and I spoke with him.  He was wanting results of a test over the phone and I told him our policy was to come in for a face to face visit for results.  He doesn't want to wait 10 days and wants a call with a yes or no and he will come in for the appt. To discuss.  I told him I would pass on to the physician.

## 2016-08-17 NOTE — Telephone Encounter (Signed)
Message relayed to patient. Pt was very worried about results. Pt repeatedly asked for results to be release via phone and/or mychart. Pt requesting call from provider for results. Pt stated he could not wait without knowing until the third of October.

## 2016-08-17 NOTE — Telephone Encounter (Signed)
DaT scan done on 08/16/16 and showed abnormal grade 3 uptake, virtually absent bilaterally in both putamen and caudate nuclei.  Please schedule f/u for pt to discuss if not already done so (I will give him results at f/u)

## 2016-08-17 NOTE — Telephone Encounter (Signed)
PT wants to get a call today about the results of his test [facility] CB# [phone]

## 2016-08-17 NOTE — Telephone Encounter (Signed)
Spoke with patient. He was made aware unless it is life threatening Dr. Carles Collet discusses results with patients in the office so that she can answer all of their questions and examine them and have a discussion.  He expressed multiple times his complete dissatisfaction with the process and cited multiple times he was given results from other doctors. He tried to get me to give me results. Which I told me were not in our system- as they came from [facility] and needed to be relayed and discussed with the doctor at his appt.  I assured him I would call should any cancellations in her schedule become available.  He again asked if she would call him over the weekend and expressed his complete unhappiness over the situation.

## 2016-08-17 NOTE — Telephone Encounter (Signed)
Can you please let patient know that we have him scheduled on 08/28/2016 at 2:00 pm to discuss DAT scan results with Dr. Carles Collet? Thank you.

## 2016-08-17 NOTE — Telephone Encounter (Signed)
This is not an appropriate phone discussion and has ramifications for his health and further treatment.  I need to examine this patient in person (cannot do that on the phone) and correlate with testing.  I also don't have the actual scan from [facility], just a report.

## 2016-08-20 ENCOUNTER — Telehealth: Payer: Self-pay | Admitting: Neurology

## 2016-08-20 NOTE — Telephone Encounter (Signed)
I have most certainly not had those results for 11 days.  I need an appt with him, as we told him last week.

## 2016-08-20 NOTE — Telephone Encounter (Signed)
Brian [name] will be here 9/28 at 11:30 to see you.  He is still not happy that you will not talk with him and I told him you needed to have a conversation with him face to face.

## 2016-08-20 NOTE — Telephone Encounter (Signed)
PT called and wants the results, he said he has waited 11 days for the results/Da[facility] CB# [phone]

## 2016-08-20 NOTE — Telephone Encounter (Signed)
I understand his frustration and am doing my best to see him (taking lunch hr to do so).

## 2016-08-20 NOTE — Telephone Encounter (Signed)
I don't know what else to tell him.

## 2016-08-21 ENCOUNTER — Telehealth: Payer: Self-pay | Admitting: Neurology

## 2016-08-21 NOTE — Telephone Encounter (Signed)
Looks like my 10am cancelled for tomorrow if patient wants to come there instead of the following day during my lunch.  I have been trying to work him into clinic schedule.  Please call patient and ask him

## 2016-08-21 NOTE — Progress Notes (Signed)
Brian [name] was seen today in the movement disorders clinic for neurologic consultation at the request of DAUB, STEVE A, MD.  The consultation is for the evaluation of parkinsonism.  I have reviewed numerous records made available to me.  The patient previously saw Dr. [name] and subsequently Dr. [name].  He saw Dr. [name] when he was in the hospital in April, 2016.  That was for an event in which he experienced 1-2 minutes of speech and word finding trouble.  Dr. [name] doubted that was a TIA.  He had an EEG that was negative.  He had an MRI of the brain on 03/04/2015 without gadolinium that I had the opportunity to review.  There were a very few scattered T2 hyperintensities, but diffusion-weighted imaging was negative.  MRSA was negative.  He followed up with Dr. [name] one time on an outpatient basis, but nothing further was revealed.  Today, the patient presents because of shuffling of the feet for about 8 months.  Pt states that it started dragging of the L foot but now seems to be dragging both.  He has also noted handwriting and voice changes and has become concerned about the possibility of Parkinson's disease.  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  He also has a history of chemotherapy-induced peripheral neuropathy (Adcetris).  He asks about "psychogenic stuff."  States that he has been through divorce along with his cancer.  Asks if his sx's could be from stress as he has been reading about psychogenic manifestations of sx's.  08/22/16 update: The patient follows up today for testing results.  This patient is accompanied in the office by his ex wife who supplements the history.  He had a DaT scan done on 08/16/16 and showed abnormal grade 3 uptake, virtually absent bilaterally in both putamen and caudate nuclei.   He  has not had any falls since last visit.  Continues to have difficulty with picking up the feet.  He denies lightheadedness or near syncope.  He denies hallucinations.  He continues to have issues with depression.  No SI/HI.  Ex wife concerned about mood.  Also noting stiffness, some drooling, constipation.     ALLERGIES:  No Known Allergies  CURRENT MEDICATIONS:  Outpatient Encounter Prescriptions as of 08/22/2016  Medication Sig  . acetaminophen (TYLENOL) 325 MG tablet Take 325 mg by mouth every 6 (six) hours as needed for mild pain.  Marland Kitchen ALPRAZolam (XANAX) 0.5 MG tablet take 1 tablet by mouth at bedtime if needed for sleep  . aspirin 81 MG chewable tablet Chew 1 tablet (81 mg total) by mouth daily.  . Cholecalciferol (VITAMIN D-3) 1000 UNITS CAPS Take 1 capsule by mouth daily.   Marland Kitchen LORazepam (ATIVAN) 1 MG tablet TAKE 1/2 A TABLET BY MOUTH TWICE DAILY DURING THE DAY FOR ANXIETY, AND 1 TABLET AT BEDTIME IF NEEDED  . Multiple Vitamin (MULTIVITAMIN) tablet Take 1 tablet by mouth daily.  . pramipexole (MIRAPEX) 0.125 MG tablet 1 tablet TID for a week, then 2 tablets TID for a week (then switch to 0.5 mg)  . pramipexole (MIRAPEX) 0.5 MG tablet Take 1 tablet (0.5 mg total) by mouth 3 (three) times daily.   No facility-administered encounter medications on file as of 08/22/2016.     PAST MEDICAL HISTORY:  Past Medical History:  Diagnosis Date  . Anxiety   . Dysrhythmia    past hx pvc  . H/O autologous stem cell transplant ([place])    may 2014  at [facility]  . History of Bell's palsy    2009  RIGHT SIDE--  HAS 80% FUNCTION / PT STATES A LITTLE ASYMETRICAL AND EFFECTS MOUTH  . Hodgkin's disease, nodular sclerosis, of inguinal region/lower limb ([place]) ONOLOGIST--  DR [facility]NEVER AND A [facility]     [facility] CHEMO 2013/  AUTOLOGUS STEM CELL TRANSPLANT MAY 2014 AT [facility]  . Mass of right inguinal region   . PVC (premature ventricular contraction)    "benign"  . TIA (transient ischemic attack) 02/2015   "probable TIA"   . Wears contact lenses     PAST SURGICAL HISTORY:   Past Surgical History:  Procedure Laterality Date  . AXILLARY LYMPH NODE BIOPSY Left 02/02/2013   Procedure: NEEDLE LOCALIZED AXILLARY LYMPH NODE BIOPSY;  Surgeon: Haywood Lasso, MD;  Location: [place];  Service: General;  Laterality: Left;  . BONE MARROW BIOPSY  08/2012  . CYST REMOVAL NECK Left 1980  . LEFT INGUINAL LYMPH NODE BX  09-09-2012  . LYMPH NODE BIOPSY N/A 07/28/2015   Procedure: LYMPH NODE BIOPSY;  Surgeon: Melrose Nakayama, MD;  Location: [place];  Service: Thoracic;  Laterality: N/A;  . NODE DISSECTION Right 07/28/2015   Procedure: NODE DISSECTION;  Surgeon: Melrose Nakayama, MD;  Location: [place];  Service: Thoracic;  Laterality: Right;  . PLEURA BIOPSY Left 05/31/2014  . REMOVAL RIGHT INGUINAL LYMPH NODES  08-16-2011  . SCROTAL EXPLORATION Right 01/04/2014   Procedure: SCROTUM EXPLORATION   INGUINAL , EXCISION OF CYSTIC MASS OF RIGHT SPERMATIC CORD, WITH FROZEN SECTION;  Surgeon: Franchot Gallo, MD;  Location[facility];  Service: Urology;  Laterality: Right;  . TRANSTHORACIC ECHOCARDIOGRAM  03-18-2013   MILD LVH/  EF 55-60%  . VIDEO ASSISTED THORACOSCOPY Left 05/31/2014   Procedure: LEFT VIDEO ASSISTED THORACOSCOPY, PLEURAL BIOPSY;  Surgeon: Melrose Nakayama, MD;  Location: [place];  Service: Thoracic;  Laterality: Left;  Marland Kitchen VIDEO ASSISTED THORACOSCOPY Right 07/28/2015   Procedure: RIGHT VIDEO ASSISTED THORACOSCOPY;  Surgeon: Melrose Nakayama, MD;  Location: [place];  Service: Thoracic;  Laterality: Right;  Marland Kitchen VIDEO ASSISTED THORACOSCOPY (VATS)/ LYMPH NODE SAMPLING Right 07/28/2015  . VIDEO ASSISTED THORACOSCOPY (VATS)/WEDGE RESECTION  05/31/2014  . VIDEO BRONCHOSCOPY WITH ENDOBRONCHIAL ULTRASOUND  07/28/2015  . VIDEO BRONCHOSCOPY WITH ENDOBRONCHIAL ULTRASOUND N/A 07/28/2015   Procedure: VIDEO BRONCHOSCOPY WITH ENDOBRONCHIAL ULTRASOUND;  Surgeon: Melrose Nakayama, MD;  Location: [place];  Service: Thoracic;  Laterality: N/A;    SOCIAL HISTORY:   Social History   Social History  . Marital status: Legally Separated    Spouse name: N/A  . Number of children: N/A  . Years of education: N/A   Occupational History  . Arboriculturist Communication   Social History Main Topics  . Smoking status: Never Smoker  . Smokeless tobacco: Never Used  . Alcohol use 8.4 oz/week    7 Cans of beer, 7 Glasses of wine per week  . Drug use: No  . Sexual activity: Not Currently   Other Topics Concern  . Not on file   Social History Narrative   Married. Education: [employer]. Exercise: jog/walk/ lift weights 7 days a week, 1-2 miles.    FAMILY HISTORY:   Family Status  Relation Status  . Mother Deceased  . Father Deceased  .  Sister Alive  . Brother Alive  . Son Alive    ROS:  Feet and toe paresthesias with neuropathy.  A complete 10 system review of systems was obtained and was unremarkable apart from what is mentioned above.  PHYSICAL EXAMINATION:    VITALS:   Vitals:   08/22/16 1011  BP: 140/88  Pulse: (!) 45  SpO2: 97%  Weight: 169 lb 4 oz (76.8 kg)  Height: _0  (1.778 m)     Wt Readings from Last 3 Encounters:  08/22/16 169 lb 4 oz (76.8 kg)  07/20/16 167 lb (75.8 kg)  07/19/16 169 lb (76.7 kg)   Temp Readings from Last 3 Encounters:  07/19/16 98.2 F (36.8 C) (Oral)  06/27/16 98.2 F (36.8 C) (Oral)  05/18/16 98.2 F (36.8 C) (Oral)   BP Readings from Last 3 Encounters:  08/22/16 140/88  07/20/16 120/62  07/19/16 122/71   Pulse Readings from Last 3 Encounters:  08/22/16 (!) 45  07/20/16 82  07/19/16 83     GEN:  The patient appears stated age and is in NAD. HEENT:  Normocephalic, atraumatic.  The mucous membranes are moist. The superficial temporal arteries are without ropiness or tenderness. CV:  Bradycardic.  Regular rhythm. Lungs:  CTAB Neck/HEME:  There are no carotid bruits bilaterally.  Neurological examination:  Orientation:  The patient is alert and oriented x3. Fund of knowledge is appropriate.  Cranial nerves: There is slight right facial droop, only noticeable because of decreased nasolabial fold on the right.  He is able to symmetrically activate the muscles of facial expression.  He does have abnormal synkinetic reinnervation of the muscles of facial expression on the right.  He does not have a Bell's phenomenon.  There is significant facial hypomimia.   Extraocular muscles are intact. The visual fields are full to confrontational testing. The speech is fluent and clear.  He is hypophonic.  Soft palate rises symmetrically and there is no tongue deviation. Hearing is intact to conversational tone. Sensation: Sensation is intact to light throughout. Deep tendon reflexes: Deep tendon reflexes are 3/4 at the bilateral biceps, triceps, brachioradialis, patella and achilles. There are bilateral pectoralis reflexes.  Plantar responses are downgoing bilaterally.  Movement examination: Tone: He did much better this visit relaxing the arms for testing.  He has moderate rigidity in the right upper extremity.  Left upper extremity tone was good. Abnormal movements: None Coordination:  There is slowness with virtually all forms of rapid alternating movements and decremation with most forms of rapid alternating movements, but it does improve with encouragement.  The left was worse than the right, even though rigidity was worse on the right. Gait and Station: The patient has no difficulty arising out of a deep-seated chair without the use of the hands. The patient's stride length is decreased and he is dragging the left leg today.  The patient has a negative pull test.    He sways in the Romberg position with eyes closed.  ASSESSMENT/PLAN:  1.  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  -The patient had a DaT scan on 08/16/2016 that showed abnormal grade 3 uptake, virtually absent bilaterally in both putamen and caudate  nuclei.   -We discussed the diagnosis as well as pathophysiology of the disease.  We discussed treatment options as well as prognostic indicators.  Patient education was provided.  -Greater than 50% of the 50 minute visit was spent in counseling answering questions and talking about what to expect now as well as in  the future.  We talked about medication options as well as potential future surgical options.  We talked about safety in the home.  -We decided to start pramipexole and work up to 0.5 mg 3 times per day.  We discussed risks, benefits, and side effects.  We discussed risk of compulsive behavior and sleep attacks.  Understanding was expressed.  -I will refer the patient to the Parkinson's program [facility], for PT/OT and ST.  We talked about the importance of safe, cardiovascular exercise in Parkinson's disease.  -We discussed community resources in the area including patient support groups and community exercise programs for PD and pt education was provided to the patient.  -I again reviewed his MRI of the brain done in April, 2016.  This was unremarkable with exception of a few scattered T2 hyperintensities.  He does not wish to repeat because of high deductible/out of pocket cost.  2.  Chemo (adcetris) induced peripheral neuropathy  -The patient has only mild clinical examination evidence of a diffuse peripheral neuropathy, and I do not think this can explain all of his symptoms.  I think it does explain his paresthesias that he has had since chemotherapy, but I am not sure it can explain his gait changes, and definitely cannot explain his hyperreflexia, which would be unusual in a neuropathy patient.  He may have always been hyperreflexic.  He refuses MRI cervical spine due to high deductible plan.   3.  R facial droop due to hx of cranial nerve 7 palsy  -has had negative neuroimaging (noncontrast)  -He does have abnormal synkinetic reinnervation of the right  face.  4.  Hodgkins lymphoma, s/p autologous transplant with recurrence post transplant  5.  2016 transient (1-2 min) of word/speech trouble  -reviewed hospital records and agree that this likely did not represent TIA and if anything, more consistent with seizure.  In any case, the event has not recurred.  6.  Depression, moderate, recurrent  -Talked a long time with the patient about going to see psychiatry.  Here he has a Social worker, and he should continue with that therapy.  Recommended Letta Moynahan.  Denies suicidal/homicidal ideation, but patient's ex-wife who is present today, was very worried about mood as was I.  7. Follow up is anticipated in the next few months, sooner should new neurologic issues arise.

## 2016-08-21 NOTE — Telephone Encounter (Signed)
He will be here tomorrow.

## 2016-08-21 NOTE — Progress Notes (Deleted)
Brian [name] was seen today in the movement disorders clinic for neurologic consultation at the request of DAUB, STEVE A, MD.  The consultation is for the evaluation of parkinsonism.  I have reviewed numerous records made available to me.  The patient previously saw Dr. [name] and subsequently Dr. [name].  He saw Dr. [name] when he was in the hospital in April, 2016.  That was for an event in which he experienced 1-2 minutes of speech and word finding trouble.  Dr. [name] doubted that was a TIA.  He had an EEG that was negative.  He had an MRI of the brain on 03/04/2015 without gadolinium that I had the opportunity to review.  There were a very few scattered T2 hyperintensities, but diffusion-weighted imaging was negative.  MRSA was negative.  He followed up with Dr. [name] one time on an outpatient basis, but nothing further was revealed.  Today, the patient presents because of shuffling of the feet for about 8 months.  Pt states that it started dragging of the L foot but now seems to be dragging both.  He has also noted handwriting and voice changes and has become concerned about the possibility of Parkinson's disease.  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  He also has a history of chemotherapy-induced peripheral neuropathy (Adcetris).  He asks about "psychogenic stuff."  States that he has been through divorce along with his cancer.  Asks if his sx's could be from stress as he has been reading about psychogenic manifestations of sx's.  08/23/16 update:  The patient follows up today.  He was seen as a work in appointment during the lunch hour.  He has been very anxious about his DaT scan results.  I did get a report, although I do not have the films.  There was abnormal grade 3 uptake, virtually absent bilaterally in both  putamen and caudate nuclei.  This would be consistent with a parkinsonian state.  He has not had any falls since last visit.  No lightheadedness or near syncope.  MRI of the brain was done on 03/04/2015.  I reviewed this again today.  There were just a few, rare scattered T2 hyperintensities.  None in the basal ganglia.    ALLERGIES:  No Known Allergies  CURRENT MEDICATIONS:  Outpatient Encounter Prescriptions as of 08/23/2016  Medication Sig  . acetaminophen (TYLENOL) 325 MG tablet Take 325 mg by mouth every 6 (six) hours as needed for mild pain.  Marland Kitchen ALPRAZolam (XANAX) 0.5 MG tablet take 1 tablet by mouth at bedtime if needed for sleep  . aspirin 81 MG chewable tablet Chew 1 tablet (81 mg total) by mouth daily.  . Cholecalciferol (VITAMIN D-3) 1000 UNITS CAPS Take 1 capsule by mouth daily.   Marland Kitchen LORazepam (ATIVAN) 1 MG tablet TAKE 1/2 A TABLET BY MOUTH TWICE DAILY DURING THE DAY FOR ANXIETY, AND 1 TABLET AT BEDTIME IF NEEDED  . Multiple Vitamin (MULTIVITAMIN) tablet Take 1 tablet by mouth daily.   No facility-administered encounter medications on file as of 08/23/2016.     PAST MEDICAL HISTORY:   Past Medical History:  Diagnosis Date  . Anxiety   . Dysrhythmia    past hx pvc  . H/O autologous stem cell transplant ([place])    may 2014  at  [facility]  . History of Bell's palsy    2009  RIGHT SIDE--  HAS 80% FUNCTION / PT STATES A LITTLE ASYMETRICAL AND EFFECTS MOUTH  . Hodgkin's disease, nodular sclerosis, of inguinal region/lower limb ([place]) ONOLOGIST--  DR ENNEVER AND A [facility]     S[facility] CHEMO 2013/  AUTOLOGUS STEM CELL TRANSPLANT MAY 2014 AT [facility]  . Mass of right inguinal region   . PVC (premature ventricular contraction)    "benign"  . TIA (transient ischemic attack) 02/2015   "probable TIA"  . Wears contact lenses     PAST SURGICAL HISTORY:   Past Surgical History:  Procedure Laterality Date  . AXILLARY LYMPH NODE BIOPSY Left 02/02/2013   Procedure: NEEDLE LOCALIZED AXILLARY LYMPH NODE  BIOPSY;  Surgeon: Haywood Lasso, MD;  Location: [place];  Service: General;  Laterality: Left;  . BONE MARROW BIOPSY  08/2012  . CYST REMOVAL NECK Left 1980  . LEFT INGUINAL LYMPH NODE BX  09-09-2012  . LYMPH NODE BIOPSY N/A 07/28/2015   Procedure: LYMPH NODE BIOPSY;  Surgeon: Melrose Nakayama, MD;  Location: [place];  Service: Thoracic;  Laterality: N/A;  . NODE DISSECTION Right 07/28/2015   Procedure: NODE DISSECTION;  Surgeon: Melrose Nakayama, MD;  Location: [place];  Service: Thoracic;  Laterality: Right;  . PLEURA BIOPSY Left 05/31/2014  . REMOVAL RIGHT INGUINAL LYMPH NODES  08-16-2011  . SCROTAL EXPLORATION Right 01/04/2014   Procedure: SCROTUM EXPLORATION   INGUINAL , EXCISION OF CYSTIC MASS OF RIGHT SPERMATIC CORD, WITH FROZEN SECTION;  Surgeon: Franchot Gallo, MD;  [facility];  Service: Urology;  Laterality: Right;  . TRANSTHORACIC ECHOCARDIOGRAM  03-18-2013   MILD LVH/  EF 55-60%  . VIDEO ASSISTED THORACOSCOPY Left 05/31/2014   Procedure: LEFT VIDEO ASSISTED THORACOSCOPY, PLEURAL BIOPSY;  Surgeon: Melrose Nakayama, MD;  Location: [place];  Service: Thoracic;  Laterality: Left;  Marland Kitchen VIDEO ASSISTED THORACOSCOPY Right 07/28/2015   Procedure: RIGHT VIDEO ASSISTED THORACOSCOPY;  Surgeon: Melrose Nakayama, MD;  Location: [place];  Service: Thoracic;  Laterality: Right;  Marland Kitchen VIDEO ASSISTED THORACOSCOPY (VATS)/ LYMPH NODE SAMPLING Right 07/28/2015  . VIDEO ASSISTED THORACOSCOPY (VATS)/WEDGE RESECTION  05/31/2014  . VIDEO BRONCHOSCOPY WITH ENDOBRONCHIAL ULTRASOUND  07/28/2015  . VIDEO BRONCHOSCOPY WITH ENDOBRONCHIAL ULTRASOUND N/A 07/28/2015   Procedure: VIDEO BRONCHOSCOPY WITH ENDOBRONCHIAL ULTRASOUND;  Surgeon: Melrose Nakayama, MD;  Location: [place];  Service: Thoracic;  Laterality: N/A;    SOCIAL HISTORY:   Social History   Social History  . Marital status: Legally Separated    Spouse name: N/A  . Number of children: N/A  . Years of  education: N/A   Occupational History  . Arboriculturist Communication   Social History Main Topics  . Smoking status: Never Smoker  . Smokeless tobacco: Never Used  . Alcohol use 8.4 oz/week    7 Cans of beer, 7 Glasses of wine per week  . Drug use: No  . Sexual activity: Not Currently   Other Topics Concern  . Not on file   Social History Narrative   Married. Education: [employer]. Exercise: jog/walk/ lift weights 7 days a week, 1-2 miles.    FAMILY HISTORY:   Family Status  Relation Status  . Mother Deceased  . Father Deceased  . Sister Alive  . Brother Alive  . Son Alive    ROS:  Feet and toe paresthesias with neuropathy.  A complete 10 system review of systems was obtained and  was unremarkable apart from what is mentioned above.  PHYSICAL EXAMINATION:    VITALS:   There were no vitals filed for this visit.  GEN:  The patient appears stated age and is in NAD. HEENT:  Normocephalic, atraumatic.  The mucous membranes are moist. The superficial temporal arteries are without ropiness or tenderness. CV:  RRR Lungs:  CTAB Neck/HEME:  There are no carotid bruits bilaterally.  Neurological examination:  Orientation: The patient is alert and oriented x3. Fund of knowledge is appropriate.  Recent and remote memory are intact.  Attention and concentration are normal.    Able to name objects and repeat phrases. Cranial nerves: There is slight right facial droop, on a noticeable because of decreased nasolabial fold on the right.  He is able to symmetrically activate the muscles of facial expression.  He does have abnormal synkinetic reinnervation of the muscles of facial expression on the right.  He does not have a Bell's phenomenon.  There is facial hypomimia.  Pupils are equal round and reactive to light bilaterally. Fundoscopic exam reveals clear margins bilaterally. Extraocular muscles are intact. The visual fields are full to confrontational testing. The speech is fluent and clear.   He is hypophonic.  Soft palate rises symmetrically and there is no tongue deviation. Hearing is intact to conversational tone. Sensation: Sensation is intact to light and pinprick throughout (facial, trunk, extremities). Vibration is intact at the bilateral big toe in just slightly decreased. There is no extinction with double simultaneous stimulation. There is no sensory dermatomal level identified. Motor: Strength is 5/5 in the bilateral upper and lower extremities.   Shoulder shrug is equal and symmetric.  There is no pronator drift. Deep tendon reflexes: Deep tendon reflexes are 3/4 at the bilateral biceps, triceps, brachioradialis, patella and achilles. There are bilateral pectoralis reflexes.  Plantar responses are downgoing bilaterally.  Movement examination: Tone: He has significant difficulty relaxing the arms for testing of rigidity, but there does appear to be increased tone in the right upper extremity.  However, when performing distraction procedures, the increased tone actually appears just a bit better, although it is still present Abnormal movements: Mild tremor can be felt at rest, but is not able to be seen.  This is in both arms, right more than left.  This is more of a tremulousness than true tremor. Coordination:  There is slowness with virtually all forms of rapid alternating movements and decremation with most forms of rapid alternating movements, but it does improve with encouragement.  Gait and Station: The patient has no difficulty arising out of a deep-seated chair without the use of the hands. The patient's stride length is normal, but he has foot drop on the right when he walks (is not evident when the patient is just examined with manual motor testing).  He is able to run down the hall normally.  The patient has a negative pull test.    He sways in the Romberg position with eyes closed.  ASSESSMENT/PLAN:  1.  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  In addition, he is diffusely hyperreflexic, which would be somewhat odd in a patient who has defined peripheral neuropathy.  I told him that I thought he needed an MRI of the cervical spine, given diffuse hyperreflexia and increased tone on the right.  However, he was very focused on the fact that he had no neck or back pain.  I told him that this  can happen, but should still be explored.  Unfortunately, he has a high deductible plan and would have to pay the full cost of the MRI of the cervical spine and opted not to do this.  He had an MRI of the brain about in 02/2015 that just demonstrated a few scattered T2 hyperintensities and was otherwise unremarkable.  -He had a DaT scan on 08/16/2016.  There was abnormal grade 3 uptake, virtually absent bilaterally in both putamen and caudate nuclei.  -We talked about the nature, etiology and pathophysiology as well as the degenerative nature.  We discussed medications for this and what the expectations with medication would be.  The patient ultimately decided to try pramipexole and work to 0.5 mg tid.  Talked about r/b/se.  Talked about sleep attack risk/risk of compulsive behaviors.  -Parkinsonism.  I suspect that this does represent idiopathic Parkinson's disease.  The patient has tremor, bradykinesia, rigidity and mild postural instability.  -We discussed the diagnosis as well as pathophysiology of the disease.  We discussed treatment options as well as prognostic indicators.  Patient education was provided.  -We talked about safety in the home.  -We discussed community resources in the area including patient support groups and community exercise programs for PD and pt education was provided to the patient.   2.  Chemo (adcetris) induced peripheral neuropathy  -The patient has only mild clinical examination evidence of a diffuse peripheral neuropathy, and I do not think this can explain all of his symptoms.   I think it does explain his paresthesias that he has had since chemotherapy, but I am not sure it can explain his gait changes, and definitely cannot explain his hyperreflexia, which would be unusual in a neuropathy patient.  He may have always been hyperreflexic, which I explained to him, but since this is my first time seeing him, I do not note this.   3.  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  4.  Hodgkins lymphoma, s/p autologous transplant with recurrence post transplant  5.  2016 transient (1-2 min) of word/speech trouble  -reviewed hospital records and agree that this likely did not represent TIA and if anything, more consistent with seizure.  In any case, the event has not recurred.  6.  Follow-up with me after the above testing has been completed.  Pt education provided.  Pt with multiple questions and answered to best of my ability.  Much greater than 50% of this visit was spent in counseling and coordinating care.  Total face to face time:  60 min.  Did not include the 35 min in record review that was non face to face time prior to the visit.

## 2016-08-22 ENCOUNTER — Ambulatory Visit (INDEPENDENT_AMBULATORY_CARE_PROVIDER_SITE_OTHER): Payer: Commercial Managed Care - PPO | Admitting: Neurology

## 2016-08-22 ENCOUNTER — Encounter: Payer: Self-pay | Admitting: Neurology

## 2016-08-22 VITALS — BP 140/88 | HR 45 | Ht 70.0 in | Wt 169.2 lb

## 2016-08-22 DIAGNOSIS — F331 Major depressive disorder, recurrent, moderate: Secondary | ICD-10-CM

## 2016-08-22 DIAGNOSIS — G20 Parkinson's disease: Secondary | ICD-10-CM | POA: Diagnosis not present

## 2016-08-22 MED ORDER — PRAMIPEXOLE DIHYDROCHLORIDE 0.5 MG PO TABS: 0.5000 mg | ORAL_TABLET | Freq: Three times a day (TID) | ORAL | 0 refills | Status: DC

## 2016-08-22 MED ORDER — PRAMIPEXOLE DIHYDROCHLORIDE 0.125 MG PO TABS: ORAL_TABLET | ORAL | 0 refills | Status: DC

## 2016-08-22 NOTE — Patient Instructions (Addendum)
1. You have been referred to [facility] for physical, occupational, and speech therapy. They will call you directly to schedule an appointment.  Please call [phone] if you do not hear from them.   2. Start mirapex (pramipexole) as follows:  0.125 mg - 1 tablet three times per day for a week, then 2 tablets three times per day for a week and then fill the 0.5 mg tablet and take that, 1 pill three times per day. Both prescriptions have been sent to your pharmacy.   3. Sheralyn Boatman, psychiatrist, is located at [facility] in [place]. You can contact their office to make an appt at [phone].

## 2016-08-23 ENCOUNTER — Ambulatory Visit: Payer: Commercial Managed Care - PPO | Admitting: Neurology

## 2016-08-28 ENCOUNTER — Ambulatory Visit: Payer: Commercial Managed Care - PPO | Admitting: Neurology

## 2016-09-26 ENCOUNTER — Encounter: Payer: Commercial Managed Care - PPO | Admitting: Occupational Therapy

## 2016-09-26 ENCOUNTER — Ambulatory Visit: Payer: Commercial Managed Care - PPO

## 2016-09-26 ENCOUNTER — Ambulatory Visit: Payer: Commercial Managed Care - PPO | Admitting: Physical Therapy

## 2016-10-04 ENCOUNTER — Ambulatory Visit: Payer: Commercial Managed Care - PPO | Admitting: Occupational Therapy

## 2016-10-08 ENCOUNTER — Telehealth: Payer: Self-pay | Admitting: Neurology

## 2016-10-08 NOTE — Telephone Encounter (Signed)
Those meds are not medications I prescribe, nor are they indicated for the treatment of PD.  Last visit, I gave him psychiatry names.  Did he pursue that?

## 2016-10-08 NOTE — Telephone Encounter (Signed)
Pt called and is asking if he can take a low dose Xanax at night.  Please call pat if he doesn't answer leave answer on his vm.

## 2016-10-08 NOTE — Telephone Encounter (Signed)
Looks like patient has an Careers information officer for Xanax at bedtime PRN and Ativan 1/2 tablet BID and one at night for sleep.  Please advise.

## 2016-10-08 NOTE — Telephone Encounter (Signed)
Spoke with patient and let him know that Xanax is not preferred but he should have a psychiatrist to manage medications for anxiety. He states that he sees a Social worker and does not need a psychiatrist. I recommended that this would be a good idea.

## 2016-10-22 NOTE — Telephone Encounter (Signed)
Error

## 2016-10-23 ENCOUNTER — Ambulatory Visit: Payer: Commercial Managed Care - PPO | Admitting: Physical Therapy

## 2016-10-23 ENCOUNTER — Ambulatory Visit: Payer: Commercial Managed Care - PPO | Attending: Neurology | Admitting: Physical Therapy

## 2016-10-23 ENCOUNTER — Encounter: Payer: Commercial Managed Care - PPO | Admitting: Speech Pathology

## 2016-10-23 DIAGNOSIS — R2681 Unsteadiness on feet: Secondary | ICD-10-CM | POA: Diagnosis present

## 2016-10-23 DIAGNOSIS — R2689 Other abnormalities of gait and mobility: Secondary | ICD-10-CM | POA: Diagnosis present

## 2016-10-23 DIAGNOSIS — R293 Abnormal posture: Secondary | ICD-10-CM

## 2016-10-23 NOTE — Therapy (Signed)
[facility] [address] [place], [place], [address] Phone: [phone]   Fax:  [phone]  Physical Therapy Treatment  Patient Details  Name: Brian [name] MRN: [id number] Date of Birth: Jan 03, 1961 Referring Provider: Alonza Bogus, DO  Encounter Date: 10/23/2016      PT End of Session - 10/23/16 1334    Visit Number 1   Number of Visits 9   Date for PT Re-Evaluation 12/22/16   Authorization Type UMR   PT Start Time 0933   PT Stop Time 1017   PT Time Calculation (min) 44 min   Activity Tolerance Patient tolerated treatment well   Behavior During Therapy [facility] for tasks assessed/performed      Past Medical History:  Diagnosis Date  . Anxiety   . Dysrhythmia    past hx pvc  . H/O autologous stem cell transplant ([place])    may 2014  at [facility]  . History of Bell's palsy    2009  RIGHT SIDE--  HAS 80% FUNCTION / PT STATES A LITTLE ASYMETRICAL AND EFFECTS MOUTH  . Hodgkin's disease, nodular sclerosis, of inguinal region/lower limb ([place]) ONOLOGIST--  DR ENNEVER AND A [facility]     SALVAGE CHEMO 2013/  AUTOLOGUS STEM CELL TRANSPLANT MAY 2014 AT [facility]  . Mass of right inguinal region   . PVC (premature ventricular contraction)    "benign"  . TIA (transient ischemic attack) 02/2015   "probable TIA"  . Wears contact lenses     Past Surgical History:  Procedure Laterality Date  . AXILLARY LYMPH NODE BIOPSY Left 02/02/2013   Procedure: NEEDLE LOCALIZED AXILLARY LYMPH NODE BIOPSY;  Surgeon: Haywood Lasso, MD;  Location: [place];  Service: General;  Laterality: Left;  . BONE MARROW BIOPSY  08/2012  . CYST REMOVAL NECK Left 1980  . LEFT INGUINAL LYMPH NODE BX  09-09-2012  . LYMPH NODE BIOPSY N/A 07/28/2015   Procedure: LYMPH NODE BIOPSY;  Surgeon: Melrose Nakayama, MD;  Location: [place];  Service: Thoracic;  Laterality: N/A;  . NODE DISSECTION Right 07/28/2015   Procedure: NODE DISSECTION;  Surgeon: Melrose Nakayama, MD;  Location: [place];  Service: Thoracic;  Laterality: Right;  . PLEURA BIOPSY Left 05/31/2014  . REMOVAL RIGHT INGUINAL LYMPH NODES  08-16-2011  . SCROTAL EXPLORATION Right 01/04/2014   Procedure: SCROTUM EXPLORATION   INGUINAL , EXCISION OF CYSTIC MASS OF RIGHT SPERMATIC CORD, WITH FROZEN SECTION;  Surgeon: Franchot Gallo, MD;  Location: [facility];  Service: Urology;  Laterality: Right;  . TRANSTHORACIC ECHOCARDIOGRAM  03-18-2013   MILD LVH/  EF 55-60%  . VIDEO ASSISTED THORACOSCOPY Left 05/31/2014   Procedure: LEFT VIDEO ASSISTED THORACOSCOPY, PLEURAL BIOPSY;  Surgeon: Melrose Nakayama, MD;  Location: [place];  Service: Thoracic;  Laterality: Left;  Marland Kitchen VIDEO ASSISTED THORACOSCOPY Right 07/28/2015   Procedure: RIGHT VIDEO ASSISTED THORACOSCOPY;  Surgeon: Melrose Nakayama, MD;  Location: [place];  Service: Thoracic;  Laterality: Right;  Marland Kitchen VIDEO ASSISTED THORACOSCOPY (VATS)/ LYMPH NODE SAMPLING Right 07/28/2015  . VIDEO ASSISTED THORACOSCOPY (VATS)/WEDGE RESECTION  05/31/2014  . VIDEO BRONCHOSCOPY WITH ENDOBRONCHIAL ULTRASOUND  07/28/2015  . VIDEO BRONCHOSCOPY WITH ENDOBRONCHIAL ULTRASOUND N/A 07/28/2015   Procedure: VIDEO BRONCHOSCOPY WITH ENDOBRONCHIAL ULTRASOUND;  Surgeon: Melrose Nakayama, MD;  Location: [place];  Service: Thoracic;  Laterality: N/A;    There were no vitals filed for this visit.      Subjective Assessment - 10/23/16 2458  Subjective I'm very frustrated -started the Mirapex, but maybe noticed slight differences.  Started dragging L foot about a year and a half ago.  Some days are good, some are harder to move.  Pt feels L side is more affected.  Has had a few stumbles, but no falls.   Pertinent History Lymphoma 3x in 4 years   Patient Stated Goals Pt's goal for therapy is to improve general mobility and everyday getting around.   Currently in Pain? No/denies            [facility] PT Assessment - 10/23/16 4967      Assessment   Medical  Diagnosis Parkinson's disease   Referring Provider Wells Guiles Tat, DO   Onset Date/Surgical Date --  September 2017 Dr. Carles Collet visit     Precautions   Precautions Fall     Balance Screen   Has the patient fallen in the past 6 months No   Has the patient had a decrease in activity level because of a fear of falling?  No   Is the patient reluctant to leave their home because of a fear of falling?  No     Home Environment   Living Environment Private residence   Living Arrangements Alone  Recently divorced, 2 boys several days/week   Available Help at Discharge Family   Type of [place] Access Level entry   [place] Two level;Able to live on main level with bedroom/bathroom   Home Equipment --  Exercise bike at home   Additional Comments Rides exercise bike at home; walks the dog; has stand-up desk at home     Prior Function   Level of [place] Full time Sports administrator at [employer] Requirements sitting, computer work; has Herbalist at work   Leisure Enjoys biking, hiking, was a runner     Haematologist Postural limitations   Postural Limitations Forward head     ROM / Strength   AROM / PROM / Strength Strength     Strength   Overall Strength Deficits   Overall Strength Comments grossly tested at least 4/5, 3+/5 bilateral dorsiflexion     Transfers   Transfers Sit to Stand;Stand to Sit   Sit to Stand 6: Modified independent (Device/Increase time);From chair/3-in-1   Five time sit to stand comments  9.26 sec   Stand to Sit 6: Modified independent (Device/Increase time);To chair/3-in-1;Without upper extremity assist  Reports some difficulty getting out of car     Ambulation/Gait   Ambulation/Gait Yes   Ambulation/Gait Assistance 5: Supervision;6: Modified independent (Device/Increase time)   Ambulation Distance (Feet) 300 Feet   Assistive device None   Gait Pattern  Step-through pattern;Decreased arm swing - right;Decreased arm swing - left;Decreased step length - right;Decreased step length - left;Decreased stance time - left;Decreased dorsiflexion - right;Decreased dorsiflexion - left;Shuffle;Decreased trunk rotation;Trunk rotated posteriorly on left;Narrow base of support;Poor foot clearance - left;Poor foot clearance - right  forward flexed posture   Ambulation Surface Level;Indoor   Gait velocity 8.87 sec = 3.7 ft/sec     Standardized Balance Assessment   Standardized Balance Assessment Timed Up and Go Test     Timed Up and Go Test   Normal TUG (seconds) 10.1   Manual TUG (seconds) 11.48   Cognitive TUG (seconds) 9.95   TUG Comments Forward flexed posture, anterior lean, decreased foot clearance with turns  High Level Balance   High Level Balance Comments Push and release test posterior:  2 steps backwards; forward direction:  2 steps forward     Functional Gait  Assessment   Gait assessed  Yes   Gait Level Surface Walks 20 ft in less than 7 sec but greater than 5.5 sec, uses assistive device, slower speed, mild gait deviations, or deviates 6-10 in outside of the 12 in walkway width.  6.72   Change in Gait Speed Able to change speed, demonstrates mild gait deviations, deviates 6-10 in outside of the 12 in walkway width, or no gait deviations, unable to achieve a major change in velocity, or uses a change in velocity, or uses an assistive device.   Gait with Horizontal Head Turns Performs head turns with moderate changes in gait velocity, slows down, deviates 10-15 in outside 12 in walkway width but recovers, can continue to walk.   Gait with Vertical Head Turns Performs task with moderate change in gait velocity, slows down, deviates 10-15 in outside 12 in walkway width but recovers, can continue to walk.   Gait and Pivot Turn Pivot turns safely within 3 sec and stops quickly with no loss of balance.   Step Over Obstacle Is able to step over one  shoe box (4.5 in total height) without changing gait speed. No evidence of imbalance.   Gait with Narrow Base of Support Is able to ambulate for 10 steps heel to toe with no staggering.   Gait with Eyes Closed Walks 20 ft, slow speed, abnormal gait pattern, evidence for imbalance, deviates 10-15 in outside 12 in walkway width. Requires more than 9 sec to ambulate 20 ft.  14.86 sec   Ambulating Backwards Walks 20 ft, uses assistive device, slower speed, mild gait deviations, deviates 6-10 in outside 12 in walkway width.  12.82 sec   Steps Alternating feet, must use rail.   Total Score 19   FGA comment: Scores <22/30 in community dwelling adults indicates increased fall risk.                             PT Education - 10/23/16 1351    Education provided Yes   Education Details Discussed POC, Parkinson's symptoms, recommendations for OT and speech based on pt's reported difficulties (pt declines at this time); discussed large amplitude movement patterns with brief practice with gait and standing positions   Person(s) Educated Patient   Methods Explanation;Demonstration   Comprehension Verbalized understanding;Need further instruction             PT Long Term Goals - 10/23/16 1344      PT LONG TERM GOAL #1   Title Pt will be independent with Parkinson's specific HEP for improved posture, balance, and gait.  TARGET 11/22/16   Time 4   Period Weeks   Status New     PT LONG TERM GOAL #2   Title Pt will perform at least 8 of 10 reps of sit<>stand transfers from surfaces, 18 inches or lower, with initiation of gait with no loss of balance, for improved transfer efficiency and safety.   Time 4   Period Weeks   Status New     PT LONG TERM GOAL #3   Title Pt will improve Functional Gait Assessment to at least 22/30 for decreased fall risk.   Time 4   Period Weeks   Status New     PT LONG TERM GOAL #4  Title Pt will verbalize understanding of local Parkinson's  disease resources, including support group and exercise options.   Time 4   Period Weeks   Status New     PT LONG TERM GOAL #5   Title Pt will verbalize understanding of fall prevention in the home environment.   Time 4   Period Weeks   Status New               Plan - 10/23/16 1335    Clinical Impression Statement Pt is a 55 year old male who presents to OP PT with history of Hodgkin's lymphoma, Bell's Palsy, anxiety, and probable TIA, with recent diagnosis of Parkinson's disease.  Pt presents with tremor, rigidity, abnormal posture, decreased timing and coordination with gait.  Pt is currently working, and feels that Parkinson's is affecting balance and ability to participate in running/exercise activities.  Pt will benefit from skilled physical therapy to address the above stated deficits to improve functional mobility and decreased fall risk.   Rehab Potential Good   PT Frequency 2x / week   PT Duration 4 weeks   PT Treatment/Interventions ADLs/Self Care Home Management;Functional mobility training;Gait training;Therapeutic activities;Therapeutic exercise;Balance training;Neuromuscular re-education;Patient/family education;Orthotic Fit/Training   PT Next Visit Plan Initiate transfer training, PWR! Moves in standing, sitting, and gait training with emphasis on large amplitude movement patterns   Recommended Other Services Recommend OT and speech therapy evaluations (as did Dr. Carles Collet) based on pt's reported difficulties-pt declines at this time.   Consulted and Agree with Plan of Care Patient      Patient will benefit from skilled therapeutic intervention in order to improve the following deficits and impairments:  Abnormal gait, Decreased balance, Decreased mobility, Decreased strength, Difficulty walking, Postural dysfunction, Impaired tone  Visit Diagnosis: Other abnormalities of gait and mobility  Abnormal posture  Unsteadiness on feet     Problem List Patient Active  Problem List   Diagnosis Date Noted  . Lymphadenopathy 07/28/2015  . Mediastinal adenopathy 07/14/2015  . Aphasia 03/04/2015  . Slurred speech 03/04/2015  . Pleural mass 05/31/2014  . Abdominal pain, epigastric 02/02/2014  . Weight loss 08/12/2012  . H/O Bell's palsy 08/12/2012  . Hodgkin's disease 08/09/2011    Brian Kisling W. 10/23/2016, 1:53 PM  Frazier Butt., PT [facility] [address] [place], [place], [address] Phone: [phone]   Fax:  [phone]  Name: Brian [name] MRN: [id number] Date of Birth: 07-Jan-1961

## 2016-10-24 ENCOUNTER — Other Ambulatory Visit: Payer: Self-pay | Admitting: Hematology & Oncology

## 2016-10-24 DIAGNOSIS — C8195 Hodgkin lymphoma, unspecified, lymph nodes of inguinal region and lower limb: Secondary | ICD-10-CM

## 2016-11-05 ENCOUNTER — Other Ambulatory Visit: Payer: Self-pay | Admitting: Hematology & Oncology

## 2016-11-07 ENCOUNTER — Ambulatory Visit: Payer: Commercial Managed Care - PPO | Attending: Neurology | Admitting: Physical Therapy

## 2016-11-07 DIAGNOSIS — R2681 Unsteadiness on feet: Secondary | ICD-10-CM | POA: Insufficient documentation

## 2016-11-07 DIAGNOSIS — R2689 Other abnormalities of gait and mobility: Secondary | ICD-10-CM | POA: Insufficient documentation

## 2016-11-07 DIAGNOSIS — R293 Abnormal posture: Secondary | ICD-10-CM | POA: Diagnosis present

## 2016-11-08 NOTE — Therapy (Signed)
[facility] [address] [place], [place], [address] Phone: [phone]   Fax:  [phone]  Physical Therapy Treatment  Patient Details  Name: Brian [name] MRN: [id number] Date of Birth: 1961-09-22 Referring Provider: Alonza Bogus, DO  Encounter Date: 11/07/2016      PT End of Session - 11/08/16 1804    Visit Number 2   Number of Visits 9   Date for PT Re-Evaluation 12/22/16   Authorization Type UMR   PT Start Time 1103   PT Stop Time 1148   PT Time Calculation (min) 45 min   Activity Tolerance Patient tolerated treatment well   Behavior During Therapy [facility] for tasks assessed/performed      Past Medical History:  Diagnosis Date  . Anxiety   . Dysrhythmia    past hx pvc  . H/O autologous stem cell transplant ([place])    may 2014  at [facility]  . History of Bell's palsy    2009  RIGHT SIDE--  HAS 80% FUNCTION / PT STATES A LITTLE ASYMETRICAL AND EFFECTS MOUTH  . Hodgkin's disease, nodular sclerosis, of inguinal region/lower limb ([place]) ONOLOGIST--  DR [facility]NEVER AND A [facility]     SALVAGE CHEMO 2013/  AUTOLOGUS STEM CELL TRANSPLANT MAY 2014 AT [facility]  . Mass of right inguinal region   . PVC (premature ventricular contraction)    "benign"  . TIA (transient ischemic attack) 02/2015   "probable TIA"  . Wears contact lenses     Past Surgical History:  Procedure Laterality Date  . AXILLARY LYMPH NODE BIOPSY Left 02/02/2013   Procedure: NEEDLE LOCALIZED AXILLARY LYMPH NODE BIOPSY;  Surgeon: Haywood Lasso, MD;  Location: [place];  Service: General;  Laterality: Left;  . BONE MARROW BIOPSY  08/2012  . CYST REMOVAL NECK Left 1980  . LEFT INGUINAL LYMPH NODE BX  09-09-2012  . LYMPH NODE BIOPSY N/A 07/28/2015   Procedure: LYMPH NODE BIOPSY;  Surgeon: Melrose Nakayama, MD;  Location: [place];  Service: Thoracic;  Laterality: N/A;  . NODE DISSECTION Right 07/28/2015   Procedure: NODE DISSECTION;  Surgeon: Melrose Nakayama, MD;  Location: [place];  Service: Thoracic;  Laterality: Right;  . PLEURA BIOPSY Left 05/31/2014  . REMOVAL RIGHT INGUINAL LYMPH NODES  08-16-2011  . SCROTAL EXPLORATION Right 01/04/2014   Procedure: SCROTUM EXPLORATION   INGUINAL , EXCISION OF CYSTIC MASS OF RIGHT SPERMATIC CORD, WITH FROZEN SECTION;  Surgeon: Franchot Gallo, MD;  Location: [facility];  Service: Urology;  Laterality: Right;  . TRANSTHORACIC ECHOCARDIOGRAM  03-18-2013   MILD LVH/  EF 55-60%  . VIDEO ASSISTED THORACOSCOPY Left 05/31/2014   Procedure: LEFT VIDEO ASSISTED THORACOSCOPY, PLEURAL BIOPSY;  Surgeon: Melrose Nakayama, MD;  Location: [place];  Service: Thoracic;  Laterality: Left;  Marland Kitchen VIDEO ASSISTED THORACOSCOPY Right 07/28/2015   Procedure: RIGHT VIDEO ASSISTED THORACOSCOPY;  Surgeon: Melrose Nakayama, MD;  Location: [place];  Service: Thoracic;  Laterality: Right;  Marland Kitchen VIDEO ASSISTED THORACOSCOPY (VATS)/ LYMPH NODE SAMPLING Right 07/28/2015  . VIDEO ASSISTED THORACOSCOPY (VATS)/WEDGE RESECTION  05/31/2014  . VIDEO BRONCHOSCOPY WITH ENDOBRONCHIAL ULTRASOUND  07/28/2015  . VIDEO BRONCHOSCOPY WITH ENDOBRONCHIAL ULTRASOUND N/A 07/28/2015   Procedure: VIDEO BRONCHOSCOPY WITH ENDOBRONCHIAL ULTRASOUND;  Surgeon: Melrose Nakayama, MD;  Location: [place];  Service: Thoracic;  Laterality: N/A;    There were no vitals filed for this visit.      Subjective Assessment - 11/07/16 1105  Subjective Some days better than others, still taking the medication and riding stationary bike at home.   Pertinent History Lymphoma 3x in 4 years   Patient Stated Goals Pt's goal for therapy is to improve general mobility and everyday getting around.   Currently in Pain? No/denies                         [facility] Adult PT Treatment/Exercise - 11/08/16 0001      Ambulation/Gait   Ambulation/Gait Yes   Ambulation/Gait Assistance 5: Supervision;6: Modified independent (Device/Increase time)   Ambulation/Gait  Assistance Details PT provides cues for upright posture, increased heelstrike, increased L step length as well as timing and coordination of L swing through phase of gait.  Utilized walking poles to faciliate arm swing and increased step length.   Ambulation Distance (Feet) 500 Feet  x 2 reps   Assistive device None  bilateral walking poles for arm swing facilitation   Gait Pattern Step-through pattern;Decreased arm swing - right;Decreased arm swing - left;Decreased step length - right;Decreased step length - left;Decreased stance time - left;Decreased dorsiflexion - right;Decreased dorsiflexion - left;Shuffle;Decreased trunk rotation;Trunk rotated posteriorly on left;Narrow base of support;Poor foot clearance - left;Poor foot clearance - right  forward flexed posture with gait; improved briefly with cues   Ambulation Surface Level;Indoor   Gait Comments Provided cues for upright posture, importance of increased awareness of movement and increased intensity/amplitude of movement  Pt needs frequent VCs during gait            PWR [facility]) - 11/07/16 1109    PWR! exercises Moves in sitting;Moves in standing   PWR! Up x 20    PWR! Rock x 20   PWR! Twist x 20   PWR Step x 20   Comments Verbal and visual cues provided for PWR! Moves in standing.  Cues for large amplitude, increased intensity of movement.  Discussed correlation of each exercise to functional activities.   PWR! Up x 20    PWR! Rock x 20   PWR! Twist  x 20   PWR! Step  x 20   Comments Verbal and visual cues provided for PWR! Moves in sitting.  Cues for increased intensity/large amplitude movement patterns, with discussion on how each exercise correlates to functional activities.             PT Education - 11/08/16 1803    Education provided Yes   Education Details HEP initiation-see instructions, cues for awareness and intensity of movement patterns   Person(s) Educated Patient   Methods Explanation;Demonstration    Comprehension Verbalized understanding;Returned demonstration;Verbal cues required;Need further instruction             PT Long Term Goals - 10/23/16 1344      PT LONG TERM GOAL #1   Title Pt will be independent with Parkinson's specific HEP for improved posture, balance, and gait.  TARGET 11/22/16   Time 4   Period Weeks   Status New     PT LONG TERM GOAL #2   Title Pt will perform at least 8 of 10 reps of sit<>stand transfers from surfaces, 18 inches or lower, with initiation of gait with no loss of balance, for improved transfer efficiency and safety.   Time 4   Period Weeks   Status New     PT LONG TERM GOAL #3   Title Pt will improve Functional Gait Assessment to at least 22/30 for decreased fall risk.  Time 4   Period Weeks   Status New     PT LONG TERM GOAL #4   Title Pt will verbalize understanding of local Parkinson's disease resources, including support group and exercise options.   Time 4   Period Weeks   Status New     PT LONG TERM GOAL #5   Title Pt will verbalize understanding of fall prevention in the home environment.   Time 4   Period Weeks   Status New               Plan - 11/08/16 1805    Clinical Impression Statement Focused treatment session today on initiation of HEP, including PWR! MOves in sitting and standing, to address large amplitude, intensity of movements for posture, weightshifting, trunk rotation, and transition stepping.  Pt needs frequent cues during exercises and gait to reset intensity of movement patterns.  Encouraged patient to schedule additional appointments and discussed importance of repetition and consistency of therapy in improving functional mobility.   Rehab Potential Good   PT Frequency 2x / week   PT Duration 4 weeks   PT Treatment/Interventions ADLs/Self Care Home Management;Functional mobility training;Gait training;Therapeutic activities;Therapeutic exercise;Balance training;Neuromuscular  re-education;Patient/family education;Orthotic Fit/Training   PT Next Visit Plan Review PWR! Moves in sitting and standing with emphasis on large amplitude, deliberate, intensity of movements, gait with intensity; may try foot-up brace on L to assist with heelstrike   Consulted and Agree with Plan of Care Patient      Patient will benefit from skilled therapeutic intervention in order to improve the following deficits and impairments:  Abnormal gait, Decreased balance, Decreased mobility, Decreased strength, Difficulty walking, Postural dysfunction, Impaired tone  Visit Diagnosis: Other abnormalities of gait and mobility  Abnormal posture  Unsteadiness on feet     Problem List Patient Active Problem List   Diagnosis Date Noted  . Lymphadenopathy 07/28/2015  . Mediastinal adenopathy 07/14/2015  . Aphasia 03/04/2015  . Slurred speech 03/04/2015  . Pleural mass 05/31/2014  . Abdominal pain, epigastric 02/02/2014  . Weight loss 08/12/2012  . H/O Bell's palsy 08/12/2012  . Hodgkin's disease 08/09/2011    Almedia Cordell W. 11/08/2016, 6:08 PM  Frazier Butt., PT  [facility] [address] [place], [place], [address] Phone: [phone]   Fax:  [phone]  Name: KOSTA SCHNITZLER MRN: [id number] Date of Birth: 03/10/1961

## 2016-11-15 NOTE — Progress Notes (Signed)
Brian [name] was seen today in the movement disorders clinic for neurologic consultation at the request of DAUB, STEVE A, MD.  The consultation is for the evaluation of parkinsonism.  I have reviewed numerous records made available to me.  The patient previously saw Dr. [name] and subsequently Dr. [name].  He saw Dr. [name] when he was in the hospital in April, 2016.  That was for an event in which he experienced 1-2 minutes of speech and word finding trouble.  Dr. [name] doubted that was a TIA.  He had an EEG that was negative.  He had an MRI of the brain on 03/04/2015 without gadolinium that I had the opportunity to review.  There were a very few scattered T2 hyperintensities, but diffusion-weighted imaging was negative.  MRSA was negative.  He followed up with Dr. [name] one time on an outpatient basis, but nothing further was revealed.  Today, the patient presents because of shuffling of the feet for about 8 months.  Pt states that it started dragging of the L foot but now seems to be dragging both.  He has also noted handwriting and voice changes and has become concerned about the possibility of Parkinson's disease.  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  He also has a history of chemotherapy-induced peripheral neuropathy (Adcetris).  He asks about "psychogenic stuff."  States that he has been through divorce along with his cancer.  Asks if his sx's could be from stress as he has been reading about psychogenic manifestations of sx's.  08/22/16 update: The patient follows up today for testing results.  This patient is accompanied in the office by his ex wife who supplements the history.  He had a DaT scan done on 08/16/16 and showed abnormal grade 3 uptake, virtually absent bilaterally in both putamen and caudate nuclei.   He  has not had any falls since last visit.  Continues to have difficulty with picking up the feet.  He denies lightheadedness or near syncope.  He denies hallucinations.  He continues to have issues with depression.  No SI/HI.  Ex wife concerned about mood.  Also noting stiffness, some drooling, constipation.    11/27/16 update: The patient follows up today, accompanied by his ex-wife (and friend) who supplements the history.  The patient was started on pramipexole last visit and worked up to 0.5 mg 3 times per day.  He denies any compulsive behaviors or sleep attacks.  He states that he is doing better but not as good as he thought that he would.  He is shuffling some.  He is exercising with his bike.  He has been only to 2 physical therapy sessions.  He declined speech and occupational therapy, despite the fact that the therapist recommended them as did I.  He has not had any falls.  Denies lightheadedness or syncope.  He continues to struggle with depression but states that mood has been better.  Last visit, he was given names of psychiatrist but refused to go, stating that he did not need a psychiatrist and had a Social worker.  He called me for a prescription for Xanax, but this is not a medication that I generally recommended my Parkinson's patients.   ALLERGIES:  No Known Allergies  CURRENT MEDICATIONS:  Outpatient Encounter Prescriptions as of  11/27/2016  Medication Sig  . acetaminophen (TYLENOL) 325 MG tablet Take 325 mg by mouth every 6 (six) hours as needed for mild pain.  Marland Kitchen aspirin 81 MG chewable tablet Chew 1 tablet (81 mg total) by mouth daily.  . Cholecalciferol (VITAMIN D-3) 1000 UNITS CAPS Take 1 capsule by mouth daily.   . Multiple Vitamin (MULTIVITAMIN) tablet Take 1 tablet by mouth daily.  . [DISCONTINUED] pramipexole (MIRAPEX) 0.5 MG tablet Take 1 tablet (0.5 mg total) by mouth 3 (three) times daily.  . pramipexole (MIRAPEX) 1 MG tablet Take 1 tablet (1 mg total) by mouth 3 (three) times  daily.  . [DISCONTINUED] ALPRAZolam (XANAX) 0.5 MG tablet take 1 tablet by mouth at bedtime if needed for sleep  . [DISCONTINUED] LORazepam (ATIVAN) 1 MG tablet TAKE 1/2 A TABLET BY MOUTH TWICE DAILY DURING THE DAY FOR ANXIETY, AND 1 TABLET AT BEDTIME IF NEEDED (Patient not taking: Reported on 10/23/2016)  . [DISCONTINUED] pramipexole (MIRAPEX) 0.125 MG tablet 1 tablet TID for a week, then 2 tablets TID for a week (then switch to 0.5 mg) (Patient not taking: Reported on 10/23/2016)   No facility-administered encounter medications on file as of 11/27/2016.     PAST MEDICAL HISTORY:   Past Medical History:  Diagnosis Date  . Anxiety   . Dysrhythmia    past hx pvc  . H/O autologous stem cell transplant ([place])    may 2014  at [facility]  . History of Bell's palsy    2009  RIGHT SIDE--  HAS 80% FUNCTION / PT STATES A LITTLE ASYMETRICAL AND EFFECTS MOUTH  . Hodgkin's disease, nodular sclerosis, of inguinal region/lower limb ([place]) ONOLOGIST--  DR [facility]NE[facility] AND A [facility]     SALVAGE CHEMO 2013/  AUTOLOGUS STEM CELL TRANSPLANT MAY 2014 AT [facility]  . Mass of right inguinal region   . PVC (premature ventricular contraction)    "benign"  . TIA (transient ischemic attack) 02/2015   "probable TIA"  . Wears contact lenses     PAST SURGICAL HISTORY:   Past Surgical History:  Procedure Laterality Date  . AXILLARY LYMPH NODE BIOPSY Left 02/02/2013   Procedure: NEEDLE LOCALIZED AXILLARY LYMPH NODE BIOPSY;  Surgeon: Haywood Lasso, MD;  Location: [place];  Service: General;  Laterality: Left;  . BONE MARROW BIOPSY  08/2012  . CYST REMOVAL NECK Left 1980  . LEFT INGUINAL LYMPH NODE BX  09-09-2012  . LYMPH NODE BIOPSY N/A 07/28/2015   Procedure: LYMPH NODE BIOPSY;  Surgeon: Melrose Nakayama, MD;  Location: [place];  Service: Thoracic;  Laterality: N/A;  . NODE DISSECTION Right 07/28/2015   Procedure: NODE DISSECTION;  Surgeon: Melrose Nakayama, MD;  Location: [place];  Service: Thoracic;   Laterality: Right;  . PLEURA BIOPSY Left 05/31/2014  . REMOVAL RIGHT INGUINAL LYMPH NODES  08-16-2011  . SCROTAL EXPLORATION Right 01/04/2014   Procedure: SCROTUM EXPLORATION   INGUINAL , EXCISION OF CYSTIC MASS OF RIGHT SPERMATIC CORD, WITH FROZEN SECTION;  Surgeon: Franchot Gallo, MD;  Location: [facility];  Service: Urology;  Laterality: Right;  . TRANSTHORACIC ECHOCARDIOGRAM  03-18-2013   MILD LVH/  EF 55-60%  . VIDEO ASSISTED THORACOSCOPY Left 05/31/2014   Procedure: LEFT VIDEO ASSISTED THORACOSCOPY, PLEURAL BIOPSY;  Surgeon: Melrose Nakayama, MD;  Location: [place];  Service: Thoracic;  Laterality: Left;  Marland Kitchen VIDEO ASSISTED THORACOSCOPY Right 07/28/2015   Procedure: RIGHT VIDEO ASSISTED THORACOSCOPY;  Surgeon: Melrose Nakayama, MD;  Location: [place];  Service: Thoracic;  Laterality: Right;  Marland Kitchen VIDEO ASSISTED THORACOSCOPY (VATS)/ LYMPH NODE SAMPLING Right 07/28/2015  . VIDEO ASSISTED THORACOSCOPY (VATS)/WEDGE RESECTION  05/31/2014  . VIDEO BRONCHOSCOPY WITH ENDOBRONCHIAL ULTRASOUND  07/28/2015  . VIDEO BRONCHOSCOPY WITH ENDOBRONCHIAL ULTRASOUND N/A 07/28/2015   Procedure: VIDEO BRONCHOSCOPY WITH ENDOBRONCHIAL ULTRASOUND;  Surgeon: Melrose Nakayama, MD;  Location: [place];  Service: Thoracic;  Laterality: N/A;    SOCIAL HISTORY:   Social History   Social History  . Marital status: Legally Separated    Spouse name: N/A  . Number of children: N/A  . Years of education: N/A   Occupational History  . Arboriculturist Communication   Social History Main Topics  . Smoking status: Never Smoker  . Smokeless tobacco: Never Used  . Alcohol use 8.4 oz/week    7 Cans of beer, 7 Glasses of wine per week  . Drug use: No  . Sexual activity: Not Currently   Other Topics Concern  . Not on file   Social History Narrative   Married. Education: [employer]. Exercise: jog/walk/ lift weights 7 days a week, 1-2 miles.    FAMILY HISTORY:   Family Status  Relation Status  . Mother Deceased    . Father Deceased  . Sister Alive  . Brother Alive  . Son Alive    ROS:  Feet and toe paresthesias with neuropathy.  A complete 10 system review of systems was obtained and was unremarkable apart from what is mentioned above.  PHYSICAL EXAMINATION:    VITALS:   Vitals:   11/27/16 0909  BP: 110/80  Pulse: 86  Weight: 175 lb (79.4 kg)  Height: 5' 10"  (1.778 m)     Wt Readings from Last 3 Encounters:  11/27/16 175 lb (79.4 kg)  08/22/16 169 lb 4 oz (76.8 kg)  07/20/16 167 lb (75.8 kg)   Temp Readings from Last 3 Encounters:  07/19/16 98.2 F (36.8 C) (Oral)  06/27/16 98.2 F (36.8 C) (Oral)  05/18/16 98.2 F (36.8 C) (Oral)   BP Readings from Last 3 Encounters:  11/27/16 110/80  08/22/16 140/88  07/20/16 120/62   Pulse Readings from Last 3 Encounters:  11/27/16 86  08/22/16 (!) 45  07/20/16 82     GEN:  The patient appears stated age and is in NAD.  Laughs today HEENT:  Normocephalic, atraumatic.  The mucous membranes are moist. The superficial temporal arteries are without ropiness or tenderness. CV:    Regular rate and rhythm. Lungs:  CTAB Neck/HEME:  There are no carotid bruits bilaterally.  Neurological examination:  Orientation: The patient is alert and oriented x3. Fund of knowledge is appropriate.  Cranial nerves: There is slight right facial droop, only noticeable because of decreased nasolabial fold on the right.  He is able to symmetrically activate the muscles of facial expression.  He does have abnormal synkinetic reinnervation of the muscles of facial expression on the right.  He does not have a Bell's phenomenon.  There is facial hypomimia.    The speech is fluent and clear.  He is hypophonic.  Soft palate rises symmetrically and there is no tongue deviation. Hearing is intact to conversational tone. Sensation: Sensation is intact to light throughout.   Movement examination: Tone: He has mild to moderate rigidity in the UE bilaterally L more than  R   Left upper extremity tone was good. Abnormal movements: None Coordination:  There is improved RAM's but still slow with decremation on the L. Gait and Station: The  patient has no difficulty arising out of a deep-seated chair without the use of the hands. The patient's stride length is normal, although he does drag the feet a little.  He is able to jog down the hall without difficulty and looks normal when doing so.  The patient has a negative pull test.    He sways in the Romberg position with eyes closed.  ASSESSMENT/PLAN:  1.  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  -The patient had a DaT scan on 08/16/2016 that showed abnormal grade 3 uptake, virtually absent bilaterally in both putamen and caudate nuclei.   -We discussed the diagnosis as well as pathophysiology of the disease.  We discussed treatment options as well as prognostic indicators.  Patient education was provided.  -We decided to slowly increase pramipexole to 1 mg tid.  We discussed risks, benefits, and side effects.  We discussed risk of compulsive behavior and sleep attacks.  Understanding was expressed.  -continue biking for exercise  2.  Chemo (adcetris) induced peripheral neuropathy  -The patient has only mild clinical examination evidence of a diffuse peripheral neuropathy, and I do not think this can explain all of his symptoms.  I think it does explain his paresthesias that he has had since chemotherapy, but I am not sure it can explain his gait changes, and definitely cannot explain his hyperreflexia, which would be unusual in a neuropathy patient.  He may have always been hyperreflexic.  He refuses MRI cervical spine due to high deductible plan.   3.  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  4.  Hodgkins lymphoma, s/p autologous transplant with recurrence post transplant  5.  2016  transient (1-2 min) of word/speech trouble  -reviewed hospital records and agree that this likely did not represent TIA and if anything, more consistent with seizure.  In any case, the event has not recurred.  6.  Depression, moderate, recurrent  -better after tx but still some anxiety.  Refuses psychiatry  7. Follow up is anticipated in the next few months, sooner should new neurologic issues arise.

## 2016-11-16 ENCOUNTER — Ambulatory Visit: Payer: Commercial Managed Care - PPO | Admitting: Physical Therapy

## 2016-11-16 DIAGNOSIS — R293 Abnormal posture: Secondary | ICD-10-CM

## 2016-11-16 DIAGNOSIS — R2689 Other abnormalities of gait and mobility: Secondary | ICD-10-CM

## 2016-11-16 DIAGNOSIS — R2681 Unsteadiness on feet: Secondary | ICD-10-CM

## 2016-11-16 NOTE — Therapy (Addendum)
[facility] [address] [place], [place], [address] Phone: [phone]   Fax:  [phone]  Physical Therapy Treatment  Patient Details  Name: Brian [name] MRN: [id number] Date of Birth: [birth year] Referring Provider: Alonza Bogus, DO  Encounter Date: 11/16/2016      PT End of Session - 11/16/16 1139    Visit Number 2   Number of Visits 9   Date for PT Re-Evaluation 12/22/16   Authorization Type UMR   PT Start Time 1057   PT Stop Time 1137   PT Time Calculation (min) 40 min   Activity Tolerance Patient tolerated treatment well   Behavior During Therapy [facility] for tasks assessed/performed      Past Medical History:  Diagnosis Date  . Anxiety   . Dysrhythmia    past hx pvc  . H/O autologous stem cell transplant ([place])    may 2014  at [facility]  . History of Bell's palsy    2009  RIGHT SIDE--  HAS 80% FUNCTION / PT STATES A LITTLE ASYMETRICAL AND EFFECTS MOUTH  . Hodgkin's disease, nodular sclerosis, of inguinal region/lower limb ([place]) ONOLOGIST--  DR ENNEVER AND A [facility]     SALVAGE CHEMO 2013/  AUTOLOGUS STEM CELL TRANSPLANT MAY 2014 AT [facility]  . Mass of right inguinal region   . PVC (premature ventricular contraction)    "benign"  . TIA (transient ischemic attack) 02/2015   "probable TIA"  . Wears contact lenses     Past Surgical History:  Procedure Laterality Date  . AXILLARY LYMPH NODE BIOPSY Left 02/02/2013   Procedure: NEEDLE LOCALIZED AXILLARY LYMPH NODE BIOPSY;  Surgeon: Haywood Lasso, MD;  Location: [place];  Service: General;  Laterality: Left;  . BONE MARROW BIOPSY  08/2012  . CYST REMOVAL NECK Left 1980  . LEFT INGUINAL LYMPH NODE BX  09-09-2012  . LYMPH NODE BIOPSY N/A 07/28/2015   Procedure: LYMPH NODE BIOPSY;  Surgeon: Melrose Nakayama, MD;  Location: [place];  Service: Thoracic;  Laterality: N/A;  . NODE DISSECTION Right 07/28/2015   Procedure: NODE DISSECTION;  Surgeon: Melrose Nakayama, MD;  Location: [place];  Service: Thoracic;  Laterality: Right;  . PLEURA BIOPSY Left 05/31/2014  . REMOVAL RIGHT INGUINAL LYMPH NODES  08-16-2011  . SCROTAL EXPLORATION Right 01/04/2014   Procedure: SCROTUM EXPLORATION   INGUINAL , EXCISION OF CYSTIC MASS OF RIGHT SPERMATIC CORD, WITH FROZEN SECTION;  Surgeon: Franchot Gallo, MD;  Location: [facility];  Service: Urology;  Laterality: Right;  . TRANSTHORACIC ECHOCARDIOGRAM  03-18-2013   MILD LVH/  EF 55-60%  . VIDEO ASSISTED THORACOSCOPY Left 05/31/2014   Procedure: LEFT VIDEO ASSISTED THORACOSCOPY, PLEURAL BIOPSY;  Surgeon: Melrose Nakayama, MD;  Location: [place];  Service: Thoracic;  Laterality: Left;  Marland Kitchen VIDEO ASSISTED THORACOSCOPY Right 07/28/2015   Procedure: RIGHT VIDEO ASSISTED THORACOSCOPY;  Surgeon: Melrose Nakayama, MD;  Location: [place];  Service: Thoracic;  Laterality: Right;  Marland Kitchen VIDEO ASSISTED THORACOSCOPY (VATS)/ LYMPH NODE SAMPLING Right 07/28/2015  . VIDEO ASSISTED THORACOSCOPY (VATS)/WEDGE RESECTION  05/31/2014  . VIDEO BRONCHOSCOPY WITH ENDOBRONCHIAL ULTRASOUND  07/28/2015  . VIDEO BRONCHOSCOPY WITH ENDOBRONCHIAL ULTRASOUND N/A 07/28/2015   Procedure: VIDEO BRONCHOSCOPY WITH ENDOBRONCHIAL ULTRASOUND;  Surgeon: Melrose Nakayama, MD;  Location: [place];  Service: Thoracic;  Laterality: N/A;    There were no vitals filed for this visit.      Subjective Assessment - 11/16/16 1058  Subjective seems frustrated with current situation; "some days are better than others."   Pertinent History Lymphoma 3x in 4 years   Patient Stated Goals Pt's goal for therapy is to improve general mobility and everyday getting around.   Currently in Pain? No/denies                         [facility] Adult PT Treatment/Exercise - 11/16/16 0001      Ambulation/Gait   Ambulation/Gait Yes   Ambulation/Gait Assistance 5: Supervision;6: Modified independent (Device/Increase time)   Ambulation/Gait Assistance  Details cues for posture, arm swing and heel strike   Ambulation Distance (Feet) 1000 Feet   Assistive device None  foot up ASO   Gait Pattern Step-through pattern;Decreased arm swing - right;Decreased arm swing - left;Decreased step length - right;Decreased step length - left;Decreased stance time - left;Decreased dorsiflexion - right;Decreased dorsiflexion - left;Shuffle;Decreased trunk rotation;Trunk rotated posteriorly on left;Narrow base of support;Poor foot clearance - left;Poor foot clearance - right   Ambulation Surface Level;Indoor   Gait Comments trialed foot up AFO but no significant change in gait      Self-Care   Self-Care Other Self-Care Comments   Other Self-Care Comments  educated on benefits of PT but pt at this time requesting to hold stating "I can do this at home"           [facility]) - 11/16/16 1113    PWR! exercises Moves in sitting;Moves in standing   PWR! Up x20   PWR! Rock x10 bil   PWR! Twist x10 bil   PWR Step x10 bil   Comments min cues for weight shifting and big movements; standing   PWR! Up x20   PWR! Rock x10 bil   PWR! Twist x10 bil   PWR! Step x10 bil   Comments min cues for technique; sitting             PT Education - 11/16/16 1138    Education provided Yes   Education Details see self care; discussed community resources including cycle class at [employer] and rock steady boxing (pt interested in both)   Person(s) Educated Patient   Methods Explanation   Comprehension Verbalized understanding             PT Long Term Goals - 10/23/16 1344      PT LONG TERM GOAL #1   Title Pt will be independent with Parkinson's specific HEP for improved posture, balance, and gait.  TARGET 11/22/16   Time 4   Period Weeks   Status New     PT LONG TERM GOAL #2   Title Pt will perform at least 8 of 10 reps of sit<>stand transfers from surfaces, 18 inches or lower, with initiation of gait with no loss of balance, for improved transfer efficiency  and safety.   Time 4   Period Weeks   Status New     PT LONG TERM GOAL #3   Title Pt will improve Functional Gait Assessment to at least 22/30 for decreased fall risk.   Time 4   Period Weeks   Status New     PT LONG TERM GOAL #4   Title Pt will verbalize understanding of local Parkinson's disease resources, including support group and exercise options.   Time 4   Period Weeks   Status New     PT LONG TERM GOAL #5   Title Pt will verbalize understanding of fall prevention in the  home environment.   Time 4   Period Weeks   Status New               Plan - 11/16/16 1139    Clinical Impression Statement Pt conitnues to need min cues for HEP and trialed foot up AFO with min improvement.  Pt requesting to hold at this time as he feels he can continue with current home program and reports busy schedule with limited time to come to PT.  Will leave chart open x 30 days in case pt opts to return.   PT Treatment/Interventions ADLs/Self Care Home Management;Functional mobility training;Gait training;Therapeutic activities;Therapeutic exercise;Balance training;Neuromuscular re-education;Patient/family education;Orthotic Fit/Training   PT Next Visit Plan Review PWR! Moves in sitting and standing with emphasis on large amplitude, deliberate, intensity of movements, gait with intensity   Consulted and Agree with Plan of Care Patient      Patient will benefit from skilled therapeutic intervention in order to improve the following deficits and impairments:  Abnormal gait, Decreased balance, Decreased mobility, Decreased strength, Difficulty walking, Postural dysfunction, Impaired tone  Visit Diagnosis: Abnormal posture  Other abnormalities of gait and mobility  Unsteadiness on feet     Problem List Patient Active Problem List   Diagnosis Date Noted  . Lymphadenopathy 07/28/2015  . Mediastinal adenopathy 07/14/2015  . Aphasia 03/04/2015  . Slurred speech 03/04/2015  . Pleural  mass 05/31/2014  . Abdominal pain, epigastric 02/02/2014  . Weight loss 08/12/2012  . H/O Bell's palsy 08/12/2012  . Hodgkin's disease 08/09/2011       Laureen Abrahams, PT, DPT 11/16/16 11:41 AM    [facility] [address] [place], [place], [address] Phone: [phone]   Fax:  [phone]  Name: CRISTOPHER CICCARELLI MRN: [id number] Date of Birth: 05-14-1961      PHYSICAL THERAPY DISCHARGE SUMMARY  Visits from Start of Care: 3  Current functional level related to goals / functional outcomes: See above   Remaining deficits: unknown   Education / Equipment: HEP  Plan: Patient agrees to discharge.  Patient goals were not met. Patient is being discharged due to not returning since the last visit.  ?????    Laureen Abrahams, PT, DPT 05/16/17 8:22 AM  [facility] [address] [place], [place] [address]  [phone] (office) [phone] (fax)

## 2016-11-23 ENCOUNTER — Ambulatory Visit (HOSPITAL_COMMUNITY): Payer: Commercial Managed Care - PPO

## 2016-11-27 ENCOUNTER — Ambulatory Visit (INDEPENDENT_AMBULATORY_CARE_PROVIDER_SITE_OTHER): Payer: Commercial Managed Care - PPO | Admitting: Neurology

## 2016-11-27 ENCOUNTER — Encounter: Payer: Self-pay | Admitting: Neurology

## 2016-11-27 VITALS — BP 110/80 | HR 86 | Ht 70.0 in | Wt 175.0 lb

## 2016-11-27 DIAGNOSIS — G20 Parkinson's disease: Secondary | ICD-10-CM | POA: Diagnosis not present

## 2016-11-27 DIAGNOSIS — F411 Generalized anxiety disorder: Secondary | ICD-10-CM

## 2016-11-27 MED ORDER — PRAMIPEXOLE DIHYDROCHLORIDE 1 MG PO TABS: 1.0000 mg | ORAL_TABLET | Freq: Three times a day (TID) | ORAL | 1 refills | Status: DC

## 2016-11-27 NOTE — Patient Instructions (Signed)
Increase pramipexole as follows:  -week 1:  0.5 mg in AM and noon and 1.0 mg at night  -week 2:  0.5 mg in AM, 1.0 mg at noon and night  -week 3 and thereafter:  1.0 mg three times per day  Good to see you and happy new year!

## 2016-11-30 ENCOUNTER — Encounter (HOSPITAL_COMMUNITY): Payer: Self-pay | Admitting: Radiology

## 2016-11-30 ENCOUNTER — Encounter (HOSPITAL_COMMUNITY)
Admission: RE | Admit: 2016-11-30 | Discharge: 2016-11-30 | Disposition: A | Discharge status: Home / Self Care | Payer: Commercial Managed Care - PPO | Source: Ambulatory Visit | Attending: Hematology & Oncology | Admitting: Hematology & Oncology

## 2016-11-30 DIAGNOSIS — C8590 Non-Hodgkin lymphoma, unspecified, unspecified site: Secondary | ICD-10-CM | POA: Diagnosis not present

## 2016-11-30 DIAGNOSIS — C8195 Hodgkin lymphoma, unspecified, lymph nodes of inguinal region and lower limb: Secondary | ICD-10-CM

## 2016-11-30 LAB — GLUCOSE, CAPILLARY: Glucose-Capillary: 99 mg/dL (ref 65–99)

## 2016-11-30 MED ORDER — FLUDEOXYGLUCOSE F - 18 (FDG) INJECTION
8.6700 | Freq: Once | INTRAVENOUS | Status: AC | PRN
  Administered 2016-11-30: 8.67 via INTRAVENOUS

## 2016-12-04 DIAGNOSIS — C8190 Hodgkin lymphoma, unspecified, unspecified site: Secondary | ICD-10-CM | POA: Diagnosis not present

## 2016-12-05 DIAGNOSIS — C8190 Hodgkin lymphoma, unspecified, unspecified site: Secondary | ICD-10-CM | POA: Diagnosis not present

## 2016-12-05 DIAGNOSIS — Z006 Encounter for examination for normal comparison and control in clinical research program: Secondary | ICD-10-CM | POA: Diagnosis not present

## 2016-12-05 DIAGNOSIS — G20 Parkinson's disease: Secondary | ICD-10-CM | POA: Diagnosis not present

## 2016-12-05 DIAGNOSIS — C8100 Nodular lymphocyte predominant Hodgkin lymphoma, unspecified site: Secondary | ICD-10-CM | POA: Diagnosis not present

## 2017-02-27 NOTE — Progress Notes (Signed)
Brian [name] was seen today in the movement disorders clinic for neurologic consultation at the request of No PCP Per Patient.  The consultation is for the evaluation of parkinsonism.  I have reviewed numerous records made available to me.  The patient previously saw Dr. [name] and subsequently Dr. [name].  He saw Dr. [name] when he was in the hospital in April, 2016.  That was for an event in which he experienced 1-2 minutes of speech and word finding trouble.  Dr. [name] doubted that was a TIA.  He had an EEG that was negative.  He had an MRI of the brain on 03/04/2015 without gadolinium that I had the opportunity to review.  There were a very few scattered T2 hyperintensities, but diffusion-weighted imaging was negative.  MRSA was negative.  He followed up with Dr. [name] one time on an outpatient basis, but nothing further was revealed.  Today, the patient presents because of shuffling of the feet for about 8 months.  Pt states that it started dragging of the L foot but now seems to be dragging both.  He has also noted handwriting and voice changes and has become concerned about the possibility of Parkinson's disease.  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  He also has a history of chemotherapy-induced peripheral neuropathy (Adcetris).  He asks about "psychogenic stuff."  States that he has been through divorce along with his cancer.  Asks if his sx's could be from stress as he has been reading about psychogenic manifestations of sx's.  08/22/16 update: The patient follows up today for testing results.  This patient is accompanied in the office by his ex wife who supplements the history.  He had a DaT scan done on 08/16/16 and showed abnormal grade 3 uptake, virtually absent bilaterally in both putamen and caudate nuclei.    He has not had any falls since last visit.  Continues to have difficulty with picking up the feet.  He denies lightheadedness or near syncope.  He denies hallucinations.  He continues to have issues with depression.  No SI/HI.  Ex wife concerned about mood.  Also noting stiffness, some drooling, constipation.    11/27/16 update: The patient follows up today, accompanied by his ex-wife (and friend) who supplements the history.  The patient was started on pramipexole last visit and worked up to 0.5 mg 3 times per day.  He denies any compulsive behaviors or sleep attacks.  He states that he is doing better but not as good as he thought that he would.  He is shuffling some.  He is exercising with his bike.  He has been only to 2 physical therapy sessions.  He declined speech and occupational therapy, despite the fact that the therapist recommended them as did I.  He has not had any falls.  Denies lightheadedness or syncope.  He continues to struggle with depression but states that mood has been better.  Last visit, he was given names of psychiatrist but refused to go, stating that he did not need a psychiatrist and had a Social worker.  He called me for a prescription for Xanax, but this is not a medication that I generally recommended my Parkinson's patients.  03/05/17 update:  Patient follows up today.  He is unaccompanied today.   The  patient is on pramipexole but we increased that last visit to 1.0 mg tid.  No falls but some near falls.  He is doing rock steady one day a week.  There've been no compulsive behaviors.  No sleep attacks.  No hallucinations.  No lightheadedness or near syncope.  He was seen last by the bone marrow transplant team at [facility] in January, 2018.  I have reviewed those records.  He is currently disease-free from a lymphoma standpoint.   ALLERGIES:  No Known Allergies  CURRENT MEDICATIONS:  Outpatient Encounter Prescriptions as of 03/05/2017  Medication Sig  . acetaminophen (TYLENOL) 325 MG  tablet Take 325 mg by mouth every 6 (six) hours as needed for mild pain.  Marland Kitchen aspirin 81 MG chewable tablet Chew 1 tablet (81 mg total) by mouth daily.  . Cholecalciferol (VITAMIN D-3) 1000 UNITS CAPS Take 1 capsule by mouth daily.   . Multiple Vitamin (MULTIVITAMIN) tablet Take 1 tablet by mouth daily.  . pramipexole (MIRAPEX) 1 MG tablet Take 1 tablet (1 mg total) by mouth 3 (three) times daily.   No facility-administered encounter medications on file as of 03/05/2017.     PAST MEDICAL HISTORY:   Past Medical History:  Diagnosis Date  . Anxiety   . Dysrhythmia    past hx pvc  . H/O autologous stem cell transplant ([place])    may 2014  at [facility]  . History of Bell's palsy    2009  RIGHT SIDE--  HAS 80% FUNCTION / PT STATES A LITTLE ASYMETRICAL AND EFFECTS MOUTH  . Hodgkin's disease, nodular sclerosis, of inguinal region/lower limb ([place]) ONOLOGIST--  DR [facility]NEVER AND A [facility]     [place] CHEMO 2013/  AUTOLOGUS STEM CELL TRANSPLANT MAY 2014 AT [facility]  . Mass of right inguinal region   . PVC (premature ventricular contraction)    "benign"  . TIA (transient ischemic attack) 02/2015   "probable TIA"  . Wears contact lenses     PAST SURGICAL HISTORY:   Past Surgical History:  Procedure Laterality Date  . AXILLARY LYMPH NODE BIOPSY Left 02/02/2013   Procedure: NEEDLE LOCALIZED AXILLARY LYMPH NODE BIOPSY;  Surgeon: Haywood Lasso, MD;  Location: [place];  Service: General;  Laterality: Left;  . BONE MARROW BIOPSY  08/2012  . CYST REMOVAL NECK Left 1980  . LEFT INGUINAL LYMPH NODE BX  09-09-2012  . LYMPH NODE BIOPSY N/A 07/28/2015   Procedure: LYMPH NODE BIOPSY;  Surgeon: Melrose Nakayama, MD;  Location: [place];  Service: Thoracic;  Laterality: N/A;  . NODE DISSECTION Right 07/28/2015   Procedure: NODE DISSECTION;  Surgeon: Melrose Nakayama, MD;  Location: [place];  Service: Thoracic;  Laterality: Right;  . PLEURA BIOPSY Left 05/31/2014  . REMOVAL RIGHT INGUINAL LYMPH NODES   08-16-2011  . SCROTAL EXPLORATION Right 01/04/2014   Procedure: SCROTUM EXPLORATION   INGUINAL , EXCISION OF CYSTIC MASS OF RIGHT SPERMATIC CORD, WITH FROZEN SECTION;  Surgeon: Franchot Gallo, MD;  Location[facility];  Service: Urology;  Laterality: Right;  . TRANSTHORACIC ECHOCARDIOGRAM  03-18-2013   MILD LVH/  EF 55-60%  . VIDEO ASSISTED THORACOSCOPY Left 05/31/2014   Procedure: LEFT VIDEO ASSISTED THORACOSCOPY, PLEURAL BIOPSY;  Surgeon: Melrose Nakayama, MD;  Location: [place];  Service: Thoracic;  Laterality: Left;  Marland Kitchen VIDEO ASSISTED THORACOSCOPY Right 07/28/2015   Procedure: RIGHT VIDEO ASSISTED THORACOSCOPY;  Surgeon: Melrose Nakayama, MD;  Location: [place];  Service: Thoracic;  Laterality: Right;  .  VIDEO ASSISTED THORACOSCOPY (VATS)/ LYMPH NODE SAMPLING Right 07/28/2015  . VIDEO ASSISTED THORACOSCOPY (VATS)/WEDGE RESECTION  05/31/2014  . VIDEO BRONCHOSCOPY WITH ENDOBRONCHIAL ULTRASOUND  07/28/2015  . VIDEO BRONCHOSCOPY WITH ENDOBRONCHIAL ULTRASOUND N/A 07/28/2015   Procedure: VIDEO BRONCHOSCOPY WITH ENDOBRONCHIAL ULTRASOUND;  Surgeon: Melrose Nakayama, MD;  Location: [place];  Service: Thoracic;  Laterality: N/A;    SOCIAL HISTORY:   Social History   Social History  . Marital status: Legally Separated    Spouse name: N/A  . Number of children: N/A  . Years of education: N/A   Occupational History  . Arboriculturist Communication   Social History Main Topics  . Smoking status: Never Smoker  . Smokeless tobacco: Never Used  . Alcohol use 8.4 oz/week    7 Cans of beer, 7 Glasses of wine per week  . Drug use: No  . Sexual activity: Not Currently   Other Topics Concern  . Not on file   Social History Narrative   Married. Education: [employer]. Exercise: jog/walk/ lift weights 7 days a week, 1-2 miles.    FAMILY HISTORY:   Family Status  Relation Status  . Mother Deceased  . Father Deceased  . Sister Alive  . Brother Alive  . Son Alive    ROS:  Feet and  toe paresthesias with neuropathy.  A complete 10 system review of systems was obtained and was unremarkable apart from what is mentioned above.  PHYSICAL EXAMINATION:    VITALS:   Vitals:   03/05/17 1258  BP: 120/78  Pulse: 84  SpO2: 98%  Weight: 175 lb (79.4 kg)  Height: _0  (1.778 m)     Wt Readings from Last 3 Encounters:  03/05/17 175 lb (79.4 kg)  11/27/16 175 lb (79.4 kg)  08/22/16 169 lb 4 oz (76.8 kg)   Temp Readings from Last 3 Encounters:  07/19/16 98.2 F (36.8 C) (Oral)  06/27/16 98.2 F (36.8 C) (Oral)  05/18/16 98.2 F (36.8 C) (Oral)   BP Readings from Last 3 Encounters:  03/05/17 120/78  11/27/16 110/80  08/22/16 140/88   Pulse Readings from Last 3 Encounters:  03/05/17 84  11/27/16 86  08/22/16 (!) 45     GEN:  The patient appears stated age and is in NAD.  Laughs today HEENT:  Normocephalic, atraumatic.  The mucous membranes are moist. The superficial temporal arteries are without ropiness or tenderness. CV:    Regular rate and rhythm. Lungs:  CTAB Neck/HEME:  There are no carotid bruits bilaterally.  Neurological examination:  Orientation: The patient is alert and oriented x3. Fund of knowledge is appropriate.  Cranial nerves: There is slight right facial droop, only noticeable because of decreased nasolabial fold on the right.  He is able to symmetrically activate the muscles of facial expression.  He does have abnormal synkinetic reinnervation of the muscles of facial expression on the right.  He does not have a Bell's phenomenon.  There is facial hypomimia.    The speech is fluent and clear.  He is hypophonic.  Soft palate rises symmetrically and there is no tongue deviation. Hearing is intact to conversational tone. Sensation: Sensation is intact to light throughout.   Movement examination: Tone: He has mild rigidity in the UE bilaterally.  Abnormal movements: None Coordination:  There is improved RAM's but still slow with decremation  on the L. Gait and Station: The patient has no difficulty arising out of a deep-seated chair without the use of the hands. The patient's  stride length is normal, although he does drag the feet a little.  He walks on his tip toes and does festinate.  The patient has a negative pull test.    He sways in the Romberg position with eyes closed.  ASSESSMENT/PLAN:  1.  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  -The patient had a DaT scan on 08/16/2016 that showed abnormal grade 3 uptake, virtually absent bilaterally in both putamen and caudate nuclei.   -We discussed the diagnosis as well as pathophysiology of the disease.  We discussed treatment options as well as prognostic indicators.  Patient education was provided.  -We decided to continue pramipexole 1 mg tid.  We discussed risks, benefits, and side effects.  We discussed risk of compulsive behavior and sleep attacks.  Understanding was expressed.  -continue biking for exercise.  He is doing rock steady boxing  -encouraged ACT as I think that he needs one on one training to help with gait.  2.  Chemo (adcetris) induced peripheral neuropathy  -The patient has only mild clinical examination evidence of a diffuse peripheral neuropathy, and I do not think this can explain all of his symptoms.  I think it does explain his paresthesias that he has had since chemotherapy, but I am not sure it can explain his gait changes, and definitely cannot explain his hyperreflexia, which would be unusual in a neuropathy patient.  He may have always been hyperreflexic.  He refuses MRI cervical spine due to high deductible plan.   3.  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  4.  Hodgkins lymphoma, s/p autologous transplant with recurrence post transplant  5.  2016 transient (1-2 min) of word/speech trouble  -reviewed hospital records and  agree that this likely did not represent TIA and if anything, more consistent with seizure.  In any case, the event has not recurred.  6.  Depression, moderate, recurrent  -better after tx but still some anxiety.  Refuses psychiatry.  Much better than in the past as adjusts to dx of PD.  7. Follow up is anticipated in the next few months, sooner should new neurologic issues arise.  Much greater than 50% of this visit was spent in counseling and coordinating care.  Total face to face time:  30 min

## 2017-03-05 ENCOUNTER — Ambulatory Visit (INDEPENDENT_AMBULATORY_CARE_PROVIDER_SITE_OTHER): Payer: Commercial Managed Care - PPO | Admitting: Neurology

## 2017-03-05 ENCOUNTER — Encounter: Payer: Self-pay | Admitting: Neurology

## 2017-03-05 VITALS — BP 120/78 | HR 84 | Ht 70.0 in | Wt 175.0 lb

## 2017-03-05 DIAGNOSIS — G20 Parkinson's disease: Secondary | ICD-10-CM

## 2017-03-07 DIAGNOSIS — R509 Fever, unspecified: Secondary | ICD-10-CM | POA: Diagnosis not present

## 2017-03-07 DIAGNOSIS — C8100 Nodular lymphocyte predominant Hodgkin lymphoma, unspecified site: Secondary | ICD-10-CM | POA: Diagnosis not present

## 2017-03-07 DIAGNOSIS — G20 Parkinson's disease: Secondary | ICD-10-CM | POA: Diagnosis not present

## 2017-03-07 DIAGNOSIS — C8190 Hodgkin lymphoma, unspecified, unspecified site: Secondary | ICD-10-CM | POA: Diagnosis not present

## 2017-03-07 DIAGNOSIS — R197 Diarrhea, unspecified: Secondary | ICD-10-CM | POA: Diagnosis not present

## 2017-03-18 ENCOUNTER — Other Ambulatory Visit: Payer: Self-pay | Admitting: Hematology & Oncology

## 2017-03-18 DIAGNOSIS — C8195 Hodgkin lymphoma, unspecified, lymph nodes of inguinal region and lower limb: Secondary | ICD-10-CM

## 2017-04-02 LAB — BASIC METABOLIC PANEL
BUN: 21 (ref 4–21)
Creatinine: 1.1 (ref 0.6–1.3)
GLUCOSE: 105
Potassium: 5.1 (ref 3.4–5.3)
Sodium: 142 (ref 137–147)

## 2017-04-02 LAB — LIPID PANEL
Cholesterol: 180 (ref 0–200)
HDL: 46 (ref 35–70)
LDL CALC: 120
TRIGLYCERIDES: 68 (ref 40–160)

## 2017-04-02 LAB — HEMOGLOBIN A1C: Hemoglobin A1C: 5.9

## 2017-04-02 LAB — HEPATIC FUNCTION PANEL
ALK PHOS: 79 (ref 25–125)
ALT: 16 (ref 10–40)
AST: 20 (ref 14–40)
BILIRUBIN, TOTAL: 0.4

## 2017-04-05 ENCOUNTER — Encounter (HOSPITAL_COMMUNITY)
Admission: RE | Admit: 2017-04-05 | Discharge: 2017-04-05 | Disposition: A | Discharge status: Home / Self Care | Payer: Commercial Managed Care - PPO | Source: Ambulatory Visit | Attending: Hematology & Oncology | Admitting: Hematology & Oncology

## 2017-04-05 DIAGNOSIS — C8190 Hodgkin lymphoma, unspecified, unspecified site: Secondary | ICD-10-CM | POA: Diagnosis not present

## 2017-04-05 DIAGNOSIS — C8195 Hodgkin lymphoma, unspecified, lymph nodes of inguinal region and lower limb: Secondary | ICD-10-CM

## 2017-04-05 LAB — GLUCOSE, CAPILLARY: GLUCOSE-CAPILLARY: 105 mg/dL — AB (ref 65–99)

## 2017-04-05 MED ORDER — FLUDEOXYGLUCOSE F - 18 (FDG) INJECTION
8.7000 | Freq: Once | INTRAVENOUS | Status: AC | PRN
  Administered 2017-04-05: 8.7 via INTRAVENOUS

## 2017-04-08 ENCOUNTER — Other Ambulatory Visit: Payer: Self-pay | Admitting: Hematology & Oncology

## 2017-04-09 ENCOUNTER — Ambulatory Visit (HOSPITAL_BASED_OUTPATIENT_CLINIC_OR_DEPARTMENT_OTHER): Payer: Commercial Managed Care - PPO | Admitting: Hematology & Oncology

## 2017-04-09 VITALS — BP 139/96 | HR 79 | Temp 98.3°F | Resp 19 | Wt 175.0 lb

## 2017-04-09 DIAGNOSIS — Z8571 Personal history of Hodgkin lymphoma: Secondary | ICD-10-CM

## 2017-04-09 DIAGNOSIS — C8190 Hodgkin lymphoma, unspecified, unspecified site: Secondary | ICD-10-CM | POA: Diagnosis not present

## 2017-04-09 DIAGNOSIS — C8195 Hodgkin lymphoma, unspecified, lymph nodes of inguinal region and lower limb: Secondary | ICD-10-CM

## 2017-04-09 NOTE — Progress Notes (Signed)
Hematology and Oncology Follow Up Visit  Brian [name] [id number] 08-Jun-1961 56 y.o. 04/09/2017   Principle Diagnosis:  Recurrent Hodgkin's Disease -  S/p autologous transplant  TIA-resolved  Current Therapy:   S/p CAR-T cell therapy - [facility] on 03/06/2016     Interim History:  [name] is in for an unscheduled visit. He had a routine PET scan done a couple days ago. This was part of the protocol for his CAR-T cell therapy at [facility]. His previous PET scan was done in January.  He has had no symptoms. He does have the Parkinson's which is probably from his therapies that he has had. He is on Mirapex for this.  He is still working.  He is still having a very difficult time emotionally over his wife leaving him.   Unfortunately, there are some changes on his PET scan. There are for lymph nodes in the chest that are subcentimeter with SUV less than for that were not present on his prior scan. The radiologist was "suspicious" for this representing recurrent disease.  I reviewed the PET scan at length with him.  He is not having cough. He is not having shortness of breath. Is not having chest wall pain. Going up and that he is having is in the left upper quadrant of the abdomen which she's had for quite a while. There is nothing in that area that is active on PET scan.   He is eating okay. He is exercising. Again he has this Parkinson's type neurological condition. This appears to be stable.   Overall, his performance status is ECOG 1.   Medications:  Current Outpatient Prescriptions:  .  acetaminophen (TYLENOL) 325 MG tablet, Take 325 mg by mouth every 6 (six) hours as needed for mild pain., Disp: , Rfl:  .  aspirin 81 MG chewable tablet, Chew 1 tablet (81 mg total) by mouth daily., Disp: 30 tablet, Rfl: 0 .  Cholecalciferol (VITAMIN D-3) 1000 UNITS CAPS, Take 1 capsule by mouth daily. , Disp: , Rfl:  .  Multiple Vitamin (MULTIVITAMIN) tablet, Take 1 tablet by mouth daily., Disp: ,  Rfl:  .  pramipexole (MIRAPEX) 1 MG tablet, Take 1 tablet (1 mg total) by mouth 3 (three) times daily., Disp: 270 tablet, Rfl: 1  Allergies: No Known Allergies  Past Medical History, Surgical history, Social history, and Family History were reviewed and updated.  Review of Systems: As above  Physical Exam:  weight is 175 lb 0.6 oz (79.4 kg). His oral temperature is 98.3 F (36.8 C). His blood pressure is 139/96 (abnormal) and his pulse is 79. His respiration is 19 and oxygen saturation is 100%.   Well-developed and well-nourished white gentleman. Head and neck exam shows no ocular or oral lesions. There are no palpable cervical or supraclavicular lymph nodes. Lungs are clear. Cardiac exam regular rate and rhythm with no murmurs, rubs or bruits.. Abdomen is soft. He has good bowel sounds. There is no fluid wave. There is no palpable liver or spleen tip. Back exam shows a thoracoscopy site in the left lateral chest wall. This is healed. There is no swelling or erythema. Extremities shows no clubbing, cyanosis or edema. Skin exam shows no rashes, ecchymoses or petechia . Neurological exam shows a little bit of a flat affect. He has a shuffling gait. I don't see any obvious tremor. Cranial nerves seem to be intact.  Lab Results  Component Value Date   WBC 4.7 07/19/2016   HGB 14.3  07/19/2016   HCT 41.7 07/19/2016   MCV 94 07/19/2016   PLT 206 07/19/2016     Chemistry      Component Value Date/Time   NA 140 07/19/2016 1305   K 4.4 07/19/2016 1305   CL 108 05/18/2016 1335   CL 105 02/08/2016 1117   CL 107 03/25/2013 0828   CO2 26 07/19/2016 1305   BUN 22.8 07/19/2016 1305   CREATININE 1.1 07/19/2016 1305      Component Value Date/Time   CALCIUM 9.8 07/19/2016 1305   ALKPHOS 76 07/19/2016 1305   AST 18 07/19/2016 1305   ALT 14 07/19/2016 1305   BILITOT 0.70 07/19/2016 1305         Impression and Plan: Brian [name] is a 56 year old gentleman.  He has history of recurrent  Hodgkin's disease. 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 He then had a recurrence after this.   It is not clear at all If the PET scan really shows recurrent disease. I went over the PET scan with him for about 30 minutes. He is focused clearly on the fact that he has recurrent disease. I just am not convinced of this. I realize that his history is troublesome for disability of his Hodgkin's to recur.  I just think that the lymph nodes are too small and not active enough to try to biopsy. I would favor follow-up in about 6-8 weeks. He is in a financial constraint because his insurance changes on July 1 and we need to get a test done before then.  He is just very anxious. I understand this. He is still dealing with the fact that his wife left him and is now moving in with a another man.  She has a lot going on in his life. This whole neurological problem is still a be a factor. He has Parkinson's.  I will have to get his PET scan on a disc and send to his doctor at [facility].  I spent about an hour with him. I just really feel bad for all that he is going through and has been through. To have Hodgkin's disease come back after his CAR-T cell therapy would really be a shoot problem.   We will see him back if his physicians at [facility] feel that is necessary for him to see [place]. For the most part, they are in charge.    [name], MD 5/15/20186:14 PM

## 2017-04-18 ENCOUNTER — Other Ambulatory Visit: Payer: Self-pay | Admitting: Hematology & Oncology

## 2017-04-18 DIAGNOSIS — C8190 Hodgkin lymphoma, unspecified, unspecified site: Secondary | ICD-10-CM

## 2017-04-20 ENCOUNTER — Encounter: Payer: Self-pay | Admitting: Hematology & Oncology

## 2017-05-01 ENCOUNTER — Ambulatory Visit (HOSPITAL_COMMUNITY): Payer: Commercial Managed Care - PPO

## 2017-05-23 ENCOUNTER — Ambulatory Visit (HOSPITAL_COMMUNITY)
Admission: RE | Admit: 2017-05-23 | Discharge: 2017-05-23 | Disposition: A | Discharge status: Home / Self Care | Payer: Commercial Managed Care - PPO | Source: Ambulatory Visit | Attending: Hematology & Oncology | Admitting: Hematology & Oncology

## 2017-05-23 DIAGNOSIS — N400 Enlarged prostate without lower urinary tract symptoms: Secondary | ICD-10-CM | POA: Diagnosis not present

## 2017-05-23 DIAGNOSIS — C8190 Hodgkin lymphoma, unspecified, unspecified site: Secondary | ICD-10-CM | POA: Diagnosis not present

## 2017-05-23 DIAGNOSIS — J320 Chronic maxillary sinusitis: Secondary | ICD-10-CM | POA: Insufficient documentation

## 2017-05-23 LAB — GLUCOSE, CAPILLARY: GLUCOSE-CAPILLARY: 104 mg/dL — AB (ref 65–99)

## 2017-05-23 MED ORDER — FLUDEOXYGLUCOSE F - 18 (FDG) INJECTION
8.7000 | Freq: Once | INTRAVENOUS | Status: AC | PRN
  Administered 2017-05-23: 8.7 via INTRAVENOUS

## 2017-06-05 DIAGNOSIS — C8190 Hodgkin lymphoma, unspecified, unspecified site: Secondary | ICD-10-CM | POA: Diagnosis not present

## 2017-06-05 DIAGNOSIS — Z9484 Stem cells transplant status: Secondary | ICD-10-CM | POA: Diagnosis not present

## 2017-06-06 ENCOUNTER — Ambulatory Visit (INDEPENDENT_AMBULATORY_CARE_PROVIDER_SITE_OTHER): Payer: Commercial Managed Care - PPO | Admitting: Family Medicine

## 2017-06-06 VITALS — BP 138/84 | HR 90 | Temp 98.2°F | Ht 70.0 in | Wt 177.2 lb

## 2017-06-06 DIAGNOSIS — C8192 Hodgkin lymphoma, unspecified, intrathoracic lymph nodes: Secondary | ICD-10-CM | POA: Diagnosis not present

## 2017-06-06 DIAGNOSIS — R269 Unspecified abnormalities of gait and mobility: Secondary | ICD-10-CM

## 2017-06-06 DIAGNOSIS — Z6379 Other stressful life events affecting family and household: Secondary | ICD-10-CM | POA: Diagnosis not present

## 2017-06-06 DIAGNOSIS — Z23 Encounter for immunization: Secondary | ICD-10-CM | POA: Diagnosis not present

## 2017-06-06 DIAGNOSIS — G20 Parkinson's disease: Secondary | ICD-10-CM

## 2017-06-06 NOTE — Progress Notes (Signed)
[place] at [facility] [address], [place], [place] [address] [phone]  Date:  06/06/2017   Name:  Brian [name]   DOB:  09/08/1961   MRN:  [id number]  PCP:  Patient, No Pcp Per    Chief Complaint: Establish Care (Pt here to est care. )   History of Present Illness:  Brian [name] is a 56 y.o. very pleasant male patient who presents with the following:  Here today as a new patient to my practice- former pt of my partner Dr. Everlene Farrier who is retired  He has Hodgkin lymphoma managed by Dr. Marin Olp and also Parkinson's disease managed by Dr. Carles Collet.  He is through with his intensive treatment at [facility] His PET scan from a couple of weeks ago showed possible progression of his disease  - the plan is for another PET in September  He was just dx with the Parkinson's disease last fall.  They noted a gait change which led to neuro eval and the diagnosis.  He is currently just taking mirapex TID for his PD- no other rx drugs  He did have a TIA along the way as well- may have been related to some of his chemotherpay  Ennever most recent note:  Brian [name] is a 56 year old gentleman.  He has history of recurrent Hodgkin's disease. 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 He then had a recurrence after this.   It is not clear at all If the PET scan really shows recurrent disease. I went over the PET scan with him for about 30 minutes. He is focused clearly on the fact that he has recurrent disease. I just am not convinced of this. I realize that his history is troublesome for disability of his Hodgkin's to recur.  I just think that the lymph nodes are too small and not active enough to try to biopsy. I would favor follow-up in about 6-8 weeks. He is in a financial constraint because his insurance changes on July 1 and we need to get a test done before then.  He is just very anxious. I understand this. He is still dealing with  the fact that his wife left him and is now moving in with a another man.  She has a lot going on in his life. This whole neurological problem is still a be a factor. He has Parkinson's.  Tat most recent note:  1.  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             -The patient had a DaT scan on 08/16/2016 that showed abnormal grade 3 uptake, virtually absent bilaterally in both putamen and caudate nuclei.              -We discussed the diagnosis as well as pathophysiology of the disease.  We discussed treatment options as well as prognostic indicators.  Patient education was provided.             -We decided to continue pramipexole 1 mg tid.  We discussed risks, benefits, and side effects.  We discussed risk of compulsive behavior and sleep attacks.  Understanding was expressed.             -continue biking for exercise.  He is doing rock steady boxing             -encouraged ACT as I think that he needs one on one training to help  with gait.  2.  Chemo (adcetris) induced peripheral neuropathy             -The patient has only mild clinical examination evidence of a diffuse peripheral neuropathy, and I do not think this can explain all of his symptoms.  I think it does explain his paresthesias that he has had since chemotherapy, but I am not sure it can explain his gait changes, and definitely cannot explain his hyperreflexia, which would be unusual in a neuropathy patient.  He may have always been hyperreflexic.  He refuses MRI cervical spine due to high deductible plan.              3.  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  4.  Hodgkins lymphoma, s/p autologous transplant with recurrence post transplant  5.  2016 transient (1-2 min) of word/speech trouble             -reviewed hospital records and agree that this likely did not  represent TIA and if anything, more consistent with seizure.  In any case, the event has not recurred.  6.  Depression, moderate, recurrent             -better after tx but still some anxiety.  Refuses psychiatry.  Much better than in the past as adjusts to dx of PD.  7. Follow up is anticipated in the next few months, sooner should new neurologic issues arise.  Much greater than 50% of this visit was spent in counseling and coordinating care.  Total face to face time:  30 min   He was in normal, good health until the age of 16 There is a very strong family history of cancer He was a runner for a long time for exercise- he is no longer competitive but hopes to be able to get back into running.  Right now he is walking and tries to get as much exercise as he can. 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  He has not tried any support groups as of yet.  He is able to rely on his friends and family. He is from [place].    He did have shingles in his 42s. He would like to   He had his immunotherapy last in April of 17- no recent chemo or any immunosuppressants.  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 He will get me a copy of this   Pulse Readings from Last 3 Encounters:  06/06/17 90  04/09/17 79  03/05/17 84    Patient Active Problem List   Diagnosis Date Noted  . PD (Parkinson's disease) ([place]) 11/27/2016  . Lymphadenopathy 07/28/2015  . Mediastinal adenopathy 07/14/2015  . Aphasia 03/04/2015  . Slurred speech 03/04/2015  . Pleural mass 05/31/2014  . Abdominal pain, epigastric 02/02/2014  . Weight loss 08/12/2012  . H/O Bell's palsy 08/12/2012  .  Hodgkin's  disease 08/09/2011    Past Medical History:  Diagnosis Date  . Anxiety   . Dysrhythmia    past hx pvc  . H/O autologous stem cell transplant ([place])    may 2014  at [facility]  . History of Bell's palsy    2009  RIGHT SIDE--  HAS 80% FUNCTION / PT STATES A LITTLE ASYMETRICAL AND EFFECTS MOUTH  . Hodgkin's disease, nodular sclerosis, of inguinal region/lower limb ([place]) ONOLOGIST--  DR ENNEVER AND A [facility]     [place] CHEMO 2013/  AUTOLOGUS STEM CELL TRANSPLANT MAY 2014 AT [facility]  . Mass of right inguinal region   . PVC (premature ventricular contraction)    "benign"  . TIA (transient ischemic attack) 02/2015   "probable TIA"  . Wears contact lenses     Past Surgical History:  Procedure Laterality Date  . AXILLARY LYMPH NODE BIOPSY Left 02/02/2013   Procedure: NEEDLE LOCALIZED AXILLARY LYMPH NODE BIOPSY;  Surgeon: Haywood Lasso, MD;  Location: [place];  Service: General;  Laterality: Left;  . BONE MARROW BIOPSY  08/2012  . CYST REMOVAL NECK Left 1980  . LEFT INGUINAL LYMPH NODE BX  09-09-2012  . LYMPH NODE BIOPSY N/A 07/28/2015   Procedure: LYMPH NODE BIOPSY;  Surgeon: Melrose Nakayama, MD;  Location: [place];  Service: Thoracic;  Laterality: N/A;  . NODE DISSECTION Right 07/28/2015   Procedure: NODE DISSECTION;  Surgeon: Melrose Nakayama, MD;  Location: [place];  Service: Thoracic;  Laterality: Right;  . PLEURA BIOPSY Left 05/31/2014  . REMOVAL RIGHT INGUINAL LYMPH NODES  08-16-2011  . SCROTAL EXPLORATION Right 01/04/2014   Procedure: SCROTUM EXPLORATION   INGUINAL , EXCISION OF CYSTIC MASS OF RIGHT SPERMATIC CORD, WITH FROZEN SECTION;  Surgeon: Franchot Gallo, MD;  Location: [facility];  Service: Urology;  Laterality: Right;  . TRANSTHORACIC ECHOCARDIOGRAM  03-18-2013   MILD LVH/  EF 55-60%  . VIDEO ASSISTED THORACOSCOPY Left 05/31/2014   Procedure: LEFT VIDEO ASSISTED THORACOSCOPY, PLEURAL BIOPSY;  Surgeon: Melrose Nakayama, MD;  Location: [place];   Service: Thoracic;  Laterality: Left;  Marland Kitchen VIDEO ASSISTED THORACOSCOPY Right 07/28/2015   Procedure: RIGHT VIDEO ASSISTED THORACOSCOPY;  Surgeon: Melrose Nakayama, MD;  Location: [place];  Service: Thoracic;  Laterality: Right;  Marland Kitchen VIDEO ASSISTED THORACOSCOPY (VATS)/ LYMPH NODE SAMPLING Right 07/28/2015  . VIDEO ASSISTED THORACOSCOPY (VATS)/WEDGE RESECTION  05/31/2014  . VIDEO BRONCHOSCOPY WITH ENDOBRONCHIAL ULTRASOUND  07/28/2015  . VIDEO BRONCHOSCOPY WITH ENDOBRONCHIAL ULTRASOUND N/A 07/28/2015   Procedure: VIDEO BRONCHOSCOPY WITH ENDOBRONCHIAL ULTRASOUND;  Surgeon: Melrose Nakayama, MD;  Location: [place];  Service: Thoracic;  Laterality: N/A;    Social History  Substance Use Topics  . Smoking status: Never Smoker  . Smokeless tobacco: Never Used  . Alcohol use 8.4 oz/week    7 Cans of beer, 7 Glasses of wine per week    Family History  Problem Relation Age of Onset  . Cancer Mother 104       ovarian  . Heart disease Father   . Stroke Father   . Cancer Sister        ovarian  . Cancer Brother        testicular    No Known Allergies  Medication list has been reviewed and updated.  Current Outpatient Prescriptions on File Prior to Visit  Medication Sig Dispense Refill  . acetaminophen (TYLENOL) 325 MG tablet Take 325 mg by mouth every 6 (six) hours as  needed for mild pain.    Marland Kitchen aspirin 81 MG chewable tablet Chew 1 tablet (81 mg total) by mouth daily. 30 tablet 0  . Cholecalciferol (VITAMIN D-3) 1000 UNITS CAPS Take 1 capsule by mouth daily.     . pramipexole (MIRAPEX) 1 MG tablet Take 1 tablet (1 mg total) by mouth 3 (three) times daily. 270 tablet 1   No current facility-administered medications on file prior to visit.     Review of Systems:  As per HPI- otherwise negative. He will have night sweats off an on- had one last week Weight is "going up."    Physical Examination: Vitals:   06/06/17 1324 06/06/17 1349  BP: 138/84   Pulse: (!) 106 90  Temp: 98.2 F (36.8 C)     Vitals:   06/06/17 1324  Weight: 177 lb 3.2 oz (80.4 kg)  Height: 5' 10"  (1.778 m)   Body mass index is 25.43 kg/m. Ideal Body Weight: Weight in (lb) to have BMI = 25: 173.9  GEN: WDWN, NAD, Non-toxic, A & O x 3, normal weight, looks well HEENT: Atraumatic, Normocephalic. Neck supple. No masses, No LAD. Ears and Nose: No external deformity. CV: RRR, No M/G/R. No JVD. No thrill. No extra heart sounds. PULM: CTA B, no wheezes, crackles, rhonchi. No retractions. No resp. distress. No accessory muscle use. ABD: S, NT, ND, +BS. No rebound. No HSM. EXTR: No c/c/e NEURO mild evidence of PD in his gait and upper limbs, voice PSYCH: Normally interactive. Conversant. Not depressed or anxious appearing.  Calm demeanor.    Assessment and Plan: Parkinson's disease ([place])  Immunization due - Plan: Td : Tetanus/diphtheria >7yo Preservative  free  Hodgkin lymphoma of intrathoracic lymph nodes, unspecified Hodgkin lymphoma type ([place])  Neurologic gait disorder  Stressful life event affecting family  Here today to establish care with me Plain Td vaccine today He is thinking about shingrix but is not ready to get this today Continue to follow-up with heme/onc and neurology as planned He will let me know if I can help, and will send me a copy of his most recent labs   Signed Lamar Blinks, MD

## 2017-06-06 NOTE — Patient Instructions (Addendum)
It was nice to meet you today!  Take care and let me know if you need anything at all. When you think of it, please mail [place] a copy of your cholesterol, etc from your work health fair You got your tetanus booster today which is good for 10 years

## 2017-06-11 ENCOUNTER — Telehealth: Payer: Self-pay | Admitting: *Deleted

## 2017-06-11 NOTE — Telephone Encounter (Signed)
Received Medical records from [facility] requested by provider, forwarded to provider/SLS 07/17

## 2017-06-13 ENCOUNTER — Encounter: Payer: Self-pay | Admitting: Family Medicine

## 2017-06-13 NOTE — Progress Notes (Unsigned)
Chloride 102 mmol/L Carbon Dioxide, Total: 28 mmol/L Calcium: 9.8 mg/dL LDL/HDL Ratio: 2.6 ratio VLDL Cholesterol Cal 14 mg/dL

## 2017-07-02 ENCOUNTER — Other Ambulatory Visit: Payer: Self-pay | Admitting: Neurology

## 2017-07-09 ENCOUNTER — Ambulatory Visit (INDEPENDENT_AMBULATORY_CARE_PROVIDER_SITE_OTHER): Payer: Commercial Managed Care - PPO | Admitting: Neurology

## 2017-07-09 ENCOUNTER — Encounter: Payer: Self-pay | Admitting: Neurology

## 2017-07-09 VITALS — BP 130/84 | HR 86 | Ht 70.0 in | Wt 178.0 lb

## 2017-07-09 DIAGNOSIS — G20 Parkinson's disease: Secondary | ICD-10-CM

## 2017-07-09 MED ORDER — CARBIDOPA-LEVODOPA 25-100 MG PO TABS: 1.0000 | ORAL_TABLET | Freq: Three times a day (TID) | ORAL | 1 refills | Status: DC

## 2017-07-09 NOTE — Patient Instructions (Signed)
1. Start Carbidopa Levodopa as follows:  Take 1/2 tablet three times daily, at least 30 minutes before meals, for one week  Then take 1/2 tablet in the morning, 1/2 tablet in the afternoon, 1 tablet in the evening, at least 30 minutes before meals, for one week  Then take 1/2 tablet in the morning, 1 tablet in the afternoon, 1 tablet in the evening, at least 30 minutes before meals, for one week  Then take 1 tablet three times daily, at least 30 minutes before meals  2. Continue Pramipexole.   3. On referral to [facility], we would recommend Dr. Mervyn Skeeters in the [facility].

## 2017-07-09 NOTE — Progress Notes (Signed)
Brian [name] was seen today in the movement disorders clinic for neurologic consultation at the request of Copland, [name], MD.  The consultation is for the evaluation of parkinsonism.  I have reviewed numerous records made available to me.  The patient previously saw Dr. [name] and subsequently Dr. [name].  He saw Dr. [name] when he was in the hospital in April, 2016.  That was for an event in which he experienced 1-2 minutes of speech and word finding trouble.  Dr. [name] doubted that was a TIA.  He had an EEG that was negative.  He had an MRI of the brain on 03/04/2015 without gadolinium that I had the opportunity to review.  There were a very few scattered T2 hyperintensities, but diffusion-weighted imaging was negative.  MRSA was negative.  He followed up with Dr. [name] one time on an outpatient basis, but nothing further was revealed.  Today, the patient presents because of shuffling of the feet for about 8 months.  Pt states that it started dragging of the L foot but now seems to be dragging both.  He has also noted handwriting and voice changes and has become concerned about the possibility of Parkinson's disease.  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  He also has a history of chemotherapy-induced peripheral neuropathy (Adcetris).  He asks about "psychogenic stuff."  States that he has been through divorce along with his cancer.  Asks if his sx's could be from stress as he has been reading about psychogenic manifestations of sx's.  08/22/16 update: The patient follows up today for testing results.  This patient is accompanied in the office by his ex wife who supplements the history.  He had a DaT scan done on 08/16/16 and showed abnormal grade 3 uptake, virtually absent bilaterally in both putamen and caudate nuclei.    He has not had any falls since last visit.  Continues to have difficulty with picking up the feet.  He denies lightheadedness or near syncope.  He denies hallucinations.  He continues to have issues with depression.  No SI/HI.  Ex wife concerned about mood.  Also noting stiffness, some drooling, constipation.    11/27/16 update: The patient follows up today, accompanied by his ex-wife (and friend) who supplements the history.  The patient was started on pramipexole last visit and worked up to 0.5 mg 3 times per day.  He denies any compulsive behaviors or sleep attacks.  He states that he is doing better but not as good as he thought that he would.  He is shuffling some.  He is exercising with his bike.  He has been only to 2 physical therapy sessions.  He declined speech and occupational therapy, despite the fact that the therapist recommended them as did I.  He has not had any falls.  Denies lightheadedness or syncope.  He continues to struggle with depression but states that mood has been better.  Last visit, he was given names of psychiatrist but refused to go, stating that he did not need a psychiatrist and had a Social worker.  He called me for a prescription for Xanax, but this is not a medication that I generally recommended my Parkinson's patients.  03/05/17 update:  Patient follows up today.  He is unaccompanied today.   The  patient is on pramipexole but we increased that last visit to 1.0 mg tid.  No falls but some near falls.  He is doing rock steady one day a week.  There've been no compulsive behaviors.  No sleep attacks.  No hallucinations.  No lightheadedness or near syncope.  He was seen last by the bone marrow transplant team at [facility] in January, 2018.  I have reviewed those records.  He is currently disease-free from a lymphoma standpoint.  07/09/17 update:  Pt f/u today for PD.  The records that were made available to me were reviewed since last visit.  He had a repeat PET scan at [facility] that  was also interpreted at [facility].  There were hypermetabolic nodes in the mediastinal region.  Findings were concerning for recurrence of lymphoma.  Pt states that the plan is to repeat it in September.  In progress to Parkinson's disease, the patient is still on pramipexole 1.0 mg tid.  He is feeling like he is always walking on the balls of the feet and describing a festinating gait.  He has some feeling of retropulsion.  No compulsive behaviors.  No falls.  No lightheadedness/near syncope.  He is exercising on his bike a few times per week.  He is "profusely sweating."  Did fall out of bed onto the nightstand while asleep.  ALLERGIES:  No Known Allergies  CURRENT MEDICATIONS:  Outpatient Encounter Prescriptions as of 07/09/2017  Medication Sig  . acetaminophen (TYLENOL) 325 MG tablet Take 325 mg by mouth every 6 (six) hours as needed for mild pain.  Marland Kitchen aspirin 81 MG chewable tablet Chew 1 tablet (81 mg total) by mouth daily.  . Cholecalciferol (VITAMIN D-3) 1000 UNITS CAPS Take 1 capsule by mouth daily.   . Multiple Vitamin (MULTIVITAMIN) tablet Take 1 tablet by mouth daily.  . pramipexole (MIRAPEX) 1 MG tablet take 1 tablet by mouth three times a day   No facility-administered encounter medications on file as of 07/09/2017.     PAST MEDICAL HISTORY:   Past Medical History:  Diagnosis Date  . Anxiety   . Dysrhythmia    past hx pvc  . H/O autologous stem cell transplant ([place])    may 2014  at [facility]  . History of Bell's palsy    2009  RIGHT SIDE--  HAS 80% FUNCTION / PT STATES A LITTLE ASYMETRICAL AND EFFECTS MOUTH  . Hodgkin's disease, nodular sclerosis, of inguinal region/lower limb ([place]) ONOLOGIST--  DR ENNEVER AND A [facility]     S[place] CHEMO 2013/  AUTOLOGUS STEM CELL TRANSPLANT MAY 2014 AT [facility]  . Mass of right inguinal region   . PVC (premature ventricular contraction)    "benign"  . TIA (transient ischemic attack) 02/2015   "probable TIA"  . Wears contact lenses     PAST SURGICAL  HISTORY:   Past Surgical History:  Procedure Laterality Date  . AXILLARY LYMPH NODE BIOPSY Left 02/02/2013   Procedure: NEEDLE LOCALIZED AXILLARY LYMPH NODE BIOPSY;  Surgeon: Haywood Lasso, MD;  Location: [place];  Service: General;  Laterality: Left;  . BONE MARROW BIOPSY  08/2012  . CYST REMOVAL NECK Left 1980  . LEFT INGUINAL LYMPH NODE BX  09-09-2012  . LYMPH NODE BIOPSY N/A 07/28/2015   Procedure: LYMPH NODE BIOPSY;  Surgeon: Melrose Nakayama, MD;  Location: [place];  Service: Thoracic;  Laterality: N/A;  . NODE DISSECTION Right 07/28/2015   Procedure: NODE DISSECTION;  Surgeon: Melrose Nakayama, MD;  Location: [facility];  Service: Thoracic;  Laterality: Right;  . PLEURA BIOPSY Left 05/31/2014  . REMOVAL RIGHT INGUINAL LYMPH NODES  08-16-2011  . SCROTAL EXPLORATION Right 01/04/2014   Procedure: SCROTUM EXPLORATION   INGUINAL , EXCISION OF CYSTIC MASS OF RIGHT SPERMATIC CORD, WITH FROZEN SECTION;  Surgeon: Franchot Gallo, MD;  Location: [facility] [place];  Service: Urology;  Laterality: Right;  . TRANSTHORACIC ECHOCARDIOGRAM  03-18-2013   MILD LVH/  EF 55-60%  . VIDEO ASSISTED THORACOSCOPY Left 05/31/2014   Procedure: LEFT VIDEO ASSISTED THORACOSCOPY, PLEURAL BIOPSY;  Surgeon: Melrose Nakayama, MD;  Location: [place];  Service: Thoracic;  Laterality: Left;  Marland Kitchen VIDEO ASSISTED THORACOSCOPY Right 07/28/2015   Procedure: RIGHT VIDEO ASSISTED THORACOSCOPY;  Surgeon: Melrose Nakayama, MD;  Location: [place];  Service: Thoracic;  Laterality: Right;  Marland Kitchen VIDEO ASSISTED THORACOSCOPY (VATS)/ LYMPH NODE SAMPLING Right 07/28/2015  . VIDEO ASSISTED THORACOSCOPY (VATS)/WEDGE RESECTION  05/31/2014  . VIDEO BRONCHOSCOPY WITH ENDOBRONCHIAL ULTRASOUND  07/28/2015  . VIDEO BRONCHOSCOPY WITH ENDOBRONCHIAL ULTRASOUND N/A 07/28/2015   Procedure: VIDEO BRONCHOSCOPY WITH ENDOBRONCHIAL ULTRASOUND;  Surgeon: Melrose Nakayama, MD;  Location: [place];  Service: Thoracic;  Laterality: N/A;     SOCIAL HISTORY:   Social History   Social History  . Marital status: Legally Separated    Spouse name: N/A  . Number of children: N/A  . Years of education: N/A   Occupational History  . Arboriculturist Communication   Social History Main Topics  . Smoking status: Never Smoker  . Smokeless tobacco: Never Used  . Alcohol use 8.4 oz/week    7 Cans of beer, 7 Glasses of wine per week  . Drug use: No  . Sexual activity: Not Currently   Other Topics Concern  . Not on file   Social History Narrative   Married. Education: [employer]. Exercise: jog/walk/ lift weights 7 days a week, 1-2 miles.    FAMILY HISTORY:   Family Status  Relation Status  . Mother Deceased  . Father Deceased  . Sister Alive  . Brother Alive  . Son Alive    ROS:  Feet and toe paresthesias with neuropathy.  A complete 10 system review of systems was obtained and was unremarkable apart from what is mentioned above.  PHYSICAL EXAMINATION:    VITALS:   Vitals:   07/09/17 1526  BP: 130/84  Pulse: 86  SpO2: 98%  Weight: 178 lb (80.7 kg)  Height: '5\' 10"'$  (1.778 m)     Wt Readings from Last 3 Encounters:  07/09/17 178 lb (80.7 kg)  06/06/17 177 lb 3.2 oz (80.4 kg)  04/09/17 175 lb 0.6 oz (79.4 kg)   Temp Readings from Last 3 Encounters:  06/06/17 98.2 F (36.8 C) (Oral)  04/09/17 98.3 F (36.8 C) (Oral)  07/19/16 98.2 F (36.8 C) (Oral)   BP Readings from Last 3 Encounters:  07/09/17 130/84  06/06/17 138/84  04/09/17 (!) 139/96   Pulse Readings from Last 3 Encounters:  07/09/17 86  06/06/17 90  04/09/17 79     GEN:  The patient appears stated age and is in NAD.  Laughs today HEENT:  Normocephalic, atraumatic.  The mucous membranes are moist. The superficial temporal arteries are without ropiness or tenderness. CV:    Regular rate and rhythm. Lungs:  CTAB Neck/HEME:  There are no carotid bruits bilaterally.  Neurological examination:  Orientation: The patient is alert and oriented  x3. Fund of knowledge is appropriate.  Cranial nerves: There  is slight right facial droop, only noticeable because of decreased nasolabial fold on the right.  He is able to symmetrically activate the muscles of facial expression.  He does have abnormal synkinetic reinnervation of the muscles of facial expression on the right.  He does not have a Bell's phenomenon.  There is facial hypomimia.    The speech is fluent and clear.  He is hypophonic.  Soft palate rises symmetrically and there is no tongue deviation. Hearing is intact to conversational tone. Sensation: Sensation is intact to light throughout.   Movement examination: Tone: There is LUE rigidity with flexed L forearm Abnormal movements: None Coordination:  There is trouble with alternation of supination/pronation on the L.  He has trouble "turning in a lightbulb" on the L Gait and Station: The patient has no difficulty arising out of a deep-seated chair without the use of the hands. The patient's stride length is normal, although he does drag the feet a little.  He walks on his tip toes and does festinate.  He is unsteady.  He is able to run down the hall normally. The patient has a negative pull test.    He sways in the Romberg position with eyes closed.  ASSESSMENT/PLAN:  1.   Parkinson's disease.  -The patient had a DaT scan on 08/16/2016 that showed abnormal grade 3 uptake, virtually absent bilaterally in both putamen and caudate nuclei.   -We discussed the diagnosis as well as pathophysiology of the disease.  We discussed treatment options as well as prognostic indicators.  Patient education was provided.  -We decided to continue pramipexole 1 mg tid.  We discussed risks, benefits, and side effects.  We discussed risk of compulsive behavior and sleep attacks.  Understanding was expressed.  -talked about starting levodopa and benefits of doing so.  He asked lots of questions.  We will work to carbidopa/levodopa 25/100 tid.  Risks,  benefits, side effects and alternative therapies were discussed.  The opportunity to ask questions was given and they were answered to the best of my ability.  The patient expressed understanding and willingness to follow the outlined treatment protocols.  Discussed risks of dyskinesia and what that means.  -pt wants 2nd opinion at [facility].  Recommended [facility] interdisciplinary clinic.  2.  Chemo (adcetris) induced peripheral neuropathy  -The patient has only mild clinical examination evidence of a diffuse peripheral neuropathy, and I do not think this can explain all of his symptoms.  I think it does explain his paresthesias that he has had since chemotherapy, but I am not sure it can explain his gait changes, and definitely cannot explain his hyperreflexia, which would be unusual in a neuropathy patient.  He may have always been hyperreflexic.  He refuses MRI cervical spine due to high deductible plan.   3.  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  4.  Hodgkins lymphoma, s/p autologous transplant with recurrence post transplant  -recent PET concerning for possible recurrence.  Repeat PET to be done in September  5.  2016 transient (1-2 min) of word/speech trouble  -reviewed hospital records and agree that this likely did not represent TIA and if anything, more consistent with seizure.  In any case, the event has not recurred.  6.  Depression, moderate, recurrent  -better after tx but still some anxiety.  Refuses psychiatry.  Much better than in the past  as adjusts to dx of PD.  7.  RBD  -He fell out of the bed.  He doesn't want klonopin  -This is commonly associated with PD and the patient is experiencing this.  We discussed that this can be very serious and even harmful.  We talked about medications as well as physical barriers to put in the bed (particularly soft bed rails,  pillow barriers).  We talked about moving the night stand so that it is not so close to the side of the bed.  8. Follow up is anticipated in the next few months, sooner should new neurologic issues arise.  Much greater than 50% of this visit was spent in counseling and coordinating care.  Total face to face time:  35 min

## 2017-07-30 ENCOUNTER — Other Ambulatory Visit: Payer: Self-pay | Admitting: *Deleted

## 2017-07-30 ENCOUNTER — Encounter: Payer: Self-pay | Admitting: Hematology & Oncology

## 2017-07-30 DIAGNOSIS — C8195 Hodgkin lymphoma, unspecified, lymph nodes of inguinal region and lower limb: Secondary | ICD-10-CM

## 2017-08-22 ENCOUNTER — Other Ambulatory Visit: Payer: Self-pay | Admitting: Hematology & Oncology

## 2017-08-22 ENCOUNTER — Encounter (HOSPITAL_COMMUNITY)
Admission: RE | Admit: 2017-08-22 | Discharge: 2017-08-22 | Disposition: A | Discharge status: Home / Self Care | Payer: Commercial Managed Care - PPO | Source: Ambulatory Visit | Attending: Hematology & Oncology | Admitting: Hematology & Oncology

## 2017-08-22 DIAGNOSIS — C8195 Hodgkin lymphoma, unspecified, lymph nodes of inguinal region and lower limb: Secondary | ICD-10-CM

## 2017-08-22 DIAGNOSIS — C8190 Hodgkin lymphoma, unspecified, unspecified site: Secondary | ICD-10-CM | POA: Diagnosis not present

## 2017-08-22 LAB — GLUCOSE, CAPILLARY: GLUCOSE-CAPILLARY: 96 mg/dL (ref 65–99)

## 2017-08-22 MED ORDER — FLUDEOXYGLUCOSE F - 18 (FDG) INJECTION
9.3000 | Freq: Once | INTRAVENOUS | Status: AC
  Administered 2017-08-22: 9.3 via INTRAVENOUS

## 2017-08-28 ENCOUNTER — Telehealth: Payer: Self-pay | Admitting: Family Medicine

## 2017-08-28 ENCOUNTER — Encounter: Payer: Self-pay | Admitting: Hematology & Oncology

## 2017-08-28 NOTE — Telephone Encounter (Signed)
Called him back-  They plan to do an MRI to look at his T1 in more detail.  I agree that this is the next best step

## 2017-08-28 NOTE — Telephone Encounter (Signed)
Caller name: Brian [name]  Relation to pt: self  Call back number:[phone] Pharmacy:  Reason for call: Pt would like provider to call him regarding about a PET scan done (referred by Dr Marin Olp). Pt saw the results on mychart and has some questions and concerns about results. Please advise.

## 2017-09-06 ENCOUNTER — Ambulatory Visit
Admission: RE | Admit: 2017-09-06 | Discharge: 2017-09-06 | Disposition: A | Discharge status: Home / Self Care | Payer: Commercial Managed Care - PPO | Source: Ambulatory Visit | Attending: Hematology & Oncology | Admitting: Hematology & Oncology

## 2017-09-06 DIAGNOSIS — C8195 Hodgkin lymphoma, unspecified, lymph nodes of inguinal region and lower limb: Secondary | ICD-10-CM

## 2017-09-06 DIAGNOSIS — C412 Malignant neoplasm of vertebral column: Secondary | ICD-10-CM | POA: Diagnosis not present

## 2017-09-06 MED ORDER — GADOBENATE DIMEGLUMINE 529 MG/ML IV SOLN
15.0000 mL | Freq: Once | INTRAVENOUS | Status: AC | PRN
  Administered 2017-09-06: 15 mL via INTRAVENOUS

## 2017-09-09 ENCOUNTER — Other Ambulatory Visit: Payer: Self-pay | Admitting: Hematology & Oncology

## 2017-09-09 DIAGNOSIS — C8190 Hodgkin lymphoma, unspecified, unspecified site: Secondary | ICD-10-CM

## 2017-09-12 ENCOUNTER — Encounter: Payer: Self-pay | Admitting: Hematology & Oncology

## 2017-09-16 ENCOUNTER — Telehealth: Payer: Self-pay | Admitting: Family Medicine

## 2017-09-16 NOTE — Telephone Encounter (Signed)
Mr Thoracic Spine W Wo Contrast  Result Date: 09/06/2017 CLINICAL DATA:  Abnormality a T1 seen on PET CT EXAM: MRI THORACIC WITHOUT AND WITH CONTRAST TECHNIQUE: Multiplanar and multiecho pulse sequences of the thoracic spine were obtained without and with intravenous contrast. CONTRAST:  36m MULTIHANCE GADOBENATE DIMEGLUMINE 529 MG/ML IV SOLN COMPARISON:  PET CT 08/22/2017 FINDINGS: MRI THORACIC SPINE FINDINGS Alignment:  Physiologic. Vertebrae: There is a low T1/high T2 weighted signal lesion within the left transverse process of T1, corresponding to the hypermetabolic abnormality on the PET CT. This shows contrast enhancement on post gadolinium imaging. The lesion extends to the left T1 articular pillar. No other focal marrow lesions are identified. No compression fracture or other acute finding. Cord:  Normal signal and morphology. Paraspinal and other soft tissues: Negative. Disc levels: No disc herniation, spinal canal stenosis or nerve root impingement. IMPRESSION: 1. 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 2. No other osseous metastases of the thoracic spine. No epidural metastatic disease. 3. No spinal canal stenosis or neural impingement. Electronically Signed   By: KUlyses JarredM.D.   On: 09/06/2017 19:00   Nm Pet Image Restag (ps) Skull Base To Thigh  Result Date: 08/22/2017 CLINICAL DATA:  Subsequent treatment strategy for Hodgkin's lymphoma. EXAM: NUCLEAR MEDICINE PET SKULL BASE TO THIGH TECHNIQUE: 9.3 mCi F-18 FDG was injected intravenously. Full-ring PET imaging was performed from the skull base to thigh after the radiotracer. CT data was obtained and used for attenuation correction and anatomic localization. FASTING BLOOD GLUCOSE:  Value: 96 mg/dl COMPARISON:  PET-CT May 23, 2017 FINDINGS: NECK: There is increased uptake in the region of the right tonsillar fossa versus the left extending towards the right vallecula. There may be  subtle mild soft tissue fullness in this region on CT images. The maximum SUV is 5.6. There is increased uptake in the region of the left T1 transverse process. No CT correlate identified in this region. No soft tissue nodule or fracture noted. The maximum SUV is 5.3. No other abnormal FDG uptake in the head or neck regions. The tiny nodule in the left thoracic inlet on the previous study is not visualized today. There is no abnormal uptake in this region. CHEST: The prevascular node described on the previous study is seen on series 4, image 59 today measuring 7 mm in short axis today versus 8 mm previously. There is no increased FDG uptake in this region. The maximum SUV is 1.4 today versus 4.6 previously. The right paratracheal node described previously is seen on series 4, image 56 today measuring 4 mm today versus 5 mm previously with no abnormal FDG uptake discernible from background. No other abnormal uptake seen in the chest. ABDOMEN/PELVIS: No abnormal hypermetabolic activity within the liver, pancreas, adrenal glands, or spleen. No hypermetabolic lymph nodes in the abdomen or pelvis. SKELETON: No focal hypermetabolic activity to suggest skeletal metastasis. IMPRESSION: 1. There is focal increased uptake in the region of the left T1 transverse process with no CT correlate. The level and focal nature of the uptake is concerning. Recommend attention on follow-up. If further evaluation is clinically necessary, an MRI could further evaluate this region for bony or soft tissue involvement. 2. There is increased uptake in the right tonsillar fossa with subtle soft tissue fullness. This is a nonspecific finding and could represent asymmetric physiologic uptake. Recommend clinical correlation and attention on follow-up. 3. The previously identified nodes in the chest have returned to  normal as above. 4. No other FDG avid disease. Electronically Signed   By: Dorise Bullion III M.D   On: 08/22/2017 14:03   Called  him back to go over his MRI.  He is concerned- possible spine met.  However there is no tissue bx yet- he is getting this done 2 days from now. Discussed with him and answered all questions to the best of my ability

## 2017-09-16 NOTE — Telephone Encounter (Signed)
Pt request Dr Lorelei Pont call him for clarification and questions pertaining to MRI results in MyChart. Pt request "Cop[place] return call to him with MRI results pulled up as they talk."

## 2017-09-17 ENCOUNTER — Other Ambulatory Visit: Payer: Self-pay | Admitting: Radiology

## 2017-09-17 ENCOUNTER — Other Ambulatory Visit: Payer: Self-pay | Admitting: Physician Assistant

## 2017-09-18 ENCOUNTER — Ambulatory Visit (HOSPITAL_COMMUNITY)
Admission: RE | Admit: 2017-09-18 | Discharge: 2017-09-18 | Disposition: A | Discharge status: Home / Self Care | Payer: Commercial Managed Care - PPO | Source: Ambulatory Visit | Attending: Hematology & Oncology | Admitting: Hematology & Oncology

## 2017-09-18 ENCOUNTER — Other Ambulatory Visit: Payer: Self-pay | Admitting: General Surgery

## 2017-09-18 ENCOUNTER — Other Ambulatory Visit: Payer: Self-pay | Admitting: Radiology

## 2017-09-19 ENCOUNTER — Encounter (HOSPITAL_COMMUNITY): Payer: Self-pay

## 2017-09-19 ENCOUNTER — Ambulatory Visit (HOSPITAL_COMMUNITY)
Admission: RE | Admit: 2017-09-19 | Discharge: 2017-09-19 | Disposition: A | Discharge status: Home / Self Care | Payer: Commercial Managed Care - PPO | Source: Ambulatory Visit | Attending: Hematology & Oncology | Admitting: Hematology & Oncology

## 2017-09-19 DIAGNOSIS — F419 Anxiety disorder, unspecified: Secondary | ICD-10-CM | POA: Diagnosis not present

## 2017-09-19 DIAGNOSIS — Z8572 Personal history of non-Hodgkin lymphomas: Secondary | ICD-10-CM | POA: Diagnosis not present

## 2017-09-19 DIAGNOSIS — G20 Parkinson's disease: Secondary | ICD-10-CM | POA: Diagnosis not present

## 2017-09-19 DIAGNOSIS — Z8571 Personal history of Hodgkin lymphoma: Secondary | ICD-10-CM | POA: Diagnosis not present

## 2017-09-19 DIAGNOSIS — Z8249 Family history of ischemic heart disease and other diseases of the circulatory system: Secondary | ICD-10-CM | POA: Diagnosis not present

## 2017-09-19 DIAGNOSIS — Z8043 Family history of malignant neoplasm of testis: Secondary | ICD-10-CM | POA: Insufficient documentation

## 2017-09-19 DIAGNOSIS — I493 Ventricular premature depolarization: Secondary | ICD-10-CM | POA: Diagnosis not present

## 2017-09-19 DIAGNOSIS — C91Z0 Other lymphoid leukemia not having achieved remission: Secondary | ICD-10-CM | POA: Diagnosis not present

## 2017-09-19 DIAGNOSIS — C8190 Hodgkin lymphoma, unspecified, unspecified site: Secondary | ICD-10-CM

## 2017-09-19 DIAGNOSIS — Z9889 Other specified postprocedural states: Secondary | ICD-10-CM | POA: Diagnosis not present

## 2017-09-19 DIAGNOSIS — Z8041 Family history of malignant neoplasm of ovary: Secondary | ICD-10-CM | POA: Diagnosis not present

## 2017-09-19 DIAGNOSIS — Z79899 Other long term (current) drug therapy: Secondary | ICD-10-CM | POA: Diagnosis not present

## 2017-09-19 DIAGNOSIS — Z8673 Personal history of transient ischemic attack (TIA), and cerebral infarction without residual deficits: Secondary | ICD-10-CM | POA: Diagnosis not present

## 2017-09-19 DIAGNOSIS — M488X4 Other specified spondylopathies, thoracic region: Secondary | ICD-10-CM | POA: Diagnosis not present

## 2017-09-19 DIAGNOSIS — Z823 Family history of stroke: Secondary | ICD-10-CM | POA: Insufficient documentation

## 2017-09-19 DIAGNOSIS — M898X8 Other specified disorders of bone, other site: Secondary | ICD-10-CM | POA: Diagnosis not present

## 2017-09-19 DIAGNOSIS — Z7982 Long term (current) use of aspirin: Secondary | ICD-10-CM | POA: Diagnosis not present

## 2017-09-19 LAB — CBC
HEMATOCRIT: 42.8 % (ref 39.0–52.0)
Hemoglobin: 14 g/dL (ref 13.0–17.0)
MCH: 31.5 pg (ref 26.0–34.0)
MCHC: 32.7 g/dL (ref 30.0–36.0)
MCV: 96.4 fL (ref 78.0–100.0)
PLATELETS: 233 10*3/uL (ref 150–400)
RBC: 4.44 MIL/uL (ref 4.22–5.81)
RDW: 13.3 % (ref 11.5–15.5)
WBC: 5.1 10*3/uL (ref 4.0–10.5)

## 2017-09-19 LAB — PROTIME-INR
INR: 1.03
PROTHROMBIN TIME: 13.4 s (ref 11.4–15.2)

## 2017-09-19 LAB — APTT: APTT: 25 s (ref 24–36)

## 2017-09-19 MED ORDER — MIDAZOLAM HCL 2 MG/2ML IJ SOLN
INTRAMUSCULAR | Status: AC | PRN
  Administered 2017-09-19: 0.5 mg via INTRAVENOUS
  Administered 2017-09-19: 1 mg via INTRAVENOUS

## 2017-09-19 MED ORDER — SODIUM CHLORIDE 0.9 % IV SOLN
INTRAVENOUS | Status: AC | PRN
  Administered 2017-09-19: 10 mL/h via INTRAVENOUS

## 2017-09-19 MED ORDER — SODIUM CHLORIDE 0.9 % IV SOLN: INTRAVENOUS | Status: DC

## 2017-09-19 MED ORDER — MIDAZOLAM HCL 2 MG/2ML IJ SOLN
INTRAMUSCULAR | Status: AC
  Filled 2017-09-19: qty 4

## 2017-09-19 MED ORDER — FENTANYL CITRATE (PF) 100 MCG/2ML IJ SOLN
INTRAMUSCULAR | Status: AC | PRN
  Administered 2017-09-19: 50 ug via INTRAVENOUS
  Administered 2017-09-19 (×2): 25 ug via INTRAVENOUS

## 2017-09-19 MED ORDER — FENTANYL CITRATE (PF) 100 MCG/2ML IJ SOLN
INTRAMUSCULAR | Status: AC
  Filled 2017-09-19: qty 4

## 2017-09-19 NOTE — Procedures (Signed)
Pre procedural Dx: History of lymphoma, now with hypermetabolic lesion involving the L T1 transverse process.  Post procedural Dx: Same  Technically successful CT guided biopsy of L T1 transverse process.   EBL: None.   Complications: None immediate.   Ronny Bacon, MD Pager #: [phone]

## 2017-09-19 NOTE — H&P (Signed)
Chief Complaint: Metastatic lesion of T1  Referring Physician(s): Ennever,Peter R  Supervising Physician: Sandi Mariscal  Patient Status: [facility] - Out-pt  History of Present Illness: Brian [name] is a 56 y.o. male with Hodgkin's lymphoma managed by Dr. Marin Olp.  He also has a diagnosis of Parkinson's disease managed by Dr. Carles Collet.   He has undergone previous intensive treatment at [facility].   His PET scan from 08/22/2017 showed possible progression of his disease.  He was diagnosed with Parkinson's disease last fall.  A gait change was noticed which led to neuro eval and the diagnosis.   MRI done 09/06/2017 showed a focal metastatic lesion of the left transverse process at T1, extending to the articular pillar.  We are asked to perform an image guided biopsy today.  He is NPO. No blood thinners.  Past Medical History:  Diagnosis Date  . Anxiety   . Dysrhythmia    past hx pvc  . H/O autologous stem cell transplant ([place])    may 2014  at [facility]  . History of Bell's palsy    2009  RIGHT SIDE--  HAS 80% FUNCTION / PT STATES A LITTLE ASYMETRICAL AND EFFECTS MOUTH  . Hodgkin's disease, nodular sclerosis, of inguinal region/lower limb ([place]) ONOLOGIST--  DR ENNEVER AND A [facility]     [place] CHEMO 2013/  AUTOLOGUS STEM CELL TRANSPLANT MAY 2014 AT [facility]  . Mass of right inguinal region   . PVC (premature ventricular contraction)    "benign"  . TIA (transient ischemic attack) 02/2015   "probable TIA"  . Wears contact lenses     Past Surgical History:  Procedure Laterality Date  . AXILLARY LYMPH NODE BIOPSY Left 02/02/2013   Procedure: NEEDLE LOCALIZED AXILLARY LYMPH NODE BIOPSY;  Surgeon: Haywood Lasso, MD;  Location: [place];  Service: General;  Laterality: Left;  . BONE MARROW BIOPSY  08/2012  . CYST REMOVAL NECK Left 1980  . LEFT INGUINAL LYMPH NODE BX  09-09-2012  . LYMPH NODE BIOPSY N/A 07/28/2015   Procedure: LYMPH NODE BIOPSY;  Surgeon: Melrose Nakayama,  MD;  Location: [place];  Service: Thoracic;  Laterality: N/A;  . NODE DISSECTION Right 07/28/2015   Procedure: NODE DISSECTION;  Surgeon: Melrose Nakayama, MD;  Location: [place];  Service: Thoracic;  Laterality: Right;  . PLEURA BIOPSY Left 05/31/2014  . REMOVAL RIGHT INGUINAL LYMPH NODES  08-16-2011  . SCROTAL EXPLORATION Right 01/04/2014   Procedure: SCROTUM EXPLORATION   INGUINAL , EXCISION OF CYSTIC MASS OF RIGHT SPERMATIC CORD, WITH FROZEN SECTION;  Surgeon: Franchot Gallo, MD;  Location: [facility];  Service: Urology;  Laterality: Right;  . TRANSTHORACIC ECHOCARDIOGRAM  03-18-2013   MILD LVH/  EF 55-60%  . VIDEO ASSISTED THORACOSCOPY Left 05/31/2014   Procedure: LEFT VIDEO ASSISTED THORACOSCOPY, PLEURAL BIOPSY;  Surgeon: Melrose Nakayama, MD;  Location: [place];  Service: Thoracic;  Laterality: Left;  Marland Kitchen VIDEO ASSISTED THORACOSCOPY Right 07/28/2015   Procedure: RIGHT VIDEO ASSISTED THORACOSCOPY;  Surgeon: Melrose Nakayama, MD;  Location: [place];  Service: Thoracic;  Laterality: Right;  Marland Kitchen VIDEO ASSISTED THORACOSCOPY (VATS)/ LYMPH NODE SAMPLING Right 07/28/2015  . VIDEO ASSISTED THORACOSCOPY (VATS)/WEDGE RESECTION  05/31/2014  . VIDEO BRONCHOSCOPY WITH ENDOBRONCHIAL ULTRASOUND  07/28/2015  . VIDEO BRONCHOSCOPY WITH ENDOBRONCHIAL ULTRASOUND N/A 07/28/2015   Procedure: VIDEO BRONCHOSCOPY WITH ENDOBRONCHIAL ULTRASOUND;  Surgeon: Melrose Nakayama, MD;  Location: [place];  Service: Thoracic;  Laterality: N/A;    Allergies: Patient has no  known allergies.  Medications: Prior to Admission medications   Medication Sig Start Date End Date Taking? Authorizing Provider  acetaminophen (TYLENOL) 325 MG tablet Take 325 mg by mouth every 6 (six) hours as needed for mild pain.    [provider]  aspirin 81 MG chewable tablet Chew 1 tablet (81 mg total) by mouth daily. 03/05/15   Thurnell Lose, MD  carbidopa-levodopa (SINEMET IR) 25-100 MG tablet Take 1 tablet by mouth 3 (three)  times daily. 07/09/17   Tat, Eustace Quail, DO  Cholecalciferol (VITAMIN D-3) 1000 UNITS CAPS Take 1 capsule by mouth daily.     [provider]  Multiple Vitamin (MULTIVITAMIN) tablet Take 1 tablet by mouth once a week.     [provider]  pramipexole (MIRAPEX) 1 MG tablet take 1 tablet by mouth three times a day 07/03/17   Tat, Eustace Quail, DO     Family History  Problem Relation Age of Onset  . Cancer Mother 67       ovarian  . Heart disease Father   . Stroke Father   . Cancer Sister        ovarian  . Cancer Brother        testicular    Social History   Social History  . Marital status: Legally Separated    Spouse name: N/A  . Number of children: N/A  . Years of education: N/A   Occupational History  . Arboriculturist Communication   Social History Main Topics  . Smoking status: Never Smoker  . Smokeless tobacco: Never Used  . Alcohol use 8.4 oz/week    7 Cans of beer, 7 Glasses of wine per week  . Drug use: No  . Sexual activity: Not Currently   Other Topics Concern  . None   Social History Narrative   Married. Education: [employer]. Exercise: jog/walk/ lift weights 7 days a week, 1-2 miles.    Review of Systems: A 12 point ROS discussed  Review of Systems  Constitutional: Negative.   HENT: Negative.   Respiratory: Negative.   Cardiovascular: Negative.   Gastrointestinal: Negative.   Genitourinary: Negative.   Musculoskeletal: Positive for gait problem.  Skin: Negative.   Neurological: Positive for tremors.  Hematological: Negative.   Psychiatric/Behavioral: Negative.     Vital Signs: BP (!) 144/94 (BP Location: Right Arm)   Pulse 80   Temp (!) 97.5 F (36.4 C) (Oral)   Ht 5' 10"  (1.778 m)   Wt 175 lb (79.4 kg)   SpO2 100%   BMI 25.11 kg/m   Physical Exam  Constitutional: He is oriented to person, place, and time. He appears well-developed.  HENT:  Head: Normocephalic and atraumatic.  Eyes: EOM are normal.  Neck: Normal range of  motion.  Cardiovascular: Normal rate, regular rhythm and normal heart sounds.   Pulmonary/Chest: Effort normal and breath sounds normal.  Abdominal: Soft.  Musculoskeletal: Normal range of motion.  Neurological: He is alert and oriented to person, place, and time.  Skin: Skin is warm and dry.  Psychiatric: He has a normal mood and affect. His behavior is normal. Judgment and thought content normal.    Imaging: Mr Thoracic Spine W Wo Contrast  Result Date: 09/06/2017 CLINICAL DATA:  Abnormality a T1 seen on PET CT EXAM: MRI THORACIC WITHOUT AND WITH CONTRAST TECHNIQUE: Multiplanar and multiecho pulse sequences of the thoracic spine were obtained without and with intravenous contrast. CONTRAST:  15m MULTIHANCE GADOBENATE DIMEGLUMINE 529 MG/ML IV SOLN COMPARISON:  PET CT 08/22/2017 FINDINGS: MRI THORACIC SPINE FINDINGS Alignment:  Physiologic. Vertebrae: There is a low T1/high T2 weighted signal lesion within the left transverse process of T1, corresponding to the hypermetabolic abnormality on the PET CT. This shows contrast enhancement on post gadolinium imaging. The lesion extends to the left T1 articular pillar. No other focal marrow lesions are identified. No compression fracture or other acute finding. Cord:  Normal signal and morphology. Paraspinal and other soft tissues: Negative. Disc levels: No disc herniation, spinal canal stenosis or nerve root impingement. IMPRESSION: 1. 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 2. No other osseous metastases of the thoracic spine. No epidural metastatic disease. 3. No spinal canal stenosis or neural impingement. Electronically Signed   By: Ulyses Jarred M.D.   On: 09/06/2017 19:00   Nm Pet Image Restag (ps) Skull Base To Thigh  Result Date: 08/22/2017 CLINICAL DATA:  Subsequent treatment strategy for Hodgkin's lymphoma. EXAM: NUCLEAR MEDICINE PET SKULL BASE TO THIGH TECHNIQUE: 9.3 mCi F-18 FDG was  injected intravenously. Full-ring PET imaging was performed from the skull base to thigh after the radiotracer. CT data was obtained and used for attenuation correction and anatomic localization. FASTING BLOOD GLUCOSE:  Value: 96 mg/dl COMPARISON:  PET-CT May 23, 2017 FINDINGS: NECK: There is increased uptake in the region of the right tonsillar fossa versus the left extending towards the right vallecula. There may be subtle mild soft tissue fullness in this region on CT images. The maximum SUV is 5.6. There is increased uptake in the region of the left T1 transverse process. No CT correlate identified in this region. No soft tissue nodule or fracture noted. The maximum SUV is 5.3. No other abnormal FDG uptake in the head or neck regions. The tiny nodule in the left thoracic inlet on the previous study is not visualized today. There is no abnormal uptake in this region. CHEST: The prevascular node described on the previous study is seen on series 4, image 59 today measuring 7 mm in short axis today versus 8 mm previously. There is no increased FDG uptake in this region. The maximum SUV is 1.4 today versus 4.6 previously. The right paratracheal node described previously is seen on series 4, image 56 today measuring 4 mm today versus 5 mm previously with no abnormal FDG uptake discernible from background. No other abnormal uptake seen in the chest. ABDOMEN/PELVIS: No abnormal hypermetabolic activity within the liver, pancreas, adrenal glands, or spleen. No hypermetabolic lymph nodes in the abdomen or pelvis. SKELETON: No focal hypermetabolic activity to suggest skeletal metastasis. IMPRESSION: 1. There is focal increased uptake in the region of the left T1 transverse process with no CT correlate. The level and focal nature of the uptake is concerning. Recommend attention on follow-up. If further evaluation is clinically necessary, an MRI could further evaluate this region for bony or soft tissue involvement. 2. There  is increased uptake in the right tonsillar fossa with subtle soft tissue fullness. This is a nonspecific finding and could represent asymmetric physiologic uptake. Recommend clinical correlation and attention on follow-up. 3. The previously identified nodes in the chest have returned to normal as above. 4. No other FDG avid disease. Electronically Signed   By: Dorise Bullion III M.D   On: 08/22/2017 14:03    Labs:  CBC: No results for input(s): WBC, HGB, HCT, PLT in the last 8760 hours.  COAGS: No results for input(s): INR, APTT in the last 8760  hours.  BMP:  Recent Labs  04/02/17  NA 142  K 5.1  BUN 21  CREATININE 1.1    LIVER FUNCTION TESTS:  Recent Labs  04/02/17  AST 20  ALT 16  ALKPHOS 79    TUMOR MARKERS: No results for input(s): AFPTM, CEA, CA199, CHROMGRNA in the last 8760 hours.  Assessment and Plan:  Hodgkin's lymphoma with a focal metastatic lesion of the left transverse process at T1, extending to the articular pillar.  Will proceed with image guided biopsy of left transverse process lesion by Dr. Pascal Lux.  Risks and benefits discussed with the patient including, but not limited to bleeding, infection, damage to adjacent structures or low yield requiring additional tests.  All of the patient's questions were answered, patient is agreeable to proceed. Consent signed and in chart.  Thank you for this interesting consult.  I greatly enjoyed meeting Brian [name] and look forward to participating in their care.  A copy of this report was sent to the requesting provider on this date.  Electronically Signed: Murrell Redden, PA-C 09/19/2017, 9:54 AM   I spent a total of  30 Minutes in face to face in clinical consultation, greater than 50% of which was counseling/coordinating care for image guided biopsy.

## 2017-09-19 NOTE — Discharge Instructions (Signed)
°Needle Biopsy of the Bone, Care After °Refer to this sheet in the next few weeks. These instructions provide you with information about caring for yourself after your procedure. Your health care provider may also give you more specific instructions. Your treatment has been planned according to current medical practices, but problems sometimes occur. Call your health care provider if you have any problems or questions after your procedure. °What can I expect after the procedure? °After your procedure, it is common to have soreness or tenderness at the puncture site. °Follow these instructions at home: °· Take over-the-counter and prescription medicines only as told by your health care provider. °· Bathe and shower as told by your health care provider. °· Follow instructions from your health care provider about: °? How to take care of your puncture site. °? When and how you should change your bandage (dressing). °? When you should remove your dressing. °· Check your puncture site every day for signs of infection. Watch for: °? Redness, swelling, or worsening pain. °? Fluid, blood, or pus. °· Return to your normal activities as told by your health care provider. °· Keep all follow-up visits as told by your health care provider. This is important. °Contact a health care provider if: °· You have redness, swelling, or worsening pain at the site of your puncture. °· You have fluid, blood, or pus coming from your puncture site. °· You have a fever. °· You have persistent nausea or vomiting. °Get help right away if: °· You develop a rash. °· You have difficulty breathing. °This information is not intended to replace advice given to you by your health care provider. Make sure you discuss any questions you have with your health care provider. °Document Released: 06/01/2005 Document Revised: 04/19/2016 Document Reviewed: 12/20/2014 °Elsevier Interactive Patient Education © 2018 [employer] °Moderate Conscious Sedation,  Adult, Care After °These instructions provide you with information about caring for yourself after your procedure. Your health care provider may also give you more specific instructions. Your treatment has been planned according to current medical practices, but problems sometimes occur. Call your health care provider if you have any problems or questions after your procedure. °What can I expect after the procedure? °After your procedure, it is common: °· To feel sleepy for several hours. °· To feel clumsy and have poor balance for several hours. °· To have poor judgment for several hours. °· To vomit if you eat too soon. ° °Follow these instructions at home: °For at least 24 hours after the procedure: ° °· Do not: °? Participate in activities where you could fall or become injured. °? Drive. °? Use heavy machinery. °? Drink alcohol. °? Take sleeping pills or medicines that cause drowsiness. °? Make important decisions or sign legal documents. °? Take care of children on your own. °· Rest. °Eating and drinking °· Follow the diet recommended by your health care provider. °· If you vomit: °? Drink water, juice, or soup when you can drink without vomiting. °? Make sure you have little or no nausea before eating solid foods. °General instructions °· Have a responsible adult stay with you until you are awake and alert. °· Take over-the-counter and prescription medicines only as told by your health care provider. °· If you smoke, do not smoke without supervision. °· Keep all follow-up visits as told by your health care provider. This is important. °Contact a health care provider if: °· You keep feeling nauseous or you keep vomiting. °· You feel light-headed. °·   You develop a rash. °· You have a fever. °Get help right away if: °· You have trouble breathing. °This information is not intended to replace advice given to you by your health care provider. Make sure you discuss any questions you have with your health care  provider. °Document Released: 09/02/2013 Document Revised: 04/16/2016 Document Reviewed: 03/03/2016 °Elsevier Interactive Patient Education © 2018 Elsevier Inc. ° °

## 2017-09-25 ENCOUNTER — Encounter: Payer: Self-pay | Admitting: Radiation Oncology

## 2017-09-26 NOTE — Progress Notes (Signed)
Lymphoma Location(s) / Histology: L T1 transverse process. - Recurrent Hodgkin's Disease   Brian [name] presented for MRI of thoracic spine on 09/06/17 which showed "focal metastatic lesion of the left transverse process at T1, extending to the articular pillar."  Biopsies revealed:   09/19/17 Diagnosis Bone, biopsy, L T1 - BONY TISSUE WITH LIMITED ATYPICAL LYMPHOID INFILTRATE, SEE COMMENT. Microscopic Comment The specimen is entirely submitted for histologic evaluation and shows bony tissue with bone remodeling. The medullary space is cellular but with prominent crush artifacts that hinder overall evaluation. In relatively small and intact areas, there is a polymorphous infiltrate composed of small lymphocytes, plasma cells, histiocytes and occasional eosinophils in addition to rare large mononuclear or lobated cells with small nucleoli as seen in background of fibrosis. Immunohistochemical stains were performed and show that the rare large cells are positive for CD30 and negative for CD15, CD20, CD3, and LCA. The small lymphocytes in the background show a mixture of T and B cells with predominance of T cells. Kappa and lambda in situ hybridization is non contributory. The findings are extremely limited but highly concerning for involvement by previously known classical Hodgkin lymphoma. Clinical correlation is strongly recommended. (BNS:ah/gt 09/24/17)  Past/Anticipated interventions by medical oncology, if any: no  Weight changes, if any, over the past 6 months: no  Recurrent fevers, or drenching night sweats, if any: occasional  SAFETY ISSUES:  Prior radiation? Yes mid thigh to under arm at [facility] 5/14  Pacemaker/ICD? no  Possible current pregnancy? no  Is the patient on methotrexate? no  Current Complaints / other details:    BP 126/74 (BP Location: Right Arm, Patient Position: Sitting)   Pulse 99   Temp 98.2 F (36.8 C) (Oral)   Ht 5\' 10"  (1.778 m)   Wt 183 lb 6.4 oz  (83.2 kg)   SpO2 100%   BMI 26.32 kg/m    Wt Readings from Last 3 Encounters:  10/02/17 183 lb 6.4 oz (83.2 kg)  09/19/17 175 lb (79.4 kg)  07/09/17 178 lb (80.7 kg)

## 2017-10-02 ENCOUNTER — Ambulatory Visit
Admission: RE | Admit: 2017-10-02 | Discharge: 2017-10-02 | Disposition: A | Discharge status: Home / Self Care | Payer: Commercial Managed Care - PPO | Source: Ambulatory Visit | Attending: Radiation Oncology | Admitting: Radiation Oncology

## 2017-10-02 ENCOUNTER — Encounter: Payer: Self-pay | Admitting: Radiation Oncology

## 2017-10-02 VITALS — BP 126/74 | HR 99 | Temp 98.2°F | Ht 70.0 in | Wt 183.4 lb

## 2017-10-02 DIAGNOSIS — C8195 Hodgkin lymphoma, unspecified, lymph nodes of inguinal region and lower limb: Secondary | ICD-10-CM | POA: Diagnosis not present

## 2017-10-02 DIAGNOSIS — F419 Anxiety disorder, unspecified: Secondary | ICD-10-CM | POA: Insufficient documentation

## 2017-10-02 DIAGNOSIS — G20 Parkinson's disease: Secondary | ICD-10-CM

## 2017-10-02 DIAGNOSIS — Z510 Encounter for antineoplastic radiation therapy: Secondary | ICD-10-CM | POA: Diagnosis present

## 2017-10-02 DIAGNOSIS — C7951 Secondary malignant neoplasm of bone: Secondary | ICD-10-CM | POA: Insufficient documentation

## 2017-10-02 DIAGNOSIS — Z9489 Other transplanted organ and tissue status: Secondary | ICD-10-CM | POA: Diagnosis not present

## 2017-10-02 NOTE — Progress Notes (Signed)
Radiation Oncology         [phone]) [phone] ________________________________  Initial Outpatient Consultation  Name: Brian [name] MRN: [id number]  Date: 10/02/2017  DOB: 1962/08/57  YO:VZCHYIF, [name], MD  [name], MD   REFERRING PHYSICIAN: [name], MD  DIAGNOSIS: Recurrent Hodgkin's Disease -  S/p autologous transplant    HISTORY OF PRESENT ILLNESS::Brian [name] is a 56 y.o. male who is here for evaluation of recurrent Hodgkin's Disease.  Of note, his most recent PET scan was on 08/22/2017 and revealed increased uptake to the region of the left T1 transverse process. He then had an MRI on 09/06/2017 showing a focal metastatic lesion of the left transverse process at T1, extending into the articular pillar. A bone biopsy was then completed on 09/19/2017 with results revealing bony tissue with limited atypical lymphoid infiltrate. Findings consistent with  Hodgkin's disease  Pt presents to the office today accompanied by his ex-wife. He reports that he is doing well overall. 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   On review of systems, he reports mild swelling and mild soreness around the biopsy site in his back that has mostly resolved. He denies any other pain, SOB. He reports being diagnosed with Parkinson's disease approximately one year ago. Parkinson's disease is been controlled well with medication.He denies smoking, smokeless tobacco or drug use. He reports about 8 ounces of ETOH use weekly.   PREVIOUS RADIATION THERAPY: Yes total lymphoid radiation therapy as part of his autologous transplant 18 Gy in 10 fractions twice daily for 5 days 1.8 Gy per fraction. 04/13/2013-04/17/2013 for stage III refractory Hodgkin lymphoma. [employer].  PAST MEDICAL HISTORY:  has a past medical history of Anxiety, Dysrhythmia, H/O autologous stem cell transplant ([place]), History of Bell's palsy, Hodgkin's disease, nodular sclerosis, of inguinal region/lower limb  ([place]) (ONOLOGIST--  DR Marin Olp AND [facility]), Mass of right inguinal region, PVC (premature ventricular contraction), TIA (transient ischemic attack) (02/2015), and Wears contact lenses.    PAST SURGICAL HISTORY: Past Surgical History:  Procedure Laterality Date  . BONE MARROW BIOPSY  08/2012  . CYST REMOVAL NECK Left 1980  . LEFT INGUINAL LYMPH NODE BX  09-09-2012  . PLEURA BIOPSY Left 05/31/2014  . REMOVAL RIGHT INGUINAL LYMPH NODES  08-16-2011  . TRANSTHORACIC ECHOCARDIOGRAM  03-18-2013   MILD LVH/  EF 55-60%  . VIDEO ASSISTED THORACOSCOPY (VATS)/ LYMPH NODE SAMPLING Right 07/28/2015  . VIDEO ASSISTED THORACOSCOPY (VATS)/WEDGE RESECTION  05/31/2014  . VIDEO BRONCHOSCOPY WITH ENDOBRONCHIAL ULTRASOUND  07/28/2015    FAMILY HISTORY: family history includes Cancer in his brother and sister; Cancer (age of onset: 3) in his mother; Heart disease in his father; Stroke in his father.  SOCIAL HISTORY:  reports that  has never smoked. he has never used smokeless tobacco. He reports that he drinks about 8.4 oz of alcohol per week. He reports that he does not use drugs.  ALLERGIES: Patient has no known allergies.  MEDICATIONS:  Current Outpatient Medications  Medication Sig Dispense Refill  . acetaminophen (TYLENOL) 325 MG tablet Take 325 mg by mouth every 6 (six) hours as needed for mild pain.    Marland Kitchen aspirin 81 MG chewable tablet Chew 1 tablet (81 mg total) by mouth daily. 30 tablet 0  . carbidopa-levodopa (SINEMET IR) 25-100 MG tablet Take 1 tablet by mouth 3 (three) times daily. 270 tablet 1  . Cholecalciferol (VITAMIN D-3) 1000 UNITS CAPS Take 1 capsule by mouth daily.     Marland Kitchen  ibuprofen (ADVIL,MOTRIN) 200 MG tablet Take 200 mg by mouth.    . Multiple Vitamin (MULTIVITAMIN) tablet Take 1 tablet by mouth once a week.     . pramipexole (MIRAPEX) 1 MG tablet take 1 tablet by mouth three times a day 270 tablet 0   No current facility-administered medications for this encounter.     REVIEW OF SYSTEMS:  A  10+ POINT REVIEW OF SYSTEMS WAS OBTAINED including neurology, dermatology, psychiatry, cardiac, respiratory, lymph, extremities, GI, GU, Musculoskeletal, constitutional, breasts, reproductive, HEENT.  All pertinent positives are noted in the HPI.  All others are negative.   PHYSICAL EXAM:  height is 5' 10"  (1.778 m) and weight is 183 lb 6.4 oz (83.2 kg). His oral temperature is 98.2 F (36.8 C). His blood pressure is 126/74 and his pulse is 99. His oxygen saturation is 100%.   General: Alert and oriented, in no acute distress HEENT: Head is normocephalic. Extraocular movements are intact. Oropharynx is clear. Mild right facial weakness from Bells Palsy. Neck: Neck is supple, no palpable cervical or supraclavicular lymphadenopathy. Heart: Regular in rate and rhythm with no murmurs, rubs, or gallops. Chest: Clear to auscultation bilaterally, with no rhonchi, wheezes, or rales. Small scars along bilateral chest areas from surgeries related to his Hodgkin's disease. Abdomen: Soft, nontender, nondistended, with no rigidity or guarding. Extremities: No cyanosis or edema. Lymphatics: see Neck Exam Skin: No concerning lesions. Musculoskeletal: symmetric strength and muscle tone throughout. Neurologic: Cranial nerves II through XII are grossly intact. No obvious focalities. Speech is fluent. Coordination is intact. Psychiatric: Judgment and insight are intact. Affect is appropriate.   ECOG = 1  LABORATORY DATA:  Lab Results  Component Value Date   WBC 5.1 09/19/2017   HGB 14.0 09/19/2017   HCT 42.8 09/19/2017   MCV 96.4 09/19/2017   PLT 233 09/19/2017   NEUTROABS 3.5 07/19/2016   Lab Results  Component Value Date   NA 142 04/02/2017   K 5.1 04/02/2017   CL 108 05/18/2016   CO2 26 07/19/2016   GLUCOSE 99 07/19/2016   CREATININE 1.1 04/02/2017   CALCIUM 9.8 07/19/2016      RADIOGRAPHY: Mr Thoracic Spine W Wo Contrast  Result Date: 09/06/2017 CLINICAL DATA:  Abnormality a T1 seen on  PET CT EXAM: MRI THORACIC WITHOUT AND WITH CONTRAST TECHNIQUE: Multiplanar and multiecho pulse sequences of the thoracic spine were obtained without and with intravenous contrast. CONTRAST:  82m MULTIHANCE GADOBENATE DIMEGLUMINE 529 MG/ML IV SOLN COMPARISON:  PET CT 08/22/2017 FINDINGS: MRI THORACIC SPINE FINDINGS Alignment:  Physiologic. Vertebrae: There is a low T1/high T2 weighted signal lesion within the left transverse process of T1, corresponding to the hypermetabolic abnormality on the PET CT. This shows contrast enhancement on post gadolinium imaging. The lesion extends to the left T1 articular pillar. No other focal marrow lesions are identified. No compression fracture or other acute finding. Cord:  Normal signal and morphology. Paraspinal and other soft tissues: Negative. Disc levels: No disc herniation, spinal canal stenosis or nerve root impingement. IMPRESSION: 1. 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 2. No other osseous metastases of the thoracic spine. No epidural metastatic disease. 3. No spinal canal stenosis or neural impingement. Electronically Signed   By: KUlyses JarredM.D.   On: 09/06/2017 19:00   Ct Biopsy  Addendum Date: 09/19/2017   ADDENDUM REPORT: 09/19/2017 17:02 ADDENDUM: EXAMINATION: CT-GUIDED BIOPSY OF HYPERMETABOLIC LESION INVOLVING THE LEFT T1 TRANSVERSE PROCESS. Electronically Signed  By: Sandi Mariscal M.D.   On: 09/19/2017 17:02   Result Date: 09/19/2017 INDICATION: History of lymphoma, now with hypermetabolic lesion involving the left T1 transverse process. Please perform CT-guided biopsy for tissue diagnostic purposes. EXAM: CT-GUIDED BONE MARROW BIOPSY AND ASPIRATION MEDICATIONS: None ANESTHESIA/SEDATION: Fentanyl 100 mcg IV; Versed 1.5 mg IV Sedation Time: 20 minutes; The patient was continuously monitored during the procedure by the interventional radiology nurse under my direct supervision. COMPLICATIONS:  None immediate. PROCEDURE: Informed consent was obtained from the patient following an explanation of the procedure, risks, benefits and alternatives. The patient understands, agrees and consents for the procedure. All questions were addressed. A time out was performed prior to the initiation of the procedure. The patient was positioned prone and non-contrast localization CT was performed of lower cervical and upper thoracic spine demonstrating the left T1 transverse process. The operative site was prepped and draped in the usual sterile fashion. Under sterile conditions and local anesthesia, a 22 gauge spinal needle was utilized for procedural planning. Next, an 11 gauge coaxial bone biopsy needle was advanced into the posterior aspect of the left T1 transverse process. Needle position was confirmed with CT imaging. Next, a bone biopsy was obtained with the 11 gauge outer bone marrow device. The needle was removed intact. Superficial hemostasis was obtained with manual compression. Postprocedural imaging was obtained was negative for evidence of complication, specifically, no developing hematoma. A dressing was placed. The patient tolerated the procedure well without immediate post procedural complication. IMPRESSION: Successful CT guided biopsy of the left T1 transverse process. Electronically Signed: By: Sandi Mariscal M.D. On: 09/19/2017 13:30      IMPRESSION: Recurrent Hodgkin's Disease -  S/p autologous transplant, recent treatment with CAR[facility]T cell therapy at [facility].  Recent imaging now consistent with the solitary metastasis along the left T1 transverse process.  This area was biopsied and consistent with Hodgkin's disease.  Brian [name] is a  56 y.o. male who presents today to discuss the role of radiotherapy in the ongoing management of his recurrent Hodgkin's lymphoma. The risks vs benefits, side effects, and potential toxicities were discussed at great length with the patient. I informed them that they  may experience possible sore throat, fatigue and radiation-related skin changes within their treatment field. The patient appears to understand and wishes to proceed with planned course of treatment. A consent form was signed and placed in the patient's medical record.    PLAN: Patient will proceed with CT simulation tomorrow 10/03/2017 at 3 PM. He will receive approximately 3-4 weeks of radiation therapy directed at the T1 spine area. Total dose may be adjusted in light of his previous total lymphoid irradiation.     ------------------------------------------------  Blair Promise, PhD, MD  This document serves as a record of services personally performed by Gery Pray, MD. It was created on her behalf by Marlowe Kays, a trained medical scribe. The creation of this record is based on the scribe's personal observations and the provider's statements to them. This document has been checked and approved by the attending provider.

## 2017-10-03 ENCOUNTER — Ambulatory Visit
Admission: RE | Admit: 2017-10-03 | Discharge: 2017-10-03 | Disposition: A | Discharge status: Home / Self Care | Payer: Commercial Managed Care - PPO | Source: Ambulatory Visit | Attending: Radiation Oncology | Admitting: Radiation Oncology

## 2017-10-03 DIAGNOSIS — C8195 Hodgkin lymphoma, unspecified, lymph nodes of inguinal region and lower limb: Secondary | ICD-10-CM | POA: Diagnosis not present

## 2017-10-03 DIAGNOSIS — C7951 Secondary malignant neoplasm of bone: Secondary | ICD-10-CM | POA: Diagnosis not present

## 2017-10-03 DIAGNOSIS — Z510 Encounter for antineoplastic radiation therapy: Secondary | ICD-10-CM | POA: Diagnosis not present

## 2017-10-07 DIAGNOSIS — Z510 Encounter for antineoplastic radiation therapy: Secondary | ICD-10-CM | POA: Diagnosis not present

## 2017-10-07 DIAGNOSIS — C8195 Hodgkin lymphoma, unspecified, lymph nodes of inguinal region and lower limb: Secondary | ICD-10-CM | POA: Diagnosis not present

## 2017-10-07 DIAGNOSIS — C7951 Secondary malignant neoplasm of bone: Secondary | ICD-10-CM | POA: Diagnosis not present

## 2017-10-07 NOTE — Progress Notes (Signed)
  Radiation Oncology         [phone]) [phone] ________________________________  Name: Brian [name] MRN: [id number]  Date: 10/03/2017  DOB: July 27, 1961  SIMULATION AND TREATMENT PLANNING NOTE    ICD-10-CM   1. Hodgkin's disease C81.95     DIAGNOSIS:  Recurrent Hodgkin's Disease - S/p autologous transplant  NARRATIVE:  The patient was brought to the [facility].  Identity was confirmed.  All relevant records and images related to the planned course of therapy were reviewed.  The patient freely provided informed written consent to proceed with treatment after reviewing the details related to the planned course of therapy. The consent form was witnessed and verified by the simulation staff.  Then, the patient was set-up in a stable reproducible  supine position for radiation therapy.  CT images were obtained.  Surface markings were placed.  The CT images were loaded into the planning software.  Then the target and avoidance structures were contoured.  Treatment planning then occurred.  The radiation prescription was entered and confirmed.  Then, I designed and supervised the construction of a total of 3 medically necessary complex treatment devices.  I have requested : 3D Simulation  I have requested a DVH of the following structures: CTV, spinal cord, lungs larynx.  I have ordered:dose calc.  PLAN:  The patient will receive 26 Gy in 13 fractions followed by a boost to the area of uptake on PET scan for 5 additional treatments and a boost dose of 10 gray. Cumulative dose to the area of involvement will be 36 gray. Patient has had previous radiation therapy as part of his autologous transplant to this region and therefore dose  to the spinal cord will be stopped after his first 13 treatments.    -----------------------------------  Blair Promise, PhD, MD

## 2017-10-08 ENCOUNTER — Ambulatory Visit
Admission: RE | Admit: 2017-10-08 | Discharge: 2017-10-08 | Disposition: A | Discharge status: Home / Self Care | Payer: Commercial Managed Care - PPO | Source: Ambulatory Visit | Attending: Radiation Oncology | Admitting: Radiation Oncology

## 2017-10-08 ENCOUNTER — Other Ambulatory Visit: Payer: Self-pay | Admitting: Neurology

## 2017-10-08 DIAGNOSIS — C8195 Hodgkin lymphoma, unspecified, lymph nodes of inguinal region and lower limb: Secondary | ICD-10-CM

## 2017-10-08 DIAGNOSIS — Z510 Encounter for antineoplastic radiation therapy: Secondary | ICD-10-CM | POA: Diagnosis not present

## 2017-10-08 DIAGNOSIS — C7951 Secondary malignant neoplasm of bone: Secondary | ICD-10-CM | POA: Diagnosis not present

## 2017-10-08 MED ORDER — PRAMIPEXOLE DIHYDROCHLORIDE 1 MG PO TABS: 1.0000 mg | ORAL_TABLET | Freq: Three times a day (TID) | ORAL | 0 refills | Status: DC

## 2017-10-08 MED ORDER — SONAFINE EX EMUL
1.0000 "application " | Freq: Once | CUTANEOUS | Status: AC
  Administered 2017-10-08: 1 via TOPICAL

## 2017-10-08 NOTE — Progress Notes (Signed)
Pt here for patient teaching.  Pt given Radiation and You booklet and Sonafine.  Reviewed areas of pertinence such as fatigue, skin changes and throat changes . Pt able to give teach back of to pat skin and use unscented/gentle soap,apply Sonafine bid and avoid applying anything to skin within 4 hours of treatment. Pt demonstrated understanding and verbalizes understanding of information given and will contact nursing with any questions or concerns.          

## 2017-10-08 NOTE — Progress Notes (Signed)
  Radiation Oncology         [phone] ________________________________  Name: Brian [name] MRN: [id number]  Date: 10/08/2017  DOB: Jul 16, 1961  Simulation Verification Note    ICD-10-CM   1. Hodgkin's disease C81.95     Status: outpatient  NARRATIVE: The patient was brought to the treatment unit and placed in the planned treatment position. The clinical setup was verified. Then port films were obtained and uploaded to the radiation oncology medical record software.  The treatment beams were carefully compared against the planned radiation fields. The position location and shape of the radiation fields was reviewed. They targeted volume of tissue appears to be appropriately covered by the radiation beams. Organs at risk appear to be excluded as planned.  Based on my personal review, I approved the simulation verification. The patient's treatment will proceed as planned.  -----------------------------------  Blair Promise, PhD, MD

## 2017-10-09 ENCOUNTER — Ambulatory Visit
Admission: RE | Admit: 2017-10-09 | Discharge: 2017-10-09 | Disposition: A | Discharge status: Home / Self Care | Payer: Commercial Managed Care - PPO | Source: Ambulatory Visit | Attending: Radiation Oncology | Admitting: Radiation Oncology

## 2017-10-09 DIAGNOSIS — C8195 Hodgkin lymphoma, unspecified, lymph nodes of inguinal region and lower limb: Secondary | ICD-10-CM | POA: Diagnosis not present

## 2017-10-09 DIAGNOSIS — C7951 Secondary malignant neoplasm of bone: Secondary | ICD-10-CM | POA: Diagnosis not present

## 2017-10-09 DIAGNOSIS — Z510 Encounter for antineoplastic radiation therapy: Secondary | ICD-10-CM | POA: Diagnosis not present

## 2017-10-10 ENCOUNTER — Ambulatory Visit
Admission: RE | Admit: 2017-10-10 | Discharge: 2017-10-10 | Disposition: A | Discharge status: Home / Self Care | Payer: Commercial Managed Care - PPO | Source: Ambulatory Visit | Attending: Radiation Oncology | Admitting: Radiation Oncology

## 2017-10-10 DIAGNOSIS — Z510 Encounter for antineoplastic radiation therapy: Secondary | ICD-10-CM | POA: Diagnosis not present

## 2017-10-10 DIAGNOSIS — C8195 Hodgkin lymphoma, unspecified, lymph nodes of inguinal region and lower limb: Secondary | ICD-10-CM | POA: Diagnosis not present

## 2017-10-10 DIAGNOSIS — C7951 Secondary malignant neoplasm of bone: Secondary | ICD-10-CM | POA: Diagnosis not present

## 2017-10-11 ENCOUNTER — Ambulatory Visit
Admission: RE | Admit: 2017-10-11 | Discharge: 2017-10-11 | Disposition: A | Discharge status: Home / Self Care | Payer: Commercial Managed Care - PPO | Source: Ambulatory Visit | Attending: Radiation Oncology | Admitting: Radiation Oncology

## 2017-10-11 DIAGNOSIS — C7951 Secondary malignant neoplasm of bone: Secondary | ICD-10-CM | POA: Diagnosis not present

## 2017-10-11 DIAGNOSIS — C8195 Hodgkin lymphoma, unspecified, lymph nodes of inguinal region and lower limb: Secondary | ICD-10-CM | POA: Diagnosis not present

## 2017-10-11 DIAGNOSIS — Z510 Encounter for antineoplastic radiation therapy: Secondary | ICD-10-CM | POA: Diagnosis not present

## 2017-10-13 ENCOUNTER — Ambulatory Visit
Admission: RE | Admit: 2017-10-13 | Discharge: 2017-10-13 | Disposition: A | Discharge status: Home / Self Care | Payer: Commercial Managed Care - PPO | Source: Ambulatory Visit | Attending: Radiation Oncology | Admitting: Radiation Oncology

## 2017-10-13 DIAGNOSIS — Z510 Encounter for antineoplastic radiation therapy: Secondary | ICD-10-CM | POA: Diagnosis not present

## 2017-10-13 DIAGNOSIS — C7951 Secondary malignant neoplasm of bone: Secondary | ICD-10-CM | POA: Diagnosis not present

## 2017-10-13 DIAGNOSIS — C8195 Hodgkin lymphoma, unspecified, lymph nodes of inguinal region and lower limb: Secondary | ICD-10-CM | POA: Diagnosis not present

## 2017-10-14 ENCOUNTER — Ambulatory Visit
Admission: RE | Admit: 2017-10-14 | Discharge: 2017-10-14 | Disposition: A | Discharge status: Home / Self Care | Payer: Commercial Managed Care - PPO | Source: Ambulatory Visit | Attending: Radiation Oncology | Admitting: Radiation Oncology

## 2017-10-14 DIAGNOSIS — C7951 Secondary malignant neoplasm of bone: Secondary | ICD-10-CM | POA: Diagnosis not present

## 2017-10-14 DIAGNOSIS — C8195 Hodgkin lymphoma, unspecified, lymph nodes of inguinal region and lower limb: Secondary | ICD-10-CM | POA: Diagnosis not present

## 2017-10-14 DIAGNOSIS — Z510 Encounter for antineoplastic radiation therapy: Secondary | ICD-10-CM | POA: Diagnosis not present

## 2017-10-15 ENCOUNTER — Ambulatory Visit
Admission: RE | Admit: 2017-10-15 | Discharge: 2017-10-15 | Disposition: A | Discharge status: Home / Self Care | Payer: Commercial Managed Care - PPO | Source: Ambulatory Visit | Attending: Radiation Oncology | Admitting: Radiation Oncology

## 2017-10-15 DIAGNOSIS — C8195 Hodgkin lymphoma, unspecified, lymph nodes of inguinal region and lower limb: Secondary | ICD-10-CM | POA: Diagnosis not present

## 2017-10-15 DIAGNOSIS — Z510 Encounter for antineoplastic radiation therapy: Secondary | ICD-10-CM | POA: Diagnosis not present

## 2017-10-15 DIAGNOSIS — C7951 Secondary malignant neoplasm of bone: Secondary | ICD-10-CM | POA: Diagnosis not present

## 2017-10-16 ENCOUNTER — Ambulatory Visit: Payer: Commercial Managed Care - PPO

## 2017-10-21 ENCOUNTER — Ambulatory Visit
Admission: RE | Admit: 2017-10-21 | Discharge: 2017-10-21 | Disposition: A | Discharge status: Home / Self Care | Payer: Commercial Managed Care - PPO | Source: Ambulatory Visit | Attending: Radiation Oncology | Admitting: Radiation Oncology

## 2017-10-21 DIAGNOSIS — C8195 Hodgkin lymphoma, unspecified, lymph nodes of inguinal region and lower limb: Secondary | ICD-10-CM | POA: Diagnosis not present

## 2017-10-21 DIAGNOSIS — Z510 Encounter for antineoplastic radiation therapy: Secondary | ICD-10-CM | POA: Diagnosis not present

## 2017-10-21 DIAGNOSIS — C7951 Secondary malignant neoplasm of bone: Secondary | ICD-10-CM | POA: Diagnosis not present

## 2017-10-22 ENCOUNTER — Ambulatory Visit
Admission: RE | Admit: 2017-10-22 | Discharge: 2017-10-22 | Disposition: A | Discharge status: Home / Self Care | Payer: Commercial Managed Care - PPO | Source: Ambulatory Visit | Attending: Radiation Oncology | Admitting: Radiation Oncology

## 2017-10-22 DIAGNOSIS — C8195 Hodgkin lymphoma, unspecified, lymph nodes of inguinal region and lower limb: Secondary | ICD-10-CM | POA: Diagnosis not present

## 2017-10-22 DIAGNOSIS — Z510 Encounter for antineoplastic radiation therapy: Secondary | ICD-10-CM | POA: Diagnosis not present

## 2017-10-22 DIAGNOSIS — C7951 Secondary malignant neoplasm of bone: Secondary | ICD-10-CM | POA: Diagnosis not present

## 2017-10-23 ENCOUNTER — Ambulatory Visit
Admission: RE | Admit: 2017-10-23 | Discharge: 2017-10-23 | Disposition: A | Discharge status: Home / Self Care | Payer: Commercial Managed Care - PPO | Source: Ambulatory Visit | Attending: Radiation Oncology | Admitting: Radiation Oncology

## 2017-10-23 DIAGNOSIS — C7951 Secondary malignant neoplasm of bone: Secondary | ICD-10-CM | POA: Diagnosis not present

## 2017-10-23 DIAGNOSIS — C8195 Hodgkin lymphoma, unspecified, lymph nodes of inguinal region and lower limb: Secondary | ICD-10-CM | POA: Diagnosis not present

## 2017-10-23 DIAGNOSIS — Z510 Encounter for antineoplastic radiation therapy: Secondary | ICD-10-CM | POA: Diagnosis not present

## 2017-10-24 ENCOUNTER — Ambulatory Visit
Admission: RE | Admit: 2017-10-24 | Discharge: 2017-10-24 | Disposition: A | Discharge status: Home / Self Care | Payer: Commercial Managed Care - PPO | Source: Ambulatory Visit | Attending: Radiation Oncology | Admitting: Radiation Oncology

## 2017-10-24 DIAGNOSIS — C8195 Hodgkin lymphoma, unspecified, lymph nodes of inguinal region and lower limb: Secondary | ICD-10-CM | POA: Diagnosis not present

## 2017-10-24 DIAGNOSIS — Z510 Encounter for antineoplastic radiation therapy: Secondary | ICD-10-CM | POA: Diagnosis not present

## 2017-10-24 DIAGNOSIS — C7951 Secondary malignant neoplasm of bone: Secondary | ICD-10-CM | POA: Diagnosis not present

## 2017-10-25 ENCOUNTER — Ambulatory Visit
Admission: RE | Admit: 2017-10-25 | Discharge: 2017-10-25 | Disposition: A | Discharge status: Home / Self Care | Payer: Commercial Managed Care - PPO | Source: Ambulatory Visit | Attending: Radiation Oncology | Admitting: Radiation Oncology

## 2017-10-25 DIAGNOSIS — C7951 Secondary malignant neoplasm of bone: Secondary | ICD-10-CM | POA: Diagnosis not present

## 2017-10-25 DIAGNOSIS — C8195 Hodgkin lymphoma, unspecified, lymph nodes of inguinal region and lower limb: Secondary | ICD-10-CM | POA: Diagnosis not present

## 2017-10-25 DIAGNOSIS — Z510 Encounter for antineoplastic radiation therapy: Secondary | ICD-10-CM | POA: Diagnosis not present

## 2017-10-28 ENCOUNTER — Ambulatory Visit
Admission: RE | Admit: 2017-10-28 | Discharge: 2017-10-28 | Disposition: A | Discharge status: Home / Self Care | Payer: Commercial Managed Care - PPO | Source: Ambulatory Visit | Attending: Radiation Oncology | Admitting: Radiation Oncology

## 2017-10-28 DIAGNOSIS — C7951 Secondary malignant neoplasm of bone: Secondary | ICD-10-CM | POA: Diagnosis not present

## 2017-10-28 DIAGNOSIS — C8195 Hodgkin lymphoma, unspecified, lymph nodes of inguinal region and lower limb: Secondary | ICD-10-CM | POA: Diagnosis not present

## 2017-10-28 DIAGNOSIS — Z510 Encounter for antineoplastic radiation therapy: Secondary | ICD-10-CM | POA: Diagnosis not present

## 2017-10-29 ENCOUNTER — Ambulatory Visit
Admission: RE | Admit: 2017-10-29 | Discharge: 2017-10-29 | Disposition: A | Discharge status: Home / Self Care | Payer: Commercial Managed Care - PPO | Source: Ambulatory Visit | Attending: Radiation Oncology | Admitting: Radiation Oncology

## 2017-10-29 DIAGNOSIS — Z510 Encounter for antineoplastic radiation therapy: Secondary | ICD-10-CM | POA: Diagnosis not present

## 2017-10-29 DIAGNOSIS — C7951 Secondary malignant neoplasm of bone: Secondary | ICD-10-CM | POA: Diagnosis not present

## 2017-10-29 DIAGNOSIS — C8195 Hodgkin lymphoma, unspecified, lymph nodes of inguinal region and lower limb: Secondary | ICD-10-CM | POA: Diagnosis not present

## 2017-10-30 ENCOUNTER — Ambulatory Visit
Admission: RE | Admit: 2017-10-30 | Discharge: 2017-10-30 | Disposition: A | Discharge status: Home / Self Care | Payer: Commercial Managed Care - PPO | Source: Ambulatory Visit | Attending: Radiation Oncology | Admitting: Radiation Oncology

## 2017-10-30 DIAGNOSIS — Z510 Encounter for antineoplastic radiation therapy: Secondary | ICD-10-CM | POA: Diagnosis not present

## 2017-10-30 DIAGNOSIS — C7951 Secondary malignant neoplasm of bone: Secondary | ICD-10-CM | POA: Diagnosis not present

## 2017-10-30 DIAGNOSIS — C8195 Hodgkin lymphoma, unspecified, lymph nodes of inguinal region and lower limb: Secondary | ICD-10-CM | POA: Diagnosis not present

## 2017-10-31 ENCOUNTER — Ambulatory Visit
Admission: RE | Admit: 2017-10-31 | Discharge: 2017-10-31 | Disposition: A | Discharge status: Home / Self Care | Payer: Commercial Managed Care - PPO | Source: Ambulatory Visit | Attending: Radiation Oncology | Admitting: Radiation Oncology

## 2017-10-31 DIAGNOSIS — C7951 Secondary malignant neoplasm of bone: Secondary | ICD-10-CM | POA: Diagnosis not present

## 2017-10-31 DIAGNOSIS — C8195 Hodgkin lymphoma, unspecified, lymph nodes of inguinal region and lower limb: Secondary | ICD-10-CM | POA: Diagnosis not present

## 2017-10-31 DIAGNOSIS — Z510 Encounter for antineoplastic radiation therapy: Secondary | ICD-10-CM | POA: Diagnosis not present

## 2017-11-01 ENCOUNTER — Ambulatory Visit
Admission: RE | Admit: 2017-11-01 | Discharge: 2017-11-01 | Disposition: A | Discharge status: Home / Self Care | Payer: Commercial Managed Care - PPO | Source: Ambulatory Visit | Attending: Radiation Oncology | Admitting: Radiation Oncology

## 2017-11-01 DIAGNOSIS — C7951 Secondary malignant neoplasm of bone: Secondary | ICD-10-CM | POA: Diagnosis not present

## 2017-11-01 DIAGNOSIS — C8195 Hodgkin lymphoma, unspecified, lymph nodes of inguinal region and lower limb: Secondary | ICD-10-CM | POA: Diagnosis not present

## 2017-11-01 DIAGNOSIS — Z510 Encounter for antineoplastic radiation therapy: Secondary | ICD-10-CM | POA: Diagnosis not present

## 2017-11-04 ENCOUNTER — Ambulatory Visit: Payer: Commercial Managed Care - PPO

## 2017-11-05 ENCOUNTER — Encounter: Payer: Self-pay | Admitting: Radiation Oncology

## 2017-11-05 ENCOUNTER — Ambulatory Visit
Admission: RE | Admit: 2017-11-05 | Discharge: 2017-11-05 | Disposition: A | Discharge status: Home / Self Care | Payer: Commercial Managed Care - PPO | Source: Ambulatory Visit | Attending: Radiation Oncology | Admitting: Radiation Oncology

## 2017-11-05 DIAGNOSIS — C8195 Hodgkin lymphoma, unspecified, lymph nodes of inguinal region and lower limb: Secondary | ICD-10-CM | POA: Diagnosis not present

## 2017-11-05 DIAGNOSIS — C7951 Secondary malignant neoplasm of bone: Secondary | ICD-10-CM | POA: Diagnosis not present

## 2017-11-05 DIAGNOSIS — Z510 Encounter for antineoplastic radiation therapy: Secondary | ICD-10-CM | POA: Diagnosis not present

## 2017-11-05 NOTE — Progress Notes (Signed)
Brian [name] was seen today in the movement disorders clinic for neurologic consultation at the request of Copland, [name], MD.  The consultation is for the evaluation of parkinsonism.  I have reviewed numerous records made available to me.  The patient previously saw Dr. [name] and subsequently Dr. [name].  He saw Dr. [name] when he was in the hospital in April, 2016.  That was for an event in which he experienced 1-2 minutes of speech and word finding trouble.  Dr. [name] doubted that was a TIA.  He had an EEG that was negative.  He had an MRI of the brain on 03/04/2015 without gadolinium that I had the opportunity to review.  There were a very few scattered T2 hyperintensities, but diffusion-weighted imaging was negative.  MRSA was negative.  He followed up with Dr. [name] one time on an outpatient basis, but nothing further was revealed.  Today, the patient presents because of shuffling of the feet for about 8 months.  Pt states that it started dragging of the L foot but now seems to be dragging both.  He has also noted handwriting and voice changes and has become concerned about the possibility of Parkinson's disease.  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  He also has a history of chemotherapy-induced peripheral neuropathy (Adcetris).  He asks about "psychogenic stuff."  States that he has been through divorce along with his cancer.  Asks if his sx's could be from stress as he has been reading about psychogenic manifestations of sx's.  08/22/16 update: The patient follows up today for testing results.  This patient is accompanied in the office by his ex wife who supplements the history.  He had a DaT scan done on 08/16/16 and showed abnormal grade 3 uptake, virtually absent bilaterally in both putamen and caudate nuclei.    He has not had any falls since last visit.  Continues to have difficulty with picking up the feet.  He denies lightheadedness or near syncope.  He denies hallucinations.  He continues to have issues with depression.  No SI/HI.  Ex wife concerned about mood.  Also noting stiffness, some drooling, constipation.    11/27/16 update: The patient follows up today, accompanied by his ex-wife (and friend) who supplements the history.  The patient was started on pramipexole last visit and worked up to 0.5 mg 3 times per day.  He denies any compulsive behaviors or sleep attacks.  He states that he is doing better but not as good as he thought that he would.  He is shuffling some.  He is exercising with his bike.  He has been only to 2 physical therapy sessions.  He declined speech and occupational therapy, despite the fact that the therapist recommended them as did I.  He has not had any falls.  Denies lightheadedness or syncope.  He continues to struggle with depression but states that mood has been better.  Last visit, he was given names of psychiatrist but refused to go, stating that he did not need a psychiatrist and had a Social worker.  He called me for a prescription for Xanax, but this is not a medication that I generally recommended my Parkinson's patients.  03/05/17 update:  Patient follows up today.  He is unaccompanied today.   The  patient is on pramipexole but we increased that last visit to 1.0 mg tid.  No falls but some near falls.  He is doing rock steady one day a week.  There've been no compulsive behaviors.  No sleep attacks.  No hallucinations.  No lightheadedness or near syncope.  He was seen last by the bone marrow transplant team at [facility] in January, 2018.  I have reviewed those records.  He is currently disease-free from a lymphoma standpoint.  07/09/17 update:  Pt f/u today for PD.  The records that were made available to me were reviewed since last visit.  He had a repeat PET scan at [facility] that  was also interpreted at [facility].  There were hypermetabolic nodes in the mediastinal region.  Findings were concerning for recurrence of lymphoma.  Pt states that the plan is to repeat it in September.  In progress to Parkinson's disease, the patient is still on pramipexole 1.0 mg tid.  He is feeling like he is always walking on the balls of the feet and describing a festinating gait.  He has some feeling of retropulsion.  No compulsive behaviors.  No falls.  No lightheadedness/near syncope.  He is exercising on his bike a few times per week.  He is "profusely sweating."  Did fall out of bed onto the nightstand while asleep.  11/07/17 update:  Pt seen in f/u for PD.  The records that were made available to me were reviewed.  Had a PET scan demonstrating hypermetabolic lesion at T1.  Had MRI thoracic spine that confirmed focal metastatic disease.  Ultimately had a bx to confirm it was return of lymphoma and bx was suggestive of such.  Has been receiving focal radiation to that area and finished that on tuesday.  In regards to PD he is on pramipexole 1.0 mg tid and last visit we started carbidopa/levodopa 25/100 tid.  He wanted a 2nd opinion at [facility] interdisciplinary clinic.  It appears that this didn't happen (don't think that referral got placed).  Pt states that he decided he actually didn't want to go.  He is doing much better with the addition of carbidopa/levodopa so he didn't feel the need to go.  He is doing 5-6 miles on his bike a few times a week.  Pt denies falls.  Pt denies lightheadedness, near syncope.  No hallucinations.  Mood has been good.  ALLERGIES:  No Known Allergies  CURRENT MEDICATIONS:  Outpatient Encounter Medications as of 11/07/2017  Medication Sig  . acetaminophen (TYLENOL) 325 MG tablet Take 325 mg by mouth every 6 (six) hours as needed for mild pain.  Marland Kitchen aspirin 81 MG chewable tablet Chew 1 tablet (81 mg total) by mouth daily.  . carbidopa-levodopa (SINEMET IR) 25-100 MG tablet Take  1 tablet by mouth 3 (three) times daily.  . Cholecalciferol (VITAMIN D-3) 1000 UNITS CAPS Take 1 capsule by mouth daily.   Marland Kitchen ibuprofen (ADVIL,MOTRIN) 200 MG tablet Take 200 mg by mouth.  . Multiple Vitamin (MULTIVITAMIN) tablet Take 1 tablet by mouth once a week.   . pramipexole (MIRAPEX) 1 MG tablet Take 1 tablet (1 mg total) 3 (three) times daily by mouth.  . Wound Dressings (SONAFINE EX) Apply topically.   No facility-administered encounter medications on file as of 11/07/2017.     PAST MEDICAL HISTORY:   Past Medical History:  Diagnosis Date  . Anxiety   . Dysrhythmia    past hx pvc  . H/O autologous stem cell transplant ([place])  may 2014  at [facility]  . History of Bell's palsy    2009  RIGHT SIDE--  HAS 80% FUNCTION / PT STATES A LITTLE ASYMETRICAL AND EFFECTS MOUTH  . Hodgkin's disease, nodular sclerosis, of inguinal region/lower limb ([place]) ONOLOGIST--  DR [facility] AND A [facility]     [place] CHEMO 2013/  AUTOLOGUS STEM CELL TRANSPLANT MAY 2014 AT [facility]  . Mass of right inguinal region   . PVC (premature ventricular contraction)    "benign"  . TIA (transient ischemic attack) 02/2015   "probable TIA"  . Wears contact lenses     PAST SURGICAL HISTORY:   Past Surgical History:  Procedure Laterality Date  . AXILLARY LYMPH NODE BIOPSY Left 02/02/2013   Procedure: NEEDLE LOCALIZED AXILLARY LYMPH NODE BIOPSY;  Surgeon: Haywood Lasso, MD;  Location: [place];  Service: General;  Laterality: Left;  . BONE MARROW BIOPSY  08/2012  . CYST REMOVAL NECK Left 1980  . LEFT INGUINAL LYMPH NODE BX  09-09-2012  . LYMPH NODE BIOPSY N/A 07/28/2015   Procedure: LYMPH NODE BIOPSY;  Surgeon: Melrose Nakayama, MD;  Location: [place];  Service: Thoracic;  Laterality: N/A;  . NODE DISSECTION Right 07/28/2015   Procedure: NODE DISSECTION;  Surgeon: Melrose Nakayama, MD;  Location: [place];  Service: Thoracic;  Laterality: Right;  . PLEURA BIOPSY Left 05/31/2014  . REMOVAL RIGHT INGUINAL  LYMPH NODES  08-16-2011  . SCROTAL EXPLORATION Right 01/04/2014   Procedure: SCROTUM EXPLORATION   INGUINAL , EXCISION OF CYSTIC MASS OF RIGHT SPERMATIC CORD, WITH FROZEN SECTION;  Surgeon: Franchot Gallo, MD;  Location: [facility];  Service: Urology;  Laterality: Right;  . TRANSTHORACIC ECHOCARDIOGRAM  03-18-2013   MILD LVH/  EF 55-60%  . VIDEO ASSISTED THORACOSCOPY Left 05/31/2014   Procedure: LEFT VIDEO ASSISTED THORACOSCOPY, PLEURAL BIOPSY;  Surgeon: Melrose Nakayama, MD;  Location: [place];  Service: Thoracic;  Laterality: Left;  Marland Kitchen VIDEO ASSISTED THORACOSCOPY Right 07/28/2015   Procedure: RIGHT VIDEO ASSISTED THORACOSCOPY;  Surgeon: Melrose Nakayama, MD;  Location: [place];  Service: Thoracic;  Laterality: Right;  Marland Kitchen VIDEO ASSISTED THORACOSCOPY (VATS)/ LYMPH NODE SAMPLING Right 07/28/2015  . VIDEO ASSISTED THORACOSCOPY (VATS)/WEDGE RESECTION  05/31/2014  . VIDEO BRONCHOSCOPY WITH ENDOBRONCHIAL ULTRASOUND  07/28/2015  . VIDEO BRONCHOSCOPY WITH ENDOBRONCHIAL ULTRASOUND N/A 07/28/2015   Procedure: VIDEO BRONCHOSCOPY WITH ENDOBRONCHIAL ULTRASOUND;  Surgeon: Melrose Nakayama, MD;  Location: [place];  Service: Thoracic;  Laterality: N/A;    SOCIAL HISTORY:   Social History   Socioeconomic History  . Marital status: Legally Separated    Spouse name: Not on file  . Number of children: Not on file  . Years of education: Not on file  . Highest education level: Not on file  Social Needs  . Financial resource strain: Not on file  . Food insecurity - worry: Not on file  . Food insecurity - inability: Not on file  . Transportation needs - medical: Not on file  . Transportation needs - non-medical: Not on file  Occupational History  . Occupation: Oncologist: [employer]  Tobacco Use  . Smoking status: Never Smoker  . Smokeless tobacco: Never Used  Substance and Sexual Activity  . Alcohol use: Yes    Alcohol/week: 8.4 oz    Types: 7 Cans of beer, 7 Glasses of  wine per week  . Drug use: No  . Sexual activity: Not Currently  Other Topics Concern  . Not  on file  Social History Narrative   Married. Education: [employer]. Exercise: jog/walk/ lift weights 7 days a week, 1-2 miles.    FAMILY HISTORY:   Family Status  Relation Name Status  . Mother  Deceased  . Father  Deceased  . Sister  Alive  . Brother 3 Alive  . Son 2 Alive    ROS:  Feet and toe paresthesias with neuropathy.  A complete 10 system review of systems was obtained and was unremarkable apart from what is mentioned above.  PHYSICAL EXAMINATION:    VITALS:   Vitals:   11/07/17 1529  BP: (!) 140/92  Pulse: 92  SpO2: 98%  Weight: 186 lb (84.4 kg)  Height: '5\' 10"'$  (1.778 m)     Wt Readings from Last 3 Encounters:  11/07/17 186 lb (84.4 kg)  10/02/17 183 lb 6.4 oz (83.2 kg)  09/19/17 175 lb (79.4 kg)   Temp Readings from Last 3 Encounters:  10/02/17 98.2 F (36.8 C) (Oral)  09/19/17 (!) 97.5 F (36.4 C) (Oral)  06/06/17 98.2 F (36.8 C) (Oral)   BP Readings from Last 3 Encounters:  11/07/17 (!) 140/92  10/02/17 126/74  09/19/17 119/76   Pulse Readings from Last 3 Encounters:  11/07/17 92  10/02/17 99  09/19/17 88     GEN:  The patient appears stated age and is in NAD.  Joking and laughing today HEENT:  Normocephalic, atraumatic.  The mucous membranes are moist. The superficial temporal arteries are without ropiness or tenderness. CV:    Regular rate and rhythm. Lungs:  CTAB Neck/HEME:  There are no carotid bruits bilaterally.  Neurological examination:  Orientation: The patient is alert and oriented x3. Fund of knowledge is appropriate.  Cranial nerves: There is slight right facial droop, only noticeable because of decreased nasolabial fold on the right.  He is able to symmetrically activate the muscles of facial expression.  He does have abnormal synkinetic reinnervation of the muscles of facial expression on the right.  He does not have a Bell's  phenomenon.  There is facial hypomimia.    The speech is fluent and clear.  He is hypophonic but less so than previous.  Soft palate rises symmetrically and there is no tongue deviation. Hearing is intact to conversational tone. Sensation: Sensation is intact to light throughout.   Movement examination: Tone: There is mild LUE rigidity (improved) Abnormal movements: None Coordination:  There is no decremation with any form of RAMS, including alternating supination and pronation of the forearm, hand opening and closing, finger taps, heel taps and toe taps. Gait and Station: The patient has no difficulty arising out of a deep-seated chair without the use of the hands. The patient's stride length is normal, although he does slightly drag the L leg.  Negative pull test.  Able to run down the hallway without trouble.  ASSESSMENT/PLAN:  1.   Parkinson's disease.  -The patient had a DaT scan on 08/16/2016 that showed abnormal grade 3 uptake, virtually absent bilaterally in both putamen and caudate nuclei.   -We discussed the diagnosis as well as pathophysiology of the disease.  We discussed treatment options as well as prognostic indicators.  Patient education was provided.  -We decided to continue pramipexole 1 mg tid.  We discussed risks, benefits, and side effects.  We discussed risk of compulsive behavior and sleep attacks.  Understanding was expressed.  -continue carbidopa/levodopa 25/100 tid.  Risks, benefits, side effects and alternative therapies were discussed, including but limited to dyskinesia.  The opportunity to ask questions was given and they were answered to the best of my ability.  The patient expressed understanding and willingness to follow the outlined treatment protocols.  -Talked about goals with exercise, particularly goals with biking (90 RPM)  2.  Chemo (adcetris) induced peripheral neuropathy  -The patient has only mild clinical examination evidence of a diffuse peripheral  neuropathy, and I do not think this can explain all of his symptoms.  I think it does explain his paresthesias that he has had since chemotherapy, but I am not sure it can explain his gait changes, and definitely cannot explain his hyperreflexia, which would be unusual in a neuropathy patient.  He may have always been hyperreflexic.  He refuses MRI cervical spine due to high deductible plan.   3.  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  4.  Hodgkins lymphoma, s/p autologous transplant with recurrence post transplant  -now with focal metastatic lesion to T1.  Just finished focal radiation.  5.  2016 transient (1-2 min) of word/speech trouble  -reviewed hospital records and agree that this likely did not represent TIA and if anything, more consistent with seizure.  In any case, the event has not recurred.  6.  Depression, moderate, recurrent  -Mood is actually much better than it was when initially diagnosed with Parkinson's disease.  This is even despite the fact that he has had a recurrence of lymphoma.  7.  RBD  -No further episodes of falling out of the bed.  Does not 1 clonazepam.  8. Follow up is anticipated in the next few months, sooner should new neurologic issues arise.  Much greater than 50% of this visit was spent in counseling and coordinating care.  Total face to face time:  25 min

## 2017-11-06 NOTE — Progress Notes (Signed)
  Radiation Oncology         [phone] ________________________________  Name: Brian [name] MRN: [id number]  Date: 11/05/2017  DOB: 09-10-1961  End of Treatment Note  Diagnosis: Recurrent Hodgkin's Disease - S/p autologous transplant, biopsy proven recurrence in the T1 left transverse process    Indication for treatment:  Curative       Radiation treatment dates:   10/18/17 - 11/05/17  Site/dose:   Spine T1 // 26 Gy in 13 fx   Spine boost // 10 Gy in 5 fx  Beams/energy:   Photon // 3D  Narrative: The patient tolerated radiation treatment relatively well. Patient denies having pain, nausea, fatigue, shortness of breath or dysphasia. Patient states mild esophagitis when swallowing, reports it to be a stinging feeling. Patient has slight redness to the back, and has slight cough, he reports that it is due to sinuses. Patient did develop some laryngitis which was felt to be related to upper respiratory illness.  The patient's boost field was off the spinal cord. He had previously received total body radiation therapy as part of his autologous transplant, therefore limiting his initial course of treatment to 26 Gy.  Plan: The patient has completed radiation treatment. The patient will return to radiation oncology clinic for routine followup in one month. I advised them to call or return sooner if they have any questions or concerns related to their recovery or treatment.  -----------------------------------  Blair Promise, PhD, MD  This document serves as a record of services personally performed by Gery Pray MD. It was created on his behalf by Delton Coombes, a trained medical scribe. The creation of this record is based on the scribe's personal observations and the provider's statements to them.

## 2017-11-07 ENCOUNTER — Encounter: Payer: Self-pay | Admitting: Neurology

## 2017-11-07 ENCOUNTER — Ambulatory Visit (INDEPENDENT_AMBULATORY_CARE_PROVIDER_SITE_OTHER): Payer: Commercial Managed Care - PPO | Admitting: Neurology

## 2017-11-07 VITALS — BP 140/92 | HR 92 | Ht 70.0 in | Wt 186.0 lb

## 2017-11-07 DIAGNOSIS — G20A1 Parkinson's disease without dyskinesia, without mention of fluctuations: Secondary | ICD-10-CM

## 2017-11-07 DIAGNOSIS — G4752 REM sleep behavior disorder: Secondary | ICD-10-CM | POA: Diagnosis not present

## 2017-11-07 DIAGNOSIS — G20 Parkinson's disease: Secondary | ICD-10-CM

## 2017-11-07 NOTE — Patient Instructions (Signed)
You look great!  Continue your medication.  Merry Christmas!

## 2017-12-05 ENCOUNTER — Encounter: Payer: Self-pay | Admitting: Hematology & Oncology

## 2017-12-06 ENCOUNTER — Encounter: Payer: Self-pay | Admitting: Oncology

## 2017-12-09 ENCOUNTER — Ambulatory Visit
Admission: RE | Admit: 2017-12-09 | Discharge: 2017-12-09 | Disposition: A | Discharge status: Home / Self Care | Payer: Commercial Managed Care - PPO | Source: Ambulatory Visit | Attending: Radiation Oncology | Admitting: Radiation Oncology

## 2017-12-09 ENCOUNTER — Encounter: Payer: Self-pay | Admitting: Radiation Oncology

## 2017-12-09 ENCOUNTER — Other Ambulatory Visit: Payer: Self-pay

## 2017-12-09 DIAGNOSIS — Z923 Personal history of irradiation: Secondary | ICD-10-CM | POA: Diagnosis not present

## 2017-12-09 DIAGNOSIS — Z791 Long term (current) use of non-steroidal anti-inflammatories (NSAID): Secondary | ICD-10-CM | POA: Diagnosis not present

## 2017-12-09 DIAGNOSIS — L819 Disorder of pigmentation, unspecified: Secondary | ICD-10-CM | POA: Insufficient documentation

## 2017-12-09 DIAGNOSIS — C8195 Hodgkin lymphoma, unspecified, lymph nodes of inguinal region and lower limb: Secondary | ICD-10-CM | POA: Diagnosis present

## 2017-12-09 DIAGNOSIS — Z7982 Long term (current) use of aspirin: Secondary | ICD-10-CM | POA: Diagnosis not present

## 2017-12-09 DIAGNOSIS — Y842 Radiological procedure and radiotherapy as the cause of abnormal reaction of the patient, or of later complication, without mention of misadventure at the time of the procedure: Secondary | ICD-10-CM | POA: Insufficient documentation

## 2017-12-09 DIAGNOSIS — Z79899 Other long term (current) drug therapy: Secondary | ICD-10-CM | POA: Diagnosis not present

## 2017-12-09 NOTE — Progress Notes (Addendum)
Radiation Oncology         [phone] ________________________________  Name: Brian [name] MRN: [id number]  Date: 12/09/2017  DOB: May 26, 1961  Follow-Up Visit Note  CC: Copland, [name], [name]  [name], [name]    ICD-10-CM   1. Hodgkin's disease of inguinal region or lower limb (HCC) C81.95     Diagnosis:   57 y.o. gentleman with recurrent Hodgkin's Disease - S/p autologous transplant, biopsy proven recurrence in the T1 left transverse process.   Interval Since Last Radiation:  1 months 10/18/17 - 11/05/17; 26 Gy in 13 fractions directed to the Spine T1. Followed by left transverse process boost, 10 Gy in 5 fractions.  Narrative:  The patient returns today for routine follow-up.  He denies having pain or fatigue.  He said his throat is occasionally sore but does not keep him from eating.  He also mentioned that his voice seems to fade after he has been talking for a while. Voice overall is much stronger.  He has slight hyperpigmentation on his upper back, neck area.                             ALLERGIES:  has No Known Allergies.  Meds: Current Outpatient Medications  Medication Sig Dispense Refill  . acetaminophen (TYLENOL) 325 MG tablet Take 325 mg by mouth every 6 (six) hours as needed for mild pain.    Marland Kitchen aspirin 81 MG chewable tablet Chew 1 tablet (81 mg total) by mouth daily. 30 tablet 0  . carbidopa-levodopa (SINEMET IR) 25-100 MG tablet Take 1 tablet by mouth 3 (three) times daily. 270 tablet 1  . Cholecalciferol (VITAMIN D-3) 1000 UNITS CAPS Take 1 capsule by mouth daily.     Marland Kitchen ibuprofen (ADVIL,MOTRIN) 200 MG tablet Take 200 mg by mouth.    . Multiple Vitamin (MULTIVITAMIN) tablet Take 1 tablet by mouth once a week.     . pramipexole (MIRAPEX) 1 MG tablet Take 1 tablet (1 mg total) 3 (three) times daily by mouth. 270 tablet 0   No current facility-administered medications for this encounter.     Physical Findings: The patient is in no acute distress. Patient is  alert and oriented.  height is 5\' 10"  (1.778 m) and weight is 186 lb 6.4 oz (84.6 kg). His oral temperature is 98.1 F (36.7 C). His blood pressure is 129/77 and his pulse is 88. His oxygen saturation is 100%. .  No significant changes.  Lungs are clear to auscultation bilaterally. Heart has regular rate and rhythm. No palpable cervical, supraclavicular, or axillary adenopathy. Abdomen soft, non-tender, normal bowel sounds. Mild skin changes in left lower neck/upper back region.   Lab Findings: Lab Results  Component Value Date   WBC 5.1 09/19/2017   HGB 14.0 09/19/2017   HCT 42.8 09/19/2017   MCV 96.4 09/19/2017   PLT 233 09/19/2017    Radiographic Findings: No results found.  Impression:  The patient is recovering from the effects of radiation. There is no evidence of reoccurrence in the clinical exam.  Plan:  PRN follow up with Radiation oncology. Patient will  follow up with Dr. Marin Olp later this month.. Patient will likely have a PET scan in mid- March (3 months out from radiation therapy)  _________________________________  Blair Promise, PhD, [name]  This document serves as a record of services personally performed by Brian [name]. It was created on his behalf by Delton Coombes,  a trained medical scribe. The creation of this record is based on the scribe's personal observations and the provider's statements to them.

## 2017-12-09 NOTE — Progress Notes (Signed)
Aryan is here for follow up.  He denies having pain or fatigue.  He said his throat is occasionally sore but does not keep him from eating.  He also mentioned that his voice seems to fade after he has been talking for a while.  He has slight hyperpigmentation on his upper back, neck area.  BP 129/77 (BP Location: Right Arm, Patient Position: Sitting)   Pulse 88   Temp 98.1 F (36.7 C) (Oral)   Ht 5\' 10"  (1.778 m)   Wt 186 lb 6.4 oz (84.6 kg)   SpO2 100%   BMI 26.75 kg/m    Wt Readings from Last 3 Encounters:  12/09/17 186 lb 6.4 oz (84.6 kg)  11/07/17 186 lb (84.4 kg)  10/02/17 183 lb 6.4 oz (83.2 kg)

## 2017-12-20 ENCOUNTER — Other Ambulatory Visit: Payer: Self-pay

## 2017-12-20 ENCOUNTER — Encounter: Payer: Self-pay | Admitting: Hematology & Oncology

## 2017-12-20 ENCOUNTER — Inpatient Hospital Stay: Payer: Commercial Managed Care - PPO | Attending: Hematology & Oncology | Admitting: Hematology & Oncology

## 2017-12-20 VITALS — BP 107/70 | HR 105 | Temp 98.4°F | Wt 184.0 lb

## 2017-12-20 DIAGNOSIS — G20 Parkinson's disease: Secondary | ICD-10-CM | POA: Insufficient documentation

## 2017-12-20 DIAGNOSIS — Z923 Personal history of irradiation: Secondary | ICD-10-CM

## 2017-12-20 DIAGNOSIS — Z79899 Other long term (current) drug therapy: Secondary | ICD-10-CM | POA: Insufficient documentation

## 2017-12-20 DIAGNOSIS — Z9484 Stem cells transplant status: Secondary | ICD-10-CM | POA: Diagnosis not present

## 2017-12-20 DIAGNOSIS — Z8673 Personal history of transient ischemic attack (TIA), and cerebral infarction without residual deficits: Secondary | ICD-10-CM | POA: Insufficient documentation

## 2017-12-20 DIAGNOSIS — C8190 Hodgkin lymphoma, unspecified, unspecified site: Secondary | ICD-10-CM

## 2017-12-20 DIAGNOSIS — C8195 Hodgkin lymphoma, unspecified, lymph nodes of inguinal region and lower limb: Secondary | ICD-10-CM

## 2017-12-20 DIAGNOSIS — Z7982 Long term (current) use of aspirin: Secondary | ICD-10-CM | POA: Insufficient documentation

## 2017-12-20 NOTE — Progress Notes (Signed)
Hematology and Oncology Follow Up Visit  Brian [name] [id number] [birth year] 57 y.o. 12/20/2017   Principle Diagnosis:  Recurrent Hodgkin's Disease -  S/p autologous transplant  TIA-resolved  Current Therapy:   S/p CAR-T cell therapy - [facility] on 03/06/2016     Interim History:  [name] is in for follow-up.  He actually looks pretty good.  His Parkinson's is getting a little bit better.  He sees neurology for this.  For his recent relapse of Hodgkin's disease, he underwent radiation therapy to the thoracic spine.  He underwent 26 Gy in 13 fractions directed to T1.  He then had a boost of 10 Gy in 5 fractions.  He completed this in December, on 11/05/2017.  He tolerated radiation quite nicely.  He is working.  He is exercising more.  He is eating without nausea or vomiting.  He had a little bit of odynophagia with the radiation but this did not really affect his swallowing.  He has had no fever.  He has had no change in bowel or bladder habits.  He has had no leg swelling.  He has had no bleeding.  Overall, I say his performance status is ECOG 1.    Medications:  Current Outpatient Medications:  .  acetaminophen (TYLENOL) 325 MG tablet, Take 325 mg by mouth every 6 (six) hours as needed for mild pain., Disp: , Rfl:  .  aspirin 81 MG chewable tablet, Chew 1 tablet (81 mg total) by mouth daily., Disp: 30 tablet, Rfl: 0 .  carbidopa-levodopa (SINEMET IR) 25-100 MG tablet, Take 1 tablet by mouth 3 (three) times daily., Disp: 270 tablet, Rfl: 1 .  Cholecalciferol (VITAMIN D-3) 1000 UNITS CAPS, Take 1 capsule by mouth daily. , Disp: , Rfl:  .  ibuprofen (ADVIL,MOTRIN) 200 MG tablet, Take 200 mg by mouth., Disp: , Rfl:  .  Multiple Vitamin (MULTIVITAMIN) tablet, Take 1 tablet by mouth once a week. , Disp: , Rfl:  .  pramipexole (MIRAPEX) 1 MG tablet, Take 1 tablet (1 mg total) 3 (three) times daily by mouth., Disp: 270 tablet, Rfl: 0  Allergies: No Known Allergies  Past Medical History,  Surgical history, Social history, and Family History were reviewed and updated.  Review of Systems: Review of Systems  Neurological: Positive for tremors.  All other systems reviewed and are negative.    Physical Exam:  weight is 184 lb (83.5 kg). His oral temperature is 98.4 F (36.9 C). His blood pressure is 107/70 and his pulse is 105 (abnormal). His oxygen saturation is 97%.   Physical Exam  Constitutional: He is oriented to person, place, and time.  HENT:  Head: Normocephalic and atraumatic.  Mouth/Throat: Oropharynx is clear and moist.  Eyes: EOM are normal. Pupils are equal, round, and reactive to light.  Neck: Normal range of motion.  Cardiovascular: Normal rate, regular rhythm and normal heart sounds.  Pulmonary/Chest: Effort normal and breath sounds normal.  Abdominal: Soft. Bowel sounds are normal.  Musculoskeletal: Normal range of motion. He exhibits no edema, tenderness or deformity.  Lymphadenopathy:    He has no cervical adenopathy.  Neurological: He is alert and oriented to person, place, and time.  Skin: Skin is warm and dry. No rash noted. No erythema.  Psychiatric: He has a normal mood and affect. His behavior is normal. Judgment and thought content normal.  Vitals reviewed.   Lab Results  Component Value Date   WBC 5.1 09/19/2017   HGB 14.0 09/19/2017   HCT  42.8 09/19/2017   MCV 96.4 09/19/2017   PLT 233 09/19/2017     Chemistry      Component Value Date/Time   NA 142 04/02/2017   NA 140 07/19/2016 1305   K 5.1 04/02/2017   K 4.4 07/19/2016 1305   CL 108 05/18/2016 1335   CL 105 02/08/2016 1117   CL 107 03/25/2013 0828   CO2 26 07/19/2016 1305   BUN 21 04/02/2017   BUN 22.8 07/19/2016 1305   CREATININE 1.1 04/02/2017   CREATININE 1.1 07/19/2016 1305   GLU 105 04/02/2017      Component Value Date/Time   CALCIUM 9.8 07/19/2016 1305   ALKPHOS 79 04/02/2017   ALKPHOS 76 07/19/2016 1305   AST 20 04/02/2017   AST 18 07/19/2016 1305   ALT 16  04/02/2017   ALT 14 07/19/2016 1305   BILITOT 0.70 07/19/2016 1305         Impression and Plan: Brian [name] is a 57 year old gentleman.  He has history of recurrent Hodgkin's disease. 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 He then had a recurrence after this.  He did undergo CAR-T therapy at [facility].  He did well with this.  Unfortunate, he had another relapse in his spine.  This was proven by biopsy.  He completed radiation therapy.  We will now see how the radiation is helped.  Hopefully, it will get him into a remission and keep him there for a while.  We will plan on a PET scan in early March.  That way, it will be 3 months after completion of his radiation therapy.  I just have a feeling though that his Hodgkin's will come back again.  It is proven to be quite resilient.  I wish I had a good explanation for this.  I will plan to see him back I spent about an hour with him. I just really feel bad for all that he is going through and has been through. To have Hodgkin's disease come back after his CAR-T cell therapy would really be a shoot problem.   We will see him back if his physicians at [facility] feel that is necessary for him to see [place]. For the most part, they are in charge.    [name], MD 1/25/20192:26 PM

## 2018-01-08 ENCOUNTER — Encounter: Payer: Self-pay | Admitting: Neurology

## 2018-01-13 ENCOUNTER — Other Ambulatory Visit: Payer: Self-pay | Admitting: Neurology

## 2018-01-13 MED ORDER — PRAMIPEXOLE DIHYDROCHLORIDE 1 MG PO TABS: 1.0000 mg | ORAL_TABLET | Freq: Three times a day (TID) | ORAL | 0 refills | Status: DC

## 2018-01-14 NOTE — Progress Notes (Addendum)
[place] [facility] [address], [place], [place] [address] [phone]  Date:  01/16/2018   Name:  Brian [name]   DOB:  [birth year]   MRN:  [id number]  PCP:  Darreld Mclean, MD    Chief Complaint: Annual Exam (Pt here for CPE pt is fasting for labs. )   History of Present Illness:  Brian [name] is a 57 y.o. very pleasant male patient who presents with the following:  Following up today/ CPE History of PD, Hodgkin's' disease  He underwent radiation again for a recurrence of his Hodgkin's recently- he did 5 days a week for 4 weeks, to his upper spine. He finished radiation in December, Dr. Sondra Come is his had onc doctor He is no longer doing immunotherapy at [facility]- they see him just once a year   Last seen by En[place] on 1/25:  Interim History:  Mr.  Bonnin is in for follow-up.  He actually looks pretty good.  His Parkinson's is getting a little bit better.  He sees neurology for this. For his recent relapse of Hodgkin's disease, he underwent radiation therapy to the thoracic spine.  He underwent 26 Gy in 13 fractions directed to T1.  He then had a boost of 10 Gy in 5 fractions.  He completed this in December, on 11/05/2017. He tolerated radiation quite nicely. He is working.  He is exercising more.  He is eating without nausea or vomiting.  He had a little bit of odynophagia with the radiation but this did not really affect his swallowing. He has had no fever.  He has had no change in bowel or bladder habits.  He has had no leg swelling.  He has had no bleeding.   He sees Dr. Carles Collet for his neurology care- from her note from December: 1.   Parkinson's disease.             -The patient had a DaT scan on 08/16/2016 that showed abnormal grade 3 uptake, virtually absent bilaterally in both putamen and caudate nuclei.              -We discussed the diagnosis as well as pathophysiology of the disease.  We discussed treatment options as  well as prognostic indicators.  Patient education was provided.             -We decided to continue pramipexole 1 mg tid.  We discussed risks, benefits, and side effects.  We discussed risk of compulsive behavior and sleep attacks.  Understanding was expressed.             -continue carbidopa/levodopa 25/100 tid.  Risks, benefits, side effects and alternative therapies were discussed, including but limited to dyskinesia.  The opportunity to ask questions was given and they were answered to the best of my ability.  The patient expressed understanding and willingness to follow the outlined treatment protocols.             -Talked about goals with exercise, particularly goals with biking (90 RPM)  Could use PSA, lipids, hep C screening  He feels like his gait is pretty good-his medications for PD do help with this He is riding a stationary bike for exercise- he would like to get back to running but he has too much numbness in his feet. This makes it hard to run, but he is doing the best that he can  His mood is pretty good He  has started dating again recently following his divorce.  This is going well and he is glad to have a GF again He and his ex have a good relationship. His kids are 25 and 9 yo.  Both boys, the eldest is at [facility] [employer] state.    He does not feel like he needs any medication for depression at this time He is working full time, he has to miss some time of course due to illness.   He is fasting today for labs  Patient Active Problem List   Diagnosis Date Noted  . PD (Parkinson's disease) ([place]) 11/27/2016  . Lymphadenopathy 07/28/2015  . Mediastinal adenopathy 07/14/2015  . Aphasia 03/04/2015  . Slurred speech 03/04/2015  . Pleural mass 05/31/2014  . Abdominal pain, epigastric 02/02/2014  . Weight loss 08/12/2012  . H/O Bell's palsy 08/12/2012  . Hodgkin's disease 08/09/2011    Past Medical History:  Diagnosis Date  . Anxiety   . Dysrhythmia    past hx pvc  . H/O autologous  stem cell transplant ([place])    may 2014  at [facility]  . History of Bell's palsy    2009  RIGHT SIDE--  HAS 80% FUNCTION / PT STATES A LITTLE ASYMETRICAL AND EFFECTS MOUTH  . History of radiation therapy 10/18/17-11/05/17   sprine T1 26 Gy in 13 fractions, spine boost 10 Gy in 5 fractions  . Hodgkin's disease, nodular sclerosis, of inguinal region/lower limb ([place]) ONOLOGIST--  DR [facility]NEVER AND A [facility]     SALVAGE CHEMO 2013/  AUTOLOGUS STEM CELL TRANSPLANT MAY 2014 AT [facility]  . Mass of right inguinal region   . PVC (premature ventricular contraction)    "benign"  . TIA (transient ischemic attack) 02/2015   "probable TIA"  . Wears contact lenses     Past Surgical History:  Procedure Laterality Date  . AXILLARY LYMPH NODE BIOPSY Left 02/02/2013   Procedure: NEEDLE LOCALIZED AXILLARY LYMPH NODE BIOPSY;  Surgeon: Haywood Lasso, MD;  Location: [facility];  Service: General;  Laterality: Left;  . BONE MARROW BIOPSY  08/2012  . CYST REMOVAL NECK Left 1980  . LEFT INGUINAL LYMPH NODE BX  09-09-2012  . LYMPH NODE BIOPSY N/A 07/28/2015   Procedure: LYMPH NODE BIOPSY;  Surgeon: Melrose Nakayama, MD;  Location: [facility];  Service: Thoracic;  Laterality: N/A;  . NODE DISSECTION Right 07/28/2015   Procedure: NODE DISSECTION;  Surgeon: Melrose Nakayama, MD;  Location: [facility];  Service: Thoracic;  Laterality: Right;  . PLEURA BIOPSY Left 05/31/2014  . REMOVAL RIGHT INGUINAL LYMPH NODES  08-16-2011  . SCROTAL EXPLORATION Right 01/04/2014   Procedure: SCROTUM EXPLORATION   INGUINAL , EXCISION OF CYSTIC MASS OF RIGHT SPERMATIC CORD, WITH FROZEN SECTION;  Surgeon: Franchot Gallo, MD;  Location: [facility];  Service: Urology;  Laterality: Right;  . TRANSTHORACIC ECHOCARDIOGRAM  03-18-2013   MILD LVH/  EF 55-60%  . VIDEO ASSISTED THORACOSCOPY Left 05/31/2014   Procedure: LEFT VIDEO ASSISTED THORACOSCOPY, PLEURAL BIOPSY;  Surgeon: Melrose Nakayama, MD;  Location: [facility];  Service:  Thoracic;  Laterality: Left;  Marland Kitchen VIDEO ASSISTED THORACOSCOPY Right 07/28/2015   Procedure: RIGHT VIDEO ASSISTED THORACOSCOPY;  Surgeon: Melrose Nakayama, MD;  Location: [facility];  Service: Thoracic;  Laterality: Right;  Marland Kitchen VIDEO ASSISTED THORACOSCOPY (VATS)/ LYMPH NODE SAMPLING Right 07/28/2015  . VIDEO ASSISTED THORACOSCOPY (VATS)/WEDGE RESECTION  05/31/2014  . VIDEO BRONCHOSCOPY WITH ENDOBRONCHIAL ULTRASOUND  07/28/2015  . VIDEO BRONCHOSCOPY WITH ENDOBRONCHIAL ULTRASOUND  N/A 07/28/2015   Procedure: VIDEO BRONCHOSCOPY WITH ENDOBRONCHIAL ULTRASOUND;  Surgeon: Melrose Nakayama, MD;  Location: [facility];  Service: Thoracic;  Laterality: N/A;    Social History   Tobacco Use  . Smoking status: Never Smoker  . Smokeless tobacco: Never Used  Substance Use Topics  . Alcohol use: Yes    Alcohol/week: 8.4 oz    Types: 7 Cans of beer, 7 Glasses of wine per week  . Drug use: No    Family History  Problem Relation Age of Onset  . Cancer Mother 32       ovarian  . Heart disease Father   . Stroke Father   . Cancer Sister        ovarian  . Cancer Brother        testicular    No Known Allergies  Medication list has been reviewed and updated.  Current Outpatient Medications on File Prior to Visit  Medication Sig Dispense Refill  . acetaminophen (TYLENOL) 325 MG tablet Take 325 mg by mouth every 6 (six) hours as needed for mild pain.    Marland Kitchen aspirin 81 MG chewable tablet Chew 1 tablet (81 mg total) by mouth daily. 30 tablet 0  . carbidopa-levodopa (SINEMET IR) 25-100 MG tablet Take 1 tablet by mouth 3 (three) times daily. 270 tablet 1  . Cholecalciferol (VITAMIN D-3) 1000 UNITS CAPS Take 1 capsule by mouth daily.     Marland Kitchen ibuprofen (ADVIL,MOTRIN) 200 MG tablet Take 200 mg by mouth.    . Multiple Vitamin (MULTIVITAMIN) tablet Take 1 tablet by mouth once a week.     . pramipexole (MIRAPEX) 1 MG tablet Take 1 tablet (1 mg total) by mouth 3 (three) times daily. 270 tablet 0   No current  facility-administered medications on file prior to visit.     Review of Systems:  As per HPI- otherwise negative. No fever or chills No CP or SOB He has gained a little weight  Physical Examination: Vitals:   01/16/18 1430  BP: 140/90  Pulse: 68  Temp: 98 F (36.7 C)  SpO2: 97%   Vitals:   01/16/18 1430  Weight: 186 lb 12.8 oz (84.7 kg)  Height: _0  (1.778 m)   Body mass index is 26.8 kg/m. Ideal Body Weight: Weight in (lb) to have BMI = 25: 173.9  GEN: WDWN, NAD, Non-toxic, A & O x 3 HEENT: Atraumatic, Normocephalic. Neck supple. No masses, No LAD. Ears and Nose: No external deformity. CV: RRR, No M/G/R. No JVD. No thrill. No extra heart sounds. PULM: CTA B, no wheezes, crackles, rhonchi. No retractions. No resp. distress. No accessory muscle use. ABD: S, NT, ND, +BS. No rebound. No HSM. EXTR: No c/c/e NEURO Normal gait.  PSYCH: Normally interactive. Conversant. Not depressed or anxious appearing.  Calm demeanor.  Looks well, fit build  Assessment and Plan: Physical exam - Plan: CBC, Comprehensive metabolic panel  Encounter for hepatitis C screening test for low risk patient - Plan: Hepatitis C antibody  Screening for prostate cancer - Plan: PSA  Screening for hyperlipidemia - Plan: Lipid panel  Parkinson's disease ([place])  Hodgkin lymphoma of intrathoracic lymph nodes, unspecified Hodgkin lymphoma type ([place])  CPE today Labs pending as above He has been through a lot with his chronic health problems but has persevered and is overall doing well right now See patient instructions for more details.     Signed Lamar Blinks, MD  Received his labs 2/22, message to pt  Hepatitis  C screening is negative as expected Metabolic profile and blood counts look good Cholesterol is overall good- I would suggest adding an omega 3 supplement to your regimen to help raise your HDL and improve your overall cholesterol ratio.    Lab Results      Component                 Value               Date                      PSA                      2.89                01/16/2018                PSA                      0.98                08/24/2013                PSA                      1.97                08/12/2012           Your PSA is in normal range, but it has gone up a good bit since the last time we checked it in 2014.  I would like to check it again in about 3 months to look for any trend.   I will order a PSA for you- please come in for a lab visit only to recheck this number.  No sex for 48 hours prior to the test  Take care, let me know if any questions.     Results for orders placed or performed in visit on 01/16/18  PSA  Result Value Ref Range   PSA 2.89 0.10 - 4.00 ng/mL  Hepatitis C antibody  Result Value Ref Range   Hepatitis C Ab NON-REACTIVE NON-REACTI   SIGNAL TO CUT-OFF 0.00 <1.00  Lipid panel  Result Value Ref Range   Cholesterol 185 0 - 200 mg/dL   Triglycerides 75.0 0.0 - 149.0 mg/dL   HDL 45.10 >39.00 mg/dL   VLDL 15.0 0.0 - 40.0 mg/dL   LDL Cholesterol 125 (H) 0 - 99 mg/dL   Total CHOL/HDL Ratio 4    NonHDL 139.97   CBC  Result Value Ref Range   WBC 4.3 4.0 - 10.5 K/uL   RBC 4.52 4.22 - 5.81 Mil/uL   Platelets 248.0 150.0 - 400.0 K/uL   Hemoglobin 14.5 13.0 - 17.0 g/dL   HCT 43.3 39.0 - 52.0 %   MCV 95.7 78.0 - 100.0 fl   MCHC 33.6 30.0 - 36.0 g/dL   RDW 13.5 11.5 - 15.5 %  Comprehensive metabolic panel  Result Value Ref Range   Sodium 140 135 - 145 mEq/L   Potassium 4.9 3.5 - 5.1 mEq/L   Chloride 103 96 - 112 mEq/L   CO2 28 19 - 32 mEq/L   Glucose, Bld 91 70 - 99 mg/dL   BUN 24 (H) 6 - 23 mg/dL   Creatinine, Ser 1.15 0.40 - 1.50 mg/dL   Total Bilirubin 0.7 0.2 - 1.2 mg/dL  Alkaline Phosphatase 79 39 - 117 U/L   AST 16 0 - 37 U/L   ALT 5 0 - 53 U/L   Total Protein 6.8 6.0 - 8.3 g/dL   Albumin 4.5 3.5 - 5.2 g/dL   Calcium 9.9 8.4 - 10.5 mg/dL   GFR 69.55 >60.00 mL/min

## 2018-01-16 ENCOUNTER — Ambulatory Visit (INDEPENDENT_AMBULATORY_CARE_PROVIDER_SITE_OTHER): Payer: Commercial Managed Care - PPO | Admitting: Family Medicine

## 2018-01-16 ENCOUNTER — Encounter: Payer: Self-pay | Admitting: Family Medicine

## 2018-01-16 VITALS — BP 140/90 | HR 68 | Temp 98.0°F | Ht 70.0 in | Wt 186.8 lb

## 2018-01-16 DIAGNOSIS — G20 Parkinson's disease: Secondary | ICD-10-CM | POA: Diagnosis not present

## 2018-01-16 DIAGNOSIS — Z1159 Encounter for screening for other viral diseases: Secondary | ICD-10-CM | POA: Diagnosis not present

## 2018-01-16 DIAGNOSIS — C8192 Hodgkin lymphoma, unspecified, intrathoracic lymph nodes: Secondary | ICD-10-CM | POA: Diagnosis not present

## 2018-01-16 DIAGNOSIS — R972 Elevated prostate specific antigen [PSA]: Secondary | ICD-10-CM | POA: Diagnosis not present

## 2018-01-16 DIAGNOSIS — Z13220 Encounter for screening for lipoid disorders: Secondary | ICD-10-CM | POA: Diagnosis not present

## 2018-01-16 DIAGNOSIS — Z125 Encounter for screening for malignant neoplasm of prostate: Secondary | ICD-10-CM | POA: Diagnosis not present

## 2018-01-16 DIAGNOSIS — Z0000 Encounter for general adult medical examination without abnormal findings: Secondary | ICD-10-CM | POA: Diagnosis not present

## 2018-01-16 NOTE — Patient Instructions (Signed)
Good to see you again today- I will be in touch with your labs asap Let us know if you need anything.  Otherwise we can plan to see you in about one year for a physical Do keep an eye on your blood pressure and let me know if any concerns    Health Maintenance, Male A healthy lifestyle and preventive care is important for your health and wellness. Ask your health care provider about what schedule of regular examinations is right for you. What should I know about weight and diet? Eat a Healthy Diet  Eat plenty of vegetables, fruits, whole grains, low-fat dairy products, and lean protein.  Do not eat a lot of foods high in solid fats, added sugars, or salt.  Maintain a Healthy Weight Regular exercise can help you achieve or maintain a healthy weight. You should:  Do at least 150 minutes of exercise each week. The exercise should increase your heart rate and make you sweat (moderate-intensity exercise).  Do strength-training exercises at least twice a week.  Watch Your Levels of Cholesterol and Blood Lipids  Have your blood tested for lipids and cholesterol every 5 years starting at 58 years of age. If you are at high risk for heart disease, you should start having your blood tested when you are 58 years old. You may need to have your cholesterol levels checked more often if: ? Your lipid or cholesterol levels are high. ? You are older than 58 years of age. ? You are at high risk for heart disease.  What should I know about cancer screening? Many types of cancers can be detected early and may often be prevented. Lung Cancer  You should be screened every year for lung cancer if: ? You are a current smoker who has smoked for at least 30 years. ? You are a former smoker who has quit within the past 15 years.  Talk to your health care provider about your screening options, when you should start screening, and how often you should be screened.  Colorectal Cancer  Routine colorectal  cancer screening usually begins at 58 years of age and should be repeated every 5-10 years until you are 58 years old. You may need to be screened more often if early forms of precancerous polyps or small growths are found. Your health care provider may recommend screening at an earlier age if you have risk factors for colon cancer.  Your health care provider may recommend using home test kits to check for hidden blood in the stool.  A small camera at the end of a tube can be used to examine your colon (sigmoidoscopy or colonoscopy). This checks for the earliest forms of colorectal cancer.  Prostate and Testicular Cancer  Depending on your age and overall health, your health care provider may do certain tests to screen for prostate and testicular cancer.  Talk to your health care provider about any symptoms or concerns you have about testicular or prostate cancer.  Skin Cancer  Check your skin from head to toe regularly.  Tell your health care provider about any new moles or changes in moles, especially if: ? There is a change in a mole's size, shape, or color. ? You have a mole that is larger than a pencil eraser.  Always use sunscreen. Apply sunscreen liberally and repeat throughout the day.  Protect yourself by wearing long sleeves, pants, a wide-brimmed hat, and sunglasses when outside.  What should I know about heart disease, diabetes,  and high blood pressure?  If you are 28-34 years of age, have your blood pressure checked every 3-5 years. If you are 35 years of age or older, have your blood pressure checked every year. You should have your blood pressure measured twice-once when you are at a hospital or clinic, and once when you are not at a hospital or clinic. Record the average of the two measurements. To check your blood pressure when you are not at a hospital or clinic, you can use: ? An automated blood pressure machine at a pharmacy. ? A home blood pressure monitor.  Talk to  your health care provider about your target blood pressure.  If you are between 35-49 years old, ask your health care provider if you should take aspirin to prevent heart disease.  Have regular diabetes screenings by checking your fasting blood sugar level. ? If you are at a normal weight and have a low risk for diabetes, have this test once every three years after the age of 76. ? If you are overweight and have a high risk for diabetes, consider being tested at a younger age or more often.  A one-time screening for abdominal aortic aneurysm (AAA) by ultrasound is recommended for men aged 37-75 years who are current or former smokers. What should I know about preventing infection? Hepatitis B If you have a higher risk for hepatitis B, you should be screened for this virus. Talk with your health care provider to find out if you are at risk for hepatitis B infection. Hepatitis C Blood testing is recommended for:  Everyone born from [birth year] through 1966.  Anyone with known risk factors for hepatitis C.  Sexually Transmitted Diseases (STDs)  You should be screened each year for STDs including gonorrhea and chlamydia if: ? You are sexually active and are younger than 58 years of age. ? You are older than 58 years of age and your health care provider tells you that you are at risk for this type of infection. ? Your sexual activity has changed since you were last screened and you are at an increased risk for chlamydia or gonorrhea. Ask your health care provider if you are at risk.  Talk with your health care provider about whether you are at high risk of being infected with HIV. Your health care provider may recommend a prescription medicine to help prevent HIV infection.  What else can I do?  Schedule regular health, dental, and eye exams.  Stay current with your vaccines (immunizations).  Do not use any tobacco products, such as cigarettes, chewing tobacco, and e-cigarettes. If you need  help quitting, ask your health care provider.  Limit alcohol intake to no more than 2 drinks per day. One drink equals 12 ounces of beer, 5 ounces of wine, or 1 ounces of hard liquor.  Do not use street drugs.  Do not share needles.  Ask your health care provider for help if you need support or information about quitting drugs.  Tell your health care provider if you often feel depressed.  Tell your health care provider if you have ever been abused or do not feel safe at home. This information is not intended to replace advice given to you by your health care provider. Make sure you discuss any questions you have with your health care provider. Document Released: 05/10/2008 Document Revised: 07/11/2016 Document Reviewed: 08/16/2015 Elsevier Interactive Patient Education  [employer].

## 2018-01-17 ENCOUNTER — Encounter: Payer: Self-pay | Admitting: Family Medicine

## 2018-01-17 LAB — COMPREHENSIVE METABOLIC PANEL
ALT: 5 U/L (ref 0–53)
AST: 16 U/L (ref 0–37)
Albumin: 4.5 g/dL (ref 3.5–5.2)
Alkaline Phosphatase: 79 U/L (ref 39–117)
BILIRUBIN TOTAL: 0.7 mg/dL (ref 0.2–1.2)
BUN: 24 mg/dL — AB (ref 6–23)
CALCIUM: 9.9 mg/dL (ref 8.4–10.5)
CO2: 28 meq/L (ref 19–32)
Chloride: 103 mEq/L (ref 96–112)
Creatinine, Ser: 1.15 mg/dL (ref 0.40–1.50)
GFR: 69.55 mL/min (ref >60.00)
GLUCOSE: 91 mg/dL (ref 70–99)
Potassium: 4.9 mEq/L (ref 3.5–5.1)
SODIUM: 140 meq/L (ref 135–145)
Total Protein: 6.8 g/dL (ref 6.0–8.3)

## 2018-01-17 LAB — LIPID PANEL
CHOL/HDL RATIO: 4
Cholesterol: 185 mg/dL (ref 0–200)
HDL: 45.1 mg/dL (ref >39.00)
LDL CALC: 125 mg/dL — AB (ref 0–99)
NONHDL: 139.97
Triglycerides: 75 mg/dL (ref 0.0–149.0)
VLDL: 15 mg/dL (ref 0.0–40.0)

## 2018-01-17 LAB — CBC
HCT: 43.3 % (ref 39.0–52.0)
Hemoglobin: 14.5 g/dL (ref 13.0–17.0)
MCHC: 33.6 g/dL (ref 30.0–36.0)
MCV: 95.7 fl (ref 78.0–100.0)
PLATELETS: 248 10*3/uL (ref 150.0–400.0)
RBC: 4.52 Mil/uL (ref 4.22–5.81)
RDW: 13.5 % (ref 11.5–15.5)
WBC: 4.3 10*3/uL (ref 4.0–10.5)

## 2018-01-17 LAB — HEPATITIS C ANTIBODY
HEP C AB: NONREACTIVE
SIGNAL TO CUT-OFF: 0 (ref <1.00)

## 2018-01-17 LAB — PSA: PSA: 2.89 ng/mL (ref 0.10–4.00)

## 2018-01-17 NOTE — Addendum Note (Signed)
Addended by: Darreld Mclean on: 01/17/2018 06:38 PM   Modules accepted: Orders

## 2018-01-27 ENCOUNTER — Other Ambulatory Visit: Payer: Self-pay | Admitting: Neurology

## 2018-01-27 MED ORDER — CARBIDOPA-LEVODOPA 25-100 MG PO TABS: 1.0000 | ORAL_TABLET | Freq: Three times a day (TID) | ORAL | 1 refills | Status: DC

## 2018-01-28 ENCOUNTER — Ambulatory Visit (HOSPITAL_COMMUNITY)
Admission: RE | Admit: 2018-01-28 | Discharge: 2018-01-28 | Disposition: A | Discharge status: Home / Self Care | Payer: Commercial Managed Care - PPO | Source: Ambulatory Visit | Attending: Hematology & Oncology | Admitting: Hematology & Oncology

## 2018-01-28 DIAGNOSIS — C8190 Hodgkin lymphoma, unspecified, unspecified site: Secondary | ICD-10-CM | POA: Diagnosis not present

## 2018-01-28 DIAGNOSIS — C8195 Hodgkin lymphoma, unspecified, lymph nodes of inguinal region and lower limb: Secondary | ICD-10-CM | POA: Diagnosis present

## 2018-01-28 LAB — GLUCOSE, CAPILLARY: Glucose-Capillary: 106 mg/dL — ABNORMAL HIGH (ref 65–99)

## 2018-01-28 MED ORDER — FLUDEOXYGLUCOSE F - 18 (FDG) INJECTION
9.2800 | Freq: Once | INTRAVENOUS | Status: AC | PRN
  Administered 2018-01-28: 9.28 via INTRAVENOUS

## 2018-01-30 ENCOUNTER — Encounter: Payer: Self-pay | Admitting: Hematology & Oncology

## 2018-01-30 ENCOUNTER — Other Ambulatory Visit: Payer: Self-pay | Admitting: Hematology & Oncology

## 2018-01-30 DIAGNOSIS — C8195 Hodgkin lymphoma, unspecified, lymph nodes of inguinal region and lower limb: Secondary | ICD-10-CM

## 2018-02-03 ENCOUNTER — Encounter: Payer: Self-pay | Admitting: Hematology & Oncology

## 2018-02-06 ENCOUNTER — Encounter: Payer: Self-pay | Admitting: Family Medicine

## 2018-02-06 ENCOUNTER — Telehealth: Payer: Self-pay

## 2018-02-06 NOTE — Telephone Encounter (Signed)
Copied from [place]. Topic: General - Other >> Feb 06, 2018 12:38 PM Yvette Rack wrote: Reason for CRM: patient calling wanting someone to call him of the scan that he had done on 01-28-18 he has questions about his results

## 2018-02-07 ENCOUNTER — Other Ambulatory Visit: Payer: Commercial Managed Care - PPO

## 2018-02-07 ENCOUNTER — Ambulatory Visit: Payer: Commercial Managed Care - PPO | Admitting: Hematology & Oncology

## 2018-02-19 DIAGNOSIS — L218 Other seborrheic dermatitis: Secondary | ICD-10-CM | POA: Diagnosis not present

## 2018-02-19 DIAGNOSIS — D225 Melanocytic nevi of trunk: Secondary | ICD-10-CM | POA: Diagnosis not present

## 2018-02-19 DIAGNOSIS — L821 Other seborrheic keratosis: Secondary | ICD-10-CM | POA: Diagnosis not present

## 2018-02-19 DIAGNOSIS — L820 Inflamed seborrheic keratosis: Secondary | ICD-10-CM | POA: Diagnosis not present

## 2018-03-07 ENCOUNTER — Encounter: Payer: Self-pay | Admitting: Hematology & Oncology

## 2018-03-10 ENCOUNTER — Telehealth: Payer: Self-pay | Admitting: Hematology & Oncology

## 2018-03-10 ENCOUNTER — Telehealth: Payer: Self-pay | Admitting: Neurology

## 2018-03-10 NOTE — Telephone Encounter (Signed)
Patient wants to talk to someone about some blood work that he needs to have done

## 2018-03-10 NOTE — Telephone Encounter (Signed)
Has blood collection kit from [place] that he needs to have drawn and wanted to come to our office. Aware we don't perform phlebotomy in our office. He will call his PCP.

## 2018-03-10 NOTE — Telephone Encounter (Signed)
Received MyChart message from pt wanting to scheule PET scan. Returned call and lmom to inform pt of central radiology schedules number [phone] due to pt requesting certain dates/times

## 2018-03-12 ENCOUNTER — Encounter: Payer: Self-pay | Admitting: Hematology & Oncology

## 2018-03-13 ENCOUNTER — Inpatient Hospital Stay: Payer: Commercial Managed Care - PPO

## 2018-03-17 ENCOUNTER — Inpatient Hospital Stay: Payer: Commercial Managed Care - PPO | Attending: Hematology & Oncology

## 2018-04-03 NOTE — Progress Notes (Signed)
Brian [name] was seen today in the movement disorders clinic for neurologic consultation at the request of Copland, [name], MD.  The consultation is for the evaluation of parkinsonism.  I have reviewed numerous records made available to me.  The patient previously saw Dr. Janann [name] and subsequently Dr. Krista [name].  He saw Dr. Janann [name] when he was in the hospital in April, 2016.  That was for an event in which he experienced 1-2 minutes of speech and word finding trouble.  Dr. Janann [name] doubted that was a TIA.  He had an EEG that was negative.  He had an MRI of the brain on 03/04/2015 without gadolinium that I had the opportunity to review.  There were a very few scattered T2 hyperintensities, but diffusion-weighted imaging was negative.  MRSA was negative.  He followed up with Dr. Krista [name] one time on an outpatient basis, but nothing further was revealed.  Today, the patient presents because of shuffling of the feet for about 8 months.  Pt states that it started dragging of the L foot but now seems to be dragging both.  He has also noted handwriting and voice changes and has become concerned about the possibility of Parkinson's disease.  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  He also has a history of chemotherapy-induced peripheral neuropathy (Adcetris).  He asks about "psychogenic stuff."  States that he has been through divorce along with his cancer.  Asks if his sx's could be from stress as he has been reading about psychogenic manifestations of sx's.  08/22/16 update: The patient follows up today for testing results.  This patient is accompanied in the office by his ex wife who supplements the history.  He had a DaT scan done on 08/16/16 and showed abnormal grade 3 uptake, virtually absent bilaterally in both putamen and caudate nuclei.    He has not had any falls since last visit.  Continues to have difficulty with picking up the feet.  He denies lightheadedness or near syncope.  He denies hallucinations.  He continues to have issues with depression.  No SI/HI.  Ex wife concerned about mood.  Also noting stiffness, some drooling, constipation.    11/27/16 update: The patient follows up today, accompanied by his ex-wife (and friend) who supplements the history.  The patient was started on pramipexole last visit and worked up to 0.5 mg 3 times per day.  He denies any compulsive behaviors or sleep attacks.  He states that he is doing better but not as good as he thought that he would.  He is shuffling some.  He is exercising with his bike.  He has been only to 2 physical therapy sessions.  He declined speech and occupational therapy, despite the fact that the therapist recommended them as did I.  He has not had any falls.  Denies lightheadedness or syncope.  He continues to struggle with depression but states that mood has been better.  Last visit, he was given names of psychiatrist but refused to go, stating that he did not need a psychiatrist and had a Social worker.  He called me for a prescription for Xanax, but this is not a medication that I generally recommended my Parkinson's patients.  03/05/17 update:  Patient follows up today.  He is unaccompanied today.   The  patient is on pramipexole but we increased that last visit to 1.0 mg tid.  No falls but some near falls.  He is doing rock steady one day a week.  There've been no compulsive behaviors.  No sleep attacks.  No hallucinations.  No lightheadedness or near syncope.  He was seen last by the bone marrow transplant team at [facility] in January, 2018.  I have reviewed those records.  He is currently disease-free from a lymphoma standpoint.  07/09/17 update:  Pt f/u today for PD.  The records that were made available to me were reviewed since last visit.  He had a repeat PET scan at [facility] that  was also interpreted at [facility].  There were hypermetabolic nodes in the mediastinal region.  Findings were concerning for recurrence of lymphoma.  Pt states that the plan is to repeat it in September.  In progress to Parkinson's disease, the patient is still on pramipexole 1.0 mg tid.  He is feeling like he is always walking on the balls of the feet and describing a festinating gait.  He has some feeling of retropulsion.  No compulsive behaviors.  No falls.  No lightheadedness/near syncope.  He is exercising on his bike a few times per week.  He is "profusely sweating."  Did fall out of bed onto the nightstand while asleep.  11/07/17 update:  Pt seen in f/u for PD.  The records that were made available to me were reviewed.  Had a PET scan demonstrating hypermetabolic lesion at T1.  Had MRI thoracic spine that confirmed focal metastatic disease.  Ultimately had a bx to confirm it was return of lymphoma and bx was suggestive of such.  Has been receiving focal radiation to that area and finished that on tuesday.  In regards to PD he is on pramipexole 1.0 mg tid and last visit we started carbidopa/levodopa 25/100 tid.  He wanted a 2nd opinion at [facility] interdisciplinary clinic.  It appears that this didn't happen (don't think that referral got placed).  Pt states that he decided he actually didn't want to go.  He is doing much better with the addition of carbidopa/levodopa so he didn't feel the need to go.  He is doing 5-6 miles on his bike a few times a week.  Pt denies falls.  Pt denies lightheadedness, near syncope.  No hallucinations.  Mood has been good.  04/03/18 update: Patient is seen today for follow-up for his Parkinson's disease.  I have reviewed records since our last visit.  The patient remains on carbidopa/levodopa 25/100, 1 tablet 3 times per day and pramipexole 1 mg 3 times per day.  He has not had any falls.  No lightheadedness or near syncope except when sometimes bends over in the middle of exercise he  will have a near syncopal feeling that goes away a min or so after he stands back up. Only does it with exercise.  Denies compulsive behaviors.   He has attended a new group for younger patients with Parkinson's.  Patient had a PET scan last on January 28, 2018 and scheduled for a repeat on May 01, 2018.  Last one demonstrated resolution of the focal hypermetabolic activity in the left transverse process of T1.  There was new focal uptake in the right lateral fourth rib, but this was without corresponding abnormality on CT.  ALLERGIES:  No Known Allergies  CURRENT MEDICATIONS:  Outpatient Encounter Medications as of 04/08/2018  Medication Sig  . acetaminophen (TYLENOL) 325 MG  tablet Take 325 mg by mouth every 6 (six) hours as needed for mild pain.  . carbidopa-levodopa (SINEMET IR) 25-100 MG tablet Take 1 tablet by mouth 3 (three) times daily.  . Cholecalciferol (VITAMIN D-3) 1000 UNITS CAPS Take 1 capsule by mouth daily.   Marland Kitchen ibuprofen (ADVIL,MOTRIN) 200 MG tablet Take 200 mg by mouth.  . pramipexole (MIRAPEX) 1 MG tablet Take 1 tablet (1 mg total) by mouth 3 (three) times daily.  . [DISCONTINUED] aspirin 81 MG chewable tablet Chew 1 tablet (81 mg total) by mouth daily.  . [DISCONTINUED] Multiple Vitamin (MULTIVITAMIN) tablet Take 1 tablet by mouth once a week.    No facility-administered encounter medications on file as of 04/08/2018.     PAST MEDICAL HISTORY:   Past Medical History:  Diagnosis Date  . Anxiety   . Dysrhythmia    past hx pvc  . H/O autologous stem cell transplant ([place])    may 2014  at [facility]  . History of Bell's palsy    2009  RIGHT SIDE--  HAS 80% FUNCTION / PT STATES A LITTLE ASYMETRICAL AND EFFECTS MOUTH  . History of radiation therapy 10/18/17-11/05/17   sprine T1 26 Gy in 13 fractions, spine boost 10 Gy in 5 fractions  . Hodgkin's disease, nodular sclerosis, of inguinal region/lower limb ([place]) ONOLOGIST--  DR [facility]NE[facility] AND A [facility]     S[facility]V[place] CHEMO 2013/  AUTOLOGUS STEM  CELL TRANSPLANT MAY 2014 AT [facility]  . Mass of right inguinal region   . PVC (premature ventricular contraction)    "benign"  . TIA (transient ischemic attack) 02/2015   "probable TIA"  . Wears contact lenses     PAST SURGICAL HISTORY:   Past Surgical History:  Procedure Laterality Date  . AXILLARY LYMPH NODE BIOPSY Left 02/02/2013   Procedure: NEEDLE LOCALIZED AXILLARY LYMPH NODE BIOPSY;  Surgeon: Haywood Lasso, MD;  Location: [facility];  Service: General;  Laterality: Left;  . BONE MARROW BIOPSY  08/2012  . CYST REMOVAL NECK Left 1980  . LEFT INGUINAL LYMPH NODE BX  09-09-2012  . LYMPH NODE BIOPSY N/A 07/28/2015   Procedure: LYMPH NODE BIOPSY;  Surgeon: Melrose Nakayama, MD;  Location: [facility];  Service: Thoracic;  Laterality: N/A;  . NODE DISSECTION Right 07/28/2015   Procedure: NODE DISSECTION;  Surgeon: Melrose Nakayama, MD;  Location: [facility];  Service: Thoracic;  Laterality: Right;  . PLEURA BIOPSY Left 05/31/2014  . REMOVAL RIGHT INGUINAL LYMPH NODES  08-16-2011  . SCROTAL EXPLORATION Right 01/04/2014   Procedure: SCROTUM EXPLORATION   INGUINAL , EXCISION OF CYSTIC MASS OF RIGHT SPERMATIC CORD, WITH FROZEN SECTION;  Surgeon: Franchot Gallo, MD;  Location: [facility];  Service: Urology;  Laterality: Right;  . TRANSTHORACIC ECHOCARDIOGRAM  03-18-2013   MILD LVH/  EF 55-60%  . VIDEO ASSISTED THORACOSCOPY Left 05/31/2014   Procedure: LEFT VIDEO ASSISTED THORACOSCOPY, PLEURAL BIOPSY;  Surgeon: Melrose Nakayama, MD;  Location: [facility];  Service: Thoracic;  Laterality: Left;  Marland Kitchen VIDEO ASSISTED THORACOSCOPY Right 07/28/2015   Procedure: RIGHT VIDEO ASSISTED THORACOSCOPY;  Surgeon: Melrose Nakayama, MD;  Location: [facility];  Service: Thoracic;  Laterality: Right;  Marland Kitchen VIDEO ASSISTED THORACOSCOPY (VATS)/ LYMPH NODE SAMPLING Right 07/28/2015  . VIDEO ASSISTED THORACOSCOPY (VATS)/WEDGE RESECTION  05/31/2014  . VIDEO BRONCHOSCOPY WITH ENDOBRONCHIAL ULTRASOUND   07/28/2015  . VIDEO BRONCHOSCOPY WITH ENDOBRONCHIAL ULTRASOUND N/A 07/28/2015   Procedure: VIDEO BRONCHOSCOPY WITH ENDOBRONCHIAL ULTRASOUND;  Surgeon: Melrose Nakayama, MD;  Location[facility];  Service: Thoracic;  Laterality: N/A;    SOCIAL HISTORY:   Social History   Socioeconomic History  . Marital status: Divorced    Spouse name: Not on file  . Number of children: Not on file  . Years of education: Not on file  . Highest education level: Not on file  Occupational History  . Occupation: Oncologist: [employer]  . Financial resource strain: Not on file  . Food insecurity:    Worry: Not on file    Inability: Not on file  . Transportation needs:    Medical: Not on file    Non-medical: Not on file  Tobacco Use  . Smoking status: Never Smoker  . Smokeless tobacco: Never Used  Substance and Sexual Activity  . Alcohol use: Yes    Alcohol/week: 8.4 oz    Types: 7 Cans of beer, 7 Glasses of wine per week  . Drug use: No  . Sexual activity: Not Currently  Lifestyle  . Physical activity:    Days per week: Not on file    Minutes per session: Not on file  . Stress: Not on file  Relationships  . Social connections:    Talks on phone: Not on file    Gets together: Not on file    Attends religious service: Not on file    Active member of club or organization: Not on file    Attends meetings of clubs or organizations: Not on file    Relationship status: Not on file  . Intimate partner violence:    Fear of current or ex partner: Not on file    Emotionally abused: Not on file    Physically abused: Not on file    Forced sexual activity: Not on file  Other Topics Concern  . Not on file  Social History Narrative   Married. Education: [employer]. Exercise: jog/walk/ lift weights 7 days a week, 1-2 miles.    FAMILY HISTORY:   Family Status  Relation Name Status  . Mother  Deceased  . Father  Deceased  . Sister  Alive  . Brother 3 Alive  . Son 2 Alive     ROS:  Feet and toe paresthesias with neuropathy.  A complete 10 system review of systems was obtained and was unremarkable apart from what is mentioned above.  PHYSICAL EXAMINATION:    VITALS:   Vitals:   04/08/18 1527  BP: 114/76  Pulse: 86  SpO2: 98%  Weight: 182 lb (82.6 kg)  Height: '5\' 10"'$  (1.778 m)       GEN:  The patient appears stated age and is in NAD.  Jovial HEENT:  Normocephalic, atraumatic.  The mucous membranes are moist. The superficial temporal arteries are without ropiness or tenderness. CV:    Regular rate and rhythm. Lungs:  CTAB Neck/HEME:  There are no carotid bruits bilaterally.  Neurological examination:  Orientation: The patient is alert and oriented x3. Fund of knowledge is appropriate.  Cranial nerves: There is slight right facial droop, only noticeable because of decreased nasolabial fold on the right.  He is able to symmetrically activate the muscles of facial expression.  He does have abnormal synkinetic reinnervation of the muscles of facial expression on the right.  He does not have a Bell's phenomenon.  There is facial hypomimia.    The speech is fluent and clear.  He is hypophonic but less so than previous.  Soft palate rises symmetrically  and there is no tongue deviation. Hearing is intact to conversational tone. Sensation: Sensation is intact to light throughout.   Movement examination: Tone: There is mild LUE rigidity (same as last visit) Abnormal movements: None Coordination:  There is no decremation with any form of RAMS, including alternating supination and pronation of the forearm, hand opening and closing, finger taps, heel taps and toe taps. Gait and Station: The patient has no difficulty arising out of a deep-seated chair without the use of the hands. The patient's stride length is normal, although he does slightly drag the L leg.   ASSESSMENT/PLAN:  1.   Parkinson's disease.  -The patient had a DaT scan on 08/16/2016 that showed  abnormal grade 3 uptake, virtually absent bilaterally in both putamen and caudate nuclei.   -We discussed the diagnosis as well as pathophysiology of the disease.  We discussed treatment options as well as prognostic indicators.  Patient education was provided.  -We decided to continue pramipexole 1 mg tid.  We discussed in detail the potential risk for compulsive behaviors.  He denies this today.  We discussed risks, benefits, and side effects.  We discussed risk of compulsive behavior and sleep attacks.  Understanding was expressed.  -continue carbidopa/levodopa 25/100 tid.  Risks, benefits, side effects and alternative therapies were discussed, including but limited to dyskinesia.  The opportunity to ask questions was given and they were answered to the best of my ability.  The patient expressed understanding and willingness to follow the outlined treatment protocols.  -Discussed with him to continue safe, cardiovascular exercise.  Discussed with him that the near syncopal feeling he has been exercising and bending over acutely is likely just a vasovagal reaction.  Discussed with that meant.  He does not have this at other times.  -Asked multiple questions about prognostic indicators and I answered them to the best of my ability.  2.  Chemo (adcetris) induced peripheral neuropathy  -The patient's neuropathy is stable, although somewhat bothersome to him.  3.  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  4.  Hodgkins lymphoma, s/p autologous transplant with recurrence post transplant  -Having a repeat PET scan in June for possible lesion in the right fourth rib.  5.  2016 transient (1-2 min) of word/speech trouble  -reviewed hospital records and agree that this likely did not represent TIA and if anything, more consistent with seizure.  In any case, the event has not  recurred.  6.  Depression  -He is markedly better, now that he is adjusted to the diagnosis.   7.  RBD  -No further episodes of falling out of the bed.  Does not want clonazepam.  8. Follow up is anticipated in the next few months, sooner should new neurologic issues arise.  Much greater than 50% of this visit was spent in counseling and coordinating care.  Total face to face time:  25 min

## 2018-04-07 DIAGNOSIS — H16292 Other keratoconjunctivitis, left eye: Secondary | ICD-10-CM | POA: Diagnosis not present

## 2018-04-08 ENCOUNTER — Ambulatory Visit (INDEPENDENT_AMBULATORY_CARE_PROVIDER_SITE_OTHER): Payer: Commercial Managed Care - PPO | Admitting: Neurology

## 2018-04-08 ENCOUNTER — Encounter: Payer: Self-pay | Admitting: Neurology

## 2018-04-08 VITALS — BP 114/76 | HR 86 | Ht 70.0 in | Wt 182.0 lb

## 2018-04-08 DIAGNOSIS — G20 Parkinson's disease: Secondary | ICD-10-CM

## 2018-04-08 DIAGNOSIS — C8195 Hodgkin lymphoma, unspecified, lymph nodes of inguinal region and lower limb: Secondary | ICD-10-CM

## 2018-04-08 NOTE — Patient Instructions (Signed)
Registration is OPEN!    Third Annual Parkinson's Education Symposium   To register: [email or web address]      Search:  [employer] person attending individually Questions: [place], [place] or [email or web address]   Registration is OPEN!    Third Annual Parkinson's Education Symposium   To register: [email or web address]      Search:  [employer] person attending individually Questions: [place], [place] or [email or web address]

## 2018-04-28 ENCOUNTER — Encounter: Payer: Self-pay | Admitting: Hematology & Oncology

## 2018-04-29 ENCOUNTER — Encounter: Payer: Self-pay | Admitting: Hematology & Oncology

## 2018-04-30 ENCOUNTER — Encounter: Payer: Self-pay | Admitting: Hematology & Oncology

## 2018-05-01 ENCOUNTER — Ambulatory Visit (HOSPITAL_COMMUNITY)
Admission: RE | Admit: 2018-05-01 | Discharge: 2018-05-01 | Disposition: A | Discharge status: Home / Self Care | Payer: Commercial Managed Care - PPO | Source: Ambulatory Visit | Attending: Hematology & Oncology | Admitting: Hematology & Oncology

## 2018-05-01 DIAGNOSIS — M898X8 Other specified disorders of bone, other site: Secondary | ICD-10-CM | POA: Diagnosis not present

## 2018-05-01 DIAGNOSIS — Z79899 Other long term (current) drug therapy: Secondary | ICD-10-CM | POA: Insufficient documentation

## 2018-05-01 DIAGNOSIS — C8190 Hodgkin lymphoma, unspecified, unspecified site: Secondary | ICD-10-CM | POA: Diagnosis not present

## 2018-05-01 DIAGNOSIS — C8195 Hodgkin lymphoma, unspecified, lymph nodes of inguinal region and lower limb: Secondary | ICD-10-CM | POA: Diagnosis present

## 2018-05-01 LAB — GLUCOSE, CAPILLARY: GLUCOSE-CAPILLARY: 106 mg/dL — AB (ref 65–99)

## 2018-05-01 MED ORDER — FLUDEOXYGLUCOSE F - 18 (FDG) INJECTION
10.0000 | Freq: Once | INTRAVENOUS | Status: AC | PRN
  Administered 2018-05-01: 10 via INTRAVENOUS

## 2018-05-26 ENCOUNTER — Other Ambulatory Visit: Payer: Self-pay | Admitting: Neurology

## 2018-05-26 MED ORDER — PRAMIPEXOLE DIHYDROCHLORIDE 1 MG PO TABS: 1.0000 mg | ORAL_TABLET | Freq: Three times a day (TID) | ORAL | 1 refills | Status: DC

## 2018-08-18 ENCOUNTER — Encounter: Payer: Self-pay | Admitting: Family Medicine

## 2018-08-18 ENCOUNTER — Telehealth: Payer: Self-pay

## 2018-08-18 NOTE — Telephone Encounter (Signed)
Copied from [place] [id number]. Topic: Quick Communication - See Telephone Encounter >> Aug 18, 2018  8:31 AM Marja Kays F wrote: Pt was doing some target shooting over the weekend and is having issues with his ear and hearing out of it -he is wanting to know if there is anything that can be done for it or does it need to take its course   Best number [phone]

## 2018-08-18 NOTE — Telephone Encounter (Signed)
Called him back, no answer, no option to LM/  Will send a mychart message

## 2018-08-19 NOTE — Progress Notes (Signed)
Brian [name] was seen today in the movement disorders clinic for neurologic consultation at the request of Copland, [name], MD.  The consultation is for the evaluation of parkinsonism.  I have reviewed numerous records made available to me.  The patient previously saw Dr. [name] and subsequently Dr. [name].  He saw Dr. [name] when he was in the hospital in April, 2016.  That was for an event in which he experienced 1-2 minutes of speech and word finding trouble.  Dr. [name] doubted that was a TIA.  He had an EEG that was negative.  He had an MRI of the brain on 03/04/2015 without gadolinium that I had the opportunity to review.  There were a very few scattered T2 hyperintensities, but diffusion-weighted imaging was negative.  MRSA was negative.  He followed up with Dr. [name] one time on an outpatient basis, but nothing further was revealed.  Today, the patient presents because of shuffling of the feet for about 8 months.  Pt states that it started dragging of the L foot but now seems to be dragging both.  He has also noted handwriting and voice changes and has become concerned about the possibility of Parkinson's disease.  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  He also has a history of chemotherapy-induced peripheral neuropathy (Adcetris).  He asks about "psychogenic stuff."  States that he has been through divorce along with his cancer.  Asks if his sx's could be from stress as he has been reading about psychogenic manifestations of sx's.  08/22/16 update: The patient follows up today for testing results.  This patient is accompanied in the office by his ex wife who supplements the history.  He had a DaT scan done on 08/16/16 and showed abnormal grade 3 uptake, virtually absent bilaterally in both putamen and caudate nuclei.    He has not had any falls since last visit.  Continues to have difficulty with picking up the feet.  He denies lightheadedness or near syncope.  He denies hallucinations.  He continues to have issues with depression.  No SI/HI.  Ex wife concerned about mood.  Also noting stiffness, some drooling, constipation.    11/27/16 update: The patient follows up today, accompanied by his ex-wife (and friend) who supplements the history.  The patient was started on pramipexole last visit and worked up to 0.5 mg 3 times per day.  He denies any compulsive behaviors or sleep attacks.  He states that he is doing better but not as good as he thought that he would.  He is shuffling some.  He is exercising with his bike.  He has been only to 2 physical therapy sessions.  He declined speech and occupational therapy, despite the fact that the therapist recommended them as did I.  He has not had any falls.  Denies lightheadedness or syncope.  He continues to struggle with depression but states that mood has been better.  Last visit, he was given names of psychiatrist but refused to go, stating that he did not need a psychiatrist and had a Social worker.  He called me for a prescription for Xanax, but this is not a medication that I generally recommended my Parkinson's patients.  03/05/17 update:  Patient follows up today.  He is unaccompanied today.   The  patient is on pramipexole but we increased that last visit to 1.0 mg tid.  No falls but some near falls.  He is doing rock steady one day a week.  There've been no compulsive behaviors.  No sleep attacks.  No hallucinations.  No lightheadedness or near syncope.  He was seen last by the bone marrow transplant team at [facility] in January, 2018.  I have reviewed those records.  He is currently disease-free from a lymphoma standpoint.  07/09/17 update:  Pt f/u today for PD.  The records that were made available to me were reviewed since last visit.  He had a repeat PET scan at [facility] that  was also interpreted at [facility].  There were hypermetabolic nodes in the mediastinal region.  Findings were concerning for recurrence of lymphoma.  Pt states that the plan is to repeat it in September.  In progress to Parkinson's disease, the patient is still on pramipexole 1.0 mg tid.  He is feeling like he is always walking on the balls of the feet and describing a festinating gait.  He has some feeling of retropulsion.  No compulsive behaviors.  No falls.  No lightheadedness/near syncope.  He is exercising on his bike a few times per week.  He is "profusely sweating."  Did fall out of bed onto the nightstand while asleep.  11/07/17 update:  Pt seen in f/u for PD.  The records that were made available to me were reviewed.  Had a PET scan demonstrating hypermetabolic lesion at T1.  Had MRI thoracic spine that confirmed focal metastatic disease.  Ultimately had a bx to confirm it was return of lymphoma and bx was suggestive of such.  Has been receiving focal radiation to that area and finished that on tuesday.  In regards to PD he is on pramipexole 1.0 mg tid and last visit we started carbidopa/levodopa 25/100 tid.  He wanted a 2nd opinion at [facility] interdisciplinary clinic.  It appears that this didn't happen (don't think that referral got placed).  Pt states that he decided he actually didn't want to go.  He is doing much better with the addition of carbidopa/levodopa so he didn't feel the need to go.  He is doing 5-6 miles on his bike a few times a week.  Pt denies falls.  Pt denies lightheadedness, near syncope.  No hallucinations.  Mood has been good.  04/03/18 update: Patient is seen today for follow-up for his Parkinson's disease.  I have reviewed records since our last visit.  The patient remains on carbidopa/levodopa 25/100, 1 tablet 3 times per day and pramipexole 1 mg 3 times per day.  He has not had any falls.  No lightheadedness or near syncope except when sometimes bends over in the middle of exercise he  will have a near syncopal feeling that goes away a min or so after he stands back up. Only does it with exercise.  Denies compulsive behaviors.   He has attended a new group for younger patients with Parkinson's.  Patient had a PET scan last on January 28, 2018 and scheduled for a repeat on May 01, 2018.  Last one demonstrated resolution of the focal hypermetabolic activity in the left transverse process of T1.  There was new focal uptake in the right lateral fourth rib, but this was without corresponding abnormality on CT.  08/21/18 update: Patient is seen today for follow-up regarding Parkinson's disease.  He is on carbidopa/levodopa 25/100, 1 tablet 3 times per day and  pramipexole 1 mg 3 times per day.  He does note hypersexual behavior but no prostitution.   He doesn't want to change medication.  No falls.  No sleep attacks.  No compulsive behaviors.  He had one incident while he was out jogging and he stopped to get his breath and was holding onto a pole and the next thing he was on his bottom.  He thinks that he was only out 5 seconds.  He hadn't eaten or drank anything but coffee that day.  That was about a month ago.  He does not some dizziness with bending over but not all the time. No hallucinations.  Reports depression based on a rocky relationship with girlfriend.  No SI/HI.  Notes that the stress will affect him physically with more shuffling.  More drooling and its frustrating but doesn't want med/botox.  Asks about marijuana/cbd.  ALLERGIES:  No Known Allergies  CURRENT MEDICATIONS:  Outpatient Encounter Medications as of 08/21/2018  Medication Sig  . acetaminophen (TYLENOL) 325 MG tablet Take 325 mg by mouth every 6 (six) hours as needed for mild pain.  . carbidopa-levodopa (SINEMET IR) 25-100 MG tablet Take 1 tablet by mouth 3 (three) times daily.  . Cholecalciferol (VITAMIN D-3) 1000 UNITS CAPS Take 1 capsule by mouth daily.   Marland Kitchen ibuprofen (ADVIL,MOTRIN) 200 MG tablet Take 200 mg by mouth.    . pramipexole (MIRAPEX) 1 MG tablet Take 1 tablet (1 mg total) by mouth 3 (three) times daily.   No facility-administered encounter medications on file as of 08/21/2018.     PAST MEDICAL HISTORY:   Past Medical History:  Diagnosis Date  . Anxiety   . Dysrhythmia    past hx pvc  . H/O autologous stem cell transplant ([place])    may 2014  at [facility]  . History of Bell's palsy    2009  RIGHT SIDE--  HAS 80% FUNCTION / PT STATES A LITTLE ASYMETRICAL AND EFFECTS MOUTH  . History of radiation therapy 10/18/17-11/05/17   sprine T1 26 Gy in 13 fractions, spine boost 10 Gy in 5 fractions  . Hodgkin's disease, nodular sclerosis, of inguinal region/lower limb ([place]) ONOLOGIST--  DR [facility]NE[facility] AND A [facility]     [place] CHEMO 2013/  AUTOLOGUS STEM CELL TRANSPLANT MAY 2014 AT [facility]  . Mass of right inguinal region   . PVC (premature ventricular contraction)    "benign"  . TIA (transient ischemic attack) 02/2015   "probable TIA"  . Wears contact lenses     PAST SURGICAL HISTORY:   Past Surgical History:  Procedure Laterality Date  . AXILLARY LYMPH NODE BIOPSY Left 02/02/2013   Procedure: NEEDLE LOCALIZED AXILLARY LYMPH NODE BIOPSY;  Surgeon: Haywood Lasso, MD;  Location: [place];  Service: General;  Laterality: Left;  . BONE MARROW BIOPSY  08/2012  . CYST REMOVAL NECK Left 1980  . LEFT INGUINAL LYMPH NODE BX  09-09-2012  . LYMPH NODE BIOPSY N/A 07/28/2015   Procedure: LYMPH NODE BIOPSY;  Surgeon: Melrose Nakayama, MD;  Location: [place];  Service: Thoracic;  Laterality: N/A;  . NODE DISSECTION Right 07/28/2015   Procedure: NODE DISSECTION;  Surgeon: Melrose Nakayama, MD;  Location: [place];  Service: Thoracic;  Laterality: Right;  . PLEURA BIOPSY Left 05/31/2014  . REMOVAL RIGHT INGUINAL LYMPH NODES  08-16-2011  . SCROTAL EXPLORATION Right 01/04/2014   Procedure: SCROTUM EXPLORATION   INGUINAL , EXCISION OF CYSTIC MASS OF RIGHT SPERMATIC CORD, WITH FROZEN SECTION;  Surgeon:  Franchot Gallo, MD;  Location: [facility];  Service: Urology;  Laterality: Right;  . TRANSTHORACIC ECHOCARDIOGRAM  03-18-2013   MILD LVH/  EF 55-60%  . VIDEO ASSISTED THORACOSCOPY Left 05/31/2014   Procedure: LEFT VIDEO ASSISTED THORACOSCOPY, PLEURAL BIOPSY;  Surgeon: Melrose Nakayama, MD;  Location: [place];  Service: Thoracic;  Laterality: Left;  Marland Kitchen VIDEO ASSISTED THORACOSCOPY Right 07/28/2015   Procedure: RIGHT VIDEO ASSISTED THORACOSCOPY;  Surgeon: Melrose Nakayama, MD;  Location: [place];  Service: Thoracic;  Laterality: Right;  Marland Kitchen VIDEO ASSISTED THORACOSCOPY (VATS)/ LYMPH NODE SAMPLING Right 07/28/2015  . VIDEO ASSISTED THORACOSCOPY (VATS)/WEDGE RESECTION  05/31/2014  . VIDEO BRONCHOSCOPY WITH ENDOBRONCHIAL ULTRASOUND  07/28/2015  . VIDEO BRONCHOSCOPY WITH ENDOBRONCHIAL ULTRASOUND N/A 07/28/2015   Procedure: VIDEO BRONCHOSCOPY WITH ENDOBRONCHIAL ULTRASOUND;  Surgeon: Melrose Nakayama, MD;  Location: [place];  Service: Thoracic;  Laterality: N/A;    SOCIAL HISTORY:   Social History   Socioeconomic History  . Marital status: Divorced    Spouse name: Not on file  . Number of children: Not on file  . Years of education: Not on file  . Highest education level: Not on file  Occupational History  . Occupation: Oncologist: [employer]  . Financial resource strain: Not on file  . Food insecurity:    Worry: Not on file    Inability: Not on file  . Transportation needs:    Medical: Not on file    Non-medical: Not on file  Tobacco Use  . Smoking status: Never Smoker  . Smokeless tobacco: Never Used  Substance and Sexual Activity  . Alcohol use: Yes    Alcohol/week: 14.0 standard drinks    Types: 7 Cans of beer, 7 Glasses of wine per week  . Drug use: No  . Sexual activity: Not Currently  Lifestyle  . Physical activity:    Days per week: Not on file    Minutes per session: Not on file  . Stress: Not on file  Relationships  . Social  connections:    Talks on phone: Not on file    Gets together: Not on file    Attends religious service: Not on file    Active member of club or organization: Not on file    Attends meetings of clubs or organizations: Not on file    Relationship status: Not on file  . Intimate partner violence:    Fear of current or ex partner: Not on file    Emotionally abused: Not on file    Physically abused: Not on file    Forced sexual activity: Not on file  Other Topics Concern  . Not on file  Social History Narrative   Married. Education: [employer]. Exercise: jog/walk/ lift weights 7 days a week, 1-2 miles.    FAMILY HISTORY:   Family Status  Relation Name Status  . Mother  Deceased  . Father  Deceased  . Sister  Alive  . Brother 3 Alive  . Son 2 Alive    ROS:  Review of Systems  Constitutional: Negative.   HENT: Negative.   Respiratory: Negative.   Cardiovascular: Negative.   Gastrointestinal: Negative.   Musculoskeletal: Negative.   Neurological: Positive for tingling (paresthesias, feet from chemo) and tremors (action).  Endo/Heme/Allergies: Negative.   Psychiatric/Behavioral: Positive for depression.     PHYSICAL EXAMINATION:    VITALS:   Vitals:   08/21/18 1356  BP: 114/74  Pulse: (!) 108  SpO2: 98%  Weight: 179 lb (81.2 kg)  Height: '5\' 10"'$  (1.778 m)       GEN:  The patient appears stated age and is in NAD.   HEENT:  Normocephalic, atraumatic.  The mucous membranes are moist. The superficial temporal arteries are without ropiness or tenderness. CV:    Tachycardic.  regular Lungs:  CTAB Neck/HEME:  There are no carotid bruits bilaterally.  Neurological examination:  Orientation: The patient is alert and oriented x3. Fund of knowledge is appropriate.  Cranial nerves: There is slight right facial droop, only noticeable because of decreased nasolabial fold on the right.  He is able to symmetrically activate the muscles of facial expression.  He does have abnormal  synkinetic reinnervation of the muscles of facial expression on the right.  He does not have a Bell's phenomenon.  There is facial hypomimia.    The speech is fluent and clear.    Soft palate rises symmetrically and there is no tongue deviation. Hearing is intact to conversational tone. Sensation: Sensation is intact to light throughout.   Movement examination: Tone: There is mild LUE rigidity Abnormal movements: None Coordination:  There is no decremation with any form of RAMS, including alternating supination and pronation of the forearm, hand opening and closing, finger taps, heel taps and toe taps. Gait and Station: The patient has no difficulty arising out of a deep-seated chair without the use of the hands. The patient's stride length is normal, although he does slightly drag the L leg.   ASSESSMENT/PLAN:  1.   Parkinson's disease.  -The patient had a DaT scan on 08/16/2016 that showed abnormal grade 3 uptake, virtually absent bilaterally in both putamen and caudate nuclei.   -We discussed the diagnosis as well as pathophysiology of the disease.  We discussed treatment options as well as prognostic indicators.  Patient education was provided.  -long discussion about pramipexole.  His hypersexual behavior is likely from this.  Told him that we could/should reduce dosage as this is a dose-dependent side effect.  He really does not want to do that.  He states that he has not been inappropriate, and that the hypersexuality is within relationships.  I told him that I could decrease the pramipexole and subsequently increase the levodopa, but overall he does not want to do that right now.  I agreed to continue 1 mg 3 times per day of pramipexole, but we will need to watch this closely.  I did tell him not to go up on this dosage.    -continue carbidopa/levodopa 25/100 tid.  I did tell him he could take 1 extra of these throughout the day if he needed it (bad day).  Risks, benefits, side effects and  alternative therapies were discussed, including but limited to dyskinesia.  The opportunity to ask questions was given and they were answered to the best of my ability.  The patient expressed understanding and willingness to follow the outlined treatment protocols.  -Discussed with him to continue safe, cardiovascular exercise.    2.  Chemo (adcetris) induced peripheral neuropathy  -The patient's neuropathy is stable, although somewhat bothersome to him.  3.  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  4.  Hodgkins lymphoma, s/p autologous transplant with recurrence post transplant  -Having a repeat PET scan in June for possible lesion in the right fourth rib.  5.  2016 transient (1-2 min) of word/speech trouble  -reviewed hospital records and agree that this likely did not represent TIA and if anything, more consistent with seizure.  In any case, the event has not recurred.  6.  Depression  -He is worse because of a relationship.  He doesn't want to add more medication.  He is back in counseling.  No suicidal or homicidal ideation.  7.  RBD  -No further episodes of falling out of the bed.  Does not want clonazepam.  8. Sialorrhea  -This is commonly associated with PD.  We talked about treatments.  The patient is not a candidate for oral anticholinergic therapy because of increased risk of confusion and falls.  We discussed Botox (type A and B) and 1% atropine drops.  We discusssed that candy like lemon drops can help by stimulating mm of the oropharynx to induce swallowing.  He does not want more medication nor does he want Botox.  9.  Probable vasovagal syncope while running  -He was not orthostatic in the office today.  Talked about making sure he is drinking plenty of water.  Safety discussed as well as [employer].  10. Follow up is anticipated in the next  few months, sooner should new neurologic issues arise.  Much greater than 50% of this visit was spent in counseling and coordinating care.  Total face to face time:  25 min

## 2018-08-21 ENCOUNTER — Encounter: Payer: Self-pay | Admitting: Neurology

## 2018-08-21 ENCOUNTER — Ambulatory Visit (INDEPENDENT_AMBULATORY_CARE_PROVIDER_SITE_OTHER): Payer: Commercial Managed Care - PPO | Admitting: Neurology

## 2018-08-21 VITALS — BP 114/74 | HR 108 | Ht 70.0 in | Wt 179.0 lb

## 2018-08-21 DIAGNOSIS — R55 Syncope and collapse: Secondary | ICD-10-CM | POA: Diagnosis not present

## 2018-08-21 DIAGNOSIS — K117 Disturbances of salivary secretion: Secondary | ICD-10-CM

## 2018-08-21 DIAGNOSIS — F330 Major depressive disorder, recurrent, mild: Secondary | ICD-10-CM | POA: Diagnosis not present

## 2018-08-21 DIAGNOSIS — G20 Parkinson's disease: Secondary | ICD-10-CM

## 2018-08-27 ENCOUNTER — Encounter: Payer: Self-pay | Admitting: Family Medicine
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
## 2018-08-27 NOTE — Telephone Encounter (Signed)
Called him and schedule a visit tomorrow to check on his ear

## 2018-08-27 NOTE — Telephone Encounter (Signed)
Pt called to speak w/ pcp about an ear/hearing issue; pt would like to be contacted this morning if possible; pt had a couple of questions or concerns; contact to advise

## 2018-08-28 ENCOUNTER — Ambulatory Visit (INDEPENDENT_AMBULATORY_CARE_PROVIDER_SITE_OTHER): Payer: Commercial Managed Care - PPO | Admitting: Family Medicine

## 2018-08-28 ENCOUNTER — Encounter: Payer: Self-pay | Admitting: Family Medicine

## 2018-08-28 VITALS — BP 136/88 | HR 80 | Temp 98.4°F | Resp 16 | Ht 70.0 in | Wt 179.0 lb

## 2018-08-28 DIAGNOSIS — H9312 Tinnitus, left ear: Secondary | ICD-10-CM

## 2018-08-28 DIAGNOSIS — H833X2 Noise effects on left inner ear: Secondary | ICD-10-CM

## 2018-08-28 DIAGNOSIS — N529 Male erectile dysfunction, unspecified: Secondary | ICD-10-CM

## 2018-08-28 DIAGNOSIS — R61 Generalized hyperhidrosis: Secondary | ICD-10-CM | POA: Diagnosis not present

## 2018-08-28 LAB — CBC
HCT: 45.2 % (ref 39.0–52.0)
Hemoglobin: 15 g/dL (ref 13.0–17.0)
MCHC: 33.3 g/dL (ref 30.0–36.0)
MCV: 98.8 fl (ref 78.0–100.0)
Platelets: 255 10*3/uL (ref 150.0–400.0)
RBC: 4.57 Mil/uL (ref 4.22–5.81)
RDW: 13.7 % (ref 11.5–15.5)
WBC: 5.6 10*3/uL (ref 4.0–10.5)

## 2018-08-28 MED ORDER — SILDENAFIL CITRATE 20 MG PO TABS: ORAL_TABLET | ORAL | 1 refills | Status: DC

## 2018-08-28 NOTE — Patient Instructions (Addendum)
It was good to see you today- please let me know if you do want to see ENT and I can set it up for you We will get some labs for you today to rule out some various causes of night sweats  For ED, try the 20 mg sildenafil as needed.  Start with 1 pill- can take up to 100 mg (5 pills) max if needed  If dizziness/ lightheadedness stop use of med and let me know

## 2018-08-29 ENCOUNTER — Encounter: Payer: Self-pay | Admitting: Family Medicine

## 2018-08-29 LAB — QUANTIFERON-TB GOLD PLUS
Mitogen-NIL: >10 IU/mL
NIL: 0.02 IU/mL
QuantiFERON-TB Gold Plus: NEGATIVE
TB1-NIL: 0.01 IU/mL
TB2-NIL: <0 IU/mL

## 2018-08-29 LAB — HIV ANTIBODY (ROUTINE TESTING W REFLEX): HIV: NONREACTIVE

## 2018-09-01 ENCOUNTER — Telehealth: Payer: Self-pay | Admitting: Neurology

## 2018-09-01 ENCOUNTER — Other Ambulatory Visit: Payer: Self-pay | Admitting: Hematology & Oncology

## 2018-09-01 ENCOUNTER — Encounter: Payer: Self-pay | Admitting: Hematology & Oncology

## 2018-09-01 DIAGNOSIS — C8195 Hodgkin lymphoma, unspecified, lymph nodes of inguinal region and lower limb: Secondary | ICD-10-CM

## 2018-09-01 MED ORDER — CARBIDOPA-LEVODOPA 25-100 MG PO TABS: 1.0000 | ORAL_TABLET | Freq: Three times a day (TID) | ORAL | 1 refills | Status: DC

## 2018-09-01 NOTE — Telephone Encounter (Signed)
Patient called regarding his medication Carbidopa Levodopa and that [facility] on [address] Faxed over a request. Thanks

## 2018-09-01 NOTE — Telephone Encounter (Signed)
RX sent to pharmacy  

## 2018-09-11 ENCOUNTER — Ambulatory Visit (HOSPITAL_COMMUNITY)
Admission: RE | Admit: 2018-09-11 | Discharge: 2018-09-11 | Disposition: A | Discharge status: Home / Self Care | Payer: Commercial Managed Care - PPO | Source: Ambulatory Visit | Attending: Hematology & Oncology | Admitting: Hematology & Oncology

## 2018-09-11 DIAGNOSIS — C8195 Hodgkin lymphoma, unspecified, lymph nodes of inguinal region and lower limb: Secondary | ICD-10-CM | POA: Diagnosis not present

## 2018-09-11 DIAGNOSIS — C8190 Hodgkin lymphoma, unspecified, unspecified site: Secondary | ICD-10-CM | POA: Diagnosis not present

## 2018-09-11 DIAGNOSIS — R222 Localized swelling, mass and lump, trunk: Secondary | ICD-10-CM | POA: Insufficient documentation

## 2018-09-11 LAB — GLUCOSE, CAPILLARY: Glucose-Capillary: 99 mg/dL (ref 70–99)

## 2018-09-11 MED ORDER — FLUDEOXYGLUCOSE F - 18 (FDG) INJECTION
8.7100 | Freq: Once | INTRAVENOUS | Status: AC
  Administered 2018-09-11: 8.71 via INTRAVENOUS

## 2018-09-12 ENCOUNTER — Encounter: Payer: Self-pay | Admitting: Hematology & Oncology

## 2018-09-15 ENCOUNTER — Encounter: Payer: Self-pay | Admitting: Family Medicine

## 2018-09-15 ENCOUNTER — Encounter: Payer: Self-pay | Admitting: Hematology & Oncology

## 2018-09-15 ENCOUNTER — Other Ambulatory Visit: Payer: Self-pay | Admitting: Hematology

## 2018-09-15 ENCOUNTER — Telehealth: Payer: Self-pay

## 2018-09-15 DIAGNOSIS — C8195 Hodgkin lymphoma, unspecified, lymph nodes of inguinal region and lower limb: Secondary | ICD-10-CM

## 2018-09-15 NOTE — Telephone Encounter (Signed)
I looked at his PET- it does look like there may be disease progression.  Called oncology office - Dr. Maylon Peppers had already spoken with him by the time I got this message

## 2018-09-15 NOTE — Telephone Encounter (Signed)
Copied from [place] [id number]. Topic: General - Inquiry >> Sep 15, 2018  1:43 PM [name], NT wrote: Reason for CRM: Patient calling to see if there is anything Dr Lorelei Pont could do. States that he had a PET scan on 09/11/18 that was ordered by Dr Marin Olp. States that Dr Marin Olp is on vacation for a week. Patient would like to know if Dr Lorelei Pont could call over and offer to give him the results or have someone from Dr Antonieta Pert office to call him. Please advise.  CB$: [phone]

## 2018-09-15 NOTE — Telephone Encounter (Signed)
See previous note

## 2018-09-18 ENCOUNTER — Encounter: Payer: Self-pay | Admitting: Hematology & Oncology

## 2018-09-18 ENCOUNTER — Other Ambulatory Visit: Payer: Self-pay | Admitting: General Surgery

## 2018-09-18 ENCOUNTER — Encounter: Payer: Self-pay | Admitting: General Surgery

## 2018-09-18 ENCOUNTER — Other Ambulatory Visit: Payer: Self-pay

## 2018-09-18 DIAGNOSIS — G20 Parkinson's disease: Secondary | ICD-10-CM | POA: Diagnosis not present

## 2018-09-18 DIAGNOSIS — C8190 Hodgkin lymphoma, unspecified, unspecified site: Secondary | ICD-10-CM | POA: Diagnosis not present

## 2018-09-18 DIAGNOSIS — R222 Localized swelling, mass and lump, trunk: Secondary | ICD-10-CM

## 2018-09-18 DIAGNOSIS — Z9484 Stem cells transplant status: Secondary | ICD-10-CM | POA: Diagnosis not present

## 2018-09-18 HISTORY — DX: Localized swelling, mass and lump, trunk: R22.2

## 2018-09-18 NOTE — H&P (Signed)
Teresita Madura Location: [facility] Patient #: [id number] DOB: January 29, 1961 Married / Language: English / Race: White Male        History of Present Illness       The patient is a 57 year old male who presents with a complaint of recurrent lymphoma. This is a 56 year old man with a long history of Hodgkin's lymphoma with multiple interventions and multiple recurrences. He is referred to me for consideration of tissue biopsy to see if he has yet another recurrence.      Dr. Margot Chimes performed inguinal lymph node biopsy in 2013 diagnosis is Hodgkin's disease. He was treated with standard chemo- therapy and subsequently recurred. 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 He relapsed. He received CAR T therapy at [facility] March 06, 2016. He relapsed in his spine had radiation therapy to the spine. Recently developed night sweats which led to another PET/CT. CT scan shows a 1 cm subcutaneous nodule in the right lateral chest wall up high below the axilla. There is also hypermetabolism in the lateral right fourth rib. There is some subcutaneous nodules in each gluteal region but they are not hypermetabolic. There is some hypermetabolism in the anterior mediastinum and anterior juxta diaphragmatic fat.     Past history is notable for the Hodgkin's lymphoma with multiple recurrences and Parkinson's disease and hyperlipidemia Family history reveals mother died of ovarian cancer. Father died of aortic dissection Social history reveals he is married. His wife is with him throughout the encounter today. He works full-time for [employer]. Denies tobacco. Takes alcohol occasionally. Have to signs.       After repeated exams I can feel a mobile 1 cm nodule in the right lateral chest wall just below the axilla. To my review this correlates with the hypermetabolic nodule seen on PET/CT. I think this can be excised under monitored sedation. If we  can get a diagnosis here that might avoid mediastinoscopy or thoracoscopy. If we cannot get a diagnosis and we'll have to discuss whether to refer him to Dr. Roxan Hockey again for intrathoracic biopsy.      He will be scheduled for excision of right chest wall mass under monitored sedation as soon as possible I discussed the indications, details, techniques, numerous risk of the surgery. He is aware of the risk of bleeding, infection, nerve damage and chronic pain, negative biopsy requiring further workup. He understands all these issues well. All of his questions were answered. He and his wife agree with this plan.    Sentinel Lymph Node Biopsy  Vasectomy   Diagnostic Studies History  Colonoscopy  5-10 years ago  Allergies No Known Drug Allergies Allergies Reconciled   Medication History  Carbidopa-Levodopa (25-100MG  Tablet, Oral) Active. Sildenafil Citrate (20MG  Tablet, Oral) Active. Pramipexole Dihydrochloride (1MG  Tablet, Oral) Active. Vitamin D (1000UNIT Tablet, Oral) Active.  Social History Alcohol use  Moderate alcohol use. Caffeine use  Coffee. No drug use  Tobacco use  Never smoker.  Family History  Cancer  Brother. Colon Polyps  Brother. Hypertension  Brother, Father. Ovarian Cancer  Mother.  Other Problems  Cancer  Cerebrovascular Accident     Review of Systems  General Present- Night Sweats. Not Present- Appetite Loss, Chills, Fatigue, Fever, Weight Gain and Weight Loss. Skin Not Present- Change in Wart/Mole, Dryness, Hives, Jaundice, New Lesions, Non-Healing Wounds, Rash and Ulcer. HEENT Present- Hearing Loss and Ringing in the Ears. Not Present- Earache, Hoarseness, Nose Bleed,  Oral Ulcers, Seasonal Allergies, Sinus Pain, Sore Throat, Visual Disturbances, Wears glasses/contact lenses and Yellow Eyes. Respiratory Not Present- Bloody sputum, Chronic Cough, Difficulty Breathing, Snoring and Wheezing. Cardiovascular Not Present- Chest Pain,  Difficulty Breathing Lying Down, Leg Cramps, Palpitations, Rapid Heart Rate, Shortness of Breath and Swelling of Extremities. Gastrointestinal Not Present- Abdominal Pain, Bloating, Bloody Stool, Change in Bowel Habits, Chronic diarrhea, Constipation, Difficulty Swallowing, Excessive gas, Gets full quickly at meals, Hemorrhoids, Indigestion, Nausea, Rectal Pain and Vomiting. Male Genitourinary Not Present- Blood in Urine, Change in Urinary Stream, Frequency, Impotence, Nocturia, Painful Urination, Urgency and Urine Leakage. Musculoskeletal Not Present- Back Pain, Joint Pain, Joint Stiffness, Muscle Pain, Muscle Weakness and Swelling of Extremities. Neurological Present- Trouble walking. Not Present- Decreased Memory, Fainting, Headaches, Numbness, Seizures, Tingling, Tremor and Weakness. Psychiatric Not Present- Anxiety, Bipolar, Change in Sleep Pattern, Depression, Fearful and Frequent crying. Endocrine Not Present- Cold Intolerance, Excessive Hunger, Hair Changes, Heat Intolerance, Hot flashes and New Diabetes. Hematology Not Present- Blood Thinners, Easy Bruising, Excessive bleeding, Gland problems, HIV and Persistent Infections.  Vitals  Weight: 181.38 lb Height: 69in Body Surface Area: 1.98 m Body Mass Index: 26.78 kg/m  Temp.: 98.66F(Tympanic)  Pulse: 99 (Regular)  Resp.: 18 (Unlabored)  P.OX: 99% (Room air) BP: 130/82 (Sitting, Left Arm, Standard)       Physical Exam  General Mental Status-Alert. General Appearance-Consistent with stated age. Hydration-Well hydrated. Voice-Normal. Note: Very nice man. Very intelligent with good insight and knowledge-based. Anxious.   Head and Neck Head-normocephalic, atraumatic with no lesions or palpable masses. Trachea-midline. Thyroid Gland Characteristics - normal size and consistency.  Eye Eyeball - Bilateral-Extraocular movements intact. Sclera/Conjunctiva - Bilateral-No scleral icterus.  Chest and  Lung Exam Chest and lung exam reveals -quiet, even and easy respiratory effort with no use of accessory muscles and on auscultation, normal breath sounds, no adventitious sounds and normal vocal resonance. Inspection Chest Wall - Normal. Back - normal. Note: 1 cm nodule palpable when he is in the lateral position and his arm raised. This is below the hairline of the axilla and is more on the chest wall then in the axilla. Slightly anterior to mid axillary line. Slightly above scapular tip. Thoracoscopy scar on the right. Thoracoscopy scar and chest tube site on the left. Lungs sound clear Well-healed right infraclavicular incision from prior port insertion. Port has been removed.   Cardiovascular Cardiovascular examination reveals -normal heart sounds, regular rate and rhythm with no murmurs and normal pedal pulses bilaterally.  Abdomen Inspection Inspection of the abdomen reveals - No Hernias. Skin - Scar - no surgical scars. Palpation/Percussion Palpation and Percussion of the abdomen reveal - Soft, Non Tender, No Rebound tenderness, No Rigidity (guarding) and No hepatosplenomegaly. Auscultation Auscultation of the abdomen reveals - Bowel sounds normal. Note: No mass or adenopathy or organomegaly noted.   Neurologic Neurologic evaluation reveals -alert and oriented x 3 with no impairment of recent or remote memory. Mental Status-Normal.  Musculoskeletal Normal Exam - Left-Upper Extremity Strength Normal and Lower Extremity Strength Normal. Normal Exam - Right-Upper Extremity Strength Normal and Lower Extremity Strength Normal.  Lymphatic Head & Neck  General Head & Neck Lymphatics: Bilateral - Description - Normal. Axillary  General Axillary Region: Bilateral - Description - Normal. Tenderness - Non Tender. Femoral & Inguinal  Generalized Femoral & Inguinal Lymphatics: Bilateral - Description - Normal. Tenderness - Non Tender. Note: Careful exam of cervical  area, axillary areas, and inguinal areas are negative for adenopathy.     Assessment & Plan  RECURRENT HODGKIN'S LYMPHOMA OF LYMPH NODE (C81.90)    You have developed night sweats and there is a concern about recurrent Hodgkin's lymphoma Your PET/CT shows a small nodule in the right lateral chest wall, a small nodule on the right anterior diaphragm, and a small nodules in the anterior mediastinum. We were able to palpate a nodule in your right lateral chest wall just below the axillary lymph node area This correlates with the PET scan Excising this area would be the simplest way to try to confirm whether or not you have recurrent lymphoma There is a possibility that this could be a false positive finding and we might have to go further and involve Dr. Roxan Hockey to biopsy the intrathoracic areas. We should try to make a diagnosis with this area first, however  you will be scheduled for excision right lateral chest wall nodule under monitored sedation as soon as possible We have discussed the indications, techniques, and risk of the surgery in detail with you and your wife  PARKINSONS DISEASE (G20) HISTORY OF AUTOLOGOUS STEM CELL TRANSPLANT (Z94.84)    Edsel Petrin. Dalbert Batman, M.D., [facility] General and Minimally invasive Surgery Breast and Colorectal Surgery Office:   [phone] Pager:   [phone]

## 2018-09-18 NOTE — Progress Notes (Signed)
Spoke with patient for pre-op phone call. Denies any cardiac history, HTN or DM. Patient advised of ERAS diet, and can have clear liquids up until 3 hours prior of surgery.

## 2018-09-19 ENCOUNTER — Encounter (HOSPITAL_BASED_OUTPATIENT_CLINIC_OR_DEPARTMENT_OTHER)
Admission: RE | Disposition: A | Discharge status: Home / Self Care | Payer: Self-pay | Source: Ambulatory Visit | Attending: General Surgery

## 2018-09-19 ENCOUNTER — Ambulatory Visit: Payer: Commercial Managed Care - PPO | Admitting: Neurology

## 2018-09-19 ENCOUNTER — Ambulatory Visit (HOSPITAL_BASED_OUTPATIENT_CLINIC_OR_DEPARTMENT_OTHER): Payer: Commercial Managed Care - PPO | Admitting: Certified Registered Nurse Anesthetist

## 2018-09-19 ENCOUNTER — Other Ambulatory Visit: Payer: Self-pay

## 2018-09-19 ENCOUNTER — Ambulatory Visit (HOSPITAL_BASED_OUTPATIENT_CLINIC_OR_DEPARTMENT_OTHER)
Admission: RE | Admit: 2018-09-19 | Discharge: 2018-09-19 | Disposition: A | Discharge status: Home / Self Care | Payer: Commercial Managed Care - PPO | Source: Ambulatory Visit | Attending: General Surgery | Admitting: General Surgery

## 2018-09-19 ENCOUNTER — Encounter (HOSPITAL_BASED_OUTPATIENT_CLINIC_OR_DEPARTMENT_OTHER): Payer: Self-pay | Admitting: *Deleted

## 2018-09-19 DIAGNOSIS — C8192 Hodgkin lymphoma, unspecified, intrathoracic lymph nodes: Secondary | ICD-10-CM | POA: Insufficient documentation

## 2018-09-19 DIAGNOSIS — Z9484 Stem cells transplant status: Secondary | ICD-10-CM | POA: Diagnosis not present

## 2018-09-19 DIAGNOSIS — F419 Anxiety disorder, unspecified: Secondary | ICD-10-CM | POA: Insufficient documentation

## 2018-09-19 DIAGNOSIS — Z79899 Other long term (current) drug therapy: Secondary | ICD-10-CM | POA: Insufficient documentation

## 2018-09-19 DIAGNOSIS — I10 Essential (primary) hypertension: Secondary | ICD-10-CM | POA: Diagnosis not present

## 2018-09-19 DIAGNOSIS — E785 Hyperlipidemia, unspecified: Secondary | ICD-10-CM | POA: Insufficient documentation

## 2018-09-19 DIAGNOSIS — Z8673 Personal history of transient ischemic attack (TIA), and cerebral infarction without residual deficits: Secondary | ICD-10-CM | POA: Diagnosis not present

## 2018-09-19 DIAGNOSIS — C8179 Other classical Hodgkin lymphoma, extranodal and solid organ sites: Secondary | ICD-10-CM | POA: Diagnosis not present

## 2018-09-19 DIAGNOSIS — R222 Localized swelling, mass and lump, trunk: Secondary | ICD-10-CM | POA: Diagnosis present

## 2018-09-19 DIAGNOSIS — C8590 Non-Hodgkin lymphoma, unspecified, unspecified site: Secondary | ICD-10-CM | POA: Diagnosis not present

## 2018-09-19 DIAGNOSIS — C8190 Hodgkin lymphoma, unspecified, unspecified site: Secondary | ICD-10-CM | POA: Diagnosis not present

## 2018-09-19 DIAGNOSIS — G20 Parkinson's disease: Secondary | ICD-10-CM | POA: Insufficient documentation

## 2018-09-19 HISTORY — DX: Localized swelling, mass and lump, trunk: R22.2

## 2018-09-19 HISTORY — PX: MASS EXCISION: SHX2000

## 2018-09-19 SURGERY — EXCISION MASS
Anesthesia: Monitor Anesthesia Care | Laterality: Right

## 2018-09-19 MED ORDER — FENTANYL CITRATE (PF) 100 MCG/2ML IJ SOLN
INTRAMUSCULAR | Status: DC | PRN
  Administered 2018-09-19: 100 ug via INTRAVENOUS

## 2018-09-19 MED ORDER — HYDROCODONE-ACETAMINOPHEN 5-325 MG PO TABS: 1.0000 | ORAL_TABLET | Freq: Four times a day (QID) | ORAL | 0 refills | Status: DC | PRN

## 2018-09-19 MED ORDER — ONDANSETRON HCL 4 MG/2ML IJ SOLN: 4.0000 mg | Freq: Once | INTRAMUSCULAR | Status: DC | PRN

## 2018-09-19 MED ORDER — CHLORHEXIDINE GLUCONATE CLOTH 2 % EX PADS: 6.0000 | MEDICATED_PAD | Freq: Once | CUTANEOUS | Status: DC

## 2018-09-19 MED ORDER — LACTATED RINGERS IV SOLN
INTRAVENOUS | Status: DC
  Administered 2018-09-19: 13:00:00 via INTRAVENOUS

## 2018-09-19 MED ORDER — GABAPENTIN 300 MG PO CAPS
300.0000 mg | ORAL_CAPSULE | ORAL | Status: AC
  Administered 2018-09-19: 300 mg via ORAL

## 2018-09-19 MED ORDER — CEFAZOLIN SODIUM-DEXTROSE 2-4 GM/100ML-% IV SOLN
2.0000 g | INTRAVENOUS | Status: AC
  Administered 2018-09-19: 2 g via INTRAVENOUS

## 2018-09-19 MED ORDER — CEFAZOLIN SODIUM-DEXTROSE 2-4 GM/100ML-% IV SOLN
INTRAVENOUS | Status: AC
  Filled 2018-09-19: qty 100

## 2018-09-19 MED ORDER — ACETAMINOPHEN 500 MG PO TABS
ORAL_TABLET | ORAL | Status: AC
  Filled 2018-09-19: qty 2

## 2018-09-19 MED ORDER — SODIUM BICARBONATE 4 % IV SOLN
INTRAVENOUS | Status: AC
  Filled 2018-09-19: qty 5

## 2018-09-19 MED ORDER — CELECOXIB 200 MG PO CAPS
200.0000 mg | ORAL_CAPSULE | ORAL | Status: AC
  Administered 2018-09-19: 200 mg via ORAL

## 2018-09-19 MED ORDER — ACETAMINOPHEN 160 MG/5ML PO SOLN: 325.0000 mg | ORAL | Status: DC | PRN

## 2018-09-19 MED ORDER — OXYCODONE HCL 5 MG PO TABS: 5.0000 mg | ORAL_TABLET | Freq: Once | ORAL | Status: DC | PRN

## 2018-09-19 MED ORDER — BUPIVACAINE-EPINEPHRINE 0.25% -1:200000 IJ SOLN
INTRAMUSCULAR | Status: DC | PRN
  Administered 2018-09-19: 6 mL

## 2018-09-19 MED ORDER — ACETAMINOPHEN 650 MG RE SUPP: 650.0000 mg | RECTAL | Status: DC | PRN

## 2018-09-19 MED ORDER — ACETAMINOPHEN 325 MG PO TABS: 650.0000 mg | ORAL_TABLET | ORAL | Status: DC | PRN

## 2018-09-19 MED ORDER — SODIUM CHLORIDE 0.9% FLUSH: 3.0000 mL | INTRAVENOUS | Status: DC | PRN

## 2018-09-19 MED ORDER — ACETAMINOPHEN 500 MG PO TABS
1000.0000 mg | ORAL_TABLET | ORAL | Status: AC
  Administered 2018-09-19: 1000 mg via ORAL

## 2018-09-19 MED ORDER — OXYCODONE HCL 5 MG/5ML PO SOLN: 5.0000 mg | Freq: Once | ORAL | Status: DC | PRN

## 2018-09-19 MED ORDER — MIDAZOLAM HCL 5 MG/5ML IJ SOLN
INTRAMUSCULAR | Status: DC | PRN
  Administered 2018-09-19: 2 mg via INTRAVENOUS

## 2018-09-19 MED ORDER — LACTATED RINGERS IV SOLN: INTRAVENOUS | Status: DC

## 2018-09-19 MED ORDER — PROPOFOL 500 MG/50ML IV EMUL
INTRAVENOUS | Status: DC | PRN
  Administered 2018-09-19: 100 ug/kg/min via INTRAVENOUS

## 2018-09-19 MED ORDER — ACETAMINOPHEN 325 MG PO TABS: 325.0000 mg | ORAL_TABLET | ORAL | Status: DC | PRN

## 2018-09-19 MED ORDER — BUPIVACAINE-EPINEPHRINE 0.25% -1:200000 IJ SOLN
INTRAMUSCULAR | Status: AC
  Filled 2018-09-19: qty 1

## 2018-09-19 MED ORDER — OXYCODONE HCL 5 MG PO TABS: 5.0000 mg | ORAL_TABLET | ORAL | Status: DC | PRN

## 2018-09-19 MED ORDER — GABAPENTIN 300 MG PO CAPS
ORAL_CAPSULE | ORAL | Status: AC
  Filled 2018-09-19: qty 1

## 2018-09-19 MED ORDER — SODIUM CHLORIDE 0.9% FLUSH: 3.0000 mL | Freq: Two times a day (BID) | INTRAVENOUS | Status: DC

## 2018-09-19 MED ORDER — FENTANYL CITRATE (PF) 100 MCG/2ML IJ SOLN: 25.0000 ug | INTRAMUSCULAR | Status: DC | PRN

## 2018-09-19 MED ORDER — SODIUM CHLORIDE 0.9 % IV SOLN: 250.0000 mL | INTRAVENOUS | Status: DC | PRN

## 2018-09-19 MED ORDER — LIDOCAINE HCL (CARDIAC) PF 100 MG/5ML IV SOSY
PREFILLED_SYRINGE | INTRAVENOUS | Status: DC | PRN
  Administered 2018-09-19: 30 mg via INTRAVENOUS

## 2018-09-19 MED ORDER — MIDAZOLAM HCL 2 MG/2ML IJ SOLN
INTRAMUSCULAR | Status: AC
  Filled 2018-09-19: qty 2

## 2018-09-19 MED ORDER — ONDANSETRON HCL 4 MG/2ML IJ SOLN
INTRAMUSCULAR | Status: DC | PRN
  Administered 2018-09-19: 4 mg via INTRAVENOUS

## 2018-09-19 MED ORDER — FENTANYL CITRATE (PF) 100 MCG/2ML IJ SOLN
INTRAMUSCULAR | Status: AC
  Filled 2018-09-19: qty 2

## 2018-09-19 MED ORDER — CELECOXIB 200 MG PO CAPS
ORAL_CAPSULE | ORAL | Status: AC
  Filled 2018-09-19: qty 1

## 2018-09-19 MED ORDER — MEPERIDINE HCL 25 MG/ML IJ SOLN: 6.2500 mg | INTRAMUSCULAR | Status: DC | PRN

## 2018-09-19 SURGICAL SUPPLY — 54 items
BANDAGE ACE 6X5 VEL STRL LF (GAUZE/BANDAGES/DRESSINGS) IMPLANT
BENZOIN TINCTURE PRP APPL 2/3 (GAUZE/BANDAGES/DRESSINGS) IMPLANT
BLADE HEX COATED 2.75 (ELECTRODE) ×3 IMPLANT
BLADE SURG 15 STRL LF DISP TIS (BLADE) ×2 IMPLANT
BLADE SURG 15 STRL SS (BLADE) ×4
CANISTER SUCT 1200ML W/VALVE (MISCELLANEOUS) ×3 IMPLANT
CHLORAPREP W/TINT 26ML (MISCELLANEOUS) ×3 IMPLANT
CLOSURE WOUND 1/2 X4 (GAUZE/BANDAGES/DRESSINGS)
COVER BACK TABLE 60X90IN (DRAPES) ×3 IMPLANT
COVER MAYO STAND STRL (DRAPES) ×3 IMPLANT
COVER WAND RF STERILE (DRAPES) IMPLANT
DECANTER SPIKE VIAL GLASS SM (MISCELLANEOUS) IMPLANT
DERMABOND ADVANCED (GAUZE/BANDAGES/DRESSINGS)
DERMABOND ADVANCED .7 DNX12 (GAUZE/BANDAGES/DRESSINGS) IMPLANT
DRAPE LAPAROTOMY 100X72 PEDS (DRAPES) ×3 IMPLANT
DRAPE LAPAROTOMY TRNSV 102X78 (DRAPE) IMPLANT
DRAPE UTILITY XL STRL (DRAPES) ×3 IMPLANT
ELECT REM PT RETURN 9FT ADLT (ELECTROSURGICAL) ×3
ELECTRODE REM PT RTRN 9FT ADLT (ELECTROSURGICAL) ×1 IMPLANT
GAUZE 4X4 16PLY RFD (DISPOSABLE) IMPLANT
GAUZE SPONGE 4X4 12PLY STRL LF (GAUZE/BANDAGES/DRESSINGS) IMPLANT
GLOVE EUDERMIC 7 POWDERFREE (GLOVE) ×3 IMPLANT
GOWN STRL REUS W/ TWL LRG LVL3 (GOWN DISPOSABLE) ×1 IMPLANT
GOWN STRL REUS W/ TWL XL LVL3 (GOWN DISPOSABLE) ×1 IMPLANT
GOWN STRL REUS W/TWL LRG LVL3 (GOWN DISPOSABLE) ×2
GOWN STRL REUS W/TWL XL LVL3 (GOWN DISPOSABLE) ×2
NEEDLE HYPO 22GX1.5 SAFETY (NEEDLE) IMPLANT
NEEDLE HYPO 25X1 1.5 SAFETY (NEEDLE) ×3 IMPLANT
NS IRRIG 1000ML POUR BTL (IV SOLUTION) ×3 IMPLANT
PACK BASIN DAY SURGERY FS (CUSTOM PROCEDURE TRAY) ×3 IMPLANT
PENCIL BUTTON HOLSTER BLD 10FT (ELECTRODE) ×3 IMPLANT
SHEET MEDIUM DRAPE 40X70 STRL (DRAPES) IMPLANT
SLEEVE SCD COMPRESS KNEE MED (MISCELLANEOUS) IMPLANT
SPONGE LAP 4X18 RFD (DISPOSABLE) ×3 IMPLANT
STAPLER VISISTAT 35W (STAPLE) IMPLANT
STRIP CLOSURE SKIN 1/2X4 (GAUZE/BANDAGES/DRESSINGS) IMPLANT
SUT ETHILON 4 0 PS 2 18 (SUTURE) IMPLANT
SUT MNCRL AB 4-0 PS2 18 (SUTURE) IMPLANT
SUT SILK 2 0 SH (SUTURE) ×3 IMPLANT
SUT VIC AB 2-0 SH 27 (SUTURE)
SUT VIC AB 2-0 SH 27XBRD (SUTURE) IMPLANT
SUT VIC AB 3-0 FS2 27 (SUTURE) IMPLANT
SUT VIC AB 4-0 P-3 18XBRD (SUTURE) IMPLANT
SUT VIC AB 4-0 P3 18 (SUTURE)
SUT VICRYL 3-0 CR8 SH (SUTURE) ×3 IMPLANT
SUT VICRYL 4-0 PS2 18IN ABS (SUTURE) IMPLANT
SYR 10ML LL (SYRINGE) ×3 IMPLANT
SYR BULB 3OZ (MISCELLANEOUS) IMPLANT
TAPE HYPAFIX 4 X10 (GAUZE/BANDAGES/DRESSINGS) IMPLANT
TOWEL GREEN STERILE FF (TOWEL DISPOSABLE) ×3 IMPLANT
TOWEL OR NON WOVEN STRL DISP B (DISPOSABLE) ×3 IMPLANT
TUBE CONNECTING 20'X1/4 (TUBING) ×1
TUBE CONNECTING 20X1/4 (TUBING) ×2 IMPLANT
YANKAUER SUCT BULB TIP NO VENT (SUCTIONS) ×3 IMPLANT

## 2018-09-19 NOTE — Interval H&P Note (Signed)
History and Physical Interval Note:  09/19/2018 1:55 PM  Brian [name]  has presented today for surgery, with the diagnosis of Lymphoma  The various methods of treatment have been discussed with the patient and family. After consideration of risks, benefits and other options for treatment, the patient has consented to  Procedure(s): EXCISION OF RIGHT CHEST WALL MASS ERAS PATHWAY (Right) as a surgical intervention .  The patient's history has been reviewed, patient examined, no change in status, stable for surgery.  I have reviewed the patient's chart and labs.  Questions were answered to the patient's satisfaction.     [name]

## 2018-09-19 NOTE — Op Note (Signed)
Patient Name:           Brian [name]   Date of Surgery:        09/19/2018  Pre op Diagnosis:      Right chest wall mass, likely malignant, rule out recurrent lymphoma  Post op Diagnosis:    Same  Procedure:                 Excision 1 cm right chest wall mass  Surgeon:                     Edsel Petrin. Dalbert Batman, M.D., FACS  Assistant:                      OR staff  Operative Indications:  This is a 57 year old man with a long history of Hodgkin's lymphoma with multiple interventions and multiple recurrences. He is brought to the operating room for excision of a right chest wall mass to rule out recurrent lymphoma      Dr. Margot Chimes performed inguinal lymph node biopsy in 2013 diagnosis is Hodgkin's disease. He was treated with standard chemo- therapy and subsequently recurred. 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 He relapsed. He received CAR T therapy at [facility] March 06, 2016. He relapsed in his spine had radiation therapy to the spine. Recently developed night sweats which led to another PET/CT. CT scan shows a 1 cm subcutaneous nodule in the right lateral chest wall up high below the axilla. There is also hypermetabolism in the lateral right fourth rib. There is some subcutaneous nodules in each gluteal region but they are not hypermetabolic. There is some hypermetabolism in the anterior mediastinum and anterior juxta diaphragmatic fat.     Past history is notable for the Hodgkin's lymphoma with multiple recurrences and Parkinson's disease and hyperlipidemia       After repeated exams I can feel a mobile 1 cm nodule in the right lateral chest wall just below the axilla.       He will be scheduled for excision of right chest wall mass under monitored sedation as soon as possible  Operative Findings:       There was a 1 cm deep mass in the anterior axillary line, at the hairline, near but not into the axillary space.  This required dissection all the way down to the muscle  itself and layered closure.  Procedure in Detail:          The patient was placed in the left lateral decubitus position on the operating table.  He was monitored and sedated by the anesthesia department.  His left arm was suspended above to expose the area.  The right chest wall was prepped and draped in a sterile fashion.  Surgical timeout was performed.  0.5% Marcaine with epinephrine was used as local infiltration anesthetic.  I could palpate the mass and made a transverse incision about 3 cm in length.  Dissection was carried down through the subcutaneous tissue.  I found that the mass was on top of the muscle fascia but was not invading the muscle or the rib.  The mass was excised and sent fresh for lymphoma work-up with appropriate history attached.  Stasis was excellent.  The wound was irrigated.  The subcutaneous tissue was closed with 3-0 Vicryl sutures and the skin closed with a running subcuticular 4-0 Monocryl and Dermabond.  Patient tolerated the procedure well was taken to PACU in stable condition.  EBL 10 cc or less.  Counts correct.  Complications none.   Addendum: I logged onto the [employer] and reviewed his prescription medication history     Keighan Amezcua M. Dalbert Batman, M.D., FACS General and Minimally Invasive Surgery Breast and Colorectal Surgery  09/19/2018 2:54 PM

## 2018-09-19 NOTE — Anesthesia Procedure Notes (Signed)
Procedure Name: MAC Date/Time: 09/19/2018 2:39 PM Performed by: Signe Colt, CRNA Pre-anesthesia Checklist: Patient identified, Emergency Drugs available, Suction available, Patient being monitored and Timeout performed Patient Re-evaluated:Patient Re-evaluated prior to induction Oxygen Delivery Method: Simple face mask

## 2018-09-19 NOTE — Transfer of Care (Signed)
Immediate Anesthesia Transfer of Care Note  Patient: Brian [name]  Procedure(s) Performed: EXCISION OF RIGHT CHEST WALL MASS ERAS PATHWAY (Right )  Patient Location: PACU  Anesthesia Type:MAC  Level of Consciousness: awake, alert , oriented and patient cooperative  Airway & Oxygen Therapy: Patient Spontanous Breathing and Patient connected to face mask oxygen  Post-op Assessment: Report given to RN and Post -op Vital signs reviewed and stable  Post vital signs: Reviewed and stable  Last Vitals:  Vitals Value Taken Time  BP 123/87 09/19/2018  2:54 PM  Temp    Pulse 74 09/19/2018  2:56 PM  Resp 16 09/19/2018  2:56 PM  SpO2 100 % 09/19/2018  2:56 PM  Vitals shown include unvalidated device data.  Last Pain:  Vitals:   09/19/18 1249  TempSrc: Oral  PainSc: 2       Patients Stated Pain Goal: 2 (09/64/38 3818)  Complications: No apparent anesthesia complications

## 2018-09-19 NOTE — Discharge Instructions (Signed)
°  Post Anesthesia Home Care Instructions  Activity: Get plenty of rest for the remainder of the day. A responsible individual must stay with you for 24 hours following the procedure.  For the next 24 hours, DO NOT: -Drive a car -Paediatric nurse -Drink alcoholic beverages -Take any medication unless instructed by your physician -Make any legal decisions or sign important papers.  Meals: Start with liquid foods such as gelatin or soup. Progress to regular foods as tolerated. Avoid greasy, spicy, heavy foods. If nausea and/or vomiting occur, drink only clear liquids until the nausea and/or vomiting subsides. Call your physician if vomiting continues.  Special Instructions/Symptoms: Your throat may feel dry or sore from the anesthesia or the breathing tube placed in your throat during surgery. If this causes discomfort, gargle with warm salt water. The discomfort should disappear within 24 hours.  If you had a scopolamine patch placed behind your ear for the management of post- operative nausea and/or vomiting:  1. The medication in the patch is effective for 72 hours, after which it should be removed.  Wrap patch in a tissue and discard in the trash. Wash hands thoroughly with soap and water. 2. You may remove the patch earlier than 72 hours if you experience unpleasant side effects which may include dry mouth, dizziness or visual disturbances. 3. Avoid touching the patch. Wash your hands with soap and water after contact with the patch.      Ice pack to wound for 10 minutes at a time, often known for 24 hours Okay to shower tomorrow No tub baths or swimming pools  The clear plastic superglue will wear off in about 3 weeks You may drive your car in a day or 2 No sports or strenuous activities  See Dr. Dalbert Batman in the office in about 2 weeks  We should be able to call the pathology report to you no later than next Wednesday Call me if there are any concerns

## 2018-09-19 NOTE — Anesthesia Preprocedure Evaluation (Addendum)
Anesthesia Evaluation  Patient identified by MRN, date of birth, ID band Patient awake    Reviewed: Allergy & Precautions, H&P , NPO status , Patient's Chart, lab work & pertinent test results, reviewed documented beta blocker date and time   Airway Mallampati: I  TM Distance: >3 FB Neck ROM: Full    Dental no notable dental hx. (+) Teeth Intact   Pulmonary neg pulmonary ROS,    Pulmonary exam normal breath sounds clear to auscultation       Cardiovascular hypertension, Pt. on home beta blockers Normal cardiovascular exam+ dysrhythmias  Rhythm:Regular Rate:Normal     Neuro/Psych PSYCHIATRIC DISORDERS Anxiety CVA negative psych ROS   GI/Hepatic negative GI ROS, Neg liver ROS,   Endo/Other  negative endocrine ROS  Renal/GU negative Renal ROS Bladder dysfunction: Bell's palsy       Musculoskeletal negative musculoskeletal ROS (+)   Abdominal   Peds  Hematology negative hematology ROS (+)   Anesthesia Other Findings   Reproductive/Obstetrics negative OB ROS                            Anesthesia Physical  Anesthesia Plan  ASA: III  Anesthesia Plan: MAC   Post-op Pain Management:    Induction: Intravenous  PONV Risk Score and Plan: 1 and Ondansetron and Treatment may vary due to age or medical condition  Airway Management Planned: Nasal Cannula, Natural Airway and Mask  Additional Equipment: Arterial line  Intra-op Plan:   Post-operative Plan: Extubation in OR  Informed Consent: I have reviewed the patients History and Physical, chart, labs and discussed the procedure including the risks, benefits and alternatives for the proposed anesthesia with the patient or authorized representative who has indicated his/her understanding and acceptance.   Dental advisory given  Plan Discussed with: CRNA, Anesthesiologist and Surgeon  Anesthesia Plan Comments:         Anesthesia  Quick Evaluation

## 2018-09-22 ENCOUNTER — Encounter (HOSPITAL_BASED_OUTPATIENT_CLINIC_OR_DEPARTMENT_OTHER): Payer: Self-pay | Admitting: General Surgery

## 2018-09-22 NOTE — Anesthesia Postprocedure Evaluation (Signed)
Anesthesia Post Note  Patient: Brian [name]  Procedure(s) Performed: EXCISION OF RIGHT CHEST WALL MASS ERAS PATHWAY (Right )     Patient location during evaluation: PACU Anesthesia Type: MAC Level of consciousness: awake and alert Pain management: pain level controlled Vital Signs Assessment: post-procedure vital signs reviewed and stable Respiratory status: spontaneous breathing, nonlabored ventilation, respiratory function stable and patient connected to nasal cannula oxygen Cardiovascular status: stable and blood pressure returned to baseline Postop Assessment: no apparent nausea or vomiting Anesthetic complications: no    Last Vitals:  Vitals:   09/19/18 1515 09/19/18 1537  BP: 124/85 (!) 134/96  Pulse: 79   Resp: (!) 21 16  Temp:  37.1 C  SpO2: 95% 100%    Last Pain:  Vitals:   09/19/18 1537  TempSrc:   PainSc: 0-No pain                 Keani Gotcher

## 2018-09-23 ENCOUNTER — Encounter: Payer: Self-pay | Admitting: Hematology & Oncology

## 2018-09-24 ENCOUNTER — Encounter: Payer: Self-pay | Admitting: Hematology & Oncology

## 2018-09-25 ENCOUNTER — Encounter: Payer: Self-pay | Admitting: Hematology & Oncology

## 2018-09-25 ENCOUNTER — Inpatient Hospital Stay: Payer: Commercial Managed Care - PPO | Attending: Hematology & Oncology | Admitting: Hematology & Oncology

## 2018-09-25 ENCOUNTER — Other Ambulatory Visit: Payer: Self-pay

## 2018-09-25 VITALS — BP 142/89 | HR 83 | Temp 98.3°F | Resp 18 | Wt 179.0 lb

## 2018-09-25 DIAGNOSIS — C8195 Hodgkin lymphoma, unspecified, lymph nodes of inguinal region and lower limb: Secondary | ICD-10-CM

## 2018-09-25 DIAGNOSIS — C8199 Hodgkin lymphoma, unspecified, extranodal and solid organ sites: Secondary | ICD-10-CM | POA: Diagnosis not present

## 2018-09-25 DIAGNOSIS — Z7189 Other specified counseling: Secondary | ICD-10-CM

## 2018-09-25 DIAGNOSIS — Z9484 Stem cells transplant status: Secondary | ICD-10-CM

## 2018-09-25 DIAGNOSIS — Z923 Personal history of irradiation: Secondary | ICD-10-CM | POA: Diagnosis not present

## 2018-09-25 HISTORY — DX: Other specified counseling: Z71.89

## 2018-09-25 NOTE — Progress Notes (Signed)
Hematology and Oncology Follow Up Visit  Brian [name] [id number] Sep 25, 1961 57 y.o. 09/25/2018   Principle Diagnosis:  Recurrent Hodgkin's Disease -  S/p CAR-T therapy  TIA-resolved  Current Therapy:  Nivolumab q month -- cycle #1 on 10/01/2018       Interim History:  [name] is in for an unscheduled visit.  Unfortunately, he now has tissue confirmed recurrent Hodgkin's disease.  He had a PET scan that was done for routine follow-up.  This was done on 09/11/2018.  The CT scan showed interval development of metabolic soft tissue nodules in the mediastinum, right lateral chest wall, and right juxta diaphragmatic fat.  Also noted was hypermetabolism in the lateral right fourth rib.  He underwent a excisional biopsy on 09/19/2018.  The pathology report ([id number]) shows classic Hodgkin's lymphoma.  This is quite unfortunate.  He has been through somewhat therapy.  His last therapy was the CAR-T therapy at [facility] back in 2017.  He is still feeling well.  He does have some parkinsonian issues.  This seems to be improving.  He is quite active.  He is exercising.  He is eating well.  He did state that he has some night sweats and fevers recently.  He has had no bleeding.  There is been no headache.  He has had no change in bowel or bladder habits.  His weight is holding steady.  Currently, his performance status is ECOG 0.     Medications:  Current Outpatient Medications:  .  acetaminophen (TYLENOL) 325 MG tablet, Take 650 mg by mouth daily as needed for mild pain. , Disp: , Rfl:  .  carbidopa-levodopa (SINEMET IR) 25-100 MG tablet, Take 1 tablet by mouth 3 (three) times daily. 1 extra QD PRN, Disp: 360 tablet, Rfl: 1 .  Cholecalciferol (VITAMIN D-3) 1000 UNITS CAPS, Take 1,000 Units by mouth 4 (four) times a week. , Disp: , Rfl:  .  HYDROcodone-acetaminophen (NORCO) 5-325 MG tablet, Take 1-2 tablets by mouth every 6 (six) hours as needed for moderate pain or severe pain., Disp:  20 tablet, Rfl: 0 .  loperamide (IMODIUM A-D) 2 MG tablet, Take 4 mg by mouth as needed for diarrhea or loose stools., Disp: , Rfl:  .  pramipexole (MIRAPEX) 1 MG tablet, Take 1 tablet (1 mg total) by mouth 3 (three) times daily., Disp: 270 tablet, Rfl: 1 .  sildenafil (REVATIO) 20 MG tablet, Take 1-5 pills daily as needed prior to intercourse, Disp: 30 tablet, Rfl: 1  Allergies: No Known Allergies  Past Medical History, Surgical history, Social history, and Family History were reviewed and updated.  Review of Systems: Review of Systems  Neurological: Positive for tremors.  All other systems reviewed and are negative.    Physical Exam:  weight is 179 lb (81.2 kg). His oral temperature is 98.3 F (36.8 C). His blood pressure is 142/89 (abnormal) and his pulse is 83. His respiration is 18 and oxygen saturation is 100%.   Physical Exam  Constitutional: He is oriented to person, place, and time.  HENT:  Head: Normocephalic and atraumatic.  Mouth/Throat: Oropharynx is clear and moist.  Eyes: Pupils are equal, round, and reactive to light. EOM are normal.  Neck: Normal range of motion.  Cardiovascular: Normal rate, regular rhythm and normal heart sounds.  Pulmonary/Chest: Effort normal and breath sounds normal.  Abdominal: Soft. Bowel sounds are normal.  Musculoskeletal: Normal range of motion. He exhibits no edema, tenderness or deformity.  Lymphadenopathy:  He has no cervical adenopathy.  Neurological: He is alert and oriented to person, place, and time.  Skin: Skin is warm and dry. No rash noted. No erythema.  Psychiatric: He has a normal mood and affect. His behavior is normal. Judgment and thought content normal.  Vitals reviewed.   Lab Results  Component Value Date   WBC 5.6 08/28/2018   HGB 15.0 08/28/2018   HCT 45.2 08/28/2018   MCV 98.8 08/28/2018   PLT 255.0 08/28/2018     Chemistry      Component Value Date/Time   NA 140 01/16/2018 1503   NA 142 04/02/2017    NA 140 07/19/2016 1305   K 4.9 01/16/2018 1503   K 4.4 07/19/2016 1305   CL 103 01/16/2018 1503   CL 105 02/08/2016 1117   CL 107 03/25/2013 0828   CO2 28 01/16/2018 1503   CO2 26 07/19/2016 1305   BUN 24 (H) 01/16/2018 1503   BUN 21 04/02/2017   BUN 22.8 07/19/2016 1305   CREATININE 1.15 01/16/2018 1503   CREATININE 1.1 07/19/2016 1305   GLU 105 04/02/2017      Component Value Date/Time   CALCIUM 9.9 01/16/2018 1503   CALCIUM 9.8 07/19/2016 1305   ALKPHOS 79 01/16/2018 1503   ALKPHOS 76 07/19/2016 1305   AST 16 01/16/2018 1503   AST 18 07/19/2016 1305   ALT 5 01/16/2018 1503   ALT 14 07/19/2016 1305   BILITOT 0.7 01/16/2018 1503   BILITOT 0.70 07/19/2016 1305         Impression and Plan: Brian [name] is a 57 year old gentleman.  He has history of recurrent Hodgkin's disease. 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 He then had a recurrence after this.  He did undergo CAR-T therapy at [facility].  He did well with this.  Unfortunate, he had another relapse in his spine.  This was proven by biopsy.  He had radiation therapy for this.  Again, he apparently has disease that is quite resilient.  I really just feel bad that this Hodgkin's keeps coming back.  He has not yet had an allogeneic transplant.  This would certainly be a option for him although it would be a very tough one for him to get through.  He says that his doctors at [facility] know about this recurrence.  He says that he might be eligible for a another CAR-T therapy next year.  I think that we should consider him for immunotherapy.  I think this did work quite well.  I talked him at length.  I spent about 45 minutes with him.  All the time spent face-to-face.  We will try nivolumab.  We will try the monthly dosing of nivolumab.  I think that a bone marrow biopsy would not be a bad idea.  I think if we refer him to an academic [facility], they would want to have a bone marrow  biopsy done.  I just feel bad that [name] continues to deal with this Hodgkin's disease.  He is done a great job so far.  We will start treatment next week.  We will do 2 treatments and then we will see about a PET scan.  We will see about a bone marrow biopsy in a couple weeks.  I will plan to see him back when he has his second cycle of immunotherapy in early December.     [name], MD 10/31/20192:17 PM

## 2018-09-26 ENCOUNTER — Encounter: Payer: Self-pay | Admitting: Hematology & Oncology

## 2018-09-26 ENCOUNTER — Encounter: Payer: Self-pay | Admitting: *Deleted

## 2018-09-26 NOTE — Progress Notes (Signed)
Mychart message from patient requesting a copy of his path report to be faxed to [phone].  Dr. Marin Olp notified and OK to fax pt copy of report.  Pt notified.

## 2018-09-30 ENCOUNTER — Ambulatory Visit: Payer: Commercial Managed Care - PPO | Admitting: Neurology

## 2018-10-01 ENCOUNTER — Other Ambulatory Visit: Payer: Self-pay | Admitting: *Deleted

## 2018-10-01 DIAGNOSIS — C8195 Hodgkin lymphoma, unspecified, lymph nodes of inguinal region and lower limb: Secondary | ICD-10-CM

## 2018-10-02 ENCOUNTER — Inpatient Hospital Stay: Payer: Commercial Managed Care - PPO | Attending: Hematology & Oncology

## 2018-10-02 ENCOUNTER — Inpatient Hospital Stay: Payer: Commercial Managed Care - PPO

## 2018-10-02 VITALS — BP 146/91 | HR 79 | Temp 98.3°F | Resp 20

## 2018-10-02 DIAGNOSIS — Z79899 Other long term (current) drug therapy: Secondary | ICD-10-CM | POA: Diagnosis not present

## 2018-10-02 DIAGNOSIS — C8195 Hodgkin lymphoma, unspecified, lymph nodes of inguinal region and lower limb: Secondary | ICD-10-CM

## 2018-10-02 DIAGNOSIS — C8199 Hodgkin lymphoma, unspecified, extranodal and solid organ sites: Secondary | ICD-10-CM | POA: Insufficient documentation

## 2018-10-02 DIAGNOSIS — Z5111 Encounter for antineoplastic chemotherapy: Secondary | ICD-10-CM | POA: Diagnosis not present

## 2018-10-02 LAB — CMP (CANCER CENTER ONLY)
ALT: 18 U/L (ref 10–47)
ANION GAP: 6 (ref 5–15)
AST: 29 U/L (ref 11–38)
Albumin: 4 g/dL (ref 3.5–5.0)
Alkaline Phosphatase: 96 U/L — ABNORMAL HIGH (ref 26–84)
BILIRUBIN TOTAL: 0.8 mg/dL (ref 0.2–1.6)
BUN: 23 mg/dL — ABNORMAL HIGH (ref 7–22)
CALCIUM: 9.6 mg/dL (ref 8.0–10.3)
CO2: 30 mmol/L (ref 18–33)
Chloride: 107 mmol/L (ref 98–108)
Creatinine: 1.1 mg/dL (ref 0.60–1.20)
Glucose, Bld: 96 mg/dL (ref 73–118)
Potassium: 5.3 mmol/L — ABNORMAL HIGH (ref 3.3–4.7)
Sodium: 143 mmol/L (ref 128–145)
TOTAL PROTEIN: 7 g/dL (ref 6.4–8.1)

## 2018-10-02 LAB — CBC WITH DIFFERENTIAL (CANCER CENTER ONLY)
ABS IMMATURE GRANULOCYTES: 0.03 10*3/uL (ref 0.00–0.07)
Basophils Absolute: 0.1 10*3/uL (ref 0.0–0.1)
Basophils Relative: 1 %
EOS ABS: 0.1 10*3/uL (ref 0.0–0.5)
Eosinophils Relative: 2 %
HEMATOCRIT: 45.2 % (ref 39.0–52.0)
Hemoglobin: 14.5 g/dL (ref 13.0–17.0)
Immature Granulocytes: 1 %
LYMPHS ABS: 0.6 10*3/uL — AB (ref 0.7–4.0)
Lymphocytes Relative: 10 %
MCH: 31.5 pg (ref 26.0–34.0)
MCHC: 32.1 g/dL (ref 30.0–36.0)
MCV: 98.3 fL (ref 80.0–100.0)
MONOS PCT: 11 %
Monocytes Absolute: 0.6 10*3/uL (ref 0.1–1.0)
Neutro Abs: 4.1 10*3/uL (ref 1.7–7.7)
Neutrophils Relative %: 75 %
Platelet Count: 266 10*3/uL (ref 150–400)
RBC: 4.6 MIL/uL (ref 4.22–5.81)
RDW: 12.4 % (ref 11.5–15.5)
WBC Count: 5.4 10*3/uL (ref 4.0–10.5)
nRBC: 0 % (ref 0.0–0.2)

## 2018-10-02 MED ORDER — SODIUM CHLORIDE 0.9 % IV SOLN
Freq: Once | INTRAVENOUS | Status: AC
  Administered 2018-10-02: 12:00:00 via INTRAVENOUS
  Filled 2018-10-02: qty 250

## 2018-10-02 MED ORDER — SODIUM CHLORIDE 0.9 % IV SOLN
480.0000 mg | Freq: Once | INTRAVENOUS | Status: AC
  Administered 2018-10-02: 480 mg via INTRAVENOUS
  Filled 2018-10-02: qty 48

## 2018-10-02 NOTE — Patient Instructions (Signed)
Nivolumab injection What is this medicine? NIVOLUMAB (nye VOL ue mab) is a monoclonal antibody. It is used to treat melanoma, lung cancer, kidney cancer, head and neck cancer, Hodgkin lymphoma, urothelial cancer, colon cancer, and liver cancer. This medicine may be used for other purposes; ask your health care provider or pharmacist if you have questions. COMMON BRAND NAME(S): Opdivo What should I tell my health care provider before I take this medicine? They need to know if you have any of these conditions: -diabetes -immune system problems -kidney disease -liver disease -lung disease -organ transplant -stomach or intestine problems -thyroid disease -an unusual or allergic reaction to nivolumab, other medicines, foods, dyes, or preservatives -pregnant or trying to get pregnant -breast-feeding How should I use this medicine? This medicine is for infusion into a vein. It is given by a health care professional in a hospital or clinic setting. A special MedGuide will be given to you before each treatment. Be sure to read this information carefully each time. Talk to your pediatrician regarding the use of this medicine in children. While this drug may be prescribed for children as young as 12 years for selected conditions, precautions do apply. Overdosage: If you think you have taken too much of this medicine contact a poison control center or emergency room at once. NOTE: This medicine is only for you. Do not share this medicine with others. What if I miss a dose? It is important not to miss your dose. Call your doctor or health care professional if you are unable to keep an appointment. What may interact with this medicine? Interactions have not been studied. Give your health care provider a list of all the medicines, herbs, non-prescription drugs, or dietary supplements you use. Also tell them if you smoke, drink alcohol, or use illegal drugs. Some items may interact with your  medicine. This list may not describe all possible interactions. Give your health care provider a list of all the medicines, herbs, non-prescription drugs, or dietary supplements you use. Also tell them if you smoke, drink alcohol, or use illegal drugs. Some items may interact with your medicine. What should I watch for while using this medicine? This drug may make you feel generally unwell. Continue your course of treatment even though you feel ill unless your doctor tells you to stop. You may need blood work done while you are taking this medicine. Do not become pregnant while taking this medicine or for 5 months after stopping it. Women should inform their doctor if they wish to become pregnant or think they might be pregnant. There is a potential for serious side effects to an unborn child. Talk to your health care professional or pharmacist for more information. Do not breast-feed an infant while taking this medicine. What side effects may I notice from receiving this medicine? Side effects that you should report to your doctor or health care professional as soon as possible: -allergic reactions like skin rash, itching or hives, swelling of the face, lips, or tongue -black, tarry stools -blood in the urine -bloody or watery diarrhea -changes in vision -change in sex drive -changes in emotions or moods -chest pain -confusion -cough -decreased appetite -diarrhea -facial flushing -feeling faint or lightheaded -fever, chills -hair loss -hallucination, loss of contact with reality -headache -irritable -joint pain -loss of memory -muscle pain -muscle weakness -seizures -shortness of breath -signs and symptoms of high blood sugar such as dizziness; dry mouth; dry skin; fruity breath; nausea; stomach pain; increased hunger or thirst; increased   urination -signs and symptoms of kidney injury like trouble passing urine or change in the amount of urine -signs and symptoms of liver injury  like dark yellow or brown urine; general ill feeling or flu-like symptoms; light-colored stools; loss of appetite; nausea; right upper belly pain; unusually weak or tired; yellowing of the eyes or skin -stiff neck -swelling of the ankles, feet, hands -weight gain Side effects that usually do not require medical attention (report to your doctor or health care professional if they continue or are bothersome): -bone pain -constipation -tiredness -vomiting This list may not describe all possible side effects. Call your doctor for medical advice about side effects. You may report side effects to FDA at 1-800-FDA-1088. Where should I keep my medicine? This drug is given in a hospital or clinic and will not be stored at home. NOTE: This sheet is a summary. It may not cover all possible information. If you have questions about this medicine, talk to your doctor, pharmacist, or health care provider.  2018 Elsevier/Gold Standard (2016-08-20 17:49:34)  

## 2018-10-07 ENCOUNTER — Other Ambulatory Visit: Payer: Self-pay | Admitting: Radiology

## 2018-10-09 ENCOUNTER — Ambulatory Visit (HOSPITAL_COMMUNITY)
Admission: RE | Admit: 2018-10-09 | Discharge: 2018-10-09 | Disposition: A | Discharge status: Home / Self Care | Payer: Commercial Managed Care - PPO | Source: Ambulatory Visit | Attending: Hematology & Oncology | Admitting: Hematology & Oncology

## 2018-10-09 ENCOUNTER — Other Ambulatory Visit: Payer: Self-pay

## 2018-10-09 ENCOUNTER — Encounter (HOSPITAL_COMMUNITY): Payer: Self-pay

## 2018-10-09 DIAGNOSIS — F419 Anxiety disorder, unspecified: Secondary | ICD-10-CM | POA: Diagnosis not present

## 2018-10-09 DIAGNOSIS — Z8572 Personal history of non-Hodgkin lymphomas: Secondary | ICD-10-CM | POA: Diagnosis not present

## 2018-10-09 DIAGNOSIS — Z79899 Other long term (current) drug therapy: Secondary | ICD-10-CM | POA: Diagnosis not present

## 2018-10-09 DIAGNOSIS — Z9221 Personal history of antineoplastic chemotherapy: Secondary | ICD-10-CM | POA: Insufficient documentation

## 2018-10-09 DIAGNOSIS — Z923 Personal history of irradiation: Secondary | ICD-10-CM | POA: Diagnosis not present

## 2018-10-09 DIAGNOSIS — Z9484 Stem cells transplant status: Secondary | ICD-10-CM | POA: Diagnosis not present

## 2018-10-09 DIAGNOSIS — C8195 Hodgkin lymphoma, unspecified, lymph nodes of inguinal region and lower limb: Secondary | ICD-10-CM

## 2018-10-09 DIAGNOSIS — C8590 Non-Hodgkin lymphoma, unspecified, unspecified site: Secondary | ICD-10-CM | POA: Diagnosis not present

## 2018-10-09 LAB — BASIC METABOLIC PANEL
ANION GAP: 9 (ref 5–15)
BUN: 26 mg/dL — AB (ref 6–20)
CO2: 25 mmol/L (ref 22–32)
Calcium: 9.4 mg/dL (ref 8.9–10.3)
Chloride: 106 mmol/L (ref 98–111)
Creatinine, Ser: 1.02 mg/dL (ref 0.61–1.24)
GFR calc Af Amer: >60 mL/min (ref >60)
GFR calc non Af Amer: >60 mL/min (ref >60)
GLUCOSE: 105 mg/dL — AB (ref 70–99)
Potassium: 4.5 mmol/L (ref 3.5–5.1)
Sodium: 140 mmol/L (ref 135–145)

## 2018-10-09 LAB — CBC WITH DIFFERENTIAL/PLATELET
Basophils Absolute: 0.1 10*3/uL (ref 0.0–0.1)
Basophils Relative: 1 %
Eosinophils Absolute: 0.4 10*3/uL (ref 0.0–0.5)
Eosinophils Relative: 5 %
HEMATOCRIT: 45.7 % (ref 39.0–52.0)
HEMOGLOBIN: 14.7 g/dL (ref 13.0–17.0)
LYMPHS ABS: 0.6 10*3/uL — AB (ref 0.7–4.0)
Lymphocytes Relative: 8 %
MCH: 32.1 pg (ref 26.0–34.0)
MCHC: 32.2 g/dL (ref 30.0–36.0)
MCV: 99.8 fL (ref 80.0–100.0)
MONO ABS: 0.9 10*3/uL (ref 0.1–1.0)
MONOS PCT: 12 %
NEUTROS ABS: 5.4 10*3/uL (ref 1.7–7.7)
NEUTROS PCT: 74 %
Platelets: 250 10*3/uL (ref 150–400)
RBC: 4.58 MIL/uL (ref 4.22–5.81)
RDW: 12.6 % (ref 11.5–15.5)
WBC: 7.4 10*3/uL (ref 4.0–10.5)
nRBC: 0 % (ref 0.0–0.2)

## 2018-10-09 LAB — PROTIME-INR
INR: 1.01
Prothrombin Time: 13.2 seconds (ref 11.4–15.2)

## 2018-10-09 MED ORDER — LIDOCAINE HCL (PF) 1 % IJ SOLN
INTRAMUSCULAR | Status: AC | PRN
  Administered 2018-10-09: 10 mL

## 2018-10-09 MED ORDER — MIDAZOLAM HCL 2 MG/2ML IJ SOLN
INTRAMUSCULAR | Status: AC | PRN
  Administered 2018-10-09: 1 mg via INTRAVENOUS

## 2018-10-09 MED ORDER — MIDAZOLAM HCL 2 MG/2ML IJ SOLN
INTRAMUSCULAR | Status: AC
  Filled 2018-10-09: qty 4

## 2018-10-09 MED ORDER — FENTANYL CITRATE (PF) 100 MCG/2ML IJ SOLN
INTRAMUSCULAR | Status: AC | PRN
  Administered 2018-10-09: 50 ug via INTRAVENOUS

## 2018-10-09 MED ORDER — SODIUM CHLORIDE 0.9 % IV SOLN
INTRAVENOUS | Status: DC
  Administered 2018-10-09: 07:00:00 via INTRAVENOUS

## 2018-10-09 MED ORDER — FENTANYL CITRATE (PF) 100 MCG/2ML IJ SOLN
INTRAMUSCULAR | Status: AC
  Filled 2018-10-09: qty 2

## 2018-10-09 NOTE — Progress Notes (Signed)
Lennette Bihari, PA at bedside assessing bandaid that has new bloody drainage, scant amount.  Per Lennette Bihari, bandaid removed and no active bleeding was noted.  Per Lennette Bihari, placed 2x2 guaze to left lower back where puncture site is with cloth tape (medipore). Pt still okay to be discharged at 1030 per kevin.

## 2018-10-09 NOTE — H&P (Signed)
Referring Physician(s): Ennever,Peter R  Supervising Physician: Sandi Mariscal  Patient Status:  [facility]  Chief Complaint:  "I'm having a bone marrow biopsy"  Subjective: Patient familiar to IR service from prior bone marrow biopsy in 2013, Port-A-Cath placement in 2015 with removal in 2017, and left T1 transverse process biopsy in 2018.  He recently underwent right lateral chest wall mass excision on 09/19/2018 which revealed recurrent Hodgkin's lymphoma.  He presents again today for CT-guided bone marrow biopsy for further evaluation.  He denies fever, headache, chest pain, dyspnea, abdominal/back pain, nausea, vomiting or bleeding.  He has had a recent cold.  Past Medical History:  Diagnosis Date  . Anxiety   . Dysrhythmia    past hx pvc  . Goals of care, counseling/discussion 09/25/2018  . H/O autologous stem cell transplant ([place])    may 2014  at [facility]  . History of Bell's palsy    2009  RIGHT SIDE--  HAS 80% FUNCTION / PT STATES A LITTLE ASYMETRICAL AND EFFECTS MOUTH  . History of radiation therapy 10/18/17-11/05/17   sprine T1 26 Gy in 13 fractions, spine boost 10 Gy in 5 fractions  . Hodgkin's disease, nodular sclerosis, of inguinal region/lower limb ([place]) ONOLOGIST--  DR ENNEVER AND A [facility]     [place] CHEMO 2013/  AUTOLOGUS STEM CELL TRANSPLANT MAY 2014 AT [facility]  . Mass of right chest wall 09/18/2018  . Mass of right inguinal region   . PVC (premature ventricular contraction)    "benign"  . TIA (transient ischemic attack) 02/2015   "probable TIA"  . Wears contact lenses    Past Surgical History:  Procedure Laterality Date  . AXILLARY LYMPH NODE BIOPSY Left 02/02/2013   Procedure: NEEDLE LOCALIZED AXILLARY LYMPH NODE BIOPSY;  Surgeon: Haywood Lasso, MD;  Location: [place];  Service: General;  Laterality: Left;  . BONE MARROW BIOPSY  08/2012  . CYST REMOVAL NECK Left 1980  . LEFT INGUINAL LYMPH NODE BX  09-09-2012  . LYMPH NODE BIOPSY N/A 07/28/2015     Procedure: LYMPH NODE BIOPSY;  Surgeon: Melrose Nakayama, MD;  Location: [place];  Service: Thoracic;  Laterality: N/A;  . MASS EXCISION Right 09/19/2018   Procedure: EXCISION OF RIGHT CHEST WALL MASS ERAS PATHWAY;  Surgeon: Fanny Skates, MD;  Location: [place];  Service: General;  Laterality: Right;  . NODE DISSECTION Right 07/28/2015   Procedure: NODE DISSECTION;  Surgeon: Melrose Nakayama, MD;  Location: [place];  Service: Thoracic;  Laterality: Right;  . PLEURA BIOPSY Left 05/31/2014  . REMOVAL RIGHT INGUINAL LYMPH NODES  08-16-2011  . SCROTAL EXPLORATION Right 01/04/2014   Procedure: SCROTUM EXPLORATION   INGUINAL , EXCISION OF CYSTIC MASS OF RIGHT SPERMATIC CORD, WITH FROZEN SECTION;  Surgeon: Franchot Gallo, MD;  Location: [facility];  Service: Urology;  Laterality: Right;  . TRANSTHORACIC ECHOCARDIOGRAM  03-18-2013   MILD LVH/  EF 55-60%  . VIDEO ASSISTED THORACOSCOPY Left 05/31/2014   Procedure: LEFT VIDEO ASSISTED THORACOSCOPY, PLEURAL BIOPSY;  Surgeon: Melrose Nakayama, MD;  Location: [place];  Service: Thoracic;  Laterality: Left;  Marland Kitchen VIDEO ASSISTED THORACOSCOPY Right 07/28/2015   Procedure: RIGHT VIDEO ASSISTED THORACOSCOPY;  Surgeon: Melrose Nakayama, MD;  Location: [place];  Service: Thoracic;  Laterality: Right;  Marland Kitchen VIDEO ASSISTED THORACOSCOPY (VATS)/ LYMPH NODE SAMPLING Right 07/28/2015  . VIDEO ASSISTED THORACOSCOPY (VATS)/WEDGE RESECTION  05/31/2014  . VIDEO BRONCHOSCOPY WITH ENDOBRONCHIAL ULTRASOUND  07/28/2015  .  VIDEO BRONCHOSCOPY WITH ENDOBRONCHIAL ULTRASOUND N/A 07/28/2015   Procedure: VIDEO BRONCHOSCOPY WITH ENDOBRONCHIAL ULTRASOUND;  Surgeon: Melrose Nakayama, MD;  Location: [place];  Service: Thoracic;  Laterality: N/A;      Allergies: Patient has no known allergies.  Medications: Prior to Admission medications   Medication Sig Start Date End Date Taking? Authorizing Provider  acetaminophen (TYLENOL) 325 MG tablet Take 650 mg  by mouth daily as needed for mild pain.    Yes [provider]  carbidopa-levodopa (SINEMET IR) 25-100 MG tablet Take 1 tablet by mouth 3 (three) times daily. 1 extra QD PRN 09/01/18  Yes Tat, Eustace Quail, DO  Cholecalciferol (VITAMIN D-3) 1000 UNITS CAPS Take 1,000 Units by mouth 4 (four) times a week.    Yes [provider]  loperamide (IMODIUM A-D) 2 MG tablet Take 4 mg by mouth as needed for diarrhea or loose stools.   Yes [provider]  pramipexole (MIRAPEX) 1 MG tablet Take 1 tablet (1 mg total) by mouth 3 (three) times daily. 05/26/18  Yes Tat, Eustace Quail, DO  HYDROcodone-acetaminophen (NORCO) 5-325 MG tablet Take 1-2 tablets by mouth every 6 (six) hours as needed for moderate pain or severe pain. 09/19/18   Fanny Skates, MD  sildenafil (REVATIO) 20 MG tablet Take 1-5 pills daily as needed prior to intercourse 08/28/18   Copland, [name], MD     Vital Signs: BP 130/83 (BP Location: Left Arm)   Pulse 87   Temp 98.3 F (36.8 C) (Oral)   Resp 18   Ht _0  (1.778 m)   Wt 180 lb (81.6 kg)   SpO2 97%   BMI 25.83 kg/m   Physical Exam awake, alert.  Chest clear to auscultation bilaterally.  Heart with regular rate and rhythm.  Abdomen soft, positive bowel sounds, nontender.  No lower extremity edema.  Imaging: No results found.  Labs:  CBC: Recent Labs    01/16/18 1503 08/28/18 1203 10/02/18 1104 10/09/18 0715  WBC 4.3 5.6 5.4 7.4  HGB 14.5 15.0 14.5 14.7  HCT 43.3 45.2 45.2 45.7  PLT 248.0 255.0 266 250    COAGS: Recent Labs    10/09/18 0715  INR 1.01    BMP: Recent Labs    01/16/18 1503 10/02/18 1104 10/09/18 0715  NA 140 143 140  K 4.9 5.3* 4.5  CL 103 107 106  CO2 _1 GLUCOSE 91 96 105*  BUN 24* 23* 26*  CALCIUM 9.9 9.6 9.4  CREATININE 1.15 1.10 1.02  GFRNONAA  --   --  >60  GFRAA  --   --  >60    LIVER FUNCTION TESTS: Recent Labs    01/16/18 1503 10/02/18 1104  BILITOT 0.7 0.8  AST 16 29  ALT 5 18   ALKPHOS 79 96*  PROT 6.8 7.0  ALBUMIN 4.5 4.0    Assessment and Plan: Patient with history of recurrent Hodgkin's lymphoma; presents today for CT-guided bone marrow biopsy for further evaluation.Risks and benefits discussed with the patient including, but not limited to bleeding, infection, damage to adjacent structures or low yield requiring additional tests.  All of the patient's questions were answered, patient is agreeable to proceed. Consent signed and in chart.     Electronically Signed: D. Rowe Robert, PA-C 10/09/2018, 8:32 AM   I spent a total of 20 minutes at the the patient's bedside AND on the patient's hospital floor or unit, greater than 50% of which was counseling/coordinating care for CT-guided  bone marrow biopsy

## 2018-10-09 NOTE — Procedures (Signed)
Pre-procedure Diagnosis: Hodgkin's Lymphoma Post-procedure Diagnosis: Same  Technically successful CT guided bone marrow aspiration and biopsy of left iliac crest.   Complications: None Immediate  EBL: None  SignedSandi Mariscal Pager: [phone] 10/09/2018, 9:30 AM

## 2018-10-09 NOTE — Discharge Instructions (Signed)
Moderate Conscious Sedation, Adult, Care After These instructions provide you with information about caring for yourself after your procedure. Your health care provider may also give you more specific instructions. Your treatment has been planned according to current medical practices, but problems sometimes occur. Call your health care provider if you have any problems or questions after your procedure. What can I expect after the procedure? After your procedure, it is common:  To feel sleepy for several hours.  To feel clumsy and have poor balance for several hours.  To have poor judgment for several hours.  To vomit if you eat too soon.  Follow these instructions at home: For at least 24 hours after the procedure:   Do not: ? Participate in activities where you could fall or become injured. ? Drive. ? Use heavy machinery. ? Drink alcohol. ? Take sleeping pills or medicines that cause drowsiness. ? Make important decisions or sign legal documents. ? Take care of children on your own.  Rest. Eating and drinking  Follow the diet recommended by your health care provider.  If you vomit: ? Drink water, juice, or soup when you can drink without vomiting. ? Make sure you have little or no nausea before eating solid foods. General instructions  Have a responsible adult stay with you until you are awake and alert.  Take over-the-counter and prescription medicines only as told by your health care provider.  If you smoke, do not smoke without supervision.  Keep all follow-up visits as told by your health care provider. This is important. Contact a health care provider if:  You keep feeling nauseous or you keep vomiting.  You feel light-headed.  You develop a rash.  You have a fever. Get help right away if:  You have trouble breathing. This information is not intended to replace advice given to you by your health care provider. Make sure you discuss any questions you have  with your health care provider. Document Released: 09/02/2013 Document Revised: 04/16/2016 Document Reviewed: 03/03/2016 Elsevier Interactive Patient Education  2018 [place]. Bone Marrow Aspiration and Bone Marrow Biopsy, Adult, Care After This sheet gives you information about how to care for yourself after your procedure. Your health care provider may also give you more specific instructions. If you have problems or questions, contact your health care provider. What can I expect after the procedure? After the procedure, it is common to have:  Mild pain and tenderness.  Swelling.  Bruising.  Follow these instructions at home:  Take over-the-counter or prescription medicines only as told by your health care provider.  Do not take baths, swim, or use a hot tub until your health care provider approves. Ask if you can take a shower or have a sponge bath.  Follow instructions from your health care provider about how to take care of the puncture site. Make sure you: ? Wash your hands with soap and water before you change your bandage (dressing). If soap and water are not available, use hand sanitizer. ? Change your dressing as told by your health care provider.  Check your puncture siteevery day for signs of infection. Check for: ? More redness, swelling, or pain. ? More fluid or blood. ? Warmth. ? Pus or a bad smell.  Return to your normal activities as told by your health care provider. Ask your health care provider what activities are safe for you.  Do not drive for 24 hours if you were given a medicine to help you relax (sedative).  Keep all follow-up visits as told by your health care provider. This is important. °Contact a health care provider if: °· You have more redness, swelling, or pain around the puncture site. °· You have more fluid or blood coming from the puncture site. °· Your puncture site feels warm to the touch. °· You have pus or a bad smell coming from the  puncture site. °· You have a fever. °· Your pain is not controlled with medicine. °This information is not intended to replace advice given to you by your health care provider. Make sure you discuss any questions you have with your health care provider. °Document Released: 06/01/2005 Document Revised: 06/01/2016 Document Reviewed: 04/25/2016 °Elsevier Interactive Patient Education © 2018 Elsevier Inc. ° °

## 2018-10-10 ENCOUNTER — Encounter: Payer: Self-pay | Admitting: Hematology & Oncology

## 2018-10-20 ENCOUNTER — Encounter (HOSPITAL_COMMUNITY): Payer: Self-pay | Admitting: Hematology & Oncology

## 2018-10-22 ENCOUNTER — Other Ambulatory Visit: Payer: Self-pay | Admitting: Pharmacist

## 2018-10-22 DIAGNOSIS — C8195 Hodgkin lymphoma, unspecified, lymph nodes of inguinal region and lower limb: Secondary | ICD-10-CM

## 2018-10-24 ENCOUNTER — Encounter: Payer: Self-pay | Admitting: Hematology & Oncology

## 2018-10-28 ENCOUNTER — Encounter: Payer: Self-pay | Admitting: Hematology & Oncology

## 2018-10-30 ENCOUNTER — Inpatient Hospital Stay: Payer: Commercial Managed Care - PPO | Attending: Hematology & Oncology | Admitting: Hematology & Oncology

## 2018-10-30 ENCOUNTER — Other Ambulatory Visit: Payer: Self-pay

## 2018-10-30 ENCOUNTER — Other Ambulatory Visit: Payer: Self-pay | Admitting: *Deleted

## 2018-10-30 ENCOUNTER — Encounter: Payer: Self-pay | Admitting: Hematology & Oncology

## 2018-10-30 ENCOUNTER — Inpatient Hospital Stay: Payer: Commercial Managed Care - PPO

## 2018-10-30 VITALS — BP 138/87 | HR 74 | Temp 97.9°F | Resp 20

## 2018-10-30 DIAGNOSIS — C8195 Hodgkin lymphoma, unspecified, lymph nodes of inguinal region and lower limb: Secondary | ICD-10-CM

## 2018-10-30 DIAGNOSIS — Z9484 Stem cells transplant status: Secondary | ICD-10-CM | POA: Diagnosis not present

## 2018-10-30 DIAGNOSIS — Z5111 Encounter for antineoplastic chemotherapy: Secondary | ICD-10-CM | POA: Insufficient documentation

## 2018-10-30 DIAGNOSIS — G20 Parkinson's disease: Secondary | ICD-10-CM | POA: Diagnosis not present

## 2018-10-30 DIAGNOSIS — C8199 Hodgkin lymphoma, unspecified, extranodal and solid organ sites: Secondary | ICD-10-CM

## 2018-10-30 DIAGNOSIS — Z923 Personal history of irradiation: Secondary | ICD-10-CM

## 2018-10-30 DIAGNOSIS — Z79899 Other long term (current) drug therapy: Secondary | ICD-10-CM

## 2018-10-30 LAB — CBC WITH DIFFERENTIAL (CANCER CENTER ONLY)
Abs Immature Granulocytes: 0.03 10*3/uL (ref 0.00–0.07)
BASOS ABS: 0.1 10*3/uL (ref 0.0–0.1)
Basophils Relative: 1 %
EOS ABS: 0.1 10*3/uL (ref 0.0–0.5)
Eosinophils Relative: 3 %
HEMATOCRIT: 43.8 % (ref 39.0–52.0)
Hemoglobin: 13.9 g/dL (ref 13.0–17.0)
IMMATURE GRANULOCYTES: 1 %
LYMPHS ABS: 0.6 10*3/uL — AB (ref 0.7–4.0)
LYMPHS PCT: 12 %
MCH: 31.7 pg (ref 26.0–34.0)
MCHC: 31.7 g/dL (ref 30.0–36.0)
MCV: 100 fL (ref 80.0–100.0)
Monocytes Absolute: 0.6 10*3/uL (ref 0.1–1.0)
Monocytes Relative: 11 %
NEUTROS ABS: 3.7 10*3/uL (ref 1.7–7.7)
NEUTROS PCT: 72 %
Platelet Count: 222 10*3/uL (ref 150–400)
RBC: 4.38 MIL/uL (ref 4.22–5.81)
RDW: 12.5 % (ref 11.5–15.5)
WBC Count: 5.1 10*3/uL (ref 4.0–10.5)
nRBC: 0 % (ref 0.0–0.2)

## 2018-10-30 LAB — CMP (CANCER CENTER ONLY)
ALBUMIN: 4.3 g/dL (ref 3.5–5.0)
ALK PHOS: 81 U/L (ref 38–126)
ALT: 10 U/L (ref 0–44)
AST: 19 U/L (ref 15–41)
Anion gap: 5 (ref 5–15)
BUN: 28 mg/dL — AB (ref 6–20)
CALCIUM: 9.3 mg/dL (ref 8.9–10.3)
CO2: 29 mmol/L (ref 22–32)
CREATININE: 1.18 mg/dL (ref 0.61–1.24)
Chloride: 101 mmol/L (ref 98–111)
GFR, Est AFR Am: >60 mL/min (ref >60)
GFR, Estimated: >60 mL/min (ref >60)
GLUCOSE: 84 mg/dL (ref 70–99)
Potassium: 4.3 mmol/L (ref 3.5–5.1)
Sodium: 135 mmol/L (ref 135–145)
TOTAL PROTEIN: 6.5 g/dL (ref 6.5–8.1)
Total Bilirubin: 0.6 mg/dL (ref 0.3–1.2)

## 2018-10-30 LAB — SEDIMENTATION RATE: Sed Rate: 2 mm/hr (ref 0–16)

## 2018-10-30 LAB — LACTATE DEHYDROGENASE: LDH: 222 U/L — ABNORMAL HIGH (ref 98–192)

## 2018-10-30 MED ORDER — SODIUM CHLORIDE 0.9 % IV SOLN
480.0000 mg | Freq: Once | INTRAVENOUS | Status: AC
  Administered 2018-10-30: 480 mg via INTRAVENOUS
  Filled 2018-10-30: qty 48

## 2018-10-30 MED ORDER — SODIUM CHLORIDE 0.9 % IV SOLN
Freq: Once | INTRAVENOUS | Status: AC
  Administered 2018-10-30: 13:00:00 via INTRAVENOUS
  Filled 2018-10-30: qty 250

## 2018-10-30 NOTE — Progress Notes (Signed)
Hematology and Oncology Follow Up Visit  Brian [name] [id number] 11/28/1960 57 y.o. 10/30/2018   Principle Diagnosis:  Recurrent Hodgkin's Disease -  S/p CAR-T therapy  TIA-resolved  Current Therapy:  Nivolumab q month -- s/p cycle #1        Interim History:  [name] is in for follow-up.  He is doing pretty well.  He has had no problems with the nivolumab.  He is working.  He is busy at work.  He has had no problems with rashes.  He has had no issues with cough or shortness of breath.  He has not noted any swollen lymph nodes.  There is been no diarrhea.  He has had no fever.  He has had no leg swelling.  He still is dealing with the neurological issues.  He has early onset Parkinson's.  He is exercising.  He would like to try to exercise more if possible.  He and his family did have a nice Thanksgiving.  It sounds like he will be busy over Christmas.  He is being followed peripherally by [facility].  Hopefully, he might be eligible for a clinical trial in the future.  I will repeat a PET scan on him after his third cycle of treatment.  Currently, his performance status is ECOG 0.     Medications:  Current Outpatient Medications:  .  acetaminophen (TYLENOL) 325 MG tablet, Take 650 mg by mouth daily as needed for mild pain. , Disp: , Rfl:  .  carbidopa-levodopa (SINEMET IR) 25-100 MG tablet, Take 1 tablet by mouth 3 (three) times daily. 1 extra QD PRN, Disp: 360 tablet, Rfl: 1 .  Cholecalciferol (VITAMIN D-3) 1000 UNITS CAPS, Take 1,000 Units by mouth 4 (four) times a week. , Disp: , Rfl:  .  pramipexole (MIRAPEX) 1 MG tablet, Take 1 tablet (1 mg total) by mouth 3 (three) times daily., Disp: 270 tablet, Rfl: 1 .  HYDROcodone-acetaminophen (NORCO) 5-325 MG tablet, Take 1-2 tablets by mouth every 6 (six) hours as needed for moderate pain or severe pain. (Patient not taking: Reported on 10/30/2018), Disp: 20 tablet, Rfl: 0 .  sildenafil (REVATIO) 20 MG tablet, Take 1-5  pills daily as needed prior to intercourse (Patient not taking: Reported on 10/30/2018), Disp: 30 tablet, Rfl: 1  Allergies: No Known Allergies  Past Medical History, Surgical history, Social history, and Family History were reviewed and updated.  Review of Systems: Review of Systems  Neurological: Positive for tremors.  All other systems reviewed and are negative.    Physical Exam:  oral temperature is 97.9 F (36.6 C). His blood pressure is 138/87 and his pulse is 74. His respiration is 20 and oxygen saturation is 99%.   Physical Exam  Constitutional: He is oriented to person, place, and time.  HENT:  Head: Normocephalic and atraumatic.  Mouth/Throat: Oropharynx is clear and moist.  Eyes: Pupils are equal, round, and reactive to light. EOM are normal.  Neck: Normal range of motion.  Cardiovascular: Normal rate, regular rhythm and normal heart sounds.  Pulmonary/Chest: Effort normal and breath sounds normal.  Abdominal: Soft. Bowel sounds are normal.  Musculoskeletal: Normal range of motion. He exhibits no edema, tenderness or deformity.  Lymphadenopathy:    He has no cervical adenopathy.  Neurological: He is alert and oriented to person, place, and time.  Skin: Skin is warm and dry. No rash noted. No erythema.  Psychiatric: He has a normal mood and affect. His behavior is normal.  Judgment and thought content normal.  Vitals reviewed.   Lab Results  Component Value Date   WBC 5.1 10/30/2018   HGB 13.9 10/30/2018   HCT 43.8 10/30/2018   MCV 100.0 10/30/2018   PLT 222 10/30/2018     Chemistry      Component Value Date/Time   NA 135 10/30/2018 1117   NA 142 04/02/2017   NA 140 07/19/2016 1305   K 4.3 10/30/2018 1117   K 4.4 07/19/2016 1305   CL 101 10/30/2018 1117   CL 105 02/08/2016 1117   CL 107 03/25/2013 0828   CO2 29 10/30/2018 1117   CO2 26 07/19/2016 1305   BUN 28 (H) 10/30/2018 1117   BUN 21 04/02/2017   BUN 22.8 07/19/2016 1305   CREATININE 1.18  10/30/2018 1117   CREATININE 1.1 07/19/2016 1305   GLU 105 04/02/2017      Component Value Date/Time   CALCIUM 9.3 10/30/2018 1117   CALCIUM 9.8 07/19/2016 1305   ALKPHOS 81 10/30/2018 1117   ALKPHOS 76 07/19/2016 1305   AST 19 10/30/2018 1117   AST 18 07/19/2016 1305   ALT 10 10/30/2018 1117   ALT 14 07/19/2016 1305   BILITOT 0.6 10/30/2018 1117   BILITOT 0.70 07/19/2016 1305         Impression and Plan: Brian [name] is a 57 year old gentleman.  He has history of recurrent Hodgkin's disease. 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 He then had a recurrence after this.  He did undergo CAR-T therapy at [facility].  He did well with this.  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  This was proven by biopsy.  He had radiation therapy for this.  Again, he apparently has disease that is quite resilient.  I really just feel bad that this Hodgkin's keeps coming back.  He has not yet had an allogeneic transplant.  This would certainly be a option for him although it would be a very tough one for him to get through.  We will proceed with his second cycle of nivolumab.  We will have him come back in 1 month for his third cycle.  I will then do a PET scan to see how everything looks.  I again, encouraged him to exercise.  I really think exercise would help.    [name], MD 12/5/201912:28 PM

## 2018-10-30 NOTE — Patient Instructions (Signed)
[facility] Discharge Instructions for Patients Receiving Chemotherapy  Today you received the following chemotherapy agents:  Nivolumab  To help prevent nausea and vomiting after your treatment, we encourage you to take your nausea medication as prescribed.   If you develop nausea and vomiting that is not controlled by your nausea medication, call the clinic.   BELOW ARE SYMPTOMS THAT SHOULD BE REPORTED IMMEDIATELY:  *FEVER GREATER THAN 100.5 F  *CHILLS WITH OR WITHOUT FEVER  NAUSEA AND VOMITING THAT IS NOT CONTROLLED WITH YOUR NAUSEA MEDICATION  *UNUSUAL SHORTNESS OF BREATH  *UNUSUAL BRUISING OR BLEEDING  TENDERNESS IN MOUTH AND THROAT WITH OR WITHOUT PRESENCE OF ULCERS  *URINARY PROBLEMS  *BOWEL PROBLEMS  UNUSUAL RASH Items with * indicate a potential emergency and should be followed up as soon as possible.  Feel free to call the clinic should you have any questions or concerns. The clinic phone number is [phone].  Please show the CHEMO ALERT CARD at check-in to the Emergency Department and triage nurse.   

## 2018-10-31 LAB — TSH: TSH: 2.54 u[IU]/mL (ref 0.320–4.118)

## 2018-11-14 ENCOUNTER — Encounter: Payer: Self-pay | Admitting: Family Medicine

## 2018-11-14 ENCOUNTER — Telehealth: Payer: Self-pay

## 2018-11-14 DIAGNOSIS — H9193 Unspecified hearing loss, bilateral: Secondary | ICD-10-CM

## 2018-11-14 NOTE — Telephone Encounter (Signed)
Referral pending.

## 2018-11-14 NOTE — Telephone Encounter (Signed)
Copied from [place] [id number]. Topic: Referral - Request for Referral >> Nov 14, 2018  1:40 PM Lennox Solders wrote: .Has patient seen PCP for this complaint?yes. Pt  seen dr copland in Aug 28, 2018. Pt needs a referral to ENT for hearing loss in left ear and pt is having fullness in right ear. Pt has [employer]

## 2018-11-24 ENCOUNTER — Encounter: Payer: Self-pay | Admitting: Hematology & Oncology

## 2018-11-27 ENCOUNTER — Inpatient Hospital Stay: Payer: Commercial Managed Care - PPO

## 2018-11-27 ENCOUNTER — Inpatient Hospital Stay: Payer: Commercial Managed Care - PPO | Attending: Hematology & Oncology | Admitting: Hematology & Oncology

## 2018-11-27 ENCOUNTER — Encounter: Payer: Self-pay | Admitting: Hematology & Oncology

## 2018-11-27 ENCOUNTER — Other Ambulatory Visit: Payer: Self-pay

## 2018-11-27 VITALS — BP 124/75 | HR 72 | Temp 97.8°F | Resp 18 | Wt 188.0 lb

## 2018-11-27 DIAGNOSIS — Z9484 Stem cells transplant status: Secondary | ICD-10-CM | POA: Insufficient documentation

## 2018-11-27 DIAGNOSIS — H903 Sensorineural hearing loss, bilateral: Secondary | ICD-10-CM | POA: Diagnosis not present

## 2018-11-27 DIAGNOSIS — Z5112 Encounter for antineoplastic immunotherapy: Secondary | ICD-10-CM | POA: Insufficient documentation

## 2018-11-27 DIAGNOSIS — Z923 Personal history of irradiation: Secondary | ICD-10-CM | POA: Diagnosis not present

## 2018-11-27 DIAGNOSIS — C8195 Hodgkin lymphoma, unspecified, lymph nodes of inguinal region and lower limb: Secondary | ICD-10-CM

## 2018-11-27 DIAGNOSIS — H833X2 Noise effects on left inner ear: Secondary | ICD-10-CM | POA: Diagnosis not present

## 2018-11-27 DIAGNOSIS — C8199 Hodgkin lymphoma, unspecified, extranodal and solid organ sites: Secondary | ICD-10-CM | POA: Diagnosis present

## 2018-11-27 DIAGNOSIS — G20 Parkinson's disease: Secondary | ICD-10-CM

## 2018-11-27 DIAGNOSIS — Z79899 Other long term (current) drug therapy: Secondary | ICD-10-CM | POA: Diagnosis not present

## 2018-11-27 DIAGNOSIS — H9312 Tinnitus, left ear: Secondary | ICD-10-CM | POA: Diagnosis not present

## 2018-11-27 LAB — CBC WITH DIFFERENTIAL (CANCER CENTER ONLY)
Abs Immature Granulocytes: 0.02 10*3/uL (ref 0.00–0.07)
Basophils Absolute: 0.1 10*3/uL (ref 0.0–0.1)
Basophils Relative: 2 %
Eosinophils Absolute: 0.3 10*3/uL (ref 0.0–0.5)
Eosinophils Relative: 7 %
HCT: 46.1 % (ref 39.0–52.0)
HEMOGLOBIN: 14.6 g/dL (ref 13.0–17.0)
Immature Granulocytes: 1 %
Lymphocytes Relative: 15 %
Lymphs Abs: 0.6 10*3/uL — ABNORMAL LOW (ref 0.7–4.0)
MCH: 31.1 pg (ref 26.0–34.0)
MCHC: 31.7 g/dL (ref 30.0–36.0)
MCV: 98.1 fL (ref 80.0–100.0)
Monocytes Absolute: 0.5 10*3/uL (ref 0.1–1.0)
Monocytes Relative: 14 %
Neutro Abs: 2.5 10*3/uL (ref 1.7–7.7)
Neutrophils Relative %: 61 %
Platelet Count: 207 10*3/uL (ref 150–400)
RBC: 4.7 MIL/uL (ref 4.22–5.81)
RDW: 12.3 % (ref 11.5–15.5)
WBC Count: 3.9 10*3/uL — ABNORMAL LOW (ref 4.0–10.5)
nRBC: 0 % (ref 0.0–0.2)

## 2018-11-27 LAB — CMP (CANCER CENTER ONLY)
ALK PHOS: 76 U/L (ref 38–126)
ALT: 8 U/L (ref 0–44)
AST: 18 U/L (ref 15–41)
Albumin: 4.4 g/dL (ref 3.5–5.0)
Anion gap: 8 (ref 5–15)
BUN: 21 mg/dL — ABNORMAL HIGH (ref 6–20)
CO2: 27 mmol/L (ref 22–32)
Calcium: 9.2 mg/dL (ref 8.9–10.3)
Chloride: 102 mmol/L (ref 98–111)
Creatinine: 1.15 mg/dL (ref 0.61–1.24)
GFR, Est AFR Am: >60 mL/min (ref >60)
Glucose, Bld: 95 mg/dL (ref 70–99)
Potassium: 4.3 mmol/L (ref 3.5–5.1)
Sodium: 137 mmol/L (ref 135–145)
Total Bilirubin: 0.6 mg/dL (ref 0.3–1.2)
Total Protein: 7.1 g/dL (ref 6.5–8.1)

## 2018-11-27 LAB — LACTATE DEHYDROGENASE: LDH: 246 U/L — AB (ref 98–192)

## 2018-11-27 MED ORDER — SODIUM CHLORIDE 0.9 % IV SOLN
Freq: Once | INTRAVENOUS | Status: AC
  Administered 2018-11-27: 11:00:00 via INTRAVENOUS
  Filled 2018-11-27: qty 250

## 2018-11-27 MED ORDER — SODIUM CHLORIDE 0.9 % IV SOLN
480.0000 mg | Freq: Once | INTRAVENOUS | Status: AC
  Administered 2018-11-27: 480 mg via INTRAVENOUS
  Filled 2018-11-27: qty 48

## 2018-11-27 NOTE — Progress Notes (Signed)
Hematology and Oncology Follow Up Visit  Brian [name] [id number] 17-Sep-1961 58 y.o. 11/27/2018   Principle Diagnosis:  Recurrent Hodgkin's Disease -  S/p CAR-T therapy  TIA-resolved  Current Therapy:  Nivolumab q month -- s/p cycle #2       Interim History:  [name] is in for follow-up.  He actually is doing fairly well.  The big news with him is that he will be going to [place] for 2 weeks.  Apparently, his brother is paying for him to go out to [place].  I am sure that he will have a fantastic time.  He did enjoy the holidays.  He was with his family.  He is still working.  So far, he has been doing fairly well.  He still is dealing with the Parkinson's-like issues.  However, he is exercising more.  He is trying to lose a little bit of weight.  He has had no fever.  He has had no change in bowel or bladder habits.  He has had no rashes.  There is been no leg swelling.  He has had no headache.  Overall, his performance status is ECOG 1.    Medications:  Current Outpatient Medications:  .  acetaminophen (TYLENOL) 325 MG tablet, Take 650 mg by mouth daily as needed for mild pain. , Disp: , Rfl:  .  carbidopa-levodopa (SINEMET IR) 25-100 MG tablet, Take 1 tablet by mouth 3 (three) times daily. 1 extra QD PRN, Disp: 360 tablet, Rfl: 1 .  Cholecalciferol (VITAMIN D-3) 1000 UNITS CAPS, Take 1,000 Units by mouth 4 (four) times a week. , Disp: , Rfl:  .  pramipexole (MIRAPEX) 1 MG tablet, Take 1 tablet (1 mg total) by mouth 3 (three) times daily., Disp: 270 tablet, Rfl: 1 .  sildenafil (REVATIO) 20 MG tablet, Take 1-5 pills daily as needed prior to intercourse (Patient not taking: Reported on 10/30/2018), Disp: 30 tablet, Rfl: 1  Allergies: No Known Allergies  Past Medical History, Surgical history, Social history, and Family History were reviewed and updated.  Review of Systems: Review of Systems  Neurological: Positive for tremors.  All other systems reviewed and are  negative.    Physical Exam:  weight is 188 lb (85.3 kg). His oral temperature is 97.8 F (36.6 C). His blood pressure is 124/75 and his pulse is 72. His respiration is 18 and oxygen saturation is 100%.   Physical Exam Vitals signs reviewed.  HENT:     Head: Normocephalic and atraumatic.  Eyes:     Pupils: Pupils are equal, round, and reactive to light.  Neck:     Musculoskeletal: Normal range of motion.  Cardiovascular:     Rate and Rhythm: Normal rate and regular rhythm.     Heart sounds: Normal heart sounds.  Pulmonary:     Effort: Pulmonary effort is normal.     Breath sounds: Normal breath sounds.  Abdominal:     General: Bowel sounds are normal.     Palpations: Abdomen is soft.  Musculoskeletal: Normal range of motion.        General: No tenderness or deformity.  Lymphadenopathy:     Cervical: No cervical adenopathy.  Skin:    General: Skin is warm and dry.     Findings: No erythema or rash.  Neurological:     Mental Status: He is alert and oriented to person, place, and time.  Psychiatric:        Behavior: Behavior normal.  Thought Content: Thought content normal.        Judgment: Judgment normal.     Lab Results  Component Value Date   WBC 3.9 (L) 11/27/2018   HGB 14.6 11/27/2018   HCT 46.1 11/27/2018   MCV 98.1 11/27/2018   PLT 207 11/27/2018     Chemistry      Component Value Date/Time   NA 137 11/27/2018 0917   NA 142 04/02/2017   NA 140 07/19/2016 1305   K 4.3 11/27/2018 0917   K 4.4 07/19/2016 1305   CL 102 11/27/2018 0917   CL 105 02/08/2016 1117   CL 107 03/25/2013 0828   CO2 27 11/27/2018 0917   CO2 26 07/19/2016 1305   BUN 21 (H) 11/27/2018 0917   BUN 21 04/02/2017   BUN 22.8 07/19/2016 1305   CREATININE 1.15 11/27/2018 0917   CREATININE 1.1 07/19/2016 1305   GLU 105 04/02/2017      Component Value Date/Time   CALCIUM 9.2 11/27/2018 0917   CALCIUM 9.8 07/19/2016 1305   ALKPHOS 76 11/27/2018 0917   ALKPHOS 76 07/19/2016  1305   AST 18 11/27/2018 0917   AST 18 07/19/2016 1305   ALT 8 11/27/2018 0917   ALT 14 07/19/2016 1305   BILITOT 0.6 11/27/2018 0917   BILITOT 0.70 07/19/2016 1305         Impression and Plan: Brian [name] is a 58 year old gentleman.  He has history of recurrent Hodgkin's disease. 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 He then had a recurrence after this.  He did undergo CAR[facility]T therapy at [facility].  He did well with this.  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  This was proven by biopsy.  He had radiation therapy for this.  Again, he apparently has disease that is quite resilient.  I really just feel bad that this Hodgkin's keeps coming back.  We will go ahead with his third cycle of nivolumab.  I will do 4 cycles and then do a PET scan to see how everything looks.  Again, I told him to take 162 mg of aspirin on his trip to [place].  I do not want him getting any type of blood clot on the flight.  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  He will drink a lot of water.  I will see him back in 4 weeks.   [name], MD 1/2/202010:59 AM

## 2018-11-27 NOTE — Patient Instructions (Signed)
[facility] Discharge Instructions for Patients Receiving Chemotherapy  Today you received the following chemotherapy agents:  Nivolumab  To help prevent nausea and vomiting after your treatment, we encourage you to take your nausea medication as ordered per MD.    If you develop nausea and vomiting that is not controlled by your nausea medication, call the clinic.   BELOW ARE SYMPTOMS THAT SHOULD BE REPORTED IMMEDIATELY:  *FEVER GREATER THAN 100.5 F  *CHILLS WITH OR WITHOUT FEVER  NAUSEA AND VOMITING THAT IS NOT CONTROLLED WITH YOUR NAUSEA MEDICATION  *UNUSUAL SHORTNESS OF BREATH  *UNUSUAL BRUISING OR BLEEDING  TENDERNESS IN MOUTH AND THROAT WITH OR WITHOUT PRESENCE OF ULCERS  *URINARY PROBLEMS  *BOWEL PROBLEMS  UNUSUAL RASH Items with * indicate a potential emergency and should be followed up as soon as possible.  Feel free to call the clinic should you have any questions or concerns. The clinic phone number is [phone].  Please show the CHEMO ALERT CARD at check-in to the Emergency Department and triage nurse.   

## 2018-12-09 ENCOUNTER — Other Ambulatory Visit: Payer: Self-pay | Admitting: Neurology

## 2018-12-10 ENCOUNTER — Encounter: Payer: Self-pay | Admitting: Family Medicine

## 2018-12-18 ENCOUNTER — Other Ambulatory Visit: Payer: Self-pay | Admitting: Nurse Practitioner

## 2018-12-22 ENCOUNTER — Other Ambulatory Visit: Payer: Self-pay | Admitting: Hematology & Oncology

## 2018-12-22 DIAGNOSIS — C8195 Hodgkin lymphoma, unspecified, lymph nodes of inguinal region and lower limb: Secondary | ICD-10-CM

## 2018-12-25 ENCOUNTER — Inpatient Hospital Stay: Payer: Commercial Managed Care - PPO

## 2018-12-25 ENCOUNTER — Encounter: Payer: Self-pay | Admitting: Hematology & Oncology

## 2018-12-25 ENCOUNTER — Inpatient Hospital Stay (HOSPITAL_BASED_OUTPATIENT_CLINIC_OR_DEPARTMENT_OTHER): Payer: Commercial Managed Care - PPO | Admitting: Hematology & Oncology

## 2018-12-25 ENCOUNTER — Other Ambulatory Visit: Payer: Self-pay

## 2018-12-25 VITALS — BP 135/85 | HR 72 | Temp 98.2°F | Resp 18 | Wt 186.0 lb

## 2018-12-25 DIAGNOSIS — Z923 Personal history of irradiation: Secondary | ICD-10-CM

## 2018-12-25 DIAGNOSIS — C8195 Hodgkin lymphoma, unspecified, lymph nodes of inguinal region and lower limb: Secondary | ICD-10-CM

## 2018-12-25 DIAGNOSIS — Z79899 Other long term (current) drug therapy: Secondary | ICD-10-CM

## 2018-12-25 DIAGNOSIS — Z5112 Encounter for antineoplastic immunotherapy: Secondary | ICD-10-CM | POA: Diagnosis not present

## 2018-12-25 DIAGNOSIS — C8199 Hodgkin lymphoma, unspecified, extranodal and solid organ sites: Secondary | ICD-10-CM | POA: Diagnosis not present

## 2018-12-25 DIAGNOSIS — G20 Parkinson's disease: Secondary | ICD-10-CM | POA: Diagnosis not present

## 2018-12-25 LAB — CBC WITH DIFFERENTIAL (CANCER CENTER ONLY)
ABS IMMATURE GRANULOCYTES: 0.02 10*3/uL (ref 0.00–0.07)
Basophils Absolute: 0.1 10*3/uL (ref 0.0–0.1)
Basophils Relative: 1 %
Eosinophils Absolute: 0.2 10*3/uL (ref 0.0–0.5)
Eosinophils Relative: 3 %
HCT: 43.5 % (ref 39.0–52.0)
Hemoglobin: 14.3 g/dL (ref 13.0–17.0)
IMMATURE GRANULOCYTES: 0 %
LYMPHS ABS: 0.7 10*3/uL (ref 0.7–4.0)
Lymphocytes Relative: 13 %
MCH: 31.9 pg (ref 26.0–34.0)
MCHC: 32.9 g/dL (ref 30.0–36.0)
MCV: 97.1 fL (ref 80.0–100.0)
Monocytes Absolute: 0.7 10*3/uL (ref 0.1–1.0)
Monocytes Relative: 13 %
Neutro Abs: 3.7 10*3/uL (ref 1.7–7.7)
Neutrophils Relative %: 70 %
Platelet Count: 230 10*3/uL (ref 150–400)
RBC: 4.48 MIL/uL (ref 4.22–5.81)
RDW: 12.8 % (ref 11.5–15.5)
WBC Count: 5.4 10*3/uL (ref 4.0–10.5)
nRBC: 0 % (ref 0.0–0.2)

## 2018-12-25 LAB — SEDIMENTATION RATE: SED RATE: 9 mm/h (ref 0–16)

## 2018-12-25 LAB — CMP (CANCER CENTER ONLY)
ALT: 5 U/L (ref 0–44)
AST: 16 U/L (ref 15–41)
Albumin: 4.6 g/dL (ref 3.5–5.0)
Alkaline Phosphatase: 77 U/L (ref 38–126)
Anion gap: 8 (ref 5–15)
BUN: 24 mg/dL — ABNORMAL HIGH (ref 6–20)
CHLORIDE: 106 mmol/L (ref 98–111)
CO2: 27 mmol/L (ref 22–32)
Calcium: 9.7 mg/dL (ref 8.9–10.3)
Creatinine: 1.25 mg/dL — ABNORMAL HIGH (ref 0.61–1.24)
GFR, Est AFR Am: >60 mL/min (ref >60)
GFR, Estimated: >60 mL/min (ref >60)
Glucose, Bld: 104 mg/dL — ABNORMAL HIGH (ref 70–99)
Potassium: 4.1 mmol/L (ref 3.5–5.1)
Sodium: 141 mmol/L (ref 135–145)
Total Bilirubin: 0.8 mg/dL (ref 0.3–1.2)
Total Protein: 6.8 g/dL (ref 6.5–8.1)

## 2018-12-25 LAB — LACTATE DEHYDROGENASE: LDH: 199 U/L — AB (ref 98–192)

## 2018-12-25 MED ORDER — SODIUM CHLORIDE 0.9 % IV SOLN
Freq: Once | INTRAVENOUS | Status: AC
  Administered 2018-12-25: 11:00:00 via INTRAVENOUS
  Filled 2018-12-25: qty 250

## 2018-12-25 MED ORDER — SODIUM CHLORIDE 0.9 % IV SOLN
480.0000 mg | Freq: Once | INTRAVENOUS | Status: AC
  Administered 2018-12-25: 480 mg via INTRAVENOUS
  Filled 2018-12-25: qty 48

## 2018-12-25 NOTE — Patient Instructions (Signed)
Nivolumab injection What is this medicine? NIVOLUMAB (nye VOL ue mab) is a monoclonal antibody. It is used to treat melanoma, lung cancer, kidney cancer, head and neck cancer, Hodgkin lymphoma, urothelial cancer, colon cancer, and liver cancer. This medicine may be used for other purposes; ask your health care provider or pharmacist if you have questions. COMMON BRAND NAME(S): Opdivo What should I tell my health care provider before I take this medicine? They need to know if you have any of these conditions: -diabetes -immune system problems -kidney disease -liver disease -lung disease -organ transplant -stomach or intestine problems -thyroid disease -an unusual or allergic reaction to nivolumab, other medicines, foods, dyes, or preservatives -pregnant or trying to get pregnant -breast-feeding How should I use this medicine? This medicine is for infusion into a vein. It is given by a health care professional in a hospital or clinic setting. A special MedGuide will be given to you before each treatment. Be sure to read this information carefully each time. Talk to your pediatrician regarding the use of this medicine in children. While this drug may be prescribed for children as young as 12 years for selected conditions, precautions do apply. Overdosage: If you think you have taken too much of this medicine contact a poison control center or emergency room at once. NOTE: This medicine is only for you. Do not share this medicine with others. What if I miss a dose? It is important not to miss your dose. Call your doctor or health care professional if you are unable to keep an appointment. What may interact with this medicine? Interactions have not been studied. Give your health care provider a list of all the medicines, herbs, non-prescription drugs, or dietary supplements you use. Also tell them if you smoke, drink alcohol, or use illegal drugs. Some items may interact with your  medicine. This list may not describe all possible interactions. Give your health care provider a list of all the medicines, herbs, non-prescription drugs, or dietary supplements you use. Also tell them if you smoke, drink alcohol, or use illegal drugs. Some items may interact with your medicine. What should I watch for while using this medicine? This drug may make you feel generally unwell. Continue your course of treatment even though you feel ill unless your doctor tells you to stop. You may need blood work done while you are taking this medicine. Do not become pregnant while taking this medicine or for 5 months after stopping it. Women should inform their doctor if they wish to become pregnant or think they might be pregnant. There is a potential for serious side effects to an unborn child. Talk to your health care professional or pharmacist for more information. Do not breast-feed an infant while taking this medicine or for 5 months after stopping it. What side effects may I notice from receiving this medicine? Side effects that you should report to your doctor or health care professional as soon as possible: -allergic reactions like skin rash, itching or hives, swelling of the face, lips, or tongue -breathing problems -blood in the urine -bloody or watery diarrhea or black, tarry stools -changes in emotions or moods -changes in vision -chest pain -cough -dizziness -feeling faint or lightheaded, falls -fever, chills -headache with fever, neck stiffness, confusion, loss of memory, sensitivity to light, hallucination, loss of contact with reality, or seizures -joint pain -mouth sores -redness, blistering, peeling or loosening of the skin, including inside the mouth -severe muscle pain or weakness -signs and symptoms of   high blood sugar such as dizziness; dry mouth; dry skin; fruity breath; nausea; stomach pain; increased hunger or thirst; increased urination -signs and symptoms of kidney  injury like trouble passing urine or change in the amount of urine -signs and symptoms of liver injury like dark yellow or brown urine; general ill feeling or flu-like symptoms; light-colored stools; loss of appetite; nausea; right upper belly pain; unusually weak or tired; yellowing of the eyes or skin -swelling of the ankles, feet, hands -trouble passing urine or change in the amount of urine -unusually weak or tired -weight gain or loss Side effects that usually do not require medical attention (report to your doctor or health care professional if they continue or are bothersome): -bone pain -constipation -decreased appetite -diarrhea -muscle pain -nausea, vomiting -tiredness This list may not describe all possible side effects. Call your doctor for medical advice about side effects. You may report side effects to FDA at 1-800-FDA-1088. Where should I keep my medicine? This drug is given in a hospital or clinic and will not be stored at home. NOTE: This sheet is a summary. It may not cover all possible information. If you have questions about this medicine, talk to your doctor, pharmacist, or health care provider.  2019 [employer]vier/Gold Standard (2018-04-02 12:55:04)  

## 2018-12-25 NOTE — Progress Notes (Signed)
Hematology and Oncology Follow Up Visit  Brian [name] [id number] 1961/09/21 58 y.o. 12/25/2018   Principle Diagnosis:  Recurrent Hodgkin's Disease -  S/p CAR-T therapy  TIA-resolved  Current Therapy:  Nivolumab q month -- s/p cycle #3       Interim History:  Brian [name] is in for follow-up.  He looks great.  He just got back from [place].  He was there for 10 days.  He had a wonderful time in [place].  It was so fantastic that he can make it over there.  He ate great food.  He went swimming.  He went zip lining.  He just enjoyed himself and forgotten a lot about what has been dealing with with respect to his personal life and with his medical issues.  He is still being followed by [facility].  I think they are still looking at him for protocol therapy with CAR-T intervention.   He has had no pain issues.  He does have Parkinson's-like problems.  This seems to be gotten a little bit better.  He has had no cough.  He has had no fever.  He has had no nausea or vomiting.  There is been no change in bowel or bladder habits.  Overall, his performance status is ECOG 1.    Medications:  Current Outpatient Medications:  .  acetaminophen (TYLENOL) 325 MG tablet, Take 650 mg by mouth daily as needed for mild pain. , Disp: , Rfl:  .  carbidopa-levodopa (SINEMET IR) 25-100 MG tablet, Take 1 tablet by mouth 3 (three) times daily. 1 extra QD PRN, Disp: 360 tablet, Rfl: 1 .  Cholecalciferol (VITAMIN D-3) 1000 UNITS CAPS, Take 1,000 Units by mouth 4 (four) times a week. , Disp: , Rfl:  .  pramipexole (MIRAPEX) 1 MG tablet, TAKE 1 TABLET(1 MG) BY MOUTH THREE TIMES DAILY, Disp: 270 tablet, Rfl: 1 .  sildenafil (REVATIO) 20 MG tablet, Take 1-5 pills daily as needed prior to intercourse (Patient not taking: Reported on 10/30/2018), Disp: 30 tablet, Rfl: 1  Allergies: No Known Allergies  Past Medical History, Surgical history, Social history, and Family History were reviewed and updated.  Review of  Systems: Review of Systems  Neurological: Positive for tremors.  All other systems reviewed and are negative.    Physical Exam:  vitals were not taken for this visit.   Physical Exam Vitals signs reviewed.  HENT:     Head: Normocephalic and atraumatic.  Eyes:     Pupils: Pupils are equal, round, and reactive to light.  Neck:     Musculoskeletal: Normal range of motion.  Cardiovascular:     Rate and Rhythm: Normal rate and regular rhythm.     Heart sounds: Normal heart sounds.  Pulmonary:     Effort: Pulmonary effort is normal.     Breath sounds: Normal breath sounds.  Abdominal:     General: Bowel sounds are normal.     Palpations: Abdomen is soft.  Musculoskeletal: Normal range of motion.        General: No tenderness or deformity.  Lymphadenopathy:     Cervical: No cervical adenopathy.  Skin:    General: Skin is warm and dry.     Findings: No erythema or rash.  Neurological:     Mental Status: He is alert and oriented to person, place, and time.  Psychiatric:        Behavior: Behavior normal.        Thought Content: Thought content normal.  Judgment: Judgment normal.     Lab Results  Component Value Date   WBC 5.4 12/25/2018   HGB 14.3 12/25/2018   HCT 43.5 12/25/2018   MCV 97.1 12/25/2018   PLT 230 12/25/2018     Chemistry      Component Value Date/Time   NA 141 12/25/2018 0929   NA 142 04/02/2017   NA 140 07/19/2016 1305   K 4.1 12/25/2018 0929   K 4.4 07/19/2016 1305   CL 106 12/25/2018 0929   CL 105 02/08/2016 1117   CL 107 03/25/2013 0828   CO2 27 12/25/2018 0929   CO2 26 07/19/2016 1305   BUN 24 (H) 12/25/2018 0929   BUN 21 04/02/2017   BUN 22.8 07/19/2016 1305   CREATININE 1.25 (H) 12/25/2018 0929   CREATININE 1.1 07/19/2016 1305   GLU 105 04/02/2017      Component Value Date/Time   CALCIUM 9.7 12/25/2018 0929   CALCIUM 9.8 07/19/2016 1305   ALKPHOS 77 12/25/2018 0929   ALKPHOS 76 07/19/2016 1305   AST 16 12/25/2018 0929    AST 18 07/19/2016 1305   ALT 5 12/25/2018 0929   ALT 14 07/19/2016 1305   BILITOT 0.8 12/25/2018 0929   BILITOT 0.70 07/19/2016 1305         Impression and Plan: Brian [name] is a 58 year old gentleman.  He has history of recurrent Hodgkin's disease. 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 He then had a recurrence after this.  He did undergo CAR-T therapy at [facility].  He did well with this.  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  This was proven by biopsy.  He had radiation therapy for this.  We will continue him on the nivolumab.  He is due for a PET scan in a week or so.  I am not sure exactly what [facility] wants to do with him.  I think as long as we see him responding, then we will continue him on nivolumab.  I will plan to get him back in 4 weeks.     [name], MD 1/30/202010:11 AM

## 2018-12-31 ENCOUNTER — Encounter: Payer: Self-pay | Admitting: Hematology & Oncology

## 2019-01-05 ENCOUNTER — Ambulatory Visit (HOSPITAL_COMMUNITY)
Admission: RE | Admit: 2019-01-05 | Discharge: 2019-01-05 | Disposition: A | Discharge status: Home / Self Care | Payer: Commercial Managed Care - PPO | Source: Ambulatory Visit | Attending: Hematology & Oncology | Admitting: Hematology & Oncology

## 2019-01-05 DIAGNOSIS — C8190 Hodgkin lymphoma, unspecified, unspecified site: Secondary | ICD-10-CM | POA: Diagnosis not present

## 2019-01-05 DIAGNOSIS — C8195 Hodgkin lymphoma, unspecified, lymph nodes of inguinal region and lower limb: Secondary | ICD-10-CM | POA: Insufficient documentation

## 2019-01-05 LAB — GLUCOSE, CAPILLARY: GLUCOSE-CAPILLARY: 101 mg/dL — AB (ref 70–99)

## 2019-01-05 MED ORDER — FLUDEOXYGLUCOSE F - 18 (FDG) INJECTION
9.2900 | Freq: Once | INTRAVENOUS | Status: AC | PRN
  Administered 2019-01-05: 9.29 via INTRAVENOUS

## 2019-01-05 NOTE — Progress Notes (Signed)
Brian [name] was seen today in the movement disorders clinic for neurologic consultation at the request of Copland, [name], MD.  The consultation is for the evaluation of parkinsonism.  I have reviewed numerous records made available to me.  The patient previously saw Dr. [name] and subsequently Dr. [name].  He saw Dr. [name] when he was in the hospital in April, 2016.  That was for an event in which he experienced 1-2 minutes of speech and word finding trouble.  Dr. [name] doubted that was a TIA.  He had an EEG that was negative.  He had an MRI of the brain on 03/04/2015 without gadolinium that I had the opportunity to review.  There were a very few scattered T2 hyperintensities, but diffusion-weighted imaging was negative.  MRSA was negative.  He followed up with Dr. [name] one time on an outpatient basis, but nothing further was revealed.  Today, the patient presents because of shuffling of the feet for about 8 months.  Pt states that it started dragging of the L foot but now seems to be dragging both.  He has also noted handwriting and voice changes and has become concerned about the possibility of Parkinson's disease.  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  He also has a history of chemotherapy-induced peripheral neuropathy (Adcetris).  He asks about "psychogenic stuff."  States that he has been through divorce along with his cancer.  Asks if his sx's could be from stress as he has been reading about psychogenic manifestations of sx's.  08/22/16 update: The patient follows up today for testing results.  This patient is accompanied in the office by his ex wife who supplements the history.  He had a DaT scan done on 08/16/16 and showed abnormal grade 3 uptake, virtually absent bilaterally in both putamen and caudate nuclei.    He has not had any falls since last visit.  Continues to have difficulty with picking up the feet.  He denies lightheadedness or near syncope.  He denies hallucinations.  He continues to have issues with depression.  No SI/HI.  Ex wife concerned about mood.  Also noting stiffness, some drooling, constipation.    11/27/16 update: The patient follows up today, accompanied by his ex-wife (and friend) who supplements the history.  The patient was started on pramipexole last visit and worked up to 0.5 mg 3 times per day.  He denies any compulsive behaviors or sleep attacks.  He states that he is doing better but not as good as he thought that he would.  He is shuffling some.  He is exercising with his bike.  He has been only to 2 physical therapy sessions.  He declined speech and occupational therapy, despite the fact that the therapist recommended them as did I.  He has not had any falls.  Denies lightheadedness or syncope.  He continues to struggle with depression but states that mood has been better.  Last visit, he was given names of psychiatrist but refused to go, stating that he did not need a psychiatrist and had a Social worker.  He called me for a prescription for Xanax, but this is not a medication that I generally recommended my Parkinson's patients.  03/05/17 update:  Patient follows up today.  He is unaccompanied today.   The  patient is on pramipexole but we increased that last visit to 1.0 mg tid.  No falls but some near falls.  He is doing rock steady one day a week.  There've been no compulsive behaviors.  No sleep attacks.  No hallucinations.  No lightheadedness or near syncope.  He was seen last by the bone marrow transplant team at [facility] in January, 2018.  I have reviewed those records.  He is currently disease-free from a lymphoma standpoint.  07/09/17 update:  Pt f/u today for PD.  The records that were made available to me were reviewed since last visit.  He had a repeat PET scan at [facility] that  was also interpreted at [facility].  There were hypermetabolic nodes in the mediastinal region.  Findings were concerning for recurrence of lymphoma.  Pt states that the plan is to repeat it in September.  In progress to Parkinson's disease, the patient is still on pramipexole 1.0 mg tid.  He is feeling like he is always walking on the balls of the feet and describing a festinating gait.  He has some feeling of retropulsion.  No compulsive behaviors.  No falls.  No lightheadedness/near syncope.  He is exercising on his bike a few times per week.  He is "profusely sweating."  Did fall out of bed onto the nightstand while asleep.  11/07/17 update:  Pt seen in f/u for PD.  The records that were made available to me were reviewed.  Had a PET scan demonstrating hypermetabolic lesion at T1.  Had MRI thoracic spine that confirmed focal metastatic disease.  Ultimately had a bx to confirm it was return of lymphoma and bx was suggestive of such.  Has been receiving focal radiation to that area and finished that on tuesday.  In regards to PD he is on pramipexole 1.0 mg tid and last visit we started carbidopa/levodopa 25/100 tid.  He wanted a 2nd opinion at [facility] interdisciplinary clinic.  It appears that this didn't happen (don't think that referral got placed).  Pt states that he decided he actually didn't want to go.  He is doing much better with the addition of carbidopa/levodopa so he didn't feel the need to go.  He is doing 5-6 miles on his bike a few times a week.  Pt denies falls.  Pt denies lightheadedness, near syncope.  No hallucinations.  Mood has been good.  04/03/18 update: Patient is seen today for follow-up for his Parkinson's disease.  I have reviewed records since our last visit.  The patient remains on carbidopa/levodopa 25/100, 1 tablet 3 times per day and pramipexole 1 mg 3 times per day.  He has not had any falls.  No lightheadedness or near syncope except when sometimes bends over in the middle of exercise he  will have a near syncopal feeling that goes away a min or so after he stands back up. Only does it with exercise.  Denies compulsive behaviors.   He has attended a new group for younger patients with Parkinson's.  Patient had a PET scan last on January 28, 2018 and scheduled for a repeat on May 01, 2018.  Last one demonstrated resolution of the focal hypermetabolic activity in the left transverse process of T1.  There was new focal uptake in the right lateral fourth rib, but this was without corresponding abnormality on CT.  08/21/18 update: Patient is seen today for follow-up regarding Parkinson's disease.  He is on carbidopa/levodopa 25/100, 1 tablet 3 times per day and  pramipexole 1 mg 3 times per day.  He does note hypersexual behavior but no prostitution.   He doesn't want to change medication.  No falls.  No sleep attacks.  No compulsive behaviors.  He had one incident while he was out jogging and he stopped to get his breath and was holding onto a pole and the next thing he was on his bottom.  He thinks that he was only out 5 seconds.  He hadn't eaten or drank anything but coffee that day.  That was about a month ago.  He does not some dizziness with bending over but not all the time. No hallucinations.  Reports depression based on a rocky relationship with girlfriend.  No SI/HI.  Notes that the stress will affect him physically with more shuffling.  More drooling and its frustrating but doesn't want med/botox.  Asks about marijuana/cbd.  01/06/19 update: Patient is seen today in follow-up for Parkinson's disease.  Patient is on carbidopa/levodopa 25/100, 1 tablet 3 times per day and pramipexole 1 mg 3 times per day.  In the past, he has had some hypersexual behavior on pramipexole.  He states that this continues to be appropriate and within relationships.  He did just recently returned from [place] and found that to be a very relaxing vacation.  He has not been exercising as much as he would like.  He gets  frustrated that the left foot seems to not work like it is supposed to, but attributes some of this to neuropathy.  He actually is going for acupuncture because of the uncomfortable nature of the foot.  He has bought bigger shoes because of this.  Records are reviewed since last visit.  He just saw Dr. Marin Olp on December 25, 2018.  No changes were made, but he continues to be seen at [facility].  He had a PET scan yesterday that was normal.  He admits to depression, but states that seems to come and go.  ALLERGIES:  No Known Allergies  CURRENT MEDICATIONS:  Outpatient Encounter Medications as of 01/06/2019  Medication Sig  . acetaminophen (TYLENOL) 325 MG tablet Take 650 mg by mouth daily as needed for mild pain.   . carbidopa-levodopa (SINEMET IR) 25-100 MG tablet Take 1 tablet by mouth 3 (three) times daily. 1 extra QD PRN  . Cholecalciferol (VITAMIN D-3) 1000 UNITS CAPS Take 1,000 Units by mouth 4 (four) times a week.   . pramipexole (MIRAPEX) 1 MG tablet TAKE 1 TABLET(1 MG) BY MOUTH THREE TIMES DAILY  . sildenafil (REVATIO) 20 MG tablet Take 1-5 pills daily as needed prior to intercourse   No facility-administered encounter medications on file as of 01/06/2019.     PAST MEDICAL HISTORY:   Past Medical History:  Diagnosis Date  . Anxiety   . Dysrhythmia    past hx pvc  . Goals of care, counseling/discussion 09/25/2018  . H/O autologous stem cell transplant ([place])    may 2014  at [facility]  . History of Bell's palsy    2009  RIGHT SIDE--  HAS 80% FUNCTION / PT STATES A LITTLE ASYMETRICAL AND EFFECTS MOUTH  . History of radiation therapy 10/18/17-11/05/17   sprine T1 26 Gy in 13 fractions, spine boost 10 Gy in 5 fractions  . Hodgkin's disease, nodular sclerosis, of inguinal region/lower limb ([place]) ONOLOGIST--  DR [facility]NE[facility] AND A [facility]     SALVAGE CHEMO 2013/  AUTOLOGUS STEM CELL TRANSPLANT MAY 2014 AT [facility]  . Mass of right chest wall 09/18/2018  .  Mass of right inguinal region   . PVC (premature  ventricular contraction)    "benign"  . TIA (transient ischemic attack) 02/2015   "probable TIA"  . Wears contact lenses     PAST SURGICAL HISTORY:   Past Surgical History:  Procedure Laterality Date  . AXILLARY LYMPH NODE BIOPSY Left 02/02/2013   Procedure: NEEDLE LOCALIZED AXILLARY LYMPH NODE BIOPSY;  Surgeon: Haywood Lasso, MD;  Location: [place];  Service: General;  Laterality: Left;  . BONE MARROW BIOPSY  08/2012  . CYST REMOVAL NECK Left 1980  . LEFT INGUINAL LYMPH NODE BX  09-09-2012  . LYMPH NODE BIOPSY N/A 07/28/2015   Procedure: LYMPH NODE BIOPSY;  Surgeon: Melrose Nakayama, MD;  Location: [place];  Service: Thoracic;  Laterality: N/A;  . MASS EXCISION Right 09/19/2018   Procedure: EXCISION OF RIGHT CHEST WALL MASS ERAS PATHWAY;  Surgeon: Fanny Skates, MD;  Location: [place];  Service: General;  Laterality: Right;  . NODE DISSECTION Right 07/28/2015   Procedure: NODE DISSECTION;  Surgeon: Melrose Nakayama, MD;  Location: [place];  Service: Thoracic;  Laterality: Right;  . PLEURA BIOPSY Left 05/31/2014  . REMOVAL RIGHT INGUINAL LYMPH NODES  08-16-2011  . SCROTAL EXPLORATION Right 01/04/2014   Procedure: SCROTUM EXPLORATION   INGUINAL , EXCISION OF CYSTIC MASS OF RIGHT SPERMATIC CORD, WITH FROZEN SECTION;  Surgeon: Franchot Gallo, MD;  Location: [facility];  Service: Urology;  Laterality: Right;  . TRANSTHORACIC ECHOCARDIOGRAM  03-18-2013   MILD LVH/  EF 55-60%  . VIDEO ASSISTED THORACOSCOPY Left 05/31/2014   Procedure: LEFT VIDEO ASSISTED THORACOSCOPY, PLEURAL BIOPSY;  Surgeon: Melrose Nakayama, MD;  Location: [place];  Service: Thoracic;  Laterality: Left;  Marland Kitchen VIDEO ASSISTED THORACOSCOPY Right 07/28/2015   Procedure: RIGHT VIDEO ASSISTED THORACOSCOPY;  Surgeon: Melrose Nakayama, MD;  Location: [place];  Service: Thoracic;  Laterality: Right;  Marland Kitchen VIDEO ASSISTED THORACOSCOPY (VATS)/ LYMPH NODE SAMPLING Right 07/28/2015  .  VIDEO ASSISTED THORACOSCOPY (VATS)/WEDGE RESECTION  05/31/2014  . VIDEO BRONCHOSCOPY WITH ENDOBRONCHIAL ULTRASOUND  07/28/2015  . VIDEO BRONCHOSCOPY WITH ENDOBRONCHIAL ULTRASOUND N/A 07/28/2015   Procedure: VIDEO BRONCHOSCOPY WITH ENDOBRONCHIAL ULTRASOUND;  Surgeon: Melrose Nakayama, MD;  Location: [place];  Service: Thoracic;  Laterality: N/A;    SOCIAL HISTORY:   Social History   Socioeconomic History  . Marital status: Divorced    Spouse name: Not on file  . Number of children: Not on file  . Years of education: Not on file  . Highest education level: Not on file  Occupational History  . Occupation: Oncologist: [employer]  . Financial resource strain: Not on file  . Food insecurity:    Worry: Not on file    Inability: Not on file  . Transportation needs:    Medical: Not on file    Non-medical: Not on file  Tobacco Use  . Smoking status: Never Smoker  . Smokeless tobacco: Never Used  Substance and Sexual Activity  . Alcohol use: Yes    Alcohol/week: 14.0 standard drinks    Types: 7 Cans of beer, 7 Glasses of wine per week  . Drug use: No  . Sexual activity: Not Currently  Lifestyle  . Physical activity:    Days per week: Not on file    Minutes per session: Not on file  . Stress: Not on file  Relationships  . Social connections:    Talks  on phone: Not on file    Gets together: Not on file    Attends religious service: Not on file    Active member of club or organization: Not on file    Attends meetings of clubs or organizations: Not on file    Relationship status: Not on file  . Intimate partner violence:    Fear of current or ex partner: Not on file    Emotionally abused: Not on file    Physically abused: Not on file    Forced sexual activity: Not on file  Other Topics Concern  . Not on file  Social History Narrative   Married. Education: [employer]. Exercise: jog/walk/ lift weights 7 days a week, 1-2 miles.    FAMILY HISTORY:     Family Status  Relation Name Status  . Mother  Deceased  . Father  Deceased  . Sister  Alive  . Brother 3 Alive  . Son 2 Alive    ROS:  Review of Systems  Constitutional: Negative.   HENT: Negative.   Eyes: Negative.   Respiratory: Negative.   Cardiovascular: Negative.   Gastrointestinal: Negative.   Genitourinary: Negative.   Skin: Negative.   Psychiatric/Behavioral: Positive for depression.     PHYSICAL EXAMINATION:    VITALS:   Vitals:   01/06/19 1118  BP: 120/80  Pulse: 76  SpO2: 98%  Weight: 188 lb 2 oz (85.3 kg)  Height: _0  (1.778 m)       GEN:  The patient appears stated age and is in NAD.   HEENT:  Normocephalic, atraumatic.  The mucous membranes are moist. The superficial temporal arteries are without ropiness or tenderness. CV:    Tachycardic.  regular Lungs:  CTAB Neck/HEME:  There are no carotid bruits bilaterally.  Neurological examination:  Orientation: The patient is alert and oriented x3. Fund of knowledge is appropriate.  Cranial nerves: There is slight right facial droop, only noticeable because of decreased nasolabial fold on the right.  He is able to symmetrically activate the muscles of facial expression.  He does have abnormal synkinetic reinnervation of the muscles of facial expression on the right.  He does not have a Bell's phenomenon.  There is facial hypomimia.    The speech is fluent and clear.    Soft palate rises symmetrically and there is no tongue deviation. Hearing is intact to conversational tone. Sensation: Sensation is intact to light throughout.   Movement examination: Tone: There is mild to moderate rigidity in the left upper extremity (it is time for him to take medicine). Abnormal movements: None Coordination:  There is decremation only with heel and toe taps on the left. Gait and Station: The patient has no difficulty arising out of a deep-seated chair without the use of the hands. The patient's stride length is  normal today and he is not dragging the leg.  He actually runs down the hall quite well.    Chemistry      Component Value Date/Time   NA 141 12/25/2018 0929   NA 142 04/02/2017   NA 140 07/19/2016 1305   K 4.1 12/25/2018 0929   K 4.4 07/19/2016 1305   CL 106 12/25/2018 0929   CL 105 02/08/2016 1117   CL 107 03/25/2013 0828   CO2 27 12/25/2018 0929   CO2 26 07/19/2016 1305   BUN 24 (H) 12/25/2018 0929   BUN 21 04/02/2017   BUN 22.8 07/19/2016 1305   CREATININE 1.25 (H) 12/25/2018 5208  CREATININE 1.1 07/19/2016 1305   GLU 105 04/02/2017      Component Value Date/Time   CALCIUM 9.7 12/25/2018 0929   CALCIUM 9.8 07/19/2016 1305   ALKPHOS 77 12/25/2018 0929   ALKPHOS 76 07/19/2016 1305   AST 16 12/25/2018 0929   AST 18 07/19/2016 1305   ALT 5 12/25/2018 0929   ALT 14 07/19/2016 1305   BILITOT 0.8 12/25/2018 0929   BILITOT 0.70 07/19/2016 1305       ASSESSMENT/PLAN:  1.   Parkinson's disease.  -The patient had a DaT scan on 08/16/2016 that showed abnormal grade 3 uptake, virtually absent bilaterally in both putamen and caudate nuclei.   -We discussed the diagnosis as well as pathophysiology of the disease.  We discussed treatment options as well as prognostic indicators.  Patient education was provided.  -Patient does seem to have some hypersexual behavior from pramipexole, but does not want to change the medication.  Hypersexual behavior is not occurring outside of appropriate relationships.  He is aware that that behavior could be from pramipexole.  He is monitoring closely and is very open with me about this.  For now, he will continue pramipexole, 1 mg 3 times per day.  -continue carbidopa/levodopa 25/100 tid.  I did tell him he could take 1 extra of these throughout the day if he needed it (bad day).  Risks, benefits, side effects and alternative therapies were discussed, including but limited to dyskinesia.  The opportunity to ask questions was given and they were  answered to the best of my ability.  The patient expressed understanding and willingness to follow the outlined treatment protocols.  -Discussed again the importance of getting back to safe, cardiovascular exercise.  -He asked about DBS therapy today.  He certainly does not need this at this point in time, but we did discuss the therapy.  2.  Chemo (adcetris) induced peripheral neuropathy  -The patient's neuropathy is stable, although somewhat bothersome to him.  He plans to have acupuncture for this.  I told him I am not sure that this is going to help, and there is certainly no scientific data on this, but he is going to try as his feet are uncomfortable.  3.  R facial droop due to hx of cranial nerve 7 palsy  -has had negative neuroimaging (noncontrast)  -He does have abnormal synkinetic reinnervation of the right face, but this was actually somewhat better today.  4.  Hodgkins lymphoma, s/p autologous transplant with recurrence post transplant  -Repeat PET scan done yesterday was normal.  5.  2016 transient (1-2 min) of word/speech trouble  -reviewed hospital records and agree that this likely did not represent TIA and if anything, more consistent with seizure.  In any case, the event has not recurred.  6.  Depression  -Does recognize that he has intermittent depression, but is doing well and does not want any medication for this.  Discussed this again today.  7.  RBD  -No further episodes of falling out of the bed.  Does not want clonazepam.  8. Sialorrhea  -This is commonly associated with PD.  We talked about treatments.  The patient is not a candidate for oral anticholinergic therapy because of increased risk of confusion and falls.  We discussed Botox (type A and B) and 1% atropine drops.  We discusssed that candy like lemon drops can help by stimulating mm of the oropharynx to induce swallowing.  He does not want more medication nor does he want  Botox.  9.  Follow up is  anticipated in the next 6 months, sooner should new neurologic issues arise.

## 2019-01-06 ENCOUNTER — Encounter: Payer: Self-pay | Admitting: Neurology

## 2019-01-06 ENCOUNTER — Ambulatory Visit (INDEPENDENT_AMBULATORY_CARE_PROVIDER_SITE_OTHER): Payer: Commercial Managed Care - PPO | Admitting: Neurology

## 2019-01-06 VITALS — BP 120/80 | HR 76 | Ht 70.0 in | Wt 188.1 lb

## 2019-01-06 DIAGNOSIS — F330 Major depressive disorder, recurrent, mild: Secondary | ICD-10-CM

## 2019-01-06 DIAGNOSIS — G20 Parkinson's disease: Secondary | ICD-10-CM

## 2019-01-06 DIAGNOSIS — C8195 Hodgkin lymphoma, unspecified, lymph nodes of inguinal region and lower limb: Secondary | ICD-10-CM

## 2019-01-06 NOTE — Patient Instructions (Signed)
The physicians and staff at [facility] are committed to providing excellent care. You may receive a survey requesting feedback about your experience at our office. We strive to receive "very good" responses to the survey questions. If you feel that your experience would prevent you from giving the office a "very good " response, please contact our office to try to remedy the situation. We may be reached at [phone]. Thank you for taking the time out of your busy day to complete the survey.  

## 2019-01-10 ENCOUNTER — Other Ambulatory Visit: Payer: Self-pay | Admitting: Nurse Practitioner

## 2019-01-20 ENCOUNTER — Encounter: Payer: Self-pay | Admitting: Family Medicine

## 2019-01-20 ENCOUNTER — Telehealth: Payer: Self-pay

## 2019-01-20 NOTE — Telephone Encounter (Signed)
I do not have any form on this patient. Do either of you?

## 2019-01-20 NOTE — Telephone Encounter (Signed)
Paperwork received, provider will RTO tomorrow/SLS 02/25

## 2019-01-20 NOTE — Telephone Encounter (Signed)
Copied from [place] [id number]. Topic: General - Other >> Jan 20, 2019 10:39 AM Carolyn Stare wrote:  Pt call to say he faxed over a health form for Dr Lorelei Pont to sign and fax back and he is asking if the form was received by the office . Fax number [phone]

## 2019-01-20 NOTE — Telephone Encounter (Addendum)
Relation to pt: self  Call back number: [phone]   Reason for call:  [place] of Physical Form re faxed to [phone] and confirmed it was received by Ivin Booty. Please re fax form back to [phone] and upload to My Chart.

## 2019-01-22 ENCOUNTER — Inpatient Hospital Stay: Payer: Commercial Managed Care - PPO

## 2019-01-22 ENCOUNTER — Other Ambulatory Visit: Payer: Self-pay

## 2019-01-22 ENCOUNTER — Inpatient Hospital Stay: Payer: Commercial Managed Care - PPO | Attending: Hematology & Oncology | Admitting: Hematology & Oncology

## 2019-01-22 ENCOUNTER — Telehealth: Payer: Self-pay | Admitting: *Deleted

## 2019-01-22 ENCOUNTER — Encounter: Payer: Self-pay | Admitting: Hematology & Oncology

## 2019-01-22 VITALS — BP 137/78 | HR 77 | Temp 98.5°F | Resp 18 | Wt 189.5 lb

## 2019-01-22 DIAGNOSIS — Z9484 Stem cells transplant status: Secondary | ICD-10-CM | POA: Insufficient documentation

## 2019-01-22 DIAGNOSIS — C8199 Hodgkin lymphoma, unspecified, extranodal and solid organ sites: Secondary | ICD-10-CM | POA: Diagnosis not present

## 2019-01-22 DIAGNOSIS — Z5112 Encounter for antineoplastic immunotherapy: Secondary | ICD-10-CM | POA: Diagnosis present

## 2019-01-22 DIAGNOSIS — C8195 Hodgkin lymphoma, unspecified, lymph nodes of inguinal region and lower limb: Secondary | ICD-10-CM

## 2019-01-22 DIAGNOSIS — G20 Parkinson's disease: Secondary | ICD-10-CM

## 2019-01-22 DIAGNOSIS — C8190 Hodgkin lymphoma, unspecified, unspecified site: Secondary | ICD-10-CM

## 2019-01-22 DIAGNOSIS — Z79899 Other long term (current) drug therapy: Secondary | ICD-10-CM | POA: Insufficient documentation

## 2019-01-22 DIAGNOSIS — Z923 Personal history of irradiation: Secondary | ICD-10-CM | POA: Diagnosis not present

## 2019-01-22 LAB — CBC WITH DIFFERENTIAL (CANCER CENTER ONLY)
Abs Immature Granulocytes: 0.02 10*3/uL (ref 0.00–0.07)
Basophils Absolute: 0.1 10*3/uL (ref 0.0–0.1)
Basophils Relative: 1 %
Eosinophils Absolute: 0.2 10*3/uL (ref 0.0–0.5)
Eosinophils Relative: 3 %
HCT: 42.5 % (ref 39.0–52.0)
Hemoglobin: 14 g/dL (ref 13.0–17.0)
Immature Granulocytes: 0 %
Lymphocytes Relative: 11 %
Lymphs Abs: 0.6 10*3/uL — ABNORMAL LOW (ref 0.7–4.0)
MCH: 32 pg (ref 26.0–34.0)
MCHC: 32.9 g/dL (ref 30.0–36.0)
MCV: 97.3 fL (ref 80.0–100.0)
MONO ABS: 0.6 10*3/uL (ref 0.1–1.0)
Monocytes Relative: 11 %
Neutro Abs: 3.8 10*3/uL (ref 1.7–7.7)
Neutrophils Relative %: 74 %
Platelet Count: 215 10*3/uL (ref 150–400)
RBC: 4.37 MIL/uL (ref 4.22–5.81)
RDW: 12.5 % (ref 11.5–15.5)
WBC: 5.2 10*3/uL (ref 4.0–10.5)
nRBC: 0 % (ref 0.0–0.2)

## 2019-01-22 LAB — CMP (CANCER CENTER ONLY)
ALT: 7 U/L (ref 0–44)
AST: 16 U/L (ref 15–41)
Albumin: 4.4 g/dL (ref 3.5–5.0)
Alkaline Phosphatase: 74 U/L (ref 38–126)
Anion gap: 7 (ref 5–15)
BUN: 27 mg/dL — ABNORMAL HIGH (ref 6–20)
CO2: 28 mmol/L (ref 22–32)
Calcium: 9.3 mg/dL (ref 8.9–10.3)
Chloride: 106 mmol/L (ref 98–111)
Creatinine: 1.13 mg/dL (ref 0.61–1.24)
GFR, Est AFR Am: >60 mL/min (ref >60)
GFR, Estimated: >60 mL/min (ref >60)
GLUCOSE: 88 mg/dL (ref 70–99)
Potassium: 4.4 mmol/L (ref 3.5–5.1)
Sodium: 141 mmol/L (ref 135–145)
Total Bilirubin: 0.5 mg/dL (ref 0.3–1.2)
Total Protein: 6.8 g/dL (ref 6.5–8.1)

## 2019-01-22 LAB — LACTATE DEHYDROGENASE: LDH: 187 U/L (ref 98–192)

## 2019-01-22 MED ORDER — SODIUM CHLORIDE 0.9 % IV SOLN
Freq: Once | INTRAVENOUS | Status: AC
  Administered 2019-01-22: 12:00:00 via INTRAVENOUS
  Filled 2019-01-22: qty 250

## 2019-01-22 MED ORDER — SODIUM CHLORIDE 0.9 % IV SOLN
480.0000 mg | Freq: Once | INTRAVENOUS | Status: AC
  Administered 2019-01-22: 480 mg via INTRAVENOUS
  Filled 2019-01-22: qty 48

## 2019-01-22 NOTE — Telephone Encounter (Signed)
Received CPE verification form, completed as much as possible; forwarded to provider/SLS 02/27

## 2019-01-22 NOTE — Progress Notes (Signed)
Hematology and Oncology Follow Up Visit  Brian [name] [id number] June 09, 1961 58 y.o. 01/22/2019   Principle Diagnosis:  Recurrent Hodgkin's Disease -  S/p CAR-T therapy  TIA-resolved  Current Therapy:  Nivolumab q month -- s/p cycle #4       Interim History:  [name] is in for follow-up.  He is doing great.  He is still talking about a great trip he had to [place].  He was over there back in January.  He now has a new lady friend.  This also is improving his quality of life.  He is thinking about going to [employer] and joining.  I encouraged him aggressively to do this.  He really needs to work out.  I think this will help his neuropathy.  I think we will just make him feel better overall.  Most importantly is the fact that his last PET scan which was done on February 10 did not show any active Hodgkin's disease.  He had resolution of his Hodgkin's disease in the right axilla as well as the right sixth rib.  I told him that I would keep him on Opdivo as long as it is working.  He is still working.  He is doing well at work.  He has had no change in bowel or bladder habits.  His Parkinson's seems to be doing a little bit better.  Again, I think if he went to [employer] and worked out I think the Parkinson is would improve significantly.    Overall, his performance status is ECOG 1.    Medications:  Current Outpatient Medications:  .  acetaminophen (TYLENOL) 325 MG tablet, Take 650 mg by mouth daily as needed for mild pain. , Disp: , Rfl:  .  carbidopa-levodopa (SINEMET IR) 25-100 MG tablet, Take 1 tablet by mouth 3 (three) times daily. 1 extra QD PRN, Disp: 360 tablet, Rfl: 1 .  Cholecalciferol (VITAMIN D-3) 1000 UNITS CAPS, Take 1,000 Units by mouth 4 (four) times a week. , Disp: , Rfl:  .  pramipexole (MIRAPEX) 1 MG tablet, TAKE 1 TABLET(1 MG) BY MOUTH THREE TIMES DAILY, Disp: 270 tablet, Rfl: 1 .  sildenafil (REVATIO) 20 MG tablet, Take 1-5 pills daily as needed  prior to intercourse, Disp: 30 tablet, Rfl: 1  Allergies: No Known Allergies  Past Medical History, Surgical history, Social history, and Family History were reviewed and updated.  Review of Systems: Review of Systems  Neurological: Positive for tremors.  All other systems reviewed and are negative.    Physical Exam:  weight is 189 lb 8 oz (86 kg). His oral temperature is 98.5 F (36.9 C). His blood pressure is 137/78 and his pulse is 77. His respiration is 18 and oxygen saturation is 99%.   Physical Exam Vitals signs reviewed.  HENT:     Head: Normocephalic and atraumatic.  Eyes:     Pupils: Pupils are equal, round, and reactive to light.  Neck:     Musculoskeletal: Normal range of motion.  Cardiovascular:     Rate and Rhythm: Normal rate and regular rhythm.     Heart sounds: Normal heart sounds.  Pulmonary:     Effort: Pulmonary effort is normal.     Breath sounds: Normal breath sounds.  Abdominal:     General: Bowel sounds are normal.     Palpations: Abdomen is soft.  Musculoskeletal: Normal range of motion.        General: No tenderness or deformity.  Lymphadenopathy:  Cervical: No cervical adenopathy.  Skin:    General: Skin is warm and dry.     Findings: No erythema or rash.  Neurological:     Mental Status: He is alert and oriented to person, place, and time.  Psychiatric:        Behavior: Behavior normal.        Thought Content: Thought content normal.        Judgment: Judgment normal.     Lab Results  Component Value Date   WBC 5.2 01/22/2019   HGB 14.0 01/22/2019   HCT 42.5 01/22/2019   MCV 97.3 01/22/2019   PLT 215 01/22/2019     Chemistry      Component Value Date/Time   NA 141 01/22/2019 1054   NA 142 04/02/2017   NA 140 07/19/2016 1305   K 4.4 01/22/2019 1054   K 4.4 07/19/2016 1305   CL 106 01/22/2019 1054   CL 105 02/08/2016 1117   CL 107 03/25/2013 0828   CO2 28 01/22/2019 1054   CO2 26 07/19/2016 1305   BUN 27 (H) 01/22/2019  1054   BUN 21 04/02/2017   BUN 22.8 07/19/2016 1305   CREATININE 1.13 01/22/2019 1054   CREATININE 1.1 07/19/2016 1305   GLU 105 04/02/2017      Component Value Date/Time   CALCIUM 9.3 01/22/2019 1054   CALCIUM 9.8 07/19/2016 1305   ALKPHOS 74 01/22/2019 1054   ALKPHOS 76 07/19/2016 1305   AST 16 01/22/2019 1054   AST 18 07/19/2016 1305   ALT 7 01/22/2019 1054   ALT 14 07/19/2016 1305   BILITOT 0.5 01/22/2019 1054   BILITOT 0.70 07/19/2016 1305         Impression and Plan: [name] is a 58 year old gentleman.  He has history of recurrent Hodgkin's disease. 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 He then had a recurrence after this.  He did undergo CAR-T therapy at [facility].  He did well with this.  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  This was proven by biopsy.  He had radiation therapy for this.  He will get his nivolumab today.  I do not think we have to do another PET scan on him until June.  We will have him come back in 1 month.  Hopefully, he will tell me that he is now working out.      [name], MD 2/27/202011:39 AM

## 2019-01-22 NOTE — Patient Instructions (Signed)
[facility] Discharge Instructions for Patients Receiving Chemotherapy  Today you received the following chemotherapy agents:  Nivolumab  To help prevent nausea and vomiting after your treatment, we encourage you to take your nausea medication as prescribed.   If you develop nausea and vomiting that is not controlled by your nausea medication, call the clinic.   BELOW ARE SYMPTOMS THAT SHOULD BE REPORTED IMMEDIATELY:  *FEVER GREATER THAN 100.5 F  *CHILLS WITH OR WITHOUT FEVER  NAUSEA AND VOMITING THAT IS NOT CONTROLLED WITH YOUR NAUSEA MEDICATION  *UNUSUAL SHORTNESS OF BREATH  *UNUSUAL BRUISING OR BLEEDING  TENDERNESS IN MOUTH AND THROAT WITH OR WITHOUT PRESENCE OF ULCERS  *URINARY PROBLEMS  *BOWEL PROBLEMS  UNUSUAL RASH Items with * indicate a potential emergency and should be followed up as soon as possible.  Feel free to call the clinic should you have any questions or concerns. The clinic phone number is [phone].  Please show the CHEMO ALERT CARD at check-in to the Emergency Department and triage nurse.   

## 2019-01-23 LAB — TSH: TSH: 2.375 u[IU]/mL (ref 0.320–4.118)

## 2019-02-16 ENCOUNTER — Encounter: Payer: Self-pay | Admitting: Hematology & Oncology

## 2019-02-19 ENCOUNTER — Other Ambulatory Visit: Payer: Commercial Managed Care - PPO

## 2019-02-19 ENCOUNTER — Inpatient Hospital Stay: Payer: Commercial Managed Care - PPO | Attending: Hematology & Oncology | Admitting: Hematology & Oncology

## 2019-02-19 ENCOUNTER — Encounter: Payer: Self-pay | Admitting: Hematology & Oncology

## 2019-02-19 ENCOUNTER — Inpatient Hospital Stay: Payer: Commercial Managed Care - PPO

## 2019-02-19 ENCOUNTER — Other Ambulatory Visit: Payer: Self-pay

## 2019-02-19 VITALS — BP 133/86 | HR 73 | Temp 98.2°F | Resp 19 | Wt 189.0 lb

## 2019-02-19 DIAGNOSIS — Z923 Personal history of irradiation: Secondary | ICD-10-CM | POA: Insufficient documentation

## 2019-02-19 DIAGNOSIS — Z9484 Stem cells transplant status: Secondary | ICD-10-CM | POA: Diagnosis not present

## 2019-02-19 DIAGNOSIS — G20 Parkinson's disease: Secondary | ICD-10-CM | POA: Diagnosis not present

## 2019-02-19 DIAGNOSIS — Z5112 Encounter for antineoplastic immunotherapy: Secondary | ICD-10-CM | POA: Insufficient documentation

## 2019-02-19 DIAGNOSIS — G620 Drug-induced polyneuropathy: Secondary | ICD-10-CM

## 2019-02-19 DIAGNOSIS — C8195 Hodgkin lymphoma, unspecified, lymph nodes of inguinal region and lower limb: Secondary | ICD-10-CM | POA: Diagnosis not present

## 2019-02-19 DIAGNOSIS — Z79899 Other long term (current) drug therapy: Secondary | ICD-10-CM | POA: Insufficient documentation

## 2019-02-19 LAB — CBC WITH DIFFERENTIAL (CANCER CENTER ONLY)
Abs Immature Granulocytes: 0.02 10*3/uL (ref 0.00–0.07)
Basophils Absolute: 0.1 10*3/uL (ref 0.0–0.1)
Basophils Relative: 2 %
Eosinophils Absolute: 0.2 10*3/uL (ref 0.0–0.5)
Eosinophils Relative: 6 %
HCT: 44.3 % (ref 39.0–52.0)
Hemoglobin: 14.3 g/dL (ref 13.0–17.0)
Immature Granulocytes: 1 %
LYMPHS ABS: 0.7 10*3/uL (ref 0.7–4.0)
Lymphocytes Relative: 16 %
MCH: 31.6 pg (ref 26.0–34.0)
MCHC: 32.3 g/dL (ref 30.0–36.0)
MCV: 98 fL (ref 80.0–100.0)
Monocytes Absolute: 0.5 10*3/uL (ref 0.1–1.0)
Monocytes Relative: 13 %
Neutro Abs: 2.6 10*3/uL (ref 1.7–7.7)
Neutrophils Relative %: 62 %
Platelet Count: 211 10*3/uL (ref 150–400)
RBC: 4.52 MIL/uL (ref 4.22–5.81)
RDW: 12.6 % (ref 11.5–15.5)
WBC Count: 4.1 10*3/uL (ref 4.0–10.5)
nRBC: 0 % (ref 0.0–0.2)

## 2019-02-19 LAB — CMP (CANCER CENTER ONLY)
ALT: 10 U/L (ref 0–44)
AST: 19 U/L (ref 15–41)
Albumin: 4.4 g/dL (ref 3.5–5.0)
Alkaline Phosphatase: 71 U/L (ref 38–126)
Anion gap: 7 (ref 5–15)
BUN: 22 mg/dL — AB (ref 6–20)
CO2: 29 mmol/L (ref 22–32)
Calcium: 8.9 mg/dL (ref 8.9–10.3)
Chloride: 104 mmol/L (ref 98–111)
Creatinine: 1.07 mg/dL (ref 0.61–1.24)
GFR, Est AFR Am: >60 mL/min (ref >60)
GFR, Estimated: >60 mL/min (ref >60)
Glucose, Bld: 100 mg/dL — ABNORMAL HIGH (ref 70–99)
Potassium: 4.3 mmol/L (ref 3.5–5.1)
Sodium: 140 mmol/L (ref 135–145)
Total Bilirubin: 0.6 mg/dL (ref 0.3–1.2)
Total Protein: 6.4 g/dL — ABNORMAL LOW (ref 6.5–8.1)

## 2019-02-19 LAB — LACTATE DEHYDROGENASE: LDH: 213 U/L — ABNORMAL HIGH (ref 98–192)

## 2019-02-19 MED ORDER — SODIUM CHLORIDE 0.9 % IV SOLN
Freq: Once | INTRAVENOUS | Status: AC
  Administered 2019-02-19: 10:00:00 via INTRAVENOUS
  Filled 2019-02-19: qty 250

## 2019-02-19 MED ORDER — SODIUM CHLORIDE 0.9 % IV SOLN
480.0000 mg | Freq: Once | INTRAVENOUS | Status: AC
  Administered 2019-02-19: 480 mg via INTRAVENOUS
  Filled 2019-02-19: qty 48

## 2019-02-19 NOTE — Patient Instructions (Signed)
[facility] Discharge Instructions for Patients Receiving Chemotherapy  Today you received the following chemotherapy agents:  Nivolumab  To help prevent nausea and vomiting after your treatment, we encourage you to take your nausea medication as prescribed.   If you develop nausea and vomiting that is not controlled by your nausea medication, call the clinic.   BELOW ARE SYMPTOMS THAT SHOULD BE REPORTED IMMEDIATELY:  *FEVER GREATER THAN 100.5 F  *CHILLS WITH OR WITHOUT FEVER  NAUSEA AND VOMITING THAT IS NOT CONTROLLED WITH YOUR NAUSEA MEDICATION  *UNUSUAL SHORTNESS OF BREATH  *UNUSUAL BRUISING OR BLEEDING  TENDERNESS IN MOUTH AND THROAT WITH OR WITHOUT PRESENCE OF ULCERS  *URINARY PROBLEMS  *BOWEL PROBLEMS  UNUSUAL RASH Items with * indicate a potential emergency and should be followed up as soon as possible.  Feel free to call the clinic should you have any questions or concerns. The clinic phone number is [phone].  Please show the CHEMO ALERT CARD at check-in to the Emergency Department and triage nurse.   

## 2019-02-19 NOTE — Progress Notes (Signed)
Hematology and Oncology Follow Up Visit  TELLY JAWAD [id number] 1961-10-29 58 y.o. 02/19/2019   Principle Diagnosis:  Recurrent Hodgkin's Disease -  S/p CAR-T therapy  TIA-resolved  Current Therapy:  Nivolumab q month -- s/p cycle #5       Interim History:  Mr.  Janusz is in for follow-up.  So far, he is doing okay.  He still is working.  He was not laid off by his company because of the coronavirus.  He did join [employer].  He was really enjoyed this until it was close down by the state governor for the coronavirus.  He is walking.  He seems to be doing pretty well with the Parkinson's.  He has not heard from [employer] at all.  His last PET scan was done in February.  We will do another one probably in May.  He has had no cough.  He has had no change in bowel or bladder habits.  He still has some operative changes back when he had thoracic surgery a couple years ago.  Overall, his performance status is ECOG 1.    Medications:  Current Outpatient Medications:  .  acetaminophen (TYLENOL) 325 MG tablet, Take 650 mg by mouth daily as needed for mild pain. , Disp: , Rfl:  .  carbidopa-levodopa (SINEMET IR) 25-100 MG tablet, Take 1 tablet by mouth 3 (three) times daily. 1 extra QD PRN, Disp: 360 tablet, Rfl: 1 .  Cholecalciferol (VITAMIN D-3) 1000 UNITS CAPS, Take 1,000 Units by mouth 4 (four) times a week. , Disp: , Rfl:  .  pramipexole (MIRAPEX) 1 MG tablet, TAKE 1 TABLET(1 MG) BY MOUTH THREE TIMES DAILY, Disp: 270 tablet, Rfl: 1 .  sildenafil (REVATIO) 20 MG tablet, Take 1-5 pills daily as needed prior to intercourse, Disp: 30 tablet, Rfl: 1  Allergies: No Known Allergies  Past Medical History, Surgical history, Social history, and Family History were reviewed and updated.  Review of Systems: Review of Systems  Neurological: Positive for tremors.  All other systems reviewed and are negative.    Physical Exam:  weight is 189 lb (85.7 kg). His oral temperature is  98.2 F (36.8 C). His blood pressure is 133/86 and his pulse is 73. His respiration is 19 and oxygen saturation is 100%.   Physical Exam Vitals signs reviewed.  HENT:     Head: Normocephalic and atraumatic.  Eyes:     Pupils: Pupils are equal, round, and reactive to light.  Neck:     Musculoskeletal: Normal range of motion.  Cardiovascular:     Rate and Rhythm: Normal rate and regular rhythm.     Heart sounds: Normal heart sounds.  Pulmonary:     Effort: Pulmonary effort is normal.     Breath sounds: Normal breath sounds.  Abdominal:     General: Bowel sounds are normal.     Palpations: Abdomen is soft.  Musculoskeletal: Normal range of motion.        General: No tenderness or deformity.  Lymphadenopathy:     Cervical: No cervical adenopathy.  Skin:    General: Skin is warm and dry.     Findings: No erythema or rash.  Neurological:     Mental Status: He is alert and oriented to person, place, and time.  Psychiatric:        Behavior: Behavior normal.        Thought Content: Thought content normal.        Judgment: Judgment normal.  Lab Results  Component Value Date   WBC 4.1 02/19/2019   HGB 14.3 02/19/2019   HCT 44.3 02/19/2019   MCV 98.0 02/19/2019   PLT 211 02/19/2019     Chemistry      Component Value Date/Time   NA 141 01/22/2019 1054   NA 142 04/02/2017   NA 140 07/19/2016 1305   K 4.4 01/22/2019 1054   K 4.4 07/19/2016 1305   CL 106 01/22/2019 1054   CL 105 02/08/2016 1117   CL 107 03/25/2013 0828   CO2 28 01/22/2019 1054   CO2 26 07/19/2016 1305   BUN 27 (H) 01/22/2019 1054   BUN 21 04/02/2017   BUN 22.8 07/19/2016 1305   CREATININE 1.13 01/22/2019 1054   CREATININE 1.1 07/19/2016 1305   GLU 105 04/02/2017      Component Value Date/Time   CALCIUM 9.3 01/22/2019 1054   CALCIUM 9.8 07/19/2016 1305   ALKPHOS 74 01/22/2019 1054   ALKPHOS 76 07/19/2016 1305   AST 16 01/22/2019 1054   AST 18 07/19/2016 1305   ALT 7 01/22/2019 1054   ALT 14  07/19/2016 1305   BILITOT 0.5 01/22/2019 1054   BILITOT 0.70 07/19/2016 1305         Impression and Plan: Mr. Lukasik is a 58 year old gentleman.  He has history of recurrent Hodgkin's disease. 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 He then had a recurrence after this.  He did undergo CAR-T therapy at [facility].  He did well with this.  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  This was proven by biopsy.  He had radiation therapy for this.  He will get his nivolumab today.  I do not think we have to do another PET scan on him until May.  We will have him come back in 1 month.      [name], MD 3/26/20209:36 AM

## 2019-03-19 ENCOUNTER — Inpatient Hospital Stay: Payer: Commercial Managed Care - PPO | Attending: Hematology & Oncology | Admitting: Hematology & Oncology

## 2019-03-19 ENCOUNTER — Other Ambulatory Visit: Payer: Commercial Managed Care - PPO

## 2019-03-19 ENCOUNTER — Inpatient Hospital Stay: Payer: Commercial Managed Care - PPO

## 2019-03-19 ENCOUNTER — Encounter: Payer: Self-pay | Admitting: Hematology & Oncology

## 2019-03-19 ENCOUNTER — Other Ambulatory Visit: Payer: Self-pay

## 2019-03-19 VITALS — BP 122/82 | HR 85 | Temp 98.3°F | Resp 18 | Wt 186.0 lb

## 2019-03-19 DIAGNOSIS — Z9484 Stem cells transplant status: Secondary | ICD-10-CM | POA: Insufficient documentation

## 2019-03-19 DIAGNOSIS — C8195 Hodgkin lymphoma, unspecified, lymph nodes of inguinal region and lower limb: Secondary | ICD-10-CM | POA: Insufficient documentation

## 2019-03-19 DIAGNOSIS — G20 Parkinson's disease: Secondary | ICD-10-CM | POA: Insufficient documentation

## 2019-03-19 DIAGNOSIS — Z923 Personal history of irradiation: Secondary | ICD-10-CM | POA: Diagnosis not present

## 2019-03-19 DIAGNOSIS — Z5112 Encounter for antineoplastic immunotherapy: Secondary | ICD-10-CM | POA: Insufficient documentation

## 2019-03-19 DIAGNOSIS — Z79899 Other long term (current) drug therapy: Secondary | ICD-10-CM | POA: Diagnosis not present

## 2019-03-19 LAB — CMP (CANCER CENTER ONLY)
ALT: 4 U/L (ref 0–44)
AST: 18 U/L (ref 15–41)
Albumin: 4.6 g/dL (ref 3.5–5.0)
Alkaline Phosphatase: 69 U/L (ref 38–126)
Anion gap: 7 (ref 5–15)
BUN: 19 mg/dL (ref 6–20)
CO2: 29 mmol/L (ref 22–32)
Calcium: 10 mg/dL (ref 8.9–10.3)
Chloride: 104 mmol/L (ref 98–111)
Creatinine: 1.14 mg/dL (ref 0.61–1.24)
GFR, Est AFR Am: >60 mL/min (ref >60)
GFR, Estimated: >60 mL/min (ref >60)
Glucose, Bld: 97 mg/dL (ref 70–99)
Potassium: 4.4 mmol/L (ref 3.5–5.1)
Sodium: 140 mmol/L (ref 135–145)
Total Bilirubin: 0.9 mg/dL (ref 0.3–1.2)
Total Protein: 6.3 g/dL — ABNORMAL LOW (ref 6.5–8.1)

## 2019-03-19 LAB — CBC WITH DIFFERENTIAL (CANCER CENTER ONLY)
Abs Immature Granulocytes: 0.01 10*3/uL (ref 0.00–0.07)
Basophils Absolute: 0.1 10*3/uL (ref 0.0–0.1)
Basophils Relative: 1 %
Eosinophils Absolute: 0.2 10*3/uL (ref 0.0–0.5)
Eosinophils Relative: 4 %
HCT: 44.8 % (ref 39.0–52.0)
Hemoglobin: 14.5 g/dL (ref 13.0–17.0)
Immature Granulocytes: 0 %
Lymphocytes Relative: 15 %
Lymphs Abs: 0.7 10*3/uL (ref 0.7–4.0)
MCH: 31.5 pg (ref 26.0–34.0)
MCHC: 32.4 g/dL (ref 30.0–36.0)
MCV: 97.2 fL (ref 80.0–100.0)
Monocytes Absolute: 0.6 10*3/uL (ref 0.1–1.0)
Monocytes Relative: 13 %
Neutro Abs: 2.9 10*3/uL (ref 1.7–7.7)
Neutrophils Relative %: 67 %
Platelet Count: 194 10*3/uL (ref 150–400)
RBC: 4.61 MIL/uL (ref 4.22–5.81)
RDW: 12.8 % (ref 11.5–15.5)
WBC Count: 4.4 10*3/uL (ref 4.0–10.5)
nRBC: 0 % (ref 0.0–0.2)

## 2019-03-19 LAB — LACTATE DEHYDROGENASE: LDH: 228 U/L — ABNORMAL HIGH (ref 98–192)

## 2019-03-19 MED ORDER — SODIUM CHLORIDE 0.9 % IV SOLN
Freq: Once | INTRAVENOUS | Status: AC
  Administered 2019-03-19: 12:00:00 via INTRAVENOUS
  Filled 2019-03-19: qty 250

## 2019-03-19 MED ORDER — SODIUM CHLORIDE 0.9 % IV SOLN
480.0000 mg | Freq: Once | INTRAVENOUS | Status: AC
  Administered 2019-03-19: 480 mg via INTRAVENOUS
  Filled 2019-03-19: qty 48

## 2019-03-19 NOTE — Patient Instructions (Signed)
Nivolumab injection What is this medicine? NIVOLUMAB (nye VOL ue mab) is a monoclonal antibody. It is used to treat melanoma, lung cancer, kidney cancer, head and neck cancer, Hodgkin lymphoma, urothelial cancer, colon cancer, and liver cancer. This medicine may be used for other purposes; ask your health care provider or pharmacist if you have questions. COMMON BRAND NAME(S): Opdivo What should I tell my health care provider before I take this medicine? They need to know if you have any of these conditions: -diabetes -immune system problems -kidney disease -liver disease -lung disease -organ transplant -stomach or intestine problems -thyroid disease -an unusual or allergic reaction to nivolumab, other medicines, foods, dyes, or preservatives -pregnant or trying to get pregnant -breast-feeding How should I use this medicine? This medicine is for infusion into a vein. It is given by a health care professional in a hospital or clinic setting. A special MedGuide will be given to you before each treatment. Be sure to read this information carefully each time. Talk to your pediatrician regarding the use of this medicine in children. While this drug may be prescribed for children as young as 12 years for selected conditions, precautions do apply. Overdosage: If you think you have taken too much of this medicine contact a poison control center or emergency room at once. NOTE: This medicine is only for you. Do not share this medicine with others. What if I miss a dose? It is important not to miss your dose. Call your doctor or health care professional if you are unable to keep an appointment. What may interact with this medicine? Interactions have not been studied. Give your health care provider a list of all the medicines, herbs, non-prescription drugs, or dietary supplements you use. Also tell them if you smoke, drink alcohol, or use illegal drugs. Some items may interact with your  medicine. This list may not describe all possible interactions. Give your health care provider a list of all the medicines, herbs, non-prescription drugs, or dietary supplements you use. Also tell them if you smoke, drink alcohol, or use illegal drugs. Some items may interact with your medicine. What should I watch for while using this medicine? This drug may make you feel generally unwell. Continue your course of treatment even though you feel ill unless your doctor tells you to stop. You may need blood work done while you are taking this medicine. Do not become pregnant while taking this medicine or for 5 months after stopping it. Women should inform their doctor if they wish to become pregnant or think they might be pregnant. There is a potential for serious side effects to an unborn child. Talk to your health care professional or pharmacist for more information. Do not breast-feed an infant while taking this medicine or for 5 months after stopping it. What side effects may I notice from receiving this medicine? Side effects that you should report to your doctor or health care professional as soon as possible: -allergic reactions like skin rash, itching or hives, swelling of the face, lips, or tongue -breathing problems -blood in the urine -bloody or watery diarrhea or black, tarry stools -changes in emotions or moods -changes in vision -chest pain -cough -dizziness -feeling faint or lightheaded, falls -fever, chills -headache with fever, neck stiffness, confusion, loss of memory, sensitivity to light, hallucination, loss of contact with reality, or seizures -joint pain -mouth sores -redness, blistering, peeling or loosening of the skin, including inside the mouth -severe muscle pain or weakness -signs and symptoms of   high blood sugar such as dizziness; dry mouth; dry skin; fruity breath; nausea; stomach pain; increased hunger or thirst; increased urination -signs and symptoms of kidney  injury like trouble passing urine or change in the amount of urine -signs and symptoms of liver injury like dark yellow or brown urine; general ill feeling or flu-like symptoms; light-colored stools; loss of appetite; nausea; right upper belly pain; unusually weak or tired; yellowing of the eyes or skin -swelling of the ankles, feet, hands -trouble passing urine or change in the amount of urine -unusually weak or tired -weight gain or loss Side effects that usually do not require medical attention (report to your doctor or health care professional if they continue or are bothersome): -bone pain -constipation -decreased appetite -diarrhea -muscle pain -nausea, vomiting -tiredness This list may not describe all possible side effects. Call your doctor for medical advice about side effects. You may report side effects to FDA at 1-800-FDA-1088. Where should I keep my medicine? This drug is given in a hospital or clinic and will not be stored at home. NOTE: This sheet is a summary. It may not cover all possible information. If you have questions about this medicine, talk to your doctor, pharmacist, or health care provider.  2019 [employer]vier/Gold Standard (2018-04-02 12:55:04)  

## 2019-03-19 NOTE — Progress Notes (Signed)
Hematology and Oncology Follow Up Visit  Brian [name] [id number] 03/01/1961 58 y.o. 03/19/2019   Principle Diagnosis:  Recurrent Hodgkin's Disease -  S/p CAR-T therapy  TIA-resolved  Current Therapy:  Nivolumab q month -- s/p cycle #6       Interim History:  [name] is in for follow-up.  So far, he is doing pretty well.  He is having no problems with the coronavirus.  He is working.  Thankfully, he has not been laid off.  He is walking.  He is doing some exercising at home.  He is eating without difficulty.  He has had no problems with night sweats or fevers.  He is not noted any swollen lymph nodes.  He has had no nausea or vomiting.  There is been no change in bowel or bladder habits.  He has had no rashes.  He still has a neuropathy in his feet.  He is managing this pretty well.  Overall, his performance status is ECOG 1.    Medications:  Current Outpatient Medications:  .  acetaminophen (TYLENOL) 325 MG tablet, Take 650 mg by mouth daily as needed for mild pain. , Disp: , Rfl:  .  carbidopa-levodopa (SINEMET IR) 25-100 MG tablet, Take 1 tablet by mouth 3 (three) times daily. 1 extra QD PRN, Disp: 360 tablet, Rfl: 1 .  Cholecalciferol (VITAMIN D-3) 1000 UNITS CAPS, Take 1,000 Units by mouth 4 (four) times a week. , Disp: , Rfl:  .  pramipexole (MIRAPEX) 1 MG tablet, TAKE 1 TABLET(1 MG) BY MOUTH THREE TIMES DAILY, Disp: 270 tablet, Rfl: 1 .  sildenafil (REVATIO) 20 MG tablet, Take 1-5 pills daily as needed prior to intercourse, Disp: 30 tablet, Rfl: 1  Allergies: No Known Allergies  Past Medical History, Surgical history, Social history, and Family History were reviewed and updated.  Review of Systems: Review of Systems  Neurological: Positive for tremors.  All other systems reviewed and are negative.    Physical Exam:  weight is 186 lb (84.4 kg). His oral temperature is 98.3 F (36.8 C). His blood pressure is 122/82 and his pulse is 85. His respiration is 18  and oxygen saturation is 96%.   Physical Exam Vitals signs reviewed.  HENT:     Head: Normocephalic and atraumatic.  Eyes:     Pupils: Pupils are equal, round, and reactive to light.  Neck:     Musculoskeletal: Normal range of motion.  Cardiovascular:     Rate and Rhythm: Normal rate and regular rhythm.     Heart sounds: Normal heart sounds.  Pulmonary:     Effort: Pulmonary effort is normal.     Breath sounds: Normal breath sounds.  Abdominal:     General: Bowel sounds are normal.     Palpations: Abdomen is soft.  Musculoskeletal: Normal range of motion.        General: No tenderness or deformity.  Lymphadenopathy:     Cervical: No cervical adenopathy.  Skin:    General: Skin is warm and dry.     Findings: No erythema or rash.  Neurological:     Mental Status: He is alert and oriented to person, place, and time.  Psychiatric:        Behavior: Behavior normal.        Thought Content: Thought content normal.        Judgment: Judgment normal.     Lab Results  Component Value Date   WBC 4.4 03/19/2019   HGB 14.5  03/19/2019   HCT 44.8 03/19/2019   MCV 97.2 03/19/2019   PLT 194 03/19/2019     Chemistry      Component Value Date/Time   NA 140 03/19/2019 0952   NA 142 04/02/2017   NA 140 07/19/2016 1305   K 4.4 03/19/2019 0952   K 4.4 07/19/2016 1305   CL 104 03/19/2019 0952   CL 105 02/08/2016 1117   CL 107 03/25/2013 0828   CO2 29 03/19/2019 0952   CO2 26 07/19/2016 1305   BUN 19 03/19/2019 0952   BUN 21 04/02/2017   BUN 22.8 07/19/2016 1305   CREATININE 1.14 03/19/2019 0952   CREATININE 1.1 07/19/2016 1305   GLU 105 04/02/2017      Component Value Date/Time   CALCIUM 10.0 03/19/2019 0952   CALCIUM 9.8 07/19/2016 1305   ALKPHOS 69 03/19/2019 0952   ALKPHOS 76 07/19/2016 1305   AST 18 03/19/2019 0952   AST 18 07/19/2016 1305   ALT 4 03/19/2019 0952   ALT 14 07/19/2016 1305   BILITOT 0.9 03/19/2019 0952   BILITOT 0.70 07/19/2016 1305          Impression and Plan: Brian [name] is a 58 year old gentleman.  He has history of recurrent Hodgkin's disease. 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 He then had a recurrence after this.  He did undergo CAR-T therapy at [facility].  He did well with this.  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  This was proven by biopsy.  He had radiation therapy for this.  I told him that as long as the nivolumab is working, there is no need to make any changes with it.  He probably will not be due for another PET scan until May or June.  We will have him come back in another month for nivolumab.       [name], MD 4/23/202011:05 AM

## 2019-04-02 ENCOUNTER — Encounter: Payer: Self-pay | Admitting: Hematology & Oncology

## 2019-04-16 ENCOUNTER — Inpatient Hospital Stay: Payer: Commercial Managed Care - PPO

## 2019-04-16 ENCOUNTER — Inpatient Hospital Stay: Payer: Commercial Managed Care - PPO | Attending: Hematology & Oncology | Admitting: Family

## 2019-04-16 ENCOUNTER — Other Ambulatory Visit: Payer: Self-pay

## 2019-04-16 ENCOUNTER — Telehealth: Payer: Self-pay | Admitting: Family

## 2019-04-16 VITALS — Temp 98.3°F

## 2019-04-16 VITALS — BP 118/82 | HR 89 | Resp 18 | Ht 70.0 in | Wt 190.1 lb

## 2019-04-16 DIAGNOSIS — Z79899 Other long term (current) drug therapy: Secondary | ICD-10-CM | POA: Diagnosis not present

## 2019-04-16 DIAGNOSIS — Z9484 Stem cells transplant status: Secondary | ICD-10-CM | POA: Diagnosis not present

## 2019-04-16 DIAGNOSIS — Z923 Personal history of irradiation: Secondary | ICD-10-CM | POA: Diagnosis not present

## 2019-04-16 DIAGNOSIS — G20 Parkinson's disease: Secondary | ICD-10-CM | POA: Insufficient documentation

## 2019-04-16 DIAGNOSIS — C8195 Hodgkin lymphoma, unspecified, lymph nodes of inguinal region and lower limb: Secondary | ICD-10-CM | POA: Diagnosis not present

## 2019-04-16 DIAGNOSIS — C8190 Hodgkin lymphoma, unspecified, unspecified site: Secondary | ICD-10-CM

## 2019-04-16 DIAGNOSIS — Z5112 Encounter for antineoplastic immunotherapy: Secondary | ICD-10-CM | POA: Insufficient documentation

## 2019-04-16 DIAGNOSIS — E032 Hypothyroidism due to medicaments and other exogenous substances: Secondary | ICD-10-CM

## 2019-04-16 LAB — CMP (CANCER CENTER ONLY)
ALT: 5 U/L (ref 0–44)
AST: 17 U/L (ref 15–41)
Albumin: 4.5 g/dL (ref 3.5–5.0)
Alkaline Phosphatase: 74 U/L (ref 38–126)
Anion gap: 7 (ref 5–15)
BUN: 22 mg/dL — ABNORMAL HIGH (ref 6–20)
CO2: 28 mmol/L (ref 22–32)
Calcium: 9 mg/dL (ref 8.9–10.3)
Chloride: 104 mmol/L (ref 98–111)
Creatinine: 1.12 mg/dL (ref 0.61–1.24)
GFR, Est AFR Am: >60 mL/min (ref >60)
GFR, Estimated: >60 mL/min (ref >60)
Glucose, Bld: 105 mg/dL — ABNORMAL HIGH (ref 70–99)
Potassium: 4.3 mmol/L (ref 3.5–5.1)
Sodium: 139 mmol/L (ref 135–145)
Total Bilirubin: 0.5 mg/dL (ref 0.3–1.2)
Total Protein: 6.6 g/dL (ref 6.5–8.1)

## 2019-04-16 LAB — CBC WITH DIFFERENTIAL (CANCER CENTER ONLY)
Abs Immature Granulocytes: 0.02 10*3/uL (ref 0.00–0.07)
Basophils Absolute: 0.1 10*3/uL (ref 0.0–0.1)
Basophils Relative: 1 %
Eosinophils Absolute: 0.3 10*3/uL (ref 0.0–0.5)
Eosinophils Relative: 8 %
HCT: 43.7 % (ref 39.0–52.0)
Hemoglobin: 14.4 g/dL (ref 13.0–17.0)
Immature Granulocytes: 1 %
Lymphocytes Relative: 17 %
Lymphs Abs: 0.6 10*3/uL — ABNORMAL LOW (ref 0.7–4.0)
MCH: 32.1 pg (ref 26.0–34.0)
MCHC: 33 g/dL (ref 30.0–36.0)
MCV: 97.5 fL (ref 80.0–100.0)
Monocytes Absolute: 0.5 10*3/uL (ref 0.1–1.0)
Monocytes Relative: 13 %
Neutro Abs: 2.2 10*3/uL (ref 1.7–7.7)
Neutrophils Relative %: 60 %
Platelet Count: 207 10*3/uL (ref 150–400)
RBC: 4.48 MIL/uL (ref 4.22–5.81)
RDW: 12.9 % (ref 11.5–15.5)
WBC Count: 3.6 10*3/uL — ABNORMAL LOW (ref 4.0–10.5)
nRBC: 0 % (ref 0.0–0.2)

## 2019-04-16 LAB — LACTATE DEHYDROGENASE: LDH: 203 U/L — ABNORMAL HIGH (ref 98–192)

## 2019-04-16 LAB — TSH: TSH: 3.566 u[IU]/mL (ref 0.320–4.118)

## 2019-04-16 MED ORDER — SODIUM CHLORIDE 0.9 % IV SOLN
Freq: Once | INTRAVENOUS | Status: AC
  Administered 2019-04-16: 11:00:00 via INTRAVENOUS
  Filled 2019-04-16: qty 250

## 2019-04-16 MED ORDER — SODIUM CHLORIDE 0.9 % IV SOLN
480.0000 mg | Freq: Once | INTRAVENOUS | Status: AC
  Administered 2019-04-16: 12:00:00 480 mg via INTRAVENOUS
  Filled 2019-04-16: qty 48

## 2019-04-16 NOTE — Patient Instructions (Signed)
[facility] Discharge Instructions for Patients Receiving Chemotherapy  Today you received the following chemotherapy agents:  Nivolumab  To help prevent nausea and vomiting after your treatment, we encourage you to take your nausea medication as ordered per MD.    If you develop nausea and vomiting that is not controlled by your nausea medication, call the clinic.   BELOW ARE SYMPTOMS THAT SHOULD BE REPORTED IMMEDIATELY:  *FEVER GREATER THAN 100.5 F  *CHILLS WITH OR WITHOUT FEVER  NAUSEA AND VOMITING THAT IS NOT CONTROLLED WITH YOUR NAUSEA MEDICATION  *UNUSUAL SHORTNESS OF BREATH  *UNUSUAL BRUISING OR BLEEDING  TENDERNESS IN MOUTH AND THROAT WITH OR WITHOUT PRESENCE OF ULCERS  *URINARY PROBLEMS  *BOWEL PROBLEMS  UNUSUAL RASH Items with * indicate a potential emergency and should be followed up as soon as possible.  Feel free to call the clinic should you have any questions or concerns. The clinic phone number is [phone].  Please show the CHEMO ALERT CARD at check-in to the Emergency Department and triage nurse.   

## 2019-04-16 NOTE — Progress Notes (Signed)
Hematology and Oncology Follow Up [name]  Brian [name] [id number] January 16, 1961 58 y.o. 04/16/2019   Principle Diagnosis:  Recurrent Hodgkin's Disease -  S/p CAR-T therapy   TIA-resolved  Current Therapy:   Nivolumab q month -- s/p cycle 7     Interim History:  Brian [name] is here today for follow-up and treatment. He is tolerating treatment with Opdivo nicely so far and has no experienced and adverse effects.  He is scheduled for his follow-up PET scan next week on 5/27. After this scan he would like to try and go back to follow-up scans every 6 months.  He is doing well on his current medication regimen for Parkinson's and recently states that he got a good report from Dr. Alfonso Patten. Tat.  He has started walking again and is hoping to start jogging soon. He has to be careful due to both the Parkinson's and neuropathy in his feet. These have both effected his strength/balance and he will sometimes stumble and has a shuffling gait. Thankfully he has had no recent falls.  No swelling in his extremities at this time.  He is also starting to curb his diet to eat healthier and smaller portions.  No fever, chills, n/v, cough, rash, SOB, chest pain, palpitations, abdominal pain or changes in bowel or bladder habits.  He has occasional bouts with constipation and will take a stool softener when needed which is effective. With constipation and straining, he will sometimes note a little bright red blood due to anal fissures.  No other episodes of bleeding. No bruising or petechiae.   ECOG Performance Status: 1 - Symptomatic but completely ambulatory  Medications:  Allergies as of 04/16/2019   No Known Allergies     Medication List       Accurate as of Apr 16, 2019  9:47 AM. If you have any questions, ask your nurse or doctor.        acetaminophen 325 MG tablet Commonly known as:  TYLENOL Take 650 mg by mouth daily as needed for mild pain.   carbidopa-levodopa 25-100 MG tablet Commonly known as:   SINEMET IR Take 1 tablet by mouth 3 (three) times daily. 1 extra QD PRN   pramipexole 1 MG tablet Commonly known as:  MIRAPEX TAKE 1 TABLET(1 MG) BY MOUTH THREE TIMES DAILY   sildenafil 20 MG tablet Commonly known as:  REVATIO Take 1-5 pills daily as needed prior to intercourse   Vitamin D-3 25 MCG (1000 UT) Caps Take 1,000 Units by mouth 4 (four) times a week.       Allergies: No Known Allergies  Past Medical History, Surgical history, Social history, and Family History were reviewed and updated.  Review of Systems: All other 10 point review of systems is negative.   Physical Exam:  vitals were not taken for this [name].   Wt Readings from Last 3 Encounters:  03/19/19 186 lb (84.4 kg)  02/19/19 189 lb (85.7 kg)  01/22/19 189 lb 8 oz (86 kg)    Ocular: Sclerae unicteric, pupils equal, round and reactive to light Ear-nose-throat: Oropharynx clear, dentition fair Lymphatic: No cervical or supraclavicular adenopathy Lungs no rales or rhonchi, good excursion bilaterally Heart regular rate and rhythm, no murmur appreciated Abd soft, nontender, positive bowel sounds, no liver or spleen tip palpated on exam, no fluid wave  MSK no focal spinal tenderness, no joint edema Neuro: non-focal, well-oriented, appropriate affect Breasts: Deferred   Lab Results  Component Value Date   WBC 4.4 03/19/2019  HGB 14.5 03/19/2019   HCT 44.8 03/19/2019   MCV 97.2 03/19/2019   PLT 194 03/19/2019   Lab Results  Component Value Date   FERRITIN 29 10/15/2012   IRON 71 10/15/2012   TIBC 370 10/15/2012   UIBC 299 10/15/2012   IRONPCTSAT 19 (L) 10/15/2012   Lab Results  Component Value Date   RETICCTPCT 1.3 10/15/2012   RBC 4.61 03/19/2019   RETICCTABS 63.8 10/15/2012   No results found for: KPAFRELGTCHN, LAMBDASER, KAPLAMBRATIO No results found for: IGGSERUM, IGA, IGMSERUM No results found for: Odetta Pink, SPEI   Chemistry       Component Value Date/Time   NA 140 03/19/2019 0952   NA 142 04/02/2017   NA 140 07/19/2016 1305   K 4.4 03/19/2019 0952   K 4.4 07/19/2016 1305   CL 104 03/19/2019 0952   CL 105 02/08/2016 1117   CL 107 03/25/2013 0828   CO2 29 03/19/2019 0952   CO2 26 07/19/2016 1305   BUN 19 03/19/2019 0952   BUN 21 04/02/2017   BUN 22.8 07/19/2016 1305   CREATININE 1.14 03/19/2019 0952   CREATININE 1.1 07/19/2016 1305   GLU 105 04/02/2017      Component Value Date/Time   CALCIUM 10.0 03/19/2019 0952   CALCIUM 9.8 07/19/2016 1305   ALKPHOS 69 03/19/2019 0952   ALKPHOS 76 07/19/2016 1305   AST 18 03/19/2019 0952   AST 18 07/19/2016 1305   ALT 4 03/19/2019 0952   ALT 14 07/19/2016 1305   BILITOT 0.9 03/19/2019 0952   BILITOT 0.70 07/19/2016 1305       Impression and Plan: Brian [name] is a 58 yo caucasian gentleman with recurrent Hodgkin's disease. He has history of autologously stem cell transplant in May 2014 and CAR-T at [facility].  He was treated for a relapse in his spine with radiation therapy last year.  He is now doing well on Nivolumab and will continue his same regimen.  He will be having his repeat PET scan next week on 5/27.  We will plan to see him back in another month.  He will contact our office with any questions or concerns. We can certainly see him sooner if need be.   Laverna Peace, NP 5/21/20209:47 AM

## 2019-04-16 NOTE — Telephone Encounter (Signed)
Appointments scheduled calendar printed per 5/21 los 

## 2019-04-22 ENCOUNTER — Encounter: Payer: Self-pay | Admitting: *Deleted

## 2019-04-22 ENCOUNTER — Other Ambulatory Visit: Payer: Self-pay | Admitting: Neurology

## 2019-04-22 ENCOUNTER — Other Ambulatory Visit: Payer: Self-pay

## 2019-04-22 ENCOUNTER — Ambulatory Visit (HOSPITAL_COMMUNITY)
Admission: RE | Admit: 2019-04-22 | Discharge: 2019-04-22 | Disposition: A | Discharge status: Home / Self Care | Payer: Commercial Managed Care - PPO | Source: Ambulatory Visit | Attending: Hematology & Oncology | Admitting: Hematology & Oncology

## 2019-04-22 ENCOUNTER — Encounter: Payer: Self-pay | Admitting: Hematology & Oncology

## 2019-04-22 DIAGNOSIS — C8195 Hodgkin lymphoma, unspecified, lymph nodes of inguinal region and lower limb: Secondary | ICD-10-CM | POA: Insufficient documentation

## 2019-04-22 LAB — GLUCOSE, CAPILLARY: Glucose-Capillary: 98 mg/dL (ref 70–99)

## 2019-04-22 MED ORDER — FLUDEOXYGLUCOSE F - 18 (FDG) INJECTION
9.5000 | Freq: Once | INTRAVENOUS | Status: AC
  Administered 2019-04-22: 11:00:00 9.5 via INTRAVENOUS

## 2019-04-22 NOTE — Telephone Encounter (Signed)
Requested Prescriptions   Pending Prescriptions Disp Refills  . carbidopa-levodopa (SINEMET IR) 25-100 MG tablet [Pharmacy Med Name: CARBIDOPA/LEVODOPA 25-100MG  TABS] 360 tablet 1    Sig: TAKE 1 TABLET BY MOUTH THREE TIMES DAILY.TAKE 1 EXTRA EVERY DAY IF NEEDED   Rx last filled: 09/01/18 #360 1 refill  Pt last seen: 2/11/20ASSESSMENT/PLAN:  1.   Parkinson's disease.             -The patient had a DaT scan on 08/16/2016 that showed abnormal grade 3 uptake, virtually absent bilaterally in both putamen and caudate nuclei.              -We discussed the diagnosis as well as pathophysiology of the disease.  We discussed treatment options as well as prognostic indicators.  Patient education was provided.             -Patient does seem to have some hypersexual behavior from pramipexole, but does not want to change the medication.  Hypersexual behavior is not occurring outside of appropriate relationships.  He is aware that that behavior could be from pramipexole.  He is monitoring closely and is very open with me about this.  For now, he will continue pramipexole, 1 mg 3 times per day.             -continue carbidopa/levodopa 25/100 tid.  I did tell him he could take 1 extra of these throughout the day if he needed it (bad day).  Risks, benefits, side effects and alternative therapies were discussed, including but limited to dyskinesia.  The opportunity to ask questions was given and they were answered to the best of my ability.  The patient expressed understanding and willingness to follow the outlined treatment protocols.  Follow up appt scheduled: 07/07/19

## 2019-04-23 ENCOUNTER — Telehealth: Payer: Self-pay | Admitting: Family Medicine

## 2019-04-23 DIAGNOSIS — N402 Nodular prostate without lower urinary tract symptoms: Secondary | ICD-10-CM

## 2019-04-23 NOTE — Telephone Encounter (Signed)
Called pt back I looked at PET scan-  IMPRESSION: 1. No specific findings identified to suggest residual or recurrent lymphoma. 2. Small subcutaneous soft tissue nodule within the left buttock region exhibits new, mild FDG uptake with SUV max of 2.5, Deauville criteria 3. 3. Focal area of increased uptake within the prostate gland is noted, SUV max 6.2. Nonspecific. Findings may represent an area of focal prostatitis. Correlation with the prostate cancer screening Advised.  He does need referral to urology- most recently saw Dr Diona Fanti in 2015 for some sort of benign cyst removal  We had noted increased of his PSA in 2019, I had asked him to repeat in 3 months but this never ended up happening  Lab Results  Component Value Date   PSA 2.89 01/16/2018   PSA 0.98 08/24/2013   PSA 1.97 08/12/2012    At this time will refer him back to urology ASAP for further evaluation.  Placed referral in the computer, Joanna can call them in the morning and schedule an appointment

## 2019-04-23 NOTE — Telephone Encounter (Signed)
Pt is asking for a call back, he forgot to tell Dr Lorelei Pont something

## 2019-04-23 NOTE — Telephone Encounter (Signed)
Copied from [place] [id number]. Topic: Quick Communication - See Telephone Encounter >> Apr 23, 2019  9:21 AM Rayann Heman wrote: CRM for notification. See Telephone encounter for: 04/23/19. Pt called and stated that he would like Dr Lorelei Pont to look over pet scan done 04/22/19 and give any suggestions. Pt states that he may need a referral to a urologist. Please advise

## 2019-04-23 NOTE — Telephone Encounter (Signed)
Please advise 

## 2019-04-24 LAB — PSA: PSA: 2.95

## 2019-04-24 NOTE — Telephone Encounter (Signed)
Hi- can one of you please take care of this for him?  He has a PET scan from earlier this week, and most recent PSA I think from 2019.  Thank you!

## 2019-04-24 NOTE — Telephone Encounter (Signed)
Patient calling in stating he has an appointment today at [facility] and he would like his results from his PET Scan and last PSA results sent over to them before 2pm today.

## 2019-04-28 NOTE — Telephone Encounter (Signed)
Results faxed.

## 2019-05-04 ENCOUNTER — Encounter: Payer: Self-pay | Admitting: Family Medicine

## 2019-05-11 ENCOUNTER — Telehealth: Payer: Self-pay

## 2019-05-11 NOTE — Telephone Encounter (Signed)
Resolved. Paperwork faxed.

## 2019-05-21 ENCOUNTER — Other Ambulatory Visit: Payer: Self-pay

## 2019-05-21 ENCOUNTER — Inpatient Hospital Stay: Payer: Commercial Managed Care - PPO

## 2019-05-21 ENCOUNTER — Inpatient Hospital Stay: Payer: Commercial Managed Care - PPO | Attending: Hematology & Oncology

## 2019-05-21 ENCOUNTER — Inpatient Hospital Stay (HOSPITAL_BASED_OUTPATIENT_CLINIC_OR_DEPARTMENT_OTHER): Payer: Commercial Managed Care - PPO | Admitting: Hematology & Oncology

## 2019-05-21 VITALS — BP 136/83 | HR 68 | Temp 98.1°F | Resp 16 | Wt 188.0 lb

## 2019-05-21 DIAGNOSIS — Z79899 Other long term (current) drug therapy: Secondary | ICD-10-CM | POA: Insufficient documentation

## 2019-05-21 DIAGNOSIS — Z923 Personal history of irradiation: Secondary | ICD-10-CM | POA: Insufficient documentation

## 2019-05-21 DIAGNOSIS — Z5112 Encounter for antineoplastic immunotherapy: Secondary | ICD-10-CM | POA: Insufficient documentation

## 2019-05-21 DIAGNOSIS — C8195 Hodgkin lymphoma, unspecified, lymph nodes of inguinal region and lower limb: Secondary | ICD-10-CM

## 2019-05-21 DIAGNOSIS — G20 Parkinson's disease: Secondary | ICD-10-CM

## 2019-05-21 DIAGNOSIS — Z9484 Stem cells transplant status: Secondary | ICD-10-CM | POA: Insufficient documentation

## 2019-05-21 DIAGNOSIS — E032 Hypothyroidism due to medicaments and other exogenous substances: Secondary | ICD-10-CM

## 2019-05-21 DIAGNOSIS — C8190 Hodgkin lymphoma, unspecified, unspecified site: Secondary | ICD-10-CM

## 2019-05-21 LAB — CMP (CANCER CENTER ONLY)
ALT: 9 U/L (ref 0–44)
AST: 18 U/L (ref 15–41)
Albumin: 4.4 g/dL (ref 3.5–5.0)
Alkaline Phosphatase: 66 U/L (ref 38–126)
Anion gap: 7 (ref 5–15)
BUN: 22 mg/dL — ABNORMAL HIGH (ref 6–20)
CO2: 29 mmol/L (ref 22–32)
Calcium: 9.5 mg/dL (ref 8.9–10.3)
Chloride: 104 mmol/L (ref 98–111)
Creatinine: 1.07 mg/dL (ref 0.61–1.24)
GFR, Est AFR Am: >60 mL/min (ref >60)
GFR, Estimated: >60 mL/min (ref >60)
Glucose, Bld: 95 mg/dL (ref 70–99)
Potassium: 4.3 mmol/L (ref 3.5–5.1)
Sodium: 140 mmol/L (ref 135–145)
Total Bilirubin: 0.6 mg/dL (ref 0.3–1.2)
Total Protein: 6.4 g/dL — ABNORMAL LOW (ref 6.5–8.1)

## 2019-05-21 LAB — CBC WITH DIFFERENTIAL (CANCER CENTER ONLY)
Abs Immature Granulocytes: 0.03 10*3/uL (ref 0.00–0.07)
Basophils Absolute: 0.1 10*3/uL (ref 0.0–0.1)
Basophils Relative: 1 %
Eosinophils Absolute: 0.2 10*3/uL (ref 0.0–0.5)
Eosinophils Relative: 3 %
HCT: 43 % (ref 39.0–52.0)
Hemoglobin: 14 g/dL (ref 13.0–17.0)
Immature Granulocytes: 1 %
Lymphocytes Relative: 11 %
Lymphs Abs: 0.6 10*3/uL — ABNORMAL LOW (ref 0.7–4.0)
MCH: 31.9 pg (ref 26.0–34.0)
MCHC: 32.6 g/dL (ref 30.0–36.0)
MCV: 97.9 fL (ref 80.0–100.0)
Monocytes Absolute: 0.6 10*3/uL (ref 0.1–1.0)
Monocytes Relative: 10 %
Neutro Abs: 4.3 10*3/uL (ref 1.7–7.7)
Neutrophils Relative %: 74 %
Platelet Count: 205 10*3/uL (ref 150–400)
RBC: 4.39 MIL/uL (ref 4.22–5.81)
RDW: 12.8 % (ref 11.5–15.5)
WBC Count: 5.7 10*3/uL (ref 4.0–10.5)
nRBC: 0 % (ref 0.0–0.2)

## 2019-05-21 LAB — LACTATE DEHYDROGENASE: LDH: 203 U/L — ABNORMAL HIGH (ref 98–192)

## 2019-05-21 MED ORDER — SODIUM CHLORIDE 0.9 % IV SOLN
480.0000 mg | Freq: Once | INTRAVENOUS | Status: AC
  Administered 2019-05-21: 480 mg via INTRAVENOUS
  Filled 2019-05-21: qty 48

## 2019-05-21 MED ORDER — SODIUM CHLORIDE 0.9 % IV SOLN
Freq: Once | INTRAVENOUS | Status: AC
  Administered 2019-05-21: 15:00:00 via INTRAVENOUS
  Filled 2019-05-21: qty 250

## 2019-05-21 NOTE — Patient Instructions (Addendum)
[facility] Discharge Instructions for Patients Receiving Chemotherapy  Today you received the following chemotherapy agents Nivolumab To help prevent nausea and vomiting after your treatment, we encourage you to take your nausea medication as prescribed.   If you develop nausea and vomiting that is not controlled by your nausea medication, call the clinic.   BELOW ARE SYMPTOMS THAT SHOULD BE REPORTED IMMEDIATELY:  *FEVER GREATER THAN 100.5 F  *CHILLS WITH OR WITHOUT FEVER  NAUSEA AND VOMITING THAT IS NOT CONTROLLED WITH YOUR NAUSEA MEDICATION  *UNUSUAL SHORTNESS OF BREATH  *UNUSUAL BRUISING OR BLEEDING  TENDERNESS IN MOUTH AND THROAT WITH OR WITHOUT PRESENCE OF ULCERS  *URINARY PROBLEMS  *BOWEL PROBLEMS  UNUSUAL RASH Items with * indicate a potential emergency and should be followed up as soon as possible.  Feel free to call the clinic should you have any questions or concerns. The clinic phone number is [phone].  Please show the [facility] at check-in to the Emergency Department and triage nurse.  Nivolumab injection What is this medicine? NIVOLUMAB (nye VOL ue mab) is a monoclonal antibody. It is used to treat melanoma, lung cancer, kidney cancer, head and neck cancer, Hodgkin lymphoma, urothelial cancer, colon cancer, and liver cancer. This medicine may be used for other purposes; ask your health care provider or pharmacist if you have questions. COMMON BRAND NAME(S): Opdivo What should I tell my health care provider before I take this medicine? They need to know if you have any of these conditions: -diabetes -immune system problems -kidney disease -liver disease -lung disease -organ transplant -stomach or intestine problems -thyroid disease -an unusual or allergic reaction to nivolumab, other medicines, foods, dyes, or preservatives -pregnant or trying to get pregnant -breast-feeding How should I use this medicine? This medicine is  for infusion into a vein. It is given by a health care professional in a hospital or clinic setting. A special MedGuide will be given to you before each treatment. Be sure to read this information carefully each time. Talk to your pediatrician regarding the use of this medicine in children. While this drug may be prescribed for children as young as 12 years for selected conditions, precautions do apply. Overdosage: If you think you have taken too much of this medicine contact a poison control center or emergency room at once. NOTE: This medicine is only for you. Do not share this medicine with others. What if I miss a dose? It is important not to miss your dose. Call your doctor or health care professional if you are unable to keep an appointment. What may interact with this medicine? Interactions have not been studied. Give your health care provider a list of all the medicines, herbs, non-prescription drugs, or dietary supplements you use. Also tell them if you smoke, drink alcohol, or use illegal drugs. Some items may interact with your medicine. This list may not describe all possible interactions. Give your health care provider a list of all the medicines, herbs, non-prescription drugs, or dietary supplements you use. Also tell them if you smoke, drink alcohol, or use illegal drugs. Some items may interact with your medicine. What should I watch for while using this medicine? This drug may make you feel generally unwell. Continue your course of treatment even though you feel ill unless your doctor tells you to stop. You may need blood work done while you are taking this medicine. Do not become pregnant while taking this medicine or for 5 months after stopping it.  Women should inform their doctor if they wish to become pregnant or think they might be pregnant. There is a potential for serious side effects to an unborn child. Talk to your health care professional or pharmacist for more information.  Do not breast-feed an infant while taking this medicine or for 5 months after stopping it. What side effects may I notice from receiving this medicine? Side effects that you should report to your doctor or health care professional as soon as possible: -allergic reactions like skin rash, itching or hives, swelling of the face, lips, or tongue -breathing problems -blood in the urine -bloody or watery diarrhea or black, tarry stools -changes in emotions or moods -changes in vision -chest pain -cough -dizziness -feeling faint or lightheaded, falls -fever, chills -headache with fever, neck stiffness, confusion, loss of memory, sensitivity to light, hallucination, loss of contact with reality, or seizures -joint pain -mouth sores -redness, blistering, peeling or loosening of the skin, including inside the mouth -severe muscle pain or weakness -signs and symptoms of high blood sugar such as dizziness; dry mouth; dry skin; fruity breath; nausea; stomach pain; increased hunger or thirst; increased urination -signs and symptoms of kidney injury like trouble passing urine or change in the amount of urine -signs and symptoms of liver injury like dark yellow or brown urine; general ill feeling or flu-like symptoms; light-colored stools; loss of appetite; nausea; right upper belly pain; unusually weak or tired; yellowing of the eyes or skin -swelling of the ankles, feet, hands -trouble passing urine or change in the amount of urine -unusually weak or tired -weight gain or loss Side effects that usually do not require medical attention (report to your doctor or health care professional if they continue or are bothersome): -bone pain -constipation -decreased appetite -diarrhea -muscle pain -nausea, vomiting -tiredness This list may not describe all possible side effects. Call your doctor for medical advice about side effects. You may report side effects to FDA at 1-800-FDA-1088. Where should I  keep my medicine? This drug is given in a hospital or clinic and will not be stored at home. NOTE: This sheet is a summary. It may not cover all possible information. If you have questions about this medicine, talk to your doctor, pharmacist, or health care provider.  2019 Elsevier/Gold Standard (2018-04-02 12:55:04)

## 2019-05-21 NOTE — Progress Notes (Signed)
Hematology and Oncology Follow Up Visit  Brian [name] [id number] 1961-11-05 58 y.o. 05/21/2019   Principle Diagnosis:  Recurrent Hodgkin's Disease -  S/p CAR-T therapy  TIA-resolved  Current Therapy:  Nivolumab q month -- s/p cycle #8       Interim History:  [name] is in for follow-up.  He is doing okay right now.  He still working.  He works from home.  He actually enjoys this.  He is saving money on gas and on wear and tear on his car.  He has a very nice relationship now.  It is with a nice woman.  She really seems to be taking good care of him.  He is very happy about this.  He had a PET scan done on Apr 22, 2019.  Thankfully, there is nothing that suggested any problems with recurrent Hodgkin's disease.  He recently saw his urologist.  He had a PSA that was 2.7.  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  He saw his neurologist back in May.  He sees her again in August.  He is going to go up to [place] I think this weekend.  Apparently, his brother and family had the coronavirus.  However, they are now cleared of it.  I told him that I do not see a problem with him going up there and staying with him.  He is going to go out to the [employer] go fishing.  I am very jealous of this.  Overall, he has had no physical complaints.  He has had no cough.  He has had no fever.  He has had no rashes.  There is no bleeding.  There is no change in bowel or bladder habits.  He has had no nausea or vomiting.  He has had no headache.  Overall, his performance status is ECOG 0.    Medications:  Current Outpatient Medications:  .  Cholecalciferol (VITAMIN D3) 125 MCG (5000 UT) CAPS, Take 5,000 Units by mouth 4 (four) times a week., Disp: , Rfl:  .  acetaminophen (TYLENOL) 325 MG tablet, Take 650 mg by mouth daily as needed for mild pain. , Disp: , Rfl:  .  carbidopa-levodopa (SINEMET IR) 25-100 MG tablet, TAKE 1 TABLET BY MOUTH THREE TIMES DAILY.TAKE 1 EXTRA EVERY DAY IF  NEEDED, Disp: 360 tablet, Rfl: 1 .  pramipexole (MIRAPEX) 1 MG tablet, TAKE 1 TABLET(1 MG) BY MOUTH THREE TIMES DAILY, Disp: 270 tablet, Rfl: 1 .  sildenafil (REVATIO) 20 MG tablet, Take 1-5 pills daily as needed prior to intercourse, Disp: 30 tablet, Rfl: 1  Allergies: No Known Allergies  Past Medical History, Surgical history, Social history, and Family History were reviewed and updated.  Review of Systems: Review of Systems  Neurological: Positive for tremors.  All other systems reviewed and are negative.    Physical Exam:  weight is 188 lb (85.3 kg). His oral temperature is 98.1 F (36.7 C). His blood pressure is 136/83 and his pulse is 68. His respiration is 16 and oxygen saturation is 100%.   Physical Exam Vitals signs reviewed.  HENT:     Head: Normocephalic and atraumatic.  Eyes:     Pupils: Pupils are equal, round, and reactive to light.  Neck:     Musculoskeletal: Normal range of motion.  Cardiovascular:     Rate and Rhythm: Normal rate and regular rhythm.     Heart sounds: Normal heart sounds.  Pulmonary:     Effort:  Pulmonary effort is normal.     Breath sounds: Normal breath sounds.  Abdominal:     General: Bowel sounds are normal.     Palpations: Abdomen is soft.  Musculoskeletal: Normal range of motion.        General: No tenderness or deformity.  Lymphadenopathy:     Cervical: No cervical adenopathy.  Skin:    General: Skin is warm and dry.     Findings: No erythema or rash.  Neurological:     Mental Status: He is alert and oriented to person, place, and time.  Psychiatric:        Behavior: Behavior normal.        Thought Content: Thought content normal.        Judgment: Judgment normal.     Lab Results  Component Value Date   WBC 5.7 05/21/2019   HGB 14.0 05/21/2019   HCT 43.0 05/21/2019   MCV 97.9 05/21/2019   PLT 205 05/21/2019     Chemistry      Component Value Date/Time   NA 140 05/21/2019 1213   NA 142 04/02/2017   NA 140  07/19/2016 1305   K 4.3 05/21/2019 1213   K 4.4 07/19/2016 1305   CL 104 05/21/2019 1213   CL 105 02/08/2016 1117   CL 107 03/25/2013 0828   CO2 29 05/21/2019 1213   CO2 26 07/19/2016 1305   BUN 22 (H) 05/21/2019 1213   BUN 21 04/02/2017   BUN 22.8 07/19/2016 1305   CREATININE 1.07 05/21/2019 1213   CREATININE 1.1 07/19/2016 1305   GLU 105 04/02/2017      Component Value Date/Time   CALCIUM 9.5 05/21/2019 1213   CALCIUM 9.8 07/19/2016 1305   ALKPHOS 66 05/21/2019 1213   ALKPHOS 76 07/19/2016 1305   AST 18 05/21/2019 1213   AST 18 07/19/2016 1305   ALT 9 05/21/2019 1213   ALT 14 07/19/2016 1305   BILITOT 0.6 05/21/2019 1213   BILITOT 0.70 07/19/2016 1305         Impression and Plan: Mr. Hair is a 58 year old gentleman.  He has history of recurrent Hodgkin's disease. 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 He then had a recurrence after this.  He did undergo CAR[facility]T therapy at [facility].  He did well with this.  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  This was proven by biopsy.  He had radiation therapy for this.  I told him that as long as the nivolumab is working, there is no need to make any changes with it.  I will try to move his appointments out to every 6 weeks now.  I think this would still be okay with the immunotherapy.  I do not think we need another PET scan probably until November.  Apparently, his insurance changes over in July.  He is worried about the cost of his nivolumab.  We will work with this.  I will see him back in 6 weeks.Marland Kitchen       [name], MD 6/25/20202:12 PM

## 2019-05-22 LAB — TSH: TSH: 3.879 u[IU]/mL (ref 0.320–4.118)

## 2019-06-18 ENCOUNTER — Other Ambulatory Visit: Payer: Commercial Managed Care - PPO

## 2019-06-18 ENCOUNTER — Ambulatory Visit: Payer: Commercial Managed Care - PPO

## 2019-06-18 ENCOUNTER — Ambulatory Visit: Payer: Commercial Managed Care - PPO | Admitting: Hematology & Oncology

## 2019-06-19 ENCOUNTER — Other Ambulatory Visit: Payer: Self-pay

## 2019-06-19 MED ORDER — PRAMIPEXOLE DIHYDROCHLORIDE 1 MG PO TABS: ORAL_TABLET | ORAL | 1 refills | Status: DC

## 2019-06-19 NOTE — Telephone Encounter (Signed)
Requested Prescriptions   Pending Prescriptions Disp Refills  . pramipexole (MIRAPEX) 1 MG tablet 270 tablet 1    Sig: TAKE 1 TABLET(1 MG) BY MOUTH THREE TIMES DAILY   Rx last filled: 12/09/18 #270 1 refills  Pt last seen: 01/06/19  Follow up appt scheduled: 07/07/19

## 2019-07-02 ENCOUNTER — Inpatient Hospital Stay: Payer: Commercial Managed Care - PPO | Attending: Hematology & Oncology

## 2019-07-02 ENCOUNTER — Inpatient Hospital Stay: Payer: Commercial Managed Care - PPO

## 2019-07-02 ENCOUNTER — Inpatient Hospital Stay (HOSPITAL_BASED_OUTPATIENT_CLINIC_OR_DEPARTMENT_OTHER): Payer: Commercial Managed Care - PPO | Admitting: Hematology & Oncology

## 2019-07-02 ENCOUNTER — Other Ambulatory Visit: Payer: Self-pay

## 2019-07-02 ENCOUNTER — Encounter: Payer: Self-pay | Admitting: Hematology & Oncology

## 2019-07-02 VITALS — BP 137/89 | HR 71 | Temp 97.8°F | Resp 20 | Wt 190.4 lb

## 2019-07-02 DIAGNOSIS — Z9484 Stem cells transplant status: Secondary | ICD-10-CM | POA: Diagnosis not present

## 2019-07-02 DIAGNOSIS — C8195 Hodgkin lymphoma, unspecified, lymph nodes of inguinal region and lower limb: Secondary | ICD-10-CM

## 2019-07-02 DIAGNOSIS — Z79899 Other long term (current) drug therapy: Secondary | ICD-10-CM | POA: Diagnosis not present

## 2019-07-02 DIAGNOSIS — Z923 Personal history of irradiation: Secondary | ICD-10-CM | POA: Insufficient documentation

## 2019-07-02 DIAGNOSIS — Z8571 Personal history of Hodgkin lymphoma: Secondary | ICD-10-CM | POA: Diagnosis not present

## 2019-07-02 DIAGNOSIS — G20 Parkinson's disease: Secondary | ICD-10-CM | POA: Insufficient documentation

## 2019-07-02 LAB — CBC WITH DIFFERENTIAL (CANCER CENTER ONLY)
Abs Immature Granulocytes: 0.02 10*3/uL (ref 0.00–0.07)
Basophils Absolute: 0 10*3/uL (ref 0.0–0.1)
Basophils Relative: 1 %
Eosinophils Absolute: 0.3 10*3/uL (ref 0.0–0.5)
Eosinophils Relative: 6 %
HCT: 43.8 % (ref 39.0–52.0)
Hemoglobin: 14.3 g/dL (ref 13.0–17.0)
Immature Granulocytes: 0 %
Lymphocytes Relative: 14 %
Lymphs Abs: 0.7 10*3/uL (ref 0.7–4.0)
MCH: 31.8 pg (ref 26.0–34.0)
MCHC: 32.6 g/dL (ref 30.0–36.0)
MCV: 97.6 fL (ref 80.0–100.0)
Monocytes Absolute: 0.5 10*3/uL (ref 0.1–1.0)
Monocytes Relative: 11 %
Neutro Abs: 3.3 10*3/uL (ref 1.7–7.7)
Neutrophils Relative %: 68 %
Platelet Count: 223 10*3/uL (ref 150–400)
RBC: 4.49 MIL/uL (ref 4.22–5.81)
RDW: 12.8 % (ref 11.5–15.5)
WBC Count: 4.8 10*3/uL (ref 4.0–10.5)
nRBC: 0 % (ref 0.0–0.2)

## 2019-07-02 LAB — CMP (CANCER CENTER ONLY)
ALT: 5 U/L (ref 0–44)
AST: 15 U/L (ref 15–41)
Albumin: 4.3 g/dL (ref 3.5–5.0)
Alkaline Phosphatase: 71 U/L (ref 38–126)
Anion gap: 7 (ref 5–15)
BUN: 22 mg/dL — ABNORMAL HIGH (ref 6–20)
CO2: 28 mmol/L (ref 22–32)
Calcium: 8.8 mg/dL — ABNORMAL LOW (ref 8.9–10.3)
Chloride: 105 mmol/L (ref 98–111)
Creatinine: 1 mg/dL (ref 0.61–1.24)
GFR, Est AFR Am: >60 mL/min (ref >60)
GFR, Estimated: >60 mL/min (ref >60)
Glucose, Bld: 93 mg/dL (ref 70–99)
Potassium: 4.2 mmol/L (ref 3.5–5.1)
Sodium: 140 mmol/L (ref 135–145)
Total Bilirubin: 0.6 mg/dL (ref 0.3–1.2)
Total Protein: 6.6 g/dL (ref 6.5–8.1)

## 2019-07-02 LAB — LACTATE DEHYDROGENASE: LDH: 212 U/L — ABNORMAL HIGH (ref 98–192)

## 2019-07-02 MED ORDER — SODIUM CHLORIDE 0.9 % IV SOLN
Freq: Once | INTRAVENOUS | Status: AC
  Administered 2019-07-02: 10:00:00 via INTRAVENOUS
  Filled 2019-07-02: qty 250

## 2019-07-02 MED ORDER — SODIUM CHLORIDE 0.9 % IV SOLN
480.0000 mg | Freq: Once | INTRAVENOUS | Status: AC
  Administered 2019-07-02: 480 mg via INTRAVENOUS
  Filled 2019-07-02: qty 48

## 2019-07-02 NOTE — Progress Notes (Signed)
Hematology and Oncology Follow Up Visit  YAIR DUSZA [id number] January 03, 1961 58 y.o. 07/02/2019   Principle Diagnosis:  Recurrent Hodgkin's Disease -  S/p CAR-T therapy  TIA-resolved  Current Therapy:  Nivolumab q month -- s/p cycle #9       Interim History:  Mr.  Latour is in for follow-up.  So far, he is doing pretty well.  Looks like the Parkinson's might be doing a little bit better.  He seems to be walking a little bit better.  He has had no problems with fever.  He has had no nausea or vomiting.  Has had no cough.  He is eating quite well.  He feels like he needs to cut back on the caloric intake.  He has had no problems with bowels or bladder.  There is been no rashes.  He has had no leg swelling.    Overall, his performance status is ECOG 0.    Medications:  Current Outpatient Medications:  .  acetaminophen (TYLENOL) 325 MG tablet, Take 650 mg by mouth daily as needed for mild pain. , Disp: , Rfl:  .  carbidopa-levodopa (SINEMET IR) 25-100 MG tablet, TAKE 1 TABLET BY MOUTH THREE TIMES DAILY.TAKE 1 EXTRA EVERY DAY IF NEEDED, Disp: 360 tablet, Rfl: 1 .  Cholecalciferol (VITAMIN D3) 125 MCG (5000 UT) CAPS, Take 5,000 Units by mouth 4 (four) times a week., Disp: , Rfl:  .  pramipexole (MIRAPEX) 1 MG tablet, TAKE 1 TABLET(1 MG) BY MOUTH THREE TIMES DAILY, Disp: 270 tablet, Rfl: 1 .  sildenafil (REVATIO) 20 MG tablet, Take 1-5 pills daily as needed prior to intercourse, Disp: 30 tablet, Rfl: 1  Allergies: No Known Allergies  Past Medical History, Surgical history, Social history, and Family History were reviewed and updated.  Review of Systems: Review of Systems  Neurological: Positive for tremors.  All other systems reviewed and are negative.    Physical Exam:  weight is 190 lb 6.4 oz (86.4 kg). His oral temperature is 97.8 F (36.6 C). His blood pressure is 137/89 and his pulse is 71. His respiration is 20 and oxygen saturation is 100%.   Physical Exam Vitals signs  reviewed.  HENT:     Head: Normocephalic and atraumatic.  Eyes:     Pupils: Pupils are equal, round, and reactive to light.  Neck:     Musculoskeletal: Normal range of motion.  Cardiovascular:     Rate and Rhythm: Normal rate and regular rhythm.     Heart sounds: Normal heart sounds.  Pulmonary:     Effort: Pulmonary effort is normal.     Breath sounds: Normal breath sounds.  Abdominal:     General: Bowel sounds are normal.     Palpations: Abdomen is soft.  Musculoskeletal: Normal range of motion.        General: No tenderness or deformity.  Lymphadenopathy:     Cervical: No cervical adenopathy.  Skin:    General: Skin is warm and dry.     Findings: No erythema or rash.  Neurological:     Mental Status: He is alert and oriented to person, place, and time.  Psychiatric:        Behavior: Behavior normal.        Thought Content: Thought content normal.        Judgment: Judgment normal.     Lab Results  Component Value Date   WBC 4.8 07/02/2019   HGB 14.3 07/02/2019   HCT 43.8 07/02/2019   MCV  97.6 07/02/2019   PLT 223 07/02/2019     Chemistry      Component Value Date/Time   NA 140 07/02/2019 0859   NA 142 04/02/2017   NA 140 07/19/2016 1305   K 4.2 07/02/2019 0859   K 4.4 07/19/2016 1305   CL 105 07/02/2019 0859   CL 105 02/08/2016 1117   CL 107 03/25/2013 0828   CO2 28 07/02/2019 0859   CO2 26 07/19/2016 1305   BUN 22 (H) 07/02/2019 0859   BUN 21 04/02/2017   BUN 22.8 07/19/2016 1305   CREATININE 1.00 07/02/2019 0859   CREATININE 1.1 07/19/2016 1305   GLU 105 04/02/2017      Component Value Date/Time   CALCIUM 8.8 (L) 07/02/2019 0859   CALCIUM 9.8 07/19/2016 1305   ALKPHOS 71 07/02/2019 0859   ALKPHOS 76 07/19/2016 1305   AST 15 07/02/2019 0859   AST 18 07/19/2016 1305   ALT 5 07/02/2019 0859   ALT 14 07/19/2016 1305   BILITOT 0.6 07/02/2019 0859   BILITOT 0.70 07/19/2016 1305         Impression and Plan: Mr. Joines is a 58 year old  gentleman.  He has history of recurrent Hodgkin's disease. 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 He then had a recurrence after this.  He did undergo CAR-T therapy at [facility].  He did well with this.  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  This was proven by biopsy.  He had radiation therapy for this.  I told him that as long as the nivolumab is working, there is no need to make any changes with it.  I do not think we need another PET scan probably until November.  I will see him back in 6 weeks.Marland Kitchen       [name], MD 8/6/20209:47 AM

## 2019-07-02 NOTE — Patient Instructions (Signed)
Nivolumab injection What is this medicine? NIVOLUMAB (nye VOL ue mab) is a monoclonal antibody. It is used to treat melanoma, lung cancer, kidney cancer, head and neck cancer, Hodgkin lymphoma, urothelial cancer, colon cancer, and liver cancer. This medicine may be used for other purposes; ask your health care provider or pharmacist if you have questions. COMMON BRAND NAME(S): Opdivo What should I tell my health care provider before I take this medicine? They need to know if you have any of these conditions:  diabetes  immune system problems  kidney disease  liver disease  lung disease  organ transplant  stomach or intestine problems  thyroid disease  an unusual or allergic reaction to nivolumab, other medicines, foods, dyes, or preservatives  pregnant or trying to get pregnant  breast-feeding How should I use this medicine? This medicine is for infusion into a vein. It is given by a health care professional in a hospital or clinic setting. A special MedGuide will be given to you before each treatment. Be sure to read this information carefully each time. Talk to your pediatrician regarding the use of this medicine in children. While this drug may be prescribed for children as young as 12 years for selected conditions, precautions do apply. Overdosage: If you think you have taken too much of this medicine contact a poison control center or emergency room at once. NOTE: This medicine is only for you. Do not share this medicine with others. What if I miss a dose? It is important not to miss your dose. Call your doctor or health care professional if you are unable to keep an appointment. What may interact with this medicine? Interactions have not been studied. Give your health care provider a list of all the medicines, herbs, non-prescription drugs, or dietary supplements you use. Also tell them if you smoke, drink alcohol, or use illegal drugs. Some items may interact with your  medicine. This list may not describe all possible interactions. Give your health care provider a list of all the medicines, herbs, non-prescription drugs, or dietary supplements you use. Also tell them if you smoke, drink alcohol, or use illegal drugs. Some items may interact with your medicine. What should I watch for while using this medicine? This drug may make you feel generally unwell. Continue your course of treatment even though you feel ill unless your doctor tells you to stop. You may need blood work done while you are taking this medicine. Do not become pregnant while taking this medicine or for 5 months after stopping it. Women should inform their doctor if they wish to become pregnant or think they might be pregnant. There is a potential for serious side effects to an unborn child. Talk to your health care professional or pharmacist for more information. Do not breast-feed an infant while taking this medicine or for 5 months after stopping it. What side effects may I notice from receiving this medicine? Side effects that you should report to your doctor or health care professional as soon as possible:  allergic reactions like skin rash, itching or hives, swelling of the face, lips, or tongue  breathing problems  blood in the urine  bloody or watery diarrhea or black, tarry stools  changes in emotions or moods  changes in vision  chest pain  cough  dizziness  feeling faint or lightheaded, falls  fever, chills  headache with fever, neck stiffness, confusion, loss of memory, sensitivity to light, hallucination, loss of contact with reality, or seizures  joint   pain  mouth sores  redness, blistering, peeling or loosening of the skin, including inside the mouth  severe muscle pain or weakness  signs and symptoms of high blood sugar such as dizziness; dry mouth; dry skin; fruity breath; nausea; stomach pain; increased hunger or thirst; increased urination  signs and  symptoms of kidney injury like trouble passing urine or change in the amount of urine  signs and symptoms of liver injury like dark yellow or brown urine; general ill feeling or flu-like symptoms; light-colored stools; loss of appetite; nausea; right upper belly pain; unusually weak or tired; yellowing of the eyes or skin  swelling of the ankles, feet, hands  trouble passing urine or change in the amount of urine  unusually weak or tired  weight gain or loss Side effects that usually do not require medical attention (report to your doctor or health care professional if they continue or are bothersome):  bone pain  constipation  decreased appetite  diarrhea  muscle pain  nausea, vomiting  tiredness This list may not describe all possible side effects. Call your doctor for medical advice about side effects. You may report side effects to FDA at 1-800-FDA-1088. Where should I keep my medicine? This drug is given in a hospital or clinic and will not be stored at home. NOTE: This sheet is a summary. It may not cover all possible information. If you have questions about this medicine, talk to your doctor, pharmacist, or health care provider.  2020 [employer] (2018-04-02 12:55:04)  

## 2019-07-06 NOTE — Progress Notes (Signed)
Brian [name] was seen today in the movement disorders clinic for neurologic consultation at the request of Copland, [name], MD.  The consultation is for the evaluation of parkinsonism.  I have reviewed numerous records made available to me.  The patient previously saw Dr. [name] and subsequently Dr. [name].  He saw Dr. [name] when he was in the hospital in April, 2016.  That was for an event in which he experienced 1-2 minutes of speech and word finding trouble.  Dr. [name] doubted that was a TIA.  He had an EEG that was negative.  He had an MRI of the brain on 03/04/2015 without gadolinium that I had the opportunity to review.  There were a very few scattered T2 hyperintensities, but diffusion-weighted imaging was negative.  MRSA was negative.  He followed up with Dr. [name] one time on an outpatient basis, but nothing further was revealed.  Today, the patient presents because of shuffling of the feet for about 8 months.  Pt states that it started dragging of the L foot but now seems to be dragging both.  He has also noted handwriting and voice changes and has become concerned about the possibility of Parkinson's disease.  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  He also has a history of chemotherapy-induced peripheral neuropathy (Adcetris).  He asks about "psychogenic stuff."  States that he has been through divorce along with his cancer.  Asks if his sx's could be from stress as he has been reading about psychogenic manifestations of sx's.  08/22/16 update: The patient follows up today for testing results.  This patient is accompanied in the office by his ex wife who supplements the history.  He had a DaT scan done on 08/16/16 and showed abnormal grade 3 uptake, virtually absent bilaterally in both putamen and caudate nuclei.    He has not had any falls since last visit.  Continues to have difficulty with picking up the feet.  He denies lightheadedness or near syncope.  He denies hallucinations.  He continues to have issues with depression.  No SI/HI.  Ex wife concerned about mood.  Also noting stiffness, some drooling, constipation.    11/27/16 update: The patient follows up today, accompanied by his ex-wife (and friend) who supplements the history.  The patient was started on pramipexole last visit and worked up to 0.5 mg 3 times per day.  He denies any compulsive behaviors or sleep attacks.  He states that he is doing better but not as good as he thought that he would.  He is shuffling some.  He is exercising with his bike.  He has been only to 2 physical therapy sessions.  He declined speech and occupational therapy, despite the fact that the therapist recommended them as did I.  He has not had any falls.  Denies lightheadedness or syncope.  He continues to struggle with depression but states that mood has been better.  Last visit, he was given names of psychiatrist but refused to go, stating that he did not need a psychiatrist and had a Social worker.  He called me for a prescription for Xanax, but this is not a medication that I generally recommended my Parkinson's patients.  03/05/17 update:  Patient follows up today.  He is unaccompanied today.   The  patient is on pramipexole but we increased that last visit to 1.0 mg tid.  No falls but some near falls.  He is doing rock steady one day a week.  There've been no compulsive behaviors.  No sleep attacks.  No hallucinations.  No lightheadedness or near syncope.  He was seen last by the bone marrow transplant team at [facility] in January, 2018.  I have reviewed those records.  He is currently disease-free from a lymphoma standpoint.  07/09/17 update:  Pt f/u today for PD.  The records that were made available to me were reviewed since last visit.  He had a repeat PET scan at [facility] that  was also interpreted at [facility].  There were hypermetabolic nodes in the mediastinal region.  Findings were concerning for recurrence of lymphoma.  Pt states that the plan is to repeat it in September.  In progress to Parkinson's disease, the patient is still on pramipexole 1.0 mg tid.  He is feeling like he is always walking on the balls of the feet and describing a festinating gait.  He has some feeling of retropulsion.  No compulsive behaviors.  No falls.  No lightheadedness/near syncope.  He is exercising on his bike a few times per week.  He is profusely sweating.  Did fall out of bed onto the nightstand while asleep.  11/07/17 update:  Pt seen in f/u for PD.  The records that were made available to me were reviewed.  Had a PET scan demonstrating hypermetabolic lesion at T1.  Had MRI thoracic spine that confirmed focal metastatic disease.  Ultimately had a bx to confirm it was return of lymphoma and bx was suggestive of such.  Has been receiving focal radiation to that area and finished that on tuesday.  In regards to PD he is on pramipexole 1.0 mg tid and last visit we started carbidopa/levodopa 25/100 tid.  He wanted a 2nd opinion at [facility] interdisciplinary clinic.  It appears that this didn't happen (don't think that referral got placed).  Pt states that he decided he actually didn't want to go.  He is doing much better with the addition of carbidopa/levodopa so he didn't feel the need to go.  He is doing 5-6 miles on his bike a few times a week.  Pt denies falls.  Pt denies lightheadedness, near syncope.  No hallucinations.  Mood has been good.  04/03/18 update: Patient is seen today for follow-up for his Parkinson's disease.  I have reviewed records since our last visit.  The patient remains on carbidopa/levodopa 25/100, 1 tablet 3 times per day and pramipexole 1 mg 3 times per day.  He has not had any falls.  No lightheadedness or near syncope except when sometimes bends over in the middle of exercise he  will have a near syncopal feeling that goes away a min or so after he stands back up. Only does it with exercise.  Denies compulsive behaviors.   He has attended a new group for younger patients with Parkinson's.  Patient had a PET scan last on January 28, 2018 and scheduled for a repeat on May 01, 2018.  Last one demonstrated resolution of the focal hypermetabolic activity in the left transverse process of T1.  There was new focal uptake in the right lateral fourth rib, but this was without corresponding abnormality on CT.  08/21/18 update: Patient is seen today for follow-up regarding Parkinson's disease.  He is on carbidopa/levodopa 25/100, 1 tablet 3 times per day and  pramipexole 1 mg 3 times per day.  He does note hypersexual behavior but no prostitution.   He doesn't want to change medication.  No falls.  No sleep attacks.  No compulsive behaviors.  He had one incident while he was out jogging and he stopped to get his breath and was holding onto a pole and the next thing he was on his bottom.  He thinks that he was only out 5 seconds.  He hadn't eaten or drank anything but coffee that day.  That was about a month ago.  He does not some dizziness with bending over but not all the time. No hallucinations.  Reports depression based on a rocky relationship with girlfriend.  No SI/HI.  Notes that the stress will affect him physically with more shuffling.  More drooling and its frustrating but doesn't want med/botox.  Asks about marijuana/cbd.  01/06/19 update: Patient is seen today in follow-up for Parkinson's disease.  Patient is on carbidopa/levodopa 25/100, 1 tablet 3 times per day and pramipexole 1 mg 3 times per day.  In the past, he has had some hypersexual behavior on pramipexole.  He states that this continues to be appropriate and within relationships.  He did just recently returned from [place] and found that to be a very relaxing vacation.  He has not been exercising as much as he would like.  He gets  frustrated that the left foot seems to not work like it is supposed to, but attributes some of this to neuropathy.  He actually is going for acupuncture because of the uncomfortable nature of the foot.  He has bought bigger shoes because of this.  Records are reviewed since last visit.  He just saw Dr. Marin Olp on December 25, 2018.  No changes were made, but he continues to be seen at [facility].  He had a PET scan yesterday that was normal.  He admits to depression, but states that seems to come and go.  07/07/19 update: Patient seen today in follow-up for Parkinson's disease.  He is on carbidopa/levodopa 25/100, 1 tablet 3 times per day and pramipexole 1 mg 3 times per day.  With the exception of some hypersexual behavior (within appropriate relationships per the patient), he otherwise denies any compulsive behaviors and denies that this has been problematic for the other partner.  Pt denies falls.  Pt denies lightheadedness, near syncope.  No hallucinations.  Mood has been good.  Walking for exercise.  At the end of the day, he is shuffling.  He does think that appetite is increased.  Doing well from oncology standpoint.  Frustrated with limitations with exercise with pandemic and being stuck inside.  ALLERGIES:  No Known Allergies  CURRENT MEDICATIONS:  Outpatient Encounter Medications as of 07/07/2019  Medication Sig   acetaminophen (TYLENOL) 325 MG tablet Take 650 mg by mouth daily as needed for mild pain.    carbidopa-levodopa (SINEMET IR) 25-100 MG tablet TAKE 1 TABLET BY MOUTH THREE TIMES DAILY.TAKE 1 EXTRA EVERY DAY IF NEEDED   Cholecalciferol (VITAMIN D3) 125 MCG (5000 UT) CAPS Take 5,000 Units by mouth 4 (four) times a week.   pramipexole (MIRAPEX) 1 MG tablet TAKE 1 TABLET(1 MG) BY MOUTH THREE TIMES DAILY   sildenafil (REVATIO) 20 MG tablet Take 1-5 pills daily as needed prior to intercourse   No facility-administered encounter medications on file as of 07/07/2019.     PAST MEDICAL HISTORY:    Past Medical History:  Diagnosis Date   Anxiety  Dysrhythmia    past hx pvc   Goals of care, counseling/discussion 09/25/2018   H/O autologous stem cell transplant ([place])    may 2014  at [place]   History of Bell's palsy    2009  RIGHT SIDE--  HAS 80% FUNCTION / PT STATES A LITTLE ASYMETRICAL AND EFFECTS MOUTH   History of radiation therapy 10/18/17-11/05/17   sprine T1 26 Gy in 13 fractions, spine boost 10 Gy in 5 fractions   Hodgkin's disease, nodular sclerosis, of inguinal region/lower limb ([place]) ONOLOGIST--  DR [facility]NEVER AND A [facility]     SALVAGE CHEMO 2013/  AUTOLOGUS STEM CELL TRANSPLANT MAY 2014 AT [facility]   Mass of right chest wall 09/18/2018   Mass of right inguinal region    PVC (premature ventricular contraction)    "benign"   TIA (transient ischemic attack) 02/2015   "probable TIA"   Wears contact lenses     PAST SURGICAL HISTORY:   Past Surgical History:  Procedure Laterality Date   AXILLARY LYMPH NODE BIOPSY Left 02/02/2013   Procedure: NEEDLE LOCALIZED AXILLARY LYMPH NODE BIOPSY;  Surgeon: Haywood Lasso, MD;  Location: [place];  Service: General;  Laterality: Left;   BONE MARROW BIOPSY  08/2012   CYST REMOVAL NECK Left 1980   LEFT INGUINAL LYMPH NODE BX  09-09-2012   LYMPH NODE BIOPSY N/A 07/28/2015   Procedure: LYMPH NODE BIOPSY;  Surgeon: Melrose Nakayama, MD;  Location: [place];  Service: Thoracic;  Laterality: N/A;   MASS EXCISION Right 09/19/2018   Procedure: EXCISION OF RIGHT CHEST WALL MASS ERAS PATHWAY;  Surgeon: Fanny Skates, MD;  Location: [place];  Service: General;  Laterality: Right;   NODE DISSECTION Right 07/28/2015   Procedure: NODE DISSECTION;  Surgeon: Melrose Nakayama, MD;  Location: [place];  Service: Thoracic;  Laterality: Right;   PLEURA BIOPSY Left 05/31/2014   REMOVAL RIGHT INGUINAL LYMPH NODES  08-16-2011   SCROTAL EXPLORATION Right 01/04/2014   Procedure: SCROTUM EXPLORATION   INGUINAL  , EXCISION OF CYSTIC MASS OF RIGHT SPERMATIC CORD, [place];  Surgeon: Franchot Gallo, MD;  Location: [facility];  Service: Urology;  Laterality: Right;   TRANSTHORACIC ECHOCARDIOGRAM  03-18-2013   MILD LVH/  EF 55-60%   VIDEO ASSISTED THORACOSCOPY Left 05/31/2014   Procedure: LEFT VIDEO ASSISTED THORACOSCOPY, PLEURAL BIOPSY;  Surgeon: Melrose Nakayama, MD;  Location: [place];  Service: Thoracic;  Laterality: Left;   VIDEO ASSISTED THORACOSCOPY Right 07/28/2015   Procedure: RIGHT VIDEO ASSISTED THORACOSCOPY;  Surgeon: Melrose Nakayama, MD;  Location: [place];  Service: Thoracic;  Laterality: Right;   VIDEO ASSISTED THORACOSCOPY (VATS)/ LYMPH NODE SAMPLING Right 07/28/2015   VIDEO ASSISTED THORACOSCOPY (VATS)/WEDGE RESECTION  05/31/2014   VIDEO BRONCHOSCOPY WITH ENDOBRONCHIAL ULTRASOUND  07/28/2015   VIDEO BRONCHOSCOPY WITH ENDOBRONCHIAL ULTRASOUND N/A 07/28/2015   Procedure: VIDEO BRONCHOSCOPY WITH ENDOBRONCHIAL ULTRASOUND;  Surgeon: Melrose Nakayama, MD;  Location: [facility];  Service: Thoracic;  Laterality: N/A;    SOCIAL HISTORY:   Social History   Socioeconomic History   Marital status: Divorced    Spouse name: Not on file   Number of children: 2   Years of education: Not on file   Highest education level: Bachelor's degree (e.g., BA, AB, BS)  Occupational History   Occupation: Oncologist: [employer] resource strain: Not on file   Food insecurity    Worry: Not on  file    Inability: Not on file   Transportation needs    Medical: Not on file    Non-medical: Not on file  Tobacco Use   Smoking status: Never Smoker   Smokeless tobacco: Never Used  Substance and Sexual Activity   Alcohol use: Yes    Alcohol/week: 14.0 standard drinks    Types: 7 Cans of beer, 7 Glasses of wine per week   Drug use: No   Sexual activity: Not Currently  Lifestyle   Physical activity    Days per week: Not on  file    Minutes per session: Not on file   Stress: Not on file  Relationships   Social connections    Talks on phone: Not on file    Gets together: Not on file    Attends religious service: Not on file    Active member of club or organization: Not on file    Attends meetings of clubs or organizations: Not on file    Relationship status: Not on file   Intimate partner violence    Fear of current or ex partner: Not on file    Emotionally abused: Not on file    Physically abused: Not on file    Forced sexual activity: Not on file  Other Topics Concern   Not on file  Social History Narrative   DIVORCE. Education: [employer]. Exercise: jog/walk/ lift weights 7 days a week, 1-2 miles.   Right handed   2 children    FAMILY HISTORY:   Family Status  Relation Name Status   Mother  Deceased   Father  Deceased   Brother  Alive   Son  Alive   Son  Alive   Brother  Alive   Brother  Alive    ROS:  Review of Systems  Constitutional: Negative.   HENT: Negative.   Eyes: Negative.   Respiratory: Negative.   Cardiovascular: Negative.   Gastrointestinal: Negative.   Genitourinary: Negative.   Musculoskeletal: Negative.   Skin: Negative.   Endo/Heme/Allergies: Negative.      PHYSICAL EXAMINATION:    VITALS:   Vitals:   07/07/19 1118  BP: 135/90  Pulse: 76  SpO2: 99%  Weight: 187 lb (84.8 kg)  Height: _0  (1.778 m)       GEN:  The patient appears stated age and is in NAD.   HEENT:  Normocephalic, atraumatic.  The mucous membranes are moist. The superficial temporal arteries are without ropiness or tenderness. CV:    Tachycardic.  regular Lungs:  CTAB Neck/HEME:  There are no carotid bruits bilaterally.  Neurological examination:  Orientation: The patient is alert and oriented x3. Fund of knowledge is appropriate.  Cranial nerves: There is slight right facial droop, only noticeable because of decreased nasolabial fold on the right.  He is able to  symmetrically activate the muscles of facial expression.  He does have abnormal synkinetic reinnervation of the muscles of facial expression on the right.  He does not have a Bell's phenomenon.  There is facial hypomimia.    The speech is fluent and clear.    Soft palate rises symmetrically and there is no tongue deviation. Hearing is intact to conversational tone. Sensation: Sensation is intact to light throughout.   Movement examination: Tone: There is moderate rigidity in the left upper and lower extremity (15 minutes from taking next dose of medicine). Abnormal movements: None Coordination:  There is decremation only with heel and toe taps on the  left. Gait and Station: The patient has no difficulty arising out of a deep-seated chair without the use of the hands. The patient's stride length is normal today and he walks well.    Chemistry      Component Value Date/Time   NA 140 07/02/2019 0859   NA 142 04/02/2017   NA 140 07/19/2016 1305   K 4.2 07/02/2019 0859   K 4.4 07/19/2016 1305   CL 105 07/02/2019 0859   CL 105 02/08/2016 1117   CL 107 03/25/2013 0828   CO2 28 07/02/2019 0859   CO2 26 07/19/2016 1305   BUN 22 (H) 07/02/2019 0859   BUN 21 04/02/2017   BUN 22.8 07/19/2016 1305   CREATININE 1.00 07/02/2019 0859   CREATININE 1.1 07/19/2016 1305   GLU 105 04/02/2017      Component Value Date/Time   CALCIUM 8.8 (L) 07/02/2019 0859   CALCIUM 9.8 07/19/2016 1305   ALKPHOS 71 07/02/2019 0859   ALKPHOS 76 07/19/2016 1305   AST 15 07/02/2019 0859   AST 18 07/19/2016 1305   ALT 5 07/02/2019 0859   ALT 14 07/19/2016 1305   BILITOT 0.6 07/02/2019 0859   BILITOT 0.70 07/19/2016 1305       ASSESSMENT/PLAN:  1.   Parkinson's disease.  -The patient had a DaT scan on 08/16/2016 that showed abnormal grade 3 uptake, virtually absent bilaterally in both putamen and caudate nuclei.   -We discussed the diagnosis as well as pathophysiology of the disease.  We discussed treatment  options as well as prognostic indicators.  Patient education was provided.  -Patient does seem to have some hypersexual behavior from pramipexole, but does not want to change the medication.  Hypersexual behavior is not occurring outside of appropriate relationships.  He is aware that that behavior could be from pramipexole.  He is monitoring closely and is very open with me about this.  For now, he will continue pramipexole, 1 mg 3 times per day.  -increase carbidopa/levodopa 25/100, 2 in the AM, 2 in the afternoon, 1 in the evening.  It may be that it is just time for next dose and that is why he is rigid, but he has been this rigid the last several visits and I would like to try and increase the dosage.  He was agreeable.  We also talked about entacapone, if needed in the future.  -Discussed again the importance of getting back to safe, cardiovascular exercise.  He is thinking about buying a road bike for the paved trails.  We did talk about the risks of this.  He stated that he would be riding with his son and feels safe with this.  -Discussed DBS therapy, but he stated that he really did not think that he was ready for that.  2.  Chemo (adcetris) induced peripheral neuropathy  -Neuropathy has been stable, although somewhat bothersome to him.  He does not necessarily want to add more medication, however.  3.  R facial droop due to hx of cranial nerve 7 palsy  -has had negative neuroimaging (noncontrast)  -He does have abnormal synkinetic reinnervation of the right face, but this was actually somewhat better today.  4.  Hodgkins lymphoma, s/p autologous transplant with recurrence post transplant  -Repeat PET scan has been unremarkable.  5.  2016 transient (1-2 min) of word/speech trouble  -reviewed hospital records and agree that this likely did not represent TIA and if anything, more consistent with seizure.  In any case, the event  has not recurred.  6.  Depression  -Doing well in that  regard.  7.  RBD  -No further episodes of falling out of the bed.  Does not want clonazepam.  8. Sialorrhea  -This is commonly associated with PD.  We talked about treatments.  The patient is not a candidate for oral anticholinergic therapy because of increased risk of confusion and falls.  We discussed Botox (type A and B) and 1% atropine drops.  We discusssed that candy like lemon drops can help by stimulating mm of the oropharynx to induce swallowing.  He does not want more medication nor does he want Botox.  9.  Follow up is anticipated in the next 5 months, sooner should new neurologic issues arise.  Much greater than 50% of this visit was spent in counseling and coordinating care.  Total face to face time:  25 min

## 2019-07-07 ENCOUNTER — Other Ambulatory Visit: Payer: Self-pay

## 2019-07-07 ENCOUNTER — Ambulatory Visit (INDEPENDENT_AMBULATORY_CARE_PROVIDER_SITE_OTHER): Payer: Commercial Managed Care - PPO | Admitting: Neurology

## 2019-07-07 ENCOUNTER — Encounter: Payer: Self-pay | Admitting: Neurology

## 2019-07-07 VITALS — BP 135/90 | HR 76 | Ht 70.0 in | Wt 187.0 lb

## 2019-07-07 DIAGNOSIS — G20 Parkinson's disease: Secondary | ICD-10-CM | POA: Diagnosis not present

## 2019-07-07 NOTE — Patient Instructions (Addendum)
1.  Increase carbidopa/levodopa 25/100, 2 at 7am, 2 at 11am, 1 at 4pm 2.  Continue pramipexole 0.5 mg three times per day  The physicians and staff at [facility] are committed to providing excellent care. You may receive a survey requesting feedback about your experience at our office. We strive to receive "very good" responses to the survey questions. If you feel that your experience would prevent you from giving the office a "very good " response, please contact our office to try to remedy the situation. We may be reached at [phone]. Thank you for taking the time out of your busy day to complete the survey.

## 2019-07-27 ENCOUNTER — Other Ambulatory Visit: Payer: Self-pay | Admitting: Family Medicine

## 2019-07-27 DIAGNOSIS — N529 Male erectile dysfunction, unspecified: Secondary | ICD-10-CM

## 2019-08-13 ENCOUNTER — Inpatient Hospital Stay: Payer: Commercial Managed Care - PPO

## 2019-08-13 ENCOUNTER — Encounter: Payer: Self-pay | Admitting: Hematology & Oncology

## 2019-08-13 ENCOUNTER — Inpatient Hospital Stay: Payer: Commercial Managed Care - PPO | Attending: Hematology & Oncology | Admitting: Hematology & Oncology

## 2019-08-13 ENCOUNTER — Other Ambulatory Visit: Payer: Self-pay

## 2019-08-13 ENCOUNTER — Ambulatory Visit: Payer: Commercial Managed Care - PPO

## 2019-08-13 VITALS — BP 137/97 | HR 70 | Temp 96.9°F | Resp 18 | Wt 187.0 lb

## 2019-08-13 VITALS — BP 144/82 | HR 71 | Resp 18

## 2019-08-13 DIAGNOSIS — G20 Parkinson's disease: Secondary | ICD-10-CM | POA: Diagnosis not present

## 2019-08-13 DIAGNOSIS — Z79899 Other long term (current) drug therapy: Secondary | ICD-10-CM | POA: Insufficient documentation

## 2019-08-13 DIAGNOSIS — Z923 Personal history of irradiation: Secondary | ICD-10-CM | POA: Diagnosis not present

## 2019-08-13 DIAGNOSIS — Z23 Encounter for immunization: Secondary | ICD-10-CM

## 2019-08-13 DIAGNOSIS — Z9484 Stem cells transplant status: Secondary | ICD-10-CM | POA: Diagnosis not present

## 2019-08-13 DIAGNOSIS — Z8571 Personal history of Hodgkin lymphoma: Secondary | ICD-10-CM | POA: Insufficient documentation

## 2019-08-13 DIAGNOSIS — C8195 Hodgkin lymphoma, unspecified, lymph nodes of inguinal region and lower limb: Secondary | ICD-10-CM

## 2019-08-13 LAB — CMP (CANCER CENTER ONLY)
ALT: 3 U/L (ref 0–44)
AST: 14 U/L — ABNORMAL LOW (ref 15–41)
Albumin: 4.2 g/dL (ref 3.5–5.0)
Alkaline Phosphatase: 71 U/L (ref 38–126)
Anion gap: 7 (ref 5–15)
BUN: 27 mg/dL — ABNORMAL HIGH (ref 6–20)
CO2: 27 mmol/L (ref 22–32)
Calcium: 9.3 mg/dL (ref 8.9–10.3)
Chloride: 108 mmol/L (ref 98–111)
Creatinine: 1.09 mg/dL (ref 0.61–1.24)
GFR, Est AFR Am: >60 mL/min (ref >60)
GFR, Estimated: >60 mL/min (ref >60)
Glucose, Bld: 100 mg/dL — ABNORMAL HIGH (ref 70–99)
Potassium: 4.3 mmol/L (ref 3.5–5.1)
Sodium: 142 mmol/L (ref 135–145)
Total Bilirubin: 0.4 mg/dL (ref 0.3–1.2)
Total Protein: 6.4 g/dL — ABNORMAL LOW (ref 6.5–8.1)

## 2019-08-13 LAB — LACTATE DEHYDROGENASE: LDH: 203 U/L — ABNORMAL HIGH (ref 98–192)

## 2019-08-13 LAB — CBC WITH DIFFERENTIAL (CANCER CENTER ONLY)
Abs Immature Granulocytes: 0.02 10*3/uL (ref 0.00–0.07)
Basophils Absolute: 0.1 10*3/uL (ref 0.0–0.1)
Basophils Relative: 1 %
Eosinophils Absolute: 0.3 10*3/uL (ref 0.0–0.5)
Eosinophils Relative: 5 %
HCT: 42.9 % (ref 39.0–52.0)
Hemoglobin: 13.8 g/dL (ref 13.0–17.0)
Immature Granulocytes: 0 %
Lymphocytes Relative: 13 %
Lymphs Abs: 0.7 10*3/uL (ref 0.7–4.0)
MCH: 32.2 pg (ref 26.0–34.0)
MCHC: 32.2 g/dL (ref 30.0–36.0)
MCV: 100 fL (ref 80.0–100.0)
Monocytes Absolute: 0.6 10*3/uL (ref 0.1–1.0)
Monocytes Relative: 12 %
Neutro Abs: 3.5 10*3/uL (ref 1.7–7.7)
Neutrophils Relative %: 69 %
Platelet Count: 219 10*3/uL (ref 150–400)
RBC: 4.29 MIL/uL (ref 4.22–5.81)
RDW: 12.8 % (ref 11.5–15.5)
WBC Count: 5.1 10*3/uL (ref 4.0–10.5)
nRBC: 0 % (ref 0.0–0.2)

## 2019-08-13 MED ORDER — OLMESARTAN MEDOXOMIL 40 MG PO TABS: 40.0000 mg | ORAL_TABLET | Freq: Every day | ORAL | 6 refills | Status: DC

## 2019-08-13 MED ORDER — SODIUM CHLORIDE 0.9 % IV SOLN
Freq: Once | INTRAVENOUS | Status: AC
  Administered 2019-08-13: 10:00:00 via INTRAVENOUS
  Filled 2019-08-13: qty 250

## 2019-08-13 MED ORDER — INFLUENZA VAC SPLIT QUAD 0.5 ML IM SUSY
0.5000 mL | PREFILLED_SYRINGE | Freq: Once | INTRAMUSCULAR | Status: AC
  Administered 2019-08-13: 0.5 mL via INTRAMUSCULAR

## 2019-08-13 MED ORDER — INFLUENZA VAC SPLIT QUAD 0.5 ML IM SUSY
PREFILLED_SYRINGE | INTRAMUSCULAR | Status: AC
  Filled 2019-08-13: qty 0.5

## 2019-08-13 MED ORDER — SODIUM CHLORIDE 0.9 % IV SOLN
480.0000 mg | Freq: Once | INTRAVENOUS | Status: AC
  Administered 2019-08-13: 11:00:00 480 mg via INTRAVENOUS
  Filled 2019-08-13: qty 48

## 2019-08-13 NOTE — Progress Notes (Signed)
Hematology and Oncology Follow Up Visit  Brian [name] [id number] [birth year] 58 y.o. 08/13/2019   Principle Diagnosis:  Recurrent Hodgkin's Disease -  S/p CAR-T therapy  TIA-resolved  Current Therapy:  Nivolumab q month -- s/p cycle #9       Interim History:  [name] is in for follow-up.  So far, he is doing pretty well.  He said that his doctor increase his Parkinson's medicine.  He has had this because he was getting a little bit more stiff in his hands.  His blood pressure is on the high side.  It clearly runs in his family.  I do think that he is got need to be on an anti-hypertensive agent.  I will try him on Benicar at 40 mg p.o. daily.  He is back working out.  His gym opened up from the lockdown due to the coronavirus.  He is quite happy about this.  The sad news is that he lost his poor dog last week.  She had cancer.  There was very quick.  He really misses her.  His 2 boys also miss her a lot.  He is still eating quite well.  His lady friend is obviously a very good [place]ki.  I told him to tell her to make sure she watches the salt addition to what ever she cooks.     Overall, his performance status is ECOG 0.    Medications:  Current Outpatient Medications:  .  acetaminophen (TYLENOL) 325 MG tablet, Take 650 mg by mouth daily as needed for mild pain. , Disp: , Rfl:  .  carbidopa-levodopa (SINEMET IR) 25-100 MG tablet, TAKE 1 TABLET BY MOUTH THREE TIMES DAILY.TAKE 1 EXTRA EVERY DAY IF NEEDED (Patient taking differently: 2 at 7am/2 at 11am/1 at 4pm), Disp: 360 tablet, Rfl: 1 .  Cholecalciferol (VITAMIN D3) 125 MCG (5000 UT) CAPS, Take 5,000 Units by mouth 4 (four) times a week., Disp: , Rfl:  .  pramipexole (MIRAPEX) 1 MG tablet, TAKE 1 TABLET(1 MG) BY MOUTH THREE TIMES DAILY, Disp: 270 tablet, Rfl: 1 .  sildenafil (REVATIO) 20 MG tablet, TAKE 1 TO 5 TABLETS BY MOUTH DAILY AS NEEDED PRIOR TO INTERCOURSE, Disp: 30 tablet, Rfl: 0  Allergies: No Known Allergies  Past Medical  History, Surgical history, Social history, and Family History were reviewed and updated.  Review of Systems: Review of Systems  Neurological: Positive for tremors.  All other systems reviewed and are negative.    Physical Exam:  weight is 187 lb (84.8 kg). His oral temperature is 96.9 F (36.1 C) (abnormal). His blood pressure is 137/97 (abnormal) and his pulse is 70. His respiration is 18 and oxygen saturation is 100%.   Physical Exam Vitals signs reviewed.  HENT:     Head: Normocephalic and atraumatic.  Eyes:     Pupils: Pupils are equal, round, and reactive to light.  Neck:     Musculoskeletal: Normal range of motion.  Cardiovascular:     Rate and Rhythm: Normal rate and regular rhythm.     Heart sounds: Normal heart sounds.  Pulmonary:     Effort: Pulmonary effort is normal.     Breath sounds: Normal breath sounds.  Abdominal:     General: Bowel sounds are normal.     Palpations: Abdomen is soft.  Musculoskeletal: Normal range of motion.        General: No tenderness or deformity.  Lymphadenopathy:     Cervical: No cervical adenopathy.  Skin:  General: Skin is warm and dry.     Findings: No erythema or rash.  Neurological:     Mental Status: He is alert and oriented to person, place, and time.  Psychiatric:        Behavior: Behavior normal.        Thought Content: Thought content normal.        Judgment: Judgment normal.     Lab Results  Component Value Date   WBC 5.1 08/13/2019   HGB 13.8 08/13/2019   HCT 42.9 08/13/2019   MCV 100.0 08/13/2019   PLT 219 08/13/2019     Chemistry      Component Value Date/Time   NA 142 08/13/2019 0900   NA 142 04/02/2017   NA 140 07/19/2016 1305   K 4.3 08/13/2019 0900   K 4.4 07/19/2016 1305   CL 108 08/13/2019 0900   CL 105 02/08/2016 1117   CL 107 03/25/2013 0828   CO2 27 08/13/2019 0900   CO2 26 07/19/2016 1305   BUN 27 (H) 08/13/2019 0900   BUN 21 04/02/2017   BUN 22.8 07/19/2016 1305   CREATININE 1.09  08/13/2019 0900   CREATININE 1.1 07/19/2016 1305   GLU 105 04/02/2017      Component Value Date/Time   CALCIUM 9.3 08/13/2019 0900   CALCIUM 9.8 07/19/2016 1305   ALKPHOS 71 08/13/2019 0900   ALKPHOS 76 07/19/2016 1305   AST 14 (L) 08/13/2019 0900   AST 18 07/19/2016 1305   ALT 3 08/13/2019 0900   ALT 14 07/19/2016 1305   BILITOT 0.4 08/13/2019 0900   BILITOT 0.70 07/19/2016 1305         Impression and Plan: Brian [name] is a 58 year old gentleman.  He has history of recurrent Hodgkin's disease. 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 He then had a recurrence after this.  He did undergo CAR-T therapy at [facility].  He did well with this.  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  This was proven by biopsy.  He had radiation therapy for this.  I would go ahead and get another PET scan on him.  We have this before we see him back.  We will see him back the end of October.  If everything looks fine on the PET scan, then probably will increase the time between his nivolumab treatments.  I plywood go to every 2 months.  I will see him back in 6 weeks.Marland Kitchen       [name], MD 9/17/202010:03 AM

## 2019-08-13 NOTE — Patient Instructions (Signed)
Influenza, Adult Influenza is also called "the flu." It is an infection in the lungs, nose, and throat (respiratory tract). It is caused by a virus. The flu causes symptoms that are similar to symptoms of a cold. It also causes a high fever and body aches. The flu spreads easily from person to person (is contagious). Getting a flu shot (influenza vaccination) every year is the best way to prevent the flu. What are the causes? This condition is caused by the influenza virus. You can get the virus by:  Breathing in droplets that are in the air from the cough or sneeze of a person who has the virus.  Touching something that has the virus on it (is contaminated) and then touching your mouth, nose, or eyes. What increases the risk? Certain things may make you more likely to get the flu. These include:  Not washing your hands often.  Having close contact with many people during cold and flu season.  Touching your mouth, eyes, or nose without first washing your hands.  Not getting a flu shot every year. You may have a higher risk for the flu, along with serious problems such as a lung infection (pneumonia), if you:  Are older than 65.  Are pregnant.  Have a weakened disease-fighting system (immune system) because of a disease or taking certain medicines.  Have a long-term (chronic) illness, such as: ? Heart, kidney, or lung disease. ? Diabetes. ? Asthma.  Have a liver disorder.  Are very overweight (morbidly obese).  Have anemia. This is a condition that affects your red blood cells. What are the signs or symptoms? Symptoms usually begin suddenly and last 4-14 days. They may include:  Fever and chills.  Headaches, body aches, or muscle aches.  Sore throat.  Cough.  Runny or stuffy (congested) nose.  Chest discomfort.  Not wanting to eat as much as normal (poor appetite).  Weakness or feeling tired (fatigue).  Dizziness.  Feeling sick to your stomach (nauseous) or  throwing up (vomiting). How is this treated? If the flu is found early, you can be treated with medicine that can help reduce how bad the illness is and how long it lasts (antiviral medicine). This may be given by mouth (orally) or through an IV tube. Taking care of yourself at home can help your symptoms get better. Your doctor may suggest:  Taking over-the-counter medicines.  Drinking plenty of fluids. The flu often goes away on its own. If you have very bad symptoms or other problems, you may be treated in a hospital. Follow these instructions at home:     Activity  Rest as needed. Get plenty of sleep.  Stay home from work or school as told by your doctor. ? Do not leave home until you do not have a fever for 24 hours without taking medicine. ? Leave home only to visit your doctor. Eating and drinking  Take an ORS (oral rehydration solution). This is a drink that is sold at pharmacies and stores.  Drink enough fluid to keep your pee (urine) pale yellow.  Drink clear fluids in small amounts as you are able. Clear fluids include: ? Water. ? Ice chips. ? Fruit juice that has water added (diluted fruit juice). ? Low-calorie sports drinks.  Eat bland, easy-to-digest foods in small amounts as you are able. These foods include: ? Bananas. ? Applesauce. ? Rice. ? Lean meats. ? Toast. ? Crackers.  Do not eat or drink: ? Fluids that have a lot   of sugar or caffeine. ? Alcohol. ? Spicy or fatty foods. General instructions  Take over-the-counter and prescription medicines only as told by your doctor.  Use a cool mist humidifier to add moisture to the air in your home. This can make it easier for you to breathe.  Cover your mouth and nose when you cough or sneeze.  Wash your hands with soap and water often, especially after you cough or sneeze. If you cannot use soap and water, use alcohol-based hand sanitizer.  Keep all follow-up visits as told by your doctor. This is  important. How is this prevented?   Get a flu shot every year. You may get the flu shot in late summer, fall, or winter. Ask your doctor when you should get your flu shot.  Avoid contact with people who are sick during fall and winter (cold and flu season). Contact a doctor if:  You get new symptoms.  You have: ? Chest pain. ? Watery poop (diarrhea). ? A fever.  Your cough gets worse.  You start to have more mucus.  You feel sick to your stomach.  You throw up. Get help right away if you:  Have shortness of breath.  Have trouble breathing.  Have skin or nails that turn a bluish color.  Have very bad pain or stiffness in your neck.  Get a sudden headache.  Get sudden pain in your face or ear.  Cannot eat or drink without throwing up. Summary  Influenza ("the flu") is an infection in the lungs, nose, and throat. It is caused by a virus.  Take over-the-counter and prescription medicines only as told by your doctor.  Getting a flu shot every year is the best way to avoid getting the flu. This information is not intended to replace advice given to you by your health care provider. Make sure you discuss any questions you have with your health care provider. Document Released: 08/21/2008 Document Revised: 04/30/2018 Document Reviewed: 04/30/2018 Elsevier Patient Education  [place] injection What is this medicine? NIVOLUMAB (nye VOL ue mab) is a monoclonal antibody. It is used to treat melanoma, lung cancer, kidney cancer, head and neck cancer, Hodgkin lymphoma, urothelial cancer, colon cancer, and liver cancer. This medicine may be used for other purposes; ask your health care provider or pharmacist if you have questions. COMMON BRAND NAME(S): Opdivo What should I tell my health care provider before I take this medicine? They need to know if you have any of these conditions:  diabetes  immune system problems  kidney disease  liver disease   lung disease  organ transplant  stomach or intestine problems  thyroid disease  an unusual or allergic reaction to nivolumab, other medicines, foods, dyes, or preservatives  pregnant or trying to get pregnant  breast-feeding How should I use this medicine? This medicine is for infusion into a vein. It is given by a health care professional in a hospital or clinic setting. A special MedGuide will be given to you before each treatment. Be sure to read this information carefully each time. Talk to your pediatrician regarding the use of this medicine in children. While this drug may be prescribed for children as young as 12 years for selected conditions, precautions do apply. Overdosage: If you think you have taken too much of this medicine contact a poison control center or emergency room at once. NOTE: This medicine is only for you. Do not share this medicine with others. What if I miss a  dose? It is important not to miss your dose. Call your doctor or health care professional if you are unable to keep an appointment. What may interact with this medicine? Interactions have not been studied. Give your health care provider a list of all the medicines, herbs, non-prescription drugs, or dietary supplements you use. Also tell them if you smoke, drink alcohol, or use illegal drugs. Some items may interact with your medicine. This list may not describe all possible interactions. Give your health care provider a list of all the medicines, herbs, non-prescription drugs, or dietary supplements you use. Also tell them if you smoke, drink alcohol, or use illegal drugs. Some items may interact with your medicine. What should I watch for while using this medicine? This drug may make you feel generally unwell. Continue your course of treatment even though you feel ill unless your doctor tells you to stop. You may need blood work done while you are taking this medicine. Do not become pregnant while  taking this medicine or for 5 months after stopping it. Women should inform their doctor if they wish to become pregnant or think they might be pregnant. There is a potential for serious side effects to an unborn child. Talk to your health care professional or pharmacist for more information. Do not breast-feed an infant while taking this medicine or for 5 months after stopping it. What side effects may I notice from receiving this medicine? Side effects that you should report to your doctor or health care professional as soon as possible:  allergic reactions like skin rash, itching or hives, swelling of the face, lips, or tongue  breathing problems  blood in the urine  bloody or watery diarrhea or black, tarry stools  changes in emotions or moods  changes in vision  chest pain  cough  dizziness  feeling faint or lightheaded, falls  fever, chills  headache with fever, neck stiffness, confusion, loss of memory, sensitivity to light, hallucination, loss of contact with reality, or seizures  joint pain  mouth sores  redness, blistering, peeling or loosening of the skin, including inside the mouth  severe muscle pain or weakness  signs and symptoms of high blood sugar such as dizziness; dry mouth; dry skin; fruity breath; nausea; stomach pain; increased hunger or thirst; increased urination  signs and symptoms of kidney injury like trouble passing urine or change in the amount of urine  signs and symptoms of liver injury like dark yellow or brown urine; general ill feeling or flu-like symptoms; light-colored stools; loss of appetite; nausea; right upper belly pain; unusually weak or tired; yellowing of the eyes or skin  swelling of the ankles, feet, hands  trouble passing urine or change in the amount of urine  unusually weak or tired  weight gain or loss Side effects that usually do not require medical attention (report to your doctor or health care professional if they  continue or are bothersome):  bone pain  constipation  decreased appetite  diarrhea  muscle pain  nausea, vomiting  tiredness This list may not describe all possible side effects. Call your doctor for medical advice about side effects. You may report side effects to FDA at 1-800-FDA-1088. Where should I keep my medicine? This drug is given in a hospital or clinic and will not be stored at home. NOTE: This sheet is a summary. It may not cover all possible information. If you have questions about this medicine, talk to your doctor, pharmacist, or health care provider.  2020 Elsevier/Gold Standard (2018-04-02 12:55:04)

## 2019-08-18 ENCOUNTER — Encounter: Payer: Self-pay | Admitting: Family Medicine

## 2019-08-19 ENCOUNTER — Encounter: Payer: Self-pay | Admitting: Family Medicine

## 2019-08-27 ENCOUNTER — Telehealth: Payer: Self-pay | Admitting: *Deleted

## 2019-08-27 NOTE — Telephone Encounter (Signed)
Received call from [employer] on voicemail asking to have his dates of coverage extended for his PET scan since it expires the day before his PET is scheduled.  Call referred to Otilio Carpen our financial counselor

## 2019-09-04 ENCOUNTER — Encounter: Payer: Self-pay | Admitting: Hematology & Oncology

## 2019-09-17 ENCOUNTER — Other Ambulatory Visit: Payer: Self-pay

## 2019-09-17 ENCOUNTER — Ambulatory Visit (HOSPITAL_COMMUNITY)
Admission: RE | Admit: 2019-09-17 | Discharge: 2019-09-17 | Disposition: A | Discharge status: Home / Self Care | Payer: Commercial Managed Care - PPO | Source: Ambulatory Visit | Attending: Hematology & Oncology | Admitting: Hematology & Oncology

## 2019-09-17 DIAGNOSIS — C8195 Hodgkin lymphoma, unspecified, lymph nodes of inguinal region and lower limb: Secondary | ICD-10-CM | POA: Insufficient documentation

## 2019-09-17 LAB — GLUCOSE, CAPILLARY: Glucose-Capillary: 99 mg/dL (ref 70–99)

## 2019-09-17 MED ORDER — FLUDEOXYGLUCOSE F - 18 (FDG) INJECTION
9.3500 | Freq: Once | INTRAVENOUS | Status: AC | PRN
  Administered 2019-09-17: 09:00:00 9.35 via INTRAVENOUS

## 2019-09-18 ENCOUNTER — Telehealth: Payer: Self-pay | Admitting: *Deleted

## 2019-09-18 ENCOUNTER — Ambulatory Visit (HOSPITAL_COMMUNITY): Payer: Commercial Managed Care - PPO

## 2019-09-18 NOTE — Telephone Encounter (Signed)
-----   Message from [name], MD sent at 09/18/2019 10:50 AM EDT ----- Call - NO Hodgkin's disease!!  The prostate is still with an area of activity.  Has he seen a urologist yet??  Laurey Arrow

## 2019-09-18 NOTE — Telephone Encounter (Signed)
Pt notified per order of Dr. Marin Olp that the PET scan showed "no hodgkin's disease!!  The prostate is still with an area of activity."  Pt asked if he has seen a urologist yet, and he stated that he saw Dr. Diona Fanti months ago. Dr. Marin Olp notified.

## 2019-09-21 ENCOUNTER — Telehealth: Payer: Self-pay | Admitting: *Deleted

## 2019-09-21 NOTE — Telephone Encounter (Signed)
Copied from [place] [id number]. Topic: General - Inquiry >> Sep 21, 2019 12:21 PM Virl Axe D wrote: Reason for CRM: Pt requesting CB from Dr. Lorelei Pont regarding PET scan results. Please advise.

## 2019-09-22 ENCOUNTER — Encounter: Payer: Self-pay | Admitting: Family Medicine

## 2019-09-22 NOTE — Telephone Encounter (Signed)
Please advise on scan results.  

## 2019-09-24 ENCOUNTER — Inpatient Hospital Stay: Payer: Commercial Managed Care - PPO

## 2019-09-24 ENCOUNTER — Inpatient Hospital Stay: Payer: Commercial Managed Care - PPO | Attending: Hematology & Oncology | Admitting: Hematology & Oncology

## 2019-09-24 ENCOUNTER — Encounter: Payer: Self-pay | Admitting: Hematology & Oncology

## 2019-09-24 ENCOUNTER — Other Ambulatory Visit: Payer: Self-pay

## 2019-09-24 VITALS — BP 90/74 | HR 100 | Temp 97.5°F | Resp 18

## 2019-09-24 VITALS — BP 126/91 | HR 79 | Temp 97.5°F | Resp 18 | Wt 189.0 lb

## 2019-09-24 DIAGNOSIS — G20 Parkinson's disease: Secondary | ICD-10-CM | POA: Insufficient documentation

## 2019-09-24 DIAGNOSIS — Z79899 Other long term (current) drug therapy: Secondary | ICD-10-CM | POA: Diagnosis not present

## 2019-09-24 DIAGNOSIS — C8195 Hodgkin lymphoma, unspecified, lymph nodes of inguinal region and lower limb: Secondary | ICD-10-CM

## 2019-09-24 DIAGNOSIS — Z9484 Stem cells transplant status: Secondary | ICD-10-CM | POA: Insufficient documentation

## 2019-09-24 DIAGNOSIS — Z923 Personal history of irradiation: Secondary | ICD-10-CM | POA: Diagnosis not present

## 2019-09-24 DIAGNOSIS — Z8571 Personal history of Hodgkin lymphoma: Secondary | ICD-10-CM | POA: Insufficient documentation

## 2019-09-24 LAB — CMP (CANCER CENTER ONLY)
ALT: 5 U/L (ref 0–44)
AST: 16 U/L (ref 15–41)
Albumin: 4.7 g/dL (ref 3.5–5.0)
Alkaline Phosphatase: 68 U/L (ref 38–126)
Anion gap: 8 (ref 5–15)
BUN: 20 mg/dL (ref 6–20)
CO2: 27 mmol/L (ref 22–32)
Calcium: 9.6 mg/dL (ref 8.9–10.3)
Chloride: 105 mmol/L (ref 98–111)
Creatinine: 1.19 mg/dL (ref 0.61–1.24)
GFR, Est AFR Am: >60 mL/min (ref >60)
GFR, Estimated: >60 mL/min (ref >60)
Glucose, Bld: 102 mg/dL — ABNORMAL HIGH (ref 70–99)
Potassium: 4.4 mmol/L (ref 3.5–5.1)
Sodium: 140 mmol/L (ref 135–145)
Total Bilirubin: 0.7 mg/dL (ref 0.3–1.2)
Total Protein: 6.8 g/dL (ref 6.5–8.1)

## 2019-09-24 LAB — LACTATE DEHYDROGENASE: LDH: 189 U/L (ref 98–192)

## 2019-09-24 LAB — CBC WITH DIFFERENTIAL (CANCER CENTER ONLY)
Abs Immature Granulocytes: 0.01 10*3/uL (ref 0.00–0.07)
Basophils Absolute: 0.1 10*3/uL (ref 0.0–0.1)
Basophils Relative: 1 %
Eosinophils Absolute: 0.3 10*3/uL (ref 0.0–0.5)
Eosinophils Relative: 6 %
HCT: 43.9 % (ref 39.0–52.0)
Hemoglobin: 14.5 g/dL (ref 13.0–17.0)
Immature Granulocytes: 0 %
Lymphocytes Relative: 16 %
Lymphs Abs: 0.7 10*3/uL (ref 0.7–4.0)
MCH: 31.9 pg (ref 26.0–34.0)
MCHC: 33 g/dL (ref 30.0–36.0)
MCV: 96.5 fL (ref 80.0–100.0)
Monocytes Absolute: 0.5 10*3/uL (ref 0.1–1.0)
Monocytes Relative: 12 %
Neutro Abs: 2.9 10*3/uL (ref 1.7–7.7)
Neutrophils Relative %: 65 %
Platelet Count: 226 10*3/uL (ref 150–400)
RBC: 4.55 MIL/uL (ref 4.22–5.81)
RDW: 12.6 % (ref 11.5–15.5)
WBC Count: 4.5 10*3/uL (ref 4.0–10.5)
nRBC: 0 % (ref 0.0–0.2)

## 2019-09-24 MED ORDER — SODIUM CHLORIDE 0.9 % IV SOLN
Freq: Once | INTRAVENOUS | Status: AC
  Administered 2019-09-24: 11:00:00 via INTRAVENOUS
  Filled 2019-09-24: qty 250

## 2019-09-24 MED ORDER — SODIUM CHLORIDE 0.9 % IV SOLN
480.0000 mg | Freq: Once | INTRAVENOUS | Status: AC
  Administered 2019-09-24: 480 mg via INTRAVENOUS
  Filled 2019-09-24: qty 48

## 2019-09-24 NOTE — Progress Notes (Signed)
Hematology and Oncology Follow Up Visit  Brian [name] [id number] [birth year] 58 y.o. 09/24/2019   Principle Diagnosis:  Recurrent Hodgkin's Disease -  S/p CAR-T therapy  TIA-resolved  Current Therapy:  Nivolumab q month -- s/p cycle #10       Interim History:  [name] is in for follow-up.  He is little bit irritated today.  I can understand this because of the bills that he has been getting for the [facility].  I told him that this was totally out of our control and that the administration sets the cost of the medications.  As far as his Hodgkin's is concerned, he is doing well with this.  He had a recent PET scan did not show any evidence of recurrent Hodgkin's disease.  We are going to try to move his treatments out now to every 2 months.  I think this would be reasonable and I think also quite effective.  He is exercising.  He is watching what he eats.  He really looks good.  Thankfully, his lady friend is doing a really good job with him.  I am just happy that he seems to be happy.  His Parkinson's does not seem to be doing all that bad right now.  Overall, his performance status is ECOG 0.    Medications:  Current Outpatient Medications:  .  acetaminophen (TYLENOL) 325 MG tablet, Take 650 mg by mouth daily as needed for mild pain. , Disp: , Rfl:  .  carbidopa-levodopa (SINEMET IR) 25-100 MG tablet, TAKE 1 TABLET BY MOUTH THREE TIMES DAILY.TAKE 1 EXTRA EVERY DAY IF NEEDED (Patient taking differently: 2 at 7am/2 at 11am/1 at 4pm), Disp: 360 tablet, Rfl: 1 .  Cholecalciferol (VITAMIN D3) 125 MCG (5000 UT) CAPS, Take 5,000 Units by mouth 4 (four) times a week., Disp: , Rfl:  .  olmesartan (BENICAR) 40 MG tablet, Take 1 tablet (40 mg total) by mouth daily., Disp: 30 tablet, Rfl: 6 .  pramipexole (MIRAPEX) 1 MG tablet, TAKE 1 TABLET(1 MG) BY MOUTH THREE TIMES DAILY, Disp: 270 tablet, Rfl: 1 .  sildenafil (REVATIO) 20 MG tablet, TAKE 1 TO 5 TABLETS BY MOUTH DAILY AS NEEDED PRIOR TO  INTERCOURSE, Disp: 30 tablet, Rfl: 0  Allergies: No Known Allergies  Past Medical History, Surgical history, Social history, and Family History were reviewed and updated.  Review of Systems: Review of Systems  Neurological: Positive for tremors.  All other systems reviewed and are negative.    Physical Exam:  weight is 189 lb (85.7 kg). His temporal temperature is 97.5 F (36.4 C) (abnormal). His blood pressure is 126/91 (abnormal) and his pulse is 79. His respiration is 18 and oxygen saturation is 98%.   Physical Exam Vitals signs reviewed.  HENT:     Head: Normocephalic and atraumatic.  Eyes:     Pupils: Pupils are equal, round, and reactive to light.  Neck:     Musculoskeletal: Normal range of motion.  Cardiovascular:     Rate and Rhythm: Normal rate and regular rhythm.     Heart sounds: Normal heart sounds.  Pulmonary:     Effort: Pulmonary effort is normal.     Breath sounds: Normal breath sounds.  Abdominal:     General: Bowel sounds are normal.     Palpations: Abdomen is soft.  Musculoskeletal: Normal range of motion.        General: No tenderness or deformity.  Lymphadenopathy:     Cervical: No cervical adenopathy.  Skin:    General: Skin is warm and dry.     Findings: No erythema or rash.  Neurological:     Mental Status: He is alert and oriented to person, place, and time.  Psychiatric:        Behavior: Behavior normal.        Thought Content: Thought content normal.        Judgment: Judgment normal.     Lab Results  Component Value Date   WBC 4.5 09/24/2019   HGB 14.5 09/24/2019   HCT 43.9 09/24/2019   MCV 96.5 09/24/2019   PLT 226 09/24/2019     Chemistry      Component Value Date/Time   NA 140 09/24/2019 0909   NA 142 04/02/2017   NA 140 07/19/2016 1305   K 4.4 09/24/2019 0909   K 4.4 07/19/2016 1305   CL 105 09/24/2019 0909   CL 105 02/08/2016 1117   CL 107 03/25/2013 0828   CO2 27 09/24/2019 0909   CO2 26 07/19/2016 1305   BUN 20  09/24/2019 0909   BUN 21 04/02/2017   BUN 22.8 07/19/2016 1305   CREATININE 1.19 09/24/2019 0909   CREATININE 1.1 07/19/2016 1305   GLU 105 04/02/2017      Component Value Date/Time   CALCIUM 9.6 09/24/2019 0909   CALCIUM 9.8 07/19/2016 1305   ALKPHOS 68 09/24/2019 0909   ALKPHOS 76 07/19/2016 1305   AST 16 09/24/2019 0909   AST 18 07/19/2016 1305   ALT 5 09/24/2019 0909   ALT 14 07/19/2016 1305   BILITOT 0.7 09/24/2019 0909   BILITOT 0.70 07/19/2016 1305         Impression and Plan: Brian [name] is a 58 year old gentleman.  He has history of recurrent Hodgkin's disease. 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 He then had a recurrence after this.  He did undergo CAR-T therapy at [facility].  He did well with this.  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  This was proven by biopsy.  He had radiation therapy for this.  Again, we will move his appointments out to every 2 months now.  Hopefully if everything looks good with his next PET scan, which will be in 6 months, I will then move his treatments out every 3 months.  I would think that with immunotherapy, we should be able to get a long-term response in remission.Marland Kitchen       [name], MD 10/29/202010:18 AM

## 2019-09-24 NOTE — Patient Instructions (Signed)
Nivolumab injection What is this medicine? NIVOLUMAB (nye VOL ue mab) is a monoclonal antibody. It is used to treat melanoma, lung cancer, kidney cancer, head and neck cancer, Hodgkin lymphoma, urothelial cancer, colon cancer, and liver cancer. This medicine may be used for other purposes; ask your health care provider or pharmacist if you have questions. COMMON BRAND NAME(S): Opdivo What should I tell my health care provider before I take this medicine? They need to know if you have any of these conditions:  diabetes  immune system problems  kidney disease  liver disease  lung disease  organ transplant  stomach or intestine problems  thyroid disease  an unusual or allergic reaction to nivolumab, other medicines, foods, dyes, or preservatives  pregnant or trying to get pregnant  breast-feeding How should I use this medicine? This medicine is for infusion into a vein. It is given by a health care professional in a hospital or clinic setting. A special MedGuide will be given to you before each treatment. Be sure to read this information carefully each time. Talk to your pediatrician regarding the use of this medicine in children. While this drug may be prescribed for children as young as 12 years for selected conditions, precautions do apply. Overdosage: If you think you have taken too much of this medicine contact a poison control center or emergency room at once. NOTE: This medicine is only for you. Do not share this medicine with others. What if I miss a dose? It is important not to miss your dose. Call your doctor or health care professional if you are unable to keep an appointment. What may interact with this medicine? Interactions have not been studied. Give your health care provider a list of all the medicines, herbs, non-prescription drugs, or dietary supplements you use. Also tell them if you smoke, drink alcohol, or use illegal drugs. Some items may interact with your  medicine. This list may not describe all possible interactions. Give your health care provider a list of all the medicines, herbs, non-prescription drugs, or dietary supplements you use. Also tell them if you smoke, drink alcohol, or use illegal drugs. Some items may interact with your medicine. What should I watch for while using this medicine? This drug may make you feel generally unwell. Continue your course of treatment even though you feel ill unless your doctor tells you to stop. You may need blood work done while you are taking this medicine. Do not become pregnant while taking this medicine or for 5 months after stopping it. Women should inform their doctor if they wish to become pregnant or think they might be pregnant. There is a potential for serious side effects to an unborn child. Talk to your health care professional or pharmacist for more information. Do not breast-feed an infant while taking this medicine or for 5 months after stopping it. What side effects may I notice from receiving this medicine? Side effects that you should report to your doctor or health care professional as soon as possible:  allergic reactions like skin rash, itching or hives, swelling of the face, lips, or tongue  breathing problems  blood in the urine  bloody or watery diarrhea or black, tarry stools  changes in emotions or moods  changes in vision  chest pain  cough  dizziness  feeling faint or lightheaded, falls  fever, chills  headache with fever, neck stiffness, confusion, loss of memory, sensitivity to light, hallucination, loss of contact with reality, or seizures  joint   pain  mouth sores  redness, blistering, peeling or loosening of the skin, including inside the mouth  severe muscle pain or weakness  signs and symptoms of high blood sugar such as dizziness; dry mouth; dry skin; fruity breath; nausea; stomach pain; increased hunger or thirst; increased urination  signs and  symptoms of kidney injury like trouble passing urine or change in the amount of urine  signs and symptoms of liver injury like dark yellow or brown urine; general ill feeling or flu-like symptoms; light-colored stools; loss of appetite; nausea; right upper belly pain; unusually weak or tired; yellowing of the eyes or skin  swelling of the ankles, feet, hands  trouble passing urine or change in the amount of urine  unusually weak or tired  weight gain or loss Side effects that usually do not require medical attention (report to your doctor or health care professional if they continue or are bothersome):  bone pain  constipation  decreased appetite  diarrhea  muscle pain  nausea, vomiting  tiredness This list may not describe all possible side effects. Call your doctor for medical advice about side effects. You may report side effects to FDA at 1-800-FDA-1088. Where should I keep my medicine? This drug is given in a hospital or clinic and will not be stored at home. NOTE: This sheet is a summary. It may not cover all possible information. If you have questions about this medicine, talk to your doctor, pharmacist, or health care provider.  2020 [employer] (2018-04-02 12:55:04)  

## 2019-11-02 ENCOUNTER — Other Ambulatory Visit: Payer: Self-pay

## 2019-11-02 MED ORDER — CARBIDOPA-LEVODOPA 25-100 MG PO TABS: ORAL_TABLET | ORAL | 1 refills | Status: DC

## 2019-11-23 ENCOUNTER — Inpatient Hospital Stay: Payer: Commercial Managed Care - PPO

## 2019-11-23 ENCOUNTER — Telehealth: Payer: Self-pay | Admitting: Hematology & Oncology

## 2019-11-23 ENCOUNTER — Encounter: Payer: Self-pay | Admitting: Hematology & Oncology

## 2019-11-23 ENCOUNTER — Other Ambulatory Visit: Payer: Self-pay

## 2019-11-23 ENCOUNTER — Inpatient Hospital Stay: Payer: Commercial Managed Care - PPO | Attending: Hematology & Oncology | Admitting: Hematology & Oncology

## 2019-11-23 VITALS — BP 116/89 | HR 79 | Temp 97.6°F | Resp 18 | Wt 191.2 lb

## 2019-11-23 DIAGNOSIS — C8195 Hodgkin lymphoma, unspecified, lymph nodes of inguinal region and lower limb: Secondary | ICD-10-CM

## 2019-11-23 DIAGNOSIS — G20 Parkinson's disease: Secondary | ICD-10-CM | POA: Diagnosis not present

## 2019-11-23 DIAGNOSIS — Z8571 Personal history of Hodgkin lymphoma: Secondary | ICD-10-CM | POA: Diagnosis present

## 2019-11-23 DIAGNOSIS — Z923 Personal history of irradiation: Secondary | ICD-10-CM | POA: Diagnosis not present

## 2019-11-23 DIAGNOSIS — Z9484 Stem cells transplant status: Secondary | ICD-10-CM | POA: Diagnosis not present

## 2019-11-23 DIAGNOSIS — Z79899 Other long term (current) drug therapy: Secondary | ICD-10-CM | POA: Diagnosis not present

## 2019-11-23 LAB — CBC WITH DIFFERENTIAL (CANCER CENTER ONLY)
Abs Immature Granulocytes: 0.02 10*3/uL (ref 0.00–0.07)
Basophils Absolute: 0.1 10*3/uL (ref 0.0–0.1)
Basophils Relative: 1 %
Eosinophils Absolute: 0.3 10*3/uL (ref 0.0–0.5)
Eosinophils Relative: 7 %
HCT: 40.4 % (ref 39.0–52.0)
Hemoglobin: 13.4 g/dL (ref 13.0–17.0)
Immature Granulocytes: 0 %
Lymphocytes Relative: 16 %
Lymphs Abs: 0.8 10*3/uL (ref 0.7–4.0)
MCH: 31.9 pg (ref 26.0–34.0)
MCHC: 33.2 g/dL (ref 30.0–36.0)
MCV: 96.2 fL (ref 80.0–100.0)
Monocytes Absolute: 0.6 10*3/uL (ref 0.1–1.0)
Monocytes Relative: 14 %
Neutro Abs: 2.8 10*3/uL (ref 1.7–7.7)
Neutrophils Relative %: 62 %
Platelet Count: 225 10*3/uL (ref 150–400)
RBC: 4.2 MIL/uL — ABNORMAL LOW (ref 4.22–5.81)
RDW: 13.1 % (ref 11.5–15.5)
WBC Count: 4.6 10*3/uL (ref 4.0–10.5)
nRBC: 0 % (ref 0.0–0.2)

## 2019-11-23 LAB — CMP (CANCER CENTER ONLY)
ALT: 4 U/L (ref 0–44)
AST: 18 U/L (ref 15–41)
Albumin: 4.3 g/dL (ref 3.5–5.0)
Alkaline Phosphatase: 68 U/L (ref 38–126)
Anion gap: 7 (ref 5–15)
BUN: 30 mg/dL — ABNORMAL HIGH (ref 6–20)
CO2: 27 mmol/L (ref 22–32)
Calcium: 9.4 mg/dL (ref 8.9–10.3)
Chloride: 105 mmol/L (ref 98–111)
Creatinine: 1.2 mg/dL (ref 0.61–1.24)
GFR, Est AFR Am: >60 mL/min (ref >60)
GFR, Estimated: >60 mL/min (ref >60)
Glucose, Bld: 100 mg/dL — ABNORMAL HIGH (ref 70–99)
Potassium: 4.2 mmol/L (ref 3.5–5.1)
Sodium: 139 mmol/L (ref 135–145)
Total Bilirubin: 0.6 mg/dL (ref 0.3–1.2)
Total Protein: 6.6 g/dL (ref 6.5–8.1)

## 2019-11-23 LAB — LACTATE DEHYDROGENASE: LDH: 209 U/L — ABNORMAL HIGH (ref 98–192)

## 2019-11-23 MED ORDER — SODIUM CHLORIDE 0.9 % IV SOLN
480.0000 mg | Freq: Once | INTRAVENOUS | Status: AC
  Administered 2019-11-23: 11:00:00 480 mg via INTRAVENOUS
  Filled 2019-11-23: qty 48

## 2019-11-23 MED ORDER — SODIUM CHLORIDE 0.9 % IV SOLN
Freq: Once | INTRAVENOUS | Status: AC
  Filled 2019-11-23: qty 250

## 2019-11-23 NOTE — Progress Notes (Signed)
Hematology and Oncology Follow Up Visit  Brian [name] [id number] Sep 08, 1961 58 y.o. 11/23/2019   Principle Diagnosis:  Recurrent Hodgkin's Disease -  S/p CAR-T therapy  TIA-resolved  Current Therapy:  Nivolumab q month -- s/p cycle #12       Interim History:  [name] is in for follow-up.  He is doing pretty well.  He really has had no specific complaint since we last saw him.  He is eating okay.  He is working out.  He is still working.  He really does not like to work at home.  He has had no problems with nausea or vomiting.  He has had no change in bowel or bladder habits.  He is on Parkinson medications.  The Parkinson's seem to be doing fairly well.  He has had no fever.  He has had no rashes.  He has had no leg swelling.  Overall, his performance status is ECOG 0.    Medications:  Current Outpatient Medications:  .  acetaminophen (TYLENOL) 325 MG tablet, Take 650 mg by mouth daily as needed for mild pain. , Disp: , Rfl:  .  carbidopa-levodopa (SINEMET IR) 25-100 MG tablet, 2 at 7am/2 at 11am/1 at 4pm, Disp: 450 tablet, Rfl: 1 .  Cholecalciferol (VITAMIN D3) 125 MCG (5000 UT) CAPS, Take 5,000 Units by mouth 4 (four) times a week., Disp: , Rfl:  .  olmesartan (BENICAR) 40 MG tablet, Take 1 tablet (40 mg total) by mouth daily., Disp: 30 tablet, Rfl: 6 .  pramipexole (MIRAPEX) 1 MG tablet, TAKE 1 TABLET(1 MG) BY MOUTH THREE TIMES DAILY, Disp: 270 tablet, Rfl: 1 .  sildenafil (REVATIO) 20 MG tablet, TAKE 1 TO 5 TABLETS BY MOUTH DAILY AS NEEDED PRIOR TO INTERCOURSE, Disp: 30 tablet, Rfl: 0  Allergies: No Known Allergies  Past Medical History, Surgical history, Social history, and Family History were reviewed and updated.  Review of Systems: Review of Systems  Neurological: Positive for tremors.  All other systems reviewed and are negative.    Physical Exam:  weight is 191 lb 4 oz (86.8 kg). His temporal temperature is 97.6 F (36.4 C). His blood pressure is 116/89 and  his pulse is 79. His respiration is 18 and oxygen saturation is 100%.   Physical Exam Vitals reviewed.  HENT:     Head: Normocephalic and atraumatic.  Eyes:     Pupils: Pupils are equal, round, and reactive to light.  Cardiovascular:     Rate and Rhythm: Normal rate and regular rhythm.     Heart sounds: Normal heart sounds.  Pulmonary:     Effort: Pulmonary effort is normal.     Breath sounds: Normal breath sounds.  Abdominal:     General: Bowel sounds are normal.     Palpations: Abdomen is soft.  Musculoskeletal:        General: No tenderness or deformity. Normal range of motion.     Cervical back: Normal range of motion.  Lymphadenopathy:     Cervical: No cervical adenopathy.  Skin:    General: Skin is warm and dry.     Findings: No erythema or rash.  Neurological:     Mental Status: He is alert and oriented to person, place, and time.  Psychiatric:        Behavior: Behavior normal.        Thought Content: Thought content normal.        Judgment: Judgment normal.     Lab Results  Component  Value Date   WBC 4.6 11/23/2019   HGB 13.4 11/23/2019   HCT 40.4 11/23/2019   MCV 96.2 11/23/2019   PLT 225 11/23/2019     Chemistry      Component Value Date/Time   NA 139 11/23/2019 0908   NA 142 04/02/2017 0000   NA 140 07/19/2016 1305   K 4.2 11/23/2019 0908   K 4.4 07/19/2016 1305   CL 105 11/23/2019 0908   CL 105 02/08/2016 1117   CL 107 03/25/2013 0828   CO2 27 11/23/2019 0908   CO2 26 07/19/2016 1305   BUN 30 (H) 11/23/2019 0908   BUN 21 04/02/2017 0000   BUN 22.8 07/19/2016 1305   CREATININE 1.20 11/23/2019 0908   CREATININE 1.1 07/19/2016 1305   GLU 105 04/02/2017 0000      Component Value Date/Time   CALCIUM 9.4 11/23/2019 0908   CALCIUM 9.8 07/19/2016 1305   ALKPHOS 68 11/23/2019 0908   ALKPHOS 76 07/19/2016 1305   AST 18 11/23/2019 0908   AST 18 07/19/2016 1305   ALT 4 11/23/2019 0908   ALT 14 07/19/2016 1305   BILITOT 0.6 11/23/2019 0908    BILITOT 0.70 07/19/2016 1305         Impression and Plan: Brian [name] is a 58 year old gentleman.  He has history of recurrent Hodgkin's disease. 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 He then had a recurrence after this.  He did undergo CAR-T therapy at [facility].  He did well with this.  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  This was proven by biopsy.  He had radiation therapy for this.  From my perspective, everything looks fine.  I do not see any evidence of recurrent Hodgkin's disease.  The Opdivo is clearly helping [place] out.  We will plan to get her back in 2 more months.    [name], MD 12/28/202010:13 AM

## 2019-11-23 NOTE — Patient Instructions (Signed)
[facility] Discharge Instructions for Patients Receiving Chemotherapy  Today you received the following chemotherapy agents:  Nivolumab  To help prevent nausea and vomiting after your treatment, we encourage you to take your nausea medication as prescribed.   If you develop nausea and vomiting that is not controlled by your nausea medication, call the clinic.   BELOW ARE SYMPTOMS THAT SHOULD BE REPORTED IMMEDIATELY:  *FEVER GREATER THAN 100.5 F  *CHILLS WITH OR WITHOUT FEVER  NAUSEA AND VOMITING THAT IS NOT CONTROLLED WITH YOUR NAUSEA MEDICATION  *UNUSUAL SHORTNESS OF BREATH  *UNUSUAL BRUISING OR BLEEDING  TENDERNESS IN MOUTH AND THROAT WITH OR WITHOUT PRESENCE OF ULCERS  *URINARY PROBLEMS  *BOWEL PROBLEMS  UNUSUAL RASH Items with * indicate a potential emergency and should be followed up as soon as possible.  Feel free to call the clinic should you have any questions or concerns. The clinic phone number is [phone].  Please show the CHEMO ALERT CARD at check-in to the Emergency Department and triage nurse.   

## 2019-11-23 NOTE — Telephone Encounter (Signed)
Appointments scheduled calendar will be printed by infusion/ secure chat sent/ per 12/28 los

## 2019-11-27 HISTORY — PX: COLONOSCOPY: SHX174

## 2019-12-16 ENCOUNTER — Other Ambulatory Visit: Payer: Self-pay

## 2019-12-16 ENCOUNTER — Encounter: Payer: Self-pay | Admitting: Neurology

## 2019-12-16 ENCOUNTER — Telehealth (INDEPENDENT_AMBULATORY_CARE_PROVIDER_SITE_OTHER): Payer: Commercial Managed Care - PPO | Admitting: Neurology

## 2019-12-16 ENCOUNTER — Ambulatory Visit: Payer: Commercial Managed Care - PPO | Admitting: Neurology

## 2019-12-16 DIAGNOSIS — G20A1 Parkinson's disease without dyskinesia, without mention of fluctuations: Secondary | ICD-10-CM

## 2019-12-16 DIAGNOSIS — J188 Other pneumonia, unspecified organism: Secondary | ICD-10-CM

## 2019-12-16 DIAGNOSIS — G20 Parkinson's disease: Secondary | ICD-10-CM

## 2019-12-16 DIAGNOSIS — K5900 Constipation, unspecified: Secondary | ICD-10-CM

## 2019-12-16 DIAGNOSIS — G510 Bell's palsy: Secondary | ICD-10-CM

## 2019-12-16 DIAGNOSIS — C8190 Hodgkin lymphoma, unspecified, unspecified site: Secondary | ICD-10-CM

## 2019-12-16 DIAGNOSIS — K5901 Slow transit constipation: Secondary | ICD-10-CM

## 2019-12-16 DIAGNOSIS — R42 Dizziness and giddiness: Secondary | ICD-10-CM

## 2019-12-16 NOTE — Progress Notes (Signed)
Virtual Visit via Video Note The purpose of this virtual visit is to provide medical care while limiting exposure to the novel coronavirus.    Consent was obtained for video visit:  Yes.   Answered questions that patient had about telehealth interaction:  Yes.   I discussed the limitations, risks, security and privacy concerns of performing an evaluation and management service by telemedicine. I also discussed with the patient that there may be a patient responsible charge related to this service. The patient expressed understanding and agreed to proceed.  Pt location: Home Physician Location: office Name of referring provider:  Copland, [name], MD I connected with Brian [name] at patients initiation/request on 12/16/2019 at  9:15 AM EST by video enabled telemedicine application and verified that I am speaking with the correct person using two identifiers. Pt MRN:  [id number] Pt DOB:  1960-12-17 Video Participants:  Brian [name];     History of Present Illness:  Pt seen in f/u for Parkinsons Disease. Pt feels about same as last visit.   Pt denies falls.  Pt has occasional dizziness every few days.  Is on benicar x a few months.  Prior to that BP was running in the 140-150's.  BP now in the 120-130's.  No hallucinations.  Mood has been good.  My previous records as well as any outside records made available were reviewed prior to todays visit.  Saw Dr. Marin Olp on 12/28.    Current movement d/o meds:  pramipexole, 1 mg tid carbidopa/levodopa 25/100, 2/2/1 (increased last visit)   Current Outpatient Medications on File Prior to Visit  Medication Sig Dispense Refill  . acetaminophen (TYLENOL) 325 MG tablet Take 650 mg by mouth daily as needed for mild pain.     . carbidopa-levodopa (SINEMET IR) 25-100 MG tablet 2 at 7am/2 at 11am/1 at 4pm 450 tablet 1  . Cholecalciferol (VITAMIN D3) 125 MCG (5000 UT) CAPS Take 5,000 Units by mouth 4 (four) times a week.    . olmesartan (BENICAR) 40 MG  tablet Take 1 tablet (40 mg total) by mouth daily. 30 tablet 6  . pramipexole (MIRAPEX) 1 MG tablet TAKE 1 TABLET(1 MG) BY MOUTH THREE TIMES DAILY 270 tablet 1  . sildenafil (REVATIO) 20 MG tablet TAKE 1 TO 5 TABLETS BY MOUTH DAILY AS NEEDED PRIOR TO INTERCOURSE 30 tablet 0   No current facility-administered medications on file prior to visit.     Observations/Objective:   There were no vitals filed for this visit. GEN:  The patient appears stated age and is in NAD.  Neurological examination:  Orientation: The patient is alert and oriented x3. Cranial nerves:  There is nofacial hypomimia.  The speech is fluent and clear. Soft palate rises symmetrically and there is no tongue deviation. Hearing is intact to conversational tone. Motor: Strength is at least antigravity x 4.   Shoulder shrug is equal and symmetric.  There is no pronator drift.  Movement examination: Tone: unable Abnormal movements: none seen but pt was walking throughout the exam and never really at rest Coordination:  There is no decremation with RAM's, with any form of RAMS, including alternating supination and pronation of the forearm, hand opening and closing, finger taps Gait and Station:  The patient's stride length is good.      Assessment and Plan:   1.  Parkinsons Disease  -continue pramipexole, 1 mg tid  -continue carbidopa/levodopa 2/2/1  -discussed rotigotine b/c of evening wearing off but he decided to  keep meds as is for now.   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  Hodgkins lymphoma  -following with onc 5.  Dizziness  -recently started on antihypertensive  -will need to watch BP closely  -increase water intake  -discussed that Parkinsons Disease lowers BP with time 6.  Constipation  -managing with diet and occasional stool softener  -increase water  Follow Up Instructions:  5 months  -I discussed the assessment and treatment  plan with the patient. The patient was provided an opportunity to ask questions and all were answered. The patient agreed with the plan and demonstrated an understanding of the instructions.   The patient was advised to call back or seek an in-person evaluation if the symptoms worsen or if the condition fails to improve as anticipated.    Total time spent on today's visit was 30 minutes, including both face-to-face time and nonface-to-face time.  Time included that spent on review of records (prior notes available to me/labs/imaging if pertinent), discussing treatment and goals, answering patient's questions and coordinating care.   Alonza Bogus, DO

## 2019-12-28 ENCOUNTER — Encounter: Payer: Self-pay | Admitting: Hematology & Oncology

## 2020-01-03 ENCOUNTER — Encounter: Payer: Self-pay | Admitting: Hematology & Oncology

## 2020-01-06 ENCOUNTER — Other Ambulatory Visit: Payer: Self-pay

## 2020-01-06 MED ORDER — PRAMIPEXOLE DIHYDROCHLORIDE 1 MG PO TABS: ORAL_TABLET | ORAL | 1 refills | Status: DC

## 2020-01-15 ENCOUNTER — Encounter: Payer: Self-pay | Admitting: Hematology & Oncology

## 2020-01-15 ENCOUNTER — Other Ambulatory Visit: Payer: Self-pay | Admitting: *Deleted

## 2020-01-19 ENCOUNTER — Other Ambulatory Visit: Payer: Self-pay | Admitting: Pharmacist

## 2020-01-19 DIAGNOSIS — E032 Hypothyroidism due to medicaments and other exogenous substances: Secondary | ICD-10-CM

## 2020-01-25 ENCOUNTER — Other Ambulatory Visit: Payer: Self-pay

## 2020-01-25 ENCOUNTER — Encounter: Payer: Self-pay | Admitting: *Deleted

## 2020-01-25 ENCOUNTER — Inpatient Hospital Stay: Payer: Commercial Managed Care - PPO

## 2020-01-25 ENCOUNTER — Inpatient Hospital Stay (HOSPITAL_BASED_OUTPATIENT_CLINIC_OR_DEPARTMENT_OTHER): Payer: Commercial Managed Care - PPO | Admitting: Hematology & Oncology

## 2020-01-25 ENCOUNTER — Encounter: Payer: Self-pay | Admitting: Hematology & Oncology

## 2020-01-25 ENCOUNTER — Inpatient Hospital Stay: Payer: Commercial Managed Care - PPO | Attending: Hematology & Oncology

## 2020-01-25 VITALS — BP 125/80 | HR 71 | Temp 97.1°F | Resp 16 | Wt 191.0 lb

## 2020-01-25 DIAGNOSIS — Z9484 Stem cells transplant status: Secondary | ICD-10-CM | POA: Insufficient documentation

## 2020-01-25 DIAGNOSIS — E032 Hypothyroidism due to medicaments and other exogenous substances: Secondary | ICD-10-CM

## 2020-01-25 DIAGNOSIS — C8195 Hodgkin lymphoma, unspecified, lymph nodes of inguinal region and lower limb: Secondary | ICD-10-CM

## 2020-01-25 DIAGNOSIS — Z8571 Personal history of Hodgkin lymphoma: Secondary | ICD-10-CM | POA: Insufficient documentation

## 2020-01-25 DIAGNOSIS — Z923 Personal history of irradiation: Secondary | ICD-10-CM | POA: Insufficient documentation

## 2020-01-25 LAB — CMP (CANCER CENTER ONLY)
ALT: 4 U/L (ref 0–44)
AST: 18 U/L (ref 15–41)
Albumin: 4.5 g/dL (ref 3.5–5.0)
Alkaline Phosphatase: 61 U/L (ref 38–126)
Anion gap: 7 (ref 5–15)
BUN: 32 mg/dL — ABNORMAL HIGH (ref 6–20)
CO2: 26 mmol/L (ref 22–32)
Calcium: 9.7 mg/dL (ref 8.9–10.3)
Chloride: 107 mmol/L (ref 98–111)
Creatinine: 1.24 mg/dL (ref 0.61–1.24)
GFR, Est AFR Am: >60 mL/min (ref >60)
GFR, Estimated: >60 mL/min (ref >60)
Glucose, Bld: 105 mg/dL — ABNORMAL HIGH (ref 70–99)
Potassium: 4.2 mmol/L (ref 3.5–5.1)
Sodium: 140 mmol/L (ref 135–145)
Total Bilirubin: 0.6 mg/dL (ref 0.3–1.2)
Total Protein: 6.7 g/dL (ref 6.5–8.1)

## 2020-01-25 LAB — CBC WITH DIFFERENTIAL (CANCER CENTER ONLY)
Abs Immature Granulocytes: 0.02 10*3/uL (ref 0.00–0.07)
Basophils Absolute: 0 10*3/uL (ref 0.0–0.1)
Basophils Relative: 1 %
Eosinophils Absolute: 0.5 10*3/uL (ref 0.0–0.5)
Eosinophils Relative: 11 %
HCT: 40.8 % (ref 39.0–52.0)
Hemoglobin: 13.4 g/dL (ref 13.0–17.0)
Immature Granulocytes: 1 %
Lymphocytes Relative: 16 %
Lymphs Abs: 0.7 10*3/uL (ref 0.7–4.0)
MCH: 32 pg (ref 26.0–34.0)
MCHC: 32.8 g/dL (ref 30.0–36.0)
MCV: 97.4 fL (ref 80.0–100.0)
Monocytes Absolute: 0.5 10*3/uL (ref 0.1–1.0)
Monocytes Relative: 12 %
Neutro Abs: 2.6 10*3/uL (ref 1.7–7.7)
Neutrophils Relative %: 59 %
Platelet Count: 213 10*3/uL (ref 150–400)
RBC: 4.19 MIL/uL — ABNORMAL LOW (ref 4.22–5.81)
RDW: 12.8 % (ref 11.5–15.5)
WBC Count: 4.4 10*3/uL (ref 4.0–10.5)
nRBC: 0 % (ref 0.0–0.2)

## 2020-01-25 LAB — TSH: TSH: 4.505 u[IU]/mL — ABNORMAL HIGH (ref 0.320–4.118)

## 2020-01-25 LAB — LACTATE DEHYDROGENASE: LDH: 201 U/L — ABNORMAL HIGH (ref 98–192)

## 2020-01-25 MED ORDER — SODIUM CHLORIDE 0.9 % IV SOLN
480.0000 mg | Freq: Once | INTRAVENOUS | Status: AC
  Administered 2020-01-25: 480 mg via INTRAVENOUS
  Filled 2020-01-25: qty 48

## 2020-01-25 MED ORDER — SODIUM CHLORIDE 0.9 % IV SOLN
Freq: Once | INTRAVENOUS | Status: AC
  Filled 2020-01-25: qty 250

## 2020-01-25 NOTE — Patient Instructions (Signed)
[facility] Discharge Instructions for Patients Receiving Chemotherapy  Today you received the following chemotherapy agents:  Nivolumab  To help prevent nausea and vomiting after your treatment, we encourage you to take your nausea medication as prescribed.   If you develop nausea and vomiting that is not controlled by your nausea medication, call the clinic.   BELOW ARE SYMPTOMS THAT SHOULD BE REPORTED IMMEDIATELY:  *FEVER GREATER THAN 100.5 F  *CHILLS WITH OR WITHOUT FEVER  NAUSEA AND VOMITING THAT IS NOT CONTROLLED WITH YOUR NAUSEA MEDICATION  *UNUSUAL SHORTNESS OF BREATH  *UNUSUAL BRUISING OR BLEEDING  TENDERNESS IN MOUTH AND THROAT WITH OR WITHOUT PRESENCE OF ULCERS  *URINARY PROBLEMS  *BOWEL PROBLEMS  UNUSUAL RASH Items with * indicate a potential emergency and should be followed up as soon as possible.  Feel free to call the clinic should you have any questions or concerns. The clinic phone number is [phone].  Please show the CHEMO ALERT CARD at check-in to the Emergency Department and triage nurse.   

## 2020-01-25 NOTE — Addendum Note (Signed)
Addended by: Burney Gauze R on: 01/25/2020 10:34 AM   Modules accepted: Orders

## 2020-01-25 NOTE — Progress Notes (Signed)
Hematology and Oncology Follow Up Visit  MACIE SCHAER [id number] Sep 14, 1961 59 y.o. 01/25/2020   Principle Diagnosis:  Recurrent Hodgkin's Disease -  S/p CAR-T therapy  TIA-resolved  Current Therapy:  Nivolumab q 2 month -- s/p cycle #12       Interim History:  Mr.  Trindle is in for follow-up.  Things seems to be going pretty well with him right now.  He is still working from home.  He is thankful that he is working.  He is exercising quite a bit.  He probably goes to the gym 4 times a day which is wonderful.  He is still awaiting the coronavirus vaccine.  I told him that he really does not need to move up to a "higher tier" right now.  He has had no problems with cough or shortness of breath.  Has had no nausea or vomiting.  He has had no rashes.  Says that he is also here on his thighs.  This might be from the immunotherapy.  He has had no problems with bleeding.  There is no change in bowel or bladder habits.  He has had no nausea or vomiting.  He has had no headache.  Overall, his performance status is ECOG 0.    Medications:  Current Outpatient Medications:  .  acetaminophen (TYLENOL) 325 MG tablet, Take 650 mg by mouth daily as needed for mild pain. , Disp: , Rfl:  .  carbidopa-levodopa (SINEMET IR) 25-100 MG tablet, 2 at 7am/2 at 11am/1 at 4pm, Disp: 450 tablet, Rfl: 1 .  Cholecalciferol (VITAMIN D3) 125 MCG (5000 UT) CAPS, Take 5,000 Units by mouth 4 (four) times a week., Disp: , Rfl:  .  olmesartan (BENICAR) 40 MG tablet, Take 1 tablet (40 mg total) by mouth daily., Disp: 30 tablet, Rfl: 6 .  pramipexole (MIRAPEX) 1 MG tablet, TAKE 1 TABLET(1 MG) BY MOUTH THREE TIMES DAILY, Disp: 270 tablet, Rfl: 1 .  sildenafil (REVATIO) 20 MG tablet, TAKE 1 TO 5 TABLETS BY MOUTH DAILY AS NEEDED PRIOR TO INTERCOURSE, Disp: 30 tablet, Rfl: 0  Allergies: No Known Allergies  Past Medical History, Surgical history, Social history, and Family History were reviewed and updated.  Review of  Systems: Review of Systems  Neurological: Positive for tremors.  All other systems reviewed and are negative.    Physical Exam:  weight is 191 lb (86.6 kg). His temporal temperature is 97.1 F (36.2 C) (abnormal). His blood pressure is 125/80 and his pulse is 71. His respiration is 16 and oxygen saturation is 100%.   Physical Exam Vitals reviewed.  HENT:     Head: Normocephalic and atraumatic.  Eyes:     Pupils: Pupils are equal, round, and reactive to light.  Cardiovascular:     Rate and Rhythm: Normal rate and regular rhythm.     Heart sounds: Normal heart sounds.  Pulmonary:     Effort: Pulmonary effort is normal.     Breath sounds: Normal breath sounds.  Abdominal:     General: Bowel sounds are normal.     Palpations: Abdomen is soft.  Musculoskeletal:        General: No tenderness or deformity. Normal range of motion.     Cervical back: Normal range of motion.  Lymphadenopathy:     Cervical: No cervical adenopathy.  Skin:    General: Skin is warm and dry.     Findings: No erythema or rash.  Neurological:     Mental Status: He is  alert and oriented to person, place, and time.  Psychiatric:        Behavior: Behavior normal.        Thought Content: Thought content normal.        Judgment: Judgment normal.     Lab Results  Component Value Date   WBC 4.4 01/25/2020   HGB 13.4 01/25/2020   HCT 40.8 01/25/2020   MCV 97.4 01/25/2020   PLT 213 01/25/2020     Chemistry      Component Value Date/Time   NA 140 01/25/2020 0901   NA 142 04/02/2017 0000   NA 140 07/19/2016 1305   K 4.2 01/25/2020 0901   K 4.4 07/19/2016 1305   CL 107 01/25/2020 0901   CL 105 02/08/2016 1117   CL 107 03/25/2013 0828   CO2 26 01/25/2020 0901   CO2 26 07/19/2016 1305   BUN 32 (H) 01/25/2020 0901   BUN 21 04/02/2017 0000   BUN 22.8 07/19/2016 1305   CREATININE 1.24 01/25/2020 0901   CREATININE 1.1 07/19/2016 1305   GLU 105 04/02/2017 0000      Component Value Date/Time    CALCIUM 9.7 01/25/2020 0901   CALCIUM 9.8 07/19/2016 1305   ALKPHOS 61 01/25/2020 0901   ALKPHOS 76 07/19/2016 1305   AST 18 01/25/2020 0901   AST 18 07/19/2016 1305   ALT 4 01/25/2020 0901   ALT 14 07/19/2016 1305   BILITOT 0.6 01/25/2020 0901   BILITOT 0.70 07/19/2016 1305         Impression and Plan: Mr. Overdorf is a 59 year old gentleman.  He has history of recurrent Hodgkin's disease. 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 He then had a recurrence after this.  He did undergo CAR[facility]T therapy at [facility].  He did well with this.  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  This was proven by biopsy.  He had radiation therapy for this.  From my perspective, everything looks fine.  I do not see any evidence of recurrent Hodgkin's disease.  The Opdivo is clearly helping [place] out.  I will plan for another PET scan to be done in a month or so.  We will plan to get her back in 2 more months.    [name], MD 3/1/202110:29 AM

## 2020-01-26 ENCOUNTER — Other Ambulatory Visit: Payer: Self-pay | Admitting: *Deleted

## 2020-01-28 ENCOUNTER — Encounter: Payer: Self-pay | Admitting: Family Medicine

## 2020-01-28 DIAGNOSIS — Z1211 Encounter for screening for malignant neoplasm of colon: Secondary | ICD-10-CM

## 2020-01-28 NOTE — Addendum Note (Signed)
Addended by: Lamar Blinks C on: 01/28/2020 12:27 PM   Modules accepted: Orders

## 2020-02-16 ENCOUNTER — Encounter: Payer: Self-pay | Admitting: Hematology & Oncology

## 2020-02-16 ENCOUNTER — Encounter: Payer: Self-pay | Admitting: Family Medicine

## 2020-02-20 ENCOUNTER — Ambulatory Visit: Payer: Commercial Managed Care - PPO | Attending: Internal Medicine

## 2020-02-20 DIAGNOSIS — Z23 Encounter for immunization: Secondary | ICD-10-CM

## 2020-02-20 NOTE — Progress Notes (Signed)
   Covid-19 Vaccination Clinic  Name:  Brian [name]    MRN: [id number] DOB: 06-15-1961  02/20/2020  [name] was observed post Covid-19 immunization for 15 minutes without incident. He was provided with Vaccine Information Sheet and instruction to access the V-Safe system.   [name] was instructed to call 911 with any severe reactions post vaccine: Marland Kitchen Difficulty breathing  . Swelling of face and throat  . A fast heartbeat  . A bad rash all over body  . Dizziness and weakness   Immunizations Administered    Name Date Dose VIS Date Route   Pfizer COVID-19 Vaccine 02/20/2020  9:54 AM 0.3 mL 11/06/2019 Intramuscular   Manufacturer: [place]   Lot: [id number]   [place]: [id number]

## 2020-02-25 ENCOUNTER — Ambulatory Visit (HOSPITAL_COMMUNITY): Payer: Commercial Managed Care - PPO

## 2020-02-26 ENCOUNTER — Other Ambulatory Visit: Payer: Self-pay

## 2020-02-26 ENCOUNTER — Encounter: Payer: Self-pay | Admitting: *Deleted

## 2020-02-26 ENCOUNTER — Encounter (HOSPITAL_COMMUNITY)
Admission: RE | Admit: 2020-02-26 | Discharge: 2020-02-26 | Disposition: A | Discharge status: Home / Self Care | Payer: Commercial Managed Care - PPO | Source: Ambulatory Visit | Attending: Hematology & Oncology | Admitting: Hematology & Oncology

## 2020-02-26 DIAGNOSIS — C8195 Hodgkin lymphoma, unspecified, lymph nodes of inguinal region and lower limb: Secondary | ICD-10-CM | POA: Diagnosis not present

## 2020-02-26 LAB — GLUCOSE, CAPILLARY: Glucose-Capillary: 97 mg/dL (ref 70–99)

## 2020-02-26 MED ORDER — FLUDEOXYGLUCOSE F - 18 (FDG) INJECTION
9.3300 | Freq: Once | INTRAVENOUS | Status: AC | PRN
  Administered 2020-02-26: 08:00:00 9.33 via INTRAVENOUS

## 2020-03-01 ENCOUNTER — Other Ambulatory Visit: Payer: Self-pay | Admitting: Hematology & Oncology

## 2020-03-16 ENCOUNTER — Ambulatory Visit: Payer: Commercial Managed Care - PPO | Attending: Internal Medicine

## 2020-03-16 ENCOUNTER — Encounter: Payer: Self-pay | Admitting: Hematology & Oncology

## 2020-03-16 ENCOUNTER — Encounter: Payer: Self-pay | Admitting: Family Medicine

## 2020-03-16 DIAGNOSIS — Z23 Encounter for immunization: Secondary | ICD-10-CM

## 2020-03-16 NOTE — Progress Notes (Signed)
   Covid-19 Vaccination Clinic  Name:  Brian [name]    MRN: [id number] DOB: September 16, 1961  03/16/2020  Brian [name] was observed post Covid-19 immunization for 15 minutes without incident. He was provided with Vaccine Information Sheet and instruction to access the V-Safe system.   Brian [name] was instructed to call 911 with any severe reactions post vaccine: Marland Kitchen Difficulty breathing  . Swelling of face and throat  . A fast heartbeat  . A bad rash all over body  . Dizziness and weakness   Immunizations Administered    Name Date Dose VIS Date Route   Pfizer COVID-19 Vaccine 03/16/2020  8:39 AM 0.3 mL 01/20/2019 Intramuscular   Manufacturer: [place]   Lot: [id number]   [place]: [id number]

## 2020-03-18 NOTE — Progress Notes (Signed)
Pharmacist Chemotherapy Monitoring - Follow Up Assessment    I verify that I have reviewed each item in the below checklist:  . Regimen for the patient is scheduled for the appropriate day and plan matches scheduled date. Marland Kitchen Appropriate non-routine labs are ordered dependent on drug ordered. . If applicable, additional medications reviewed and ordered per protocol based on lifetime cumulative doses and/or treatment regimen.   Plan for follow-up and/or issues identified: No . I-vent associated with next due treatment: No . MD and/or nursing notified: No  Davis Vannatter, Jacqlyn Larsen 03/18/2020 8:08 AM

## 2020-03-25 ENCOUNTER — Inpatient Hospital Stay: Payer: Commercial Managed Care - PPO

## 2020-03-25 ENCOUNTER — Other Ambulatory Visit: Payer: Self-pay

## 2020-03-25 ENCOUNTER — Inpatient Hospital Stay: Payer: Commercial Managed Care - PPO | Attending: Hematology & Oncology | Admitting: Hematology & Oncology

## 2020-03-25 ENCOUNTER — Encounter: Payer: Self-pay | Admitting: Hematology & Oncology

## 2020-03-25 VITALS — BP 125/96 | HR 83 | Temp 97.5°F | Resp 20 | Wt 189.0 lb

## 2020-03-25 DIAGNOSIS — Z8571 Personal history of Hodgkin lymphoma: Secondary | ICD-10-CM | POA: Diagnosis not present

## 2020-03-25 DIAGNOSIS — Z9484 Stem cells transplant status: Secondary | ICD-10-CM | POA: Diagnosis not present

## 2020-03-25 DIAGNOSIS — C8195 Hodgkin lymphoma, unspecified, lymph nodes of inguinal region and lower limb: Secondary | ICD-10-CM

## 2020-03-25 DIAGNOSIS — Z923 Personal history of irradiation: Secondary | ICD-10-CM | POA: Insufficient documentation

## 2020-03-25 LAB — CBC WITH DIFFERENTIAL (CANCER CENTER ONLY)
Abs Immature Granulocytes: 0.02 10*3/uL (ref 0.00–0.07)
Basophils Absolute: 0.1 10*3/uL (ref 0.0–0.1)
Basophils Relative: 2 %
Eosinophils Absolute: 0.2 10*3/uL (ref 0.0–0.5)
Eosinophils Relative: 4 %
HCT: 40.8 % (ref 39.0–52.0)
Hemoglobin: 13.4 g/dL (ref 13.0–17.0)
Immature Granulocytes: 0 %
Lymphocytes Relative: 15 %
Lymphs Abs: 0.7 10*3/uL (ref 0.7–4.0)
MCH: 32 pg (ref 26.0–34.0)
MCHC: 32.8 g/dL (ref 30.0–36.0)
MCV: 97.4 fL (ref 80.0–100.0)
Monocytes Absolute: 0.6 10*3/uL (ref 0.1–1.0)
Monocytes Relative: 12 %
Neutro Abs: 3.2 10*3/uL (ref 1.7–7.7)
Neutrophils Relative %: 67 %
Platelet Count: 224 10*3/uL (ref 150–400)
RBC: 4.19 MIL/uL — ABNORMAL LOW (ref 4.22–5.81)
RDW: 12.3 % (ref 11.5–15.5)
WBC Count: 4.8 10*3/uL (ref 4.0–10.5)
nRBC: 0 % (ref 0.0–0.2)

## 2020-03-25 LAB — CMP (CANCER CENTER ONLY)
ALT: 5 U/L (ref 0–44)
AST: 17 U/L (ref 15–41)
Albumin: 4.4 g/dL (ref 3.5–5.0)
Alkaline Phosphatase: 56 U/L (ref 38–126)
Anion gap: 7 (ref 5–15)
BUN: 35 mg/dL — ABNORMAL HIGH (ref 6–20)
CO2: 25 mmol/L (ref 22–32)
Calcium: 9.3 mg/dL (ref 8.9–10.3)
Chloride: 105 mmol/L (ref 98–111)
Creatinine: 1.43 mg/dL — ABNORMAL HIGH (ref 0.61–1.24)
GFR, Est AFR Am: >60 mL/min (ref >60)
GFR, Estimated: 53 mL/min — ABNORMAL LOW (ref >60)
Glucose, Bld: 100 mg/dL — ABNORMAL HIGH (ref 70–99)
Potassium: 4.7 mmol/L (ref 3.5–5.1)
Sodium: 137 mmol/L (ref 135–145)
Total Bilirubin: 0.7 mg/dL (ref 0.3–1.2)
Total Protein: 6.7 g/dL (ref 6.5–8.1)

## 2020-03-25 LAB — LACTATE DEHYDROGENASE: LDH: 199 U/L — ABNORMAL HIGH (ref 98–192)

## 2020-03-25 LAB — TSH: TSH: 3.726 u[IU]/mL (ref 0.320–4.118)

## 2020-03-25 MED ORDER — SODIUM CHLORIDE 0.9 % IV SOLN
480.0000 mg | Freq: Once | INTRAVENOUS | Status: AC
  Administered 2020-03-25: 12:00:00 480 mg via INTRAVENOUS
  Filled 2020-03-25: qty 48

## 2020-03-25 MED ORDER — SODIUM CHLORIDE 0.9 % IV SOLN
Freq: Once | INTRAVENOUS | Status: AC
  Filled 2020-03-25: qty 250

## 2020-03-25 NOTE — Progress Notes (Signed)
Hematology and Oncology Follow Up Visit  Brian [name] [id number] 05-14-1961 59 y.o. 03/25/2020   Principle Diagnosis:  Recurrent Hodgkin's Disease -  S/p CAR-T therapy  TIA-resolved  Current Therapy:  Nivolumab q 2 month -- s/p cycle #14       Interim History:  [name] is in for follow-up.  Overall, he seems to be doing pretty well.  We last saw him a couple months ago.  He has had his coronavirus vaccines which is nice to see.  He is holding his weight.  Is exercising.  Is try to lose a little bit of weight.  This will certainly help with his blood pressure.  He has had no problems with fever.  He has had no cough or shortness of breath.  He has had no nausea or vomiting.  He has had no bleeding.  There has been no leg swelling.  He has had no rashes.  He had a PET scan on February 26, 2020.  The PET scan did not show any evidence of recurrent Hodgkin's disease.  There was some activity in the prostate which he has had previously.  He wants to try to move his nivolumab treatments out to every 3 months.  We probably will be able to do this in the future.  He is little bit worried as his insurance turns over in July.  He wants to make sure that his next treatment is given before July 1.    It seems like the Parkinson's is doing a little bit better.  I do not really detect as much tremors.  He seems to be walking more steadily.  Overall, his performance status is ECOG 0.    Medications:  Current Outpatient Medications:  .  acetaminophen (TYLENOL) 325 MG tablet, Take 650 mg by mouth daily as needed for mild pain. , Disp: , Rfl:  .  carbidopa-levodopa (SINEMET IR) 25-100 MG tablet, 2 at 7am/2 at 11am/1 at 4pm, Disp: 450 tablet, Rfl: 1 .  Cholecalciferol (VITAMIN D3) 125 MCG (5000 UT) CAPS, Take 5,000 Units by mouth 4 (four) times a week., Disp: , Rfl:  .  olmesartan (BENICAR) 40 MG tablet, TAKE 1 TABLET(40 MG) BY MOUTH DAILY, Disp: 30 tablet, Rfl: 6 .  pramipexole (MIRAPEX) 1 MG  tablet, TAKE 1 TABLET(1 MG) BY MOUTH THREE TIMES DAILY, Disp: 270 tablet, Rfl: 1 .  sildenafil (REVATIO) 20 MG tablet, TAKE 1 TO 5 TABLETS BY MOUTH DAILY AS NEEDED PRIOR TO INTERCOURSE, Disp: 30 tablet, Rfl: 0  Allergies: No Known Allergies  Past Medical History, Surgical history, Social history, and Family History were reviewed and updated.  Review of Systems: Review of Systems  Neurological: Positive for tremors.  All other systems reviewed and are negative.    Physical Exam:  weight is 189 lb (85.7 kg). His temporal temperature is 97.5 F (36.4 C) (abnormal). His blood pressure is 125/96 (abnormal) and his pulse is 83. His respiration is 20 and oxygen saturation is 100%.   Physical Exam Vitals reviewed.  HENT:     Head: Normocephalic and atraumatic.  Eyes:     Pupils: Pupils are equal, round, and reactive to light.  Cardiovascular:     Rate and Rhythm: Normal rate and regular rhythm.     Heart sounds: Normal heart sounds.  Pulmonary:     Effort: Pulmonary effort is normal.     Breath sounds: Normal breath sounds.  Abdominal:     General: Bowel sounds are normal.  Palpations: Abdomen is soft.  Musculoskeletal:        General: No tenderness or deformity. Normal range of motion.     Cervical back: Normal range of motion.  Lymphadenopathy:     Cervical: No cervical adenopathy.  Skin:    General: Skin is warm and dry.     Findings: No erythema or rash.  Neurological:     Mental Status: He is alert and oriented to person, place, and time.  Psychiatric:        Behavior: Behavior normal.        Thought Content: Thought content normal.        Judgment: Judgment normal.     Lab Results  Component Value Date   WBC 4.8 03/25/2020   HGB 13.4 03/25/2020   HCT 40.8 03/25/2020   MCV 97.4 03/25/2020   PLT 224 03/25/2020     Chemistry      Component Value Date/Time   NA 137 03/25/2020 0949   NA 142 04/02/2017 0000   NA 140 07/19/2016 1305   K 4.7 03/25/2020 0949    K 4.4 07/19/2016 1305   CL 105 03/25/2020 0949   CL 105 02/08/2016 1117   CL 107 03/25/2013 0828   CO2 25 03/25/2020 0949   CO2 26 07/19/2016 1305   BUN 35 (H) 03/25/2020 0949   BUN 21 04/02/2017 0000   BUN 22.8 07/19/2016 1305   CREATININE 1.43 (H) 03/25/2020 0949   CREATININE 1.1 07/19/2016 1305   GLU 105 04/02/2017 0000      Component Value Date/Time   CALCIUM 9.3 03/25/2020 0949   CALCIUM 9.8 07/19/2016 1305   ALKPHOS 56 03/25/2020 0949   ALKPHOS 76 07/19/2016 1305   AST 17 03/25/2020 0949   AST 18 07/19/2016 1305   ALT 5 03/25/2020 0949   ALT 14 07/19/2016 1305   BILITOT 0.7 03/25/2020 0949   BILITOT 0.70 07/19/2016 1305         Impression and Plan: Brian [name] is a 59 year old gentleman.  He has history of recurrent Hodgkin's disease. 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 He then had a recurrence after this.  He did undergo [facility] therapy at [facility].  He did well with this.  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  This was proven by biopsy.  He had radiation therapy for this.  From my perspective, everything looks fine.  I do not see any evidence of recurrent Hodgkin's disease.  The Opdivo is clearly helping [place] out.  We will plan to get her back in 2 more months.    [name], MD 4/30/202110:41 AM

## 2020-04-13 ENCOUNTER — Telehealth: Payer: Self-pay | Admitting: Family Medicine

## 2020-04-13 ENCOUNTER — Encounter: Payer: Self-pay | Admitting: Family Medicine

## 2020-04-13 NOTE — Telephone Encounter (Signed)
fyi

## 2020-04-13 NOTE — Telephone Encounter (Signed)
Caller : Brain Call Back # [phone]  Patient states that he had Colonoscopy this morning with [facility]. Patient just wanted to make provider aware.

## 2020-05-04 ENCOUNTER — Other Ambulatory Visit: Payer: Self-pay

## 2020-05-04 ENCOUNTER — Telehealth: Payer: Self-pay | Admitting: Neurology

## 2020-05-04 ENCOUNTER — Telehealth: Payer: Self-pay

## 2020-05-04 MED ORDER — CARBIDOPA-LEVODOPA 25-100 MG PO TABS: ORAL_TABLET | ORAL | 1 refills | Status: DC

## 2020-05-04 NOTE — Telephone Encounter (Signed)
Patient called in to see if Dr. Lorelei Pont would accept his son as a new patient. Please advise.

## 2020-05-04 NOTE — Telephone Encounter (Signed)
Patient called and said the pharmacy does not have his refills for carbidopa-levodopa 25-100. He said he was told by the pharmacy that this office did not respond to the pharmacy requests.  [facility] on [address] in [place].

## 2020-05-04 NOTE — Telephone Encounter (Signed)
Sent refill request in at 1:22.

## 2020-05-04 NOTE — Telephone Encounter (Signed)
Can you get sons info so she can review chart please?

## 2020-05-10 ENCOUNTER — Encounter: Payer: Self-pay | Admitting: Family Medicine

## 2020-05-11 NOTE — Telephone Encounter (Signed)
Not sure if we are allowed to put in referrals prior to seeing him?

## 2020-05-16 NOTE — Progress Notes (Signed)
Assessment/Plan:   1.  Parkinsons Disease  -Continue carbidopa/levodopa 25/100, 2 tablets in the morning, 2 in the afternoon and 1 in the evening  -Continue pramipexole, 1 mg 3 times per day  -We will try to add carbidopa/levodopa 50/200 at bedtime to see if that helps any of the arthralgias/myalgias in the morning. Suspect that it is not going to help and that this is really perhaps related to other issues (questionable degenerative changes). If the bedtime levodopa does not help, he will need to follow-up with his primary care physician regarding that.  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  Patient has symptomatic reinnervation of the right face.  3.  Hodgkin's lymphoma  -Doing well from that standpoint.  Following with oncology.  4.  Renal insufficiency  -Creatinine steadily been rising over the last year.  No need to alter pramipexole dosing at this point, but it is something we will need to monitor.  Sent message to Dr. Marin Olp.  5. Lightheadedness/dizziness  -Wonder if getting some orthostasis. Discussed concept of Neurogenic Orthostatic Hypotension in Parkinson's disease. He is on Benicar. Wonder if that dosage could be reduced or eliminated. We will send note to oncology who prescribes this.  -Discussed importance of hydration.   Subjective:   Brian [name] was seen today in follow up for Parkinsons disease.  My previous records were reviewed prior to todays visit as well as outside records available to me. Pt denies falls.  Pt is having lightheadedness.  No hallucinations.  Mood has been good.  Oncology records from April 30 are reviewed.  Patient was doing well from their standpoint.  He had no evidence of active recurrent disease.  He states that first thing in the AM, he feels terrible - painful, sore - shoulders, back, legs.  Its a generalized arthralgias/myalgias as if one slept on the ground.  He has changed mattressess.  He is fine one hour into the day. Is  exercising without any trouble.  Current prescribed movement disorder medications: carbidopa/levodopa 25/100, 2 tablets in the morning, 2 in the afternoon and 1 in the evening pramipexole, 1 mg 3 times per day   PREVIOUS MEDICATIONS: pramipexole; levodopa; (have discussed rotigotine because of wearing off but he decided to keep medication as is)  ALLERGIES:  No Known Allergies  CURRENT MEDICATIONS:  Outpatient Encounter Medications as of 05/18/2020  Medication Sig  . carbidopa-levodopa (SINEMET IR) 25-100 MG tablet 2 at 7am/2 at 11am/1 at 4pm  . Cholecalciferol (VITAMIN D3) 125 MCG (5000 UT) CAPS Take 5,000 Units by mouth 4 (four) times a week.  . olmesartan (BENICAR) 40 MG tablet TAKE 1 TABLET(40 MG) BY MOUTH DAILY  . pramipexole (MIRAPEX) 1 MG tablet TAKE 1 TABLET(1 MG) BY MOUTH THREE TIMES DAILY  . sildenafil (REVATIO) 20 MG tablet TAKE 1 TO 5 TABLETS BY MOUTH DAILY AS NEEDED PRIOR TO INTERCOURSE  . acetaminophen (TYLENOL) 325 MG tablet Take 650 mg by mouth daily as needed for mild pain.    No facility-administered encounter medications on file as of 05/18/2020.    Objective:   PHYSICAL EXAMINATION:    VITALS:   Vitals:   05/18/20 1123  BP: 126/81  Pulse: 77  Resp: 18  SpO2: 95%  Weight: 188 lb (85.3 kg)  Height: 5\' 10"  (1.778 m)    GEN:  The patient appears stated age and is in NAD. HEENT:  Normocephalic, atraumatic.  The mucous membranes are moist. The superficial temporal arteries are  without ropiness or tenderness. CV:  RRR Lungs:  CTAB Neck/HEME:  There are no carotid bruits bilaterally.  Neurological examination:  Orientation: The patient is alert and oriented x3. Cranial nerves: There is facial hypomimia. He has some asymmetry in the face because of history of Bell's palsy. He has a symmetric blink. The speech is fluent and clear. Soft palate rises symmetrically and there is no tongue deviation. Hearing is intact to conversational tone. Sensation: Sensation is  intact to light touch throughout Motor: Strength is at least antigravity x4.  Movement examination: Tone: There is mild increased tone in the RUE Abnormal movements: there is mild R>LLE dyskinesia Coordination:  There is no decremation with RAM's Gait and Station: The patient has no difficulty arising out of a deep-seated chair without the use of the hands. The patient's stride length is good, but when he initially starts out he will walk on his toe on the left. He is able to run really well down the hall.    I have reviewed and interpreted the following labs independently    Chemistry      Component Value Date/Time   NA 137 03/25/2020 0949   NA 142 04/02/2017 0000   NA 140 07/19/2016 1305   K 4.7 03/25/2020 0949   K 4.4 07/19/2016 1305   CL 105 03/25/2020 0949   CL 105 02/08/2016 1117   CL 107 03/25/2013 0828   CO2 25 03/25/2020 0949   CO2 26 07/19/2016 1305   BUN 35 (H) 03/25/2020 0949   BUN 21 04/02/2017 0000   BUN 22.8 07/19/2016 1305   CREATININE 1.43 (H) 03/25/2020 0949   CREATININE 1.1 07/19/2016 1305   GLU 105 04/02/2017 0000      Component Value Date/Time   CALCIUM 9.3 03/25/2020 0949   CALCIUM 9.8 07/19/2016 1305   ALKPHOS 56 03/25/2020 0949   ALKPHOS 76 07/19/2016 1305   AST 17 03/25/2020 0949   AST 18 07/19/2016 1305   ALT 5 03/25/2020 0949   ALT 14 07/19/2016 1305   BILITOT 0.7 03/25/2020 0949   BILITOT 0.70 07/19/2016 1305       Lab Results  Component Value Date   WBC 4.8 03/25/2020   HGB 13.4 03/25/2020   HCT 40.8 03/25/2020   MCV 97.4 03/25/2020   PLT 224 03/25/2020    Lab Results  Component Value Date   TSH 3.726 03/25/2020     Total time spent on today's visit was 30 minutes, including both face-to-face time and nonface-to-face time.  Time included that spent on review of records (prior notes available to me/labs/imaging if pertinent), discussing treatment and goals, answering patient's questions and coordinating care.  Cc:  Copland,  [name], MD

## 2020-05-18 ENCOUNTER — Ambulatory Visit (INDEPENDENT_AMBULATORY_CARE_PROVIDER_SITE_OTHER): Payer: Commercial Managed Care - PPO | Admitting: Neurology

## 2020-05-18 ENCOUNTER — Encounter: Payer: Self-pay | Admitting: Neurology

## 2020-05-18 ENCOUNTER — Other Ambulatory Visit: Payer: Self-pay

## 2020-05-18 VITALS — BP 126/81 | HR 77 | Resp 18 | Ht 70.0 in | Wt 188.0 lb

## 2020-05-18 DIAGNOSIS — G20 Parkinson's disease: Secondary | ICD-10-CM

## 2020-05-18 DIAGNOSIS — R42 Dizziness and giddiness: Secondary | ICD-10-CM | POA: Diagnosis not present

## 2020-05-18 DIAGNOSIS — N289 Disorder of kidney and ureter, unspecified: Secondary | ICD-10-CM

## 2020-05-18 DIAGNOSIS — G20A1 Parkinson's disease without dyskinesia, without mention of fluctuations: Secondary | ICD-10-CM

## 2020-05-18 MED ORDER — CARBIDOPA-LEVODOPA ER 50-200 MG PO TBCR
1.0000 | EXTENDED_RELEASE_TABLET | Freq: Every day | ORAL | 0 refills | Status: DC
Start: 2020-05-18 — End: 2020-08-25

## 2020-05-18 NOTE — Patient Instructions (Signed)
Add carbidopa/levodopa 50/200 at bedtime.  The physicians and staff at [facility] are committed to providing excellent care. You may receive a survey requesting feedback about your experience at our office. We strive to receive "very good" responses to the survey questions. If you feel that your experience would prevent you from giving the office a "very good " response, please contact our office to try to remedy the situation. We may be reached at [phone]. Thank you for taking the time out of your busy day to complete the survey.

## 2020-05-24 ENCOUNTER — Telehealth: Payer: Self-pay | Admitting: Radiation Oncology

## 2020-05-24 NOTE — Telephone Encounter (Signed)
Brian [name] wanted the radiation tx dates  10/18/17 - 11/05/17 as he can not see them on MyChart. He also wanted to know tx area which was showing to be the T1 spine area.

## 2020-05-25 ENCOUNTER — Telehealth: Payer: Self-pay | Admitting: Hematology & Oncology

## 2020-05-25 ENCOUNTER — Encounter: Payer: Self-pay | Admitting: Hematology & Oncology

## 2020-05-25 ENCOUNTER — Inpatient Hospital Stay: Payer: Commercial Managed Care - PPO

## 2020-05-25 ENCOUNTER — Other Ambulatory Visit: Payer: Self-pay

## 2020-05-25 ENCOUNTER — Inpatient Hospital Stay: Payer: Commercial Managed Care - PPO | Attending: Hematology & Oncology | Admitting: Hematology & Oncology

## 2020-05-25 VITALS — BP 114/80 | HR 76 | Temp 98.4°F | Resp 18 | Wt 188.0 lb

## 2020-05-25 DIAGNOSIS — Z9484 Stem cells transplant status: Secondary | ICD-10-CM | POA: Insufficient documentation

## 2020-05-25 DIAGNOSIS — Z8571 Personal history of Hodgkin lymphoma: Secondary | ICD-10-CM | POA: Insufficient documentation

## 2020-05-25 DIAGNOSIS — C8195 Hodgkin lymphoma, unspecified, lymph nodes of inguinal region and lower limb: Secondary | ICD-10-CM

## 2020-05-25 DIAGNOSIS — Z923 Personal history of irradiation: Secondary | ICD-10-CM | POA: Insufficient documentation

## 2020-05-25 DIAGNOSIS — Z5111 Encounter for antineoplastic chemotherapy: Secondary | ICD-10-CM | POA: Diagnosis not present

## 2020-05-25 DIAGNOSIS — Z79899 Other long term (current) drug therapy: Secondary | ICD-10-CM | POA: Insufficient documentation

## 2020-05-25 LAB — CMP (CANCER CENTER ONLY)
ALT: 3 U/L (ref 0–44)
AST: 16 U/L (ref 15–41)
Albumin: 4.5 g/dL (ref 3.5–5.0)
Alkaline Phosphatase: 60 U/L (ref 38–126)
Anion gap: 7 (ref 5–15)
BUN: 35 mg/dL — ABNORMAL HIGH (ref 6–20)
CO2: 26 mmol/L (ref 22–32)
Calcium: 9.3 mg/dL (ref 8.9–10.3)
Chloride: 107 mmol/L (ref 98–111)
Creatinine: 1.36 mg/dL — ABNORMAL HIGH (ref 0.61–1.24)
GFR, Est AFR Am: >60 mL/min (ref >60)
GFR, Estimated: 57 mL/min — ABNORMAL LOW (ref >60)
Glucose, Bld: 105 mg/dL — ABNORMAL HIGH (ref 70–99)
Potassium: 4.5 mmol/L (ref 3.5–5.1)
Sodium: 140 mmol/L (ref 135–145)
Total Bilirubin: 0.7 mg/dL (ref 0.3–1.2)
Total Protein: 6.9 g/dL (ref 6.5–8.1)

## 2020-05-25 LAB — CBC WITH DIFFERENTIAL (CANCER CENTER ONLY)
Abs Immature Granulocytes: 0.02 10*3/uL (ref 0.00–0.07)
Basophils Absolute: 0.1 10*3/uL (ref 0.0–0.1)
Basophils Relative: 1 %
Eosinophils Absolute: 0.3 10*3/uL (ref 0.0–0.5)
Eosinophils Relative: 6 %
HCT: 40.5 % (ref 39.0–52.0)
Hemoglobin: 13.3 g/dL (ref 13.0–17.0)
Immature Granulocytes: 0 %
Lymphocytes Relative: 16 %
Lymphs Abs: 0.8 10*3/uL (ref 0.7–4.0)
MCH: 32.2 pg (ref 26.0–34.0)
MCHC: 32.8 g/dL (ref 30.0–36.0)
MCV: 98.1 fL (ref 80.0–100.0)
Monocytes Absolute: 0.7 10*3/uL (ref 0.1–1.0)
Monocytes Relative: 14 %
Neutro Abs: 3.1 10*3/uL (ref 1.7–7.7)
Neutrophils Relative %: 63 %
Platelet Count: 221 10*3/uL (ref 150–400)
RBC: 4.13 MIL/uL — ABNORMAL LOW (ref 4.22–5.81)
RDW: 12.9 % (ref 11.5–15.5)
WBC Count: 4.9 10*3/uL (ref 4.0–10.5)
nRBC: 0 % (ref 0.0–0.2)

## 2020-05-25 LAB — LACTATE DEHYDROGENASE: LDH: 196 U/L — ABNORMAL HIGH (ref 98–192)

## 2020-05-25 MED ORDER — SODIUM CHLORIDE 0.9 % IV SOLN
480.0000 mg | Freq: Once | INTRAVENOUS | Status: AC
  Administered 2020-05-25: 480 mg via INTRAVENOUS
  Filled 2020-05-25: qty 48

## 2020-05-25 MED ORDER — SODIUM CHLORIDE 0.9 % IV SOLN
Freq: Once | INTRAVENOUS | Status: AC
  Filled 2020-05-25: qty 250

## 2020-05-25 NOTE — Progress Notes (Signed)
Hematology and Oncology Follow Up Visit  Brian [name] [id number] Dec 26, 1960 59 y.o. 05/25/2020   Principle Diagnosis:  Recurrent Hodgkin's Disease -  S/p CAR-T therapy  TIA-resolved  Current Therapy:  Nivolumab q 2 month -- s/p cycle #14       Interim History:  [name] is in for follow-up.  So far, Brian [name] has had a nice summer.  Brian [name] took his 2 boys down to [place], [place].  Brian [name] has a brother lives down there.  They really had a good time down there.  Brian [name] is working.  Brian [name] is still working out.  Brian [name] is still seeing the same woman.  She obviously is very good to him.  Brian [name] is very lucky to have found someone so nice.  Brian [name] is pretty stable with the Parkinson's.  Brian [name] is followed by Dr. Carles Collet for this.  She has not had to adjust his medications recently.  Brian [name] has had no fever.  Brian [name] has had no cough.  There has been no nausea or vomiting.  Brian [name] has had no change in bowel or bladder habits.  There has been some problems with achiness and stiffness in the morning.  Brian [name] says it takes about 20 minutes before Brian [name] can finally move around okay.  It sounds like Brian [name] may have an element of inflammatory arthritis.  I really do not think Brian [name] needs to see rheumatology.  This seems to improve on its own with activity.  I told him that this could certainly could be a long-term effect from the CAR-T treatment.    Brian [name] wonders when Brian [name] can stop the nivolumab.  I told of there is really no information about that.  I told him if Brian [name] wanted to call his transplant team at [facility] they may be able to provide some guidance with that.  Overall, his performance status is ECOG 0.    Medications:  Current Outpatient Medications:  .  ASPIRIN 81 PO, , Disp: , Rfl:  .  carbidopa-levodopa (SINEMET CR) 50-200 MG tablet, Take 1 tablet by mouth at bedtime., Disp: 30 tablet, Rfl: 0 .  carbidopa-levodopa (SINEMET IR) 25-100 MG tablet, 2 at 7am/2 at 11am/1 at 4pm, Disp: 450 tablet, Rfl: 1 .  Cholecalciferol (VITAMIN D3) 125 MCG (5000 UT)  CAPS, Take 5,000 Units by mouth 4 (four) times a week., Disp: , Rfl:  .  olmesartan (BENICAR) 40 MG tablet, TAKE 1 TABLET(40 MG) BY MOUTH DAILY, Disp: 30 tablet, Rfl: 6 .  pramipexole (MIRAPEX) 1 MG tablet, TAKE 1 TABLET(1 MG) BY MOUTH THREE TIMES DAILY, Disp: 270 tablet, Rfl: 1 .  sildenafil (REVATIO) 20 MG tablet, TAKE 1 TO 5 TABLETS BY MOUTH DAILY AS NEEDED PRIOR TO INTERCOURSE, Disp: 30 tablet, Rfl: 0  Allergies: No Known Allergies  Past Medical History, Surgical history, Social history, and Family History were reviewed and updated.  Review of Systems: Review of Systems  Neurological: Positive for tremors.  All other systems reviewed and are negative.    Physical Exam:  weight is 188 lb (85.3 kg). His oral temperature is 98.4 F (36.9 C). His blood pressure is 114/80 and his pulse is 76. His respiration is 18 and oxygen saturation is 99%.   Physical Exam Vitals reviewed.  HENT:     Head: Normocephalic and atraumatic.  Eyes:     Pupils: Pupils are equal, round, and reactive to light.  Cardiovascular:     Rate and Rhythm: Normal rate and regular rhythm.     Heart  sounds: Normal heart sounds.  Pulmonary:     Effort: Pulmonary effort is normal.     Breath sounds: Normal breath sounds.  Abdominal:     General: Bowel sounds are normal.     Palpations: Abdomen is soft.  Musculoskeletal:        General: No tenderness or deformity. Normal range of motion.     Cervical back: Normal range of motion.  Lymphadenopathy:     Cervical: No cervical adenopathy.  Skin:    General: Skin is warm and dry.     Findings: No erythema or rash.  Neurological:     Mental Status: Brian [name] is alert and oriented to person, place, and time.  Psychiatric:        Behavior: Behavior normal.        Thought Content: Thought content normal.        Judgment: Judgment normal.     Lab Results  Component Value Date   WBC 4.9 05/25/2020   HGB 13.3 05/25/2020   HCT 40.5 05/25/2020   MCV 98.1 05/25/2020    PLT 221 05/25/2020     Chemistry      Component Value Date/Time   NA 137 03/25/2020 0949   NA 142 04/02/2017 0000   NA 140 07/19/2016 1305   K 4.7 03/25/2020 0949   K 4.4 07/19/2016 1305   CL 105 03/25/2020 0949   CL 105 02/08/2016 1117   CL 107 03/25/2013 0828   CO2 25 03/25/2020 0949   CO2 26 07/19/2016 1305   BUN 35 (H) 03/25/2020 0949   BUN 21 04/02/2017 0000   BUN 22.8 07/19/2016 1305   CREATININE 1.43 (H) 03/25/2020 0949   CREATININE 1.1 07/19/2016 1305   GLU 105 04/02/2017 0000      Component Value Date/Time   CALCIUM 9.3 03/25/2020 0949   CALCIUM 9.8 07/19/2016 1305   ALKPHOS 56 03/25/2020 0949   ALKPHOS 76 07/19/2016 1305   AST 17 03/25/2020 0949   AST 18 07/19/2016 1305   ALT 5 03/25/2020 0949   ALT 14 07/19/2016 1305   BILITOT 0.7 03/25/2020 0949   BILITOT 0.70 07/19/2016 1305      Impression and Plan: [name] is a 59 year old gentleman.  Brian [name] has history of recurrent Hodgkin's disease. Brian [name] underwent a autologous stem cell transplant back in May of 2014. Brian [name] then had a recurrence after this.  Brian [name] did undergo CAR-T therapy at [facility].  I believe Brian [name] had this in April 2017.  Brian [name] did well with this.  Unfortunately, Brian [name] had another relapse in his spine.  This was proven by biopsy.  Brian [name] had radiation therapy for this.  From my perspective, everything looks fine.  I do not see any evidence of recurrent Hodgkin's disease.  The [facility] is clearly helping [place] out.  We will plan to get her back in 3 more months.    [name], MD 6/30/20218:59 AM

## 2020-05-25 NOTE — Patient Instructions (Signed)
[facility] Discharge Instructions for Patients Receiving Chemotherapy  Today you received the following chemotherapy agents:  Nivolumab  To help prevent nausea and vomiting after your treatment, we encourage you to take your nausea medication as prescribed.   If you develop nausea and vomiting that is not controlled by your nausea medication, call the clinic.   BELOW ARE SYMPTOMS THAT SHOULD BE REPORTED IMMEDIATELY:  *FEVER GREATER THAN 100.5 F  *CHILLS WITH OR WITHOUT FEVER  NAUSEA AND VOMITING THAT IS NOT CONTROLLED WITH YOUR NAUSEA MEDICATION  *UNUSUAL SHORTNESS OF BREATH  *UNUSUAL BRUISING OR BLEEDING  TENDERNESS IN MOUTH AND THROAT WITH OR WITHOUT PRESENCE OF ULCERS  *URINARY PROBLEMS  *BOWEL PROBLEMS  UNUSUAL RASH Items with * indicate a potential emergency and should be followed up as soon as possible.  Feel free to call the clinic should you have any questions or concerns. The clinic phone number is [phone].  Please show the CHEMO ALERT CARD at check-in to the Emergency Department and triage nurse.   

## 2020-05-25 NOTE — Telephone Encounter (Signed)
Appointments scheduled he will get updated calendar from infusion per 6/30 los

## 2020-06-06 ENCOUNTER — Other Ambulatory Visit: Payer: Self-pay | Admitting: Family Medicine

## 2020-06-06 DIAGNOSIS — N529 Male erectile dysfunction, unspecified: Secondary | ICD-10-CM

## 2020-06-08 ENCOUNTER — Telehealth: Payer: Self-pay | Admitting: Family Medicine

## 2020-06-08 DIAGNOSIS — N529 Male erectile dysfunction, unspecified: Secondary | ICD-10-CM

## 2020-06-08 MED ORDER — SILDENAFIL CITRATE 20 MG PO TABS: ORAL_TABLET | ORAL | 0 refills | Status: DC

## 2020-06-08 NOTE — Telephone Encounter (Signed)
resent

## 2020-06-08 NOTE — Telephone Encounter (Signed)
Patient states gave wrong pharmacy this morning and would like medication sent to [facility] instead.  sildenafil (REVATIO) 20 MG tablet  [facility] [address] [place], [place] Phone:  [phone]  Fax:  [phone]

## 2020-06-08 NOTE — Telephone Encounter (Signed)
Medication:sildenafil (REVATIO) 20 MG tablet [[id number]   Has the patient contacted their pharmacy? No. (If no, request that the patient contact the pharmacy for the refill.) (If yes, when and what did the pharmacy advise?)  Preferred Pharmacy (with phone number or street name): [facility] [place], [place] [address]  [address] [place] [address]  Phone:  [phone] Fax:  [phone]  DEA #:  [id number]  Agent: Please be advised that RX refills may take up to 3 business days. We ask that you follow-up with your pharmacy.

## 2020-06-12 NOTE — Progress Notes (Signed)
[facility] [address] [place], [place] [address] [id number] [phone]  Date:  06/15/2020   Name:  Brian [name]   DOB:  Jun 23, 1961   MRN:  [id number]  PCP:  Darreld Mclean, MD    Chief Complaint: Annual Exam   History of Present Illness:  Brian [name] is a 59 y.o. very pleasant male patient who presents with the following:  Here today for a CPE Last seen by myself History of parkinson disease, Hodgkin disease  Last seen by myself 08/2018 Most recent visit with oncology last month - s/p stem cell transplant in 2014, then recurrence in his spine s/p radiation. All stable at recent oncology appt Last visit with neurology last month as well:  1.  Parkinsons Disease             -Continue carbidopa/levodopa 25/100, 2 tablets in the morning, 2 in the afternoon and 1 in the evening             -Continue pramipexole, 1 mg 3 times per day             -We will try to add carbidopa/levodopa 50/200 at bedtime to see if that helps any of the arthralgias/myalgias in the morning. Suspect that it is not going to help and that this is really perhaps related to other issues (questionable degenerative changes). If the bedtime levodopa does not help, he will need to follow-up with his primary care physician regarding that.  Colon UTD Immun: covid done, shingrix? - he will ask his oncologist about this  Labs one month ago - just needs a PSA today He does a PET every 6 months  He notes that in the am he is really achy and stiff- it takes him about 15- 20 minutes to get loosened up.  After that he feels pretty good.  He gets more parkinson sx toward the end of the day; they recently increased his Sinemet dosage He did see urology last year- they checked a PSA for him.  Did not see urology this year   Sinemet benicar mirapex Sildenafil   He does walk for exercise and goes to the gym  He is no longer able to run due to aging and joint  issues He is frustrated by gaining a few lbs recently He avoid driving at night now due to vision changing  He is having some episodes of hypotension intermittently   He will get a home BP cuff and monitor his BP   His mood is ok, he has a GF which helps  Lab Results  Component Value Date   PSA 2.95 04/24/2019   PSA 2.89 01/16/2018   PSA 0.98 08/24/2013    BP Readings from Last 3 Encounters:  06/15/20 120/82  05/25/20 114/80  05/18/20 126/81    Patient Active Problem List   Diagnosis Date Noted  . Goals of care, counseling/discussion 09/25/2018  . Mass of right chest wall 09/18/2018  . PD (Parkinson's disease) ([place]) 11/27/2016  . Lymphadenopathy 07/28/2015  . Mediastinal adenopathy 07/14/2015  . Aphasia 03/04/2015  . Slurred speech 03/04/2015  . Pleural mass 05/31/2014  . Abdominal pain, epigastric 02/02/2014  . Weight loss 08/12/2012  . H/O Bell's palsy 08/12/2012  . Hodgkin's disease 08/09/2011    Past Medical History:  Diagnosis Date  . Anxiety   . Dysrhythmia    past hx pvc  . Goals of care, counseling/discussion 09/25/2018  .  H/O autologous stem cell transplant ([place])    may 2014  at [facility]  . History of Bell's palsy    2009  RIGHT SIDE--  HAS 80% FUNCTION / PT STATES A LITTLE ASYMETRICAL AND EFFECTS MOUTH  . History of radiation therapy 10/18/17-11/05/17   sprine T1 26 Gy in 13 fractions, spine boost 10 Gy in 5 fractions  . Hodgkin's disease, nodular sclerosis, of inguinal region/lower limb ([place]) ONOLOGIST--  DR [facility]NEVER AND A [facility]     SALVAGE CHEMO 2013/  AUTOLOGUS STEM CELL TRANSPLANT MAY 2014 AT [facility]  . Mass of right chest wall 09/18/2018  . Mass of right inguinal region   . PVC (premature ventricular contraction)    "benign"  . TIA (transient ischemic attack) 02/2015   "probable TIA"  . Wears contact lenses     Past Surgical History:  Procedure Laterality Date  . AXILLARY LYMPH NODE BIOPSY Left 02/02/2013   Procedure: NEEDLE LOCALIZED AXILLARY LYMPH  NODE BIOPSY;  Surgeon: Haywood Lasso, MD;  Location: [place];  Service: General;  Laterality: Left;  . BONE MARROW BIOPSY  08/2012  . CYST REMOVAL NECK Left 1980  . LEFT INGUINAL LYMPH NODE BX  09-09-2012  . LYMPH NODE BIOPSY N/A 07/28/2015   Procedure: LYMPH NODE BIOPSY;  Surgeon: Melrose Nakayama, MD;  Location: [place];  Service: Thoracic;  Laterality: N/A;  . MASS EXCISION Right 09/19/2018   Procedure: EXCISION OF RIGHT CHEST WALL MASS ERAS PATHWAY;  Surgeon: Fanny Skates, MD;  Location: [place];  Service: General;  Laterality: Right;  . NODE DISSECTION Right 07/28/2015   Procedure: NODE DISSECTION;  Surgeon: Melrose Nakayama, MD;  Location: [place];  Service: Thoracic;  Laterality: Right;  . PLEURA BIOPSY Left 05/31/2014  . REMOVAL RIGHT INGUINAL LYMPH NODES  08-16-2011  . SCROTAL EXPLORATION Right 01/04/2014   Procedure: SCROTUM EXPLORATION   INGUINAL , EXCISION OF CYSTIC MASS OF RIGHT SPERMATIC CORD, WITH FROZEN SECTION;  Surgeon: Franchot Gallo, MD;  Location: [facility];  Service: Urology;  Laterality: Right;  . TRANSTHORACIC ECHOCARDIOGRAM  03-18-2013   MILD LVH/  EF 55-60%  . VIDEO ASSISTED THORACOSCOPY Left 05/31/2014   Procedure: LEFT VIDEO ASSISTED THORACOSCOPY, PLEURAL BIOPSY;  Surgeon: Melrose Nakayama, MD;  Location: [place];  Service: Thoracic;  Laterality: Left;  Marland Kitchen VIDEO ASSISTED THORACOSCOPY Right 07/28/2015   Procedure: RIGHT VIDEO ASSISTED THORACOSCOPY;  Surgeon: Melrose Nakayama, MD;  Location: [place];  Service: Thoracic;  Laterality: Right;  Marland Kitchen VIDEO ASSISTED THORACOSCOPY (VATS)/ LYMPH NODE SAMPLING Right 07/28/2015  . VIDEO ASSISTED THORACOSCOPY (VATS)/WEDGE RESECTION  05/31/2014  . VIDEO BRONCHOSCOPY WITH ENDOBRONCHIAL ULTRASOUND  07/28/2015  . VIDEO BRONCHOSCOPY WITH ENDOBRONCHIAL ULTRASOUND N/A 07/28/2015   Procedure: VIDEO BRONCHOSCOPY WITH ENDOBRONCHIAL ULTRASOUND;  Surgeon: Melrose Nakayama, MD;  Location:  [place];  Service: Thoracic;  Laterality: N/A;    Social History   Tobacco Use  . Smoking status: Never Smoker  . Smokeless tobacco: Never Used  Vaping Use  . Vaping Use: Never used  Substance Use Topics  . Alcohol use: Yes    Alcohol/week: 14.0 standard drinks    Types: 7 Cans of beer, 7 Glasses of wine per week  . Drug use: No    Family History  Problem Relation Age of Onset  . Cancer Mother 53       ovarian  . Ovarian cancer Mother 79  . Heart disease Father   . Stroke Father   .  Cancer Brother        testicular  . Hypertension Brother   . Healthy Son   . Healthy Son   . Hypertension Brother   . Other Brother        CMT  . Hypertension Brother     No Known Allergies  Medication list has been reviewed and updated.  Current Outpatient Medications on File Prior to Visit  Medication Sig Dispense Refill  . ASPIRIN 81 PO     . carbidopa-levodopa (SINEMET CR) 50-200 MG tablet Take 1 tablet by mouth at bedtime. 30 tablet 0  . carbidopa-levodopa (SINEMET IR) 25-100 MG tablet 2 at 7am/2 at 11am/1 at 4pm 450 tablet 1  . Cholecalciferol (VITAMIN D3) 125 MCG (5000 UT) CAPS Take 5,000 Units by mouth 4 (four) times a week.    . olmesartan (BENICAR) 40 MG tablet TAKE 1 TABLET(40 MG) BY MOUTH DAILY 30 tablet 6  . pramipexole (MIRAPEX) 1 MG tablet TAKE 1 TABLET(1 MG) BY MOUTH THREE TIMES DAILY 270 tablet 1  . sildenafil (REVATIO) 20 MG tablet TAKE 1 TO 5 TABLETS BY MOUTH DAILY AS NEEDED PRIOR TO INTERCOURSE 10 tablet 0   No current facility-administered medications on file prior to visit.    Review of Systems:  As per HPI- otherwise negative.   Physical Examination: Vitals:   06/15/20 0903  BP: 120/82  Pulse: 85  Resp: 16  SpO2: 97%   Vitals:   06/15/20 0903  Weight: 188 lb (85.3 kg)  Height: _0  (1.778 m)   Body mass index is 26.98 kg/m. Ideal Body Weight: Weight in (lb) to have BMI = 25: 173.9  GEN: no acute distress. Normal weight, looks well  HEENT:  Atraumatic, Normocephalic.   Bilateral TM wnl, oropharynx normal.  PEERL,EOMI.   Ears and Nose: No external deformity. CV: RRR, No M/G/R. No JVD. No thrill. No extra heart sounds. PULM: CTA B, no wheezes, crackles, rhonchi. No retractions. No resp. distress. No accessory muscle use. ABD: S, NT, ND, +BS. No rebound. No HSM. EXTR: No c/c/e PSYCH: Normally interactive. Conversant.    Assessment and Plan: Physical exam  Screening for prostate cancer - Plan: PSA  PD (Parkinson's disease) ([place])  Hodgkin's disease  Here today for a CPE Overall Jayton is doing well He is frustrated some by weight gain and stiffness in the am.  I encouraged him to continue exercising and to try taking tylenol at night as needed He notes nocturia x3- encouraged him to fluid restrict the last 2-3 hours before bed.  He does not want to go on flomax for now Will check PSA for him today - Will plan further follow- up pending labs.  This visit occurred during the SARS-CoV-2 public health emergency.  Safety protocols were in place, including screening questions prior to the visit, additional usage of staff PPE, and extensive cleaning of exam room while observing appropriate contact time as indicated for disinfecting solutions.    Signed Lamar Blinks, MD

## 2020-06-12 NOTE — Patient Instructions (Addendum)
Good to see you again today- I will check your PSA and be in touch with you It sounds like your BP is going too low at times- please try cutting back your Benicar dose to 20mg .  Check your BP at home a couple of times a week.  I would like you running 120- 140/80-90 approx.  If you start going significantly higher than this go back up to 40 mg of benicar Try cutting your fluid intake off a couple of hours before bed- this may help with excess nighttime uriantion   Health Maintenance, Male Adopting a healthy lifestyle and getting preventive care are important in promoting health and wellness. Ask your health care provider about:  The right schedule for you to have regular tests and exams.  Things you can do on your own to prevent diseases and keep yourself healthy. What should I know about diet, weight, and exercise? Eat a healthy diet   Eat a diet that includes plenty of vegetables, fruits, low-fat dairy products, and lean protein.  Do not eat a lot of foods that are high in solid fats, added sugars, or sodium. Maintain a healthy weight Body mass index (BMI) is a measurement that can be used to identify possible weight problems. It estimates body fat based on height and weight. Your health care provider can help determine your BMI and help you achieve or maintain a healthy weight. Get regular exercise Get regular exercise. This is one of the most important things you can do for your health. Most adults should:  Exercise for at least 150 minutes each week. The exercise should increase your heart rate and make you sweat (moderate-intensity exercise).  Do strengthening exercises at least twice a week. This is in addition to the moderate-intensity exercise.  Spend less time sitting. Even light physical activity can be beneficial. Watch cholesterol and blood lipids Have your blood tested for lipids and cholesterol at 60 years of age, then have this test every 5 years. You may need to have  your cholesterol levels checked more often if:  Your lipid or cholesterol levels are high.  You are older than 60 years of age.  You are at high risk for heart disease. What should I know about cancer screening? Many types of cancers can be detected early and may often be prevented. Depending on your health history and family history, you may need to have cancer screening at various ages. This may include screening for:  Colorectal cancer.  Prostate cancer.  Skin cancer.  Lung cancer. What should I know about heart disease, diabetes, and high blood pressure? Blood pressure and heart disease  High blood pressure causes heart disease and increases the risk of stroke. This is more likely to develop in people who have high blood pressure readings, are of African descent, or are overweight.  Talk with your health care provider about your target blood pressure readings.  Have your blood pressure checked: ? Every 3-5 years if you are 26-57 years of age. ? Every year if you are 47 years old or older.  If you are between the ages of 83 and 25 and are a current or former smoker, ask your health care provider if you should have a one-time screening for abdominal aortic aneurysm (AAA). Diabetes Have regular diabetes screenings. This checks your fasting blood sugar level. Have the screening done:  Once every three years after age 104 if you are at a normal weight and have a low risk for diabetes.  More often and at a younger age if you are overweight or have a high risk for diabetes. What should I know about preventing infection? Hepatitis B If you have a higher risk for hepatitis B, you should be screened for this virus. Talk with your health care provider to find out if you are at risk for hepatitis B infection. Hepatitis C Blood testing is recommended for:  Everyone born from [birth year] through 1966.  Anyone with known risk factors for hepatitis C. Sexually transmitted infections  (STIs)  You should be screened each year for STIs, including gonorrhea and chlamydia, if: ? You are sexually active and are younger than 60 years of age. ? You are older than 60 years of age and your health care provider tells you that you are at risk for this type of infection. ? Your sexual activity has changed since you were last screened, and you are at increased risk for chlamydia or gonorrhea. Ask your health care provider if you are at risk.  Ask your health care provider about whether you are at high risk for HIV. Your health care provider may recommend a prescription medicine to help prevent HIV infection. If you choose to take medicine to prevent HIV, you should first get tested for HIV. You should then be tested every 3 months for as long as you are taking the medicine. Follow these instructions at home: Lifestyle  Do not use any products that contain nicotine or tobacco, such as cigarettes, e-cigarettes, and chewing tobacco. If you need help quitting, ask your health care provider.  Do not use street drugs.  Do not share needles.  Ask your health care provider for help if you need support or information about quitting drugs. Alcohol use  Do not drink alcohol if your health care provider tells you not to drink.  If you drink alcohol: ? Limit how much you have to 0-2 drinks a day. ? Be aware of how much alcohol is in your drink. In the [place] one drink equals one 12 oz bottle of beer (355 mL), one 5 oz glass of wine (148 mL), or one 1 oz glass of hard liquor (44 mL). General instructions  Schedule regular health, dental, and eye exams.  Stay current with your vaccines.  Tell your health care provider if: ? You often feel depressed. ? You have ever been abused or do not feel safe at home. Summary  Adopting a healthy lifestyle and getting preventive care are important in promoting health and wellness.  Follow your health care provider's instructions about healthy diet,  exercising, and getting tested or screened for diseases.  Follow your health care provider's instructions on monitoring your cholesterol and blood pressure. This information is not intended to replace advice given to you by your health care provider. Make sure you discuss any questions you have with your health care provider. Document Revised: 11/05/2018 Document Reviewed: 11/05/2018 Elsevier Patient Education  2020 [employer].

## 2020-06-15 ENCOUNTER — Encounter: Payer: Self-pay | Admitting: Family Medicine

## 2020-06-15 ENCOUNTER — Ambulatory Visit (INDEPENDENT_AMBULATORY_CARE_PROVIDER_SITE_OTHER): Payer: Commercial Managed Care - PPO | Admitting: Family Medicine

## 2020-06-15 ENCOUNTER — Other Ambulatory Visit: Payer: Self-pay

## 2020-06-15 VITALS — BP 120/82 | HR 85 | Resp 16 | Ht 70.0 in | Wt 188.0 lb

## 2020-06-15 DIAGNOSIS — Z125 Encounter for screening for malignant neoplasm of prostate: Secondary | ICD-10-CM | POA: Diagnosis not present

## 2020-06-15 DIAGNOSIS — G20 Parkinson's disease: Secondary | ICD-10-CM

## 2020-06-15 DIAGNOSIS — Z0000 Encounter for general adult medical examination without abnormal findings: Secondary | ICD-10-CM

## 2020-06-15 DIAGNOSIS — C8195 Hodgkin lymphoma, unspecified, lymph nodes of inguinal region and lower limb: Secondary | ICD-10-CM | POA: Diagnosis not present

## 2020-06-15 LAB — PSA: PSA: 2.44 ng/mL (ref 0.10–4.00)

## 2020-06-21 ENCOUNTER — Telehealth: Payer: Self-pay | Admitting: Neurology

## 2020-06-21 NOTE — Telephone Encounter (Signed)
Patient called in wanting to let Dr. Carles Collet know he is not going to fill his prescription for the Sinemet CR. He doesn't feel that it is working better than a tylenol would. He is going to continue with his Sinemet IR.

## 2020-06-23 NOTE — Telephone Encounter (Signed)
noted 

## 2020-06-27 ENCOUNTER — Other Ambulatory Visit: Payer: Self-pay | Admitting: Neurology

## 2020-06-28 NOTE — Telephone Encounter (Signed)
Rx(s) sent to pharmacy electronically.  

## 2020-08-15 ENCOUNTER — Encounter: Payer: Self-pay | Admitting: Family Medicine

## 2020-08-25 ENCOUNTER — Inpatient Hospital Stay (HOSPITAL_BASED_OUTPATIENT_CLINIC_OR_DEPARTMENT_OTHER): Payer: Commercial Managed Care - PPO | Admitting: Hematology & Oncology

## 2020-08-25 ENCOUNTER — Other Ambulatory Visit: Payer: Self-pay

## 2020-08-25 ENCOUNTER — Encounter: Payer: Self-pay | Admitting: Hematology & Oncology

## 2020-08-25 ENCOUNTER — Inpatient Hospital Stay: Payer: Commercial Managed Care - PPO | Attending: Hematology & Oncology

## 2020-08-25 ENCOUNTER — Inpatient Hospital Stay: Payer: Commercial Managed Care - PPO

## 2020-08-25 ENCOUNTER — Telehealth: Payer: Self-pay | Admitting: Hematology & Oncology

## 2020-08-25 VITALS — BP 134/77 | HR 76 | Temp 98.2°F | Resp 18 | Wt 187.0 lb

## 2020-08-25 DIAGNOSIS — C8195 Hodgkin lymphoma, unspecified, lymph nodes of inguinal region and lower limb: Secondary | ICD-10-CM

## 2020-08-25 DIAGNOSIS — Z923 Personal history of irradiation: Secondary | ICD-10-CM | POA: Diagnosis not present

## 2020-08-25 DIAGNOSIS — Z8571 Personal history of Hodgkin lymphoma: Secondary | ICD-10-CM | POA: Diagnosis not present

## 2020-08-25 DIAGNOSIS — Z9484 Stem cells transplant status: Secondary | ICD-10-CM | POA: Diagnosis not present

## 2020-08-25 LAB — CBC WITH DIFFERENTIAL (CANCER CENTER ONLY)
Abs Immature Granulocytes: 0.02 10*3/uL (ref 0.00–0.07)
Basophils Absolute: 0.1 10*3/uL (ref 0.0–0.1)
Basophils Relative: 1 %
Eosinophils Absolute: 0.3 10*3/uL (ref 0.0–0.5)
Eosinophils Relative: 6 %
HCT: 41.6 % (ref 39.0–52.0)
Hemoglobin: 13.6 g/dL (ref 13.0–17.0)
Immature Granulocytes: 0 %
Lymphocytes Relative: 14 %
Lymphs Abs: 0.8 10*3/uL (ref 0.7–4.0)
MCH: 32.9 pg (ref 26.0–34.0)
MCHC: 32.7 g/dL (ref 30.0–36.0)
MCV: 100.5 fL — ABNORMAL HIGH (ref 80.0–100.0)
Monocytes Absolute: 0.6 10*3/uL (ref 0.1–1.0)
Monocytes Relative: 11 %
Neutro Abs: 3.8 10*3/uL (ref 1.7–7.7)
Neutrophils Relative %: 68 %
Platelet Count: 215 10*3/uL (ref 150–400)
RBC: 4.14 MIL/uL — ABNORMAL LOW (ref 4.22–5.81)
RDW: 12.7 % (ref 11.5–15.5)
WBC Count: 5.6 10*3/uL (ref 4.0–10.5)
nRBC: 0 % (ref 0.0–0.2)

## 2020-08-25 LAB — CMP (CANCER CENTER ONLY)
ALT: 4 U/L (ref 0–44)
AST: 15 U/L (ref 15–41)
Albumin: 4.5 g/dL (ref 3.5–5.0)
Alkaline Phosphatase: 58 U/L (ref 38–126)
Anion gap: 6 (ref 5–15)
BUN: 35 mg/dL — ABNORMAL HIGH (ref 6–20)
CO2: 30 mmol/L (ref 22–32)
Calcium: 9.7 mg/dL (ref 8.9–10.3)
Chloride: 104 mmol/L (ref 98–111)
Creatinine: 1.34 mg/dL — ABNORMAL HIGH (ref 0.61–1.24)
GFR, Est AFR Am: >60 mL/min (ref >60)
GFR, Estimated: 57 mL/min — ABNORMAL LOW (ref >60)
Glucose, Bld: 97 mg/dL (ref 70–99)
Potassium: 4.5 mmol/L (ref 3.5–5.1)
Sodium: 140 mmol/L (ref 135–145)
Total Bilirubin: 0.5 mg/dL (ref 0.3–1.2)
Total Protein: 6.8 g/dL (ref 6.5–8.1)

## 2020-08-25 LAB — LACTATE DEHYDROGENASE: LDH: 184 U/L (ref 98–192)

## 2020-08-25 MED ORDER — SODIUM CHLORIDE 0.9 % IV SOLN
Freq: Once | INTRAVENOUS | Status: AC
  Filled 2020-08-25: qty 250

## 2020-08-25 MED ORDER — SODIUM CHLORIDE 0.9 % IV SOLN
480.0000 mg | Freq: Once | INTRAVENOUS | Status: AC
  Administered 2020-08-25: 480 mg via INTRAVENOUS
  Filled 2020-08-25: qty 48

## 2020-08-25 NOTE — Patient Instructions (Signed)
[facility] Discharge Instructions for Patients Receiving Chemotherapy  Today you received the following chemotherapy agents:  Nivolumab  To help prevent nausea and vomiting after your treatment, we encourage you to take your nausea medication as prescribed.   If you develop nausea and vomiting that is not controlled by your nausea medication, call the clinic.   BELOW ARE SYMPTOMS THAT SHOULD BE REPORTED IMMEDIATELY:  *FEVER GREATER THAN 100.5 F  *CHILLS WITH OR WITHOUT FEVER  NAUSEA AND VOMITING THAT IS NOT CONTROLLED WITH YOUR NAUSEA MEDICATION  *UNUSUAL SHORTNESS OF BREATH  *UNUSUAL BRUISING OR BLEEDING  TENDERNESS IN MOUTH AND THROAT WITH OR WITHOUT PRESENCE OF ULCERS  *URINARY PROBLEMS  *BOWEL PROBLEMS  UNUSUAL RASH Items with * indicate a potential emergency and should be followed up as soon as possible.  Feel free to call the clinic should you have any questions or concerns. The clinic phone number is [phone].  Please show the CHEMO ALERT CARD at check-in to the Emergency Department and triage nurse.   

## 2020-08-25 NOTE — Progress Notes (Signed)
Hematology and Oncology Follow Up Visit  Brian [name] [id number] 1961-03-27 59 y.o. 08/25/2020   Principle Diagnosis:  Recurrent Hodgkin's Disease -  S/p CAR-T therapy  TIA-resolved  Current Therapy:  Nivolumab q 2 month -- s/p cycle #16       Interim History:  Brian [name] is in for follow-up.  As always, he is doing quite well.  I think really helps that he has his lady "friend" who is really taking good care of him.  He is exercising.  His Parkinson's is still little bit of an issue.  He is on medication for this.  The big problem he has is in the morning when he wakes up, he has a lot of pain in his hips and lower back.  He takes an Advil and this seems to resolve.  He has had no issues with diarrhea.  He has had no cough or shortness of breath.  He has had no rashes.  He has had no leg swelling.  Overall, his performance status is ECOG 0.    Medications:  Current Outpatient Medications:  .  ASPIRIN 81 PO, , Disp: , Rfl:  .  carbidopa-levodopa (SINEMET IR) 25-100 MG tablet, 2 at 7am/2 at 11am/1 at 4pm, Disp: 450 tablet, Rfl: 1 .  Cholecalciferol (VITAMIN D3) 125 MCG (5000 UT) CAPS, Take 5,000 Units by mouth 4 (four) times a week., Disp: , Rfl:  .  olmesartan (BENICAR) 40 MG tablet, TAKE 1 TABLET(40 MG) BY MOUTH DAILY (Patient taking differently: 40 mg. Pt. Takes 1/2 tablet total of 20 mg every morning per his PCP.), Disp: 30 tablet, Rfl: 6 .  pramipexole (MIRAPEX) 1 MG tablet, TAKE 1 TABLET(1 MG) BY MOUTH THREE TIMES DAILY, Disp: 270 tablet, Rfl: 1 .  sildenafil (REVATIO) 20 MG tablet, TAKE 1 TO 5 TABLETS BY MOUTH DAILY AS NEEDED PRIOR TO INTERCOURSE, Disp: 10 tablet, Rfl: 0  Allergies: No Known Allergies  Past Medical History, Surgical history, Social history, and Family History were reviewed and updated.  Review of Systems: Review of Systems  Neurological: Positive for tremors.  All other systems reviewed and are negative.    Physical Exam:  weight is 187 lb (84.8  kg). His oral temperature is 98.2 F (36.8 C). His blood pressure is 134/77 and his pulse is 76. His respiration is 18 and oxygen saturation is 100%.   Physical Exam Vitals reviewed.  HENT:     Head: Normocephalic and atraumatic.  Eyes:     Pupils: Pupils are equal, round, and reactive to light.  Cardiovascular:     Rate and Rhythm: Normal rate and regular rhythm.     Heart sounds: Normal heart sounds.  Pulmonary:     Effort: Pulmonary effort is normal.     Breath sounds: Normal breath sounds.  Abdominal:     General: Bowel sounds are normal.     Palpations: Abdomen is soft.  Musculoskeletal:        General: No tenderness or deformity. Normal range of motion.     Cervical back: Normal range of motion.  Lymphadenopathy:     Cervical: No cervical adenopathy.  Skin:    General: Skin is warm and dry.     Findings: No erythema or rash.  Neurological:     Mental Status: He is alert and oriented to person, place, and time.  Psychiatric:        Behavior: Behavior normal.        Thought Content: Thought content normal.  Judgment: Judgment normal.     Lab Results  Component Value Date   WBC 5.6 08/25/2020   HGB 13.6 08/25/2020   HCT 41.6 08/25/2020   MCV 100.5 (H) 08/25/2020   PLT 215 08/25/2020     Chemistry      Component Value Date/Time   NA 140 05/25/2020 0835   NA 142 04/02/2017 0000   NA 140 07/19/2016 1305   K 4.5 05/25/2020 0835   K 4.4 07/19/2016 1305   CL 107 05/25/2020 0835   CL 105 02/08/2016 1117   CL 107 03/25/2013 0828   CO2 26 05/25/2020 0835   CO2 26 07/19/2016 1305   BUN 35 (H) 05/25/2020 0835   BUN 21 04/02/2017 0000   BUN 22.8 07/19/2016 1305   CREATININE 1.36 (H) 05/25/2020 0835   CREATININE 1.1 07/19/2016 1305   GLU 105 04/02/2017 0000      Component Value Date/Time   CALCIUM 9.3 05/25/2020 0835   CALCIUM 9.8 07/19/2016 1305   ALKPHOS 60 05/25/2020 0835   ALKPHOS 76 07/19/2016 1305   AST 16 05/25/2020 0835   AST 18 07/19/2016 1305    ALT 3 05/25/2020 0835   ALT 14 07/19/2016 1305   BILITOT 0.7 05/25/2020 0835   BILITOT 0.70 07/19/2016 1305      Impression and Plan: Brian [name] is a 59 year old gentleman.  He has history of recurrent Hodgkin's disease. 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 He then had a recurrence after this.  He did undergo [facility].  I believe he had this in April 2017.  He did well with this.  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  This was proven by biopsy.  He had radiation therapy for this.  I think the real question now is when or if we can stop the nivolumab.  He has had 16 cycles to date.  This will be his 17th cycle.  It would be nice if we could stop the nivolumab.  There just is no data out there that I can remember or see that can tell us that we can stop the nivolumab.  We will do his next PET scan in January 2022 before we see him back.      [name], MD 9/30/20219:06 AM

## 2020-08-25 NOTE — Telephone Encounter (Signed)
Appointments scheduled calendar printed per 9/30 los

## 2020-08-30 ENCOUNTER — Telehealth: Payer: Self-pay

## 2020-08-30 ENCOUNTER — Other Ambulatory Visit: Payer: Self-pay

## 2020-08-30 DIAGNOSIS — G20 Parkinson's disease: Secondary | ICD-10-CM

## 2020-08-30 NOTE — Telephone Encounter (Signed)
Spoke with patient and he states he is having back pain, pain shooting down him hips and legs. He states he mentioned it when he last saw Dr Tat.  He wants to know if it is arthritis or if its parkinson's.    He states he only has pain in morning and rates his pain as 8-10.He states it happens every morning. He states he thought about CBD oil but he does not know what to do. Patient states he has been disease free for two years.    Patient wants to know if this is something he can expect with parkinson's?   Asked patient if he has reached out to his pcp and he states he has not.

## 2020-08-30 NOTE — Telephone Encounter (Signed)
He did ask me this last time and I told him I did not think that Parkinsons Disease related but we had him try bedtime levodopa to see if it helped (to see if it was Parkinsons Disease related) and it did not.  I ultimately don't think that its related to his Parkinsons Disease.  I think that we need to get his PCP to evaluate that and/or sports med

## 2020-08-30 NOTE — Telephone Encounter (Signed)
Patient was notified and and voiced understanding.

## 2020-08-31 ENCOUNTER — Other Ambulatory Visit: Payer: Self-pay | Admitting: Hematology & Oncology

## 2020-08-31 ENCOUNTER — Encounter: Payer: Self-pay | Admitting: Hematology & Oncology

## 2020-08-31 DIAGNOSIS — C8195 Hodgkin lymphoma, unspecified, lymph nodes of inguinal region and lower limb: Secondary | ICD-10-CM

## 2020-09-02 ENCOUNTER — Encounter: Payer: Self-pay | Admitting: Hematology & Oncology

## 2020-09-07 ENCOUNTER — Encounter: Payer: Self-pay | Admitting: Hematology & Oncology

## 2020-09-07 ENCOUNTER — Encounter: Payer: Self-pay | Admitting: Family Medicine

## 2020-09-14 ENCOUNTER — Ambulatory Visit (HOSPITAL_COMMUNITY)
Admission: RE | Admit: 2020-09-14 | Discharge: 2020-09-14 | Disposition: A | Discharge status: Home / Self Care | Payer: Commercial Managed Care - PPO | Source: Ambulatory Visit | Attending: Hematology & Oncology | Admitting: Hematology & Oncology

## 2020-09-14 ENCOUNTER — Other Ambulatory Visit: Payer: Self-pay

## 2020-09-14 DIAGNOSIS — C8195 Hodgkin lymphoma, unspecified, lymph nodes of inguinal region and lower limb: Secondary | ICD-10-CM | POA: Diagnosis present

## 2020-09-14 LAB — GLUCOSE, CAPILLARY: Glucose-Capillary: 99 mg/dL (ref 70–99)

## 2020-09-14 MED ORDER — FLUDEOXYGLUCOSE F - 18 (FDG) INJECTION
9.1900 | Freq: Once | INTRAVENOUS | Status: AC
  Administered 2020-09-14: 9.19 via INTRAVENOUS

## 2020-09-15 ENCOUNTER — Telehealth: Payer: Self-pay | Admitting: *Deleted

## 2020-09-15 ENCOUNTER — Encounter: Payer: Self-pay | Admitting: Family Medicine

## 2020-09-15 ENCOUNTER — Encounter: Payer: Self-pay | Admitting: Hematology & Oncology

## 2020-09-15 NOTE — Telephone Encounter (Signed)
Patient notified per order of Dr. Marin Olp that there is "NO Hodgkin's disease!!  Looks like spinal issues with degeneration.  You need to call your family MD and have this looked into. Brian [name]"  Pt appreciative of call and has no questions at this time.

## 2020-09-15 NOTE — Telephone Encounter (Signed)
-----   Message from [name], MD sent at 09/15/2020 10:21 AM EDT ----- Call - NO Hodgkin's Disease!! Looks like spinal issues with degeneration.  YOu need to call your family MD and have this looked into.  Brian [name]

## 2020-09-21 ENCOUNTER — Other Ambulatory Visit: Payer: Self-pay | Admitting: Hematology & Oncology

## 2020-09-27 ENCOUNTER — Telehealth: Payer: Self-pay | Admitting: Family Medicine

## 2020-09-27 NOTE — Telephone Encounter (Signed)
Ok to patient to have booster?

## 2020-09-27 NOTE — Telephone Encounter (Signed)
Patient is calling to speak to the nurse regarding the booster vaccine. He wants to know if there is a reason he should not get it. Please give him a call back at  [phone] and advise.

## 2020-09-27 NOTE — Telephone Encounter (Signed)
Sent patient mychart message
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
## 2020-10-01 NOTE — Patient Instructions (Addendum)
Good to see you again today! I will set you up for an MRI of your lumbar spine For now try taking flexeril at bedtime and see if this is helpful  We can do PT at any time in the future if you like

## 2020-10-03 ENCOUNTER — Ambulatory Visit: Payer: Commercial Managed Care - PPO | Attending: Internal Medicine

## 2020-10-03 ENCOUNTER — Ambulatory Visit (INDEPENDENT_AMBULATORY_CARE_PROVIDER_SITE_OTHER): Payer: Commercial Managed Care - PPO | Admitting: Family Medicine

## 2020-10-03 ENCOUNTER — Encounter: Payer: Self-pay | Admitting: Family Medicine

## 2020-10-03 ENCOUNTER — Other Ambulatory Visit: Payer: Self-pay

## 2020-10-03 ENCOUNTER — Other Ambulatory Visit (HOSPITAL_BASED_OUTPATIENT_CLINIC_OR_DEPARTMENT_OTHER): Payer: Self-pay | Admitting: Internal Medicine

## 2020-10-03 VITALS — BP 132/82 | HR 77 | Resp 16 | Ht 70.0 in | Wt 186.0 lb

## 2020-10-03 DIAGNOSIS — M545 Low back pain, unspecified: Secondary | ICD-10-CM

## 2020-10-03 DIAGNOSIS — M79605 Pain in left leg: Secondary | ICD-10-CM

## 2020-10-03 DIAGNOSIS — Z23 Encounter for immunization: Secondary | ICD-10-CM

## 2020-10-03 MED ORDER — CYCLOBENZAPRINE HCL 10 MG PO TABS: 10.0000 mg | ORAL_TABLET | Freq: Every day | ORAL | 0 refills | Status: DC

## 2020-10-07 ENCOUNTER — Ambulatory Visit: Payer: Commercial Managed Care - PPO

## 2020-10-07 MED FILL — PFIZER-BIONTECH COVID-19 VA: 30 | 1 days supply | Qty: 0 | Fill #0

## 2020-10-11 ENCOUNTER — Other Ambulatory Visit: Payer: Self-pay | Admitting: Family Medicine

## 2020-10-11 DIAGNOSIS — N529 Male erectile dysfunction, unspecified: Secondary | ICD-10-CM

## 2020-10-23 ENCOUNTER — Ambulatory Visit
Admission: RE | Admit: 2020-10-23 | Discharge: 2020-10-23 | Disposition: A | Discharge status: Home / Self Care | Payer: Commercial Managed Care - PPO | Source: Ambulatory Visit | Attending: Family Medicine | Admitting: Family Medicine

## 2020-10-23 ENCOUNTER — Other Ambulatory Visit: Payer: Self-pay

## 2020-10-23 DIAGNOSIS — M545 Low back pain, unspecified: Secondary | ICD-10-CM

## 2020-10-24 ENCOUNTER — Encounter: Payer: Self-pay | Admitting: Hematology & Oncology

## 2020-10-24 ENCOUNTER — Encounter: Payer: Self-pay | Admitting: Family Medicine

## 2020-10-26 ENCOUNTER — Other Ambulatory Visit: Payer: Self-pay | Admitting: Neurology

## 2020-10-26 ENCOUNTER — Other Ambulatory Visit: Payer: Self-pay

## 2020-10-26 MED ORDER — CARBIDOPA-LEVODOPA 25-100 MG PO TABS
ORAL_TABLET | ORAL | 0 refills | Status: DC
Start: 2020-10-26 — End: 2020-11-22

## 2020-10-26 NOTE — Telephone Encounter (Signed)
Patient needs a refill on his carbidopa-levodopa sent to [facility] at [facility]. He is out as of today.

## 2020-10-26 NOTE — Telephone Encounter (Signed)
Patient has been scheduled for VV per [place] tomorrow at 1:00

## 2020-10-26 NOTE — Telephone Encounter (Signed)
Refills to last until next appointment sent to [facility] at [facility]
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
## 2020-10-27 ENCOUNTER — Other Ambulatory Visit: Payer: Self-pay

## 2020-10-27 ENCOUNTER — Other Ambulatory Visit: Payer: Self-pay | Admitting: Neurology

## 2020-10-27 ENCOUNTER — Telehealth (INDEPENDENT_AMBULATORY_CARE_PROVIDER_SITE_OTHER): Payer: Commercial Managed Care - PPO | Admitting: Family Medicine

## 2020-10-27 DIAGNOSIS — M79605 Pain in left leg: Secondary | ICD-10-CM | POA: Diagnosis not present

## 2020-10-27 DIAGNOSIS — M545 Low back pain, unspecified: Secondary | ICD-10-CM

## 2020-10-27 MED ORDER — HYDROCODONE-ACETAMINOPHEN 5-325 MG PO TABS: 1.0000 | ORAL_TABLET | Freq: Three times a day (TID) | ORAL | 0 refills | Status: DC | PRN

## 2020-10-27 NOTE — Telephone Encounter (Signed)
Left message for patient informing him that his rx was sent over with no additional refills because he has an upcoming appt next month with Dr Tat.   Advised patient that at his next visit he can request additional refills after speaking with Dr Tat.   Advised a call back with any questions.

## 2020-11-14 NOTE — Progress Notes (Signed)
Assessment/Plan:   1.  Parkinsons Disease  -Continue carbidopa/levodopa 25/100, 2 tablets in the morning, 2 in the afternoon and 1 in the evening             -Continue pramipexole, 1 mg 3 times per day  -discussed sx and indications for such but he doesn't need right now.  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  Patient has symptomatic reinnervation of the right face.  3.  Hodgkin's lymphoma             -Doing well from that standpoint.  Following with oncology.  4.  Renal insufficiency             -Creatinine steadily been rising over the last year.  No need to alter pramipexole dosing at this point, but it is something we will need to monitor.   5. Lightheadedness/dizziness             -monitoring.  On benicar  6.  Lumbar radiculopathy  -This is most certainly the source of his back and hip pain.   -he is following with neurosx and possibly considering a microdiscectomy  7.  Sialorrhea  -This is commonly associated with PD.  We talked about treatments.  The patient is not a candidate for oral anticholinergic therapy because of increased risk of confusion and falls.  We discussed Botox (type A and B) and 1% atropine drops.  We discusssed that candy like lemon drops can help by stimulating mm of the oropharynx to induce swallowing.  He doesn't want botox right now  Subjective:   Brian [name] was seen today in follow up for Parkinsons disease.  My previous records were reviewed prior to todays visit as well as outside records available to me. Pt denies falls but has had near falls (esp in the shower).  Pt can tell when med wears off.  Pt with some lightheadedness, but no near syncope.  No hallucinations.  Some visual distortions.  Some drooling at night.  Last visit (and I believe the visit prior), the patient mentioned to me that he was having back pain and pain shooting down the legs and hips and wanted to  know if he thought it was related to his Parkinson's disease.  It was most prominent in the mornings when he first woke up.  I told him that I did not think that it was related to the Parkinson's disease, but we did try some nighttime levodopa to see if that would help, and it did not.  He called back in October asking me again if I thought it was related to the Parkinson's disease and I again told him that I did not think that it was and wanted him to get his primary care doctor or sports medicine to evaluate it and the patient became somewhat frustrated and asked for a second opinion at [facility] movement disorder center (we did put in that referral).  Interestingly, since that time, Patient had MRI lumbar spine completed October 23, 2020 by primary care.  There was high-grade spinal stenosis at L4-L5 with advanced left neural foraminal stenosis.  There was degenerative marrow edema at L3-L4 and L4-L5.  I personally reviewed the images.  Primary care put in a referral to neurosurgery, but inadvertently directed the referral to [place].  He since saw Dr. Marcello Moores.  He is going  to go to PT.  He is planning on possibly having microdiscectomy but is trying epidural steroid injections first.  Current prescribed movement disorder medications: carbidopa/levodopa 25/100, 2 tablets in the morning, 2 in the afternoon and 1 in the evening pramipexole, 1 mg 3 times per day  PREVIOUS MEDICATIONS: pramipexole; levodopa; (have discussed rotigotine because of wearing off but he decided to keep medication as is); carbidopa/levodopa 50/200 (tolerated fine, we just d/c because not needed)  ALLERGIES:  No Known Allergies  CURRENT MEDICATIONS:  Outpatient Encounter Medications as of 11/28/2020  Medication Sig  . aspirin EC 81 MG tablet Take 81 mg by mouth daily. Swallow whole.  . carbidopa-levodopa (SINEMET IR) 25-100 MG tablet TAKE 2 TABLETS AT 7 AM, 2 TABLETS AT 11 AM, AND 1 TABLET AT 4 PM  . Cholecalciferol (VITAMIN D3) 125  MCG (5000 UT) CAPS Take 5,000 Units by mouth 4 (four) times a week.  . cyclobenzaprine (FLEXERIL) 10 MG tablet Take 1 tablet (10 mg total) by mouth at bedtime.  Marland Kitchen HYDROcodone-acetaminophen (NORCO/VICODIN) 5-325 MG tablet Take 1-2 tablets by mouth every 8 (eight) hours as needed.  Marland Kitchen olmesartan (BENICAR) 20 MG tablet Take 20 mg by mouth daily.  . pramipexole (MIRAPEX) 1 MG tablet TAKE 1 TABLET(1 MG) BY MOUTH THREE TIMES DAILY  . sildenafil (REVATIO) 20 MG tablet TAKE 1 TO 5 TABLETS BY MOUTH DAILY AS NEEDED PRIOR TO INTERCOURSE  . Vitamin D, Ergocalciferol, (DRISDOL) 1.25 MG (50000 UNIT) CAPS capsule Take 50,000 Units by mouth every 7 (seven) days.  . [DISCONTINUED] carbidopa-levodopa (SINEMET IR) 25-100 MG tablet 2 at 7am/2 at 11am/1 at 4pm   No facility-administered encounter medications on file as of 11/28/2020.    Objective:   PHYSICAL EXAMINATION:    VITALS:   Vitals:   11/28/20 1121  BP: 130/80  Pulse: 78  Resp: 18  SpO2: 99%  Weight: 189 lb (85.7 kg)  Height: 5\' 10"  (1.778 m)    GEN:  The patient appears stated age and is in NAD. HEENT:  Normocephalic, atraumatic.  The mucous membranes are moist. The superficial temporal arteries are without ropiness or tenderness. CV:  RRR Lungs:  CTAB Neck/HEME:  There are no carotid bruits bilaterally.  Neurological examination:  Orientation: The patient is alert and oriented x3. Cranial nerves: There is chronic R facial droop with facial hypomimia. The speech is fluent and clear. Soft palate rises symmetrically and there is no tongue deviation. Hearing is intact to conversational tone. Sensation: Sensation is intact to light touch throughout Motor: Strength is at least antigravity x4.  Movement examination: Tone: There is mild increased tone in the LUE Abnormal movements: there is mild LE dyskinesia Coordination:  There is no decremation with RAM's, with any form of RAMS, including alternating supination and pronation of the forearm,  hand opening and closing, finger taps, heel taps and toe taps. Gait and Station: The patient has no  difficulty arising out of a deep-seated chair without the use of the hands. The patient's stride length is good.   I have reviewed and interpreted the following labs independently    Chemistry      Component Value Date/Time   NA 140 08/25/2020 0838   NA 142 04/02/2017 0000   NA 140 07/19/2016 1305   K 4.5 08/25/2020 0838   K 4.4 07/19/2016 1305   CL 104 08/25/2020 0838   CL 105 02/08/2016 1117   CL 107 03/25/2013 0828   CO2 30 08/25/2020 0838   CO2 26  07/19/2016 1305   BUN 35 (H) 08/25/2020 0838   BUN 21 04/02/2017 0000   BUN 22.8 07/19/2016 1305   CREATININE 1.34 (H) 08/25/2020 0838   CREATININE 1.1 07/19/2016 1305   GLU 105 04/02/2017 0000      Component Value Date/Time   CALCIUM 9.7 08/25/2020 0838   CALCIUM 9.8 07/19/2016 1305   ALKPHOS 58 08/25/2020 0838   ALKPHOS 76 07/19/2016 1305   AST 15 08/25/2020 0838   AST 18 07/19/2016 1305   ALT 4 08/25/2020 0838   ALT 14 07/19/2016 1305   BILITOT 0.5 08/25/2020 0838   BILITOT 0.70 07/19/2016 1305       Lab Results  Component Value Date   WBC 5.6 08/25/2020   HGB 13.6 08/25/2020   HCT 41.6 08/25/2020   MCV 100.5 (H) 08/25/2020   PLT 215 08/25/2020    Lab Results  Component Value Date   TSH 3.726 03/25/2020     Total time spent on today's visit was 30 minutes, including both face-to-face time and nonface-to-face time.  Time included that spent on review of records (prior notes available to me/labs/imaging if pertinent), discussing treatment and goals, answering patient's questions and coordinating care.  Cc:  Copland, [name], MD

## 2020-11-15 ENCOUNTER — Encounter: Payer: Self-pay | Admitting: Hematology & Oncology

## 2020-11-22 ENCOUNTER — Other Ambulatory Visit: Payer: Self-pay

## 2020-11-22 ENCOUNTER — Telehealth: Payer: Self-pay | Admitting: Neurology

## 2020-11-22 ENCOUNTER — Other Ambulatory Visit: Payer: Self-pay | Admitting: Neurology

## 2020-11-22 NOTE — Telephone Encounter (Signed)
Advsied. To call pharmacy.

## 2020-11-22 NOTE — Telephone Encounter (Signed)
Patient states [facility] on [address] called him to let him know that his carbidopa levodopa medication refill was denied. He would like to know if he can get that refilled, he has a follow up scheduled on 11/28/20. He is also checking on his Pramipexole. He states that he has a hard time getting these medications refilled and is asking if he can have refills with the authorization for a few subsequent refills? Patient would like a call back.

## 2020-11-28 ENCOUNTER — Encounter: Payer: Self-pay | Admitting: Neurology

## 2020-11-28 ENCOUNTER — Ambulatory Visit (INDEPENDENT_AMBULATORY_CARE_PROVIDER_SITE_OTHER): Payer: Commercial Managed Care - PPO | Admitting: Neurology

## 2020-11-28 ENCOUNTER — Other Ambulatory Visit: Payer: Self-pay

## 2020-11-28 VITALS — BP 130/80 | HR 78 | Resp 18 | Ht 70.0 in | Wt 189.0 lb

## 2020-11-28 DIAGNOSIS — G20 Parkinson's disease: Secondary | ICD-10-CM | POA: Diagnosis not present

## 2020-11-28 DIAGNOSIS — G249 Dystonia, unspecified: Secondary | ICD-10-CM | POA: Diagnosis not present

## 2020-11-28 MED ORDER — PRAMIPEXOLE DIHYDROCHLORIDE 1 MG PO TABS: 1.0000 mg | ORAL_TABLET | Freq: Three times a day (TID) | ORAL | 2 refills | Status: DC

## 2020-11-28 MED ORDER — CARBIDOPA-LEVODOPA 25-100 MG PO TABS: ORAL_TABLET | ORAL | 2 refills | Status: DC

## 2020-11-28 NOTE — Patient Instructions (Signed)
Parkinsons [employer]   . Online Resources for Power over Parkinson's Group . Local [place] Online Groups  o Power over [employer] Group :   - Power Over Parkinson's Patient Education Group will be Wednesday, December 8th at 2pm via Zoom.   - Upcoming Power over Parkinson's Meetings:  2nd Wednesdays of the month at 2 pm:       January 12th, February 9th - Amy Marriott, PT at [facility] has resumed the lead of this group starting in July.  Contact Amy at [email or web address] if interested in participating in this online group o Parkinson's Care Partners Group:    3rd Mondays, Contact Corwin Levins o Atypical Parkinsonian Patient Group:   4th Wednesdays, Contact Corwin Levins o If you are interested in participating in these online groups with Judson Roch, please contact her directly for how to join those meetings.  Her contact information is [email or web address].  She will send you a link to join the [employer].  (Please note that Corwin Levins , MSW, LCSW, has resigned her position at [facility], but will continue to lead the online groups temporarily) .  Marland Kitchen [place]:  [email or web address] o PD Health at Home continues:  Mindfulness Mondays, Expert Briefing Tuesdays, Wellness Wednesdays, Take Time Thursdays, Fitness Fridays  o [employer]:  (Next one is February 2022, stay tuned) o Please check out their website to sign up for emails and see their full online offerings .  Marland Kitchen [place]:  [email or web address]  o Upcoming Webinar:   Stay tuned for 2022 o Check out additional information on their website to see their full online offerings .  Marland Kitchen [place]:  [email or web address] o Upcoming Webinar:  Stay tuned for 2022 o Care Partner Monthly Meetup.  With Robin Searing [place].  First Tuesday of each month, 2 pm o Check out additional information to Live Well Today on their  website .  Marland Kitchen Parkinson and Movement Disorders (PMD) Alliance:  www.[email or web address] o [employer] Online:  Online Education Events o Sign up for emails, which are sent weekly to give you updates on programming and online offerings .  Marland Kitchen Parkinson's Association of the [place]:  www.[email or web address] o Information on online support groups, online exercises including Yoga, Parkinson's exercises and more-LOTS of information on links to PD resources and online events o Virtual Support Group through Parkinson's Association of the [place]; next one is scheduled for Wednesday, October 26, 2020 at 2 pm. (These are typically scheduled for the 1st Wednesday of the month at 2 pm).  Visit website for details. .  . Additional links for movement activities: o PWR! Moves Classes at [place] RESUMED, at a limited capacity.  We have several openings for Wednesday 10 am and 11 am classes.  Contact Amy Marriott, PT [email or web address] or [phone] if interested o Here is a link to the PWR!Moves classes on Zoom from [place] - Daily Mon-Sat at 10:00. Via Zoom, FREE and open to all.  There is also a link below via Facebook if you use that platform. - [email or web address].si - [email or web address].no o Parkinson's Wellness Recovery (PWR! Moves)  www.pwr4life.org - Info on the PWR! Virtual Experience:  You will have access to our expertise through self-assessment, guided plans that start with the PD-specific fundamentals, educational content, tips, Q&A with an expert, and a growing Art therapist of PD-specific pre-recorded and live exercise classes of varying types and intensity - both physical and cognitive! If that is not enough, we offer 1:1 wellness  consultations (in-person or virtual) to  personalize your PWR! Research scientist (medical).  - Check out the PWR! Move of the month on the [employer] [facility] website:  [email or web address] o [employer] Fridays:  - As part of the PD Health @ Home program, this free video series focuses each week on one aspect of fitness designed to support people living with Parkinson's.  -  HollywoodSale.dk o Dance for PD website is offering free, live-stream classes throughout the week, as well as links to [employer] of classes:  [email or web address] o Transport planner for Parkinson's Class:  [place] is back this Fall!  Free offering for people with Parkinson's and care partners; virtual class this Fall. The class will be Wednesdays 4-5pm beginning 10/13.  Classes will run for 9 weeks 10/13-12/15,.  Register below: o [email or web address]&[email or web address]+[email or web address]  o For more information, contact [phone] or email Ruffin Frederick at [email or web address] o Virtual dance and Pilates for Parkinson's classes: Click on the Community Tab> Parkinson's Movement Initiative Tab.  To register for classes and for more information, visit [email or web address] and click the "community" tab.  o [employer] Parkinson's Cycling Classes  - [facility] [employer]: 1pm on Fridays-Live classes at [facility] [employer] at [email or web address] or [phone]) [facility] [employer]: Virtual Classes Mondays and Thursdays (contact [place] at [email or web address] or [phone]) .  o [employer] - Three levels of classes are offered Tuesdays and Thursdays:  10:30 am,  12 noon & 1:45 pm at [employer]. To observe a class or for  more information, call [phone] or email  [email or web address] . [employer]: o Chief Technology Officer Opportunities:  www.well-[email or web address]/caregiver-education/caregiver-support-group.  You may also contact Vickki Muff at [email or web address] or [phone].   o Caregiver Virtual Event:   Well-Spring is having a [phone], Thursday, December 9th from 6-7 pm - Contact 12-[birth year]-1990 (above) for details o Well-Spring Navigator:  Just1Navigator program, a free service to help individuals and families through the journey of determining care for older adults.  The "Navigator" is a Vickki Muff, Education officer, museum, who will speak with a prospective client and/or loved ones to provide an assessment of the situation and a set of recommendations for a personalized care plan - all free of charge, and whether [facility] [employer] offers the needed service or not. If the need is not a service we provide, we are well-connected with reputable programs in town that we can refer you to.  www.well-springsolutions.org or to speak with the Navigator, call [phone].

## 2020-12-08 ENCOUNTER — Other Ambulatory Visit: Payer: Self-pay

## 2020-12-08 ENCOUNTER — Inpatient Hospital Stay: Payer: Commercial Managed Care - PPO

## 2020-12-08 ENCOUNTER — Inpatient Hospital Stay: Payer: Commercial Managed Care - PPO | Attending: Hematology & Oncology

## 2020-12-08 ENCOUNTER — Encounter: Payer: Self-pay | Admitting: Hematology & Oncology

## 2020-12-08 ENCOUNTER — Inpatient Hospital Stay (HOSPITAL_BASED_OUTPATIENT_CLINIC_OR_DEPARTMENT_OTHER): Payer: Commercial Managed Care - PPO | Admitting: Hematology & Oncology

## 2020-12-08 VITALS — BP 127/87 | HR 87 | Temp 98.4°F | Resp 18 | Wt 185.0 lb

## 2020-12-08 DIAGNOSIS — Z9484 Stem cells transplant status: Secondary | ICD-10-CM | POA: Insufficient documentation

## 2020-12-08 DIAGNOSIS — Z79899 Other long term (current) drug therapy: Secondary | ICD-10-CM | POA: Insufficient documentation

## 2020-12-08 DIAGNOSIS — C8195 Hodgkin lymphoma, unspecified, lymph nodes of inguinal region and lower limb: Secondary | ICD-10-CM

## 2020-12-08 DIAGNOSIS — Z23 Encounter for immunization: Secondary | ICD-10-CM | POA: Diagnosis not present

## 2020-12-08 DIAGNOSIS — Z8571 Personal history of Hodgkin lymphoma: Secondary | ICD-10-CM | POA: Diagnosis present

## 2020-12-08 DIAGNOSIS — Z923 Personal history of irradiation: Secondary | ICD-10-CM | POA: Insufficient documentation

## 2020-12-08 DIAGNOSIS — E032 Hypothyroidism due to medicaments and other exogenous substances: Secondary | ICD-10-CM

## 2020-12-08 DIAGNOSIS — G20 Parkinson's disease: Secondary | ICD-10-CM | POA: Diagnosis not present

## 2020-12-08 LAB — CMP (CANCER CENTER ONLY)
ALT: 5 U/L (ref 0–44)
AST: 17 U/L (ref 15–41)
Albumin: 4.3 g/dL (ref 3.5–5.0)
Alkaline Phosphatase: 64 U/L (ref 38–126)
Anion gap: 7 (ref 5–15)
BUN: 29 mg/dL — ABNORMAL HIGH (ref 6–20)
CO2: 27 mmol/L (ref 22–32)
Calcium: 9.6 mg/dL (ref 8.9–10.3)
Chloride: 104 mmol/L (ref 98–111)
Creatinine: 1.14 mg/dL (ref 0.61–1.24)
GFR, Estimated: >60 mL/min (ref >60)
Glucose, Bld: 103 mg/dL — ABNORMAL HIGH (ref 70–99)
Potassium: 4.6 mmol/L (ref 3.5–5.1)
Sodium: 138 mmol/L (ref 135–145)
Total Bilirubin: 0.8 mg/dL (ref 0.3–1.2)
Total Protein: 6.5 g/dL (ref 6.5–8.1)

## 2020-12-08 LAB — CBC WITH DIFFERENTIAL (CANCER CENTER ONLY)
Abs Immature Granulocytes: 0.07 10*3/uL (ref 0.00–0.07)
Basophils Absolute: 0 10*3/uL (ref 0.0–0.1)
Basophils Relative: 1 %
Eosinophils Absolute: 0.1 10*3/uL (ref 0.0–0.5)
Eosinophils Relative: 1 %
HCT: 41.8 % (ref 39.0–52.0)
Hemoglobin: 13.8 g/dL (ref 13.0–17.0)
Immature Granulocytes: 1 %
Lymphocytes Relative: 13 %
Lymphs Abs: 0.9 10*3/uL (ref 0.7–4.0)
MCH: 31.6 pg (ref 26.0–34.0)
MCHC: 33 g/dL (ref 30.0–36.0)
MCV: 95.7 fL (ref 80.0–100.0)
Monocytes Absolute: 0.8 10*3/uL (ref 0.1–1.0)
Monocytes Relative: 12 %
Neutro Abs: 4.8 10*3/uL (ref 1.7–7.7)
Neutrophils Relative %: 72 %
Platelet Count: 243 10*3/uL (ref 150–400)
RBC: 4.37 MIL/uL (ref 4.22–5.81)
RDW: 12.7 % (ref 11.5–15.5)
WBC Count: 6.6 10*3/uL (ref 4.0–10.5)
nRBC: 0 % (ref 0.0–0.2)

## 2020-12-08 LAB — TSH: TSH: 3.484 u[IU]/mL (ref 0.320–4.118)

## 2020-12-08 LAB — LACTATE DEHYDROGENASE: LDH: 177 U/L (ref 98–192)

## 2020-12-08 MED ORDER — SODIUM CHLORIDE 0.9 % IV SOLN
Freq: Once | INTRAVENOUS | Status: AC
  Filled 2020-12-08: qty 250

## 2020-12-08 MED ORDER — SODIUM CHLORIDE 0.9 % IV SOLN
480.0000 mg | Freq: Once | INTRAVENOUS | Status: AC
  Administered 2020-12-08: 480 mg via INTRAVENOUS
  Filled 2020-12-08: qty 48

## 2020-12-08 NOTE — Progress Notes (Signed)
Hematology and Oncology Follow Up Visit  Brian [name] [id number] 09-30-1961 60 y.o. 12/08/2020   Principle Diagnosis:  Recurrent Hodgkin's Disease -  S/p CAR-T therapy  TIA-resolved  Current Therapy:  Nivolumab q 3 month -- s/p cycle #17       Interim History:  Brian [name] is in for follow-up.  We see him every 3 months.  Since we last saw him, he has been doing quite well.  He did had a nice holiday season.  He has had no problems with worsening of his Parkinson's disease.  He is working out.  He is trying exercise.  He is taking medication for the Parkinson's.  He has had no problems with fever.  He has had no problems with cough or shortness of breath.  There is been no issues with the coronavirus.  He has had no problems with nausea and vomiting.  There has been no change in bowel or bladder habits.  He has had no back discomfort.  He did receive an epidural injection to the lumbar spine for a herniated disc.  This helped quite a bit.  Overall, I would say his performance status is ECOG 1.    Medications:  Current Outpatient Medications:  .  aspirin EC 81 MG tablet, Take 81 mg by mouth daily. Swallow whole., Disp: , Rfl:  .  carbidopa-levodopa (SINEMET IR) 25-100 MG tablet, TAKE 2 TABLETS AT 7 AM, 2 TABLETS AT 11 AM, AND 1 TABLET AT 4 PM, Disp: 450 tablet, Rfl: 2 .  Cholecalciferol (VITAMIN D3) 125 MCG (5000 UT) CAPS, Take 5,000 Units by mouth 4 (four) times a week., Disp: , Rfl:  .  cyclobenzaprine (FLEXERIL) 10 MG tablet, Take 1 tablet (10 mg total) by mouth at bedtime., Disp: 30 tablet, Rfl: 0 .  HYDROcodone-acetaminophen (NORCO/VICODIN) 5-325 MG tablet, Take 1-2 tablets by mouth every 8 (eight) hours as needed., Disp: 20 tablet, Rfl: 0 .  olmesartan (BENICAR) 20 MG tablet, Take 20 mg by mouth daily., Disp: , Rfl:  .  pramipexole (MIRAPEX) 1 MG tablet, Take 1 tablet (1 mg total) by mouth 3 (three) times daily., Disp: 270 tablet, Rfl: 2 .  sildenafil (REVATIO) 20 MG tablet,  TAKE 1 TO 5 TABLETS BY MOUTH DAILY AS NEEDED PRIOR TO INTERCOURSE, Disp: 10 tablet, Rfl: 0 .  Vitamin D, Ergocalciferol, (DRISDOL) 1.25 MG (50000 UNIT) CAPS capsule, Take 50,000 Units by mouth every 7 (seven) days., Disp: , Rfl:   Allergies: No Known Allergies  Past Medical History, Surgical history, Social history, and Family History were reviewed and updated.  Review of Systems: Review of Systems  Neurological: Positive for tremors.  All other systems reviewed and are negative.    Physical Exam:  vitals were not taken for this visit.   Physical Exam Vitals reviewed.  HENT:     Head: Normocephalic and atraumatic.  Eyes:     Pupils: Pupils are equal, round, and reactive to light.  Cardiovascular:     Rate and Rhythm: Normal rate and regular rhythm.     Heart sounds: Normal heart sounds.  Pulmonary:     Effort: Pulmonary effort is normal.     Breath sounds: Normal breath sounds.  Abdominal:     General: Bowel sounds are normal.     Palpations: Abdomen is soft.  Musculoskeletal:        General: No tenderness or deformity. Normal range of motion.     Cervical back: Normal range of motion.  Lymphadenopathy:  Cervical: No cervical adenopathy.  Skin:    General: Skin is warm and dry.     Findings: No erythema or rash.  Neurological:     Mental Status: He is alert and oriented to person, place, and time.  Psychiatric:        Behavior: Behavior normal.        Thought Content: Thought content normal.        Judgment: Judgment normal.     Lab Results  Component Value Date   WBC 6.6 12/08/2020   HGB 13.8 12/08/2020   HCT 41.8 12/08/2020   MCV 95.7 12/08/2020   PLT 243 12/08/2020     Chemistry      Component Value Date/Time   NA 138 12/08/2020 0934   NA 142 04/02/2017 0000   NA 140 07/19/2016 1305   K 4.6 12/08/2020 0934   K 4.4 07/19/2016 1305   CL 104 12/08/2020 0934   CL 105 02/08/2016 1117   CL 107 03/25/2013 0828   CO2 27 12/08/2020 0934   CO2 26  07/19/2016 1305   BUN 29 (H) 12/08/2020 0934   BUN 21 04/02/2017 0000   BUN 22.8 07/19/2016 1305   CREATININE 1.14 12/08/2020 0934   CREATININE 1.1 07/19/2016 1305   GLU 105 04/02/2017 0000      Component Value Date/Time   CALCIUM 9.6 12/08/2020 0934   CALCIUM 9.8 07/19/2016 1305   ALKPHOS 64 12/08/2020 0934   ALKPHOS 76 07/19/2016 1305   AST 17 12/08/2020 0934   AST 18 07/19/2016 1305   ALT 5 12/08/2020 0934   ALT 14 07/19/2016 1305   BILITOT 0.8 12/08/2020 0934   BILITOT 0.70 07/19/2016 1305      Impression and Plan: Brian [name] is a 60 year old gentleman. gentleman.  He has history of recurrent Hodgkin's disease. 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 He then had a recurrence after this.  He did undergo CAR[facility]T therapy at [facility].  I believe he had this in April 2017.  He did well with this.  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  This was proven by biopsy.  He had radiation therapy for this.  I think the real question now is when or if we can stop the nivolumab.  He has had 17 cycles to date.  This will be his 18th cycle.  It would be nice if we could stop the nivolumab.  There just is no data out there that I can remember or see that can tell us that we can stop the nivolumab.  I am so glad that he does have a nice lady "friend" that really is with him and that really is making his life a lot better.  He totally deserves it.  We will get him back in 3 months.    [name], MD 1/13/202210:35 AM

## 2020-12-08 NOTE — Patient Instructions (Signed)
Nivolumab injection What is this medicine? NIVOLUMAB (nye VOL ue mab) is a monoclonal antibody. It treats certain types of cancer. Some of the cancers treated are colon cancer, head and neck cancer, Hodgkin lymphoma, lung cancer, and melanoma. This medicine may be used for other purposes; ask your health care provider or pharmacist if you have questions. COMMON BRAND NAME(S): Opdivo What should I tell my health care provider before I take this medicine? They need to know if you have any of these conditions:  autoimmune diseases like Crohn's disease, ulcerative colitis, or lupus  have had or planning to have an allogeneic stem cell transplant (uses someone else's stem cells)  history of chest radiation  history of organ transplant  nervous system problems like myasthenia gravis or Guillain-Barre syndrome  an unusual or allergic reaction to nivolumab, other medicines, foods, dyes, or preservatives  pregnant or trying to get pregnant  breast-feeding How should I use this medicine? This medicine is for infusion into a vein. It is given by a health care professional in a hospital or clinic setting. A special MedGuide will be given to you before each treatment. Be sure to read this information carefully each time. Talk to your pediatrician regarding the use of this medicine in children. While this drug may be prescribed for children as young as 12 years for selected conditions, precautions do apply. Overdosage: If you think you have taken too much of this medicine contact a poison control center or emergency room at once. NOTE: This medicine is only for you. Do not share this medicine with others. What if I miss a dose? It is important not to miss your dose. Call your doctor or health care professional if you are unable to keep an appointment. What may interact with this medicine? Interactions have not been studied. This list may not describe all possible interactions. Give your health  care provider a list of all the medicines, herbs, non-prescription drugs, or dietary supplements you use. Also tell them if you smoke, drink alcohol, or use illegal drugs. Some items may interact with your medicine. What should I watch for while using this medicine? This drug may make you feel generally unwell. Continue your course of treatment even though you feel ill unless your doctor tells you to stop. You may need blood work done while you are taking this medicine. Do not become pregnant while taking this medicine or for 5 months after stopping it. Women should inform their doctor if they wish to become pregnant or think they might be pregnant. There is a potential for serious side effects to an unborn child. Talk to your health care professional or pharmacist for more information. Do not breast-feed an infant while taking this medicine or for 5 months after stopping it. What side effects may I notice from receiving this medicine? Side effects that you should report to your doctor or health care professional as soon as possible:  allergic reactions like skin rash, itching or hives, swelling of the face, lips, or tongue  breathing problems  blood in the urine  bloody or watery diarrhea or black, tarry stools  changes in emotions or moods  changes in vision  chest pain  cough  dizziness  feeling faint or lightheaded, falls  fever, chills  headache with fever, neck stiffness, confusion, loss of memory, sensitivity to light, hallucination, loss of contact with reality, or seizures  joint pain  mouth sores  redness, blistering, peeling or loosening of the skin, including inside the   mouth  severe muscle pain or weakness  signs and symptoms of high blood sugar such as dizziness; dry mouth; dry skin; fruity breath; nausea; stomach pain; increased hunger or thirst; increased urination  signs and symptoms of kidney injury like trouble passing urine or change in the amount of  urine  signs and symptoms of liver injury like dark yellow or brown urine; general ill feeling or flu-like symptoms; light-colored stools; loss of appetite; nausea; right upper belly pain; unusually weak or tired; yellowing of the eyes or skin  swelling of the ankles, feet, hands  trouble passing urine or change in the amount of urine  unusually weak or tired  weight gain or loss Side effects that usually do not require medical attention (report to your doctor or health care professional if they continue or are bothersome):  bone pain  constipation  decreased appetite  diarrhea  muscle pain  nausea, vomiting  tiredness This list may not describe all possible side effects. Call your doctor for medical advice about side effects. You may report side effects to FDA at 1-800-FDA-1088. Where should I keep my medicine? This drug is given in a hospital or clinic and will not be stored at home. NOTE: This sheet is a summary. It may not cover all possible information. If you have questions about this medicine, talk to your doctor, pharmacist, or health care provider.  2021 Elsevier/Gold Standard (2020-03-16 10:08:25)  

## 2020-12-18 ENCOUNTER — Encounter: Payer: Self-pay | Admitting: Family Medicine

## 2020-12-18 ENCOUNTER — Encounter: Payer: Self-pay | Admitting: Hematology & Oncology

## 2020-12-19 ENCOUNTER — Telehealth: Payer: Self-pay | Admitting: Neurology

## 2020-12-19 ENCOUNTER — Other Ambulatory Visit: Payer: Self-pay | Admitting: Neurology

## 2020-12-19 MED ORDER — CARBIDOPA-LEVODOPA 25-100 MG PO TABS: ORAL_TABLET | ORAL | 3 refills | Status: DC

## 2020-12-19 NOTE — Telephone Encounter (Signed)
Called [facility] but voicemail states the line is down. Left message for medication refill for patient.  Spoke with patient and informed him that rx has been called in to his pharmacy. He states he would like to get 90 day rx's. Advised him to request 78 day supply from the pharmacy and we will send it over. He voiced understanding.

## 2020-12-19 NOTE — Telephone Encounter (Signed)
Patient states he received a phone call from his pharmacy notifying him that his refill for carbidopa levodopa was denied. He uses [facility] on [address] in [place]. He thought that the last time he had his visit with Dr Tat she had given him enough refills for 6 months, he states he has been dealing with this every month come time for a refill and it's very frustrating. Please call. Pt is aware our electronic Rx system is down right now and that might be the cause for the delay.

## 2020-12-20 ENCOUNTER — Other Ambulatory Visit: Payer: Self-pay | Admitting: Neurology

## 2020-12-22 ENCOUNTER — Telehealth: Payer: Self-pay | Admitting: Family Medicine

## 2020-12-22 NOTE — Telephone Encounter (Signed)
Patient e-mailed a physical form. Form was place on Dr. Lorelei Pont box.

## 2020-12-23 NOTE — Telephone Encounter (Signed)
Received form from front office tray. Placed in provider folder for signature.

## 2020-12-26 NOTE — Telephone Encounter (Signed)
Patient called to check the status of form. Please e-mail form back to [email or web address]

## 2020-12-27 NOTE — Telephone Encounter (Signed)
Form has been signed and emailed to patient.

## 2021-01-04 ENCOUNTER — Telehealth: Payer: Self-pay | Admitting: Neurology

## 2021-01-04 NOTE — Telephone Encounter (Signed)
Patient wants to know if this will take weeks or months for him to get better. .   Patient states he notices the symptoms get worse at night.

## 2021-01-04 NOTE — Telephone Encounter (Signed)
I don't know.  Its different for everyone.  Hes young and its usually faster for young people to recover but I don't have a good answer for this

## 2021-01-04 NOTE — Telephone Encounter (Signed)
Patient notified and voiced understanding.

## 2021-01-04 NOTE — Telephone Encounter (Signed)
No, this will get likely slowly better.  Covid recovery can bring out Parkinsons Disease symptoms and Parkinsons Disease patients can take longer to recover but we don't change Parkinsons Disease meds during this time generally b/c patients generally just slowly get better.

## 2021-01-04 NOTE — Telephone Encounter (Signed)
Patient called in wanting to get some advice about how him having Covid has affected him. He has been having "frozen feet" and it's been happening almost every evening. It's hard for him to walk across the room. During the day seems to be fine. He is wanting to find out if this is a permanent change or if it may go away? It is really worrying him.

## 2021-01-11 ENCOUNTER — Telehealth: Payer: Self-pay | Admitting: Neurology

## 2021-01-11 NOTE — Telephone Encounter (Signed)
Patient called in wanting to see if Dr. Carles Collet knows about how long it will take for his "frozen feet" to go away?

## 2021-01-11 NOTE — Telephone Encounter (Signed)
Spoke with patient who states he is having freezing at night since he has had covid.   He thanked me for calling and voiced understanding.

## 2021-01-11 NOTE — Telephone Encounter (Signed)
See previous message.  If he means, post covid, then no.  I don't know.  Recovering post covid can take longer with Parkinsons Disease.  Freezing is also very common with Parkinsons Disease and a sx he has had in the past as well.

## 2021-01-23 ENCOUNTER — Telehealth: Payer: Self-pay | Admitting: Neurology

## 2021-01-23 NOTE — Telephone Encounter (Signed)
Patient called to ask if he could be seen sooner and be scheduled as a VV. Dr Tat approved the VV and said that it was not urgent, was able to get pt sch for 3/31 and added his appt to the wait list. Patient was unhappy that he had to wait a month to be seen. He asked that Dr Tat call him personally so he could speak with her because his situation is urgent in his eyes, stating that he's been falling all over his house for the past month because it feels like his feet are sticking to the floor. I let pt know that I would send a message back but that Dr Tat may not be able to call him herself as she is very busy and has a full schedule. He asked if she could just call him at the end of the day just for a few minutes, and that it didn't need to be something on the schedule. I let him know that we can't do that, but I would send his message back to let Dr Tat know that in his view, his situation should be deemed as urgent.

## 2021-01-23 NOTE — Telephone Encounter (Signed)
Would he be willing to get a PT appt started while waiting for the visit here?  Did he ever get neurosx appt for his back?

## 2021-01-24 NOTE — Telephone Encounter (Signed)
Spoke with patient and informed patient that we have tried to accommodate him by moving his appointment from July to March. Advised him that we have put him on the cancellation list and that's the best we can do.   He states he stopped PT once he got Covid and never returned. He states he needs to call them.   Patient states he did see the neurosx and got 2 epidurals. He states the pain is better. He states he would like to speak with Dr Tat on the phone real quick I advised him that we can not do that and the only way to get that done is to have a virtual visit. He voiced understanding and said he will take what he take what he can get.

## 2021-01-24 NOTE — Telephone Encounter (Signed)
Pt was moved up on the schedule from July to March.  He is on the cx list.  We can get him in some PT before I see him and we likely will get him moved up even more.

## 2021-01-25 ENCOUNTER — Encounter: Payer: Self-pay | Admitting: Hematology & Oncology

## 2021-01-30 NOTE — Progress Notes (Signed)
Virtual Visit Via Video   The purpose of this virtual visit is to provide medical care while limiting exposure to the novel coronavirus.    Consent was obtained for video visit:  Yes.   Answered questions that patient had about telehealth interaction:  Yes.   I discussed the limitations, risks, security and privacy concerns of performing an evaluation and management service by telemedicine. I also discussed with the patient that there may be a patient responsible charge related to this service. The patient expressed understanding and agreed to proceed.  Pt location: Home Physician Location: office Name of referring provider:  Copland, [name], MD I connected with Brian [name] at patients initiation/request on 02/01/2021 at  9:45 AM EST by video enabled telemedicine application and verified that I am speaking with the correct person using two identifiers. Pt MRN:  [id number] Pt DOB:  [birth year] Video Participants:  Brian [name];    Assessment/Plan:   1.  Parkinsons Disease             -We will increase carbidopa/levodopa 25/100 and take 2 tablets at 7 AM, 2 tablets at 11 AM, 1 tablet at 3 PM, 1 tablet at 7 PM.  -Add carbidopa/levodopa 50/200 at bedtime. -Discussed changing his pramipexole to rotigotine patch.  It gives 24 hour/day coverage.  Decided to hold on that for now, but it is certainly an option in the future.  For now, he will continue pramipexole, 1 mg 3 times per day.  -Kynmobi and Inbrija are options in the future.  -DBS is an option in the future.  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  3. Hodgkin's lymphoma -Doing well from that standpoint. Following with oncology.  4. Renal insufficiency -Creatinine steadily been rising over the last year. No need to alter pramipexole dosing at this point, but it is something we will need to monitor.   5.  Lightheadedness/dizziness -monitoring.  On benicar  6.  Lumbar radiculopathy             -This is most certainly the source of his back and hip pain.              -he is following with neurosx and had epidurals with improvement in his back pain.  He is holding on the microdiscectomy.  Subjective   Patient seen today in follow-up for Parkinson's disease.  Patient had Covid diagnosed around January 20.  Following that, he reported that he was having more freezing.  I initially did not want to change his medication because most post Covid Parkinson's symptoms resolved spontaneously.  He has had a fall into the dining room table - hurt left arm but no fx.  The sx's occur late evening and early morning with freezing.  Does pretty well in the day.  Worries about going out socially. He was seeing neurosurgery and states that he got 2 epidural since last visit and the back pain is better.  Current movement d/o meds:  Carbidopa/levodopa 25/100, 2 tablets in the morning, 2 in the afternoon and 1 in the evening (7am/noon/5pm) Pramipexole 1 mg 3 times per day.  PREVIOUS MEDICATIONS:pramipexole; levodopa;(have discussed rotigotine because of wearing off but he decided to keep medication as is); carbidopa/levodopa 50/200 (tolerated fine, we just d/c because not needed)   Current Outpatient Medications on File Prior to Visit  Medication Sig Dispense Refill  . aspirin EC 81 MG tablet Take 81 mg by mouth  daily. Swallow whole.    . Cholecalciferol (VITAMIN D3) 125 MCG (5000 UT) CAPS Take 5,000 Units by mouth 4 (four) times a week.    . nivolumab (OPDIVO) 40 MG/4ML SOLN chemo injection Inject into the vein.    Marland Kitchen olmesartan (BENICAR) 20 MG tablet Take 10 mg by mouth daily.    . pramipexole (MIRAPEX) 1 MG tablet Take 1 tablet (1 mg total) by mouth 3 (three) times daily. 270 tablet 2  . Vitamin D, Ergocalciferol, (DRISDOL) 1.25 MG (50000 UNIT) CAPS capsule Take 50,000 Units by mouth every 7 (seven)  days.    . Multiple Vitamins-Minerals (CENTRUM SILVER 50+MEN PO)  (Patient not taking: Reported on 02/01/2021)     No current facility-administered medications on file prior to visit.     Objective   Vitals:   02/01/21 0921  Weight: 185 lb (83.9 kg)  Height: 5\' 10"  (1.778 m)   GEN:  The patient appears stated age and is in NAD.  Neurological examination:  Orientation: The patient is alert and oriented x3. Cranial nerves: There is R facial droop. There is mild facial hypomimia.  The speech is fluent and clear. Soft palate rises symmetrically and there is no tongue deviation. Hearing is intact to conversational tone. Motor: Strength is at least antigravity x 4.   Shoulder shrug is equal and symmetric.  There is no pronator drift.  Movement examination: Tone: unable Abnormal movements: none seen but hands not seen well at rest Coordination:  There is no decremation with RAM's Gait and Station:  The patient's stride length is good but he admits I am seeing him in the AM after meds and usually does well this time of day.      Follow up Instructions      -I discussed the assessment and treatment plan with the patient. The patient was provided an opportunity to ask questions and all were answered. The patient agreed with the plan and demonstrated an understanding of the instructions.   The patient was advised to call back or seek an in-person evaluation if the symptoms worsen or if the condition fails to improve as anticipated.    Total time spent on today's visit was 20 minutes, including both face-to-face time and nonface-to-face time.  Time included that spent on review of records (prior notes available to me/labs/imaging if pertinent), discussing treatment and goals, answering patient's questions and coordinating care.   Alonza Bogus, DO

## 2021-02-01 ENCOUNTER — Other Ambulatory Visit: Payer: Self-pay

## 2021-02-01 ENCOUNTER — Telehealth: Payer: Self-pay | Admitting: Neurology

## 2021-02-01 ENCOUNTER — Telehealth (INDEPENDENT_AMBULATORY_CARE_PROVIDER_SITE_OTHER): Payer: Commercial Managed Care - PPO | Admitting: Neurology

## 2021-02-01 ENCOUNTER — Encounter: Payer: Self-pay | Admitting: Neurology

## 2021-02-01 VITALS — Ht 70.0 in | Wt 185.0 lb

## 2021-02-01 DIAGNOSIS — G20 Parkinson's disease: Secondary | ICD-10-CM | POA: Diagnosis not present

## 2021-02-01 MED ORDER — CARBIDOPA-LEVODOPA ER 50-200 MG PO TBCR: 1.0000 | EXTENDED_RELEASE_TABLET | Freq: Every day | ORAL | 1 refills | Status: DC

## 2021-02-01 MED ORDER — CARBIDOPA-LEVODOPA 25-100 MG PO TABS: ORAL_TABLET | ORAL | 1 refills | Status: DC

## 2021-02-01 NOTE — Telephone Encounter (Signed)
Spoke with patient and he states he received a text from the pharmacy stating that he had refills at the pharmacy.   I spoke with patient and informed him that Dr Tat sent him a [employer]. He read the message then said that he did not have any other questions.

## 2021-02-01 NOTE — Telephone Encounter (Signed)
Patient had a visit today with  Tat and he has some questions about his Medication  Please call

## 2021-02-17 ENCOUNTER — Telehealth: Payer: Self-pay | Admitting: *Deleted

## 2021-02-17 ENCOUNTER — Encounter: Payer: Self-pay | Admitting: Hematology & Oncology

## 2021-02-20 ENCOUNTER — Other Ambulatory Visit: Payer: Self-pay | Admitting: *Deleted

## 2021-02-20 ENCOUNTER — Encounter: Payer: Self-pay | Admitting: Family Medicine

## 2021-02-21 NOTE — Telephone Encounter (Signed)
Order entered; routed to MD for signature.

## 2021-02-22 NOTE — Progress Notes (Signed)
[facility] [address], [place], [place] [address] [phone]  Date:  02/23/2021   Name:  Brian [name]   DOB:  February 01, 1961   MRN:  [id number]  PCP:  Darreld Mclean, MD    Chief Complaint: Fall and Parkinson's Disease   History of Present Illness:  Brian [name] is a 60 y.o. 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 Last seen by myself in November  He had covid 19 in January of this year   He sent me the following mychart message recently:   Hi. I have Parkinson's and sometimes don't move around too smoothly. Example: about 6 weeks ago I took a nasty fall at home, landing with my full weight on my left arm/side. The nasty bruises that appeared have healed, but the pain has not and I'm wondering if there may be some sort of internal injury that isn't obvious from looking at it. I guess the impact would be considered blunt force trauma, and I know that you don't have to actually break something for there to be pain. Anyway, Is it worth an x-ray or any sort of examination or should I just give it time? I'm unable to lift weights like I used to and putting even moderate pressure on that arm is quite painful. Thanks.   Visit with neurology earlier this month:  -We will increase carbidopa/levodopa 25/100 and take 2 tablets at 7 AM, 2 tablets at 11 AM, 1 tablet at 3 PM, 1 tablet at 7 PM.             -Add carbidopa/levodopa 50/200 at bedtime. -Discussed changing his pramipexole to rotigotine patch.  It gives 24 hour/day coverage.  Decided to hold on that for now, but it is certainly an option in the future.  For now, he will continue pramipexole, 1 mg 3 times per day.             -Kynmobi and Inbrija  are options in the future.             -DBS is an option in the future. 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 3. Hodgkin's lymphoma -Doing well from that standpoint. Following with oncology. 4. Renal insufficiency -Creatinine steadily been rising over the last year. No need to alter pramipexole dosing at this point, but it is something we will need to monitor.  5. Lightheadedness/dizziness -monitoring. On benicar 6. Lumbar radiculopathy -This is most certainly the source of his back and hip pain. -he is following with neurosx and had epidurals with improvement in his back pain.  He is holding on the microdiscectomy.   In early February he was walking and tripped over his feet, and fell onto his left arm.  He notes that sometimes he will be walking, and without warning he would develop "frozen feet" this can cause him to trip or fall He fell with his left arm under him-he did not think anything was broken. He had some bruising on his left arm.  The bruising has now resolved, but he continues to have some pain in his left biceps He also notes pain when he is doing exercises at the gym, in the  distribution of the humerus  He does notice a small bulging area along his left side which is concerned to move it.  He notes that he has gained a bit of weight recently-his girlfriend is taking good care of him  Wt Readings from Last 3 Encounters:  02/23/21 183 lb (83 kg)  02/01/21 185 lb (83.9 kg)  12/08/20 185 lb (83.9 kg)    Patient Active Problem List   Diagnosis Date Noted  . Goals of care, counseling/discussion 09/25/2018  . Mass of right chest wall 09/18/2018  . PD (Parkinson's disease) ([place]) 11/27/2016  . Lymphadenopathy 07/28/2015  . Mediastinal adenopathy 07/14/2015  . Aphasia 03/04/2015  . Slurred speech 03/04/2015  . Pleural mass  05/31/2014  . Abdominal pain, epigastric 02/02/2014  . Weight loss 08/12/2012  . H/O Bell's palsy 08/12/2012  . Hodgkin's disease 08/09/2011    Past Medical History:  Diagnosis Date  . Anxiety   . Dysrhythmia    past hx pvc  . Goals of care, counseling/discussion 09/25/2018  . H/O autologous stem cell transplant ([place])    may 2014  at [facility]  . History of Bell's palsy    2009  RIGHT SIDE--  HAS 80% FUNCTION / PT STATES A LITTLE ASYMETRICAL AND EFFECTS MOUTH  . History of radiation therapy 10/18/17-11/05/17   sprine T1 26 Gy in 13 fractions, spine boost 10 Gy in 5 fractions  . Hodgkin's disease, nodular sclerosis, of inguinal region/lower limb ([place]) ONOLOGIST--  DR ENNEVER AND A [facility]     [place] CHEMO 2013/  AUTOLOGUS STEM CELL TRANSPLANT MAY 2014 AT [facility]  . Mass of right chest wall 09/18/2018  . Mass of right inguinal region   . PVC (premature ventricular contraction)    "benign"  . TIA (transient ischemic attack) 02/2015   "probable TIA"  . Wears contact lenses     Past Surgical History:  Procedure Laterality Date  . AXILLARY LYMPH NODE BIOPSY Left 02/02/2013   Procedure: NEEDLE LOCALIZED AXILLARY LYMPH NODE BIOPSY;  Surgeon: Haywood Lasso, MD;  Location: [place];  Service: General;  Laterality: Left;  . BONE MARROW BIOPSY  08/2012  . CYST REMOVAL NECK Left 1980  . LEFT INGUINAL LYMPH NODE BX  09-09-2012  . LYMPH NODE BIOPSY N/A 07/28/2015   Procedure: LYMPH NODE BIOPSY;  Surgeon: Melrose Nakayama, MD;  Location: [place];  Service: Thoracic;  Laterality: N/A;  . MASS EXCISION Right 09/19/2018   Procedure: EXCISION OF RIGHT CHEST WALL MASS ERAS PATHWAY;  Surgeon: Fanny Skates, MD;  Location: [place];  Service: General;  Laterality: Right;  . NODE DISSECTION Right 07/28/2015   Procedure: NODE DISSECTION;  Surgeon: Melrose Nakayama, MD;  Location: [place];  Service: Thoracic;  Laterality: Right;  . PLEURA BIOPSY Left 05/31/2014  . REMOVAL  RIGHT INGUINAL LYMPH NODES  08-16-2011  . SCROTAL EXPLORATION Right 01/04/2014   Procedure: SCROTUM EXPLORATION   INGUINAL , EXCISION OF CYSTIC MASS OF RIGHT SPERMATIC CORD, WITH FROZEN SECTION;  Surgeon: Franchot Gallo, MD;  Location: [facility];  Service: Urology;  Laterality: Right;  . TRANSTHORACIC ECHOCARDIOGRAM  03-18-2013   MILD LVH/  EF 55-60%  . VIDEO ASSISTED THORACOSCOPY Left 05/31/2014   Procedure: LEFT VIDEO ASSISTED THORACOSCOPY, PLEURAL BIOPSY;  Surgeon: Melrose Nakayama, MD;  Location: [place];  Service: Thoracic;  Laterality: Left;  Marland Kitchen VIDEO ASSISTED THORACOSCOPY Right 07/28/2015   Procedure: RIGHT VIDEO ASSISTED THORACOSCOPY;  Surgeon: Melrose Nakayama, MD;  [facility];  Service: Thoracic;  Laterality: Right;  Marland Kitchen VIDEO ASSISTED THORACOSCOPY (VATS)/ LYMPH NODE SAMPLING Right 07/28/2015  . VIDEO ASSISTED THORACOSCOPY (VATS)/WEDGE RESECTION  05/31/2014  . VIDEO BRONCHOSCOPY WITH ENDOBRONCHIAL ULTRASOUND  07/28/2015  . VIDEO BRONCHOSCOPY WITH ENDOBRONCHIAL ULTRASOUND N/A 07/28/2015   Procedure: VIDEO BRONCHOSCOPY WITH ENDOBRONCHIAL ULTRASOUND;  Surgeon: Melrose Nakayama, MD;  Location: [place];  Service: Thoracic;  Laterality: N/A;    Social History   Tobacco Use  . Smoking status: Never Smoker  . Smokeless tobacco: Never Used  Vaping Use  . Vaping Use: Never used  Substance Use Topics  . Alcohol use: Yes    Alcohol/week: 14.0 standard drinks    Types: 7 Cans of beer, 7 Glasses of wine per week  . Drug use: No    Family History  Problem Relation Age of Onset  . Cancer Mother 25       ovarian  . Ovarian cancer Mother 17  . Heart disease Father   . Stroke Father   . Cancer Brother        testicular  . Hypertension Brother   . Healthy Son   . Healthy Son   . Hypertension Brother   . Other Brother        CMT  . Hypertension Brother     No Known Allergies  Medication list has been reviewed and updated.  Current Outpatient Medications on  File Prior to Visit  Medication Sig Dispense Refill  . aspirin EC 81 MG tablet Take 81 mg by mouth daily. Swallow whole.    . carbidopa-levodopa (SINEMET CR) 50-200 MG tablet Take 1 tablet by mouth at bedtime. 90 tablet 1  . carbidopa-levodopa (SINEMET IR) 25-100 MG tablet 2 tablets at 7 AM, 2 tablets at 11 AM, 1 tablet at 3 PM, 1 tablet at 7 PM 540 tablet 1  . nivolumab (OPDIVO) 40 MG/4ML SOLN chemo injection Inject into the vein.    Marland Kitchen olmesartan (BENICAR) 5 MG tablet Take 10 mg by mouth daily.    . pramipexole (MIRAPEX) 1 MG tablet Take 1 tablet (1 mg total) by mouth 3 (three) times daily. 270 tablet 2  . Vitamin D, Ergocalciferol, (DRISDOL) 1.25 MG (50000 UNIT) CAPS capsule Take 50,000 Units by mouth every 7 (seven) days.     No current facility-administered medications on file prior to visit.    Review of Systems:  As per HPI- otherwise negative.   Physical Examination: Vitals:   02/23/21 1318  BP: 126/80  Pulse: 86  Resp: 17  SpO2: 98%   Vitals:   02/23/21 1318  Weight: 183 lb (83 kg)  Height: 5' 10"  (1.778 m)   Body mass index is 26.26 kg/m. Ideal Body Weight: Weight in (lb) to have BMI = 25: 173.9  GEN: no acute distress.  Normal weight, looks well  HEENT: Atraumatic, Normocephalic.  Ears and Nose: No external deformity. CV: RRR, No M/G/R. No JVD. No thrill. No extra heart sounds. PULM: CTA B, no wheezes, crackles, rhonchi. No retractions. No resp. distress. No accessory muscle use. EXTR: No c/c/e PSYCH: Normally interactive. Conversant.  Points out an area of concern along his left side.  This appears to be just normal fat accumulation which is restricted in shape by a scar from previous surgery for cancer treatment.  Offered to do an ultrasound, for the time being he declines Normal biceps, triceps, deltoid function of both upper extremities.  He has some minor tenderness over the left humerus  Assessment and Plan: PD (Parkinson's disease) ([place])  Hodgkin's  disease  Left arm pain - Plan: DG Humerus Left  Fat deposits  Following up today after a fall which occurred at 6 weeks ago.  He went down hard on his left arm, did not think anything was majorly injured but he continues to have pain.  We will obtain a film of his humerus today to be sure there is no occult fracture.  Assuming this is okay, I encouraged him to take it easy at the gym until his pain resolves  Parkinson's disease is managed by neurology, relatively stable  He seems to have a benign fat deposit on his left side, I explained that this is probably only noticeable because of the old scar in this area which restricts fat deposition into an unusual shape  This visit occurred during the SARS-CoV-2 public health emergency.  Safety protocols were in place, including screening questions prior to the visit, additional usage of staff PPE, and extensive cleaning of exam room while observing appropriate contact time as indicated for disinfecting solutions.    Signed Lamar Blinks, MD

## 2021-02-23 ENCOUNTER — Telehealth: Payer: Commercial Managed Care - PPO | Admitting: Neurology

## 2021-02-23 ENCOUNTER — Ambulatory Visit (INDEPENDENT_AMBULATORY_CARE_PROVIDER_SITE_OTHER): Payer: Commercial Managed Care - PPO | Admitting: Family Medicine

## 2021-02-23 ENCOUNTER — Other Ambulatory Visit: Payer: Self-pay

## 2021-02-23 ENCOUNTER — Encounter: Payer: Self-pay | Admitting: Family Medicine

## 2021-02-23 ENCOUNTER — Ambulatory Visit (HOSPITAL_BASED_OUTPATIENT_CLINIC_OR_DEPARTMENT_OTHER)
Admission: RE | Admit: 2021-02-23 | Discharge: 2021-02-23 | Disposition: A | Discharge status: Home / Self Care | Payer: Commercial Managed Care - PPO | Source: Ambulatory Visit | Attending: Family Medicine | Admitting: Family Medicine

## 2021-02-23 VITALS — BP 126/80 | HR 86 | Resp 17 | Ht 70.0 in | Wt 183.0 lb

## 2021-02-23 DIAGNOSIS — M79602 Pain in left arm: Secondary | ICD-10-CM | POA: Insufficient documentation

## 2021-02-23 DIAGNOSIS — G20 Parkinson's disease: Secondary | ICD-10-CM

## 2021-02-23 DIAGNOSIS — C8195 Hodgkin lymphoma, unspecified, lymph nodes of inguinal region and lower limb: Secondary | ICD-10-CM | POA: Diagnosis not present

## 2021-02-23 DIAGNOSIS — E65 Localized adiposity: Secondary | ICD-10-CM

## 2021-02-23 NOTE — Patient Instructions (Signed)
It was good to see you again today - I will be in touch with your x-ray report asap Please do get your 4th covid shot at your earliest convenience Take care!

## 2021-02-24 ENCOUNTER — Encounter: Payer: Self-pay | Admitting: Family Medicine

## 2021-02-24 ENCOUNTER — Encounter: Payer: Self-pay | Admitting: Hematology & Oncology

## 2021-02-27 ENCOUNTER — Telehealth: Payer: Self-pay

## 2021-02-27 ENCOUNTER — Encounter: Payer: Self-pay | Admitting: Hematology & Oncology

## 2021-02-27 NOTE — Telephone Encounter (Signed)
Called and left a vm/returned pts call per my chart message   Colbert Curenton

## 2021-03-08 ENCOUNTER — Other Ambulatory Visit: Payer: Commercial Managed Care - PPO

## 2021-03-08 ENCOUNTER — Encounter: Payer: Self-pay | Admitting: Family Medicine

## 2021-03-08 ENCOUNTER — Telehealth: Payer: Self-pay | Admitting: Family Medicine

## 2021-03-08 ENCOUNTER — Ambulatory Visit: Payer: Commercial Managed Care - PPO | Admitting: Hematology & Oncology

## 2021-03-08 ENCOUNTER — Ambulatory Visit: Payer: Commercial Managed Care - PPO

## 2021-03-08 NOTE — Telephone Encounter (Signed)
Patient states he is thinking of getting low back surgery. He would like to speak to Dr. Lorelei Pont to get her opinion

## 2021-03-26 HISTORY — PX: BACK SURGERY: SHX140

## 2021-03-27 ENCOUNTER — Encounter: Payer: Self-pay | Admitting: Family Medicine

## 2021-03-27 ENCOUNTER — Encounter: Payer: Self-pay | Admitting: Hematology & Oncology

## 2021-04-06 ENCOUNTER — Encounter: Payer: Self-pay | Admitting: Hematology & Oncology

## 2021-04-11 ENCOUNTER — Inpatient Hospital Stay (HOSPITAL_BASED_OUTPATIENT_CLINIC_OR_DEPARTMENT_OTHER): Payer: Commercial Managed Care - PPO | Admitting: Hematology & Oncology

## 2021-04-11 ENCOUNTER — Encounter: Payer: Self-pay | Admitting: *Deleted

## 2021-04-11 ENCOUNTER — Other Ambulatory Visit: Payer: Self-pay

## 2021-04-11 ENCOUNTER — Inpatient Hospital Stay: Payer: Commercial Managed Care - PPO

## 2021-04-11 ENCOUNTER — Encounter: Payer: Self-pay | Admitting: Hematology & Oncology

## 2021-04-11 ENCOUNTER — Inpatient Hospital Stay: Payer: Commercial Managed Care - PPO | Attending: Hematology & Oncology

## 2021-04-11 VITALS — BP 115/69 | HR 86 | Temp 98.4°F | Resp 18 | Wt 185.0 lb

## 2021-04-11 DIAGNOSIS — C8195 Hodgkin lymphoma, unspecified, lymph nodes of inguinal region and lower limb: Secondary | ICD-10-CM

## 2021-04-11 DIAGNOSIS — Z923 Personal history of irradiation: Secondary | ICD-10-CM | POA: Insufficient documentation

## 2021-04-11 DIAGNOSIS — Z8571 Personal history of Hodgkin lymphoma: Secondary | ICD-10-CM | POA: Insufficient documentation

## 2021-04-11 DIAGNOSIS — Z9484 Stem cells transplant status: Secondary | ICD-10-CM | POA: Diagnosis not present

## 2021-04-11 DIAGNOSIS — G20 Parkinson's disease: Secondary | ICD-10-CM | POA: Diagnosis not present

## 2021-04-11 LAB — CMP (CANCER CENTER ONLY)
ALT: 3 U/L (ref 0–44)
AST: 15 U/L (ref 15–41)
Albumin: 4.4 g/dL (ref 3.5–5.0)
Alkaline Phosphatase: 88 U/L (ref 38–126)
Anion gap: 6 (ref 5–15)
BUN: 42 mg/dL — ABNORMAL HIGH (ref 6–20)
CO2: 28 mmol/L (ref 22–32)
Calcium: 9.8 mg/dL (ref 8.9–10.3)
Chloride: 104 mmol/L (ref 98–111)
Creatinine: 1.15 mg/dL (ref 0.61–1.24)
GFR, Estimated: >60 mL/min (ref >60)
Glucose, Bld: 96 mg/dL (ref 70–99)
Potassium: 4.9 mmol/L (ref 3.5–5.1)
Sodium: 138 mmol/L (ref 135–145)
Total Bilirubin: 0.4 mg/dL (ref 0.3–1.2)
Total Protein: 6.4 g/dL — ABNORMAL LOW (ref 6.5–8.1)

## 2021-04-11 LAB — CBC WITH DIFFERENTIAL (CANCER CENTER ONLY)
Abs Immature Granulocytes: 0.09 10*3/uL — ABNORMAL HIGH (ref 0.00–0.07)
Basophils Absolute: 0.1 10*3/uL (ref 0.0–0.1)
Basophils Relative: 1 %
Eosinophils Absolute: 0.4 10*3/uL (ref 0.0–0.5)
Eosinophils Relative: 6 %
HCT: 38.7 % — ABNORMAL LOW (ref 39.0–52.0)
Hemoglobin: 12.8 g/dL — ABNORMAL LOW (ref 13.0–17.0)
Immature Granulocytes: 1 %
Lymphocytes Relative: 11 %
Lymphs Abs: 0.7 10*3/uL (ref 0.7–4.0)
MCH: 32.2 pg (ref 26.0–34.0)
MCHC: 33.1 g/dL (ref 30.0–36.0)
MCV: 97.5 fL (ref 80.0–100.0)
Monocytes Absolute: 0.6 10*3/uL (ref 0.1–1.0)
Monocytes Relative: 9 %
Neutro Abs: 4.4 10*3/uL (ref 1.7–7.7)
Neutrophils Relative %: 72 %
Platelet Count: 280 10*3/uL (ref 150–400)
RBC: 3.97 MIL/uL — ABNORMAL LOW (ref 4.22–5.81)
RDW: 12.5 % (ref 11.5–15.5)
WBC Count: 6.3 10*3/uL (ref 4.0–10.5)
nRBC: 0 % (ref 0.0–0.2)

## 2021-04-11 LAB — LACTATE DEHYDROGENASE: LDH: 191 U/L (ref 98–192)

## 2021-04-11 MED ORDER — SODIUM CHLORIDE 0.9 % IV SOLN
480.0000 mg | Freq: Once | INTRAVENOUS | Status: AC
  Administered 2021-04-11: 480 mg via INTRAVENOUS
  Filled 2021-04-11: qty 48

## 2021-04-11 MED ORDER — SODIUM CHLORIDE 0.9 % IV SOLN
Freq: Once | INTRAVENOUS | Status: AC
  Filled 2021-04-11: qty 250

## 2021-04-11 NOTE — Progress Notes (Signed)
Hematology and Oncology Follow Up Visit  Brian [name] [id number] [birth year] 60 y.o. 04/11/2021   Principle Diagnosis:  Recurrent Hodgkin's Disease -  S/p CAR-T therapy  TIA-resolved  Current Therapy:  Nivolumab q 3 month -- s/p cycle #18       Interim History:  [name] is in for follow-up.  Unfortunately, he just had lower back surgery.  This was a micro discectomy.  He has about 2 weeks ago.  I am surprised that he was able to make it to the office and drive himself.  Sound like he had a disc at L4-5.  He says he still having a lot of issues.  And says it takes him 45 minutes in the morning to get ready.  He says he still has some pain in his left leg.  In addition to all this, his Parkinson's seems to be more prominent.  I just wish there was something that could be done for this.  He is on carbidopa.  He is thinking about going to [facility] to see a neurologist there he sees one of our neurologist in town.  He has had no problems with fever.  He has had no COVID issues.  He has had no problems with cough or shortness of breath.  He has had no nausea or vomiting.  There is been no change in bowel or bladder habits.  We actually have him on a very long interval of treatment with the nivolumab.  These last nivolumab was probably back in January.  Overall, I would say his performance status is ECOG 1.    Medications:  Current Outpatient Medications:  .  aspirin EC 81 MG tablet, Take 81 mg by mouth daily. Swallow whole., Disp: , Rfl:  .  carbidopa-levodopa (SINEMET CR) 50-200 MG tablet, Take 1 tablet by mouth at bedtime., Disp: 90 tablet, Rfl: 1 .  carbidopa-levodopa (SINEMET IR) 25-100 MG tablet, 2 tablets at 7 AM, 2 tablets at 11 AM, 1 tablet at 3 PM, 1 tablet at 7 PM, Disp: 540 tablet, Rfl: 1 .  COVID-19 mRNA vaccine, Pfizer, 30 MCG/0.3ML injection, INJECT AS DIRECTED, Disp: .3 mL, Rfl: 0 .  docusate sodium (COLACE) 100 MG capsule, Take by mouth., Disp: , Rfl:  .  gabapentin  (NEURONTIN) 300 MG capsule, Take by mouth., Disp: , Rfl:  .  nivolumab (OPDIVO) 40 MG/4ML SOLN chemo injection, Inject into the vein., Disp: , Rfl:  .  olmesartan (BENICAR) 40 MG tablet, Take 40 mg by mouth daily., Disp: , Rfl:  .  olmesartan (BENICAR) 5 MG tablet, Take 10 mg by mouth daily., Disp: , Rfl:  .  oxyCODONE-acetaminophen (PERCOCET/ROXICET) 5-325 MG tablet, take 1 or 2 tablet by oral route  every 6 hours as needed for moderate or severe pain, Disp: , Rfl:  .  pramipexole (MIRAPEX) 1 MG tablet, Take 1 tablet (1 mg total) by mouth 3 (three) times daily., Disp: 270 tablet, Rfl: 2 .  Vitamin D, Ergocalciferol, (DRISDOL) 1.25 MG (50000 UNIT) CAPS capsule, Take 50,000 Units by mouth every 7 (seven) days., Disp: , Rfl:   Allergies: Not on File  Past Medical History, Surgical history, Social history, and Family History were reviewed and updated.  Review of Systems: Review of Systems  Neurological: Positive for tremors.  All other systems reviewed and are negative.    Physical Exam:  weight is 185 lb (83.9 kg). His oral temperature is 98.4 F (36.9 C). His blood pressure is 115/69 and his  pulse is 86. His respiration is 18 and oxygen saturation is 99%.   Physical Exam Vitals reviewed.  HENT:     Head: Normocephalic and atraumatic.  Eyes:     Pupils: Pupils are equal, round, and reactive to light.  Cardiovascular:     Rate and Rhythm: Normal rate and regular rhythm.     Heart sounds: Normal heart sounds.  Pulmonary:     Effort: Pulmonary effort is normal.     Breath sounds: Normal breath sounds.  Abdominal:     General: Bowel sounds are normal.     Palpations: Abdomen is soft.  Musculoskeletal:        General: No tenderness or deformity. Normal range of motion.     Cervical back: Normal range of motion.  Lymphadenopathy:     Cervical: No cervical adenopathy.  Skin:    General: Skin is warm and dry.     Findings: No erythema or rash.  Neurological:     Mental Status: He  is alert and oriented to person, place, and time.  Psychiatric:        Behavior: Behavior normal.        Thought Content: Thought content normal.        Judgment: Judgment normal.     Lab Results  Component Value Date   WBC 6.3 04/11/2021   HGB 12.8 (L) 04/11/2021   HCT 38.7 (L) 04/11/2021   MCV 97.5 04/11/2021   PLT 280 04/11/2021     Chemistry      Component Value Date/Time   NA 138 04/11/2021 1104   NA 142 04/02/2017 0000   NA 140 07/19/2016 1305   K 4.9 04/11/2021 1104   K 4.4 07/19/2016 1305   CL 104 04/11/2021 1104   CL 105 02/08/2016 1117   CL 107 03/25/2013 0828   CO2 28 04/11/2021 1104   CO2 26 07/19/2016 1305   BUN 42 (H) 04/11/2021 1104   BUN 21 04/02/2017 0000   BUN 22.8 07/19/2016 1305   CREATININE 1.15 04/11/2021 1104   CREATININE 1.1 07/19/2016 1305   GLU 105 04/02/2017 0000      Component Value Date/Time   CALCIUM 9.8 04/11/2021 1104   CALCIUM 9.8 07/19/2016 1305   ALKPHOS 88 04/11/2021 1104   ALKPHOS 76 07/19/2016 1305   AST 15 04/11/2021 1104   AST 18 07/19/2016 1305   ALT 3 04/11/2021 1104   ALT 14 07/19/2016 1305   BILITOT 0.4 04/11/2021 1104   BILITOT 0.70 07/19/2016 1305      Impression and Plan: Brian [name] is a 60 year old gentleman.  He has history of recurrent Hodgkin's disease. He underwent a autologous stem cell transplant back in Brian of 2014. He then had a recurrence after this.  He did undergo CAR[facility]T therapy at [facility]  I believe he had this in April 2017.  He did well with this.  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  This was proven by biopsy.  He had radiation therapy for this.  We will see about another PET scan on him.  We will get this in June.  I know that we have to do another treatment of nivolumab until September.  Hopefully, we might be able to think about so holding the nivolumab.    [name], MD 5/17/20223:17 PM

## 2021-04-11 NOTE — Patient Instructions (Signed)
[facility]  Discharge Instructions: Thank you for choosing [facility] to provide your oncology and hematology care.   If you have a lab appointment with the Cancer Center, please go directly to the Cancer Center and check in at the registration area.  Wear comfortable clothing and clothing appropriate for easy access to any Portacath or PICC line.   We strive to give you quality time with your provider. You may need to reschedule your appointment if you arrive late (15 or more minutes).  Arriving late affects you and other patients whose appointments are after yours.  Also, if you miss three or more appointments without notifying the office, you may be dismissed from the clinic at the provider's discretion.      For prescription refill requests, have your pharmacy contact our office and allow 72 hours for refills to be completed.    Today you received the following chemotherapy and/or immunotherapy agents:  Opdivo      To help prevent nausea and vomiting after your treatment, we encourage you to take your nausea medication as directed.  BELOW ARE SYMPTOMS THAT SHOULD BE REPORTED IMMEDIATELY: *FEVER GREATER THAN 100.4 F (38 C) OR HIGHER *CHILLS OR SWEATING *NAUSEA AND VOMITING THAT IS NOT CONTROLLED WITH YOUR NAUSEA MEDICATION *UNUSUAL SHORTNESS OF BREATH *UNUSUAL BRUISING OR BLEEDING *URINARY PROBLEMS (pain or burning when urinating, or frequent urination) *BOWEL PROBLEMS (unusual diarrhea, constipation, pain near the anus) TENDERNESS IN MOUTH AND THROAT WITH OR WITHOUT PRESENCE OF ULCERS (sore throat, sores in mouth, or a toothache) UNUSUAL RASH, SWELLING OR PAIN  UNUSUAL VAGINAL DISCHARGE OR ITCHING   Items with * indicate a potential emergency and should be followed up as soon as possible or go to the Emergency Department if any problems should occur.  Please show the CHEMOTHERAPY ALERT CARD or IMMUNOTHERAPY ALERT CARD at check-in to the  Emergency Department and triage nurse. Should you have questions after your visit or need to cancel or reschedule your appointment, please contact [facility]  [phone] and follow the prompts.  Office hours are 8:00 a.m. to 4:30 p.m. Monday - Friday. Please note that voicemails left after 4:00 p.m. may not be returned until the following business day.  We are closed weekends and major holidays. You have access to a nurse at all times for urgent questions. Please call the main number to the clinic [phone] and follow the prompts.  For any non-urgent questions, you may also contact your provider using MyChart. We now offer e-Visits for anyone 18 and older to request care online for non-urgent symptoms. For details visit [email or web address].   Also download the MyChart app! Go to the app store, search "MyChart", open the app, select , and log in with your MyChart username and password.  Due to Covid, a mask is required upon entering the hospital/clinic. If you do not have a mask, one will be given to you upon arrival. For doctor visits, patients may have 1 support person aged 18 or older with them. For treatment visits, patients cannot have anyone with them due to current Covid guidelines and our immunocompromised population.  

## 2021-04-14 ENCOUNTER — Encounter: Payer: Self-pay | Admitting: Hematology & Oncology

## 2021-04-17 ENCOUNTER — Encounter: Payer: Self-pay | Admitting: Hematology & Oncology

## 2021-04-18 ENCOUNTER — Emergency Department (HOSPITAL_BASED_OUTPATIENT_CLINIC_OR_DEPARTMENT_OTHER)
Admission: EM | Admit: 2021-04-18 | Discharge: 2021-04-19 | Disposition: A | Discharge status: Home / Self Care | Payer: Commercial Managed Care - PPO | Attending: Emergency Medicine | Admitting: Emergency Medicine

## 2021-04-18 ENCOUNTER — Encounter (HOSPITAL_BASED_OUTPATIENT_CLINIC_OR_DEPARTMENT_OTHER): Payer: Self-pay

## 2021-04-18 ENCOUNTER — Other Ambulatory Visit: Payer: Self-pay

## 2021-04-18 ENCOUNTER — Emergency Department (HOSPITAL_BASED_OUTPATIENT_CLINIC_OR_DEPARTMENT_OTHER): Payer: Commercial Managed Care - PPO | Admitting: Radiology

## 2021-04-18 DIAGNOSIS — M5442 Lumbago with sciatica, left side: Secondary | ICD-10-CM | POA: Insufficient documentation

## 2021-04-18 DIAGNOSIS — Z7982 Long term (current) use of aspirin: Secondary | ICD-10-CM | POA: Insufficient documentation

## 2021-04-18 DIAGNOSIS — M79652 Pain in left thigh: Secondary | ICD-10-CM | POA: Diagnosis not present

## 2021-04-18 DIAGNOSIS — M79651 Pain in right thigh: Secondary | ICD-10-CM | POA: Diagnosis not present

## 2021-04-18 DIAGNOSIS — M545 Low back pain, unspecified: Secondary | ICD-10-CM | POA: Diagnosis present

## 2021-04-18 MED ORDER — KETOROLAC TROMETHAMINE 15 MG/ML IJ SOLN
30.0000 mg | Freq: Once | INTRAMUSCULAR | Status: AC
  Administered 2021-04-19: 30 mg via INTRAMUSCULAR
  Filled 2021-04-18: qty 2

## 2021-04-18 MED ORDER — DEXAMETHASONE SODIUM PHOSPHATE 10 MG/ML IJ SOLN
10.0000 mg | Freq: Once | INTRAMUSCULAR | Status: AC
  Administered 2021-04-19: 10 mg via INTRAMUSCULAR
  Filled 2021-04-18: qty 1

## 2021-04-18 NOTE — ED Provider Notes (Addendum)
[facility] EMERGENCY DEPT Provider Note   CSN: [id number] Arrival date & time: 04/18/21  2033     History Chief Complaint  Patient presents with  . Back Pain  . Leg Pain    Brian [name] is a 60 y.o. male.  Brian [name] is status post a recent microdiscectomy for lumbar radicular pain.  He was doing fairly well after surgery, and then a week ago, his pain started to worsen.  The pain is in roughly the same distribution as it was prior to the surgery, but it is much worse.  He is also being treated by oncology for Hodgkin's lymphoma.  He has had numerous recurrences, and he has a PET scan scheduled in a few weeks.  He has been taking ibuprofen with minimal relief.  Postoperatively, he took Percocet.  He states the Percocet did nothing more than make him sleepy.  He also took gabapentin which was ineffective.  He has had epidural injections, and he wanted to have another, but it was going to be a couple weeks prior to him getting in for this injection.  Therefore, he came tonight.  He states that he is having pain in the posterior legs that is shooting and stabbing.  He is very stiff in the morning, and he has difficulty ambulating.  The history is provided by the patient.  Back Pain Location:  Lumbar spine Quality:  Stabbing Radiates to:  L posterior upper leg and R posterior upper leg (L > R) Pain severity:  Severe Worse during: worse in the morning. Onset quality:  Gradual Duration:  1 week Timing:  Constant Progression:  Worsening Chronicity:  Recurrent Context comment:  Recent discectomy Relieved by:  Nothing Worsened by:  Bending, movement and ambulation Ineffective treatments:  Ibuprofen Associated symptoms: leg pain   Associated symptoms: no abdominal pain, no bladder incontinence, no bowel incontinence, no chest pain, no dysuria, no fever, no numbness, no paresthesias, no perianal numbness and no weakness   Leg Pain Associated symptoms: back pain   Associated  symptoms: no fever        Past Medical History:  Diagnosis Date  . Anxiety   . Dysrhythmia    past hx pvc  . Goals of care, counseling/discussion 09/25/2018  . H/O autologous stem cell transplant ([place])    may 2014  at [facility]  . History of Bell's palsy    2009  RIGHT SIDE--  HAS 80% FUNCTION / PT STATES A LITTLE ASYMETRICAL AND EFFECTS MOUTH  . History of radiation therapy 10/18/17-11/05/17   sprine T1 26 Gy in 13 fractions, spine boost 10 Gy in 5 fractions  . Hodgkin's disease, nodular sclerosis, of inguinal region/lower limb ([place]) ONOLOGIST--  DR ENNEVER AND A [facility]     [facility] CHEMO 2013/  AUTOLOGUS STEM CELL TRANSPLANT MAY 2014 AT [facility]  . Mass of right chest wall 09/18/2018  . Mass of right inguinal region   . PVC (premature ventricular contraction)    "benign"  . TIA (transient ischemic attack) 02/2015   "probable TIA"  . Wears contact lenses     Patient Active Problem List   Diagnosis Date Noted  . Goals of care, counseling/discussion 09/25/2018  . Mass of right chest wall 09/18/2018  . PD (Parkinson's disease) ([place]) 11/27/2016  . Lymphadenopathy 07/28/2015  . Mediastinal adenopathy 07/14/2015  . Aphasia 03/04/2015  . Slurred speech 03/04/2015  . Pleural mass 05/31/2014  . Abdominal pain, epigastric 02/02/2014  . Weight loss 08/12/2012  . H/O  Bell's palsy 08/12/2012  . Hodgkin's disease 08/09/2011    Past Surgical History:  Procedure Laterality Date  . AXILLARY LYMPH NODE BIOPSY Left 02/02/2013   Procedure: NEEDLE LOCALIZED AXILLARY LYMPH NODE BIOPSY;  Surgeon: Haywood Lasso, MD;  Location: [facility];  Service: General;  Laterality: Left;  . BONE MARROW BIOPSY  08/2012  . CYST REMOVAL NECK Left 1980  . LEFT INGUINAL LYMPH NODE BX  09-09-2012  . LYMPH NODE BIOPSY N/A 07/28/2015   Procedure: LYMPH NODE BIOPSY;  Surgeon: Melrose Nakayama, MD;  Location: [facility];  Service: Thoracic;  Laterality: N/A;  . MASS EXCISION Right 09/19/2018   Procedure:  EXCISION OF RIGHT CHEST WALL MASS ERAS PATHWAY;  Surgeon: Fanny Skates, MD;  Location: [facility];  Service: General;  Laterality: Right;  . NODE DISSECTION Right 07/28/2015   Procedure: NODE DISSECTION;  Surgeon: Melrose Nakayama, MD;  Location: [facility];  Service: Thoracic;  Laterality: Right;  . PLEURA BIOPSY Left 05/31/2014  . REMOVAL RIGHT INGUINAL LYMPH NODES  08-16-2011  . SCROTAL EXPLORATION Right 01/04/2014   Procedure: SCROTUM EXPLORATION   INGUINAL , EXCISION OF CYSTIC MASS OF RIGHT SPERMATIC CORD, WITH FROZEN SECTION;  Surgeon: Franchot Gallo, MD;  Location: [facility];  Service: Urology;  Laterality: Right;  . TRANSTHORACIC ECHOCARDIOGRAM  03-18-2013   MILD LVH/  EF 55-60%  . VIDEO ASSISTED THORACOSCOPY Left 05/31/2014   Procedure: LEFT VIDEO ASSISTED THORACOSCOPY, PLEURAL BIOPSY;  Surgeon: Melrose Nakayama, MD;  Location: [facility];  Service: Thoracic;  Laterality: Left;  Marland Kitchen VIDEO ASSISTED THORACOSCOPY Right 07/28/2015   Procedure: RIGHT VIDEO ASSISTED THORACOSCOPY;  Surgeon: Melrose Nakayama, MD;  Location: [facility];  Service: Thoracic;  Laterality: Right;  Marland Kitchen VIDEO ASSISTED THORACOSCOPY (VATS)/ LYMPH NODE SAMPLING Right 07/28/2015  . VIDEO ASSISTED THORACOSCOPY (VATS)/WEDGE RESECTION  05/31/2014  . VIDEO BRONCHOSCOPY WITH ENDOBRONCHIAL ULTRASOUND  07/28/2015  . VIDEO BRONCHOSCOPY WITH ENDOBRONCHIAL ULTRASOUND N/A 07/28/2015   Procedure: VIDEO BRONCHOSCOPY WITH ENDOBRONCHIAL ULTRASOUND;  Surgeon: Melrose Nakayama, MD;  Location: [facility];  Service: Thoracic;  Laterality: N/A;       Family History  Problem Relation Age of Onset  . Cancer Mother 20       ovarian  . Ovarian cancer Mother 35  . Heart disease Father   . Stroke Father   . Cancer Brother        testicular  . Hypertension Brother   . Healthy Son   . Healthy Son   . Hypertension Brother   . Other Brother        CMT  . Hypertension Brother     Social History   Tobacco Use  . Smoking  status: Never Smoker  . Smokeless tobacco: Never Used  Vaping Use  . Vaping Use: Never used  Substance Use Topics  . Alcohol use: Yes    Alcohol/week: 14.0 standard drinks    Types: 7 Cans of beer, 7 Glasses of wine per week  . Drug use: No    Home Medications Prior to Admission medications   Medication Sig Start Date End Date Taking? Authorizing Provider  aspirin EC 81 MG tablet Take 81 mg by mouth daily. Swallow whole.    [provider]  carbidopa-levodopa (SINEMET CR) 50-200 MG tablet Take 1 tablet by mouth at bedtime. 02/01/21   Tat, Eustace Quail, DO  carbidopa-levodopa (SINEMET IR) 25-100 MG tablet 2 tablets at 7 AM, 2 tablets at 11 AM, 1 tablet  at 3 PM, 1 tablet at 7 PM 02/01/21   Tat, Eustace Quail, DO  COVID-19 mRNA vaccine, Pfizer, 30 MCG/0.3ML injection INJECT AS DIRECTED 10/03/20 10/03/21  Carlyle Basques, MD  docusate sodium (COLACE) 100 MG capsule Take by mouth. 03/29/21   [provider]  gabapentin (NEURONTIN) 300 MG capsule Take by mouth. 03/30/21   [provider]  nivolumab (OPDIVO) 40 MG/4ML SOLN chemo injection Inject into the vein.    [provider]  olmesartan (BENICAR) 40 MG tablet Take 40 mg by mouth daily. 03/16/21   [provider]  olmesartan (BENICAR) 5 MG tablet Take 10 mg by mouth daily.    [provider]  oxyCODONE-acetaminophen (PERCOCET/ROXICET) 5-325 MG tablet take 1 or 2 tablet by oral route  every 6 hours as needed for moderate or severe pain 03/29/21   [provider]  pramipexole (MIRAPEX) 1 MG tablet Take 1 tablet (1 mg total) by mouth 3 (three) times daily. 11/28/20   Tat, Eustace Quail, DO  Vitamin D, Ergocalciferol, (DRISDOL) 1.25 MG (50000 UNIT) CAPS capsule Take 50,000 Units by mouth every 7 (seven) days.    [provider]    Allergies    Patient has no allergy information on record.  Review of Systems   Review of Systems  Constitutional: Negative for chills and fever.  HENT: Negative for  ear pain and sore throat.   Eyes: Negative for pain and visual disturbance.  Respiratory: Negative for cough and shortness of breath.   Cardiovascular: Negative for chest pain and palpitations.  Gastrointestinal: Negative for abdominal pain, bowel incontinence and vomiting.  Genitourinary: Negative for bladder incontinence, dysuria and hematuria.  Musculoskeletal: Positive for back pain. Negative for arthralgias.  Skin: Negative for color change and rash.  Neurological: Negative for seizures, syncope, weakness, numbness and paresthesias.  All other systems reviewed and are negative.   Physical Exam Updated Vital Signs BP (!) 148/108 (BP Location: Right Arm)   Pulse 99   Temp 98 F (36.7 C) (Oral)   Resp 18   Ht _0  (1.778 m)   Wt 81.6 kg   SpO2 98%   BMI 25.83 kg/m   Physical Exam Vitals and nursing note reviewed.  Constitutional:      Appearance: Normal appearance.  HENT:     Head: Normocephalic and atraumatic.  Eyes:     Conjunctiva/sclera: Conjunctivae normal.  Pulmonary:     Effort: Pulmonary effort is normal. No respiratory distress.  Musculoskeletal:        General: No deformity. Normal range of motion.     Cervical back: Normal range of motion.     Comments: The patient has a normal lumbar spine.  He has tenderness to palpation about the midline of the lumbar spine.  He has full range of motion with only minimal pain with forward flexion.  He has limited hamstring flexibility but negative straight leg raises.  No reduced hip range of motion.  Skin:    General: Skin is warm and dry.  Neurological:     General: No focal deficit present.     Mental Status: He is alert and oriented to person, place, and time. Mental status is at baseline.     Sensory: No sensory deficit.     Motor: No weakness.  Psychiatric:        Mood and Affect: Mood normal.     ED Results / Procedures / Treatments   Labs (all labs ordered are listed, but only abnormal results  are  displayed) Labs Reviewed - No data to display  EKG None  Radiology DG Lumbar Spine Complete  Result Date: 04/18/2021 CLINICAL DATA:  Low back pain. Left leg radicular pain. Patient reports recent back surgery and out of pain meds. EXAM: LUMBAR SPINE - COMPLETE 4+ VIEW COMPARISON:  Lumbar radiograph 10/28/2020 FINDINGS: Similar dextroscoliotic curvature to prior. Slight straightening of normal lordosis. Trace retrolisthesis of L2 on L3, L3 on L4, and L4 on L5 unchanged. Disc space narrowing and endplate spurring at L9-F7, L3-L4, and L4-L5. Lower lumbar facet hypertrophy. No fracture, focal lesion or bone destruction. The sacroiliac joints are congruent. IMPRESSION: 1. No acute radiographic findings. 2. Unchanged scoliosis with multilevel degenerative disc disease and facet hypertrophy. Electronically Signed   By: Keith Rake M.D.   On: 04/18/2021 23:40    Procedures Procedures   Medications Ordered in ED Medications - No data to display  ED Course  I have reviewed the triage vital signs and the nursing notes.  Pertinent labs & imaging results that were available during my care of the patient were reviewed by me and considered in my medical decision making (see chart for details).    MDM Rules/Calculators/A&P                          JERMANI EBERLEIN has a history of recent microdiscectomy and has a history of Hodgkin lymphoma.  He presents with severe back pain.  No evidence of neurologic compromise.  Given his cancer history, we decided to obtain an x-ray to evaluate for any obvious bony lesions.  He does have a PET scan upcoming.  We talked through options for pain relief, and he would like to try a steroid injection.  I also offered a Toradol injection.  He has not had good relief from opioids in the past, and I do not think this is the best route.  We talked about making sure that his symptoms were fully evaluated and were not related to a different problem than his lumbar  radiculopathy.  I do think he would benefit from further assessment if symptoms fail to respond.  This could be rheumatologic, orthopedic, or even oncologic.  No acute abnormalities were seen on his x-ray, but he does have arthritis.  This could be contributing to his symptoms.  I would recommend ongoing symptomatic management and follow-up with his various specialists.  Final Clinical Impression(s) / ED Diagnoses Final diagnoses:  Bilateral low back pain with left-sided sciatica, unspecified chronicity    Rx / DC Orders ED Discharge Orders    None       Arnaldo Natal, MD 04/18/21 [id number]    Arnaldo Natal, MD 04/18/21 [phone]

## 2021-04-18 NOTE — ED Triage Notes (Signed)
Pt c/o left leg pain. Pt had recent back surgery and states he is out of his pain meds and his physician will not prescribe anymore until he can get him in the office. Pt states pain shoots down his back into his leg and buttocks.

## 2021-04-19 ENCOUNTER — Telehealth: Payer: Self-pay

## 2021-04-19 ENCOUNTER — Other Ambulatory Visit (HOSPITAL_COMMUNITY): Payer: Self-pay | Admitting: Neurosurgery

## 2021-04-19 ENCOUNTER — Encounter: Payer: Self-pay | Admitting: Family Medicine

## 2021-04-19 ENCOUNTER — Telehealth: Payer: Self-pay | Admitting: Family Medicine

## 2021-04-19 DIAGNOSIS — M5416 Radiculopathy, lumbar region: Secondary | ICD-10-CM

## 2021-04-19 NOTE — ED Notes (Signed)
This RN presented the AVS utilizing Teachback Method. Patient verbalizes understanding of Discharge Instructions. Opportunity for Questioning and Answers were provided. Patient Discharged from ED ambulatory to Home with Significant Other.

## 2021-04-19 NOTE — Telephone Encounter (Signed)
Patient states he is a great deal of pain due to his recent spinal surgery and want to know if she can get him an appt sooner than June 9th at [facility]. The appt was offered but he denied it. And wanted to speak with [place] to see if she can make a call to have appt sooner.,

## 2021-04-19 NOTE — Telephone Encounter (Signed)
Nurse Assessment Nurse: Rolin Barry, RN, Levada Dy Date/Time Eilene Ghazi Time): 04/18/2021 5:22:31 PM Confirm and document reason for call. If symptomatic, describe symptoms. ---Caller states he is in extreme pain and would like to know what to do. Caller had a spinal surgery 3 weeks ago. Taking a med for Lymphoma. Advised that he has been on Gabapentin and percocet. No temp. Does the patient have any new or worsening symptoms? ---Yes Will a triage be completed? ---Yes Related visit to physician within the last 2 weeks? ---Yes Does the PT have any chronic conditions? (i.e. diabetes, asthma, this includes High risk factors for pregnancy, etc.) ---Yes List chronic conditions. ---hx of lymphoma recent spinal sx Is this a behavioral health or substance abuse call? ---No Guidelines Guideline Title Affirmed Question Affirmed Notes Nurse Date/Time Eilene Ghazi Time) Leg Pain [1] SEVERE pain (e.g., excruciating, unable to do any normal activities) AND [2] not improved after 2 hours of pain medicine Deaton, RN, Levada Dy 04/18/2021 5:27:24 PM PLEASE NOTE: All timestamps contained within this report are represented as [place] Standard Time. CONFIDENTIALTY NOTICE: This fax transmission is intended only for the addressee. It contains information that is legally privileged, confidential or otherwise protected from use or disclosure. If you are not the intended recipient, you are strictly prohibited from reviewing, disclosing, copying using or disseminating any of this information or taking any action in reliance on or regarding this information. If you have received this fax in error, please notify us immediately by telephone so that we can arrange for its return to [place]. Phone: [phone], Toll-Free: [phone], Fax: [phone] Page: 2 of 2 Call Id: [id number] [place]. Time Eilene Ghazi Time) Disposition Final User 04/18/2021 5:30:43 PM See HCP within 4 Hours (or PCP triage) Yes Deaton, RN, Cindee Lame  Disagree/Comply Comply Caller Understands Yes PreDisposition Go to ED Care Advice Given Per Guideline SEE HCP (OR PCP TRIAGE) WITHIN 4 HOURS: * IF OFFICE WILL BE CLOSED AND NO PCP (PRIMARY CARE PROVIDER) SECOND-LEVEL TRIAGE: You need to be seen within the next 3 or 4 hours. A nearby Urgent Care Center [facility]) is often a good source of care. Another choice is to go to the ED. Go sooner if you become worse. * UCC: Some UCCs can manage patients who are stable and have less serious symptoms (e.g., minor illnesses and injuries). The triager must know the [facility] capabilities before sending a patient there. If unsure, call ahead. * ED: Patients who may need surgery or hospital admission need to be sent to an ED. So do most patients with serious symptoms or complex medical problems. PAIN MEDICINES: * For pain relief, you can take either acetaminophen, ibuprofen, or naproxen. CALL BACK IF: * You become worse CARE ADVICE given per Leg Pain (Adult) guideline. Comments User: Saverio Danker, RN Date/Time Eilene Ghazi Time): 04/18/2021 5:32:26 PM Caller is going to monitor for 4 hours, then decide. Referrals GO TO FACILITY OTHER - SPECIFY  Pt seen in ED at [facility].

## 2021-04-20 ENCOUNTER — Other Ambulatory Visit: Payer: Self-pay | Admitting: Neurosurgery

## 2021-04-20 DIAGNOSIS — M5416 Radiculopathy, lumbar region: Secondary | ICD-10-CM

## 2021-04-20 NOTE — Telephone Encounter (Signed)
See encounter with pcp and patient via mychart.

## 2021-04-20 NOTE — Telephone Encounter (Signed)
Called pt- he had a microdiscectomy on 5/4 He has an rx for oxycodone but this is not helping. He does have an epidural steroid injection coming up At this time he does not think there is anything I can do to help but he will let me know  We spoke for some time and I heard his concerns, we are glad to help as needed

## 2021-04-25 ENCOUNTER — Ambulatory Visit
Admission: RE | Admit: 2021-04-25 | Discharge: 2021-04-25 | Disposition: A | Discharge status: Home / Self Care | Payer: Commercial Managed Care - PPO | Source: Ambulatory Visit | Attending: Neurosurgery | Admitting: Neurosurgery

## 2021-04-25 ENCOUNTER — Other Ambulatory Visit: Payer: Self-pay

## 2021-04-25 DIAGNOSIS — M5416 Radiculopathy, lumbar region: Secondary | ICD-10-CM

## 2021-04-25 MED ORDER — METHYLPREDNISOLONE ACETATE 40 MG/ML INJ SUSP (RADIOLOG
801.0000 mg | Freq: Once | INTRAMUSCULAR | Status: AC
  Administered 2021-04-25: 801 mg via EPIDURAL

## 2021-04-25 MED ORDER — IOPAMIDOL (ISOVUE-M 200) INJECTION 41%
1.0000 mL | Freq: Once | INTRAMUSCULAR | Status: AC
  Administered 2021-04-25: 1 mL via EPIDURAL

## 2021-04-25 NOTE — Discharge Instructions (Signed)
Post Procedure Spinal Discharge Instruction Sheet  1. You may resume a regular diet and any medications that you routinely take (including pain medications).  2. No driving day of procedure.  3. Light activity throughout the rest of the day.  Do not do any strenuous work, exercise, bending or lifting.  The day following the procedure, you can resume normal physical activity but you should refrain from exercising or physical therapy for at least three days thereafter.   Common Side Effects:   Headaches- take your usual medications as directed by your physician.  Increase your fluid intake.  Caffeinated beverages may be helpful.  Lie flat in bed until your headache resolves.   Restlessness or inability to sleep- you may have trouble sleeping for the next few days.  Ask your referring physician if you need any medication for sleep.   Facial flushing or redness- should subside within a few days.   Increased pain- a temporary increase in pain a day or two following your procedure is not unusual.  Take your pain medication as prescribed by your referring physician.   Leg cramps  Please contact our office at [phone] for the following symptoms:  Fever greater than 100 degrees.  Headaches unresolved with medication after 2-3 days.  Increased swelling, pain, or redness at injection site.   Thank you for visiting [facility] today.  

## 2021-05-10 ENCOUNTER — Encounter: Payer: Self-pay | Admitting: Family Medicine

## 2021-05-10 ENCOUNTER — Telehealth (INDEPENDENT_AMBULATORY_CARE_PROVIDER_SITE_OTHER): Payer: Commercial Managed Care - PPO | Admitting: Family Medicine

## 2021-05-10 ENCOUNTER — Other Ambulatory Visit: Payer: Self-pay

## 2021-05-10 VITALS — Ht 70.0 in | Wt 175.0 lb

## 2021-05-10 DIAGNOSIS — M545 Low back pain, unspecified: Secondary | ICD-10-CM | POA: Diagnosis not present

## 2021-05-10 DIAGNOSIS — M79605 Pain in left leg: Secondary | ICD-10-CM

## 2021-05-10 DIAGNOSIS — G20 Parkinson's disease: Secondary | ICD-10-CM | POA: Diagnosis not present

## 2021-05-10 DIAGNOSIS — C8195 Hodgkin lymphoma, unspecified, lymph nodes of inguinal region and lower limb: Secondary | ICD-10-CM | POA: Diagnosis not present

## 2021-05-10 MED ORDER — TRAMADOL HCL 50 MG PO TABS: 50.0000 mg | ORAL_TABLET | Freq: Three times a day (TID) | ORAL | 0 refills | Status: AC | PRN

## 2021-05-10 NOTE — Progress Notes (Signed)
[place] [facility] [address] [place], [place] [address] [phone]  Date:  05/10/2021   Name:  Brian [name]   DOB:  1961/05/26   MRN:  [id number]  PCP:  Darreld Mclean, MD    Chief Complaint: back surgery questions (Wants to know about pain management )   History of Present Illness:  Brian [name] is a 61 y.o. very pleasant male patient who presents with the following:  Virtual visit today-  History of parkinson disease, Hodgkin disease- s/p stem cell transplant in 2014, then recurrence in his spine s/p radiation.  He also had a microdisectomy on 5/4; unfortunately he has not had much relief from his back pain Pt location is home, I am at office The patient and myself are present on the visit today He gives consent for a virtual visit -patient identity confirmed with 2 factors today  He is concerned about his weight - he notes that he has lost some weight, he thinks due to his pain level.  He notes significant pain around the clock  Visit with oncology last month-  recurrent Hodgkin's disease  He is getting his PET scan next week- routine- and also an MRI to follow-up on his back per neurosurgery  He notes that he is having back pain all the time and also pain down his LEFT leg -if feels like nerve pain to him, a stinging burning type pain.  Wt Readings from Last 3 Encounters:  05/10/21 175 lb (79.4 kg)  04/18/21 180 lb (81.6 kg)  04/11/21 185 lb (83.9 kg)   He notes that pain meds are just not working.  Advil/ tylenol works as well as anything -he is not currently using anything opioid He is using a crutch or a cane to walk   They have tried steroids but it did not help reduce his pain  He is having more falls due to Parkinson disease and his back pain He has installed grab bars in his shower which he will use as needed   Patient Active Problem List   Diagnosis Date Noted   Goals of care,  counseling/discussion 09/25/2018   Mass of right chest wall 09/18/2018   PD (Parkinson's disease) ([place]) 11/27/2016   Lymphadenopathy 07/28/2015   Mediastinal adenopathy 07/14/2015   Aphasia 03/04/2015   Slurred speech 03/04/2015   Pleural mass 05/31/2014   Abdominal pain, epigastric 02/02/2014   Weight loss 08/12/2012   H/O Bell's palsy 08/12/2012   Hodgkin's disease 08/09/2011    Past Medical History:  Diagnosis Date   Anxiety    Dysrhythmia    past hx pvc   Goals of care, counseling/discussion 09/25/2018   H/O autologous stem cell transplant ([place])    may 2014  at [facility]   History of Bell's palsy    2009  RIGHT SIDE--  HAS 80% FUNCTION / PT STATES A LITTLE ASYMETRICAL AND EFFECTS MOUTH   History of radiation therapy 10/18/17-11/05/17   sprine T1 26 Gy in 13 fractions, spine boost 10 Gy in 5 fractions   Hodgkin's disease, nodular sclerosis, of inguinal region/lower limb ([place]) ONOLOGIST--  DR [facility] AND A [facility]     SALVAGE CHEMO 2013/  AUTOLOGUS STEM CELL TRANSPLANT MAY 2014 AT [facility]   Mass of right chest wall 09/18/2018   Mass of right inguinal region    PVC (premature ventricular contraction)    "benign"   TIA (transient ischemic attack) 02/2015   "  probable TIA"   Wears contact lenses     Past Surgical History:  Procedure Laterality Date   AXILLARY LYMPH NODE BIOPSY Left 02/02/2013   Procedure: NEEDLE LOCALIZED AXILLARY LYMPH NODE BIOPSY;  Surgeon: Haywood Lasso, MD;  Location: [facility];  Service: General;  Laterality: Left;   BONE MARROW BIOPSY  08/2012   CYST REMOVAL NECK Left 1980   LEFT INGUINAL LYMPH NODE BX  09-09-2012   LYMPH NODE BIOPSY N/A 07/28/2015   Procedure: LYMPH NODE BIOPSY;  Surgeon: Melrose Nakayama, MD;  Location: [facility];  Service: Thoracic;  Laterality: N/A;   MASS EXCISION Right 09/19/2018   Procedure: EXCISION OF RIGHT CHEST WALL MASS ERAS PATHWAY;  Surgeon: Fanny Skates, MD;  Location: [facility];  Service:  General;  Laterality: Right;   NODE DISSECTION Right 07/28/2015   Procedure: NODE DISSECTION;  Surgeon: Melrose Nakayama, MD;  Location: [facility];  Service: Thoracic;  Laterality: Right;   PLEURA BIOPSY Left 05/31/2014   REMOVAL RIGHT INGUINAL LYMPH NODES  08-16-2011   SCROTAL EXPLORATION Right 01/04/2014   Procedure: SCROTUM EXPLORATION   INGUINAL , EXCISION OF CYSTIC MASS OF RIGHT SPERMATIC CORD, [place];  Surgeon: Franchot Gallo, MD;  Location: [facility];  Service: Urology;  Laterality: Right;   TRANSTHORACIC ECHOCARDIOGRAM  03-18-2013   MILD LVH/  EF 55-60%   VIDEO ASSISTED THORACOSCOPY Left 05/31/2014   Procedure: LEFT VIDEO ASSISTED THORACOSCOPY, PLEURAL BIOPSY;  Surgeon: Melrose Nakayama, MD;  Location: [facility];  Service: Thoracic;  Laterality: Left;   VIDEO ASSISTED THORACOSCOPY Right 07/28/2015   Procedure: RIGHT VIDEO ASSISTED THORACOSCOPY;  Surgeon: Melrose Nakayama, MD;  Location: [facility];  Service: Thoracic;  Laterality: Right;   VIDEO ASSISTED THORACOSCOPY (VATS)/ LYMPH NODE SAMPLING Right 07/28/2015   VIDEO ASSISTED THORACOSCOPY (VATS)/WEDGE RESECTION  05/31/2014   VIDEO BRONCHOSCOPY WITH ENDOBRONCHIAL ULTRASOUND  07/28/2015   VIDEO BRONCHOSCOPY WITH ENDOBRONCHIAL ULTRASOUND N/A 07/28/2015   Procedure: VIDEO BRONCHOSCOPY WITH ENDOBRONCHIAL ULTRASOUND;  Surgeon: Melrose Nakayama, MD;  Location: [facility];  Service: Thoracic;  Laterality: N/A;    Social History   Tobacco Use   Smoking status: Never   Smokeless tobacco: Never  Vaping Use   Vaping Use: Never used  Substance Use Topics   Alcohol use: Yes    Alcohol/week: 14.0 standard drinks    Types: 7 Cans of beer, 7 Glasses of wine per week   Drug use: No    Family History  Problem Relation Age of Onset   Cancer Mother 39       ovarian   Ovarian cancer Mother 4   Heart disease Father    Stroke Father    Cancer Brother        testicular   Hypertension Brother    Healthy Son    Healthy Son     Hypertension Brother    Other Brother        CMT   Hypertension Brother     No Known Allergies  Medication list has been reviewed and updated.  Current Outpatient Medications on File Prior to Visit  Medication Sig Dispense Refill   aspirin EC 81 MG tablet Take 81 mg by mouth daily. Swallow whole.     docusate sodium (COLACE) 100 MG capsule Take by mouth.     gabapentin (NEURONTIN) 300 MG capsule Take by mouth.     nivolumab (OPDIVO) 40 MG/4ML SOLN chemo injection Inject into the vein.  olmesartan (BENICAR) 40 MG tablet Take 40 mg by mouth daily.     olmesartan (BENICAR) 5 MG tablet Take 10 mg by mouth daily.     pramipexole (MIRAPEX) 1 MG tablet Take 1 tablet (1 mg total) by mouth 3 (three) times daily. 270 tablet 2   Vitamin D, Ergocalciferol, (DRISDOL) 1.25 MG (50000 UNIT) CAPS capsule Take 50,000 Units by mouth every 7 (seven) days.     carbidopa-levodopa (SINEMET IR) 25-100 MG tablet 2 tablets at 7 AM, 2 tablets at 11 AM, 1 tablet at 3 PM, 1 tablet at 7 PM 540 tablet 1   No current facility-administered medications on file prior to visit.    Review of Systems:  As per HPI- otherwise negative.   Physical Examination: There were no vitals filed for this visit. Vitals:   05/10/21 1018  Weight: 175 lb (79.4 kg)  Height: 5' 10"  (1.778 m)   Body mass index is 25.11 kg/m. Ideal Body Weight: Weight in (lb) to have BMI = 25: 173.9  Pt observed via video monitor.  He looks well, his normal self.  Per his report he has lost about 10 pounds over the last month  Assessment and Plan: Lumbar pain with radiation down left leg - Plan: traMADol (ULTRAM) 50 MG tablet  PD (Parkinson's disease) ([place])  Hodgkin's disease  Virtual visit today to discuss a few concerns.  Michelle notes that his Parkinson's disease symptoms are becoming somewhat more severe, he is having more frequent falls.  He is taking his medication as directed and following up with neurology  There is some  concern about recurrence of his cancer.  He is having a PET scan next week  Finally, he notes persistent lower back pain and even more troublesome is leg pain.  He has tried hydrocodone and oxycodone, neither seem to be helpful.  We will have him try tramadol in case this may give him some relief.  Advised caution when using this medication due to sedation  He will let me know what else I can do to help  Signed Lamar Blinks, MD

## 2021-05-15 ENCOUNTER — Ambulatory Visit (HOSPITAL_COMMUNITY)
Admission: RE | Admit: 2021-05-15 | Discharge: 2021-05-15 | Disposition: A | Discharge status: Home / Self Care | Payer: Commercial Managed Care - PPO | Source: Ambulatory Visit | Attending: Neurosurgery | Admitting: Neurosurgery

## 2021-05-15 ENCOUNTER — Other Ambulatory Visit: Payer: Self-pay

## 2021-05-15 DIAGNOSIS — M5416 Radiculopathy, lumbar region: Secondary | ICD-10-CM

## 2021-05-15 MED ORDER — GADOBUTROL 1 MMOL/ML IV SOLN
10.0000 mL | Freq: Once | INTRAVENOUS | Status: AC | PRN
  Administered 2021-05-15: 7 mL via INTRAVENOUS

## 2021-05-17 ENCOUNTER — Other Ambulatory Visit: Payer: Self-pay | Admitting: Neurosurgery

## 2021-05-17 DIAGNOSIS — M5416 Radiculopathy, lumbar region: Secondary | ICD-10-CM

## 2021-05-18 ENCOUNTER — Ambulatory Visit
Admission: RE | Admit: 2021-05-18 | Discharge: 2021-05-18 | Disposition: A | Discharge status: Home / Self Care | Payer: Commercial Managed Care - PPO | Source: Ambulatory Visit | Attending: Neurosurgery | Admitting: Neurosurgery

## 2021-05-18 ENCOUNTER — Other Ambulatory Visit: Payer: Self-pay

## 2021-05-18 ENCOUNTER — Ambulatory Visit (HOSPITAL_COMMUNITY)
Admission: RE | Admit: 2021-05-18 | Discharge: 2021-05-18 | Disposition: A | Discharge status: Home / Self Care | Payer: Commercial Managed Care - PPO | Source: Ambulatory Visit | Attending: Hematology & Oncology | Admitting: Hematology & Oncology

## 2021-05-18 DIAGNOSIS — C8195 Hodgkin lymphoma, unspecified, lymph nodes of inguinal region and lower limb: Secondary | ICD-10-CM | POA: Diagnosis not present

## 2021-05-18 DIAGNOSIS — M5416 Radiculopathy, lumbar region: Secondary | ICD-10-CM

## 2021-05-18 LAB — GLUCOSE, CAPILLARY: Glucose-Capillary: 105 mg/dL — ABNORMAL HIGH (ref 70–99)

## 2021-05-18 MED ORDER — METHYLPREDNISOLONE ACETATE 40 MG/ML INJ SUSP (RADIOLOG
80.0000 mg | Freq: Once | INTRAMUSCULAR | Status: AC
  Administered 2021-05-18: 80 mg via EPIDURAL

## 2021-05-18 MED ORDER — IOPAMIDOL (ISOVUE-M 200) INJECTION 41%
1.0000 mL | Freq: Once | INTRAMUSCULAR | Status: AC
  Administered 2021-05-18: 1 mL via EPIDURAL

## 2021-05-18 MED ORDER — FLUDEOXYGLUCOSE F - 18 (FDG) INJECTION
8.9400 | Freq: Once | INTRAVENOUS | Status: AC
  Administered 2021-05-18: 8.94 via INTRAVENOUS

## 2021-05-18 NOTE — Discharge Instructions (Signed)

## 2021-05-19 ENCOUNTER — Telehealth: Payer: Self-pay | Admitting: *Deleted

## 2021-05-19 NOTE — Telephone Encounter (Signed)
Per scheduling message Brian [name] 05/19/21- called patient and gave upcoming appointments - patient confirmed

## 2021-05-19 NOTE — Telephone Encounter (Signed)
Call placed to patient to notify him per order of Dr. Marin Olp that there is "No evidence of lymphoma recurrence on skull base to thigh."  Pt appreciative of call and has no questions at this time.

## 2021-06-06 ENCOUNTER — Ambulatory Visit: Payer: Commercial Managed Care - PPO | Admitting: Neurology

## 2021-06-07 ENCOUNTER — Telehealth: Payer: Self-pay | Admitting: *Deleted

## 2021-06-07 NOTE — Telephone Encounter (Signed)
Caller Name [place] Phone Number [phone] Patient Name Brian [name] Patient DOB 05-18-1961 Call Type Message Only Information Provided Reason for Call Request for General Office Information Initial Comment Caller states there is a mole on his back and would like advice about it. Additional Comment Provided hours. Declined triage. Will need someone to physically look at it. Disp. Time Disposition Final User 06/07/2021 12:43:55 PM General Information Provided Yes Reather Littler

## 2021-06-08 NOTE — Telephone Encounter (Signed)
Needs appt please

## 2021-06-12 NOTE — Progress Notes (Signed)
Assessment/Plan:   1.  Parkinsons Disease  - take carbidopa/levodopa 25/100 and take 2 tablets at 7 AM, 2 tablets at 11 AM, 1 tablet at 3 PM, 1 tablet at 7 PM.             -continue carbidopa/levodopa 50/200 at bedtime.             -continue pramipexole 1 mg tid.  He asks me about the Neupro patch.  We had discussed it before, but he did not remember that.  After some discussion today, he decided to hold off on it because of cost, but he may really entertain that here soon as he will have met his deductible.  -discussed PT but he is having trouble walking so decided to hold on that.  He is still riding bike for exercise.  -start amantadine 100 mg bid.  R/B/SE were discussed.  The opportunity to ask questions was given and they were answered to the best of my ability.  The patient expressed understanding and willingness to follow the outlined treatment protocols.  -discussed walker and would recommend this given number of falls.  -Discussed with him the importance of following up with dermatology on a regular basis.  He had knee look at his back, and while I did not see any worrisome skin lesions, I told him that Parkinson's slightly increases risk of melanoma and he should be following up with dermatology regularly.  He has a Paediatric nurse already at [facility] dermatology.   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  Patient has symptomatic reinnervation of the right face.   3.  Hodgkin's lymphoma             -Doing well from that standpoint.  Following with oncology.   4.  Renal insufficiency             -monitoring.  Mild.  No need to change pramipexole   5. Lightheadedness/dizziness             -Reports some syncope/near syncope with reported low blood pressures.  May need to have his Benicar reduced even further.  I will send a note to his primary care physician, although the patient may need to follow-up with her.   6.  Lumbar radiculopathy             -Patient  had microdiscectomy Mar 29, 2021  -Microdiscectomy complicated by subdural CSF fluid collection at L1 vertebral body and L5-S1 level.  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  -Patient following with Dr. Marcello Moores of neurosurgery.  Has asked for 2nd opinion at [facility].  Reports that his [facility] oncology coordinator is supposed to be helping him set this up. Subjective:   DEARIES Brian [name] was seen today in follow up for Parkinsons disease.  My previous records were reviewed prior to todays visit as well as outside records available to me. Pt denies falls.  Pt denies lightheadedness, near syncope.  No hallucinations.  Patient did have a microdiscectomy in May 4, but not long after that on Apr 18, 2021 he went to the emergency room with back pain.  Those notes are reviewed.  Emergency room work-up was negative.  He ended up having a video visit with his primary care on June  15 regarding that.  He noted that he was having excessive pain.  He tried hydrocodone and oxycodone without relief.  They gave him some tramadol.  His surgeon ordered an MRI with and without gadolinium, which was done on June 20.  He was noted a 3 x 3 x 2 cm postoperative fluid collection at the L4-L5 level that partially encouraged on the left dorsal lateral aspect of the spinal canal.  This contributed to persistent moderate/severe left subarticular stenosis with flattening of the left L5 nerve root.  In addition, compared to prior preop MRI, there was ventral displacement of the cauda equina nerve roots at the L1 vertebral body and L5-S1 level, and a new dorsal subdural CSF fluid collection was suspected.  He subsequently had an epidural injection on June 23. He is having trouble walking because of it and has to use a handicap sticker now.  States that he has terrible L leg pain and has to use a "jug" next to the bed.  He  has fallen 5 times in the last month, some that he attributes to L leg spasm and some to festination.  noting some BP drops with some syncope/near syncope.  He has taken blood pressure when the events happen and it has been low.  Benicar has been reduced, but reports that that has been about 6 months ago.  He is looking for a 2nd opinion and called [facility] about that. Separately, the patient continues to follow with oncology.  His most recent PET scan was negative.  Current prescribed movement disorder medications: carbidopa/levodopa 25/100 and take 2 tablets at 7 AM, 2 tablets at 11 AM, 1 tablet at 3 PM, 1 tablet at 7 PM (increased) carbidopa/levodopa 50/200 CR q hs (pt stopped that) Pramipexole 1 mg tid   PREVIOUS MEDICATIONS: carbidopa/levodopa 50/200 CR (d/c just because tried it for pain and still waking up with pain in back so d/c it);   ALLERGIES:  No Known Allergies  CURRENT MEDICATIONS:  Outpatient Encounter Medications as of 06/13/2021  Medication Sig   aspirin EC 81 MG tablet Take 81 mg by mouth daily. Swallow whole.   carbidopa-levodopa (SINEMET IR) 25-100 MG tablet 2 tablets at 7 AM, 2 tablets at 11 AM, 1 tablet at 3 PM, 1 tablet at 7 PM (Patient taking differently: 2 tablets at 7 AM, 2 tablets at 11 AM, 1 tablet at 3 PM)   docusate sodium (COLACE) 100 MG capsule Take by mouth.   gabapentin (NEURONTIN) 300 MG capsule Take by mouth.   nivolumab (OPDIVO) 40 MG/4ML SOLN chemo injection Inject into the vein.   olmesartan (BENICAR) 40 MG tablet Take 40 mg by mouth daily.   olmesartan (BENICAR) 5 MG tablet Take 10 mg by mouth daily.   pramipexole (MIRAPEX) 1 MG tablet Take 1 tablet (1 mg total) by mouth 3 (three) times daily.   Vitamin D, Ergocalciferol, (DRISDOL) 1.25 MG (50000 UNIT) CAPS capsule Take 50,000 Units by mouth every 7 (seven) days.   No facility-administered encounter medications on file as of 06/13/2021.    Objective:   PHYSICAL EXAMINATION:    VITALS:   Vitals:    06/13/21 1118  BP: 106/70  Pulse: 100  SpO2: 96%  Weight: 177 lb (80.3 kg)  Height: 5' 10"  (1.778 m)    GEN:  The patient appears stated age and is in NAD. HEENT:  Normocephalic, atraumatic.  The mucous membranes are moist. The superficial temporal arteries are without ropiness or tenderness. CV:  RRR Lungs:  CTAB Neck/HEME:  There are no carotid bruits bilaterally.  Neurological examination:  Orientation: The patient is alert and oriented x3. Cranial nerves: There is good facial symmetry with facial hypomimia. The speech is fluent and clear. Soft palate rises symmetrically and there is no tongue deviation. Hearing is intact to conversational tone. Sensation: Sensation is intact to light touch throughout Motor: Strength is at least antigravity x4.  Movement examination: Tone: There is normal tone in the normal Abnormal movements: there is bilateral LE dyskinesia Coordination:  There is no decremation with RAM's Gait and Station: The patient pushes off to arise.  Once out in the hall, he actually does fairly well, but when he gets back to the chair he nearly misses it, but catches himself before he falls.    I have reviewed and interpreted the following labs independently    Chemistry      Component Value Date/Time   NA 138 04/11/2021 1104   NA 142 04/02/2017 0000   NA 140 07/19/2016 1305   K 4.9 04/11/2021 1104   K 4.4 07/19/2016 1305   CL 104 04/11/2021 1104   CL 105 02/08/2016 1117   CL 107 03/25/2013 0828   CO2 28 04/11/2021 1104   CO2 26 07/19/2016 1305   BUN 42 (H) 04/11/2021 1104   BUN 21 04/02/2017 0000   BUN 22.8 07/19/2016 1305   CREATININE 1.15 04/11/2021 1104   CREATININE 1.1 07/19/2016 1305   GLU 105 04/02/2017 0000      Component Value Date/Time   CALCIUM 9.8 04/11/2021 1104   CALCIUM 9.8 07/19/2016 1305   ALKPHOS 88 04/11/2021 1104   ALKPHOS 76 07/19/2016 1305   AST 15 04/11/2021 1104   AST 18 07/19/2016 1305   ALT 3 04/11/2021 1104   ALT 14  07/19/2016 1305   BILITOT 0.4 04/11/2021 1104   BILITOT 0.70 07/19/2016 1305       Lab Results  Component Value Date   WBC 6.3 04/11/2021   HGB 12.8 (L) 04/11/2021   HCT 38.7 (L) 04/11/2021   MCV 97.5 04/11/2021   PLT 280 04/11/2021    Lab Results  Component Value Date   TSH 3.484 12/08/2020     Total time spent on today's visit was 40 minutes, including both face-to-face time and nonface-to-face time.  Time included that spent on review of records (prior notes available to me/labs/imaging if pertinent), discussing treatment and goals, answering patient's questions and coordinating care.  Cc:  Copland, [name], MD

## 2021-06-13 ENCOUNTER — Other Ambulatory Visit: Payer: Self-pay

## 2021-06-13 ENCOUNTER — Encounter: Payer: Self-pay | Admitting: Neurology

## 2021-06-13 ENCOUNTER — Ambulatory Visit (INDEPENDENT_AMBULATORY_CARE_PROVIDER_SITE_OTHER): Payer: Commercial Managed Care - PPO | Admitting: Neurology

## 2021-06-13 VITALS — BP 106/70 | HR 100 | Ht 70.0 in | Wt 177.0 lb

## 2021-06-13 DIAGNOSIS — G20 Parkinson's disease: Secondary | ICD-10-CM | POA: Diagnosis not present

## 2021-06-13 DIAGNOSIS — G249 Dystonia, unspecified: Secondary | ICD-10-CM

## 2021-06-13 DIAGNOSIS — G903 Multi-system degeneration of the autonomic nervous system: Secondary | ICD-10-CM | POA: Diagnosis not present

## 2021-06-13 MED ORDER — AMANTADINE HCL 100 MG PO CAPS: 100.0000 mg | ORAL_CAPSULE | Freq: Two times a day (BID) | ORAL | 1 refills | Status: DC

## 2021-06-13 NOTE — Patient Instructions (Signed)
Start amantadine 100 mg twice per day Follow up with [facility] The name you asked about is Dr. Phylliss Blakes, MD at [facility] neurosurgery I will send a note to Dr. Lorelei Pont about the BP medication.  The physicians and staff at [facility] are committed to providing excellent care. You may receive a survey requesting feedback about your experience at our office. We strive to receive "very good" responses to the survey questions. If you feel that your experience would prevent you from giving the office a "very good " response, please contact our office to try to remedy the situation. We may be reached at [phone]. Thank you for taking the time out of your busy day to complete the survey.

## 2021-06-21 ENCOUNTER — Other Ambulatory Visit (HOSPITAL_COMMUNITY): Payer: Self-pay | Admitting: Neurosurgery

## 2021-06-21 DIAGNOSIS — M79605 Pain in left leg: Secondary | ICD-10-CM

## 2021-06-22 ENCOUNTER — Ambulatory Visit (HOSPITAL_COMMUNITY)
Admission: RE | Admit: 2021-06-22 | Discharge: 2021-06-22 | Disposition: A | Discharge status: Home / Self Care | Payer: Commercial Managed Care - PPO | Source: Ambulatory Visit | Attending: Cardiology | Admitting: Cardiology

## 2021-06-22 ENCOUNTER — Other Ambulatory Visit: Payer: Self-pay

## 2021-06-22 DIAGNOSIS — M79605 Pain in left leg: Secondary | ICD-10-CM

## 2021-06-26 ENCOUNTER — Encounter: Payer: Self-pay | Admitting: Family Medicine

## 2021-06-26 ENCOUNTER — Telehealth: Payer: Self-pay

## 2021-06-26 DIAGNOSIS — M545 Low back pain, unspecified: Secondary | ICD-10-CM

## 2021-06-26 NOTE — Telephone Encounter (Signed)
I have pended referral. Please sign if appropriate.

## 2021-06-26 NOTE — Telephone Encounter (Signed)
Pt called in would a referral to neurosurgery for leg pain.

## 2021-06-26 NOTE — Telephone Encounter (Signed)
Called him back- 3 months go he had a microdiskectomy back in May of this year  About 2 weeks later he started having severe left leg pain He has not had any success with pain medication - we have tried several   He had a repeat MRI on 6/20 as follows: IMPRESSION: Comparison is made to the prior lumbar spine MRI of 10/23/2020.   At L4-L5, there has been interval left laminotomy and discectomy. 3.2 x 3.3 x 1.8 cm postoperative fluid collection at the laminotomy site, partly encroaching upon the left dorsal lateral aspect of the spinal canal. This contributes to persistent multifactorial moderate/severe left subarticular stenosis with potential to affect the descending left L5 nerve root. Moderate right subarticular and moderate/severe central canal stenosis also present at this level. Bilateral neural foraminal narrowing (moderate right, severe left).   Also new from the prior MRI, there is ventral displacement of the cauda equina nerve roots within the lumbar spinal canal, most notably at the L1 vertebral body level and L5-S1 level. Given these findings, a new dorsal subdural CSF intensity collection is suspected.   Lumbar spondylosis is otherwise unchanged. No more than mild central canal stenosis at the remaining levels. Additional sites of subarticular stenosis, as detailed. Most notably, moderate subarticular stenosis is present on the right at L2-L3, and on the left at L3-L4.   Additional sites of neural foraminal narrowing, as detailed and greatest on the left at L3-L4 (moderate/severe).   Persistent moderate degenerative endplate edema with enhancement at L2-L3, L3-L4 and L4-L5.   Persistent edema with enhancement within the bilateral L4 and L5 pedicles, which may be degenerative or may reflect stress reaction.   Pt described his sx to me  He reports he saw neurosurgery just recently but is still not sure what the plan is as far as his pain.  He is asking me what to do  about his back, I explained that this is really beyond my scope of practice  We agreed that I will call neurosurgery tomorrow and communicate that he is having pain.

## 2021-06-27 ENCOUNTER — Other Ambulatory Visit: Payer: Self-pay

## 2021-06-27 ENCOUNTER — Encounter (HOSPITAL_COMMUNITY): Payer: Self-pay

## 2021-06-27 ENCOUNTER — Emergency Department (HOSPITAL_COMMUNITY)
Admission: EM | Admit: 2021-06-27 | Discharge: 2021-06-27 | Disposition: A | Discharge status: Home / Self Care | Payer: Commercial Managed Care - PPO | Attending: Emergency Medicine | Admitting: Emergency Medicine

## 2021-06-27 ENCOUNTER — Telehealth: Payer: Self-pay | Admitting: *Deleted

## 2021-06-27 DIAGNOSIS — M5416 Radiculopathy, lumbar region: Secondary | ICD-10-CM | POA: Insufficient documentation

## 2021-06-27 DIAGNOSIS — M48061 Spinal stenosis, lumbar region without neurogenic claudication: Secondary | ICD-10-CM | POA: Insufficient documentation

## 2021-06-27 DIAGNOSIS — Z8571 Personal history of Hodgkin lymphoma: Secondary | ICD-10-CM | POA: Diagnosis not present

## 2021-06-27 DIAGNOSIS — G20 Parkinson's disease: Secondary | ICD-10-CM | POA: Insufficient documentation

## 2021-06-27 DIAGNOSIS — M79605 Pain in left leg: Secondary | ICD-10-CM | POA: Diagnosis present

## 2021-06-27 DIAGNOSIS — Z7982 Long term (current) use of aspirin: Secondary | ICD-10-CM | POA: Insufficient documentation

## 2021-06-27 MED ORDER — HYDROMORPHONE HCL 1 MG/ML IJ SOLN
1.0000 mg | Freq: Once | INTRAMUSCULAR | Status: AC
Start: 2021-06-27 — End: 2021-06-27
  Administered 2021-06-27: 1 mg via INTRAVENOUS
  Filled 2021-06-27: qty 1

## 2021-06-27 NOTE — ED Provider Notes (Signed)
[facility] DEPT Provider Note   CSN: [id number] Arrival date & time: 06/27/21  0953     History Chief Complaint  Patient presents with   Leg Pain    Brian [name] is a 60 y.o. male.   Leg Pain  Patient presents ED with complaints of persistent left lower extremity pain.  Patient has been dealing with lumbar radiculopathy pain.  Patient had surgery 3 months ago.  Ever since then he has had persistent pain in his left leg.  He feels like it is getting worse.  Patient has some intermittent numbness and tingling.  Pain severity also waxes and wanes but gets worse whenever he tries to get up and move around.  Patient has had MRIs that have shown lumbar spondylosis.  Additional sites of neuroforaminal narrowing.  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  Patient has seen his neurosurgeon.  Dr. Marcello Moores recommended additional surgery.  Patient has been hesitant to proceed right away and is interested in a second opinion.  Patient came to the ED because he continues to have this pain.  Past Medical History:  Diagnosis Date   Anxiety    Dysrhythmia    past hx pvc   Goals of care, counseling/discussion 09/25/2018   H/O autologous stem cell transplant ([place])    may 2014  at [place]   History of Bell's palsy    2009  RIGHT SIDE--  HAS 80% FUNCTION / PT STATES A LITTLE ASYMETRICAL AND EFFECTS MOUTH   History of radiation therapy 10/18/17-11/05/17   sprine T1 26 Gy in 13 fractions, spine boost 10 Gy in 5 fractions   Hodgkin's disease, nodular sclerosis, of inguinal region/lower limb ([place]) ONOLOGIST--  DR [facility] AND A [facility]     SALVAGE CHEMO 2013/  AUTOLOGUS STEM CELL TRANSPLANT MAY 2014 AT [facility]   Mass of right chest wall 09/18/2018   Mass of right inguinal region    PVC (premature ventricular contraction)    "benign"   TIA (transient ischemic attack) 02/2015   "probable TIA"   Wears contact lenses      Patient Active Problem List   Diagnosis Date Noted   Goals of care, counseling/discussion 09/25/2018   Mass of right chest wall 09/18/2018   PD (Parkinson's disease) ([place]) 11/27/2016   Lymphadenopathy 07/28/2015   Mediastinal adenopathy 07/14/2015   Aphasia 03/04/2015   Slurred speech 03/04/2015   Pleural mass 05/31/2014   Abdominal pain, epigastric 02/02/2014   Weight loss 08/12/2012   H/O Bell's palsy 08/12/2012   Hodgkin's disease 08/09/2011    Past Surgical History:  Procedure Laterality Date   AXILLARY LYMPH NODE BIOPSY Left 02/02/2013   Procedure: NEEDLE LOCALIZED AXILLARY LYMPH NODE BIOPSY;  Surgeon: Haywood Lasso, MD;  Location: [facility];  Service: General;  Laterality: Left;   BACK SURGERY  03/2021   BONE MARROW BIOPSY  08/26/2012   CYST REMOVAL NECK Left 11/26/1978   LEFT INGUINAL LYMPH NODE BX  09/09/2012   LYMPH NODE BIOPSY N/A 07/28/2015   Procedure: LYMPH NODE BIOPSY;  Surgeon: Melrose Nakayama, MD;  Location: [facility];  Service: Thoracic;  Laterality: N/A;   MASS EXCISION Right 09/19/2018   Procedure: EXCISION OF RIGHT CHEST WALL MASS ERAS PATHWAY;  Surgeon: Fanny Skates, MD;  Location: [facility];  Service: General;  Laterality: Right;   NODE DISSECTION Right 07/28/2015   Procedure: NODE DISSECTION;  Surgeon: Melrose Nakayama, MD;  Location[facility];  Service: Thoracic;  Laterality: Right;   PLEURA BIOPSY Left 05/31/2014   REMOVAL RIGHT INGUINAL LYMPH NODES  08/16/2011   SCROTAL EXPLORATION Right 01/04/2014   Procedure: SCROTUM EXPLORATION   INGUINAL , EXCISION OF CYSTIC MASS OF RIGHT SPERMATIC CORD, WITH FROZEN SECTION;  Surgeon: Franchot Gallo, MD;  Location: [facility];  Service: Urology;  Laterality: Right;   TRANSTHORACIC ECHOCARDIOGRAM  03/18/2013   MILD LVH/  EF 55-60%   VIDEO ASSISTED THORACOSCOPY Left 05/31/2014   Procedure: LEFT VIDEO ASSISTED THORACOSCOPY, PLEURAL BIOPSY;  Surgeon:  Melrose Nakayama, MD;  Location: [facility];  Service: Thoracic;  Laterality: Left;   VIDEO ASSISTED THORACOSCOPY Right 07/28/2015   Procedure: RIGHT VIDEO ASSISTED THORACOSCOPY;  Surgeon: Melrose Nakayama, MD;  Location: [facility];  Service: Thoracic;  Laterality: Right;   VIDEO ASSISTED THORACOSCOPY (VATS)/ LYMPH NODE SAMPLING Right 07/28/2015   VIDEO ASSISTED THORACOSCOPY (VATS)/WEDGE RESECTION  05/31/2014   VIDEO BRONCHOSCOPY WITH ENDOBRONCHIAL ULTRASOUND  07/28/2015   VIDEO BRONCHOSCOPY WITH ENDOBRONCHIAL ULTRASOUND N/A 07/28/2015   Procedure: VIDEO BRONCHOSCOPY WITH ENDOBRONCHIAL ULTRASOUND;  Surgeon: Melrose Nakayama, MD;  Location[facility];  Service: Thoracic;  Laterality: N/A;       Family History  Problem Relation Age of Onset   Cancer Mother 12       ovarian   Ovarian cancer Mother 95   Heart disease Father    Stroke Father    Cancer Brother        testicular   Hypertension Brother    Healthy Son    Healthy Son    Hypertension Brother    Other Brother        CMT   Hypertension Brother     Social History   Tobacco Use   Smoking status: Never   Smokeless tobacco: Never  Vaping Use   Vaping Use: Never used  Substance Use Topics   Alcohol use: Yes    Alcohol/week: 14.0 standard drinks    Types: 7 Cans of beer, 7 Glasses of wine per week   Drug use: No    Home Medications Prior to Admission medications   Medication Sig Start Date End Date Taking? Authorizing Provider  amantadine (SYMMETREL) 100 MG capsule Take 1 capsule (100 mg total) by mouth 2 (two) times daily. 06/13/21   Tat, Eustace Quail, DO  aspirin EC 81 MG tablet Take 81 mg by mouth daily. Swallow whole.    [provider]  carbidopa-levodopa (SINEMET IR) 25-100 MG tablet 2 tablets at 7 AM, 2 tablets at 11 AM, 1 tablet at 3 PM, 1 tablet at 7 PM Patient taking differently: 2 tablets at 7 AM, 2 tablets at 11 AM, 1 tablet at 3 PM 02/01/21   Tat, Rebecca S, DO  docusate sodium (COLACE) 100 MG capsule  Take by mouth. 03/29/21   [provider]  gabapentin (NEURONTIN) 300 MG capsule Take by mouth. 03/30/21   [provider]  nivolumab (OPDIVO) 40 MG/4ML SOLN chemo injection Inject into the vein.    [provider]  olmesartan (BENICAR) 40 MG tablet Take 40 mg by mouth daily. 03/16/21   [provider]  olmesartan (BENICAR) 5 MG tablet Take 10 mg by mouth daily.    [provider]  pramipexole (MIRAPEX) 1 MG tablet Take 1 tablet (1 mg total) by mouth 3 (three) times daily. 11/28/20   Tat, Eustace Quail, DO  Vitamin D, Ergocalciferol, (DRISDOL) 1.25 MG (50000 UNIT) CAPS capsule Take  50,000 Units by mouth every 7 (seven) days.    [provider]    Allergies    Patient has no known allergies.  Review of Systems   Review of Systems  All other systems reviewed and are negative.  Physical Exam Updated Vital Signs BP (!) 141/87 (BP Location: Right Arm)   Pulse 82   Temp 97.8 F (36.6 C) (Oral)   Resp 18   Ht 1.778 m (5' 10")   Wt 79.4 kg   SpO2 100%   BMI 25.11 kg/m   Physical Exam Vitals and nursing note reviewed.  Constitutional:      General: He is not in acute distress.    Appearance: He is well-developed.  HENT:     Head: Normocephalic and atraumatic.     Right Ear: External ear normal.     Left Ear: External ear normal.  Eyes:     General: No scleral icterus.       Right eye: No discharge.        Left eye: No discharge.     Conjunctiva/sclera: Conjunctivae normal.  Neck:     Trachea: No tracheal deviation.  Cardiovascular:     Rate and Rhythm: Normal rate.  Pulmonary:     Effort: Pulmonary effort is normal. No respiratory distress.     Breath sounds: No stridor.  Abdominal:     General: There is no distension.  Musculoskeletal:        General: No swelling or deformity.     Cervical back: Neck supple.     Right lower leg: No edema.     Left lower leg: No edema.     Comments: No spinal tenderness, lower extremities well  perfused, positive straight leg raise,  Skin:    General: Skin is warm and dry.     Findings: No rash.  Neurological:     General: No focal deficit present.     Mental Status: He is alert.     Cranial Nerves: Cranial nerve deficit: no gross deficits.     Comments: Normal plantar flexion dorsiflexion strength, sensation intact    ED Results / Procedures / Treatments   Labs (all labs ordered are listed, but only abnormal results are displayed) Labs Reviewed - No data to display  EKG None  Radiology No results found.  Procedures Procedures   Medications Ordered in ED Medications  HYDROmorphone (DILAUDID) injection 1 mg (1 mg Intravenous Given 06/27/21 1124)    ED Course  I have reviewed the triage vital signs and the nursing notes.  Pertinent labs & imaging results that were available during my care of the patient were reviewed by me and considered in my medical decision making (see chart for details).    MDM Rules/Calculators/A&P                           Patient is having persistent pain associated with lumbar radiculopathy.  Patient has been taking Neurontin.  Is also been taking Ultram.  States he tried lidocaine patches in the past.  Patient is interested in getting definitive answer.  I suggested he follow-up with his neurosurgeon but he is currently interested in having a second opinion at an alternative facility.  Patient is trying to reach out to [facility] or [facility].  I do not feel the patient requires any acute surgical intervention but I do think he will need to see a neurosurgeon to treat his symptoms.  Patient  interested in possible pain management treatment. Final Clinical Impression(s) / ED Diagnoses Final diagnoses:  Lumbar radicular pain    Rx / DC Orders ED Discharge Orders     None        Dorie Rank, MD 06/27/21 1213

## 2021-06-27 NOTE — ED Triage Notes (Signed)
Patient reports that he had lumbar surgery 3 months ago and has had left leg pain that is worsening. Patient also c/o numbness and tingling. Patient has MRI x 2 and [place] that patient states were negtaive.

## 2021-06-27 NOTE — Discharge Instructions (Addendum)
Continue your current medications.  Follow-up with neurosurgery as planned.  Consider seeing a pain management doctor for further treatment

## 2021-06-27 NOTE — Telephone Encounter (Signed)
Patient went to ED

## 2021-06-27 NOTE — Telephone Encounter (Signed)
Called [facility] and let them know that pt has been reaching out to me with pain, but I am not able to address his NSG issues for him I spoke with staff member Sophia - they have a follow-up plan in place and seem to be proceeding appropriately.  They spoke with his GF this am but he was in the ER

## 2021-06-27 NOTE — Telephone Encounter (Signed)
Who Is Calling Patient / Member / Family / Caregiver Call Type Triage / Clinical Caller Name Lorenso Quarry Relationship To Patient Partner Return Phone Number [phone] (Primary) Chief Complaint Leg Pain Reason for Call Symptomatic / Request for Health Information Initial Comment Caller states she is calling on behalf of her husband. Caller states he had back surgery three months ago. Caller states he is having pain and tingling on his left leg. Translation No Nurse Assessment Nurse: Clovis Riley, RN, Georgina Peer Date/Time (Eastern Time): 06/27/2021 8:16:37 AM Confirm and document reason for call. If symptomatic, describe symptoms. ---Caller states her partner had back surgery three months ago. Caller states he is having pain and tingling on his left leg. States they spoke to his PCP yesterday. States he is unable to walk. States he cannot put any weight on the left leg. He is on tramadol, gabepentin, and oxycodone.

## 2021-07-04 ENCOUNTER — Telehealth: Payer: Self-pay | Admitting: Neurology

## 2021-07-04 NOTE — Telephone Encounter (Signed)
Pt called in wanting to give an update. He had surgery in May. He has been having trouble walking not related to the surgery. He is not sure if something can be done to help him walk better?

## 2021-07-04 NOTE — Telephone Encounter (Signed)
I saw him in July and he was having severe pain in leg after with some complications from surgery when I saw him just a few weeks ago.  He was planning on getting another opinion at [facility].  Why does he now think that the walking issue is from Parkinsons Disease?

## 2021-07-05 NOTE — Telephone Encounter (Signed)
Patient called in and I informed patient per Dr. Carles Collet that she saw him in July and he was having severe pain in leg after with some complications from surgery when Dr. Carles Collet saw him just a few weeks ago. Informed patient that Dr. Carles Collet said that he was planning on getting another opinion at [facility].  Patient stated that he is on the way to his appt at [facility] right now.   Also per Dr. Carles Collet asked patient Why does he now think that the walking issue is from Parkinsons Disease? Patient stated he doesn't have an answer to that question.   Patient had no further questions or complaints. He stated he just wanted to let Dr. Carles Collet know what was going on.

## 2021-07-10 ENCOUNTER — Telehealth: Payer: Self-pay | Admitting: *Deleted

## 2021-07-10 NOTE — Telephone Encounter (Signed)
Returned patient's phone call regarding updates for Dr. Marin Olp. He wants Dr. Marin Olp to be aware of his recent appointments at [facility]. He sees Dr. Serita Butcher at [facility] next week. He is a Research officer, trade [employer]. All office notes are in [place]. This message will be given to Dr. Marin Olp.

## 2021-08-02 ENCOUNTER — Telehealth: Payer: Self-pay | Admitting: *Deleted

## 2021-08-02 NOTE — Telephone Encounter (Signed)
Call received from patient to inform Dr. Marin Olp that he would like to hold on taking Nivolumab on 08/24/21 d/t his concerns that the Nivolumab is adding to neuro issues with his leg.  Dr. Marin Olp notified.

## 2021-08-08 ENCOUNTER — Encounter: Payer: Self-pay | Admitting: Family Medicine

## 2021-08-08 ENCOUNTER — Telehealth: Payer: Self-pay

## 2021-08-08 DIAGNOSIS — M79605 Pain in left leg: Secondary | ICD-10-CM

## 2021-08-08 DIAGNOSIS — G20 Parkinson's disease: Secondary | ICD-10-CM

## 2021-08-08 DIAGNOSIS — M545 Low back pain, unspecified: Secondary | ICD-10-CM

## 2021-08-08 NOTE — Telephone Encounter (Signed)
Pt called to request a referral to see Dr. Leeroy Cha, [place] with [facility].  He stated you are aware of his condition regarding the spinal surgery he had and the issues he is having with severe left leg pain.  He heard about Dr. Ranell Patrick and is wanting to go see her for help with this.

## 2021-08-08 NOTE — Telephone Encounter (Signed)
Ok to place order 

## 2021-08-15 ENCOUNTER — Encounter: Payer: Self-pay | Admitting: Family Medicine

## 2021-08-18 ENCOUNTER — Encounter: Payer: Self-pay | Admitting: Hematology & Oncology

## 2021-08-21 ENCOUNTER — Encounter: Payer: Self-pay | Admitting: Hematology & Oncology

## 2021-08-24 ENCOUNTER — Encounter: Payer: Self-pay | Admitting: Hematology & Oncology

## 2021-08-24 ENCOUNTER — Inpatient Hospital Stay: Payer: Commercial Managed Care - PPO | Attending: Hematology & Oncology

## 2021-08-24 ENCOUNTER — Ambulatory Visit: Payer: Commercial Managed Care - PPO

## 2021-08-24 ENCOUNTER — Inpatient Hospital Stay (HOSPITAL_BASED_OUTPATIENT_CLINIC_OR_DEPARTMENT_OTHER): Payer: Commercial Managed Care - PPO | Admitting: Hematology & Oncology

## 2021-08-24 ENCOUNTER — Encounter: Payer: Self-pay | Admitting: Family Medicine

## 2021-08-24 ENCOUNTER — Other Ambulatory Visit: Payer: Self-pay

## 2021-08-24 ENCOUNTER — Other Ambulatory Visit (HOSPITAL_BASED_OUTPATIENT_CLINIC_OR_DEPARTMENT_OTHER): Payer: Self-pay

## 2021-08-24 VITALS — BP 139/93 | HR 87 | Temp 97.7°F | Resp 18 | Wt 178.0 lb

## 2021-08-24 DIAGNOSIS — Z923 Personal history of irradiation: Secondary | ICD-10-CM | POA: Diagnosis not present

## 2021-08-24 DIAGNOSIS — Z7982 Long term (current) use of aspirin: Secondary | ICD-10-CM | POA: Insufficient documentation

## 2021-08-24 DIAGNOSIS — C8195 Hodgkin lymphoma, unspecified, lymph nodes of inguinal region and lower limb: Secondary | ICD-10-CM

## 2021-08-24 DIAGNOSIS — Z9484 Stem cells transplant status: Secondary | ICD-10-CM | POA: Insufficient documentation

## 2021-08-24 DIAGNOSIS — G20 Parkinson's disease: Secondary | ICD-10-CM | POA: Insufficient documentation

## 2021-08-24 DIAGNOSIS — Z9221 Personal history of antineoplastic chemotherapy: Secondary | ICD-10-CM | POA: Diagnosis not present

## 2021-08-24 DIAGNOSIS — Z8571 Personal history of Hodgkin lymphoma: Secondary | ICD-10-CM | POA: Insufficient documentation

## 2021-08-24 DIAGNOSIS — Z79899 Other long term (current) drug therapy: Secondary | ICD-10-CM | POA: Diagnosis not present

## 2021-08-24 LAB — CBC WITH DIFFERENTIAL (CANCER CENTER ONLY)
Abs Immature Granulocytes: 0.04 10*3/uL (ref 0.00–0.07)
Basophils Absolute: 0.1 10*3/uL (ref 0.0–0.1)
Basophils Relative: 1 %
Eosinophils Absolute: 0.2 10*3/uL (ref 0.0–0.5)
Eosinophils Relative: 4 %
HCT: 41.1 % (ref 39.0–52.0)
Hemoglobin: 13.6 g/dL (ref 13.0–17.0)
Immature Granulocytes: 1 %
Lymphocytes Relative: 15 %
Lymphs Abs: 0.8 10*3/uL (ref 0.7–4.0)
MCH: 32.5 pg (ref 26.0–34.0)
MCHC: 33.1 g/dL (ref 30.0–36.0)
MCV: 98.3 fL (ref 80.0–100.0)
Monocytes Absolute: 0.7 10*3/uL (ref 0.1–1.0)
Monocytes Relative: 13 %
Neutro Abs: 3.5 10*3/uL (ref 1.7–7.7)
Neutrophils Relative %: 66 %
Platelet Count: 246 10*3/uL (ref 150–400)
RBC: 4.18 MIL/uL — ABNORMAL LOW (ref 4.22–5.81)
RDW: 12.7 % (ref 11.5–15.5)
WBC Count: 5.2 10*3/uL (ref 4.0–10.5)
nRBC: 0 % (ref 0.0–0.2)

## 2021-08-24 LAB — CMP (CANCER CENTER ONLY)
ALT: <5 U/L (ref 0–44)
AST: 19 U/L (ref 15–41)
Albumin: 4.5 g/dL (ref 3.5–5.0)
Alkaline Phosphatase: 79 U/L (ref 38–126)
Anion gap: 7 (ref 5–15)
BUN: 27 mg/dL — ABNORMAL HIGH (ref 8–23)
CO2: 31 mmol/L (ref 22–32)
Calcium: 9.7 mg/dL (ref 8.9–10.3)
Chloride: 102 mmol/L (ref 98–111)
Creatinine: 1.17 mg/dL (ref 0.61–1.24)
GFR, Estimated: >60 mL/min (ref >60)
Glucose, Bld: 96 mg/dL (ref 70–99)
Potassium: 4.7 mmol/L (ref 3.5–5.1)
Sodium: 140 mmol/L (ref 135–145)
Total Bilirubin: 0.5 mg/dL (ref 0.3–1.2)
Total Protein: 6.8 g/dL (ref 6.5–8.1)

## 2021-08-24 LAB — LACTATE DEHYDROGENASE: LDH: 208 U/L — ABNORMAL HIGH (ref 98–192)

## 2021-08-24 MED ORDER — METHYLPREDNISOLONE 4 MG PO TBPK
ORAL_TABLET | ORAL | 2 refills | Status: DC
  Filled 2021-08-24: qty 21, 6d supply, fill #0

## 2021-08-24 MED ORDER — INFLUENZA VAC SPLIT QUAD 0.5 ML IM SUSY
PREFILLED_SYRINGE | INTRAMUSCULAR | 0 refills | Status: DC
  Filled 2021-08-24: qty 0.5, 1d supply, fill #0

## 2021-08-24 NOTE — Progress Notes (Signed)
Hematology and Oncology Follow Up Visit  Brian [name] [id number] 1961/05/03 60 y.o. 08/24/2021   Principle Diagnosis:  Recurrent Hodgkin's Disease -  S/p CAR-T therapy  TIA-resolved  Current Therapy:  Nivolumab q 3 month -- s/p cycle #18 -- on hold       Interim History:  [name] is in for follow-up.  He really is having a tough time right now.  His left leg really is bothering him.  It just sounds like he has some type of nerve impingement.  He is having a lot of pain in the left leg.  He  had back surgery earlier this year.  This was a micro discectomy at L4-5.  This really did not seem to help.  He is having more problems.  He is having a lot of pain.  This is nerve pain.  He is on Neurontin.  He is going for an EMG/NCS in a week or so.  I really think that he has a pinched nerve in his back.  I told him that a myelogram may not be a bad idea.  I realize that this is "old school" but this could certainly give some definition to anatomy.  He just cannot work out.  He has a hard time sleeping.  He has a hard time working.  I just hate that he is having this difficulty.  I just have a hard time believing this is anything related to his nivolumab.  I also have a hard time believing this is anything related to his CAR-T therapy.  This seems to be isolated to the left leg.  His quality of life is just not as good as I would like.  I called in some Medrol Dosepak for him.  Maybe this may help a little bit with any kind of inflammation.  Otherwise, he seems to be doing all right.  He has the Parkinson's.  He is on Sinemet for this.  We did do a PET scan on him back in June.  This did not show any evidence of lymphoma recurrence.  He has had no obvious problems with bowels or bladder.  He is eating okay.  His girlfriend came in.  She is incredibly nice and is such a good person for trying to help him and doing all she can to help his quality of life.  Overall, I would have to say  that his performance status for now is ECOG 2.    Medications:  Current Outpatient Medications:    amantadine (SYMMETREL) 100 MG capsule, Take 1 capsule (100 mg total) by mouth 2 (two) times daily., Disp: 180 capsule, Rfl: 1   aspirin EC 81 MG tablet, Take 81 mg by mouth daily. Swallow whole., Disp: , Rfl:    carbidopa-levodopa (SINEMET IR) 25-100 MG tablet, 2 tablets at 7 AM, 2 tablets at 11 AM, 1 tablet at 3 PM, 1 tablet at 7 PM (Patient taking differently: 2 tablets at 7 AM, 2 tablets at 11 AM, 1 tablet at 3 PM), Disp: 540 tablet, Rfl: 1   docusate sodium (COLACE) 100 MG capsule, Take by mouth., Disp: , Rfl:    gabapentin (NEURONTIN) 300 MG capsule, Take by mouth., Disp: , Rfl:    nivolumab (OPDIVO) 40 MG/4ML SOLN chemo injection, Inject into the vein., Disp: , Rfl:    olmesartan (BENICAR) 40 MG tablet, Take 40 mg by mouth daily., Disp: , Rfl:    olmesartan (BENICAR) 5 MG tablet, Take 10 mg by mouth  daily., Disp: , Rfl:    pramipexole (MIRAPEX) 1 MG tablet, Take 1 tablet (1 mg total) by mouth 3 (three) times daily., Disp: 270 tablet, Rfl: 2   Vitamin D, Ergocalciferol, (DRISDOL) 1.25 MG (50000 UNIT) CAPS capsule, Take 50,000 Units by mouth every 7 (seven) days., Disp: , Rfl:   Allergies: No Known Allergies  Past Medical History, Surgical history, Social history, and Family History were reviewed and updated.  Review of Systems: Review of Systems  Neurological:  Positive for tremors.  All other systems reviewed and are negative.   Physical Exam:  vitals were not taken for this visit.   Physical Exam Vitals reviewed.  HENT:     Head: Normocephalic and atraumatic.  Eyes:     Pupils: Pupils are equal, round, and reactive to light.  Cardiovascular:     Rate and Rhythm: Normal rate and regular rhythm.     Heart sounds: Normal heart sounds.  Pulmonary:     Effort: Pulmonary effort is normal.     Breath sounds: Normal breath sounds.  Abdominal:     General: Bowel sounds are normal.      Palpations: Abdomen is soft.  Musculoskeletal:        General: No tenderness or deformity. Normal range of motion.     Cervical back: Normal range of motion.  Lymphadenopathy:     Cervical: No cervical adenopathy.  Skin:    General: Skin is warm and dry.     Findings: No erythema or rash.  Neurological:     Mental Status: He is alert and oriented to person, place, and time.  Psychiatric:        Behavior: Behavior normal.        Thought Content: Thought content normal.        Judgment: Judgment normal.    Lab Results  Component Value Date   WBC 5.2 08/24/2021   HGB 13.6 08/24/2021   HCT 41.1 08/24/2021   MCV 98.3 08/24/2021   PLT 246 08/24/2021     Chemistry      Component Value Date/Time   NA 138 04/11/2021 1104   NA 142 04/02/2017 0000   NA 140 07/19/2016 1305   K 4.9 04/11/2021 1104   K 4.4 07/19/2016 1305   CL 104 04/11/2021 1104   CL 105 02/08/2016 1117   CL 107 03/25/2013 0828   CO2 28 04/11/2021 1104   CO2 26 07/19/2016 1305   BUN 42 (H) 04/11/2021 1104   BUN 21 04/02/2017 0000   BUN 22.8 07/19/2016 1305   CREATININE 1.15 04/11/2021 1104   CREATININE 1.1 07/19/2016 1305   GLU 105 04/02/2017 0000      Component Value Date/Time   CALCIUM 9.8 04/11/2021 1104   CALCIUM 9.8 07/19/2016 1305   ALKPHOS 88 04/11/2021 1104   ALKPHOS 76 07/19/2016 1305   AST 15 04/11/2021 1104   AST 18 07/19/2016 1305   ALT 3 04/11/2021 1104   ALT 14 07/19/2016 1305   BILITOT 0.4 04/11/2021 1104   BILITOT 0.70 07/19/2016 1305      Impression and Plan: [name] is a 60 year old gentleman.  He has history of recurrent Hodgkin's disease. 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 He then had a recurrence after this.  He did undergo CAR[facility]T therapy at [facility].  I believe he had this in April 2017.  He did well with this.  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  This was  proven by biopsy.  He had radiation therapy for this.  I will  repeat another PET scan on him.  I will do it in December.  We will hold on his immunotherapy.  We will have to see what the neurological evaluation is with respect to the EMG/NCV study.  This my give an idea as to where the problem might lie.  Again, I does want his quality of life to improve.  I think this will only improve if his left leg can get better.  I will plan to see him back after he has the PET scan in December.    [name], MD 9/29/20228:49 AM

## 2021-08-30 ENCOUNTER — Other Ambulatory Visit: Payer: Self-pay

## 2021-08-30 DIAGNOSIS — G20 Parkinson's disease: Secondary | ICD-10-CM

## 2021-08-30 MED ORDER — PRAMIPEXOLE DIHYDROCHLORIDE 1 MG PO TABS: 1.0000 mg | ORAL_TABLET | Freq: Three times a day (TID) | ORAL | 0 refills | Status: DC

## 2021-09-04 ENCOUNTER — Encounter: Payer: Self-pay | Admitting: Hematology & Oncology

## 2021-09-19 ENCOUNTER — Other Ambulatory Visit: Payer: Self-pay

## 2021-09-19 DIAGNOSIS — G20A1 Parkinson's disease without dyskinesia, without mention of fluctuations: Secondary | ICD-10-CM

## 2021-09-19 DIAGNOSIS — G20 Parkinson's disease: Secondary | ICD-10-CM

## 2021-09-19 DIAGNOSIS — G20B1 Parkinson's disease with dyskinesia, without mention of fluctuations: Secondary | ICD-10-CM

## 2021-09-19 MED ORDER — CARBIDOPA-LEVODOPA 25-100 MG PO TABS: ORAL_TABLET | ORAL | 0 refills | Status: DC

## 2021-09-23 ENCOUNTER — Encounter: Payer: Self-pay | Admitting: Hematology & Oncology

## 2021-10-01 ENCOUNTER — Encounter: Payer: Self-pay | Admitting: Family Medicine

## 2021-10-02 ENCOUNTER — Telehealth: Payer: Self-pay

## 2021-10-02 NOTE — Telephone Encounter (Signed)
Patient scheduled with Jodi Mourning, FNP on 10/03/2021.

## 2021-10-02 NOTE — Telephone Encounter (Signed)
Pt called in stating that he had a fall last week and has a bruised right arm that he may have hyperextended. Pt denies pain or swelling. Pt wanted to know if he should be seen by our office. Advised pt that he would need to follow up with his PCP which pt stated he already had an apt with his PCP's office for tomorrow, 10/03/21.

## 2021-10-03 ENCOUNTER — Encounter: Payer: Self-pay | Admitting: Family Medicine

## 2021-10-03 ENCOUNTER — Ambulatory Visit: Payer: Self-pay

## 2021-10-03 ENCOUNTER — Ambulatory Visit (INDEPENDENT_AMBULATORY_CARE_PROVIDER_SITE_OTHER): Payer: Commercial Managed Care - PPO | Admitting: Family Medicine

## 2021-10-03 ENCOUNTER — Other Ambulatory Visit: Payer: Self-pay

## 2021-10-03 ENCOUNTER — Ambulatory Visit (HOSPITAL_BASED_OUTPATIENT_CLINIC_OR_DEPARTMENT_OTHER)
Admission: RE | Admit: 2021-10-03 | Discharge: 2021-10-03 | Disposition: A | Discharge status: Home / Self Care | Payer: Commercial Managed Care - PPO | Source: Ambulatory Visit | Attending: Family Medicine | Admitting: Family Medicine

## 2021-10-03 ENCOUNTER — Ambulatory Visit (INDEPENDENT_AMBULATORY_CARE_PROVIDER_SITE_OTHER): Payer: Commercial Managed Care - PPO | Admitting: Family

## 2021-10-03 ENCOUNTER — Encounter: Payer: Self-pay | Admitting: Family

## 2021-10-03 VITALS — BP 150/98 | HR 96 | Temp 98.0°F | Ht 70.0 in | Wt 184.0 lb

## 2021-10-03 VITALS — BP 120/86 | Ht 70.0 in | Wt 184.0 lb

## 2021-10-03 DIAGNOSIS — M5416 Radiculopathy, lumbar region: Secondary | ICD-10-CM | POA: Insufficient documentation

## 2021-10-03 DIAGNOSIS — S42294A Other nondisplaced fracture of upper end of right humerus, initial encounter for closed fracture: Secondary | ICD-10-CM

## 2021-10-03 DIAGNOSIS — M25511 Pain in right shoulder: Secondary | ICD-10-CM

## 2021-10-03 DIAGNOSIS — S46011A Strain of muscle(s) and tendon(s) of the rotator cuff of right shoulder, initial encounter: Secondary | ICD-10-CM | POA: Insufficient documentation

## 2021-10-03 DIAGNOSIS — S43001A Unspecified subluxation of right shoulder joint, initial encounter: Secondary | ICD-10-CM

## 2021-10-03 DIAGNOSIS — S42201A Unspecified fracture of upper end of right humerus, initial encounter for closed fracture: Secondary | ICD-10-CM | POA: Insufficient documentation

## 2021-10-03 MED ORDER — METHYLPREDNISOLONE ACETATE 80 MG/ML IJ SUSP: 80.0000 mg | Freq: Once | INTRAMUSCULAR | Status: DC

## 2021-10-03 MED ORDER — METHYLPREDNISOLONE ACETATE 40 MG/ML IJ SUSP
40.0000 mg | Freq: Once | INTRAMUSCULAR | Status: AC
  Administered 2021-10-03: 40 mg via INTRAMUSCULAR

## 2021-10-03 NOTE — Assessment & Plan Note (Signed)
Initial injury on 11/1.  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 -Sling. -X-ray.

## 2021-10-03 NOTE — Patient Instructions (Signed)
Dr. Raeford Razor is going to see you today at 1:10; please arrive at his office at 1:00 pm. They are located in [place];

## 2021-10-03 NOTE — Assessment & Plan Note (Signed)
Initial injury on 11/1.  Unable to visualize the supraspinatus since his trauma.  Has lack of abduction and extension -Counseled on home exercise therapy and supportive care. -X-ray. -Sling. -May need to consider further imaging

## 2021-10-03 NOTE — Progress Notes (Signed)
Brian [name] is a 60 y.o. male with the following history as recorded in Epic[employer]:  Patient Active Problem List   Diagnosis Date Noted   Goals of care, counseling/discussion 09/25/2018   Mass of right chest wall 09/18/2018   PD (Parkinson's disease) ([place]) 11/27/2016   Lymphadenopathy 07/28/2015   Mediastinal adenopathy 07/14/2015   Aphasia 03/04/2015   Slurred speech 03/04/2015   Pleural mass 05/31/2014   Abdominal pain, epigastric 02/02/2014   Weight loss 08/12/2012   H/O Bell's palsy 08/12/2012   Hodgkin's disease 08/09/2011    Current Outpatient Medications  Medication Sig Dispense Refill   carbidopa-levodopa (SINEMET IR) 25-100 MG tablet 2 tablets at 7 AM, 2 tablets at 11 AM, 1 tablet at 3 PM, 1 tablet at 7 PM 540 tablet 0   cholecalciferol (VITAMIN D3) 25 MCG (1000 UNIT) tablet Take 1,000 Units by mouth daily.     nivolumab (OPDIVO) 40 MG/4ML SOLN chemo injection Inject into the vein.     olmesartan (BENICAR) 40 MG tablet Take 40 mg by mouth daily.     pramipexole (MIRAPEX) 1 MG tablet Take 1 tablet (1 mg total) by mouth 3 (three) times daily. 270 tablet 0   No current facility-administered medications for this visit.    Allergies: Patient has no known allergies.  Past Medical History:  Diagnosis Date   Anxiety    Dysrhythmia    past hx pvc   Goals of care, counseling/discussion 09/25/2018   H/O autologous stem cell transplant ([place])    may 2014  at [place]   History of Bell's palsy    2009  RIGHT SIDE--  HAS 80% FUNCTION / PT STATES A LITTLE ASYMETRICAL AND EFFECTS MOUTH   History of radiation therapy 10/18/17-11/05/17   sprine T1 26 Gy in 13 fractions, spine boost 10 Gy in 5 fractions   Hodgkin's disease, nodular sclerosis, of inguinal region/lower limb ([place]) ONOLOGIST--  DR [facility]NEVER AND A [facility]     SALVAGE CHEMO 2013/  AUTOLOGUS STEM CELL TRANSPLANT MAY 2014 AT [facility]   Mass of right chest wall 09/18/2018   Mass of right inguinal region    PVC (premature ventricular  contraction)    "benign"   TIA (transient ischemic attack) 02/2015   "probable TIA"   Wears contact lenses     Past Surgical History:  Procedure Laterality Date   AXILLARY LYMPH NODE BIOPSY Left 02/02/2013   Procedure: NEEDLE LOCALIZED AXILLARY LYMPH NODE BIOPSY;  Surgeon: Haywood Lasso, MD;  Location: [place];  Service: General;  Laterality: Left;   BACK SURGERY  03/2021   BONE MARROW BIOPSY  08/26/2012   CYST REMOVAL NECK Left 11/26/1978   LEFT INGUINAL LYMPH NODE BX  09/09/2012   LYMPH NODE BIOPSY N/A 07/28/2015   Procedure: LYMPH NODE BIOPSY;  Surgeon: Melrose Nakayama, MD;  Location: [place];  Service: Thoracic;  Laterality: N/A;   MASS EXCISION Right 09/19/2018   Procedure: EXCISION OF RIGHT CHEST WALL MASS ERAS PATHWAY;  Surgeon: Fanny Skates, MD;  Location: [place];  Service: General;  Laterality: Right;   NODE DISSECTION Right 07/28/2015   Procedure: NODE DISSECTION;  Surgeon: Melrose Nakayama, MD;  Location: [place];  Service: Thoracic;  Laterality: Right;   PLEURA BIOPSY Left 05/31/2014   REMOVAL RIGHT INGUINAL LYMPH NODES  08/16/2011   SCROTAL EXPLORATION Right 01/04/2014   Procedure: SCROTUM EXPLORATION   INGUINAL , EXCISION OF CYSTIC MASS OF RIGHT SPERMATIC CORD, [place];  Surgeon:  Franchot Gallo, MD;  Location: [facility];  Service: Urology;  Laterality: Right;   TRANSTHORACIC ECHOCARDIOGRAM  03/18/2013   MILD LVH/  EF 55-60%   VIDEO ASSISTED THORACOSCOPY Left 05/31/2014   Procedure: LEFT VIDEO ASSISTED THORACOSCOPY, PLEURAL BIOPSY;  Surgeon: Melrose Nakayama, MD;  Location: [place];  Service: Thoracic;  Laterality: Left;   VIDEO ASSISTED THORACOSCOPY Right 07/28/2015   Procedure: RIGHT VIDEO ASSISTED THORACOSCOPY;  Surgeon: Melrose Nakayama, MD;  Location: [place];  Service: Thoracic;  Laterality: Right;   VIDEO ASSISTED THORACOSCOPY (VATS)/ LYMPH NODE SAMPLING Right 07/28/2015   VIDEO  ASSISTED THORACOSCOPY (VATS)/WEDGE RESECTION  05/31/2014   VIDEO BRONCHOSCOPY WITH ENDOBRONCHIAL ULTRASOUND  07/28/2015   VIDEO BRONCHOSCOPY WITH ENDOBRONCHIAL ULTRASOUND N/A 07/28/2015   Procedure: VIDEO BRONCHOSCOPY WITH ENDOBRONCHIAL ULTRASOUND;  Surgeon: Melrose Nakayama, MD;  Location: [facility];  Service: Thoracic;  Laterality: N/A;    Family History  Problem Relation Age of Onset   Cancer Mother 48       ovarian   Ovarian cancer Mother 59   Heart disease Father    Stroke Father    Cancer Brother        testicular   Hypertension Brother    Healthy Son    Healthy Son    Hypertension Brother    Other Brother        CMT   Hypertension Brother     Social History   Tobacco Use   Smoking status: Never   Smokeless tobacco: Never  Substance Use Topics   Alcohol use: Yes    Alcohol/week: 14.0 standard drinks    Types: 7 Cans of beer, 7 Glasses of wine per week    Subjective:   Right injury/ arm pain secondary to fall on 11/1; noticed bruising occurring in the past few days; Braced arm in doorway trying to break the fall;  Limited range of motion due to pain;     Objective:  Vitals:   10/03/21 1121  BP: (!) 150/98  Pulse: 96  Temp: 98 F (36.7 C)  TempSrc: Oral  SpO2: 97%  Weight: 184 lb (83.5 kg)  Height: 5' 10" (1.778 m)    General: Well developed, well nourished, in no acute distress  Skin : Warm and dry. Healing bruise on right bicep Head: Normocephalic and atraumatic  Lungs: Respirations unlabored;  Neurologic: Alert and oriented; speech intact; face symmetrical; uses cane;  Assessment:  1. Acute pain of right shoulder     Plan:  Refer for sports medicine referral today; suspect he needs ultrasound and steroid injection;  This visit occurred during the SARS-CoV-2 public health emergency.  Safety protocols were in place, including screening questions prior to the visit, additional usage of staff PPE, and extensive cleaning of exam room while observing  appropriate contact time as indicated for disinfecting solutions.    No follow-ups on file.  No orders of the defined types were placed in this encounter.   Requested Prescriptions    No prescriptions requested or ordered in this encounter

## 2021-10-03 NOTE — Patient Instructions (Addendum)
Nice to meet you  Please try ice  Please try the sling  I will call with the results.  Please send me a message in MyChart with any questions or updates.  Follow up will depend on the results.   --Dr. Raeford Razor

## 2021-10-03 NOTE — Progress Notes (Signed)
Brian [name] - 60 y.o. male MRN [id number]  Date of birth: [birth year]  SUBJECTIVE:  Including CC & ROS.  No chief complaint on file.   Brian [name] is a 60 y.o. male that is  presenting with right shoulder injury. He was falling through a doorway and was trying to catch himself with his right shoulder. Now he is having a lack of full range of motion and swelling and ecchymosis of the proximal humerus.   Review of Systems See HPI   HISTORY: Past Medical, Surgical, Social, and Family History Reviewed & Updated per EMR.   Pertinent Historical Findings include:  Past Medical History:  Diagnosis Date   Anxiety    Dysrhythmia    past hx pvc   Goals of care, counseling/discussion 09/25/2018   H/O autologous stem cell transplant ([place])    may 2014  at [place]   History of Bell's palsy    2009  RIGHT SIDE--  HAS 80% FUNCTION / PT STATES A LITTLE ASYMETRICAL AND EFFECTS MOUTH   History of radiation therapy 10/18/17-11/05/17   sprine T1 26 Gy in 13 fractions, spine boost 10 Gy in 5 fractions   Hodgkin's disease, nodular sclerosis, of inguinal region/lower limb ([place]) ONOLOGIST--  DR [facility]NEVER AND [facility]     SALVAGE CHEMO 2013/  AUTOLOGUS STEM CELL TRANSPLANT MAY 2014 AT [facility]   Mass of right chest wall 09/18/2018   Mass of right inguinal region    PVC (premature ventricular contraction)    "benign"   TIA (transient ischemic attack) 02/2015   "probable TIA"   Wears contact lenses     Past Surgical History:  Procedure Laterality Date   AXILLARY LYMPH NODE BIOPSY Left 02/02/2013   Procedure: NEEDLE LOCALIZED AXILLARY LYMPH NODE BIOPSY;  Surgeon: Haywood Lasso, MD;  Location: [place];  Service: General;  Laterality: Left;   BACK SURGERY  03/2021   BONE MARROW BIOPSY  08/26/2012   CYST REMOVAL NECK Left 11/26/1978   LEFT INGUINAL LYMPH NODE BX  09/09/2012   LYMPH NODE BIOPSY N/A 07/28/2015   Procedure: LYMPH NODE BIOPSY;  Surgeon: Melrose Nakayama, MD;  Location: [place];  Service: Thoracic;  Laterality: N/A;   MASS EXCISION Right 09/19/2018   Procedure: EXCISION OF RIGHT CHEST WALL MASS ERAS PATHWAY;  Surgeon: Fanny Skates, MD;  Location: [place];  Service: General;  Laterality: Right;   NODE DISSECTION Right 07/28/2015   Procedure: NODE DISSECTION;  Surgeon: Melrose Nakayama, MD;  Location: [place];  Service: Thoracic;  Laterality: Right;   PLEURA BIOPSY Left 05/31/2014   REMOVAL RIGHT INGUINAL LYMPH NODES  08/16/2011   SCROTAL EXPLORATION Right 01/04/2014   Procedure: SCROTUM EXPLORATION   INGUINAL , EXCISION OF CYSTIC MASS OF RIGHT SPERMATIC CORD, [place];  Surgeon: Franchot Gallo, MD;  Location: [facility];  Service: Urology;  Laterality: Right;   TRANSTHORACIC ECHOCARDIOGRAM  03/18/2013   MILD LVH/  EF 55-60%   VIDEO ASSISTED THORACOSCOPY Left 05/31/2014   Procedure: LEFT VIDEO ASSISTED THORACOSCOPY, PLEURAL BIOPSY;  Surgeon: Melrose Nakayama, MD;  Location: [place];  Service: Thoracic;  Laterality: Left;   VIDEO ASSISTED THORACOSCOPY Right 07/28/2015   Procedure: RIGHT VIDEO ASSISTED THORACOSCOPY;  Surgeon: Melrose Nakayama, MD;  Location: [place];  Service: Thoracic;  Laterality: Right;   VIDEO ASSISTED THORACOSCOPY (VATS)/ LYMPH NODE SAMPLING Right 07/28/2015   VIDEO ASSISTED THORACOSCOPY (VATS)/WEDGE RESECTION  05/31/2014   VIDEO BRONCHOSCOPY WITH  ENDOBRONCHIAL ULTRASOUND  07/28/2015   VIDEO BRONCHOSCOPY WITH ENDOBRONCHIAL ULTRASOUND N/A 07/28/2015   Procedure: VIDEO BRONCHOSCOPY WITH ENDOBRONCHIAL ULTRASOUND;  Surgeon: Melrose Nakayama, MD;  [facility];  Service: Thoracic;  Laterality: N/A;    Family History  Problem Relation Age of Onset   Cancer Mother 46       ovarian   Ovarian cancer Mother 37   Heart disease Father    Stroke Father    Cancer Brother        testicular   Hypertension Brother    Healthy Son    Healthy Son    Hypertension Brother    Other Brother         CMT   Hypertension Brother     Social History   Socioeconomic History   Marital status: Divorced    Spouse name: Not on file   Number of children: 2   Years of education: Not on file   Highest education level: Bachelor's degree (e.g., BA, AB, BS)  Occupational History   Occupation: Oncologist: [employer]  Tobacco Use   Smoking status: Never   Smokeless tobacco: Never  Vaping Use   Vaping Use: Never used  Substance and Sexual Activity   Alcohol use: Yes    Alcohol/week: 14.0 standard drinks    Types: 7 Cans of beer, 7 Glasses of wine per week   Drug use: No   Sexual activity: Not Currently  Other Topics Concern   Not on file  Social History Narrative   DIVORCE. Education: [employer]. Exercise: jog/walk/ lift weights 7 days a week, 1-2 miles.   Right handed   2 children   Social Determinants of Health   Financial Resource Strain: Not on file  Food Insecurity: Not on file  Transportation Needs: Not on file  Physical Activity: Not on file  Stress: Not on file  Social Connections: Not on file  Intimate Partner Violence: Not on file     PHYSICAL EXAM:  VS: BP 120/86 (BP Location: Left Arm, Patient Position: Sitting)   Ht 5' 10"  (1.778 m)   Wt 184 lb (83.5 kg)   BMI 26.40 kg/m  Physical Exam Gen: NAD, alert, cooperative with exam, well-appearing   Limited ultrasound: Right Shoulder:  Encircling effusion of the biceps tendon sheath Overlying effusion of the subscapularis. Cortical changes of the proximal humerus to suggest an Hill-Sachs deformity. The supraspinatus is unable to be visualized to suggest a complete full-thickness retracted tear. Effusion noted in the posterior glenohumeral joint.  Summary: Findings indicate a proximal humerus fracture with rotator cuff tear  Ultrasound and interpretation by Clearance Coots, MD     ASSESSMENT & PLAN:   Lumbar radiculopathy Acute on chronic in nature.  Has a history of microdiscectomy and  now having ongoing radiculopathy. -Counseled on home exercise therapy and supportive care. -IM Depo-Medrol.  Closed fracture of right proximal humerus Initial injury on 11/1.  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 -Sling. -X-ray.   Traumatic complete tear of right rotator cuff Initial injury on 11/1.  Unable to visualize the supraspinatus since his trauma.  Has lack of abduction and extension -Counseled on home exercise therapy and supportive care. -X-ray. -Sling. -May need to consider further imaging

## 2021-10-03 NOTE — Assessment & Plan Note (Signed)
Acute on chronic in nature.  Has a history of microdiscectomy and now having ongoing radiculopathy. -Counseled on home exercise therapy and supportive care. -IM Depo-Medrol.

## 2021-10-04 ENCOUNTER — Telehealth: Payer: Self-pay | Admitting: Family Medicine

## 2021-10-04 DIAGNOSIS — S42294A Other nondisplaced fracture of upper end of right humerus, initial encounter for closed fracture: Secondary | ICD-10-CM

## 2021-10-04 DIAGNOSIS — S46011A Strain of muscle(s) and tendon(s) of the rotator cuff of right shoulder, initial encounter: Secondary | ICD-10-CM

## 2021-10-04 MED ORDER — HYDROCODONE-ACETAMINOPHEN 5-325 MG PO TABS: 1.0000 | ORAL_TABLET | Freq: Three times a day (TID) | ORAL | 0 refills | Status: DC | PRN

## 2021-10-04 NOTE — Telephone Encounter (Signed)
Patient called states he is unable to get MRI appt w/ [facility] till 11/27 & he is in great pain, doesn't want to wait that long--ask for MRI order to be sent elsewhere (Any Other Radiology Provider) that can get him in sooner than 11/27.  --Forwarding message to med asst .  -glh

## 2021-10-04 NOTE — Telephone Encounter (Signed)
Pt says he was seen by PT and has a known fracture and torn rotator cuff in the shoulder. Pt says Imaging could not get him scheduled for MRI until 3 weeks from now, he is a little frustrated since he also has Parkinson's. He is in a sling, and on a walker. He is scared of falling again in the next few weeks while waiting for the MRI.

## 2021-10-04 NOTE — Telephone Encounter (Signed)
Pt informed to keep trying [facility] for cancellations and if he feels he can no longer wait, he will let us know. We can place a new order for [facility]. Patient verbalized understanding.

## 2021-10-04 NOTE — Telephone Encounter (Signed)
Informed of results.  X-ray was demonstrating fracture with concern of full-thickness rotator cuff tear with traumatic origin.  Provided Norco for the pain and we will pursue MRI to evaluate for the rotator cuff tear.  Rosemarie Ax, MD [facility] 10/04/2021, 9:09 AM

## 2021-10-04 NOTE — Telephone Encounter (Signed)
Patient called for results of X-rays taken 11/8, advised him that radiologist hasn't release report yet & provider  or medical staff would contact him as soon as it has been released.  --Forwarding message to provider.  --glh

## 2021-10-04 NOTE — Telephone Encounter (Signed)
Pt.called and stated he would like to discuss the situation he is having I his arm. He didn't go into much detail and wanted to talk with Copland or her assistant personally to discuss

## 2021-10-04 NOTE — Telephone Encounter (Signed)
ok, please let him know I am sorry that his shoulder is injured It looks like MRI was ordered by Dr Raeford Razor and they recently changed the order from [facility] to the med center [facility] in hopes of getting it done more quickly-do we know if this option has already been investigated?    If this fails I can refer him to ortho for an evaluation- they have their own MRI machine and may be able to get this done faster.  Does he have an ortho he works with already?

## 2021-10-05 ENCOUNTER — Encounter: Payer: Self-pay | Admitting: Family Medicine

## 2021-10-05 NOTE — Telephone Encounter (Signed)
Pt aware and voices understanding.   

## 2021-10-08 ENCOUNTER — Encounter: Payer: Self-pay | Admitting: Family Medicine

## 2021-10-08 DIAGNOSIS — M25511 Pain in right shoulder: Secondary | ICD-10-CM

## 2021-10-09 NOTE — Addendum Note (Signed)
Addended by: Lamar Blinks C on: 10/09/2021 03:25 PM   Modules accepted: Orders

## 2021-10-09 NOTE — Telephone Encounter (Signed)
Patient would like his Ultrasound and X-ray of his right shoulder sent over to [facility] for his appointment  on 11/15. Their fax number is [phone]. Please advice.

## 2021-10-11 ENCOUNTER — Ambulatory Visit
Admission: RE | Admit: 2021-10-11 | Discharge: 2021-10-11 | Disposition: A | Discharge status: Home / Self Care | Payer: Commercial Managed Care - PPO | Source: Ambulatory Visit | Attending: Orthopaedic Surgery | Admitting: Orthopaedic Surgery

## 2021-10-11 ENCOUNTER — Other Ambulatory Visit: Payer: Self-pay | Admitting: Orthopaedic Surgery

## 2021-10-11 DIAGNOSIS — M25511 Pain in right shoulder: Secondary | ICD-10-CM

## 2021-10-11 DIAGNOSIS — S4290XA Fracture of unspecified shoulder girdle, part unspecified, initial encounter for closed fracture: Secondary | ICD-10-CM

## 2021-10-13 ENCOUNTER — Encounter
Payer: Commercial Managed Care - PPO | Attending: Physical Medicine and Rehabilitation | Admitting: Physical Medicine and Rehabilitation

## 2021-10-13 ENCOUNTER — Encounter: Payer: Self-pay | Admitting: Physical Medicine and Rehabilitation

## 2021-10-13 ENCOUNTER — Other Ambulatory Visit: Payer: Self-pay

## 2021-10-13 VITALS — BP 115/78 | HR 89 | Temp 97.9°F | Ht 70.0 in | Wt 181.0 lb

## 2021-10-13 DIAGNOSIS — M79652 Pain in left thigh: Secondary | ICD-10-CM | POA: Diagnosis present

## 2021-10-13 DIAGNOSIS — G6289 Other specified polyneuropathies: Secondary | ICD-10-CM | POA: Insufficient documentation

## 2021-10-13 DIAGNOSIS — S46011A Strain of muscle(s) and tendon(s) of the rotator cuff of right shoulder, initial encounter: Secondary | ICD-10-CM | POA: Insufficient documentation

## 2021-10-13 DIAGNOSIS — R350 Frequency of micturition: Secondary | ICD-10-CM | POA: Insufficient documentation

## 2021-10-13 DIAGNOSIS — N401 Enlarged prostate with lower urinary tract symptoms: Secondary | ICD-10-CM | POA: Diagnosis present

## 2021-10-13 DIAGNOSIS — S42294A Other nondisplaced fracture of upper end of right humerus, initial encounter for closed fracture: Secondary | ICD-10-CM | POA: Diagnosis present

## 2021-10-13 MED ORDER — TAMSULOSIN HCL 0.4 MG PO CAPS: 0.4000 mg | ORAL_CAPSULE | Freq: Every day | ORAL | 3 refills | Status: DC

## 2021-10-13 NOTE — Patient Instructions (Addendum)
Foods that may reduce pain: 1) Ginger (especially studied for arthritis)- reduce leukotriene production to decrease inflammation 2) Blueberries- high in phytonutrients that decrease inflammation 3) Salmon- marine omega-3s reduce joint swelling and pain 4) Pumpkin seeds- reduce inflammation 5) dark chocolate- reduces inflammation 6) turmeric- reduces inflammation 7) tart cherries - reduce pain and stiffness 8) extra virgin olive oil - its compound olecanthal helps to block prostaglandins  9) chili peppers- can be eaten or applied topically via capsaicin 10) mint- helpful for headache, muscle aches, joint pain, and itching 11) garlic- reduces inflammation  Link to further information on diet for chronic pain: http://[email or web address].randall.com/    [employer] Podcast #505 How I Cured Stage 3 Cancer in 4 Months  [place]

## 2021-10-13 NOTE — Addendum Note (Signed)
Addended by: Izora Ribas on: 10/13/2021 10:22 AM   Modules accepted: Orders

## 2021-10-13 NOTE — Progress Notes (Addendum)
Subjective:    Patient ID: Brian [name], male    DOB: [birth year], 60 y.o.   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 He has tried over the counter medications, prescription medications. The pain is horrible, searing, burning. It is present 24/7 and nothing happens. He has been on an immunotherapy medication for cancer. He thinks the pain is likely secondary to neuropathy. He had EMG/NCS showed radiculopathy. He has had stem cell transplant, radiation, chemotherapy and this is the worst. He does not want any more surgery. The pain is present in his left thigh anteriorly and behind his knee cap. Lidocaine does not help.   Pain Inventory Average Pain 8 Pain Right Now 4 My pain is constant, sharp, burning, dull, stabbing, tingling, and aching  In the last 24 hours, has pain interfered with the following? General activity 7 Relation with others 4 Enjoyment of life 10 What TIME of day is your pain at its worst? evening Sleep (in general) Poor  Pain is worse with: walking, sitting, inactivity, and standing Pain improves with: pacing activities, medication, and injections Relief from Meds:  none  walk without assistance how many minutes can you walk? 2 ability to climb steps?  yes do you drive?  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  yes  weakness tingling trouble walking spasms dizziness  Any changes since last visit?  no  Any changes since last visit?  no    Family History  Problem Relation Age of Onset   Cancer Mother 76       ovarian   Ovarian cancer Mother 49   Heart disease Father    Stroke Father    Cancer Brother        testicular   Hypertension Brother    Healthy Son    Healthy Son    Hypertension Brother     Other Brother        CMT   Hypertension Brother    Social History   Socioeconomic History   Marital status: Divorced    Spouse name: Not on file   Number of children: 2   Years of education: Not on file   Highest education level: Bachelor's degree (e.g., BA, AB, BS)  Occupational History   Occupation: Oncologist: [employer]  Tobacco Use   Smoking status: Never   Smokeless tobacco: Never  Vaping Use   Vaping Use: Never used  Substance and Sexual Activity   Alcohol use: Yes    Alcohol/week: 14.0 standard drinks    Types: 7 Cans of beer, 7 Glasses of wine per week   Drug use: No   Sexual activity: Not Currently  Other Topics Concern   Not on file  Social History Narrative   DIVORCE. Education: [employer]. Exercise: jog/walk/ lift weights 7 days a week, 1-2 miles.   Right handed   2 children   Social Determinants of Health   Financial Resource Strain: Not on file  Food Insecurity: Not on file  Transportation Needs: Not on file  Physical Activity: Not on file  Stress: Not on file  Social Connections: Not on file   Past Surgical History:  Procedure Laterality Date  AXILLARY LYMPH NODE BIOPSY Left 02/02/2013   Procedure: NEEDLE LOCALIZED AXILLARY LYMPH NODE BIOPSY;  Surgeon: Haywood Lasso, MD;  Location: [place];  Service: General;  Laterality: Left;   BACK SURGERY  03/2021   BONE MARROW BIOPSY  08/26/2012   CYST REMOVAL NECK Left 11/26/1978   LEFT INGUINAL LYMPH NODE BX  09/09/2012   LYMPH NODE BIOPSY N/A 07/28/2015   Procedure: LYMPH NODE BIOPSY;  Surgeon: Melrose Nakayama, MD;  Location: [place];  Service: Thoracic;  Laterality: N/A;   MASS EXCISION Right 09/19/2018   Procedure: EXCISION OF RIGHT CHEST WALL MASS ERAS PATHWAY;  Surgeon: Fanny Skates, MD;  Location: [place];  Service: General;  Laterality: Right;   NODE DISSECTION Right 07/28/2015   Procedure: NODE DISSECTION;  Surgeon: Melrose Nakayama, MD;  Location: [place];  Service: Thoracic;  Laterality: Right;   PLEURA BIOPSY Left 05/31/2014   REMOVAL RIGHT INGUINAL LYMPH NODES  08/16/2011   SCROTAL EXPLORATION Right 01/04/2014   Procedure: SCROTUM EXPLORATION   INGUINAL , EXCISION OF CYSTIC MASS OF RIGHT SPERMATIC CORD, [place];  Surgeon: Franchot Gallo, MD;  Location: [facility];  Service: Urology;  Laterality: Right;   TRANSTHORACIC ECHOCARDIOGRAM  03/18/2013   MILD LVH/  EF 55-60%   VIDEO ASSISTED THORACOSCOPY Left 05/31/2014   Procedure: LEFT VIDEO ASSISTED THORACOSCOPY, PLEURAL BIOPSY;  Surgeon: Melrose Nakayama, MD;  Location: [place];  Service: Thoracic;  Laterality: Left;   VIDEO ASSISTED THORACOSCOPY Right 07/28/2015   Procedure: RIGHT VIDEO ASSISTED THORACOSCOPY;  Surgeon: Melrose Nakayama, MD;  Location: [place];  Service: Thoracic;  Laterality: Right;   VIDEO ASSISTED THORACOSCOPY (VATS)/ LYMPH NODE SAMPLING Right 07/28/2015   VIDEO ASSISTED THORACOSCOPY (VATS)/WEDGE RESECTION  05/31/2014   VIDEO BRONCHOSCOPY WITH ENDOBRONCHIAL ULTRASOUND  07/28/2015   VIDEO BRONCHOSCOPY WITH ENDOBRONCHIAL ULTRASOUND N/A 07/28/2015   Procedure: VIDEO BRONCHOSCOPY WITH ENDOBRONCHIAL ULTRASOUND;  Surgeon: Melrose Nakayama, MD;  Location: [place];  Service: Thoracic;  Laterality: N/A;   Past Medical History:  Diagnosis Date   Anxiety    Dysrhythmia    past hx pvc   Goals of care, counseling/discussion 09/25/2018   H/O autologous stem cell transplant ([place])    may 2014  at [place]   History of Bell's palsy    2009  RIGHT SIDE--  HAS 80% FUNCTION / PT STATES A LITTLE ASYMETRICAL AND EFFECTS MOUTH   History of radiation therapy 10/18/17-11/05/17   sprine T1 26 Gy in 13 fractions, spine boost 10 Gy in 5 fractions   Hodgkin's disease, nodular sclerosis, of inguinal region/lower limb ([place]) ONOLOGIST--  DR Marin Olp AND A [facility]     SALVAGE CHEMO 2013/  AUTOLOGUS STEM CELL TRANSPLANT MAY 2014 AT [facility]    Mass of right chest wall 09/18/2018   Mass of right inguinal region    PVC (premature ventricular contraction)    "benign"   TIA (transient ischemic attack) 02/2015   "probable TIA"   Wears contact lenses    BP 115/78   Pulse 89   Temp 97.9 F (36.6 C)   Ht 5' 10"  (1.778 m)   Wt 181 lb (82.1 kg)   SpO2 98%   BMI 25.97 kg/m   Opioid Risk Score:   Fall Risk Score:  `1  Depression screen PHQ 2/9  Depression screen [facility] 2/9 10/13/2021 06/15/2020 12/09/2017 10/02/2017  Decreased Interest 1 0 0 0  Down, Depressed, Hopeless 1 1 0 0  PHQ -  2 Score 2 1 0 0  Altered sleeping 1 0 - -  Tired, decreased energy 0 1 - -  Change in appetite 0 1 - -  Feeling bad or failure about yourself  0 0 - -  Trouble concentrating 0 0 - -  Moving slowly or fidgety/restless 1 0 - -  Suicidal thoughts 0 0 - -  PHQ-9 Score 4 3 - -  Difficult doing work/chores - Not difficult at all - -  Some recent data might be hidden     Review of Systems  Genitourinary:  Positive for frequency.  Musculoskeletal:  Positive for gait problem.       Spasms  Neurological:  Positive for dizziness and numbness.       Tingling      Objective:   Physical Exam Gen: no distress, normal appearing HEENT: oral mucosa pink and moist, NCAT Cardio: Reg rate Chest: normal effort, normal rate of breathing Abd: soft, non-distended Ext: no edema Psych: pleasant, normal affect Skin: intact Neuro: Alert and oriented x3 Musculoskeletal: Right arm in sling.     Assessment & Plan:  1) Chronic Pain secondary to left anterior thigh neuropathy -Discussed current symptoms of pain and history of pain.  -Discussed benefits of exercise in reducing pain. MRI lumbar spine reviewed and shows: L2-L3: Grade 1 retrolisthesis. Disc bulge with endplate spurring/osteophyte ridge. Disc osteophyte ridge is more prominent within the right subarticular/foraminal zone. Facet arthrosis and ligamentum flavum hypertrophy. Moderate right  subarticular narrowing with posterior displacement of the descending right L3 nerve root. Mild left subarticular narrowing. Central canal patent. Mild right neural foraminal narrowing.   L3-L4: Disc bulge with endplate spurring/osteophyte ridge. Disc osteophyte ridge is more prominent within the left subarticular/foraminal zone. Moderate facet arthrosis with ligamentum flavum hypertrophy. Moderate left subarticular stenosis with posterior displacement of the descending left L4 nerve root. Mild right subarticular and central canal narrowing. Bilateral neural foraminal narrowing (mild right, moderate/severe left).   L4-L5: Interval left laminotomy and discectomy. 3.2 x 3.3 x 1.8 cm postoperative fluid collection at the laminotomy site (for instance as seen on series 6, image 23). This partly encroaches upon the left dorsal lateral aspect of the spinal canal. Disc bulge with endplate spurring. A previously demonstrated superimposed left center disc extrusion is no longer appreciated. Facet arthrosis/ligamentum flavum hypertrophy. The fluid collection at the left laminotomy site contributes to persistent moderate/severe left subarticular stenosis, with potential to affect the descending left L5 nerve root. Moderate right subarticular and moderate/severe central canal stenosis also present at this level. Bilateral neural foraminal narrowing (moderate right, severe left).   L5-S1: Small central disc protrusion. Mild facet arthrosis/ligamentum flavum hypertrophy. Minimal bilateral subarticular narrowing without frank nerve root impingement. Central canal patent. Mild/moderate right neural foraminal narrowing.   Impression #3 will be called to the ordering clinician or representative by the Radiologist Assistant, and communication documented in the PACS or [employer].   IMPRESSION: Comparison is made to the prior lumbar spine MRI of 10/23/2020.   At L4-L5, there has been interval  left laminotomy and discectomy. 3.2 x 3.3 x 1.8 cm postoperative fluid collection at the laminotomy site, partly encroaching upon the left dorsal lateral aspect of the spinal canal. This contributes to persistent multifactorial moderate/severe left subarticular stenosis with potential to affect the descending left L5 nerve root. Moderate right subarticular and moderate/severe central canal stenosis also present at this level. Bilateral neural foraminal narrowing (moderate right, severe left).   Also new from the prior MRI,  there is ventral displacement of the cauda equina nerve roots within the lumbar spinal canal, most notably at the L1 vertebral body level and L5-S1 level. Given these findings, a new dorsal subdural CSF intensity collection is suspected.   Lumbar spondylosis is otherwise unchanged. No more than mild central canal stenosis at the remaining levels. Additional sites of subarticular stenosis, as detailed. Most notably, moderate subarticular stenosis is present on the right at L2-L3, and on the left at L3-L4.   Additional sites of neural foraminal narrowing, as detailed and greatest on the left at L3-L4 (moderate/severe).   Persistent moderate degenerative endplate edema with enhancement at L2-L3, L3-L4 and L4-L5.   Persistent edema with enhancement within the bilateral L4 and L5 pedicles, which may be degenerative or may reflect stress reaction.  -Discussed following foods that may reduce pain: 1) Ginger (especially studied for arthritis)- reduce leukotriene production to decrease inflammation 2) Blueberries- high in phytonutrients that decrease inflammation 3) Salmon- marine omega-3s reduce joint swelling and pain 4) Pumpkin seeds- reduce inflammation 5) dark chocolate- reduces inflammation 6) turmeric- reduces inflammation 7) tart cherries - reduce pain and stiffness 8) extra virgin olive oil - its compound olecanthal helps to block prostaglandins  9) chili  peppers- can be eaten or applied topically via capsaicin 10) mint- helpful for headache, muscle aches, joint pain, and itching 11) garlic- reduces inflammation  Link to further information on diet for chronic pain: http://www.randall.com/   -Discussed Qutenza as an option for neuropathic pain control. Discussed that this is a capsaicin patch, stronger than capsaicin cream. Discussed that it is currently approved for diabetic peripheral neuropathy and post-herpetic neuralgia, but that it has also shown benefit in treating other forms of neuropathy. Provided patient with link to site to learn more about the patch: [employer]. Discussed that the patch would be placed in office and benefits usually last 3 months. Discussed that unintended exposure to capsaicin can cause severe irritation of eyes, mucous membranes, respiratory tract, and skin, but that Qutenza is a local treatment and does not have the systemic side effects of other nerve medications. Discussed that there may be pain, itching, erythema, and decreased sensory function associated with the application of Qutenza. Side effects usually subside within 1 week. A cold pack of analgesic medications can help with these side effects. Blood pressure can also be increased due to pain associated with administration of the patch.   2) Bilateral peripheral neuropathy -discussed that we can try Qutenza here as well, discussed that it can increase sensation with repeat applications.   3) BPH: Flomax 0.55m HS prescribed

## 2021-10-22 ENCOUNTER — Other Ambulatory Visit: Payer: Commercial Managed Care - PPO

## 2021-10-24 ENCOUNTER — Encounter: Payer: Self-pay | Admitting: Physical Medicine and Rehabilitation

## 2021-10-24 ENCOUNTER — Other Ambulatory Visit: Payer: Self-pay

## 2021-10-24 ENCOUNTER — Telehealth: Payer: Self-pay | Admitting: Physical Medicine and Rehabilitation

## 2021-10-24 ENCOUNTER — Encounter (HOSPITAL_BASED_OUTPATIENT_CLINIC_OR_DEPARTMENT_OTHER): Payer: Commercial Managed Care - PPO | Admitting: Physical Medicine and Rehabilitation

## 2021-10-24 VITALS — BP 130/82 | HR 91 | Temp 98.0°F | Ht 70.0 in | Wt 180.0 lb

## 2021-10-24 DIAGNOSIS — S42294A Other nondisplaced fracture of upper end of right humerus, initial encounter for closed fracture: Secondary | ICD-10-CM

## 2021-10-24 DIAGNOSIS — G6289 Other specified polyneuropathies: Secondary | ICD-10-CM

## 2021-10-24 DIAGNOSIS — N401 Enlarged prostate with lower urinary tract symptoms: Secondary | ICD-10-CM | POA: Diagnosis not present

## 2021-10-24 DIAGNOSIS — M79652 Pain in left thigh: Secondary | ICD-10-CM

## 2021-10-24 DIAGNOSIS — R350 Frequency of micturition: Secondary | ICD-10-CM

## 2021-10-24 DIAGNOSIS — S46011A Strain of muscle(s) and tendon(s) of the rotator cuff of right shoulder, initial encounter: Secondary | ICD-10-CM

## 2021-10-24 MED ORDER — LIDOCAINE 5 % EX PTCH: 1.0000 | MEDICATED_PATCH | CUTANEOUS | 0 refills | Status: DC

## 2021-10-24 NOTE — Telephone Encounter (Signed)
At check out patient states did not want to return for the qutenza patch states it burned and he didn't like it.  Was going to return to see Dr Aretta Nip for the injection.

## 2021-10-24 NOTE — Progress Notes (Signed)
Subjective:    Patient ID: Brian [name], male    DOB: August 06, 1961, 60 y.o.   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 He has tried over the counter medications, prescription medications. The pain is horrible, searing, burning. It is present 24/7 and nothing happens. He has been on an immunotherapy medication for cancer. He thinks the pain is likely secondary to neuropathy. He had EMG/NCS showed radiculopathy. He has had stem cell transplant, radiation, chemotherapy and this is the worst. He does not want any more surgery. The pain is present in his left thigh anteriorly and behind his knee cap. Lidocaine does not help- he uses 4% over the counter  He has not yet tried the Flomax for BPH  He asks whether he should get another epidural steroid injection  Also recently experienced a right sided humeral fracture with rotator cuff tear- has been feeling popping and grinding. Undergoing non-surgical management. Has been applying heat and ice with minimal benefit.   He prefers to take less medication given all that he has had to take as part of his cancer treatment.   He is ready to try Qutenza today  Pain Inventory Average Pain 8 Pain Right Now 4 My pain is constant, sharp, burning, dull, stabbing, tingling, and aching  In the last 24 hours, has pain interfered with the following? General activity 7 Relation with others 4 Enjoyment of life 10 What TIME of day is your pain at its worst? evening Sleep (in general) Poor  Pain is worse with: walking, sitting, inactivity, and standing Pain improves with: pacing activities, medication, and injections Relief from Meds:  none  walk without assistance how many minutes can you walk? 2 ability to climb steps?  yes do you drive?  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  yes  weakness tingling trouble walking spasms dizziness  Any changes since last visit?  no  Any changes since last visit?  no    Family History  Problem Relation Age of Onset   Cancer Mother 6       ovarian   Ovarian cancer Mother 29   Heart disease Father    Stroke Father    Cancer Brother        testicular   Hypertension Brother    Healthy Son    Healthy Son    Hypertension Brother    Other Brother        CMT   Hypertension Brother    Social History   Socioeconomic History   Marital status: Divorced    Spouse name: Not on file   Number of children: 2   Years of education: Not on file   Highest education level: Bachelor's degree (e.g., BA, AB, BS)  Occupational History   Occupation: Oncologist: [employer]  Tobacco Use   Smoking status: Never   Smokeless tobacco: Never  Vaping Use   Vaping Use: Never used  Substance and Sexual Activity   Alcohol use: Yes    Alcohol/week: 14.0 standard drinks    Types: 7 Cans of beer, 7 Glasses of wine per week   Drug use: No   Sexual activity: Not Currently  Other  Topics Concern   Not on file  Social History Narrative   DIVORCE. Education: [employer]. Exercise: jog/walk/ lift weights 7 days a week, 1-2 miles.   Right handed   2 children   Social Determinants of Health   Financial Resource Strain: Not on file  Food Insecurity: Not on file  Transportation Needs: Not on file  Physical Activity: Not on file  Stress: Not on file  Social Connections: Not on file   Past Surgical History:  Procedure Laterality Date   AXILLARY LYMPH NODE BIOPSY Left 02/02/2013   Procedure: NEEDLE LOCALIZED AXILLARY LYMPH NODE BIOPSY;  Surgeon: Haywood Lasso, MD;  Location: [place];  Service: General;  Laterality: Left;   BACK SURGERY  03/2021   BONE MARROW BIOPSY  08/26/2012   CYST REMOVAL NECK Left 11/26/1978    LEFT INGUINAL LYMPH NODE BX  09/09/2012   LYMPH NODE BIOPSY N/A 07/28/2015   Procedure: LYMPH NODE BIOPSY;  Surgeon: Melrose Nakayama, MD;  Location: [place];  Service: Thoracic;  Laterality: N/A;   MASS EXCISION Right 09/19/2018   Procedure: EXCISION OF RIGHT CHEST WALL MASS ERAS PATHWAY;  Surgeon: Fanny Skates, MD;  Location: [place];  Service: General;  Laterality: Right;   NODE DISSECTION Right 07/28/2015   Procedure: NODE DISSECTION;  Surgeon: Melrose Nakayama, MD;  Location: [place];  Service: Thoracic;  Laterality: Right;   PLEURA BIOPSY Left 05/31/2014   REMOVAL RIGHT INGUINAL LYMPH NODES  08/16/2011   SCROTAL EXPLORATION Right 01/04/2014   Procedure: SCROTUM EXPLORATION   INGUINAL , EXCISION OF CYSTIC MASS OF RIGHT SPERMATIC CORD, [place];  Surgeon: Franchot Gallo, MD;  Location[facility];  Service: Urology;  Laterality: Right;   TRANSTHORACIC ECHOCARDIOGRAM  03/18/2013   MILD LVH/  EF 55-60%   VIDEO ASSISTED THORACOSCOPY Left 05/31/2014   Procedure: LEFT VIDEO ASSISTED THORACOSCOPY, PLEURAL BIOPSY;  Surgeon: Melrose Nakayama, MD;  Location: [place];  Service: Thoracic;  Laterality: Left;   VIDEO ASSISTED THORACOSCOPY Right 07/28/2015   Procedure: RIGHT VIDEO ASSISTED THORACOSCOPY;  Surgeon: Melrose Nakayama, MD;  Location: [place];  Service: Thoracic;  Laterality: Right;   VIDEO ASSISTED THORACOSCOPY (VATS)/ LYMPH NODE SAMPLING Right 07/28/2015   VIDEO ASSISTED THORACOSCOPY (VATS)/WEDGE RESECTION  05/31/2014   VIDEO BRONCHOSCOPY WITH ENDOBRONCHIAL ULTRASOUND  07/28/2015   VIDEO BRONCHOSCOPY WITH ENDOBRONCHIAL ULTRASOUND N/A 07/28/2015   Procedure: VIDEO BRONCHOSCOPY WITH ENDOBRONCHIAL ULTRASOUND;  Surgeon: Melrose Nakayama, MD;  Location: [place];  Service: Thoracic;  Laterality: N/A;   Past Medical History:  Diagnosis Date   Anxiety    Dysrhythmia    past hx pvc   Goals of care, counseling/discussion 09/25/2018    H/O autologous stem cell transplant ([place])    may 2014  at [facility]   History of Bell's palsy    2009  RIGHT SIDE--  HAS 80% FUNCTION / PT STATES A LITTLE ASYMETRICAL AND EFFECTS MOUTH   History of radiation therapy 10/18/17-11/05/17   sprine T1 26 Gy in 13 fractions, spine boost 10 Gy in 5 fractions   Hodgkin's disease, nodular sclerosis, of inguinal region/lower limb ([place]) ONOLOGIST--  DR Marin Olp AND A [facility]     SALVAGE CHEMO 2013/  AUTOLOGUS STEM CELL TRANSPLANT MAY 2014 AT [facility]   Mass of right chest wall 09/18/2018   Mass of right inguinal region    PVC (premature ventricular contraction)    "benign"   TIA (transient ischemic attack) 02/2015   "  probable TIA"   Wears contact lenses    BP 130/82   Pulse 91   Temp 98 F (36.7 C) (Oral)   Ht 5' 10"  (1.778 m)   Wt 180 lb (81.6 kg)   SpO2 96%   BMI 25.83 kg/m   Opioid Risk Score:   Fall Risk Score:  `1  Depression screen PHQ 2/9  Depression screen [facility] 2/9 10/13/2021 06/15/2020 12/09/2017 10/02/2017  Decreased Interest 1 0 0 0  Down, Depressed, Hopeless 1 1 0 0  PHQ - 2 Score 2 1 0 0  Altered sleeping 1 0 - -  Tired, decreased energy 0 1 - -  Change in appetite 0 1 - -  Feeling bad or failure about yourself  0 0 - -  Trouble concentrating 0 0 - -  Moving slowly or fidgety/restless 1 0 - -  Suicidal thoughts 0 0 - -  PHQ-9 Score 4 3 - -  Difficult doing work/chores - Not difficult at all - -  Some recent data might be hidden     Review of Systems  Genitourinary:  Positive for frequency.  Musculoskeletal:  Positive for gait problem.       Spasms  Neurological:  Positive for dizziness and numbness.       Tingling      Objective:   Physical Exam Gen: no distress, normal appearing HEENT: oral mucosa pink and moist, NCAT Cardio: Reg rate Chest: normal effort, normal rate of breathing Abd: soft, non-distended Ext: no edema Psych: pleasant, normal affect Skin: intact Neuro: Alert and oriented x3 Musculoskeletal: Right  arm in sling.     Assessment & Plan:  1) Chronic Pain secondary to left anterior thigh neuropathy -Discussed current symptoms of pain and history of pain.  -Discussed benefits of exercise in reducing pain. MRI lumbar spine reviewed and shows: L2-L3: Grade 1 retrolisthesis. Disc bulge with endplate spurring/osteophyte ridge. Disc osteophyte ridge is more prominent within the right subarticular/foraminal zone. Facet arthrosis and ligamentum flavum hypertrophy. Moderate right subarticular narrowing with posterior displacement of the descending right L3 nerve root. Mild left subarticular narrowing. Central canal patent. Mild right neural foraminal narrowing.   L3-L4: Disc bulge with endplate spurring/osteophyte ridge. Disc osteophyte ridge is more prominent within the left subarticular/foraminal zone. Moderate facet arthrosis with ligamentum flavum hypertrophy. Moderate left subarticular stenosis with posterior displacement of the descending left L4 nerve root. Mild right subarticular and central canal narrowing. Bilateral neural foraminal narrowing (mild right, moderate/severe left).   L4-L5: Interval left laminotomy and discectomy. 3.2 x 3.3 x 1.8 cm postoperative fluid collection at the laminotomy site (for instance as seen on series 6, image 23). This partly encroaches upon the left dorsal lateral aspect of the spinal canal. Disc bulge with endplate spurring. A previously demonstrated superimposed left center disc extrusion is no longer appreciated. Facet arthrosis/ligamentum flavum hypertrophy. The fluid collection at the left laminotomy site contributes to persistent moderate/severe left subarticular stenosis, with potential to affect the descending left L5 nerve root. Moderate right subarticular and moderate/severe central canal stenosis also present at this level. Bilateral neural foraminal narrowing (moderate right, severe left).   L5-S1: Small central disc protrusion. Mild  facet arthrosis/ligamentum flavum hypertrophy. Minimal bilateral subarticular narrowing without frank nerve root impingement. Central canal patent. Mild/moderate right neural foraminal narrowing.   Impression #3 will be called to the ordering clinician or representative by the Radiologist Assistant, and communication documented in the PACS or [employer].   IMPRESSION: Comparison is made to the  prior lumbar spine MRI of 10/23/2020.   At L4-L5, there has been interval left laminotomy and discectomy. 3.2 x 3.3 x 1.8 cm postoperative fluid collection at the laminotomy site, partly encroaching upon the left dorsal lateral aspect of the spinal canal. This contributes to persistent multifactorial moderate/severe left subarticular stenosis with potential to affect the descending left L5 nerve root. Moderate right subarticular and moderate/severe central canal stenosis also present at this level. Bilateral neural foraminal narrowing (moderate right, severe left).   Also new from the prior MRI, there is ventral displacement of the cauda equina nerve roots within the lumbar spinal canal, most notably at the L1 vertebral body level and L5-S1 level. Given these findings, a new dorsal subdural CSF intensity collection is suspected.   Lumbar spondylosis is otherwise unchanged. No more than mild central canal stenosis at the remaining levels. Additional sites of subarticular stenosis, as detailed. Most notably, moderate subarticular stenosis is present on the right at L2-L3, and on the left at L3-L4.   Additional sites of neural foraminal narrowing, as detailed and greatest on the left at L3-L4 (moderate/severe).   Persistent moderate degenerative endplate edema with enhancement at L2-L3, L3-L4 and L4-L5.   Persistent edema with enhancement within the bilateral L4 and L5 pedicles, which may be degenerative or may reflect stress reaction.  -Discussed following foods that may reduce  pain: 1) Ginger (especially studied for arthritis)- reduce leukotriene production to decrease inflammation 2) Blueberries- high in phytonutrients that decrease inflammation 3) Salmon- marine omega-3s reduce joint swelling and pain 4) Pumpkin seeds- reduce inflammation 5) dark chocolate- reduces inflammation 6) turmeric- reduces inflammation 7) tart cherries - reduce pain and stiffness 8) extra virgin olive oil - its compound olecanthal helps to block prostaglandins  9) chili peppers- can be eaten or applied topically via capsaicin 10) mint- helpful for headache, muscle aches, joint pain, and itching 11) garlic- reduces inflammation  Link to further information on diet for chronic pain: http://www.randall.com/     2) Bilateral peripheral neuropathy -Discussed Qutenza as an option for neuropathic pain control. Discussed that this is a capsaicin patch, stronger than capsaicin cream. Discussed that it is currently approved for diabetic peripheral neuropathy and post-herpetic neuralgia, but that it has also shown benefit in treating other forms of neuropathy. Provided patient with link to site to learn more about the patch: [employer].fr. Discussed that the patch would be placed in office and benefits usually last 3 months. Discussed that unintended exposure to capsaicin can cause severe irritation of eyes, mucous membranes, respiratory tract, and skin, but that Qutenza is a local treatment and does not have the systemic side effects of other nerve medications. Discussed that there may be pain, itching, erythema, and decreased sensory function associated with the application of Qutenza. Side effects usually subside within 1 week. A cold pack of analgesic medications can help with these side effects. Blood pressure can also be increased due to pain associated with administration of the patch.  4 patches of Qutenza was applied  to the area of pain. Ice packs were offered during the procedure to ensure patient comfort. Blood pressure was monitored every 15 minutes. The patient tolerated the procedure well. Post-procedure instructions were given and follow-up has been scheduled.    3) BPH: continue Flomax 0.3m HS

## 2021-10-25 ENCOUNTER — Encounter: Payer: Self-pay | Admitting: Hematology & Oncology

## 2021-10-26 ENCOUNTER — Telehealth: Payer: Self-pay

## 2021-10-26 NOTE — Telephone Encounter (Signed)
PA submitted for Lidocaine 5% patches. 

## 2021-11-01 NOTE — Telephone Encounter (Signed)
Coverage of the requested medication is provided for postherpetic neuralgia, neuropathic pain, low back pain, and osteoarthritis. Other conditions for coverage may apply. Coverage cannot be authorized at this time. Appeal will be sent.

## 2021-11-02 NOTE — Telephone Encounter (Signed)
CaseId:[id number];Status:Approved;Review Type:Prior Auth;Coverage Start Date:10/03/2021;Coverage End Date:11/02/2022;

## 2021-11-02 NOTE — Telephone Encounter (Signed)
Approved 10/03/21-11/02/21

## 2021-11-03 ENCOUNTER — Ambulatory Visit
Admission: RE | Admit: 2021-11-03 | Discharge: 2021-11-03 | Disposition: A | Discharge status: Home / Self Care | Payer: Commercial Managed Care - PPO | Source: Ambulatory Visit

## 2021-11-03 ENCOUNTER — Other Ambulatory Visit: Payer: Self-pay | Admitting: Family Medicine

## 2021-11-03 ENCOUNTER — Other Ambulatory Visit: Payer: Self-pay

## 2021-11-03 ENCOUNTER — Ambulatory Visit (HOSPITAL_COMMUNITY)
Admission: RE | Admit: 2021-11-03 | Discharge: 2021-11-03 | Disposition: A | Discharge status: Home / Self Care | Payer: Commercial Managed Care - PPO | Source: Ambulatory Visit | Attending: Hematology & Oncology | Admitting: Hematology & Oncology

## 2021-11-03 DIAGNOSIS — C8195 Hodgkin lymphoma, unspecified, lymph nodes of inguinal region and lower limb: Secondary | ICD-10-CM | POA: Insufficient documentation

## 2021-11-03 DIAGNOSIS — M5417 Radiculopathy, lumbosacral region: Secondary | ICD-10-CM

## 2021-11-03 LAB — GLUCOSE, CAPILLARY: Glucose-Capillary: 103 mg/dL — ABNORMAL HIGH (ref 70–99)

## 2021-11-03 MED ORDER — FLUDEOXYGLUCOSE F - 18 (FDG) INJECTION
9.0000 | Freq: Once | INTRAVENOUS | Status: AC | PRN
  Administered 2021-11-03: 8.8 via INTRAVENOUS

## 2021-11-06 ENCOUNTER — Encounter: Payer: Self-pay | Admitting: Hematology & Oncology

## 2021-11-08 ENCOUNTER — Other Ambulatory Visit: Payer: Self-pay | Admitting: Hematology & Oncology

## 2021-11-08 ENCOUNTER — Telehealth: Payer: Self-pay | Admitting: Family Medicine

## 2021-11-08 NOTE — Telephone Encounter (Signed)
Pt. Submitted paperwork to be completed for employer. Paperwork is in providers box. Company does not allow personal information to be faxed so it can be emailed to:  [email or web address] [phone]

## 2021-11-08 NOTE — Telephone Encounter (Signed)
Form place in folder for provider to review

## 2021-11-09 ENCOUNTER — Encounter: Payer: Self-pay | Admitting: Family Medicine

## 2021-11-09 ENCOUNTER — Inpatient Hospital Stay: Payer: Commercial Managed Care - PPO | Attending: Hematology & Oncology

## 2021-11-09 ENCOUNTER — Ambulatory Visit: Payer: Commercial Managed Care - PPO | Admitting: Hematology & Oncology

## 2021-11-09 ENCOUNTER — Ambulatory Visit: Payer: Commercial Managed Care - PPO | Admitting: Physical Medicine and Rehabilitation

## 2021-11-09 ENCOUNTER — Encounter: Payer: Self-pay | Admitting: Hematology & Oncology

## 2021-11-09 ENCOUNTER — Other Ambulatory Visit: Payer: Self-pay

## 2021-11-09 ENCOUNTER — Telehealth: Payer: Self-pay | Admitting: Neurology

## 2021-11-09 ENCOUNTER — Inpatient Hospital Stay (HOSPITAL_BASED_OUTPATIENT_CLINIC_OR_DEPARTMENT_OTHER): Payer: Commercial Managed Care - PPO | Admitting: Hematology & Oncology

## 2021-11-09 ENCOUNTER — Other Ambulatory Visit: Payer: Commercial Managed Care - PPO

## 2021-11-09 VITALS — BP 129/74 | HR 93 | Temp 98.3°F | Resp 18 | Wt 179.0 lb

## 2021-11-09 DIAGNOSIS — Z79899 Other long term (current) drug therapy: Secondary | ICD-10-CM | POA: Insufficient documentation

## 2021-11-09 DIAGNOSIS — C8195 Hodgkin lymphoma, unspecified, lymph nodes of inguinal region and lower limb: Secondary | ICD-10-CM | POA: Diagnosis not present

## 2021-11-09 DIAGNOSIS — Z923 Personal history of irradiation: Secondary | ICD-10-CM | POA: Insufficient documentation

## 2021-11-09 DIAGNOSIS — R443 Hallucinations, unspecified: Secondary | ICD-10-CM | POA: Diagnosis not present

## 2021-11-09 DIAGNOSIS — G20 Parkinson's disease: Secondary | ICD-10-CM | POA: Diagnosis not present

## 2021-11-09 DIAGNOSIS — Z8571 Personal history of Hodgkin lymphoma: Secondary | ICD-10-CM | POA: Insufficient documentation

## 2021-11-09 DIAGNOSIS — Z9484 Stem cells transplant status: Secondary | ICD-10-CM | POA: Insufficient documentation

## 2021-11-09 LAB — CMP (CANCER CENTER ONLY)
ALT: <5 U/L (ref 0–44)
AST: 18 U/L (ref 15–41)
Albumin: 4.3 g/dL (ref 3.5–5.0)
Alkaline Phosphatase: 81 U/L (ref 38–126)
Anion gap: 8 (ref 5–15)
BUN: 41 mg/dL — ABNORMAL HIGH (ref 8–23)
CO2: 28 mmol/L (ref 22–32)
Calcium: 9.6 mg/dL (ref 8.9–10.3)
Chloride: 106 mmol/L (ref 98–111)
Creatinine: 1.21 mg/dL (ref 0.61–1.24)
GFR, Estimated: >60 mL/min (ref >60)
Glucose, Bld: 94 mg/dL (ref 70–99)
Potassium: 4.1 mmol/L (ref 3.5–5.1)
Sodium: 142 mmol/L (ref 135–145)
Total Bilirubin: 0.5 mg/dL (ref 0.3–1.2)
Total Protein: 6.5 g/dL (ref 6.5–8.1)

## 2021-11-09 LAB — CBC WITH DIFFERENTIAL (CANCER CENTER ONLY)
Abs Immature Granulocytes: 0.05 10*3/uL (ref 0.00–0.07)
Basophils Absolute: 0.1 10*3/uL (ref 0.0–0.1)
Basophils Relative: 1 %
Eosinophils Absolute: 0.2 10*3/uL (ref 0.0–0.5)
Eosinophils Relative: 2 %
HCT: 38.9 % — ABNORMAL LOW (ref 39.0–52.0)
Hemoglobin: 12.4 g/dL — ABNORMAL LOW (ref 13.0–17.0)
Immature Granulocytes: 1 %
Lymphocytes Relative: 13 %
Lymphs Abs: 0.9 10*3/uL (ref 0.7–4.0)
MCH: 31.4 pg (ref 26.0–34.0)
MCHC: 31.9 g/dL (ref 30.0–36.0)
MCV: 98.5 fL (ref 80.0–100.0)
Monocytes Absolute: 0.7 10*3/uL (ref 0.1–1.0)
Monocytes Relative: 11 %
Neutro Abs: 4.8 10*3/uL (ref 1.7–7.7)
Neutrophils Relative %: 72 %
Platelet Count: 255 10*3/uL (ref 150–400)
RBC: 3.95 MIL/uL — ABNORMAL LOW (ref 4.22–5.81)
RDW: 12.7 % (ref 11.5–15.5)
WBC Count: 6.7 10*3/uL (ref 4.0–10.5)
nRBC: 0 % (ref 0.0–0.2)

## 2021-11-09 LAB — LACTATE DEHYDROGENASE: LDH: 209 U/L — ABNORMAL HIGH (ref 98–192)

## 2021-11-09 NOTE — Telephone Encounter (Signed)
Let pt know that Dr. Marin Olp contacted me re: a fall with humerus fx and hallucination.  Curious if patient fell b/c of Parkinsons Disease or the issues with his back.  Also is he on pain meds now because those can cause hallucinations.  Find out what the hallucinations are/how often, etc.  We may need to taper his pramipexole.

## 2021-11-09 NOTE — Progress Notes (Signed)
Hematology and Oncology Follow Up Visit  Brian [name] [id number] 1961/05/22 60 y.o. 11/09/2021   Principle Diagnosis:  Recurrent Hodgkin's Disease -  S/p CAR-T therapy  TIA-resolved  Current Therapy:  Nivolumab q 3 month -- s/p cycle #18 -- on hold       Interim History:  [name] is in for follow-up.  Again, his problem continues to be as left leg.  Unfortunately now, he is fallen.  He apparently broke the humerus in the right arm.  This was a few weeks ago.  Is having some hallucinations.  I am sure this is from his Parkinson's.  I am just sad that he is having a difficult time.  He is grossly I surgeon at [facility] to try to help with his left thigh.  This seems to be coming from his past surgery that he had.,  Sure if anything can be done outside of another surgical intervention.  We did do a PET scan on him.  The PET scan did not show any evidence of obvious lymphoma recurrence.  He did not have activity in the right shoulder where he had the fracture.  I think he does see an orthopedic surgeon for the shoulder.  He is really not able to work because of the pain.  He has a tough time getting around.  He is not able to exercise anymore.  He has had no fever.  There has been no problems with cough or shortness of breath.  He has had no change in bowel bladder habits.  There has been no rashes.  He has had no leg swelling.  Overall, I would have to say his performance status is probably ECOG 2.   Medications:  Current Outpatient Medications:    acetaminophen (TYLENOL) 500 MG tablet, Take by mouth., Disp: , Rfl:    carbidopa-levodopa (SINEMET IR) 25-100 MG tablet, 2 tablets at 7 AM, 2 tablets at 11 AM, 1 tablet at 3 PM, 1 tablet at 7 PM, Disp: 540 tablet, Rfl: 0   cholecalciferol (VITAMIN D3) 25 MCG (1000 UNIT) tablet, Take 1,000 Units by mouth daily., Disp: , Rfl:    HYDROcodone-acetaminophen (NORCO/VICODIN) 5-325 MG tablet, Take 1 tablet by mouth every 8 (eight) hours as  needed., Disp: 15 tablet, Rfl: 0   ibuprofen (ADVIL) 200 MG tablet, Take by mouth., Disp: , Rfl:    lidocaine (LIDODERM) 5 %, Place 1 patch onto the skin daily. Remove & Discard patch within 12 hours or as directed by MD, Disp: 30 patch, Rfl: 0   olmesartan (BENICAR) 40 MG tablet, TAKE 1 TABLET(40 MG) BY MOUTH DAILY, Disp: 30 tablet, Rfl: 3   oxyCODONE-acetaminophen (PERCOCET) 10-325 MG tablet, take 1 tablet by oral route  every 6 hours as needed for moderate or severe pain, Disp: , Rfl:    pramipexole (MIRAPEX) 1 MG tablet, Take 1 tablet (1 mg total) by mouth 3 (three) times daily., Disp: 270 tablet, Rfl: 0   tamsulosin (FLOMAX) 0.4 MG CAPS capsule, Take 1 capsule (0.4 mg total) by mouth daily after supper., Disp: 30 capsule, Rfl: 3  Allergies: No Known Allergies  Past Medical History, Surgical history, Social history, and Family History were reviewed and updated.  Review of Systems: Review of Systems  Neurological:  Positive for tremors.  All other systems reviewed and are negative.   Physical Exam:  vitals were not taken for this visit.   Physical Exam Vitals reviewed.  HENT:     Head: Normocephalic and atraumatic.  Eyes:     Pupils: Pupils are equal, round, and reactive to light.  Cardiovascular:     Rate and Rhythm: Normal rate and regular rhythm.     Heart sounds: Normal heart sounds.  Pulmonary:     Effort: Pulmonary effort is normal.     Breath sounds: Normal breath sounds.  Abdominal:     General: Bowel sounds are normal.     Palpations: Abdomen is soft.  Musculoskeletal:        General: No tenderness or deformity. Normal range of motion.     Cervical back: Normal range of motion.  Lymphadenopathy:     Cervical: No cervical adenopathy.  Skin:    General: Skin is warm and dry.     Findings: No erythema or rash.  Neurological:     Mental Status: He is alert and oriented to person, place, and time.  Psychiatric:        Behavior: Behavior normal.        Thought  Content: Thought content normal.        Judgment: Judgment normal.    Lab Results  Component Value Date   WBC 6.7 11/09/2021   HGB 12.4 (L) 11/09/2021   HCT 38.9 (L) 11/09/2021   MCV 98.5 11/09/2021   PLT 255 11/09/2021     Chemistry      Component Value Date/Time   NA 140 08/24/2021 0831   NA 142 04/02/2017 0000   NA 140 07/19/2016 1305   K 4.7 08/24/2021 0831   K 4.4 07/19/2016 1305   CL 102 08/24/2021 0831   CL 105 02/08/2016 1117   CL 107 03/25/2013 0828   CO2 31 08/24/2021 0831   CO2 26 07/19/2016 1305   BUN 27 (H) 08/24/2021 0831   BUN 21 04/02/2017 0000   BUN 22.8 07/19/2016 1305   CREATININE 1.17 08/24/2021 0831   CREATININE 1.1 07/19/2016 1305   GLU 105 04/02/2017 0000      Component Value Date/Time   CALCIUM 9.7 08/24/2021 0831   CALCIUM 9.8 07/19/2016 1305   ALKPHOS 79 08/24/2021 0831   ALKPHOS 76 07/19/2016 1305   AST 19 08/24/2021 0831   AST 18 07/19/2016 1305   ALT <5 08/24/2021 0831   ALT 14 07/19/2016 1305   BILITOT 0.5 08/24/2021 0831   BILITOT 0.70 07/19/2016 1305      Impression and Plan: Brian [name] is a 60 year old gentleman.  He has history of recurrent Hodgkin's disease. 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 He then had a recurrence after this.  He did undergo CAR-T therapy at [facility].  I believe he had this in April 2017.  He did well with this.  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  This was proven by biopsy.  He had radiation therapy for this.  He is off the [place] now.  Hopefully we can keep him off Opdivo.  I am glad that the PET scan did not show any obvious lymphoma.  Again the activity in the right shoulder seems to be where he fell.  I do not think we have to do another PET scan on him probably for another 6 months.  Hopefully, he will be able to get some relief from the left thigh.  I just feel bad that he is having such a hard time with this.   Brian Napoleon,  MD 12/15/202212:42 PM

## 2021-11-10 ENCOUNTER — Telehealth: Payer: Self-pay | Admitting: Hematology & Oncology

## 2021-11-10 NOTE — Telephone Encounter (Signed)
Called patient  he is going through PT for rotator cuff injury from his fall. Patient would like to wait until appt to adjust any medications he said hallucinations are happening when he is awake and lucid he said he doesn't like taking pain medication and finds the hallucinations almost funny not scary at this time . Going through alot of testing and issues with back neuropathy and frozen feet. Patient is trying to have a positive outlook on the situation but really in a position right now to get answers about other issues before determining if they are Parkinson's related. I relayed to patient to call with ant issues or updates and let us know if he needs anything

## 2021-11-10 NOTE — Telephone Encounter (Signed)
Attempted to contact patient to advise of appts that were scheduled per los 12/15, voicemail was left providing appt date and time, 02/07/22. Patient is to call back to either confirm appts or reschedule if times don't work for him.

## 2021-11-10 NOTE — Telephone Encounter (Signed)
Form for completion.

## 2021-11-13 ENCOUNTER — Other Ambulatory Visit: Payer: Self-pay | Admitting: Adult Health Nurse Practitioner

## 2021-11-13 ENCOUNTER — Ambulatory Visit
Admission: RE | Admit: 2021-11-13 | Discharge: 2021-11-13 | Disposition: A | Discharge status: Home / Self Care | Payer: Commercial Managed Care - PPO | Source: Ambulatory Visit | Attending: Adult Health Nurse Practitioner | Admitting: Adult Health Nurse Practitioner

## 2021-11-13 DIAGNOSIS — M5416 Radiculopathy, lumbar region: Secondary | ICD-10-CM

## 2021-11-13 NOTE — Telephone Encounter (Signed)
Pt called to go over his time out of work. His start date would be 12/1 and is checking with hr to see when his end date will be, but he thinks it will be 1/9.

## 2021-11-21 ENCOUNTER — Telehealth: Payer: Self-pay | Admitting: Neurology

## 2021-11-21 ENCOUNTER — Ambulatory Visit: Payer: Commercial Managed Care - PPO | Admitting: Physical Medicine and Rehabilitation

## 2021-11-21 NOTE — Telephone Encounter (Signed)
Called patients girlfriend back with recommendation from Dr. Delice Lesch. Patient is no longer on any pain medication other than OTC anti inflammatory meds like aleve. Patient is going to reach out to [facility] as well about he neuropathy. Patient did miss his infusion in June the Oncology Doctor approved him missing this infusion but girlfriend feels that a lot of his symptoms occurred after missing this infusion treatment . Patients girlfriend is going to try the adjustment on the pramipexole

## 2021-11-21 NOTE — Telephone Encounter (Signed)
Called patients girlfriend back she is on [place]. She is seeing different things in the patient than what I had discussed with the patient on 11-09-21. Patient is really having more severe Hallucinations were he is not able to recognize that hey are hallucinations. Patient really struggling at night to sleep. He has met with [facility] about his Neuropathy but patients girlfriend said she feels his mobility has become a lot worse with frozen feet an inability to walk. Patients girlfriend hoping to get on the wait list or see Dr sooner

## 2021-11-21 NOTE — Telephone Encounter (Signed)
Pt girlfriend is concerned about [place]. The past week or two his symptoms have amplified and she feels he needs to be seen sooner than his  12/20/21 appt.  Symptoms are freezing and hallucinations. They're concerned about his safety. Can call GF phone or his. She would like a call back today if possible

## 2021-11-21 NOTE — Telephone Encounter (Signed)
Pls let them know that Dr. Carles Collet is out of the office but on her last note indicated that pain medication may be contributing to hallucination, is he still taking any pain medication? If not, she noted that Pramipexole may need to be reduced. He is listed as taking Pramipexole 1mg  TID. Pls have them reduce to 1/2 tab in AM, 1 tab at noon, 1 tab at bedtime. Thanks

## 2021-11-29 NOTE — Telephone Encounter (Signed)
Pt called and stated that Shelia no longer works at location and they were unable to get paperwork. Wanted to see if paperwork could be faxed to: Carlyle Basques:  Fax: [phone] Phone: [phone]

## 2021-11-30 NOTE — Progress Notes (Signed)
Assessment/Plan:   1.  Parkinsons Disease  - increase carbidopa/levodopa 25/100 and take 2 tablets at 7 AM, 2 tablets at 11 AM, 2 tablet at 3 PM, 2 tablet at 7 PM.             -Restart carbidopa/levodopa 50/200 at bedtime.             -Decrease pramipexole from 1 mg 3 times per day to 0.5 mg 3 times per day due to hallucinations.  These are primarily at night.  -he doesn't want MRI brain.  Told him we will do this if above changes don't help hallucination.  -We will check lab work including UA, CBC, chemistry  -discussed PT but he does not want to do that while dealing with his back.  He is scheduled to have an epidural steroid injection this week.    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  Patient has symptomatic reinnervation of the right face.   3.  Hodgkin's lymphoma             -Doing well from that standpoint.  Following with oncology.   4.  Renal insufficiency             -monitoring.  Mild.     5.  Lumbar radiculopathy             -Patient had microdiscectomy Mar 29, 2021  -Microdiscectomy complicated by subdural CSF fluid collection at L1 vertebral body and L5-S1 level.  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  -Patient seen by [facility] neurology and [facility].  [facility] neurosurgery just recommended L5-S1 nerve injection. Subjective:   Brian [name] was seen today in follow up for Parkinsons disease.  My previous records were reviewed prior to todays visit as well as outside records available to me.  Pt with girlfriend who supplements the hx.   I received correspondence from his oncologist recently about hallucinations.  I called the patient and he did not want me to do anything about it until he finished his work-up with [facility] neurology, and stated that he would see me and discussed at this appointment.  Only a few days  later, his girlfriend called me to complain about hallucinations (she is not on the record release for my office).  Today, he states he will see bugs surrounding his phone but he knows it isn't real.  He will awaken in the middle of the night and see "strange little dudes."  That doesn't happen in the day.  His pramipexole was just slightly decreased last visit.  He hasn't had hallucinations in the last few days.  Noting some trouble with freezing when first gets up.  Girlfriend most concerned about this.  Esp at night and first in the AM.  He has not been exercising but did try to get back to that yesterday.  He can get on the stationary bike.  Pt up multiple times to use the restroom.  Tried flomax without relief.  Waking up in middle of the night for the BR and because the legs fall off of the bed.  Feels that legs are freezing.  As above, the patient has been following with both [facility] neurology and [facility]surgery.  [facility] neurolog[facility] identified nothing more  than a mild L5-S1 radiculopathy on the left.  No evidence of large fiber peripheral neuropathy.  He followed back up with neurosurgery who recommended left L5-S1 nerve injections.    Current prescribed movement disorder medications: carbidopa/levodopa 25/100 and take 2 tablets at 7 AM, 2 tablets at 11 AM, 1 tablet at 3 PM, 1 tablet at 7 PM (increased) carbidopa/levodopa 50/200 CR q hs (pt stopped that) Pramipexole 1 mg tid (last week he went to half tablet in the AM, 1 in the afternoon, 1 in the evening)   PREVIOUS MEDICATIONS: carbidopa/levodopa 50/200 CR (d/c just because tried it for pain and still waking up with pain in back so d/c it); amantadine (he didn't want to take it and he stopped it - didn't think that dyskinesia was an issue)  ALLERGIES:  No Known Allergies  CURRENT MEDICATIONS:  Outpatient Encounter Medications as of 12/04/2021  Medication Sig   acetaminophen (TYLENOL) 500 MG tablet Take by mouth.   carbidopa-levodopa (SINEMET  IR) 25-100 MG tablet 2 tablets at 7 AM, 2 tablets at 11 AM, 1 tablet at 3 PM, 1 tablet at 7 PM   cholecalciferol (VITAMIN D3) 25 MCG (1000 UNIT) tablet Take 1,000 Units by mouth daily.   ibuprofen (ADVIL) 200 MG tablet Take by mouth.   lidocaine (LIDODERM) 5 % Place 1 patch onto the skin daily. Remove & Discard patch within 12 hours or as directed by MD   olmesartan (BENICAR) 40 MG tablet TAKE 1 TABLET(40 MG) BY MOUTH DAILY   oxyCODONE-acetaminophen (PERCOCET) 10-325 MG tablet take 1 tablet by oral route  every 6 hours as needed for moderate or severe pain   pramipexole (MIRAPEX) 1 MG tablet Take 1 tablet (1 mg total) by mouth 3 (three) times daily.   tamsulosin (FLOMAX) 0.4 MG CAPS capsule Take 1 capsule (0.4 mg total) by mouth daily after supper.   HYDROcodone-acetaminophen (NORCO/VICODIN) 5-325 MG tablet Take 1 tablet by mouth every 8 (eight) hours as needed.   No facility-administered encounter medications on file as of 12/04/2021.    Objective:   PHYSICAL EXAMINATION:    VITALS:   Vitals:   12/04/21 0947  BP: 131/86  Pulse: 89  SpO2: 99%  Weight: 177 lb (80.3 kg)  Height: 5\' 10"  (1.778 m)     GEN:  The patient appears stated age and is in NAD. HEENT:  Normocephalic, atraumatic.  The mucous membranes are moist. The superficial temporal arteries are without ropiness or tenderness. CV:  RRR Lungs:  CTAB Neck/HEME:  There are no carotid bruits bilaterally.  Neurological examination:  Orientation: The patient is alert and oriented x3. Cranial nerves: There is good facial symmetry with facial hypomimia. The speech is fluent and clear. Soft palate rises symmetrically and there is no tongue deviation. Hearing is intact to conversational tone. Sensation: Sensation is intact to light touch throughout Motor: Strength is at least antigravity x4.  Movement examination: Tone: There is normal tone in the normal Abnormal movements: there is bilateral LE dyskinesia Coordination:  There  is no decremation with RAM's Gait and Station: The patient pushes off to arise.  Once out in the hall, he actually does fairly well, but when he gets back to the chair he nearly misses it, but catches himself before he falls.    I have reviewed and interpreted the following labs independently    Chemistry      Component Value Date/Time   NA 142 11/09/2021 1204   NA 142 04/02/2017 0000  NA 140 07/19/2016 1305   K 4.1 11/09/2021 1204   K 4.4 07/19/2016 1305   CL 106 11/09/2021 1204   CL 105 02/08/2016 1117   CL 107 03/25/2013 0828   CO2 28 11/09/2021 1204   CO2 26 07/19/2016 1305   BUN 41 (H) 11/09/2021 1204   BUN 21 04/02/2017 0000   BUN 22.8 07/19/2016 1305   CREATININE 1.21 11/09/2021 1204   CREATININE 1.1 07/19/2016 1305   GLU 105 04/02/2017 0000      Component Value Date/Time   CALCIUM 9.6 11/09/2021 1204   CALCIUM 9.8 07/19/2016 1305   ALKPHOS 81 11/09/2021 1204   ALKPHOS 76 07/19/2016 1305   AST 18 11/09/2021 1204   AST 18 07/19/2016 1305   ALT <5 11/09/2021 1204   ALT 14 07/19/2016 1305   BILITOT 0.5 11/09/2021 1204   BILITOT 0.70 07/19/2016 1305       Lab Results  Component Value Date   WBC 6.7 11/09/2021   HGB 12.4 (L) 11/09/2021   HCT 38.9 (L) 11/09/2021   MCV 98.5 11/09/2021   PLT 255 11/09/2021    Lab Results  Component Value Date   TSH 3.484 12/08/2020     Total time spent on today's visit was 42 minutes, including both face-to-face time and nonface-to-face time.  Time included that spent on review of records (prior notes available to me/labs/imaging if pertinent), discussing treatment and goals, answering patient's questions and coordinating care.  Cc:  Copland, [name], MD

## 2021-12-04 ENCOUNTER — Other Ambulatory Visit (INDEPENDENT_AMBULATORY_CARE_PROVIDER_SITE_OTHER): Payer: Commercial Managed Care - PPO

## 2021-12-04 ENCOUNTER — Ambulatory Visit (INDEPENDENT_AMBULATORY_CARE_PROVIDER_SITE_OTHER): Payer: Commercial Managed Care - PPO | Admitting: Neurology

## 2021-12-04 ENCOUNTER — Encounter: Payer: Self-pay | Admitting: Neurology

## 2021-12-04 ENCOUNTER — Other Ambulatory Visit: Payer: Self-pay

## 2021-12-04 VITALS — BP 131/86 | HR 89 | Ht 70.0 in | Wt 177.0 lb

## 2021-12-04 DIAGNOSIS — C8190 Hodgkin lymphoma, unspecified, unspecified site: Secondary | ICD-10-CM

## 2021-12-04 DIAGNOSIS — R441 Visual hallucinations: Secondary | ICD-10-CM

## 2021-12-04 DIAGNOSIS — Z5181 Encounter for therapeutic drug level monitoring: Secondary | ICD-10-CM

## 2021-12-04 DIAGNOSIS — G20 Parkinson's disease: Secondary | ICD-10-CM | POA: Diagnosis not present

## 2021-12-04 LAB — COMPREHENSIVE METABOLIC PANEL
ALT: 6 U/L (ref 0–53)
AST: 16 U/L (ref 0–37)
Albumin: 4.5 g/dL (ref 3.5–5.2)
Alkaline Phosphatase: 81 U/L (ref 39–117)
BUN: 40 mg/dL — ABNORMAL HIGH (ref 6–23)
CO2: 27 mEq/L (ref 19–32)
Calcium: 9.6 mg/dL (ref 8.4–10.5)
Chloride: 106 mEq/L (ref 96–112)
Creatinine, Ser: 1.12 mg/dL (ref 0.40–1.50)
GFR: 70.97 mL/min (ref >60.00)
Glucose, Bld: 92 mg/dL (ref 70–99)
Potassium: 4.6 mEq/L (ref 3.5–5.1)
Sodium: 140 mEq/L (ref 135–145)
Total Bilirubin: 0.6 mg/dL (ref 0.2–1.2)
Total Protein: 6.8 g/dL (ref 6.0–8.3)

## 2021-12-04 LAB — CBC
HCT: 40.4 % (ref 39.0–52.0)
Hemoglobin: 13.2 g/dL (ref 13.0–17.0)
MCHC: 32.6 g/dL (ref 30.0–36.0)
MCV: 95.7 fl (ref 78.0–100.0)
Platelets: 242 10*3/uL (ref 150.0–400.0)
RBC: 4.22 Mil/uL (ref 4.22–5.81)
RDW: 13.6 % (ref 11.5–15.5)
WBC: 7 10*3/uL (ref 4.0–10.5)

## 2021-12-04 MED ORDER — CARBIDOPA-LEVODOPA 25-100 MG PO TABS: ORAL_TABLET | ORAL | 1 refills | Status: DC

## 2021-12-04 MED ORDER — CARBIDOPA-LEVODOPA ER 50-200 MG PO TBCR: 1.0000 | EXTENDED_RELEASE_TABLET | Freq: Every day | ORAL | 1 refills | Status: DC

## 2021-12-04 MED ORDER — PRAMIPEXOLE DIHYDROCHLORIDE 0.5 MG PO TABS: 0.5000 mg | ORAL_TABLET | Freq: Three times a day (TID) | ORAL | 1 refills | Status: DC

## 2021-12-04 NOTE — Patient Instructions (Addendum)
Take carbidopa/levodopa 25/100, 2 tablets at 7 AM, 2 tablets at 11 AM, 2 tablet at 3 PM, 2 tablet at 7 PM ADD back in carbidopa/levodopa 50/200 CR at bedtime Reduce pramipexole to 0.5 mg three times per day You need to talk with your urologist about the bladder, potentially looking at medication like myrbetriq Look into the bed rails at [place] provider has requested that you have labwork completed today. The lab is located on the Second floor at [place], within the [facility] office. When you get off the elevator, turn right and go in the [facility] [address]; the first brown door on the left.  Tell the ladies behind the desk that you are there for lab work. If you are not called within 15 minutes please check with the front desk.   Once you complete your labs you are free to go. You will receive a call or message via MyChart with your lab results.   If you decide you want to do physical Therapy just contact the office and we can get that referral sent in for you

## 2021-12-05 LAB — UA/M W/RFLX CULTURE, ROUTINE
Bilirubin, UA: NEGATIVE
Glucose, UA: NEGATIVE
Ketones, UA: NEGATIVE
Leukocytes,UA: NEGATIVE
Nitrite, UA: NEGATIVE
RBC, UA: NEGATIVE
Specific Gravity, UA: 1.024 (ref 1.005–1.030)
Urobilinogen, Ur: 0.2 mg/dL (ref 0.2–1.0)
pH, UA: 5.5 (ref 5.0–7.5)

## 2021-12-05 LAB — MICROSCOPIC EXAMINATION
Bacteria, UA: NONE SEEN
Casts: NONE SEEN /lpf
Epithelial Cells (non renal): NONE SEEN /hpf (ref 0–10)
RBC, Urine: NONE SEEN /hpf (ref 0–2)

## 2021-12-08 ENCOUNTER — Ambulatory Visit: Payer: Commercial Managed Care - PPO | Admitting: Physical Medicine & Rehabilitation

## 2021-12-12 ENCOUNTER — Ambulatory Visit: Payer: Commercial Managed Care - PPO | Admitting: Physical Medicine and Rehabilitation

## 2021-12-15 ENCOUNTER — Telehealth: Payer: Self-pay | Admitting: Neurology

## 2021-12-15 DIAGNOSIS — G20A1 Parkinson's disease without dyskinesia, without mention of fluctuations: Secondary | ICD-10-CM

## 2021-12-15 DIAGNOSIS — G20 Parkinson's disease: Secondary | ICD-10-CM

## 2021-12-15 NOTE — Telephone Encounter (Signed)
Pt called in wanting to give an update. He has been having heavy feet and is tired. He feels "run down" all of a sudden.

## 2021-12-15 NOTE — Telephone Encounter (Signed)
Patient is calling chelsea back.

## 2021-12-15 NOTE — Telephone Encounter (Signed)
Patient advised of decrease dose of Carbidopa/Levodopa 25/100mg  2,2,1,1. Wants to hold off on Physical therapy at this time.Marland Kitchen

## 2021-12-15 NOTE — Telephone Encounter (Signed)
Called patient back He says he is no longer having the hallucinations He began having the following symptoms as soon as he began taking the increase dosage of carbidopa levodopa Severe fatigue unable to work or concentrate wants to lay down all the time Episodes several times a day of sticky foot or freezing unable to walk Feeling strange and not able to stay on task Patient concerned with his employment Patient has been trying to go to gym and ride stationary bike I mentioned PT and that this is a specialized PT dealing with parkinson's he is still declining Patient concerned about the amount of pills he is taking per day and feels like maybe it is too much Patient did go to [facility] last week for epidural for back and said it feels better as does his rotator cuff

## 2021-12-18 NOTE — Telephone Encounter (Signed)
Juliann Pulse called for Brian [name] stating that he had some questions about where he is today with his medication.  She said to just call Lebron back, so he could talk to you.

## 2021-12-18 NOTE — Telephone Encounter (Signed)
Called patient and girlfriend back and let them know that we just adjusted his medication on Friday and patient telling me he is having the same symptoms. I let patient know Dr. Carles Collet is recommending PT for the patient to help with the fatigue the feet sticking and the overall body issues patient  is facing. Pt has been sent to [facility] for this patient and /I have asked patient to wait at least 2 weeks with the medication adjustment

## 2021-12-20 ENCOUNTER — Ambulatory Visit: Payer: Commercial Managed Care - PPO | Admitting: Neurology

## 2021-12-22 ENCOUNTER — Telehealth: Payer: Self-pay | Admitting: Neurology

## 2021-12-22 NOTE — Telephone Encounter (Signed)
Patient would like a call to discuss some things.

## 2021-12-25 NOTE — Telephone Encounter (Signed)
Called patient and he is going to the clinical trials at [facility] He is to start physical therapy on February 10th . Patient is still having frozen feet rigidity Foggy brain and tiredness.

## 2021-12-25 NOTE — Telephone Encounter (Signed)
[name] contacted ov regarding return to work note. Documents can be sent to [email or web address] work tele: [phone] and cell [phone]. Please advise.

## 2021-12-26 NOTE — Telephone Encounter (Signed)
Okay for rtn to work note?

## 2021-12-29 ENCOUNTER — Other Ambulatory Visit: Payer: Self-pay

## 2021-12-29 DIAGNOSIS — G20 Parkinson's disease: Secondary | ICD-10-CM

## 2021-12-29 DIAGNOSIS — G20A1 Parkinson's disease without dyskinesia, without mention of fluctuations: Secondary | ICD-10-CM

## 2021-12-29 NOTE — Telephone Encounter (Signed)
Called patient sending referral to [facility] movement disorder clinic

## 2022-01-02 ENCOUNTER — Ambulatory Visit: Payer: Commercial Managed Care - PPO | Attending: Neurology

## 2022-01-02 ENCOUNTER — Other Ambulatory Visit: Payer: Self-pay

## 2022-01-02 DIAGNOSIS — R2681 Unsteadiness on feet: Secondary | ICD-10-CM | POA: Diagnosis present

## 2022-01-02 DIAGNOSIS — R2689 Other abnormalities of gait and mobility: Secondary | ICD-10-CM | POA: Insufficient documentation

## 2022-01-02 DIAGNOSIS — M6281 Muscle weakness (generalized): Secondary | ICD-10-CM | POA: Diagnosis present

## 2022-01-02 DIAGNOSIS — R262 Difficulty in walking, not elsewhere classified: Secondary | ICD-10-CM | POA: Diagnosis not present

## 2022-01-02 DIAGNOSIS — G20 Parkinson's disease: Secondary | ICD-10-CM | POA: Diagnosis not present

## 2022-01-02 NOTE — Therapy (Signed)
[facility] [place], [place], [place], [address] Phone: [phone]   Fax:  [phone]  Physical Therapy Evaluation  Patient Details  Name: Brian [name] MRN: [id number] Date of Birth: October 11, 1961 Referring Provider (PT): Tat, Eustace Quail, DO   Encounter Date: 01/02/2022   PT End of Session - 01/02/22 1314     Visit Number 1    Number of Visits 16    Date for PT Re-Evaluation 02/27/22    Authorization Type UMR, no VL, medicial necessity review after 25th visit    Authorization - Visit Number 1    Authorization - Number of Visits 24    PT Start Time 2633    PT Stop Time 1410    PT Time Calculation (min) 55 min    Activity Tolerance Patient limited by fatigue    Behavior During Therapy Restless             Past Medical History:  Diagnosis Date   Anxiety    Dysrhythmia    past hx pvc   Goals of care, counseling/discussion 09/25/2018   H/O autologous stem cell transplant ([place])    may 2014  at [place]   History of Bell's palsy    2009  RIGHT SIDE--  HAS 80% FUNCTION / PT STATES A LITTLE ASYMETRICAL AND EFFECTS MOUTH   History of radiation therapy 10/18/17-11/05/17   sprine T1 26 Gy in 13 fractions, spine boost 10 Gy in 5 fractions   Hodgkin's disease, nodular sclerosis, of inguinal region/lower limb ([place]) ONOLOGIST--  DR [facility]NEVER AND A [facility]     SALVAGE CHEMO 2013/  AUTOLOGUS STEM CELL TRANSPLANT MAY 2014 AT [facility]   Mass of right chest wall 09/18/2018   Mass of right inguinal region    PVC (premature ventricular contraction)    "benign"   TIA (transient ischemic attack) 02/2015   "probable TIA"   Wears contact lenses     Past Surgical History:  Procedure Laterality Date   AXILLARY LYMPH NODE BIOPSY Left 02/02/2013   Procedure: NEEDLE LOCALIZED AXILLARY LYMPH NODE BIOPSY;  Surgeon: Haywood Lasso, MD;  Location: [facility];  Service: General;  Laterality: Left;   BACK SURGERY  03/2021   BONE MARROW  BIOPSY  08/26/2012   CYST REMOVAL NECK Left 11/26/1978   LEFT INGUINAL LYMPH NODE BX  09/09/2012   LYMPH NODE BIOPSY N/A 07/28/2015   Procedure: LYMPH NODE BIOPSY;  Surgeon: Melrose Nakayama, MD;  Location: [facility];  Service: Thoracic;  Laterality: N/A;   MASS EXCISION Right 09/19/2018   Procedure: EXCISION OF RIGHT CHEST WALL MASS ERAS PATHWAY;  Surgeon: Fanny Skates, MD;  Location: [facility];  Service: General;  Laterality: Right;   NODE DISSECTION Right 07/28/2015   Procedure: NODE DISSECTION;  Surgeon: Melrose Nakayama, MD;  Location: [facility];  Service: Thoracic;  Laterality: Right;   PLEURA BIOPSY Left 05/31/2014   REMOVAL RIGHT INGUINAL LYMPH NODES  08/16/2011   SCROTAL EXPLORATION Right 01/04/2014   Procedure: SCROTUM EXPLORATION   INGUINAL , EXCISION OF CYSTIC MASS OF RIGHT SPERMATIC CORD, [place];  Surgeon: Franchot Gallo, MD;  Location: [facility];  Service: Urology;  Laterality: Right;   TRANSTHORACIC ECHOCARDIOGRAM  03/18/2013   MILD LVH/  EF 55-60%   VIDEO ASSISTED THORACOSCOPY Left 05/31/2014   Procedure: LEFT VIDEO ASSISTED THORACOSCOPY, PLEURAL BIOPSY;  Surgeon: Melrose Nakayama, MD;  Location: [facility];  Service: Thoracic;  Laterality: Left;   VIDEO ASSISTED THORACOSCOPY Right 07/28/2015   Procedure: RIGHT VIDEO ASSISTED THORACOSCOPY;  Surgeon: Melrose Nakayama, MD;  Location: [facility];  Service: Thoracic;  Laterality: Right;   VIDEO ASSISTED THORACOSCOPY (VATS)/ LYMPH NODE SAMPLING Right 07/28/2015   VIDEO ASSISTED THORACOSCOPY (VATS)/WEDGE RESECTION  05/31/2014   VIDEO BRONCHOSCOPY WITH ENDOBRONCHIAL ULTRASOUND  07/28/2015   VIDEO BRONCHOSCOPY WITH ENDOBRONCHIAL ULTRASOUND N/A 07/28/2015   Procedure: VIDEO BRONCHOSCOPY WITH ENDOBRONCHIAL ULTRASOUND;  Surgeon: Melrose Nakayama, MD;  Location: [facility];  Service: Thoracic;  Laterality: N/A;    There were no vitals filed for this visit.    Subjective Assessment -  01/02/22 1319     Subjective PD x 5.5 years with onset of symptoms and notes gait and balance disturbance and decrease in mobility, ADL participation, and worsening issues with freezing of gait and notes feeling of heaviness of feet. Pt notes time of day seems to be most influential on his freezing of gait    Pertinent History right shoulder injury from broken humerus and rotator cuff tear, recent hx of Hodgkins lymphoma, lumbar surgery    Patient Stated Goals Be more active, get stronger, be able to get in/out of bed easier, reduce freezing when walking    Currently in Pain? Yes    Pain Score 7     Pain Location Shoulder    Pain Orientation Right    Pain Descriptors / Indicators Aching;Sore    Pain Type Acute pain                [facility] PT Assessment - 01/02/22 0001       Assessment   Medical Diagnosis PD (Parkinson's disease    Referring Provider (PT) Tat, Eustace Quail, DO    Prior Therapy not for PD, other musculskeletal injuries      Balance Screen   Has the patient fallen in the past 6 months Yes    How many times? 4-5    Has the patient had a decrease in activity level because of a fear of falling?  Yes    Is the patient reluctant to leave their home because of a fear of falling?  Yes      [place] residence    Living Arrangements Spouse/significant other    Type of [place] to enter    Entrance Stairs-Number of Steps 2    [place] to live on main level with bedroom/bathroom    [place] - 2 wheels;Cane - single point;Wheelchair - manual;Grab bars - tub/shower      Prior Function   Level of Independence Requires assistive device for independence    Leisure running, exercise, outdoors, time with family      Cognition   Memory Impaired    Memory Impairment Retrieval deficit;Decreased recall of new information      Coordination   Gross Motor Movements are Fluid and Coordinated No    Heel  Shin Test impaired      Posture/Postural Control   Posture/Postural Control Postural limitations    Postural Limitations Rounded Shoulders;Forward head;Flexed trunk      Tone   Assessment Location Right Lower Extremity;Left Lower Extremity      ROM / Strength   AROM / PROM / Strength AROM;Strength      AROM   Overall AROM  Within functional limits for tasks performed    Overall AROM Comments BLE WFL. Right shoulder  50% limited from recent hx of injury      Strength   Overall Strength Deficits    Overall Strength Comments demo 3+/5 gross BLE strength    Strength Assessment Site Hip;Knee;Ankle      Bed Mobility   Bed Mobility Supine to Sit;Sit to Supine    Supine to Sit Moderate Assistance - Patient 50-74%    Sit to Supine Contact Guard/Touching assist      Transfers   Transfers Sit to Stand;Stand Pivot Transfers    Sit to Stand 4: Min guard    Five time sit to stand comments  57 sec    Stand Pivot Transfers 4: Min guard      Ambulation/Gait   Ambulation/Gait Yes    Ambulation/Gait Assistance 4: Min guard    Assistive device Straight cane    Gait Pattern Festinating    Ambulation Surface Level;Indoor    Gait velocity decreased      Standardized Balance Assessment   Standardized Balance Assessment Timed Up and Go Test      Timed Up and Go Test   Normal TUG (seconds) 33.75      RLE Tone   RLE Tone Moderate      LLE Tone   LLE Tone Moderate                        Objective measurements completed on examination: See above findings.       [facility] Adult PT Treatment/Exercise - 01/02/22 0001       Bed Mobility   Supine to Sit Details (indicate cue type and reason) training/demo in log rolling and sequencing UE push-off    Sit to Supine - Details (indicate cue type and reason) training in cues/sequence      Posture/Postural Control   Posture Comments HEP for tall sitting, scapular retraction 3x10 3 sec hold                     PT  Education - 01/02/22 1417     Education Details regarding scope of practice and rationale for intervention    Person(s) Educated Patient;Caregiver(s)   significant other   Methods Explanation    Comprehension Verbalized understanding              PT Short Term Goals - 01/02/22 1424       PT SHORT TERM GOAL #1   Title Patient will be independent in static postural HEP to improve alignment and enhance stability    Time 4    Period Weeks    Status New    Target Date 01/30/22      PT SHORT TERM GOAL #2   Title Improve BLE strength to 4/5 gross strength to enhance functional activity tolerance    Baseline 3+/5 BLE    Time 4    Period Weeks    Status New    Target Date 01/30/22      PT SHORT TERM GOAL #3   Title Manifest improved BLE strength and balance as evidenced by time of 25 sec 5xSTS    Baseline 57 sec    Time 4    Period Weeks    Status New    Target Date 01/30/22      PT SHORT TERM GOAL #4   Title Demo bed mobility with supervision to reduce level of assistance from cargivers    Baseline CGA sit to supine, mod A supine to sit  Time 4    Period Weeks    Status New    Target Date 01/30/22               PT Long Term Goals - 01/02/22 1426       PT LONG TERM GOAL #1   Title Pt will be independent with Parkinson's specific HEP for improved posture, balance, and gait.    Time 8    Period Weeks    Status New    Target Date 02/27/22      PT LONG TERM GOAL #2   Title Demo improved safety with ambulation as evidenced by time of 20 sec TUG test    Time 8    Period Weeks    Status New    Target Date 02/27/22                    Plan - 01/02/22 1418     Clinical Impression Statement Pt is 61 yo man with hx of PD who presents with deficits in mobility, gait deviations, balance disturbances, motor control deficits, activity tolerance limitations, increased need for caregiver assistance, and high risk for falls.  PT services indicated to improve  functional mobility and reduce level of assistance from cargeivers to enable safe bed mobility, transfers, and ambulation with reduce risk for falls to enable greater independence and minimize risk for injury    Personal Factors and Comorbidities Comorbidity 3+;Time since onset of injury/illness/exacerbation    Comorbidities hx of CA, right shoulder injury, recent back surgery    Examination-Activity Limitations Bed Mobility;Carry;Lift;Toileting;Stand;Stairs;Squat;Reach Overhead;Locomotion Level;Transfers    Examination-Participation Restrictions Cleaning;Community Activity;Laundry;Occupation;Meal Prep    Stability/Clinical Decision Making Evolving/Moderate complexity    Clinical Decision Making Moderate    Rehab Potential Good    PT Frequency 2x / week    PT Duration 8 weeks    PT Treatment/Interventions ADLs/Self Care Home Management;Electrical Stimulation;DME Instruction;Moist Heat;Gait training;Stair training;Functional mobility training;Therapeutic activities;Therapeutic exercise;Balance training;Neuromuscular re-education;Manual techniques;Patient/family education    PT Next Visit Plan improved gait pattern with visual references, posture/balance control    PT Home Exercise Plan Tall sitting 3x30 sec, scapular retraction 3x10    Recommended Other Services discussed benefits of OT services to address ADL deficits and right shoulder injury    Consulted and Agree with Plan of Care Patient;Family member/caregiver    Family Member Consulted significant other             Patient will benefit from skilled therapeutic intervention in order to improve the following deficits and impairments:  Abnormal gait, Decreased activity tolerance, Decreased balance, Decreased coordination, Decreased endurance, Decreased mobility, Decreased strength, Difficulty walking, Postural dysfunction, Impaired perceived functional ability, Pain, Improper body mechanics  Visit Diagnosis: Difficulty in walking, not  elsewhere classified  Muscle weakness (generalized)  Unsteadiness on feet  Other abnormalities of gait and mobility     Problem List Patient Active Problem List   Diagnosis Date Noted   Closed fracture of right proximal humerus 10/03/2021   Traumatic complete tear of right rotator cuff 10/03/2021   Lumbar radiculopathy 10/03/2021   Goals of care, counseling/discussion 09/25/2018   Mass of right chest wall 09/18/2018   PD (Parkinson's disease) ([place]) 11/27/2016   Lymphadenopathy 07/28/2015   Mediastinal adenopathy 07/14/2015   Aphasia 03/04/2015   Slurred speech 03/04/2015   Pleural mass 05/31/2014   Abdominal pain, epigastric 02/02/2014   Weight loss 08/12/2012   H/O Bell's palsy 08/12/2012   Hodgkin's disease 08/09/2011    Laurina Bustle  Edilia Ghuman, PT 01/02/2022, 2:30 PM  [facility] [address] [place], [place], [address] Phone: [phone]   Fax:  [phone]  Name: Brian [name] MRN: [id number] Date of Birth: July 06, 1961

## 2022-01-02 NOTE — Patient Instructions (Signed)
Access Code: [email or web address] URL: [email or web address] Date: 01/02/2022 Prepared by: Sherlyn Lees  Exercises Correct Seated Posture - 1 x daily - 7 x weekly Seated Scapular Retraction - 1-3 x daily - 7 x weekly - 3 sets - 10 reps - 3 sec hold

## 2022-01-04 NOTE — Telephone Encounter (Signed)
Can email be resent to HR [email or web address] or contact her at tele: [phone]

## 2022-01-05 ENCOUNTER — Ambulatory Visit: Payer: Commercial Managed Care - PPO

## 2022-01-05 NOTE — Telephone Encounter (Signed)
Patient called back stating they have not received FMLA yet and he believes the email might have been wrong. He provided me with 3 other emails to send it to. Please advise.   [email or web address]   [email or web address]   And his is [email or web address]

## 2022-01-08 NOTE — Telephone Encounter (Signed)
Spoke with [place]- she has the emails (confirms that they were in her [place] folder).

## 2022-01-08 NOTE — Telephone Encounter (Signed)
Peter Garter contacted office regarding pt's [place] papers. Please contact [phone].  Please advise.

## 2022-01-09 ENCOUNTER — Ambulatory Visit: Payer: Commercial Managed Care - PPO | Admitting: Physical Therapy

## 2022-01-09 ENCOUNTER — Other Ambulatory Visit: Payer: Self-pay

## 2022-01-09 ENCOUNTER — Encounter: Payer: Self-pay | Admitting: Physical Therapy

## 2022-01-09 DIAGNOSIS — R2681 Unsteadiness on feet: Secondary | ICD-10-CM

## 2022-01-09 DIAGNOSIS — R2689 Other abnormalities of gait and mobility: Secondary | ICD-10-CM

## 2022-01-09 DIAGNOSIS — R262 Difficulty in walking, not elsewhere classified: Secondary | ICD-10-CM | POA: Diagnosis not present

## 2022-01-09 NOTE — Patient Instructions (Signed)
Access Code: [email or web address] URL: [email or web address] Date: 01/09/2022 Prepared by: [facility]  Exercises Mini Squat with Counter Support - 1 x daily - 7 x weekly - 1-2 sets - 10 reps  (really PWR! Up posture in standing) Standing Weight Shift Side to Side - 1-2 x daily - 7 x weekly - 1-2 sets - 10 reps   Plus seated PWR! Up, seated rocking through hips (only), and seated PWR! step

## 2022-01-09 NOTE — Therapy (Signed)
[facility] [address] [place], [place], [place], [address] Phone: [phone]   Fax:  [phone]  Physical Therapy Treatment  Patient Details  Name: Brian [name] MRN: [id number] Date of Birth: Aug 28, 1961 Referring Provider (PT): Tat, Eustace Quail, DO   Encounter Date: 01/09/2022   PT End of Session - 01/09/22 1318     Visit Number 2    Number of Visits 16    Date for PT Re-Evaluation 02/27/22    Authorization Type UMR, no VL, medicial necessity review after 25th visit    Authorization - Visit Number 2    Authorization - Number of Visits 24    PT Start Time 1791    PT Stop Time 1406    PT Time Calculation (min) 48 min    Activity Tolerance Patient tolerated treatment well    Behavior During Therapy Restless;WFL for tasks assessed/performed             Past Medical History:  Diagnosis Date   Anxiety    Dysrhythmia    past hx pvc   Goals of care, counseling/discussion 09/25/2018   H/O autologous stem cell transplant ([place])    may 2014  at [place]   History of Bell's palsy    2009  RIGHT SIDE--  HAS 80% FUNCTION / PT STATES A LITTLE ASYMETRICAL AND EFFECTS MOUTH   History of radiation therapy 10/18/17-11/05/17   sprine T1 26 Gy in 13 fractions, spine boost 10 Gy in 5 fractions   Hodgkin's disease, nodular sclerosis, of inguinal region/lower limb ([place]) ONOLOGIST--  DR [facility]NEVER AND A [facility]     SALVAGE CHEMO 2013/  AUTOLOGUS STEM CELL TRANSPLANT MAY 2014 AT [facility]   Mass of right chest wall 09/18/2018   Mass of right inguinal region    PVC (premature ventricular contraction)    "benign"   TIA (transient ischemic attack) 02/2015   "probable TIA"   Wears contact lenses     Past Surgical History:  Procedure Laterality Date   AXILLARY LYMPH NODE BIOPSY Left 02/02/2013   Procedure: NEEDLE LOCALIZED AXILLARY LYMPH NODE BIOPSY;  Surgeon: Haywood Lasso, MD;  Location: [place];  Service: General;  Laterality: Left;    BACK SURGERY  03/2021   BONE MARROW BIOPSY  08/26/2012   CYST REMOVAL NECK Left 11/26/1978   LEFT INGUINAL LYMPH NODE BX  09/09/2012   LYMPH NODE BIOPSY N/A 07/28/2015   Procedure: LYMPH NODE BIOPSY;  Surgeon: Melrose Nakayama, MD;  Location: [place];  Service: Thoracic;  Laterality: N/A;   MASS EXCISION Right 09/19/2018   Procedure: EXCISION OF RIGHT CHEST WALL MASS ERAS PATHWAY;  Surgeon: Fanny Skates, MD;  Location: [place];  Service: General;  Laterality: Right;   NODE DISSECTION Right 07/28/2015   Procedure: NODE DISSECTION;  Surgeon: Melrose Nakayama, MD;  Location: [facility];  Service: Thoracic;  Laterality: Right;   PLEURA BIOPSY Left 05/31/2014   REMOVAL RIGHT INGUINAL LYMPH NODES  08/16/2011   SCROTAL EXPLORATION Right 01/04/2014   Procedure: SCROTUM EXPLORATION   INGUINAL , EXCISION OF CYSTIC MASS OF RIGHT SPERMATIC CORD, [place];  Surgeon: Franchot Gallo, MD;  Location: [facility];  Service: Urology;  Laterality: Right;   TRANSTHORACIC ECHOCARDIOGRAM  03/18/2013   MILD LVH/  EF 55-60%   VIDEO ASSISTED THORACOSCOPY Left 05/31/2014   Procedure: LEFT VIDEO ASSISTED THORACOSCOPY, PLEURAL BIOPSY;  Surgeon: Melrose Nakayama, MD;  Location: [place] [place];  Service: Thoracic;  Laterality: Left;   VIDEO ASSISTED THORACOSCOPY Right 07/28/2015   Procedure: RIGHT VIDEO ASSISTED THORACOSCOPY;  Surgeon: Melrose Nakayama, MD;  Location: [place];  Service: Thoracic;  Laterality: Right;   VIDEO ASSISTED THORACOSCOPY (VATS)/ LYMPH NODE SAMPLING Right 07/28/2015   VIDEO ASSISTED THORACOSCOPY (VATS)/WEDGE RESECTION  05/31/2014   VIDEO BRONCHOSCOPY WITH ENDOBRONCHIAL ULTRASOUND  07/28/2015   VIDEO BRONCHOSCOPY WITH ENDOBRONCHIAL ULTRASOUND N/A 07/28/2015   Procedure: VIDEO BRONCHOSCOPY WITH ENDOBRONCHIAL ULTRASOUND;  Surgeon: Melrose Nakayama, MD;  Location: [place];  Service: Thoracic;  Laterality: N/A;    There were no vitals filed for this  visit.   Subjective Assessment - 01/09/22 1318     Subjective Severe freezing episodes -specifically around 11 and 2.  More unpredictable over the past few weeks, so much so that they interfere with driving.  Sometimes these episodes last for hours, and it's not just movement freezing, it's more thinking difficulty as well.  Have more trouble in public places.    Pertinent History right shoulder injury from broken humerus and rotator cuff tear, recent hx of Hodgkins lymphoma, lumbar surgery    Patient Stated Goals Be more active, get stronger, be able to get in/out of bed easier, reduce freezing when walking    Currently in Pain? Yes    Pain Score 6     Pain Location Back    Pain Descriptors / Indicators Aching;Sore    Pain Type Acute pain    Pain Onset More than a month ago    Pain Frequency Intermittent    Aggravating Factors  unsure-history of back painand surgery    Pain Relieving Factors unsure                               [facility] Adult PT Treatment/Exercise - 01/09/22 0001       Transfers   Transfers Sit to Stand;Stand to Sit    Sit to Stand 5: Supervision;Without upper extremity assist;From bed    Stand to Sit 5: Supervision;Without upper extremity assist;To bed    Number of Reps 1 set;Other reps (comment)   additional 3-5 reps, with cues for upright posture upon standing     Ambulation/Gait   Ambulation/Gait Yes    Ambulation/Gait Assistance 5: Supervision    Ambulation Distance (Feet) 40 Feet   x 6   Assistive device None    Gait Pattern Step-through pattern;Narrow base of support;Poor foot clearance - left;Poor foot clearance - right    Ambulation Surface Level;Indoor    Pre-Gait Activities Cues for PWR! Up posture in standing, then rocking side to side through hips, then START gait with BIG step pattern.  Cues for increased effort, large amplitude movement patterns.      Self-Care   Self-Care Other Self-Care Comments    Other Self-Care Comments   Educated on medication timing in regards to protein/meals.  Discussed freezing episodes-where they occur, frequency, severity of episodes.  Discussed tips and use of strategies in therapy sessions to help lessen frequence, severity of freezing episodes.   Discussed use of large amplitude, higher effort/intensity of movement patterns to recalibrate how paitent moves for improved functional mobility.      Neuro Re-ed    Neuro Re-ed Details  Seated modified PWR! Moves (due to pt's hx of RUE fracture and pain):  forward lean to upright sit posture x 5; lateral weigthshift with rocking x 5 reps; then marching in place x  5 reps (cues to stomp), side step out and in x 5 reps (cues to stomp).  Standing PWR! Up to upright posture, standing wide BOS lateral weightshifting with tactile cues through hips.      Exercises   Exercises Knee/Hip      Knee/Hip Exercises: Aerobic   Nustep BLEs, x 6 minutes, Level 4>5, cues to keep steps/min over 100-110 for increased effort with exercise.  Pt rates effort level as 8/10.  HR after activity 116 bpm.                     PT Education - 01/09/22 1530     Education Details Large amplitude movement patterns to help lessen freezing episodes; HEP additions    Person(s) Educated Patient;Caregiver(s)   significant other   Methods Explanation;Demonstration;Handout;Verbal cues    Comprehension Verbalized understanding;Returned demonstration;Need further instruction;Verbal cues required              PT Short Term Goals - 01/02/22 1424       PT SHORT TERM GOAL #1   Title Patient will be independent in static postural HEP to improve alignment and enhance stability    Time 4    Period Weeks    Status New    Target Date 01/30/22      PT SHORT TERM GOAL #2   Title Improve BLE strength to 4/5 gross strength to enhance functional activity tolerance    Baseline 3+/5 BLE    Time 4    Period Weeks    Status New    Target Date 01/30/22      PT SHORT TERM  GOAL #3   Title Manifest improved BLE strength and balance as evidenced by time of 25 sec 5xSTS    Baseline 57 sec    Time 4    Period Weeks    Status New    Target Date 01/30/22      PT SHORT TERM GOAL #4   Title Demo bed mobility with supervision to reduce level of assistance from cargivers    Baseline CGA sit to supine, mod A supine to sit    Time 4    Period Weeks    Status New    Target Date 01/30/22               PT Long Term Goals - 01/02/22 1426       PT LONG TERM GOAL #1   Title Pt will be independent with Parkinson's specific HEP for improved posture, balance, and gait.    Time 8    Period Weeks    Status New    Target Date 02/27/22      PT LONG TERM GOAL #2   Title Demo improved safety with ambulation as evidenced by time of 20 sec TUG test    Time 8    Period Weeks    Status New    Target Date 02/27/22                   Plan - 01/09/22 1531     Clinical Impression Statement Skilled PT session focused on large amplitude movement patterns to help with muscle re-education, recalibration of movement patterns, specifically today, focusing on decreasing freezing episodes.  Worked in sitting, standing, and gait positions, and utilized aerobic equipment to help reinforce increased intensity of movement patterns.  Pt overall seems to be moving better today compared to PT evaluation; however, his freezing episodes can be unpredictable, variable,  and can last for quite some time.  No episodes of freezing noted in PT session today.  Pt's movement patterns appear to improve with attention to large amplitude and increased intensity of movement, and PT provides frequent cues for reminders throughout session.    Personal Factors and Comorbidities Comorbidity 3+;Time since onset of injury/illness/exacerbation    Comorbidities hx of CA, right shoulder injury, recent back surgery    Examination-Activity Limitations Bed  Mobility;Carry;Lift;Toileting;Stand;Stairs;Squat;Reach Overhead;Locomotion Level;Transfers    Examination-Participation Restrictions Cleaning;Community Activity;Laundry;Occupation;Meal Prep    Stability/Clinical Decision Making Evolving/Moderate complexity    Rehab Potential Good    PT Frequency 2x / week    PT Duration 8 weeks    PT Treatment/Interventions ADLs/Self Care Home Management;Electrical Stimulation;DME Instruction;Moist Heat;Gait training;Stair training;Functional mobility training;Therapeutic activities;Therapeutic exercise;Balance training;Neuromuscular re-education;Manual techniques;Patient/family education    PT Next Visit Plan Large amplitude movement patterns (modified seated and standing PWR! Moves-avoiding pain with R shoulder), gait training, tips to reduce freezing episodes with gait.    PT Home Exercise Plan Tall sitting 3x30 sec, scapular retraction 3x10    Consulted and Agree with Plan of Care Patient;Family member/caregiver    Family Member Consulted significant other             Patient will benefit from skilled therapeutic intervention in order to improve the following deficits and impairments:  Abnormal gait, Decreased activity tolerance, Decreased balance, Decreased coordination, Decreased endurance, Decreased mobility, Decreased strength, Difficulty walking, Postural dysfunction, Impaired perceived functional ability, Pain, Improper body mechanics  Visit Diagnosis: Other abnormalities of gait and mobility  Unsteadiness on feet     Problem List Patient Active Problem List   Diagnosis Date Noted   Closed fracture of right proximal humerus 10/03/2021   Traumatic complete tear of right rotator cuff 10/03/2021   Lumbar radiculopathy 10/03/2021   Goals of care, counseling/discussion 09/25/2018   Mass of right chest wall 09/18/2018   PD (Parkinson's disease) ([facility]) 11/27/2016   Lymphadenopathy 07/28/2015   Mediastinal adenopathy 07/14/2015   Aphasia  03/04/2015   Slurred speech 03/04/2015   Pleural mass 05/31/2014   Abdominal pain, epigastric 02/02/2014   Weight loss 08/12/2012   H/O Bell's palsy 08/12/2012   Hodgkin's disease 08/09/2011    Jenna Routzahn W., PT 01/09/2022, 3:38 PM  [facility] [address], [place], [place], [address] Phone: [phone]   Fax:  [phone]  Name: Brian [name] MRN: [id number] Date of Birth: 09-08-1961

## 2022-01-11 ENCOUNTER — Ambulatory Visit: Payer: Commercial Managed Care - PPO

## 2022-01-11 ENCOUNTER — Other Ambulatory Visit: Payer: Self-pay

## 2022-01-11 DIAGNOSIS — R2689 Other abnormalities of gait and mobility: Secondary | ICD-10-CM

## 2022-01-11 DIAGNOSIS — R262 Difficulty in walking, not elsewhere classified: Secondary | ICD-10-CM | POA: Diagnosis not present

## 2022-01-11 DIAGNOSIS — M6281 Muscle weakness (generalized): Secondary | ICD-10-CM

## 2022-01-11 DIAGNOSIS — R2681 Unsteadiness on feet: Secondary | ICD-10-CM

## 2022-01-11 NOTE — Therapy (Signed)
[facility] [place], [place], [place], [address] Phone: [phone]   Fax:  [phone]  Physical Therapy Treatment  Patient Details  Name: Brian [name] MRN: [id number] Date of Birth: [birth year] Referring Provider (PT): Tat, Eustace Quail, DO   Encounter Date: 01/11/2022   PT End of Session - 01/11/22 1404     Visit Number 3    Number of Visits 16    Date for PT Re-Evaluation 02/27/22    Authorization Type UMR, no VL, medicial necessity review after 25th visit    Authorization - Visit Number 3    Authorization - Number of Visits 24    PT Start Time 0258    PT Stop Time 1445    PT Time Calculation (min) 42 min    Activity Tolerance Patient tolerated treatment well    Behavior During Therapy Restless;WFL for tasks assessed/performed             Past Medical History:  Diagnosis Date   Anxiety    Dysrhythmia    past hx pvc   Goals of care, counseling/discussion 09/25/2018   H/O autologous stem cell transplant ([place])    may 2014  at [facility]   History of Bell's palsy    2009  RIGHT SIDE--  HAS 80% FUNCTION / PT STATES A LITTLE ASYMETRICAL AND EFFECTS MOUTH   History of radiation therapy 10/18/17-11/05/17   sprine T1 26 Gy in 13 fractions, spine boost 10 Gy in 5 fractions   Hodgkin's disease, nodular sclerosis, of inguinal region/lower limb ([place]) ONOLOGIST--  DR [facility]NEVER AND A [facility]     SALVAGE CHEMO 2013/  AUTOLOGUS STEM CELL TRANSPLANT MAY 2014 AT [facility]   Mass of right chest wall 09/18/2018   Mass of right inguinal region    PVC (premature ventricular contraction)    "benign"   TIA (transient ischemic attack) 02/2015   "probable TIA"   Wears contact lenses     Past Surgical History:  Procedure Laterality Date   AXILLARY LYMPH NODE BIOPSY Left 02/02/2013   Procedure: NEEDLE LOCALIZED AXILLARY LYMPH NODE BIOPSY;  Surgeon: Haywood Lasso, MD;  Location: [place] [facility];  Service: General;  Laterality: Left;    BACK SURGERY  03/2021   BONE MARROW BIOPSY  08/26/2012   CYST REMOVAL NECK Left 11/26/1978   LEFT INGUINAL LYMPH NODE BX  09/09/2012   LYMPH NODE BIOPSY N/A 07/28/2015   Procedure: LYMPH NODE BIOPSY;  Surgeon: Melrose Nakayama, MD;  Location: [facility];  Service: Thoracic;  Laterality: N/A;   MASS EXCISION Right 09/19/2018   Procedure: EXCISION OF RIGHT CHEST WALL MASS ERAS PATHWAY;  Surgeon: Fanny Skates, MD;  Location: [facility];  Service: General;  Laterality: Right;   NODE DISSECTION Right 07/28/2015   Procedure: NODE DISSECTION;  Surgeon: Melrose Nakayama, MD;  Location: [facility];  Service: Thoracic;  Laterality: Right;   PLEURA BIOPSY Left 05/31/2014   REMOVAL RIGHT INGUINAL LYMPH NODES  08/16/2011   SCROTAL EXPLORATION Right 01/04/2014   Procedure: SCROTUM EXPLORATION   INGUINAL , EXCISION OF CYSTIC MASS OF RIGHT SPERMATIC CORD, [place];  Surgeon: Franchot Gallo, MD;  [facility];  Service: Urology;  Laterality: Right;   TRANSTHORACIC ECHOCARDIOGRAM  03/18/2013   MILD LVH/  EF 55-60%   VIDEO ASSISTED THORACOSCOPY Left 05/31/2014   Procedure: LEFT VIDEO ASSISTED THORACOSCOPY, PLEURAL BIOPSY;  Surgeon: Melrose Nakayama, MD;  Location: [place];  Service: Thoracic;  Laterality: Left;   VIDEO ASSISTED THORACOSCOPY Right 07/28/2015   Procedure: RIGHT VIDEO ASSISTED THORACOSCOPY;  Surgeon: Melrose Nakayama, MD;  Location: [facility];  Service: Thoracic;  Laterality: Right;   VIDEO ASSISTED THORACOSCOPY (VATS)/ LYMPH NODE SAMPLING Right 07/28/2015   VIDEO ASSISTED THORACOSCOPY (VATS)/WEDGE RESECTION  05/31/2014   VIDEO BRONCHOSCOPY WITH ENDOBRONCHIAL ULTRASOUND  07/28/2015   VIDEO BRONCHOSCOPY WITH ENDOBRONCHIAL ULTRASOUND N/A 07/28/2015   Procedure: VIDEO BRONCHOSCOPY WITH ENDOBRONCHIAL ULTRASOUND;  Surgeon: Melrose Nakayama, MD;  Location: [facility];  Service: Thoracic;  Laterality: N/A;    There were no vitals filed for this  visit.   Subjective Assessment - 01/11/22 1406     Subjective Pt notes freezing episodes continue everyday and notes they have been less frequent but notes his freezing episodes can have duration of hours    Pertinent History right shoulder injury from broken humerus and rotator cuff tear, recent hx of Hodgkins lymphoma, lumbar surgery    Patient Stated Goals Be more active, get stronger, be able to get in/out of bed easier, reduce freezing when walking    Pain Onset More than a month ago                [facility] PT Assessment - 01/11/22 0001       Assessment   Medical Diagnosis PD (Parkinson's disease    Referring Provider (PT) Tat, Eustace Quail, DO                           [facility] Adult PT Treatment/Exercise - 01/11/22 0001       Bed Mobility   Bed Mobility Rolling Right;Rolling Left;Supine to Sit;Sit to Supine    Rolling Right Minimal Assistance - Patient > 75%    Rolling Left Minimal Assistance - Patient > 75%    Supine to Sit Moderate Assistance - Patient 50-74%    Sit to Supine Contact Guard/Touching assist    Sit to Supine - Details (indicate cue type and reason) training in techniques to build momentum and crossing midline for reaching in order to achieve sidelying      Self-Care   Self-Care Other Self-Care Comments    Other Self-Care Comments  education/demonstration of right shoulder supine AAROM for external rotation/abduction to improve comfort with ADL. Compiled list of various after-market bed assist rails to aid in rolling and supine<>sit to reduce level of assist from caregivers      Neuro Re-ed    Neuro Re-ed Details  seated modified PWR! activities with emphasis on trunk flexion/extension progressing to hip flexion for foot clearance to reduce freezing episode. Supine lumbar trunk rolls with metronome sequence for improve lumbar flexibility and dissociative movements for improved mobility                       PT Short Term Goals -  01/02/22 1424       PT SHORT TERM GOAL #1   Title Patient will be independent in static postural HEP to improve alignment and enhance stability    Time 4    Period Weeks    Status New    Target Date 01/30/22      PT SHORT TERM GOAL #2   Title Improve BLE strength to 4/5 gross strength to enhance functional activity tolerance    Baseline 3+/5 BLE    Time 4    Period Weeks    Status New  Target Date 01/30/22      PT SHORT TERM GOAL #3   Title Manifest improved BLE strength and balance as evidenced by time of 25 sec 5xSTS    Baseline 57 sec    Time 4    Period Weeks    Status New    Target Date 01/30/22      PT SHORT TERM GOAL #4   Title Demo bed mobility with supervision to reduce level of assistance from cargivers    Baseline CGA sit to supine, mod A supine to sit    Time 4    Period Weeks    Status New    Target Date 01/30/22               PT Long Term Goals - 01/02/22 1426       PT LONG TERM GOAL #1   Title Pt will be independent with Parkinson's specific HEP for improved posture, balance, and gait.    Time 8    Period Weeks    Status New    Target Date 02/27/22      PT LONG TERM GOAL #2   Title Demo improved safety with ambulation as evidenced by time of 20 sec TUG test    Time 8    Period Weeks    Status New    Target Date 02/27/22                   Plan - 01/11/22 1528     Clinical Impression Statement Pt presents with freezing episode when sitting EOM and requiring tactile cues for initaition and maintenance of movement to build momentum and amplitude. Continued with techniques to promote mental imagery and followed with practiced movement to reinforce large amplitude movement with improved carryover as he was able to overcome freezing episode.  Practice with bed mobility to improve independence with supine-sit demonstrating difficulty with sequence and maintenance of motion as he becomes hung on EOM with lifting LE. Pt  education/dmeonstration of techniuqes for improved bed mobility and use of assist rails to promote increased particpation/capability with bed mobility and positioning    Personal Factors and Comorbidities Comorbidity 3+;Time since onset of injury/illness/exacerbation    Comorbidities hx of CA, right shoulder injury, recent back surgery    Examination-Activity Limitations Bed Mobility;Carry;Lift;Toileting;Stand;Stairs;Squat;Reach Overhead;Locomotion Level;Transfers    Examination-Participation Restrictions Cleaning;Community Activity;Laundry;Occupation;Meal Prep    Stability/Clinical Decision Making Evolving/Moderate complexity    Rehab Potential Good    PT Frequency 2x / week    PT Duration 8 weeks    PT Treatment/Interventions ADLs/Self Care Home Management;Electrical Stimulation;DME Instruction;Moist Heat;Gait training;Stair training;Functional mobility training;Therapeutic activities;Therapeutic exercise;Balance training;Neuromuscular re-education;Manual techniques;Patient/family education    PT Next Visit Plan Large amplitude movement patterns (modified seated and standing PWR! Moves-avoiding pain with R shoulder), gait training, tips to reduce freezing episodes with gait.    PT Home Exercise Plan Tall sitting 3x30 sec, scapular retraction 3x10, supine lumbar trunk rotation    Consulted and Agree with Plan of Care Patient    Family Member Consulted significant other             Patient will benefit from skilled therapeutic intervention in order to improve the following deficits and impairments:  Abnormal gait, Decreased activity tolerance, Decreased balance, Decreased coordination, Decreased endurance, Decreased mobility, Decreased strength, Difficulty walking, Postural dysfunction, Impaired perceived functional ability, Pain, Improper body mechanics  Visit Diagnosis: Other abnormalities of gait and mobility  Unsteadiness on feet  Difficulty in walking, not elsewhere  classified  Muscle weakness (generalized)     Problem List Patient Active Problem List   Diagnosis Date Noted   Closed fracture of right proximal humerus 10/03/2021   Traumatic complete tear of right rotator cuff 10/03/2021   Lumbar radiculopathy 10/03/2021   Goals of care, counseling/discussion 09/25/2018   Mass of right chest wall 09/18/2018   PD (Parkinson's disease) ([place]) 11/27/2016   Lymphadenopathy 07/28/2015   Mediastinal adenopathy 07/14/2015   Aphasia 03/04/2015   Slurred speech 03/04/2015   Pleural mass 05/31/2014   Abdominal pain, epigastric 02/02/2014   Weight loss 08/12/2012   H/O Bell's palsy 08/12/2012   Hodgkin's disease 08/09/2011    Toniann Fail, PT 01/11/2022, 3:33 PM  [facility] [address] [place], [place], [address] Phone: [phone]   Fax:  [phone]  Name: Brian [name] MRN: [id number] Date of Birth: [birth year]

## 2022-01-12 ENCOUNTER — Telehealth: Payer: Self-pay | Admitting: Neurology

## 2022-01-12 NOTE — Telephone Encounter (Signed)
Brian [name] is on the DPR I doubled checked before calling her. She wanted to let us know [name] is still having freezing episodes. I did ask her if he had started physical therapy and he has and I also asked if he has had his second opinion or started the clinical trials and she said they keep missing each other but he is scheduled for March. I let her know I have spoken to Wallace at length over the past month and checked in with him often. She said she understood and I di let her know as well that at his time Dr. Carles Collet wanted to see how the two other things work for patient before changing his medication at his time

## 2022-01-12 NOTE — Telephone Encounter (Signed)
Patient's girlfriend Tye [place] (no DPR on file to share [place]) called requesting to update Vikki Ports about her concern since his medication change.  She said he is aware she is calling and it is fine to call the patient back directly, if necessary.

## 2022-01-16 ENCOUNTER — Other Ambulatory Visit: Payer: Self-pay

## 2022-01-16 ENCOUNTER — Ambulatory Visit: Payer: Commercial Managed Care - PPO

## 2022-01-16 DIAGNOSIS — R2689 Other abnormalities of gait and mobility: Secondary | ICD-10-CM

## 2022-01-16 DIAGNOSIS — R262 Difficulty in walking, not elsewhere classified: Secondary | ICD-10-CM | POA: Diagnosis not present

## 2022-01-16 DIAGNOSIS — R2681 Unsteadiness on feet: Secondary | ICD-10-CM

## 2022-01-16 DIAGNOSIS — M6281 Muscle weakness (generalized): Secondary | ICD-10-CM

## 2022-01-16 NOTE — Therapy (Signed)
[facility] [place], [place], [place], [address] Phone: [phone]   Fax:  [phone]  Physical Therapy Treatment  Patient Details  Name: Brian [name] MRN: [id number] Date of Birth: 1961-10-05 Referring Provider (PT): Tat, Eustace Quail, DO   Encounter Date: 01/16/2022   PT End of Session - 01/16/22 1453     Visit Number 4    Number of Visits 16    Date for PT Re-Evaluation 02/27/22    Authorization Type UMR, no VL, medicial necessity review after 25th visit    Authorization - Visit Number 4    Authorization - Number of Visits 24    PT Start Time 9179    PT Stop Time 1530    PT Time Calculation (min) 45 min    Activity Tolerance Patient tolerated treatment well    Behavior During Therapy Restless;WFL for tasks assessed/performed             Past Medical History:  Diagnosis Date   Anxiety    Dysrhythmia    past hx pvc   Goals of care, counseling/discussion 09/25/2018   H/O autologous stem cell transplant ([place])    may 2014  at [facility]   History of Bell's palsy    2009  RIGHT SIDE--  HAS 80% FUNCTION / PT STATES A LITTLE ASYMETRICAL AND EFFECTS MOUTH   History of radiation therapy 10/18/17-11/05/17   sprine T1 26 Gy in 13 fractions, spine boost 10 Gy in 5 fractions   Hodgkin's disease, nodular sclerosis, of inguinal region/lower limb ([place]) ONOLOGIST--  DR ENNEVER AND A [facility]     SALVAGE CHEMO 2013/  AUTOLOGUS STEM CELL TRANSPLANT MAY 2014 AT [facility]   Mass of right chest wall 09/18/2018   Mass of right inguinal region    PVC (premature ventricular contraction)    "benign"   TIA (transient ischemic attack) 02/2015   "probable TIA"   Wears contact lenses     Past Surgical History:  Procedure Laterality Date   AXILLARY LYMPH NODE BIOPSY Left 02/02/2013   Procedure: NEEDLE LOCALIZED AXILLARY LYMPH NODE BIOPSY;  Surgeon: Haywood Lasso, MD;  Location: [place];  Service: General;  Laterality: Left;    BACK SURGERY  03/2021   BONE MARROW BIOPSY  08/26/2012   CYST REMOVAL NECK Left 11/26/1978   LEFT INGUINAL LYMPH NODE BX  09/09/2012   LYMPH NODE BIOPSY N/A 07/28/2015   Procedure: LYMPH NODE BIOPSY;  Surgeon: Melrose Nakayama, MD;  Location: [place];  Service: Thoracic;  Laterality: N/A;   MASS EXCISION Right 09/19/2018   Procedure: EXCISION OF RIGHT CHEST WALL MASS ERAS PATHWAY;  Surgeon: Fanny Skates, MD;  Location: [place];  Service: General;  Laterality: Right;   NODE DISSECTION Right 07/28/2015   Procedure: NODE DISSECTION;  Surgeon: Melrose Nakayama, MD;  Location: [place];  Service: Thoracic;  Laterality: Right;   PLEURA BIOPSY Left 05/31/2014   REMOVAL RIGHT INGUINAL LYMPH NODES  08/16/2011   SCROTAL EXPLORATION Right 01/04/2014   Procedure: SCROTUM EXPLORATION   INGUINAL , EXCISION OF CYSTIC MASS OF RIGHT SPERMATIC CORD, [place];  Surgeon: Franchot Gallo, MD;  Location: [facility];  Service: Urology;  Laterality: Right;   TRANSTHORACIC ECHOCARDIOGRAM  03/18/2013   MILD LVH/  EF 55-60%   VIDEO ASSISTED THORACOSCOPY Left 05/31/2014   Procedure: LEFT VIDEO ASSISTED THORACOSCOPY, PLEURAL BIOPSY;  Surgeon: Melrose Nakayama, MD;  Location: [place];  Service: Thoracic;  Laterality: Left;   VIDEO ASSISTED THORACOSCOPY Right 07/28/2015   Procedure: RIGHT VIDEO ASSISTED THORACOSCOPY;  Surgeon: Melrose Nakayama, MD;  Location: [place];  Service: Thoracic;  Laterality: Right;   VIDEO ASSISTED THORACOSCOPY (VATS)/ LYMPH NODE SAMPLING Right 07/28/2015   VIDEO ASSISTED THORACOSCOPY (VATS)/WEDGE RESECTION  05/31/2014   VIDEO BRONCHOSCOPY WITH ENDOBRONCHIAL ULTRASOUND  07/28/2015   VIDEO BRONCHOSCOPY WITH ENDOBRONCHIAL ULTRASOUND N/A 07/28/2015   Procedure: VIDEO BRONCHOSCOPY WITH ENDOBRONCHIAL ULTRASOUND;  Surgeon: Melrose Nakayama, MD;  Location: [place];  Service: Thoracic;  Laterality: N/A;    There were no vitals filed for this  visit.   Subjective Assessment - 01/16/22 1453     Subjective Feeling pretty good today and been trying to get more active    Pertinent History right shoulder injury from broken humerus and rotator cuff tear, recent hx of Hodgkins lymphoma, lumbar surgery    Patient Stated Goals Be more active, get stronger, be able to get in/out of bed easier, reduce freezing when walking    Pain Onset More than a month ago                               [facility] Adult PT Treatment/Exercise - 01/16/22 0001       Ambulation/Gait   Ambulation/Gait Yes    Gait Comments Resisted walking against therapist resistance via draw sheet 4x80 ft. Retrowalking with CGA 2x80 ft to engage hip extension and facilitate hip strategy      Neuro Re-ed    Neuro Re-ed Details  seated PWR! trunk flexion-extension 2x10. Seated rotation and reach requiring HHA 2x10. Sit to stand 5x5 reps from elevated EOM with power take-off (e.g. 5 sec count-down, sit to stand with jump at top end)      Knee/Hip Exercises: Aerobic   Nustep level 5 x 6 min 70-80 SPM                       PT Short Term Goals - 01/02/22 1424       PT SHORT TERM GOAL #1   Title Patient will be independent in static postural HEP to improve alignment and enhance stability    Time 4    Period Weeks    Status New    Target Date 01/30/22      PT SHORT TERM GOAL #2   Title Improve BLE strength to 4/5 gross strength to enhance functional activity tolerance    Baseline 3+/5 BLE    Time 4    Period Weeks    Status New    Target Date 01/30/22      PT SHORT TERM GOAL #3   Title Manifest improved BLE strength and balance as evidenced by time of 25 sec 5xSTS    Baseline 57 sec    Time 4    Period Weeks    Status New    Target Date 01/30/22      PT SHORT TERM GOAL #4   Title Demo bed mobility with supervision to reduce level of assistance from cargivers    Baseline CGA sit to supine, mod A supine to sit    Time 4    Period  Weeks    Status New    Target Date 01/30/22               PT Long Term Goals - 01/02/22 1426  PT LONG TERM GOAL #1   Title Pt will be independent with Parkinson's specific HEP for improved posture, balance, and gait.    Time 8    Period Weeks    Status New    Target Date 02/27/22      PT LONG TERM GOAL #2   Title Demo improved safety with ambulation as evidenced by time of 20 sec TUG test    Time 8    Period Weeks    Status New    Target Date 02/27/22                   Plan - 01/16/22 1536     Clinical Impression Statement Improved fluidity of movements appreciated today and able to coordinate powerful sit to stand movement with auditory countdown and implement double limb hop with ground clearance. Limited hip extension/step length with retrowalking demonstrating difficulty with symmetric step length. Difficulty with coordinating trunk/hip rotation seated EOM. Overall, very good session today with mass engagement and improved performance and reduced freezing episodes with use of audible "count-down" to initiate movement. Continued sessions to improve mobility, motor control, and develop/implement strategies to reduce freezing    Personal Factors and Comorbidities Comorbidity 3+;Time since onset of injury/illness/exacerbation    Comorbidities hx of CA, right shoulder injury, recent back surgery    Examination-Activity Limitations Bed Mobility;Carry;Lift;Toileting;Stand;Stairs;Squat;Reach Overhead;Locomotion Level;Transfers    Examination-Participation Restrictions Cleaning;Community Activity;Laundry;Occupation;Meal Prep    Stability/Clinical Decision Making Evolving/Moderate complexity    Rehab Potential Good    PT Frequency 2x / week    PT Duration 8 weeks    PT Treatment/Interventions ADLs/Self Care Home Management;Electrical Stimulation;DME Instruction;Moist Heat;Gait training;Stair training;Functional mobility training;Therapeutic activities;Therapeutic  exercise;Balance training;Neuromuscular re-education;Manual techniques;Patient/family education    PT Next Visit Plan Large amplitude movement patterns (modified seated and standing PWR! Moves-avoiding pain with R shoulder), gait training, tips to reduce freezing episodes with gait.    PT Home Exercise Plan Tall sitting 3x30 sec, scapular retraction 3x10, supine lumbar trunk rotation, seated lumbar flexion-extension PWR! move    Consulted and Agree with Plan of Care Patient    Family Member Consulted significant other             Patient will benefit from skilled therapeutic intervention in order to improve the following deficits and impairments:  Abnormal gait, Decreased activity tolerance, Decreased balance, Decreased coordination, Decreased endurance, Decreased mobility, Decreased strength, Difficulty walking, Postural dysfunction, Impaired perceived functional ability, Pain, Improper body mechanics  Visit Diagnosis: Other abnormalities of gait and mobility  Unsteadiness on feet  Difficulty in walking, not elsewhere classified  Muscle weakness (generalized)     Problem List Patient Active Problem List   Diagnosis Date Noted   Closed fracture of right proximal humerus 10/03/2021   Traumatic complete tear of right rotator cuff 10/03/2021   Lumbar radiculopathy 10/03/2021   Goals of care, counseling/discussion 09/25/2018   Mass of right chest wall 09/18/2018   PD (Parkinson's disease) ([place]) 11/27/2016   Lymphadenopathy 07/28/2015   Mediastinal adenopathy 07/14/2015   Aphasia 03/04/2015   Slurred speech 03/04/2015   Pleural mass 05/31/2014   Abdominal pain, epigastric 02/02/2014   Weight loss 08/12/2012   H/O Bell's palsy 08/12/2012   Hodgkin's disease 08/09/2011    Toniann Fail, PT 01/16/2022, 3:40 PM  [facility] [address] [place], [place], [address] Phone: [phone]   Fax:  [phone]  Name: Brian [name] MRN: [id number] Date of Birth: 01/28/1961

## 2022-01-18 ENCOUNTER — Other Ambulatory Visit: Payer: Self-pay | Admitting: *Deleted

## 2022-01-18 ENCOUNTER — Other Ambulatory Visit: Payer: Self-pay

## 2022-01-18 ENCOUNTER — Telehealth: Payer: Self-pay | Admitting: *Deleted

## 2022-01-18 ENCOUNTER — Ambulatory Visit: Payer: Commercial Managed Care - PPO

## 2022-01-18 DIAGNOSIS — R262 Difficulty in walking, not elsewhere classified: Secondary | ICD-10-CM

## 2022-01-18 DIAGNOSIS — M6281 Muscle weakness (generalized): Secondary | ICD-10-CM

## 2022-01-18 DIAGNOSIS — R2681 Unsteadiness on feet: Secondary | ICD-10-CM

## 2022-01-18 DIAGNOSIS — R2689 Other abnormalities of gait and mobility: Secondary | ICD-10-CM

## 2022-01-18 MED ORDER — OLMESARTAN MEDOXOMIL 20 MG PO TABS: 20.0000 mg | ORAL_TABLET | Freq: Every day | ORAL | 3 refills | Status: DC

## 2022-01-18 NOTE — Telephone Encounter (Signed)
Message received from patient requesting a refill of Benicar sent in at 20 mg to the [facility] on [address] d/t dose has been decreased per recommendation of his PCP. Dr. Marin Olp notified and refill sent per pt.'s request.

## 2022-01-18 NOTE — Therapy (Signed)
[facility] [place], [place], [place], [address] Phone: [phone]   Fax:  [phone]  Physical Therapy Treatment  Patient Details  Name: Brian [name] MRN: [id number] Date of Birth: 1961/06/20 Referring Provider (PT): Tat, Eustace Quail, DO   Encounter Date: 01/18/2022   PT End of Session - 01/18/22 1450     Visit Number 5    Number of Visits 16    Date for PT Re-Evaluation 02/27/22    Authorization Type UMR, no VL, medicial necessity review after 25th visit    Authorization - Visit Number 5    Authorization - Number of Visits 24    PT Start Time [id number]    PT Stop Time 1530    PT Time Calculation (min) 41 min    Activity Tolerance Patient tolerated treatment well    Behavior During Therapy Restless;WFL for tasks assessed/performed             Past Medical History:  Diagnosis Date   Anxiety    Dysrhythmia    past hx pvc   Goals of care, counseling/discussion 09/25/2018   H/O autologous stem cell transplant ([place])    may 2014  at [facility]   History of Bell's palsy    2009  RIGHT SIDE--  HAS 80% FUNCTION / PT STATES A LITTLE ASYMETRICAL AND EFFECTS MOUTH   History of radiation therapy 10/18/17-11/05/17   sprine T1 26 Gy in 13 fractions, spine boost 10 Gy in 5 fractions   Hodgkin's disease, nodular sclerosis, of inguinal region/lower limb ([place]) ONOLOGIST--  DR ENNEVER AND A [facility]     SALVAGE CHEMO 2013/  AUTOLOGUS STEM CELL TRANSPLANT MAY 2014 AT [facility]   Mass of right chest wall 09/18/2018   Mass of right inguinal region    PVC (premature ventricular contraction)    "benign"   TIA (transient ischemic attack) 02/2015   "probable TIA"   Wears contact lenses     Past Surgical History:  Procedure Laterality Date   AXILLARY LYMPH NODE BIOPSY Left 02/02/2013   Procedure: NEEDLE LOCALIZED AXILLARY LYMPH NODE BIOPSY;  Surgeon: Haywood Lasso, MD;  Location: [place];  Service: General;  Laterality: Left;    BACK SURGERY  03/2021   BONE MARROW BIOPSY  08/26/2012   CYST REMOVAL NECK Left 11/26/1978   LEFT INGUINAL LYMPH NODE BX  09/09/2012   LYMPH NODE BIOPSY N/A 07/28/2015   Procedure: LYMPH NODE BIOPSY;  Surgeon: Melrose Nakayama, MD;  Location: [place];  Service: Thoracic;  Laterality: N/A;   MASS EXCISION Right 09/19/2018   Procedure: EXCISION OF RIGHT CHEST WALL MASS ERAS PATHWAY;  Surgeon: Fanny Skates, MD;  Location: [place];  Service: General;  Laterality: Right;   NODE DISSECTION Right 07/28/2015   Procedure: NODE DISSECTION;  Surgeon: Melrose Nakayama, MD;  Location: [place];  Service: Thoracic;  Laterality: Right;   PLEURA BIOPSY Left 05/31/2014   REMOVAL RIGHT INGUINAL LYMPH NODES  08/16/2011   SCROTAL EXPLORATION Right 01/04/2014   Procedure: SCROTUM EXPLORATION   INGUINAL , EXCISION OF CYSTIC MASS OF RIGHT SPERMATIC CORD, [place];  Surgeon: Franchot Gallo, MD;  Location: [facility];  Service: Urology;  Laterality: Right;   TRANSTHORACIC ECHOCARDIOGRAM  03/18/2013   MILD LVH/  EF 55-60%   VIDEO ASSISTED THORACOSCOPY Left 05/31/2014   Procedure: LEFT VIDEO ASSISTED THORACOSCOPY, PLEURAL BIOPSY;  Surgeon: Melrose Nakayama, MD;  Location: [place];  Service: Thoracic;  Laterality: Left;   VIDEO ASSISTED THORACOSCOPY Right 07/28/2015   Procedure: RIGHT VIDEO ASSISTED THORACOSCOPY;  Surgeon: Melrose Nakayama, MD;  Location: [place];  Service: Thoracic;  Laterality: Right;   VIDEO ASSISTED THORACOSCOPY (VATS)/ LYMPH NODE SAMPLING Right 07/28/2015   VIDEO ASSISTED THORACOSCOPY (VATS)/WEDGE RESECTION  05/31/2014   VIDEO BRONCHOSCOPY WITH ENDOBRONCHIAL ULTRASOUND  07/28/2015   VIDEO BRONCHOSCOPY WITH ENDOBRONCHIAL ULTRASOUND N/A 07/28/2015   Procedure: VIDEO BRONCHOSCOPY WITH ENDOBRONCHIAL ULTRASOUND;  Surgeon: Melrose Nakayama, MD;  Location: [place];  Service: Thoracic;  Laterality: N/A;    There were no vitals filed for this  visit.   Subjective Assessment - 01/18/22 1452     Subjective Difficult time sleeping last night feeling restless and now sleepy/fatigued today    Pertinent History right shoulder injury from broken humerus and rotator cuff tear, recent hx of Hodgkins lymphoma, lumbar surgery    Patient Stated Goals Be more active, get stronger, be able to get in/out of bed easier, reduce freezing when walking    Currently in Pain? No/denies    Pain Score 0-No pain    Pain Onset More than a month ago                               [facility] Adult PT Treatment/Exercise - 01/18/22 0001       Ambulation/Gait   Ambulation/Gait Yes    Gait Comments fast walking and negotiating 180 degree turns 5x20 ft to reduce freezing of gait. Monster walk 4x20 ft for wide stance/large amplitude      Knee/Hip Exercises: Aerobic   Nustep level 6 x 5 min goal of 80 SPM      Knee/Hip Exercises: Standing   Wall Squat 3 sets;10 reps    Wall Squat Limitations w/ swiss ball behind back    Other Standing Knee Exercises 3x5 reps forward jump/hop over lines on floor for power      Knee/Hip Exercises: Seated   Long Arc Quad Strengthening;Both;3 sets;10 reps    Long Arc Quad Weight 3 lbs.    Other Seated Knee/Hip Exercises trunk rotations 3x10 3 lbs med ball. 3x10 swiss ball roll out for trunk/shoulder flexion. Postural stretch by therapist while performing 3x10 reps seated march 3 lbs                       PT Short Term Goals - 01/02/22 1424       PT SHORT TERM GOAL #1   Title Patient will be independent in static postural HEP to improve alignment and enhance stability    Time 4    Period Weeks    Status New    Target Date 01/30/22      PT SHORT TERM GOAL #2   Title Improve BLE strength to 4/5 gross strength to enhance functional activity tolerance    Baseline 3+/5 BLE    Time 4    Period Weeks    Status New    Target Date 01/30/22      PT SHORT TERM GOAL #3   Title Manifest  improved BLE strength and balance as evidenced by time of 25 sec 5xSTS    Baseline 57 sec    Time 4    Period Weeks    Status New    Target Date 01/30/22      PT SHORT TERM GOAL #4   Title Demo bed mobility with  supervision to reduce level of assistance from cargivers    Baseline CGA sit to supine, mod A supine to sit    Time 4    Period Weeks    Status New    Target Date 01/30/22               PT Long Term Goals - 01/02/22 1426       PT LONG TERM GOAL #1   Title Pt will be independent with Parkinson's specific HEP for improved posture, balance, and gait.    Time 8    Period Weeks    Status New    Target Date 02/27/22      PT LONG TERM GOAL #2   Title Demo improved safety with ambulation as evidenced by time of 20 sec TUG test    Time 8    Period Weeks    Status New    Target Date 02/27/22                   Plan - 01/18/22 1654     Clinical Impression Statement Demonstrating improved coordination and fluidity of movement with initial gait presentation of decreased left foot clearance and short, shuffled steps. Improved coordination and gait station following preliminary activities with emphasis on rapid, alternating movements with large amplitude.  Able to achieve double limb hop with 2 ft clearance and good mechanics on landing with activity to improve muscular power and tolerated quite well. Requiring cues for large amplitude movements and facilitating the initiation of these. Need to develop strategies and routine to reinforce these principles    Personal Factors and Comorbidities Comorbidity 3+;Time since onset of injury/illness/exacerbation    Comorbidities hx of CA, right shoulder injury, recent back surgery    Examination-Activity Limitations Bed Mobility;Carry;Lift;Toileting;Stand;Stairs;Squat;Reach Overhead;Locomotion Level;Transfers    Examination-Participation Restrictions Cleaning;Community Activity;Laundry;Occupation;Meal Prep     Stability/Clinical Decision Making Evolving/Moderate complexity    Rehab Potential Good    PT Frequency 2x / week    PT Duration 8 weeks    PT Treatment/Interventions ADLs/Self Care Home Management;Electrical Stimulation;DME Instruction;Moist Heat;Gait training;Stair training;Functional mobility training;Therapeutic activities;Therapeutic exercise;Balance training;Neuromuscular re-education;Manual techniques;Patient/family education    PT Next Visit Plan Large amplitude movement patterns (modified seated and standing PWR! Moves-avoiding pain with R shoulder), gait training, tips to reduce freezing episodes with gait.    PT Home Exercise Plan Tall sitting 3x30 sec, scapular retraction 3x10, supine lumbar trunk rotation, seated lumbar flexion-extension PWR! move    Consulted and Agree with Plan of Care Patient    Family Member Consulted significant other             Patient will benefit from skilled therapeutic intervention in order to improve the following deficits and impairments:  Abnormal gait, Decreased activity tolerance, Decreased balance, Decreased coordination, Decreased endurance, Decreased mobility, Decreased strength, Difficulty walking, Postural dysfunction, Impaired perceived functional ability, Pain, Improper body mechanics  Visit Diagnosis: Other abnormalities of gait and mobility  Unsteadiness on feet  Difficulty in walking, not elsewhere classified  Muscle weakness (generalized)     Problem List Patient Active Problem List   Diagnosis Date Noted   Closed fracture of right proximal humerus 10/03/2021   Traumatic complete tear of right rotator cuff 10/03/2021   Lumbar radiculopathy 10/03/2021   Goals of care, counseling/discussion 09/25/2018   Mass of right chest wall 09/18/2018   PD (Parkinson's disease) ([place]) 11/27/2016   Lymphadenopathy 07/28/2015   Mediastinal adenopathy 07/14/2015   Aphasia 03/04/2015   Slurred speech 03/04/2015  Pleural mass 05/31/2014    Abdominal pain, epigastric 02/02/2014   Weight loss 08/12/2012   H/O Bell's palsy 08/12/2012   Hodgkin's disease 08/09/2011    Toniann Fail, PT 01/18/2022, 4:58 PM  [facility] [address], [place], [place], [address] Phone: [phone]   Fax:  [phone]  Name: RJ PEDROSA MRN: [id number] Date of Birth: May 14, 1961

## 2022-01-23 ENCOUNTER — Other Ambulatory Visit: Payer: Self-pay

## 2022-01-23 ENCOUNTER — Ambulatory Visit: Payer: Commercial Managed Care - PPO

## 2022-01-23 DIAGNOSIS — R262 Difficulty in walking, not elsewhere classified: Secondary | ICD-10-CM | POA: Diagnosis not present

## 2022-01-23 DIAGNOSIS — R2689 Other abnormalities of gait and mobility: Secondary | ICD-10-CM

## 2022-01-23 DIAGNOSIS — R2681 Unsteadiness on feet: Secondary | ICD-10-CM

## 2022-01-23 DIAGNOSIS — M6281 Muscle weakness (generalized): Secondary | ICD-10-CM

## 2022-01-23 NOTE — Therapy (Signed)
[facility] [place], [place], [place], [address] Phone: [phone]   Fax:  [phone]  Physical Therapy Treatment  Patient Details  Name: Brian [name] MRN: [id number] Date of Birth: August 20, 1961 Referring Provider (PT): Tat, Eustace Quail, DO   Encounter Date: 01/23/2022   PT End of Session - 01/23/22 1418     Visit Number 6    Number of Visits 16    Date for PT Re-Evaluation 02/27/22    Authorization Type UMR, no VL, medicial necessity review after 25th visit    Authorization - Visit Number 6    Authorization - Number of Visits 24    PT Start Time 6546    PT Stop Time 1445    PT Time Calculation (min) 42 min    Activity Tolerance Patient tolerated treatment well    Behavior During Therapy Restless;WFL for tasks assessed/performed             Past Medical History:  Diagnosis Date   Anxiety    Dysrhythmia    past hx pvc   Goals of care, counseling/discussion 09/25/2018   H/O autologous stem cell transplant ([place])    may 2014  at [place]   History of Bell's palsy    2009  RIGHT SIDE--  HAS 80% FUNCTION / PT STATES A LITTLE ASYMETRICAL AND EFFECTS MOUTH   History of radiation therapy 10/18/17-11/05/17   sprine T1 26 Gy in 13 fractions, spine boost 10 Gy in 5 fractions   Hodgkin's disease, nodular sclerosis, of inguinal region/lower limb ([place]) ONOLOGIST--  DR ENNEVER AND A [facility]     SALVAGE CHEMO 2013/  AUTOLOGUS STEM CELL TRANSPLANT MAY 2014 AT [facility]   Mass of right chest wall 09/18/2018   Mass of right inguinal region    PVC (premature ventricular contraction)    "benign"   TIA (transient ischemic attack) 02/2015   "probable TIA"   Wears contact lenses     Past Surgical History:  Procedure Laterality Date   AXILLARY LYMPH NODE BIOPSY Left 02/02/2013   Procedure: NEEDLE LOCALIZED AXILLARY LYMPH NODE BIOPSY;  Surgeon: Haywood Lasso, MD;  Location: [facility];  Service: General;  Laterality: Left;    BACK SURGERY  03/2021   BONE MARROW BIOPSY  08/26/2012   CYST REMOVAL NECK Left 11/26/1978   LEFT INGUINAL LYMPH NODE BX  09/09/2012   LYMPH NODE BIOPSY N/A 07/28/2015   Procedure: LYMPH NODE BIOPSY;  Surgeon: Melrose Nakayama, MD;  Location: [facility];  Service: Thoracic;  Laterality: N/A;   MASS EXCISION Right 09/19/2018   Procedure: EXCISION OF RIGHT CHEST WALL MASS ERAS PATHWAY;  Surgeon: Fanny Skates, MD;  Location: [facility];  Service: General;  Laterality: Right;   NODE DISSECTION Right 07/28/2015   Procedure: NODE DISSECTION;  Surgeon: Melrose Nakayama, MD;  Location: [facility];  Service: Thoracic;  Laterality: Right;   PLEURA BIOPSY Left 05/31/2014   REMOVAL RIGHT INGUINAL LYMPH NODES  08/16/2011   SCROTAL EXPLORATION Right 01/04/2014   Procedure: SCROTUM EXPLORATION   INGUINAL , EXCISION OF CYSTIC MASS OF RIGHT SPERMATIC CORD, [place];  Surgeon: Franchot Gallo, MD;  Location: [facility];  Service: Urology;  Laterality: Right;   TRANSTHORACIC ECHOCARDIOGRAM  03/18/2013   MILD LVH/  EF 55-60%   VIDEO ASSISTED THORACOSCOPY Left 05/31/2014   Procedure: LEFT VIDEO ASSISTED THORACOSCOPY, PLEURAL BIOPSY;  Surgeon: Melrose Nakayama, MD;  Location: [facility];  Service: Thoracic;  Laterality: Left;   VIDEO ASSISTED THORACOSCOPY Right 07/28/2015   Procedure: RIGHT VIDEO ASSISTED THORACOSCOPY;  Surgeon: Melrose Nakayama, MD;  Location: [facility];  Service: Thoracic;  Laterality: Right;   VIDEO ASSISTED THORACOSCOPY (VATS)/ LYMPH NODE SAMPLING Right 07/28/2015   VIDEO ASSISTED THORACOSCOPY (VATS)/WEDGE RESECTION  05/31/2014   VIDEO BRONCHOSCOPY WITH ENDOBRONCHIAL ULTRASOUND  07/28/2015   VIDEO BRONCHOSCOPY WITH ENDOBRONCHIAL ULTRASOUND N/A 07/28/2015   Procedure: VIDEO BRONCHOSCOPY WITH ENDOBRONCHIAL ULTRASOUND;  Surgeon: Melrose Nakayama, MD;  Location: [place];  Service: Thoracic;  Laterality: N/A;    There were no vitals filed for this  visit.   Subjective Assessment - 01/23/22 1405     Subjective Right posterior thigh pain in the hamstrings after last session and still present with certain movements    Pertinent History right shoulder injury from broken humerus and rotator cuff tear, recent hx of Hodgkins lymphoma, lumbar surgery    Patient Stated Goals Be more active, get stronger, be able to get in/out of bed easier, reduce freezing when walking    Currently in Pain? Yes    Pain Onset More than a month ago                               [facility] Adult PT Treatment/Exercise - 01/23/22 0001       Ambulation/Gait   Ambulation/Gait Yes    Gait Comments agility ladder drills performing large every other steps, large lateral steps, monster walk over ladder 6 trips. Fast feet in/out of agility ladder to improve speed/coordination      Knee/Hip Exercises: Stretches   Passive Hamstring Stretch Right;1 rep;60 seconds    Piriformis Stretch Right;1 rep;60 seconds      Knee/Hip Exercises: Aerobic   Nustep level 6 x 5 min goal of 80 SPM      Knee/Hip Exercises: Supine   Bridges Strengthening;Both;2 sets;10 reps      Knee/Hip Exercises: Prone   Other Prone Exercises quadruped rocking 2x10. Thoracic stretch/rotation 1x5. Tall kneeling hip hinge 2x10. Prone on elbows with breathing x 2 min                       PT Short Term Goals - 01/02/22 1424       PT SHORT TERM GOAL #1   Title Patient will be independent in static postural HEP to improve alignment and enhance stability    Time 4    Period Weeks    Status New    Target Date 01/30/22      PT SHORT TERM GOAL #2   Title Improve BLE strength to 4/5 gross strength to enhance functional activity tolerance    Baseline 3+/5 BLE    Time 4    Period Weeks    Status New    Target Date 01/30/22      PT SHORT TERM GOAL #3   Title Manifest improved BLE strength and balance as evidenced by time of 25 sec 5xSTS    Baseline 57 sec    Time 4     Period Weeks    Status New    Target Date 01/30/22      PT SHORT TERM GOAL #4   Title Demo bed mobility with supervision to reduce level of assistance from cargivers    Baseline CGA sit to supine, mod A supine to sit    Time 4    Period  Weeks    Status New    Target Date 01/30/22               PT Long Term Goals - 01/02/22 1426       PT LONG TERM GOAL #1   Title Pt will be independent with Parkinson's specific HEP for improved posture, balance, and gait.    Time 8    Period Weeks    Status New    Target Date 02/27/22      PT LONG TERM GOAL #2   Title Demo improved safety with ambulation as evidenced by time of 20 sec TUG test    Time 8    Period Weeks    Status New    Target Date 02/27/22                   Plan - 01/23/22 1534     Clinical Impression Statement Resisted hamstring isometrics did not reproduce posterior thigh pain on RLE nor did passive stretching, unsure as to posterior thigh discomfort but this did not limit his activity participation. Good performance of activities today with performance of quadruped activities and progressing with higher level coordination drills with not freezing episodes witnessed today and pt able to perform bed mobility with independence. Continued PT sessions indicated to progress balance and mobility to enhance gait abilities and reduce risk for falls    Personal Factors and Comorbidities Comorbidity 3+;Time since onset of injury/illness/exacerbation    Comorbidities hx of CA, right shoulder injury, recent back surgery    Examination-Activity Limitations Bed Mobility;Carry;Lift;Toileting;Stand;Stairs;Squat;Reach Overhead;Locomotion Level;Transfers    Examination-Participation Restrictions Cleaning;Community Activity;Laundry;Occupation;Meal Prep    Stability/Clinical Decision Making Evolving/Moderate complexity    Rehab Potential Good    PT Frequency 2x / week    PT Duration 8 weeks    PT Treatment/Interventions  ADLs/Self Care Home Management;Electrical Stimulation;DME Instruction;Moist Heat;Gait training;Stair training;Functional mobility training;Therapeutic activities;Therapeutic exercise;Balance training;Neuromuscular re-education;Manual techniques;Patient/family education    PT Next Visit Plan Large amplitude movement patterns (modified seated and standing PWR! Moves-avoiding pain with R shoulder), gait training, tips to reduce freezing episodes with gait.    PT Home Exercise Plan Tall sitting 3x30 sec, scapular retraction 3x10, supine lumbar trunk rotation, seated lumbar flexion-extension PWR! move    Consulted and Agree with Plan of Care Patient    Family Member Consulted significant other             Patient will benefit from skilled therapeutic intervention in order to improve the following deficits and impairments:  Abnormal gait, Decreased activity tolerance, Decreased balance, Decreased coordination, Decreased endurance, Decreased mobility, Decreased strength, Difficulty walking, Postural dysfunction, Impaired perceived functional ability, Pain, Improper body mechanics  Visit Diagnosis: Other abnormalities of gait and mobility  Unsteadiness on feet  Difficulty in walking, not elsewhere classified  Muscle weakness (generalized)     Problem List Patient Active Problem List   Diagnosis Date Noted   Closed fracture of right proximal humerus 10/03/2021   Traumatic complete tear of right rotator cuff 10/03/2021   Lumbar radiculopathy 10/03/2021   Goals of care, counseling/discussion 09/25/2018   Mass of right chest wall 09/18/2018   PD (Parkinson's disease) ([place]) 11/27/2016   Lymphadenopathy 07/28/2015   Mediastinal adenopathy 07/14/2015   Aphasia 03/04/2015   Slurred speech 03/04/2015   Pleural mass 05/31/2014   Abdominal pain, epigastric 02/02/2014   Weight loss 08/12/2012   H/O Bell's palsy 08/12/2012   Hodgkin's disease 08/09/2011    Toniann Fail, PT 01/23/2022, 3:51  PM  [facility] [address], [place], [place], [address] Phone: [phone]   Fax:  [phone]  Name: MASSIE MEES MRN: [id number] Date of Birth: Feb 26, 1961

## 2022-01-25 ENCOUNTER — Ambulatory Visit: Payer: Commercial Managed Care - PPO | Attending: Neurology

## 2022-01-25 ENCOUNTER — Other Ambulatory Visit: Payer: Self-pay

## 2022-01-25 DIAGNOSIS — R262 Difficulty in walking, not elsewhere classified: Secondary | ICD-10-CM | POA: Diagnosis present

## 2022-01-25 DIAGNOSIS — R2681 Unsteadiness on feet: Secondary | ICD-10-CM | POA: Insufficient documentation

## 2022-01-25 DIAGNOSIS — M6281 Muscle weakness (generalized): Secondary | ICD-10-CM | POA: Insufficient documentation

## 2022-01-25 DIAGNOSIS — R2689 Other abnormalities of gait and mobility: Secondary | ICD-10-CM | POA: Insufficient documentation

## 2022-01-25 NOTE — Therapy (Signed)
[facility], [place], [place], [address] Phone: [phone]   Fax:  [phone]  Physical Therapy Treatment  Patient Details  Name: Brian [name] MRN: [id number] Date of Birth: [birth year] Referring Provider (PT): Tat, Eustace Quail, DO   Encounter Date: 01/25/2022   PT End of Session - 01/25/22 1244     Visit Number 7    Number of Visits 16    Date for PT Re-Evaluation 02/27/22    Authorization Type UMR, no VL, medicial necessity review after 25th visit    Authorization - Visit Number 7    Authorization - Number of Visits 24    PT Start Time 1146    PT Stop Time 1231    PT Time Calculation (min) 45 min    Activity Tolerance Patient tolerated treatment well    Behavior During Therapy Restless;WFL for tasks assessed/performed             Past Medical History:  Diagnosis Date   Anxiety    Dysrhythmia    past hx pvc   Goals of care, counseling/discussion 09/25/2018   H/O autologous stem cell transplant ([place])    may 2014  at [place]   History of Bell's palsy    2009  RIGHT SIDE--  HAS 80% FUNCTION / PT STATES A LITTLE ASYMETRICAL AND EFFECTS MOUTH   History of radiation therapy 10/18/17-11/05/17   sprine T1 26 Gy in 13 fractions, spine boost 10 Gy in 5 fractions   Hodgkin's disease, nodular sclerosis, of inguinal region/lower limb ([place]) ONOLOGIST--  DR ENNEVER AND A [facility]     SALVAGE CHEMO 2013/  AUTOLOGUS STEM CELL TRANSPLANT MAY 2014 AT [facility]   Mass of right chest wall 09/18/2018   Mass of right inguinal region    PVC (premature ventricular contraction)    "benign"   TIA (transient ischemic attack) 02/2015   "probable TIA"   Wears contact lenses     Past Surgical History:  Procedure Laterality Date   AXILLARY LYMPH NODE BIOPSY Left 02/02/2013   Procedure: NEEDLE LOCALIZED AXILLARY LYMPH NODE BIOPSY;  Surgeon: Haywood Lasso, MD;  Location: [place];  Service: General;  Laterality: Left;    BACK SURGERY  03/2021   BONE MARROW BIOPSY  08/26/2012   CYST REMOVAL NECK Left 11/26/1978   LEFT INGUINAL LYMPH NODE BX  09/09/2012   LYMPH NODE BIOPSY N/A 07/28/2015   Procedure: LYMPH NODE BIOPSY;  Surgeon: Melrose Nakayama, MD;  Location: [facility];  Service: Thoracic;  Laterality: N/A;   MASS EXCISION Right 09/19/2018   Procedure: EXCISION OF RIGHT CHEST WALL MASS ERAS PATHWAY;  Surgeon: Fanny Skates, MD;  Location: [facility];  Service: General;  Laterality: Right;   NODE DISSECTION Right 07/28/2015   Procedure: NODE DISSECTION;  Surgeon: Melrose Nakayama, MD;  Location: [facility];  Service: Thoracic;  Laterality: Right;   PLEURA BIOPSY Left 05/31/2014   REMOVAL RIGHT INGUINAL LYMPH NODES  08/16/2011   SCROTAL EXPLORATION Right 01/04/2014   Procedure: SCROTUM EXPLORATION   INGUINAL , EXCISION OF CYSTIC MASS OF RIGHT SPERMATIC CORD, [place];  Surgeon: Franchot Gallo, MD;  Location[facility];  Service: Urology;  Laterality: Right;   TRANSTHORACIC ECHOCARDIOGRAM  03/18/2013   MILD LVH/  EF 55-60%   VIDEO ASSISTED THORACOSCOPY Left 05/31/2014   Procedure: LEFT VIDEO ASSISTED THORACOSCOPY, PLEURAL BIOPSY;  Surgeon: Melrose Nakayama, MD;  Location: [place];  Service: Thoracic;  Laterality: Left;   VIDEO ASSISTED THORACOSCOPY Right 07/28/2015   Procedure: RIGHT VIDEO ASSISTED THORACOSCOPY;  Surgeon: Melrose Nakayama, MD;  Location: [place];  Service: Thoracic;  Laterality: Right;   VIDEO ASSISTED THORACOSCOPY (VATS)/ LYMPH NODE SAMPLING Right 07/28/2015   VIDEO ASSISTED THORACOSCOPY (VATS)/WEDGE RESECTION  05/31/2014   VIDEO BRONCHOSCOPY WITH ENDOBRONCHIAL ULTRASOUND  07/28/2015   VIDEO BRONCHOSCOPY WITH ENDOBRONCHIAL ULTRASOUND N/A 07/28/2015   Procedure: VIDEO BRONCHOSCOPY WITH ENDOBRONCHIAL ULTRASOUND;  Surgeon: Melrose Nakayama, MD;  Location: [place];  Service: Thoracic;  Laterality: N/A;    There were no vitals filed for this  visit.   Subjective Assessment - 01/25/22 1155     Subjective Continuing to have right posterior thigh pain, it is transient and mainly comes on when transitioning sit<>stand    Pertinent History right shoulder injury from broken humerus and rotator cuff tear, recent hx of Hodgkins lymphoma, lumbar surgery    Patient Stated Goals Be more active, get stronger, be able to get in/out of bed easier, reduce freezing when walking    Currently in Pain? Yes    Pain Score 7     Pain Location Hip    Pain Orientation Posterior;Right    Pain Type Acute pain    Pain Onset More than a month ago                               [facility] Adult PT Treatment/Exercise - 01/25/22 0001       Exercises   Exercises Lumbar      Lumbar Exercises: Stretches   Passive Hamstring Stretch Right;Left;3 reps;60 seconds    Single Knee to Chest Stretch Right;Left;3 reps;60 seconds    Double Knee to Chest Stretch 4 reps;60 seconds    Lower Trunk Rotation 2 reps;60 seconds    Quadruped Mid Back Stretch 3 reps;60 seconds    Quad Stretch 2 reps;60 seconds    Piriformis Stretch 2 reps;60 seconds    Figure 4 Stretch 2 reps;60 seconds;Supine;With overpressure      Knee/Hip Exercises: Prone   Hamstring Curl 3 sets;10 reps                     PT Education - 01/25/22 1244     Education Details education on use of Mckenzie lumbar roll for sitting posture and use of infrared heating pad for pain control    Person(s) Educated Patient    Methods Explanation;Handout    Comprehension Verbalized understanding              PT Short Term Goals - 01/02/22 1424       PT SHORT TERM GOAL #1   Title Patient will be independent in static postural HEP to improve alignment and enhance stability    Time 4    Period Weeks    Status New    Target Date 01/30/22      PT SHORT TERM GOAL #2   Title Improve BLE strength to 4/5 gross strength to enhance functional activity tolerance    Baseline 3+/5  BLE    Time 4    Period Weeks    Status New    Target Date 01/30/22      PT SHORT TERM GOAL #3   Title Manifest improved BLE strength and balance as evidenced by time of 25 sec 5xSTS    Baseline 57 sec    Time 4  Period Weeks    Status New    Target Date 01/30/22      PT SHORT TERM GOAL #4   Title Demo bed mobility with supervision to reduce level of assistance from cargivers    Baseline CGA sit to supine, mod A supine to sit    Time 4    Period Weeks    Status New    Target Date 01/30/22               PT Long Term Goals - 01/02/22 1426       PT LONG TERM GOAL #1   Title Pt will be independent with Parkinson's specific HEP for improved posture, balance, and gait.    Time 8    Period Weeks    Status New    Target Date 02/27/22      PT LONG TERM GOAL #2   Title Demo improved safety with ambulation as evidenced by time of 20 sec TUG test    Time 8    Period Weeks    Status New    Target Date 02/27/22                   Plan - 01/25/22 1245     Clinical Impression Statement Continues to experience posterior right buttock/thigh pain mainly when transitioning sit to stand and stand-sit.  Hamstring and glute resisted tests/isometrics do not reproduce symptoms, no pain with Scour's maneuver, hip ROM WNL and no reproduction of symptoms. Increased discomfort with L-spine PA pressure/PIVM. Tolerated stretching activities well with decrease in buttock/thigh pain to 3/10 with transfers. Continued sessions indicated to proceed for coordination, gait, balance activities to minimize sequelae of PD    Personal Factors and Comorbidities Comorbidity 3+;Time since onset of injury/illness/exacerbation    Comorbidities hx of CA, right shoulder injury, recent back surgery    Examination-Activity Limitations Bed Mobility;Carry;Lift;Toileting;Stand;Stairs;Squat;Reach Overhead;Locomotion Level;Transfers    Examination-Participation Restrictions Cleaning;Community  Activity;Laundry;Occupation;Meal Prep    Stability/Clinical Decision Making Evolving/Moderate complexity    Rehab Potential Good    PT Frequency 2x / week    PT Duration 8 weeks    PT Treatment/Interventions ADLs/Self Care Home Management;Electrical Stimulation;DME Instruction;Moist Heat;Gait training;Stair training;Functional mobility training;Therapeutic activities;Therapeutic exercise;Balance training;Neuromuscular re-education;Manual techniques;Patient/family education    PT Next Visit Plan Large amplitude movement patterns (modified seated and standing PWR! Moves-avoiding pain with R shoulder), gait training, tips to reduce freezing episodes with gait.    PT Home Exercise Plan Tall sitting 3x30 sec, scapular retraction 3x10, supine lumbar trunk rotation, seated lumbar flexion-extension PWR! move    Consulted and Agree with Plan of Care Patient    Family Member Consulted significant other             Patient will benefit from skilled therapeutic intervention in order to improve the following deficits and impairments:  Abnormal gait, Decreased activity tolerance, Decreased balance, Decreased coordination, Decreased endurance, Decreased mobility, Decreased strength, Difficulty walking, Postural dysfunction, Impaired perceived functional ability, Pain, Improper body mechanics  Visit Diagnosis: Other abnormalities of gait and mobility  Unsteadiness on feet  Difficulty in walking, not elsewhere classified  Muscle weakness (generalized)     Problem List Patient Active Problem List   Diagnosis Date Noted   Closed fracture of right proximal humerus 10/03/2021   Traumatic complete tear of right rotator cuff 10/03/2021   Lumbar radiculopathy 10/03/2021   Goals of care, counseling/discussion 09/25/2018   Mass of right chest wall 09/18/2018   PD (Parkinson's disease) ([place]) 11/27/2016  Lymphadenopathy 07/28/2015   Mediastinal adenopathy 07/14/2015   Aphasia 03/04/2015   Slurred  speech 03/04/2015   Pleural mass 05/31/2014   Abdominal pain, epigastric 02/02/2014   Weight loss 08/12/2012   H/O Bell's palsy 08/12/2012   Hodgkin's disease 08/09/2011    Toniann Fail, PT 01/25/2022, 12:48 PM  [facility] [address] [place], [place], [address] Phone: [phone]   Fax:  [phone]  Name: BAYNE FOSNAUGH MRN: [id number] Date of Birth: May 21, 1961

## 2022-01-30 ENCOUNTER — Ambulatory Visit: Payer: Commercial Managed Care - PPO

## 2022-02-01 ENCOUNTER — Ambulatory Visit: Payer: Commercial Managed Care - PPO

## 2022-02-01 ENCOUNTER — Other Ambulatory Visit: Payer: Self-pay

## 2022-02-01 DIAGNOSIS — R2689 Other abnormalities of gait and mobility: Secondary | ICD-10-CM | POA: Diagnosis not present

## 2022-02-01 DIAGNOSIS — R2681 Unsteadiness on feet: Secondary | ICD-10-CM

## 2022-02-01 DIAGNOSIS — R262 Difficulty in walking, not elsewhere classified: Secondary | ICD-10-CM

## 2022-02-01 DIAGNOSIS — M6281 Muscle weakness (generalized): Secondary | ICD-10-CM

## 2022-02-01 NOTE — Therapy (Signed)
[facility] [address], [place], [place], [address] Phone: [phone]   Fax:  [phone]  Physical Therapy Treatment  Patient Details  Name: Brian [name] MRN: [id number] Date of Birth: 1961-05-03 Referring Provider (PT): Tat, Eustace Quail, DO   Encounter Date: 02/01/2022   PT End of Session - 02/01/22 1158     Visit Number 8    Number of Visits 16    Date for PT Re-Evaluation 02/27/22    Authorization Type UMR, no VL, medicial necessity review after 25th visit    Authorization - Visit Number 8    Authorization - Number of Visits 24    PT Start Time 1150    PT Stop Time 0947    PT Time Calculation (min) 45 min    Activity Tolerance Patient tolerated treatment well    Behavior During Therapy Restless;WFL for tasks assessed/performed             Past Medical History:  Diagnosis Date   Anxiety    Dysrhythmia    past hx pvc   Goals of care, counseling/discussion 09/25/2018   H/O autologous stem cell transplant ([place])    may 2014  at [place]   History of Bell's palsy    2009  RIGHT SIDE--  HAS 80% FUNCTION / PT STATES A LITTLE ASYMETRICAL AND EFFECTS MOUTH   History of radiation therapy 10/18/17-11/05/17   sprine T1 26 Gy in 13 fractions, spine boost 10 Gy in 5 fractions   Hodgkin's disease, nodular sclerosis, of inguinal region/lower limb ([place]) ONOLOGIST--  DR ENNEVER AND [facility]     SALVAGE CHEMO 2013/  AUTOLOGUS STEM CELL TRANSPLANT MAY 2014 AT [facility]   Mass of right chest wall 09/18/2018   Mass of right inguinal region    PVC (premature ventricular contraction)    "benign"   TIA (transient ischemic attack) 02/2015   "probable TIA"   Wears contact lenses     Past Surgical History:  Procedure Laterality Date   AXILLARY LYMPH NODE BIOPSY Left 02/02/2013   Procedure: NEEDLE LOCALIZED AXILLARY LYMPH NODE BIOPSY;  Surgeon: Haywood Lasso, MD;  Location: [place];  Service: General;  Laterality: Left;    BACK SURGERY  03/2021   BONE MARROW BIOPSY  08/26/2012   CYST REMOVAL NECK Left 11/26/1978   LEFT INGUINAL LYMPH NODE BX  09/09/2012   LYMPH NODE BIOPSY N/A 07/28/2015   Procedure: LYMPH NODE BIOPSY;  Surgeon: Melrose Nakayama, MD;  Location: [place];  Service: Thoracic;  Laterality: N/A;   MASS EXCISION Right 09/19/2018   Procedure: EXCISION OF RIGHT CHEST WALL MASS ERAS PATHWAY;  Surgeon: Fanny Skates, MD;  Location: [place];  Service: General;  Laterality: Right;   NODE DISSECTION Right 07/28/2015   Procedure: NODE DISSECTION;  Surgeon: Melrose Nakayama, MD;  Location: [facility];  Service: Thoracic;  Laterality: Right;   PLEURA BIOPSY Left 05/31/2014   REMOVAL RIGHT INGUINAL LYMPH NODES  08/16/2011   SCROTAL EXPLORATION Right 01/04/2014   Procedure: SCROTUM EXPLORATION   INGUINAL , EXCISION OF CYSTIC MASS OF RIGHT SPERMATIC CORD, [place];  Surgeon: Franchot Gallo, MD;  Location: [facility];  Service: Urology;  Laterality: Right;   TRANSTHORACIC ECHOCARDIOGRAM  03/18/2013   MILD LVH/  EF 55-60%   VIDEO ASSISTED THORACOSCOPY Left 05/31/2014   Procedure: LEFT VIDEO ASSISTED THORACOSCOPY, PLEURAL BIOPSY;  Surgeon: Melrose Nakayama, MD;  Location: [place];  Service: Thoracic;  Laterality: Left;   VIDEO ASSISTED THORACOSCOPY Right 07/28/2015   Procedure: RIGHT VIDEO ASSISTED THORACOSCOPY;  Surgeon: Melrose Nakayama, MD;  Location: [place];  Service: Thoracic;  Laterality: Right;   VIDEO ASSISTED THORACOSCOPY (VATS)/ LYMPH NODE SAMPLING Right 07/28/2015   VIDEO ASSISTED THORACOSCOPY (VATS)/WEDGE RESECTION  05/31/2014   VIDEO BRONCHOSCOPY WITH ENDOBRONCHIAL ULTRASOUND  07/28/2015   VIDEO BRONCHOSCOPY WITH ENDOBRONCHIAL ULTRASOUND N/A 07/28/2015   Procedure: VIDEO BRONCHOSCOPY WITH ENDOBRONCHIAL ULTRASOUND;  Surgeon: Melrose Nakayama, MD;  Location: [place];  Service: Thoracic;  Laterality: N/A;    There were no vitals filed for this  visit.   Subjective Assessment - 02/01/22 1157     Subjective Continuing to have right posterior thigh pain with certain movement such as sit to stand, getting in/out of car. Pain is episodic and sudden without lingering effects but feels 7-8/10 when it occurs    Pertinent History right shoulder injury from broken humerus and rotator cuff tear, recent hx of Hodgkins lymphoma, lumbar surgery    Patient Stated Goals Be more active, get stronger, be able to get in/out of bed easier, reduce freezing when walking    Pain Onset More than a month ago                               [facility] Adult PT Treatment/Exercise - 02/01/22 0001       Neuro Re-ed    Neuro Re-ed Details  lateral weight shift and reach 3x10      Lumbar Exercises: Stretches   Hip Flexor Stretch 2 reps;30 seconds    Standing Side Bend Right;Left;3 reps;30 seconds    Prone on Elbows Stretch 4 reps;30 seconds    Prone on Elbows Stretch Limitations sidebending bias to attempt symptom reproduction    Quadruped Mid Back Stretch 3 reps;60 seconds      Lumbar Exercises: Aerobic   Nustep level 5 x 5 min      Lumbar Exercises: Prone   Straight Leg Raise 20 reps    Straight Leg Raises Limitations stabilizing lumbar spine                       PT Short Term Goals - 01/02/22 1424       PT SHORT TERM GOAL #1   Title Patient will be independent in static postural HEP to improve alignment and enhance stability    Time 4    Period Weeks    Status New    Target Date 01/30/22      PT SHORT TERM GOAL #2   Title Improve BLE strength to 4/5 gross strength to enhance functional activity tolerance    Baseline 3+/5 BLE    Time 4    Period Weeks    Status New    Target Date 01/30/22      PT SHORT TERM GOAL #3   Title Manifest improved BLE strength and balance as evidenced by time of 25 sec 5xSTS    Baseline 57 sec    Time 4    Period Weeks    Status New    Target Date 01/30/22      PT SHORT  TERM GOAL #4   Title Demo bed mobility with supervision to reduce level of assistance from cargivers    Baseline CGA sit to supine, mod A supine to sit    Time 4    Period Weeks  Status New    Target Date 01/30/22               PT Long Term Goals - 01/02/22 1426       PT LONG TERM GOAL #1   Title Pt will be independent with Parkinson's specific HEP for improved posture, balance, and gait.    Time 8    Period Weeks    Status New    Target Date 02/27/22      PT LONG TERM GOAL #2   Title Demo improved safety with ambulation as evidenced by time of 20 sec TUG test    Time 8    Period Weeks    Status New    Target Date 02/27/22                   Plan - 02/01/22 1257     Clinical Impression Statement Continues to experience right posterior thigh pain with certain movements such as sit to stand and transition from prone to quadruped. Marcello Moores Test reveals normal flexibility in quad/hip flexors. No pain provocation with RLE single leg stance x 30 sec, no increase in pain with hip provocation movements, no pain with resisted hip extension, no pain with hamstring isometrics. Hip extension weakness evident during assessment revealing 3-/5 glut max strength. Continued sessions indicated to progress Parkinson's specific activities and decrease RLE discomfort to return to normal level of activity.    Personal Factors and Comorbidities Comorbidity 3+;Time since onset of injury/illness/exacerbation    Comorbidities hx of CA, right shoulder injury, recent back surgery    Examination-Activity Limitations Bed Mobility;Carry;Lift;Toileting;Stand;Stairs;Squat;Reach Overhead;Locomotion Level;Transfers    Examination-Participation Restrictions Cleaning;Community Activity;Laundry;Occupation;Meal Prep    Stability/Clinical Decision Making Evolving/Moderate complexity    Rehab Potential Good    PT Frequency 2x / week    PT Duration 8 weeks    PT Treatment/Interventions ADLs/Self Care Home  Management;Electrical Stimulation;DME Instruction;Moist Heat;Gait training;Stair training;Functional mobility training;Therapeutic activities;Therapeutic exercise;Balance training;Neuromuscular re-education;Manual techniques;Patient/family education    PT Next Visit Plan Large amplitude movement patterns (modified seated and standing PWR! Moves-avoiding pain with R shoulder), gait training, tips to reduce freezing episodes with gait.    PT Home Exercise Plan Tall sitting 3x30 sec, scapular retraction 3x10, supine lumbar trunk rotation, seated lumbar flexion-extension PWR! move    Consulted and Agree with Plan of Care Patient    Family Member Consulted significant other             Patient will benefit from skilled therapeutic intervention in order to improve the following deficits and impairments:  Abnormal gait, Decreased activity tolerance, Decreased balance, Decreased coordination, Decreased endurance, Decreased mobility, Decreased strength, Difficulty walking, Postural dysfunction, Impaired perceived functional ability, Pain, Improper body mechanics  Visit Diagnosis: Other abnormalities of gait and mobility  Unsteadiness on feet  Difficulty in walking, not elsewhere classified  Muscle weakness (generalized)     Problem List Patient Active Problem List   Diagnosis Date Noted   Closed fracture of right proximal humerus 10/03/2021   Traumatic complete tear of right rotator cuff 10/03/2021   Lumbar radiculopathy 10/03/2021   Goals of care, counseling/discussion 09/25/2018   Mass of right chest wall 09/18/2018   PD (Parkinson's disease) ([place]) 11/27/2016   Lymphadenopathy 07/28/2015   Mediastinal adenopathy 07/14/2015   Aphasia 03/04/2015   Slurred speech 03/04/2015   Pleural mass 05/31/2014   Abdominal pain, epigastric 02/02/2014   Weight loss 08/12/2012   H/O Bell's palsy 08/12/2012   Hodgkin's disease 08/09/2011  Toniann Fail, PT 02/01/2022, 1:01 PM  [facility] [address] [place], [place], [address] Phone: [phone]   Fax:  [phone]  Name: Brian [name] MRN: [id number] Date of Birth: Mar 06, 1961

## 2022-02-06 ENCOUNTER — Ambulatory Visit: Payer: Commercial Managed Care - PPO

## 2022-02-06 ENCOUNTER — Other Ambulatory Visit: Payer: Self-pay

## 2022-02-06 DIAGNOSIS — R2689 Other abnormalities of gait and mobility: Secondary | ICD-10-CM | POA: Diagnosis not present

## 2022-02-06 DIAGNOSIS — R262 Difficulty in walking, not elsewhere classified: Secondary | ICD-10-CM

## 2022-02-06 DIAGNOSIS — R2681 Unsteadiness on feet: Secondary | ICD-10-CM

## 2022-02-06 DIAGNOSIS — M6281 Muscle weakness (generalized): Secondary | ICD-10-CM

## 2022-02-06 NOTE — Therapy (Signed)
[facility] ?[address], [address] ?[place], [place], [address] ?Phone: [phone]   Fax:  [phone] ? ?Physical Therapy Treatment ? ?Patient Details  ?Name: Brian [name] ?MRN: [id number] ?Date of Birth: [birth year] ?Referring Provider (PT): Tat, Eustace Quail, DO ? ? ?Encounter Date: 02/06/2022 ? ? PT End of Session - 02/06/22 1150   ? ? Visit Number 9   ? Number of Visits 16   ? Date for PT Re-Evaluation 02/27/22   ? Authorization Type UMR, no VL, medicial necessity review after 25th visit   ? Authorization - Visit Number 9   ? Authorization - Number of Visits 24   ? PT Start Time 1150   ? PT Stop Time 1230   ? PT Time Calculation (min) 40 min   ? Activity Tolerance Patient tolerated treatment well   ? Behavior During Therapy Restless;WFL for tasks assessed/performed   ? ?  ?  ? ?  ? ? ?Past Medical History:  ?Diagnosis Date  ? Anxiety   ? Dysrhythmia   ? past hx pvc  ? Goals of care, counseling/discussion 09/25/2018  ? H/O autologous stem cell transplant ([place])   ? may 2014  at [place]  ? History of Bell's palsy   ? 2009  RIGHT SIDE--  HAS 80% FUNCTION / PT STATES A LITTLE ASYMETRICAL AND EFFECTS MOUTH  ? History of radiation therapy 10/18/17-11/05/17  ? sprine T1 26 Gy in 13 fractions, spine boost 10 Gy in 5 fractions  ? Hodgkin's disease, nodular sclerosis, of inguinal region/lower limb ([place]) ONOLOGIST--  DR Marin Olp AND A [facility]  ?   SALVAGE CHEMO 2013/  AUTOLOGUS STEM CELL TRANSPLANT MAY 2014 AT [facility]  ? Mass of right chest wall 09/18/2018  ? Mass of right inguinal region   ? PVC (premature ventricular contraction)   ? "benign"  ? TIA (transient ischemic attack) 02/2015  ? "probable TIA"  ? Wears contact lenses   ? ? ?Past Surgical History:  ?Procedure Laterality Date  ? AXILLARY LYMPH NODE BIOPSY Left 02/02/2013  ? Procedure: NEEDLE LOCALIZED AXILLARY LYMPH NODE BIOPSY;  Surgeon: Haywood Lasso, MD;  Location: [place];  Service: General;  Laterality: Left;  ?  BACK SURGERY  03/2021  ? BONE MARROW BIOPSY  08/26/2012  ? CYST REMOVAL NECK Left 11/26/1978  ? LEFT INGUINAL LYMPH NODE BX  09/09/2012  ? LYMPH NODE BIOPSY N/A 07/28/2015  ? Procedure: LYMPH NODE BIOPSY;  Surgeon: Melrose Nakayama, MD;  Location: [place];  Service: Thoracic;  Laterality: N/A;  ? MASS EXCISION Right 09/19/2018  ? Procedure: EXCISION OF RIGHT CHEST WALL MASS ERAS PATHWAY;  Surgeon: Fanny Skates, MD;  Location: [place];  Service: General;  Laterality: Right;  ? NODE DISSECTION Right 07/28/2015  ? Procedure: NODE DISSECTION;  Surgeon: Melrose Nakayama, MD;  Location: [place];  Service: Thoracic;  Laterality: Right;  ? PLEURA BIOPSY Left 05/31/2014  ? REMOVAL RIGHT INGUINAL LYMPH NODES  08/16/2011  ? SCROTAL EXPLORATION Right 01/04/2014  ? Procedure: SCROTUM EXPLORATION   INGUINAL , EXCISION OF CYSTIC MASS OF RIGHT SPERMATIC CORD, WITH FROZEN SECTION;  Surgeon: Franchot Gallo, MD;  Location: [facility];  Service: Urology;  Laterality: Right;  ? TRANSTHORACIC ECHOCARDIOGRAM  03/18/2013  ? MILD LVH/  EF 55-60%  ? VIDEO ASSISTED THORACOSCOPY Left 05/31/2014  ? Procedure: LEFT VIDEO ASSISTED THORACOSCOPY, PLEURAL BIOPSY;  Surgeon: Melrose Nakayama, MD;  Location: [place];  Service: Thoracic;  Laterality: Left;  ? VIDEO ASSISTED THORACOSCOPY Right 07/28/2015  ? Procedure: RIGHT VIDEO ASSISTED THORACOSCOPY;  Surgeon: Melrose Nakayama, MD;  Location: [place];  Service: Thoracic;  Laterality: Right;  ? VIDEO ASSISTED THORACOSCOPY (VATS)/ LYMPH NODE SAMPLING Right 07/28/2015  ? VIDEO ASSISTED THORACOSCOPY (VATS)/WEDGE RESECTION  05/31/2014  ? VIDEO BRONCHOSCOPY WITH ENDOBRONCHIAL ULTRASOUND  07/28/2015  ? VIDEO BRONCHOSCOPY WITH ENDOBRONCHIAL ULTRASOUND N/A 07/28/2015  ? Procedure: VIDEO BRONCHOSCOPY WITH ENDOBRONCHIAL ULTRASOUND;  Surgeon: Melrose Nakayama, MD;  Location: [facility];  Service: Thoracic;  Laterality: N/A;  ? ? ?There were no vitals filed for this  visit. ? ? Subjective Assessment - 02/06/22 1150   ? ? Subjective Improved back and RLE symptoms, "less irritable". PD medication at 7/11 AM, 1/3 PM, and nighttime dose   ? Pertinent History right shoulder injury from broken humerus and rotator cuff tear, recent hx of Hodgkins lymphoma, lumbar surgery   ? Patient Stated Goals Be more active, get stronger, be able to get in/out of bed easier, reduce freezing when walking   ? Currently in Pain? No/denies   ? Pain Score 0-No pain   ? Pain Onset More than a month ago   ? ?  ?  ? ?  ? ? ? ? ? [facility] PT Assessment - 02/06/22 0001   ? ?  ? Assessment  ? Medical Diagnosis PD (Parkinson's disease   ? ?  ?  ? ?  ? ? ? ? ? ? ? ? ? ? ? ? ? ? ? ? [facility] Adult PT Treatment/Exercise - 02/06/22 0001   ? ?  ? Neuro Re-ed   ? Neuro Re-ed Details  lateral weight shift and reach 30x. 4 Square step 6" hurdles 5x clockwise/counterclockwise. Lateral stepping over 6" hurdles 6x, grapevine over hurdles. Lateral step with single limb stance balance left/right 6x over hurdles. Supine dying bug with tactile cues for sequence 2x10, repeated with swiss ball hold 2x10   ?  ? Lumbar Exercises: Sidelying  ? Other Sidelying Lumbar Exercises sidelying ER for right shoulder 3x10 2#   ? ?  ?  ? ?  ? ? ? ? ? ? ? ? ? ? PT Education - 02/06/22 1239   ? ? Education Details instructed in performance and benefits of sidelying ER exercise for right shoulder function   ? Person(s) Educated Patient   ? Methods Explanation;Demonstration;Handout   ? Comprehension Verbalized understanding;Returned demonstration   ? ?  ?  ? ?  ? ? ? PT Short Term Goals - 01/02/22 1424   ? ?  ? PT SHORT TERM GOAL #1  ? Title Patient will be independent in static postural HEP to improve alignment and enhance stability   ? Time 4   ? Period Weeks   ? Status New   ? Target Date 01/30/22   ?  ? PT SHORT TERM GOAL #2  ? Title Improve BLE strength to 4/5 gross strength to enhance functional activity tolerance   ? Baseline 3+/5 BLE   ? Time 4    ? Period Weeks   ? Status New   ? Target Date 01/30/22   ?  ? PT SHORT TERM GOAL #3  ? Title Manifest improved BLE strength and balance as evidenced by time of 25 sec 5xSTS   ? Baseline 57 sec   ? Time 4   ? Period Weeks   ? Status New   ? Target Date 01/30/22   ?  ?  PT SHORT TERM GOAL #4  ? Title Demo bed mobility with supervision to reduce level of assistance from cargivers   ? Baseline CGA sit to supine, mod A supine to sit   ? Time 4   ? Period Weeks   ? Status New   ? Target Date 01/30/22   ? ?  ?  ? ?  ? ? ? ? PT Long Term Goals - 01/02/22 1426   ? ?  ? PT LONG TERM GOAL #1  ? Title Pt will be independent with Parkinson's specific HEP for improved posture, balance, and gait.   ? Time 8   ? Period Weeks   ? Status New   ? Target Date 02/27/22   ?  ? PT LONG TERM GOAL #2  ? Title Demo improved safety with ambulation as evidenced by time of 20 sec TUG test   ? Time 8   ? Period Weeks   ? Status New   ? Target Date 02/27/22   ? ?  ?  ? ?  ? ? ? ? ? ? ? ? Plan - 02/06/22 1239   ? ? Clinical Impression Statement Continued with session per POC as pt back/RLE feeling better/less irritated. Continued with dynamic balance and coordination activities with emphasis on quick movements and stabilization in single limb support. Demo several bouts of unsteadiness when maneuvering laterally and requiring single limb support (simulated action/position as stepping in/out of bathtub) requiring tactile support to prevent fall/LOB with tendency to have feet cross over and tangle. Difficulty in sequencing "dying bug" exercise requiring use of swiss ball to provide more external cueing and also demonstrates poor core stabilization/endurance requiring cues to maintain brace and provide for rest periods Q 10th rep. Continued sessions to progress dynamic balance/coordination to reduce risk for falls   ? Personal Factors and Comorbidities Comorbidity 3+;Time since onset of injury/illness/exacerbation   ? Comorbidities hx of CA, right  shoulder injury, recent back surgery   ? Examination-Activity Limitations Bed Mobility;Carry;Lift;Toileting;Stand;Stairs;Squat;Reach Overhead;Locomotion Level;Transfers   ? Examination-Participation Rest

## 2022-02-07 ENCOUNTER — Inpatient Hospital Stay: Payer: Commercial Managed Care - PPO | Attending: Hematology & Oncology

## 2022-02-07 ENCOUNTER — Inpatient Hospital Stay (HOSPITAL_BASED_OUTPATIENT_CLINIC_OR_DEPARTMENT_OTHER): Payer: Commercial Managed Care - PPO | Admitting: Hematology & Oncology

## 2022-02-07 VITALS — BP 102/72 | HR 84 | Temp 97.7°F | Wt 181.1 lb

## 2022-02-07 DIAGNOSIS — G20 Parkinson's disease: Secondary | ICD-10-CM | POA: Diagnosis not present

## 2022-02-07 DIAGNOSIS — C8195 Hodgkin lymphoma, unspecified, lymph nodes of inguinal region and lower limb: Secondary | ICD-10-CM

## 2022-02-07 DIAGNOSIS — Z9484 Stem cells transplant status: Secondary | ICD-10-CM | POA: Diagnosis not present

## 2022-02-07 DIAGNOSIS — Z923 Personal history of irradiation: Secondary | ICD-10-CM | POA: Diagnosis not present

## 2022-02-07 DIAGNOSIS — Z8571 Personal history of Hodgkin lymphoma: Secondary | ICD-10-CM | POA: Diagnosis not present

## 2022-02-07 LAB — CBC WITH DIFFERENTIAL (CANCER CENTER ONLY)
Abs Immature Granulocytes: 0.05 10*3/uL (ref 0.00–0.07)
Basophils Absolute: 0.1 10*3/uL (ref 0.0–0.1)
Basophils Relative: 1 %
Eosinophils Absolute: 0.3 10*3/uL (ref 0.0–0.5)
Eosinophils Relative: 4 %
HCT: 38.6 % — ABNORMAL LOW (ref 39.0–52.0)
Hemoglobin: 12.4 g/dL — ABNORMAL LOW (ref 13.0–17.0)
Immature Granulocytes: 1 %
Lymphocytes Relative: 13 %
Lymphs Abs: 0.8 10*3/uL (ref 0.7–4.0)
MCH: 31.3 pg (ref 26.0–34.0)
MCHC: 32.1 g/dL (ref 30.0–36.0)
MCV: 97.5 fL (ref 80.0–100.0)
Monocytes Absolute: 0.8 10*3/uL (ref 0.1–1.0)
Monocytes Relative: 12 %
Neutro Abs: 4.7 10*3/uL (ref 1.7–7.7)
Neutrophils Relative %: 69 %
Platelet Count: 224 10*3/uL (ref 150–400)
RBC: 3.96 MIL/uL — ABNORMAL LOW (ref 4.22–5.81)
RDW: 12.9 % (ref 11.5–15.5)
WBC Count: 6.7 10*3/uL (ref 4.0–10.5)
nRBC: 0 % (ref 0.0–0.2)

## 2022-02-07 LAB — CMP (CANCER CENTER ONLY)
ALT: <5 U/L (ref 0–44)
AST: 14 U/L — ABNORMAL LOW (ref 15–41)
Albumin: 4.4 g/dL (ref 3.5–5.0)
Alkaline Phosphatase: 70 U/L (ref 38–126)
Anion gap: 6 (ref 5–15)
BUN: 34 mg/dL — ABNORMAL HIGH (ref 8–23)
CO2: 28 mmol/L (ref 22–32)
Calcium: 9.5 mg/dL (ref 8.9–10.3)
Chloride: 106 mmol/L (ref 98–111)
Creatinine: 1.23 mg/dL (ref 0.61–1.24)
GFR, Estimated: >60 mL/min (ref >60)
Glucose, Bld: 93 mg/dL (ref 70–99)
Potassium: 4.4 mmol/L (ref 3.5–5.1)
Sodium: 140 mmol/L (ref 135–145)
Total Bilirubin: 0.6 mg/dL (ref 0.3–1.2)
Total Protein: 5.9 g/dL — ABNORMAL LOW (ref 6.5–8.1)

## 2022-02-07 LAB — LACTATE DEHYDROGENASE: LDH: 185 U/L (ref 98–192)

## 2022-02-07 NOTE — Progress Notes (Signed)
?Hematology and Oncology Follow Up Visit ? ?Brian [name] ?[id number] ?1961-08-28 61 y.o. ?02/07/2022 ? ? 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 ?Current Therapy:  ?Nivolumab q 3 month -- s/p cycle #18 -- on hold  ? ?    ?Interim History:  [name] is in for follow-up.  He seems to be doing much better.  His Parkinson's does not seem to be as troublesome.  He did have an adjustment of his medication so that he does not have as much toxicity. ? ?He says that his left leg does not hurt all that much now.  He still has a back discomfort.  He did go out to [employer].  An epidural injection out there. ? ?He is still working.  I think he works from home. ? ?He is still exercising.  He is trying to go to the gym as much as possible. ? ?There is been no issues with fever.  He has had no problems with COVID. ? ?He is eating quite well.  There is been no nausea or vomiting.  He has had no obvious change in bowel or bladder habits. ? ?Overall, I would say his performance status is ECOG 1.  ? ? ?Medications:  ?Current Outpatient Medications:  ?  acetaminophen (TYLENOL) 500 MG tablet, Take by mouth., Disp: , Rfl:  ?  carbidopa-levodopa (SINEMET CR) 50-200 MG tablet, Take 1 tablet by mouth at bedtime., Disp: 90 tablet, Rfl: 1 ?  carbidopa-levodopa (SINEMET IR) 25-100 MG tablet, 2 tablets at 7 AM, 2 tablets at 11 AM, 2 tablet at 3 PM, 2 tablet at 7 PM, Disp: 720 tablet, Rfl: 1 ?  cholecalciferol (VITAMIN D3) 25 MCG (1000 UNIT) tablet, Take 1,000 Units by mouth daily., Disp: , Rfl:  ?  HYDROcodone-acetaminophen (NORCO/VICODIN) 5-325 MG tablet, Take 1 tablet by mouth every 8 (eight) hours as needed., Disp: 15 tablet, Rfl: 0 ?  ibuprofen (ADVIL) 200 MG tablet, Take by mouth., Disp: , Rfl:  ?  lidocaine (LIDODERM) 5 %, Place 1 patch onto the skin daily. Remove & Discard patch within 12 hours or as directed by MD, Disp: 30 patch, Rfl: 0 ?  olmesartan (BENICAR) 20 MG tablet, Take 1 tablet (20  mg total) by mouth daily., Disp: 30 tablet, Rfl: 3 ?  oxyCODONE-acetaminophen (PERCOCET) 10-325 MG tablet, take 1 tablet by oral route  every 6 hours as needed for moderate or severe pain, Disp: , Rfl:  ?  pramipexole (MIRAPEX) 0.5 MG tablet, Take 1 tablet (0.5 mg total) by mouth 3 (three) times daily., Disp: 270 tablet, Rfl: 1 ?  tamsulosin (FLOMAX) 0.4 MG CAPS capsule, Take 1 capsule (0.4 mg total) by mouth daily after supper., Disp: 30 capsule, Rfl: 3 ? ?Allergies: No Known Allergies ? ?Past Medical History, Surgical history, Social history, and Family History were reviewed and updated. ? ?Review of Systems: ?Review of Systems  ?Neurological:  Positive for tremors.  ?All other systems reviewed and are negative. ? ? ?Physical Exam: ? weight is 82.1 kg. His oral temperature is 97.7 ?F (36.5 ?C). His blood pressure is 102/72 and his pulse is 84.  ? ?Physical Exam ?Vitals reviewed.  ?HENT:  ?   Head: Normocephalic and atraumatic.  ?Eyes:  ?   Pupils: Pupils are equal, round, and reactive to light.  ?Cardiovascular:  ?   Rate and Rhythm: Normal rate and regular rhythm.  ?   Heart sounds: Normal heart sounds.  ?Pulmonary:  ?  Effort: Pulmonary effort is normal.  ?   Breath sounds: Normal breath sounds.  ?Abdominal:  ?   General: Bowel sounds are normal.  ?   Palpations: Abdomen is soft.  ?Musculoskeletal:     ?   General: No tenderness or deformity. Normal range of motion.  ?   Cervical back: Normal range of motion.  ?Lymphadenopathy:  ?   Cervical: No cervical adenopathy.  ?Skin: ?   General: Skin is warm and dry.  ?   Findings: No erythema or rash.  ?Neurological:  ?   Mental Status: He is alert and oriented to person, place, and time.  ?Psychiatric:     ?   Behavior: Behavior normal.     ?   Thought Content: Thought content normal.     ?   Judgment: Judgment normal.  ? ? ?Lab Results  ?Component Value Date  ? WBC 6.7 02/07/2022  ? HGB 12.4 (L) 02/07/2022  ? HCT 38.6 (L) 02/07/2022  ? MCV 97.5 02/07/2022  ? PLT 224  02/07/2022  ? ?  Chemistry   ?   ?Component Value Date/Time  ? NA 140 12/04/2021 1054  ? NA 142 04/02/2017 0000  ? NA 140 07/19/2016 1305  ? K 4.6 12/04/2021 1054  ? K 4.4 07/19/2016 1305  ? CL 106 12/04/2021 1054  ? CL 105 02/08/2016 1117  ? CL 107 03/25/2013 0828  ? CO2 27 12/04/2021 1054  ? CO2 26 07/19/2016 1305  ? BUN 40 (H) 12/04/2021 1054  ? BUN 21 04/02/2017 0000  ? BUN 22.8 07/19/2016 1305  ? CREATININE 1.12 12/04/2021 1054  ? CREATININE 1.21 11/09/2021 1204  ? CREATININE 1.1 07/19/2016 1305  ? GLU 105 04/02/2017 0000  ?    ?Component Value Date/Time  ? CALCIUM 9.6 12/04/2021 1054  ? CALCIUM 9.8 07/19/2016 1305  ? ALKPHOS 81 12/04/2021 1054  ? ALKPHOS 76 07/19/2016 1305  ? AST 16 12/04/2021 1054  ? AST 18 11/09/2021 1204  ? AST 18 07/19/2016 1305  ? ALT 6 12/04/2021 1054  ? ALT <5 11/09/2021 1204  ? ALT 14 07/19/2016 1305  ? BILITOT 0.6 12/04/2021 1054  ? BILITOT 0.5 11/09/2021 1204  ? BILITOT 0.70 07/19/2016 1305  ?  ? ? ?Impression and Plan: ?Brian [name] is a 61 year old gentleman.  He has history of recurrent Hodgkin's disease. 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 He then had a recurrence after this. ? ?He did undergo CAR[facility]T therapy at [facility].  I believe he had this in April 2017.  He did well with this.  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  This was proven by biopsy.  He had radiation therapy for this. ? ?He is off the [place] now.  He has not had Opdivo now for almost a year. ? ?We will go ahead and do another PET scan on him in June.  I would have to think that we will still find that he is in remission. ? ?I am just glad that his overall quality of life is better.  His girlfriend came with him.  She is incredibly charming and really takes good care of him. ? ?I will plan to see him back in late June.   ? ? [name], MD ?3/15/20231:19 PM ? ?

## 2022-02-08 ENCOUNTER — Ambulatory Visit: Payer: Commercial Managed Care - PPO

## 2022-02-15 ENCOUNTER — Other Ambulatory Visit: Payer: Self-pay

## 2022-02-15 ENCOUNTER — Ambulatory Visit: Payer: Commercial Managed Care - PPO | Admitting: Rehabilitative and Restorative Service Providers"

## 2022-02-15 DIAGNOSIS — R2689 Other abnormalities of gait and mobility: Secondary | ICD-10-CM | POA: Diagnosis not present

## 2022-02-15 DIAGNOSIS — R262 Difficulty in walking, not elsewhere classified: Secondary | ICD-10-CM

## 2022-02-15 DIAGNOSIS — M6281 Muscle weakness (generalized): Secondary | ICD-10-CM

## 2022-02-15 DIAGNOSIS — R2681 Unsteadiness on feet: Secondary | ICD-10-CM

## 2022-02-15 NOTE — Therapy (Signed)
[facility] ?[address], [address] ?[place], [place], [address] ?Phone: [phone]   Fax:  [phone] ? ?Physical Therapy Treatment and 10th visit Progress Note ? ?Patient Details  ?Name: Brian [name] ?MRN: [id number] ?Date of Birth: 16-Jul-1961 ?Referring Provider (PT): Tat, Eustace Quail, DO ? ?Progress Note ?Reporting Period 01/02/22 to 02/15/22 ? ?See note below for Objective Data and Assessment of Progress/Goals.  ? ? ? ? ?Encounter Date: 02/15/2022 ? ? PT End of Session - 02/15/22 1154   ? ? Visit Number 10   ? Number of Visits 16   ? Date for PT Re-Evaluation 02/27/22   ? Authorization Type UMR, no VL, medicial necessity review after 25th visit   ? Authorization - Visit Number 10   ? Authorization - Number of Visits 24   ? PT Start Time 1150   ? PT Stop Time 1234   ? PT Time Calculation (min) 44 min   ? Activity Tolerance Patient tolerated treatment well   ? Behavior During Therapy Restless;WFL for tasks assessed/performed   ? ?  ?  ? ?  ? ? ?Past Medical History:  ?Diagnosis Date  ? Anxiety   ? Dysrhythmia   ? past hx pvc  ? Goals of care, counseling/discussion 09/25/2018  ? H/O autologous stem cell transplant ([place])   ? may 2014  at [place]  ? History of Bell's palsy   ? 2009  RIGHT SIDE--  HAS 80% FUNCTION / PT STATES A LITTLE ASYMETRICAL AND EFFECTS MOUTH  ? History of radiation therapy 10/18/17-11/05/17  ? sprine T1 26 Gy in 13 fractions, spine boost 10 Gy in 5 fractions  ? Hodgkin's disease, nodular sclerosis, of inguinal region/lower limb ([place]) ONOLOGIST--  DR Marin Olp AND A [facility]  ?   SALVAGE CHEMO 2013/  AUTOLOGUS STEM CELL TRANSPLANT MAY 2014 AT [facility]  ? Mass of right chest wall 09/18/2018  ? Mass of right inguinal region   ? PVC (premature ventricular contraction)   ? "benign"  ? TIA (transient ischemic attack) 02/2015  ? "probable TIA"  ? Wears contact lenses   ? ? ?Past Surgical History:  ?Procedure Laterality Date  ? AXILLARY LYMPH NODE BIOPSY Left 02/02/2013  ?  Procedure: NEEDLE LOCALIZED AXILLARY LYMPH NODE BIOPSY;  Surgeon: Haywood Lasso, MD;  Location: [facility];  Service: General;  Laterality: Left;  ? BACK SURGERY  03/2021  ? BONE MARROW BIOPSY  08/26/2012  ? CYST REMOVAL NECK Left 11/26/1978  ? LEFT INGUINAL LYMPH NODE BX  09/09/2012  ? LYMPH NODE BIOPSY N/A 07/28/2015  ? Procedure: LYMPH NODE BIOPSY;  Surgeon: Melrose Nakayama, MD;  Location: [facility] [place];  Service: Thoracic;  Laterality: N/A;  ? MASS EXCISION Right 09/19/2018  ? Procedure: EXCISION OF RIGHT CHEST WALL MASS ERAS PATHWAY;  Surgeon: Fanny Skates, MD;  Location: [facility];  Service: General;  Laterality: Right;  ? NODE DISSECTION Right 07/28/2015  ? Procedure: NODE DISSECTION;  Surgeon: Melrose Nakayama, MD;  Location: [facility];  Service: Thoracic;  Laterality: Right;  ? PLEURA BIOPSY Left 05/31/2014  ? REMOVAL RIGHT INGUINAL LYMPH NODES  08/16/2011  ? SCROTAL EXPLORATION Right 01/04/2014  ? 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 TRANSTHORACIC ECHOCARDIOGRAM  03/18/2013  ? MILD LVH/  EF 55-60%  ?  VIDEO ASSISTED THORACOSCOPY Left 05/31/2014  ? Procedure: LEFT VIDEO ASSISTED THORACOSCOPY, PLEURAL BIOPSY;  Surgeon: Melrose Nakayama, MD;  Location: [facility];  Service: Thoracic;  Laterality: Left;  ? VIDEO ASSISTED THORACOSCOPY Right 07/28/2015  ? Procedure: RIGHT VIDEO ASSISTED THORACOSCOPY;  Surgeon: Melrose Nakayama, MD;  Location: [facility];  Service: Thoracic;  Laterality: Right;  ? VIDEO ASSISTED THORACOSCOPY (VATS)/ LYMPH NODE SAMPLING Right 07/28/2015  ? VIDEO ASSISTED THORACOSCOPY (VATS)/WEDGE RESECTION  05/31/2014  ? VIDEO BRONCHOSCOPY WITH ENDOBRONCHIAL ULTRASOUND  07/28/2015  ? VIDEO BRONCHOSCOPY WITH ENDOBRONCHIAL ULTRASOUND N/A 07/28/2015  ? Procedure: VIDEO  BRONCHOSCOPY WITH ENDOBRONCHIAL ULTRASOUND;  Surgeon: Melrose Nakayama, MD;  Location: [facility];  Service: Thoracic;  Laterality: N/A;  ? ? ?There were no vitals filed for this visit. ? ? Subjective Assessment - 02/15/22 1152   ? ? Subjective The patient notes occasional R posterior thigh flare ups.   ? Pertinent History right shoulder injury from broken humerus and rotator cuff tear, recent hx of Hodgkins lymphoma, lumbar surgery   ? Patient Stated Goals Be more active, get stronger, be able to get in/out of bed easier, reduce freezing when walking   ? Currently in Pain? No/denies   ? ?  ?  ? ?  ? ? ? ? ? [facility] PT Assessment - 02/15/22 1202   ? ?  ? Ambulation/Gait  ? Pre-Gait Activities directional changes forward/backwards, marching, high/fast marching, slow jogging-- all with CGA   ? ?  ?  ? ?  ? ? ? ? ? ? ? ? ? ? ? ? ? ? ? ? [facility] Adult PT Treatment/Exercise - 02/15/22 1202   ? ?  ? Ambulation/Gait  ? Ambulation/Gait Yes   ?  ? Neuro Re-ed   ? Neuro Re-ed Details  Large amplitude movement performing sit<>stand with arm swing, standing lateral stepping, standing wegiht shift alternating R and L sides.  Tall kneel to large amplitude 1/2 kneeling working on hip position and alternating LE activities.  Square stepping over 6" obstacles CW and CCW x 5 reps with close supervision. Passing a ball between legs working on UE coordination and weight shifting; lunges with UE "digging" for bilat UE engagement and trunk rotation.   ?  ? Exercises  ? Exercises Other Exercises   ? Other Exercises  thread the needle in quadriped to lateral opening x 10 reps R and L, door frame stretch for posture; bilat hamstring stretching   ?  ? Lumbar Exercises: Stretches  ? Active Hamstring Stretch Right;Left;1 rep;30 seconds   ? Quadruped Mid Back Stretch 2 reps;30 seconds   ? ?  ?  ? ?  ? ? ? ? ? ? ? ? ? ? ? ? PT Short Term Goals - 02/15/22 1433   ? ?  ? PT SHORT TERM GOAL #1  ? Title Patient will be independent in static postural HEP to  improve alignment and enhance stability   ? Baseline has HEP and work out with reciprocal bike at gym   ? Time 4   ? Period Weeks   ? Status Partially Met   ? Target Date 01/30/22   ?  ? PT SHORT TERM GOAL #2  ? Title Improve BLE strength to 4/5 gross strength to enhance functional activity tolerance   ? Baseline 3+/5 BLE   ? Time 4   ? Period Weeks   ? Status On-going   ? Target Date 01/30/22   ?  ? PT SHORT TERM  GOAL #3  ? Title Manifest improved BLE strength and balance as evidenced by time of 25 sec 5xSTS   ? Baseline 57 sec   ? Time 4   ? Period Weeks   ? Status On-going   ? Target Date 01/30/22   ?  ? PT SHORT TERM GOAL #4  ? Title Demo bed mobility with supervision to reduce level of assistance from cargivers   ? Baseline Patient is inependent today with bed mobility   ? Time 4   ? Period Weeks   ? Status Achieved   ? Target Date 01/30/22   ? ?  ?  ? ?  ? ? ? ? PT Long Term Goals - 01/02/22 1426   ? ?  ? PT LONG TERM GOAL #1  ? Title Pt will be independent with Parkinson's specific HEP for improved posture, balance, and gait.   ? Time 8   ? Period Weeks   ? Status New   ? Target Date 02/27/22   ?  ? PT LONG TERM GOAL #2  ? Title Demo improved safety with ambulation as evidenced by time of 20 sec TUG test   ? Time 8   ? Period Weeks   ? Status New   ? Target Date 02/27/22   ? ?  ?  ? ?  ? ? ? ? ? ? ? ? Plan - 02/15/22 1434   ? ? Clinical Impression Statement The patient has met STG for bed mobility and per observation the 5 time sit<>stand *will test next visit.  PT progressing large amplitude movements and working on general strength and conditioning for core, posture, and R UE.  PT to continue to progress with goal to transition to [facility] community exercise class.  The patient is making progress with quality of gait, balance, postural awareness, core stability.  PT to continue working towards STGs/LTGs.   ? Rehab Potential Good   ? PT Frequency 2x / week   ? PT Duration 8 weeks   ? PT Treatment/Interventions  ADLs/Self Care Home Management;Electrical Stimulation;DME Instruction;Moist Heat;Gait training;Stair training;Functional mobility training;Therapeutic activities;Therapeutic exercise;Balance training;Neuromuscular re-educ

## 2022-02-20 ENCOUNTER — Telehealth: Payer: Self-pay | Admitting: *Deleted

## 2022-02-20 NOTE — Telephone Encounter (Signed)
Received medical records STAT request from [facility] - faxed request to [facility] @ [phone] ?

## 2022-02-23 ENCOUNTER — Ambulatory Visit: Payer: Commercial Managed Care - PPO | Admitting: Physical Therapy

## 2022-02-28 ENCOUNTER — Ambulatory Visit
Payer: Commercial Managed Care - PPO | Attending: Neurology | Admitting: Rehabilitative and Restorative Service Providers"

## 2022-02-28 DIAGNOSIS — R262 Difficulty in walking, not elsewhere classified: Secondary | ICD-10-CM | POA: Diagnosis present

## 2022-02-28 DIAGNOSIS — M6281 Muscle weakness (generalized): Secondary | ICD-10-CM | POA: Diagnosis present

## 2022-02-28 DIAGNOSIS — R2689 Other abnormalities of gait and mobility: Secondary | ICD-10-CM | POA: Diagnosis present

## 2022-02-28 DIAGNOSIS — R2681 Unsteadiness on feet: Secondary | ICD-10-CM | POA: Diagnosis present

## 2022-02-28 NOTE — Therapy (Signed)
[facility] ?[address], [address] ?[place], [place], [address] ?Phone: [phone]   Fax:  [phone] ? ?Physical Therapy Treatment and Renewal Summary ? ?Patient Details  ?Name: Brian [name] ?MRN: [id number] ?Date of Birth: Oct 14, 1961 ?Referring Provider (PT): Tat, Eustace Quail, DO ? ? ?Encounter Date: 02/28/2022 ? ? PT End of Session - 02/28/22 0850   ? ? Visit Number 11   ? Number of Visits 19   ? Date for PT Re-Evaluation 03/30/22   ? Authorization Type UMR, no VL, medicial necessity review after 25th visit   ? Authorization - Visit Number 11   ? Authorization - Number of Visits 24   ? PT Start Time 0848   ? PT Stop Time [phone]   ? PT Time Calculation (min) 40 min   ? Activity Tolerance Patient tolerated treatment well   ? Behavior During Therapy [facility] for tasks assessed/performed   ? ?  ?  ? ?  ? ? ?Past Medical History:  ?Diagnosis Date  ? Anxiety   ? Dysrhythmia   ? past hx pvc  ? Goals of care, counseling/discussion 09/25/2018  ? H/O autologous stem cell transplant ([place])   ? may 2014  at [place]  ? History of Bell's palsy   ? 2009  RIGHT SIDE--  HAS 80% FUNCTION / PT STATES A LITTLE ASYMETRICAL AND EFFECTS MOUTH  ? History of radiation therapy 10/18/17-11/05/17  ? sprine T1 26 Gy in 13 fractions, spine boost 10 Gy in 5 fractions  ? Hodgkin's disease, nodular sclerosis, of inguinal region/lower limb ([place]) ONOLOGIST--  DR Marin Olp AND A [facility]  ?   SALVAGE CHEMO 2013/  AUTOLOGUS STEM CELL TRANSPLANT MAY 2014 AT [facility]  ? Mass of right chest wall 09/18/2018  ? Mass of right inguinal region   ? PVC (premature ventricular contraction)   ? "benign"  ? TIA (transient ischemic attack) 02/2015  ? "probable TIA"  ? Wears contact lenses   ? ? ?Past Surgical History:  ?Procedure Laterality Date  ? AXILLARY LYMPH NODE BIOPSY Left 02/02/2013  ? Procedure: NEEDLE LOCALIZED AXILLARY LYMPH NODE BIOPSY;  Surgeon: Haywood Lasso, MD;  Location: [facility];  Service: General;   Laterality: Left;  ? BACK SURGERY  03/2021  ? BONE MARROW BIOPSY  08/26/2012  ? CYST REMOVAL NECK Left 11/26/1978  ? LEFT INGUINAL LYMPH NODE BX  09/09/2012  ? LYMPH NODE BIOPSY N/A 07/28/2015  ? Procedure: LYMPH NODE BIOPSY;  Surgeon: Melrose Nakayama, MD;  Location: [place];  Service: Thoracic;  Laterality: N/A;  ? MASS EXCISION Right 09/19/2018  ? Procedure: EXCISION OF RIGHT CHEST WALL MASS ERAS PATHWAY;  Surgeon: Fanny Skates, MD;  Location: [facility];  Service: General;  Laterality: Right;  ? NODE DISSECTION Right 07/28/2015  ? Procedure: NODE DISSECTION;  Surgeon: Melrose Nakayama, MD;  Location: [facility];  Service: Thoracic;  Laterality: Right;  ? PLEURA BIOPSY Left 05/31/2014  ? REMOVAL RIGHT INGUINAL LYMPH NODES  08/16/2011  ? SCROTAL EXPLORATION Right 01/04/2014  ? 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 TRANSTHORACIC ECHOCARDIOGRAM  03/18/2013  ? MILD LVH/  EF 55-60%  ? VIDEO ASSISTED THORACOSCOPY Left 05/31/2014  ? Procedure: LEFT VIDEO ASSISTED THORACOSCOPY, PLEURAL BIOPSY;  Surgeon: Melrose Nakayama, MD;  [facility];  Service: Thoracic;  Laterality: Left;  ? VIDEO ASSISTED THORACOSCOPY Right 07/28/2015  ? Procedure: RIGHT VIDEO ASSISTED THORACOSCOPY;  Surgeon: Melrose Nakayama, MD;  Location: [place];  Service: Thoracic;  Laterality: Right;  ? VIDEO ASSISTED THORACOSCOPY (VATS)/ LYMPH NODE SAMPLING Right 07/28/2015  ? VIDEO ASSISTED THORACOSCOPY (VATS)/WEDGE RESECTION  05/31/2014  ? VIDEO BRONCHOSCOPY WITH ENDOBRONCHIAL ULTRASOUND  07/28/2015  ? VIDEO BRONCHOSCOPY WITH ENDOBRONCHIAL ULTRASOUND N/A 07/28/2015  ? Procedure: VIDEO BRONCHOSCOPY WITH ENDOBRONCHIAL ULTRASOUND;  Surgeon: Melrose Nakayama, MD;  Location: [facility];  Service: Thoracic;  Laterality: N/A;  ? ? ?There were no  vitals filed for this visit. ? ? Subjective Assessment - 02/28/22 0848   ? ? Subjective The patient fell yesterday on mulch walking downhill.  He has some abrasions on his hands.  He broke his fall with his arms and hit his head.   ? Pertinent History right shoulder injury from broken humerus and rotator cuff tear, recent hx of Hodgkins lymphoma, lumbar surgery   ? Patient Stated Goals Be more active, get stronger, be able to get in/out of bed easier, reduce freezing when walking   ? Currently in Pain? No/denies   ? ?  ?  ? ?  ? ? ? ? ? [facility] PT Assessment - 02/28/22 0903   ? ?  ? Strength  ? Overall Strength Deficits   ? Overall Strength Comments L ankle DF is 3+/5-- all others 5/5   ? ?  ?  ? ?  ? ? ? ? ? ? ? ? ? ? ? ? ? ? ? ? [facility] Adult PT Treatment/Exercise - 02/28/22 0902   ? ?  ? Self-Care  ? Self-Care Other Self-Care Comments   ? Other Self-Care Comments  discussed community exercise classes at [facility] and [place].   ?  ? Neuro Re-ed   ? Neuro Re-ed Details  Large amplitude arm swing with sit<>stand x 10 reps, standing trunk rotation hitting T at midline, lateral weight shifting emphasizing heels down at midline mini squat x 10 reps R and L.  In standing stepping latrally, ant/posteriorly x 10 reps each direction over 4" barriers with cues on weight shifting.  Marching with UEs overhead for core engagement.   ?  ? Exercises  ? Exercises Other Exercises   ? Other Exercises  door frame stretch for R biceps, bilat pecs; standing lunges working on overhead reaching, coordination of UE movements during lunges with SBA and cues for larger amplitude motion   ? ?  ?  ? ?  ? ? ? ? ? ? ? ? ? ? ? ? PT Short Term Goals - 02/28/22 0853   ? ?  ? PT SHORT TERM GOAL #1  ? Title Patient will be independent in static postural HEP to improve alignment and enhance stability   ? Baseline has HEP and work out with reciprocal bike at gym   ? Time 4   ? Period Weeks   ? Status Partially Met   ? Target Date 01/30/22   ?  ? PT  SHORT TERM GOAL #2  ? Title Improve BLE strength to 4/5 gross strength to enhance functional activity tolerance   ? Baseline 5/5 for bilat hip flexion, knee flexion/extension, and R ankle DF.  L ankle DF is 3+/5   ? Time 4   ? Period Weeks   ? Status Partially Met   ? Target Date 01/30/22   ?  ? PT SHORT  TERM GOAL #3  ? Title Manifest improved BLE strength and balance as evidenced by time of 25 sec 5xSTS   ? Baseline 57 sec at eval, 14.02 seconds   ? Time 4   ? Period Weeks   ? Status Achieved   ? Target Date 01/30/22   ?  ? PT SHORT TERM GOAL #4  ? Title Demo bed mobility with supervision to reduce level of assistance from cargivers   ? Baseline Patient is inependent today with bed mobility   ? Time 4   ? Period Weeks   ? Status Achieved   ? Target Date 01/30/22   ? ?  ?  ? ?  ? ? ? ? PT Long Term Goals - 02/28/22 1304   ? ?  ? PT LONG TERM GOAL #1  ? Title Pt will be independent with Parkinson's specific HEP for improved posture, balance, and gait. UPDATED LONG TERM GOALS TO CONTINUE UNTIL 5/5/20203   ? Time 4   ? Period Weeks   ? Status Revised   ? Target Date 03/30/22   ?  ? PT LONG TERM GOAL #2  ? Title Demo improved safety with ambulation as evidenced by time of 20 sec TUG test   ? Time 4   ? Period Weeks   ? Status Revised   ? Target Date 03/30/22   ?  ? PT LONG TERM GOAL #3  ? Title The patient will demonstrate community surface ambulation on grass, curbs, hills without loss of balance x 400 feet.   ? Time 4   ? Period Weeks   ? Target Date 03/30/22   ?  ? PT LONG TERM GOAL #4  ? Title The patient will verbalize understanding of community exercise options for [facility]! classes and have handout for support group information.   ? Time 4   ? Period Weeks   ? Target Date 03/30/22   ?  ? PT LONG TERM GOAL #5  ? Title The patient will demonstrate floor<>stand with UE support through floor.   ? Time 4   ? Period Weeks   ? Target Date 03/30/22   ? ?  ?  ? ?  ? ? ? ? ? ? ? ? Plan - 02/28/22 1307   ? ? Clinical Impression  Statement The patient partially mat STGs and PT continuing LTGs x 4 more weeks.  The patient continues to be at increased risk for falls per recent fall on outdoor surfaces (took a mulch bank instead of a sidewalk

## 2022-03-02 ENCOUNTER — Ambulatory Visit: Payer: Commercial Managed Care - PPO | Admitting: Rehabilitative and Restorative Service Providers"

## 2022-03-02 DIAGNOSIS — M6281 Muscle weakness (generalized): Secondary | ICD-10-CM

## 2022-03-02 DIAGNOSIS — R2689 Other abnormalities of gait and mobility: Secondary | ICD-10-CM

## 2022-03-02 DIAGNOSIS — R262 Difficulty in walking, not elsewhere classified: Secondary | ICD-10-CM

## 2022-03-02 DIAGNOSIS — R2681 Unsteadiness on feet: Secondary | ICD-10-CM

## 2022-03-02 NOTE — Therapy (Signed)
[place] [facility] ?[address], [address] ?[place], [place], [address] ?Phone: [phone]   Fax:  [phone] ? ?Physical Therapy Treatment ? ?Patient Details  ?Name: Brian [name] ?MRN: [id number] ?Date of Birth: 11/15/1961 ?Referring Provider (PT): Tat, Eustace Quail, DO ? ? ?Encounter Date: 03/02/2022 ? ? PT End of Session - 03/02/22 0850   ? ? Visit Number 12   ? Number of Visits 19   ? Date for PT Re-Evaluation 03/30/22   ? Authorization Type UMR, no VL, medicial necessity review after 25th visit   ? Authorization - Visit Number 12   ? Authorization - Number of Visits 24   ? PT Start Time 0848   ? PT Stop Time 0929   ? PT Time Calculation (min) 41 min   ? Activity Tolerance Patient tolerated treatment well   ? Behavior During Therapy [facility] for tasks assessed/performed   ? ?  ?  ? ?  ? ? ?Past Medical History:  ?Diagnosis Date  ? Anxiety   ? Dysrhythmia   ? past hx pvc  ? Goals of care, counseling/discussion 09/25/2018  ? H/O autologous stem cell transplant ([place])   ? may 2014  at [place]  ? History of Bell's palsy   ? 2009  RIGHT SIDE--  HAS 80% FUNCTION / PT STATES A LITTLE ASYMETRICAL AND EFFECTS MOUTH  ? History of radiation therapy 10/18/17-11/05/17  ? sprine T1 26 Gy in 13 fractions, spine boost 10 Gy in 5 fractions  ? Hodgkin's disease, nodular sclerosis, of inguinal region/lower limb ([place]) ONOLOGIST--  DR Marin Olp AND A [facility]  ?   SALVAGE CHEMO 2013/  AUTOLOGUS STEM CELL TRANSPLANT MAY 2014 AT [facility]  ? Mass of right chest wall 09/18/2018  ? Mass of right inguinal region   ? PVC (premature ventricular contraction)   ? "benign"  ? TIA (transient ischemic attack) 02/2015  ? "probable TIA"  ? Wears contact lenses   ? ? ?Past Surgical History:  ?Procedure Laterality Date  ? AXILLARY LYMPH NODE BIOPSY Left 02/02/2013  ? Procedure: NEEDLE LOCALIZED AXILLARY LYMPH NODE BIOPSY;  Surgeon: Haywood Lasso, MD;  Location: [place];  Service: General;  Laterality: Left;  ? BACK  SURGERY  03/2021  ? BONE MARROW BIOPSY  08/26/2012  ? CYST REMOVAL NECK Left 11/26/1978  ? LEFT INGUINAL LYMPH NODE BX  09/09/2012  ? LYMPH NODE BIOPSY N/A 07/28/2015  ? Procedure: LYMPH NODE BIOPSY;  Surgeon: Melrose Nakayama, MD;  Location: [place];  Service: Thoracic;  Laterality: N/A;  ? MASS EXCISION Right 09/19/2018  ? Procedure: EXCISION OF RIGHT CHEST WALL MASS ERAS PATHWAY;  Surgeon: Fanny Skates, MD;  Location: [place];  Service: General;  Laterality: Right;  ? NODE DISSECTION Right 07/28/2015  ? Procedure: NODE DISSECTION;  Surgeon: Melrose Nakayama, MD;  Location: [facility];  Service: Thoracic;  Laterality: Right;  ? PLEURA BIOPSY Left 05/31/2014  ? REMOVAL RIGHT INGUINAL LYMPH NODES  08/16/2011  ? SCROTAL EXPLORATION Right 01/04/2014  ? 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;  Service: Urology;  Laterality: Right;  ? TRANSTHORACIC ECHOCARDIOGRAM  03/18/2013  ? MILD LVH/  EF 55-60%  ? VIDEO ASSISTED THORACOSCOPY Left 05/31/2014  ? Procedure: LEFT VIDEO ASSISTED THORACOSCOPY, PLEURAL BIOPSY;  Surgeon: Melrose Nakayama, MD;  Location: [place];  Service: Thoracic;  Laterality: Left;  ? VIDEO ASSISTED THORACOSCOPY Right 07/28/2015  ? Procedure: RIGHT VIDEO ASSISTED THORACOSCOPY;  Surgeon: Melrose Nakayama, MD;  Location: [place];  Service: Thoracic;  Laterality: Right;  ? VIDEO ASSISTED THORACOSCOPY (VATS)/ LYMPH NODE SAMPLING Right 07/28/2015  ? VIDEO ASSISTED THORACOSCOPY (VATS)/WEDGE RESECTION  05/31/2014  ? VIDEO BRONCHOSCOPY WITH ENDOBRONCHIAL ULTRASOUND  07/28/2015  ? VIDEO BRONCHOSCOPY WITH ENDOBRONCHIAL ULTRASOUND N/A 07/28/2015  ? Procedure: VIDEO BRONCHOSCOPY WITH ENDOBRONCHIAL ULTRASOUND;  Surgeon: Melrose Nakayama, MD;  Location: [place];  Service: Thoracic;  Laterality: N/A;  ? ? ?There were no vitals filed for this  visit. ? ? Subjective Assessment - 03/02/22 0850   ? ? Subjective The patient has typical soreness and stiffness this morning.   ? Pertinent History right shoulder injury from broken humerus and rotator cuff tear, recent hx of Hodgkins lymphoma, lumbar surgery   ? Patient Stated Goals Be more active, get stronger, be able to get in/out of bed easier, reduce freezing when walking   ? Currently in Pain? No/denies   ? ?  ?  ? ?  ? ? ? ? ? ? ? ? ? ? ? ? ? ? ? ? ? ? ? ? [facility] Adult PT Treatment/Exercise - 03/02/22 0854   ? ?  ? Ambulation/Gait  ? Ambulation/Gait Yes   ? Gait Comments heel/toe walking x 30 feet x 3 reps.   ?  ? Neuro Re-ed   ? Neuro Re-ed Details  Seated warm up with T<>both sides clapping across body x 5 reps, seated elbow lean for trunk stretch, tall knee to quadriped exaggerating arm motion. Quadriped to 1/2 kneeling with cues on large amplitude motion and posture upright x 5 reps each side.  Standing motions stepping back for posterior weight shift <>return to midline and stepping forward with return to midline alternating LEs. Single limb stance activities working on foot positioning.   ?  ? Exercises  ? Exercises Other Exercises;Lumbar;Shoulder   ? Other Exercises  Lumbar stretch with quadriped thread the needle x 5 reps R and L. High knee marching.   ?  ? Lumbar Exercises: Stretches  ? Lower Trunk Rotation 2 reps;20 seconds   ? Other Lumbar Stretch Exercise supine piriformis stretch   ? Other Lumbar Stretch Exercise standing lumbar extension at countertop   ?  ? Shoulder Exercises: Supine  ? Horizontal ABduction AROM;PROM;Right;5 reps   ? Horizontal ABduction Limitations PT worked on stretching into horizontal abduction   ? Flexion PROM;Right;5 reps   ? Flexion Limitations for end range stretch   ?  ? Shoulder Exercises: Standing  ? Other Standing Exercises standing T against a wall with trunk rotation   ? ?  ?  ? ?  ? ? ? ? ? ? ? ? ? ? ? ? PT Short Term Goals - 02/28/22 0853   ? ?  ? PT SHORT TERM  GOAL #1  ? Title Patient will be independent in static postural HEP to improve alignment and enhance stability   ? Baseline has HEP and work out with reciprocal bike at gym   ? Time 4   ? Period Weeks   ? Status Partially Met   ? Target Date 01/30/22   ?  ? PT SHORT TERM GOAL #2  ? Title Improve BLE strength to 4/5 gross strength to enhance functional activity tolerance   ? Baseline 5/5 for bilat hip flexion, knee flexion/extension, and R ankle DF.  L ankle  DF is 3+/5   ? Time 4   ? Period Weeks   ? Status Partially Met   ? Target Date 01/30/22   ?  ? PT SHORT TERM GOAL #3  ? Title Manifest improved BLE strength and balance as evidenced by time of 25 sec 5xSTS   ? Baseline 57 sec at eval, 14.02 seconds   ? Time 4   ? Period Weeks   ? Status Achieved   ? Target Date 01/30/22   ?  ? PT SHORT TERM GOAL #4  ? Title Demo bed mobility with supervision to reduce level of assistance from cargivers   ? Baseline Patient is inependent today with bed mobility   ? Time 4   ? Period Weeks   ? Status Achieved   ? Target Date 01/30/22   ? ?  ?  ? ?  ? ? ? ? PT Long Term Goals - 02/28/22 1304   ? ?  ? PT LONG TERM GOAL #1  ? Title Pt will be independent with Parkinson's specific HEP for improved posture, balance, and gait. UPDATED LONG TERM GOALS TO CONTINUE UNTIL 5/5/20203   ? Time 4   ? Period Weeks   ? Status Revised   ? Target Date 03/30/22   ?  ? PT LONG TERM GOAL #2  ? Title Demo improved safety with ambulation as evidenced by time of 20 sec TUG test   ? Time 4   ? Period Weeks   ? Status Revised   ? Target Date 03/30/22   ?  ? PT LONG TERM GOAL #3  ? Title The patient will demonstrate community surface ambulation on grass, curbs, hills without loss of balance x 400 feet.   ? Time 4   ? Period Weeks   ? Target Date 03/30/22   ?  ? PT LONG TERM GOAL #4  ? Title The patient will verbalize understanding of community exercise options for PWR! classes and have handout for support group information.   ? Time 4   ? Period Weeks   ?  Target Date 03/30/22   ?  ? PT LONG TERM GOAL #5  ? Title The patient will demonstrate floor<>stand with UE support through floor.   ? Time 4   ? Period Weeks   ? Target Date 03/30/22   ? ?  ?  ? ?  ? ? ? ? ? ?

## 2022-03-06 ENCOUNTER — Ambulatory Visit: Payer: Commercial Managed Care - PPO | Admitting: Physical Therapy

## 2022-03-06 DIAGNOSIS — M6281 Muscle weakness (generalized): Secondary | ICD-10-CM

## 2022-03-06 DIAGNOSIS — R2681 Unsteadiness on feet: Secondary | ICD-10-CM

## 2022-03-06 DIAGNOSIS — R2689 Other abnormalities of gait and mobility: Secondary | ICD-10-CM | POA: Diagnosis not present

## 2022-03-06 NOTE — Therapy (Signed)
[place] [facility] ?[address], [address] ?[place], [place], [address] ?Phone: [phone]   Fax:  [phone] ? ?Physical Therapy Treatment ? ?Patient Details  ?Name: Brian [name] ?MRN: [id number] ?Date of Birth: 1961/02/27 ?Referring Provider (PT): Tat, Eustace Quail, DO ? ? ?Encounter Date: 03/06/2022 ? ? PT End of Session - 03/06/22 0934   ? ? Visit Number 13   ? Number of Visits 19   ? Date for PT Re-Evaluation 03/30/22   ? Authorization Type UMR, no VL, medicial necessity review after 25th visit   ? Authorization - Visit Number 13   ? Authorization - Number of Visits 24   ? PT Start Time 0354   ? PT Stop Time 1017   ? PT Time Calculation (min) 42 min   ? Activity Tolerance Patient tolerated treatment well   ? Behavior During Therapy [facility] for tasks assessed/performed   ? ?  ?  ? ?  ? ? ?Past Medical History:  ?Diagnosis Date  ? Anxiety   ? Dysrhythmia   ? past hx pvc  ? Goals of care, counseling/discussion 09/25/2018  ? H/O autologous stem cell transplant ([place])   ? may 2014  at [facility]  ? History of Bell's palsy   ? 2009  RIGHT SIDE--  HAS 80% FUNCTION / PT STATES A LITTLE ASYMETRICAL AND EFFECTS MOUTH  ? History of radiation therapy 10/18/17-11/05/17  ? sprine T1 26 Gy in 13 fractions, spine boost 10 Gy in 5 fractions  ? Hodgkin's disease, nodular sclerosis, of inguinal region/lower limb ([place]) ONOLOGIST--  DR Marin Olp AND A [facility]  ?   SALVAGE CHEMO 2013/  AUTOLOGUS STEM CELL TRANSPLANT MAY 2014 AT [facility]  ? Mass of right chest wall 09/18/2018  ? Mass of right inguinal region   ? PVC (premature ventricular contraction)   ? "benign"  ? TIA (transient ischemic attack) 02/2015  ? "probable TIA"  ? Wears contact lenses   ? ? ?Past Surgical History:  ?Procedure Laterality Date  ? AXILLARY LYMPH NODE BIOPSY Left 02/02/2013  ? Procedure: NEEDLE LOCALIZED AXILLARY LYMPH NODE BIOPSY;  Surgeon: Haywood Lasso, MD;  Location: [facility];  Service: General;  Laterality: Left;  ? BACK  SURGERY  03/2021  ? BONE MARROW BIOPSY  08/26/2012  ? CYST REMOVAL NECK Left 11/26/1978  ? LEFT INGUINAL LYMPH NODE BX  09/09/2012  ? LYMPH NODE BIOPSY N/A 07/28/2015  ? Procedure: LYMPH NODE BIOPSY;  Surgeon: Melrose Nakayama, MD;  Location: [place];  Service: Thoracic;  Laterality: N/A;  ? MASS EXCISION Right 09/19/2018  ? Procedure: EXCISION OF RIGHT CHEST WALL MASS ERAS PATHWAY;  Surgeon: Fanny Skates, MD;  Location: [place];  Service: General;  Laterality: Right;  ? NODE DISSECTION Right 07/28/2015  ? Procedure: NODE DISSECTION;  Surgeon: Melrose Nakayama, MD;  Location: [place];  Service: Thoracic;  Laterality: Right;  ? PLEURA BIOPSY Left 05/31/2014  ? REMOVAL RIGHT INGUINAL LYMPH NODES  08/16/2011  ? SCROTAL EXPLORATION Right 01/04/2014  ? 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 TRANSTHORACIC ECHOCARDIOGRAM  03/18/2013  ? MILD LVH/  EF 55-60%  ? VIDEO ASSISTED THORACOSCOPY Left 05/31/2014  ? Procedure: LEFT VIDEO ASSISTED THORACOSCOPY, PLEURAL BIOPSY;  Surgeon: Melrose Nakayama, MD;  Location: [place];  Service: Thoracic;  Laterality: Left;  ? VIDEO ASSISTED THORACOSCOPY Right 07/28/2015  ? Procedure: RIGHT VIDEO ASSISTED THORACOSCOPY;  Surgeon: Melrose Nakayama, MD;  Location: [place];  Service: Thoracic;  Laterality: Right;  ? VIDEO ASSISTED THORACOSCOPY (VATS)/ LYMPH NODE SAMPLING Right 07/28/2015  ? VIDEO ASSISTED THORACOSCOPY (VATS)/WEDGE RESECTION  05/31/2014  ? VIDEO BRONCHOSCOPY WITH ENDOBRONCHIAL ULTRASOUND  07/28/2015  ? VIDEO BRONCHOSCOPY WITH ENDOBRONCHIAL ULTRASOUND N/A 07/28/2015  ? Procedure: VIDEO BRONCHOSCOPY WITH ENDOBRONCHIAL ULTRASOUND;  Surgeon: Melrose Nakayama, MD;  Location: [place];  Service: Thoracic;  Laterality: N/A;  ? ? ?There were no vitals filed for this  visit. ? ? Subjective Assessment - 03/06/22 0933   ? ? Subjective Doing pretty good today.  No other falls since the fall on the hill.   ? Pertinent History right shoulder injury from broken humerus and rotator cuff tear, recent hx of Hodgkins lymphoma, lumbar surgery   ? Patient Stated Goals Be more active, get stronger, be able to get in/out of bed easier, reduce freezing when walking   ? Currently in Pain? Yes   baseline pain in R shoulder  ? Pain Score 3    ? Pain Location Arm   ? Pain Orientation Right   ? Pain Descriptors / Indicators Aching;Dull   ? Pain Onset More than a month ago   ? Pain Frequency Intermittent   ? Aggravating Factors  from previous fall, certain movements   ? Pain Relieving Factors unsure   ? ?  ?  ? ?  ? ? ? ? ? ? ? ? ? ? ? ? ? ? ? ? ? ? ? ? ? ? ? PWR [facility]) - 03/06/22 0947   ? ? PWR! exercises Moves in supine;Moves in [place];Moves in prone   ? PWR! Up x 8 reps   ? PWR! Rock x 5 reps   ? PWR! Twist x 5 reps each side   ? PWR! Step x 5 reps each side   ? Comments Quadruped-cues for technique and amplitude   ? PWR! Up x 10 reps through shoulders; x 10 reps through hips   ? PWR! Rock x 5 reps same side, then x 5 reps opposite side   ? PWR! Twist x 10 reps each side   ? PWR! Step x 5 reps each side   ? Comments In supine:  cues for technique and amplitude   ? PWR! Up x 5 reps   ? PWR! Rock x 5 reps   ? PWR! Twist x 5 reps each side   ? PWR! Step x 5 reps each side   ? Comments Prone:  cues for technique and intensity of movement   ? ?  ?  ? ?  ? ? ? ? ? ? ? PT Education - 03/06/22 1102   ? ? Education Details Rationale of large amplitude PWR! Moves exercises in supine, quadruped, prone, benefits of consistent performance as part of HEP   ? Person(s) Educated Patient   ? Methods Explanation;Demonstration   ? Comprehension Verbalized understanding   ? ?  ?  ? ?  ? ? ? PT Short Term Goals - 02/28/22 0853   ? ?  ? PT SHORT TERM GOAL #1  ? Title Patient will be independent in static postural  HEP to improve alignment and enhance stability   ? Baseline has HEP and work out with reciprocal bike at gym   ? Time 4   ? Period Weeks   ?  Status Partially Met   ? Target Date 01/30/22   ?  ? PT SHORT TERM GOAL #2  ? Title Improve BLE strength to 4/5 gross strength to enhance functional activity tolerance   ? Baseline 5/5 for bilat hip flexion, knee flexion/extension, and R ankle DF.  L ankle DF is 3+/5   ? Time 4   ? Period Weeks   ? Status Partially Met   ? Target Date 01/30/22   ?  ? PT SHORT TERM GOAL #3  ? Title Manifest improved BLE strength and balance as evidenced by time of 25 sec 5xSTS   ? Baseline 57 sec at eval, 14.02 seconds   ? Time 4   ? Period Weeks   ? Status Achieved   ? Target Date 01/30/22   ?  ? PT SHORT TERM GOAL #4  ? Title Demo bed mobility with supervision to reduce level of assistance from cargivers   ? Baseline Patient is inependent today with bed mobility   ? Time 4   ? Period Weeks   ? Status Achieved   ? Target Date 01/30/22   ? ?  ?  ? ?  ? ? ? ? PT Long Term Goals - 02/28/22 1304   ? ?  ? PT LONG TERM GOAL #1  ? Title Pt will be independent with Parkinson's specific HEP for improved posture, balance, and gait. UPDATED LONG TERM GOALS TO CONTINUE UNTIL 5/5/20203   ? Time 4   ? Period Weeks   ? Status Revised   ? Target Date 03/30/22   ?  ? PT LONG TERM GOAL #2  ? Title Demo improved safety with ambulation as evidenced by time of 20 sec TUG test   ? Time 4   ? Period Weeks   ? Status Revised   ? Target Date 03/30/22   ?  ? PT LONG TERM GOAL #3  ? Title The patient will demonstrate community surface ambulation on grass, curbs, hills without loss of balance x 400 feet.   ? Time 4   ? Period Weeks   ? Target Date 03/30/22   ?  ? PT LONG TERM GOAL #4  ? Title The patient will verbalize understanding of community exercise options for PWR! classes and have handout for support group information.   ? Time 4   ? Period Weeks   ? Target Date 03/30/22   ?  ? PT LONG TERM GOAL #5  ? Title The  patient will demonstrate floor<>stand with UE support through floor.   ? Time 4   ? Period Weeks   ? Target Date 03/30/22   ? ?  ?  ? ?  ? ? ? ? ? ? ? ? Plan - 03/06/22 0939   ? ? Clinical Impression Statement [place]

## 2022-03-08 ENCOUNTER — Ambulatory Visit: Payer: Commercial Managed Care - PPO | Admitting: Rehabilitative and Restorative Service Providers"

## 2022-03-08 DIAGNOSIS — R262 Difficulty in walking, not elsewhere classified: Secondary | ICD-10-CM

## 2022-03-08 DIAGNOSIS — M6281 Muscle weakness (generalized): Secondary | ICD-10-CM

## 2022-03-08 DIAGNOSIS — R2681 Unsteadiness on feet: Secondary | ICD-10-CM

## 2022-03-08 DIAGNOSIS — R2689 Other abnormalities of gait and mobility: Secondary | ICD-10-CM | POA: Diagnosis not present

## 2022-03-08 NOTE — Therapy (Signed)
[facility] ?[address], [address] ?[place], [place], [address] ?Phone: [phone]   Fax:  [phone] ? ?Physical Therapy Treatment ? ?Patient Details  ?Name: Brian [name] ?MRN: [id number] ?Date of Birth: 1961/08/14 ?Referring Provider (PT): Tat, Eustace Quail, DO ? ? ?Encounter Date: 03/08/2022 ? ? PT End of Session - 03/08/22 1412   ? ? Visit Number 14   ? Number of Visits 19   ? Date for PT Re-Evaluation 03/30/22   ? Authorization Type UMR, no VL, medicial necessity review after 25th visit   ? Authorization - Visit Number 14   ? Authorization - Number of Visits 24   ? PT Start Time 1409   ? PT Stop Time 6720   ? PT Time Calculation (min) 45 min   ? Activity Tolerance Patient tolerated treatment well   ? Behavior During Therapy [facility] for tasks assessed/performed   ? ?  ?  ? ?  ? ? ?Past Medical History:  ?Diagnosis Date  ? Anxiety   ? Dysrhythmia   ? past hx pvc  ? Goals of care, counseling/discussion 09/25/2018  ? H/O autologous stem cell transplant ([place])   ? may 2014  at [place]  ? History of Bell's palsy   ? 2009  RIGHT SIDE--  HAS 80% FUNCTION / PT STATES A LITTLE ASYMETRICAL AND EFFECTS MOUTH  ? History of radiation therapy 10/18/17-11/05/17  ? sprine T1 26 Gy in 13 fractions, spine boost 10 Gy in 5 fractions  ? Hodgkin's disease, nodular sclerosis, of inguinal region/lower limb ([place]) ONOLOGIST--  DR Marin Olp AND A [facility]  ?   SALVAGE CHEMO 2013/  AUTOLOGUS STEM CELL TRANSPLANT MAY 2014 AT [facility]  ? Mass of right chest wall 09/18/2018  ? Mass of right inguinal region   ? PVC (premature ventricular contraction)   ? "benign"  ? TIA (transient ischemic attack) 02/2015  ? "probable TIA"  ? Wears contact lenses   ? ? ?Past Surgical History:  ?Procedure Laterality Date  ? AXILLARY LYMPH NODE BIOPSY Left 02/02/2013  ? Procedure: NEEDLE LOCALIZED AXILLARY LYMPH NODE BIOPSY;  Surgeon: Haywood Lasso, MD;  Location: [facility];  Service: General;  Laterality: Left;  ? BACK  SURGERY  03/2021  ? BONE MARROW BIOPSY  08/26/2012  ? CYST REMOVAL NECK Left 11/26/1978  ? LEFT INGUINAL LYMPH NODE BX  09/09/2012  ? LYMPH NODE BIOPSY N/A 07/28/2015  ? Procedure: LYMPH NODE BIOPSY;  Surgeon: Melrose Nakayama, MD;  Location: [facility] [place];  Service: Thoracic;  Laterality: N/A;  ? MASS EXCISION Right 09/19/2018  ? Procedure: EXCISION OF RIGHT CHEST WALL MASS ERAS PATHWAY;  Surgeon: Fanny Skates, MD;  Location: [facility];  Service: General;  Laterality: Right;  ? NODE DISSECTION Right 07/28/2015  ? Procedure: NODE DISSECTION;  Surgeon: Melrose Nakayama, MD;  Location: [facility];  Service: Thoracic;  Laterality: Right;  ? PLEURA BIOPSY Left 05/31/2014  ? REMOVAL RIGHT INGUINAL LYMPH NODES  08/16/2011  ? SCROTAL EXPLORATION Right 01/04/2014  ? 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 TRANSTHORACIC ECHOCARDIOGRAM  03/18/2013  ? MILD LVH/  EF 55-60%  ? VIDEO ASSISTED THORACOSCOPY Left 05/31/2014  ? Procedure: LEFT VIDEO ASSISTED THORACOSCOPY, PLEURAL BIOPSY;  Surgeon: Melrose Nakayama, MD;  Location: [facility];  Service: Thoracic;  Laterality: Left;  ? VIDEO ASSISTED THORACOSCOPY Right 07/28/2015  ? Procedure: RIGHT VIDEO ASSISTED THORACOSCOPY;  Surgeon: Melrose Nakayama, MD;  Location: [facility];  Service: Thoracic;  Laterality: Right;  ? VIDEO ASSISTED THORACOSCOPY (VATS)/ LYMPH NODE SAMPLING Right 07/28/2015  ? VIDEO ASSISTED THORACOSCOPY (VATS)/WEDGE RESECTION  05/31/2014  ? VIDEO BRONCHOSCOPY WITH ENDOBRONCHIAL ULTRASOUND  07/28/2015  ? VIDEO BRONCHOSCOPY WITH ENDOBRONCHIAL ULTRASOUND N/A 07/28/2015  ? Procedure: VIDEO BRONCHOSCOPY WITH ENDOBRONCHIAL ULTRASOUND;  Surgeon: Melrose Nakayama, MD;  Location: [facility];  Service: Thoracic;  Laterality: N/A;  ? ? ?There were no vitals filed for this  visit. ? ? Subjective Assessment - 03/08/22 1411   ? ? Subjective The hardest thing is getting up in the morning b/c of back discomfort and stiffness.   ? Pertinent History right shoulder injury from broken humerus and rotator cuff tear, recent hx of Hodgkins lymphoma, lumbar surgery   ? Patient Stated Goals Be more active, get stronger, be able to get in/out of bed easier, reduce freezing when walking   ? Currently in Pain? Yes   ? Pain Score 3    ? Pain Location Arm   ? Pain Orientation Right   ? Pain Descriptors / Indicators Aching;Dull   ? Pain Type Chronic pain   ? Pain Onset More than a month ago   ? Pain Frequency Intermittent   ? Aggravating Factors  certain movement   ? Pain Relieving Factors unsure   ? ?  ?  ? ?  ? ? ? ? ? ? ? ? ? ? ? ? ? ? ? ? ? ? ? ? [facility] Adult PT Treatment/Exercise - 03/08/22 1416   ? ?  ? Ambulation/Gait  ? Ambulation/Gait Yes   ? Pre-Gait Activities Community gait activities working on grass, hills, inclines, curbs x 1000 ft.   ? Gait Comments Marching with large amplitude motion with arm movements by side and then overhead, reciprocal UE/LE marching for arm swing, backwards walking with cues for large amplitude motions, lunges to march.   ?  ? Therapeutic Activites   ? Therapeutic Activities Other Therapeutic Activities   ? Other Therapeutic Activities Floor transfers with and without UE support through floor.  Patient able to perform independently.   ?  ? Neuro Re-ed   ? Neuro Re-ed Details  Large amplitude motions in standing for spinal twist, sit to stand with arm swing x 10 reps.   ?  ? Exercises  ? Exercises Lumbar   ?  ? Lumbar Exercises: Stretches  ? Active Hamstring Stretch Right;Left;2 reps;30 seconds   ? Single Knee to Chest Stretch Right;Left;1 rep;30 seconds   ? Lower Trunk Rotation 2 reps;20 seconds   ? Piriformis Stretch 2 reps;60 seconds   ? Piriformis Stretch Limitations supine figure 4 position   ? Other Lumbar Stretch Exercise standing lumbar extension leaning on  mat   ? ?  ?  ? ?  ? ? ? ? ? ? ? ? ? ? ? ? PT Short Term Goals - 02/28/22 0853   ? ?  ? PT SHORT TERM GOAL #1  ? Title Patient will be independent in static postural HEP to improve alignment and enhance stability   ? Baseline has HEP and work out with reciprocal bike at gym   ? Time 4   ? Period Weeks   ? Status Partially Met   ? Target Date 01/30/22   ?  ? PT SHORT TERM GOAL #  2  ? Title Improve BLE strength to 4/5 gross strength to enhance functional activity tolerance   ? Baseline 5/5 for bilat hip flexion, knee flexion/extension, and R ankle DF.  L ankle DF is 3+/5   ? Time 4   ? Period Weeks   ? Status Partially Met   ? Target Date 01/30/22   ?  ? PT SHORT TERM GOAL #3  ? Title Manifest improved BLE strength and balance as evidenced by time of 25 sec 5xSTS   ? Baseline 57 sec at eval, 14.02 seconds   ? Time 4   ? Period Weeks   ? Status Achieved   ? Target Date 01/30/22   ?  ? PT SHORT TERM GOAL #4  ? Title Demo bed mobility with supervision to reduce level of assistance from cargivers   ? Baseline Patient is inependent today with bed mobility   ? Time 4   ? Period Weeks   ? Status Achieved   ? Target Date 01/30/22   ? ?  ?  ? ?  ? ? ? ? PT Long Term Goals - 03/08/22 1445   ? ?  ? PT LONG TERM GOAL #1  ? Title Pt will be independent with Parkinson's specific HEP for improved posture, balance, and gait. UPDATED LONG TERM GOALS TO CONTINUE UNTIL 5/5/20203   ? Time 4   ? Period Weeks   ? Status Revised   ? Target Date 03/30/22   ?  ? PT LONG TERM GOAL #2  ? Title Demo improved safety with ambulation as evidenced by time of 20 sec TUG test   ? Time 4   ? Period Weeks   ? Status Revised   ? Target Date 03/30/22   ?  ? PT LONG TERM GOAL #3  ? Title The patient will demonstrate community surface ambulation on grass, curbs, hills without loss of balance x 400 feet.   ? Baseline met 03/08/22   ? Time 4   ? Period Weeks   ? Status Achieved   ? Target Date 03/30/22   ?  ? PT LONG TERM GOAL #4  ? Title The patient will  verbalize understanding of community exercise options for PWR! classes and have handout for support group information.   ? Baseline PT provided handout for [facility] PD class on Wed @ 1pm and POP support group.   ? Ti

## 2022-03-13 ENCOUNTER — Ambulatory Visit: Payer: Commercial Managed Care - PPO

## 2022-03-15 ENCOUNTER — Ambulatory Visit: Payer: Commercial Managed Care - PPO

## 2022-03-19 ENCOUNTER — Ambulatory Visit: Payer: Commercial Managed Care - PPO

## 2022-03-19 DIAGNOSIS — R2689 Other abnormalities of gait and mobility: Secondary | ICD-10-CM | POA: Diagnosis not present

## 2022-03-19 DIAGNOSIS — M6281 Muscle weakness (generalized): Secondary | ICD-10-CM

## 2022-03-19 DIAGNOSIS — R262 Difficulty in walking, not elsewhere classified: Secondary | ICD-10-CM

## 2022-03-19 DIAGNOSIS — R2681 Unsteadiness on feet: Secondary | ICD-10-CM
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
## 2022-03-21 ENCOUNTER — Ambulatory Visit: Payer: Commercial Managed Care - PPO

## 2022-03-21 DIAGNOSIS — R262 Difficulty in walking, not elsewhere classified: Secondary | ICD-10-CM

## 2022-03-21 DIAGNOSIS — M6281 Muscle weakness (generalized): Secondary | ICD-10-CM

## 2022-03-21 DIAGNOSIS — R2689 Other abnormalities of gait and mobility: Secondary | ICD-10-CM

## 2022-03-21 DIAGNOSIS — R2681 Unsteadiness on feet: Secondary | ICD-10-CM
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
## 2022-03-26 ENCOUNTER — Ambulatory Visit: Payer: Commercial Managed Care - PPO | Attending: Neurology | Admitting: Physical Therapy

## 2022-03-26 ENCOUNTER — Encounter: Payer: Self-pay | Admitting: Physical Therapy

## 2022-03-26 DIAGNOSIS — M6281 Muscle weakness (generalized): Secondary | ICD-10-CM | POA: Insufficient documentation

## 2022-03-26 DIAGNOSIS — R2681 Unsteadiness on feet: Secondary | ICD-10-CM | POA: Diagnosis present

## 2022-03-26 DIAGNOSIS — R262 Difficulty in walking, not elsewhere classified: Secondary | ICD-10-CM | POA: Diagnosis present

## 2022-03-26 DIAGNOSIS — R2689 Other abnormalities of gait and mobility: Secondary | ICD-10-CM | POA: Diagnosis present
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
## 2022-03-26 NOTE — Patient Instructions (Addendum)
[email or web address] ? ?  ? ? ? ? ?Provided link to [facility] YouTube class sight ?

## 2022-03-26 NOTE — Addendum Note (Signed)
Addended by: Rudell Cobb M on: 03/26/2022 08:26 AM ? ? Modules accepted: Orders ? ?

## 2022-03-28 ENCOUNTER — Ambulatory Visit: Payer: Commercial Managed Care - PPO | Admitting: Physical Therapy

## 2022-03-28 ENCOUNTER — Encounter: Payer: Self-pay | Admitting: Physical Therapy

## 2022-03-28 DIAGNOSIS — R262 Difficulty in walking, not elsewhere classified: Secondary | ICD-10-CM

## 2022-03-28 DIAGNOSIS — R2681 Unsteadiness on feet: Secondary | ICD-10-CM | POA: Diagnosis not present

## 2022-03-28 DIAGNOSIS — R2689 Other abnormalities of gait and mobility: Secondary | ICD-10-CM

## 2022-03-28 DIAGNOSIS — M6281 Muscle weakness (generalized): Secondary | ICD-10-CM

## 2022-03-28 NOTE — Therapy (Addendum)
?[facility] ?[address], [address] ?[place], [place], [address] ?Phone: [phone]   Fax:  [phone] ? ?Physical Therapy Treatment/Discharge Summary ? ?Patient Details  ?Name: Brian [name] ?MRN: [id number] ?Date of Birth: [birth year] ?Referring Provider (PT): Tat, Eustace Quail, DO ? ?PHYSICAL THERAPY DISCHARGE SUMMARY ? ?Visits from Start of Care: 18 ? ?Current functional level related to goals / functional outcomes: ? PT Long Term Goals - 03/28/22 1705   ? ?  ? PT LONG TERM GOAL #1  ? Title Pt will be independent with Parkinson's specific HEP for improved posture, balance, and gait. UPDATED LONG TERM GOALS TO CONTINUE UNTIL 5/5/20203   ? Baseline needs cues   ? Time 4   ? Period Weeks   ? Status Partially Met   ? Target Date 03/30/22   ?  ? PT LONG TERM GOAL #2  ? Title Demo improved safety with ambulation as evidenced by time of 20 sec TUG test   ? Time 4   ? Period Weeks   ? Status Achieved   ? Target Date 03/30/22   ?  ? PT LONG TERM GOAL #3  ? Title The patient will demonstrate community surface ambulation on grass, curbs, hills without loss of balance x 400 feet.   ? Baseline met 03/08/22   ? Time 4   ? Period Weeks   ? Status Achieved   ? Target Date 03/30/22   ?  ? PT LONG TERM GOAL #4  ? Title The patient will verbalize understanding of community exercise options for PWR! classes and have handout for support group information.   ? Baseline PT provided handout for [place] PD class on Wed @ 1pm and POP support group.   ? Time 4   ? Period Weeks   ? Status Achieved   ? Target Date 03/30/22   ?  ? PT LONG TERM GOAL #5  ? Title The patient will demonstrate floor<>stand with UE support through floor.   ? Baseline Met on 03/08/22   ? Time 4   ? Period Weeks   ? Status Achieved   ? Target Date 03/30/22   ? ?  ?  ? ?  ? ?Pt has met 4 of 5 LTGs ?  ?Remaining deficits: ?Bradykinesia, freezing episodes (worse later in the day) ?  ?Education / Equipment: ?Educated in HEP, tips to  reduce freezing episodes with gait.  ? ?Patient agrees to discharge. Patient goals were met. Patient is being discharged due to meeting the stated rehab goals.  Recommend return PT eval in 6-9 months due to progressive nature of disease process.   ? Mady Haagensen, PT ?03/28/22 5:16 PM ?Phone: [phone] ?Fax: [phone] ? ? ?Encounter Date: 03/28/2022 ? ? PT End of Session - 03/28/22 1615   ? ? Visit Number 18   ? Number of Visits 19   ? Date for PT Re-Evaluation 03/30/22   ? Authorization Type UMR, no VL, medicial necessity review after 25th visit   ? Authorization - Visit Number 18   ? Authorization - Number of Visits 24   ? PT Start Time 1615   ? PT Stop Time 6237   ? PT Time Calculation (min) 44 min   ? Activity Tolerance Patient tolerated treatment well   ? Behavior During Therapy [facility] for tasks assessed/performed   ? ?  ?  ? ?  ? ? ?Past Medical History:  ?Diagnosis Date  ? Anxiety   ?  Dysrhythmia   ? past hx pvc  ? Goals of care, counseling/discussion 09/25/2018  ? H/O autologous stem cell transplant ([place])   ? may 2014  at [facility]  ? History of Bell's palsy   ? 2009  RIGHT SIDE--  HAS 80% FUNCTION / PT STATES A LITTLE ASYMETRICAL AND EFFECTS MOUTH  ? History of radiation therapy 10/18/17-11/05/17  ? sprine T1 26 Gy in 13 fractions, spine boost 10 Gy in 5 fractions  ? Hodgkin's disease, nodular sclerosis, of inguinal region/lower limb ([place]) ONOLOGIST--  DR [facility] Olp AND A [facility]  ?   SALVAGE CHEMO 2013/  AUTOLOGUS STEM CELL TRANSPLANT MAY 2014 AT [facility]  ? Mass of right chest wall 09/18/2018  ? Mass of right inguinal region   ? PVC (premature ventricular contraction)   ? "benign"  ? TIA (transient ischemic attack) 02/2015  ? "probable TIA"  ? Wears contact lenses   ? ? ?Past Surgical History:  ?Procedure Laterality Date  ? AXILLARY LYMPH NODE BIOPSY Left 02/02/2013  ? Procedure: NEEDLE LOCALIZED AXILLARY LYMPH NODE BIOPSY;  Surgeon: Haywood Lasso, MD;  Location: [place];  Service: General;   Laterality: Left;  ? BACK SURGERY  03/2021  ? BONE MARROW BIOPSY  08/26/2012  ? CYST REMOVAL NECK Left 11/26/1978  ? LEFT INGUINAL LYMPH NODE BX  09/09/2012  ? LYMPH NODE BIOPSY N/A 07/28/2015  ? Procedure: LYMPH NODE BIOPSY;  Surgeon: Melrose Nakayama, MD;  Location: [place];  Service: Thoracic;  Laterality: N/A;  ? MASS EXCISION Right 09/19/2018  ? Procedure: EXCISION OF RIGHT CHEST WALL MASS ERAS PATHWAY;  Surgeon: Fanny Skates, MD;  Location: [place];  Service: General;  Laterality: Right;  ? NODE DISSECTION Right 07/28/2015  ? Procedure: NODE DISSECTION;  Surgeon: Melrose Nakayama, MD;  Location: [place];  Service: Thoracic;  Laterality: Right;  ? PLEURA BIOPSY Left 05/31/2014  ? REMOVAL RIGHT INGUINAL LYMPH NODES  08/16/2011  ? SCROTAL EXPLORATION Right 01/04/2014  ? Procedure: SCROTUM EXPLORATION   INGUINAL , EXCISION OF CYSTIC MASS OF RIGHT SPERMATIC CORD, WITH FROZEN SECTION;  Surgeon: Franchot Gallo, MD;  Location: [facility];  Service: Urology;  Laterality: Right;  ? TRANSTHORACIC ECHOCARDIOGRAM  03/18/2013  ? MILD LVH/  EF 55-60%  ? VIDEO ASSISTED THORACOSCOPY Left 05/31/2014  ? Procedure: LEFT VIDEO ASSISTED THORACOSCOPY, PLEURAL BIOPSY;  Surgeon: Melrose Nakayama, MD;  Location: [place];  Service: Thoracic;  Laterality: Left;  ? VIDEO ASSISTED THORACOSCOPY Right 07/28/2015  ? Procedure: RIGHT VIDEO ASSISTED THORACOSCOPY;  Surgeon: Melrose Nakayama, MD;  Location: [place];  Service: Thoracic;  Laterality: Right;  ? VIDEO ASSISTED THORACOSCOPY (VATS)/ LYMPH NODE SAMPLING Right 07/28/2015  ? VIDEO ASSISTED THORACOSCOPY (VATS)/WEDGE RESECTION  05/31/2014  ? VIDEO BRONCHOSCOPY WITH ENDOBRONCHIAL ULTRASOUND  07/28/2015  ? VIDEO BRONCHOSCOPY WITH ENDOBRONCHIAL ULTRASOUND N/A 07/28/2015  ? Procedure: VIDEO BRONCHOSCOPY WITH ENDOBRONCHIAL ULTRASOUND;  Surgeon: Melrose Nakayama, MD;  Location: [place];  Service: Thoracic;  Laterality: N/A;  ? ? ?There were no  vitals filed for this visit. ? ? Subjective Assessment - 03/28/22 1616   ? ? Subjective This time of day is a little harder moving and more unsteady.   ? Pertinent History right shoulder injury from broken humerus and rotator cuff tear, recent hx of Hodgkins lymphoma, lumbar surgery   ? Patient Stated Goals Be more active, get stronger, be able to get in/out of bed easier, reduce freezing when walking   ?  Currently in Pain? No/denies   ? Pain Onset More than a month ago   ? ?  ?  ? ?  ? ? ? ? ? ? ? ? ? ? ? ? ? ? ? ? ? ? ? ? [facility] Adult PT Treatment/Exercise - 03/28/22 0001   ? ?  ? Transfers  ? Transfers Sit to Stand;Stand to Sit   ? Sit to Stand 6: Modified independent (Device/Increase time);Without upper extremity assist;From chair/3-in-1   ? Stand to Sit 6: Modified independent (Device/Increase time);Without upper extremity assist;To chair/3-in-1   ? Comments 3 sets x 10 reps, from mat surface,for aerobic activity   ?  ? Ambulation/Gait  ? Ambulation/Gait Yes   ? Ambulation/Gait Assistance 5: Supervision   ? Ambulation Distance (Feet) 60 Feet   x 6  ? Assistive device None   ? Gait Pattern Step-through pattern;Narrow base of support;Poor foot clearance - left;Poor foot clearance - right   ? Ambulation Surface Level;Indoor   ?  ? Exercises  ? Other Exercises  Alt step taps to 4" step x 10 reps, then added alt arm lifts x 10 reps, then step taps to 8" step x 10 reps.  Forward step ups (step up/up, down/down) x 10 reps, 2 sets for aerobic warm up activity.   ? ?  ?  ? ?  ? ? ?Pt performs PWR! Moves in quadruped position x 10 reps ?  ?PWR! Up for improved posture ? ?PWR! Rock for improved weighshifting ? ?PWR! Twist for improved trunk rotation -moved to modified quadruped at mat table, due to increased difficulty of movement patterns ? ?PWR! Step for improved step initiation -modified quadruped position ? ?Cues provided for technique, increased amplitude of movement patterns.  Pt with significant bradykinesia in  session activities today. ? ?Pt performs PWR! Moves in standing position x 5 reps ?  ?PWR! Up for improved posture ? ?PWR! Rock for improved weighshifting ? ?PWR! Twist for improved trunk rotation  ? ?PWR! Step for i

## 2022-04-02 ENCOUNTER — Ambulatory Visit: Payer: Commercial Managed Care - PPO

## 2022-04-06 ENCOUNTER — Other Ambulatory Visit: Payer: Self-pay

## 2022-04-06 ENCOUNTER — Other Ambulatory Visit: Payer: Self-pay | Admitting: Orthopaedic Surgery

## 2022-04-06 DIAGNOSIS — M75101 Unspecified rotator cuff tear or rupture of right shoulder, not specified as traumatic: Secondary | ICD-10-CM

## 2022-04-10 ENCOUNTER — Telehealth: Payer: Self-pay | Admitting: Family Medicine

## 2022-04-10 NOTE — Telephone Encounter (Signed)
Pt would like to discuss MRI question with copland. Please advise  ?

## 2022-04-11 NOTE — Telephone Encounter (Signed)
Called pt back- he is seeing ortho for his shoulder problem. He may also need an MRI for his back- he was not sure if this was safe.  Advised MRI is not radiation so this is ok  ?

## 2022-04-11 NOTE — Telephone Encounter (Signed)
Please advise 

## 2022-04-12 ENCOUNTER — Encounter: Payer: Self-pay | Admitting: Hematology & Oncology

## 2022-04-18 ENCOUNTER — Other Ambulatory Visit: Payer: Commercial Managed Care - PPO

## 2022-04-20 ENCOUNTER — Ambulatory Visit
Admission: RE | Admit: 2022-04-20 | Discharge: 2022-04-20 | Disposition: A | Discharge status: Home / Self Care | Payer: Commercial Managed Care - PPO | Source: Ambulatory Visit | Attending: Family Medicine | Admitting: Family Medicine

## 2022-04-20 DIAGNOSIS — S42294A Other nondisplaced fracture of upper end of right humerus, initial encounter for closed fracture: Secondary | ICD-10-CM

## 2022-04-20 DIAGNOSIS — S46011A Strain of muscle(s) and tendon(s) of the rotator cuff of right shoulder, initial encounter: Secondary | ICD-10-CM

## 2022-04-24 ENCOUNTER — Other Ambulatory Visit: Payer: Commercial Managed Care - PPO

## 2022-05-02 ENCOUNTER — Encounter: Payer: Self-pay | Admitting: Hematology & Oncology

## 2022-05-07 ENCOUNTER — Ambulatory Visit (HOSPITAL_COMMUNITY)
Admission: RE | Admit: 2022-05-07 | Discharge: 2022-05-07 | Disposition: A | Discharge status: Home / Self Care | Payer: Commercial Managed Care - PPO | Source: Ambulatory Visit | Attending: Hematology & Oncology | Admitting: Hematology & Oncology

## 2022-05-07 DIAGNOSIS — C8195 Hodgkin lymphoma, unspecified, lymph nodes of inguinal region and lower limb: Secondary | ICD-10-CM | POA: Diagnosis present

## 2022-05-07 LAB — GLUCOSE, CAPILLARY: Glucose-Capillary: 97 mg/dL (ref 70–99)

## 2022-05-07 MED ORDER — FLUDEOXYGLUCOSE F - 18 (FDG) INJECTION
9.2000 | Freq: Once | INTRAVENOUS | Status: AC
  Administered 2022-05-07: 9 via INTRAVENOUS

## 2022-05-08 ENCOUNTER — Encounter: Payer: Self-pay | Admitting: Hematology & Oncology

## 2022-05-09 ENCOUNTER — Other Ambulatory Visit: Payer: Self-pay

## 2022-05-09 ENCOUNTER — Inpatient Hospital Stay: Payer: Commercial Managed Care - PPO | Attending: Hematology & Oncology

## 2022-05-09 ENCOUNTER — Inpatient Hospital Stay (HOSPITAL_BASED_OUTPATIENT_CLINIC_OR_DEPARTMENT_OTHER): Payer: Commercial Managed Care - PPO | Admitting: Hematology & Oncology

## 2022-05-09 ENCOUNTER — Other Ambulatory Visit: Payer: Self-pay | Admitting: Oncology

## 2022-05-09 ENCOUNTER — Encounter: Payer: Self-pay | Admitting: Hematology & Oncology

## 2022-05-09 VITALS — BP 142/83 | HR 90 | Temp 97.7°F | Resp 20 | Wt 180.0 lb

## 2022-05-09 DIAGNOSIS — G20 Parkinson's disease: Secondary | ICD-10-CM | POA: Diagnosis not present

## 2022-05-09 DIAGNOSIS — C8199 Hodgkin lymphoma, unspecified, extranodal and solid organ sites: Secondary | ICD-10-CM | POA: Insufficient documentation

## 2022-05-09 DIAGNOSIS — C8195 Hodgkin lymphoma, unspecified, lymph nodes of inguinal region and lower limb: Secondary | ICD-10-CM

## 2022-05-09 DIAGNOSIS — E032 Hypothyroidism due to medicaments and other exogenous substances: Secondary | ICD-10-CM

## 2022-05-09 LAB — CMP (CANCER CENTER ONLY)
ALT: <5 U/L (ref 0–44)
AST: 16 U/L (ref 15–41)
Albumin: 4.5 g/dL (ref 3.5–5.0)
Alkaline Phosphatase: 86 U/L (ref 38–126)
Anion gap: 7 (ref 5–15)
BUN: 43 mg/dL — ABNORMAL HIGH (ref 8–23)
CO2: 26 mmol/L (ref 22–32)
Calcium: 9.5 mg/dL (ref 8.9–10.3)
Chloride: 109 mmol/L (ref 98–111)
Creatinine: 1.31 mg/dL — ABNORMAL HIGH (ref 0.61–1.24)
GFR, Estimated: >60 mL/min (ref >60)
Glucose, Bld: 129 mg/dL — ABNORMAL HIGH (ref 70–99)
Potassium: 4.4 mmol/L (ref 3.5–5.1)
Sodium: 142 mmol/L (ref 135–145)
Total Bilirubin: 0.4 mg/dL (ref 0.3–1.2)
Total Protein: 6.4 g/dL — ABNORMAL LOW (ref 6.5–8.1)

## 2022-05-09 LAB — CBC WITH DIFFERENTIAL (CANCER CENTER ONLY)
Abs Immature Granulocytes: 0.03 10*3/uL (ref 0.00–0.07)
Basophils Absolute: 0.1 10*3/uL (ref 0.0–0.1)
Basophils Relative: 1 %
Eosinophils Absolute: 0.2 10*3/uL (ref 0.0–0.5)
Eosinophils Relative: 3 %
HCT: 38.8 % — ABNORMAL LOW (ref 39.0–52.0)
Hemoglobin: 12.6 g/dL — ABNORMAL LOW (ref 13.0–17.0)
Immature Granulocytes: 1 %
Lymphocytes Relative: 13 %
Lymphs Abs: 0.8 10*3/uL (ref 0.7–4.0)
MCH: 31.7 pg (ref 26.0–34.0)
MCHC: 32.5 g/dL (ref 30.0–36.0)
MCV: 97.7 fL (ref 80.0–100.0)
Monocytes Absolute: 0.6 10*3/uL (ref 0.1–1.0)
Monocytes Relative: 10 %
Neutro Abs: 4.4 10*3/uL (ref 1.7–7.7)
Neutrophils Relative %: 72 %
Platelet Count: 254 10*3/uL (ref 150–400)
RBC: 3.97 MIL/uL — ABNORMAL LOW (ref 4.22–5.81)
RDW: 12.9 % (ref 11.5–15.5)
WBC Count: 6 10*3/uL (ref 4.0–10.5)
nRBC: 0 % (ref 0.0–0.2)

## 2022-05-09 LAB — LACTATE DEHYDROGENASE: LDH: 186 U/L (ref 98–192)

## 2022-05-09 NOTE — Progress Notes (Signed)
Hematology and Oncology Follow Up Visit  GIORGI DEBRUIN [id number] [birth year] 61 y.o. 05/09/2022   Principle Diagnosis:  Recurrent Hodgkin's Disease -  S/p CAR-T therapy  TIA-resolved  Current Therapy:  Nivolumab q 3 month -- s/p cycle #18 -- on hold       Interim History:  Mr.  Navarrette is in for follow-up.  Unfortunately, he had a PET scan done yesterday.  Surprisingly, the seem to show that he has had another recurrence.  On the PET scan, he had a 7 mm lymph node in the right inferior axillary area.  This had an SUV of 5.5.  Also noted was a 7 mm nodule in the subcutaneous tissue in his right gluteal area.  Given his history of recurrences, we really have to think that this may be another recurrence.  As such, going to have to try to get this lymph node removed.  I think this would be the most likely site for his recurrence and a diagnosis.  Again, this is a very small recurrence, if this is truly a recurrence.  I reviewed this with he and his girlfriend.  I will have to speak with Dr. Roxan Hockey of thoracic surgery.  I will see if he might be able to get a biopsy for [place] to remove the right axillary lymph node.  Mr. Bingley is still troubled by his Parkinson's.  Again, I really have to think that the Parkinson's is a consequence of the CAR-T therapy that he had probably 8 years ago.  If he does have recurrence, I will have to speak with the oncologist at [facility] where he has been treated in the past.  He has had no cough.  He has had no fever.  He has had no swelling.  He has had no nausea or vomiting.  There is been no change in bowel or bladder habits.  Overall, I would say that his performance status is probably ECOG 2.    Medications:  Current Outpatient Medications:    acetaminophen (TYLENOL) 500 MG tablet, Take by mouth every 8 (eight) hours as needed., Disp: , Rfl:    carbidopa-levodopa (SINEMET CR) 50-200 MG tablet, Take 1 tablet by mouth at bedtime., Disp: 90 tablet,  Rfl: 1   carbidopa-levodopa (SINEMET IR) 25-100 MG tablet, 2 tablets at 7 AM, 2 tablets at 11 AM, 2 tablet at 3 PM, 2 tablet at 7 PM, Disp: 720 tablet, Rfl: 1   olmesartan (BENICAR) 20 MG tablet, Take 1 tablet (20 mg total) by mouth daily., Disp: 30 tablet, Rfl: 3   pramipexole (MIRAPEX) 0.5 MG tablet, Take 1 tablet (0.5 mg total) by mouth 3 (three) times daily., Disp: 270 tablet, Rfl: 1   cholecalciferol (VITAMIN D3) 25 MCG (1000 UNIT) tablet, Take 1,000 Units by mouth daily., Disp: , Rfl:    HYDROcodone-acetaminophen (NORCO/VICODIN) 5-325 MG tablet, Take 1 tablet by mouth every 8 (eight) hours as needed., Disp: 15 tablet, Rfl: 0   ibuprofen (ADVIL) 200 MG tablet, Take by mouth. (Patient not taking: Reported on 05/09/2022), Disp: , Rfl:    lidocaine (LIDODERM) 5 %, Place 1 patch onto the skin daily. Remove & Discard patch within 12 hours or as directed by MD, Disp: 30 patch, Rfl: 0   oxyCODONE-acetaminophen (PERCOCET) 10-325 MG tablet, take 1 tablet by oral route  every 6 hours as needed for moderate or severe pain, Disp: , Rfl:    tamsulosin (FLOMAX) 0.4 MG CAPS capsule, Take 1 capsule (0.4 mg total) by  mouth daily after supper., Disp: 30 capsule, Rfl: 3  Allergies: No Known Allergies  Past Medical History, Surgical history, Social history, and Family History were reviewed and updated.  Review of Systems: Review of Systems  Neurological:  Positive for tremors.  All other systems reviewed and are negative.    Physical Exam:  weight is 180 lb (81.6 kg). His oral temperature is 97.7 F (36.5 C). His blood pressure is 142/83 (abnormal) and his pulse is 90. His respiration is 20 and oxygen saturation is 97%.   Physical Exam Vitals reviewed.  HENT:     Head: Normocephalic and atraumatic.  Eyes:     Pupils: Pupils are equal, round, and reactive to light.  Cardiovascular:     Rate and Rhythm: Normal rate and regular rhythm.     Heart sounds: Normal heart sounds.  Pulmonary:     Effort:  Pulmonary effort is normal.     Breath sounds: Normal breath sounds.  Abdominal:     General: Bowel sounds are normal.     Palpations: Abdomen is soft.  Musculoskeletal:        General: No tenderness or deformity. Normal range of motion.     Cervical back: Normal range of motion.  Lymphadenopathy:     Cervical: No cervical adenopathy.  Skin:    General: Skin is warm and dry.     Findings: No erythema or rash.  Neurological:     Mental Status: He is alert and oriented to person, place, and time.  Psychiatric:        Behavior: Behavior normal.        Thought Content: Thought content normal.        Judgment: Judgment normal.     Lab Results  Component Value Date   WBC 6.0 05/09/2022   HGB 12.6 (L) 05/09/2022   HCT 38.8 (L) 05/09/2022   MCV 97.7 05/09/2022   PLT 254 05/09/2022     Chemistry      Component Value Date/Time   NA 142 05/09/2022 1529   NA 142 04/02/2017 0000   NA 140 07/19/2016 1305   K 4.4 05/09/2022 1529   K 4.4 07/19/2016 1305   CL 109 05/09/2022 1529   CL 105 02/08/2016 1117   CL 107 03/25/2013 0828   CO2 26 05/09/2022 1529   CO2 26 07/19/2016 1305   BUN 43 (H) 05/09/2022 1529   BUN 21 04/02/2017 0000   BUN 22.8 07/19/2016 1305   CREATININE 1.31 (H) 05/09/2022 1529   CREATININE 1.1 07/19/2016 1305   GLU 105 04/02/2017 0000      Component Value Date/Time   CALCIUM 9.5 05/09/2022 1529   CALCIUM 9.8 07/19/2016 1305   ALKPHOS 86 05/09/2022 1529   ALKPHOS 76 07/19/2016 1305   AST 16 05/09/2022 1529   AST 18 07/19/2016 1305   ALT <5 05/09/2022 1529   ALT 14 07/19/2016 1305   BILITOT 0.4 05/09/2022 1529   BILITOT 0.70 07/19/2016 1305      Impression and Plan: Mr. Mcfadyen is a 61 year old gentleman.  He has history of recurrent Hodgkin's disease. 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 He then had a recurrence after this.  He did undergo CAR[facility]T therapy at [facility].  I believe he had this in April 2017.  He did well with  this.  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  This was proven by biopsy.  He had radiation therapy for this.  He  now looks like there might be another recurrence.  We had him on Opdivo.  We stopped his Opdivo quite a while back.  I thought this was very reasonable.  We will have to see about another biopsy.  Again I will speak with Dr. Roxan Hockey of thoracic surgery to see if he can help [place] out.  If we do find that there is a recurrence, I may consider him for radiosurgery.  I just do not want to try to worsen his neurological status with immunotherapy.  I will try to avoid chemotherapy.  I do not think he would be a candidate for an allogenic stem cell transplant.  I suppose another CAR-T protocol could be considered.  I know this is incredibly complicated.  We first saw had to prove that he has recurrence.  This will be done with a biopsy.  We will plan to get him back depending on when we get the biopsy results.    [name], MD 6/14/20235:04 PM

## 2022-05-10 ENCOUNTER — Encounter: Payer: Self-pay | Admitting: Hematology & Oncology

## 2022-05-15 ENCOUNTER — Ambulatory Visit
Admission: RE | Admit: 2022-05-15 | Discharge: 2022-05-15 | Disposition: A | Discharge status: Home / Self Care | Payer: Commercial Managed Care - PPO | Source: Ambulatory Visit | Attending: Orthopaedic Surgery | Admitting: Orthopaedic Surgery

## 2022-05-15 ENCOUNTER — Encounter: Payer: Self-pay | Admitting: *Deleted

## 2022-05-15 DIAGNOSIS — M75101 Unspecified rotator cuff tear or rupture of right shoulder, not specified as traumatic: Secondary | ICD-10-CM

## 2022-05-16 ENCOUNTER — Other Ambulatory Visit: Payer: Self-pay | Admitting: Hematology & Oncology

## 2022-05-16 ENCOUNTER — Other Ambulatory Visit: Payer: Self-pay

## 2022-05-16 MED ORDER — CARBIDOPA-LEVODOPA ER 50-200 MG PO TBCR: 1.0000 | EXTENDED_RELEASE_TABLET | Freq: Every day | ORAL | 0 refills | Status: DC

## 2022-05-21 ENCOUNTER — Inpatient Hospital Stay: Payer: Commercial Managed Care - PPO

## 2022-05-21 ENCOUNTER — Ambulatory Visit: Payer: Commercial Managed Care - PPO | Admitting: Hematology & Oncology

## 2022-05-31 ENCOUNTER — Encounter: Payer: Commercial Managed Care - PPO | Admitting: Thoracic Surgery (Cardiothoracic Vascular Surgery)

## 2022-06-18 ENCOUNTER — Other Ambulatory Visit: Payer: Self-pay

## 2022-06-18 NOTE — Progress Notes (Unsigned)
Assessment/Plan:   1.  Parkinsons Disease  -Continue carbidopa/levodopa 25/100 and take 2 tablets at 7 AM, 2 tablets at 11 AM, 2 tablet at 3 PM, 2 tablet at 7 PM.             -Continue carbidopa/levodopa 50/200 at bedtime.             -Continue pramipexole 0.5 mg 3 times per day.  Higher dosages caused hallucinations.  -medications optimally dosed and working well right now  -Patient sought second opinion at [facility] with Dr. Jetta Lout in May, 2023.  There was no significant changes in medication.  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  Patient has symptomatic reinnervation of the right face.   3.  Hodgkin's lymphoma             -Recent PET scan suggesting recurrence.  Patient scheduled to have lymph node removal June 25, 2022.   4.  Renal insufficiency             -monitoring.  Mild.     5.  Lumbar radiculopathy             -Patient had microdiscectomy Mar 29, 2021  -Microdiscectomy complicated by subdural CSF fluid collection at L1 vertebral body and L5-S1 level.  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  -Patient seen by [facility] neurology and [place] [facility]surgery.  [facility] neurosurgery just recommended L5-S1 nerve injection. Subjective:   Brian [name] was seen today in follow up for Parkinsons disease.  My previous records were reviewed prior to todays visit as well as outside records available to me.  Pt with girlfriend who supplements the hx.   I increased his levodopa and restarted his bedtime CR levodopa last visit.  We decrease his pramipexole because of hallucinations.  That did help.  He sought a second opinion at [facility] and saw Dr. Jetta Lout on Apr 16, 2022.  Notes are reviewed.  There were no significant changes except she told him to  take 1 levodopa in the morning with ginger ale.  He reports today that he has had no falls since last november.  Dr. Antonieta Pert records have been reviewed.  He had an abnormal PET scan that suggested recurrence.  He is scheduled to have the hypermetabolic lymph node removed on July 31 with Dr. Roxan Hockey.  He brings in a handicap form and disability forms today.   He works as an Acupuncturist.  He has trouble with brain getting "mushy" and he needs to nap in the day.  He has trouble focusing.  He works at home a lot to allow for naps.  He has frequent nocturia which makes sleep inadequate.  Not having frozen feet much anymore until he gets in crowds or at a restaurant.  He is exercising.    Current prescribed movement disorder medications: carbidopa/levodopa 25/100, 2 tablets at 7 AM, 2 tablets at 11 AM, 2 tablet at 3 PM, 2 tablet at 7 PM (increase) carbidopa/levodopa 50/200 CR q hs (restarted last visit) Pramipexole 0.5 mg 3 times per day (decreased from 1 mg 3 times per day because of hallucinations)   PREVIOUS MEDICATIONS: carbidopa/levodopa 50/200  CR (d/c just because tried it for pain and still waking up with pain in back so d/c it); amantadine (he didn't want to take it and he stopped it - didn't think that dyskinesia was an issue)  ALLERGIES:  No Known Allergies  CURRENT MEDICATIONS:  Outpatient Encounter Medications as of 06/19/2022  Medication Sig   acetaminophen (TYLENOL) 500 MG tablet Take by mouth every 8 (eight) hours as needed.   carbidopa-levodopa (SINEMET CR) 50-200 MG tablet Take 1 tablet by mouth at bedtime.   carbidopa-levodopa (SINEMET IR) 25-100 MG tablet 2 tablets at 7 AM, 2 tablets at 11 AM, 2 tablet at 3 PM, 2 tablet at 7 PM   ibuprofen (ADVIL) 200 MG tablet Take by mouth.   olmesartan (BENICAR) 20 MG tablet TAKE 1 TABLET(20 MG) BY MOUTH DAILY   pramipexole (MIRAPEX) 0.5 MG tablet Take 1 tablet (0.5 mg total) by mouth 3 (three) times daily.   cholecalciferol (VITAMIN  D3) 25 MCG (1000 UNIT) tablet Take 1,000 Units by mouth daily. (Patient not taking: Reported on 06/19/2022)   HYDROcodone-acetaminophen (NORCO/VICODIN) 5-325 MG tablet Take 1 tablet by mouth every 8 (eight) hours as needed. (Patient not taking: Reported on 06/19/2022)   lidocaine (LIDODERM) 5 % Place 1 patch onto the skin daily. Remove & Discard patch within 12 hours or as directed by MD (Patient not taking: Reported on 06/19/2022)   oxyCODONE-acetaminophen (PERCOCET) 10-325 MG tablet take 1 tablet by oral route  every 6 hours as needed for moderate or severe pain (Patient not taking: Reported on 06/19/2022)   tamsulosin (FLOMAX) 0.4 MG CAPS capsule Take 1 capsule (0.4 mg total) by mouth daily after supper. (Patient not taking: Reported on 06/19/2022)   No facility-administered encounter medications on file as of 06/19/2022.    Objective:   PHYSICAL EXAMINATION:    VITALS:   Vitals:   06/19/22 1043  BP: (!) 142/88  Pulse: 83  SpO2: 94%  Weight: 182 lb (82.6 kg)  Height: '5\' 10"'$  (1.778 m)      GEN:  The patient appears stated age and is in NAD. HEENT:  Normocephalic, atraumatic.  The mucous membranes are moist. The superficial temporal arteries are without ropiness or tenderness. CV:  RRR Lungs:  CTAB Neck/HEME:  There are no carotid bruits bilaterally.  Neurological examination:  Orientation: The patient is alert and oriented x3. Cranial nerves: There is good facial symmetry with facial hypomimia. The speech is fluent and clear. Soft palate rises symmetrically and there is no tongue deviation. Hearing is intact to conversational tone. Sensation: Sensation is intact to light touch throughout Motor: Strength is at least antigravity x4.  Movement examination: Tone: There is normal tone in the normal Abnormal movements: there is bilateral LE dyskinesia Coordination:  There is no decremation with RAM's Gait and Station: The patient pushes off to arise.  Once out in the hall, he actually  does fairly well, but when he gets back to the chair he nearly misses it, but catches himself before he falls.    I have reviewed and interpreted the following labs independently    Chemistry      Component Value Date/Time   NA 142 05/09/2022 1529   NA 142 04/02/2017 0000   NA 140 07/19/2016 1305   K 4.4 05/09/2022 1529   K 4.4 07/19/2016 1305   CL 109 05/09/2022 1529   CL 105 02/08/2016 1117   CL 107 03/25/2013 0828   CO2 26 05/09/2022 1529   CO2 26 07/19/2016  1305   BUN 43 (H) 05/09/2022 1529   BUN 21 04/02/2017 0000   BUN 22.8 07/19/2016 1305   CREATININE 1.31 (H) 05/09/2022 1529   CREATININE 1.1 07/19/2016 1305   GLU 105 04/02/2017 0000      Component Value Date/Time   CALCIUM 9.5 05/09/2022 1529   CALCIUM 9.8 07/19/2016 1305   ALKPHOS 86 05/09/2022 1529   ALKPHOS 76 07/19/2016 1305   AST 16 05/09/2022 1529   AST 18 07/19/2016 1305   ALT <5 05/09/2022 1529   ALT 14 07/19/2016 1305   BILITOT 0.4 05/09/2022 1529   BILITOT 0.70 07/19/2016 1305       Lab Results  Component Value Date   WBC 6.0 05/09/2022   HGB 12.6 (L) 05/09/2022   HCT 38.8 (L) 05/09/2022   MCV 97.7 05/09/2022   PLT 254 05/09/2022    Lab Results  Component Value Date   TSH 3.484 12/08/2020     Total time spent on today's visit was 49 minutes, including both face-to-face time and nonface-to-face time.  Time included that spent on review of records (prior notes available to me/labs/imaging if pertinent), discussing treatment and goals, answering patient's questions and coordinating care.  Cc:  Copland, [name], MD

## 2022-06-19 ENCOUNTER — Encounter: Payer: Self-pay | Admitting: Neurology

## 2022-06-19 ENCOUNTER — Ambulatory Visit (INDEPENDENT_AMBULATORY_CARE_PROVIDER_SITE_OTHER): Payer: Commercial Managed Care - PPO | Admitting: Neurology

## 2022-06-19 VITALS — BP 142/88 | HR 83 | Ht 70.0 in | Wt 182.0 lb

## 2022-06-19 DIAGNOSIS — G20A1 Parkinson's disease without dyskinesia, without mention of fluctuations: Secondary | ICD-10-CM

## 2022-06-19 DIAGNOSIS — C8190 Hodgkin lymphoma, unspecified, unspecified site: Secondary | ICD-10-CM | POA: Diagnosis not present

## 2022-06-19 DIAGNOSIS — G20 Parkinson's disease: Secondary | ICD-10-CM

## 2022-06-20 ENCOUNTER — Telehealth: Payer: Self-pay

## 2022-06-20 ENCOUNTER — Other Ambulatory Visit: Payer: Self-pay

## 2022-06-20 DIAGNOSIS — Z0279 Encounter for issue of other medical certificate: Secondary | ICD-10-CM

## 2022-06-20 NOTE — Telephone Encounter (Signed)
Paperwork completed by Dr. Carles Collet. Medication list attached and copy has been made for our records. Patient needs to pay $25 fee.  Called patient to inform him that he will need to pay $25 fee and paperwork is ready for pick up. Provided patient with office hours. Paperwork placed up front.

## 2022-06-23 ENCOUNTER — Other Ambulatory Visit: Payer: Self-pay | Admitting: Neurology

## 2022-06-25 ENCOUNTER — Institutional Professional Consult (permissible substitution) (INDEPENDENT_AMBULATORY_CARE_PROVIDER_SITE_OTHER): Payer: Commercial Managed Care - PPO | Admitting: Thoracic Surgery (Cardiothoracic Vascular Surgery)

## 2022-06-25 ENCOUNTER — Encounter: Payer: Self-pay | Admitting: Thoracic Surgery (Cardiothoracic Vascular Surgery)

## 2022-06-25 ENCOUNTER — Other Ambulatory Visit: Payer: Self-pay | Admitting: *Deleted

## 2022-06-25 VITALS — BP 126/86 | HR 88 | Resp 20 | Ht 70.0 in | Wt 179.1 lb

## 2022-06-25 DIAGNOSIS — R590 Localized enlarged lymph nodes: Secondary | ICD-10-CM | POA: Diagnosis not present

## 2022-06-25 NOTE — Progress Notes (Signed)
[address]       [place] [address]             [phone]      HPI: Mr. Shear is sent for consultation for possible right axillary lymph node biopsy.  [name] is a 61 year old man with a history of Hodgkin's disease, autologous stem cell transplant 2014, Bell's palsy, TIA, and Parkinson's disease.  He was diagnosed with Hodgkin's disease about 10 years ago.  He was treated with chemotherapy and radiation.  He had salvage chemotherapy and then autologous stem cell transplant in May 2014.  He has been managed on Opdivo.  Of note he did develop Parkinson's possibly due to CAR-T therapy.  He recently had a follow-up visit with Dr. Marin Olp.  A PET/CT showed hypermetabolic nodules in the subcutaneous right gluteal region and also in the right axilla.  The nodule in the axilla was small but had significant hypermetabolic activity.  No other sites of disease.  Past Medical History:  Diagnosis Date   Anxiety    Dysrhythmia    past hx pvc   Goals of care, counseling/discussion 09/25/2018   H/O autologous stem cell transplant ([place])    may 2014  at [place]   History of Bell's palsy    2009  RIGHT SIDE--  HAS 80% FUNCTION / PT STATES A LITTLE ASYMETRICAL AND EFFECTS MOUTH   History of radiation therapy 10/18/17-11/05/17   sprine T1 26 Gy in 13 fractions, spine boost 10 Gy in 5 fractions   Hodgkin's disease, nodular sclerosis, of inguinal region/lower limb ([place]) ONOLOGIST--  DR [facility]NE[facility] AND [facility]     SALVAGE CHEMO 2013/  AUTOLOGUS STEM CELL TRANSPLANT MAY 2014 AT [facility]   Mass of right chest wall 09/18/2018   Mass of right inguinal region    PVC (premature ventricular contraction)    "benign"   TIA (transient ischemic attack) 02/2015   "probable TIA"   Wears contact lenses     Current Outpatient Medications  Medication Sig Dispense Refill   acetaminophen (TYLENOL) 500 MG tablet Take by mouth every 8 (eight) hours as needed.     carbidopa-levodopa (SINEMET CR) 50-200  MG tablet Take 1 tablet by mouth at bedtime. 90 tablet 0   carbidopa-levodopa (SINEMET IR) 25-100 MG tablet 2 tablets at 7 AM, 2 tablets at 11 AM, 2 tablet at 3 PM, 2 tablet at 7 PM 720 tablet 1   ibuprofen (ADVIL) 200 MG tablet Take by mouth.     olmesartan (BENICAR) 20 MG tablet TAKE 1 TABLET(20 MG) BY MOUTH DAILY 30 tablet 3   pramipexole (MIRAPEX) 0.5 MG tablet TAKE 1 TABLET(0.5 MG) BY MOUTH THREE TIMES DAILY 270 tablet 1   No current facility-administered medications for this visit.    Physical Exam Vitals reviewed.  Constitutional:      Appearance: Normal appearance.  HENT:     Head: Normocephalic and atraumatic.  Cardiovascular:     Rate and Rhythm: Normal rate and regular rhythm.     Heart sounds: Normal heart sounds.  Pulmonary:     Effort: Pulmonary effort is normal.     Breath sounds: Normal breath sounds.  Abdominal:     General: There is no distension.     Palpations: Abdomen is soft.  Lymphadenopathy:     Comments: Palpable nodule inferior right axillary area approximately 7 mm diameter firm and mobile  Skin:    General: Skin is warm and dry.  Neurological:  General: No focal deficit present.     Mental Status: He is alert and oriented to person, place, and time.     Gait: Gait abnormal (Shuffling).     Diagnostic Tests: NUCLEAR MEDICINE PET SKULL BASE TO THIGH   TECHNIQUE: 9.0 mCi F-18 FDG was injected intravenously. Full-ring PET imaging was performed from the skull base to thigh after the radiotracer. CT data was obtained and used for attenuation correction and anatomic localization.   Fasting blood glucose: 97 mg/dl   COMPARISON:  11/03/2021   FINDINGS: Mediastinal blood pool activity: SUV max 2.3   Liver activity: SUV max 2.9   NECK: No areas of abnormal hypermetabolism.   Incidental CT findings: No cervical adenopathy.   CHEST: No pulmonary parenchymal hypermetabolism. No mediastinal or hilar nodal hypermetabolism.   Inferior right  axillary node is new at 7 mm and a S.U.V. max of 5.5 on 73/4.   Incidental CT findings: Esophageal fluid level on 100/4. No thoracic adenopathy. Surgical clips along the left pleural space.   ABDOMEN/PELVIS: No abdominopelvic nodal hypermetabolism.   A subcutaneous nodule superficial the right gluteal musculature measures 7 mm and a S.U.V. max of 3.3 on 160/4. Enlarged from 5 mm and not hypermetabolic on 546/2 of the 70/35/0093 PET.   Incidental CT findings: Abdominal aortic atherosclerosis. Bilateral fluid density renal lesions which are likely cysts. The largest is in the upper pole left kidney at 3.6 cm.   Normal adrenal glands.  Mild prostatomegaly   SKELETON: Hypermetabolism about the left glenohumeral joint is likely degenerative and remote posttraumatic, when correlated with 11/03/2021 imaging. No suspicious marrow hypermetabolism.   Incidental CT findings: none   IMPRESSION: 1. Isolated inferior right axillary hypermetabolic enlarging node, suspicious for recurrent disease. 2. Enlarging right pelvic subcutaneous hypermetabolic nodule, indeterminate for subcutaneous lymphoma versus an infectious/inflammatory etiology. 3. Incidental findings, including: Esophageal air fluid level suggests dysmotility or gastroesophageal reflux. Aortic Atherosclerosis (ICD10-I70.0).     Electronically Signed   By: Abigail Miyamoto M.D.   On: 05/08/2022 14:53   I personally reviewed the PET/CT images and concur with the findings of a right axillary node and subcutaneous node in the right buttocks.  Impression: Brian [name] is a 61 year old man with a history of Hodgkin's disease, autologous stem cell transplant 2014, Bell's palsy, TIA, and Parkinson's disease.   On a recent follow-up visit with oncology he had a PET/CT which showed a right axillary lymph node and subcutaneous nodule in the right buttocks area.  Concerning for possible recurrent Hodgkin's disease.  Needs a biopsy for tissue  diagnosis.  I offered him the option of a right axillary lymph node biopsy.  It is a small node and it is palpable.  The procedure can be done under local with sedation with a very small incision.  We will plan to do it on an outpatient basis.  He understands the primary risk is infection, also small risk of seroma.  Plan: Right axillary lymph node biopsy on Friday, 06/29/2022  Melrose Nakayama, MD [facility] [phone]

## 2022-06-25 NOTE — H&P (View-Only) (Signed)
[address]       [place] [address]             [phone]      HPI: Brian [name] is sent for consultation for possible right axillary lymph node biopsy.  Brian [name] is a 61 year old man with a history of Brian [name], autologous stem cell transplant 2014, Bell's palsy, TIA, and Parkinson's [name].  He was diagnosed with Brian [name] about 10 years ago.  He was treated with chemotherapy and radiation.  He had salvage chemotherapy and then autologous stem cell transplant in May 2014.  He has been managed on Opdivo.  Of note he did develop Parkinson's possibly due to CAR-T therapy.  He recently had a follow-up visit with Dr. Marin [name].  A PET/CT showed hypermetabolic nodules in the subcutaneous right gluteal region and also in the right axilla.  The nodule in the axilla was small but had significant hypermetabolic activity.  No other sites of [name].  Past Medical History:  Diagnosis Date   Anxiety    Dysrhythmia    past hx pvc   Goals of care, counseling/discussion 09/25/2018   H/O autologous stem cell transplant ([place])    may 2014  at [facility] [place]   History of Bell's palsy    2009  RIGHT SIDE--  HAS 80% FUNCTION / PT STATES A LITTLE ASYMETRICAL AND EFFECTS MOUTH   History of radiation therapy 10/18/17-11/05/17   sprine T1 26 Gy in 13 fractions, spine boost 10 Gy in 5 fractions   Brian [name], nodular sclerosis, of inguinal region/lower limb ([place]) ONOLOGIST--  DR [facility]     SALVAGE CHEMO 2013/  AUTOLOGUS STEM CELL TRANSPLANT MAY 2014 AT [facility]   Mass of right chest wall 09/18/2018   Mass of right inguinal region    PVC (premature ventricular contraction)    "benign"   TIA (transient ischemic attack) 02/2015   "probable TIA"   Wears contact lenses     Current Outpatient Medications  Medication Sig Dispense Refill   acetaminophen (TYLENOL) 500 MG tablet Take by mouth every 8 (eight) hours as needed.     carbidopa-levodopa (SINEMET CR) 50-200  MG tablet Take 1 tablet by mouth at bedtime. 90 tablet 0   carbidopa-levodopa (SINEMET IR) 25-100 MG tablet 2 tablets at 7 AM, 2 tablets at 11 AM, 2 tablet at 3 PM, 2 tablet at 7 PM 720 tablet 1   ibuprofen (ADVIL) 200 MG tablet Take by mouth.     olmesartan (BENICAR) 20 MG tablet TAKE 1 TABLET(20 MG) BY MOUTH DAILY 30 tablet 3   pramipexole (MIRAPEX) 0.5 MG tablet TAKE 1 TABLET(0.5 MG) BY MOUTH THREE TIMES DAILY 270 tablet 1   No current facility-administered medications for this visit.    Physical Exam Vitals reviewed.  Constitutional:      Appearance: Normal appearance.  HENT:     Head: Normocephalic and atraumatic.  Cardiovascular:     Rate and Rhythm: Normal rate and regular rhythm.     Heart sounds: Normal heart sounds.  Pulmonary:     Effort: Pulmonary effort is normal.     Breath sounds: Normal breath sounds.  Abdominal:     General: There is no distension.     Palpations: Abdomen is soft.  Lymphadenopathy:     Comments: Palpable nodule inferior right axillary area approximately 7 mm diameter firm and mobile  Skin:    General: Skin is warm and dry.  Neurological:  General: No focal deficit present.     Mental Status: He is alert and oriented to person, place, and time.     Gait: Gait abnormal (Shuffling).     Diagnostic Tests: NUCLEAR MEDICINE PET SKULL BASE TO THIGH   TECHNIQUE: 9.0 mCi F-18 FDG was injected intravenously. Full-ring PET imaging was performed from the skull base to thigh after the radiotracer. CT data was obtained and used for attenuation correction and anatomic localization.   Fasting blood glucose: 97 mg/dl   COMPARISON:  11/03/2021   FINDINGS: Mediastinal blood pool activity: SUV max 2.3   Liver activity: SUV max 2.9   NECK: No areas of abnormal hypermetabolism.   Incidental CT findings: No cervical adenopathy.   CHEST: No pulmonary parenchymal hypermetabolism. No mediastinal or hilar nodal hypermetabolism.   Inferior right  axillary node is new at 7 mm and a S.U.V. max of 5.5 on 73/4.   Incidental CT findings: Esophageal fluid level on 100/4. No thoracic adenopathy. Surgical clips along the left pleural space.   ABDOMEN/PELVIS: No abdominopelvic nodal hypermetabolism.   A subcutaneous nodule superficial the right gluteal musculature measures 7 mm and a S.U.V. max of 3.3 on 160/4. Enlarged from 5 mm and not hypermetabolic on 921/1 of the 94/17/4081 PET.   Incidental CT findings: Abdominal aortic atherosclerosis. Bilateral fluid density renal lesions which are likely cysts. The largest is in the upper pole left kidney at 3.6 cm.   Normal adrenal glands.  Mild prostatomegaly   SKELETON: Hypermetabolism about the left glenohumeral joint is likely degenerative and remote posttraumatic, when correlated with 11/03/2021 imaging. No suspicious marrow hypermetabolism.   Incidental CT findings: none   IMPRESSION: 1. Isolated inferior right axillary hypermetabolic enlarging node, suspicious for recurrent [name]. 2. Enlarging right pelvic subcutaneous hypermetabolic nodule, indeterminate for subcutaneous lymphoma versus an infectious/inflammatory etiology. 3. Incidental findings, including: Esophageal air fluid level suggests dysmotility or gastroesophageal reflux. Aortic Atherosclerosis (ICD10-I70.0).     Electronically Signed   By: Brian [name] M.D.   On: 05/08/2022 14:53   I personally reviewed the PET/CT images and concur with the findings of a right axillary node and subcutaneous node in the right buttocks.  Impression: Brian [name] is a 61 year old man with a history of Brian [name], autologous stem cell transplant 2014, Bell's palsy, TIA, and Parkinson's [name].   On a recent follow-up visit with oncology he had a PET/CT which showed a right axillary lymph node and subcutaneous nodule in the right buttocks area.  Concerning for possible recurrent Brian [name].  Needs a biopsy for tissue  diagnosis.  I offered him the option of a right axillary lymph node biopsy.  It is a small node and it is palpable.  The procedure can be done under local with sedation with a very small incision.  We will plan to do it on an outpatient basis.  He understands the primary risk is infection, also small risk of seroma.  Plan: Right axillary lymph node biopsy on Friday, 06/29/2022  Melrose Nakayama, MD [facility] [phone]

## 2022-06-26 ENCOUNTER — Other Ambulatory Visit: Payer: Self-pay

## 2022-06-26 NOTE — Pre-Procedure Instructions (Signed)
Surgical Instructions    Your procedure is scheduled on Friday, August 4th.  Report to [facility] Main Entrance "A" at 10:45 A.M., then check in with the Admitting office.  Call this number if you have problems the morning of surgery:  [phone]   If you have any questions prior to your surgery date call [phone]: Open Monday-Friday 8am-4pm    Remember:  Do not eat or drink after midnight the night before your surgery     Take these medicines the morning of surgery with A SIP OF WATER  carbidopa-levodopa (SINEMET IR)  pramipexole (MIRAPEX)    If needed: acetaminophen (TYLENOL)  As of today, STOP taking any Aspirin (unless otherwise instructed by your surgeon) Aleve, Naproxen, Ibuprofen, Motrin, Advil, Goody's, BC's, all herbal medications, fish oil, and all vitamins.                     Do NOT Smoke (Tobacco/Vaping) for 24 hours prior to your procedure.  If you use a CPAP at night, you may bring your mask/headgear for your overnight stay.   Contacts, glasses, piercing's, hearing aid's, dentures or partials may not be worn into surgery, please bring cases for these belongings.    For patients admitted to the hospital, discharge time will be determined by your treatment team.   Patients discharged the day of surgery will not be allowed to drive home, and someone needs to stay with them for 24 hours.  SURGICAL WAITING ROOM VISITATION Patients having surgery or a procedure may have no more than 2 support people in the waiting area - these visitors may rotate.   Children under the age of 50 must have an adult with them who is not the patient. If the patient needs to stay at the hospital during part of their recovery, the visitor guidelines for inpatient rooms apply. Pre-op nurse will coordinate an appropriate time for 1 support person to accompany patient in pre-op.  This support person may not rotate.   Please refer to the [facility] website for the visitor guidelines for  Inpatients (after your surgery is over and you are in a regular room).    Special instructions:   [facility]- Preparing For Surgery  Before surgery, you can play an important role. Because skin is not sterile, your skin needs to be as free of germs as possible. You can reduce the number of germs on your skin by washing with CHG (chlorahexidine gluconate) Soap before surgery.  CHG is an antiseptic cleaner which kills germs and bonds with the skin to continue killing germs even after washing.    Oral Hygiene is also important to reduce your risk of infection.  Remember - BRUSH YOUR TEETH THE MORNING OF SURGERY WITH YOUR REGULAR TOOTHPASTE  Please do not use if you have an allergy to CHG or antibacterial soaps. If your skin becomes reddened/irritated stop using the CHG.  Do not shave (including legs and underarms) for at least 48 hours prior to first CHG shower. It is OK to shave your face.  Please follow these instructions carefully.   Shower the NIGHT BEFORE SURGERY and the MORNING OF SURGERY  If you chose to wash your hair, wash your hair first as usual with your normal shampoo.  After you shampoo, rinse your hair and body thoroughly to remove the shampoo.  Use CHG Soap as you would any other liquid soap. You can apply CHG directly to the skin and wash gently with a scrungie or a  clean washcloth.   Apply the CHG Soap to your body ONLY FROM THE NECK DOWN.  Do not use on open wounds or open sores. Avoid contact with your eyes, ears, mouth and genitals (private parts). Wash Face and genitals (private parts)  with your normal soap.   Wash thoroughly, paying special attention to the area where your surgery will be performed.  Thoroughly rinse your body with warm water from the neck down.  DO NOT shower/wash with your normal soap after using and rinsing off the CHG Soap.  Pat yourself dry with a CLEAN TOWEL.  Wear CLEAN PAJAMAS to bed the night before surgery  Place CLEAN SHEETS on your  bed the night before your surgery  DO NOT SLEEP WITH PETS.   Day of Surgery: Take a shower with CHG soap. Do not wear jewelry  Do not wear lotions, powders, colognes, or deodorant.  Men may shave face and neck. Do not bring valuables to the hospital. [facility] is not responsible for any belongings or valuables.  Wear Clean/Comfortable clothing the morning of surgery Remember to brush your teeth WITH YOUR REGULAR TOOTHPASTE.   Please read over the following fact sheets that you were given.    If you received a COVID test during your pre-op visit  it is requested that you wear a mask when out in public, stay away from anyone that may not be feeling well and notify your surgeon if you develop symptoms. If you have been in contact with anyone that has tested positive in the last 10 days please notify you surgeon.

## 2022-06-27 ENCOUNTER — Other Ambulatory Visit: Payer: Self-pay

## 2022-06-27 ENCOUNTER — Encounter (HOSPITAL_COMMUNITY): Payer: Self-pay

## 2022-06-27 ENCOUNTER — Ambulatory Visit (HOSPITAL_COMMUNITY)
Admission: RE | Admit: 2022-06-27 | Discharge: 2022-06-27 | Disposition: A | Discharge status: Home / Self Care | Payer: Commercial Managed Care - PPO | Source: Ambulatory Visit | Attending: Thoracic Surgery (Cardiothoracic Vascular Surgery) | Admitting: Thoracic Surgery (Cardiothoracic Vascular Surgery)

## 2022-06-27 ENCOUNTER — Encounter (HOSPITAL_COMMUNITY)
Admission: RE | Admit: 2022-06-27 | Discharge: 2022-06-27 | Disposition: A | Discharge status: Home / Self Care | Payer: Commercial Managed Care - PPO | Source: Ambulatory Visit | Attending: Thoracic Surgery (Cardiothoracic Vascular Surgery) | Admitting: Thoracic Surgery (Cardiothoracic Vascular Surgery)

## 2022-06-27 DIAGNOSIS — Z01818 Encounter for other preprocedural examination: Secondary | ICD-10-CM | POA: Diagnosis present

## 2022-06-27 DIAGNOSIS — R590 Localized enlarged lymph nodes: Secondary | ICD-10-CM | POA: Diagnosis not present

## 2022-06-27 HISTORY — DX: Essential (primary) hypertension: I10

## 2022-06-27 HISTORY — DX: Myoneural disorder, unspecified: G70.9

## 2022-06-27 LAB — COMPREHENSIVE METABOLIC PANEL
ALT: 5 U/L (ref 0–44)
AST: 20 U/L (ref 15–41)
Albumin: 4.2 g/dL (ref 3.5–5.0)
Alkaline Phosphatase: 76 U/L (ref 38–126)
Anion gap: 6 (ref 5–15)
BUN: 29 mg/dL — ABNORMAL HIGH (ref 8–23)
CO2: 28 mmol/L (ref 22–32)
Calcium: 9.6 mg/dL (ref 8.9–10.3)
Chloride: 107 mmol/L (ref 98–111)
Creatinine, Ser: 1.16 mg/dL (ref 0.61–1.24)
GFR, Estimated: >60 mL/min (ref >60)
Glucose, Bld: 100 mg/dL — ABNORMAL HIGH (ref 70–99)
Potassium: 4.5 mmol/L (ref 3.5–5.1)
Sodium: 141 mmol/L (ref 135–145)
Total Bilirubin: 0.7 mg/dL (ref 0.3–1.2)
Total Protein: 6.9 g/dL (ref 6.5–8.1)

## 2022-06-27 LAB — CBC
HCT: 40.4 % (ref 39.0–52.0)
Hemoglobin: 13.1 g/dL (ref 13.0–17.0)
MCH: 31.8 pg (ref 26.0–34.0)
MCHC: 32.4 g/dL (ref 30.0–36.0)
MCV: 98.1 fL (ref 80.0–100.0)
Platelets: 222 10*3/uL (ref 150–400)
RBC: 4.12 MIL/uL — ABNORMAL LOW (ref 4.22–5.81)
RDW: 13.2 % (ref 11.5–15.5)
WBC: 6 10*3/uL (ref 4.0–10.5)
nRBC: 0 % (ref 0.0–0.2)

## 2022-06-27 LAB — TYPE AND SCREEN
ABO/RH(D): O POS
Antibody Screen: NEGATIVE

## 2022-06-27 LAB — APTT: aPTT: 23 seconds — ABNORMAL LOW (ref 24–36)

## 2022-06-27 LAB — PROTIME-INR
INR: 1.1 (ref 0.8–1.2)
Prothrombin Time: 13.6 seconds (ref 11.4–15.2)

## 2022-06-27 NOTE — Progress Notes (Signed)
PCP - Lamar Blinks, MD Cardiologist - Dr. Johnsie Cancel (Saw in 2016 for PVCs. Nothing significant came from workup. No additional follow-up needed)  PPM/ICD - Denies Device Orders - n/a Rep Notified - n/a  Chest x-ray - 06/27/2022 EKG - 06/27/2022 Stress Test - 03/11/2015 ECHO - 03/18/2013 Cardiac Cath - Denies  Sleep Study - Denies CPAP - n/a  No DM  Blood Thinner Instructions: N/A Aspirin Instructions: Stop taking any aspirin today  NPO after midnight  COVID TEST- n/a   Anesthesia review: Yes. Cardiac history related to Hodgkins Lymphoma.   Patient denies shortness of breath, fever, cough and chest pain at PAT appointment   All instructions explained to the patient, with a verbal understanding of the material. Patient agrees to go over the instructions while at home for a better understanding. Patient also instructed to self quarantine after being tested for COVID-19. The opportunity to ask questions was provided.

## 2022-06-29 ENCOUNTER — Ambulatory Visit (HOSPITAL_BASED_OUTPATIENT_CLINIC_OR_DEPARTMENT_OTHER): Payer: Commercial Managed Care - PPO | Admitting: Anesthesiology

## 2022-06-29 ENCOUNTER — Other Ambulatory Visit: Payer: Self-pay

## 2022-06-29 ENCOUNTER — Encounter (HOSPITAL_COMMUNITY): Payer: Self-pay | Admitting: Thoracic Surgery (Cardiothoracic Vascular Surgery)

## 2022-06-29 ENCOUNTER — Ambulatory Visit (HOSPITAL_COMMUNITY)
Admission: RE | Admit: 2022-06-29 | Discharge: 2022-06-29 | Disposition: A | Discharge status: Home / Self Care | Payer: Commercial Managed Care - PPO | Source: Ambulatory Visit | Attending: Thoracic Surgery (Cardiothoracic Vascular Surgery) | Admitting: Thoracic Surgery (Cardiothoracic Vascular Surgery)

## 2022-06-29 ENCOUNTER — Ambulatory Visit (HOSPITAL_COMMUNITY): Payer: Commercial Managed Care - PPO | Admitting: Physician Assistant

## 2022-06-29 ENCOUNTER — Encounter (HOSPITAL_COMMUNITY)
Admission: RE | Disposition: A | Discharge status: Home / Self Care | Payer: Self-pay | Source: Ambulatory Visit | Attending: Thoracic Surgery (Cardiothoracic Vascular Surgery)

## 2022-06-29 DIAGNOSIS — F419 Anxiety disorder, unspecified: Secondary | ICD-10-CM | POA: Diagnosis not present

## 2022-06-29 DIAGNOSIS — I10 Essential (primary) hypertension: Secondary | ICD-10-CM | POA: Insufficient documentation

## 2022-06-29 DIAGNOSIS — R590 Localized enlarged lymph nodes: Secondary | ICD-10-CM

## 2022-06-29 DIAGNOSIS — Z923 Personal history of irradiation: Secondary | ICD-10-CM | POA: Diagnosis not present

## 2022-06-29 DIAGNOSIS — Z9221 Personal history of antineoplastic chemotherapy: Secondary | ICD-10-CM | POA: Insufficient documentation

## 2022-06-29 DIAGNOSIS — Z8673 Personal history of transient ischemic attack (TIA), and cerebral infarction without residual deficits: Secondary | ICD-10-CM

## 2022-06-29 DIAGNOSIS — T50995A Adverse effect of other drugs, medicaments and biological substances, initial encounter: Secondary | ICD-10-CM | POA: Insufficient documentation

## 2022-06-29 DIAGNOSIS — Z9484 Stem cells transplant status: Secondary | ICD-10-CM | POA: Insufficient documentation

## 2022-06-29 DIAGNOSIS — G2119 Other drug induced secondary parkinsonism: Secondary | ICD-10-CM | POA: Diagnosis not present

## 2022-06-29 DIAGNOSIS — R599 Enlarged lymph nodes, unspecified: Secondary | ICD-10-CM | POA: Diagnosis not present

## 2022-06-29 DIAGNOSIS — C8194 Hodgkin lymphoma, unspecified, lymph nodes of axilla and upper limb: Secondary | ICD-10-CM | POA: Diagnosis not present

## 2022-06-29 HISTORY — PX: LYMPH NODE BIOPSY: SHX201

## 2022-06-29 SURGERY — LYMPH NODE BIOPSY
Anesthesia: Monitor Anesthesia Care | Site: Axilla | Laterality: Right

## 2022-06-29 MED ORDER — ORAL CARE MOUTH RINSE: 15.0000 mL | Freq: Once | OROMUCOSAL | Status: AC

## 2022-06-29 MED ORDER — FENTANYL CITRATE (PF) 250 MCG/5ML IJ SOLN
INTRAMUSCULAR | Status: DC | PRN
  Administered 2022-06-29: 50 ug via INTRAVENOUS

## 2022-06-29 MED ORDER — CEFAZOLIN SODIUM-DEXTROSE 2-4 GM/100ML-% IV SOLN
2.0000 g | INTRAVENOUS | Status: AC
  Administered 2022-06-29: 2 g via INTRAVENOUS
  Filled 2022-06-29: qty 100

## 2022-06-29 MED ORDER — ONDANSETRON HCL 4 MG/2ML IJ SOLN
INTRAMUSCULAR | Status: AC
  Filled 2022-06-29: qty 2

## 2022-06-29 MED ORDER — ACETAMINOPHEN 500 MG PO TABS
1000.0000 mg | ORAL_TABLET | Freq: Once | ORAL | Status: AC
  Administered 2022-06-29: 1000 mg via ORAL
  Filled 2022-06-29: qty 2

## 2022-06-29 MED ORDER — FENTANYL CITRATE (PF) 250 MCG/5ML IJ SOLN
INTRAMUSCULAR | Status: AC
  Filled 2022-06-29: qty 5

## 2022-06-29 MED ORDER — FENTANYL CITRATE (PF) 100 MCG/2ML IJ SOLN: 25.0000 ug | INTRAMUSCULAR | Status: DC | PRN

## 2022-06-29 MED ORDER — LIDOCAINE HCL (PF) 1 % IJ SOLN
INTRAMUSCULAR | Status: AC
  Filled 2022-06-29: qty 30

## 2022-06-29 MED ORDER — MIDAZOLAM HCL 2 MG/2ML IJ SOLN
INTRAMUSCULAR | Status: AC
  Filled 2022-06-29: qty 2

## 2022-06-29 MED ORDER — MEPERIDINE HCL 25 MG/ML IJ SOLN: 6.2500 mg | INTRAMUSCULAR | Status: DC | PRN

## 2022-06-29 MED ORDER — PROPOFOL 500 MG/50ML IV EMUL
INTRAVENOUS | Status: DC | PRN
  Administered 2022-06-29: 100 ug/kg/min via INTRAVENOUS

## 2022-06-29 MED ORDER — OXYCODONE HCL 5 MG PO TABS: 5.0000 mg | ORAL_TABLET | Freq: Once | ORAL | Status: DC | PRN

## 2022-06-29 MED ORDER — 0.9 % SODIUM CHLORIDE (POUR BTL) OPTIME
TOPICAL | Status: DC | PRN
  Administered 2022-06-29: 1000 mL

## 2022-06-29 MED ORDER — MIDAZOLAM HCL 2 MG/2ML IJ SOLN
INTRAMUSCULAR | Status: DC | PRN
  Administered 2022-06-29: 2 mg via INTRAVENOUS

## 2022-06-29 MED ORDER — TRAMADOL HCL 50 MG PO TABS: 50.0000 mg | ORAL_TABLET | Freq: Four times a day (QID) | ORAL | 0 refills | Status: DC | PRN

## 2022-06-29 MED ORDER — TRAMADOL HCL 50 MG PO TABS: 50.0000 mg | ORAL_TABLET | Freq: Four times a day (QID) | ORAL | Status: DC | PRN

## 2022-06-29 MED ORDER — ONDANSETRON HCL 4 MG/2ML IJ SOLN
INTRAMUSCULAR | Status: DC | PRN
  Administered 2022-06-29: 4 mg via INTRAVENOUS

## 2022-06-29 MED ORDER — LIDOCAINE 2% (20 MG/ML) 5 ML SYRINGE
INTRAMUSCULAR | Status: DC | PRN
  Administered 2022-06-29: 80 mg via INTRAVENOUS

## 2022-06-29 MED ORDER — PHENYLEPHRINE 80 MCG/ML (10ML) SYRINGE FOR IV PUSH (FOR BLOOD PRESSURE SUPPORT)
PREFILLED_SYRINGE | INTRAVENOUS | Status: DC | PRN
  Administered 2022-06-29 (×2): 80 ug via INTRAVENOUS

## 2022-06-29 MED ORDER — LIDOCAINE HCL (PF) 1 % IJ SOLN
INTRAMUSCULAR | Status: DC | PRN
  Administered 2022-06-29: 7 mg

## 2022-06-29 MED ORDER — OXYCODONE HCL 5 MG/5ML PO SOLN: 5.0000 mg | Freq: Once | ORAL | Status: DC | PRN

## 2022-06-29 MED ORDER — CHLORHEXIDINE GLUCONATE 0.12 % MT SOLN
15.0000 mL | Freq: Once | OROMUCOSAL | Status: AC
  Administered 2022-06-29: 15 mL via OROMUCOSAL
  Filled 2022-06-29: qty 15

## 2022-06-29 MED ORDER — LACTATED RINGERS IV SOLN: INTRAVENOUS | Status: DC

## 2022-06-29 MED ORDER — PROMETHAZINE HCL 25 MG/ML IJ SOLN: 6.2500 mg | INTRAMUSCULAR | Status: DC | PRN

## 2022-06-29 MED ORDER — MIDAZOLAM HCL 2 MG/2ML IJ SOLN: 0.5000 mg | Freq: Once | INTRAMUSCULAR | Status: DC | PRN

## 2022-06-29 SURGICAL SUPPLY — 34 items
BLADE CLIPPER SURG (BLADE) ×2 IMPLANT
BNDG ELASTIC 4X5.8 VLCR STR LF (GAUZE/BANDAGES/DRESSINGS) IMPLANT
BNDG ELASTIC 6X5.8 VLCR STR LF (GAUZE/BANDAGES/DRESSINGS) IMPLANT
BNDG GAUZE ELAST 4 BULKY (GAUZE/BANDAGES/DRESSINGS) IMPLANT
CANISTER SUCT 3000ML PPV (MISCELLANEOUS) ×2 IMPLANT
CLIP TI WIDE RED SMALL 6 (CLIP) ×2 IMPLANT
COVER SURGICAL LIGHT HANDLE (MISCELLANEOUS) ×4 IMPLANT
DERMABOND ADVANCED (GAUZE/BANDAGES/DRESSINGS) ×1
DERMABOND ADVANCED .7 DNX12 (GAUZE/BANDAGES/DRESSINGS) IMPLANT
ELECT REM PT RETURN 9FT ADLT (ELECTROSURGICAL) ×2
ELECTRODE REM PT RTRN 9FT ADLT (ELECTROSURGICAL) ×1 IMPLANT
GAUZE 4X4 16PLY ~~LOC~~+RFID DBL (SPONGE) ×2 IMPLANT
GAUZE SPONGE 4X4 12PLY STRL (GAUZE/BANDAGES/DRESSINGS) ×2 IMPLANT
GLOVE SURG SIGNA 7.5 PF LTX (GLOVE) ×2 IMPLANT
GOWN STRL REUS W/ TWL LRG LVL3 (GOWN DISPOSABLE) ×2 IMPLANT
GOWN STRL REUS W/ TWL XL LVL3 (GOWN DISPOSABLE) ×1 IMPLANT
GOWN STRL REUS W/TWL LRG LVL3 (GOWN DISPOSABLE) ×2
GOWN STRL REUS W/TWL XL LVL3 (GOWN DISPOSABLE) ×1
KIT BASIN OR (CUSTOM PROCEDURE TRAY) ×2 IMPLANT
KIT TURNOVER KIT B (KITS) ×2 IMPLANT
NS IRRIG 1000ML POUR BTL (IV SOLUTION) ×2 IMPLANT
PACK GENERAL/GYN (CUSTOM PROCEDURE TRAY) ×2 IMPLANT
PACK UNIVERSAL I (CUSTOM PROCEDURE TRAY) ×2 IMPLANT
PAD ARMBOARD 7.5X6 YLW CONV (MISCELLANEOUS) ×4 IMPLANT
SPONGE T-LAP 18X18 ~~LOC~~+RFID (SPONGE) ×8 IMPLANT
SPONGE T-LAP 4X18 ~~LOC~~+RFID (SPONGE) ×2 IMPLANT
STAPLER VISISTAT 35W (STAPLE) IMPLANT
SUT VIC AB 2-0 CTX 36 (SUTURE) IMPLANT
SUT VIC AB 3-0 SH 27 (SUTURE) ×1
SUT VIC AB 3-0 SH 27X BRD (SUTURE) IMPLANT
SUT VIC AB 3-0 X1 27 (SUTURE) ×2 IMPLANT
TOWEL GREEN STERILE (TOWEL DISPOSABLE) ×2 IMPLANT
TOWEL GREEN STERILE FF (TOWEL DISPOSABLE) ×2 IMPLANT
WATER STERILE IRR 1000ML POUR (IV SOLUTION) ×2 IMPLANT

## 2022-06-29 NOTE — Anesthesia Preprocedure Evaluation (Addendum)
Anesthesia Evaluation  Patient identified by MRN, date of birth, ID band Patient awake    Reviewed: Allergy & Precautions, NPO status , Patient's Chart, lab work & pertinent test results  History of Anesthesia Complications Negative for: history of anesthetic complications  Airway Mallampati: III  TM Distance: >3 FB Neck ROM: Full    Dental  (+) Dental Advisory Given   Pulmonary neg pulmonary ROS,    breath sounds clear to auscultation       Cardiovascular hypertension, Pt. on medications (-) angina Rhythm:Regular Rate:Normal  '16 ECHO: Abnormal septal motion Hard to judge EF due to frequent ventricular ectopy and poor image quality. The cavity size was mildly dilated. Wall thickness was normal. EF 40%, no significant valvular abnormalities    Neuro/Psych Anxiety Parkinson's H/o Bell's palsy TIA   GI/Hepatic negative GI ROS, Neg liver ROS,   Endo/Other  negative endocrine ROS  Renal/GU negative Renal ROS     Musculoskeletal   Abdominal   Peds  Hematology H/o Hodgkin's: s/p chemo and stem cell transplant, XRT   Anesthesia Other Findings   Reproductive/Obstetrics                            Anesthesia Physical Anesthesia Plan  ASA: 3  Anesthesia Plan: MAC   Post-op Pain Management: Tylenol PO (pre-op)*   Induction: Intravenous  PONV Risk Score and Plan: 2 and Ondansetron and Treatment may vary due to age or medical condition  Airway Management Planned: Simple Face Mask  Additional Equipment: None  Intra-op Plan:   Post-operative Plan:   Informed Consent: I have reviewed the patients History and Physical, chart, labs and discussed the procedure including the risks, benefits and alternatives for the proposed anesthesia with the patient or authorized representative who has indicated his/her understanding and acceptance.     Dental advisory given  Plan Discussed with: CRNA and  Surgeon  Anesthesia Plan Comments:        Anesthesia Quick Evaluation

## 2022-06-29 NOTE — Op Note (Signed)
NAME: ROBERTS, BON MEDICAL RECORD NO: [id number] ACCOUNT NO: [id number] DATE OF BIRTH: 11-18-1961 FACILITY: [facility] LOCATION: [facility] PHYSICIAN: Revonda Standard. Roxan Hockey, MD  Operative Report   DATE OF PROCEDURE: 06/29/2022  PREOPERATIVE DIAGNOSES:  Right axillary adenopathy, history of Hodgkin's lymphoma.  POSTOPERATIVE DIAGNOSES:  Right axillary adenopathy, history of Hodgkin's lymphoma.  PROCEDURE:  Right axillary lymph node biopsy.  SURGEON:  Revonda Standard. Roxan Hockey, MD  ASSISTANT:  None.  ANESTHESIA:  Local with intravenous sedation.  BLOOD LOSS:  Minimal.  FINDINGS:  Approximately 8 mm firm lymph node sent for permanent pathology.  CLINICAL NOTE:  The patient is a 61 year old gentleman with a history of Hodgkin's lymphoma.  He recently had a followup appointment including a PET scan, which showed hypermetabolic activity in a small right axillary lymph node as well as an area in  the buttocks region.  He was referred for biopsy of the right axillary node to rule out recurrent lymphoma.  The indications, risks, benefits, and alternatives were discussed in detail with the patient.  He understood and accepted the risks and agreed to  proceed.  DESCRIPTION OF PROCEDURE:  The patient was brought to the operating room on 06/29/2022.  He was given intravenous sedation and monitored by the anesthesia service.  Sequential compression devices placed on the calves for DVT prophylaxis.  Intravenous  antibiotics were administered.  The right axilla and chest were prepped and draped in the usual sterile fashion.  A timeout was performed.  The node was palpable in the inferior aspect of the right axilla.  The operative site was anesthetized with 10 mL of 1% lidocaine.  After ensuring adequate local anesthetic effect, an incision was made.  The node was dissected  out. Feeding vessels were clipped and divided.  The node was removed intact and sent for permanent pathology.  The wound was irrigated.   There was good hemostasis.  The subcutaneous tissue was closed with a running 2-0 Vicryl subcutaneous suture and then  the skin was closed with a 3-0 Vicryl subcuticular suture.  Dermabond was applied.  The patient then was taken from the operating room to the postanesthetic care unit in good condition.  All sponge, needle and instrument counts were correct.   NIK D: 06/29/2022 5:12:13 pm T: 06/29/2022 9:59:00 pm  JOB: [id number]/ [id number]

## 2022-06-29 NOTE — Transfer of Care (Signed)
Immediate Anesthesia Transfer of Care Note  Patient: FRANS VALENTE  Procedure(s) Performed: RIGHT AXILLARY NODE BIOPSY (Right: Axilla)  Patient Location: PACU  Anesthesia Type:MAC  Level of Consciousness: awake and alert   Airway & Oxygen Therapy: Patient Spontanous Breathing and Patient connected to face mask oxygen  Post-op Assessment: Report given to RN and Post -op Vital signs reviewed and stable  Post vital signs: Reviewed and stable  Last Vitals:  Vitals Value Taken Time  BP 106/60 06/29/22 1332  Temp 36.4 C 06/29/22 1332  Pulse 76 06/29/22 1337  Resp 12 06/29/22 1337  SpO2 93 % 06/29/22 1337  Vitals shown include unvalidated device data.  Last Pain:  Vitals:   06/29/22 1332  TempSrc:   PainSc: Asleep         Complications: No notable events documented.

## 2022-06-29 NOTE — Anesthesia Postprocedure Evaluation (Signed)
Anesthesia Post Note  Patient: Brian [name]  Procedure(s) Performed: RIGHT AXILLARY NODE BIOPSY (Right: Axilla)     Patient location during evaluation: PACU Anesthesia Type: MAC Level of consciousness: awake and alert, patient cooperative and oriented Pain management: pain level controlled Vital Signs Assessment: post-procedure vital signs reviewed and stable Respiratory status: spontaneous breathing, nonlabored ventilation and respiratory function stable Cardiovascular status: blood pressure returned to baseline and stable Postop Assessment: no apparent nausea or vomiting and able to ambulate Anesthetic complications: no   No notable events documented.  Last Vitals:  Vitals:   06/29/22 1332 06/29/22 1347  BP: 106/60 119/82  Pulse: 75 80  Resp: 15 19  Temp: 36.4 C   SpO2: 94% 95%    Last Pain:  Vitals:   06/29/22 1332  TempSrc:   PainSc: Asleep                 [name],E. Isom Kochan

## 2022-06-29 NOTE — Interval H&P Note (Signed)
History and Physical Interval Note:  06/29/2022 12:14 PM  Brian [name]  has presented today for surgery, with the diagnosis of Right axillary lymph node adenopathy.  The various methods of treatment have been discussed with the patient and family. After consideration of risks, benefits and other options for treatment, the patient has consented to  Procedure(s): RIGHT AXILLARY NODE BIOPSY (Right) as a surgical intervention.  The patient's history has been reviewed, patient examined, no change in status, stable for surgery.  I have reviewed the patient's chart and labs.  Questions were answered to the patient's satisfaction.     Melrose Nakayama

## 2022-06-29 NOTE — Discharge Instructions (Addendum)
Do not drive or engage in heavy physical activity for 24 hours  You may shower tomorrow.  There is a medical adhesive over the incision, it will begin to peel off in about 2 weeks.  You have a prescription for tramadol, a mild narcotic pain reliever.  You may use as directed.  You may use acetaminophen (Tylenol) or ibuprofen (Advil, Motrin) in addition to, or instead of the tramadol  Call [phone] if you develop excessive pain, swelling or redness or have drainage from the incision  My office will contact you with follow up information

## 2022-06-29 NOTE — Brief Op Note (Signed)
06/29/2022  2:09 PM  PATIENT:  Brian [name]  61 y.o. male  PRE-OPERATIVE DIAGNOSIS:  Right axillary lymph node adenopathy  POST-OPERATIVE DIAGNOSIS:  Right axillary lymph node adenopathy  PROCEDURE:  Procedure(s): RIGHT AXILLARY NODE BIOPSY (Right)  SURGEON:  Surgeon(s) and Role:    Melrose Nakayama, MD - Primary  PHYSICIAN ASSISTANT:   ASSISTANTS: none   ANESTHESIA:   local and IV sedation  EBL:  minimal   BLOOD ADMINISTERED:none  DRAINS: none   LOCAL MEDICATIONS USED:  LIDOCAINE  and Amount: 10 ml  SPECIMEN:  Source of Specimen:  right axillary node  DISPOSITION OF SPECIMEN:  PATHOLOGY  COUNTS:  YES  TOURNIQUET:  * No tourniquets in log *  DICTATION: .Other Dictation: Dictation Number -  PLAN OF CARE: Discharge to home after PACU  PATIENT DISPOSITION:  PACU - hemodynamically stable.   Delay start of Pharmacological VTE agent (>24hrs) due to surgical blood loss or risk of bleeding: not applicable

## 2022-06-30 ENCOUNTER — Encounter (HOSPITAL_COMMUNITY): Payer: Self-pay | Admitting: Thoracic Surgery (Cardiothoracic Vascular Surgery)

## 2022-07-04 LAB — SURGICAL PATHOLOGY

## 2022-07-05 ENCOUNTER — Ambulatory Visit (INDEPENDENT_AMBULATORY_CARE_PROVIDER_SITE_OTHER): Payer: Commercial Managed Care - PPO | Admitting: Thoracic Surgery (Cardiothoracic Vascular Surgery)

## 2022-07-05 VITALS — BP 134/80 | HR 93 | Resp 20 | Ht 70.0 in | Wt 182.0 lb

## 2022-07-05 DIAGNOSIS — R590 Localized enlarged lymph nodes: Secondary | ICD-10-CM

## 2022-07-05 DIAGNOSIS — Z9889 Other specified postprocedural states: Secondary | ICD-10-CM | POA: Diagnosis not present

## 2022-07-05 NOTE — Progress Notes (Signed)
[address]       [place] [address]             [phone]     HPI: Brian [name] returns for a follow-up after lymph node biopsy.  Brian [name] is a 61 year old man with a history of Hodgkin's disease.  He was first diagnosed about 10 years ago.  He was treated with chemo and radiation.  He had salvage chemotherapy followed by autologous stem cell transplant.  He also had CAR-T therapy and most recently was on Opdivo.  He had not been on any therapy recently and had a follow-up PET/CT which showed hypermetabolic nodules in the right axilla and right gluteal region.  I did a right axillary lymph node biopsy on 06/29/2022.  He tolerated that well the same day.  He has not had any issues since surgery.  Past Medical History:  Diagnosis Date   Anxiety    Dysrhythmia    past hx pvc   Goals of care, counseling/discussion 09/25/2018   H/O autologous stem cell transplant ([place])    may 2014  at [place]   History of Bell's palsy    2009  RIGHT SIDE--  HAS 80% FUNCTION / PT STATES A LITTLE ASYMETRICAL AND EFFECTS MOUTH   History of radiation therapy 10/18/17-11/05/17   sprine T1 26 Gy in 13 fractions, spine boost 10 Gy in 5 fractions   Hodgkin's disease, nodular sclerosis, of inguinal region/lower limb ([place]) ONOLOGIST--  DR [facility]NE[facility] AND [facility]     SALVAGE CHEMO 2013/  AUTOLOGUS STEM CELL TRANSPLANT MAY 2014 AT [facility]   Hypertension    Mass of right chest wall 09/18/2018   Mass of right inguinal region    Neuromuscular disorder ([place])    bilateral feet neuropathy   PVC (premature ventricular contraction)    "benign"   TIA (transient ischemic attack) 02/2015   "probable TIA"   Wears contact lenses     Current Outpatient Medications  Medication Sig Dispense Refill   acetaminophen (TYLENOL) 500 MG tablet Take 1,000 mg by mouth every 8 (eight) hours as needed for moderate pain.     carbidopa-levodopa (SINEMET CR) 50-200 MG tablet Take 1 tablet by mouth at bedtime. 90 tablet 0    carbidopa-levodopa (SINEMET IR) 25-100 MG tablet 2 tablets at 7 AM, 2 tablets at 11 AM, 2 tablet at 3 PM, 2 tablet at 7 PM 720 tablet 1   cholecalciferol (VITAMIN D3) 25 MCG (1000 UNIT) tablet Take 1,000 Units by mouth daily.     ibuprofen (ADVIL) 200 MG tablet Take 200 mg by mouth every 6 (six) hours as needed for moderate pain.     olmesartan (BENICAR) 20 MG tablet TAKE 1 TABLET(20 MG) BY MOUTH DAILY 30 tablet 3   pramipexole (MIRAPEX) 0.5 MG tablet TAKE 1 TABLET(0.5 MG) BY MOUTH THREE TIMES DAILY 270 tablet 1   traMADol (ULTRAM) 50 MG tablet Take 1-2 tablets (50-100 mg total) by mouth every 6 (six) hours as needed for moderate pain. 25 tablet 0   No current facility-administered medications for this visit.    Physical Exam BP 134/80   Pulse 93   Resp 20   Ht '5\' 10"'$  (1.778 m)   Wt 182 lb (82.6 kg)   SpO2 98% Comment: RA  BMI 26.60 kg/m  61 year old man in no acute distress Incision clean dry and intact, no erythema or induration  Diagnostic Tests: Pathology showed Hodgkin's disease.  Impression: Brian [name] is  a 61 year old man with a history of Hodgkin's disease with multiple recurrences.  He now has recurrent disease again.  He has appointments with Dr. Marin Olp and Dr. Sondra Come in the next few days to discuss treatment options.  The standpoint of his biopsy he has done well.  There are no restrictions on his activities.  Plan: Follow-up with Dr. Marin Olp and Dr. Sondra Come I will be happy to see Brian [name] back anytime if I can be of any assistance with his care  Melrose Nakayama, MD [facility] [phone]

## 2022-07-06 NOTE — Progress Notes (Signed)
Histology and Location of Primary Cancer: Recurrent Hodgkin's Disease   Location(s) of Symptomatic tumor(s): 7 mm lymph node in the right inferior axillary area and a 7 mm nodule in the subcutaneous tissue in his right gluteal area.  Biopsy done 06/29/2022 by Dr. Roxan Hockey showed:  A. LYMPH NODE, RIGHT AXILLARY, EXCISION:  -Classical Hodgkin lymphoma  Past/Anticipated chemotherapy by medical oncology, if any: S/p CAR-T therapy    Patient's main complaints related to symptomatic tumor(s) are: ***  Pain on a scale of 0-10 is: ***   SAFETY ISSUES: Prior radiation? *** Pacemaker/ICD? *** Possible current pregnancy? *** Is the patient on methotrexate? ***  Additional Complaints / other details:  ***

## 2022-07-09 NOTE — Progress Notes (Signed)
Radiation Oncology         [phone] ________________________________  Outpatient Re-Consultation  Name: Brian [name] MRN: [id number]  Date: 07/10/2022  DOB: [birth year]  NW:[id number], [name], MD  [name], MD   REFERRING PHYSICIAN: [name], MD  DIAGNOSIS: There were no encounter diagnoses.  Recurrent Hodgkin's Disease -  S/p salvage chemotherapy, autologous stem cell transplant in May of 2014, CAR-T therapy at [facility] in 2017, and radiation - recent recurrence PET scan 05/07/22  Parkinson's disease - followed by [facility]   Interval Since Last Radiation:  4 years, 8 months, and 4 days  2) 10/18/17 - 11/05/17; 26 Gy in 13 fractions directed to the Spine T1. Followed by left transverse process boost, 10 Gy in 5 fractions.   1) Total lymphoid radiation therapy as part of his autologous transplant 18 Gy in 10 fractions twice daily for 5 days 1.8 Gy per fraction. 04/13/2013-04/17/2013 for stage III refractory Hodgkin lymphoma. [place] ILLNESS 07/10/22::Brian [name] is a 61 y.o. male who is accompanied by ***. he is seen as a courtesy of Dr. Marin Olp for an opinion concerning radiation therapy as part of management for his recent recurrence of hodgkin's lymphoma. I last met with the patient for a 1 month post RT follow up in January 2019 for his recurrent disease. Since then, the patient completed 18 cycles of opdivo (Nivolumab) under the care of Dr. Marin Olp. He completed this in 2022 and has maintained follow-up visits with Dr. Marin Olp since. (Patient has been without evidence of disease recurrence since his recent recurrent detailed below).   Recently, the patient presented for a restaging PET scan on 05/07/22 which unfortunately demonstrated an isolated inferior right axillary hypermetabolic enlarging node measuring 7 mm (S.U.V. max of 5.5), suspicious for recurrent disease. PET also showed an enlarging right pelvic subcutaneous hypermetabolic  nodule; indeterminate for subcutaneous lymphoma versus an infectious/inflammatory etiology, measuring 7 mm, previously 5 mm on 11/03/21 PET scan.     The patient accordingly followed up with Dr. Marin Olp on 05/09/22. Given his history of recurrences, Dr. Marin Olp recommended lymph node removal.   Subsequently, the patient was referred to Dr. Roxan Hockey and proceeded with right axillary lymph node biopsy on 06/29/22. Final pathology revealed findings consistent with classic Hodgkin's lymphoma.     HPI Initial Consultation 10/02/22:   Brian [name] is a 61 y.o. male who is here for evaluation of recurrent Hodgkin's Disease.   Of note, his most recent PET scan was on 08/22/2017 and revealed increased uptake to the region of the left T1 transverse process. He then had an MRI on 09/06/2017 showing a focal metastatic lesion of the left transverse process at T1, extending into the articular pillar. A bone biopsy was then completed on 09/19/2017 with results revealing bony tissue with limited atypical lymphoid infiltrate. Findings consistent with  Hodgkin's disease   Pt presents to the office today accompanied by his ex-wife. He reports that he is doing well overall. 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    On review of systems, he reports mild swelling and mild soreness around the biopsy site in his back that has mostly resolved. He denies any other pain, SOB. He reports being diagnosed with Parkinson's disease approximately one year ago. Parkinson's disease is been controlled well with medication.He denies smoking, smokeless tobacco or drug use. He reports about 8 ounces of ETOH use weekly.    PAST MEDICAL HISTORY:  Past Medical History:  Diagnosis Date   Anxiety    Dysrhythmia    past hx pvc   Goals of care, counseling/discussion 09/25/2018   H/O autologous stem cell transplant ([place])    may 2014  at [place]   History of Bell's palsy    2009  RIGHT SIDE--  HAS 80% FUNCTION / PT  STATES A LITTLE ASYMETRICAL AND EFFECTS MOUTH   History of radiation therapy 10/18/17-11/05/17   sprine T1 26 Gy in 13 fractions, spine boost 10 Gy in 5 fractions   Hodgkin's disease, nodular sclerosis, of inguinal region/lower limb ([facility]) ONOLOGIST--  DR [facility] AND [facility]     SALVAGE CHEMO 2013/  AUTOLOGUS STEM CELL TRANSPLANT MAY 2014 AT [facility]   Hypertension    Mass of right chest wall 09/18/2018   Mass of right inguinal region    Neuromuscular disorder ([place])    bilateral feet neuropathy   PVC (premature ventricular contraction)    "benign"   TIA (transient ischemic attack) 02/2015   "probable TIA"   Wears contact lenses     PAST SURGICAL HISTORY: Past Surgical History:  Procedure Laterality Date   AXILLARY LYMPH NODE BIOPSY Left 02/02/2013   Procedure: NEEDLE LOCALIZED AXILLARY LYMPH NODE BIOPSY;  Surgeon: Haywood Lasso, MD;  Location: [place];  Service: General;  Laterality: Left;   BACK SURGERY  03/2021   BONE MARROW BIOPSY  08/26/2012   COLONOSCOPY  2021   CYST REMOVAL NECK Left 11/26/1978   LEFT INGUINAL LYMPH NODE BX  09/09/2012   LYMPH NODE BIOPSY N/A 07/28/2015   Procedure: LYMPH NODE BIOPSY;  Surgeon: Melrose Nakayama, MD;  Location: [place];  Service: Thoracic;  Laterality: N/A;   LYMPH NODE BIOPSY Right 06/29/2022   Procedure: RIGHT AXILLARY NODE BIOPSY;  Surgeon: Melrose Nakayama, MD;  Location: [place];  Service: Vascular;  Laterality: Right;   MASS EXCISION Right 09/19/2018   Procedure: EXCISION OF RIGHT CHEST WALL MASS ERAS PATHWAY;  Surgeon: Fanny Skates, MD;  Location: [place];  Service: General;  Laterality: Right;   NODE DISSECTION Right 07/28/2015   Procedure: NODE DISSECTION;  Surgeon: Melrose Nakayama, MD;  Location: [place];  Service: Thoracic;  Laterality: Right;   PLEURA BIOPSY Left 05/31/2014   REMOVAL RIGHT INGUINAL LYMPH NODES  08/16/2011   SCROTAL EXPLORATION Right 01/04/2014   Procedure: SCROTUM  EXPLORATION   INGUINAL , EXCISION OF CYSTIC MASS OF RIGHT SPERMATIC CORD, [place];  Surgeon: Franchot Gallo, MD;  Location[facility];  Service: Urology;  Laterality: Right;   TRANSTHORACIC ECHOCARDIOGRAM  03/18/2013   MILD LVH/  EF 55-60%   VIDEO ASSISTED THORACOSCOPY Left 05/31/2014   Procedure: LEFT VIDEO ASSISTED THORACOSCOPY, PLEURAL BIOPSY;  Surgeon: Melrose Nakayama, MD;  Location: [place];  Service: Thoracic;  Laterality: Left;   VIDEO ASSISTED THORACOSCOPY Right 07/28/2015   Procedure: RIGHT VIDEO ASSISTED THORACOSCOPY;  Surgeon: Melrose Nakayama, MD;  Location: [place];  Service: Thoracic;  Laterality: Right;   VIDEO ASSISTED THORACOSCOPY (VATS)/ LYMPH NODE SAMPLING Right 07/28/2015   VIDEO ASSISTED THORACOSCOPY (VATS)/WEDGE RESECTION  05/31/2014   VIDEO BRONCHOSCOPY WITH ENDOBRONCHIAL ULTRASOUND  07/28/2015   VIDEO BRONCHOSCOPY WITH ENDOBRONCHIAL ULTRASOUND N/A 07/28/2015   Procedure: VIDEO BRONCHOSCOPY WITH ENDOBRONCHIAL ULTRASOUND;  Surgeon: Melrose Nakayama, MD;  Location: [facility];  Service: Thoracic;  Laterality: N/A;    FAMILY HISTORY:  Family History  Problem Relation Age of Onset   Cancer Mother  40       ovarian   Ovarian cancer Mother 58   Heart disease Father    Stroke Father    Cancer Brother        testicular   Hypertension Brother    Healthy Son    Healthy Son    Hypertension Brother    Other Brother        CMT   Hypertension Brother     SOCIAL HISTORY:  Social History   Tobacco Use   Smoking status: Never   Smokeless tobacco: Never  Vaping Use   Vaping Use: Never used  Substance Use Topics   Alcohol use: Yes    Alcohol/week: 14.0 standard drinks of alcohol    Types: 7 Glasses of wine, 7 Cans of beer per week    Comment: one drink per night   Drug use: No    ALLERGIES: No Known Allergies  MEDICATIONS:  Current Outpatient Medications  Medication Sig Dispense Refill   acetaminophen (TYLENOL) 500 MG tablet  Take 1,000 mg by mouth every 8 (eight) hours as needed for moderate pain.     carbidopa-levodopa (SINEMET CR) 50-200 MG tablet Take 1 tablet by mouth at bedtime. 90 tablet 0   carbidopa-levodopa (SINEMET IR) 25-100 MG tablet 2 tablets at 7 AM, 2 tablets at 11 AM, 2 tablet at 3 PM, 2 tablet at 7 PM 720 tablet 1   cholecalciferol (VITAMIN D3) 25 MCG (1000 UNIT) tablet Take 1,000 Units by mouth daily.     ibuprofen (ADVIL) 200 MG tablet Take 200 mg by mouth every 6 (six) hours as needed for moderate pain.     olmesartan (BENICAR) 20 MG tablet TAKE 1 TABLET(20 MG) BY MOUTH DAILY 30 tablet 3   pramipexole (MIRAPEX) 0.5 MG tablet TAKE 1 TABLET(0.5 MG) BY MOUTH THREE TIMES DAILY 270 tablet 1   traMADol (ULTRAM) 50 MG tablet Take 1-2 tablets (50-100 mg total) by mouth every 6 (six) hours as needed for moderate pain. 25 tablet 0   No current facility-administered medications for this encounter.    REVIEW OF SYSTEMS:  A 10+ POINT REVIEW OF SYSTEMS WAS OBTAINED including neurology, dermatology, psychiatry, cardiac, respiratory, lymph, extremities, GI, GU, musculoskeletal, constitutional, reproductive, HEENT. ***   PHYSICAL EXAM:  vitals were not taken for this visit.   General: Alert and oriented, in no acute distress HEENT: Head is normocephalic. Extraocular movements are intact. Oropharynx is clear. Neck: Neck is supple, no palpable cervical or supraclavicular lymphadenopathy. Heart: Regular in rate and rhythm with no murmurs, rubs, or gallops. Chest: Clear to auscultation bilaterally, with no rhonchi, wheezes, or rales. Abdomen: Soft, nontender, nondistended, with no rigidity or guarding. Extremities: No cyanosis or edema. Lymphatics: see Neck Exam Skin: No concerning lesions. Musculoskeletal: symmetric strength and muscle tone throughout. Neurologic: Cranial nerves II through XII are grossly intact. No obvious focalities. Speech is fluent. Coordination is intact. Psychiatric: Judgment and insight  are intact. Affect is appropriate. ***  ECOG = ***  0 - Asymptomatic (Fully active, able to carry on all predisease activities without restriction)  1 - Symptomatic but completely ambulatory (Restricted in physically strenuous activity but ambulatory and able to carry out work of a light or sedentary nature. For example, light housework, office work)  2 - Symptomatic, <50% in bed during the day (Ambulatory and capable of all self care but unable to carry out any work activities. Up and about more than 50% of waking hours)  3 - Symptomatic, >50% in bed,  but not bedbound (Capable of only limited self-care, confined to bed or chair 50% or more of waking hours)  4 - Bedbound (Completely disabled. Cannot carry on any self-care. Totally confined to bed or chair)  5 - Death   Eustace Pen MM, Creech RH, Tormey DC, et al. [phone]). "Toxicity and response criteria of the [facility]". [place] Oncol. 5 (6): 649-55  LABORATORY DATA:  Lab Results  Component Value Date   WBC 6.0 06/27/2022   HGB 13.1 06/27/2022   HCT 40.4 06/27/2022   MCV 98.1 06/27/2022   PLT 222 06/27/2022   NEUTROABS 4.4 05/09/2022   Lab Results  Component Value Date   NA 141 06/27/2022   K 4.5 06/27/2022   CL 107 06/27/2022   CO2 28 06/27/2022   GLUCOSE 100 (H) 06/27/2022   BUN 29 (H) 06/27/2022   CREATININE 1.16 06/27/2022   CALCIUM 9.6 06/27/2022      RADIOGRAPHY: DG Chest 2 View  Result Date: 06/27/2022 CLINICAL DATA:  Preoperative evaluation. EXAM: CHEST - 2 VIEW COMPARISON:  Chest radiograph 08/30/2015 FINDINGS: The heart size and mediastinal contours are within normal limits. Both lungs are clear. The visualized skeletal structures are unremarkable. IMPRESSION: No active cardiopulmonary disease. Electronically Signed   By: Lovey Newcomer M.D.   On: 06/27/2022 11:23      IMPRESSION: Recurrent Hodgkin's Disease -  S/p salvage chemotherapy, autologous stem cell transplant in May of 2014, CAR-T  therapy at [facility] in 2017, and radiation - recent recurrence PET scan 05/07/22  ***  Today, I talked to the patient and family about the findings and work-up thus far.  We discussed the natural history of *** and general treatment, highlighting the role of radiotherapy in the management.  We discussed the available radiation techniques, and focused on the details of logistics and delivery.  We reviewed the anticipated acute and late sequelae associated with radiation in this setting.  The patient was encouraged to ask questions that I answered to the best of my ability. *** A patient consent form was discussed and signed.  We retained a copy for our records.  The patient would like to proceed with radiation and will be scheduled for CT simulation.  PLAN: ***    *** minutes of total time was spent for this patient encounter, including preparation, face-to-face counseling with the patient and coordination of care, physical exam, and documentation of the encounter.   ------------------------------------------------  Blair Promise, PhD, MD  This document serves as a record of services personally performed by Gery Pray, MD. It was created on his behalf by Roney Mans, a trained medical scribe. The creation of this record is based on the scribe's personal observations and the provider's statements to them. This document has been checked and approved by the attending provider.

## 2022-07-10 ENCOUNTER — Inpatient Hospital Stay: Payer: Commercial Managed Care - PPO | Attending: Hematology & Oncology

## 2022-07-10 ENCOUNTER — Other Ambulatory Visit: Payer: Self-pay

## 2022-07-10 ENCOUNTER — Ambulatory Visit
Admission: RE | Admit: 2022-07-10 | Discharge: 2022-07-10 | Disposition: A | Discharge status: Home / Self Care | Payer: Commercial Managed Care - PPO | Source: Ambulatory Visit | Attending: Radiation Oncology | Admitting: Radiation Oncology

## 2022-07-10 ENCOUNTER — Encounter: Payer: Self-pay | Admitting: Hematology & Oncology

## 2022-07-10 ENCOUNTER — Inpatient Hospital Stay (HOSPITAL_BASED_OUTPATIENT_CLINIC_OR_DEPARTMENT_OTHER): Payer: Commercial Managed Care - PPO | Admitting: Hematology & Oncology

## 2022-07-10 ENCOUNTER — Encounter: Payer: Self-pay | Admitting: Radiation Oncology

## 2022-07-10 VITALS — BP 153/98 | HR 80 | Temp 97.6°F | Wt 179.2 lb

## 2022-07-10 VITALS — BP 114/74 | HR 77 | Temp 97.5°F | Resp 18 | Ht 70.0 in | Wt 180.5 lb

## 2022-07-10 DIAGNOSIS — C8199 Hodgkin lymphoma, unspecified, extranodal and solid organ sites: Secondary | ICD-10-CM | POA: Insufficient documentation

## 2022-07-10 DIAGNOSIS — Z8673 Personal history of transient ischemic attack (TIA), and cerebral infarction without residual deficits: Secondary | ICD-10-CM | POA: Insufficient documentation

## 2022-07-10 DIAGNOSIS — Z8041 Family history of malignant neoplasm of ovary: Secondary | ICD-10-CM | POA: Insufficient documentation

## 2022-07-10 DIAGNOSIS — C8195 Hodgkin lymphoma, unspecified, lymph nodes of inguinal region and lower limb: Secondary | ICD-10-CM | POA: Insufficient documentation

## 2022-07-10 DIAGNOSIS — Z923 Personal history of irradiation: Secondary | ICD-10-CM | POA: Insufficient documentation

## 2022-07-10 DIAGNOSIS — I10 Essential (primary) hypertension: Secondary | ICD-10-CM | POA: Insufficient documentation

## 2022-07-10 DIAGNOSIS — Z9221 Personal history of antineoplastic chemotherapy: Secondary | ICD-10-CM | POA: Insufficient documentation

## 2022-07-10 DIAGNOSIS — Z79899 Other long term (current) drug therapy: Secondary | ICD-10-CM | POA: Insufficient documentation

## 2022-07-10 DIAGNOSIS — G20 Parkinson's disease: Secondary | ICD-10-CM | POA: Insufficient documentation

## 2022-07-10 DIAGNOSIS — G629 Polyneuropathy, unspecified: Secondary | ICD-10-CM | POA: Insufficient documentation

## 2022-07-10 LAB — CBC WITH DIFFERENTIAL (CANCER CENTER ONLY)
Abs Immature Granulocytes: 0.04 10*3/uL (ref 0.00–0.07)
Basophils Absolute: 0.1 10*3/uL (ref 0.0–0.1)
Basophils Relative: 1 %
Eosinophils Absolute: 0.3 10*3/uL (ref 0.0–0.5)
Eosinophils Relative: 5 %
HCT: 38.7 % — ABNORMAL LOW (ref 39.0–52.0)
Hemoglobin: 12.8 g/dL — ABNORMAL LOW (ref 13.0–17.0)
Immature Granulocytes: 1 %
Lymphocytes Relative: 19 %
Lymphs Abs: 1.1 10*3/uL (ref 0.7–4.0)
MCH: 32.1 pg (ref 26.0–34.0)
MCHC: 33.1 g/dL (ref 30.0–36.0)
MCV: 97 fL (ref 80.0–100.0)
Monocytes Absolute: 0.7 10*3/uL (ref 0.1–1.0)
Monocytes Relative: 12 %
Neutro Abs: 3.4 10*3/uL (ref 1.7–7.7)
Neutrophils Relative %: 62 %
Platelet Count: 252 10*3/uL (ref 150–400)
RBC: 3.99 MIL/uL — ABNORMAL LOW (ref 4.22–5.81)
RDW: 13.1 % (ref 11.5–15.5)
WBC Count: 5.5 10*3/uL (ref 4.0–10.5)
nRBC: 0 % (ref 0.0–0.2)

## 2022-07-10 LAB — CMP (CANCER CENTER ONLY)
ALT: <5 U/L (ref 0–44)
AST: 15 U/L (ref 15–41)
Albumin: 4.5 g/dL (ref 3.5–5.0)
Alkaline Phosphatase: 77 U/L (ref 38–126)
Anion gap: 7 (ref 5–15)
BUN: 35 mg/dL — ABNORMAL HIGH (ref 8–23)
CO2: 27 mmol/L (ref 22–32)
Calcium: 9.6 mg/dL (ref 8.9–10.3)
Chloride: 105 mmol/L (ref 98–111)
Creatinine: 1.22 mg/dL (ref 0.61–1.24)
GFR, Estimated: >60 mL/min (ref >60)
Glucose, Bld: 99 mg/dL (ref 70–99)
Potassium: 4.4 mmol/L (ref 3.5–5.1)
Sodium: 139 mmol/L (ref 135–145)
Total Bilirubin: 0.7 mg/dL (ref 0.3–1.2)
Total Protein: 7 g/dL (ref 6.5–8.1)

## 2022-07-10 LAB — LACTATE DEHYDROGENASE: LDH: 185 U/L (ref 98–192)

## 2022-07-10 NOTE — Progress Notes (Signed)
Hematology and Oncology Follow Up Visit  Brian [name] [id number] [birth year] 61 y.o. 07/10/2022   Principle Diagnosis:  Recurrent Hodgkin's Disease -  S/p CAR-T therapy -- relapsed TIA-resolved  Current Therapy:  Nivolumab q 3 month -- s/p cycle #18 -- on hold XRT -- Involved field -- start on 07/16/2022       Interim History:  Mr.  Denherder is in for follow-up.  Unfortunately, we did document recurrent disease.  He had a lymph node resected in the right axilla by Dr. Roxan Hockey.  The pathology report ([id number]) showed classical Hodgkin's disease.  He will see Dr. Sondra Come of radiation oncology today to see about involved field radiation.  Otherwise, he is about the same.  He does have Parkinson's which is a real problem.  I wanted to try to avoid immunotherapy as there is some concern that the immunotherapy might be worsening the Hodgkin's.  He has had no problems with cough or shortness of breath.  He has had no nausea or vomiting.  Has been no change in bowel or bladder habits.  He has had no obvious swollen lymph nodes.  There is been no leg swelling.  Overall, his performance status is ECOG 1.    Medications:  Current Outpatient Medications:    carbidopa-levodopa (SINEMET CR) 50-200 MG tablet, Take 1 tablet by mouth at bedtime., Disp: 90 tablet, Rfl: 0   carbidopa-levodopa (SINEMET IR) 25-100 MG tablet, 2 tablets at 7 AM, 2 tablets at 11 AM, 2 tablet at 3 PM, 2 tablet at 7 PM, Disp: 720 tablet, Rfl: 1   cholecalciferol (VITAMIN D3) 25 MCG (1000 UNIT) tablet, Take 1,000 Units by mouth daily., Disp: , Rfl:    ibuprofen (ADVIL) 200 MG tablet, Take 200 mg by mouth every 6 (six) hours as needed for moderate pain., Disp: , Rfl:    olmesartan (BENICAR) 20 MG tablet, TAKE 1 TABLET(20 MG) BY MOUTH DAILY, Disp: 30 tablet, Rfl: 3   pramipexole (MIRAPEX) 0.5 MG tablet, TAKE 1 TABLET(0.5 MG) BY MOUTH THREE TIMES DAILY, Disp: 270 tablet, Rfl: 1   acetaminophen (TYLENOL) 500 MG tablet, Take  1,000 mg by mouth every 8 (eight) hours as needed for moderate pain. (Patient not taking: Reported on 07/10/2022), Disp: , Rfl:    traMADol (ULTRAM) 50 MG tablet, Take 1-2 tablets (50-100 mg total) by mouth every 6 (six) hours as needed for moderate pain., Disp: 25 tablet, Rfl: 0  Allergies: No Known Allergies  Past Medical History, Surgical history, Social history, and Family History were reviewed and updated.  Review of Systems: Review of Systems  Neurological:  Positive for tremors.  All other systems reviewed and are negative.    Physical Exam:  height is '5\' 10"'$  (1.778 m) and weight is 180 lb 8 oz (81.9 kg). His oral temperature is 97.5 F (36.4 C) (abnormal). His blood pressure is 114/74 and his pulse is 77. His respiration is 18 and oxygen saturation is 97%.   Physical Exam Vitals reviewed.  HENT:     Head: Normocephalic and atraumatic.  Eyes:     Pupils: Pupils are equal, round, and reactive to light.  Cardiovascular:     Rate and Rhythm: Normal rate and regular rhythm.     Heart sounds: Normal heart sounds.  Pulmonary:     Effort: Pulmonary effort is normal.     Breath sounds: Normal breath sounds.  Abdominal:     General: Bowel sounds are normal.     Palpations: Abdomen is soft.  Musculoskeletal:        General: No tenderness or deformity. Normal range of motion.     Cervical back: Normal range of motion.  Lymphadenopathy:     Cervical: No cervical adenopathy.  Skin:    General: Skin is warm and dry.     Findings: No erythema or rash.  Neurological:     Mental Status: He is alert and oriented to person, place, and time.  Psychiatric:        Behavior: Behavior normal.        Thought Content: Thought content normal.        Judgment: Judgment normal.     Lab Results  Component Value Date   WBC 5.5 07/10/2022   HGB 12.8 (L) 07/10/2022   HCT 38.7 (L) 07/10/2022   MCV 97.0 07/10/2022   PLT 252 07/10/2022     Chemistry      Component Value Date/Time   NA  139 07/10/2022 0856   NA 142 04/02/2017 0000   NA 140 07/19/2016 1305   K 4.4 07/10/2022 0856   K 4.4 07/19/2016 1305   CL 105 07/10/2022 0856   CL 105 02/08/2016 1117   CL 107 03/25/2013 0828   CO2 27 07/10/2022 0856   CO2 26 07/19/2016 1305   BUN 35 (H) 07/10/2022 0856   BUN 21 04/02/2017 0000   BUN 22.8 07/19/2016 1305   CREATININE 1.22 07/10/2022 0856   CREATININE 1.1 07/19/2016 1305   GLU 105 04/02/2017 0000      Component Value Date/Time   CALCIUM 9.6 07/10/2022 0856   CALCIUM 9.8 07/19/2016 1305   ALKPHOS 77 07/10/2022 0856   ALKPHOS 76 07/19/2016 1305   AST 15 07/10/2022 0856   AST 18 07/19/2016 1305   ALT <5 07/10/2022 0856   ALT 14 07/19/2016 1305   BILITOT 0.7 07/10/2022 0856   BILITOT 0.70 07/19/2016 1305      Impression and Plan: Mr. Roehrig is a 62 year old gentleman.  He has history of recurrent Hodgkin's disease. 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 He then had a recurrence after this.  He did undergo CAR-T therapy at [facility].  I believe he had this in April 2017.  He did well with this.  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  This was proven by biopsy.  He had radiation therapy for this.  He now has another recurrence.  Again this is very localized.  I am sure that there will be obvious systemic recurrence down the road.  I would think that the involved field radiation is probably the way to go with him.  Again he has a Parkinson's which may or may not be related to the immunotherapy.  It clearly is related to the CAR-T therapy that he had back in 2017.  Again I suspect he probably get 3 or 4 weeks of radiation.  I would like to see him back myself in late September.  I probably would do a another PET scan on him in late October.  He is still lucky that his girlfriend is so dedicated.  She really is doing a great job helping to take care of him.  He is motivated as always.     [name],  MD 8/15/20239:54 AM

## 2022-07-16 ENCOUNTER — Other Ambulatory Visit: Payer: Self-pay

## 2022-07-16 ENCOUNTER — Ambulatory Visit
Admission: RE | Admit: 2022-07-16 | Discharge: 2022-07-16 | Disposition: A | Discharge status: Home / Self Care | Payer: Commercial Managed Care - PPO | Source: Ambulatory Visit | Attending: Radiation Oncology | Admitting: Radiation Oncology

## 2022-07-16 DIAGNOSIS — Z510 Encounter for antineoplastic radiation therapy: Secondary | ICD-10-CM | POA: Diagnosis present

## 2022-07-16 DIAGNOSIS — C8195 Hodgkin lymphoma, unspecified, lymph nodes of inguinal region and lower limb: Secondary | ICD-10-CM

## 2022-07-16 DIAGNOSIS — C8194 Hodgkin lymphoma, unspecified, lymph nodes of axilla and upper limb: Secondary | ICD-10-CM | POA: Insufficient documentation

## 2022-07-19 DIAGNOSIS — C8194 Hodgkin lymphoma, unspecified, lymph nodes of axilla and upper limb: Secondary | ICD-10-CM | POA: Diagnosis not present

## 2022-07-23 ENCOUNTER — Ambulatory Visit
Admission: RE | Admit: 2022-07-23 | Discharge: 2022-07-23 | Disposition: A | Discharge status: Home / Self Care | Payer: Commercial Managed Care - PPO | Source: Ambulatory Visit | Attending: Radiation Oncology | Admitting: Radiation Oncology

## 2022-07-23 ENCOUNTER — Other Ambulatory Visit: Payer: Self-pay

## 2022-07-23 DIAGNOSIS — C8195 Hodgkin lymphoma, unspecified, lymph nodes of inguinal region and lower limb: Secondary | ICD-10-CM

## 2022-07-23 DIAGNOSIS — C8194 Hodgkin lymphoma, unspecified, lymph nodes of axilla and upper limb: Secondary | ICD-10-CM | POA: Diagnosis not present

## 2022-07-23 LAB — RAD ONC ARIA SESSION SUMMARY
Course Elapsed Days: 0
Plan Fractions Treated to Date: 1
Plan Fractions Treated to Date: 1
Plan Prescribed Dose Per Fraction: 2.5 Gy
Plan Prescribed Dose Per Fraction: 3 Gy
Plan Total Fractions Prescribed: 10
Plan Total Fractions Prescribed: 10
Plan Total Prescribed Dose: 25 Gy
Plan Total Prescribed Dose: 30 Gy
Reference Point Dosage Given to Date: 2.5 Gy
Reference Point Dosage Given to Date: 3 Gy
Reference Point Session Dosage Given: 2.5 Gy
Reference Point Session Dosage Given: 3 Gy
Session Number: 1

## 2022-07-24 ENCOUNTER — Ambulatory Visit: Payer: Commercial Managed Care - PPO

## 2022-07-24 ENCOUNTER — Ambulatory Visit
Admission: RE | Admit: 2022-07-24 | Discharge: 2022-07-24 | Disposition: A | Discharge status: Home / Self Care | Payer: Commercial Managed Care - PPO | Source: Ambulatory Visit | Attending: Radiation Oncology | Admitting: Radiation Oncology

## 2022-07-24 ENCOUNTER — Other Ambulatory Visit: Payer: Self-pay

## 2022-07-24 DIAGNOSIS — C8194 Hodgkin lymphoma, unspecified, lymph nodes of axilla and upper limb: Secondary | ICD-10-CM | POA: Diagnosis not present

## 2022-07-24 LAB — RAD ONC ARIA SESSION SUMMARY
Course Elapsed Days: 1
Plan Fractions Treated to Date: 2
Plan Fractions Treated to Date: 2
Plan Prescribed Dose Per Fraction: 2.5 Gy
Plan Prescribed Dose Per Fraction: 3 Gy
Plan Total Fractions Prescribed: 10
Plan Total Fractions Prescribed: 10
Plan Total Prescribed Dose: 25 Gy
Plan Total Prescribed Dose: 30 Gy
Reference Point Dosage Given to Date: 5 Gy
Reference Point Dosage Given to Date: 6 Gy
Reference Point Session Dosage Given: 2.5 Gy
Reference Point Session Dosage Given: 3 Gy
Session Number: 2

## 2022-07-25 ENCOUNTER — Other Ambulatory Visit: Payer: Self-pay

## 2022-07-25 ENCOUNTER — Ambulatory Visit
Admission: RE | Admit: 2022-07-25 | Discharge: 2022-07-25 | Disposition: A | Discharge status: Home / Self Care | Payer: Commercial Managed Care - PPO | Source: Ambulatory Visit | Attending: Radiation Oncology | Admitting: Radiation Oncology

## 2022-07-25 DIAGNOSIS — C8194 Hodgkin lymphoma, unspecified, lymph nodes of axilla and upper limb: Secondary | ICD-10-CM | POA: Diagnosis not present

## 2022-07-25 LAB — RAD ONC ARIA SESSION SUMMARY
Course Elapsed Days: 2
Plan Fractions Treated to Date: 3
Plan Fractions Treated to Date: 3
Plan Prescribed Dose Per Fraction: 2.5 Gy
Plan Prescribed Dose Per Fraction: 3 Gy
Plan Total Fractions Prescribed: 10
Plan Total Fractions Prescribed: 10
Plan Total Prescribed Dose: 25 Gy
Plan Total Prescribed Dose: 30 Gy
Reference Point Dosage Given to Date: 7.5 Gy
Reference Point Dosage Given to Date: 9 Gy
Reference Point Session Dosage Given: 2.5 Gy
Reference Point Session Dosage Given: 3 Gy
Session Number: 3

## 2022-07-26 ENCOUNTER — Other Ambulatory Visit: Payer: Self-pay

## 2022-07-26 ENCOUNTER — Ambulatory Visit
Admission: RE | Admit: 2022-07-26 | Discharge: 2022-07-26 | Disposition: A | Discharge status: Home / Self Care | Payer: Commercial Managed Care - PPO | Source: Ambulatory Visit | Attending: Radiation Oncology | Admitting: Radiation Oncology

## 2022-07-26 DIAGNOSIS — C8194 Hodgkin lymphoma, unspecified, lymph nodes of axilla and upper limb: Secondary | ICD-10-CM | POA: Diagnosis not present

## 2022-07-26 LAB — RAD ONC ARIA SESSION SUMMARY
Course Elapsed Days: 3
Plan Fractions Treated to Date: 4
Plan Fractions Treated to Date: 4
Plan Prescribed Dose Per Fraction: 2.5 Gy
Plan Prescribed Dose Per Fraction: 3 Gy
Plan Total Fractions Prescribed: 10
Plan Total Fractions Prescribed: 10
Plan Total Prescribed Dose: 25 Gy
Plan Total Prescribed Dose: 30 Gy
Reference Point Dosage Given to Date: 10 Gy
Reference Point Dosage Given to Date: 12 Gy
Reference Point Session Dosage Given: 2.5 Gy
Reference Point Session Dosage Given: 3 Gy
Session Number: 4

## 2022-07-27 ENCOUNTER — Other Ambulatory Visit: Payer: Self-pay

## 2022-07-27 ENCOUNTER — Ambulatory Visit
Admission: RE | Admit: 2022-07-27 | Discharge: 2022-07-27 | Disposition: A | Discharge status: Home / Self Care | Payer: Commercial Managed Care - PPO | Source: Ambulatory Visit | Attending: Radiation Oncology | Admitting: Radiation Oncology

## 2022-07-27 DIAGNOSIS — C8194 Hodgkin lymphoma, unspecified, lymph nodes of axilla and upper limb: Secondary | ICD-10-CM | POA: Diagnosis present

## 2022-07-27 DIAGNOSIS — Z510 Encounter for antineoplastic radiation therapy: Secondary | ICD-10-CM | POA: Diagnosis present

## 2022-07-27 LAB — RAD ONC ARIA SESSION SUMMARY
Course Elapsed Days: 4
Plan Fractions Treated to Date: 5
Plan Fractions Treated to Date: 5
Plan Prescribed Dose Per Fraction: 2.5 Gy
Plan Prescribed Dose Per Fraction: 3 Gy
Plan Total Fractions Prescribed: 10
Plan Total Fractions Prescribed: 10
Plan Total Prescribed Dose: 25 Gy
Plan Total Prescribed Dose: 30 Gy
Reference Point Dosage Given to Date: 12.5 Gy
Reference Point Dosage Given to Date: 15 Gy
Reference Point Session Dosage Given: 2.5 Gy
Reference Point Session Dosage Given: 3 Gy
Session Number: 5

## 2022-07-31 ENCOUNTER — Other Ambulatory Visit: Payer: Self-pay

## 2022-07-31 ENCOUNTER — Ambulatory Visit
Admission: RE | Admit: 2022-07-31 | Discharge: 2022-07-31 | Disposition: A | Discharge status: Home / Self Care | Payer: Commercial Managed Care - PPO | Source: Ambulatory Visit | Attending: Radiation Oncology | Admitting: Radiation Oncology

## 2022-07-31 DIAGNOSIS — C8194 Hodgkin lymphoma, unspecified, lymph nodes of axilla and upper limb: Secondary | ICD-10-CM | POA: Diagnosis not present

## 2022-07-31 LAB — RAD ONC ARIA SESSION SUMMARY
Course Elapsed Days: 8
Plan Fractions Treated to Date: 6
Plan Fractions Treated to Date: 6
Plan Prescribed Dose Per Fraction: 2.5 Gy
Plan Prescribed Dose Per Fraction: 3 Gy
Plan Total Fractions Prescribed: 10
Plan Total Fractions Prescribed: 10
Plan Total Prescribed Dose: 25 Gy
Plan Total Prescribed Dose: 30 Gy
Reference Point Dosage Given to Date: 15 Gy
Reference Point Dosage Given to Date: 18 Gy
Reference Point Session Dosage Given: 2.5 Gy
Reference Point Session Dosage Given: 3 Gy
Session Number: 6

## 2022-08-01 ENCOUNTER — Other Ambulatory Visit: Payer: Self-pay

## 2022-08-01 ENCOUNTER — Ambulatory Visit
Admission: RE | Admit: 2022-08-01 | Discharge: 2022-08-01 | Disposition: A | Discharge status: Home / Self Care | Payer: Commercial Managed Care - PPO | Source: Ambulatory Visit | Attending: Radiation Oncology | Admitting: Radiation Oncology

## 2022-08-01 DIAGNOSIS — C8194 Hodgkin lymphoma, unspecified, lymph nodes of axilla and upper limb: Secondary | ICD-10-CM | POA: Diagnosis not present

## 2022-08-01 LAB — RAD ONC ARIA SESSION SUMMARY
Course Elapsed Days: 9
Plan Fractions Treated to Date: 7
Plan Fractions Treated to Date: 7
Plan Prescribed Dose Per Fraction: 2.5 Gy
Plan Prescribed Dose Per Fraction: 3 Gy
Plan Total Fractions Prescribed: 10
Plan Total Fractions Prescribed: 10
Plan Total Prescribed Dose: 25 Gy
Plan Total Prescribed Dose: 30 Gy
Reference Point Dosage Given to Date: 17.5 Gy
Reference Point Dosage Given to Date: 21 Gy
Reference Point Session Dosage Given: 2.5 Gy
Reference Point Session Dosage Given: 3 Gy
Session Number: 7

## 2022-08-02 ENCOUNTER — Other Ambulatory Visit: Payer: Self-pay

## 2022-08-02 ENCOUNTER — Ambulatory Visit
Admission: RE | Admit: 2022-08-02 | Discharge: 2022-08-02 | Disposition: A | Discharge status: Home / Self Care | Payer: Commercial Managed Care - PPO | Source: Ambulatory Visit | Attending: Radiation Oncology | Admitting: Radiation Oncology

## 2022-08-02 ENCOUNTER — Telehealth: Payer: Self-pay | Admitting: Neurology

## 2022-08-02 DIAGNOSIS — C8194 Hodgkin lymphoma, unspecified, lymph nodes of axilla and upper limb: Secondary | ICD-10-CM | POA: Diagnosis not present

## 2022-08-02 LAB — RAD ONC ARIA SESSION SUMMARY
Course Elapsed Days: 10
Plan Fractions Treated to Date: 8
Plan Fractions Treated to Date: 8
Plan Prescribed Dose Per Fraction: 2.5 Gy
Plan Prescribed Dose Per Fraction: 3 Gy
Plan Total Fractions Prescribed: 10
Plan Total Fractions Prescribed: 10
Plan Total Prescribed Dose: 25 Gy
Plan Total Prescribed Dose: 30 Gy
Reference Point Dosage Given to Date: 20 Gy
Reference Point Dosage Given to Date: 24 Gy
Reference Point Session Dosage Given: 2.5 Gy
Reference Point Session Dosage Given: 3 Gy
Session Number: 8

## 2022-08-02 NOTE — Telephone Encounter (Signed)
Called patient and let him know about the FCE and he is going to look into it

## 2022-08-02 NOTE — Telephone Encounter (Signed)
Pt called in stating he is applying for long term disability and needs some clinical questions answered.

## 2022-08-02 NOTE — Telephone Encounter (Signed)
Talked to [place] and he is asking if Dr. Carles Collet could write a letter to amend  to his disability company that he will pick up stating that the patient isn't able to work full time or how many hours a day she believes he can work. His PD is progressing and he is on short term disability but feels it is going to go long term with him feeling like he isn't able to focus or do his work like he was before

## 2022-08-03 ENCOUNTER — Other Ambulatory Visit: Payer: Self-pay

## 2022-08-03 ENCOUNTER — Ambulatory Visit
Admission: RE | Admit: 2022-08-03 | Discharge: 2022-08-03 | Disposition: A | Discharge status: Home / Self Care | Payer: Commercial Managed Care - PPO | Source: Ambulatory Visit | Attending: Radiation Oncology | Admitting: Radiation Oncology

## 2022-08-03 DIAGNOSIS — C8194 Hodgkin lymphoma, unspecified, lymph nodes of axilla and upper limb: Secondary | ICD-10-CM | POA: Diagnosis not present

## 2022-08-03 LAB — RAD ONC ARIA SESSION SUMMARY
Course Elapsed Days: 11
Plan Fractions Treated to Date: 9
Plan Fractions Treated to Date: 9
Plan Prescribed Dose Per Fraction: 2.5 Gy
Plan Prescribed Dose Per Fraction: 3 Gy
Plan Total Fractions Prescribed: 10
Plan Total Fractions Prescribed: 10
Plan Total Prescribed Dose: 25 Gy
Plan Total Prescribed Dose: 30 Gy
Reference Point Dosage Given to Date: 22.5 Gy
Reference Point Dosage Given to Date: 27 Gy
Reference Point Session Dosage Given: 2.5 Gy
Reference Point Session Dosage Given: 3 Gy
Session Number: 9

## 2022-08-06 ENCOUNTER — Other Ambulatory Visit: Payer: Self-pay

## 2022-08-06 ENCOUNTER — Other Ambulatory Visit: Payer: Self-pay | Admitting: Neurology

## 2022-08-06 ENCOUNTER — Ambulatory Visit
Admission: RE | Admit: 2022-08-06 | Discharge: 2022-08-06 | Disposition: A | Discharge status: Home / Self Care | Payer: Commercial Managed Care - PPO | Source: Ambulatory Visit | Attending: Radiation Oncology | Admitting: Radiation Oncology

## 2022-08-06 ENCOUNTER — Encounter: Payer: Self-pay | Admitting: Radiation Oncology

## 2022-08-06 DIAGNOSIS — G20 Parkinson's disease: Secondary | ICD-10-CM

## 2022-08-06 DIAGNOSIS — C8194 Hodgkin lymphoma, unspecified, lymph nodes of axilla and upper limb: Secondary | ICD-10-CM | POA: Diagnosis not present

## 2022-08-06 DIAGNOSIS — G20A1 Parkinson's disease without dyskinesia, without mention of fluctuations: Secondary | ICD-10-CM

## 2022-08-06 LAB — RAD ONC ARIA SESSION SUMMARY
Course Elapsed Days: 14
Plan Fractions Treated to Date: 10
Plan Fractions Treated to Date: 10
Plan Prescribed Dose Per Fraction: 2.5 Gy
Plan Prescribed Dose Per Fraction: 3 Gy
Plan Total Fractions Prescribed: 10
Plan Total Fractions Prescribed: 10
Plan Total Prescribed Dose: 25 Gy
Plan Total Prescribed Dose: 30 Gy
Reference Point Dosage Given to Date: 25 Gy
Reference Point Dosage Given to Date: 30 Gy
Reference Point Session Dosage Given: 2.5 Gy
Reference Point Session Dosage Given: 3 Gy
Session Number: 10

## 2022-08-09 ENCOUNTER — Telehealth: Payer: Self-pay | Admitting: *Deleted

## 2022-08-09 ENCOUNTER — Encounter: Payer: Self-pay | Admitting: Hematology & Oncology

## 2022-08-09 NOTE — Telephone Encounter (Signed)
Call received from patient wanting to know if it is ok for him to get the Covid, Flu and RSV vaccine and to inform Dr. Marin Olp that next CT scan will be in December per order of Dr. Sondra Come.  Dr. Marin Olp notified.  Call placed back to patient and message left to notify pt per order of Dr. Marin Olp that is is OK for him to get the Covid, Flu and RSV vaccine and that Dr. Marin Olp will discuss future scans with him at his appt on 08/21/22.  Instructed pt to call back with any further questions or concerns.

## 2022-08-14 ENCOUNTER — Other Ambulatory Visit: Payer: Self-pay | Admitting: Neurology

## 2022-08-14 DIAGNOSIS — G20 Parkinson's disease: Secondary | ICD-10-CM

## 2022-08-21 ENCOUNTER — Other Ambulatory Visit (HOSPITAL_BASED_OUTPATIENT_CLINIC_OR_DEPARTMENT_OTHER): Payer: Self-pay

## 2022-08-21 ENCOUNTER — Encounter: Payer: Self-pay | Admitting: Hematology & Oncology

## 2022-08-21 ENCOUNTER — Inpatient Hospital Stay: Payer: Commercial Managed Care - PPO | Attending: Hematology & Oncology

## 2022-08-21 ENCOUNTER — Inpatient Hospital Stay (HOSPITAL_BASED_OUTPATIENT_CLINIC_OR_DEPARTMENT_OTHER): Payer: Commercial Managed Care - PPO | Admitting: Hematology & Oncology

## 2022-08-21 VITALS — BP 124/83 | HR 78 | Temp 98.1°F | Resp 18 | Wt 178.5 lb

## 2022-08-21 DIAGNOSIS — G20 Parkinson's disease: Secondary | ICD-10-CM | POA: Diagnosis not present

## 2022-08-21 DIAGNOSIS — C8198 Hodgkin lymphoma, unspecified, lymph nodes of multiple sites: Secondary | ICD-10-CM | POA: Insufficient documentation

## 2022-08-21 DIAGNOSIS — Z923 Personal history of irradiation: Secondary | ICD-10-CM | POA: Insufficient documentation

## 2022-08-21 DIAGNOSIS — C8195 Hodgkin lymphoma, unspecified, lymph nodes of inguinal region and lower limb: Secondary | ICD-10-CM | POA: Diagnosis not present

## 2022-08-21 DIAGNOSIS — Z9484 Stem cells transplant status: Secondary | ICD-10-CM | POA: Insufficient documentation

## 2022-08-21 LAB — CBC WITH DIFFERENTIAL (CANCER CENTER ONLY)
Abs Immature Granulocytes: 0.02 10*3/uL (ref 0.00–0.07)
Basophils Absolute: 0.1 10*3/uL (ref 0.0–0.1)
Basophils Relative: 1 %
Eosinophils Absolute: 0.1 10*3/uL (ref 0.0–0.5)
Eosinophils Relative: 2 %
HCT: 39.7 % (ref 39.0–52.0)
Hemoglobin: 12.9 g/dL — ABNORMAL LOW (ref 13.0–17.0)
Immature Granulocytes: 0 %
Lymphocytes Relative: 15 %
Lymphs Abs: 0.7 10*3/uL (ref 0.7–4.0)
MCH: 32.3 pg (ref 26.0–34.0)
MCHC: 32.5 g/dL (ref 30.0–36.0)
MCV: 99.3 fL (ref 80.0–100.0)
Monocytes Absolute: 0.7 10*3/uL (ref 0.1–1.0)
Monocytes Relative: 14 %
Neutro Abs: 3.2 10*3/uL (ref 1.7–7.7)
Neutrophils Relative %: 68 %
Platelet Count: 204 10*3/uL (ref 150–400)
RBC: 4 MIL/uL — ABNORMAL LOW (ref 4.22–5.81)
RDW: 13 % (ref 11.5–15.5)
WBC Count: 4.7 10*3/uL (ref 4.0–10.5)
nRBC: 0 % (ref 0.0–0.2)

## 2022-08-21 LAB — CMP (CANCER CENTER ONLY)
ALT: 5 U/L (ref 0–44)
AST: 19 U/L (ref 15–41)
Albumin: 4.4 g/dL (ref 3.5–5.0)
Alkaline Phosphatase: 75 U/L (ref 38–126)
Anion gap: 6 (ref 5–15)
BUN: 33 mg/dL — ABNORMAL HIGH (ref 8–23)
CO2: 28 mmol/L (ref 22–32)
Calcium: 9.8 mg/dL (ref 8.9–10.3)
Chloride: 104 mmol/L (ref 98–111)
Creatinine: 1.37 mg/dL — ABNORMAL HIGH (ref 0.61–1.24)
GFR, Estimated: 58 mL/min — ABNORMAL LOW (ref >60)
Glucose, Bld: 93 mg/dL (ref 70–99)
Potassium: 4.9 mmol/L (ref 3.5–5.1)
Sodium: 138 mmol/L (ref 135–145)
Total Bilirubin: 0.7 mg/dL (ref 0.3–1.2)
Total Protein: 6.9 g/dL (ref 6.5–8.1)

## 2022-08-21 LAB — LACTATE DEHYDROGENASE: LDH: 178 U/L (ref 98–192)

## 2022-08-21 MED ORDER — FLUARIX QUADRIVALENT 0.5 ML IM SUSY
PREFILLED_SYRINGE | INTRAMUSCULAR | 0 refills | Status: DC
  Filled 2022-08-21: qty 0.5, 1d supply, fill #0

## 2022-08-21 NOTE — Progress Notes (Signed)
Hematology and Oncology Follow Up Visit  Brian [name] [id number] 1960/12/22 61 y.o. 08/21/2022   Principle Diagnosis:  Recurrent Hodgkin's Disease -  S/p CAR-T therapy -- relapsed TIA-resolved  Current Therapy:  Nivolumab q 3 month -- s/p cycle #18 -- on hold XRT -- Involved field --completed on 08/06/2022        Interim History:  Brian [name] is in for follow-up.  He looks good.  He completed his radiation therapy a couple weeks ago.  I am very happy about this.  I think he got 10 treatments in the right axilla.  He has had no problems with nausea or vomiting.  His main problem has been the Parkinson's.  He does see neurology for this.  He has had no nausea or vomiting.  He has had no change in bowel or bladder habits.  He has had no cough or shortness of breath.  There has been no bleeding.  He has had no leg swelling.  Overall, I was his performance status is probably ECOG 1.   Medications:  Current Outpatient Medications:    carbidopa-levodopa (SINEMET CR) 50-200 MG tablet, TAKE 1 TABLET BY MOUTH AT BEDTIME, Disp: 90 tablet, Rfl: 0   carbidopa-levodopa (SINEMET IR) 25-100 MG tablet, TAKE 2 TABLETS AT 7AM, 11AM, 3PM, AND AT 7PM DAILY., Disp: 720 tablet, Rfl: 0   cholecalciferol (VITAMIN D3) 25 MCG (1000 UNIT) tablet, Take 1,000 Units by mouth daily., Disp: , Rfl:    ibuprofen (ADVIL) 200 MG tablet, Take 200 mg by mouth every 6 (six) hours as needed for moderate pain., Disp: , Rfl:    olmesartan (BENICAR) 20 MG tablet, TAKE 1 TABLET(20 MG) BY MOUTH DAILY, Disp: 30 tablet, Rfl: 3   pramipexole (MIRAPEX) 0.5 MG tablet, TAKE 1 TABLET(0.5 MG) BY MOUTH THREE TIMES DAILY, Disp: 270 tablet, Rfl: 1   acetaminophen (TYLENOL) 500 MG tablet, Take 1,000 mg by mouth every 8 (eight) hours as needed for moderate pain. (Patient not taking: Reported on 08/21/2022), Disp: , Rfl:    traMADol (ULTRAM) 50 MG tablet, Take 1-2 tablets (50-100 mg total) by mouth every 6 (six) hours as needed for moderate pain.  (Patient not taking: Reported on 07/10/2022), Disp: 25 tablet, Rfl: 0  Allergies: No Known Allergies  Past Medical History, Surgical history, Social history, and Family History were reviewed and updated.  Review of Systems: Review of Systems  Neurological:  Positive for tremors.  All other systems reviewed and are negative.    Physical Exam:  weight is 178 lb 8 oz (81 kg). His oral temperature is 98.1 F (36.7 C). His blood pressure is 124/83 and his pulse is 78. His respiration is 18 and oxygen saturation is 98%.   Physical Exam Vitals reviewed.  HENT:     Head: Normocephalic and atraumatic.  Eyes:     Pupils: Pupils are equal, round, and reactive to light.  Cardiovascular:     Rate and Rhythm: Normal rate and regular rhythm.     Heart sounds: Normal heart sounds.  Pulmonary:     Effort: Pulmonary effort is normal.     Breath sounds: Normal breath sounds.  Abdominal:     General: Bowel sounds are normal.     Palpations: Abdomen is soft.  Musculoskeletal:        General: No tenderness or deformity. Normal range of motion.     Cervical back: Normal range of motion.  Lymphadenopathy:     Cervical: No cervical adenopathy.  Skin:  General: Skin is warm and dry.     Findings: No erythema or rash.  Neurological:     Mental Status: He is alert and oriented to person, place, and time.  Psychiatric:        Behavior: Behavior normal.        Thought Content: Thought content normal.        Judgment: Judgment normal.     Lab Results  Component Value Date   WBC 4.7 08/21/2022   HGB 12.9 (L) 08/21/2022   HCT 39.7 08/21/2022   MCV 99.3 08/21/2022   PLT 204 08/21/2022     Chemistry      Component Value Date/Time   NA 138 08/21/2022 0850   NA 142 04/02/2017 0000   NA 140 07/19/2016 1305   K 4.9 08/21/2022 0850   K 4.4 07/19/2016 1305   CL 104 08/21/2022 0850   CL 105 02/08/2016 1117   CL 107 03/25/2013 0828   CO2 28 08/21/2022 0850   CO2 26 07/19/2016 1305   BUN  33 (H) 08/21/2022 0850   BUN 21 04/02/2017 0000   BUN 22.8 07/19/2016 1305   CREATININE 1.37 (H) 08/21/2022 0850   CREATININE 1.1 07/19/2016 1305   GLU 105 04/02/2017 0000      Component Value Date/Time   CALCIUM 9.8 08/21/2022 0850   CALCIUM 9.8 07/19/2016 1305   ALKPHOS 75 08/21/2022 0850   ALKPHOS 76 07/19/2016 1305   AST 19 08/21/2022 0850   AST 18 07/19/2016 1305   ALT 5 08/21/2022 0850   ALT 14 07/19/2016 1305   BILITOT 0.7 08/21/2022 0850   BILITOT 0.70 07/19/2016 1305      Impression and Plan: Brian [name] is a 61 year old gentleman.  He has history of recurrent Hodgkin's disease. 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 He then had a recurrence after this.  He did undergo CAR-T therapy at [facility].  I believe he had this in April 2017.  He did well with this.  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  This was proven by biopsy.  He had radiation therapy for this.  He now has another recurrence.  Again this is very localized.  He did go ahead and have radiation therapy to the area.  We will go ahead and set him up with a PET scan in November.  We will get this before Thanksgiving.  I have to believe that this will be unremarkable.  I would think that at some point, he would probably have another recurrence.  This is how his Hodgkin's has was behaved.  He goes several years and then has a recurrence.  To me, his quality of life is so important.  He has a Parkinson's which she is dealing with.  I would like to hope that he will be able to be functional and not have the Parkinson's affect him.  I forgot to mention that he is now retired.  I am so happy that he is able to retire.    For right now, we will plan to get him back after he has the PET scan done.     [name], MD 9/26/20239:57 AM

## 2022-08-31 ENCOUNTER — Telehealth: Payer: Self-pay | Admitting: Family Medicine

## 2022-08-31 NOTE — Telephone Encounter (Signed)
Patient wants to know if Dr. Lorelei Pont recommends him getting the RSV and new Covid booster. Please call to advise.

## 2022-08-31 NOTE — Telephone Encounter (Signed)
Okay for both injections?

## 2022-09-03 NOTE — Telephone Encounter (Signed)
Spoke to patient and gave Dr. Lillie Fragmin message. He stated he understood and would get them.

## 2022-09-04 ENCOUNTER — Encounter: Payer: Self-pay | Admitting: Radiation Oncology

## 2022-09-04 ENCOUNTER — Encounter: Payer: Self-pay | Admitting: Family Medicine

## 2022-09-05 NOTE — Progress Notes (Incomplete)
  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   Recurrent Hodgkin's Disease -  S/p salvage chemotherapy, autologous stem cell transplant in May of 2014, CAR-T therapy at [facility] in 2017, and radiation - recent recurrence PET scan 05/07/22   Parkinson's disease - followed by [facility] and Dr. Carles Collet  Intent: Curative  Radiation Treatment Dates: 07/23/2022 through 08/06/2022 Site Technique Total Dose (Gy) Dose per Fx (Gy) Completed Fx Beam Energies  Sclav-RT: SCV_R Complex 25/25 2.5 10/10 6X, 10X  Hip, Right: Pelvis_R Complex 30/30 3 10/10 10X, 15X   Narrative: The patient tolerated radiation therapy relatively well. On the date of his final treatment, the patient reported soreness in both hips that started a week prior, and fatigue (related to his parkinson's disease).  Physical exam revealed mild erythema noted to the treatment site along the right axillary region.  Surgical scar otherwise appeared well-healed.   Plan: The patient will follow-up with radiation oncology in one month .  ______________________________________________ -----------------------------------  Blair Promise, PhD, MD  This document serves as a record of services personally performed by Gery Pray, MD. It was created on his behalf by Roney Mans, a trained medical scribe. The creation of this record is based on the scribe's personal observations and the provider's statements to them. This document has been checked and approved by the attending provider.

## 2022-09-05 NOTE — Progress Notes (Signed)
Radiation Oncology         [phone] ________________________________  Name: Brian [name] MRN: [id number]  Date: 09/06/2022  DOB: 1961/07/28  Follow-Up Visit Note  CC: Copland, [name], MD  [name], MD    ICD-10-CM   1. Hodgkin's disease of inguinal region or lower limb ([place])  C81.95       Diagnosis:   The encounter diagnosis was Hodgkin's disease.   Recurrent Hodgkin's Disease -  S/p salvage chemotherapy, autologous stem cell transplant in May of 2014, CAR-T therapy at [facility] in 2017, and radiation - recent recurrence PET scan 05/07/22   Parkinson's disease - followed by [facility] and Dr. Carles Collet   Interval Since Last Radiation: 1 month and 1 day  Radiation Treatment Dates: 07/23/2022 through 08/06/2022 Site Technique Total Dose (Gy) Dose per Fx (Gy) Completed Fx Beam Energies  Sclav-RT: SCV_R Complex 25/25 2.5 10/10 6X, 10X  Hip, Right: Pelvis_R Complex 30/30 3 10/10 10X, 15X    Narrative:  The patient returns today for routine follow-up.  The patient tolerated radiation therapy relatively well. On the date of his final treatment, the patient reported soreness in both hips that started a week prior, and fatigue (related to his parkinson's disease).  Physical exam revealed mild erythema noted to the treatment site along the right axillary region. Surgical scar otherwise appeared well-healed.          Since completing radiation, the patient followed up with Dr. Marin Olp on 08/21/22. During which time, the patient denied any symptoms or concerns other than symptoms from his parkinson's disease which he is followed by neurology for. Moving forward, Dr. Marin Olp would like to get him in for a PET scan in November. Following his PET scan, the patient will return to Dr. Marin Olp for routine follow-up and to review results.                    Otherwise, no significant interval history since the patient completed radiation.  He reports significant low back pain upon awakening which  improves with stretching exercises and movement.  Over the past month his Parkinson's seems to be stable.  He denies any skin issues after completed his radiation therapy.  Allergies:  has No Known Allergies.  Meds: Current Outpatient Medications  Medication Sig Dispense Refill   acetaminophen (TYLENOL) 500 MG tablet Take 1,000 mg by mouth every 8 (eight) hours as needed for moderate pain.     carbidopa-levodopa (SINEMET CR) 50-200 MG tablet TAKE 1 TABLET BY MOUTH AT BEDTIME 90 tablet 0   carbidopa-levodopa (SINEMET IR) 25-100 MG tablet TAKE 2 TABLETS AT 7AM, 11AM, 3PM, AND AT 7PM DAILY. 720 tablet 0   cholecalciferol (VITAMIN D3) 25 MCG (1000 UNIT) tablet Take 1,000 Units by mouth daily.     ibuprofen (ADVIL) 200 MG tablet Take 200 mg by mouth every 6 (six) hours as needed for moderate pain.     influenza vac split quadrivalent PF (FLUARIX QUADRIVALENT) 0.5 ML injection Inject into the muscle. 0.5 mL 0   olmesartan (BENICAR) 20 MG tablet TAKE 1 TABLET(20 MG) BY MOUTH DAILY 30 tablet 3   pramipexole (MIRAPEX) 0.5 MG tablet TAKE 1 TABLET(0.5 MG) BY MOUTH THREE TIMES DAILY 270 tablet 1   No current facility-administered medications for this encounter.    Physical Findings: The patient is in no acute distress. Patient is alert and oriented.  height is '5\' 10"'$  (1.778 m) and weight is 178 lb 12.8 oz (81.1 kg).  His temperature is 97.8 F (36.6 C). His blood pressure is 91/72 and his pulse is 88. His respiration is 18 and oxygen saturation is 98%. .  Lungs are clear to auscultation bilaterally. Heart has regular rate and rhythm. No palpable cervical, supraclavicular, or axillary adenopathy. Abdomen soft, non-tender, normal bowel sounds.  Examination of the right axillary area reveals mild hyperpigmentation changes.  No skin breakdown.  No palpable adenopathy.  Examination of the buttocks region reveals no appreciable skin changes.   Lab Findings: Lab Results  Component Value Date   WBC 4.7  08/21/2022   HGB 12.9 (L) 08/21/2022   HCT 39.7 08/21/2022   MCV 99.3 08/21/2022   PLT 204 08/21/2022    Radiographic Findings: No results found.  Impression:  The encounter diagnosis was Hodgkin's disease.   Recurrent Hodgkin's Disease -  S/p salvage chemotherapy, autologous stem cell transplant in May of 2014, CAR-T therapy at [facility] in 2017, and radiation - recent recurrence PET scan 05/07/22   Parkinson's disease - followed by [facility] and Dr. Carles Collet  He has recovered well from his radiation therapy.  Actually tolerated this treatment quite well.  Plan: As needed follow-up in radiation oncology.  As above he will undergo PET scan in November and then see Dr. Marin Olp soon afterwards for review.    ____________________________________  Blair Promise, PhD, MD  This document serves as a record of services personally performed by Gery Pray, MD. It was created on his behalf by Roney Mans, a trained medical scribe. The creation of this record is based on the scribe's personal observations and the provider's statements to them. This document has been checked and approved by the attending provider.

## 2022-09-06 ENCOUNTER — Encounter: Payer: Self-pay | Admitting: Radiation Oncology

## 2022-09-06 ENCOUNTER — Ambulatory Visit
Admission: RE | Admit: 2022-09-06 | Discharge: 2022-09-06 | Disposition: A | Discharge status: Home / Self Care | Payer: Commercial Managed Care - PPO | Source: Ambulatory Visit | Attending: Radiation Oncology | Admitting: Radiation Oncology

## 2022-09-06 DIAGNOSIS — C8195 Hodgkin lymphoma, unspecified, lymph nodes of inguinal region and lower limb: Secondary | ICD-10-CM | POA: Diagnosis not present

## 2022-09-06 NOTE — Progress Notes (Signed)
Brian [name] is here today for follow up post radiation to the pelvic area and right SCV.  They completed their radiation on: 08/06/22   Does the patient complain of any of the following:  Pain: Patient denies pain to treatment field. Reports having back pain rating 7/10.  Diarrhea/Constipation: No Nausea/Vomiting: No Blood in Urine or Stool: No Urinary Issues (dysuria/incomplete emptying/ incontinence/ increased frequency/urgency): Yes, nocturia  Post radiation skin changes: No Issues with ROM: Yes, due to fall last year. Continues to have issues with right rotator cuff.    Additional comments if applicable:    BP 53/64 (BP Location: Left Arm, Patient Position: Sitting, Cuff Size: Large)   Pulse 88   Temp 97.8 F (36.6 C)   Resp 18   Ht '5\' 10"'$  (1.778 m)   Wt 178 lb 12.8 oz (81.1 kg)   SpO2 98%   BMI 25.66 kg/m

## 2022-09-10 ENCOUNTER — Encounter: Payer: Self-pay | Admitting: Hematology & Oncology

## 2022-09-11 ENCOUNTER — Telehealth: Payer: Self-pay | Admitting: Family Medicine

## 2022-09-11 NOTE — Telephone Encounter (Signed)
Appt tomorrow w/ Melissa.  

## 2022-09-11 NOTE — Telephone Encounter (Signed)
Pt states he is very lightheaded and his vision keeps going white. Transferred to triage.

## 2022-09-11 NOTE — Telephone Encounter (Signed)
urse Assessment Nurse: Fredderick Phenix, RN, Lelan Pons Date/Time Eilene Ghazi Time): 09/11/2022 2:34:49 PM Confirm and document reason for call. If symptomatic, describe symptoms. ---Caller states he's been feeling lightheaded and seeing white lights, thinks he's been having low blood pressure. Does the patient have any new or worsening symptoms? ---Yes Will a triage be completed? ---Yes Related visit to physician within the last 2 weeks? ---No Does the PT have any chronic conditions? (i.e. diabetes, asthma, this includes High risk factors for pregnancy, etc.) ---Yes List chronic conditions. ---Parkinson's, lymphoma, HTN Is this a behavioral health or substance abuse call? ---No Guidelines Guideline Title Affirmed Question Affirmed Notes Nurse Date/Time (Eastern Time) Blood Pressure - Low [2] Fall in systolic BP > 20 mm Hg from normal AND [2] dizzy, lightheaded, or weak Fredderick Phenix, RN, Lelan Pons 09/11/2022 2:40:03 PM Disp. Time Eilene Ghazi Time) Disposition Final User 09/11/2022 2:45:58 PM Go to ED Now (or PCP triage) Yes Fredderick Phenix, RN, Karie Chimera NOTE: All timestamps contained within this report are represented as [place] Standard Time. CONFIDENTIALTY NOTICE: This fax transmission is intended only for the addressee. It contains information that is legally privileged, confidential or otherwise protected from use or disclosure. If you are not the intended recipient, you are strictly prohibited from reviewing, disclosing, copying using or disseminating any of this information or taking any action in reliance on or regarding this information. If you have received this fax in error, please notify us immediately by telephone so that we can arrange for its return to [place]. Phone: [phone], Toll-Free: [phone], Fax: [phone] Page: 2 of 2 Call Id: [id number] Final Disposition 09/11/2022 2:45:58 PM Go to ED Now (or PCP triage) Yes Fredderick Phenix, RN, [place] Disagree/Comply Disagree Caller Understands  Yes PreDisposition Did not know what to do Care Advice Given Per Guideline GO TO ED NOW (OR PCP TRIAGE): * IF NO PCP (PRIMARY CARE PROVIDER) SECOND-LEVEL TRIAGE: You need to be seen within the next hour. Go to the [facility] at _____________ [address] [place] as soon as you can. ANOTHER ADULT SHOULD DRIVE: * It is better and safer if another adult drives instead of you. CARE ADVICE given per Low Blood Pressure (Adult) guideline. Referrals GO TO FACILITY REFUSED

## 2022-09-11 NOTE — Telephone Encounter (Signed)
Triage nurse called to see if we would be able to get in because patient did not want to go ER.  Patient has been scheduled with Melissa for tomorrow and advised patient to go to ER if he gets worse.   Will attach triage note when we get it.

## 2022-09-12 ENCOUNTER — Ambulatory Visit (INDEPENDENT_AMBULATORY_CARE_PROVIDER_SITE_OTHER): Payer: Commercial Managed Care - PPO | Admitting: Family

## 2022-09-12 ENCOUNTER — Telehealth: Payer: Self-pay | Admitting: Family Medicine

## 2022-09-12 VITALS — BP 117/79 | HR 97 | Temp 98.1°F | Resp 16 | Wt 177.0 lb

## 2022-09-12 DIAGNOSIS — I10 Essential (primary) hypertension: Secondary | ICD-10-CM

## 2022-09-12 MED ORDER — OLMESARTAN MEDOXOMIL 20 MG PO TABS: 10.0000 mg | ORAL_TABLET | Freq: Every day | ORAL | 3 refills | Status: DC

## 2022-09-12 NOTE — Progress Notes (Signed)
Subjective:   By signing my name below, I, Brian [name], attest that this documentation has been prepared under the direction and in the presence of Brian [name], 09/12/2022.   Patient ID: Brian [name], male    DOB: 1961-01-31, 61 y.o.   MRN: [id number]  Chief Complaint  Patient presents with   Hypertension    Patient reports fluctuating BP readings   Dizziness    Patient reports dizziness  on and off    HPI Patient is in today for an office visit.Pmhx is significant for recurrent Hodgkin's lymphoma and Parkinsons.   Hypertension- Patient complains of lightheadedness occasionally especially in heat, but denies chest pains. He is complaint with Olmesartan medication for blood pressure. He is requesting a lower dosage as it causes him to become dizzy.   BP Readings from Last 3 Encounters:  09/12/22 117/79  09/06/22 91/72  08/21/22 124/83   Pulse Readings from Last 3 Encounters:  09/12/22 97  09/06/22 88  08/21/22 78    Health Maintenance Due  Topic Date Due   Zoster Vaccines- Shingrix (1 of 2) Never done   COVID-19 Vaccine (4 - Pfizer risk series) 11/28/2020    Past Medical History:  Diagnosis Date   Anxiety    Dysrhythmia    past hx pvc   Goals of care, counseling/discussion 09/25/2018   H/O autologous stem cell transplant ([place])    may 2014  at [place]   History of Bell's palsy    2009  RIGHT SIDE--  HAS 80% FUNCTION / PT STATES A LITTLE ASYMETRICAL AND EFFECTS MOUTH   History of radiation therapy 10/18/17-11/05/17   sprine T1 26 Gy in 13 fractions, spine boost 10 Gy in 5 fractions   History of radiation therapy    Right hip, Rt Sclav- 07/23/22-08/07/11- Dr. Gery Pray   Hodgkin's disease, nodular sclerosis, of inguinal region/lower limb ([place]) ONOLOGIST--  DR Marin Olp AND [facility]     SALVAGE CHEMO 2013/  AUTOLOGUS STEM CELL TRANSPLANT MAY 2014 AT [facility]   Hypertension    Mass of right chest wall 09/18/2018   Mass of right inguinal region     Neuromuscular disorder ([place])    bilateral feet neuropathy   PVC (premature ventricular contraction)    "benign"   TIA (transient ischemic attack) 02/2015   "probable TIA"   Wears contact lenses     Past Surgical History:  Procedure Laterality Date   AXILLARY LYMPH NODE BIOPSY Left 02/02/2013   Procedure: NEEDLE LOCALIZED AXILLARY LYMPH NODE BIOPSY;  Surgeon: Haywood Lasso, MD;  Location: [place];  Service: General;  Laterality: Left;   BACK SURGERY  03/2021   BONE MARROW BIOPSY  08/26/2012   COLONOSCOPY  2021   CYST REMOVAL NECK Left 11/26/1978   LEFT INGUINAL LYMPH NODE BX  09/09/2012   LYMPH NODE BIOPSY N/A 07/28/2015   Procedure: LYMPH NODE BIOPSY;  Surgeon: Melrose Nakayama, MD;  Location: [place];  Service: Thoracic;  Laterality: N/A;   LYMPH NODE BIOPSY Right 06/29/2022   Procedure: RIGHT AXILLARY NODE BIOPSY;  Surgeon: Melrose Nakayama, MD;  Location: [place];  Service: Vascular;  Laterality: Right;   MASS EXCISION Right 09/19/2018   Procedure: EXCISION OF RIGHT CHEST WALL MASS ERAS PATHWAY;  Surgeon: Fanny Skates, MD;  Location: [place];  Service: General;  Laterality: Right;   NODE DISSECTION Right 07/28/2015   Procedure: NODE DISSECTION;  Surgeon: Melrose Nakayama, MD;  Location: [facility];  Service:  Thoracic;  Laterality: Right;   PLEURA BIOPSY Left 05/31/2014   REMOVAL RIGHT INGUINAL LYMPH NODES  08/16/2011   SCROTAL EXPLORATION Right 01/04/2014   Procedure: SCROTUM EXPLORATION   INGUINAL , EXCISION OF CYSTIC MASS OF RIGHT SPERMATIC CORD, WITH FROZEN SECTION;  Surgeon: Franchot Gallo, MD;  Location: [facility];  Service: Urology;  Laterality: Right;   TRANSTHORACIC ECHOCARDIOGRAM  03/18/2013   MILD LVH/  EF 55-60%   VIDEO ASSISTED THORACOSCOPY Left 05/31/2014   Procedure: LEFT VIDEO ASSISTED THORACOSCOPY, PLEURAL BIOPSY;  Surgeon: Melrose Nakayama, MD;  Location: [place];  Service: Thoracic;  Laterality:  Left;   VIDEO ASSISTED THORACOSCOPY Right 07/28/2015   Procedure: RIGHT VIDEO ASSISTED THORACOSCOPY;  Surgeon: Melrose Nakayama, MD;  Location: [place];  Service: Thoracic;  Laterality: Right;   VIDEO ASSISTED THORACOSCOPY (VATS)/ LYMPH NODE SAMPLING Right 07/28/2015   VIDEO ASSISTED THORACOSCOPY (VATS)/WEDGE RESECTION  05/31/2014   VIDEO BRONCHOSCOPY WITH ENDOBRONCHIAL ULTRASOUND  07/28/2015   VIDEO BRONCHOSCOPY WITH ENDOBRONCHIAL ULTRASOUND N/A 07/28/2015   Procedure: VIDEO BRONCHOSCOPY WITH ENDOBRONCHIAL ULTRASOUND;  Surgeon: Melrose Nakayama, MD;  [facility];  Service: Thoracic;  Laterality: N/A;    Family History  Problem Relation Age of Onset   Cancer Mother 31       ovarian   Ovarian cancer Mother 7   Heart disease Father    Stroke Father    Cancer Brother        testicular   Hypertension Brother    Healthy Son    Healthy Son    Hypertension Brother    Other Brother        CMT   Hypertension Brother     Social History   Socioeconomic History   Marital status: Divorced    Spouse name: Not on file   Number of children: 2   Years of education: Not on file   Highest education level: Bachelor's degree (e.g., BA, AB, BS)  Occupational History   Occupation: Oncologist: [employer]  Tobacco Use   Smoking status: Never   Smokeless tobacco: Never  Vaping Use   Vaping Use: Never used  Substance and Sexual Activity   Alcohol use: Yes    Alcohol/week: 14.0 standard drinks of alcohol    Types: 7 Glasses of wine, 7 Cans of beer per week    Comment: one drink per night   Drug use: No   Sexual activity: Not Currently  Other Topics Concern   Not on file  Social History Narrative   DIVORCE. Education: [employer]. Exercise: jog/walk/ lift weights 7 days a week, 1-2 miles.   Right handed   2 children   Social Determinants of Health   Financial Resource Strain: Not on file  Food Insecurity: Not on file  Transportation Needs: Not on file   Physical Activity: Not on file  Stress: Not on file  Social Connections: Not on file  Intimate Partner Violence: Not on file    Outpatient Medications Prior to Visit  Medication Sig Dispense Refill   acetaminophen (TYLENOL) 500 MG tablet Take 1,000 mg by mouth every 8 (eight) hours as needed for moderate pain.     carbidopa-levodopa (SINEMET CR) 50-200 MG tablet TAKE 1 TABLET BY MOUTH AT BEDTIME 90 tablet 0   carbidopa-levodopa (SINEMET IR) 25-100 MG tablet TAKE 2 TABLETS AT 7AM, 11AM, 3PM, AND AT 7PM DAILY. 720 tablet 0   cholecalciferol (VITAMIN D3) 25 MCG (1000 UNIT) tablet Take 1,000 Units  by mouth daily.     ibuprofen (ADVIL) 200 MG tablet Take 200 mg by mouth every 6 (six) hours as needed for moderate pain.     influenza vac split quadrivalent PF (FLUARIX QUADRIVALENT) 0.5 ML injection Inject into the muscle. 0.5 mL 0   pramipexole (MIRAPEX) 0.5 MG tablet TAKE 1 TABLET(0.5 MG) BY MOUTH THREE TIMES DAILY 270 tablet 1   olmesartan (BENICAR) 20 MG tablet TAKE 1 TABLET(20 MG) BY MOUTH DAILY 30 tablet 3   No facility-administered medications prior to visit.    No Known Allergies  ROS    See HPI Objective:    Physical Exam Constitutional:      General: He is not in acute distress.    Appearance: Normal appearance. He is not ill-appearing.  HENT:     Head: Normocephalic and atraumatic.     Right Ear: External ear normal.     Left Ear: External ear normal.  Eyes:     Extraocular Movements: Extraocular movements intact.     Pupils: Pupils are equal, round, and reactive to light.  Cardiovascular:     Rate and Rhythm: Normal rate and regular rhythm.     Heart sounds: Normal heart sounds. No murmur heard.    No gallop.  Pulmonary:     Effort: Pulmonary effort is normal. No respiratory distress.     Breath sounds: Normal breath sounds. No wheezing or rales.  Skin:    General: Skin is warm and dry.  Neurological:     Mental Status: He is alert and oriented to person, place,  and time.  Psychiatric:        Mood and Affect: Mood normal.        Behavior: Behavior normal.        Judgment: Judgment normal.     BP 117/79 (BP Location: Right Arm, Patient Position: Sitting, Cuff Size: Small)   Pulse 97   Temp 98.1 F (36.7 C) (Oral)   Resp 16   Wt 177 lb (80.3 kg)   SpO2 100%   BMI 25.40 kg/m  Wt Readings from Last 3 Encounters:  09/12/22 177 lb (80.3 kg)  09/06/22 178 lb 12.8 oz (81.1 kg)  08/21/22 178 lb 8 oz (81 kg)       Assessment & Plan:   Problem List Items Addressed This Visit       Unprioritized   Primary hypertension - Primary    Seem to be having some low readings/symptomatic orthostasis. Will decrease benicar from 20  to 10 mg once daily. Follow up in 2 weeks with PCP for bp recheck.      Relevant Medications   olmesartan (BENICAR) 20 MG tablet    Meds ordered this encounter  Medications   olmesartan (BENICAR) 20 MG tablet    Sig: Take 0.5 tablets (10 mg total) by mouth daily.    Dispense:  30 tablet    Refill:  3    Order Specific Question:   Supervising Provider    Answer:   Pat Patrick, personally preformed the services described in this documentation.  All medical record entries made by the scribe were at my direction and in my presence.  I have reviewed the chart and discharge instructions (if applicable) and agree that the record reflects my personal performance and is accurate and complete. 09/12/2022.  Jana Half O'Sullivan,acting as a scribe for Nance Pear, NP.,have documented all relevant documentation on the behalf of Lenna Sciara  Rowe Pavy, NP,as directed by  Nance Pear, NP while in the presence of Nance Pear, NP.    Nance Pear, NP

## 2022-09-12 NOTE — Assessment & Plan Note (Addendum)
Seem to be having some low readings/symptomatic orthostasis. Will decrease benicar from 20  to 10 mg once daily. Follow up in 2 weeks with PCP for bp recheck.

## 2022-09-12 NOTE — Telephone Encounter (Signed)
Already updated

## 2022-09-12 NOTE — Telephone Encounter (Signed)
Patient states he got the RSV shot last week and would like his profile updated. Pt did not give the exact date.

## 2022-09-21 ENCOUNTER — Other Ambulatory Visit: Payer: Self-pay | Admitting: Hematology & Oncology

## 2022-09-25 NOTE — Progress Notes (Unsigned)
[facility] [address] [place], [place] [address] [phone]  Date:  09/27/2022   Name:  Brian [name]   DOB:  25-May-1961   MRN:  [id number]  PCP:  Darreld Mclean, MD    Chief Complaint: No chief complaint on file.   History of Present Illness:  Brian [name] is a 61 y.o. very pleasant male patient who presents with the following:  Patient seen today for short-term follow-up History of recurrent Hodgkin's disease status post stem cell transplant in 2014 and then relapse in 2017 He also has history of Parkinson's disease, mild hypertension Most recent visit with myself was a virtual visit in June 2022, although we have exchanged several messages and phone calls since then He was seen by my partner [name] 10/18 with concern of low blood pressure.  They decreased his olmesartan from 20 to 10 mg daily  Patient Active Problem List   Diagnosis Date Noted   Primary hypertension 09/12/2022   Closed fracture of right proximal humerus 10/03/2021   Traumatic complete tear of right rotator cuff 10/03/2021   Lumbar radiculopathy 10/03/2021   Goals of care, counseling/discussion 09/25/2018   Mass of right chest wall 09/18/2018   PD (Parkinson's disease) 11/27/2016   Lymphadenopathy 07/28/2015   Mediastinal adenopathy 07/14/2015   Aphasia 03/04/2015   Slurred speech 03/04/2015   Pleural mass 05/31/2014   Abdominal pain, epigastric 02/02/2014   Weight loss 08/12/2012   H/O Bell's palsy 08/12/2012   Hodgkin's disease 08/09/2011    Past Medical History:  Diagnosis Date   Anxiety    Dysrhythmia    past hx pvc   Goals of care, counseling/discussion 09/25/2018   H/O autologous stem cell transplant ([place])    may 2014  at [place]   History of Bell's palsy    2009  RIGHT SIDE--  HAS 80% FUNCTION / PT STATES A LITTLE ASYMETRICAL AND EFFECTS MOUTH   History of radiation therapy 10/18/17-11/05/17   sprine T1 26 Gy  in 13 fractions, spine boost 10 Gy in 5 fractions   History of radiation therapy    Right hip, Rt Sclav- 07/23/22-08/07/11- Dr. Gery Pray   Hodgkin's disease, nodular sclerosis, of inguinal region/lower limb ([place]) ONOLOGIST--  DR Marin Olp AND A [facility]     SALVAGE CHEMO 2013/  AUTOLOGUS STEM CELL TRANSPLANT MAY 2014 AT [facility]   Hypertension    Mass of right chest wall 09/18/2018   Mass of right inguinal region    Neuromuscular disorder ([place])    bilateral feet neuropathy   PVC (premature ventricular contraction)    "benign"   TIA (transient ischemic attack) 02/2015   "probable TIA"   Wears contact lenses     Past Surgical History:  Procedure Laterality Date   AXILLARY LYMPH NODE BIOPSY Left 02/02/2013   Procedure: NEEDLE LOCALIZED AXILLARY LYMPH NODE BIOPSY;  Surgeon: Haywood Lasso, MD;  Location: [place];  Service: General;  Laterality: Left;   BACK SURGERY  03/2021   BONE MARROW BIOPSY  08/26/2012   COLONOSCOPY  2021   CYST REMOVAL NECK Left 11/26/1978   LEFT INGUINAL LYMPH NODE BX  09/09/2012   LYMPH NODE BIOPSY N/A 07/28/2015   Procedure: LYMPH NODE BIOPSY;  Surgeon: Melrose Nakayama, MD;  Location: [place];  Service: Thoracic;  Laterality: N/A;   LYMPH NODE BIOPSY Right 06/29/2022   Procedure: RIGHT AXILLARY NODE BIOPSY;  Surgeon: Modesto Charon  C, MD;  Location: [place];  Service: Vascular;  Laterality: Right;   MASS EXCISION Right 09/19/2018   Procedure: EXCISION OF RIGHT CHEST WALL MASS ERAS PATHWAY;  Surgeon: Fanny Skates, MD;  Location: [place];  Service: General;  Laterality: Right;   NODE DISSECTION Right 07/28/2015   Procedure: NODE DISSECTION;  Surgeon: Melrose Nakayama, MD;  Location: [place];  Service: Thoracic;  Laterality: Right;   PLEURA BIOPSY Left 05/31/2014   REMOVAL RIGHT INGUINAL LYMPH NODES  08/16/2011   SCROTAL EXPLORATION Right 01/04/2014   Procedure: SCROTUM EXPLORATION   INGUINAL , EXCISION OF CYSTIC MASS  OF RIGHT SPERMATIC CORD, [place];  Surgeon: Franchot Gallo, MD;  Location: [facility];  Service: Urology;  Laterality: Right;   TRANSTHORACIC ECHOCARDIOGRAM  03/18/2013   MILD LVH/  EF 55-60%   VIDEO ASSISTED THORACOSCOPY Left 05/31/2014   Procedure: LEFT VIDEO ASSISTED THORACOSCOPY, PLEURAL BIOPSY;  Surgeon: Melrose Nakayama, MD;  Location: [place];  Service: Thoracic;  Laterality: Left;   VIDEO ASSISTED THORACOSCOPY Right 07/28/2015   Procedure: RIGHT VIDEO ASSISTED THORACOSCOPY;  Surgeon: Melrose Nakayama, MD;  Location: [place];  Service: Thoracic;  Laterality: Right;   VIDEO ASSISTED THORACOSCOPY (VATS)/ LYMPH NODE SAMPLING Right 07/28/2015   VIDEO ASSISTED THORACOSCOPY (VATS)/WEDGE RESECTION  05/31/2014   VIDEO BRONCHOSCOPY WITH ENDOBRONCHIAL ULTRASOUND  07/28/2015   VIDEO BRONCHOSCOPY WITH ENDOBRONCHIAL ULTRASOUND N/A 07/28/2015   Procedure: VIDEO BRONCHOSCOPY WITH ENDOBRONCHIAL ULTRASOUND;  Surgeon: Melrose Nakayama, MD;  [facility];  Service: Thoracic;  Laterality: N/A;    Social History   Tobacco Use   Smoking status: Never   Smokeless tobacco: Never  Vaping Use   Vaping Use: Never used  Substance Use Topics   Alcohol use: Yes    Alcohol/week: 14.0 standard drinks of alcohol    Types: 7 Glasses of wine, 7 Cans of beer per week    Comment: one drink per night   Drug use: No    Family History  Problem Relation Age of Onset   Cancer Mother 58       ovarian   Ovarian cancer Mother 25   Heart disease Father    Stroke Father    Cancer Brother        testicular   Hypertension Brother    Healthy Son    Healthy Son    Hypertension Brother    Other Brother        CMT   Hypertension Brother     No Known Allergies  Medication list has been reviewed and updated.  Current Outpatient Medications on File Prior to Visit  Medication Sig Dispense Refill   acetaminophen (TYLENOL) 500 MG tablet Take 1,000 mg by mouth every 8  (eight) hours as needed for moderate pain.     carbidopa-levodopa (SINEMET CR) 50-200 MG tablet TAKE 1 TABLET BY MOUTH AT BEDTIME 90 tablet 0   carbidopa-levodopa (SINEMET IR) 25-100 MG tablet TAKE 2 TABLETS AT 7AM, 11AM, 3PM, AND AT 7PM DAILY. 720 tablet 0   cholecalciferol (VITAMIN D3) 25 MCG (1000 UNIT) tablet Take 1,000 Units by mouth daily.     ibuprofen (ADVIL) 200 MG tablet Take 200 mg by mouth every 6 (six) hours as needed for moderate pain.     influenza vac split quadrivalent PF (FLUARIX QUADRIVALENT) 0.5 ML injection Inject into the muscle. 0.5 mL 0   olmesartan (BENICAR) 20 MG tablet TAKE 1 TABLET(20 MG) BY MOUTH DAILY 30 tablet 3  pramipexole (MIRAPEX) 0.5 MG tablet TAKE 1 TABLET(0.5 MG) BY MOUTH THREE TIMES DAILY 270 tablet 1   No current facility-administered medications on file prior to visit.    Review of Systems:  As per HPI- otherwise negative.   Physical Examination: There were no vitals filed for this visit. There were no vitals filed for this visit. There is no height or weight on file to calculate BMI. Ideal Body Weight:    GEN: no acute distress. HEENT: Atraumatic, Normocephalic.  Ears and Nose: No external deformity. CV: RRR, No M/G/R. No JVD. No thrill. No extra heart sounds. PULM: CTA B, no wheezes, crackles, rhonchi. No retractions. No resp. distress. No accessory muscle use. ABD: S, NT, ND, +BS. No rebound. No HSM. EXTR: No c/c/e PSYCH: Normally interactive. Conversant.    Assessment and Plan: ***  Signed Lamar Blinks, MD

## 2022-09-26 ENCOUNTER — Telehealth: Payer: Self-pay | Admitting: Physical Therapy

## 2022-09-26 ENCOUNTER — Telehealth: Payer: Self-pay | Admitting: Neurology

## 2022-09-26 DIAGNOSIS — Z0279 Encounter for issue of other medical certificate: Secondary | ICD-10-CM

## 2022-09-26 DIAGNOSIS — G20B2 Parkinson's disease with dyskinesia, with fluctuations: Secondary | ICD-10-CM

## 2022-09-26 NOTE — Telephone Encounter (Signed)
Patient called and requested to speak with [facility] ([facility]), he is bringing a form by to update his condition. FYI only, no call back needed. Patient will bring by form.

## 2022-09-26 NOTE — Telephone Encounter (Signed)
Dr. Carles Collet,  Brian [name] is scheduled for  PT return evaluation in November 2023 as recommended when he was last discharged from therapy due to progressive nature of diagnosis.  Pt was in agreement with this plan.  If you are in agreement, please send updated order for PT via epic.  Thank you, Mady Haagensen, PT 09/26/22 3:40 PM Phone: [phone] Fax: [phone]  [facility] at [facility] [place], [place], [place] [address] Phone # [phone] Fax # [phone]

## 2022-09-27 ENCOUNTER — Ambulatory Visit (INDEPENDENT_AMBULATORY_CARE_PROVIDER_SITE_OTHER): Payer: Commercial Managed Care - PPO | Admitting: Family Medicine

## 2022-09-27 VITALS — BP 127/97 | HR 94 | Temp 97.6°F | Resp 18 | Ht 70.0 in | Wt 182.4 lb

## 2022-09-27 DIAGNOSIS — C8195 Hodgkin lymphoma, unspecified, lymph nodes of inguinal region and lower limb: Secondary | ICD-10-CM | POA: Diagnosis not present

## 2022-09-27 DIAGNOSIS — M545 Low back pain, unspecified: Secondary | ICD-10-CM | POA: Diagnosis not present

## 2022-09-27 DIAGNOSIS — M79605 Pain in left leg: Secondary | ICD-10-CM

## 2022-09-27 DIAGNOSIS — I10 Essential (primary) hypertension: Secondary | ICD-10-CM | POA: Diagnosis not present

## 2022-09-27 DIAGNOSIS — Z13220 Encounter for screening for lipoid disorders: Secondary | ICD-10-CM

## 2022-09-27 DIAGNOSIS — Z1329 Encounter for screening for other suspected endocrine disorder: Secondary | ICD-10-CM

## 2022-09-27 DIAGNOSIS — G20A2 Parkinson's disease without dyskinesia, with fluctuations: Secondary | ICD-10-CM | POA: Diagnosis not present

## 2022-09-27 DIAGNOSIS — Z131 Encounter for screening for diabetes mellitus: Secondary | ICD-10-CM

## 2022-09-27 DIAGNOSIS — Z125 Encounter for screening for malignant neoplasm of prostate: Secondary | ICD-10-CM

## 2022-09-27 MED ORDER — TRAMADOL HCL 50 MG PO TABS: 50.0000 mg | ORAL_TABLET | Freq: Three times a day (TID) | ORAL | 0 refills | Status: DC | PRN

## 2022-09-27 MED ORDER — OLMESARTAN MEDOXOMIL 20 MG PO TABS: 10.0000 mg | ORAL_TABLET | Freq: Every day | ORAL | 3 refills | Status: DC

## 2022-09-27 NOTE — Patient Instructions (Signed)
Good to see you again today- take care and I will be in touch after your labs are done per hematology Try the tramadol for pain- let me know what you think  Recommend latest covid at your convenience

## 2022-10-02 ENCOUNTER — Encounter: Payer: Self-pay | Admitting: Family Medicine

## 2022-10-03 ENCOUNTER — Encounter: Payer: Self-pay | Admitting: Family Medicine

## 2022-10-10 ENCOUNTER — Other Ambulatory Visit: Payer: Self-pay

## 2022-10-10 ENCOUNTER — Ambulatory Visit: Payer: Commercial Managed Care - PPO | Attending: Neurology | Admitting: Physical Therapy

## 2022-10-10 DIAGNOSIS — R2689 Other abnormalities of gait and mobility: Secondary | ICD-10-CM

## 2022-10-10 DIAGNOSIS — R2681 Unsteadiness on feet: Secondary | ICD-10-CM

## 2022-10-10 DIAGNOSIS — G20B2 Parkinson's disease with dyskinesia, with fluctuations: Secondary | ICD-10-CM | POA: Insufficient documentation

## 2022-10-10 NOTE — Therapy (Signed)
OUTPATIENT PHYSICAL THERAPY NEURO EVALUATION   Patient Name: Brian [name] MRN: [id number] DOB:02-22-1961, 61 y.o., male Today's Date: 10/10/2022   PCP: [name]. Copland, MD REFERRING PROVIDER: Alonza Bogus, DO   PT End of Session - 10/10/22 1721     Visit Number 1    Number of Visits 8    Date for PT Re-Evaluation 11/09/22    Authorization Type UMR    PT Start Time 3335    PT Stop Time 4562    PT Time Calculation (min) 42 min    Activity Tolerance Patient tolerated treatment well    Behavior During Therapy WFL for tasks assessed/performed             Past Medical History:  Diagnosis Date   Anxiety    Dysrhythmia    past hx pvc   Goals of care, counseling/discussion 09/25/2018   H/O autologous stem cell transplant ([place])    may 2014  at [place]   History of Bell's palsy    2009  RIGHT SIDE--  HAS 80% FUNCTION / PT STATES A LITTLE ASYMETRICAL AND EFFECTS MOUTH   History of radiation therapy 10/18/17-11/05/17   sprine T1 26 Gy in 13 fractions, spine boost 10 Gy in 5 fractions   History of radiation therapy    Right hip, Rt Sclav- 07/23/22-08/07/11- Dr. Gery Pray   Hodgkin's disease, nodular sclerosis, of inguinal region/lower limb ([place]) ONOLOGIST--  DR Marin Olp AND A [facility]     SALVAGE CHEMO 2013/  AUTOLOGUS STEM CELL TRANSPLANT MAY 2014 AT [facility]   Hypertension    Mass of right chest wall 09/18/2018   Mass of right inguinal region    Neuromuscular disorder ([place])    bilateral feet neuropathy   PVC (premature ventricular contraction)    "benign"   TIA (transient ischemic attack) 02/2015   "probable TIA"   Wears contact lenses    Past Surgical History:  Procedure Laterality Date   AXILLARY LYMPH NODE BIOPSY Left 02/02/2013   Procedure: NEEDLE LOCALIZED AXILLARY LYMPH NODE BIOPSY;  Surgeon: Haywood Lasso, MD;  Location: [place];  Service: General;  Laterality: Left;   BACK SURGERY  03/2021   BONE MARROW BIOPSY  08/26/2012   COLONOSCOPY  2021    CYST REMOVAL NECK Left 11/26/1978   LEFT INGUINAL LYMPH NODE BX  09/09/2012   LYMPH NODE BIOPSY N/A 07/28/2015   Procedure: LYMPH NODE BIOPSY;  Surgeon: Melrose Nakayama, MD;  Location: [place];  Service: Thoracic;  Laterality: N/A;   LYMPH NODE BIOPSY Right 06/29/2022   Procedure: RIGHT AXILLARY NODE BIOPSY;  Surgeon: Melrose Nakayama, MD;  Location: [place];  Service: Vascular;  Laterality: Right;   MASS EXCISION Right 09/19/2018   Procedure: EXCISION OF RIGHT CHEST WALL MASS ERAS PATHWAY;  Surgeon: Fanny Skates, MD;  Location: [place];  Service: General;  Laterality: Right;   NODE DISSECTION Right 07/28/2015   Procedure: NODE DISSECTION;  Surgeon: Melrose Nakayama, MD;  Location: [place] [place];  Service: Thoracic;  Laterality: Right;   PLEURA BIOPSY Left 05/31/2014   REMOVAL RIGHT INGUINAL LYMPH NODES  08/16/2011   SCROTAL EXPLORATION Right 01/04/2014   Procedure: SCROTUM EXPLORATION   INGUINAL , EXCISION OF CYSTIC MASS OF RIGHT SPERMATIC CORD, [place];  Surgeon: Franchot Gallo, MD;  Location: [facility];  Service: Urology;  Laterality: Right;   TRANSTHORACIC ECHOCARDIOGRAM  03/18/2013   MILD LVH/  EF 55-60%   VIDEO ASSISTED THORACOSCOPY Left  05/31/2014   Procedure: LEFT VIDEO ASSISTED THORACOSCOPY, PLEURAL BIOPSY;  Surgeon: Melrose Nakayama, MD;  Location: [facility];  Service: Thoracic;  Laterality: Left;   VIDEO ASSISTED THORACOSCOPY Right 07/28/2015   Procedure: RIGHT VIDEO ASSISTED THORACOSCOPY;  Surgeon: Melrose Nakayama, MD;  Location: [place];  Service: Thoracic;  Laterality: Right;   VIDEO ASSISTED THORACOSCOPY (VATS)/ LYMPH NODE SAMPLING Right 07/28/2015   VIDEO ASSISTED THORACOSCOPY (VATS)/WEDGE RESECTION  05/31/2014   VIDEO BRONCHOSCOPY WITH ENDOBRONCHIAL ULTRASOUND  07/28/2015   VIDEO BRONCHOSCOPY WITH ENDOBRONCHIAL ULTRASOUND N/A 07/28/2015   Procedure: VIDEO BRONCHOSCOPY WITH ENDOBRONCHIAL ULTRASOUND;  Surgeon: Melrose Nakayama, MD;  Location: [place];  Service: Thoracic;  Laterality: N/A;   Patient Active Problem List   Diagnosis Date Noted   Primary hypertension 09/12/2022   Closed fracture of right proximal humerus 10/03/2021   Traumatic complete tear of right rotator cuff 10/03/2021   Lumbar radiculopathy 10/03/2021   Goals of care, counseling/discussion 09/25/2018   Mass of right chest wall 09/18/2018   PD (Parkinson's disease) 11/27/2016   Lymphadenopathy 07/28/2015   Mediastinal adenopathy 07/14/2015   Aphasia 03/04/2015   Slurred speech 03/04/2015   Pleural mass 05/31/2014   Abdominal pain, epigastric 02/02/2014   Weight loss 08/12/2012   H/O Bell's palsy 08/12/2012   Hodgkin's disease 08/09/2011    ONSET DATE: MD referral date 09/27/2022 (as 32-monthfollow up from discharge)  REFERRING DIAG: G20.B2 (ICD-10-CM) - Parkinson's disease with dyskinesia and fluctuating manifestations   THERAPY DIAG:  Other abnormalities of gait and mobility  Unsteadiness on feet  Rationale for Evaluation and Treatment: Rehabilitation  SUBJECTIVE:                                                                                                                                                                                             SUBJECTIVE STATEMENT: Hodgkins came back this summer-had surgery after scan in June.  Had radiation pinpointed for that.  Every since then, I'm sore all through hips and back.  Had had to get Tramadol.  Everyday is different and varied through the day, but overall feel Parkinson's is under control.  Reports having freezing episodes at times.   Pt accompanied by: self  PERTINENT HISTORY: See above  PAIN:  Are you having pain? Yes: NPRS scale: up to 10/10 Pain location: hips, shoulder, back Pain description: sore all over Aggravating factors: all movements, getting up from sitting Relieving factors: tylenol, stretching (somewhat)  PRECAUTIONS: Fall and Other: hx of  Hodgkins/recent radiation treatments   WEIGHT BEARING RESTRICTIONS: No  FALLS: Has patient fallen in last 6 months? No  LIVING ENVIRONMENT:  Lives with: lives with their family Lives in: House/apartment Stairs: 2 Has following equipment at home: Single point cane, Environmental consultant - 2 wheeled, and Wheelchair (manual)  PLOF: Independent and planning to retire   Going to [place]: Want the pain to stop  OBJECTIVE:   DIAGNOSTIC FINDINGS: Awaiting PET scan (11/16), MRI for low back (12/7)   COGNITION: Overall cognitive status: Within functional limits for tasks assessed   SENSATION: Reports numbness in legs in the mornings   POSTURE: rounded shoulders, forward head, posterior pelvic tilt, and dyskinesias at time  LOWER EXTREMITY ROM:   WFL in sitting  Active  Right Eval Left Eval  Hip flexion    Hip extension    Hip abduction    Hip adduction    Hip internal rotation    Hip external rotation    Knee flexion    Knee extension    Ankle dorsiflexion    Ankle plantarflexion    Ankle inversion    Ankle eversion     (Blank rows = not tested)  LOWER EXTREMITY MMT:    MMT Right Eval Left Eval  Hip flexion 4+ 4+  Hip extension    Hip abduction 4+ 4+  Hip adduction 4+ 4+  Hip internal rotation    Hip external rotation    Knee flexion 4+ 4  Knee extension 4+ 4  Ankle dorsiflexion 4+ 4  Ankle plantarflexion    Ankle inversion    Ankle eversion    (Blank rows = not tested)   TRANSFERS: Assistive device utilized: None  Sit to stand: Modified independence Stand to sit: Modified independence GAIT: Gait pattern: step through pattern, occasional dyskinesias Distance walked: 40 ft x 4 Assistive device utilized: None Level of assistance: Complete Independence   FUNCTIONAL TESTS:  5 times sit to stand: 10.28 sec Timed up and go (TUG): 8.28 sec TUG cognitive:  9.9 MiniBESTest: 20/28 (Scores >22/28 indicate increased fall risk)    TODAY'S  TREATMENT:                                                                                                                              DATE: 10/10/2022    PATIENT EDUCATION: Education details: Eval results Person educated: Patient Education method: Explanation Education comprehension: verbalized understanding  HOME EXERCISE PROGRAM: Not initiated  GOALS: Goals reviewed with patient? Yes  SHORT TERM GOALS: = LTGs   LONG TERM GOALS: Target date: 11/09/2022  Pt will be independent with HEP for improved balance. Baseline:  Goal status: INITIAL  2.  Pt will improve MiniBESTest score to at least 23/28 to decrease fall risk. Baseline: 20/28 Goal status: INITIAL  3.  Pt will perform posterior push and release in posterior direction in 2 or less steps, for improved balance recovery. Baseline: multiple small steps and would fall if unaided Goal status: INITIAL   ASSESSMENT:  CLINICAL IMPRESSION: Patient is a 60 y.o. male who was seen today  for physical therapy evaluation and treatment for return PT eval for Parkinson's disease.   In the time since PT d/c in May 2023, he has had return of Hodgkin's disease, and had radiation treatments.  He has also had significant back pain; he will be following up with PET scan and MRI in the next several weeks.  Overall, his FTSTS and TUG measures are better than at his previous bout of PT.  However, with MiniBESTest, pt demonstrates increased fall risk with score of 20/28, particularly noted to have multiple steps to regain balance in ant/post/lateral directions.  In posterior direction, he takes multiple small steps and would fall if unaided.  He is in agreement, that despite upcoming tests for other medical issues, to address specifically balance strategies in therapy at this time.  OBJECTIVE IMPAIRMENTS: Abnormal gait, decreased balance, difficulty walking, and postural dysfunction.   ACTIVITY LIMITATIONS: locomotion level  PARTICIPATION  LIMITATIONS: community activity  PERSONAL FACTORS: 3+ comorbidities: See PMH above  are also affecting patient's functional outcome.   REHAB POTENTIAL: Good  CLINICAL DECISION MAKING: Evolving/moderate complexity  EVALUATION COMPLEXITY: Moderate  PLAN:  PT FREQUENCY: 2x/week  PT DURATION: 4 weeks  PLANNED INTERVENTIONS: Therapeutic exercises, Therapeutic activity, Neuromuscular re-education, Balance training, Gait training, Patient/Family education, and Self Care  PLAN FOR NEXT SESSION: Initiate step strategies for balance; work on limits of stability, hip and ankle strategies as well.  Multi-direction stepping   Caroleen Stoermer W., PT 10/10/2022, 5:37 PM  [facility] at [facility], [place], [place] [address] Phone # [phone] Fax # [phone]

## 2022-10-11 ENCOUNTER — Encounter (HOSPITAL_COMMUNITY)
Admission: RE | Admit: 2022-10-11 | Discharge: 2022-10-11 | Disposition: A | Discharge status: Home / Self Care | Payer: Commercial Managed Care - PPO | Source: Ambulatory Visit | Attending: Hematology & Oncology | Admitting: Hematology & Oncology

## 2022-10-11 DIAGNOSIS — C8195 Hodgkin lymphoma, unspecified, lymph nodes of inguinal region and lower limb: Secondary | ICD-10-CM | POA: Insufficient documentation

## 2022-10-11 LAB — GLUCOSE, CAPILLARY: Glucose-Capillary: 76 mg/dL (ref 70–99)

## 2022-10-11 MED ORDER — FLUDEOXYGLUCOSE F - 18 (FDG) INJECTION
9.1000 | Freq: Once | INTRAVENOUS | Status: AC
  Administered 2022-10-11: 8.73 via INTRAVENOUS

## 2022-10-16 ENCOUNTER — Inpatient Hospital Stay (HOSPITAL_BASED_OUTPATIENT_CLINIC_OR_DEPARTMENT_OTHER): Payer: Commercial Managed Care - PPO | Admitting: Hematology & Oncology

## 2022-10-16 ENCOUNTER — Encounter: Payer: Self-pay | Admitting: Hematology & Oncology

## 2022-10-16 ENCOUNTER — Other Ambulatory Visit: Payer: Self-pay

## 2022-10-16 ENCOUNTER — Inpatient Hospital Stay: Payer: Commercial Managed Care - PPO | Attending: Hematology & Oncology

## 2022-10-16 VITALS — BP 107/61 | HR 86 | Temp 98.5°F | Resp 18 | Ht 70.0 in | Wt 177.0 lb

## 2022-10-16 DIAGNOSIS — C8195 Hodgkin lymphoma, unspecified, lymph nodes of inguinal region and lower limb: Secondary | ICD-10-CM | POA: Diagnosis not present

## 2022-10-16 DIAGNOSIS — G20A1 Parkinson's disease without dyskinesia, without mention of fluctuations: Secondary | ICD-10-CM | POA: Diagnosis not present

## 2022-10-16 DIAGNOSIS — Z8673 Personal history of transient ischemic attack (TIA), and cerebral infarction without residual deficits: Secondary | ICD-10-CM | POA: Insufficient documentation

## 2022-10-16 DIAGNOSIS — Z79899 Other long term (current) drug therapy: Secondary | ICD-10-CM | POA: Insufficient documentation

## 2022-10-16 DIAGNOSIS — Z9484 Stem cells transplant status: Secondary | ICD-10-CM | POA: Diagnosis not present

## 2022-10-16 DIAGNOSIS — Z923 Personal history of irradiation: Secondary | ICD-10-CM | POA: Diagnosis not present

## 2022-10-16 DIAGNOSIS — C8198 Hodgkin lymphoma, unspecified, lymph nodes of multiple sites: Secondary | ICD-10-CM | POA: Insufficient documentation

## 2022-10-16 DIAGNOSIS — Z5111 Encounter for antineoplastic chemotherapy: Secondary | ICD-10-CM | POA: Diagnosis present

## 2022-10-16 LAB — CMP (CANCER CENTER ONLY)
ALT: 5 U/L (ref 0–44)
AST: 18 U/L (ref 15–41)
Albumin: 4.6 g/dL (ref 3.5–5.0)
Alkaline Phosphatase: 83 U/L (ref 38–126)
Anion gap: 7 (ref 5–15)
BUN: 49 mg/dL — ABNORMAL HIGH (ref 8–23)
CO2: 27 mmol/L (ref 22–32)
Calcium: 9.5 mg/dL (ref 8.9–10.3)
Chloride: 106 mmol/L (ref 98–111)
Creatinine: 1.7 mg/dL — ABNORMAL HIGH (ref 0.61–1.24)
GFR, Estimated: 45 mL/min — ABNORMAL LOW (ref >60)
Glucose, Bld: 97 mg/dL (ref 70–99)
Potassium: 5.1 mmol/L (ref 3.5–5.1)
Sodium: 140 mmol/L (ref 135–145)
Total Bilirubin: 0.6 mg/dL (ref 0.3–1.2)
Total Protein: 6.9 g/dL (ref 6.5–8.1)

## 2022-10-16 LAB — CBC WITH DIFFERENTIAL (CANCER CENTER ONLY)
Abs Immature Granulocytes: 0.02 10*3/uL (ref 0.00–0.07)
Basophils Absolute: 0.1 10*3/uL (ref 0.0–0.1)
Basophils Relative: 1 %
Eosinophils Absolute: 0.2 10*3/uL (ref 0.0–0.5)
Eosinophils Relative: 3 %
HCT: 40.4 % (ref 39.0–52.0)
Hemoglobin: 13 g/dL (ref 13.0–17.0)
Immature Granulocytes: 0 %
Lymphocytes Relative: 12 %
Lymphs Abs: 0.8 10*3/uL (ref 0.7–4.0)
MCH: 32.4 pg (ref 26.0–34.0)
MCHC: 32.2 g/dL (ref 30.0–36.0)
MCV: 100.7 fL — ABNORMAL HIGH (ref 80.0–100.0)
Monocytes Absolute: 0.8 10*3/uL (ref 0.1–1.0)
Monocytes Relative: 12 %
Neutro Abs: 5 10*3/uL (ref 1.7–7.7)
Neutrophils Relative %: 72 %
Platelet Count: 239 10*3/uL (ref 150–400)
RBC: 4.01 MIL/uL — ABNORMAL LOW (ref 4.22–5.81)
RDW: 12.9 % (ref 11.5–15.5)
WBC Count: 6.8 10*3/uL (ref 4.0–10.5)
nRBC: 0 % (ref 0.0–0.2)

## 2022-10-16 LAB — TSH: TSH: 5.07 (ref 0.41–5.90)

## 2022-10-16 LAB — LACTATE DEHYDROGENASE: LDH: 188 U/L (ref 98–192)

## 2022-10-16 LAB — PSA: PSA: 2.7

## 2022-10-16 NOTE — Progress Notes (Signed)
START OFF PATHWAY REGIMEN - Lymphoma and CLL   [id number] (Doxorubicin IV + Vinblastine IV + Dacarbazine IV) q28 Days:   A cycle is every 28 days:     Dacarbazine      Doxorubicin      Vinblastine   **Always confirm dose/schedule in your pharmacy ordering system**  Patient Characteristics: Classic Hodgkin Lymphoma, First Line, Stage III / IV, Age ? 59 Disease Type: Not Applicable Disease Type: Not Applicable Disease Type: Classic Hodgkin Lymphoma Line of therapy: First Line Age: ? 7 Intent of Therapy: Non-Curative / Palliative Intent, Discussed with Patient

## 2022-10-16 NOTE — Progress Notes (Signed)
Hematology and Oncology Follow Up Visit  Brian [name] [id number] 02-25-1961 61 y.o. 10/16/2022   Principle Diagnosis:  Recurrent Hodgkin's Disease -  S/p CAR-T therapy -- relapsed TIA-resolved  Current Therapy:  ANVD -- start cycle #1 on 10/25/2022 Nivolumab q 3 month -- s/p cycle #18 -- on hold XRT -- Involved field --completed on 08/06/2022        Interim History:  [name] is in for follow-up.  Unfortunately, we have another recurrence.  We did do a PET scan on him.  This was done on 10/11/2022.  Where he had the radiation to his right axilla, this all looked fine.  However, there is appear to be activity in the mediastinum.  There was several lymph nodes in the mediastinum that were new and had increased SUV.  I think this is clearly the "modus operandi" for his cancer.  This Hodgkin's just keeps coming back.  He feels good.  He looks good.  He has had no chest pain.  He has had no cough or shortness of breath.  He tolerated radiation pretty well.  He does have the back issues.  He has been seen at [facility].  I think he supposed to have an epidural injection for his lower back.  He has Parkinson's disease.  This might be a little bit worse.  He is on his Parkinson's medication.  He is still try to exercise.  His girlfriend, who is incredibly supportive, really has been a benefit for him.  At this point, I think we will get have to think about systemic therapy.  He has not had systemic therapy now probably for good 7 or 8 years.  I really think that would be a great option for him would be utilizing chemotherapy along with immunotherapy.  I think that utilizing the ABVD regimen but substituting nivolumab in for bleomycin would be a great way to treat him.  I realize that he has had Adriamycin in the past.  This probably has been close to 10 years.  I think he is probably going to need Zinecard as a cardioprotective agent for the Adriamycin.  I really believe this would be a  very logical way to try to treat him.  He will need to have a Port-A-Cath placed.  Currently, I would have said that his performance status is probably ECOG 1.    Medications:  Current Outpatient Medications:    acetaminophen (TYLENOL) 500 MG tablet, Take 1,000 mg by mouth every 8 (eight) hours as needed for moderate pain., Disp: , Rfl:    carbidopa-levodopa (SINEMET CR) 50-200 MG tablet, TAKE 1 TABLET BY MOUTH AT BEDTIME, Disp: 90 tablet, Rfl: 0   carbidopa-levodopa (SINEMET IR) 25-100 MG tablet, TAKE 2 TABLETS AT 7AM, 11AM, 3PM, AND AT 7PM DAILY., Disp: 720 tablet, Rfl: 0   cholecalciferol (VITAMIN D3) 25 MCG (1000 UNIT) tablet, Take 1,000 Units by mouth daily., Disp: , Rfl:    ibuprofen (ADVIL) 200 MG tablet, Take 200 mg by mouth every 6 (six) hours as needed for moderate pain., Disp: , Rfl:    olmesartan (BENICAR) 20 MG tablet, Take 0.5 tablets (10 mg total) by mouth daily., Disp: 45 tablet, Rfl: 3   pramipexole (MIRAPEX) 0.5 MG tablet, TAKE 1 TABLET(0.5 MG) BY MOUTH THREE TIMES DAILY, Disp: 270 tablet, Rfl: 1   traMADol (ULTRAM) 50 MG tablet, Take 1 tablet (50 mg total) by mouth every 8 (eight) hours as needed., Disp: 60 tablet, Rfl: 0  Allergies: No  Known Allergies  Past Medical History, Surgical history, Social history, and Family History were reviewed and updated.  Review of Systems: Review of Systems  Neurological:  Positive for tremors.  All other systems reviewed and are negative.    Physical Exam:  height is '5\' 10"'$  (1.778 m) and weight is 177 lb (80.3 kg). His oral temperature is 98.5 F (36.9 C). His blood pressure is 107/61 and his pulse is 86. His respiration is 18 and oxygen saturation is 96%.   Physical Exam Vitals reviewed.  HENT:     Head: Normocephalic and atraumatic.  Eyes:     Pupils: Pupils are equal, round, and reactive to light.  Cardiovascular:     Rate and Rhythm: Normal rate and regular rhythm.     Heart sounds: Normal heart sounds.  Pulmonary:      Effort: Pulmonary effort is normal.     Breath sounds: Normal breath sounds.  Abdominal:     General: Bowel sounds are normal.     Palpations: Abdomen is soft.  Musculoskeletal:        General: No tenderness or deformity. Normal range of motion.     Cervical back: Normal range of motion.  Lymphadenopathy:     Cervical: No cervical adenopathy.  Skin:    General: Skin is warm and dry.     Findings: No erythema or rash.  Neurological:     Mental Status: He is alert and oriented to person, place, and time.  Psychiatric:        Behavior: Behavior normal.        Thought Content: Thought content normal.        Judgment: Judgment normal.     Lab Results  Component Value Date   WBC 6.8 10/16/2022   HGB 13.0 10/16/2022   HCT 40.4 10/16/2022   MCV 100.7 (H) 10/16/2022   PLT 239 10/16/2022     Chemistry      Component Value Date/Time   NA 140 10/16/2022 0953   NA 142 04/02/2017 0000   NA 140 07/19/2016 1305   K 5.1 10/16/2022 0953   K 4.4 07/19/2016 1305   CL 106 10/16/2022 0953   CL 105 02/08/2016 1117   CL 107 03/25/2013 0828   CO2 27 10/16/2022 0953   CO2 26 07/19/2016 1305   BUN 49 (H) 10/16/2022 0953   BUN 21 04/02/2017 0000   BUN 22.8 07/19/2016 1305   CREATININE 1.70 (H) 10/16/2022 0953   CREATININE 1.1 07/19/2016 1305   GLU 105 04/02/2017 0000      Component Value Date/Time   CALCIUM 9.5 10/16/2022 0953   CALCIUM 9.8 07/19/2016 1305   ALKPHOS 83 10/16/2022 0953   ALKPHOS 76 07/19/2016 1305   AST 18 10/16/2022 0953   AST 18 07/19/2016 1305   ALT 5 10/16/2022 0953   ALT 14 07/19/2016 1305   BILITOT 0.6 10/16/2022 0953   BILITOT 0.70 07/19/2016 1305      Impression and Plan: Brian [name] is a 61 year old gentleman.  He has history of recurrent Hodgkin's disease. 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 He then had a recurrence after this.  He did undergo CAR-T therapy at [facility].  I believe he had this in April 2017.  He did well  with this.  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  This was proven by biopsy.  He had radiation therapy for this.  He subsequently has had another recurrence.  This  was in the right axilla.  This was biopsy-proven.  He did undergo radiation therapy for this.  This was completed in September/2023.  Now, we have another recurrence.  I really should not be surprised by this.  We can have to treat him systemically.  Again I really think that the  ANVD protocol would be a very good way of treating this.  I still think that he would have sensitivity to chemotherapy.  He really wants he is to start of treatment soon.  We will have to get the echocardiogram on him.  We will have to get the Port-A-Cath placed.  Hopefully we get everything done in a week.  I will give him the 2 cycles of treatment and then repeat his PET scan.  How much of chemotherapy to give him is somewhat debatable.  However, we have to remember that he has had Adriamycin in the past.  Ultimately, I would like to think we could just get him on Opdivo as a maintenance.  We will try to get started next week.  I will then plan to see him back when he has day 15 of cycle 1 of therapy.       [name], MD 11/21/20235:10 PM

## 2022-10-17 ENCOUNTER — Encounter: Payer: Self-pay | Admitting: Hematology & Oncology

## 2022-10-17 ENCOUNTER — Ambulatory Visit: Payer: Commercial Managed Care - PPO | Admitting: Physical Therapy

## 2022-10-17 ENCOUNTER — Encounter: Payer: Self-pay | Admitting: *Deleted

## 2022-10-17 ENCOUNTER — Encounter: Payer: Self-pay | Admitting: Physical Therapy

## 2022-10-17 ENCOUNTER — Other Ambulatory Visit: Payer: Self-pay | Admitting: Radiology

## 2022-10-17 DIAGNOSIS — R2681 Unsteadiness on feet: Secondary | ICD-10-CM

## 2022-10-17 DIAGNOSIS — R2689 Other abnormalities of gait and mobility: Secondary | ICD-10-CM

## 2022-10-17 LAB — LIPID PANEL
Cholesterol: 173 (ref 0–200)
HDL: 42 (ref 35–70)
LDL Cholesterol: 114
Triglycerides: 92 (ref 40–160)

## 2022-10-17 NOTE — Therapy (Signed)
OUTPATIENT PHYSICAL THERAPY NEURO TREATMENT NOTE   Patient Name: Brian [name] MRN: [id number] DOB:1961/10/19, 61 y.o., male Today's Date: 10/17/2022   PCP: [name]. Copland, MD REFERRING PROVIDER: Alonza Bogus, DO   PT End of Session - 10/17/22 1147     Visit Number 2    Number of Visits 8    Date for PT Re-Evaluation 11/09/22    Authorization Type UMR    PT Start Time 1149    PT Stop Time 1228    PT Time Calculation (min) 39 min    Activity Tolerance Patient tolerated treatment well    Behavior During Therapy WFL for tasks assessed/performed             Past Medical History:  Diagnosis Date   Anxiety    Dysrhythmia    past hx pvc   Goals of care, counseling/discussion 09/25/2018   H/O autologous stem cell transplant ([place])    may 2014  at [place]   History of Bell's palsy    2009  RIGHT SIDE--  HAS 80% FUNCTION / PT STATES A LITTLE ASYMETRICAL AND EFFECTS MOUTH   History of radiation therapy 10/18/17-11/05/17   sprine T1 26 Gy in 13 fractions, spine boost 10 Gy in 5 fractions   History of radiation therapy    Right hip, Rt Sclav- 07/23/22-08/07/11- Dr. Gery Pray   Hodgkin's disease, nodular sclerosis, of inguinal region/lower limb ([place]) ONOLOGIST--  DR Marin Olp AND A [facility]     SALVAGE CHEMO 2013/  AUTOLOGUS STEM CELL TRANSPLANT MAY 2014 AT [facility]   Hypertension    Mass of right chest wall 09/18/2018   Mass of right inguinal region    Neuromuscular disorder ([place])    bilateral feet neuropathy   PVC (premature ventricular contraction)    "benign"   TIA (transient ischemic attack) 02/2015   "probable TIA"   Wears contact lenses    Past Surgical History:  Procedure Laterality Date   AXILLARY LYMPH NODE BIOPSY Left 02/02/2013   Procedure: NEEDLE LOCALIZED AXILLARY LYMPH NODE BIOPSY;  Surgeon: Haywood Lasso, MD;  Location: [place];  Service: General;  Laterality: Left;   BACK SURGERY  03/2021   BONE MARROW BIOPSY  08/26/2012   COLONOSCOPY   2021   CYST REMOVAL NECK Left 11/26/1978   LEFT INGUINAL LYMPH NODE BX  09/09/2012   LYMPH NODE BIOPSY N/A 07/28/2015   Procedure: LYMPH NODE BIOPSY;  Surgeon: Melrose Nakayama, MD;  Location: [place];  Service: Thoracic;  Laterality: N/A;   LYMPH NODE BIOPSY Right 06/29/2022   Procedure: RIGHT AXILLARY NODE BIOPSY;  Surgeon: Melrose Nakayama, MD;  Location: [place];  Service: Vascular;  Laterality: Right;   MASS EXCISION Right 09/19/2018   Procedure: EXCISION OF RIGHT CHEST WALL MASS ERAS PATHWAY;  Surgeon: Fanny Skates, MD;  Location: [place];  Service: General;  Laterality: Right;   NODE DISSECTION Right 07/28/2015   Procedure: NODE DISSECTION;  Surgeon: Melrose Nakayama, MD;  Location: [place];  Service: Thoracic;  Laterality: Right;   PLEURA BIOPSY Left 05/31/2014   REMOVAL RIGHT INGUINAL LYMPH NODES  08/16/2011   SCROTAL EXPLORATION Right 01/04/2014   Procedure: SCROTUM EXPLORATION   INGUINAL , EXCISION OF CYSTIC MASS OF RIGHT SPERMATIC CORD, [place];  Surgeon: Franchot Gallo, MD;  Location[facility];  Service: Urology;  Laterality: Right;   TRANSTHORACIC ECHOCARDIOGRAM  03/18/2013   MILD LVH/  EF 55-60%   VIDEO ASSISTED THORACOSCOPY  Left 05/31/2014   Procedure: LEFT VIDEO ASSISTED THORACOSCOPY, PLEURAL BIOPSY;  Surgeon: Melrose Nakayama, MD;  Location: [place];  Service: Thoracic;  Laterality: Left;   VIDEO ASSISTED THORACOSCOPY Right 07/28/2015   Procedure: RIGHT VIDEO ASSISTED THORACOSCOPY;  Surgeon: Melrose Nakayama, MD;  Location: [place];  Service: Thoracic;  Laterality: Right;   VIDEO ASSISTED THORACOSCOPY (VATS)/ LYMPH NODE SAMPLING Right 07/28/2015   VIDEO ASSISTED THORACOSCOPY (VATS)/WEDGE RESECTION  05/31/2014   VIDEO BRONCHOSCOPY WITH ENDOBRONCHIAL ULTRASOUND  07/28/2015   VIDEO BRONCHOSCOPY WITH ENDOBRONCHIAL ULTRASOUND N/A 07/28/2015   Procedure: VIDEO BRONCHOSCOPY WITH ENDOBRONCHIAL ULTRASOUND;  Surgeon:  Melrose Nakayama, MD;  Location: [place];  Service: Thoracic;  Laterality: N/A;   Patient Active Problem List   Diagnosis Date Noted   Primary hypertension 09/12/2022   Closed fracture of right proximal humerus 10/03/2021   Traumatic complete tear of right rotator cuff 10/03/2021   Lumbar radiculopathy 10/03/2021   Goals of care, counseling/discussion 09/25/2018   Mass of right chest wall 09/18/2018   PD (Parkinson's disease) 11/27/2016   Lymphadenopathy 07/28/2015   Mediastinal adenopathy 07/14/2015   Aphasia 03/04/2015   Slurred speech 03/04/2015   Pleural mass 05/31/2014   Abdominal pain, epigastric 02/02/2014   Weight loss 08/12/2012   H/O Bell's palsy 08/12/2012   Hodgkin's disease 08/09/2011    ONSET DATE: MD referral date 09/27/2022 (as 26-monthfollow up from discharge)  REFERRING DIAG: G20.B2 (ICD-10-CM) - Parkinson's disease with dyskinesia and fluctuating manifestations   THERAPY DIAG:  Other abnormalities of gait and mobility  Unsteadiness on feet  Rationale for Evaluation and Treatment: Rehabilitation  SUBJECTIVE:                                                                                                                                                                                             SUBJECTIVE STATEMENT: Have to go back on treatment for the lymphoma.  Will have to start the treatment next week.  Fell walking the dog today on the sidewalk, didn't hurt anything Pt accompanied by: self  PERTINENT HISTORY: See above  PAIN:  Are you having pain? Yes: NPRS scale: 6-7/10 Pain location: hips, shoulder, back Pain description: sore all over Aggravating factors: all movements, getting up from sitting Relieving factors: tylenol, stretching (somewhat)  PRECAUTIONS: Fall and Other: hx of Hodgkins/recent radiation treatments   WEIGHT BEARING RESTRICTIONS: No  FALLS: Has patient fallen in last 6 months? No  LIVING ENVIRONMENT: Lives with: lives with  their family Lives in: House/apartment Stairs: 2 Has following equipment at home: Single point cane, WEnvironmental consultant- 2 wheeled, and Wheelchair (manual)  PLOF: Independent  and planning to retire   Going to [place]: Want the pain to stop  OBJECTIVE:    TODAY'S TREATMENT: 10/17/2022 Activity Comments  Heel/toe raises x 10 reps Cues for technique  Stagger stance forward/back weightshifting Cues for technique  Hip strategy work at wall x 10   Step strategy work: Forward and side step over obstacle Back step and weight shift  Cues for deliberate motion-start/stop, cues for wider BOS  Multi-direction stepping for varied directions of balance challenge Cues for technique  Forward/back step and weightshift x 10 reps   Monster walk forward/back 3 reps   Resisted forward gait/then backward gait, 5 reps with min assist; then with weight removed forward/back gait with cues for BIG, deliberate effort 15# at weight rack   Balance activities above to work on ankle, hip, step strategy work for balance.  Cues throughout for deliberate motions.  Access Code: [email or web address] URL: [email or web address] Date: 10/17/2022 Prepared by: [facility]  Exercises - Alternating Step forward-Balance Reaction  - 1 x daily - 5 x weekly - 1-2 sets - 10 reps - Alternating Step Backward with Support  - 1 x daily - 5 x weekly - 1-2 sets - 10 reps - Step Sideways  - 1 x daily - 5 x weekly - 1-2 sets - 10 reps -------------------------------------------------------------------------------------- Objective Measures below are from PT evaluation:  DIAGNOSTIC FINDINGS: Awaiting PET scan (11/16), MRI for low back (12/7)   COGNITION: Overall cognitive status: Within functional limits for tasks assessed   SENSATION: Reports numbness in legs in the mornings   POSTURE: rounded shoulders, forward head, posterior pelvic tilt, and dyskinesias at time  LOWER  EXTREMITY ROM:   WFL in sitting  Active  Right Eval Left Eval  Hip flexion    Hip extension    Hip abduction    Hip adduction    Hip internal rotation    Hip external rotation    Knee flexion    Knee extension    Ankle dorsiflexion    Ankle plantarflexion    Ankle inversion    Ankle eversion     (Blank rows = not tested)  LOWER EXTREMITY MMT:    MMT Right Eval Left Eval  Hip flexion 4+ 4+  Hip extension    Hip abduction 4+ 4+  Hip adduction 4+ 4+  Hip internal rotation    Hip external rotation    Knee flexion 4+ 4  Knee extension 4+ 4  Ankle dorsiflexion 4+ 4  Ankle plantarflexion    Ankle inversion    Ankle eversion    (Blank rows = not tested)   TRANSFERS: Assistive device utilized: None  Sit to stand: Modified independence Stand to sit: Modified independence GAIT: Gait pattern: step through pattern, occasional dyskinesias Distance walked: 40 ft x 4 Assistive device utilized: None Level of assistance: Complete Independence   FUNCTIONAL TESTS:  5 times sit to stand: 10.28 sec Timed up and go (TUG): 8.28 sec TUG cognitive:  9.9 MiniBESTest: 20/28 (Scores >22/28 indicate increased fall risk)    TODAY'S TREATMENT:  DATE: 10/10/2022    PATIENT EDUCATION: Education details: Eval results Person educated: Patient Education method: Explanation Education comprehension: verbalized understanding  HOME EXERCISE PROGRAM: Not initiated  GOALS: Goals reviewed with patient? Yes  SHORT TERM GOALS: = LTGs   LONG TERM GOALS: Target date: 11/09/2022  Pt will be independent with HEP for improved balance. Baseline:  Goal status: INITIAL  2.  Pt will improve MiniBESTest score to at least 23/28 to decrease fall risk. Baseline: 20/28 Goal status: INITIAL  3.  Pt will perform posterior push and release in posterior direction in 2 or  less steps, for improved balance recovery. Baseline: multiple small steps and would fall if unaided Goal status: INITIAL   ASSESSMENT:  CLINICAL IMPRESSION: Skilled PT session focused on balance strategies for balance recovery.  Particularly focused on step strategies with deliberate effort for 1-2 steps in each direction for balance recovery.  Pt does have tendency for narrowed BOS and multiple steps, but he does respond well to use of resisted gait with weight rack and then weight removed.  He is to begin treatment next week for return of non-Hodgkins lymphoma and will have port placed.  He will continue to benefit from skilled PT towards goals for improved balance and steadiness.  OBJECTIVE IMPAIRMENTS: Abnormal gait, decreased balance, difficulty walking, and postural dysfunction.   ACTIVITY LIMITATIONS: locomotion level  PARTICIPATION LIMITATIONS: community activity  PERSONAL FACTORS: 3+ comorbidities: See PMH above  are also affecting patient's functional outcome.   REHAB POTENTIAL: Good  CLINICAL DECISION MAKING: Evolving/moderate complexity  EVALUATION COMPLEXITY: Moderate  PLAN:  PT FREQUENCY: 2x/week  PT DURATION: 4 weeks  PLANNED INTERVENTIONS: Therapeutic exercises, Therapeutic activity, Neuromuscular re-education, Balance training, Gait training, Patient/Family education, and Self Care  PLAN FOR NEXT SESSION: Review step strategies for balance; work on limits of stability, hip and ankle strategies as well.  Multi-direction stepping and resisted gait   Esma Kilts W., PT 10/17/2022, 12:45 PM  [facility] at [facility], [address] [place], [place] [address] Phone # [phone] Fax # [phone]

## 2022-10-17 NOTE — Addendum Note (Signed)
Addended by: Burney Gauze R on: 10/17/2022 07:13 AM   Modules accepted: Orders

## 2022-10-17 NOTE — Progress Notes (Signed)
Patient will need to begin treatment for his Hodgkin's recurrence. He will need a port and echo prior to start.  Both appointments made. Patient notified of appointment dates, time and locations via [facility].   Oncology Nurse Navigator Documentation     10/17/2022    9:00 AM  Oncology Nurse Navigator Flowsheets  Navigator Location [facility]  Navigator Encounter Type Appt/Treatment Plan Review  Barriers/Navigation Needs Coordination of Care  Interventions Coordination of Care;Education  Acuity Level 1-No Barriers  Coordination of Care Appts;Radiology;Other  Education Method Written  Time Spent with Patient 30

## 2022-10-18 ENCOUNTER — Encounter: Payer: Self-pay | Admitting: Family Medicine

## 2022-10-22 ENCOUNTER — Ambulatory Visit (HOSPITAL_COMMUNITY)
Admission: RE | Admit: 2022-10-22 | Discharge: 2022-10-22 | Disposition: A | Discharge status: Home / Self Care | Payer: Commercial Managed Care - PPO | Source: Ambulatory Visit | Attending: Hematology & Oncology | Admitting: Hematology & Oncology

## 2022-10-22 ENCOUNTER — Other Ambulatory Visit: Payer: Self-pay | Admitting: Student

## 2022-10-22 ENCOUNTER — Other Ambulatory Visit: Payer: Self-pay | Admitting: *Deleted

## 2022-10-22 DIAGNOSIS — C8195 Hodgkin lymphoma, unspecified, lymph nodes of inguinal region and lower limb: Secondary | ICD-10-CM

## 2022-10-22 DIAGNOSIS — Z0189 Encounter for other specified special examinations: Secondary | ICD-10-CM | POA: Diagnosis not present

## 2022-10-22 DIAGNOSIS — G20A1 Parkinson's disease without dyskinesia, without mention of fluctuations: Secondary | ICD-10-CM | POA: Diagnosis not present

## 2022-10-22 DIAGNOSIS — I10 Essential (primary) hypertension: Secondary | ICD-10-CM | POA: Insufficient documentation

## 2022-10-22 LAB — ECHOCARDIOGRAM COMPLETE
Area-P 1/2: 3.93 cm2
Calc EF: 46.6 %
S' Lateral: 3.7 cm
Single Plane A2C EF: 48 %
Single Plane A4C EF: 45.7 %

## 2022-10-22 MED ORDER — PROCHLORPERAZINE MALEATE 10 MG PO TABS: 10.0000 mg | ORAL_TABLET | Freq: Four times a day (QID) | ORAL | 1 refills | Status: DC | PRN

## 2022-10-22 MED ORDER — DEXAMETHASONE 4 MG PO TABS: ORAL_TABLET | ORAL | 1 refills | Status: DC

## 2022-10-22 MED ORDER — ONDANSETRON HCL 8 MG PO TABS: 8.0000 mg | ORAL_TABLET | Freq: Three times a day (TID) | ORAL | 1 refills | Status: DC | PRN

## 2022-10-22 NOTE — Progress Notes (Signed)
Echocardiogram 2D Echocardiogram has been performed.  Fidel Levy 10/22/2022, 8:43 AM

## 2022-10-22 NOTE — Progress Notes (Unsigned)
Pharmacist Chemotherapy Monitoring - Initial Assessment    Anticipated start date: 10/25/22   The following has been reviewed per standard work regarding the patient's treatment regimen: The patient's diagnosis, treatment plan and drug doses, and organ/hematologic function Lab orders and baseline tests specific to treatment regimen  The treatment plan start date, drug sequencing, and pre-medications Prior authorization status  Patient's documented medication list, including drug-drug interaction screen and prescriptions for anti-emetics and supportive care specific to the treatment regimen The drug concentrations, fluid compatibility, administration routes, and timing of the medications to be used The patient's access for treatment and lifetime cumulative dose history, if applicable  The patient's medication allergies and previous infusion related reactions, if applicable   Changes made to treatment plan:  N/A  Follow up needed:  Checking nivolumab dose with MD.   Nivolumab dose changed to 240 mg per Dr. Antonieta Pert instructions.   Claybon Jabs, [place], 10/22/2022  1:13 PM

## 2022-10-23 ENCOUNTER — Ambulatory Visit: Payer: Commercial Managed Care - PPO | Admitting: Physical Therapy

## 2022-10-23 ENCOUNTER — Other Ambulatory Visit: Payer: Self-pay

## 2022-10-23 ENCOUNTER — Encounter (HOSPITAL_COMMUNITY): Payer: Self-pay

## 2022-10-23 ENCOUNTER — Encounter: Payer: Self-pay | Admitting: Hematology & Oncology

## 2022-10-23 ENCOUNTER — Ambulatory Visit (HOSPITAL_COMMUNITY)
Admission: RE | Admit: 2022-10-23 | Discharge: 2022-10-23 | Disposition: A | Discharge status: Home / Self Care | Payer: Commercial Managed Care - PPO | Source: Ambulatory Visit | Attending: Hematology & Oncology | Admitting: Hematology & Oncology

## 2022-10-23 DIAGNOSIS — C8195 Hodgkin lymphoma, unspecified, lymph nodes of inguinal region and lower limb: Secondary | ICD-10-CM

## 2022-10-23 HISTORY — PX: IR IMAGING GUIDED PORT INSERTION: IMG5740

## 2022-10-23 MED ORDER — FENTANYL CITRATE (PF) 100 MCG/2ML IJ SOLN
INTRAMUSCULAR | Status: AC | PRN
  Administered 2022-10-23 (×3): 50 ug via INTRAVENOUS

## 2022-10-23 MED ORDER — FENTANYL CITRATE (PF) 100 MCG/2ML IJ SOLN
INTRAMUSCULAR | Status: AC
  Filled 2022-10-23: qty 2

## 2022-10-23 MED ORDER — LIDOCAINE-EPINEPHRINE 1 %-1:100000 IJ SOLN
INTRAMUSCULAR | Status: AC
  Administered 2022-10-23: 20 mL
  Filled 2022-10-23: qty 1

## 2022-10-23 MED ORDER — MIDAZOLAM HCL 2 MG/2ML IJ SOLN
INTRAMUSCULAR | Status: AC | PRN
  Administered 2022-10-23 (×3): 1 mg via INTRAVENOUS

## 2022-10-23 MED ORDER — MIDAZOLAM HCL 2 MG/2ML IJ SOLN
INTRAMUSCULAR | Status: AC
  Filled 2022-10-23: qty 2

## 2022-10-23 MED ORDER — HEPARIN SOD (PORK) LOCK FLUSH 100 UNIT/ML IV SOLN
INTRAVENOUS | Status: AC
  Administered 2022-10-23: 500 [IU]
  Filled 2022-10-23: qty 5

## 2022-10-23 NOTE — Progress Notes (Signed)
Pt and spouse received d/c instructions, written and verbal. All questions and concerns addressed. Denies any acute pain or discomfort. PIV removed and intact. Dressing in place to R chest and RIJ, no bleeding noted to procedure site. Will continue to monitor.

## 2022-10-23 NOTE — H&P (Signed)
Chief Complaint: Patient was seen in consultation today for [facility] a cath placement at the request of Ennever,Peter R  Referring Physician(s): Ennever,Peter R  Supervising Physician: Ruthann Cancer  Patient Status: [facility] - Out-pt  History of Present Illness: Brian [name] is a 61 y.o. male   Hx Parkinson's dz Hx Hodgkin's lymphoma  Follow up PET 10/11/22: IMPRESSION: 1. Postoperative changes in the RIGHT axilla. No discrete lymph node in this location. 2. Mediastinal lymph nodes with activity of the "dominant lymph node" adjacent to the LEFT mainstem bronchus which is greater than background liver activity suspicious for new site of disease. 3. Activity greater than blood pool activity in prevascular/AP window lymph node. 4. Small pulmonary nodule or new RIGHT infrahilar lymph node with moderate FDG uptake is also suspicious. Contrasted CT imaging of the chest may be helpful for differentiation as warranted. 5. No signs of disease below the diaphragm. 6. Subcutaneous nodule in the RIGHT gluteal region may represent a and injection granuloma. Correlate with any history of injection in this area. In the absence of history of injection this could be considered mildly suspicious for additional site of disease though displays less metabolic activity than on previous imaging.  Recurrence Hodgkin's Lymphoma To start treatment 10/25/22 Follows with Dr Marin Olp  Has had a PAC in past--- has done well with this  Past Medical History:  Diagnosis Date   Anxiety    Dysrhythmia    past hx pvc   Goals of care, counseling/discussion 09/25/2018   H/O autologous stem cell transplant ([place])    may 2014  at [place]   History of Bell's palsy    2009  RIGHT SIDE--  HAS 80% FUNCTION / PT STATES A LITTLE ASYMETRICAL AND EFFECTS MOUTH   History of radiation therapy 10/18/17-11/05/17   sprine T1 26 Gy in 13 fractions, spine boost 10 Gy in 5 fractions   History of radiation therapy    Right  hip, Rt Sclav- 07/23/22-08/07/11- Dr. Gery Pray   Hodgkin's disease, nodular sclerosis, of inguinal region/lower limb ([place]) ONOLOGIST--  DR Marin Olp AND A [facility]     SALVAGE CHEMO 2013/  AUTOLOGUS STEM CELL TRANSPLANT MAY 2014 AT [facility]   Hypertension    Mass of right chest wall 09/18/2018   Mass of right inguinal region    Neuromuscular disorder ([place])    bilateral feet neuropathy   PVC (premature ventricular contraction)    "benign"   TIA (transient ischemic attack) 02/2015   "probable TIA"   Wears contact lenses     Past Surgical History:  Procedure Laterality Date   AXILLARY LYMPH NODE BIOPSY Left 02/02/2013   Procedure: NEEDLE LOCALIZED AXILLARY LYMPH NODE BIOPSY;  Surgeon: Haywood Lasso, MD;  Location: [place];  Service: General;  Laterality: Left;   BACK SURGERY  03/2021   BONE MARROW BIOPSY  08/26/2012   COLONOSCOPY  2021   CYST REMOVAL NECK Left 11/26/1978   LEFT INGUINAL LYMPH NODE BX  09/09/2012   LYMPH NODE BIOPSY N/A 07/28/2015   Procedure: LYMPH NODE BIOPSY;  Surgeon: Melrose Nakayama, MD;  Location: [place];  Service: Thoracic;  Laterality: N/A;   LYMPH NODE BIOPSY Right 06/29/2022   Procedure: RIGHT AXILLARY NODE BIOPSY;  Surgeon: Melrose Nakayama, MD;  Location: [place];  Service: Vascular;  Laterality: Right;   MASS EXCISION Right 09/19/2018   Procedure: EXCISION OF RIGHT CHEST WALL MASS ERAS PATHWAY;  Surgeon: Fanny Skates, MD;  Location: [place];  Service: General;  Laterality: Right;   NODE DISSECTION Right 07/28/2015   Procedure: NODE DISSECTION;  Surgeon: Melrose Nakayama, MD;  Location: [place];  Service: Thoracic;  Laterality: Right;   PLEURA BIOPSY Left 05/31/2014   REMOVAL RIGHT INGUINAL LYMPH NODES  08/16/2011   SCROTAL EXPLORATION Right 01/04/2014   Procedure: SCROTUM EXPLORATION   INGUINAL , EXCISION OF CYSTIC MASS OF RIGHT SPERMATIC CORD, WITH FROZEN SECTION;  Surgeon: Franchot Gallo, MD;  Location:  [facility];  Service: Urology;  Laterality: Right;   TRANSTHORACIC ECHOCARDIOGRAM  03/18/2013   MILD LVH/  EF 55-60%   VIDEO ASSISTED THORACOSCOPY Left 05/31/2014   Procedure: LEFT VIDEO ASSISTED THORACOSCOPY, PLEURAL BIOPSY;  Surgeon: Melrose Nakayama, MD;  Location: [place];  Service: Thoracic;  Laterality: Left;   VIDEO ASSISTED THORACOSCOPY Right 07/28/2015   Procedure: RIGHT VIDEO ASSISTED THORACOSCOPY;  Surgeon: Melrose Nakayama, MD;  Location: [place];  Service: Thoracic;  Laterality: Right;   VIDEO ASSISTED THORACOSCOPY (VATS)/ LYMPH NODE SAMPLING Right 07/28/2015   VIDEO ASSISTED THORACOSCOPY (VATS)/WEDGE RESECTION  05/31/2014   VIDEO BRONCHOSCOPY WITH ENDOBRONCHIAL ULTRASOUND  07/28/2015   VIDEO BRONCHOSCOPY WITH ENDOBRONCHIAL ULTRASOUND N/A 07/28/2015   Procedure: VIDEO BRONCHOSCOPY WITH ENDOBRONCHIAL ULTRASOUND;  Surgeon: Melrose Nakayama, MD;  Location: [facility];  Service: Thoracic;  Laterality: N/A;    Allergies: Patient has no known allergies.  Medications: Prior to Admission medications   Medication Sig Start Date End Date Taking? Authorizing Provider  acetaminophen (TYLENOL) 500 MG tablet Take 1,000 mg by mouth every 8 (eight) hours as needed for moderate pain.   Yes [provider]  carbidopa-levodopa (SINEMET CR) 50-200 MG tablet TAKE 1 TABLET BY MOUTH AT BEDTIME 08/14/22  Yes Tat, Rebecca S, DO  carbidopa-levodopa (SINEMET IR) 25-100 MG tablet TAKE 2 TABLETS AT 7AM, 11AM, 3PM, AND AT 7PM DAILY. Patient taking differently: Take 2 tablets by mouth See admin instructions. TAKE 2 TABLETS AT [place], 3PM2 08/06/22  Yes Tat, Eustace Quail, DO  cholecalciferol (VITAMIN D3) 25 MCG (1000 UNIT) tablet Take 1,000 Units by mouth daily.   Yes [provider]  ibuprofen (ADVIL) 200 MG tablet Take 200 mg by mouth every 6 (six) hours as needed for moderate pain.   Yes [provider]  olmesartan (BENICAR) 20 MG tablet Take 0.5 tablets (10 mg  total) by mouth daily. 09/27/22  Yes Copland, [name], MD  pramipexole (MIRAPEX) 0.5 MG tablet TAKE 1 TABLET(0.5 MG) BY MOUTH THREE TIMES DAILY 06/25/22  Yes Tat, Eustace Quail, DO  traMADol (ULTRAM) 50 MG tablet Take 1 tablet (50 mg total) by mouth every 8 (eight) hours as needed. 09/27/22  Yes Copland, [name], MD  dexamethasone (DECADRON) 4 MG tablet Take 2 tablets (8 mg) by mouth daily for 3 days after chemotherapy. Take with food. 10/22/22   [name], MD  ondansetron (ZOFRAN) 8 MG tablet Take 1 tablet (8 mg total) by mouth every 8 (eight) hours as needed for nausea or vomiting. Start on the third day after chemotherapy. 10/22/22   [name], MD  prochlorperazine (COMPAZINE) 10 MG tablet Take 1 tablet (10 mg total) by mouth every 6 (six) hours as needed for nausea or vomiting. 10/22/22   [name], MD     Family History  Problem Relation Age of Onset   Cancer Mother 57       ovarian   Ovarian cancer Mother 72   Heart disease Father    Stroke  Father    Cancer Brother        testicular   Hypertension Brother    Healthy Son    Healthy Son    Hypertension Brother    Other Brother        CMT   Hypertension Brother     Social History   Socioeconomic History   Marital status: Divorced    Spouse name: Not on file   Number of children: 2   Years of education: Not on file   Highest education level: Bachelor's degree (e.g., BA, AB, BS)  Occupational History   Occupation: Oncologist: [employer]  Tobacco Use   Smoking status: Never   Smokeless tobacco: Never  Vaping Use   Vaping Use: Never used  Substance and Sexual Activity   Alcohol use: Yes    Alcohol/week: 14.0 standard drinks of alcohol    Types: 7 Glasses of wine, 7 Cans of beer per week    Comment: one drink per night   Drug use: No   Sexual activity: Not Currently  Other Topics Concern   Not on file  Social History Narrative   DIVORCE. Education: [employer]. Exercise: jog/walk/  lift weights 7 days a week, 1-2 miles.   Right handed   2 children   Social Determinants of Health   Financial Resource Strain: Not on file  Food Insecurity: Not on file  Transportation Needs: Not on file  Physical Activity: Not on file  Stress: Not on file  Social Connections: Not on file    Review of Systems: A 12 point ROS discussed and pertinent positives are indicated in the HPI above.  All other systems are negative.  Review of Systems  Constitutional:  Negative for activity change, fatigue, fever and unexpected weight change.  Respiratory:  Negative for cough and shortness of breath.   Cardiovascular:  Negative for chest pain.  Psychiatric/Behavioral:  Negative for behavioral problems and confusion.     Vital Signs: BP 122/83   Pulse 80   Temp 97.8 F (36.6 C) (Temporal)   Resp 17   Ht _0  (1.778 m)   Wt 175 lb (79.4 kg)   SpO2 97%   BMI 25.11 kg/m     Physical Exam Vitals reviewed.  HENT:     Mouth/Throat:     Mouth: Mucous membranes are moist.  Cardiovascular:     Rate and Rhythm: Normal rate and regular rhythm.     Heart sounds: Normal heart sounds.  Pulmonary:     Effort: Pulmonary effort is normal.     Breath sounds: Normal breath sounds.  Abdominal:     Palpations: Abdomen is soft.  Musculoskeletal:        General: Normal range of motion.  Skin:    General: Skin is warm.  Neurological:     Mental Status: He is alert and oriented to person, place, and time.  Psychiatric:        Behavior: Behavior normal.     Imaging: ECHOCARDIOGRAM COMPLETE  Result Date: 10/22/2022    ECHOCARDIOGRAM REPORT   Patient Name:   Brian [name] Date of Exam: 10/22/2022 Medical Rec #:  [id number]    Height:       70.0 in Accession #:    [id number]   Weight:       177.0 lb Date of Birth:  08-Mar-1961    BSA:          1.982 m Patient Age:  61 years     BP:           132/78 mmHg Patient Gender: M            HR:           76 bpm. Exam Location:  Outpatient Procedure:  2D Echo, Cardiac Doppler, Color Doppler and Strain Analysis Indications:     Chemo Z09  History:         Patient has prior history of Echocardiogram examinations, most                  recent 03/05/2015. TIA; Risk Factors:Hypertension.  Sonographer:     Bernadene Person RDCS Referring Phys:  [place] Diagnosing Phys: Oswaldo Milian MD  Sonographer Comments: Global longitudinal strain was attempted. IMPRESSIONS  1. Left ventricular ejection fraction, by estimation, is 50 to 55%. The left ventricle has low normal function. The left ventricle has no regional wall motion abnormalities. Left ventricular diastolic parameters were normal.  2. Right ventricular systolic function is normal. The right ventricular size is normal. Tricuspid regurgitation signal is inadequate for assessing PA pressure.  3. The mitral valve is normal in structure. Trivial mitral valve regurgitation. No evidence of mitral stenosis.  4. The aortic valve was not well visualized. Aortic valve regurgitation is not visualized. No aortic stenosis is present.  5. The inferior vena cava is normal in size with greater than 50% respiratory variability, suggesting right atrial pressure of 3 mmHg. FINDINGS  Left Ventricle: Left ventricular ejection fraction, by estimation, is 50 to 55%. The left ventricle has low normal function. The left ventricle has no regional wall motion abnormalities. The left ventricular internal cavity size was normal in size. There is no left ventricular hypertrophy. Left ventricular diastolic parameters were normal. Right Ventricle: The right ventricular size is normal. No increase in right ventricular wall thickness. Right ventricular systolic function is normal. Tricuspid regurgitation signal is inadequate for assessing PA pressure. Left Atrium: Left atrial size was normal in size. Right Atrium: Right atrial size was normal in size. Pericardium: There is no evidence of pericardial effusion. Mitral Valve: The mitral  valve is normal in structure. Trivial mitral valve regurgitation. No evidence of mitral valve stenosis. Tricuspid Valve: The tricuspid valve is normal in structure. Tricuspid valve regurgitation is trivial. Aortic Valve: The aortic valve was not well visualized. Aortic valve regurgitation is not visualized. No aortic stenosis is present. Pulmonic Valve: The pulmonic valve was not well visualized. Pulmonic valve regurgitation is not visualized. Aorta: The aortic root and ascending aorta are structurally normal, with no evidence of dilitation. Venous: The inferior vena cava is normal in size with greater than 50% respiratory variability, suggesting right atrial pressure of 3 mmHg. IAS/Shunts: The interatrial septum was not well visualized.  LEFT VENTRICLE PLAX 2D LVIDd:         5.20 cm      Diastology LVIDs:         3.70 cm      LV e' medial:    7.72 cm/s LV PW:         0.80 cm      LV E/e' medial:  5.5 LV IVS:        0.80 cm      LV e' lateral:   8.16 cm/s LVOT diam:     2.30 cm      LV E/e' lateral: 5.2 LV SV:         59 LV SV Index:  30 LVOT Area:     4.15 cm  LV Volumes (MOD) LV vol d, MOD A2C: 122.0 ml LV vol d, MOD A4C: 96.6 ml LV vol s, MOD A2C: 63.5 ml LV vol s, MOD A4C: 52.5 ml LV SV MOD A2C:     58.5 ml LV SV MOD A4C:     96.6 ml LV SV MOD BP:      51.6 ml RIGHT VENTRICLE RV S prime:     10.40 cm/s TAPSE (M-mode): 2.3 cm LEFT ATRIUM             Index        RIGHT ATRIUM           Index LA diam:        3.50 cm 1.77 cm/m   RA Area:     16.50 cm LA Vol (A2C):   50.4 ml 25.43 ml/m  RA Volume:   42.00 ml  21.19 ml/m LA Vol (A4C):   42.0 ml 21.19 ml/m LA Biplane Vol: 47.4 ml 23.92 ml/m  AORTIC VALVE LVOT Vmax:   79.00 cm/s LVOT Vmean:  49.100 cm/s LVOT VTI:    0.142 m  AORTA Ao Root diam: 3.70 cm Ao Asc diam:  3.60 cm MITRAL VALVE MV Area (PHT): 3.93 cm    SHUNTS MV Decel Time: 193 msec    Systemic VTI:  0.14 m MV E velocity: 42.20 cm/s  Systemic Diam: 2.30 cm MV A velocity: 49.60 cm/s MV E/A ratio:  0.85  Oswaldo Milian MD Electronically signed by Oswaldo Milian MD Signature Date/Time: 10/22/2022/6:23:53 PM    Final (Updated)    NM PET Image Restag (PS) Skull Base To Thigh  Result Date: 10/12/2022 CLINICAL DATA:  Subsequent treatment strategy for Hodgkin's lymphoma with recurrence. EXAM: NUCLEAR MEDICINE PET SKULL BASE TO THIGH TECHNIQUE: 8.73 mCi F-18 FDG was injected intravenously. Full-ring PET imaging was performed from the skull base to thigh after the radiotracer. CT data was obtained and used for attenuation correction and anatomic localization. Fasting blood glucose: 76 mg/dl COMPARISON:  Multiple prior studies most recent of May 07, 2022. FINDINGS: Mediastinal blood pool activity: SUV max 2.0 Liver activity: SUV max 2.76 NECK: No hypermetabolic lymph nodes in the neck. Incidental CT findings: None. CHEST: Postoperative changes apparent at the site of RIGHT axillary lymph nodes seen on previous imaging. No discrete lymph node in this location. (Image 62/4) 9 mm LEFT juxta hilar lymph node between the aorta and LEFT mainstem bronchus with a maximum SUV of 4.32. Anterior mediastinal lymph node, high AP window/prevascular lymph node (image 55/4) 9 mm with a maximum SUV of 2.59. (Image 68/4) in the RIGHT azygoesophageal recess nodular area potentially in lung parenchyma or small juxta hilar lymph node (image 37/7) lung window portion of the study measuring 8 mm with a maximum SUV of 3.58. No frank adenopathy in the chest or additional areas of increased metabolic activity. Incidental CT findings: None. ABDOMEN/PELVIS: No abnormal hypermetabolic activity within the liver, pancreas, adrenal glands, or spleen. No hypermetabolic lymph nodes in the abdomen or pelvis. Incidental CT findings: None. SKELETON: No focal hypermetabolic activity to suggest skeletal metastasis. Incidental CT findings: 8 mm subcutaneous nodule in the subcutaneous fat overlying the RIGHT gluteal region previously 6 mm showing  a maximum SUV of 2.71 as compared to 3.31. IMPRESSION: 1. Postoperative changes in the RIGHT axilla. No discrete lymph node in this location. 2. Mediastinal lymph nodes with activity of the "dominant lymph node" adjacent to the LEFT mainstem  bronchus which is greater than background liver activity suspicious for new site of disease. 3. Activity greater than blood pool activity in prevascular/AP window lymph node. 4. Small pulmonary nodule or new RIGHT infrahilar lymph node with moderate FDG uptake is also suspicious. Contrasted CT imaging of the chest may be helpful for differentiation as warranted. 5. No signs of disease below the diaphragm. 6. Subcutaneous nodule in the RIGHT gluteal region may represent a and injection granuloma. Correlate with any history of injection in this area. In the absence of history of injection this could be considered mildly suspicious for additional site of disease though displays less metabolic activity than on previous imaging. Electronically Signed   By: Zetta Bills M.D.   On: 10/12/2022 09:40    Labs:  CBC: Recent Labs    06/27/22 0950 07/10/22 0856 08/21/22 0850 10/16/22 0953  WBC 6.0 5.5 4.7 6.8  HGB 13.1 12.8* 12.9* 13.0  HCT 40.4 38.7* 39.7 40.4  PLT 222 252 204 239    COAGS: Recent Labs    06/27/22 0950  INR 1.1  APTT 23*    BMP: Recent Labs    06/27/22 0950 07/10/22 0856 08/21/22 0850 10/16/22 0953  NA 141 139 138 140  K 4.5 4.4 4.9 5.1  CL 107 105 104 106  CO2 _0 GLUCOSE 100* 99 93 97  BUN 29* 35* 33* 49*  CALCIUM 9.6 9.6 9.8 9.5  CREATININE 1.16 1.22 1.37* 1.70*  GFRNONAA >60 >60 58* 45*    LIVER FUNCTION TESTS: Recent Labs    06/27/22 0950 07/10/22 0856 08/21/22 0850 10/16/22 0953  BILITOT 0.7 0.7 0.7 0.6  AST _1 ALT 5 <_2 ALKPHOS 76 77 75 83  PROT 6.9 7.0 6.9 6.9  ALBUMIN 4.2 4.5 4.4 4.6    TUMOR MARKERS: No results for input(s): "AFPTM", "CEA", "CA199", "CHROMGRNA" in the last 8760  hours.  Assessment and Plan:  Hodgkin's Lymphoma recurrence To start chemotherapy 11/30 Scheduled for Port a cath placement in IR today Risks and benefits of image guided port-a-catheter placement was discussed with the patient including, but not limited to bleeding, infection, pneumothorax, or fibrin sheath development and need for additional procedures.  All of the patient's questions were answered, patient is agreeable to proceed. Consent signed and in chart.   Thank you for this interesting consult.  I greatly enjoyed meeting AIZEN DUVAL and look forward to participating in their care.  A copy of this report was sent to the requesting provider on this date.  Electronically Signed: Lavonia Drafts, PA-C 10/23/2022, 7:15 AM   I spent a total of    25 Minutes in face to face in clinical consultation, greater than 50% of which was counseling/coordinating care for [facility] placement

## 2022-10-23 NOTE — Procedures (Signed)
Interventional Radiology Procedure Note ° °Procedure: Single Lumen Power Port Placement   ° °Access:  Right internal jugular vein ° °Findings: Catheter tip positioned at cavoatrial junction. Port is ready for immediate use.  ° °Complications: None ° °EBL: < 10 mL ° °Recommendations:  °- Ok to shower in 24 hours °- Do not submerge for 7 days °- Routine line care  ° ° °Justice Milliron, MD ° ° ° °

## 2022-10-25 ENCOUNTER — Inpatient Hospital Stay: Payer: Commercial Managed Care - PPO

## 2022-10-25 ENCOUNTER — Ambulatory Visit: Payer: Commercial Managed Care - PPO | Admitting: Physical Therapy

## 2022-10-25 ENCOUNTER — Other Ambulatory Visit: Payer: Commercial Managed Care - PPO

## 2022-10-25 ENCOUNTER — Ambulatory Visit: Payer: Commercial Managed Care - PPO

## 2022-10-25 VITALS — BP 155/87 | HR 84 | Temp 98.6°F | Resp 18

## 2022-10-25 DIAGNOSIS — C8195 Hodgkin lymphoma, unspecified, lymph nodes of inguinal region and lower limb: Secondary | ICD-10-CM

## 2022-10-25 DIAGNOSIS — Z5111 Encounter for antineoplastic chemotherapy: Secondary | ICD-10-CM | POA: Diagnosis not present

## 2022-10-25 LAB — CMP (CANCER CENTER ONLY)
ALT: <5 U/L (ref 0–44)
AST: 17 U/L (ref 15–41)
Albumin: 4.4 g/dL (ref 3.5–5.0)
Alkaline Phosphatase: 84 U/L (ref 38–126)
Anion gap: 6 (ref 5–15)
BUN: 29 mg/dL — ABNORMAL HIGH (ref 8–23)
CO2: 29 mmol/L (ref 22–32)
Calcium: 9.6 mg/dL (ref 8.9–10.3)
Chloride: 104 mmol/L (ref 98–111)
Creatinine: 1.19 mg/dL (ref 0.61–1.24)
GFR, Estimated: >60 mL/min (ref >60)
Glucose, Bld: 73 mg/dL (ref 70–99)
Potassium: 4.1 mmol/L (ref 3.5–5.1)
Sodium: 139 mmol/L (ref 135–145)
Total Bilirubin: 0.5 mg/dL (ref 0.3–1.2)
Total Protein: 6.4 g/dL — ABNORMAL LOW (ref 6.5–8.1)

## 2022-10-25 LAB — CBC WITH DIFFERENTIAL (CANCER CENTER ONLY)
Abs Immature Granulocytes: 0.03 10*3/uL (ref 0.00–0.07)
Basophils Absolute: 0.1 10*3/uL (ref 0.0–0.1)
Basophils Relative: 1 %
Eosinophils Absolute: 0.1 10*3/uL (ref 0.0–0.5)
Eosinophils Relative: 2 %
HCT: 36.6 % — ABNORMAL LOW (ref 39.0–52.0)
Hemoglobin: 11.8 g/dL — ABNORMAL LOW (ref 13.0–17.0)
Immature Granulocytes: 1 %
Lymphocytes Relative: 17 %
Lymphs Abs: 1.1 10*3/uL (ref 0.7–4.0)
MCH: 32.1 pg (ref 26.0–34.0)
MCHC: 32.2 g/dL (ref 30.0–36.0)
MCV: 99.5 fL (ref 80.0–100.0)
Monocytes Absolute: 0.9 10*3/uL (ref 0.1–1.0)
Monocytes Relative: 13 %
Neutro Abs: 4.3 10*3/uL (ref 1.7–7.7)
Neutrophils Relative %: 66 %
Platelet Count: 238 10*3/uL (ref 150–400)
RBC: 3.68 MIL/uL — ABNORMAL LOW (ref 4.22–5.81)
RDW: 12.6 % (ref 11.5–15.5)
WBC Count: 6.5 10*3/uL (ref 4.0–10.5)
nRBC: 0 % (ref 0.0–0.2)

## 2022-10-25 MED ORDER — SODIUM CHLORIDE 0.9% FLUSH
10.0000 mL | INTRAVENOUS | Status: DC | PRN
  Administered 2022-10-25: 10 mL

## 2022-10-25 MED ORDER — SODIUM CHLORIDE 0.9 % IV SOLN
10.0000 mg | Freq: Once | INTRAVENOUS | Status: AC
  Administered 2022-10-25: 10 mg via INTRAVENOUS
  Filled 2022-10-25: qty 10

## 2022-10-25 MED ORDER — SODIUM CHLORIDE 0.9 % IV SOLN
240.0000 mg | Freq: Once | INTRAVENOUS | Status: AC
  Administered 2022-10-25: 240 mg via INTRAVENOUS
  Filled 2022-10-25: qty 24

## 2022-10-25 MED ORDER — LACTATED RINGERS IV SOLN
250.0000 mg/m2 | Freq: Once | INTRAVENOUS | Status: AC
  Administered 2022-10-25: 500 mg via INTRAVENOUS
  Filled 2022-10-25: qty 50

## 2022-10-25 MED ORDER — SODIUM CHLORIDE 0.9 % IV SOLN: Freq: Once | INTRAVENOUS | Status: AC

## 2022-10-25 MED ORDER — DOXORUBICIN HCL CHEMO IV INJECTION 2 MG/ML
25.0000 mg/m2 | Freq: Once | INTRAVENOUS | Status: AC
  Administered 2022-10-25: 50 mg via INTRAVENOUS
  Filled 2022-10-25: qty 25

## 2022-10-25 MED ORDER — VINBLASTINE SULFATE CHEMO INJECTION 1 MG/ML
6.0000 mg/m2 | Freq: Once | INTRAVENOUS | Status: AC
  Administered 2022-10-25: 11.9 mg via INTRAVENOUS
  Filled 2022-10-25: qty 11.9

## 2022-10-25 MED ORDER — SODIUM CHLORIDE 0.9 % IV SOLN
375.0000 mg/m2 | Freq: Once | INTRAVENOUS | Status: AC
  Administered 2022-10-25: 750 mg via INTRAVENOUS
  Filled 2022-10-25: qty 75

## 2022-10-25 MED ORDER — HEPARIN SOD (PORK) LOCK FLUSH 100 UNIT/ML IV SOLN
500.0000 [IU] | Freq: Once | INTRAVENOUS | Status: AC | PRN
  Administered 2022-10-25: 500 [IU]

## 2022-10-25 MED ORDER — SODIUM CHLORIDE 0.9 % IV SOLN
150.0000 mg | Freq: Once | INTRAVENOUS | Status: AC
  Administered 2022-10-25: 150 mg via INTRAVENOUS
  Filled 2022-10-25: qty 150

## 2022-10-25 MED ORDER — PALONOSETRON HCL INJECTION 0.25 MG/5ML
0.2500 mg | Freq: Once | INTRAVENOUS | Status: AC
  Administered 2022-10-25: 0.25 mg via INTRAVENOUS
  Filled 2022-10-25: qty 5

## 2022-10-25 NOTE — Patient Instructions (Signed)

## 2022-10-25 NOTE — Patient Instructions (Addendum)
[facility] AT [facility]  Discharge Instructions: Thank you for choosing [facility] to provide your oncology and hematology care.   If you have a lab appointment with the [facility], please go directly to the [facility] and check in at the registration area.  Wear comfortable clothing and clothing appropriate for easy access to any Portacath or PICC line.   We strive to give you quality time with your provider. You may need to reschedule your appointment if you arrive late (15 or more minutes).  Arriving late affects you and other patients whose appointments are after yours.  Also, if you miss three or more appointments without notifying the office, you may be dismissed from the clinic at the provider's discretion.      For prescription refill requests, have your pharmacy contact our office and allow 72 hours for refills to be completed.    Today you received the following chemotherapy and/or immunotherapy agents Adriamycin, Dacarbazine, Velban, Opdivo      To help prevent nausea and vomiting after your treatment, we encourage you to take your nausea medication as directed.  Beginning 10/26/2022 Scheduled 1) Take Dexamethasone 2 tablets in the morning for 3 days.  AS NEEDED 1) At any time, take Compazine (Prochlorperazine) 10 mg as needed every 6 hours for nausea, 2) Beginning Saturday, 10/27/2022 if you have nausea, take Ondansetron (Zofran) for nausea or vomiting.   BELOW ARE SYMPTOMS THAT SHOULD BE REPORTED IMMEDIATELY: *FEVER GREATER THAN 100.4 F (38 C) OR HIGHER *CHILLS OR SWEATING *NAUSEA AND VOMITING THAT IS NOT CONTROLLED WITH YOUR NAUSEA MEDICATION *UNUSUAL SHORTNESS OF BREATH *UNUSUAL BRUISING OR BLEEDING *URINARY PROBLEMS (pain or burning when urinating, or frequent urination) *BOWEL PROBLEMS (unusual diarrhea, constipation, pain near the anus) TENDERNESS IN MOUTH AND THROAT WITH OR WITHOUT PRESENCE OF ULCERS (sore throat, sores in mouth,  or a toothache) UNUSUAL RASH, SWELLING OR PAIN  UNUSUAL VAGINAL DISCHARGE OR ITCHING   Items with * indicate a potential emergency and should be followed up as soon as possible or go to the Emergency Department if any problems should occur.  Please show the CHEMOTHERAPY ALERT CARD or IMMUNOTHERAPY ALERT CARD at check-in to the Emergency Department and triage nurse. Should you have questions after your visit or need to cancel or reschedule your appointment, please contact [facility]  [phone] and follow the prompts.  Office hours are 8:00 a.m. to 4:30 p.m. Monday - Friday. Please note that voicemails left after 4:00 p.m. may not be returned until the following business day.  We are closed weekends and major holidays. You have access to a nurse at all times for urgent questions. Please call the main number to the clinic [phone] and follow the prompts.  For any non-urgent questions, you may also contact your provider using MyChart. We now offer e-Visits for anyone 10 and older to request care online for non-urgent symptoms. For details visit [email or web address].   Also download the MyChart app! Go to the app store, search "MyChart", open the app, select [facility], and log in with your MyChart username and password.  Masks are optional in the cancer centers. If you would like for your care team to wear a mask while they are taking care of you, please let them know. You may have one support person who is at least 62 years old accompany you for your appointments.

## 2022-10-25 NOTE — Progress Notes (Signed)
Ok to treat today with labs from 10/16/2022 per Dr Marin Olp

## 2022-10-26 ENCOUNTER — Encounter: Payer: Self-pay | Admitting: Hematology & Oncology

## 2022-10-29 ENCOUNTER — Encounter: Payer: Self-pay | Admitting: Physical Therapy

## 2022-10-29 ENCOUNTER — Ambulatory Visit: Payer: Commercial Managed Care - PPO | Attending: Neurology | Admitting: Physical Therapy

## 2022-10-29 DIAGNOSIS — R2689 Other abnormalities of gait and mobility: Secondary | ICD-10-CM | POA: Diagnosis present

## 2022-10-29 DIAGNOSIS — R2681 Unsteadiness on feet: Secondary | ICD-10-CM | POA: Diagnosis present

## 2022-10-29 NOTE — Therapy (Signed)
OUTPATIENT PHYSICAL THERAPY NEURO TREATMENT NOTE   Patient Name: Brian [name] MRN: [id number] DOB:[birth year], 61 y.o., male Today's Date: 10/29/2022   PCP: [name]. Copland, MD REFERRING PROVIDER: Alonza Bogus, DO   PT End of Session - 10/29/22 1103     Visit Number 3    Number of Visits 8    Date for PT Re-Evaluation 11/09/22    Authorization Type [employer]    PT Start Time 1104    PT Stop Time 1144    PT Time Calculation (min) 40 min    Activity Tolerance Patient tolerated treatment well    Behavior During Therapy WFL for tasks assessed/performed              Past Medical History:  Diagnosis Date   Anxiety    Dysrhythmia    past hx pvc   Goals of care, counseling/discussion 09/25/2018   H/O autologous stem cell transplant ([address])    may 2014  at [facility]   History of Bell's palsy    2009  RIGHT SIDE--  HAS 80% FUNCTION / PT STATES A LITTLE ASYMETRICAL AND EFFECTS MOUTH   History of radiation therapy 10/18/17-11/05/17   sprine T1 26 Gy in 13 fractions, spine boost 10 Gy in 5 fractions   History of radiation therapy    Right hip, Rt Sclav- 07/23/22-08/07/11- Dr. Gery Pray   Hodgkin's disease, nodular sclerosis, of inguinal region/lower limb ([place]) ONOLOGIST--  DR Marin Olp AND A [facility]     [place] CHEMO 2013/  AUTOLOGUS STEM CELL TRANSPLANT MAY 2014 AT [facility]   Hypertension    Mass of right chest wall 09/18/2018   Mass of right inguinal region    Neuromuscular disorder ([place])    bilateral feet neuropathy   PVC (premature ventricular contraction)    "benign"   TIA (transient ischemic attack) 02/2015   "probable TIA"   Wears contact lenses    Past Surgical History:  Procedure Laterality Date   AXILLARY LYMPH NODE BIOPSY Left 02/02/2013   Procedure: NEEDLE LOCALIZED AXILLARY LYMPH NODE BIOPSY;  Surgeon: Haywood Lasso, MD;  Location: [place];  Service: General;  Laterality: Left;   BACK SURGERY  03/2021   BONE MARROW BIOPSY  08/26/2012   COLONOSCOPY   2021   CYST REMOVAL NECK Left 11/26/1978   IR IMAGING GUIDED PORT INSERTION  10/23/2022   LEFT INGUINAL LYMPH NODE BX  09/09/2012   LYMPH NODE BIOPSY N/A 07/28/2015   Procedure: LYMPH NODE BIOPSY;  Surgeon: Melrose Nakayama, MD;  Location: [place];  Service: Thoracic;  Laterality: N/A;   LYMPH NODE BIOPSY Right 06/29/2022   Procedure: RIGHT AXILLARY NODE BIOPSY;  Surgeon: Melrose Nakayama, MD;  Location: [place];  Service: Vascular;  Laterality: Right;   MASS EXCISION Right 09/19/2018   Procedure: EXCISION OF RIGHT CHEST WALL MASS ERAS PATHWAY;  Surgeon: Fanny Skates, MD;  Location: [place];  Service: General;  Laterality: Right;   NODE DISSECTION Right 07/28/2015   Procedure: NODE DISSECTION;  Surgeon: Melrose Nakayama, MD;  Location: [place];  Service: Thoracic;  Laterality: Right;   PLEURA BIOPSY Left 05/31/2014   REMOVAL RIGHT INGUINAL LYMPH NODES  08/16/2011   SCROTAL EXPLORATION Right 01/04/2014   Procedure: SCROTUM EXPLORATION   INGUINAL , EXCISION OF CYSTIC MASS OF RIGHT SPERMATIC CORD, [place];  Surgeon: Franchot Gallo, MD;  Location: [facility];  Service: Urology;  Laterality: Right;   TRANSTHORACIC ECHOCARDIOGRAM  03/18/2013  MILD LVH/  EF 55-60%   VIDEO ASSISTED THORACOSCOPY Left 05/31/2014   Procedure: LEFT VIDEO ASSISTED THORACOSCOPY, PLEURAL BIOPSY;  Surgeon: Melrose Nakayama, MD;  Location: [place];  Service: Thoracic;  Laterality: Left;   VIDEO ASSISTED THORACOSCOPY Right 07/28/2015   Procedure: RIGHT VIDEO ASSISTED THORACOSCOPY;  Surgeon: Melrose Nakayama, MD;  Location: [place];  Service: Thoracic;  Laterality: Right;   VIDEO ASSISTED THORACOSCOPY (VATS)/ LYMPH NODE SAMPLING Right 07/28/2015   VIDEO ASSISTED THORACOSCOPY (VATS)/WEDGE RESECTION  05/31/2014   VIDEO BRONCHOSCOPY WITH ENDOBRONCHIAL ULTRASOUND  07/28/2015   VIDEO BRONCHOSCOPY WITH ENDOBRONCHIAL ULTRASOUND N/A 07/28/2015   Procedure: VIDEO  BRONCHOSCOPY WITH ENDOBRONCHIAL ULTRASOUND;  Surgeon: Melrose Nakayama, MD;  Location: [place];  Service: Thoracic;  Laterality: N/A;   Patient Active Problem List   Diagnosis Date Noted   Primary hypertension 09/12/2022   Closed fracture of right proximal humerus 10/03/2021   Traumatic complete tear of right rotator cuff 10/03/2021   Lumbar radiculopathy 10/03/2021   Goals of care, counseling/discussion 09/25/2018   Mass of right chest wall 09/18/2018   PD (Parkinson's disease) 11/27/2016   Lymphadenopathy 07/28/2015   Mediastinal adenopathy 07/14/2015   Aphasia 03/04/2015   Slurred speech 03/04/2015   Pleural mass 05/31/2014   Abdominal pain, epigastric 02/02/2014   Weight loss 08/12/2012   H/O Bell's palsy 08/12/2012   Hodgkin's disease 08/09/2011    ONSET DATE: MD referral date 09/27/2022 (as 66-monthfollow up from discharge)  REFERRING DIAG: G20.B2 (ICD-10-CM) - Parkinson's disease with dyskinesia and fluctuating manifestations   THERAPY DIAG:  Other abnormalities of gait and mobility  Unsteadiness on feet  Rationale for Evaluation and Treatment: Rehabilitation  SUBJECTIVE:                                                                                                                                                                                             SUBJECTIVE STATEMENT: Back feels better.  Started chemo last week-every other week through December.  No falls since last visit.   Pt accompanied by: self  PERTINENT HISTORY: See above  PAIN:  Are you having pain? Yes: NPRS scale: 3/10 Pain location: hips, shoulder, back Pain description: sore all over Aggravating factors: all movements, getting up from sitting Relieving factors: tylenol, stretching (somewhat)  PRECAUTIONS: Fall and Other: hx of Hodgkins/recent radiation treatments   WEIGHT BEARING RESTRICTIONS: No  FALLS: Has patient fallen in last 6 months? No  LIVING ENVIRONMENT: Lives with: lives  with their family Lives in: House/apartment Stairs: 2 Has following equipment at home: Single point cane, WEnvironmental consultant- 2 wheeled, and Wheelchair (manual)  PLOF: Independent  and planning to retire   Going to [place]: Want the pain to stop  OBJECTIVE:      Reviewed exercises given at last session as part of HEP, with pt return demo understanding.  Pt continues to provide cues to reinforce start/stop and regaining balance in midline.  TODAY'S TREATMENT: 10/29/2022 Activity Comments  Heel/toe raises x 10 reps Cues for technique  Stagger stance forward/back weightshifting Cues for technique -difficulty sequencing weightshift today.     Step strategy work: Forward and side step over obstacle  Cues for deliberate motion-start/stop, cues for wider BOS  Multi-direction stepping for varied directions of balance challenge Cues for technique-used color dots and "clock face" cues to increase cognitive load for dual tasking.  Forward/back step and weightshift x 10 reps   Monster walk forward/back 3 reps Added green theraband, cues for wide BOS for stopping to regain balance  Resisted forward gait/then backward gait, 5 reps with min assist; then with weight removed forward/back gait with cues for BIG, deliberate effort 15# at weight rack   PATIENT EDUCATION: Education details: HEP update; discussed tips to reduce festination episodes:  Wide BOS and upright posture to STOP and then take BIG steps to restart Person educated: Patient Education method: Explanation, Demonstration, and Handouts Education comprehension: verbalized understanding, returned demonstration, and needs further education  Access Code: [email or web address] URL: [email or web address] Date: 10/29/2022 Prepared by: [facility]  Exercises - Alternating Step forward-Balance Reaction  - 1 x daily - 5 x weekly - 1-2 sets - 10 reps - Alternating Step Backward with Support  -  1 x daily - 5 x weekly - 1-2 sets - 10 reps - Step Sideways  - 1 x daily - 5 x weekly - 1-2 sets - 10 reps - Forward Monster Walk with Resistance at Ankles and Counter Support  - 1 x daily - 7 x weekly - 1 sets - 3-5 reps   -------------------------------------------------------------------------------------- Objective Measures below are from PT evaluation:  DIAGNOSTIC FINDINGS: Awaiting PET scan (11/16), MRI for low back (12/7)   COGNITION: Overall cognitive status: Within functional limits for tasks assessed   SENSATION: Reports numbness in legs in the mornings   POSTURE: rounded shoulders, forward head, posterior pelvic tilt, and dyskinesias at time  LOWER EXTREMITY ROM:   WFL in sitting  Active  Right Eval Left Eval  Hip flexion    Hip extension    Hip abduction    Hip adduction    Hip internal rotation    Hip external rotation    Knee flexion    Knee extension    Ankle dorsiflexion    Ankle plantarflexion    Ankle inversion    Ankle eversion     (Blank rows = not tested)  LOWER EXTREMITY MMT:    MMT Right Eval Left Eval  Hip flexion 4+ 4+  Hip extension    Hip abduction 4+ 4+  Hip adduction 4+ 4+  Hip internal rotation    Hip external rotation    Knee flexion 4+ 4  Knee extension 4+ 4  Ankle dorsiflexion 4+ 4  Ankle plantarflexion    Ankle inversion    Ankle eversion    (Blank rows = not tested)   TRANSFERS: Assistive device utilized: None  Sit to stand: Modified independence Stand to sit: Modified independence GAIT: Gait pattern: step through pattern, occasional dyskinesias Distance walked: 40 ft x 4 Assistive device utilized: None Level of assistance: Complete  Independence   FUNCTIONAL TESTS:  5 times sit to stand: 10.28 sec Timed up and go (TUG): 8.28 sec TUG cognitive:  9.9 MiniBESTest: 20/28 (Scores >22/28 indicate increased fall risk)    TODAY'S TREATMENT:                                                                                                                               DATE: 10/10/2022    PATIENT EDUCATION: Education details: Eval results Person educated: Patient Education method: Explanation Education comprehension: verbalized understanding  HOME EXERCISE PROGRAM: Not initiated  GOALS: Goals reviewed with patient? Yes  SHORT TERM GOALS: = LTGs   LONG TERM GOALS: Target date: 11/09/2022  Pt will be independent with HEP for improved balance. Baseline:  Goal status: INITIAL  2.  Pt will improve MiniBESTest score to at least 23/28 to decrease fall risk. Baseline: 20/28 Goal status: INITIAL  3.  Pt will perform posterior push and release in posterior direction in 2 or less steps, for improved balance recovery. Baseline: multiple small steps and would fall if unaided Goal status: INITIAL   ASSESSMENT:  CLINICAL IMPRESSION: Skilled PT session focused again this visit on balance strategies for balance recovery.  Pt continues to demonstrate narrowed BOS and festinating steps at times; cues for wide BOS, stop to reset with good posture and start again with BIG steps, BIG effort.  He is challenged by dual task activity with multi-directional stepping and with resisted gait activity in forward and back direction.  PT continues to reinforce BIG effort, deliberate effort with all motions.  He will continue to benefit from skilled PT towards goals for improved balance and steadiness.  OBJECTIVE IMPAIRMENTS: Abnormal gait, decreased balance, difficulty walking, and postural dysfunction.   ACTIVITY LIMITATIONS: locomotion level  PARTICIPATION LIMITATIONS: community activity  PERSONAL FACTORS: 3+ comorbidities: See PMH above  are also affecting patient's functional outcome.   REHAB POTENTIAL: Good  CLINICAL DECISION MAKING: Evolving/moderate complexity  EVALUATION COMPLEXITY: Moderate  PLAN:  PT FREQUENCY: 2x/week  PT DURATION: 4 weeks  PLANNED INTERVENTIONS: Therapeutic exercises,  Therapeutic activity, Neuromuscular re-education, Balance training, Gait training, Patient/Family education, and Self Care  PLAN FOR NEXT SESSION: Continue to review/progress step strategies for balance; work on limits of stability, hip and ankle strategies as well.  Multi-direction stepping and resisted gait   Reva Pinkley W., PT 10/29/2022, 11:52 AM  [facility] at [facility] [place] [place], [place] [address] Phone # [phone] Fax # [phone]

## 2022-10-31 ENCOUNTER — Ambulatory Visit: Payer: Commercial Managed Care - PPO | Admitting: Physical Therapy

## 2022-11-05 ENCOUNTER — Ambulatory Visit: Payer: Commercial Managed Care - PPO | Admitting: Physical Therapy

## 2022-11-07 ENCOUNTER — Other Ambulatory Visit: Payer: Self-pay

## 2022-11-07 DIAGNOSIS — G20A1 Parkinson's disease without dyskinesia, without mention of fluctuations: Secondary | ICD-10-CM

## 2022-11-07 MED ORDER — CARBIDOPA-LEVODOPA 25-100 MG PO TABS: ORAL_TABLET | ORAL | 0 refills | Status: DC

## 2022-11-08 ENCOUNTER — Encounter: Payer: Self-pay | Admitting: Physical Therapy

## 2022-11-08 ENCOUNTER — Ambulatory Visit: Payer: Commercial Managed Care - PPO | Admitting: Physical Therapy

## 2022-11-08 DIAGNOSIS — R2681 Unsteadiness on feet: Secondary | ICD-10-CM

## 2022-11-08 DIAGNOSIS — R2689 Other abnormalities of gait and mobility: Secondary | ICD-10-CM | POA: Diagnosis not present

## 2022-11-08 NOTE — Therapy (Signed)
OUTPATIENT PHYSICAL THERAPY NEURO TREATMENT NOTE/DISCHARGE    Patient Name: Brian [name] MRN: [id number] DOB:02-26-1961, 61 y.o., male Today's Date: 11/08/2022   PCP: [name]. Copland, MD REFERRING PROVIDER: Alonza Bogus, DO  PHYSICAL THERAPY DISCHARGE SUMMARY  Visits from Start of Care: 4  Current functional level related to goals / functional outcomes: Pt has met 1 of 3 LTGs-see below   Remaining deficits: Balance, gait   Education / Equipment: Pt educated in HEP and strategies to decrease freezing/festination of gait.   Patient agrees to discharge. Patient goals were partially met. Patient is being discharged due to the patient's request.   Pt would like to stop therapy at this time in order to focus on treatments for his Hodgkin's disease.  Mady Haagensen, PT 11/08/22 11:01 AM Phone: [phone] Fax: [phone]   PT End of Session - 11/08/22 1018     Visit Number 4    Number of Visits 8    Date for PT Re-Evaluation 11/09/22    Authorization Type UMR    PT Start Time 1020    PT Stop Time 1058    PT Time Calculation (min) 38 min    Activity Tolerance Patient tolerated treatment well    Behavior During Therapy WFL for tasks assessed/performed              Past Medical History:  Diagnosis Date   Anxiety    Dysrhythmia    past hx pvc   Goals of care, counseling/discussion 09/25/2018   H/O autologous stem cell transplant ([place])    may 2014  at [facility]   History of Bell's palsy    2009  RIGHT SIDE--  HAS 80% FUNCTION / PT STATES A LITTLE ASYMETRICAL AND EFFECTS MOUTH   History of radiation therapy 10/18/17-11/05/17   sprine T1 26 Gy in 13 fractions, spine boost 10 Gy in 5 fractions   History of radiation therapy    Right hip, Rt Sclav- 07/23/22-08/07/11- Dr. Gery Pray   Hodgkin's disease, nodular sclerosis, of inguinal region/lower limb ([place]) ONOLOGIST--  DR Marin Olp AND A [facility]     SALVAGE CHEMO 2013/  AUTOLOGUS STEM CELL TRANSPLANT MAY 2014 AT [facility]    Hypertension    Mass of right chest wall 09/18/2018   Mass of right inguinal region    Neuromuscular disorder ([place])    bilateral feet neuropathy   PVC (premature ventricular contraction)    "benign"   TIA (transient ischemic attack) 02/2015   "probable TIA"   Wears contact lenses    Past Surgical History:  Procedure Laterality Date   AXILLARY LYMPH NODE BIOPSY Left 02/02/2013   Procedure: NEEDLE LOCALIZED AXILLARY LYMPH NODE BIOPSY;  Surgeon: Haywood Lasso, MD;  Location: [facility];  Service: General;  Laterality: Left;   BACK SURGERY  03/2021   BONE MARROW BIOPSY  08/26/2012   COLONOSCOPY  2021   CYST REMOVAL NECK Left 11/26/1978   IR IMAGING GUIDED PORT INSERTION  10/23/2022   LEFT INGUINAL LYMPH NODE BX  09/09/2012   LYMPH NODE BIOPSY N/A 07/28/2015   Procedure: LYMPH NODE BIOPSY;  Surgeon: Melrose Nakayama, MD;  Location: [facility];  Service: Thoracic;  Laterality: N/A;   LYMPH NODE BIOPSY Right 06/29/2022   Procedure: RIGHT AXILLARY NODE BIOPSY;  Surgeon: Melrose Nakayama, MD;  Location: [facility];  Service: Vascular;  Laterality: Right;   MASS EXCISION Right 09/19/2018   Procedure: EXCISION OF RIGHT CHEST WALL MASS ERAS PATHWAY;  Surgeon: Fanny Skates, MD;  Location: [facility];  Service: General;  Laterality: Right;   NODE DISSECTION Right 07/28/2015   Procedure: NODE DISSECTION;  Surgeon: Melrose Nakayama, MD;  Location: [facility];  Service: Thoracic;  Laterality: Right;   PLEURA BIOPSY Left 05/31/2014   REMOVAL RIGHT INGUINAL LYMPH NODES  08/16/2011   SCROTAL EXPLORATION Right 01/04/2014   Procedure: SCROTUM EXPLORATION   INGUINAL , EXCISION OF CYSTIC MASS OF RIGHT SPERMATIC CORD, WITH FROZEN SECTION;  Surgeon: Franchot Gallo, MD;  Location: [facility];  Service: Urology;  Laterality: Right;   TRANSTHORACIC ECHOCARDIOGRAM  03/18/2013   MILD LVH/  EF 55-60%   VIDEO ASSISTED THORACOSCOPY Left 05/31/2014   Procedure:  LEFT VIDEO ASSISTED THORACOSCOPY, PLEURAL BIOPSY;  Surgeon: Melrose Nakayama, MD;  Location: [place] [facility];  Service: Thoracic;  Laterality: Left;   VIDEO ASSISTED THORACOSCOPY Right 07/28/2015   Procedure: RIGHT VIDEO ASSISTED THORACOSCOPY;  Surgeon: Melrose Nakayama, MD;  Location: [facility];  Service: Thoracic;  Laterality: Right;   VIDEO ASSISTED THORACOSCOPY (VATS)/ LYMPH NODE SAMPLING Right 07/28/2015   VIDEO ASSISTED THORACOSCOPY (VATS)/WEDGE RESECTION  05/31/2014   VIDEO BRONCHOSCOPY WITH ENDOBRONCHIAL ULTRASOUND  07/28/2015   VIDEO BRONCHOSCOPY WITH ENDOBRONCHIAL ULTRASOUND N/A 07/28/2015   Procedure: VIDEO BRONCHOSCOPY WITH ENDOBRONCHIAL ULTRASOUND;  Surgeon: Melrose Nakayama, MD;  Location: [facility];  Service: Thoracic;  Laterality: N/A;   Patient Active Problem List   Diagnosis Date Noted   Primary hypertension 09/12/2022   Closed fracture of right proximal humerus 10/03/2021   Traumatic complete tear of right rotator cuff 10/03/2021   Lumbar radiculopathy 10/03/2021   Goals of care, counseling/discussion 09/25/2018   Mass of right chest wall 09/18/2018   PD (Parkinson's disease) 11/27/2016   Lymphadenopathy 07/28/2015   Mediastinal adenopathy 07/14/2015   Aphasia 03/04/2015   Slurred speech 03/04/2015   Pleural mass 05/31/2014   Abdominal pain, epigastric 02/02/2014   Weight loss 08/12/2012   H/O Bell's palsy 08/12/2012   Hodgkin's disease 08/09/2011    ONSET DATE: MD referral date 09/27/2022 (as 68-monthfollow up from discharge)  REFERRING DIAG: G20.B2 (ICD-10-CM) - Parkinson's disease with dyskinesia and fluctuating manifestations   THERAPY DIAG:  Other abnormalities of gait and mobility  Unsteadiness on feet  Rationale for Evaluation and Treatment: Rehabilitation  SUBJECTIVE:                                                                                                                                                                                              SUBJECTIVE STATEMENT: Terrible few days with my back.  Stretching helps, but it's just been bad today.  Feel  wobbly today.  No falls this past week Pt accompanied by: self  PERTINENT HISTORY: See above  PAIN:  Are you having pain? Yes: NPRS scale: 5-6/10 Pain location: hips, shoulder, back Pain description: sore all over Aggravating factors: all movements, getting up from sitting Relieving factors: tylenol, stretching (somewhat)  PRECAUTIONS: Fall and Other: hx of Hodgkins/recent radiation treatments   WEIGHT BEARING RESTRICTIONS: No  FALLS: Has patient fallen in last 6 months? No  LIVING ENVIRONMENT: Lives with: lives with their family Lives in: House/apartment Stairs: 2 Has following equipment at home: Single point cane, Environmental consultant - 2 wheeled, and Wheelchair (manual)  PLOF: Independent and planning to retire   Going to [place]: Want the pain to stop  OBJECTIVE:     TODAY'S TREATMENT: 11/08/2022 Activity Comments  Reviewed exercises as part of HEP: -forward step and weightshift -back step and weightshift -side step and weigthshift -Forward monster walks Return demo understanding with min cues  Strategy of counting steps forward direction around counter, 2 reps, cues for slowed turns   Posterior push and release:  multiple steps, but able to stop on his own   MiniBESTest:  18/28 Decreased from 20/28  TUG 11.87, 19 sec TUG cognitive        PATIENT EDUCATION: Education details: HEP review, goal progress, strategies to decrease freezing episodes Person educated: Patient Education method: Explanation and Verbal cues Education comprehension: verbalized understanding   TREATMENT: 10/29/2022 Activity Comments  Heel/toe raises x 10 reps Cues for technique  Stagger stance forward/back weightshifting Cues for technique -difficulty sequencing weightshift today.     Step strategy work: Forward and side step over obstacle  Cues for deliberate  motion-start/stop, cues for wider BOS  Multi-direction stepping for varied directions of balance challenge Cues for technique-used color dots and "clock face" cues to increase cognitive load for dual tasking.  Forward/back step and weightshift x 10 reps   Monster walk forward/back 3 reps Added green theraband, cues for wide BOS for stopping to regain balance  Resisted forward gait/then backward gait, 5 reps with min assist; then with weight removed forward/back gait with cues for BIG, deliberate effort 15# at weight rack   Access Code: [email or web address] URL: [email or web address] Date: 10/29/2022 Prepared by: [facility]  Exercises - Alternating Step forward-Balance Reaction  - 1 x daily - 5 x weekly - 1-2 sets - 10 reps - Alternating Step Backward with Support  - 1 x daily - 5 x weekly - 1-2 sets - 10 reps - Step Sideways  - 1 x daily - 5 x weekly - 1-2 sets - 10 reps - Forward Monster Walk with Resistance at Ankles and Counter Support  - 1 x daily - 7 x weekly - 1 sets - 3-5 reps   -------------------------------------------------------------------------------------- Objective Measures below are from PT evaluation:  DIAGNOSTIC FINDINGS: Awaiting PET scan (11/16), MRI for low back (12/7)   COGNITION: Overall cognitive status: Within functional limits for tasks assessed   SENSATION: Reports numbness in legs in the mornings   POSTURE: rounded shoulders, forward head, posterior pelvic tilt, and dyskinesias at time  LOWER EXTREMITY ROM:   WFL in sitting  Active  Right Eval Left Eval  Hip flexion    Hip extension    Hip abduction    Hip adduction    Hip internal rotation    Hip external rotation    Knee flexion    Knee extension    Ankle dorsiflexion  Ankle plantarflexion    Ankle inversion    Ankle eversion     (Blank rows = not tested)  LOWER EXTREMITY MMT:    MMT Right Eval Left Eval  Hip flexion 4+ 4+  Hip extension     Hip abduction 4+ 4+  Hip adduction 4+ 4+  Hip internal rotation    Hip external rotation    Knee flexion 4+ 4  Knee extension 4+ 4  Ankle dorsiflexion 4+ 4  Ankle plantarflexion    Ankle inversion    Ankle eversion    (Blank rows = not tested)   TRANSFERS: Assistive device utilized: None  Sit to stand: Modified independence Stand to sit: Modified independence GAIT: Gait pattern: step through pattern, occasional dyskinesias Distance walked: 40 ft x 4 Assistive device utilized: None Level of assistance: Complete Independence   FUNCTIONAL TESTS:  5 times sit to stand: 10.28 sec Timed up and go (TUG): 8.28 sec TUG cognitive:  9.9 MiniBESTest: 20/28 (Scores >22/28 indicate increased fall risk)    TODAY'S TREATMENT:                                                                                                                              DATE: 10/10/2022    PATIENT EDUCATION: Education details: Eval results Person educated: Patient Education method: Explanation Education comprehension: verbalized understanding  HOME EXERCISE PROGRAM: Not initiated  GOALS: Goals reviewed with patient? Yes  SHORT TERM GOALS: = LTGs   LONG TERM GOALS: Target date: 11/09/2022  Pt will be independent with HEP for improved balance. Baseline:  Goal status: GOAL MET  2.  Pt will improve MiniBESTest score to at least 23/28 to decrease fall risk. Baseline: 20/28> 18/28 11/08/2022 Goal status:GOAL NOT MET  3.  Pt will perform posterior push and release in posterior direction in 2 or less steps, for improved balance recovery. Baseline: multiple small steps and would fall if unaided Goal status: GOAL NOT MET, 11/08/2022   ASSESSMENT:  CLINICAL IMPRESSION: Assessed LTGs this visit, with pt meeting 1 of 3 LTGs.  MiniBESTest score today 18/28, decreased from 20/28 at eval.  He is reporting increased back pain today, which is limiting his mobility.  He continues to experience  freezing and festination episodes with gait.  Focused session today on review of HEP and continued education for strategies to decrease festination and freezing episodes of gait.  Pt is currently/actively undergoing treatment for return of Hodgkin's lymphoma and is wanting to hold on physical therapy/discharge at this time.  OBJECTIVE IMPAIRMENTS: Abnormal gait, decreased balance, difficulty walking, and postural dysfunction.   ACTIVITY LIMITATIONS: locomotion level  PARTICIPATION LIMITATIONS: community activity  PERSONAL FACTORS: 3+ comorbidities: See PMH above  are also affecting patient's functional outcome.   REHAB POTENTIAL: Good  CLINICAL DECISION MAKING: Evolving/moderate complexity  EVALUATION COMPLEXITY: Moderate  PLAN:  PT FREQUENCY: 2x/week  PT DURATION: 4 weeks  PLANNED INTERVENTIONS: Therapeutic exercises, Therapeutic activity, Neuromuscular re-education, Balance training, Gait  training, Patient/Family education, and Self Care  PLAN FOR NEXT SESSION: Discharge PT at this time; plan for return PT eval in 4-6 months due to progressive nature of disease.  Frazier Butt., PT 11/08/2022, 10:59 AM  [facility] at [facility], [address] [place], [place] [address] Phone # [phone] Fax # [phone]

## 2022-11-09 ENCOUNTER — Telehealth: Payer: Self-pay | Admitting: *Deleted

## 2022-11-09 ENCOUNTER — Inpatient Hospital Stay: Payer: Commercial Managed Care - PPO

## 2022-11-09 ENCOUNTER — Other Ambulatory Visit: Payer: Self-pay

## 2022-11-09 ENCOUNTER — Inpatient Hospital Stay (HOSPITAL_BASED_OUTPATIENT_CLINIC_OR_DEPARTMENT_OTHER): Payer: Commercial Managed Care - PPO | Admitting: Hematology & Oncology

## 2022-11-09 ENCOUNTER — Inpatient Hospital Stay: Payer: Commercial Managed Care - PPO | Attending: Hematology & Oncology

## 2022-11-09 ENCOUNTER — Encounter: Payer: Self-pay | Admitting: Hematology & Oncology

## 2022-11-09 VITALS — BP 126/77 | HR 92 | Resp 17

## 2022-11-09 DIAGNOSIS — Z923 Personal history of irradiation: Secondary | ICD-10-CM | POA: Diagnosis not present

## 2022-11-09 DIAGNOSIS — C8195 Hodgkin lymphoma, unspecified, lymph nodes of inguinal region and lower limb: Secondary | ICD-10-CM

## 2022-11-09 DIAGNOSIS — Z5111 Encounter for antineoplastic chemotherapy: Secondary | ICD-10-CM | POA: Insufficient documentation

## 2022-11-09 DIAGNOSIS — Z79899 Other long term (current) drug therapy: Secondary | ICD-10-CM | POA: Diagnosis not present

## 2022-11-09 DIAGNOSIS — Z9484 Stem cells transplant status: Secondary | ICD-10-CM | POA: Diagnosis not present

## 2022-11-09 DIAGNOSIS — C8198 Hodgkin lymphoma, unspecified, lymph nodes of multiple sites: Secondary | ICD-10-CM | POA: Insufficient documentation

## 2022-11-09 LAB — CMP (CANCER CENTER ONLY)
ALT: 6 U/L (ref 0–44)
AST: 18 U/L (ref 15–41)
Albumin: 4.3 g/dL (ref 3.5–5.0)
Alkaline Phosphatase: 80 U/L (ref 38–126)
Anion gap: 7 (ref 5–15)
BUN: 42 mg/dL — ABNORMAL HIGH (ref 8–23)
CO2: 27 mmol/L (ref 22–32)
Calcium: 9 mg/dL (ref 8.9–10.3)
Chloride: 107 mmol/L (ref 98–111)
Creatinine: 1.27 mg/dL — ABNORMAL HIGH (ref 0.61–1.24)
GFR, Estimated: >60 mL/min (ref >60)
Glucose, Bld: 79 mg/dL (ref 70–99)
Potassium: 4.6 mmol/L (ref 3.5–5.1)
Sodium: 141 mmol/L (ref 135–145)
Total Bilirubin: 0.6 mg/dL (ref 0.3–1.2)
Total Protein: 6.5 g/dL (ref 6.5–8.1)

## 2022-11-09 LAB — CBC WITH DIFFERENTIAL (CANCER CENTER ONLY)
Abs Immature Granulocytes: 0.01 10*3/uL (ref 0.00–0.07)
Basophils Absolute: 0.1 10*3/uL (ref 0.0–0.1)
Basophils Relative: 3 %
Eosinophils Absolute: 0.1 10*3/uL (ref 0.0–0.5)
Eosinophils Relative: 5 %
HCT: 34.7 % — ABNORMAL LOW (ref 39.0–52.0)
Hemoglobin: 11.5 g/dL — ABNORMAL LOW (ref 13.0–17.0)
Immature Granulocytes: 1 %
Lymphocytes Relative: 41 %
Lymphs Abs: 0.6 10*3/uL — ABNORMAL LOW (ref 0.7–4.0)
MCH: 32.6 pg (ref 26.0–34.0)
MCHC: 33.1 g/dL (ref 30.0–36.0)
MCV: 98.3 fL (ref 80.0–100.0)
Monocytes Absolute: 0.4 10*3/uL (ref 0.1–1.0)
Monocytes Relative: 26 %
Neutro Abs: 0.4 10*3/uL — CL (ref 1.7–7.7)
Neutrophils Relative %: 24 %
Platelet Count: 244 10*3/uL (ref 150–400)
RBC: 3.53 MIL/uL — ABNORMAL LOW (ref 4.22–5.81)
RDW: 12.9 % (ref 11.5–15.5)
WBC Count: 1.5 10*3/uL — ABNORMAL LOW (ref 4.0–10.5)
nRBC: 0 % (ref 0.0–0.2)

## 2022-11-09 LAB — LACTATE DEHYDROGENASE: LDH: 166 U/L (ref 98–192)

## 2022-11-09 MED ORDER — DOXORUBICIN HCL CHEMO IV INJECTION 2 MG/ML
25.0000 mg/m2 | Freq: Once | INTRAVENOUS | Status: AC
  Administered 2022-11-09: 50 mg via INTRAVENOUS
  Filled 2022-11-09: qty 25

## 2022-11-09 MED ORDER — SODIUM CHLORIDE 0.9 % IV SOLN
150.0000 mg | Freq: Once | INTRAVENOUS | Status: AC
  Administered 2022-11-09: 150 mg via INTRAVENOUS
  Filled 2022-11-09: qty 150

## 2022-11-09 MED ORDER — SODIUM CHLORIDE 0.9 % IV SOLN
10.0000 mg | Freq: Once | INTRAVENOUS | Status: AC
  Administered 2022-11-09: 10 mg via INTRAVENOUS
  Filled 2022-11-09: qty 10

## 2022-11-09 MED ORDER — CIPROFLOXACIN HCL 500 MG PO TABS: 500.0000 mg | ORAL_TABLET | Freq: Every day | ORAL | 4 refills | Status: DC

## 2022-11-09 MED ORDER — SODIUM CHLORIDE 0.9 % IV SOLN
240.0000 mg | Freq: Once | INTRAVENOUS | Status: AC
  Administered 2022-11-09: 240 mg via INTRAVENOUS
  Filled 2022-11-09: qty 24

## 2022-11-09 MED ORDER — LACTATED RINGERS IV SOLN
250.0000 mg/m2 | Freq: Once | INTRAVENOUS | Status: AC
  Administered 2022-11-09: 500 mg via INTRAVENOUS
  Filled 2022-11-09: qty 50

## 2022-11-09 MED ORDER — HEPARIN SOD (PORK) LOCK FLUSH 100 UNIT/ML IV SOLN
500.0000 [IU] | Freq: Once | INTRAVENOUS | Status: AC | PRN
  Administered 2022-11-09: 500 [IU]

## 2022-11-09 MED ORDER — VINBLASTINE SULFATE CHEMO INJECTION 1 MG/ML
6.0000 mg/m2 | Freq: Once | INTRAVENOUS | Status: AC
  Administered 2022-11-09: 11.9 mg via INTRAVENOUS
  Filled 2022-11-09: qty 11.9

## 2022-11-09 MED ORDER — SODIUM CHLORIDE 0.9% FLUSH
10.0000 mL | INTRAVENOUS | Status: DC | PRN
  Administered 2022-11-09: 10 mL

## 2022-11-09 MED ORDER — SODIUM CHLORIDE 0.9 % IV SOLN: Freq: Once | INTRAVENOUS | Status: AC

## 2022-11-09 MED ORDER — SODIUM CHLORIDE 0.9 % IV SOLN
375.0000 mg/m2 | Freq: Once | INTRAVENOUS | Status: AC
  Administered 2022-11-09: 750 mg via INTRAVENOUS
  Filled 2022-11-09: qty 75

## 2022-11-09 MED ORDER — PALONOSETRON HCL INJECTION 0.25 MG/5ML
0.2500 mg | Freq: Once | INTRAVENOUS | Status: AC
  Administered 2022-11-09: 0.25 mg via INTRAVENOUS
  Filled 2022-11-09: qty 5

## 2022-11-09 NOTE — Patient Instructions (Signed)
[facility] [place] [place]  Discharge Instructions: Thank you for choosing [facility] to provide your oncology and hematology care.   If you have a lab appointment with the [facility], please go directly to the [facility] and check in at the registration area.  Wear comfortable clothing and clothing appropriate for easy access to any Portacath or PICC line.   We strive to give you quality time with your provider. You may need to reschedule your appointment if you arrive late (15 or more minutes).  Arriving late affects you and other patients whose appointments are after yours.  Also, if you miss three or more appointments without notifying the office, you may be dismissed from the clinic at the provider's discretion.      For prescription refill requests, have your pharmacy contact our office and allow 72 hours for refills to be completed.    Today you received the following chemotherapy and/or immunotherapy agents Adriamycin, Dacarbazine, Velban, Opdivo      To help prevent nausea and vomiting after your treatment, we encourage you to take your nausea medication as directed.  Beginning 10/26/2022 Scheduled 1) Take Dexamethasone 2 tablets in the morning for 3 days.  AS NEEDED 1) At any time, take Compazine (Prochlorperazine) 10 mg as needed every 6 hours for nausea, 2) Beginning Saturday, 10/27/2022 if you have nausea, take Ondansetron (Zofran) for nausea or vomiting.   BELOW ARE SYMPTOMS THAT SHOULD BE REPORTED IMMEDIATELY: *FEVER GREATER THAN 100.4 F (38 C) OR HIGHER *CHILLS OR SWEATING *NAUSEA AND VOMITING THAT IS NOT CONTROLLED WITH YOUR NAUSEA MEDICATION *UNUSUAL SHORTNESS OF BREATH *UNUSUAL BRUISING OR BLEEDING *URINARY PROBLEMS (pain or burning when urinating, or frequent urination) *BOWEL PROBLEMS (unusual diarrhea, constipation, pain near the anus) TENDERNESS IN MOUTH AND THROAT WITH OR WITHOUT PRESENCE OF ULCERS (sore throat, sores in mouth,  or a toothache) UNUSUAL RASH, SWELLING OR PAIN  UNUSUAL VAGINAL DISCHARGE OR ITCHING   Items with * indicate a potential emergency and should be followed up as soon as possible or go to the Emergency Department if any problems should occur.  Please show the CHEMOTHERAPY ALERT CARD or IMMUNOTHERAPY ALERT CARD at check-in to the Emergency Department and triage nurse. Should you have questions after your visit or need to cancel or reschedule your appointment, please contact [facility]  [phone] and follow the prompts.  Office hours are 8:00 a.m. to 4:30 p.m. Monday - Friday. Please note that voicemails left after 4:00 p.m. may not be returned until the following business day.  We are closed weekends and major holidays. You have access to a nurse at all times for urgent questions. Please call the main number to the clinic [phone] and follow the prompts.  For any non-urgent questions, you may also contact your provider using MyChart. We now offer e-Visits for anyone 81 and older to request care online for non-urgent symptoms. For details visit [email or web address].   Also download the MyChart app! Go to the app store, search "MyChart", open the app, select [facility], and log in with your MyChart username and password.  Masks are optional in the cancer centers. If you would like for your care team to wear a mask while they are taking care of you, please let them know. You may have one support person who is at least 62 years old accompany you for your appointments.

## 2022-11-09 NOTE — Telephone Encounter (Signed)
Ulice Dash from lab notified me of a panic ANC of 0.4. MD notified.

## 2022-11-09 NOTE — Progress Notes (Signed)
Hematology and Oncology Follow Up Visit  Brian [name] [id number] January 28, 1961 61 y.o. 11/09/2022   Principle Diagnosis:  Recurrent Hodgkin's Disease -  S/p CAR-T therapy -- relapsed TIA-resolved  Current Therapy:  ANVD -- start cycle #1 on 10/25/2022 Nivolumab q 3 month -- s/p cycle #18 -- on hold XRT -- [facility] field --completed on 08/06/2022  Neulasta 6 mg IM post each chemotherapy       Interim History:  [name] is in for follow-up.  He is doing okay.  He had no problems with the first cycle of treatment.  He still has not lost his hair.  His white cell count is on the low side.  However, the monocytes are on the high side.  As such, I think his neutrophils are on the way back up.  We will have to give him Neulasta with each treatment.  I will go ahead and put him on some ciprofloxacin after treatment today.  I really think that we have to treat him.  Again his monocytes are on the high side.  I have to believe that the white cells will come up quickly.  He has had no problems with bowels or bladder.  There is been no worsening of his Parkinson's.  He has had no cough or shortness of breath.  He has had no mouth sores.  There is been no leg swelling.  He is eating well.  His girlfriend comes in with him.  She is incredibly dedicated to him.  She really make sure that he eats well and gets exercise.  He has had no bleeding.  There is been no fever.  Overall, I would say that his performance status is ECOG 1.    Medications:  Current Outpatient Medications:    acetaminophen (TYLENOL) 500 MG tablet, Take 1,000 mg by mouth every 8 (eight) hours as needed for moderate pain., Disp: , Rfl:    carbidopa-levodopa (SINEMET CR) 50-200 MG tablet, TAKE 1 TABLET BY MOUTH AT BEDTIME, Disp: 90 tablet, Rfl: 0   carbidopa-levodopa (SINEMET IR) 25-100 MG tablet, TAKE 2 TABLETS AT 7AM, 11AM, 3PM, AND AT 7PM DAILY. (Patient taking differently: TAKE 2 TABLETS AT 7AM, 11AM, 3PM.), Disp: 720  tablet, Rfl: 0   cholecalciferol (VITAMIN D3) 25 MCG (1000 UNIT) tablet, Take 1,000 Units by mouth daily., Disp: , Rfl:    dexamethasone (DECADRON) 4 MG tablet, Take 2 tablets (8 mg) by mouth daily for 3 days after chemotherapy. Take with food., Disp: 30 tablet, Rfl: 1   ibuprofen (ADVIL) 200 MG tablet, Take 200 mg by mouth every 6 (six) hours as needed for moderate pain., Disp: , Rfl:    olmesartan (BENICAR) 20 MG tablet, Take 0.5 tablets (10 mg total) by mouth daily., Disp: 45 tablet, Rfl: 3   pramipexole (MIRAPEX) 0.5 MG tablet, TAKE 1 TABLET(0.5 MG) BY MOUTH THREE TIMES DAILY, Disp: 270 tablet, Rfl: 1   traMADol (ULTRAM) 50 MG tablet, Take 1 tablet (50 mg total) by mouth every 8 (eight) hours as needed., Disp: 60 tablet, Rfl: 0   ondansetron (ZOFRAN) 8 MG tablet, Take 1 tablet (8 mg total) by mouth every 8 (eight) hours as needed for nausea or vomiting. Start on the third day after chemotherapy. (Patient not taking: Reported on 11/09/2022), Disp: 30 tablet, Rfl: 1   prochlorperazine (COMPAZINE) 10 MG tablet, Take 1 tablet (10 mg total) by mouth every 6 (six) hours as needed for nausea or vomiting. (Patient not taking: Reported on 11/09/2022), Disp:  30 tablet, Rfl: 1  Allergies: No Known Allergies  Past Medical History, Surgical history, Social history, and Family History were reviewed and updated.  Review of Systems: Review of Systems  Neurological:  Positive for tremors.  All other systems reviewed and are negative.    Physical Exam:  height is '5\' 10"'$  (1.778 m) and weight is 177 lb (80.3 kg). His oral temperature is 97.8 F (36.6 C). His blood pressure is 90/60 and his pulse is 87. His respiration is 20 and oxygen saturation is 100%.   Physical Exam Vitals reviewed.  HENT:     Head: Normocephalic and atraumatic.  Eyes:     Pupils: Pupils are equal, round, and reactive to light.  Cardiovascular:     Rate and Rhythm: Normal rate and regular rhythm.     Heart sounds: Normal heart  sounds.  Pulmonary:     Effort: Pulmonary effort is normal.     Breath sounds: Normal breath sounds.  Abdominal:     General: Bowel sounds are normal.     Palpations: Abdomen is soft.  Musculoskeletal:        General: No tenderness or deformity. Normal range of motion.     Cervical back: Normal range of motion.  Lymphadenopathy:     Cervical: No cervical adenopathy.  Skin:    General: Skin is warm and dry.     Findings: No erythema or rash.  Neurological:     Mental Status: He is alert and oriented to person, place, and time.  Psychiatric:        Behavior: Behavior normal.        Thought Content: Thought content normal.        Judgment: Judgment normal.    Lab Results  Component Value Date   WBC 1.5 (L) 11/09/2022   HGB 11.5 (L) 11/09/2022   HCT 34.7 (L) 11/09/2022   MCV 98.3 11/09/2022   PLT 244 11/09/2022     Chemistry      Component Value Date/Time   NA 141 11/09/2022 0915   NA 142 04/02/2017 0000   NA 140 07/19/2016 1305   K 4.6 11/09/2022 0915   K 4.4 07/19/2016 1305   CL 107 11/09/2022 0915   CL 105 02/08/2016 1117   CL 107 03/25/2013 0828   CO2 27 11/09/2022 0915   CO2 26 07/19/2016 1305   BUN 42 (H) 11/09/2022 0915   BUN 21 04/02/2017 0000   BUN 22.8 07/19/2016 1305   CREATININE 1.27 (H) 11/09/2022 0915   CREATININE 1.1 07/19/2016 1305   GLU 105 04/02/2017 0000      Component Value Date/Time   CALCIUM 9.0 11/09/2022 0915   CALCIUM 9.8 07/19/2016 1305   ALKPHOS 80 11/09/2022 0915   ALKPHOS 76 07/19/2016 1305   AST 18 11/09/2022 0915   AST 18 07/19/2016 1305   ALT 6 11/09/2022 0915   ALT 14 07/19/2016 1305   BILITOT 0.6 11/09/2022 0915   BILITOT 0.70 07/19/2016 1305      Impression and Plan: Brian [name] is a 61 year old gentleman.  He has history of recurrent Hodgkin's disease. 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 He then had a recurrence after this.  He did undergo CAR[facility] therapy at [facility].  I believe he had  this in April 2017.  He did well with this.  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  This was proven by biopsy.  He had radiation therapy for this.  He subsequently has had another recurrence.  This was in the right axilla.  This was biopsy-proven.  He did undergo radiation therapy for this.  This was completed in September/2023.  Now, we have another recurrence.  I really should not be surprised by this.  He has had his first day of chemotherapy.  He will get day 15 of cycle 1 today.  I will plan for a another PET scan after his second cycle of chemotherapy.  Again, we will put him on Neulasta.  I think this is going to be helpful so we keep him on schedule.  He will get his ciprofloxacin today.  He will take this for 10 days.  I will plan to see him back in 2 weeks.  We have to make sure that we stay in close contact with him.        [name], MD 12/15/202310:03 AM

## 2022-11-09 NOTE — Patient Instructions (Signed)

## 2022-11-09 NOTE — Progress Notes (Signed)
Reviewed pt labs with Dr. Marin Olp and ok to treat with ANC 0.4  Reviewed neutropenic precautions with patient including wearing a mask, avoiding crowds, good handwashing and fever guidelines. Pt verbalized understanding and had no further questions.

## 2022-11-12 ENCOUNTER — Inpatient Hospital Stay: Payer: Commercial Managed Care - PPO

## 2022-11-12 VITALS — BP 149/92 | HR 89 | Temp 98.1°F | Resp 17 | Ht 70.0 in | Wt 177.0 lb

## 2022-11-12 DIAGNOSIS — Z5111 Encounter for antineoplastic chemotherapy: Secondary | ICD-10-CM | POA: Diagnosis not present

## 2022-11-12 DIAGNOSIS — C8195 Hodgkin lymphoma, unspecified, lymph nodes of inguinal region and lower limb: Secondary | ICD-10-CM

## 2022-11-12 MED ORDER — PEGFILGRASTIM-CBQV 6 MG/0.6ML ~~LOC~~ SOSY
6.0000 mg | PREFILLED_SYRINGE | Freq: Once | SUBCUTANEOUS | Status: AC
  Administered 2022-11-12: 6 mg via SUBCUTANEOUS
  Filled 2022-11-12: qty 0.6

## 2022-11-12 NOTE — Patient Instructions (Addendum)
[facility]  Discharge Instructions: Thank you for choosing [facility] to provide your oncology and hematology care.   If you have a lab appointment with the [facility], please go directly to the [facility] and check in at the registration area.  Wear comfortable clothing and clothing appropriate for easy access to any Portacath or PICC line.   We strive to give you quality time with your provider. You may need to reschedule your appointment if you arrive late (15 or more minutes).  Arriving late affects you and other patients whose appointments are after yours.  Also, if you miss three or more appointments without notifying the office, you may be dismissed from the clinic at the provider's discretion.      For prescription refill requests, have your pharmacy contact our office and allow 72 hours for refills to be completed.    Today you received the following chemotherapy and/or immunotherapy agents udenyca     To help prevent nausea and vomiting after your treatment, we encourage you to take your nausea medication as directed.  BELOW ARE SYMPTOMS THAT SHOULD BE REPORTED IMMEDIATELY: *FEVER GREATER THAN 100.4 F (38 C) OR HIGHER *CHILLS OR SWEATING *NAUSEA AND VOMITING THAT IS NOT CONTROLLED WITH YOUR NAUSEA MEDICATION *UNUSUAL SHORTNESS OF BREATH *UNUSUAL BRUISING OR BLEEDING *URINARY PROBLEMS (pain or burning when urinating, or frequent urination) *BOWEL PROBLEMS (unusual diarrhea, constipation, pain near the anus) TENDERNESS IN MOUTH AND THROAT WITH OR WITHOUT PRESENCE OF ULCERS (sore throat, sores in mouth, or a toothache) UNUSUAL RASH, SWELLING OR PAIN  UNUSUAL VAGINAL DISCHARGE OR ITCHING   Items with * indicate a potential emergency and should be followed up as soon as possible or go to the Emergency Department if any problems should occur.  Please show the CHEMOTHERAPY ALERT CARD or IMMUNOTHERAPY ALERT CARD at check-in to the  Emergency Department and triage nurse. Should you have questions after your visit or need to cancel or reschedule your appointment, please contact [facility]  [phone] and follow the prompts.  Office hours are 8:00 a.m. to 4:30 p.m. Monday - Friday. Please note that voicemails left after 4:00 p.m. may not be returned until the following business day.  We are closed weekends and major holidays. You have access to a nurse at all times for urgent questions. Please call the main number to the clinic [phone] and follow the prompts.  For any non-urgent questions, you may also contact your provider using MyChart. We now offer e-Visits for anyone 34 and older to request care online for non-urgent symptoms. For details visit [email or web address].   Also download the MyChart app! Go to the app store, search "MyChart", open the app, select [facility], and log in with your MyChart username and password.  Masks are optional in the cancer centers. If you would like for your care team to wear a mask while they are taking care of you, please let them know. You may have one support person who is at least 62 years old accompany you for your appointments.

## 2022-11-13 ENCOUNTER — Other Ambulatory Visit: Payer: Self-pay | Admitting: Neurology

## 2022-11-13 DIAGNOSIS — G20A1 Parkinson's disease without dyskinesia, without mention of fluctuations: Secondary | ICD-10-CM

## 2022-11-23 ENCOUNTER — Inpatient Hospital Stay (HOSPITAL_BASED_OUTPATIENT_CLINIC_OR_DEPARTMENT_OTHER): Payer: Commercial Managed Care - PPO | Admitting: Hematology & Oncology

## 2022-11-23 ENCOUNTER — Inpatient Hospital Stay: Payer: Commercial Managed Care - PPO

## 2022-11-23 ENCOUNTER — Encounter: Payer: Self-pay | Admitting: Hematology & Oncology

## 2022-11-23 VITALS — BP 82/53 | HR 103

## 2022-11-23 VITALS — BP 120/83 | HR 82 | Temp 97.6°F | Resp 17 | Ht 70.0 in | Wt 175.0 lb

## 2022-11-23 DIAGNOSIS — C8195 Hodgkin lymphoma, unspecified, lymph nodes of inguinal region and lower limb: Secondary | ICD-10-CM

## 2022-11-23 DIAGNOSIS — Z5111 Encounter for antineoplastic chemotherapy: Secondary | ICD-10-CM | POA: Diagnosis not present

## 2022-11-23 LAB — CBC WITH DIFFERENTIAL (CANCER CENTER ONLY)
Abs Immature Granulocytes: 1.26 10*3/uL — ABNORMAL HIGH (ref 0.00–0.07)
Basophils Absolute: 0.1 10*3/uL (ref 0.0–0.1)
Basophils Relative: 1 %
Eosinophils Absolute: 0 10*3/uL (ref 0.0–0.5)
Eosinophils Relative: 0 %
HCT: 35.3 % — ABNORMAL LOW (ref 39.0–52.0)
Hemoglobin: 11.3 g/dL — ABNORMAL LOW (ref 13.0–17.0)
Immature Granulocytes: 12 %
Lymphocytes Relative: 8 %
Lymphs Abs: 0.9 10*3/uL (ref 0.7–4.0)
MCH: 31.9 pg (ref 26.0–34.0)
MCHC: 32 g/dL (ref 30.0–36.0)
MCV: 99.7 fL (ref 80.0–100.0)
Monocytes Absolute: 1.5 10*3/uL — ABNORMAL HIGH (ref 0.1–1.0)
Monocytes Relative: 14 %
Neutro Abs: 6.9 10*3/uL (ref 1.7–7.7)
Neutrophils Relative %: 65 %
Platelet Count: 193 10*3/uL (ref 150–400)
RBC: 3.54 MIL/uL — ABNORMAL LOW (ref 4.22–5.81)
RDW: 13.6 % (ref 11.5–15.5)
Smear Review: NORMAL
WBC Count: 10.6 10*3/uL — ABNORMAL HIGH (ref 4.0–10.5)
nRBC: 0.6 % — ABNORMAL HIGH (ref 0.0–0.2)

## 2022-11-23 LAB — CMP (CANCER CENTER ONLY)
ALT: 5 U/L (ref 0–44)
AST: 19 U/L (ref 15–41)
Albumin: 4.4 g/dL (ref 3.5–5.0)
Alkaline Phosphatase: 115 U/L (ref 38–126)
Anion gap: 9 (ref 5–15)
BUN: 39 mg/dL — ABNORMAL HIGH (ref 8–23)
CO2: 25 mmol/L (ref 22–32)
Calcium: 9.1 mg/dL (ref 8.9–10.3)
Chloride: 104 mmol/L (ref 98–111)
Creatinine: 1.46 mg/dL — ABNORMAL HIGH (ref 0.61–1.24)
GFR, Estimated: 54 mL/min — ABNORMAL LOW (ref >60)
Glucose, Bld: 99 mg/dL (ref 70–99)
Potassium: 4.5 mmol/L (ref 3.5–5.1)
Sodium: 138 mmol/L (ref 135–145)
Total Bilirubin: 0.4 mg/dL (ref 0.3–1.2)
Total Protein: 6.5 g/dL (ref 6.5–8.1)

## 2022-11-23 LAB — LACTATE DEHYDROGENASE: LDH: 313 U/L — ABNORMAL HIGH (ref 98–192)

## 2022-11-23 MED ORDER — PALONOSETRON HCL INJECTION 0.25 MG/5ML
0.2500 mg | Freq: Once | INTRAVENOUS | Status: AC
  Administered 2022-11-23: 0.25 mg via INTRAVENOUS
  Filled 2022-11-23: qty 5

## 2022-11-23 MED ORDER — SODIUM CHLORIDE 0.9 % IV SOLN
150.0000 mg | Freq: Once | INTRAVENOUS | Status: AC
  Administered 2022-11-23: 150 mg via INTRAVENOUS
  Filled 2022-11-23: qty 150

## 2022-11-23 MED ORDER — SODIUM CHLORIDE 0.9 % IV SOLN: Freq: Once | INTRAVENOUS | Status: AC

## 2022-11-23 MED ORDER — SODIUM CHLORIDE 0.9% FLUSH
10.0000 mL | INTRAVENOUS | Status: DC | PRN
  Administered 2022-11-23: 10 mL

## 2022-11-23 MED ORDER — HEPARIN SOD (PORK) LOCK FLUSH 100 UNIT/ML IV SOLN
500.0000 [IU] | Freq: Once | INTRAVENOUS | Status: AC | PRN
  Administered 2022-11-23: 500 [IU]

## 2022-11-23 MED ORDER — SODIUM CHLORIDE 0.9 % IV SOLN
375.0000 mg/m2 | Freq: Once | INTRAVENOUS | Status: AC
  Administered 2022-11-23: 750 mg via INTRAVENOUS
  Filled 2022-11-23: qty 75

## 2022-11-23 MED ORDER — VINBLASTINE SULFATE CHEMO INJECTION 1 MG/ML
6.0000 mg/m2 | Freq: Once | INTRAVENOUS | Status: AC
  Administered 2022-11-23: 11.9 mg via INTRAVENOUS
  Filled 2022-11-23: qty 11.9

## 2022-11-23 MED ORDER — LACTATED RINGERS IV SOLN
250.0000 mg/m2 | Freq: Once | INTRAVENOUS | Status: AC
  Administered 2022-11-23: 500 mg via INTRAVENOUS
  Filled 2022-11-23: qty 50

## 2022-11-23 MED ORDER — SODIUM CHLORIDE 0.9 % IV SOLN
240.0000 mg | Freq: Once | INTRAVENOUS | Status: AC
  Administered 2022-11-23: 240 mg via INTRAVENOUS
  Filled 2022-11-23: qty 24

## 2022-11-23 MED ORDER — SODIUM CHLORIDE 0.9 % IV SOLN
10.0000 mg | Freq: Once | INTRAVENOUS | Status: AC
  Administered 2022-11-23: 10 mg via INTRAVENOUS
  Filled 2022-11-23: qty 10

## 2022-11-23 MED ORDER — DOXORUBICIN HCL CHEMO IV INJECTION 2 MG/ML
25.0000 mg/m2 | Freq: Once | INTRAVENOUS | Status: AC
  Administered 2022-11-23: 50 mg via INTRAVENOUS
  Filled 2022-11-23: qty 25

## 2022-11-23 NOTE — Progress Notes (Signed)
Pt BP slightly lower than normal. Pt having difficulty walking after treatment. Pt denied feeling dizzy or light headed. Pt states he's having trouble walking due to cramp in leg and parkinson's. RN requested pt to be transported by wheelchair for safety. Pt agreed.

## 2022-11-23 NOTE — Progress Notes (Signed)
Hematology and Oncology Follow Up Visit  Brian [name] [id number] [birth year] 61 y.o. 11/23/2022   Principle Diagnosis:  Recurrent Hodgkin's Disease -  S/p CAR-T therapy -- relapsed TIA-resolved  Current Therapy:  ANVD -- s/p cycle #1 -- start on 10/25/2022 Nivolumab q 3 month -- s/p cycle #18 -- on hold XRT -- [facility] --completed on 08/06/2022  Neulasta 6 mg IM post each chemotherapy       Interim History:  Brian [name] is in for follow-up.  Overall, he tolerated his first cycle of treatment well.  He is trying to exercise.  He has a Parkinson's that has been a problem.  He is on medication for this.  He has had no problems with falling.  He had a nice Christmas.  He is eating well.  He has had no nausea or vomiting.  We do have him on Neulasta now.  This really has helped his white blood cell count this cycle.  He has had no change in bowel or bladder habits.  He has had no headache.  He has had no mouth sores.  He has had no bleeding.  There is been no rashes.  Overall, I would say his performance status is probably ECOG 1.     Medications:  Current Outpatient Medications:    acetaminophen (TYLENOL) 500 MG tablet, Take 1,000 mg by mouth every 8 (eight) hours as needed for moderate pain., Disp: , Rfl:    carbidopa-levodopa (SINEMET CR) 50-200 MG tablet, TAKE 1 TABLET BY MOUTH AT BEDTIME, Disp: 90 tablet, Rfl: 0   carbidopa-levodopa (SINEMET IR) 25-100 MG tablet, TAKE 2 TABLETS AT 7AM, 11AM, 3PM, AND AT 7PM DAILY. (Patient taking differently: TAKE 2 TABLETS AT 7AM, 11AM, 3PM.), Disp: 720 tablet, Rfl: 0   cholecalciferol (VITAMIN D3) 25 MCG (1000 UNIT) tablet, Take 1,000 Units by mouth daily., Disp: , Rfl:    ciprofloxacin (CIPRO) 500 MG tablet, Take 1 tablet (500 mg total) by mouth daily with breakfast., Disp: 10 tablet, Rfl: 4   dexamethasone (DECADRON) 4 MG tablet, Take 2 tablets (8 mg) by mouth daily for 3 days after chemotherapy. Take with food., Disp: 30 tablet, Rfl: 1    pramipexole (MIRAPEX) 0.5 MG tablet, TAKE 1 TABLET(0.5 MG) BY MOUTH THREE TIMES DAILY, Disp: 270 tablet, Rfl: 1   traMADol (ULTRAM) 50 MG tablet, Take 1 tablet (50 mg total) by mouth every 8 (eight) hours as needed., Disp: 60 tablet, Rfl: 0   ibuprofen (ADVIL) 200 MG tablet, Take 200 mg by mouth every 6 (six) hours as needed for moderate pain. (Patient not taking: Reported on 11/23/2022), Disp: , Rfl:    olmesartan (BENICAR) 20 MG tablet, Take 0.5 tablets (10 mg total) by mouth daily., Disp: 45 tablet, Rfl: 3   ondansetron (ZOFRAN) 8 MG tablet, Take 1 tablet (8 mg total) by mouth every 8 (eight) hours as needed for nausea or vomiting. Start on the third day after chemotherapy. (Patient not taking: Reported on 11/09/2022), Disp: 30 tablet, Rfl: 1   prochlorperazine (COMPAZINE) 10 MG tablet, Take 1 tablet (10 mg total) by mouth every 6 (six) hours as needed for nausea or vomiting. (Patient not taking: Reported on 11/09/2022), Disp: 30 tablet, Rfl: 1  Allergies: No Known Allergies  Past Medical History, Surgical history, Social history, and Family History were reviewed and updated.  Review of Systems: Review of Systems  Neurological:  Positive for tremors.  All other systems reviewed and are negative.    Physical Exam:  height is '5\' 10"'$  (1.778 m) and weight is 175 lb (79.4 kg). His oral temperature is 97.6 F (36.4 C). His blood pressure is 120/83 and his pulse is 82. His respiration is 17 and oxygen saturation is 97%.   Physical Exam Vitals reviewed.  HENT:     Head: Normocephalic and atraumatic.  Eyes:     Pupils: Pupils are equal, round, and reactive to light.  Cardiovascular:     Rate and Rhythm: Normal rate and regular rhythm.     Heart sounds: Normal heart sounds.  Pulmonary:     Effort: Pulmonary effort is normal.     Breath sounds: Normal breath sounds.  Abdominal:     General: Bowel sounds are normal.     Palpations: Abdomen is soft.  Musculoskeletal:        General: No  tenderness or deformity. Normal range of motion.     Cervical back: Normal range of motion.  Lymphadenopathy:     Cervical: No cervical adenopathy.  Skin:    General: Skin is warm and dry.     Findings: No erythema or rash.  Neurological:     Mental Status: He is alert and oriented to person, place, and time.  Psychiatric:        Behavior: Behavior normal.        Thought Content: Thought content normal.        Judgment: Judgment normal.    Lab Results  Component Value Date   WBC 10.6 (H) 11/23/2022   HGB 11.3 (L) 11/23/2022   HCT 35.3 (L) 11/23/2022   MCV 99.7 11/23/2022   PLT 193 11/23/2022     Chemistry      Component Value Date/Time   NA 138 11/23/2022 0905   NA 142 04/02/2017 0000   NA 140 07/19/2016 1305   K 4.5 11/23/2022 0905   K 4.4 07/19/2016 1305   CL 104 11/23/2022 0905   CL 105 02/08/2016 1117   CL 107 03/25/2013 0828   CO2 25 11/23/2022 0905   CO2 26 07/19/2016 1305   BUN 39 (H) 11/23/2022 0905   BUN 21 04/02/2017 0000   BUN 22.8 07/19/2016 1305   CREATININE 1.46 (H) 11/23/2022 0905   CREATININE 1.1 07/19/2016 1305   GLU 105 04/02/2017 0000      Component Value Date/Time   CALCIUM 9.1 11/23/2022 0905   CALCIUM 9.8 07/19/2016 1305   ALKPHOS 115 11/23/2022 0905   ALKPHOS 76 07/19/2016 1305   AST 19 11/23/2022 0905   AST 18 07/19/2016 1305   ALT 5 11/23/2022 0905   ALT 14 07/19/2016 1305   BILITOT 0.4 11/23/2022 0905   BILITOT 0.70 07/19/2016 1305      Impression and Plan: Brian [name] is a 61 year old gentleman.  He has history of recurrent Hodgkin's disease. 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 He then had a recurrence after this.  He did undergo CAR[facility]T therapy at [facility].  I believe he had this in April 2017.  He did well with this.  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  This was proven by biopsy.  He had radiation therapy for this.  He subsequently has had another recurrence.  This was in the  right axilla.  This was biopsy-proven.  He did undergo radiation therapy for this.  This was completed in September/2023.  Now, we have another recurrence.  I really should not be surprised by this.  We will go with  his second cycle of ANVD.  He is doing well with this.  His blood counts look great.  After his second cycle, we will then plan for her scan.  We will plan to get her back in another couple weeks.  He will get his Neulasta on 11/27/2022.       [name], MD 12/29/20239:39 AM

## 2022-11-23 NOTE — Progress Notes (Signed)
Labs reviewed with MD, ok to treat despite counts. 

## 2022-11-23 NOTE — Patient Instructions (Signed)

## 2022-11-23 NOTE — Patient Instructions (Addendum)
Dacarbazine Injection What is this medication? DACARBAZINE (da KAR ba zeen) treats skin cancer and lymphoma. It works by slowing down the growth of cancer cells. This medicine may be used for other purposes; ask your health care provider or pharmacist if you have questions. COMMON BRAND NAME(S): DTIC-Dome What should I tell my care team before I take this medication? They need to know if you have any of these conditions: Infection, such as chickenpox, cold sores, herpes Kidney disease Liver disease Low blood cell levels, such as low white cells, platelets, or red blood cells Recent radiation therapy An unusual or allergic reaction to dacarbazine, other medications, foods, dyes, or preservatives Pregnant or trying to get pregnant Breast-feeding How should I use this medication? This medication is given as an injected into a vein. It is given by your care team in a hospital or clinic setting. Talk to your care team about the use of this medication in children. While it may be prescribed for selected conditions, precautions do apply. Overdosage: If you think you have taken too much of this medicine contact a poison control center or emergency room at once. NOTE: This medicine is only for you. Do not share this medicine with others. What if I miss a dose? Keep appointments for follow-up doses. It is important not to miss your dose. Call your care team if you are unable to keep an appointment. What may interact with this medication? Do not take this medication with any of the following: Live virus vaccines This medication may also interact with the following: Medications to increase blood counts, such as filgrastim, pegfilgrastim, sargramostim This list may not describe all possible interactions. Give your health care provider a list of all the medicines, herbs, non-prescription drugs, or dietary supplements you use. Also tell them if you smoke, drink alcohol, or use illegal drugs. Some items  may interact with your medicine. What should I watch for while using this medication? Your condition will be monitored carefully while you are receiving this medication. You may need blood work done while taking this medication. This medication may make you feel generally unwell. This is not uncommon as chemotherapy can affect healthy cells as well as cancer cells. Report any side effects. Continue your course of treatment even though you feel ill unless your care team tells you to stop. This medication may increase your risk of getting an infection. Call your care team for advice if you get a fever, chills, sore throat, or other symptoms of a cold or flu. Do not treat yourself. Try to avoid being around people who are sick. This medication may increase your risk to bruise or bleed. Call your care team if you notice any unusual bleeding. Talk to your care team about your risk of cancer. You may be more at risk for certain types of cancers if you take this medication. Talk to your care team if you wish to become pregnant or think you might be pregnant. This medication can cause serious birth defects if taken during pregnancy. A reliable form of contraception is recommended while taking this medication. Talk to your care team about effective forms of contraception. Do not breastfeed while taking this medication. What side effects may I notice from receiving this medication? Side effects that you should report to your care team as soon as possible: Allergic reactions--skin rash, itching, hives, swelling of the face, lips, tongue, or throat Infection--fever, chills, cough, sore throat, wounds that don't heal, pain or trouble when passing urine, general  feeling of discomfort or being unwell Liver injury--right upper belly pain, loss of appetite, nausea, light-colored stool, dark yellow or brown urine, yellowing skin or eyes, unusual weakness or fatigue Low red blood cell level--unusual weakness or fatigue,  dizziness, headache, trouble breathing Painful swelling, warmth, or redness of the skin, blisters or sores at the infusion site Unusual bruising or bleeding Side effects that usually do not require medical attention (report to your care team if they continue or are bothersome): Hair loss Loss of appetite Nausea Vomiting This list may not describe all possible side effects. Call your doctor for medical advice about side effects. You may report side effects to FDA at 1-800-FDA-1088. Where should I keep my medication? This medication is given in a hospital or clinic. It will not be stored at home. NOTE: This sheet is a summary. It may not cover all possible information. If you have questions about this medicine, talk to your doctor, pharmacist, or health care provider.  2023 Elsevier/Gold Standard (2022-03-09 00:00:00)  Vinblastine Injection What is this medication? VINBLASTINE (vin BLAS teen) treats some types of cancer. It works by slowing down the growth of cancer cells. This medicine may be used for other purposes; ask your health care provider or pharmacist if you have questions. COMMON BRAND NAME(S): Velban What should I tell my care team before I take this medication? They need to know if you have any of these conditions: Heart disease Infection Liver disease Low white blood cell levels Lung disease An unusual or allergic reaction to vinblastine, other chemotherapy agents, other medications, foods, dyes, or preservatives Pregnant or trying to get pregnant Breast-feeding How should I use this medication? This medication is injected into a vein. It is given by your care team in a hospital or clinic setting. Talk to your care team about the use of this medication in children. While it may be given to children for selected conditions, precautions do apply. Overdosage: If you think you have taken too much of this medicine contact a poison control center or emergency room at  once. NOTE: This medicine is only for you. Do not share this medicine with others. What if I miss a dose? Keep appointments for follow-up doses. It is important not to miss your dose. Call your care team if you are unable to keep an appointment. What may interact with this medication? Erythromycin Phenytoin This medication may affect how other medications work, and other medications may affect the way this medication works. Talk with your care team about all of the medications you take. They may suggest changes to your treatment plan to lower the risk of side effects and to make sure your medications work as intended. This list may not describe all possible interactions. Give your health care provider a list of all the medicines, herbs, non-prescription drugs, or dietary supplements you use. Also tell them if you smoke, drink alcohol, or use illegal drugs. Some items may interact with your medicine. What should I watch for while using this medication? Your condition will be monitored carefully while you are receiving this medication. This medication may make you feel generally unwell. This is not uncommon as chemotherapy can affect healthy cells as well as cancer cells. Report any side effects. Continue your course of treatment even though you feel ill unless your care team tells you to stop. You may need blood work while taking this medication. This medication will cause constipation. If you do not have a bowel movement for 3 days, call  your care team. This medication may increase your risk to bruise or bleed. Call your care team if you notice any unusual bleeding. This medication may increase your risk of getting an infection. Call your care team for advice if you get a fever, chills, sore throat, or other symptoms of a cold or flu. Do not treat yourself. Try to avoid being around people who are sick. Be careful brushing or flossing your teeth or using a toothpick because you may get an infection  or bleed more easily. If you have any dental work done, tell your dentist you are receiving this medication. Talk to your care team if you or your partner wish to become pregnant or think either of you might be pregnant. This medication can cause serious birth defects. This medication may cause infertility. Talk to your care team if you are concerned about your fertility. Talk to your care team before breastfeeding. Changes to your treatment plan may be needed. What side effects may I notice from receiving this medication? Side effects that you should report to your care team as soon as possible: Allergic reactions--skin rash, itching, hives, swelling of the face, lips, tongue, or throat Infection--fever, chills, cough, sore throat, wounds that don't heal, pain or trouble when passing urine, general feeling of discomfort or being unwell Painful swelling, warmth, or redness of the skin, blisters or sores at the infusion site Shortness of breath or trouble breathing Unusual bruising or bleeding Side effects that usually do not require medical attention (report to your care team if they continue or are bothersome): Bone pain Constipation Hair loss Nausea Stomach pain Vomiting This list may not describe all possible side effects. Call your doctor for medical advice about side effects. You may report side effects to FDA at 1-800-FDA-1088. Where should I keep my medication? This medication is given in a hospital or clinic. It will not be stored at home. NOTE: This sheet is a summary. It may not cover all possible information. If you have questions about this medicine, talk to your doctor, pharmacist, or health care provider.  2023 Elsevier/Gold Standard (2022-02-06 00:00:00) Dexrazoxane Injection What is this medication? DEXRAZOXANE (dex ray ZOX ane) reduces the risk of heart problems caused by doxorubicin, a type of chemotherapy. This medicine may be used for other purposes; ask your health  care provider or pharmacist if you have questions. COMMON BRAND NAME(S): Totect, Zinecard What should I tell my care team before I take this medication? They need to know if you have any of these conditions: Kidney disease Liver disease An unusual or allergic reaction to dexrazoxane, other medications, foods, dyes, or preservatives Pregnant or trying to get pregnant Breast-feeding How should I use this medication? This medication is for injection or infusion into a vein. It is given by your care team in a hospital or clinic setting. This medication is given just before you are given your chemotherapy medication. Talk to your care team regarding the use of this medication in children. Special care may be needed. Overdosage: If you think you have taken too much of this medicine contact a poison control center or emergency room at once. NOTE: This medicine is only for you. Do not share this medicine with others. What if I miss a dose? This does not apply. What may interact with this medication? This medication may interact with the following: Topical dimethyl sulfoxide (when used to treat extravasation) This list may not describe all possible interactions. Give your health care provider a list  of all the medicines, herbs, non-prescription drugs, or dietary supplements you use. Also tell them if you smoke, drink alcohol, or use illegal drugs. Some items may interact with your medicine. What should I watch for while using this medication? You may need blood work done while you are taking this medication. Do not become pregnant while taking this medication or for 6 months after stopping it. Inform your care team if you wish to become pregnant or think you might be pregnant. Do not father a child while taking this medication and for 3 months after stopping it. There is a potential for serious side effects to an unborn child. Talk to your care team or pharmacist for more information. Do not breast-feed  an infant while taking this medication or for 2 weeks after stopping it. This may interfere with the ability to father a child. You should talk to your care team if you are concerned about your fertility. What side effects may I notice from receiving this medication? Side effects that you should report to your care team as soon as possible: Allergic reactions--skin rash, itching, hives, swelling of the face, lips, tongue, or throat Infection--fever, chills, cough, or sore throat Low red blood cell level--unusual weakness or fatigue, dizziness, headache, trouble breathing Unusual bruising or bleeding Side effects that usually do not require medical attention (report to your care team if they continue or are bothersome): Pain, redness, or irritation at injection site This list may not describe all possible side effects. Call your doctor for medical advice about side effects. You may report side effects to FDA at 1-800-FDA-1088. Where should I keep my medication? This medication is given in a hospital or clinic. It will not be stored at home. NOTE: This sheet is a summary. It may not cover all possible information. If you have questions about this medicine, talk to your doctor, pharmacist, or health care provider.  2023 Elsevier/Gold Standard (2021-07-21 00:00:00)  Nivolumab Injection What is this medication? NIVOLUMAB (nye VOL ue mab) treats some types of cancer. It works by helping your immune system slow or stop the spread of cancer cells. It is a monoclonal antibody. This medicine may be used for other purposes; ask your health care provider or pharmacist if you have questions. COMMON BRAND NAME(S): Opdivo What should I tell my care team before I take this medication? They need to know if you have any of these conditions: Allogeneic stem cell transplant (uses someone else's stem cells) Autoimmune diseases, such as Crohn disease, ulcerative colitis, lupus History of chest radiation Nervous  system problems, such as Guillain-Barre syndrome or myasthenia gravis Organ transplant An unusual or allergic reaction to nivolumab, other medications, foods, dyes, or preservatives Pregnant or trying to get pregnant Breast-feeding How should I use this medication? This medication is infused into a vein. It is given in a hospital or clinic setting. A special MedGuide will be given to you before each treatment. Be sure to read this information carefully each time. Talk to your care team about the use of this medication in children. While it may be prescribed for children as young as 12 years for selected conditions, precautions do apply. Overdosage: If you think you have taken too much of this medicine contact a poison control center or emergency room at once. NOTE: This medicine is only for you. Do not share this medicine with others. What if I miss a dose? Keep appointments for follow-up doses. It is important not to miss your dose. Call your  care team if you are unable to keep an appointment. What may interact with this medication? Interactions have not been studied. This list may not describe all possible interactions. Give your health care provider a list of all the medicines, herbs, non-prescription drugs, or dietary supplements you use. Also tell them if you smoke, drink alcohol, or use illegal drugs. Some items may interact with your medicine. What should I watch for while using this medication? Your condition will be monitored carefully while you are receiving this medication. You may need blood work while taking this medication. This medication may cause serious skin reactions. They can happen weeks to months after starting the medication. Contact your care team right away if you notice fevers or flu-like symptoms with a rash. The rash may be red or purple and then turn into blisters or peeling of the skin. You may also notice a red rash with swelling of the face, lips, or lymph nodes in  your neck or under your arms. Tell your care team right away if you have any change in your eyesight. Talk to your care team if you are pregnant or think you might be pregnant. A negative pregnancy test is required before starting this medication. A reliable form of contraception is recommended while taking this medication and for 5 months after the last dose. Talk to your care team about effective forms of contraception. Do not breast-feed while taking this medication and for 5 months after the last dose. What side effects may I notice from receiving this medication? Side effects that you should report to your care team as soon as possible: Allergic reactions--skin rash, itching, hives, swelling of the face, lips, tongue, or throat Dry cough, shortness of breath or trouble breathing Eye pain, redness, irritation, or discharge with blurry or decreased vision Heart muscle inflammation--unusual weakness or fatigue, shortness of breath, chest pain, fast or irregular heartbeat, dizziness, swelling of the ankles, feet, or hands Hormone gland problems--headache, sensitivity to light, unusual weakness or fatigue, dizziness, fast or irregular heartbeat, increased sensitivity to cold or heat, excessive sweating, constipation, hair loss, increased thirst or amount of urine, tremors or shaking, irritability Infusion reactions--chest pain, shortness of breath or trouble breathing, feeling faint or lightheaded Kidney injury (glomerulonephritis)--decrease in the amount of urine, red or dark brown urine, foamy or bubbly urine, swelling of the ankles, hands, or feet Liver injury--right upper belly pain, loss of appetite, nausea, light-colored stool, dark yellow or brown urine, yellowing skin or eyes, unusual weakness or fatigue Pain, tingling, or numbness in the hands or feet, muscle weakness, change in vision, confusion or trouble speaking, loss of balance or coordination, trouble walking, seizures Rash, fever, and  swollen lymph nodes Redness, blistering, peeling, or loosening of the skin, including inside the mouth Sudden or severe stomach pain, bloody diarrhea, fever, nausea, vomiting Side effects that usually do not require medical attention (report these to your care team if they continue or are bothersome): Bone, joint, or muscle pain Diarrhea Fatigue Loss of appetite Nausea Skin rash This list may not describe all possible side effects. Call your doctor for medical advice about side effects. You may report side effects to FDA at 1-800-FDA-1088. Where should I keep my medication? This medication is given in a hospital or clinic. It will not be stored at home. NOTE: This sheet is a summary. It may not cover all possible information. If you have questions about this medicine, talk to your doctor, pharmacist, or health care provider.  2023 Elsevier/Gold  Standard (2022-03-12 00:00:00)  Doxorubicin Injection What is this medication? DOXORUBICIN (dox oh ROO bi sin) treats some types of cancer. It works by slowing down the growth of cancer cells. This medicine may be used for other purposes; ask your health care provider or pharmacist if you have questions. COMMON BRAND NAME(S): Adriamycin, Adriamycin PFS, Adriamycin RDF, Rubex What should I tell my care team before I take this medication? They need to know if you have any of these conditions: Heart disease History of low blood cell levels caused by a medication Liver disease Recent or ongoing radiation An unusual or allergic reaction to doxorubicin, other medications, foods, dyes, or preservatives If you or your partner are pregnant or trying to get pregnant Breast-feeding How should I use this medication? This medication is injected into a vein. It is given by your care team in a hospital or clinic setting. Talk to your care team about the use of this medication in children. Special care may be needed. Overdosage: If you think you have taken  too much of this medicine contact a poison control center or emergency room at once. NOTE: This medicine is only for you. Do not share this medicine with others. What if I miss a dose? Keep appointments for follow-up doses. It is important not to miss your dose. Call your care team if you are unable to keep an appointment. What may interact with this medication? 6-mercaptopurine Paclitaxel Phenytoin St. John's wort Trastuzumab Verapamil This list may not describe all possible interactions. Give your health care provider a list of all the medicines, herbs, non-prescription drugs, or dietary supplements you use. Also tell them if you smoke, drink alcohol, or use illegal drugs. Some items may interact with your medicine. What should I watch for while using this medication? Your condition will be monitored carefully while you are receiving this medication. You may need blood work while taking this medication. This medication may make you feel generally unwell. This is not uncommon as chemotherapy can affect healthy cells as well as cancer cells. Report any side effects. Continue your course of treatment even though you feel ill unless your care team tells you to stop. There is a maximum amount of this medication you should receive throughout your life. The amount depends on the medical condition being treated and your overall health. Your care team will watch how much of this medication you receive. Tell your care team if you have taken this medication before. Your urine may turn red for a few days after your dose. This is not blood. If your urine is dark or brown, call your care team. In some cases, you may be given additional medications to help with side effects. Follow all directions for their use. This medication may increase your risk of getting an infection. Call your care team for advice if you get a fever, chills, sore throat, or other symptoms of a cold or flu. Do not treat yourself. Try to  avoid being around people who are sick. This medication may increase your risk to bruise or bleed. Call your care team if you notice any unusual bleeding. Talk to your care team about your risk of cancer. You may be more at risk for certain types of cancers if you take this medication. You should make sure that you get enough Coenzyme Q10 while you are taking this medication. Discuss the foods you eat and the vitamins you take with your care team. Talk to your care team if you or  your partner may be pregnant. Serious birth defects can occur if you take this medication during pregnancy and for 6 months after the last dose. Contraception is recommended while taking this medication and for 6 months after the last dose. Your care team can help you find the option that works for you. If your partner can get pregnant, use a condom while taking this medication and for 6 months after the last dose. Do not breastfeed while taking this medication. This medication may cause infertility. Talk to your care team if you are concerned about your fertility. What side effects may I notice from receiving this medication? Side effects that you should report to your care team as soon as possible: Allergic reactions--skin rash, itching, hives, swelling of the face, lips, tongue, or throat Heart failure--shortness of breath, swelling of the ankles, feet, or hands, sudden weight gain, unusual weakness or fatigue Heart rhythm changes--fast or irregular heartbeat, dizziness, feeling faint or lightheaded, chest pain, trouble breathing Infection--fever, chills, cough, sore throat, wounds that don't heal, pain or trouble when passing urine, general feeling of discomfort or being unwell Low red blood cell level--unusual weakness or fatigue, dizziness, headache, trouble breathing Painful swelling, warmth, or redness of the skin, blisters or sores at the infusion site Unusual bruising or bleeding Side effects that usually do not  require medical attention (report to your care team if they continue or are bothersome): Diarrhea Hair loss Nausea Pain, redness, or swelling with sores inside the mouth or throat Red urine This list may not describe all possible side effects. Call your doctor for medical advice about side effects. You may report side effects to FDA at 1-800-FDA-1088. Where should I keep my medication? This medication is given in a hospital or clinic. It will not be stored at home. NOTE: This sheet is a summary. It may not cover all possible information. If you have questions about this medicine, talk to your doctor, pharmacist, or health care provider.  2023 Else[employer]/Gold Standard (2022-03-21 00:00:00)

## 2022-11-27 ENCOUNTER — Inpatient Hospital Stay: Payer: Commercial Managed Care - PPO | Attending: Hematology & Oncology

## 2022-11-27 VITALS — BP 139/68 | HR 92 | Temp 97.7°F | Resp 17

## 2022-11-27 DIAGNOSIS — Z923 Personal history of irradiation: Secondary | ICD-10-CM | POA: Insufficient documentation

## 2022-11-27 DIAGNOSIS — C8195 Hodgkin lymphoma, unspecified, lymph nodes of inguinal region and lower limb: Secondary | ICD-10-CM

## 2022-11-27 DIAGNOSIS — Z79899 Other long term (current) drug therapy: Secondary | ICD-10-CM | POA: Diagnosis not present

## 2022-11-27 DIAGNOSIS — Z9484 Stem cells transplant status: Secondary | ICD-10-CM | POA: Insufficient documentation

## 2022-11-27 DIAGNOSIS — Z5111 Encounter for antineoplastic chemotherapy: Secondary | ICD-10-CM | POA: Diagnosis not present

## 2022-11-27 DIAGNOSIS — C8198 Hodgkin lymphoma, unspecified, lymph nodes of multiple sites: Secondary | ICD-10-CM | POA: Insufficient documentation

## 2022-11-27 MED ORDER — PEGFILGRASTIM-CBQV 6 MG/0.6ML ~~LOC~~ SOSY
6.0000 mg | PREFILLED_SYRINGE | Freq: Once | SUBCUTANEOUS | Status: AC
  Administered 2022-11-27: 6 mg via SUBCUTANEOUS
  Filled 2022-11-27: qty 0.6

## 2022-11-27 NOTE — Patient Instructions (Signed)

## 2022-12-05 ENCOUNTER — Other Ambulatory Visit: Payer: Self-pay | Admitting: Neurology

## 2022-12-06 ENCOUNTER — Other Ambulatory Visit: Payer: Self-pay

## 2022-12-06 ENCOUNTER — Inpatient Hospital Stay: Payer: Commercial Managed Care - PPO

## 2022-12-06 ENCOUNTER — Inpatient Hospital Stay (HOSPITAL_BASED_OUTPATIENT_CLINIC_OR_DEPARTMENT_OTHER): Payer: Commercial Managed Care - PPO | Admitting: Hematology & Oncology

## 2022-12-06 ENCOUNTER — Encounter: Payer: Self-pay | Admitting: Hematology & Oncology

## 2022-12-06 VITALS — BP 110/61 | HR 94 | Temp 98.2°F | Resp 17 | Ht 70.0 in | Wt 175.0 lb

## 2022-12-06 DIAGNOSIS — C8195 Hodgkin lymphoma, unspecified, lymph nodes of inguinal region and lower limb: Secondary | ICD-10-CM

## 2022-12-06 DIAGNOSIS — Z5111 Encounter for antineoplastic chemotherapy: Secondary | ICD-10-CM | POA: Diagnosis not present

## 2022-12-06 DIAGNOSIS — I420 Dilated cardiomyopathy: Secondary | ICD-10-CM

## 2022-12-06 LAB — CBC WITH DIFFERENTIAL (CANCER CENTER ONLY)
Abs Immature Granulocytes: 4 10*3/uL — ABNORMAL HIGH (ref 0.00–0.07)
Basophils Absolute: 0.1 10*3/uL (ref 0.0–0.1)
Basophils Relative: 0 %
Eosinophils Absolute: 0.1 10*3/uL (ref 0.0–0.5)
Eosinophils Relative: 0 %
HCT: 31.7 % — ABNORMAL LOW (ref 39.0–52.0)
Hemoglobin: 10.1 g/dL — ABNORMAL LOW (ref 13.0–17.0)
Immature Granulocytes: 18 %
Lymphocytes Relative: 5 %
Lymphs Abs: 1.1 10*3/uL (ref 0.7–4.0)
MCH: 32.4 pg (ref 26.0–34.0)
MCHC: 31.9 g/dL (ref 30.0–36.0)
MCV: 101.6 fL — ABNORMAL HIGH (ref 80.0–100.0)
Monocytes Absolute: 2.1 10*3/uL — ABNORMAL HIGH (ref 0.1–1.0)
Monocytes Relative: 10 %
Neutro Abs: 14.5 10*3/uL — ABNORMAL HIGH (ref 1.7–7.7)
Neutrophils Relative %: 67 %
Platelet Count: 178 10*3/uL (ref 150–400)
RBC: 3.12 MIL/uL — ABNORMAL LOW (ref 4.22–5.81)
RDW: 14.6 % (ref 11.5–15.5)
Smear Review: NORMAL
WBC Count: 21.8 10*3/uL — ABNORMAL HIGH (ref 4.0–10.5)
nRBC: 0.6 % — ABNORMAL HIGH (ref 0.0–0.2)

## 2022-12-06 LAB — CMP (CANCER CENTER ONLY)
ALT: 5 U/L (ref 0–44)
AST: 21 U/L (ref 15–41)
Albumin: 4 g/dL (ref 3.5–5.0)
Alkaline Phosphatase: 125 U/L (ref 38–126)
Anion gap: 8 (ref 5–15)
BUN: 32 mg/dL — ABNORMAL HIGH (ref 8–23)
CO2: 27 mmol/L (ref 22–32)
Calcium: 9.2 mg/dL (ref 8.9–10.3)
Chloride: 105 mmol/L (ref 98–111)
Creatinine: 1.48 mg/dL — ABNORMAL HIGH (ref 0.61–1.24)
GFR, Estimated: 53 mL/min — ABNORMAL LOW (ref >60)
Glucose, Bld: 115 mg/dL — ABNORMAL HIGH (ref 70–99)
Potassium: 4.2 mmol/L (ref 3.5–5.1)
Sodium: 140 mmol/L (ref 135–145)
Total Bilirubin: 0.4 mg/dL (ref 0.3–1.2)
Total Protein: 6.4 g/dL — ABNORMAL LOW (ref 6.5–8.1)

## 2022-12-06 LAB — LACTATE DEHYDROGENASE: LDH: 356 U/L — ABNORMAL HIGH (ref 98–192)

## 2022-12-06 MED ORDER — PALONOSETRON HCL INJECTION 0.25 MG/5ML
0.2500 mg | Freq: Once | INTRAVENOUS | Status: AC
  Administered 2022-12-06: 0.25 mg via INTRAVENOUS
  Filled 2022-12-06: qty 5

## 2022-12-06 MED ORDER — HEPARIN SOD (PORK) LOCK FLUSH 100 UNIT/ML IV SOLN
500.0000 [IU] | Freq: Once | INTRAVENOUS | Status: AC | PRN
  Administered 2022-12-06: 500 [IU]

## 2022-12-06 MED ORDER — SODIUM CHLORIDE 0.9 % IV SOLN
150.0000 mg | Freq: Once | INTRAVENOUS | Status: AC
  Administered 2022-12-06: 150 mg via INTRAVENOUS
  Filled 2022-12-06: qty 150

## 2022-12-06 MED ORDER — SODIUM CHLORIDE 0.9 % IV SOLN
10.0000 mg | Freq: Once | INTRAVENOUS | Status: AC
  Administered 2022-12-06: 10 mg via INTRAVENOUS
  Filled 2022-12-06: qty 10

## 2022-12-06 MED ORDER — VINBLASTINE SULFATE CHEMO INJECTION 1 MG/ML
6.0000 mg/m2 | Freq: Once | INTRAVENOUS | Status: AC
  Administered 2022-12-06: 11.9 mg via INTRAVENOUS
  Filled 2022-12-06: qty 11.9

## 2022-12-06 MED ORDER — LACTATED RINGERS IV SOLN
250.0000 mg/m2 | Freq: Once | INTRAVENOUS | Status: AC
  Administered 2022-12-06: 500 mg via INTRAVENOUS
  Filled 2022-12-06: qty 50

## 2022-12-06 MED ORDER — SODIUM CHLORIDE 0.9 % IV SOLN: Freq: Once | INTRAVENOUS | Status: AC

## 2022-12-06 MED ORDER — SODIUM CHLORIDE 0.9 % IV SOLN
240.0000 mg | Freq: Once | INTRAVENOUS | Status: AC
  Administered 2022-12-06: 240 mg via INTRAVENOUS
  Filled 2022-12-06: qty 24

## 2022-12-06 MED ORDER — SODIUM CHLORIDE 0.9% FLUSH
10.0000 mL | INTRAVENOUS | Status: DC | PRN
  Administered 2022-12-06: 10 mL

## 2022-12-06 MED ORDER — DOXORUBICIN HCL CHEMO IV INJECTION 2 MG/ML
25.0000 mg/m2 | Freq: Once | INTRAVENOUS | Status: AC
  Administered 2022-12-06: 50 mg via INTRAVENOUS
  Filled 2022-12-06: qty 25

## 2022-12-06 MED ORDER — SODIUM CHLORIDE 0.9 % IV SOLN
375.0000 mg/m2 | Freq: Once | INTRAVENOUS | Status: AC
  Administered 2022-12-06: 750 mg via INTRAVENOUS
  Filled 2022-12-06: qty 75

## 2022-12-06 NOTE — Patient Instructions (Signed)
[facility]  Discharge Instructions: Thank you for choosing [facility] to provide your oncology and hematology care.   If you have a lab appointment with the [facility], please go directly to the [facility] and check in at the registration area.  Wear comfortable clothing and clothing appropriate for easy access to any Portacath or PICC line.   We strive to give you quality time with your provider. You may need to reschedule your appointment if you arrive late (15 or more minutes).  Arriving late affects you and other patients whose appointments are after yours.  Also, if you miss three or more appointments without notifying the office, you may be dismissed from the clinic at the provider's discretion.      For prescription refill requests, have your pharmacy contact our office and allow 72 hours for refills to be completed.    Today you received the following chemotherapy and/or immunotherapy agents AVD, Opdivo      To help prevent nausea and vomiting after your treatment, we encourage you to take your nausea medication as directed.  BELOW ARE SYMPTOMS THAT SHOULD BE REPORTED IMMEDIATELY: *FEVER GREATER THAN 100.4 F (38 C) OR HIGHER *CHILLS OR SWEATING *NAUSEA AND VOMITING THAT IS NOT CONTROLLED WITH YOUR NAUSEA MEDICATION *UNUSUAL SHORTNESS OF BREATH *UNUSUAL BRUISING OR BLEEDING *URINARY PROBLEMS (pain or burning when urinating, or frequent urination) *BOWEL PROBLEMS (unusual diarrhea, constipation, pain near the anus) TENDERNESS IN MOUTH AND THROAT WITH OR WITHOUT PRESENCE OF ULCERS (sore throat, sores in mouth, or a toothache) UNUSUAL RASH, SWELLING OR PAIN  UNUSUAL VAGINAL DISCHARGE OR ITCHING   Items with * indicate a potential emergency and should be followed up as soon as possible or go to the Emergency Department if any problems should occur.  Please show the CHEMOTHERAPY ALERT CARD or IMMUNOTHERAPY ALERT CARD at check-in to the  Emergency Department and triage nurse. Should you have questions after your visit or need to cancel or reschedule your appointment, please contact [facility]  [phone] and follow the prompts.  Office hours are 8:00 a.m. to 4:30 p.m. Monday - Friday. Please note that voicemails left after 4:00 p.m. may not be returned until the following business day.  We are closed weekends and major holidays. You have access to a nurse at all times for urgent questions. Please call the main number to the clinic [phone] and follow the prompts.  For any non-urgent questions, you may also contact your provider using MyChart. We now offer e-Visits for anyone 41 and older to request care online for non-urgent symptoms. For details visit [email or web address].   Also download the MyChart app! Go to the app store, search "MyChart", open the app, select [facility], and log in with your MyChart username and password.

## 2022-12-06 NOTE — Progress Notes (Signed)
Last dose of pegfilgrastim delayed due to holiday weekend, will proceed with chemo as scheduled today per Dr. Marin Olp.

## 2022-12-06 NOTE — Progress Notes (Signed)
Hematology and Oncology Follow Up Visit  Brian [name] [id number] [birth year] 62 y.o. 12/06/2022   Principle Diagnosis:  Recurrent Hodgkin's Disease -  S/p CAR-T therapy -- relapsed TIA-resolved  Current Therapy:  ANVD -- s/p cycle #2 -- start on 10/25/2022 Nivolumab q 3 month -- s/p cycle #18 -- on hold XRT -- [facility] field --completed on 08/06/2022  Neulasta 6 mg IM post each chemotherapy       Interim History:  [name] is in for follow-up.  He has responded very well to the Neulasta.  I do not think he needs any Neulasta after this cycle of treatment.  Otherwise, he seems to be managing pretty well.  He is exercising a little bit.  He is dealing with the Parkinson's.  I do not think that the Parkinson's is any worse.  He has had no fever.  He has had no swollen lymph nodes.  He has had no change in bowel or bladder habits.  Is a chronic lower back issues.  These really is not different.  He has had no rashes.  He has had no bleeding.  There is been no cough or shortness of breath.  He has had no chest wall pain.  Overall, I would say his performance status is ECOG 1.     Medications:  Current Outpatient Medications:    acetaminophen (TYLENOL) 500 MG tablet, Take 1,000 mg by mouth every 8 (eight) hours as needed for moderate pain., Disp: , Rfl:    carbidopa-levodopa (SINEMET CR) 50-200 MG tablet, TAKE 1 TABLET BY MOUTH AT BEDTIME, Disp: 90 tablet, Rfl: 0   carbidopa-levodopa (SINEMET IR) 25-100 MG tablet, TAKE 2 TABLETS AT 7AM, 11AM, 3PM, AND AT 7PM DAILY. (Patient taking differently: TAKE 2 TABLETS AT 7AM, 11AM, 3PM.), Disp: 720 tablet, Rfl: 0   cholecalciferol (VITAMIN D3) 25 MCG (1000 UNIT) tablet, Take 1,000 Units by mouth daily., Disp: , Rfl:    ciprofloxacin (CIPRO) 500 MG tablet, Take 1 tablet (500 mg total) by mouth daily with breakfast., Disp: 10 tablet, Rfl: 4   dexamethasone (DECADRON) 4 MG tablet, Take 2 tablets (8 mg) by mouth daily for 3 days after  chemotherapy. Take with food., Disp: 30 tablet, Rfl: 1   ibuprofen (ADVIL) 200 MG tablet, Take 200 mg by mouth every 6 (six) hours as needed for moderate pain. (Patient not taking: Reported on 11/23/2022), Disp: , Rfl:    ondansetron (ZOFRAN) 8 MG tablet, Take 1 tablet (8 mg total) by mouth every 8 (eight) hours as needed for nausea or vomiting. Start on the third day after chemotherapy. (Patient not taking: Reported on 11/09/2022), Disp: 30 tablet, Rfl: 1   pramipexole (MIRAPEX) 0.5 MG tablet, TAKE 1 TABLET(0.5 MG) BY MOUTH THREE TIMES DAILY, Disp: 270 tablet, Rfl: 1   prochlorperazine (COMPAZINE) 10 MG tablet, Take 1 tablet (10 mg total) by mouth every 6 (six) hours as needed for nausea or vomiting. (Patient not taking: Reported on 11/09/2022), Disp: 30 tablet, Rfl: 1  Allergies: No Known Allergies  Past Medical History, Surgical history, Social history, and Family History were reviewed and updated.  Review of Systems: Review of Systems  Neurological:  Positive for tremors.  All other systems reviewed and are negative.    Physical Exam:  height is '5\' 10"'$  (1.778 m) and weight is 175 lb (79.4 kg). His oral temperature is 98.2 F (36.8 C). His blood pressure is 110/61 and his pulse is 94. His respiration is 17 and oxygen saturation is  94%.   Physical Exam Vitals reviewed.  HENT:     Head: Normocephalic and atraumatic.  Eyes:     Pupils: Pupils are equal, round, and reactive to light.  Cardiovascular:     Rate and Rhythm: Normal rate and regular rhythm.     Heart sounds: Normal heart sounds.  Pulmonary:     Effort: Pulmonary effort is normal.     Breath sounds: Normal breath sounds.  Abdominal:     General: Bowel sounds are normal.     Palpations: Abdomen is soft.  Musculoskeletal:        General: No tenderness or deformity. Normal range of motion.     Cervical back: Normal range of motion.  Lymphadenopathy:     Cervical: No cervical adenopathy.  Skin:    General: Skin is warm  and dry.     Findings: No erythema or rash.  Neurological:     Mental Status: He is alert and oriented to person, place, and time.  Psychiatric:        Behavior: Behavior normal.        Thought Content: Thought content normal.        Judgment: Judgment normal.     Lab Results  Component Value Date   WBC 21.8 (H) 12/06/2022   HGB 10.1 (L) 12/06/2022   HCT 31.7 (L) 12/06/2022   MCV 101.6 (H) 12/06/2022   PLT 178 12/06/2022     Chemistry      Component Value Date/Time   NA 140 12/06/2022 1028   NA 142 04/02/2017 0000   NA 140 07/19/2016 1305   K 4.2 12/06/2022 1028   K 4.4 07/19/2016 1305   CL 105 12/06/2022 1028   CL 105 02/08/2016 1117   CL 107 03/25/2013 0828   CO2 27 12/06/2022 1028   CO2 26 07/19/2016 1305   BUN 32 (H) 12/06/2022 1028   BUN 21 04/02/2017 0000   BUN 22.8 07/19/2016 1305   CREATININE 1.48 (H) 12/06/2022 1028   CREATININE 1.1 07/19/2016 1305   GLU 105 04/02/2017 0000      Component Value Date/Time   CALCIUM 9.2 12/06/2022 1028   CALCIUM 9.8 07/19/2016 1305   ALKPHOS 125 12/06/2022 1028   ALKPHOS 76 07/19/2016 1305   AST 21 12/06/2022 1028   AST 18 07/19/2016 1305   ALT 5 12/06/2022 1028   ALT 14 07/19/2016 1305   BILITOT 0.4 12/06/2022 1028   BILITOT 0.70 07/19/2016 1305      Impression and Plan: Brian [name] is a 62 year old gentleman.  He has history of recurrent Hodgkin's disease. 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 He then had a recurrence after this.  He did undergo CAR-T therapy at [facility].  I believe he had this in April 2017.  He did well with this.  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  This was proven by biopsy.  He had radiation therapy for this.  He subsequently has had another recurrence.  This was in the right axilla.  This was biopsy-proven.  He did undergo radiation therapy for this.  This was completed in September/2023.  Now, we have another recurrence.  I really should not be  surprised by this.  We will go and finish out his second cycle of  ANVD.  After this cycle, we will then do our PET scan.  Hopefully we will see that he is in remission.  If not, then we will have to  clearly think about another option for therapy.  I will give him an extra week off after this cycle.  I would like to do the PET scan in 2 weeks.  I will then get him back in 3 weeks.       [name], MD 1/11/202411:22 AM

## 2022-12-06 NOTE — Patient Instructions (Signed)

## 2022-12-10 ENCOUNTER — Telehealth: Payer: Self-pay | Admitting: *Deleted

## 2022-12-10 NOTE — Telephone Encounter (Signed)
Per 12/06/22 los - called and lvm of upcoming appointments - requested call back to confirm

## 2022-12-20 ENCOUNTER — Encounter: Payer: Self-pay | Admitting: *Deleted

## 2022-12-20 ENCOUNTER — Ambulatory Visit (HOSPITAL_COMMUNITY)
Admission: RE | Admit: 2022-12-20 | Discharge: 2022-12-20 | Disposition: A | Discharge status: Home / Self Care | Payer: Commercial Managed Care - PPO | Source: Ambulatory Visit | Attending: Hematology & Oncology | Admitting: Hematology & Oncology

## 2022-12-20 DIAGNOSIS — I10 Essential (primary) hypertension: Secondary | ICD-10-CM | POA: Insufficient documentation

## 2022-12-20 DIAGNOSIS — Z923 Personal history of irradiation: Secondary | ICD-10-CM | POA: Diagnosis not present

## 2022-12-20 DIAGNOSIS — I493 Ventricular premature depolarization: Secondary | ICD-10-CM | POA: Insufficient documentation

## 2022-12-20 DIAGNOSIS — G20A1 Parkinson's disease without dyskinesia, without mention of fluctuations: Secondary | ICD-10-CM | POA: Diagnosis not present

## 2022-12-20 DIAGNOSIS — Z0189 Encounter for other specified special examinations: Secondary | ICD-10-CM

## 2022-12-20 DIAGNOSIS — C8190 Hodgkin lymphoma, unspecified, unspecified site: Secondary | ICD-10-CM | POA: Diagnosis not present

## 2022-12-20 DIAGNOSIS — I420 Dilated cardiomyopathy: Secondary | ICD-10-CM | POA: Diagnosis not present

## 2022-12-20 LAB — ECHOCARDIOGRAM LIMITED: S' Lateral: 3.3 cm

## 2022-12-20 NOTE — Progress Notes (Signed)
  Echocardiogram 2D Echocardiogram has been performed.  Brian [name] 12/20/2022, 2:03 PM

## 2022-12-24 ENCOUNTER — Telehealth: Payer: Self-pay | Admitting: Neurology

## 2022-12-24 NOTE — Progress Notes (Unsigned)
Assessment/Plan:   1.  Parkinsons Disease  -Continue carbidopa/levodopa 25/100 and take 2 tablets at 7 AM, 2 tablets at 11 AM, 2 tablet at 3 PM, 2 tablet at 7 PM.             -Continue carbidopa/levodopa 50/200 at bedtime.             -Continue pramipexole 0.5 mg 3 times per day.  Higher dosages caused hallucinations.  -medications optimally dosed and working well right now  -Patient sought second opinion at [facility] with Dr. Jetta Lout in May, 2023.  There was no significant changes in medication.  -no treadmill.  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  Patient has symptomatic reinnervation of the right face.   3.  Hodgkin's lymphoma             -PET scan in November, 2023 with evidence of recurrence again.  He has finished his chemotherapy again and plan is to repeat the PET scan.  This is scheduled for January 31.   4.  Renal insufficiency             -monitoring.  Mild.     5.  Lumbar radiculopathy             -Patient had microdiscectomy Mar 29, 2021  -Microdiscectomy complicated by subdural CSF fluid collection at L1 vertebral body and L5-S1 level.  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  -Patient seen by [facility] neurology and [facility]surgery.  [facility] neurosurgery just recommended L5-S1 nerve injection. Subjective:   Brian [name] was seen today in follow up for Parkinsons disease.  My previous records were reviewed prior to todays visit as well as outside records available to me.  Pt with girlfriend who supplements the hx.   he has had multiple appointments with other providers since our last visit.  His blood pressure has been fluctuating send his blood pressure medication was reduced.  His blood pressure improved on follow-up visit with  primary care.  He states that dr. Marin Olp stopped the bp medication all together.  He developed increasing back pain after radiation for his Hodgkin's lymphoma and did contact [facility] and they stated that they were going to schedule an MRI and appointment on the same day.  I can see that this was scheduled for October 17, 2022 but results are not within Care Everywhere. Pt states that he just didn't have it done because he had too much going on.  Its on the back burner.   PET scan in November for his Hodgkin's lymphoma demonstrated increased activity in the mediastinum.  He subsequently started chemotherapy.  He has now completed this round.  He has a PET scan tomorrow.  He had one fall off of a treadmill - went to step back on it and lost balance and fell.    Current prescribed movement disorder medications: carbidopa/levodopa 25/100, 2 tablets at 7 AM, 2 tablets at 11 AM, 2 tablet at 3 PM, 2 tablet at 7 PM  carbidopa/levodopa 50/200 CR q hs  Pramipexole 0.5 mg 3 times per day (higher dosages with hallucinations)   PREVIOUS MEDICATIONS: carbidopa/levodopa 50/200 CR (d/c  just because tried it for pain and still waking up with pain in back so d/c it); amantadine (he didn't want to take it and he stopped it - didn't think that dyskinesia was an issue)  ALLERGIES:  No Known Allergies  CURRENT MEDICATIONS:  Outpatient Encounter Medications as of 12/25/2022  Medication Sig   carbidopa-levodopa (SINEMET CR) 50-200 MG tablet TAKE 1 TABLET BY MOUTH AT BEDTIME   carbidopa-levodopa (SINEMET IR) 25-100 MG tablet TAKE 2 TABLETS AT 7AM, 11AM, 3PM, AND AT 7PM DAILY. (Patient taking differently: TAKE 2 TABLETS AT 7AM, 11AM, 3PM.)   cholecalciferol (VITAMIN D3) 25 MCG (1000 UNIT) tablet Take 1,000 Units by mouth daily.   ciprofloxacin (CIPRO) 500 MG tablet Take 1 tablet (500 mg total) by mouth daily with breakfast.   dexamethasone (DECADRON) 4 MG tablet Take 2 tablets (8 mg) by mouth daily for 3 days after  chemotherapy. Take with food.   pramipexole (MIRAPEX) 0.5 MG tablet TAKE 1 TABLET(0.5 MG) BY MOUTH THREE TIMES DAILY   acetaminophen (TYLENOL) 500 MG tablet Take 1,000 mg by mouth every 8 (eight) hours as needed for moderate pain.   ondansetron (ZOFRAN) 8 MG tablet Take 1 tablet (8 mg total) by mouth every 8 (eight) hours as needed for nausea or vomiting. Start on the third day after chemotherapy. (Patient not taking: Reported on 12/25/2022)   prochlorperazine (COMPAZINE) 10 MG tablet Take 1 tablet (10 mg total) by mouth every 6 (six) hours as needed for nausea or vomiting. (Patient not taking: Reported on 11/09/2022)   [DISCONTINUED] ibuprofen (ADVIL) 200 MG tablet Take 200 mg by mouth every 6 (six) hours as needed for moderate pain. (Patient not taking: Reported on 11/23/2022)   No facility-administered encounter medications on file as of 12/25/2022.    Objective:   PHYSICAL EXAMINATION:    VITALS:   Vitals:   12/25/22 0923  BP: 100/70  Pulse: 91  SpO2: 98%  Weight: 179 lb (81.2 kg)  Height: '5\' 10"'$  (1.778 m)    GEN:  The patient appears stated age and is in NAD. HEENT:  Normocephalic, atraumatic.  The mucous membranes are moist. The superficial temporal arteries are without ropiness or tenderness. CV:  RRR Lungs:  CTAB Neck/HEME:  There are no carotid bruits bilaterally.  Neurological examination:  Orientation: The patient is alert and oriented x3. Cranial nerves: There is good facial symmetry with facial hypomimia. The speech is fluent and clear. Soft palate rises symmetrically and there is no tongue deviation. Hearing is intact to conversational tone. Sensation: Sensation is intact to light touch throughout Motor: Strength is at least antigravity x4.  Movement examination: Tone: There is normal tone in the normal Abnormal movements: there is LLE dyskinesia Coordination:  There is no decremation with RAM's Gait and Station: The patient pushes off to arise.  He ambulates  fairly well with excess arm swing and he has foot drop to the L  I have reviewed and interpreted the following labs independently    Chemistry      Component Value Date/Time   NA 140 12/06/2022 1028   NA 142 04/02/2017 0000   NA 140 07/19/2016 1305   K 4.2 12/06/2022 1028   K 4.4 07/19/2016 1305   CL 105 12/06/2022 1028   CL 105 02/08/2016 1117   CL 107 03/25/2013 0828   CO2 27 12/06/2022 1028   CO2 26 07/19/2016 1305   BUN 32 (H) 12/06/2022 1028   BUN 21 04/02/2017 0000   BUN 22.8 07/19/2016  1305   CREATININE 1.48 (H) 12/06/2022 1028   CREATININE 1.1 07/19/2016 1305   GLU 105 04/02/2017 0000      Component Value Date/Time   CALCIUM 9.2 12/06/2022 1028   CALCIUM 9.8 07/19/2016 1305   ALKPHOS 125 12/06/2022 1028   ALKPHOS 76 07/19/2016 1305   AST 21 12/06/2022 1028   AST 18 07/19/2016 1305   ALT 5 12/06/2022 1028   ALT 14 07/19/2016 1305   BILITOT 0.4 12/06/2022 1028   BILITOT 0.70 07/19/2016 1305       Lab Results  Component Value Date   WBC 21.8 (H) 12/06/2022   HGB 10.1 (L) 12/06/2022   HCT 31.7 (L) 12/06/2022   MCV 101.6 (H) 12/06/2022   PLT 178 12/06/2022    Lab Results  Component Value Date   TSH 5.07 10/16/2022     Total time spent on today's visit was 31 minutes, including both face-to-face time and nonface-to-face time.  Time included that spent on review of records (prior notes available to me/labs/imaging if pertinent), discussing treatment and goals, answering patient's questions and coordinating care.  Cc:  Copland, [name], MD

## 2022-12-24 NOTE — Telephone Encounter (Signed)
Called Brian [name] the patient and lynn the representative Jeani Hawking to l;eave a message that we do not do that type of testing not that [name] isn't a patient here and not that wee refused we don't do FCE testing in our office we have no one qualified to do it here in our office

## 2022-12-24 NOTE — Telephone Encounter (Signed)
Patient states [employer] [place] [employer] was told that he was not a patient here and we had no records  he has asked her to resend it due to a mistake  as he is a patient here in the office. He wanted [place] to be aware of the fax that should be coming .  Jeani Hawking at [phone] [employer] [place]

## 2022-12-25 ENCOUNTER — Ambulatory Visit (INDEPENDENT_AMBULATORY_CARE_PROVIDER_SITE_OTHER): Payer: Commercial Managed Care - PPO | Admitting: Neurology

## 2022-12-25 VITALS — BP 100/70 | HR 91 | Ht 70.0 in | Wt 179.0 lb

## 2022-12-25 DIAGNOSIS — G20B2 Parkinson's disease with dyskinesia, with fluctuations: Secondary | ICD-10-CM

## 2022-12-25 DIAGNOSIS — C8195 Hodgkin lymphoma, unspecified, lymph nodes of inguinal region and lower limb: Secondary | ICD-10-CM

## 2022-12-25 NOTE — Patient Instructions (Signed)
Wishing you the best tomorrow!!  It was good to see you  The physicians and staff at [facility] are committed to providing excellent care. You may receive a survey requesting feedback about your experience at our office. We strive to receive "very good" responses to the survey questions. If you feel that your experience would prevent you from giving the office a "very good " response, please contact our office to try to remedy the situation. We may be reached at [phone]. Thank you for taking the time out of your busy day to complete the survey.  Local and Online Resources for Power over Parkinson's Group  January 2024   LOCAL [place] PARKINSON'S GROUPS   Power over Parkinson's Group:    Power Over Parkinson's Patient Education Group will be Wednesday, January 10th-*Hybrid meting*- in person at [facility] location and via [facility], 2:00-3:00 pm.   Starting in November, Power over [employer] and Care Partner Groups will meet together, with plans for separate break out session for caregivers (*this will be evolving over the next few months) Upcoming Power over Parkinson's Meetings/Care Partner Support:  2nd Wednesdays of the month at 2 pm:   January 10th, February 14th [place] at [email or web address] if interested in participating in this group    [place]! Moves Dynegy Instructor-Led Classes offering at [employer]!  TUESDAYS and Wednesdays 1-2 pm.   Contact Vonna Kotyk at  [email or web address]  or [phone] (Tuesday classes are modified for chair and standing only) Drumming for Parkinson's will be held on 2nd and 4th Mondays at 11:00 am.   Located at the [place] ([place]  Contact Doylene Canning at [email or web address] or [place] Class, Mondays at 11 am.  Call [phone] for details Let's Try  Pickleball-$25 for 6 weeks of Pickleball in [place].  Contact Corwin Levins for more details and for dates.  [email or web address] SAVE THE DATE and REGISTER:  [place] Chapter of Parkinson's Foundation:  Parkinson's Symposium.  Conversations about Parkinson's.  Saturday, January 26, 2023, 9:00 am-2:00 pm.  [place], *In person or online via [place]*.  Register at [email or web address] or call Beverlee Nims at [phone].   [place]:  [email or web address]  PD Health at Home continues:  Mindfulness Mondays, Wellness Wednesdays, Fitness Fridays   Upcoming Education:    Environmental health practitioner your Voice:  Air cabin crew. Wednesday, January 3rd,  1-2 pm  Managing Weight Loss & Retaining Muscle Mass.  Wednesday, Jan 10th, 1-2 pm Changes in Speech and Voice.  Wednesday, January 17th, 1-2 pm Register for virtual education and Patent attorney (webinars) at DebtSupply.pl Please check out their website to sign up for emails and see their full online offerings      [place]:  [email or web address]   Third Thursday Webinars:  On the third Thursday of every month at 12 p.m. ET, join our free live webinars to learn about various aspects of living with Parkinson's disease and our work to speed medical breakthroughs.  Upcoming Webinar:  New Year, New Moves!  Explore Exercise for Life with Parkinson's.  Thursday, January 18th at 12 noon. Check out additional information on their website to see their full online offerings    [facility]:  [email or web address]  Upcoming Webinar:   Physical Therapy and Parkinson's.  Thursday, January 11th, 2 pm  Webinar Series:  Living with Parkinson's  Meetup.   Third Thursdays each month, 3 pm  Care Partner Monthly Meetup.  With Robin Searing Phinney.  First Tuesday of each month, 2 pm  Check out additional  information to Live Well Today on their website    Parkinson and Movement Disorders (PMD) Alliance:  www.[email or web address]  [employer] Online:  Online Education Events  Sign up for emails, which are sent weekly to give you updates on programming and online offerings    Parkinson's Association of the [place]:  www.parkinsonassociation.org  Information on online support groups, education events, and online exercises including Yoga, Parkinson's exercises and more-LOTS of information on links to PD resources and online events  Virtual Support Group through Parkinson's Association of the [place]; next one is scheduled for Wednesday, Feb 7th  MOVEMENT AND EXERCISE OPPORTUNITIES  PWR! Moves Classes at [place].  Wednesdays 10 and 11 am.   Contact Amy Marriott, PT [email or web address] if interested.  NEW PWR! Moves Class offerings at [employer].  *TUESDAYS* and Wednesdays 1-2 pm.    Contact Vonna Kotyk at  [email or web address]    Parkinson's Wellness Recovery (PWR! Moves)  www.[email or web address]  Info on the PWR! Virtual Experience:  You will have access to our expertise?through self-assessment, guided plans that start with the PD-specific fundamentals, educational content, tips, Q&A with an expert, and a growing Art therapist of PD-specific pre-recorded and live exercise classes of varying types and intensity - both physical and cognitive! If that is not enough, we offer 1:1 wellness consultations (in-person or virtual) to personalize your PWR! Research scientist (medical).   [place] Fridays:   As part of the PD Health @ Home program, this free video series focuses each week on one aspect of fitness designed to support people living with Parkinson's.? These weekly videos highlight the [place] fitness guidelines for people with Parkinson's disease.  ModemGamers.[email or web address]   Dance for PD website is offering  free, live-stream classes throughout the week, as well as links to [employer] of classes:  [email or web address]  Virtual dance and Pilates for Parkinson's classes: Click on the Community Tab> Parkinson's Movement Initiative Tab.  To register for classes and for more information, visit [email or web address] and click the "community" tab.   [employer] Parkinson's Cycling Classes   [employer]:  Thursdays @ Noon-Live classes at [employer] ([employer] at [email or web address]?or [phone])  [facility] [employer]: Virtual Classes Mondays and Thursdays Jeanette Caprice classes Tuesday, Wednesday and Thursday (contact [place] at [email or web address] ?or [phone])  [place]  Varied levels of classes are offered Tuesdays and Thursdays at [employer].   Stretching with Verdis Frederickson weekly class is also offered for people with Parkinson's  To observe a class or for more information, call [phone] or email Hezzie Bump at [email or web address]   ADDITIONAL SUPPORT AND RESOURCES  Well[employer]Spring [employer]:Online Caregiver Education Opportunities:  www.well-springsolutions.org/caregiver-education/caregiver-support-group.  You may also contact Vickki Muff at [email or web address] or [phone].     Well-Spring Navigator:  Just1Navigator program, a?free service to help individuals and families through the journey of determining care for older adults.  The "Navigator" is a [phone], Education officer, museum, who will speak with a prospective client and/or loved ones to provide an assessment of the situation and a set of recommendations for a personalized care plan -- all free of charge, and whether?Well-Spring [employer] offers the needed service or not. If the need is not a service we provide, we are well-connected with reputable programs in town that we can refer you to.  www.well-springsolutions.org or to speak with the  Navigator, call  [phone].

## 2022-12-26 ENCOUNTER — Encounter (HOSPITAL_COMMUNITY)
Admission: RE | Admit: 2022-12-26 | Discharge: 2022-12-26 | Disposition: A | Discharge status: Home / Self Care | Payer: Commercial Managed Care - PPO | Source: Ambulatory Visit | Attending: Hematology & Oncology | Admitting: Hematology & Oncology

## 2022-12-26 ENCOUNTER — Other Ambulatory Visit: Payer: Self-pay

## 2022-12-26 DIAGNOSIS — C8195 Hodgkin lymphoma, unspecified, lymph nodes of inguinal region and lower limb: Secondary | ICD-10-CM | POA: Diagnosis present

## 2022-12-26 LAB — GLUCOSE, CAPILLARY: Glucose-Capillary: 109 mg/dL — ABNORMAL HIGH (ref 70–99)

## 2022-12-26 MED ORDER — FLUDEOXYGLUCOSE F - 18 (FDG) INJECTION
8.7200 | Freq: Once | INTRAVENOUS | Status: AC | PRN
  Administered 2022-12-26: 8.72 via INTRAVENOUS

## 2022-12-27 ENCOUNTER — Inpatient Hospital Stay (HOSPITAL_BASED_OUTPATIENT_CLINIC_OR_DEPARTMENT_OTHER): Payer: Commercial Managed Care - PPO | Admitting: Hematology & Oncology

## 2022-12-27 ENCOUNTER — Other Ambulatory Visit: Payer: Self-pay

## 2022-12-27 ENCOUNTER — Inpatient Hospital Stay: Payer: Commercial Managed Care - PPO

## 2022-12-27 ENCOUNTER — Encounter: Payer: Self-pay | Admitting: Hematology & Oncology

## 2022-12-27 ENCOUNTER — Inpatient Hospital Stay: Payer: Commercial Managed Care - PPO | Attending: Hematology & Oncology

## 2022-12-27 VITALS — BP 128/74 | HR 94 | Temp 98.0°F | Resp 18 | Ht 70.0 in | Wt 175.1 lb

## 2022-12-27 DIAGNOSIS — G20A1 Parkinson's disease without dyskinesia, without mention of fluctuations: Secondary | ICD-10-CM | POA: Insufficient documentation

## 2022-12-27 DIAGNOSIS — C8198 Hodgkin lymphoma, unspecified, lymph nodes of multiple sites: Secondary | ICD-10-CM | POA: Insufficient documentation

## 2022-12-27 DIAGNOSIS — C8195 Hodgkin lymphoma, unspecified, lymph nodes of inguinal region and lower limb: Secondary | ICD-10-CM

## 2022-12-27 DIAGNOSIS — Z923 Personal history of irradiation: Secondary | ICD-10-CM | POA: Diagnosis not present

## 2022-12-27 DIAGNOSIS — Z5111 Encounter for antineoplastic chemotherapy: Secondary | ICD-10-CM | POA: Insufficient documentation

## 2022-12-27 DIAGNOSIS — Z9484 Stem cells transplant status: Secondary | ICD-10-CM | POA: Insufficient documentation

## 2022-12-27 DIAGNOSIS — Z79899 Other long term (current) drug therapy: Secondary | ICD-10-CM | POA: Insufficient documentation

## 2022-12-27 LAB — CBC WITH DIFFERENTIAL (CANCER CENTER ONLY)
Abs Immature Granulocytes: 0.25 10*3/uL — ABNORMAL HIGH (ref 0.00–0.07)
Basophils Absolute: 0.1 10*3/uL (ref 0.0–0.1)
Basophils Relative: 1 %
Eosinophils Absolute: 0.2 10*3/uL (ref 0.0–0.5)
Eosinophils Relative: 2 %
HCT: 32 % — ABNORMAL LOW (ref 39.0–52.0)
Hemoglobin: 10.2 g/dL — ABNORMAL LOW (ref 13.0–17.0)
Immature Granulocytes: 3 %
Lymphocytes Relative: 6 %
Lymphs Abs: 0.5 10*3/uL — ABNORMAL LOW (ref 0.7–4.0)
MCH: 31.8 pg (ref 26.0–34.0)
MCHC: 31.9 g/dL (ref 30.0–36.0)
MCV: 99.7 fL (ref 80.0–100.0)
Monocytes Absolute: 1.2 10*3/uL — ABNORMAL HIGH (ref 0.1–1.0)
Monocytes Relative: 15 %
Neutro Abs: 6 10*3/uL (ref 1.7–7.7)
Neutrophils Relative %: 73 %
Platelet Count: 318 10*3/uL (ref 150–400)
RBC: 3.21 MIL/uL — ABNORMAL LOW (ref 4.22–5.81)
RDW: 14.7 % (ref 11.5–15.5)
WBC Count: 8.1 10*3/uL (ref 4.0–10.5)
nRBC: 0.2 % (ref 0.0–0.2)

## 2022-12-27 LAB — CMP (CANCER CENTER ONLY)
ALT: <5 U/L (ref 0–44)
AST: 12 U/L — ABNORMAL LOW (ref 15–41)
Albumin: 4 g/dL (ref 3.5–5.0)
Alkaline Phosphatase: 67 U/L (ref 38–126)
Anion gap: 7 (ref 5–15)
BUN: 43 mg/dL — ABNORMAL HIGH (ref 8–23)
CO2: 28 mmol/L (ref 22–32)
Calcium: 9.4 mg/dL (ref 8.9–10.3)
Chloride: 104 mmol/L (ref 98–111)
Creatinine: 1.44 mg/dL — ABNORMAL HIGH (ref 0.61–1.24)
GFR, Estimated: 55 mL/min — ABNORMAL LOW (ref >60)
Glucose, Bld: 94 mg/dL (ref 70–99)
Potassium: 4.4 mmol/L (ref 3.5–5.1)
Sodium: 139 mmol/L (ref 135–145)
Total Bilirubin: 0.4 mg/dL (ref 0.3–1.2)
Total Protein: 6.8 g/dL (ref 6.5–8.1)

## 2022-12-27 LAB — LACTATE DEHYDROGENASE: LDH: 217 U/L — ABNORMAL HIGH (ref 98–192)

## 2022-12-27 MED ORDER — HEPARIN SOD (PORK) LOCK FLUSH 100 UNIT/ML IV SOLN
500.0000 [IU] | Freq: Once | INTRAVENOUS | Status: AC | PRN
  Administered 2022-12-27: 500 [IU]

## 2022-12-27 MED ORDER — SODIUM CHLORIDE 0.9% FLUSH
10.0000 mL | INTRAVENOUS | Status: DC | PRN
  Administered 2022-12-27: 10 mL

## 2022-12-27 MED ORDER — SODIUM CHLORIDE 0.9 % IV SOLN: Freq: Once | INTRAVENOUS | Status: AC

## 2022-12-27 MED ORDER — PALONOSETRON HCL INJECTION 0.25 MG/5ML
0.2500 mg | Freq: Once | INTRAVENOUS | Status: AC
  Administered 2022-12-27: 0.25 mg via INTRAVENOUS
  Filled 2022-12-27: qty 5

## 2022-12-27 MED ORDER — VINBLASTINE SULFATE CHEMO INJECTION 1 MG/ML
6.0000 mg/m2 | Freq: Once | INTRAVENOUS | Status: AC
  Administered 2022-12-27: 11.9 mg via INTRAVENOUS
  Filled 2022-12-27: qty 11.9

## 2022-12-27 MED ORDER — SODIUM CHLORIDE 0.9 % IV SOLN
240.0000 mg | Freq: Once | INTRAVENOUS | Status: AC
  Administered 2022-12-27: 240 mg via INTRAVENOUS
  Filled 2022-12-27: qty 24

## 2022-12-27 MED ORDER — SODIUM CHLORIDE 0.9 % IV SOLN
10.0000 mg | Freq: Once | INTRAVENOUS | Status: AC
  Administered 2022-12-27: 10 mg via INTRAVENOUS
  Filled 2022-12-27: qty 10

## 2022-12-27 MED ORDER — SODIUM CHLORIDE 0.9 % IV SOLN
375.0000 mg/m2 | Freq: Once | INTRAVENOUS | Status: AC
  Administered 2022-12-27: 750 mg via INTRAVENOUS
  Filled 2022-12-27: qty 75

## 2022-12-27 MED ORDER — SODIUM CHLORIDE 0.9 % IV SOLN
150.0000 mg | Freq: Once | INTRAVENOUS | Status: AC
  Administered 2022-12-27: 150 mg via INTRAVENOUS
  Filled 2022-12-27: qty 150

## 2022-12-27 MED ORDER — DOXORUBICIN HCL CHEMO IV INJECTION 2 MG/ML
25.0000 mg/m2 | Freq: Once | INTRAVENOUS | Status: AC
  Administered 2022-12-27: 50 mg via INTRAVENOUS
  Filled 2022-12-27: qty 25

## 2022-12-27 MED ORDER — LACTATED RINGERS IV SOLN
250.0000 mg/m2 | Freq: Once | INTRAVENOUS | Status: AC
  Administered 2022-12-27: 500 mg via INTRAVENOUS
  Filled 2022-12-27: qty 50

## 2022-12-27 NOTE — Patient Instructions (Signed)

## 2022-12-27 NOTE — Progress Notes (Signed)
Hematology and Oncology Follow Up Visit  Brian [name] [id number] Dec 05, 1960 62 y.o. 12/27/2022   Principle Diagnosis:  Recurrent Hodgkin's Disease -  S/p CAR-T therapy -- relapsed TIA-resolved  Current Therapy:  ANVD -- s/p cycle #2 -- start on 10/25/2022 Nivolumab q 3 month -- s/p cycle #18 -- on hold XRT -- Involved field --completed on 08/06/2022  Neulasta 6 mg IM post each chemotherapy       Interim History:  [name] is in for follow-up.  He is doing quite well.  He had a PET scan that was done yesterday.  To no surprise, the PET scan shows that there is no evidence of residual disease.  I am very happy for him.  He also had an echocardiogram that was done which did not show any change in his cardiac ejection fraction.  I think his ejection fraction was 55-60%.  He still has a Parkinson's.  He is trying to exercise.  He is eating well.  He is having no problems with nausea or vomiting.  He has had no change in bowel or bladder habits.  We do have him on Neulasta.  This really has helped maintain his blood counts.  He has had no rashes.  There has been no leg swelling.  He has had no cough or shortness of breath.  Overall, I would say that his performance status is ECOG 1.     Medications:  Current Outpatient Medications:    acetaminophen (TYLENOL) 500 MG tablet, Take 1,000 mg by mouth every 8 (eight) hours as needed for moderate pain., Disp: , Rfl:    carbidopa-levodopa (SINEMET CR) 50-200 MG tablet, TAKE 1 TABLET BY MOUTH AT BEDTIME, Disp: 90 tablet, Rfl: 0   carbidopa-levodopa (SINEMET IR) 25-100 MG tablet, TAKE 2 TABLETS AT 7AM, 11AM, 3PM, AND AT 7PM DAILY. (Patient taking differently: TAKE 2 TABLETS AT 7AM, 11AM, 3PM.), Disp: 720 tablet, Rfl: 0   cholecalciferol (VITAMIN D3) 25 MCG (1000 UNIT) tablet, Take 1,000 Units by mouth daily., Disp: , Rfl:    dexamethasone (DECADRON) 4 MG tablet, Take 2 tablets (8 mg) by mouth daily for 3 days after chemotherapy. Take with food.,  Disp: 30 tablet, Rfl: 1   pramipexole (MIRAPEX) 0.5 MG tablet, TAKE 1 TABLET(0.5 MG) BY MOUTH THREE TIMES DAILY, Disp: 270 tablet, Rfl: 0   ondansetron (ZOFRAN) 8 MG tablet, Take 1 tablet (8 mg total) by mouth every 8 (eight) hours as needed for nausea or vomiting. Start on the third day after chemotherapy. (Patient not taking: Reported on 12/25/2022), Disp: 30 tablet, Rfl: 1   prochlorperazine (COMPAZINE) 10 MG tablet, Take 1 tablet (10 mg total) by mouth every 6 (six) hours as needed for nausea or vomiting. (Patient not taking: Reported on 11/09/2022), Disp: 30 tablet, Rfl: 1  Allergies: No Known Allergies  Past Medical History, Surgical history, Social history, and Family History were reviewed and updated.  Review of Systems: Review of Systems  Neurological:  Positive for tremors.  All other systems reviewed and are negative.    Physical Exam:  height is '5\' 10"'$  (1.778 m) and weight is 175 lb 1.9 oz (79.4 kg). His oral temperature is 98 F (36.7 C). His blood pressure is 128/74 and his pulse is 94. His respiration is 18 and oxygen saturation is 100%.   Physical Exam Vitals reviewed.  HENT:     Head: Normocephalic and atraumatic.  Eyes:     Pupils: Pupils are equal, round, and reactive to light.  Cardiovascular:     Rate and Rhythm: Normal rate and regular rhythm.     Heart sounds: Normal heart sounds.  Pulmonary:     Effort: Pulmonary effort is normal.     Breath sounds: Normal breath sounds.  Abdominal:     General: Bowel sounds are normal.     Palpations: Abdomen is soft.  Musculoskeletal:        General: No tenderness or deformity. Normal range of motion.     Cervical back: Normal range of motion.  Lymphadenopathy:     Cervical: No cervical adenopathy.  Skin:    General: Skin is warm and dry.     Findings: No erythema or rash.  Neurological:     Mental Status: He is alert and oriented to person, place, and time.  Psychiatric:        Behavior: Behavior normal.         Thought Content: Thought content normal.        Judgment: Judgment normal.     Lab Results  Component Value Date   WBC 8.1 12/27/2022   HGB 10.2 (L) 12/27/2022   HCT 32.0 (L) 12/27/2022   MCV 99.7 12/27/2022   PLT 318 12/27/2022     Chemistry      Component Value Date/Time   NA 139 12/27/2022 0820   NA 142 04/02/2017 0000   NA 140 07/19/2016 1305   K 4.4 12/27/2022 0820   K 4.4 07/19/2016 1305   CL 104 12/27/2022 0820   CL 105 02/08/2016 1117   CL 107 03/25/2013 0828   CO2 28 12/27/2022 0820   CO2 26 07/19/2016 1305   BUN 43 (H) 12/27/2022 0820   BUN 21 04/02/2017 0000   BUN 22.8 07/19/2016 1305   CREATININE 1.44 (H) 12/27/2022 0820   CREATININE 1.1 07/19/2016 1305   GLU 105 04/02/2017 0000      Component Value Date/Time   CALCIUM 9.4 12/27/2022 0820   CALCIUM 9.8 07/19/2016 1305   ALKPHOS 67 12/27/2022 0820   ALKPHOS 76 07/19/2016 1305   AST 12 (L) 12/27/2022 0820   AST 18 07/19/2016 1305   ALT <5 12/27/2022 0820   ALT 14 07/19/2016 1305   BILITOT 0.4 12/27/2022 0820   BILITOT 0.70 07/19/2016 1305      Impression and Plan: [name] is a 62 year old gentleman.  He has history of recurrent Hodgkin's disease. 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 He then had a recurrence after this.  He did undergo CAR-T therapy at [facility].  I believe he had this in April 2017.  He did well with this.  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  This was proven by biopsy.  He had radiation therapy for this.  He subsequently has had another recurrence.  This was in the right axilla.  This was biopsy-proven.  He did undergo radiation therapy for this.  This was completed in September/2023.  Now, we have another recurrence.  I really should not be surprised by this.  The PET scan clearly is indicative of a response.  We will continue him on therapy.  His cardiac status is stable.  He does get the cardioprotectant to try to help preserve his  ejection fraction.  I am just happy that he is responding.  I am happy that his quality of life is doing well.  We will plan to have him come back in 2 weeks again.   [name],  MD 2/1/20249:42 AM

## 2022-12-27 NOTE — Patient Instructions (Addendum)
[place]  Discharge Instructions: Thank you for choosing [facility] to provide your oncology and hematology care.   If you have a lab appointment with the [facility], please go directly to the [facility] and check in at the registration area.  Wear comfortable clothing and clothing appropriate for easy access to any Portacath or PICC line.   We strive to give you quality time with your provider. You may need to reschedule your appointment if you arrive late (15 or more minutes).  Arriving late affects you and other patients whose appointments are after yours.  Also, if you miss three or more appointments without notifying the office, you may be dismissed from the clinic at the provider's discretion.      For prescription refill requests, have your pharmacy contact our office and allow 72 hours for refills to be completed.    Today you received the following chemotherapy and/or immunotherapy agents [place]  Discharge Instructions: Thank you for choosing [facility] to provide your oncology and hematology care.   If you have a lab appointment with the [facility], please go directly to the [facility] and check in at the registration area.  Wear comfortable clothing and clothing appropriate for easy access to any Portacath or PICC line.   We strive to give you quality time with your provider. You may need to reschedule your appointment if you arrive late (15 or more minutes).  Arriving late affects you and other patients whose appointments are after yours.  Also, if you miss three or more appointments without notifying the office, you may be dismissed from the clinic at the provider's discretion.      For prescription refill requests, have your pharmacy contact our office and allow 72 hours for refills to be completed.    Today you received the following chemotherapy and/or  immunotherapy agents opdivo, adria, dtic, velban    To help prevent nausea and vomiting after your treatment, we encourage you to take your nausea medication as directed.  BELOW ARE SYMPTOMS THAT SHOULD BE REPORTED IMMEDIATELY: *FEVER GREATER THAN 100.4 F (38 C) OR HIGHER *CHILLS OR SWEATING *NAUSEA AND VOMITING THAT IS NOT CONTROLLED WITH YOUR NAUSEA MEDICATION *UNUSUAL SHORTNESS OF BREATH *UNUSUAL BRUISING OR BLEEDING *URINARY PROBLEMS (pain or burning when urinating, or frequent urination) *BOWEL PROBLEMS (unusual diarrhea, constipation, pain near the anus) TENDERNESS IN MOUTH AND THROAT WITH OR WITHOUT PRESENCE OF ULCERS (sore throat, sores in mouth, or a toothache) UNUSUAL RASH, SWELLING OR PAIN  UNUSUAL VAGINAL DISCHARGE OR ITCHING   Items with * indicate a potential emergency and should be followed up as soon as possible or go to the Emergency Department if any problems should occur.  Please show the CHEMOTHERAPY ALERT CARD or IMMUNOTHERAPY ALERT CARD at check-in to the Emergency Department and triage nurse. Should you have questions after your visit or need to cancel or reschedule your appointment, please contact [facility]  [phone] and follow the prompts.  Office hours are 8:00 a.m. to 4:30 p.m. Monday - Friday. Please note that voicemails left after 4:00 p.m. may not be returned until the following business day.  We are closed weekends and major holidays. You have access to a nurse at all times for urgent questions. Please call the main number to the clinic [phone] and follow the prompts.  For any non-urgent questions, you  may also contact your provider using MyChart. We now offer e-Visits for anyone 60 and older to request care online for non-urgent symptoms. For details visit [email or web address].   Also download the MyChart app! Go to the app store, search "MyChart", open the app, select [place], and log in with your MyChart  username and password.        To help prevent nausea and vomiting after your treatment, we encourage you to take your nausea medication as directed.  BELOW ARE SYMPTOMS THAT SHOULD BE REPORTED IMMEDIATELY: *FEVER GREATER THAN 100.4 F (38 C) OR HIGHER *CHILLS OR SWEATING *NAUSEA AND VOMITING THAT IS NOT CONTROLLED WITH YOUR NAUSEA MEDICATION *UNUSUAL SHORTNESS OF BREATH *UNUSUAL BRUISING OR BLEEDING *URINARY PROBLEMS (pain or burning when urinating, or frequent urination) *BOWEL PROBLEMS (unusual diarrhea, constipation, pain near the anus) TENDERNESS IN MOUTH AND THROAT WITH OR WITHOUT PRESENCE OF ULCERS (sore throat, sores in mouth, or a toothache) UNUSUAL RASH, SWELLING OR PAIN  UNUSUAL VAGINAL DISCHARGE OR ITCHING   Items with * indicate a potential emergency and should be followed up as soon as possible or go to the Emergency Department if any problems should occur.  Please show the CHEMOTHERAPY ALERT CARD or IMMUNOTHERAPY ALERT CARD at check-in to the Emergency Department and triage nurse. Should you have questions after your visit or need to cancel or reschedule your appointment, please contact [facility]  [phone] and follow the prompts.  Office hours are 8:00 a.m. to 4:30 p.m. Monday - Friday. Please note that voicemails left after 4:00 p.m. may not be returned until the following business day.  We are closed weekends and major holidays. You have access to a nurse at all times for urgent questions. Please call the main number to the clinic [phone] and follow the prompts.  For any non-urgent questions, you may also contact your provider using MyChart. We now offer e-Visits for anyone 60 and older to request care online for non-urgent symptoms. For details visit [email or web address].   Also download the MyChart app! Go to the app store, search "MyChart", open the app, select [facility], and log in with your MyChart username and  password.

## 2022-12-31 ENCOUNTER — Inpatient Hospital Stay: Payer: Commercial Managed Care - PPO

## 2022-12-31 VITALS — BP 121/72 | HR 103 | Temp 98.5°F | Resp 19

## 2022-12-31 DIAGNOSIS — C8195 Hodgkin lymphoma, unspecified, lymph nodes of inguinal region and lower limb: Secondary | ICD-10-CM

## 2022-12-31 DIAGNOSIS — Z5111 Encounter for antineoplastic chemotherapy: Secondary | ICD-10-CM | POA: Diagnosis not present

## 2022-12-31 MED ORDER — PEGFILGRASTIM-CBQV 6 MG/0.6ML ~~LOC~~ SOSY
6.0000 mg | PREFILLED_SYRINGE | Freq: Once | SUBCUTANEOUS | Status: AC
  Administered 2022-12-31: 6 mg via SUBCUTANEOUS
  Filled 2022-12-31: qty 0.6

## 2022-12-31 NOTE — Patient Instructions (Signed)

## 2023-01-01 NOTE — Progress Notes (Signed)
[employer] paper request for office notes for treatment from 07/18/2022 for date of disability, procedural/operative notes and medical tests results for time period.  Notes faxed with confirmation received.   Fax no. [phone]

## 2023-01-03 ENCOUNTER — Telehealth: Payer: Self-pay | Admitting: Physical Therapy

## 2023-01-03 ENCOUNTER — Other Ambulatory Visit: Payer: Self-pay

## 2023-01-03 DIAGNOSIS — G20B2 Parkinson's disease with dyskinesia, with fluctuations: Secondary | ICD-10-CM

## 2023-01-03 NOTE — Telephone Encounter (Signed)
Dr. Carles Collet,  Kellan Raffield is scheduled for  PT evaluation in March as recommended when he was last discharged from therapy due to progressive nature of diagnosis.  Pt was in agreement with this plan.  If you are in agreement, please send updated order for PT via epic.  Thank you,  Mady Haagensen, PT 01/03/23 1:41 PM Phone: [phone] Fax: [phone]   [facility] at [facility] [address], [place], [place] [address] Phone # [phone] Fax # [phone]

## 2023-01-11 ENCOUNTER — Inpatient Hospital Stay: Payer: Commercial Managed Care - PPO

## 2023-01-11 ENCOUNTER — Encounter: Payer: Self-pay | Admitting: Hematology & Oncology

## 2023-01-11 ENCOUNTER — Inpatient Hospital Stay (HOSPITAL_BASED_OUTPATIENT_CLINIC_OR_DEPARTMENT_OTHER): Payer: Commercial Managed Care - PPO | Admitting: Hematology & Oncology

## 2023-01-11 ENCOUNTER — Other Ambulatory Visit: Payer: Self-pay

## 2023-01-11 VITALS — BP 97/43 | HR 89 | Resp 17

## 2023-01-11 DIAGNOSIS — C8195 Hodgkin lymphoma, unspecified, lymph nodes of inguinal region and lower limb: Secondary | ICD-10-CM | POA: Diagnosis not present

## 2023-01-11 DIAGNOSIS — Z5111 Encounter for antineoplastic chemotherapy: Secondary | ICD-10-CM | POA: Diagnosis not present

## 2023-01-11 DIAGNOSIS — Z95828 Presence of other vascular implants and grafts: Secondary | ICD-10-CM

## 2023-01-11 LAB — CMP (CANCER CENTER ONLY)
ALT: <5 U/L (ref 0–44)
AST: 13 U/L — ABNORMAL LOW (ref 15–41)
Albumin: 3.9 g/dL (ref 3.5–5.0)
Alkaline Phosphatase: 104 U/L (ref 38–126)
Anion gap: 8 (ref 5–15)
BUN: 27 mg/dL — ABNORMAL HIGH (ref 8–23)
CO2: 28 mmol/L (ref 22–32)
Calcium: 9.4 mg/dL (ref 8.9–10.3)
Chloride: 102 mmol/L (ref 98–111)
Creatinine: 1.43 mg/dL — ABNORMAL HIGH (ref 0.61–1.24)
GFR, Estimated: 55 mL/min — ABNORMAL LOW (ref >60)
Glucose, Bld: 103 mg/dL — ABNORMAL HIGH (ref 70–99)
Potassium: 4.2 mmol/L (ref 3.5–5.1)
Sodium: 138 mmol/L (ref 135–145)
Total Bilirubin: 0.4 mg/dL (ref 0.3–1.2)
Total Protein: 6.6 g/dL (ref 6.5–8.1)

## 2023-01-11 LAB — CBC WITH DIFFERENTIAL (CANCER CENTER ONLY)
Abs Immature Granulocytes: 1.03 10*3/uL — ABNORMAL HIGH (ref 0.00–0.07)
Basophils Absolute: 0.1 10*3/uL (ref 0.0–0.1)
Basophils Relative: 1 %
Eosinophils Absolute: 0.2 10*3/uL (ref 0.0–0.5)
Eosinophils Relative: 2 %
HCT: 29.5 % — ABNORMAL LOW (ref 39.0–52.0)
Hemoglobin: 9.2 g/dL — ABNORMAL LOW (ref 13.0–17.0)
Immature Granulocytes: 8 %
Lymphocytes Relative: 5 %
Lymphs Abs: 0.7 10*3/uL (ref 0.7–4.0)
MCH: 31.3 pg (ref 26.0–34.0)
MCHC: 31.2 g/dL (ref 30.0–36.0)
MCV: 100.3 fL — ABNORMAL HIGH (ref 80.0–100.0)
Monocytes Absolute: 1.2 10*3/uL — ABNORMAL HIGH (ref 0.1–1.0)
Monocytes Relative: 9 %
Neutro Abs: 9.9 10*3/uL — ABNORMAL HIGH (ref 1.7–7.7)
Neutrophils Relative %: 75 %
Platelet Count: 255 10*3/uL (ref 150–400)
RBC: 2.94 MIL/uL — ABNORMAL LOW (ref 4.22–5.81)
RDW: 15.4 % (ref 11.5–15.5)
WBC Count: 13.1 10*3/uL — ABNORMAL HIGH (ref 4.0–10.5)
nRBC: 0.4 % — ABNORMAL HIGH (ref 0.0–0.2)

## 2023-01-11 LAB — LACTATE DEHYDROGENASE: LDH: 269 U/L — ABNORMAL HIGH (ref 98–192)

## 2023-01-11 MED ORDER — SODIUM CHLORIDE 0.9 % IV SOLN
375.0000 mg/m2 | Freq: Once | INTRAVENOUS | Status: AC
  Administered 2023-01-11: 800 mg via INTRAVENOUS
  Filled 2023-01-11: qty 80

## 2023-01-11 MED ORDER — VINBLASTINE SULFATE CHEMO INJECTION 1 MG/ML
6.0000 mg/m2 | Freq: Once | INTRAVENOUS | Status: AC
  Administered 2023-01-11: 11.9 mg via INTRAVENOUS
  Filled 2023-01-11: qty 11.9

## 2023-01-11 MED ORDER — LACTATED RINGERS IV SOLN
250.0000 mg/m2 | Freq: Once | INTRAVENOUS | Status: AC
  Administered 2023-01-11: 500 mg via INTRAVENOUS
  Filled 2023-01-11: qty 50

## 2023-01-11 MED ORDER — SODIUM CHLORIDE 0.9% FLUSH
10.0000 mL | INTRAVENOUS | Status: DC | PRN
  Administered 2023-01-11: 10 mL

## 2023-01-11 MED ORDER — HEPARIN SOD (PORK) LOCK FLUSH 100 UNIT/ML IV SOLN
500.0000 [IU] | Freq: Once | INTRAVENOUS | Status: AC | PRN
  Administered 2023-01-11: 500 [IU]

## 2023-01-11 MED ORDER — DOXORUBICIN HCL CHEMO IV INJECTION 2 MG/ML
25.0000 mg/m2 | Freq: Once | INTRAVENOUS | Status: AC
  Administered 2023-01-11: 50 mg via INTRAVENOUS
  Filled 2023-01-11: qty 25

## 2023-01-11 MED ORDER — SODIUM CHLORIDE 0.9% FLUSH
10.0000 mL | Freq: Once | INTRAVENOUS | Status: AC
  Administered 2023-01-11: 10 mL via INTRAVENOUS

## 2023-01-11 MED ORDER — DEXAMETHASONE 4 MG PO TABS: ORAL_TABLET | ORAL | 3 refills | Status: DC

## 2023-01-11 MED ORDER — SODIUM CHLORIDE 0.9 % IV SOLN: Freq: Once | INTRAVENOUS | Status: AC

## 2023-01-11 MED ORDER — DRONABINOL 5 MG PO CAPS: 5.0000 mg | ORAL_CAPSULE | Freq: Two times a day (BID) | ORAL | 0 refills | Status: DC

## 2023-01-11 MED ORDER — PALONOSETRON HCL INJECTION 0.25 MG/5ML
0.2500 mg | Freq: Once | INTRAVENOUS | Status: AC
  Administered 2023-01-11: 0.25 mg via INTRAVENOUS
  Filled 2023-01-11: qty 5

## 2023-01-11 MED ORDER — SODIUM CHLORIDE 0.9 % IV SOLN
150.0000 mg | Freq: Once | INTRAVENOUS | Status: AC
  Administered 2023-01-11: 150 mg via INTRAVENOUS
  Filled 2023-01-11: qty 150

## 2023-01-11 MED ORDER — SODIUM CHLORIDE 0.9 % IV SOLN
240.0000 mg | Freq: Once | INTRAVENOUS | Status: AC
  Administered 2023-01-11: 240 mg via INTRAVENOUS
  Filled 2023-01-11: qty 24

## 2023-01-11 MED ORDER — SODIUM CHLORIDE 0.9 % IV SOLN
10.0000 mg | Freq: Once | INTRAVENOUS | Status: AC
  Administered 2023-01-11: 10 mg via INTRAVENOUS
  Filled 2023-01-11: qty 10

## 2023-01-11 NOTE — Progress Notes (Signed)
Hematology and Oncology Follow Up Visit  Brian [name] [id number] 1961-06-17 62 y.o. 01/11/2023   Principle Diagnosis:  Recurrent Hodgkin's Disease -  S/p CAR-T therapy -- relapsed TIA-resolved  Current Therapy:  ANVD -- s/p cycle #2 -- start on 10/25/2022 Nivolumab q 3 month -- s/p cycle #18 -- on hold XRT -- [facility] --completed on 08/06/2022  Neulasta 6 mg IM post each chemotherapy  --on hold starting 01/11/2023      Interim History:  [name] is in for follow-up.  He is doing pretty well.  Again his last PET scan showed that he was in remission.  His main problem has been with the Neulasta.  He has a lot of arthralgias with the Neulasta.  He just does not feel all that great when he has the Neulasta.  His white cell count is doing quite nicely.  As such, I think we can try to hold the Neulasta.  His Parkinson's does not appear to be any worse.  He has had no problems with cardiac toxicity.  He is on the cardioprotective.  He has had no problems with cough.  He has had no nausea or vomiting.  He has had no change in bowel or bladder habits.  He has had no rashes.  Overall, I would say that his performance status is ECOG 1.    Medications:  Current Outpatient Medications:    acetaminophen (TYLENOL) 500 MG tablet, Take 1,000 mg by mouth every 8 (eight) hours as needed for moderate pain., Disp: , Rfl:    carbidopa-levodopa (SINEMET CR) 50-200 MG tablet, TAKE 1 TABLET BY MOUTH AT BEDTIME, Disp: 90 tablet, Rfl: 0   carbidopa-levodopa (SINEMET IR) 25-100 MG tablet, TAKE 2 TABLETS AT 7AM, 11AM, 3PM, AND AT 7PM DAILY. (Patient taking differently: TAKE 2 TABLETS AT 7AM, 11AM, 3PM.), Disp: 720 tablet, Rfl: 0   cholecalciferol (VITAMIN D3) 25 MCG (1000 UNIT) tablet, Take 1,000 Units by mouth daily., Disp: , Rfl:    dexamethasone (DECADRON) 4 MG tablet, Take 2 tablets (8 mg) by mouth daily for 3 days after chemotherapy. Take with food., Disp: 30 tablet, Rfl: 1   ondansetron (ZOFRAN) 8  MG tablet, Take 1 tablet (8 mg total) by mouth every 8 (eight) hours as needed for nausea or vomiting. Start on the third day after chemotherapy. (Patient not taking: Reported on 12/25/2022), Disp: 30 tablet, Rfl: 1   pramipexole (MIRAPEX) 0.5 MG tablet, TAKE 1 TABLET(0.5 MG) BY MOUTH THREE TIMES DAILY, Disp: 270 tablet, Rfl: 0   prochlorperazine (COMPAZINE) 10 MG tablet, Take 1 tablet (10 mg total) by mouth every 6 (six) hours as needed for nausea or vomiting. (Patient not taking: Reported on 11/09/2022), Disp: 30 tablet, Rfl: 1  Allergies: No Known Allergies  Past Medical History, Surgical history, Social history, and Family History were reviewed and updated.  Review of Systems: Review of Systems  Neurological:  Positive for tremors.  All other systems reviewed and are negative.    Physical Exam:  height is 5' 10"$  (1.778 m) and weight is 171 lb (77.6 kg). His oral temperature is 98.7 F (37.1 C). His blood pressure is 104/68 and his pulse is 82. His respiration is 18 and oxygen saturation is 98%.   Physical Exam Vitals reviewed.  HENT:     Head: Normocephalic and atraumatic.  Eyes:     Pupils: Pupils are equal, round, and reactive to light.  Cardiovascular:     Rate and Rhythm: Normal rate and regular  rhythm.     Heart sounds: Normal heart sounds.  Pulmonary:     Effort: Pulmonary effort is normal.     Breath sounds: Normal breath sounds.  Abdominal:     General: Bowel sounds are normal.     Palpations: Abdomen is soft.  Musculoskeletal:        General: No tenderness or deformity. Normal range of motion.     Cervical back: Normal range of motion.  Lymphadenopathy:     Cervical: No cervical adenopathy.  Skin:    General: Skin is warm and dry.     Findings: No erythema or rash.  Neurological:     Mental Status: He is alert and oriented to person, place, and time.  Psychiatric:        Behavior: Behavior normal.        Thought Content: Thought content normal.         Judgment: Judgment normal.     Lab Results  Component Value Date   WBC 13.1 (H) 01/11/2023   HGB 9.2 (L) 01/11/2023   HCT 29.5 (L) 01/11/2023   MCV 100.3 (H) 01/11/2023   PLT 255 01/11/2023     Chemistry      Component Value Date/Time   NA 139 12/27/2022 0820   NA 142 04/02/2017 0000   NA 140 07/19/2016 1305   K 4.4 12/27/2022 0820   K 4.4 07/19/2016 1305   CL 104 12/27/2022 0820   CL 105 02/08/2016 1117   CL 107 03/25/2013 0828   CO2 28 12/27/2022 0820   CO2 26 07/19/2016 1305   BUN 43 (H) 12/27/2022 0820   BUN 21 04/02/2017 0000   BUN 22.8 07/19/2016 1305   CREATININE 1.44 (H) 12/27/2022 0820   CREATININE 1.1 07/19/2016 1305   GLU 105 04/02/2017 0000      Component Value Date/Time   CALCIUM 9.4 12/27/2022 0820   CALCIUM 9.8 07/19/2016 1305   ALKPHOS 67 12/27/2022 0820   ALKPHOS 76 07/19/2016 1305   AST 12 (L) 12/27/2022 0820   AST 18 07/19/2016 1305   ALT <5 12/27/2022 0820   ALT 14 07/19/2016 1305   BILITOT 0.4 12/27/2022 0820   BILITOT 0.70 07/19/2016 1305      Impression and Plan: Brian [name] is a 62 year old gentleman.  He has history of recurrent Hodgkin's disease. 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 He then had a recurrence after this.  He did undergo CAR-T therapy at [facility].  I believe he had this in April 2017.  He did well with this.  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  This was proven by biopsy.  He had radiation therapy for this.  He subsequently has had another recurrence.  This was in the right axilla.  This was biopsy-proven.  He did undergo radiation therapy for this.  This was completed in September/2023.  Now, we have another recurrence.  I really should not be surprised by this.  He currently is on chemotherapy with ANVB.  I think he is doing well with this.  Think that our plan is probably for a total of 6 cycles of treatment.  After that, we might just be able to go back with the  immunotherapy.  We will plan for another follow-up in 2 weeks.  We will hold on his Neulasta.   [name], MD 2/16/20248:34 AM

## 2023-01-11 NOTE — Addendum Note (Signed)
Addended by: Burney Gauze R on: 01/11/2023 09:03 AM   Modules accepted: Orders

## 2023-01-11 NOTE — Patient Instructions (Addendum)
[place]  Discharge Instructions: Thank you for choosing [facility] to provide your oncology and hematology care.   If you have a lab appointment with the [facility], please go directly to the [facility] and check in at the registration area.  Wear comfortable clothing and clothing appropriate for easy access to any Portacath or PICC line.   We strive to give you quality time with your provider. You may need to reschedule your appointment if you arrive late (15 or more minutes).  Arriving late affects you and other patients whose appointments are after yours.  Also, if you miss three or more appointments without notifying the office, you may be dismissed from the clinic at the provider's discretion.      For prescription refill requests, have your pharmacy contact our office and allow 72 hours for refills to be completed.    Today you received the following chemotherapy and/or immunotherapy agents Adriamycin, Dacarbazine, Velban, Opdivo      To help prevent nausea and vomiting after your treatment, we encourage you to take your nausea medication as directed.  BELOW ARE SYMPTOMS THAT SHOULD BE REPORTED IMMEDIATELY: *FEVER GREATER THAN 100.4 F (38 C) OR HIGHER *CHILLS OR SWEATING *NAUSEA AND VOMITING THAT IS NOT CONTROLLED WITH YOUR NAUSEA MEDICATION *UNUSUAL SHORTNESS OF BREATH *UNUSUAL BRUISING OR BLEEDING *URINARY PROBLEMS (pain or burning when urinating, or frequent urination) *BOWEL PROBLEMS (unusual diarrhea, constipation, pain near the anus) TENDERNESS IN MOUTH AND THROAT WITH OR WITHOUT PRESENCE OF ULCERS (sore throat, sores in mouth, or a toothache) UNUSUAL RASH, SWELLING OR PAIN  UNUSUAL VAGINAL DISCHARGE OR ITCHING   Items with * indicate a potential emergency and should be followed up as soon as possible or go to the Emergency Department if any problems should occur.  Please show the CHEMOTHERAPY ALERT CARD or  IMMUNOTHERAPY ALERT CARD at check-in to the Emergency Department and triage nurse. Should you have questions after your visit or need to cancel or reschedule your appointment, please contact [facility]  [phone] and follow the prompts.  Office hours are 8:00 a.m. to 4:30 p.m. Monday - Friday. Please note that voicemails left after 4:00 p.m. may not be returned until the following business day.  We are closed weekends and major holidays. You have access to a nurse at all times for urgent questions. Please call the main number to the clinic [phone] and follow the prompts.  For any non-urgent questions, you may also contact your provider using MyChart. We now offer e-Visits for anyone 69 and older to request care online for non-urgent symptoms. For details visit [email or web address].   Also download the MyChart app! Go to the app store, search "MyChart", open the app, select [facility], and log in with your MyChart username and password.  Masks are optional in the cancer centers. If you would like for your care team to wear a mask while they are taking care of you, please let them know. You may have one support person who is at least 63 years old accompany you for your appointments.

## 2023-01-14 ENCOUNTER — Encounter: Payer: Self-pay | Admitting: Hematology & Oncology

## 2023-01-15 ENCOUNTER — Other Ambulatory Visit: Payer: Self-pay

## 2023-01-15 DIAGNOSIS — C8195 Hodgkin lymphoma, unspecified, lymph nodes of inguinal region and lower limb: Secondary | ICD-10-CM

## 2023-01-15 MED ORDER — DRONABINOL 5 MG PO CAPS: 5.0000 mg | ORAL_CAPSULE | Freq: Two times a day (BID) | ORAL | 0 refills | Status: DC

## 2023-01-21 ENCOUNTER — Encounter: Payer: Self-pay | Admitting: Hematology & Oncology

## 2023-01-21 DIAGNOSIS — R21 Rash and other nonspecific skin eruption: Secondary | ICD-10-CM

## 2023-01-21 DIAGNOSIS — C8195 Hodgkin lymphoma, unspecified, lymph nodes of inguinal region and lower limb: Secondary | ICD-10-CM

## 2023-01-21 MED ORDER — DOXYCYCLINE HYCLATE 100 MG PO TABS: 100.0000 mg | ORAL_TABLET | Freq: Two times a day (BID) | ORAL | 0 refills | Status: AC

## 2023-01-23 ENCOUNTER — Encounter: Payer: Self-pay | Admitting: Hematology & Oncology

## 2023-01-25 ENCOUNTER — Encounter: Payer: Self-pay | Admitting: Hematology & Oncology

## 2023-01-25 ENCOUNTER — Inpatient Hospital Stay: Payer: Commercial Managed Care - PPO

## 2023-01-25 ENCOUNTER — Other Ambulatory Visit: Payer: Self-pay

## 2023-01-25 ENCOUNTER — Inpatient Hospital Stay (HOSPITAL_BASED_OUTPATIENT_CLINIC_OR_DEPARTMENT_OTHER): Payer: Commercial Managed Care - PPO | Admitting: Hematology & Oncology

## 2023-01-25 ENCOUNTER — Inpatient Hospital Stay: Payer: Commercial Managed Care - PPO | Attending: Hematology & Oncology

## 2023-01-25 ENCOUNTER — Encounter: Payer: Self-pay | Admitting: *Deleted

## 2023-01-25 VITALS — BP 130/80 | HR 101

## 2023-01-25 VITALS — BP 129/78 | HR 100 | Temp 97.6°F | Resp 17 | Ht 70.0 in | Wt 170.0 lb

## 2023-01-25 DIAGNOSIS — Z5111 Encounter for antineoplastic chemotherapy: Secondary | ICD-10-CM | POA: Insufficient documentation

## 2023-01-25 DIAGNOSIS — C8195 Hodgkin lymphoma, unspecified, lymph nodes of inguinal region and lower limb: Secondary | ICD-10-CM

## 2023-01-25 DIAGNOSIS — G20A1 Parkinson's disease without dyskinesia, without mention of fluctuations: Secondary | ICD-10-CM | POA: Insufficient documentation

## 2023-01-25 DIAGNOSIS — C8198 Hodgkin lymphoma, unspecified, lymph nodes of multiple sites: Secondary | ICD-10-CM | POA: Insufficient documentation

## 2023-01-25 DIAGNOSIS — Z9484 Stem cells transplant status: Secondary | ICD-10-CM | POA: Insufficient documentation

## 2023-01-25 DIAGNOSIS — Z923 Personal history of irradiation: Secondary | ICD-10-CM | POA: Insufficient documentation

## 2023-01-25 DIAGNOSIS — Z79899 Other long term (current) drug therapy: Secondary | ICD-10-CM | POA: Insufficient documentation

## 2023-01-25 LAB — CBC WITH DIFFERENTIAL (CANCER CENTER ONLY)
Abs Immature Granulocytes: 0.01 10*3/uL (ref 0.00–0.07)
Basophils Absolute: 0 10*3/uL (ref 0.0–0.1)
Basophils Relative: 2 %
Eosinophils Absolute: 0 10*3/uL (ref 0.0–0.5)
Eosinophils Relative: 1 %
HCT: 28.7 % — ABNORMAL LOW (ref 39.0–52.0)
Hemoglobin: 9.1 g/dL — ABNORMAL LOW (ref 13.0–17.0)
Immature Granulocytes: 1 %
Lymphocytes Relative: 37 %
Lymphs Abs: 0.8 10*3/uL (ref 0.7–4.0)
MCH: 31.6 pg (ref 26.0–34.0)
MCHC: 31.7 g/dL (ref 30.0–36.0)
MCV: 99.7 fL (ref 80.0–100.0)
Monocytes Absolute: 0.6 10*3/uL (ref 0.1–1.0)
Monocytes Relative: 31 %
Neutro Abs: 0.6 10*3/uL — ABNORMAL LOW (ref 1.7–7.7)
Neutrophils Relative %: 28 %
Platelet Count: 256 10*3/uL (ref 150–400)
RBC: 2.88 MIL/uL — ABNORMAL LOW (ref 4.22–5.81)
RDW: 15.8 % — ABNORMAL HIGH (ref 11.5–15.5)
WBC Count: 2.1 10*3/uL — ABNORMAL LOW (ref 4.0–10.5)
nRBC: 0 % (ref 0.0–0.2)

## 2023-01-25 LAB — CMP (CANCER CENTER ONLY)
ALT: <5 U/L (ref 0–44)
AST: 11 U/L — ABNORMAL LOW (ref 15–41)
Albumin: 4.3 g/dL (ref 3.5–5.0)
Alkaline Phosphatase: 75 U/L (ref 38–126)
Anion gap: 9 (ref 5–15)
BUN: 45 mg/dL — ABNORMAL HIGH (ref 8–23)
CO2: 24 mmol/L (ref 22–32)
Calcium: 9.6 mg/dL (ref 8.9–10.3)
Chloride: 107 mmol/L (ref 98–111)
Creatinine: 1.34 mg/dL — ABNORMAL HIGH (ref 0.61–1.24)
GFR, Estimated: 60 mL/min — ABNORMAL LOW (ref >60)
Glucose, Bld: 99 mg/dL (ref 70–99)
Potassium: 4.1 mmol/L (ref 3.5–5.1)
Sodium: 140 mmol/L (ref 135–145)
Total Bilirubin: 0.4 mg/dL (ref 0.3–1.2)
Total Protein: 6.2 g/dL — ABNORMAL LOW (ref 6.5–8.1)

## 2023-01-25 MED ORDER — DOXORUBICIN HCL CHEMO IV INJECTION 2 MG/ML
25.0000 mg/m2 | Freq: Once | INTRAVENOUS | Status: AC
  Administered 2023-01-25: 50 mg via INTRAVENOUS
  Filled 2023-01-25: qty 25

## 2023-01-25 MED ORDER — VINBLASTINE SULFATE CHEMO INJECTION 1 MG/ML
6.0000 mg/m2 | Freq: Once | INTRAVENOUS | Status: AC
  Administered 2023-01-25: 11.9 mg via INTRAVENOUS
  Filled 2023-01-25: qty 11.9

## 2023-01-25 MED ORDER — LACTATED RINGERS IV SOLN
250.0000 mg/m2 | Freq: Once | INTRAVENOUS | Status: AC
  Administered 2023-01-25: 500 mg via INTRAVENOUS
  Filled 2023-01-25: qty 50

## 2023-01-25 MED ORDER — SODIUM CHLORIDE 0.9% FLUSH
10.0000 mL | INTRAVENOUS | Status: DC | PRN
  Administered 2023-01-25: 10 mL

## 2023-01-25 MED ORDER — SODIUM CHLORIDE 0.9 % IV SOLN
240.0000 mg | Freq: Once | INTRAVENOUS | Status: AC
  Administered 2023-01-25: 240 mg via INTRAVENOUS
  Filled 2023-01-25: qty 24

## 2023-01-25 MED ORDER — SODIUM CHLORIDE 0.9 % IV SOLN
10.0000 mg | Freq: Once | INTRAVENOUS | Status: AC
  Administered 2023-01-25: 10 mg via INTRAVENOUS
  Filled 2023-01-25: qty 10

## 2023-01-25 MED ORDER — SODIUM CHLORIDE 0.9 % IV SOLN
375.0000 mg/m2 | Freq: Once | INTRAVENOUS | Status: AC
  Administered 2023-01-25: 800 mg via INTRAVENOUS
  Filled 2023-01-25: qty 80

## 2023-01-25 MED ORDER — SODIUM CHLORIDE 0.9 % IV SOLN: Freq: Once | INTRAVENOUS | Status: AC

## 2023-01-25 MED ORDER — HEPARIN SOD (PORK) LOCK FLUSH 100 UNIT/ML IV SOLN
500.0000 [IU] | Freq: Once | INTRAVENOUS | Status: AC | PRN
  Administered 2023-01-25: 500 [IU]

## 2023-01-25 MED ORDER — SODIUM CHLORIDE 0.9 % IV SOLN
150.0000 mg | Freq: Once | INTRAVENOUS | Status: AC
  Administered 2023-01-25: 150 mg via INTRAVENOUS
  Filled 2023-01-25: qty 150

## 2023-01-25 MED ORDER — PALONOSETRON HCL INJECTION 0.25 MG/5ML
0.2500 mg | Freq: Once | INTRAVENOUS | Status: AC
  Administered 2023-01-25: 0.25 mg via INTRAVENOUS
  Filled 2023-01-25: qty 5

## 2023-01-25 NOTE — Addendum Note (Signed)
Addended by: Burney Gauze R on: 01/25/2023 10:27 AM   Modules accepted: Orders

## 2023-01-25 NOTE — Progress Notes (Signed)
Labs reviewed by MD VO"Ok to treat despite counts. MD will enter orders for injection 3/4 to boost counts. Msg to scheduling for an appt

## 2023-01-25 NOTE — Patient Instructions (Signed)

## 2023-01-25 NOTE — Patient Instructions (Addendum)
[facility] [place]  Discharge Instructions: Thank you for choosing [facility] to provide your oncology and hematology care.   If you have a lab appointment with the [facility], please go directly to the [facility] and check in at the registration area.  Wear comfortable clothing and clothing appropriate for easy access to any Portacath or PICC line.   We strive to give you quality time with your provider. You may need to reschedule your appointment if you arrive late (15 or more minutes).  Arriving late affects you and other patients whose appointments are after yours.  Also, if you miss three or more appointments without notifying the office, you may be dismissed from the clinic at the provider's discretion.      For prescription refill requests, have your pharmacy contact our office and allow 72 hours for refills to be completed.    Today you received the following chemotherapy and/or immunotherapy agents abvd, zinecard, opdivo      To help prevent nausea and vomiting after your treatment, we encourage you to take your nausea medication as directed.  BELOW ARE SYMPTOMS THAT SHOULD BE REPORTED IMMEDIATELY: *FEVER GREATER THAN 100.4 F (38 C) OR HIGHER *CHILLS OR SWEATING *NAUSEA AND VOMITING THAT IS NOT CONTROLLED WITH YOUR NAUSEA MEDICATION *UNUSUAL SHORTNESS OF BREATH *UNUSUAL BRUISING OR BLEEDING *URINARY PROBLEMS (pain or burning when urinating, or frequent urination) *BOWEL PROBLEMS (unusual diarrhea, constipation, pain near the anus) TENDERNESS IN MOUTH AND THROAT WITH OR WITHOUT PRESENCE OF ULCERS (sore throat, sores in mouth, or a toothache) UNUSUAL RASH, SWELLING OR PAIN  UNUSUAL VAGINAL DISCHARGE OR ITCHING   Items with * indicate a potential emergency and should be followed up as soon as possible or go to the Emergency Department if any problems should occur.  Please show the CHEMOTHERAPY ALERT CARD or IMMUNOTHERAPY ALERT  CARD at check-in to the Emergency Department and triage nurse. Should you have questions after your visit or need to cancel or reschedule your appointment, please contact [facility]  [phone] and follow the prompts.  Office hours are 8:00 a.m. to 4:30 p.m. Monday - Friday. Please note that voicemails left after 4:00 p.m. may not be returned until the following business day.  We are closed weekends and major holidays. You have access to a nurse at all times for urgent questions. Please call the main number to the clinic [phone] and follow the prompts.  For any non-urgent questions, you may also contact your provider using MyChart. We now offer e-Visits for anyone 76 and older to request care online for non-urgent symptoms. For details visit [email or web address].   Also download the MyChart app! Go to the app store, search "MyChart", open the app, select [facility], and log in with your MyChart username and password.

## 2023-01-25 NOTE — Progress Notes (Signed)
Hematology and Oncology Follow Up Visit  Brian [name] [id number] 03-23-1961 62 y.o. 01/25/2023   Principle Diagnosis:  Recurrent Hodgkin's Disease -  S/p CAR-T therapy -- relapsed TIA-resolved  Current Therapy:  ANVD -- s/p cycle #3 -- start on 10/25/2022 Nivolumab q 3 month -- s/p cycle #18 -- on hold XRT -- Involved field --completed on 08/06/2022  Neulasta 6 mg IM post each chemotherapy       Interim History:  [name] is in for follow-up.  The only problem that he really has had has been his face.  We did give him some doxycycline.  This worked very nicely.  He will need to have Neulasta.  His ANC is only 600.  I think we still treat him today although he is going need to have Neulasta.  He has had no fever.  He has had no bleeding.  There is no change in bowel or bladder habits.  He still is dealing with his Parkinson's.  He has had no problems with swollen lymph nodes.  He has had no bony pain.  He is trying to stay active.  Overall, I would say that his performance status is probably ECOG 1.     Medications:  Current Outpatient Medications:    diphenhydramine-acetaminophen (TYLENOL PM) 25-500 MG TABS tablet, Take 1 tablet by mouth at bedtime as needed., Disp: , Rfl:    carbidopa-levodopa (SINEMET CR) 50-200 MG tablet, TAKE 1 TABLET BY MOUTH AT BEDTIME, Disp: 90 tablet, Rfl: 0   carbidopa-levodopa (SINEMET IR) 25-100 MG tablet, TAKE 2 TABLETS AT 7AM, 11AM, 3PM, AND AT 7PM DAILY. (Patient taking differently: TAKE 2 TABLETS AT 7AM, 11AM, 3PM.), Disp: 720 tablet, Rfl: 0   cholecalciferol (VITAMIN D3) 25 MCG (1000 UNIT) tablet, Take 1,000 Units by mouth daily., Disp: , Rfl:    dexamethasone (DECADRON) 4 MG tablet, Take 2 tablets (8 mg) by mouth daily for 3 days after chemotherapy. Take with food., Disp: 90 tablet, Rfl: 3   doxycycline (VIBRA-TABS) 100 MG tablet, Take 1 tablet (100 mg total) by mouth 2 (two) times daily for 10 days., Disp: 20 tablet, Rfl: 0   dronabinol  (MARINOL) 5 MG capsule, Take 1 capsule (5 mg total) by mouth 2 (two) times daily before a meal., Disp: 60 capsule, Rfl: 0   pramipexole (MIRAPEX) 0.5 MG tablet, TAKE 1 TABLET(0.5 MG) BY MOUTH THREE TIMES DAILY, Disp: 270 tablet, Rfl: 0  Allergies: No Known Allergies  Past Medical History, Surgical history, Social history, and Family History were reviewed and updated.  Review of Systems: Review of Systems  Neurological:  Positive for tremors.  All other systems reviewed and are negative.    Physical Exam:  height is '5\' 10"'$  (1.778 m) and weight is 170 lb (77.1 kg). His oral temperature is 97.6 F (36.4 C). His blood pressure is 129/78 and his pulse is 100. His respiration is 17 and oxygen saturation is 100%.   Physical Exam Vitals reviewed.  HENT:     Head: Normocephalic and atraumatic.  Eyes:     Pupils: Pupils are equal, round, and reactive to light.  Cardiovascular:     Rate and Rhythm: Normal rate and regular rhythm.     Heart sounds: Normal heart sounds.  Pulmonary:     Effort: Pulmonary effort is normal.     Breath sounds: Normal breath sounds.  Abdominal:     General: Bowel sounds are normal.     Palpations: Abdomen is soft.  Musculoskeletal:  General: No tenderness or deformity. Normal range of motion.     Cervical back: Normal range of motion.  Lymphadenopathy:     Cervical: No cervical adenopathy.  Skin:    General: Skin is warm and dry.     Findings: No erythema or rash.  Neurological:     Mental Status: He is alert and oriented to person, place, and time.  Psychiatric:        Behavior: Behavior normal.        Thought Content: Thought content normal.        Judgment: Judgment normal.     Lab Results  Component Value Date   WBC 2.1 (L) 01/25/2023   HGB 9.1 (L) 01/25/2023   HCT 28.7 (L) 01/25/2023   MCV 99.7 01/25/2023   PLT 256 01/25/2023     Chemistry      Component Value Date/Time   NA 138 01/11/2023 0810   NA 142 04/02/2017 0000   NA 140  07/19/2016 1305   K 4.2 01/11/2023 0810   K 4.4 07/19/2016 1305   CL 102 01/11/2023 0810   CL 105 02/08/2016 1117   CL 107 03/25/2013 0828   CO2 28 01/11/2023 0810   CO2 26 07/19/2016 1305   BUN 27 (H) 01/11/2023 0810   BUN 21 04/02/2017 0000   BUN 22.8 07/19/2016 1305   CREATININE 1.43 (H) 01/11/2023 0810   CREATININE 1.1 07/19/2016 1305   GLU 105 04/02/2017 0000      Component Value Date/Time   CALCIUM 9.4 01/11/2023 0810   CALCIUM 9.8 07/19/2016 1305   ALKPHOS 104 01/11/2023 0810   ALKPHOS 76 07/19/2016 1305   AST 13 (L) 01/11/2023 0810   AST 18 07/19/2016 1305   ALT <5 01/11/2023 0810   ALT 14 07/19/2016 1305   BILITOT 0.4 01/11/2023 0810   BILITOT 0.70 07/19/2016 1305      Impression and Plan: Brian [name] is a 62 year old gentleman.  He has history of recurrent Hodgkin's disease. 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 He then had a recurrence after this.  He did undergo CAR[facility]T therapy at [facility].  I believe he had this in April 2017.  He did well with this.  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  This was proven by biopsy.  He had radiation therapy for this.  He subsequently has had another recurrence.  This was in the right axilla.  This was biopsy-proven.  He did undergo radiation therapy for this.  This was completed in September/2023.  Now, we have another recurrence.  I really should not be surprised by this.  He currently is on chemotherapy with ANVB.  I think he is doing well with this.  His last PET scan showed that he was in remission.  We will go ahead with his fourth cycle of treatment.  After this fourth cycle, we will then plan for a PET scan.     [name], MD 3/1/20249:23 AM

## 2023-01-25 NOTE — Progress Notes (Signed)
[facility]  Clinical Social Work was referred by new patient protocol for assessment of psychosocial needs.  Clinical Social Worker contacted patient by phone  to offer support and assess for needs.    Patient recently retired and will be utilizing his [employer].  It has not been activated yet, which has caused him some concern.  He was able to track the form and payment down, which has not been delivered to the appropriate company yet.  Patient stated he has no other needs at this time and that he is financial secure.     Margaree Mackintosh, LCSW  Clinical Social Worker [facility]

## 2023-01-28 ENCOUNTER — Encounter: Payer: Self-pay | Admitting: Hematology & Oncology

## 2023-01-28 ENCOUNTER — Inpatient Hospital Stay: Payer: Commercial Managed Care - PPO

## 2023-01-28 VITALS — BP 106/63 | HR 89 | Temp 97.6°F | Resp 18

## 2023-01-28 DIAGNOSIS — Z5111 Encounter for antineoplastic chemotherapy: Secondary | ICD-10-CM | POA: Diagnosis not present

## 2023-01-28 DIAGNOSIS — C8195 Hodgkin lymphoma, unspecified, lymph nodes of inguinal region and lower limb: Secondary | ICD-10-CM

## 2023-01-28 MED ORDER — PEGFILGRASTIM-CBQV 6 MG/0.6ML ~~LOC~~ SOSY
6.0000 mg | PREFILLED_SYRINGE | Freq: Once | SUBCUTANEOUS | Status: AC
  Administered 2023-01-28: 6 mg via SUBCUTANEOUS
  Filled 2023-01-28: qty 0.6

## 2023-01-28 NOTE — Patient Instructions (Signed)

## 2023-01-31 ENCOUNTER — Other Ambulatory Visit: Payer: Self-pay | Admitting: Neurology

## 2023-01-31 DIAGNOSIS — G20A1 Parkinson's disease without dyskinesia, without mention of fluctuations: Secondary | ICD-10-CM

## 2023-02-05 ENCOUNTER — Ambulatory Visit: Payer: Commercial Managed Care - PPO | Attending: Neurology | Admitting: Physical Therapy

## 2023-02-05 ENCOUNTER — Other Ambulatory Visit: Payer: Self-pay

## 2023-02-05 ENCOUNTER — Encounter: Payer: Self-pay | Admitting: Physical Therapy

## 2023-02-05 DIAGNOSIS — G20B2 Parkinson's disease with dyskinesia, with fluctuations: Secondary | ICD-10-CM | POA: Diagnosis not present

## 2023-02-05 DIAGNOSIS — R2689 Other abnormalities of gait and mobility: Secondary | ICD-10-CM | POA: Diagnosis present

## 2023-02-05 DIAGNOSIS — R2681 Unsteadiness on feet: Secondary | ICD-10-CM | POA: Diagnosis present

## 2023-02-05 DIAGNOSIS — M6281 Muscle weakness (generalized): Secondary | ICD-10-CM

## 2023-02-05 NOTE — Therapy (Signed)
OUTPATIENT PHYSICAL THERAPY NEURO EVALUATION   Patient Name: Brian [name] MRN: [id number] DOB:1961-06-24, 62 y.o., male Today's Date: 02/05/2023   PCP: Lamar Blinks, MD REFERRING PROVIDER: Alonza Bogus, DO  END OF SESSION:  PT End of Session - 02/05/23 1022     Visit Number 1    Number of Visits 5    Date for PT Re-Evaluation 05/03/23    Authorization Type [employer] (from [facility])    PT Start Time 1023    PT Stop Time 1105    PT Time Calculation (min) 42 min    Activity Tolerance Patient tolerated treatment well    Behavior During Therapy WFL for tasks assessed/performed             Past Medical History:  Diagnosis Date   Anxiety    Dysrhythmia    past hx pvc   Goals of care, counseling/discussion 09/25/2018   H/O autologous stem cell transplant ([place])    may 2014  at [facility]   History of Bell's palsy    2009  RIGHT SIDE--  HAS 80% FUNCTION / PT STATES A LITTLE ASYMETRICAL AND EFFECTS MOUTH   History of radiation therapy 10/18/17-11/05/17   sprine T1 26 Gy in 13 fractions, spine boost 10 Gy in 5 fractions   History of radiation therapy    Right hip, Rt Sclav- 07/23/22-08/07/11- Dr. Gery Pray   Hodgkin's disease, nodular sclerosis, of inguinal region/lower limb ([place]) ONOLOGIST--  DR Marin Olp AND A [facility]     SALVAGE CHEMO 2013/  AUTOLOGUS STEM CELL TRANSPLANT MAY 2014 AT [facility]   Hypertension    Mass of right chest wall 09/18/2018   Mass of right inguinal region    Neuromuscular disorder ([place])    bilateral feet neuropathy   PVC (premature ventricular contraction)    "benign"   TIA (transient ischemic attack) 02/2015   "probable TIA"   Wears contact lenses    Past Surgical History:  Procedure Laterality Date   AXILLARY LYMPH NODE BIOPSY Left 02/02/2013   Procedure: NEEDLE LOCALIZED AXILLARY LYMPH NODE BIOPSY;  Surgeon: Haywood Lasso, MD;  Location: [place];  Service: General;  Laterality: Left;   BACK SURGERY  03/2021   BONE MARROW BIOPSY   08/26/2012   COLONOSCOPY  2021   CYST REMOVAL NECK Left 11/26/1978   IR IMAGING GUIDED PORT INSERTION  10/23/2022   LEFT INGUINAL LYMPH NODE BX  09/09/2012   LYMPH NODE BIOPSY N/A 07/28/2015   Procedure: LYMPH NODE BIOPSY;  Surgeon: Melrose Nakayama, MD;  Location: [place];  Service: Thoracic;  Laterality: N/A;   LYMPH NODE BIOPSY Right 06/29/2022   Procedure: RIGHT AXILLARY NODE BIOPSY;  Surgeon: Melrose Nakayama, MD;  Location: [place];  Service: Vascular;  Laterality: Right;   MASS EXCISION Right 09/19/2018   Procedure: EXCISION OF RIGHT CHEST WALL MASS ERAS PATHWAY;  Surgeon: Fanny Skates, MD;  Location: [place];  Service: General;  Laterality: Right;   NODE DISSECTION Right 07/28/2015   Procedure: NODE DISSECTION;  Surgeon: Melrose Nakayama, MD;  Location: [place];  Service: Thoracic;  Laterality: Right;   PLEURA BIOPSY Left 05/31/2014   REMOVAL RIGHT INGUINAL LYMPH NODES  08/16/2011   SCROTAL EXPLORATION Right 01/04/2014   Procedure: SCROTUM EXPLORATION   INGUINAL , EXCISION OF CYSTIC MASS OF RIGHT SPERMATIC CORD, [place];  Surgeon: Franchot Gallo, MD;  Location: [facility];  Service: Urology;  Laterality: Right;   TRANSTHORACIC ECHOCARDIOGRAM  03/18/2013  MILD LVH/  EF 55-60%   VIDEO ASSISTED THORACOSCOPY Left 05/31/2014   Procedure: LEFT VIDEO ASSISTED THORACOSCOPY, PLEURAL BIOPSY;  Surgeon: Melrose Nakayama, MD;  Location: [place];  Service: Thoracic;  Laterality: Left;   VIDEO ASSISTED THORACOSCOPY Right 07/28/2015   Procedure: RIGHT VIDEO ASSISTED THORACOSCOPY;  Surgeon: Melrose Nakayama, MD;  Location: [place];  Service: Thoracic;  Laterality: Right;   VIDEO ASSISTED THORACOSCOPY (VATS)/ LYMPH NODE SAMPLING Right 07/28/2015   VIDEO ASSISTED THORACOSCOPY (VATS)/WEDGE RESECTION  05/31/2014   VIDEO BRONCHOSCOPY WITH ENDOBRONCHIAL ULTRASOUND  07/28/2015   VIDEO BRONCHOSCOPY WITH ENDOBRONCHIAL ULTRASOUND N/A 07/28/2015    Procedure: VIDEO BRONCHOSCOPY WITH ENDOBRONCHIAL ULTRASOUND;  Surgeon: Melrose Nakayama, MD;  Location: [place];  Service: Thoracic;  Laterality: N/A;   Patient Active Problem List   Diagnosis Date Noted   Primary hypertension 09/12/2022   Closed fracture of right proximal humerus 10/03/2021   Traumatic complete tear of right rotator cuff 10/03/2021   Lumbar radiculopathy 10/03/2021   Goals of care, counseling/discussion 09/25/2018   Mass of right chest wall 09/18/2018   PD (Parkinson's disease) 11/27/2016   Lymphadenopathy 07/28/2015   Mediastinal adenopathy 07/14/2015   Aphasia 03/04/2015   Slurred speech 03/04/2015   Pleural mass 05/31/2014   Abdominal pain, epigastric 02/02/2014   Weight loss 08/12/2012   H/O Bell's palsy 08/12/2012   Hodgkin's disease 08/09/2011    ONSET DATE: 01/02/2023 (MD referral)  REFERRING DIAG: G20.B2 (ICD-10-CM) - Parkinson's disease with dyskinesia and fluctuating manifestations   THERAPY DIAG:  Other abnormalities of gait and mobility  Unsteadiness on feet  Muscle weakness (generalized)  Rationale for Evaluation and Treatment: Rehabilitation  SUBJECTIVE:                                                                                                                                                                                             SUBJECTIVE STATEMENT: On round 7 of 12 of chemo, (Fridays and 2-3 week break); PET scan in February was clear.  Retired since last bout of therapy.  Still having the sticky feet and freezing in crowds Pt accompanied by: self  PERTINENT HISTORY: previous d/c from PT 11/08/22, due to ongoing treatment for Hodgkin's disease (recurrent)-see PMH above  PAIN:  Are you having pain? Yes: NPRS scale: 2-3/10 Pain location: back  Pain description: consistent Aggravating factors: just there Relieving factors: stretching *PT will monitor, but will not address as a goal at this time, as pain is longstanding and he  manages with daily stretches.  PRECAUTIONS: Fall and Other: currently on chemo  WEIGHT BEARING RESTRICTIONS: No  FALLS: Has patient fallen in  last 6 months? Yes. Number of falls 2-3  LIVING ENVIRONMENT: Lives with: lives with their family and significant other Lives in: House/apartment Stairs: 2 Has following equipment at home: Single point cane, Environmental consultant - 2 wheeled, and Wheelchair (manual)  PLOF: Independent and recently retired; goes to [employer] -uses stationary bike  PATIENT GOALS: Looking for ways for back to not be painful  OBJECTIVE:   DIAGNOSTIC FINDINGS: NA for PD at this time  COGNITION: Overall cognitive status: Within functional limits for tasks assessed   SENSATION: Light touch: intact to light touch, but pt does have hx of neuropathy Reports neuropathy in bilat feet  POSTURE: rounded shoulders, forward head, and dyskinesias  LOWER EXTREMITY ROM:   tightness in L hamstrings  LOWER EXTREMITY MMT:    MMT Right Eval Left Eval  Hip flexion 4+ 4+  Hip extension    Hip abduction    Hip adduction    Hip internal rotation    Hip external rotation    Knee flexion 4 4  Knee extension 4 4  Ankle dorsiflexion 3+ 3+  Ankle plantarflexion    Ankle inversion    Ankle eversion    (Blank rows = not tested)   TRANSFERS: Assistive device utilized: None  Sit to stand: Modified independence Stand to sit: Modified independence  GAIT: Gait pattern: step through pattern, decreased step length- Right, decreased step length- Left, decreased ankle dorsiflexion- Right, decreased ankle dorsiflexion- Left, festinating, narrow BOS, poor foot clearance- Right, and poor foot clearance- Left Distance walked: 50 ft x 2 Assistive device utilized:  single walking pole and None Level of assistance: Modified independence Comments: Cues to use walking pole for sequence-longer stride, improved heelstrike, and for posture reset if needed  FUNCTIONAL TESTS:  5 times sit to  stand: 10.97 sec Timed up and go (TUG): 11.84 sec 10 meter walk test: 9.22 sec= 3.56 ft/sec;  with cognitive task:  13.38 sec (2.45 ft/sec) TUG cognitive:  23.85 sec 54M back:  9.16 sec (1.09 ft/sec) Push and release:  Posterior:   Multiple small steps to recover  Anterior: Multiple small festinating steps to recover  Lateral R and L:  Multiple small steps to recover   TODAY'S TREATMENT:                                                                                                                              DATE: 02/05/2023    PATIENT EDUCATION: Education details: PT eval results, POC; initial HEP-stretches Person educated: Patient Education method: Explanation, Demonstration, Verbal cues, and Handouts Education comprehension: verbalized understanding, returned demonstration, and needs further education  HOME EXERCISE PROGRAM: Access Code: [facility] URL: [email or web address] Date: 02/05/2023 Prepared by: [facility]  Exercises - Seated Hamstring stretch  - 1-2 x daily - 7 x weekly - 1 sets - 3 reps - 30 sec hold - Standing Gastroc Stretch at Counter  - 1-2 x daily -  7 x weekly - 1 sets - 3 reps - 30 sec hold  GOALS: Goals reviewed with patient? Yes  SHORT TERM GOALS: Target date: 03/08/2023  Pt will be independent with HEP for improved flexibility, balance, gait. Baseline: Goal status: INITIAL  2.  Pt will verbalize/demo tips to reduce freezing episodes with use of walking pole. Baseline:  Goal status: INITIAL  LONG TERM GOALS: Target date: 05/03/2023  Pt will be independent with progression of HEP for improved balance, flexibility, gait. Baseline:  Goal status: INITIAL  2.  Pt will improve TUG/TUG cognitive score to less than or equal to 10% difference for decreased fall risk. Baseline: 11.84 sec/cog 23.85 sec Goal status: INITIAL  3.  Pt will demonstrate balance recovery in posterior and anterior push and release  test, in 2 or less steps. Baseline: multiple small steps Goal status: INITIAL  4.  Pt will improve gait velocity with dual cognitive task to at least 3 ft/sec Baseline: 2.45 ft/sec Goal status: INITIAL  ASSESSMENT:  CLINICAL IMPRESSION: Patient is a 62 y.o. male who was seen today for physical therapy evaluation and treatment for Parkinson's disease.   Today's eval is a recommended return eval from discharge in December 2023.  Pt is currently undergoing chemo for Hodgkin's disease; he does report other than stretches, his exercise routine has been less consistent and he has had 2-3 falls in the past several months.  He demonstrates decreased ability for dual tasking with gait (both with TUG and gait velocity tasks) and postural instability with difficulty regaining balance in push and release tests.  Given pt's other current medical issues, PT recommends and pt agrees to working together on a less frequent basis for optimal fitness/exercise routine and for ways to decrease fall risk.  OBJECTIVE IMPAIRMENTS: Abnormal gait, decreased balance, decreased knowledge of use of DME, decreased mobility, difficulty walking, decreased strength, impaired flexibility, and postural dysfunction.   ACTIVITY LIMITATIONS: bending, sitting, standing, squatting, transfers, and locomotion level  PARTICIPATION LIMITATIONS: driving, community activity, and exercise  PERSONAL FACTORS: 3+ comorbidities: see above; currently undergoing chemo for Hodgkin's disease  are also affecting patient's functional outcome.   REHAB POTENTIAL: Good  CLINICAL DECISION MAKING: Evolving/moderate complexity  EVALUATION COMPLEXITY: Moderate  PLAN:  PT FREQUENCY: 1-2x/month  PT DURATION: 12 weeks  PLANNED INTERVENTIONS: Therapeutic exercises, Therapeutic activity, Neuromuscular re-education, Balance training, Gait training, Patient/Family education, Self Care, and DME instructions  PLAN FOR NEXT SESSION: Review stretches and  add step strategy exercises.  Work on sequence of deliberate pace with walking pole and gait.  Provide information on optimal exercise routine and tips to reduce freezing; will need to try to incorporate dual tasking   Conard Alvira W., PT 02/05/2023, 11:31 AM  [facility] at [facility], [address], [place] [address] Phone # [phone] Fax # [phone]

## 2023-02-08 ENCOUNTER — Inpatient Hospital Stay: Payer: Commercial Managed Care - PPO

## 2023-02-08 ENCOUNTER — Encounter: Payer: Self-pay | Admitting: Hematology & Oncology

## 2023-02-08 ENCOUNTER — Inpatient Hospital Stay (HOSPITAL_BASED_OUTPATIENT_CLINIC_OR_DEPARTMENT_OTHER): Payer: Commercial Managed Care - PPO | Admitting: Hematology & Oncology

## 2023-02-08 VITALS — BP 106/61

## 2023-02-08 DIAGNOSIS — C8195 Hodgkin lymphoma, unspecified, lymph nodes of inguinal region and lower limb: Secondary | ICD-10-CM

## 2023-02-08 DIAGNOSIS — Z5111 Encounter for antineoplastic chemotherapy: Secondary | ICD-10-CM | POA: Diagnosis not present

## 2023-02-08 LAB — CBC WITH DIFFERENTIAL (CANCER CENTER ONLY)
Abs Immature Granulocytes: 0.89 10*3/uL — ABNORMAL HIGH (ref 0.00–0.07)
Basophils Absolute: 0.1 10*3/uL (ref 0.0–0.1)
Basophils Relative: 1 %
Eosinophils Absolute: 0.1 10*3/uL (ref 0.0–0.5)
Eosinophils Relative: 1 %
HCT: 28.4 % — ABNORMAL LOW (ref 39.0–52.0)
Hemoglobin: 8.9 g/dL — ABNORMAL LOW (ref 13.0–17.0)
Immature Granulocytes: 8 %
Lymphocytes Relative: 7 %
Lymphs Abs: 0.8 10*3/uL (ref 0.7–4.0)
MCH: 31.3 pg (ref 26.0–34.0)
MCHC: 31.3 g/dL (ref 30.0–36.0)
MCV: 100 fL (ref 80.0–100.0)
Monocytes Absolute: 1.3 10*3/uL — ABNORMAL HIGH (ref 0.1–1.0)
Monocytes Relative: 11 %
Neutro Abs: 8.3 10*3/uL — ABNORMAL HIGH (ref 1.7–7.7)
Neutrophils Relative %: 72 %
Platelet Count: 195 10*3/uL (ref 150–400)
RBC: 2.84 MIL/uL — ABNORMAL LOW (ref 4.22–5.81)
RDW: 17.1 % — ABNORMAL HIGH (ref 11.5–15.5)
WBC Count: 11.5 10*3/uL — ABNORMAL HIGH (ref 4.0–10.5)
nRBC: 0.8 % — ABNORMAL HIGH (ref 0.0–0.2)

## 2023-02-08 LAB — CMP (CANCER CENTER ONLY)
ALT: <5 U/L (ref 0–44)
AST: 11 U/L — ABNORMAL LOW (ref 15–41)
Albumin: 4.1 g/dL (ref 3.5–5.0)
Alkaline Phosphatase: 104 U/L (ref 38–126)
Anion gap: 8 (ref 5–15)
BUN: 38 mg/dL — ABNORMAL HIGH (ref 8–23)
CO2: 27 mmol/L (ref 22–32)
Calcium: 9.3 mg/dL (ref 8.9–10.3)
Chloride: 104 mmol/L (ref 98–111)
Creatinine: 1.5 mg/dL — ABNORMAL HIGH (ref 0.61–1.24)
GFR, Estimated: 52 mL/min — ABNORMAL LOW (ref >60)
Glucose, Bld: 90 mg/dL (ref 70–99)
Potassium: 4.5 mmol/L (ref 3.5–5.1)
Sodium: 139 mmol/L (ref 135–145)
Total Bilirubin: 0.4 mg/dL (ref 0.3–1.2)
Total Protein: 6.4 g/dL — ABNORMAL LOW (ref 6.5–8.1)

## 2023-02-08 LAB — SAVE SMEAR(SSMR), FOR PROVIDER SLIDE REVIEW

## 2023-02-08 LAB — LACTATE DEHYDROGENASE: LDH: 304 U/L — ABNORMAL HIGH (ref 98–192)

## 2023-02-08 MED ORDER — VINBLASTINE SULFATE CHEMO INJECTION 1 MG/ML
6.0000 mg/m2 | Freq: Once | INTRAVENOUS | Status: AC
  Administered 2023-02-08: 11.9 mg via INTRAVENOUS
  Filled 2023-02-08: qty 11.9

## 2023-02-08 MED ORDER — SODIUM CHLORIDE 0.9 % IV SOLN
150.0000 mg | Freq: Once | INTRAVENOUS | Status: AC
  Administered 2023-02-08: 150 mg via INTRAVENOUS
  Filled 2023-02-08: qty 150

## 2023-02-08 MED ORDER — SODIUM CHLORIDE 0.9% FLUSH
10.0000 mL | INTRAVENOUS | Status: DC | PRN
  Administered 2023-02-08: 10 mL

## 2023-02-08 MED ORDER — SODIUM CHLORIDE 0.9 % IV SOLN: Freq: Once | INTRAVENOUS | Status: AC

## 2023-02-08 MED ORDER — SODIUM CHLORIDE 0.9 % IV SOLN
10.0000 mg | Freq: Once | INTRAVENOUS | Status: AC
  Administered 2023-02-08: 10 mg via INTRAVENOUS
  Filled 2023-02-08: qty 10

## 2023-02-08 MED ORDER — DOXORUBICIN HCL CHEMO IV INJECTION 2 MG/ML
25.0000 mg/m2 | Freq: Once | INTRAVENOUS | Status: AC
  Administered 2023-02-08: 50 mg via INTRAVENOUS
  Filled 2023-02-08: qty 25

## 2023-02-08 MED ORDER — SODIUM CHLORIDE 0.9 % IV SOLN
240.0000 mg | Freq: Once | INTRAVENOUS | Status: AC
  Administered 2023-02-08: 240 mg via INTRAVENOUS
  Filled 2023-02-08: qty 24

## 2023-02-08 MED ORDER — SODIUM CHLORIDE 0.9 % IV SOLN
375.0000 mg/m2 | Freq: Once | INTRAVENOUS | Status: AC
  Administered 2023-02-08: 800 mg via INTRAVENOUS
  Filled 2023-02-08: qty 80

## 2023-02-08 MED ORDER — LACTATED RINGERS IV SOLN
250.0000 mg/m2 | Freq: Once | INTRAVENOUS | Status: AC
  Administered 2023-02-08: 500 mg via INTRAVENOUS
  Filled 2023-02-08: qty 50

## 2023-02-08 MED ORDER — PALONOSETRON HCL INJECTION 0.25 MG/5ML
0.2500 mg | Freq: Once | INTRAVENOUS | Status: AC
  Administered 2023-02-08: 0.25 mg via INTRAVENOUS
  Filled 2023-02-08: qty 5

## 2023-02-08 MED ORDER — HEPARIN SOD (PORK) LOCK FLUSH 100 UNIT/ML IV SOLN
500.0000 [IU] | Freq: Once | INTRAVENOUS | Status: AC | PRN
  Administered 2023-02-08: 500 [IU]

## 2023-02-08 NOTE — Progress Notes (Signed)
Hematology and Oncology Follow Up Visit  Brian [name] [id number] 1961/09/25 62 y.o. 02/08/2023   Principle Diagnosis:  Recurrent Hodgkin's Disease -  S/p CAR-T therapy -- relapsed TIA-resolved  Current Therapy:  ANVD -- s/p cycle #4 -- start on 10/25/2022 Nivolumab q 3 month -- s/p cycle #18 -- on hold XRT -- Involved field --completed on 08/06/2022  Neulasta 6 mg IM post each chemotherapy       Interim History:  Brian [name] is in for follow-up.  Everything looks quite good.  His face does not look nearly as affected by the nivolumab.  He has been on doxycycline.  He did get Neulasta with his last chemotherapy.  His white cell count bumped up quite nicely.  We will hold the Neulasta at this time.  He has Parkinson's has been about the same.  I think he did have an episode where he fell.  He is eating well.  He is having no cough or shortness of breath.  He has had no nausea or vomiting.  There has been no bleeding.  He has had no fever.  He has had no leg swelling.  Of note, his last echocardiogram was done back on 12/19/2022.  This showed a well-maintained ejection fraction of 60-65%.  Overall, I was his performance status is probably ECOG 1.      Medications:  Current Outpatient Medications:    carbidopa-levodopa (SINEMET CR) 50-200 MG tablet, TAKE 1 TABLET BY MOUTH AT BEDTIME, Disp: 90 tablet, Rfl: 0   carbidopa-levodopa (SINEMET IR) 25-100 MG tablet, TAKE 2 TABLETS AT 7AM, 11AM, 3 PM and 7PM (Patient taking differently: 3 (three) times daily. 02/08/2023 TAKE 2 TABLETS AT 7AM, 11AM, 3 PM.), Disp: 720 tablet, Rfl: 0   cholecalciferol (VITAMIN D3) 25 MCG (1000 UNIT) tablet, Take 1,000 Units by mouth daily., Disp: , Rfl:    dexamethasone (DECADRON) 4 MG tablet, Take 2 tablets (8 mg) by mouth daily for 3 days after chemotherapy. Take with food., Disp: 90 tablet, Rfl: 3   diphenhydramine-acetaminophen (TYLENOL PM) 25-500 MG TABS tablet, Take 1 tablet by mouth at bedtime as needed., Disp:  , Rfl:    pramipexole (MIRAPEX) 0.5 MG tablet, TAKE 1 TABLET(0.5 MG) BY MOUTH THREE TIMES DAILY, Disp: 270 tablet, Rfl: 0   dronabinol (MARINOL) 5 MG capsule, Take 1 capsule (5 mg total) by mouth 2 (two) times daily before a meal. (Patient not taking: Reported on 02/08/2023), Disp: 60 capsule, Rfl: 0  Allergies: No Known Allergies  Past Medical History, Surgical history, Social history, and Family History were reviewed and updated.  Review of Systems: Review of Systems  Neurological:  Positive for tremors.  All other systems reviewed and are negative.    Physical Exam:  temperature is 98.4 F (36.9 C) (pended). His blood pressure is 111/63 (pended) and his pulse is 86 (pended). His respiration is 17 (pended) and oxygen saturation is 9% (abnormal, pended).   Physical Exam Vitals reviewed.  HENT:     Head: Normocephalic and atraumatic.  Eyes:     Pupils: Pupils are equal, round, and reactive to light.  Cardiovascular:     Rate and Rhythm: Normal rate and regular rhythm.     Heart sounds: Normal heart sounds.  Pulmonary:     Effort: Pulmonary effort is normal.     Breath sounds: Normal breath sounds.  Abdominal:     General: Bowel sounds are normal.     Palpations: Abdomen is soft.  Musculoskeletal:  General: No tenderness or deformity. Normal range of motion.     Cervical back: Normal range of motion.  Lymphadenopathy:     Cervical: No cervical adenopathy.  Skin:    General: Skin is warm and dry.     Findings: No erythema or rash.  Neurological:     Mental Status: He is alert and oriented to person, place, and time.  Psychiatric:        Behavior: Behavior normal.        Thought Content: Thought content normal.        Judgment: Judgment normal.     Lab Results  Component Value Date   WBC 11.5 (H) 02/08/2023   HGB 8.9 (L) 02/08/2023   HCT 28.4 (L) 02/08/2023   MCV 100.0 02/08/2023   PLT 195 02/08/2023     Chemistry      Component Value Date/Time   NA 139  02/08/2023 0820   NA 142 04/02/2017 0000   NA 140 07/19/2016 1305   K 4.5 02/08/2023 0820   K 4.4 07/19/2016 1305   CL 104 02/08/2023 0820   CL 105 02/08/2016 1117   CL 107 03/25/2013 0828   CO2 27 02/08/2023 0820   CO2 26 07/19/2016 1305   BUN 38 (H) 02/08/2023 0820   BUN 21 04/02/2017 0000   BUN 22.8 07/19/2016 1305   CREATININE 1.50 (H) 02/08/2023 0820   CREATININE 1.1 07/19/2016 1305   GLU 105 04/02/2017 0000      Component Value Date/Time   CALCIUM 9.3 02/08/2023 0820   CALCIUM 9.8 07/19/2016 1305   ALKPHOS 104 02/08/2023 0820   ALKPHOS 76 07/19/2016 1305   AST 11 (L) 02/08/2023 0820   AST 18 07/19/2016 1305   ALT <5 02/08/2023 0820   ALT 14 07/19/2016 1305   BILITOT 0.4 02/08/2023 0820   BILITOT 0.70 07/19/2016 1305      Impression and Plan: Brian [name] is a 62 year old gentleman.  He has history of recurrent Hodgkin's disease. 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 He then had a recurrence after this.  He did undergo [facility] therapy at [facility].  I believe he had this in April 2017.  He did well with this.  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  This was proven by biopsy.  He had radiation therapy for this.  He subsequently has had another recurrence.  This was in the right axilla.  This was biopsy-proven.  He did undergo radiation therapy for this.  This was completed in September/2023.  Now, we have another recurrence.  I really should not be surprised by this.  He currently is on chemotherapy with ANVB.  I think he is doing well with this.  His last PET scan showed that he was in remission.  I noted that his hemoglobin is dropping slowly.  We will have to watch this closely.  I am sure this is from his chemotherapy.  We will finish up his fourth cycle of treatment.  We will then plan for a PET scan.  I will give him an extra week off after this cycle.  I think he would benefit from this.      [name],  MD 3/15/20249:02 AM

## 2023-02-08 NOTE — Patient Instructions (Signed)

## 2023-02-13 ENCOUNTER — Telehealth: Payer: Self-pay

## 2023-02-13 NOTE — Telephone Encounter (Signed)
Error

## 2023-02-18 ENCOUNTER — Other Ambulatory Visit: Payer: Self-pay | Admitting: Neurology

## 2023-02-18 DIAGNOSIS — G20A1 Parkinson's disease without dyskinesia, without mention of fluctuations: Secondary | ICD-10-CM

## 2023-02-19 ENCOUNTER — Ambulatory Visit: Payer: Commercial Managed Care - PPO | Admitting: Physical Therapy

## 2023-02-19 DIAGNOSIS — M6281 Muscle weakness (generalized): Secondary | ICD-10-CM

## 2023-02-19 DIAGNOSIS — R2689 Other abnormalities of gait and mobility: Secondary | ICD-10-CM

## 2023-02-19 DIAGNOSIS — R2681 Unsteadiness on feet: Secondary | ICD-10-CM

## 2023-02-19 NOTE — Therapy (Signed)
OUTPATIENT PHYSICAL THERAPY NEURO TREATMENT NOTE   Patient Name: KHAIR VIESCA MRN: [id number] DOB:10-29-1961, 62 y.o., male Today's Date: 02/19/2023   PCP: Lamar Blinks, MD REFERRING PROVIDER: Alonza Bogus, DO  END OF SESSION:  PT End of Session - 02/19/23 1240     Visit Number 2    Number of Visits 5    Date for PT Re-Evaluation 05/03/23    Authorization Type [employer] (from [facility])    PT Start Time 1238   Pt arrives late   PT Stop Time 1320    PT Time Calculation (min) 42 min    Activity Tolerance Patient tolerated treatment well    Behavior During Therapy WFL for tasks assessed/performed             Past Medical History:  Diagnosis Date   Anxiety    Dysrhythmia    past hx pvc   Goals of care, counseling/discussion 09/25/2018   H/O autologous stem cell transplant ([place])    may 2014  at [facility]   History of Bell's palsy    2009  RIGHT SIDE--  HAS 80% FUNCTION / PT STATES A LITTLE ASYMETRICAL AND EFFECTS MOUTH   History of radiation therapy 10/18/17-11/05/17   sprine T1 26 Gy in 13 fractions, spine boost 10 Gy in 5 fractions   History of radiation therapy    Right hip, Rt Sclav- 07/23/22-08/07/11- Dr. Gery Pray   Hodgkin's disease, nodular sclerosis, of inguinal region/lower limb ([place]) ONOLOGIST--  DR Marin Olp AND A [facility]     [place] CHEMO 2013/  AUTOLOGUS STEM CELL TRANSPLANT MAY 2014 AT [facility]   Hypertension    Mass of right chest wall 09/18/2018   Mass of right inguinal region    Neuromuscular disorder ([place])    bilateral feet neuropathy   PVC (premature ventricular contraction)    "benign"   TIA (transient ischemic attack) 02/2015   "probable TIA"   Wears contact lenses    Past Surgical History:  Procedure Laterality Date   AXILLARY LYMPH NODE BIOPSY Left 02/02/2013   Procedure: NEEDLE LOCALIZED AXILLARY LYMPH NODE BIOPSY;  Surgeon: Haywood Lasso, MD;  Location: [place];  Service: General;  Laterality: Left;   BACK SURGERY  03/2021   BONE  MARROW BIOPSY  08/26/2012   COLONOSCOPY  2021   CYST REMOVAL NECK Left 11/26/1978   IR IMAGING GUIDED PORT INSERTION  10/23/2022   LEFT INGUINAL LYMPH NODE BX  09/09/2012   LYMPH NODE BIOPSY N/A 07/28/2015   Procedure: LYMPH NODE BIOPSY;  Surgeon: Melrose Nakayama, MD;  Location: [place];  Service: Thoracic;  Laterality: N/A;   LYMPH NODE BIOPSY Right 06/29/2022   Procedure: RIGHT AXILLARY NODE BIOPSY;  Surgeon: Melrose Nakayama, MD;  Location: [place];  Service: Vascular;  Laterality: Right;   MASS EXCISION Right 09/19/2018   Procedure: EXCISION OF RIGHT CHEST WALL MASS ERAS PATHWAY;  Surgeon: Fanny Skates, MD;  Location: [place];  Service: General;  Laterality: Right;   NODE DISSECTION Right 07/28/2015   Procedure: NODE DISSECTION;  Surgeon: Melrose Nakayama, MD;  Location: [place];  Service: Thoracic;  Laterality: Right;   PLEURA BIOPSY Left 05/31/2014   REMOVAL RIGHT INGUINAL LYMPH NODES  08/16/2011   SCROTAL EXPLORATION Right 01/04/2014   Procedure: SCROTUM EXPLORATION   INGUINAL , EXCISION OF CYSTIC MASS OF RIGHT SPERMATIC CORD, [place];  Surgeon: Franchot Gallo, MD;  Location: [facility];  Service: Urology;  Laterality: Right;  TRANSTHORACIC ECHOCARDIOGRAM  03/18/2013   MILD LVH/  EF 55-60%   VIDEO ASSISTED THORACOSCOPY Left 05/31/2014   Procedure: LEFT VIDEO ASSISTED THORACOSCOPY, PLEURAL BIOPSY;  Surgeon: Melrose Nakayama, MD;  Location: [place];  Service: Thoracic;  Laterality: Left;   VIDEO ASSISTED THORACOSCOPY Right 07/28/2015   Procedure: RIGHT VIDEO ASSISTED THORACOSCOPY;  Surgeon: Melrose Nakayama, MD;  Location: [place];  Service: Thoracic;  Laterality: Right;   VIDEO ASSISTED THORACOSCOPY (VATS)/ LYMPH NODE SAMPLING Right 07/28/2015   VIDEO ASSISTED THORACOSCOPY (VATS)/WEDGE RESECTION  05/31/2014   VIDEO BRONCHOSCOPY WITH ENDOBRONCHIAL ULTRASOUND  07/28/2015   VIDEO BRONCHOSCOPY WITH ENDOBRONCHIAL ULTRASOUND N/A  07/28/2015   Procedure: VIDEO BRONCHOSCOPY WITH ENDOBRONCHIAL ULTRASOUND;  Surgeon: Melrose Nakayama, MD;  Location: [place];  Service: Thoracic;  Laterality: N/A;   Patient Active Problem List   Diagnosis Date Noted   Primary hypertension 09/12/2022   Closed fracture of right proximal humerus 10/03/2021   Traumatic complete tear of right rotator cuff 10/03/2021   Lumbar radiculopathy 10/03/2021   Goals of care, counseling/discussion 09/25/2018   Mass of right chest wall 09/18/2018   PD (Parkinson's disease) 11/27/2016   Lymphadenopathy 07/28/2015   Mediastinal adenopathy 07/14/2015   Aphasia 03/04/2015   Slurred speech 03/04/2015   Pleural mass 05/31/2014   Abdominal pain, epigastric 02/02/2014   Weight loss 08/12/2012   H/O Bell's palsy 08/12/2012   Hodgkin's disease 08/09/2011    ONSET DATE: 01/02/2023 (MD referral)  REFERRING DIAG: G20.B2 (ICD-10-CM) - Parkinson's disease with dyskinesia and fluctuating manifestations   THERAPY DIAG:  Unsteadiness on feet  Other abnormalities of gait and mobility  Muscle weakness (generalized)  Rationale for Evaluation and Treatment: Rehabilitation  SUBJECTIVE:                                                                                                                                                                                             SUBJECTIVE STATEMENT: Feel a little light-headed recently.  BP measures tend to be all over the place.  Went to the gym and rode the stationary bike. Pt accompanied by: self  PERTINENT HISTORY: previous d/c from PT 11/08/22, due to ongoing treatment for Hodgkin's disease (recurrent)-see PMH above  PAIN:  Are you having pain? Yes: NPRS scale: 7-8/10 Pain location: back  Pain description: consistent Aggravating factors: just there Relieving factors: stretching *PT will monitor, but will not address as a goal at this time, as pain is longstanding and he manages with daily  stretches.  PRECAUTIONS: Fall and Other: currently on chemo  WEIGHT BEARING RESTRICTIONS: No  FALLS: Has patient fallen in last 6 months? Yes.  Number of falls 2-3  LIVING ENVIRONMENT: Lives with: lives with their family and significant other Lives in: House/apartment Stairs: 2 Has following equipment at home: Single point cane, Environmental consultant - 2 wheeled, and Wheelchair (manual)  PLOF: Independent and recently retired; goes to [employer] -uses stationary bike  PATIENT GOALS: Looking for ways for back to not be painful  OBJECTIVE:   Vitals: BP:  93/57 HR:  101 bpm  TODAY'S TREATMENT: 02/19/2023 Activity Comments  Reviewed stretches and pt return demo, but needs cues for technique Pt also shows additional stretches he does in morning and throughout the day to try to keep back more stretched and to lessen pain  Attempted lumbar flexion/hamstring stretch standing at counter, but pt does not feel comfortable with this stretch Did not continue  Functional mobility practice: -supine>sit (pt tries to sit up to long sit); PT educates pt to initiate rolling (with rocking) and go supine>sidelying>sit -floor>stand, particularly supine>sidelying>quadruped, (cues to initiate with rocking) then up to stand with UE support -prone>sidelying R then prone>sidelying L with cues for technique to generate momentum Based on questions that pt has for positions that are difficult  TherEx: -Sit to stand x 5 reps -Minisquat to stand -Forward step and weigthshift -Side step and weightshift             Access Code: [facility] URL: [email or web address] Date: 02/19/2023 Prepared by: [facility]  Exercises - Seated Hamstring stretch  - 1-2 x daily - 7 x weekly - 1 sets - 3 reps - 30 sec hold - Standing Gastroc Stretch at Counter  - 1-2 x daily - 7 x weekly - 1 sets - 3 reps - 30 sec hold - Sit to Stand  - 1 x daily - 7 x weekly - 2 sets - 10 reps - Mini  Squat  - 1 x daily - 5 x weekly - 1-2 sets - 10 reps - Side Stepping with Counter Support  - 1 x daily - 5 x weekly - 1-2 sets - 10 reps - Alternating Step forward-Balance Reaction  - 1 x daily - 5 x weekly - 1-2 sets - 10 reps  PATIENT EDUCATION: Education details: HEP additions Person educated: Patient Education method: Consulting civil engineer, Demonstration, and Handouts Education comprehension: verbalized understanding, returned demonstration, and needs further education  ---------------------------------------------------------------- Objective measures taken at initial evaluation:  DIAGNOSTIC FINDINGS: NA for PD at this time  COGNITION: Overall cognitive status: Within functional limits for tasks assessed   SENSATION: Light touch: intact to light touch, but pt does have hx of neuropathy Reports neuropathy in bilat feet  POSTURE: rounded shoulders, forward head, and dyskinesias  LOWER EXTREMITY ROM:   tightness in L hamstrings  LOWER EXTREMITY MMT:    MMT Right Eval Left Eval  Hip flexion 4+ 4+  Hip extension    Hip abduction    Hip adduction    Hip internal rotation    Hip external rotation    Knee flexion 4 4  Knee extension 4 4  Ankle dorsiflexion 3+ 3+  Ankle plantarflexion    Ankle inversion    Ankle eversion    (Blank rows = not tested)   TRANSFERS: Assistive device utilized: None  Sit to stand: Modified independence Stand to sit: Modified independence  GAIT: Gait pattern: step through pattern, decreased step length- Right, decreased step length- Left, decreased ankle dorsiflexion- Right, decreased ankle dorsiflexion- Left, festinating, narrow BOS, poor foot clearance- Right, and poor foot clearance-  Left Distance walked: 50 ft x 2 Assistive device utilized:  single walking pole and None Level of assistance: Modified independence Comments: Cues to use walking pole for sequence-longer stride, improved heelstrike, and for posture reset if needed  FUNCTIONAL  TESTS:  5 times sit to stand: 10.97 sec Timed up and go (TUG): 11.84 sec 10 meter walk test: 9.22 sec= 3.56 ft/sec;  with cognitive task:  13.38 sec (2.45 ft/sec) TUG cognitive:  23.85 sec 50M back:  9.16 sec (1.09 ft/sec) Push and release:  Posterior:   Multiple small steps to recover  Anterior: Multiple small festinating steps to recover  Lateral R and L:  Multiple small steps to recover   TODAY'S TREATMENT:                                                                                                                              DATE: 02/05/2023    PATIENT EDUCATION: Education details: PT eval results, POC; initial HEP-stretches Person educated: Patient Education method: Explanation, Demonstration, Verbal cues, and Handouts Education comprehension: verbalized understanding, returned demonstration, and needs further education  HOME EXERCISE PROGRAM: Access Code: [facility] URL: [email or web address] Date: 02/05/2023 Prepared by: [facility]  Exercises - Seated Hamstring stretch  - 1-2 x daily - 7 x weekly - 1 sets - 3 reps - 30 sec hold - Standing Gastroc Stretch at Counter  - 1-2 x daily - 7 x weekly - 1 sets - 3 reps - 30 sec hold  GOALS: Goals reviewed with patient? Yes  SHORT TERM GOALS: Target date: 03/08/2023  Pt will be independent with HEP for improved flexibility, balance, gait. Baseline: Goal status: IN PROGRESS  2.  Pt will verbalize/demo tips to reduce freezing episodes with use of walking pole. Baseline:  Goal status: IN PROGRESS  LONG TERM GOALS: Target date: 05/03/2023  Pt will be independent with progression of HEP for improved balance, flexibility, gait. Baseline:  Goal status: IN PROGRESS  2.  Pt will improve TUG/TUG cognitive score to less than or equal to 10% difference for decreased fall risk. Baseline: 11.84 sec/cog 23.85 sec Goal status: IN PROGRESS  3.  Pt will demonstrate balance recovery in  posterior and anterior push and release test, in 2 or less steps. Baseline: multiple small steps Goal status: IN PROGRESS  4.  Pt will improve gait velocity with dual cognitive task to at least 3 ft/sec Baseline: 2.45 ft/sec Goal status: IN PROGRESS  ASSESSMENT:  CLINICAL IMPRESSION: Pt returns to [place] today for first visit after eval, as he is continuing to undergo treatments for Hodgkins disease.  He is to have PET scan in April.  Focused today's session on review of stretching and updates to HEP for strengthening.  Also worked on answering pt's questions for improving mobility transitions (supine>sit, floor to stand, prone to sidelying), with pt responding well to generate movement with rocking motions in these positions.  He will  continue to benefit from skilled PT to further progress HEP as he is able to tolerate, along with his other cancer treatments.  OBJECTIVE IMPAIRMENTS: Abnormal gait, decreased balance, decreased knowledge of use of DME, decreased mobility, difficulty walking, decreased strength, impaired flexibility, and postural dysfunction.   ACTIVITY LIMITATIONS: bending, sitting, standing, squatting, transfers, and locomotion level  PARTICIPATION LIMITATIONS: driving, community activity, and exercise  PERSONAL FACTORS: 3+ comorbidities: see above; currently undergoing chemo for Hodgkin's disease  are also affecting patient's functional outcome.   REHAB POTENTIAL: Good  CLINICAL DECISION MAKING: Evolving/moderate complexity  EVALUATION COMPLEXITY: Moderate  PLAN:  PT FREQUENCY: 1-2x/month  PT DURATION: 12 weeks  PLANNED INTERVENTIONS: Therapeutic exercises, Therapeutic activity, Neuromuscular re-education, Balance training, Gait training, Patient/Family education, Self Care, and DME instructions  PLAN FOR NEXT SESSION: Review strengthening and step strategy exercises.  Work on sequence of deliberate pace with walking pole and gait.  Provide information on optimal  exercise routine and tips to reduce freezing; will need to try to incorporate dual tasking   Jahnavi Muratore W., PT 02/19/2023, 2:45 PM  [facility] at [facility] [address] [place], [place] [address] Phone # [phone] Fax # [phone]

## 2023-02-21 ENCOUNTER — Other Ambulatory Visit: Payer: Self-pay

## 2023-02-21 ENCOUNTER — Telehealth: Payer: Self-pay | Admitting: Neurology

## 2023-02-21 DIAGNOSIS — G20B2 Parkinson's disease with dyskinesia, with fluctuations: Secondary | ICD-10-CM

## 2023-02-21 DIAGNOSIS — G903 Multi-system degeneration of the autonomic nervous system: Secondary | ICD-10-CM

## 2023-02-21 MED ORDER — AMBULATORY NON FORMULARY MEDICATION: 0 refills | Status: DC

## 2023-02-21 NOTE — Telephone Encounter (Signed)
Brian [name] called to let Dr. Carles Collet know that he is having low BP every day 70/90 and several other very low B/P Cable is currently doing Chemo but he said his B/P is usually high nad he knows PD and Carbidopa levodopa can sometimes bring down your BP. Brain just looking for advice on what to do?

## 2023-02-21 NOTE — Telephone Encounter (Signed)
Called Brain and explained Dr. Arturo Morton recommendations. He asked if I would send it to [facility] Chart and I did that. He needs a prescription for the binder it is printed and getting signatures

## 2023-02-21 NOTE — Telephone Encounter (Signed)
Called patient back and answered questions. °

## 2023-02-21 NOTE — Telephone Encounter (Signed)
Called patient and left voicemail to call me back.

## 2023-02-21 NOTE — Telephone Encounter (Signed)
Patient is calling in asking for a call back from [place].

## 2023-02-21 NOTE — Telephone Encounter (Signed)
Patient would like a call from [place] to talk about a few things

## 2023-02-27 ENCOUNTER — Other Ambulatory Visit: Payer: Self-pay | Admitting: Neurology

## 2023-02-28 ENCOUNTER — Encounter: Payer: Self-pay | Admitting: Hematology & Oncology

## 2023-03-01 ENCOUNTER — Inpatient Hospital Stay: Payer: Commercial Managed Care - PPO

## 2023-03-01 ENCOUNTER — Inpatient Hospital Stay (HOSPITAL_BASED_OUTPATIENT_CLINIC_OR_DEPARTMENT_OTHER): Payer: Commercial Managed Care - PPO | Admitting: Hematology & Oncology

## 2023-03-01 ENCOUNTER — Encounter: Payer: Self-pay | Admitting: Hematology & Oncology

## 2023-03-01 ENCOUNTER — Other Ambulatory Visit: Payer: Self-pay

## 2023-03-01 ENCOUNTER — Inpatient Hospital Stay: Payer: Commercial Managed Care - PPO | Attending: Hematology & Oncology

## 2023-03-01 ENCOUNTER — Encounter: Payer: Self-pay | Admitting: *Deleted

## 2023-03-01 VITALS — BP 109/67 | HR 102 | Resp 18

## 2023-03-01 VITALS — BP 98/62 | HR 85 | Temp 97.8°F | Resp 18 | Ht 70.0 in | Wt 171.0 lb

## 2023-03-01 DIAGNOSIS — C8198 Hodgkin lymphoma, unspecified, lymph nodes of multiple sites: Secondary | ICD-10-CM | POA: Insufficient documentation

## 2023-03-01 DIAGNOSIS — C8195 Hodgkin lymphoma, unspecified, lymph nodes of inguinal region and lower limb: Secondary | ICD-10-CM

## 2023-03-01 DIAGNOSIS — Z9484 Stem cells transplant status: Secondary | ICD-10-CM | POA: Diagnosis not present

## 2023-03-01 DIAGNOSIS — Z79899 Other long term (current) drug therapy: Secondary | ICD-10-CM | POA: Diagnosis not present

## 2023-03-01 DIAGNOSIS — Z923 Personal history of irradiation: Secondary | ICD-10-CM | POA: Insufficient documentation

## 2023-03-01 DIAGNOSIS — D509 Iron deficiency anemia, unspecified: Secondary | ICD-10-CM | POA: Diagnosis not present

## 2023-03-01 DIAGNOSIS — K909 Intestinal malabsorption, unspecified: Secondary | ICD-10-CM

## 2023-03-01 DIAGNOSIS — Z5111 Encounter for antineoplastic chemotherapy: Secondary | ICD-10-CM | POA: Diagnosis present

## 2023-03-01 HISTORY — DX: Intestinal malabsorption, unspecified: K90.9

## 2023-03-01 LAB — CBC WITH DIFFERENTIAL (CANCER CENTER ONLY)
Abs Immature Granulocytes: 0.11 10*3/uL — ABNORMAL HIGH (ref 0.00–0.07)
Basophils Absolute: 0 10*3/uL (ref 0.0–0.1)
Basophils Relative: 1 %
Eosinophils Absolute: 0 10*3/uL (ref 0.0–0.5)
Eosinophils Relative: 1 %
HCT: 27.6 % — ABNORMAL LOW (ref 39.0–52.0)
Hemoglobin: 8.5 g/dL — ABNORMAL LOW (ref 13.0–17.0)
Immature Granulocytes: 3 %
Lymphocytes Relative: 11 %
Lymphs Abs: 0.4 10*3/uL — ABNORMAL LOW (ref 0.7–4.0)
MCH: 30.5 pg (ref 26.0–34.0)
MCHC: 30.8 g/dL (ref 30.0–36.0)
MCV: 98.9 fL (ref 80.0–100.0)
Monocytes Absolute: 0.9 10*3/uL (ref 0.1–1.0)
Monocytes Relative: 26 %
Neutro Abs: 2.1 10*3/uL (ref 1.7–7.7)
Neutrophils Relative %: 58 %
Platelet Count: 273 10*3/uL (ref 150–400)
RBC: 2.79 MIL/uL — ABNORMAL LOW (ref 4.22–5.81)
RDW: 16.4 % — ABNORMAL HIGH (ref 11.5–15.5)
WBC Count: 3.6 10*3/uL — ABNORMAL LOW (ref 4.0–10.5)
nRBC: 0 % (ref 0.0–0.2)

## 2023-03-01 LAB — IRON AND IRON BINDING CAPACITY (CC-WL,HP ONLY)
Iron: 31 ug/dL — ABNORMAL LOW (ref 45–182)
Saturation Ratios: 10 % — ABNORMAL LOW (ref 17.9–39.5)
TIBC: 319 ug/dL (ref 250–450)
UIBC: 288 ug/dL (ref 117–376)

## 2023-03-01 LAB — CMP (CANCER CENTER ONLY)
ALT: 6 U/L (ref 0–44)
AST: 16 U/L (ref 15–41)
Albumin: 3.6 g/dL (ref 3.5–5.0)
Alkaline Phosphatase: 65 U/L (ref 38–126)
Anion gap: 8 (ref 5–15)
BUN: 29 mg/dL — ABNORMAL HIGH (ref 8–23)
CO2: 27 mmol/L (ref 22–32)
Calcium: 8.7 mg/dL — ABNORMAL LOW (ref 8.9–10.3)
Chloride: 104 mmol/L (ref 98–111)
Creatinine: 1.33 mg/dL — ABNORMAL HIGH (ref 0.61–1.24)
GFR, Estimated: >60 mL/min (ref >60)
Glucose, Bld: 89 mg/dL (ref 70–99)
Potassium: 3.9 mmol/L (ref 3.5–5.1)
Sodium: 139 mmol/L (ref 135–145)
Total Bilirubin: 0.4 mg/dL (ref 0.3–1.2)
Total Protein: 6.2 g/dL — ABNORMAL LOW (ref 6.5–8.1)

## 2023-03-01 LAB — LACTATE DEHYDROGENASE: LDH: 203 U/L — ABNORMAL HIGH (ref 98–192)

## 2023-03-01 LAB — FERRITIN: Ferritin: 265 ng/mL (ref 24–336)

## 2023-03-01 LAB — RETICULOCYTES
Immature Retic Fract: 28 % — ABNORMAL HIGH (ref 2.3–15.9)
RBC.: 2.78 MIL/uL — ABNORMAL LOW (ref 4.22–5.81)
Retic Count, Absolute: 70.6 10*3/uL (ref 19.0–186.0)
Retic Ct Pct: 2.5 % (ref 0.4–3.1)

## 2023-03-01 MED ORDER — VINBLASTINE SULFATE CHEMO INJECTION 1 MG/ML
6.0000 mg/m2 | Freq: Once | INTRAVENOUS | Status: AC
  Administered 2023-03-01: 11.9 mg via INTRAVENOUS
  Filled 2023-03-01: qty 11.9

## 2023-03-01 MED ORDER — PALONOSETRON HCL INJECTION 0.25 MG/5ML
0.2500 mg | Freq: Once | INTRAVENOUS | Status: AC
  Administered 2023-03-01: 0.25 mg via INTRAVENOUS
  Filled 2023-03-01: qty 5

## 2023-03-01 MED ORDER — HOT PACK MISC ONCOLOGY: 1.0000 | Freq: Once | Status: DC | PRN

## 2023-03-01 MED ORDER — LACTATED RINGERS IV SOLN
250.0000 mg/m2 | Freq: Once | INTRAVENOUS | Status: AC
  Administered 2023-03-01: 500 mg via INTRAVENOUS
  Filled 2023-03-01: qty 50

## 2023-03-01 MED ORDER — SODIUM CHLORIDE 0.9 % IV SOLN
150.0000 mg | Freq: Once | INTRAVENOUS | Status: AC
  Administered 2023-03-01: 150 mg via INTRAVENOUS
  Filled 2023-03-01: qty 150

## 2023-03-01 MED ORDER — SODIUM CHLORIDE 0.9 % IV SOLN
240.0000 mg | Freq: Once | INTRAVENOUS | Status: AC
  Administered 2023-03-01: 240 mg via INTRAVENOUS
  Filled 2023-03-01: qty 24

## 2023-03-01 MED ORDER — HEPARIN SOD (PORK) LOCK FLUSH 100 UNIT/ML IV SOLN
500.0000 [IU] | Freq: Once | INTRAVENOUS | Status: AC | PRN
  Administered 2023-03-01: 500 [IU]

## 2023-03-01 MED ORDER — DOXORUBICIN HCL CHEMO IV INJECTION 2 MG/ML
25.0000 mg/m2 | Freq: Once | INTRAVENOUS | Status: AC
  Administered 2023-03-01: 50 mg via INTRAVENOUS
  Filled 2023-03-01: qty 25

## 2023-03-01 MED ORDER — SODIUM CHLORIDE 0.9 % IV SOLN
10.0000 mg | Freq: Once | INTRAVENOUS | Status: AC
  Administered 2023-03-01: 10 mg via INTRAVENOUS
  Filled 2023-03-01: qty 10

## 2023-03-01 MED ORDER — COLD PACK MISC ONCOLOGY: 1.0000 | Freq: Once | Status: DC | PRN

## 2023-03-01 MED ORDER — SODIUM CHLORIDE 0.9 % IV SOLN
375.0000 mg/m2 | Freq: Once | INTRAVENOUS | Status: AC
  Administered 2023-03-01: 800 mg via INTRAVENOUS
  Filled 2023-03-01: qty 80

## 2023-03-01 MED ORDER — SODIUM CHLORIDE 0.9 % IV SOLN: Freq: Once | INTRAVENOUS | Status: AC

## 2023-03-01 MED ORDER — SODIUM CHLORIDE 0.9% FLUSH
10.0000 mL | INTRAVENOUS | Status: DC | PRN
  Administered 2023-03-01: 10 mL

## 2023-03-01 NOTE — Progress Notes (Signed)
Hematology and Oncology Follow Up Visit  Lenise ArenaBrian T Corona [id number] [birth year] 62 y.o. 03/01/2023   Principle Diagnosis:  Recurrent Hodgkin's Disease -  S/p CAR-T therapy -- relapsed TIA-resolved Iron deficiency anemia-iron malabsorption  Current Therapy:  ANVD -- s/p cycle #4 -- start on 10/25/2022 Nivolumab q 3 month -- s/p cycle #18 -- on hold XRT -- Involved field --completed on 08/06/2022  Neulasta 6 mg IM post each chemotherapy   IV iron-Monoferric-given on 03/04/2023     Interim History:  Mr.  Adriana SimasCook is in for follow-up.  Everything looks quite good.  His face does not look nearly as affected by the nivolumab.  He has been on doxycycline.  I told him that he could certainly try over-the-counter steroid cream.  He still has a Parkinson's.  His blood pressure little on the lower side.  He is exercising.  He is eating well.  1 issues that he is becoming more anemic.  His hemoglobin is 8.5 today.  He is really not symptomatic with this.  However, going to have to follow this closely.  I sent off iron studies and erythropoietin level on him.  We will have to see what they come back as.  His girlfriend is make sure that he has a lot of iron in his diet.  He has had no fever.  He has had no mouth sores.  He has had no change in bowel or bladder habits.  Overall, I would say that his performance status is probably ECOG 1.       Medications:  Current Outpatient Medications:    AMBULATORY NON FORMULARY MEDICATION, Please dispense one abdominal compression binder, Disp: 1 Device, Rfl: 0   carbidopa-levodopa (SINEMET CR) 50-200 MG tablet, TAKE 1 TABLET BY MOUTH AT BEDTIME, Disp: 90 tablet, Rfl: 0   carbidopa-levodopa (SINEMET IR) 25-100 MG tablet, TAKE 2 TABLETS AT 7AM, 11AM, 3 PM and 7PM (Patient taking differently: 3 (three) times daily. 02/08/2023 TAKE 2 TABLETS AT 7AM, 11AM, 3 PM.), Disp: 720 tablet, Rfl: 0   cholecalciferol (VITAMIN D3) 25 MCG (1000 UNIT) tablet, Take 1,000 Units by mouth  daily., Disp: , Rfl:    dexamethasone (DECADRON) 4 MG tablet, Take 2 tablets (8 mg) by mouth daily for 3 days after chemotherapy. Take with food., Disp: 90 tablet, Rfl: 3   diphenhydramine-acetaminophen (TYLENOL PM) 25-500 MG TABS tablet, Take 1 tablet by mouth at bedtime as needed., Disp: , Rfl:    dronabinol (MARINOL) 5 MG capsule, Take 1 capsule (5 mg total) by mouth 2 (two) times daily before a meal. (Patient not taking: Reported on 02/08/2023), Disp: 60 capsule, Rfl: 0   pramipexole (MIRAPEX) 0.5 MG tablet, TAKE 1 TABLET(0.5 MG) BY MOUTH THREE TIMES DAILY, Disp: 270 tablet, Rfl: 0  Allergies: No Known Allergies  Past Medical History, Surgical history, Social history, and Family History were reviewed and updated.  Review of Systems: Review of Systems  Neurological:  Positive for tremors.  All other systems reviewed and are negative.    Physical Exam:  height is 5\' 10"  (1.778 m) and weight is 171 lb (77.6 kg). His oral temperature is 97.8 F (36.6 C). His blood pressure is 98/62 and his pulse is 85. His respiration is 18 and oxygen saturation is 97%.   Physical Exam Vitals reviewed.  HENT:     Head: Normocephalic and atraumatic.  Eyes:     Pupils: Pupils are equal, round, and reactive to light.  Cardiovascular:     Rate and Rhythm:  Normal rate and regular rhythm.     Heart sounds: Normal heart sounds.  Pulmonary:     Effort: Pulmonary effort is normal.     Breath sounds: Normal breath sounds.  Abdominal:     General: Bowel sounds are normal.     Palpations: Abdomen is soft.  Musculoskeletal:        General: No tenderness or deformity. Normal range of motion.     Cervical back: Normal range of motion.  Lymphadenopathy:     Cervical: No cervical adenopathy.  Skin:    General: Skin is warm and dry.     Findings: No erythema or rash.  Neurological:     Mental Status: He is alert and oriented to person, place, and time.  Psychiatric:        Behavior: Behavior normal.         Thought Content: Thought content normal.        Judgment: Judgment normal.    Lab Results  Component Value Date   WBC 3.6 (L) 03/01/2023   HGB 8.5 (L) 03/01/2023   HCT 27.6 (L) 03/01/2023   MCV 98.9 03/01/2023   PLT 273 03/01/2023     Chemistry      Component Value Date/Time   NA 139 03/01/2023 1000   NA 142 04/02/2017 0000   NA 140 07/19/2016 1305   K 3.9 03/01/2023 1000   K 4.4 07/19/2016 1305   CL 104 03/01/2023 1000   CL 105 02/08/2016 1117   CL 107 03/25/2013 0828   CO2 27 03/01/2023 1000   CO2 26 07/19/2016 1305   BUN 29 (H) 03/01/2023 1000   BUN 21 04/02/2017 0000   BUN 22.8 07/19/2016 1305   CREATININE 1.33 (H) 03/01/2023 1000   CREATININE 1.1 07/19/2016 1305   GLU 105 04/02/2017 0000      Component Value Date/Time   CALCIUM 8.7 (L) 03/01/2023 1000   CALCIUM 9.8 07/19/2016 1305   ALKPHOS 65 03/01/2023 1000   ALKPHOS 76 07/19/2016 1305   AST 16 03/01/2023 1000   AST 18 07/19/2016 1305   ALT 6 03/01/2023 1000   ALT 14 07/19/2016 1305   BILITOT 0.4 03/01/2023 1000   BILITOT 0.70 07/19/2016 1305      Impression and Plan: Mr. Bowsher is a 62 year old gentleman.  He has history of recurrent Hodgkin's disease. 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 He then had a recurrence after this.  He did undergo CAR[facility]T therapy at [facility].  I believe he had this in April 2017.  He did well with this.  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  This was proven by biopsy.  He had radiation therapy for this.  He subsequently has had another recurrence.  This was in the right axilla.  This was biopsy-proven.  He did undergo radiation therapy for this.  This was completed in September/2023.  Now, we have another recurrence.    He currently is on chemotherapy with ANVB.  I think he is doing well with this.  His last PET scan showed that he was in remission.  We will have to watch him closely with respect to the anemia.  He will have to  come back on Monday for a Neulasta injection.  We will plan to get him back in 2 weeks for his next treatment.      [name], MD 4/5/202411:28 AM  ADDENDUM: He is iron deficient.  His iron saturation low  at 10%.  I am sure this is from malabsorption.  We can have to give him some IV iron.  I think this will help get his iron level back up and get his hemoglobin better.  Hopefully, we will be able to use Monoferric as our iron supplement.  Christin BachPete Jemia Fata, MD

## 2023-03-01 NOTE — Patient Instructions (Signed)

## 2023-03-01 NOTE — Addendum Note (Signed)
Addended by: Arlan Organ R on: 03/01/2023 02:58 PM   Modules accepted: Orders

## 2023-03-01 NOTE — Patient Instructions (Signed)
[name]  Discharge Instructions: Thank you for choosing [name] to provide your oncology and hematology care.   If you have a lab appointment with the [name], please go directly to the [name] and check in at the registration area.  Wear comfortable clothing and clothing appropriate for easy access to any Portacath or PICC line.   We strive to give you quality time with your provider. You may need to reschedule your appointment if you arrive late (15 or more minutes).  Arriving late affects you and other patients whose appointments are after yours.  Also, if you miss three or more appointments without notifying the office, you may be dismissed from the clinic at the provider's discretion.      For prescription refill requests, have your pharmacy contact our office and allow 72 hours for refills to be completed.    Today you received the following chemotherapy and/or immunotherapy agents Opdivo, Adriamycin,  Zinecard, DTIC, Velban.     To help prevent nausea and vomiting after your treatment, we encourage you to take your nausea medication as directed.  BELOW ARE SYMPTOMS THAT SHOULD BE REPORTED IMMEDIATELY: *FEVER GREATER THAN 100.4 F (38 C) OR HIGHER *CHILLS OR SWEATING *NAUSEA AND VOMITING THAT IS NOT CONTROLLED WITH YOUR NAUSEA MEDICATION *UNUSUAL SHORTNESS OF BREATH *UNUSUAL BRUISING OR BLEEDING *URINARY PROBLEMS (pain or burning when urinating, or frequent urination) *BOWEL PROBLEMS (unusual diarrhea, constipation, pain near the anus) TENDERNESS IN MOUTH AND THROAT WITH OR WITHOUT PRESENCE OF ULCERS (sore throat, sores in mouth, or a toothache) UNUSUAL RASH, SWELLING OR PAIN  UNUSUAL VAGINAL DISCHARGE OR ITCHING   Items with * indicate a potential emergency and should be followed up as soon as possible or go to the Emergency Department if any problems should occur.  Please show the CHEMOTHERAPY ALERT CARD or  IMMUNOTHERAPY ALERT CARD at check-in to the Emergency Department and triage nurse. Should you have questions after your visit or need to cancel or reschedule your appointment, please contact [name] [place]  [phone] and follow the prompts.  Office hours are 8:00 a.m. to 4:30 p.m. Monday - Friday. Please note that voicemails left after 4:00 p.m. may not be returned until the following business day.  We are closed weekends and major holidays. You have access to a nurse at all times for urgent questions. Please call the main number to the clinic [phone] and follow the prompts.  For any non-urgent questions, you may also contact your provider using MyChart. We now offer e-Visits for anyone 81 and older to request care online for non-urgent symptoms. For details visit [email or web address].   Also download the MyChart app! Go to the app store, search "MyChart", open the app, select [facility], and log in with your MyChart username and password.

## 2023-03-03 LAB — ERYTHROPOIETIN: Erythropoietin: 25 m[IU]/mL — ABNORMAL HIGH (ref 2.6–18.5)

## 2023-03-04 ENCOUNTER — Inpatient Hospital Stay: Payer: Commercial Managed Care - PPO

## 2023-03-04 ENCOUNTER — Encounter: Payer: Self-pay | Admitting: Hematology & Oncology

## 2023-03-04 VITALS — BP 157/92 | HR 85 | Temp 97.6°F | Resp 18

## 2023-03-04 DIAGNOSIS — Z5111 Encounter for antineoplastic chemotherapy: Secondary | ICD-10-CM | POA: Diagnosis not present

## 2023-03-04 DIAGNOSIS — C8195 Hodgkin lymphoma, unspecified, lymph nodes of inguinal region and lower limb: Secondary | ICD-10-CM

## 2023-03-04 DIAGNOSIS — K909 Intestinal malabsorption, unspecified: Secondary | ICD-10-CM

## 2023-03-04 DIAGNOSIS — R634 Abnormal weight loss: Secondary | ICD-10-CM

## 2023-03-04 MED ORDER — SODIUM CHLORIDE 0.9 % IV SOLN: Freq: Once | INTRAVENOUS | Status: AC

## 2023-03-04 MED ORDER — SODIUM CHLORIDE 0.9 % IV SOLN
300.0000 mg | Freq: Once | INTRAVENOUS | Status: AC
  Administered 2023-03-04: 300 mg via INTRAVENOUS
  Filled 2023-03-04: qty 300

## 2023-03-04 MED ORDER — PEGFILGRASTIM-CBQV 6 MG/0.6ML ~~LOC~~ SOSY
6.0000 mg | PREFILLED_SYRINGE | Freq: Once | SUBCUTANEOUS | Status: AC
  Administered 2023-03-04: 6 mg via SUBCUTANEOUS
  Filled 2023-03-04: qty 0.6

## 2023-03-04 MED ORDER — SODIUM CHLORIDE 0.9% FLUSH
10.0000 mL | Freq: Once | INTRAVENOUS | Status: AC | PRN
  Administered 2023-03-04: 10 mL

## 2023-03-04 MED ORDER — HEPARIN SOD (PORK) LOCK FLUSH 100 UNIT/ML IV SOLN
500.0000 [IU] | Freq: Once | INTRAVENOUS | Status: AC | PRN
  Administered 2023-03-04: 500 [IU]

## 2023-03-04 NOTE — Patient Instructions (Signed)
Pegfilgrastim Injection What is this medication? PEGFILGRASTIM (PEG fil gra stim) lowers the risk of infection in people who are receiving chemotherapy. It works by helping your body make more white blood cells, which protects your body from infection. It may also be used to help people who have been exposed to high doses of radiation. This medicine may be used for other purposes; ask your health care provider or pharmacist if you have questions. COMMON BRAND NAME(S): Fulphila, Fylnetra, Neulasta, Nyvepria, Stimufend, UDENYCA, Ziextenzo What should I tell my care team before I take this medication? They need to know if you have any of these conditions: Kidney disease Latex allergy Ongoing radiation therapy Sickle cell disease Skin reactions to acrylic adhesives (On-Body Injector only) An unusual or allergic reaction to pegfilgrastim, filgrastim, other medications, foods, dyes, or preservatives Pregnant or trying to get pregnant Breast-feeding How should I use this medication? This medication is for injection under the skin. If you get this medication at home, you will be taught how to prepare and give the pre-filled syringe or how to use the On-body Injector. Refer to the patient Instructions for Use for detailed instructions. Use exactly as directed. Tell your care team immediately if you suspect that the On-body Injector may not have performed as intended or if you suspect the use of the On-body Injector resulted in a missed or partial dose. It is important that you put your used needles and syringes in a special sharps container. Do not put them in a trash can. If you do not have a sharps container, call your pharmacist or care team to get one. Talk to your care team about the use of this medication in children. While this medication may be prescribed for selected conditions, precautions do apply. Overdosage: If you think you have taken too much of this medicine contact a poison control center  or emergency room at once. NOTE: This medicine is only for you. Do not share this medicine with others. What if I miss a dose? It is important not to miss your dose. Call your care team if you miss your dose. If you miss a dose due to an On-body Injector failure or leakage, a new dose should be administered as soon as possible using a single prefilled syringe for manual use. What may interact with this medication? Interactions have not been studied. This list may not describe all possible interactions. Give your health care provider a list of all the medicines, herbs, non-prescription drugs, or dietary supplements you use. Also tell them if you smoke, drink alcohol, or use illegal drugs. Some items may interact with your medicine. What should I watch for while using this medication? Your condition will be monitored carefully while you are receiving this medication. You may need blood work done while you are taking this medication. Talk to your care team about your risk of cancer. You may be more at risk for certain types of cancer if you take this medication. If you are going to need a MRI, CT scan, or other procedure, tell your care team that you are using this medication (On-Body Injector only). What side effects may I notice from receiving this medication? Side effects that you should report to your care team as soon as possible: Allergic reactions--skin rash, itching, hives, swelling of the face, lips, tongue, or throat Capillary leak syndrome--stomach or muscle pain, unusual weakness or fatigue, feeling faint or lightheaded, decrease in the amount of urine, swelling of the ankles, hands, or feet, trouble   breathing High white blood cell level--fever, fatigue, trouble breathing, night sweats, change in vision, weight loss Inflammation of the aorta--fever, fatigue, back, chest, or stomach pain, severe headache Kidney injury (glomerulonephritis)--decrease in the amount of urine, red or dark brown  urine, foamy or bubbly urine, swelling of the ankles, hands, or feet Shortness of breath or trouble breathing Spleen injury--pain in upper left stomach or shoulder Unusual bruising or bleeding Side effects that usually do not require medical attention (report to your care team if they continue or are bothersome): Bone pain Pain in the hands or feet This list may not describe all possible side effects. Call your doctor for medical advice about side effects. You may report side effects to FDA at 1-800-FDA-1088. Where should I keep my medication? Keep out of the reach of children. If you are using this medication at home, you will be instructed on how to store it. Throw away any unused medication after the expiration date on the label. NOTE: This sheet is a summary. It may not cover all possible information. If you have questions about this medicine, talk to your doctor, pharmacist, or health care provider.  2023 Elsevier/Gold Standard (2021-07-28 00:00:00) Iron Sucrose Injection What is this medication? IRON SUCROSE (EYE ern SOO krose) treats low levels of iron (iron deficiency anemia) in people with kidney disease. Iron is a mineral that plays an important role in making red blood cells, which carry oxygen from your lungs to the rest of your body. This medicine may be used for other purposes; ask your health care provider or pharmacist if you have questions. COMMON BRAND NAME(S): Venofer What should I tell my care team before I take this medication? They need to know if you have any of these conditions: Anemia not caused by low iron levels Heart disease High levels of iron in the blood Kidney disease Liver disease An unusual or allergic reaction to iron, other medications, foods, dyes, or preservatives Pregnant or trying to get pregnant Breastfeeding How should I use this medication? This medication is for infusion into a vein. It is given in a hospital or clinic setting. Talk to your  care team about the use of this medication in children. While this medication may be prescribed for children as young as 2 years for selected conditions, precautions do apply. Overdosage: If you think you have taken too much of this medicine contact a poison control center or emergency room at once. NOTE: This medicine is only for you. Do not share this medicine with others. What if I miss a dose? Keep appointments for follow-up doses. It is important not to miss your dose. Call your care team if you are unable to keep an appointment. What may interact with this medication? Do not take this medication with any of the following: Deferoxamine Dimercaprol Other iron products This medication may also interact with the following: Chloramphenicol Deferasirox This list may not describe all possible interactions. Give your health care provider a list of all the medicines, herbs, non-prescription drugs, or dietary supplements you use. Also tell them if you smoke, drink alcohol, or use illegal drugs. Some items may interact with your medicine. What should I watch for while using this medication? Visit your care team regularly. Tell your care team if your symptoms do not start to get better or if they get worse. You may need blood work done while you are taking this medication. You may need to follow a special diet. Talk to your care team. Foods that  contain iron include: whole grains/cereals, dried fruits, beans, or peas, leafy green vegetables, and organ meats (liver, kidney). What side effects may I notice from receiving this medication? Side effects that you should report to your care team as soon as possible: Allergic reactions--skin rash, itching, hives, swelling of the face, lips, tongue, or throat Low blood pressure--dizziness, feeling faint or lightheaded, blurry vision Shortness of breath Side effects that usually do not require medical attention (report to your care team if they continue or are  bothersome): Flushing Headache Joint pain Muscle pain Nausea Pain, redness, or irritation at injection site This list may not describe all possible side effects. Call your doctor for medical advice about side effects. You may report side effects to FDA at 1-800-FDA-1088. Where should I keep my medication? This medication is given in a hospital or clinic and will not be stored at home. NOTE: This sheet is a summary. It may not cover all possible information. If you have questions about this medicine, talk to your doctor, pharmacist, or health care provider.  2023 Elsevier/Gold Standard (2021-02-23 00:00:00)

## 2023-03-07 ENCOUNTER — Encounter (HOSPITAL_COMMUNITY)
Admission: RE | Admit: 2023-03-07 | Discharge: 2023-03-07 | Disposition: A | Discharge status: Home / Self Care | Payer: Commercial Managed Care - PPO | Source: Ambulatory Visit | Attending: Hematology & Oncology | Admitting: Hematology & Oncology

## 2023-03-07 DIAGNOSIS — C8195 Hodgkin lymphoma, unspecified, lymph nodes of inguinal region and lower limb: Secondary | ICD-10-CM | POA: Insufficient documentation

## 2023-03-07 LAB — GLUCOSE, CAPILLARY: Glucose-Capillary: 105 mg/dL — ABNORMAL HIGH (ref 70–99)

## 2023-03-07 MED ORDER — FLUDEOXYGLUCOSE F - 18 (FDG) INJECTION
9.0000 | Freq: Once | INTRAVENOUS | Status: AC | PRN
  Administered 2023-03-07: 8.6 via INTRAVENOUS

## 2023-03-11 ENCOUNTER — Encounter: Payer: Self-pay | Admitting: *Deleted

## 2023-03-18 ENCOUNTER — Ambulatory Visit: Payer: Commercial Managed Care - PPO | Attending: Neurology | Admitting: Physical Therapy

## 2023-03-18 ENCOUNTER — Encounter: Payer: Self-pay | Admitting: Physical Therapy

## 2023-03-18 DIAGNOSIS — R2681 Unsteadiness on feet: Secondary | ICD-10-CM | POA: Insufficient documentation

## 2023-03-18 DIAGNOSIS — M6281 Muscle weakness (generalized): Secondary | ICD-10-CM | POA: Diagnosis present

## 2023-03-18 DIAGNOSIS — R2689 Other abnormalities of gait and mobility: Secondary | ICD-10-CM | POA: Diagnosis present

## 2023-03-18 NOTE — Therapy (Signed)
OUTPATIENT PHYSICAL THERAPY NEURO TREATMENT NOTE   Patient Name: Brian [name] MRN: [id number] DOB:Jul 02, 1961, 62 y.o., male Today's Date: 03/19/2023   PCP: Abbe Amsterdam, MD REFERRING PROVIDER: Kerin Salen, DO  END OF SESSION:  PT End of Session - 03/18/23 1319     Visit Number 3    Number of Visits 5    Date for PT Re-Evaluation 05/03/23    Authorization Type [employer] (from [facility])    PT Start Time 1321    PT Stop Time 1401    PT Time Calculation (min) 40 min    Activity Tolerance Patient tolerated treatment well    Behavior During Therapy [facility] for tasks assessed/performed             Past Medical History:  Diagnosis Date   Anxiety    Dysrhythmia    past hx pvc   Goals of care, counseling/discussion 09/25/2018   H/O autologous stem cell transplant    may 2014  at [facility]   History of Bell's palsy    2009  RIGHT SIDE--  HAS 80% FUNCTION / PT STATES A LITTLE ASYMETRICAL AND EFFECTS MOUTH   History of radiation therapy 10/18/17-11/05/17   sprine T1 26 Gy in 13 fractions, spine boost 10 Gy in 5 fractions   History of radiation therapy    Right hip, Rt Sclav- 07/23/22-08/07/11- Dr. 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   Mass of right chest wall 09/18/2018   Mass of right inguinal region    Neuromuscular disorder    bilateral feet neuropathy   PVC (premature ventricular contraction)    "benign"   TIA (transient ischemic attack) 02/2015   "probable TIA"   Wears contact lenses    Past Surgical History:  Procedure Laterality Date   AXILLARY LYMPH NODE BIOPSY Left 02/02/2013   Procedure: NEEDLE LOCALIZED AXILLARY LYMPH NODE BIOPSY;  Surgeon: [name], MD;  Location: [facility];  Service: General;  Laterality: Left;   BACK SURGERY  03/2021   BONE  MARROW BIOPSY  08/26/2012   COLONOSCOPY  2021   CYST REMOVAL NECK Left 11/26/1978   IR IMAGING GUIDED PORT INSERTION  10/23/2022   LEFT INGUINAL LYMPH NODE BX  09/09/2012   LYMPH NODE BIOPSY N/A 07/28/2015   Procedure: LYMPH NODE BIOPSY;  Surgeon: Loreli Slot, MD;  Location: [facility] OR;  Service: Thoracic;  Laterality: N/A;   LYMPH NODE BIOPSY Right 06/29/2022   Procedure: RIGHT AXILLARY NODE BIOPSY;  Surgeon: Loreli Slot, MD;  Location: [facility] OR;  Service: Vascular;  Laterality: Right;   MASS EXCISION Right 09/19/2018   Procedure: EXCISION OF RIGHT CHEST WALL MASS ERAS PATHWAY;  Surgeon: Claud Kelp, MD;  Location: [facility];  Service: General;  Laterality: Right;   NODE DISSECTION Right 07/28/2015   Procedure: NODE DISSECTION;  Surgeon: Loreli Slot, MD;  Location: [facility] OR;  Service: Thoracic;  Laterality: Right;   PLEURA BIOPSY Left 05/31/2014   REMOVAL RIGHT INGUINAL LYMPH NODES  08/16/2011   SCROTAL EXPLORATION Right 01/04/2014   Procedure: SCROTUM EXPLORATION   INGUINAL , EXCISION OF CYSTIC MASS OF RIGHT SPERMATIC CORD, WITH FROZEN SECTION;  Surgeon: Marcine Matar, MD;  Location: [facility];  Service: Urology;  Laterality: Right;  TRANSTHORACIC ECHOCARDIOGRAM  03/18/2013   MILD LVH/  EF 55-60%   VIDEO ASSISTED THORACOSCOPY Left 05/31/2014   Procedure: LEFT VIDEO ASSISTED THORACOSCOPY, PLEURAL BIOPSY;  Surgeon: Loreli Slot, MD;  Location: [facility] OR;  Service: Thoracic;  Laterality: Left;   VIDEO ASSISTED THORACOSCOPY Right 07/28/2015   Procedure: RIGHT VIDEO ASSISTED THORACOSCOPY;  Surgeon: Loreli Slot, MD;  Location: [facility] OR;  Service: Thoracic;  Laterality: Right;   VIDEO ASSISTED THORACOSCOPY (VATS)/ LYMPH NODE SAMPLING Right 07/28/2015   VIDEO ASSISTED THORACOSCOPY (VATS)/WEDGE RESECTION  05/31/2014   VIDEO BRONCHOSCOPY WITH ENDOBRONCHIAL ULTRASOUND  07/28/2015   VIDEO BRONCHOSCOPY WITH ENDOBRONCHIAL ULTRASOUND N/A  07/28/2015   Procedure: VIDEO BRONCHOSCOPY WITH ENDOBRONCHIAL ULTRASOUND;  Surgeon: Loreli Slot, MD;  Location: [facility] OR;  Service: Thoracic;  Laterality: N/A;   Patient Active Problem List   Diagnosis Date Noted   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   Primary hypertension 09/12/2022   Closed fracture of right proximal humerus 10/03/2021   Traumatic complete tear of right rotator cuff 10/03/2021   Lumbar radiculopathy 10/03/2021   Goals of care, counseling/discussion 09/25/2018   Mass of right chest wall 09/18/2018   PD (Parkinson's disease) 11/27/2016   Lymphadenopathy 07/28/2015   Mediastinal adenopathy 07/14/2015   Aphasia 03/04/2015   Slurred speech 03/04/2015   Pleural mass 05/31/2014   Abdominal pain, epigastric 02/02/2014   Weight loss 08/12/2012   H/O Bell's palsy 08/12/2012   Hodgkin's disease 08/09/2011    ONSET DATE: 01/02/2023 (MD referral)  REFERRING DIAG: G20.B2 (ICD-10-CM) - Parkinson's disease with dyskinesia and fluctuating manifestations   THERAPY DIAG:  Unsteadiness on feet  Other abnormalities of gait and mobility  Muscle weakness (generalized)  Rationale for Evaluation and Treatment: Rehabilitation  SUBJECTIVE:                                                                                                                                                                                             SUBJECTIVE STATEMENT: Clear scan for the Hodgkins.  Have two more rounds of chemo.   No falls but a few stumbles. Pt accompanied by: self  PERTINENT HISTORY: previous d/c from PT 11/08/22, due to ongoing treatment for Hodgkin's disease (recurrent)-see PMH above  PAIN:  Are you having pain? Yes: NPRS scale: 4-5/10 Pain location: back  Pain description: consistent Aggravating factors: just there Relieving factors: stretching *PT will monitor, but will not address as a goal at this time, as pain is longstanding and he manages with daily  stretches.  PRECAUTIONS: Fall and Other: currently on chemo  WEIGHT BEARING RESTRICTIONS: No  FALLS: Has patient fallen in last 6 months?  Yes. Number of falls 2-3  LIVING ENVIRONMENT: Lives with: lives with their family and significant other Lives in: House/apartment Stairs: 2 Has following equipment at home: Single point cane, Environmental consultant - 2 wheeled, and Wheelchair (manual)  PLOF: Independent and recently retired; goes to [employer] -uses stationary bike  PATIENT GOALS: Looking for ways for back to not be painful  OBJECTIVE:       TODAY'S TREATMENT: 03/18/2023 Activity Comments  Reviewed additions to HEP Cues for technique  Forward step and weightshift, over obstacle 2 x 10 Cues for deliberate foot clearance initially  Side step and weightshift, over obstacle, 2 x 10   Forward/back walking Cues for widened BOS  Gait with single walking pole indoor clinic and outdoor sidewalk surfaces, 100 ft x 3 Cues for initial placement and sequencing; discussed benefits of consistent use of single walking pole for auditory cue and for stability        PATIENT EDUCATION: Education details: reviewed tips to reduce hastening of/freezing of gait (see instructions for details), benefits of using single walking pole Person educated: Patient Education method: Explanation, Demonstration, and Handouts Education comprehension: verbalized understanding, returned demonstration, and needs further education  Access Code: [facility] URL: [email or web address] Date: 02/19/2023 Prepared by: [facility] - Outpatient  Rehab - [facility]  Exercises - Seated Hamstring stretch  - 1-2 x daily - 7 x weekly - 1 sets - 3 reps - 30 sec hold - Standing Gastroc Stretch at Counter  - 1-2 x daily - 7 x weekly - 1 sets - 3 reps - 30 sec hold - Sit to Stand  - 1 x daily - 7 x weekly - 2 sets - 10 reps - Mini Squat  - 1 x daily - 5 x weekly - 1-2 sets - 10 reps - Side Stepping with Counter Support   - 1 x daily - 5 x weekly - 1-2 sets - 10 reps - Alternating Step forward-Balance Reaction  - 1 x daily - 5 x weekly - 1-2 sets - 10 reps   ---------------------------------------------------------------- Objective measures taken at initial evaluation:  DIAGNOSTIC FINDINGS: NA for PD at this time  COGNITION: Overall cognitive status: Within functional limits for tasks assessed   SENSATION: Light touch: intact to light touch, but pt does have hx of neuropathy Reports neuropathy in bilat feet  POSTURE: rounded shoulders, forward head, and dyskinesias  LOWER EXTREMITY ROM:   tightness in L hamstrings  LOWER EXTREMITY MMT:    MMT Right Eval Left Eval  Hip flexion 4+ 4+  Hip extension    Hip abduction    Hip adduction    Hip internal rotation    Hip external rotation    Knee flexion 4 4  Knee extension 4 4  Ankle dorsiflexion 3+ 3+  Ankle plantarflexion    Ankle inversion    Ankle eversion    (Blank rows = not tested)   TRANSFERS: Assistive device utilized: None  Sit to stand: Modified independence Stand to sit: Modified independence  GAIT: Gait pattern: step through pattern, decreased step length- Right, decreased step length- Left, decreased ankle dorsiflexion- Right, decreased ankle dorsiflexion- Left, festinating, narrow BOS, poor foot clearance- Right, and poor foot clearance- Left Distance walked: 50 ft x 2 Assistive device utilized:  single walking pole and None Level of assistance: Modified independence Comments: Cues to use walking pole for sequence-longer stride, improved heelstrike, and for posture reset if needed  FUNCTIONAL TESTS:  5 times sit to stand: 10.97 sec Timed  up and go (TUG): 11.84 sec 10 meter walk test: 9.22 sec= 3.56 ft/sec;  with cognitive task:  13.38 sec (2.45 ft/sec) TUG cognitive:  23.85 sec 45M back:  9.16 sec (1.09 ft/sec) Push and release:  Posterior:   Multiple small steps to recover  Anterior: Multiple small festinating steps to  recover  Lateral R and L:  Multiple small steps to recover   TODAY'S TREATMENT:                                                                                                                              DATE: 02/05/2023    PATIENT EDUCATION: Education details: PT eval results, POC; initial HEP-stretches Person educated: Patient Education method: Explanation, Demonstration, Verbal cues, and Handouts Education comprehension: verbalized understanding, returned demonstration, and needs further education  HOME EXERCISE PROGRAM: Access Code: [facility] URL: [email or web address] Date: 02/05/2023 Prepared by: [facility] - Outpatient  Rehab - [facility]  Exercises - Seated Hamstring stretch  - 1-2 x daily - 7 x weekly - 1 sets - 3 reps - 30 sec hold - Standing Gastroc Stretch at Counter  - 1-2 x daily - 7 x weekly - 1 sets - 3 reps - 30 sec hold  GOALS: Goals reviewed with patient? Yes  SHORT TERM GOALS: Target date: 03/08/2023  Pt will be independent with HEP for improved flexibility, balance, gait. Baseline: performing HEP and going to gym for use of bike and machines Goal status: GOAL MET, 03/18/2023  2.  Pt will verbalize/demo tips to reduce freezing episodes with use of walking pole. Baseline: stop, wide BOS, longer strides; sometimes it happens before I can stop it; tries to avoid crowds. Goal status: PARTIALLY MET  LONG TERM GOALS: Target date: 05/03/2023  Pt will be independent with progression of HEP for improved balance, flexibility, gait. Baseline:  Goal status: IN PROGRESS  2.  Pt will improve TUG/TUG cognitive score to less than or equal to 10% difference for decreased fall risk. Baseline: 11.84 sec/cog 23.85 sec Goal status: IN PROGRESS  3.  Pt will demonstrate balance recovery in posterior and anterior push and release test, in 2 or less steps. Baseline: multiple small steps Goal status: IN PROGRESS  4.  Pt will improve gait velocity with dual  cognitive task to at least 3 ft/sec Baseline: 2.45 ft/sec Goal status: IN PROGRESS  ASSESSMENT:  CLINICAL IMPRESSION: Assessed STGs this visit, with pt meeting STG 1 for HEP.  He has partially met STG 2 for tips for freezing reduction of gait.  He is able to verbalize, but continues to benefit from education of strategies and cueing to decrease these episodes, as he says he continues to have them.  He is continueing to have some back pain and is continuing to undergo treatments for Hodgkins disease; despite this, pt is performing HEP and going to gym for fitness.  He continues to report fluctuations in mobility, with freezing and festinating episodes.  He  will continue to benefit from skilled PT towards LTGs for improved mobility and decreased fall risk.    OBJECTIVE IMPAIRMENTS: Abnormal gait, decreased balance, decreased knowledge of use of DME, decreased mobility, difficulty walking, decreased strength, impaired flexibility, and postural dysfunction.   ACTIVITY LIMITATIONS: bending, sitting, standing, squatting, transfers, and locomotion level  PARTICIPATION LIMITATIONS: driving, community activity, and exercise  PERSONAL FACTORS: 3+ comorbidities: see above; currently undergoing chemo for Hodgkin's disease  are also affecting patient's functional outcome.   REHAB POTENTIAL: Good  CLINICAL DECISION MAKING: Evolving/moderate complexity  EVALUATION COMPLEXITY: Moderate  PLAN:  PT FREQUENCY: 1-2x/month  PT DURATION: 12 weeks  PLANNED INTERVENTIONS: Therapeutic exercises, Therapeutic activity, Neuromuscular re-education, Balance training, Gait training, Patient/Family education, Self Care, and DME instructions  PLAN FOR NEXT SESSION: Continue to work on step strategy exercises; stop/start/changes of direction with gait; dual tasking with gait..  Work on sequence of deliberate pace with walking pole and gait.  Provide information on optimal exercise routine and tips to reduce  freezing.  Gean Maidens., PT 03/19/2023, 5:19 PM  [facility] [address] [place], [address] [place], [place] [address] Phone # [phone] Fax # [phone]

## 2023-03-18 NOTE — Patient Instructions (Signed)
Tips to reduce freezing episodes with standing or walking:   Stand tall with your feet wide, so that you can rock and weight shift through your hips. Don't try to fight the freeze: if you begin taking slower, faster, smaller steps, STOP, get your posture tall, and RESET your posture and balance.  Take a deep breath before taking the BIG step to start again. March in place, with high knee stepping, to get started walking again. Use auditory cues:  Count out loud, think of a familiar tune or song or cadence, use pocket metronome, to use rhythm to get started walking again. Use visual cues:  Use a line to step over, use laser pointer line to step over, (using BIG steps) to start walking again. Use visual targets to keep your posture tall (look ahead and focus on an object or target at eye level). As you approach where your destination with walking, count your steps out loud and/or focus on your target with your eyes until you are fully there. Use appropriate assistive device, as advised by your physical therapist to assist with taking longer, consistent steps.   

## 2023-03-19 ENCOUNTER — Ambulatory Visit: Payer: Commercial Managed Care - PPO

## 2023-03-19 ENCOUNTER — Other Ambulatory Visit: Payer: Self-pay

## 2023-03-19 ENCOUNTER — Inpatient Hospital Stay: Payer: Commercial Managed Care - PPO

## 2023-03-19 ENCOUNTER — Encounter: Payer: Self-pay | Admitting: Medical Oncology

## 2023-03-19 ENCOUNTER — Inpatient Hospital Stay (HOSPITAL_BASED_OUTPATIENT_CLINIC_OR_DEPARTMENT_OTHER): Payer: Commercial Managed Care - PPO | Admitting: Medical Oncology

## 2023-03-19 ENCOUNTER — Ambulatory Visit: Payer: Commercial Managed Care - PPO | Admitting: Family

## 2023-03-19 ENCOUNTER — Other Ambulatory Visit: Payer: Commercial Managed Care - PPO

## 2023-03-19 DIAGNOSIS — C8195 Hodgkin lymphoma, unspecified, lymph nodes of inguinal region and lower limb: Secondary | ICD-10-CM

## 2023-03-19 DIAGNOSIS — Z5111 Encounter for antineoplastic chemotherapy: Secondary | ICD-10-CM | POA: Diagnosis not present

## 2023-03-19 LAB — LACTATE DEHYDROGENASE: LDH: 204 U/L — ABNORMAL HIGH (ref 98–192)

## 2023-03-19 LAB — CBC WITH DIFFERENTIAL (CANCER CENTER ONLY)
Abs Immature Granulocytes: 0.26 10*3/uL — ABNORMAL HIGH (ref 0.00–0.07)
Basophils Absolute: 0.1 10*3/uL (ref 0.0–0.1)
Basophils Relative: 1 %
Eosinophils Absolute: 0.2 10*3/uL (ref 0.0–0.5)
Eosinophils Relative: 2 %
HCT: 29.7 % — ABNORMAL LOW (ref 39.0–52.0)
Hemoglobin: 9.1 g/dL — ABNORMAL LOW (ref 13.0–17.0)
Immature Granulocytes: 3 %
Lymphocytes Relative: 10 %
Lymphs Abs: 0.8 10*3/uL (ref 0.7–4.0)
MCH: 30.4 pg (ref 26.0–34.0)
MCHC: 30.6 g/dL (ref 30.0–36.0)
MCV: 99.3 fL (ref 80.0–100.0)
Monocytes Absolute: 1 10*3/uL (ref 0.1–1.0)
Monocytes Relative: 13 %
Neutro Abs: 5.5 10*3/uL (ref 1.7–7.7)
Neutrophils Relative %: 71 %
Platelet Count: 312 10*3/uL (ref 150–400)
RBC: 2.99 MIL/uL — ABNORMAL LOW (ref 4.22–5.81)
RDW: 17.3 % — ABNORMAL HIGH (ref 11.5–15.5)
WBC Count: 7.8 10*3/uL (ref 4.0–10.5)
nRBC: 0 % (ref 0.0–0.2)

## 2023-03-19 LAB — FERRITIN: Ferritin: 516 ng/mL — ABNORMAL HIGH (ref 24–336)

## 2023-03-19 LAB — CMP (CANCER CENTER ONLY)
ALT: 2 U/L (ref 0–44)
Anion gap: 7 (ref 5–15)
CO2: 27 mmol/L (ref 22–32)
Creatinine: 1.27 mg/dL — ABNORMAL HIGH (ref 0.61–1.24)
Glucose, Bld: 88 mg/dL (ref 70–99)
Potassium: 4.5 mmol/L (ref 3.5–5.1)
Sodium: 139 mmol/L (ref 135–145)
Total Protein: 6.1 g/dL — ABNORMAL LOW (ref 6.5–8.1)

## 2023-03-19 LAB — IRON AND IRON BINDING CAPACITY (CC-WL,HP ONLY)
Iron: 71 ug/dL (ref 45–182)
Saturation Ratios: 22 % (ref 17.9–39.5)
TIBC: 325 ug/dL (ref 250–450)
UIBC: 254 ug/dL (ref 117–376)

## 2023-03-19 LAB — RETICULOCYTES
Immature Retic Fract: 27.5 % — ABNORMAL HIGH (ref 2.3–15.9)
RBC.: 2.98 MIL/uL — ABNORMAL LOW (ref 4.22–5.81)
Retic Count, Absolute: 65.6 10*3/uL (ref 19.0–186.0)
Retic Ct Pct: 2.2 % (ref 0.4–3.1)

## 2023-03-19 MED ORDER — VINBLASTINE SULFATE CHEMO INJECTION 1 MG/ML
6.0000 mg/m2 | Freq: Once | INTRAVENOUS | Status: AC
  Administered 2023-03-19: 11.9 mg via INTRAVENOUS
  Filled 2023-03-19: qty 11.9

## 2023-03-19 MED ORDER — HEPARIN SOD (PORK) LOCK FLUSH 100 UNIT/ML IV SOLN
500.0000 [IU] | Freq: Once | INTRAVENOUS | Status: AC | PRN
  Administered 2023-03-19: 500 [IU]

## 2023-03-19 MED ORDER — DOXORUBICIN HCL CHEMO IV INJECTION 2 MG/ML
25.0000 mg/m2 | Freq: Once | INTRAVENOUS | Status: AC
  Administered 2023-03-19: 50 mg via INTRAVENOUS
  Filled 2023-03-19: qty 25

## 2023-03-19 MED ORDER — SODIUM CHLORIDE 0.9 % IV SOLN
10.0000 mg | Freq: Once | INTRAVENOUS | Status: AC
  Administered 2023-03-19: 10 mg via INTRAVENOUS
  Filled 2023-03-19: qty 10

## 2023-03-19 MED ORDER — SODIUM CHLORIDE 0.9 % IV SOLN
240.0000 mg | Freq: Once | INTRAVENOUS | Status: AC
  Administered 2023-03-19: 240 mg via INTRAVENOUS
  Filled 2023-03-19: qty 24

## 2023-03-19 MED ORDER — SODIUM CHLORIDE 0.9 % IV SOLN: Freq: Once | INTRAVENOUS | Status: AC

## 2023-03-19 MED ORDER — SODIUM CHLORIDE 0.9 % IV SOLN
150.0000 mg | Freq: Once | INTRAVENOUS | Status: AC
  Administered 2023-03-19: 150 mg via INTRAVENOUS
  Filled 2023-03-19: qty 150

## 2023-03-19 MED ORDER — PALONOSETRON HCL INJECTION 0.25 MG/5ML
0.2500 mg | Freq: Once | INTRAVENOUS | Status: AC
  Administered 2023-03-19: 0.25 mg via INTRAVENOUS
  Filled 2023-03-19: qty 5

## 2023-03-19 MED ORDER — SODIUM CHLORIDE 0.9 % IV SOLN
375.0000 mg/m2 | Freq: Once | INTRAVENOUS | Status: AC
  Administered 2023-03-19: 800 mg via INTRAVENOUS
  Filled 2023-03-19: qty 80

## 2023-03-19 MED ORDER — SODIUM CHLORIDE 0.9% FLUSH
10.0000 mL | INTRAVENOUS | Status: AC | PRN
  Administered 2023-03-19: 10 mL

## 2023-03-19 MED ORDER — LACTATED RINGERS IV SOLN
250.0000 mg/m2 | Freq: Once | INTRAVENOUS | Status: AC
  Administered 2023-03-19: 500 mg via INTRAVENOUS
  Filled 2023-03-19: qty 50

## 2023-03-19 NOTE — Progress Notes (Addendum)
Hematology and Oncology Follow Up Visit  Brian [name] [id number] 1961-06-13 62 y.o. 03/19/2023   Principle Diagnosis:  Recurrent Hodgkin's Disease -  S/p CAR-T therapy -- relapsed TIA-resolved Iron deficiency anemia-iron malabsorption  Current Therapy:  ANVD -- s/p cycle #4 -- start on 10/25/2022 Nivolumab q 3 month -- s/p cycle #18 -- on hold XRT -- Involved field --completed on 08/06/2022  Neulasta 6 mg IM post each chemotherapy   IV iron-Monoferric-given on 03/04/2023     Interim History:  [name] is in for follow-up.  He is here with his longtime girlfriend.   He reports that he continues to tolerate the nivolumab fairly well. Last time he had his treatment plus iron plus a vaccine all within a few days of each other and felt a bit off but is back to normal.   Has somewhat new anemia which we are watching closely. This has appeared to respond well to Monoferric.   He is down a few pounds but reports that he often looses a few pounds then gains it back- they are cleaning up his diet which may be affecting this as well, night sweats, fever, cough, N/V/D/C.   Overall, I would say that his performance status is probably ECOG 1. Wt Readings from Last 3 Encounters:  03/19/23 167 lb 12.8 oz (76.1 kg)  03/01/23 171 lb (77.6 kg)  01/25/23 170 lb (77.1 kg)         Medications:  Current Outpatient Medications:    AMBULATORY NON FORMULARY MEDICATION, Please dispense one abdominal compression binder, Disp: 1 Device, Rfl: 0   carbidopa-levodopa (SINEMET CR) 50-200 MG tablet, TAKE 1 TABLET BY MOUTH AT BEDTIME, Disp: 90 tablet, Rfl: 0   carbidopa-levodopa (SINEMET IR) 25-100 MG tablet, TAKE 2 TABLETS AT 7AM, 11AM, 3 PM and 7PM (Patient taking differently: 3 (three) times daily. 02/08/2023 TAKE 2 TABLETS AT 7AM, 11AM, 3 PM.), Disp: 720 tablet, Rfl: 0   cholecalciferol (VITAMIN D3) 25 MCG (1000 UNIT) tablet, Take 1,000 Units by mouth daily., Disp: , Rfl:    dexamethasone (DECADRON) 4 MG  tablet, Take 2 tablets (8 mg) by mouth daily for 3 days after chemotherapy. Take with food., Disp: 90 tablet, Rfl: 3   diphenhydramine-acetaminophen (TYLENOL PM) 25-500 MG TABS tablet, Take 1 tablet by mouth at bedtime as needed., Disp: , Rfl:    pramipexole (MIRAPEX) 0.5 MG tablet, TAKE 1 TABLET(0.5 MG) BY MOUTH THREE TIMES DAILY, Disp: 270 tablet, Rfl: 0  Allergies: No Known Allergies  Past Medical History, Surgical history, Social history, and Family History were reviewed and updated.  Review of Systems: Review of Systems  Neurological:  Positive for tremors.  All other systems reviewed and are negative.    Physical Exam:  height is  (1.778 m) and weight is 167 lb 12.8 oz (76.1 kg). His oral temperature is 97.8 F (36.6 C). His blood pressure is 105/65 and his pulse is 88. His respiration is 18 and oxygen saturation is 97%.   Physical Exam Vitals reviewed.  HENT:     Head: Normocephalic and atraumatic.  Eyes:     Pupils: Pupils are equal, round, and reactive to light.  Cardiovascular:     Rate and Rhythm: Normal rate and regular rhythm.     Heart sounds: Normal heart sounds.  Pulmonary:     Effort: Pulmonary effort is normal.     Breath sounds: Normal breath sounds.  Abdominal:     General: Bowel sounds are normal.  Palpations: Abdomen is soft.  Musculoskeletal:        General: No tenderness or deformity. Normal range of motion.     Cervical back: Normal range of motion.  Lymphadenopathy:     Cervical: No cervical adenopathy.  Skin:    General: Skin is warm and dry.     Findings: No erythema or rash.  Neurological:     Mental Status: He is alert and oriented to person, place, and time.  Psychiatric:        Behavior: Behavior normal.        Thought Content: Thought content normal.        Judgment: Judgment normal.     Lab Results  Component Value Date   WBC 7.8 03/19/2023   HGB 9.1 (L) 03/19/2023   HCT 29.7 (L) 03/19/2023   MCV 99.3 03/19/2023   PLT  312 03/19/2023     Chemistry      Component Value Date/Time   NA 139 03/01/2023 1000   NA 142 04/02/2017 0000   NA 140 07/19/2016 1305   K 3.9 03/01/2023 1000   K 4.4 07/19/2016 1305   CL 104 03/01/2023 1000   CL 105 02/08/2016 1117   CL 107 03/25/2013 0828   CO2 27 03/01/2023 1000   CO2 26 07/19/2016 1305   BUN 29 (H) 03/01/2023 1000   BUN 21 04/02/2017 0000   BUN 22.8 07/19/2016 1305   CREATININE 1.33 (H) 03/01/2023 1000   CREATININE 1.1 07/19/2016 1305   GLU 105 04/02/2017 0000      Component Value Date/Time   CALCIUM 8.7 (L) 03/01/2023 1000   CALCIUM 9.8 07/19/2016 1305   ALKPHOS 65 03/01/2023 1000   ALKPHOS 76 07/19/2016 1305   AST 16 03/01/2023 1000   AST 18 07/19/2016 1305   ALT 6 03/01/2023 1000   ALT 14 07/19/2016 1305   BILITOT 0.4 03/01/2023 1000   BILITOT 0.70 07/19/2016 1305      Impression and Plan: Brian [name] is a 62 year old gentleman.  He has history of recurrent Hodgkin's disease. 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 He then had a recurrence after this.  He did undergo CAR[facility] therapy at [facility].  I believe he had this in April 2017.  He did well with this.  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  This was proven by biopsy.  He had radiation therapy for this.  He subsequently has had another recurrence.  This was in the right axilla.  This was biopsy-proven.  He did undergo radiation therapy for this.  This was completed in September/2023.  Now, we have another recurrence.    He currently is on chemotherapy with ANVB.  He is doing well on current treatment. He has 2 more planned cycles (containing 2 treatments). They ask when he will have his next scan which will be after he finished the next 2 complete cycles.   In terms of his anemia this has improved with the Monoferric infusion. Will watch his hemoglobin trends and give additional iron if/when needed.   Disposition: Treatment today RTC 3 weeks Day 1  Cycle 6 MD, labs(CBC w/, CMP, iron, ferritin, reticulocytes)      Brian Chestnut, PA-C 4/23/20249:44 AM

## 2023-03-19 NOTE — Patient Instructions (Signed)
[facility]  Discharge Instructions: Thank you for choosing [name] to provide your oncology and hematology care.   If you have a lab appointment with the Cancer Center, please go directly to the Cancer Center and check in at the registration area.  Wear comfortable clothing and clothing appropriate for easy access to any Portacath or PICC line.   We strive to give you quality time with your provider. You may need to reschedule your appointment if you arrive late (15 or more minutes).  Arriving late affects you and other patients whose appointments are after yours.  Also, if you miss three or more appointments without notifying the office, you may be dismissed from the clinic at the provider's discretion.      For prescription refill requests, have your pharmacy contact our office and allow 72 hours for refills to be completed.    Today you received the following chemotherapy and/or immunotherapy       To help prevent nausea and vomiting after your treatment, we encourage you to take your nausea medication as directed.  BELOW ARE SYMPTOMS THAT SHOULD BE REPORTED IMMEDIATELY: *FEVER GREATER THAN 100.4 F (38 C) OR HIGHER *CHILLS OR SWEATING *NAUSEA AND VOMITING THAT IS NOT CONTROLLED WITH YOUR NAUSEA MEDICATION *UNUSUAL SHORTNESS OF BREATH *UNUSUAL BRUISING OR BLEEDING *URINARY PROBLEMS (pain or burning when urinating, or frequent urination) *BOWEL PROBLEMS (unusual diarrhea, constipation, pain near the anus) TENDERNESS IN MOUTH AND THROAT WITH OR WITHOUT PRESENCE OF ULCERS (sore throat, sores in mouth, or a toothache) UNUSUAL RASH, SWELLING OR PAIN  UNUSUAL VAGINAL DISCHARGE OR ITCHING   Items with * indicate a potential emergency and should be followed up as soon as possible or go to the Emergency Department if any problems should occur.  Please show the CHEMOTHERAPY ALERT CARD or IMMUNOTHERAPY ALERT CARD at check-in to the  Emergency Department and triage nurse. Should you have questions after your visit or need to cancel or reschedule your appointment, please contact [facility] [place]  [phone] and follow the prompts.  Office hours are 8:00 a.m. to 4:30 p.m. Monday - Friday. Please note that voicemails left after 4:00 p.m. may not be returned until the following business day.  We are closed weekends and major holidays. You have access to a nurse at all times for urgent questions. Please call the main number to the clinic [phone] and follow the prompts.  For any non-urgent questions, you may also contact your provider using MyChart. We now offer e-Visits for anyone 14 and older to request care online for non-urgent symptoms. For details visit [email or web address].   Also download the MyChart app! Go to the app store, search "MyChart", open the app, select [place], and log in with your MyChart username and password.

## 2023-03-19 NOTE — Addendum Note (Signed)
Addended by: Corky Sing on: 03/19/2023 11:10 AM   Modules accepted: Orders

## 2023-03-20 ENCOUNTER — Encounter: Payer: Self-pay | Admitting: Hematology & Oncology

## 2023-03-20 LAB — CMP (CANCER CENTER ONLY)
AST: 9 U/L — ABNORMAL LOW (ref 15–41)
Albumin: 3.9 g/dL (ref 3.5–5.0)
Alkaline Phosphatase: 81 U/L (ref 38–126)
BUN: 41 mg/dL — ABNORMAL HIGH (ref 8–23)
Calcium: 9.2 mg/dL (ref 8.9–10.3)
Chloride: 105 mmol/L (ref 98–111)
GFR, Estimated: >60 mL/min (ref >60)
Total Bilirubin: 0.3 mg/dL (ref 0.3–1.2)

## 2023-03-26 ENCOUNTER — Encounter: Payer: Self-pay | Admitting: *Deleted

## 2023-03-28 ENCOUNTER — Telehealth: Payer: Self-pay | Admitting: Neurology

## 2023-03-28 NOTE — Telephone Encounter (Signed)
Called patient and he has been experiencing walking difficulties. He states that he has been having low blood pressure and is currently receiving chemo. Patient stated that he has been feeling this way for a few months. Patient stated that he is currently taking Carbidopa Levodopa two tablets two times a day and would like to know if it can be increased? Patient also wants to know if this is normal progression? Patient is aware that Dr. Arbutus Leas is out of office for the rest of the week.

## 2023-03-28 NOTE — Telephone Encounter (Signed)
Pt called in stating he has a question about his carbidopa-levodopa and wants to talk about his walking.

## 2023-03-29 NOTE — Telephone Encounter (Signed)
Patient taking  carbidopa/levodopa 25/100,  2 tablets at 7 AM, 2 tablets at 11 AM, 2 tablet at 3 PM, 2 tablet at 7 PM as recommended by Dr. Arbutus Leas Patient using walker and sometimes a wheelchair

## 2023-03-29 NOTE — Telephone Encounter (Signed)
Patient taking 2@7  AND 3@11  AND  3@ 3  He is also taking his 50/200 at bedtime and pramipexole as well spoke to patient regarding chemo and PD an there issues of the steroids and the medications involved with Chemo patient still going to PT but freezing and afraid of falling down. He feels he is really getting a double dose of bad diseases with the cancer and PD

## 2023-04-02 ENCOUNTER — Inpatient Hospital Stay (HOSPITAL_BASED_OUTPATIENT_CLINIC_OR_DEPARTMENT_OTHER): Payer: Commercial Managed Care - PPO | Admitting: Hematology & Oncology

## 2023-04-02 ENCOUNTER — Inpatient Hospital Stay: Payer: Commercial Managed Care - PPO

## 2023-04-02 ENCOUNTER — Other Ambulatory Visit: Payer: Self-pay

## 2023-04-02 ENCOUNTER — Encounter: Payer: Self-pay | Admitting: Hematology & Oncology

## 2023-04-02 ENCOUNTER — Inpatient Hospital Stay: Payer: Commercial Managed Care - PPO | Attending: Hematology & Oncology

## 2023-04-02 VITALS — BP 110/65 | HR 91 | Resp 17

## 2023-04-02 VITALS — BP 109/63 | HR 75 | Temp 98.3°F | Resp 17 | Ht 70.0 in | Wt 168.0 lb

## 2023-04-02 DIAGNOSIS — C8198 Hodgkin lymphoma, unspecified, lymph nodes of multiple sites: Secondary | ICD-10-CM | POA: Insufficient documentation

## 2023-04-02 DIAGNOSIS — K909 Intestinal malabsorption, unspecified: Secondary | ICD-10-CM | POA: Insufficient documentation

## 2023-04-02 DIAGNOSIS — Z923 Personal history of irradiation: Secondary | ICD-10-CM | POA: Insufficient documentation

## 2023-04-02 DIAGNOSIS — Z79899 Other long term (current) drug therapy: Secondary | ICD-10-CM | POA: Diagnosis not present

## 2023-04-02 DIAGNOSIS — Z9484 Stem cells transplant status: Secondary | ICD-10-CM | POA: Diagnosis not present

## 2023-04-02 DIAGNOSIS — C8195 Hodgkin lymphoma, unspecified, lymph nodes of inguinal region and lower limb: Secondary | ICD-10-CM

## 2023-04-02 DIAGNOSIS — D509 Iron deficiency anemia, unspecified: Secondary | ICD-10-CM | POA: Diagnosis not present

## 2023-04-02 DIAGNOSIS — Z5111 Encounter for antineoplastic chemotherapy: Secondary | ICD-10-CM | POA: Diagnosis present

## 2023-04-02 DIAGNOSIS — G20A1 Parkinson's disease without dyskinesia, without mention of fluctuations: Secondary | ICD-10-CM | POA: Diagnosis not present

## 2023-04-02 LAB — CMP (CANCER CENTER ONLY)
ALT: <5 U/L (ref 0–44)
AST: 10 U/L — ABNORMAL LOW (ref 15–41)
Albumin: 4.2 g/dL (ref 3.5–5.0)
Alkaline Phosphatase: 68 U/L (ref 38–126)
Anion gap: 5 (ref 5–15)
BUN: 31 mg/dL — ABNORMAL HIGH (ref 8–23)
CO2: 28 mmol/L (ref 22–32)
Calcium: 9.1 mg/dL (ref 8.9–10.3)
Chloride: 106 mmol/L (ref 98–111)
Creatinine: 1.19 mg/dL (ref 0.61–1.24)
GFR, Estimated: >60 mL/min (ref >60)
Glucose, Bld: 96 mg/dL (ref 70–99)
Potassium: 4.2 mmol/L (ref 3.5–5.1)
Sodium: 139 mmol/L (ref 135–145)
Total Bilirubin: 0.4 mg/dL (ref 0.3–1.2)
Total Protein: 6.3 g/dL — ABNORMAL LOW (ref 6.5–8.1)

## 2023-04-02 LAB — IRON AND IRON BINDING CAPACITY (CC-WL,HP ONLY)
Iron: 62 ug/dL (ref 45–182)
Saturation Ratios: 19 % (ref 17.9–39.5)
TIBC: 332 ug/dL (ref 250–450)
UIBC: 270 ug/dL (ref 117–376)

## 2023-04-02 LAB — CBC WITH DIFFERENTIAL (CANCER CENTER ONLY)
Abs Immature Granulocytes: 0.01 10*3/uL (ref 0.00–0.07)
Basophils Absolute: 0 10*3/uL (ref 0.0–0.1)
Basophils Relative: 3 %
Eosinophils Absolute: 0.1 10*3/uL (ref 0.0–0.5)
Eosinophils Relative: 3 %
HCT: 29.4 % — ABNORMAL LOW (ref 39.0–52.0)
Hemoglobin: 9.2 g/dL — ABNORMAL LOW (ref 13.0–17.0)
Immature Granulocytes: 1 %
Lymphocytes Relative: 29 %
Lymphs Abs: 0.4 10*3/uL — ABNORMAL LOW (ref 0.7–4.0)
MCH: 30.8 pg (ref 26.0–34.0)
MCHC: 31.3 g/dL (ref 30.0–36.0)
MCV: 98.3 fL (ref 80.0–100.0)
Monocytes Absolute: 0.4 10*3/uL (ref 0.1–1.0)
Monocytes Relative: 27 %
Neutro Abs: 0.5 10*3/uL — ABNORMAL LOW (ref 1.7–7.7)
Neutrophils Relative %: 37 %
Platelet Count: 206 10*3/uL (ref 150–400)
RBC: 2.99 MIL/uL — ABNORMAL LOW (ref 4.22–5.81)
RDW: 17.1 % — ABNORMAL HIGH (ref 11.5–15.5)
Smear Review: NORMAL
WBC Count: 1.5 10*3/uL — ABNORMAL LOW (ref 4.0–10.5)
nRBC: 0 % (ref 0.0–0.2)

## 2023-04-02 LAB — LACTATE DEHYDROGENASE: LDH: 189 U/L (ref 98–192)

## 2023-04-02 LAB — FERRITIN: Ferritin: 406 ng/mL — ABNORMAL HIGH (ref 24–336)

## 2023-04-02 MED ORDER — DOXORUBICIN HCL CHEMO IV INJECTION 2 MG/ML
25.0000 mg/m2 | Freq: Once | INTRAVENOUS | Status: AC
  Administered 2023-04-02: 50 mg via INTRAVENOUS
  Filled 2023-04-02: qty 25

## 2023-04-02 MED ORDER — SODIUM CHLORIDE 0.9 % IV SOLN
240.0000 mg | Freq: Once | INTRAVENOUS | Status: AC
  Administered 2023-04-02: 240 mg via INTRAVENOUS
  Filled 2023-04-02: qty 24

## 2023-04-02 MED ORDER — SODIUM CHLORIDE 0.9 % IV SOLN: Freq: Once | INTRAVENOUS | Status: AC

## 2023-04-02 MED ORDER — SODIUM CHLORIDE 0.9 % IV SOLN
10.0000 mg | Freq: Once | INTRAVENOUS | Status: AC
  Administered 2023-04-02: 10 mg via INTRAVENOUS
  Filled 2023-04-02: qty 10

## 2023-04-02 MED ORDER — SODIUM CHLORIDE 0.9 % IV SOLN
150.0000 mg | Freq: Once | INTRAVENOUS | Status: AC
  Administered 2023-04-02: 150 mg via INTRAVENOUS
  Filled 2023-04-02: qty 150

## 2023-04-02 MED ORDER — HEPARIN SOD (PORK) LOCK FLUSH 100 UNIT/ML IV SOLN
500.0000 [IU] | Freq: Once | INTRAVENOUS | Status: AC | PRN
  Administered 2023-04-02: 500 [IU]

## 2023-04-02 MED ORDER — SODIUM CHLORIDE 0.9% FLUSH
10.0000 mL | INTRAVENOUS | Status: DC | PRN
  Administered 2023-04-02: 10 mL

## 2023-04-02 MED ORDER — VINBLASTINE SULFATE CHEMO INJECTION 1 MG/ML
6.0000 mg/m2 | Freq: Once | INTRAVENOUS | Status: AC
  Administered 2023-04-02: 11.9 mg via INTRAVENOUS
  Filled 2023-04-02: qty 11.9

## 2023-04-02 MED ORDER — SODIUM CHLORIDE 0.9 % IV SOLN
375.0000 mg/m2 | Freq: Once | INTRAVENOUS | Status: AC
  Administered 2023-04-02: 800 mg via INTRAVENOUS
  Filled 2023-04-02: qty 80

## 2023-04-02 MED ORDER — PALONOSETRON HCL INJECTION 0.25 MG/5ML
0.2500 mg | Freq: Once | INTRAVENOUS | Status: AC
  Administered 2023-04-02: 0.25 mg via INTRAVENOUS
  Filled 2023-04-02: qty 5

## 2023-04-02 MED ORDER — LACTATED RINGERS IV SOLN
250.0000 mg/m2 | Freq: Once | INTRAVENOUS | Status: AC
  Administered 2023-04-02: 500 mg via INTRAVENOUS
  Filled 2023-04-02: qty 50

## 2023-04-02 NOTE — Patient Instructions (Signed)
[facility]  Discharge Instructions: Thank you for choosing [facility] to provide your oncology and hematology care.   If you have a lab appointment with the Cancer Center, please go directly to the Cancer Center and check in at the registration area.  Wear comfortable clothing and clothing appropriate for easy access to any Portacath or PICC line.   We strive to give you quality time with your provider. You may need to reschedule your appointment if you arrive late (15 or more minutes).  Arriving late affects you and other patients whose appointments are after yours.  Also, if you miss three or more appointments without notifying the office, you may be dismissed from the clinic at the provider's discretion.      For prescription refill requests, have your pharmacy contact our office and allow 72 hours for refills to be completed.    Today you received the following chemotherapy and/or immunotherapy agents:  Adriamycin, Opidivo, Velban and DTIC      To help prevent nausea and vomiting after your treatment, we encourage you to take your nausea medication as directed.  BELOW ARE SYMPTOMS THAT SHOULD BE REPORTED IMMEDIATELY: *FEVER GREATER THAN 100.4 F (38 C) OR HIGHER *CHILLS OR SWEATING *NAUSEA AND VOMITING THAT IS NOT CONTROLLED WITH YOUR NAUSEA MEDICATION *UNUSUAL SHORTNESS OF BREATH *UNUSUAL BRUISING OR BLEEDING *URINARY PROBLEMS (pain or burning when urinating, or frequent urination) *BOWEL PROBLEMS (unusual diarrhea, constipation, pain near the anus) TENDERNESS IN MOUTH AND THROAT WITH OR WITHOUT PRESENCE OF ULCERS (sore throat, sores in mouth, or a toothache) UNUSUAL RASH, SWELLING OR PAIN  UNUSUAL VAGINAL DISCHARGE OR ITCHING   Items with * indicate a potential emergency and should be followed up as soon as possible or go to the Emergency Department if any problems should occur.  Please show the CHEMOTHERAPY ALERT CARD or  IMMUNOTHERAPY ALERT CARD at check-in to the Emergency Department and triage nurse. Should you have questions after your visit or need to cancel or reschedule your appointment, please contact [facility] [place]  [phone] and follow the prompts.  Office hours are 8:00 a.m. to 4:30 p.m. Monday - Friday. Please note that voicemails left after 4:00 p.m. may not be returned until the following business day.  We are closed weekends and major holidays. You have access to a nurse at all times for urgent questions. Please call the main number to the clinic [phone] and follow the prompts.  For any non-urgent questions, you may also contact your provider using MyChart. We now offer e-Visits for anyone 13 and older to request care online for non-urgent symptoms. For details visit [email or web address].   Also download the MyChart app! Go to the app store, search "MyChart", open the app, select [facility], and log in with your MyChart username and password.

## 2023-04-02 NOTE — Progress Notes (Signed)
Ok to treat with WBC-1.5 and ANC-0.5 per order of Dr. Myna Hidalgo.

## 2023-04-02 NOTE — Progress Notes (Signed)
Hematology and Oncology Follow Up Visit  Brian [name] [id number] 04/11/1961 62 y.o. 04/02/2023   Principle Diagnosis:  Recurrent Hodgkin's Disease -  S/p CAR-T therapy -- relapsed TIA-resolved Iron deficiency anemia-iron malabsorption  Current Therapy:  ANVD -- s/p cycle #5 -- start on 10/25/2022 Nivolumab q 3 month -- s/p cycle #18 -- on hold XRT -- Involved field --completed on 08/06/2022  Neulasta 6 mg IM post each chemotherapy   IV iron-Monoferric-given on 03/04/2023     Interim History:  [name] is in for follow-up.  So far, I think is doing pretty well with treatment.  He still has a Parkinson's.  Everything seems to be holding relatively steady with the Parkinson's although he may have a little more issue with his feet.  He has had no problems with fever.  He has had no change in bowel or bladder habits.  He has had no cough or shortness of breath.  His appetite has been quite good.  He has had no bleeding.  His last iron studies look better.  Got IV iron back in early April.  Back on 03/19/2023, his ferritin was 516 with an iron saturation of 22%.  He has had no headache.  He has had no mouth sores.  Overall, I would say that his performance status is probably ECOG 1.        Medications:  Current Outpatient Medications:    AMBULATORY NON FORMULARY MEDICATION, Please dispense one abdominal compression binder, Disp: 1 Device, Rfl: 0   carbidopa-levodopa (SINEMET CR) 50-200 MG tablet, TAKE 1 TABLET BY MOUTH AT BEDTIME, Disp: 90 tablet, Rfl: 0   carbidopa-levodopa (SINEMET IR) 25-100 MG tablet, TAKE 2 TABLETS AT 7AM, 11AM, 3 PM and 7PM (Patient taking differently: 3 (three) times daily. 02/08/2023 TAKE 2 TABLETS AT 7AM, 11AM, 3 PM.), Disp: 720 tablet, Rfl: 0   cholecalciferol (VITAMIN D3) 25 MCG (1000 UNIT) tablet, Take 1,000 Units by mouth daily., Disp: , Rfl:    dexamethasone (DECADRON) 4 MG tablet, Take 2 tablets (8 mg) by mouth daily for 3 days after chemotherapy. Take with  food., Disp: 90 tablet, Rfl: 3   diphenhydramine-acetaminophen (TYLENOL PM) 25-500 MG TABS tablet, Take 1 tablet by mouth at bedtime as needed., Disp: , Rfl:    pramipexole (MIRAPEX) 0.5 MG tablet, TAKE 1 TABLET(0.5 MG) BY MOUTH THREE TIMES DAILY, Disp: 270 tablet, Rfl: 0 No current facility-administered medications for this visit.  Facility-Administered Medications Ordered in Other Visits:    sodium chloride flush (NS) 0.9 % injection 10 mL, 10 mL, Intracatheter, PRN, [name], MD, 10 mL at 03/19/23 1338  Allergies: No Known Allergies  Past Medical History, Surgical history, Social history, and Family History were reviewed and updated.  Review of Systems: Review of Systems  Neurological:  Positive for tremors.  All other systems reviewed and are negative.    Physical Exam:  height is 5\' 10"  (1.778 m) and weight is 168 lb (76.2 kg). His oral temperature is 98.3 F (36.8 C). His blood pressure is 109/63 and his pulse is 75. His respiration is 17 and oxygen saturation is 96%.   Physical Exam Vitals reviewed.  HENT:     Head: Normocephalic and atraumatic.  Eyes:     Pupils: Pupils are equal, round, and reactive to light.  Cardiovascular:     Rate and Rhythm: Normal rate and regular rhythm.     Heart sounds: Normal heart sounds.  Pulmonary:     Effort: Pulmonary effort  is normal.     Breath sounds: Normal breath sounds.  Abdominal:     General: Bowel sounds are normal.     Palpations: Abdomen is soft.  Musculoskeletal:        General: No tenderness or deformity. Normal range of motion.     Cervical back: Normal range of motion.  Lymphadenopathy:     Cervical: No cervical adenopathy.  Skin:    General: Skin is warm and dry.     Findings: No erythema or rash.  Neurological:     Mental Status: He is alert and oriented to person, place, and time.  Psychiatric:        Behavior: Behavior normal.        Thought Content: Thought content normal.        Judgment: Judgment  normal.    Lab Results  Component Value Date   WBC 1.5 (L) 04/02/2023   HGB 9.2 (L) 04/02/2023   HCT 29.4 (L) 04/02/2023   MCV 98.3 04/02/2023   PLT 206 04/02/2023     Chemistry      Component Value Date/Time   NA 139 04/02/2023 0845   NA 142 04/02/2017 0000   NA 140 07/19/2016 1305   K 4.2 04/02/2023 0845   K 4.4 07/19/2016 1305   CL 106 04/02/2023 0845   CL 105 02/08/2016 1117   CL 107 03/25/2013 0828   CO2 28 04/02/2023 0845   CO2 26 07/19/2016 1305   BUN 31 (H) 04/02/2023 0845   BUN 21 04/02/2017 0000   BUN 22.8 07/19/2016 1305   CREATININE 1.19 04/02/2023 0845   CREATININE 1.1 07/19/2016 1305   GLU 105 04/02/2017 0000      Component Value Date/Time   CALCIUM 9.1 04/02/2023 0845   CALCIUM 9.8 07/19/2016 1305   ALKPHOS 68 04/02/2023 0845   ALKPHOS 76 07/19/2016 1305   AST 10 (L) 04/02/2023 0845   AST 18 07/19/2016 1305   ALT <5 04/02/2023 0845   ALT 14 07/19/2016 1305   BILITOT 0.4 04/02/2023 0845   BILITOT 0.70 07/19/2016 1305      Impression and Plan: Brian [name] is a 62 year old gentleman.  He has history of recurrent Hodgkin's disease. 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 He then had a recurrence after this.  He did undergo CAR-T therapy at [facility].  I believe he had this in April 2017.  He did well with this.  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  This was proven by biopsy.  He had radiation therapy for this.  He subsequently has had another recurrence.  This was in the right axilla.  This was biopsy-proven.  He did undergo radiation therapy for this.  This was completed in September/2023.  Now, we have another recurrence.    He currently is on chemotherapy with ANVB.  I think he is doing well with this.  His last PET scan showed that he was in remission.  This to be his sixth and final cycle of chemotherapy.  After this, we can watch him more we can certainly consider utilizing immunotherapy.  I think we may  have to consider immunotherapy with him.  I probably would get a PET scan sometime about a month or so after he completes his last cycle.  He will need to have Neulasta after this dose of chemotherapy.    We will plan to see him back after he has his PET scan.  I will  also get a echocardiogram on him.      [name], MD 5/7/20249:21 AM

## 2023-04-03 ENCOUNTER — Telehealth: Payer: Self-pay | Admitting: *Deleted

## 2023-04-03 NOTE — Telephone Encounter (Signed)
-----   Message from [name], MD sent at 04/02/2023  8:36 PM EDT ----- Call - the iron level is ok.   It will help if you take oral iron supplements -- please take extra Vitamin C also!!!   Cindee Lame

## 2023-04-04 ENCOUNTER — Inpatient Hospital Stay: Payer: Commercial Managed Care - PPO

## 2023-04-04 VITALS — BP 125/85 | HR 89 | Temp 98.2°F | Resp 17

## 2023-04-04 DIAGNOSIS — C8195 Hodgkin lymphoma, unspecified, lymph nodes of inguinal region and lower limb: Secondary | ICD-10-CM

## 2023-04-04 DIAGNOSIS — Z5111 Encounter for antineoplastic chemotherapy: Secondary | ICD-10-CM | POA: Diagnosis not present

## 2023-04-04 MED ORDER — PEGFILGRASTIM-CBQV 6 MG/0.6ML ~~LOC~~ SOSY
6.0000 mg | PREFILLED_SYRINGE | Freq: Once | SUBCUTANEOUS | Status: AC
  Administered 2023-04-04: 6 mg via SUBCUTANEOUS
  Filled 2023-04-04: qty 0.6

## 2023-04-04 NOTE — Patient Instructions (Signed)

## 2023-04-08 ENCOUNTER — Ambulatory Visit: Payer: Commercial Managed Care - PPO | Attending: Neurology | Admitting: Physical Therapy

## 2023-04-08 ENCOUNTER — Encounter: Payer: Self-pay | Admitting: Physical Therapy

## 2023-04-08 DIAGNOSIS — M6281 Muscle weakness (generalized): Secondary | ICD-10-CM | POA: Insufficient documentation

## 2023-04-08 DIAGNOSIS — R2689 Other abnormalities of gait and mobility: Secondary | ICD-10-CM | POA: Insufficient documentation

## 2023-04-08 DIAGNOSIS — R2681 Unsteadiness on feet: Secondary | ICD-10-CM

## 2023-04-08 NOTE — Therapy (Signed)
OUTPATIENT PHYSICAL THERAPY NEURO TREATMENT NOTE   Patient Name: Brian [name] MRN: [id number] DOB:[birth year], 62 y.o., male Today's Date: 04/08/2023   PCP: Abbe Amsterdam, MD REFERRING PROVIDER: Kerin Salen, DO  END OF SESSION:  PT End of Session - 04/08/23 1102     Visit Number 4    Number of Visits 5    Date for PT Re-Evaluation 05/03/23    Authorization Type [employer] (from [facility])    PT Start Time 1104    PT Stop Time 1148    PT Time Calculation (min) 44 min    Activity Tolerance Patient limited by fatigue    Behavior During Therapy [facility] for tasks assessed/performed;Flat affect             Past Medical History:  Diagnosis Date   Anxiety    Dysrhythmia    past hx pvc   Goals of care, counseling/discussion 09/25/2018   H/O autologous stem cell transplant (HCC)    may 2014  at [facility]   History of Bell's palsy    2009  RIGHT SIDE--  HAS 80% FUNCTION / PT STATES A LITTLE ASYMETRICAL AND EFFECTS MOUTH   History of radiation therapy 10/18/17-11/05/17   sprine T1 26 Gy in 13 fractions, spine boost 10 Gy in 5 fractions   History of radiation therapy    Right hip, Rt Sclav- 07/23/22-08/07/11- Dr. 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   Mass of right chest wall 09/18/2018   Mass of right inguinal region    Neuromuscular disorder (HCC)    bilateral feet neuropathy   PVC (premature ventricular contraction)    "benign"   TIA (transient ischemic attack) 02/2015   "probable TIA"   Wears contact lenses    Past Surgical History:  Procedure Laterality Date   AXILLARY LYMPH NODE BIOPSY Left 02/02/2013   Procedure: NEEDLE LOCALIZED AXILLARY LYMPH NODE BIOPSY;  Surgeon: [name], MD;  Location: [facility];  Service: General;  Laterality: Left;   BACK  SURGERY  03/2021   BONE MARROW BIOPSY  08/26/2012   COLONOSCOPY  2021   CYST REMOVAL NECK Left 11/26/1978   IR IMAGING GUIDED PORT INSERTION  10/23/2022   LEFT INGUINAL LYMPH NODE BX  09/09/2012   LYMPH NODE BIOPSY N/A 07/28/2015   Procedure: LYMPH NODE BIOPSY;  Surgeon: Loreli Slot, MD;  Location: [facility] OR;  Service: Thoracic;  Laterality: N/A;   LYMPH NODE BIOPSY Right 06/29/2022   Procedure: RIGHT AXILLARY NODE BIOPSY;  Surgeon: Loreli Slot, MD;  Location[facility] OR;  Service: Vascular;  Laterality: Right;   MASS EXCISION Right 09/19/2018   Procedure: EXCISION OF RIGHT CHEST WALL MASS ERAS PATHWAY;  Surgeon: Claud Kelp, MD;  Location: [facility];  Service: General;  Laterality: Right;   NODE DISSECTION Right 07/28/2015   Procedure: NODE DISSECTION;  Surgeon: Loreli Slot, MD;  Location[facility] OR;  Service: Thoracic;  Laterality: Right;   PLEURA BIOPSY Left 05/31/2014   REMOVAL RIGHT INGUINAL LYMPH NODES  08/16/2011   SCROTAL EXPLORATION Right 01/04/2014   Procedure: SCROTUM EXPLORATION   INGUINAL , EXCISION OF CYSTIC MASS OF RIGHT SPERMATIC CORD, WITH FROZEN SECTION;  Surgeon: Marcine Matar, MD;  Location: [facility]  Service: Urology;  Laterality: Right;   TRANSTHORACIC ECHOCARDIOGRAM  03/18/2013   MILD LVH/  EF 55-60%   VIDEO ASSISTED THORACOSCOPY Left 05/31/2014   Procedure: LEFT VIDEO ASSISTED THORACOSCOPY, PLEURAL BIOPSY;  Surgeon: Loreli Slot, MD;  Location: [facility] OR;  Service: Thoracic;  Laterality: Left;   VIDEO ASSISTED THORACOSCOPY Right 07/28/2015   Procedure: RIGHT VIDEO ASSISTED THORACOSCOPY;  Surgeon: Loreli Slot, MD;  Location: [facility] OR;  Service: Thoracic;  Laterality: Right;   VIDEO ASSISTED THORACOSCOPY (VATS)/ LYMPH NODE SAMPLING Right 07/28/2015   VIDEO ASSISTED THORACOSCOPY (VATS)/WEDGE RESECTION  05/31/2014   VIDEO BRONCHOSCOPY WITH ENDOBRONCHIAL ULTRASOUND  07/28/2015   VIDEO BRONCHOSCOPY WITH  ENDOBRONCHIAL ULTRASOUND N/A 07/28/2015   Procedure: VIDEO BRONCHOSCOPY WITH ENDOBRONCHIAL ULTRASOUND;  Surgeon: Loreli Slot, MD;  Location: [facility] OR;  Service: Thoracic;  Laterality: N/A;   Patient Active Problem List   Diagnosis Date Noted   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   Primary hypertension 09/12/2022   Closed fracture of right proximal humerus 10/03/2021   Traumatic complete tear of right rotator cuff 10/03/2021   Lumbar radiculopathy 10/03/2021   Goals of care, counseling/discussion 09/25/2018   Mass of right chest wall 09/18/2018   PD (Parkinson's disease) 11/27/2016   Lymphadenopathy 07/28/2015   Mediastinal adenopathy 07/14/2015   Aphasia 03/04/2015   Slurred speech 03/04/2015   Pleural mass 05/31/2014   Abdominal pain, epigastric 02/02/2014   Weight loss 08/12/2012   H/O Bell's palsy 08/12/2012   Hodgkin's disease 08/09/2011    ONSET DATE: 01/02/2023 (MD referral)  REFERRING DIAG: G20.B2 (ICD-10-CM) - Parkinson's disease with dyskinesia and fluctuating manifestations   THERAPY DIAG:  Unsteadiness on feet  Other abnormalities of gait and mobility  Muscle weakness (generalized)  Rationale for Evaluation and Treatment: Rehabilitation  SUBJECTIVE:                                                                                                                                                                                             SUBJECTIVE STATEMENT: Had chemo last week, then the booster for the blood cell growth.  Was super tired over the weekend and didn't leave the house.  Having a hard time this weekend.  Been trying to get out of the car for 10 minutes.  Having a hard time with freezing.  One more round of chemo.  No falls.  Pt accompanied by: self  PERTINENT HISTORY: previous d/c from PT 11/08/22, due to ongoing treatment for Hodgkin's disease (recurrent)-see PMH above  PAIN:  Are you having pain? Yes: NPRS scale: 4-5/10 Pain location: back   Pain description: consistent Aggravating factors: just there Relieving factors: stretching *PT  will monitor, but will not address as a goal at this time, as pain is longstanding and he manages with daily stretches.  PRECAUTIONS: Fall and Other: currently on chemo  WEIGHT BEARING RESTRICTIONS: No  FALLS: Has patient fallen in last 6 months? Yes. Number of falls 2-3  LIVING ENVIRONMENT: Lives with: lives with their family and significant other Lives in: House/apartment Stairs: 2 Has following equipment at home: Single point cane, Environmental consultant - 2 wheeled, and Wheelchair (manual)  PLOF: Independent and recently retired; goes to [employer] -uses stationary bike  PATIENT GOALS: Looking for ways for back to not be painful  OBJECTIVE:    TODAY'S TREATMENT: 04/08/2023 Activity Comments  Sit<>stand 3 x 5 reps from mat Cues for wider BOS  Seated step out/out, in/in x 5 reps Cues for foot clearance  Worked on strategies to lessen freezing episodes -step to widen BOS with lateral or stagger stance weightshifting  Cues and min guard, pt uses cane today  Vitals 117/77, HR 106   Hamstring, low back stretch: -supine assisted with PT, then pt performing SKTC -seated knee to chest stretch -seated marching, seated out/in stepping -standing gastroc stretch, lumbar stretch, stagger stance forward/back rocking   Short distance gait and turns: -count steps to get to target in as few steps as possible, marching in place   Standing with cane: -forward step and weightshift 2 x 5 reps Cues for foot clearance  Gait from clinic room to lobby to bench outside, with cane, min guard Several freezing episodes, cues for posture and taking as few steps as possible          PATIENT EDUCATION: Education details: strategies for movement, to lessen freezing episodes, depending on position he's in (sitting or standing, gait) Person educated: Patient Education method: Explanation, Demonstration, and  Handouts Education comprehension: verbalized understanding, returned demonstration, and needs further education  Access Code: [facility] URL: [email or web address] Date: 02/19/2023 Prepared by: [facility] - Outpatient  Rehab - [facility]  Exercises - Seated Hamstring stretch  - 1-2 x daily - 7 x weekly - 1 sets - 3 reps - 30 sec hold - Standing Gastroc Stretch at Counter  - 1-2 x daily - 7 x weekly - 1 sets - 3 reps - 30 sec hold - Sit to Stand  - 1 x daily - 7 x weekly - 2 sets - 10 reps - Mini Squat  - 1 x daily - 5 x weekly - 1-2 sets - 10 reps - Side Stepping with Counter Support  - 1 x daily - 5 x weekly - 1-2 sets - 10 reps - Alternating Step forward-Balance Reaction  - 1 x daily - 5 x weekly - 1-2 sets - 10 reps   ---------------------------------------------------------------- Objective measures taken at initial evaluation:  DIAGNOSTIC FINDINGS: NA for PD at this time  COGNITION: Overall cognitive status: Within functional limits for tasks assessed   SENSATION: Light touch: intact to light touch, but pt does have hx of neuropathy Reports neuropathy in bilat feet  POSTURE: rounded shoulders, forward head, and dyskinesias  LOWER EXTREMITY ROM:   tightness in L hamstrings  LOWER EXTREMITY MMT:    MMT Right Eval Left Eval  Hip flexion 4+ 4+  Hip extension    Hip abduction    Hip adduction    Hip internal rotation    Hip external rotation    Knee flexion 4 4  Knee extension 4 4  Ankle dorsiflexion 3+ 3+  Ankle plantarflexion  Ankle inversion    Ankle eversion    (Blank rows = not tested)   TRANSFERS: Assistive device utilized: None  Sit to stand: Modified independence Stand to sit: Modified independence  GAIT: Gait pattern: step through pattern, decreased step length- Right, decreased step length- Left, decreased ankle dorsiflexion- Right, decreased ankle dorsiflexion- Left, festinating, narrow BOS, poor foot clearance- Right, and poor  foot clearance- Left Distance walked: 50 ft x 2 Assistive device utilized:  single walking pole and None Level of assistance: Modified independence Comments: Cues to use walking pole for sequence-longer stride, improved heelstrike, and for posture reset if needed  FUNCTIONAL TESTS:  5 times sit to stand: 10.97 sec Timed up and go (TUG): 11.84 sec 10 meter walk test: 9.22 sec= 3.56 ft/sec;  with cognitive task:  13.38 sec (2.45 ft/sec) TUG cognitive:  23.85 sec 65M back:  9.16 sec (1.09 ft/sec) Push and release:  Posterior:   Multiple small steps to recover  Anterior: Multiple small festinating steps to recover  Lateral R and L:  Multiple small steps to recover   TODAY'S TREATMENT:                                                                                                                              DATE: 02/05/2023    PATIENT EDUCATION: Education details: PT eval results, POC; initial HEP-stretches Person educated: Patient Education method: Explanation, Demonstration, Verbal cues, and Handouts Education comprehension: verbalized understanding, returned demonstration, and needs further education  HOME EXERCISE PROGRAM: Access Code: [facility] URL: [email or web address] Date: 02/05/2023 Prepared by: [facility] - Outpatient  Rehab - [facility]  Exercises - Seated Hamstring stretch  - 1-2 x daily - 7 x weekly - 1 sets - 3 reps - 30 sec hold - Standing Gastroc Stretch at Counter  - 1-2 x daily - 7 x weekly - 1 sets - 3 reps - 30 sec hold  GOALS: Goals reviewed with patient? Yes  SHORT TERM GOALS: Target date: 03/08/2023  Pt will be independent with HEP for improved flexibility, balance, gait. Baseline: performing HEP and going to gym for use of bike and machines Goal status: GOAL MET, 03/18/2023  2.  Pt will verbalize/demo tips to reduce freezing episodes with use of walking pole. Baseline: stop, wide BOS, longer strides; sometimes it happens before I can  stop it; tries to avoid crowds. Goal status: PARTIALLY MET  LONG TERM GOALS: Target date: 05/03/2023  Pt will be independent with progression of HEP for improved balance, flexibility, gait. Baseline:  Goal status: IN PROGRESS  2.  Pt will improve TUG/TUG cognitive score to less than or equal to 10% difference for decreased fall risk. Baseline: 11.84 sec/cog 23.85 sec Goal status: IN PROGRESS  3.  Pt will demonstrate balance recovery in posterior and anterior push and release test, in 2 or less steps. Baseline: multiple small steps Goal status: IN PROGRESS  4.  Pt will improve gait velocity with dual  cognitive task to at least 3 ft/sec Baseline: 2.45 ft/sec Goal status: IN PROGRESS  ASSESSMENT:  CLINICAL IMPRESSION: Pt arrives today, stating (and demo) that he is having a hard day with movements, with increased festinating/freezing episodes.  He endorses that he is increasingly aware of fear of falling, fear of movement, fear of freezing when out in crowds.  Worked on strategies in varied positions (sitting, supine, standing, gait) to work on goal of movements to lessen stiffness and increase ease of motion.  Pt continues to verbalize frustration at these times when he cannot move well.  Encouraged him to continue to communicate with neurologist as needed in addition to using strategies and exercises worked on in therapy.    He is to have last round of chemo in 2 weeks and then will be out of town for several weeks.  He would like to come in to reassess goals (will likely be out of current POC dates, and will need reassessment/recert) to determine further course of therapy.  OBJECTIVE IMPAIRMENTS: Abnormal gait, decreased balance, decreased knowledge of use of DME, decreased mobility, difficulty walking, decreased strength, impaired flexibility, and postural dysfunction.   ACTIVITY LIMITATIONS: bending, sitting, standing, squatting, transfers, and locomotion level  PARTICIPATION  LIMITATIONS: driving, community activity, and exercise  PERSONAL FACTORS: 3+ comorbidities: see above; currently undergoing chemo for Hodgkin's disease  are also affecting patient's functional outcome.   REHAB POTENTIAL: Good  CLINICAL DECISION MAKING: Evolving/moderate complexity  EVALUATION COMPLEXITY: Moderate  PLAN:  PT FREQUENCY: 1-2x/month  PT DURATION: 12 weeks  PLANNED INTERVENTIONS: Therapeutic exercises, Therapeutic activity, Neuromuscular re-education, Balance training, Gait training, Patient/Family education, Self Care, and DME instructions  PLAN FOR NEXT SESSION: Will need to assess LTGs and determine POC.  Reinforce movement strategies, optimal fitness routine.   Provide information on optimal exercise routine and tips to reduce freezing.  Gean Maidens., PT 04/08/2023, 11:57 AM  [facility] at [facility] [address] [place], [address] [place], [place] [address] Phone # [phone] Fax # [phone]

## 2023-04-09 ENCOUNTER — Telehealth: Payer: Self-pay | Admitting: Anesthesiology

## 2023-04-09 NOTE — Telephone Encounter (Signed)
Pt left message with AN stating he would like to speak to [facility], no reason given.

## 2023-04-09 NOTE — Telephone Encounter (Signed)
Last Chemo is the 28th  Last scan late June  Patient had a chemo treatment last week patient feels he is tired and fatigued he had a nulasta shot on Thursday. I gave patient same advice

## 2023-04-10 ENCOUNTER — Other Ambulatory Visit (HOSPITAL_COMMUNITY): Payer: Commercial Managed Care - PPO

## 2023-04-11 ENCOUNTER — Inpatient Hospital Stay: Payer: Commercial Managed Care - PPO

## 2023-04-11 ENCOUNTER — Inpatient Hospital Stay (HOSPITAL_BASED_OUTPATIENT_CLINIC_OR_DEPARTMENT_OTHER): Payer: Commercial Managed Care - PPO | Admitting: Hematology & Oncology

## 2023-04-11 ENCOUNTER — Encounter: Payer: Self-pay | Admitting: Hematology & Oncology

## 2023-04-11 ENCOUNTER — Other Ambulatory Visit: Payer: Self-pay

## 2023-04-11 ENCOUNTER — Other Ambulatory Visit (HOSPITAL_BASED_OUTPATIENT_CLINIC_OR_DEPARTMENT_OTHER): Payer: Self-pay

## 2023-04-11 ENCOUNTER — Telehealth: Payer: Self-pay | Admitting: Neurology

## 2023-04-11 ENCOUNTER — Telehealth: Payer: Self-pay | Admitting: *Deleted

## 2023-04-11 ENCOUNTER — Other Ambulatory Visit: Payer: Self-pay | Admitting: *Deleted

## 2023-04-11 VITALS — BP 115/69 | HR 93 | Resp 16

## 2023-04-11 VITALS — BP 105/67 | HR 111 | Temp 98.2°F | Resp 17

## 2023-04-11 DIAGNOSIS — C8195 Hodgkin lymphoma, unspecified, lymph nodes of inguinal region and lower limb: Secondary | ICD-10-CM

## 2023-04-11 DIAGNOSIS — R634 Abnormal weight loss: Secondary | ICD-10-CM

## 2023-04-11 DIAGNOSIS — K909 Intestinal malabsorption, unspecified: Secondary | ICD-10-CM

## 2023-04-11 DIAGNOSIS — R21 Rash and other nonspecific skin eruption: Secondary | ICD-10-CM

## 2023-04-11 DIAGNOSIS — Z95828 Presence of other vascular implants and grafts: Secondary | ICD-10-CM

## 2023-04-11 DIAGNOSIS — Z5111 Encounter for antineoplastic chemotherapy: Secondary | ICD-10-CM | POA: Diagnosis not present

## 2023-04-11 LAB — CBC WITH DIFFERENTIAL (CANCER CENTER ONLY)
Abs Immature Granulocytes: 2.49 10*3/uL — ABNORMAL HIGH (ref 0.00–0.07)
Basophils Absolute: 0.1 10*3/uL (ref 0.0–0.1)
Basophils Relative: 1 %
Eosinophils Absolute: 0 10*3/uL (ref 0.0–0.5)
Eosinophils Relative: 0 %
HCT: 29.3 % — ABNORMAL LOW (ref 39.0–52.0)
Hemoglobin: 9.2 g/dL — ABNORMAL LOW (ref 13.0–17.0)
Immature Granulocytes: 16 %
Lymphocytes Relative: 7 %
Lymphs Abs: 1.1 10*3/uL (ref 0.7–4.0)
MCH: 31 pg (ref 26.0–34.0)
MCHC: 31.4 g/dL (ref 30.0–36.0)
MCV: 98.7 fL (ref 80.0–100.0)
Monocytes Absolute: 3.4 10*3/uL — ABNORMAL HIGH (ref 0.1–1.0)
Monocytes Relative: 22 %
Neutro Abs: 8.5 10*3/uL — ABNORMAL HIGH (ref 1.7–7.7)
Neutrophils Relative %: 54 %
Platelet Count: 124 10*3/uL — ABNORMAL LOW (ref 150–400)
RBC: 2.97 MIL/uL — ABNORMAL LOW (ref 4.22–5.81)
RDW: 17.2 % — ABNORMAL HIGH (ref 11.5–15.5)
Smear Review: NORMAL
WBC Count: 15.6 10*3/uL — ABNORMAL HIGH (ref 4.0–10.5)
nRBC: 1.5 % — ABNORMAL HIGH (ref 0.0–0.2)

## 2023-04-11 LAB — CMP (CANCER CENTER ONLY)
ALT: <5 U/L (ref 0–44)
AST: 12 U/L — ABNORMAL LOW (ref 15–41)
Albumin: 4.4 g/dL (ref 3.5–5.0)
Alkaline Phosphatase: 94 U/L (ref 38–126)
Anion gap: 11 (ref 5–15)
BUN: 36 mg/dL — ABNORMAL HIGH (ref 8–23)
CO2: 27 mmol/L (ref 22–32)
Calcium: 9.6 mg/dL (ref 8.9–10.3)
Chloride: 104 mmol/L (ref 98–111)
Creatinine: 1.4 mg/dL — ABNORMAL HIGH (ref 0.61–1.24)
GFR, Estimated: 57 mL/min — ABNORMAL LOW (ref >60)
Glucose, Bld: 156 mg/dL — ABNORMAL HIGH (ref 70–99)
Potassium: 4.1 mmol/L (ref 3.5–5.1)
Sodium: 142 mmol/L (ref 135–145)
Total Bilirubin: 0.3 mg/dL (ref 0.3–1.2)
Total Protein: 6.3 g/dL — ABNORMAL LOW (ref 6.5–8.1)

## 2023-04-11 LAB — URIC ACID: Uric Acid, Serum: 8.8 mg/dL — ABNORMAL HIGH (ref 3.7–8.6)

## 2023-04-11 LAB — MAGNESIUM: Magnesium: 1.8 mg/dL (ref 1.7–2.4)

## 2023-04-11 LAB — SAMPLE TO BLOOD BANK

## 2023-04-11 LAB — LACTATE DEHYDROGENASE: LDH: 386 U/L — ABNORMAL HIGH (ref 98–192)

## 2023-04-11 MED ORDER — SODIUM CHLORIDE 0.9 % IV SOLN: Freq: Once | INTRAVENOUS | Status: AC

## 2023-04-11 MED ORDER — SODIUM CHLORIDE FLUSH 0.9 % IV SOLN
10.0000 mL | Freq: Once | INTRAVENOUS | Status: AC
  Administered 2023-04-11: 10 mL via INTRAVENOUS
  Filled 2023-04-11: qty 10

## 2023-04-11 MED ORDER — LORAZEPAM 2 MG/ML IJ SOLN
0.5000 mg | Freq: Once | INTRAMUSCULAR | Status: AC
  Administered 2023-04-11: 0.5 mg via INTRAVENOUS
  Filled 2023-04-11: qty 1

## 2023-04-11 MED ORDER — LORAZEPAM 0.5 MG PO TABS
0.5000 mg | ORAL_TABLET | Freq: Four times a day (QID) | ORAL | 0 refills | Status: DC | PRN
  Filled 2023-04-11: qty 20, 5d supply, fill #0

## 2023-04-11 MED ORDER — HEPARIN SOD (PORK) LOCK FLUSH 100 UNIT/ML IV SOLN
500.0000 [IU] | Freq: Once | INTRAVENOUS | Status: AC
  Administered 2023-04-11: 500 [IU] via INTRAVENOUS

## 2023-04-11 NOTE — Telephone Encounter (Signed)
Call received from pt to Dr. Gustavo Lah office.  Call answered by Dr. Myna Hidalgo.  Pt states that he is having difficulty walking.  Pt notified per Dr. Myna Hidalgo to come in to the office now to be seen and for blood work.  Pt agrees to coming in now.  Message sent to scheduling.

## 2023-04-11 NOTE — Progress Notes (Signed)
Hematology and Oncology Follow Up Visit  Brian [name] [id number] 10-Oct-1961 62 y.o. 04/11/2023   Principle Diagnosis:  Recurrent Hodgkin's Disease -  S/p CAR-T therapy -- relapsed TIA-resolved Iron deficiency anemia-iron malabsorption  Current Therapy:  ANVD -- s/p cycle #5 -- start on 10/25/2022 Nivolumab q 3 month -- s/p cycle #18 -- on hold XRT -- Involved field --completed on 08/06/2022  Neulasta 6 mg IM post each chemotherapy   IV iron-Monoferric-given on 03/04/2023     Interim History:  [name] is in for an unscheduled visit.  His girlfriend called saying that he was having a very hard time.  He is very weak.  He was not really able to walk all that well.  All this started after he had his Neulasta injection.  He has had Neulasta in the past.  He has had no problems with Neulasta.  He has not been doing chemotherapy.  He has had a good response to chemotherapy today.  When he came in, he certainly did not look the same.  He was somewhat lethargic.  He was having some visual hallucinations.  I know that this can happen with Parkinson's.  He was not eating or drinking a lot.  Thankfully, he did not fall.  He had no fever.  There was no diarrhea.  He had no cough.  There is no bleeding.  He had no rashes.  He does not complain of any headache.  When I did look at his eyes, he did have a little bit of weakness to seem like with the right eye musculature.  Overall, I would have said that his performance status right now is probably ECOG 2-3.  Medications:  Current Outpatient Medications:    LORazepam (ATIVAN) 0.5 MG tablet, Take 1 tablet (0.5 mg total) by mouth every 6 (six) hours as needed for anxiety., Disp: 20 tablet, Rfl: 0   AMBULATORY NON FORMULARY MEDICATION, Please dispense one abdominal compression binder, Disp: 1 Device, Rfl: 0   carbidopa-levodopa (SINEMET CR) 50-200 MG tablet, TAKE 1 TABLET BY MOUTH AT BEDTIME, Disp: 90 tablet, Rfl: 0   carbidopa-levodopa (SINEMET  IR) 25-100 MG tablet, TAKE 2 TABLETS AT 7AM, 11AM, 3 PM and 7PM (Patient taking differently: 3 (three) times daily. 02/08/2023 TAKE 2 TABLETS AT 7AM, 11AM, 3 PM.), Disp: 720 tablet, Rfl: 0   cholecalciferol (VITAMIN D3) 25 MCG (1000 UNIT) tablet, Take 1,000 Units by mouth daily., Disp: , Rfl:    dexamethasone (DECADRON) 4 MG tablet, Take 2 tablets (8 mg) by mouth daily for 3 days after chemotherapy. Take with food., Disp: 90 tablet, Rfl: 3   diphenhydramine-acetaminophen (TYLENOL PM) 25-500 MG TABS tablet, Take 1 tablet by mouth at bedtime as needed., Disp: , Rfl:    pramipexole (MIRAPEX) 0.5 MG tablet, TAKE 1 TABLET(0.5 MG) BY MOUTH THREE TIMES DAILY, Disp: 270 tablet, Rfl: 0 No current facility-administered medications for this visit.  Facility-Administered Medications Ordered in Other Visits:    sodium chloride flush (NS) 0.9 % injection 10 mL, 10 mL, Intracatheter, PRN, [name], MD, 10 mL at 03/19/23 1338  Allergies: No Known Allergies  Past Medical History, Surgical history, Social history, and Family History were reviewed and updated.  Review of Systems: Review of Systems  Neurological:  Positive for tremors.  All other systems reviewed and are negative.    Physical Exam:  oral temperature is 98.2 F (36.8 C). His blood pressure is 105/67 and his pulse is 111 (abnormal). His respiration is 17  and oxygen saturation is 99%.   Physical Exam Vitals reviewed.  HENT:     Head: Normocephalic and atraumatic.  Eyes:     Pupils: Pupils are equal, round, and reactive to light.     Comments: It seems as if he had some slow movement of his right eyeball.  He did have some eyelid twitching.  Cardiovascular:     Rate and Rhythm: Normal rate and regular rhythm.     Heart sounds: Normal heart sounds.  Pulmonary:     Effort: Pulmonary effort is normal.     Breath sounds: Normal breath sounds.  Abdominal:     General: Bowel sounds are normal.     Palpations: Abdomen is soft.   Musculoskeletal:        General: No tenderness or deformity. Normal range of motion.     Cervical back: Normal range of motion.  Lymphadenopathy:     Cervical: No cervical adenopathy.  Skin:    General: Skin is warm and dry.     Findings: No erythema or rash.  Neurological:     Mental Status: He is alert and oriented to person, place, and time.     Comments: He has a lot of involuntary motions.  He states some visual hallucinations.  He had maybe a little bit of confusion.  He had good strength bilaterally.  He had decent range of motion of his joints.  Psychiatric:        Behavior: Behavior normal.        Thought Content: Thought content normal.        Judgment: Judgment normal.     Lab Results  Component Value Date   WBC 15.6 (H) 04/11/2023   HGB 9.2 (L) 04/11/2023   HCT 29.3 (L) 04/11/2023   MCV 98.7 04/11/2023   PLT 124 (L) 04/11/2023     Chemistry      Component Value Date/Time   NA 142 04/11/2023 1325   NA 142 04/02/2017 0000   NA 140 07/19/2016 1305   K 4.1 04/11/2023 1325   K 4.4 07/19/2016 1305   CL 104 04/11/2023 1325   CL 105 02/08/2016 1117   CL 107 03/25/2013 0828   CO2 27 04/11/2023 1325   CO2 26 07/19/2016 1305   BUN 36 (H) 04/11/2023 1325   BUN 21 04/02/2017 0000   BUN 22.8 07/19/2016 1305   CREATININE 1.40 (H) 04/11/2023 1325   CREATININE 1.1 07/19/2016 1305   GLU 105 04/02/2017 0000      Component Value Date/Time   CALCIUM 9.6 04/11/2023 1325   CALCIUM 9.8 07/19/2016 1305   ALKPHOS 94 04/11/2023 1325   ALKPHOS 76 07/19/2016 1305   AST 12 (L) 04/11/2023 1325   AST 18 07/19/2016 1305   ALT <5 04/11/2023 1325   ALT 14 07/19/2016 1305   BILITOT 0.3 04/11/2023 1325   BILITOT 0.70 07/19/2016 1305      Impression and Plan: Brian [name] is a 62 year old gentleman.  He has history of recurrent Hodgkin's disease. 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 He then had a recurrence after this.  He did undergo CAR[facility]T therapy at  [facility].  I believe he had this in April 2017.  He did well with this.  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  This was proven by biopsy.  He had radiation therapy for this.  He subsequently has had another recurrence.  This was in the right axilla.  This  was biopsy-proven.  He did undergo radiation therapy for this.  This was completed in September/2023.  Now, we have another recurrence.    He currently is on chemotherapy with ANVD.  I think he is doing well with this.  His last PET scan showed that he was in remission.  So not sure how to explain the current neurological symptoms.  I just hate that I feels this way.  We gave him IV fluid.  He seemed to perk up quite a bit.  He actually ate while he was in the clinic.  He had no nausea or vomiting.  He was able to walk out of of the clinic.  I will still not sure what might be going on.  I do think that an MRI of the brain would not be a bad idea.  We will try to get 1 on him tonight.  His labs looked okay.  As noted that his LDH was up a little bit.  This could certainly have been from the Neulasta.  I will let him come back to the clinic tomorrow.  I would like to repeat his labs.  He might need some more IV fluid.  Hopefully, he will be able to have the MRI tonight.     [name], MD 5/16/20244:44 PM

## 2023-04-11 NOTE — Telephone Encounter (Signed)
Left message with after hour service on 04-11-23 at 12:48 pm   Caller states that she is taking patient to his oncologist and he is on Chemo. She states that he fatigue, trembling, instability and weakness

## 2023-04-11 NOTE — Patient Instructions (Signed)
Weakness ?Weakness is a lack of strength. You may feel weak all over your body (generalized), or you may feel weak in one part of your body (focal). There are many potential causes of weakness. Sometimes, the cause of your weakness may not be known. Some causes of weakness can be serious, so it is important to see your doctor. ?Follow these instructions at home: ?Activity ?Rest as needed. ?Try to get enough sleep. Most adults need 7-8 hours of sleep each night. Talk to your doctor about how much sleep you need. ?Do exercises, such as arm curls and leg raises, for 30 minutes at least 2 days a week or as told by your doctor. ?Think about working with a physical therapist or trainer to help you get stronger. ?General instructions ? ?Take over-the-counter and prescription medicines only as told by your doctor. ?Eat a healthy, well-balanced diet. This includes: ?Proteins to build muscles, such as lean meats and fish. ?Fresh fruits and vegetables. ?Carbohydrates to boost energy, such as whole grains. ?Drink enough fluid to keep your pee (urine) pale yellow. ?Keep all follow-up visits. ?Contact a doctor if: ?Your weakness does not get better or it gets worse. ?Your weakness affects your ability to: ?Think clearly. ?Do your normal daily activities. ?Get help right away if: ?You have sudden weakness on one side of your face or body. ?You have chest pain. ?You have trouble breathing or shortness of breath. ?You have problems with how you see (vision). ?You have trouble talking or swallowing. ?You have trouble standing or walking. ?You are light-headed or faint. ?These symptoms may be an emergency. Get help right away. Call 911. ?Do not wait to see if the symptoms will go away. ?Do not drive yourself to the hospital. ?Summary ?Weakness is a lack of strength. You may feel weak all over your body or just in one part of your body. ?There are many potential causes of weakness. Sometimes, the cause of your weakness may not be  known. ?Rest as needed, and try to get enough sleep. Most adults need 7-8 hours of sleep each night. ?Eat a healthy, well-balanced diet. ?This information is not intended to replace advice given to you by your health care provider. Make sure you discuss any questions you have with your health care provider. ?Document Revised: 10/15/2021 Document Reviewed: 10/15/2021 ?Elsevier Patient Education ? 2023 Elsevier Inc. ? ?

## 2023-04-11 NOTE — Telephone Encounter (Signed)
Called patient back and they are on the way to oncologist and doing labs

## 2023-04-11 NOTE — Telephone Encounter (Signed)
Pt's girlfriend Olegario Messier called through the answering service stating Chanc is on the way to his oncologist for an appointment. States she would like to speak to [facility] about pt's symptoms. States he is having weakness, feet feel numb, and feels very unstable. States she would like a call back as soon as possible.

## 2023-04-11 NOTE — Patient Instructions (Signed)

## 2023-04-11 NOTE — Telephone Encounter (Signed)
Pt called in requesting to speak with [facility]. Did not want to give details.

## 2023-04-12 ENCOUNTER — Inpatient Hospital Stay: Payer: Commercial Managed Care - PPO

## 2023-04-12 ENCOUNTER — Inpatient Hospital Stay (HOSPITAL_BASED_OUTPATIENT_CLINIC_OR_DEPARTMENT_OTHER): Payer: Commercial Managed Care - PPO | Admitting: Hematology & Oncology

## 2023-04-12 VITALS — BP 117/90 | HR 105

## 2023-04-12 DIAGNOSIS — C8195 Hodgkin lymphoma, unspecified, lymph nodes of inguinal region and lower limb: Secondary | ICD-10-CM

## 2023-04-12 DIAGNOSIS — Z5111 Encounter for antineoplastic chemotherapy: Secondary | ICD-10-CM | POA: Diagnosis not present

## 2023-04-12 LAB — CBC WITH DIFFERENTIAL (CANCER CENTER ONLY)
Abs Immature Granulocytes: 2.94 10*3/uL — ABNORMAL HIGH (ref 0.00–0.07)
Basophils Absolute: 0.1 10*3/uL (ref 0.0–0.1)
Basophils Relative: 1 %
Eosinophils Absolute: 0 10*3/uL (ref 0.0–0.5)
Eosinophils Relative: 0 %
HCT: 28.3 % — ABNORMAL LOW (ref 39.0–52.0)
Hemoglobin: 8.8 g/dL — ABNORMAL LOW (ref 13.0–17.0)
Immature Granulocytes: 20 %
Lymphocytes Relative: 7 %
Lymphs Abs: 0.9 10*3/uL (ref 0.7–4.0)
MCH: 30.9 pg (ref 26.0–34.0)
MCHC: 31.1 g/dL (ref 30.0–36.0)
MCV: 99.3 fL (ref 80.0–100.0)
Monocytes Absolute: 2.7 10*3/uL — ABNORMAL HIGH (ref 0.1–1.0)
Monocytes Relative: 19 %
Neutro Abs: 7.7 10*3/uL (ref 1.7–7.7)
Neutrophils Relative %: 53 %
Platelet Count: 129 10*3/uL — ABNORMAL LOW (ref 150–400)
RBC: 2.85 MIL/uL — ABNORMAL LOW (ref 4.22–5.81)
RDW: 17.4 % — ABNORMAL HIGH (ref 11.5–15.5)
Smear Review: NORMAL
WBC Count: 14.4 10*3/uL — ABNORMAL HIGH (ref 4.0–10.5)
nRBC: 1.7 % — ABNORMAL HIGH (ref 0.0–0.2)

## 2023-04-12 LAB — CMP (CANCER CENTER ONLY)
ALT: <5 U/L (ref 0–44)
AST: 12 U/L — ABNORMAL LOW (ref 15–41)
Albumin: 4.1 g/dL (ref 3.5–5.0)
Alkaline Phosphatase: 88 U/L (ref 38–126)
Anion gap: 10 (ref 5–15)
BUN: 28 mg/dL — ABNORMAL HIGH (ref 8–23)
CO2: 29 mmol/L (ref 22–32)
Calcium: 9.6 mg/dL (ref 8.9–10.3)
Chloride: 105 mmol/L (ref 98–111)
Creatinine: 1.3 mg/dL — ABNORMAL HIGH (ref 0.61–1.24)
GFR, Estimated: >60 mL/min (ref >60)
Glucose, Bld: 108 mg/dL — ABNORMAL HIGH (ref 70–99)
Potassium: 4.3 mmol/L (ref 3.5–5.1)
Sodium: 144 mmol/L (ref 135–145)
Total Bilirubin: 0.3 mg/dL (ref 0.3–1.2)
Total Protein: 6.1 g/dL — ABNORMAL LOW (ref 6.5–8.1)

## 2023-04-12 LAB — MAGNESIUM: Magnesium: 1.9 mg/dL (ref 1.7–2.4)

## 2023-04-12 LAB — SAMPLE TO BLOOD BANK

## 2023-04-12 LAB — LACTATE DEHYDROGENASE: LDH: 387 U/L — ABNORMAL HIGH (ref 98–192)

## 2023-04-12 LAB — URIC ACID: Uric Acid, Serum: 8.6 mg/dL (ref 3.7–8.6)

## 2023-04-12 MED ORDER — SODIUM CHLORIDE 0.9 % IV SOLN: Freq: Once | INTRAVENOUS | Status: AC

## 2023-04-12 MED ORDER — HEPARIN SOD (PORK) LOCK FLUSH 100 UNIT/ML IV SOLN
500.0000 [IU] | Freq: Once | INTRAVENOUS | Status: AC
  Administered 2023-04-12: 500 [IU] via INTRAVENOUS

## 2023-04-12 MED ORDER — SODIUM CHLORIDE 0.9% FLUSH
10.0000 mL | Freq: Once | INTRAVENOUS | Status: AC
  Administered 2023-04-12: 10 mL via INTRAVENOUS

## 2023-04-12 NOTE — Progress Notes (Signed)
Hematology and Oncology Follow Up Visit  Brian [name] [id number] 1961-03-22 62 y.o. 04/12/2023   Principle Diagnosis:  Recurrent Hodgkin's Disease -  S/p CAR-T therapy -- relapsed TIA-resolved Iron deficiency anemia-iron malabsorption  Current Therapy:  ANVD -- s/p cycle #5 -- start on 10/25/2022 Nivolumab q 3 month -- s/p cycle #18 -- on hold XRT -- Involved field --completed on 08/06/2022  Neulasta 6 mg IM post each chemotherapy   IV iron-Monoferric-given on 03/04/2023     Interim History:  Brian [name] is in for an unscheduled visit.  His girlfriend called saying that he was having a very hard time.  He is very weak.  He was not really able to walk all that well.  All this started after he had his Neulasta injection.  He has had Neulasta in the past.  He has had no problems with Neulasta.  He has not been doing chemotherapy.  He has had a good response to chemotherapy today.  When he came in, he certainly did not look the same.  He was somewhat lethargic.  He was having some visual hallucinations.  I know that this can happen with Parkinson's.  He was not eating or drinking a lot.  Thankfully, he did not fall.  He had no fever.  There was no diarrhea.  He had no cough.  There is no bleeding.  He had no rashes.  He does not complain of any headache.  When I did look at his eyes, he did have a little bit of weakness to seem like with the right eye musculature.  Overall, I would have said that his performance status right now is probably ECOG 2-3.  Medications:  Current Outpatient Medications:    AMBULATORY NON FORMULARY MEDICATION, Please dispense one abdominal compression binder, Disp: 1 Device, Rfl: 0   carbidopa-levodopa (SINEMET CR) 50-200 MG tablet, TAKE 1 TABLET BY MOUTH AT BEDTIME, Disp: 90 tablet, Rfl: 0   carbidopa-levodopa (SINEMET IR) 25-100 MG tablet, TAKE 2 TABLETS AT 7AM, 11AM, 3 PM and 7PM (Patient taking differently: 3 (three) times daily. 02/08/2023 TAKE 2 TABLETS AT  7AM, 11AM, 3 PM.), Disp: 720 tablet, Rfl: 0   cholecalciferol (VITAMIN D3) 25 MCG (1000 UNIT) tablet, Take 1,000 Units by mouth daily., Disp: , Rfl:    dexamethasone (DECADRON) 4 MG tablet, Take 2 tablets (8 mg) by mouth daily for 3 days after chemotherapy. Take with food., Disp: 90 tablet, Rfl: 3   diphenhydramine-acetaminophen (TYLENOL PM) 25-500 MG TABS tablet, Take 1 tablet by mouth at bedtime as needed., Disp: , Rfl:    LORazepam (ATIVAN) 0.5 MG tablet, Take 1 tablet (0.5 mg total) by mouth every 6 (six) hours as needed for anxiety., Disp: 20 tablet, Rfl: 0   pramipexole (MIRAPEX) 0.5 MG tablet, TAKE 1 TABLET(0.5 MG) BY MOUTH THREE TIMES DAILY, Disp: 270 tablet, Rfl: 0 No current facility-administered medications for this visit.  Facility-Administered Medications Ordered in Other Visits:    sodium chloride flush (NS) 0.9 % injection 10 mL, 10 mL, Intracatheter, PRN, [name], MD, 10 mL at 03/19/23 1338  Allergies: No Known Allergies  Past Medical History, Surgical history, Social history, and Family History were reviewed and updated.  Review of Systems: Review of Systems  Neurological:  Positive for tremors.  All other systems reviewed and are negative.    Physical Exam:  vitals were not taken for this visit.   Physical Exam Vitals reviewed.  HENT:     Head: Normocephalic and  atraumatic.  Eyes:     Pupils: Pupils are equal, round, and reactive to light.     Comments: It seems as if he had some slow movement of his right eyeball.  He did have some eyelid twitching.  Cardiovascular:     Rate and Rhythm: Normal rate and regular rhythm.     Heart sounds: Normal heart sounds.  Pulmonary:     Effort: Pulmonary effort is normal.     Breath sounds: Normal breath sounds.  Abdominal:     General: Bowel sounds are normal.     Palpations: Abdomen is soft.  Musculoskeletal:        General: No tenderness or deformity. Normal range of motion.     Cervical back: Normal range  of motion.  Lymphadenopathy:     Cervical: No cervical adenopathy.  Skin:    General: Skin is warm and dry.     Findings: No erythema or rash.  Neurological:     Mental Status: He is alert and oriented to person, place, and time.     Comments: He has a lot of involuntary motions.  He states some visual hallucinations.  He had maybe a little bit of confusion.  He had good strength bilaterally.  He had decent range of motion of his joints.  Psychiatric:        Behavior: Behavior normal.        Thought Content: Thought content normal.        Judgment: Judgment normal.     Lab Results  Component Value Date   WBC 14.4 (H) 04/12/2023   HGB 8.8 (L) 04/12/2023   HCT 28.3 (L) 04/12/2023   MCV 99.3 04/12/2023   PLT 129 (L) 04/12/2023     Chemistry      Component Value Date/Time   NA 144 04/12/2023 0825   NA 142 04/02/2017 0000   NA 140 07/19/2016 1305   K 4.3 04/12/2023 0825   K 4.4 07/19/2016 1305   CL 105 04/12/2023 0825   CL 105 02/08/2016 1117   CL 107 03/25/2013 0828   CO2 29 04/12/2023 0825   CO2 26 07/19/2016 1305   BUN 28 (H) 04/12/2023 0825   BUN 21 04/02/2017 0000   BUN 22.8 07/19/2016 1305   CREATININE 1.30 (H) 04/12/2023 0825   CREATININE 1.1 07/19/2016 1305   GLU 105 04/02/2017 0000      Component Value Date/Time   CALCIUM 9.6 04/12/2023 0825   CALCIUM 9.8 07/19/2016 1305   ALKPHOS 88 04/12/2023 0825   ALKPHOS 76 07/19/2016 1305   AST 12 (L) 04/12/2023 0825   AST 18 07/19/2016 1305   ALT <5 04/12/2023 0825   ALT 14 07/19/2016 1305   BILITOT 0.3 04/12/2023 0825   BILITOT 0.70 07/19/2016 1305      Impression and Plan: Brian [name] is a 62 year old gentleman.  He has history of recurrent Hodgkin's disease. 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 He then had a recurrence after this.  He did undergo CAR-T therapy at [facility].  I believe he had this in April 2017.  He did well with this.  Unfortunately, he had another relapse in his  spine.  This was proven by biopsy.  He had radiation therapy for this.  He subsequently has had another recurrence.  This was in the right axilla.  This was biopsy-proven.  He did undergo radiation therapy for this.  This was completed in September/2023.  Now, we have  another recurrence.    He currently is on chemotherapy with ANVD.  I think he is doing well with this.  His last PET scan showed that he was in remission.  So not sure how to explain the current neurological symptoms.  I just hate that I feels this way.  We gave him IV fluid.  He seemed to perk up quite a bit.  He actually ate while he was in the clinic.  He had no nausea or vomiting.  He was able to walk out of of the clinic.  I will still not sure what might be going on.  I do think that an MRI of the brain would not be a bad idea.  We will try to get 1 on him tonight.  His labs looked okay.  As noted that his LDH was up a little bit.  This could certainly have been from the Neulasta.  I will let him come back to the clinic tomorrow.  I would like to repeat his labs.  He might need some more IV fluid.  Hopefully, he will be able to have the MRI tonight.     [name], MD 5/17/202412:04 PM   ADDENDUM: Brian [name] is doing much better today.  He walked in.  He is stronger.  He still has a Parkinson's changes.  He ate well last night when he got home.  I think the Ativan helped him quite a bit.  He was able to rest a lot better.  I still would like to get an MRI of the brain.  Unfortunately, his  insurance company has not approved this yet.  We will continue him on our regular program now.  His girlfriend really has helped out so much.  He is so fortunate to have her.  Christin Bach, MD

## 2023-04-12 NOTE — Patient Instructions (Signed)
Dehydration, Adult Dehydration is a condition in which there is not enough water or other fluids in the body. This happens when a person loses more fluids than they take in. Important organs cannot work right without the right amount of fluids. Any loss of fluids from the body can cause dehydration. Dehydration can be mild, worse, or very bad. It should be treated right away to keep it from getting very bad. What are the causes? Conditions that cause loss of water in the body. They include: Watery poop (diarrhea). Vomiting. Sweating a lot. Fever. Infection. Peeing (urinating) a lot. Not drinking enough fluids. Certain medicines, such as medicines that take extra fluid out of the body (diuretics). Lack of safe drinking water. Not being able to get enough water and food. What increases the risk? Having a long-term (chronic) illness that has not been treated the right way, such as: Diabetes. Heart disease. Kidney disease. Being 65 years of age or older. Having a disability. Living in a place that is high above the ground or sea (high in altitude). The thinner, drier air causes more fluid loss. Doing exercises that put stress on your body for a long time. Being active when in hot places. What are the signs or symptoms? Symptoms of dehydration depend on how bad it is. Mild or worse dehydration Thirst. Dry lips or dry mouth. Feeling dizzy or light-headed. Muscle cramps. Passing little pee or dark pee. Pee may be the color of tea. Headache. Very bad dehydration Changes in skin. Skin may: Be cold to the touch (clammy). Be blotchy or pale. Not go back to normal right after you pinch it and let it go. Little or no tears, pee, or sweat. Fast breathing. Low blood pressure. Weak pulse. Pulse that is more than 100 beats a minute when you are sitting still. Other changes, such as: Feeling very thirsty. Eyes that look hollow (sunken). Cold hands and feet. Being confused. Being very  tired (lethargic) or having trouble waking from sleep. Losing weight. Loss of consciousness. How is this treated? Treatment for this condition depends on how bad your dehydration is. Treatment should start right away. Do not wait until your condition gets very bad. Very bad dehydration is an emergency. You will need to go to a hospital. Mild or worse dehydration can be treated at home. You may be asked to: Drink more fluids. Drink an oral rehydration solution (ORS). This drink gives you the right amount of fluids, salts, and minerals (electrolytes). Very bad dehydration can be treated: With fluids through an IV tube. By correcting low levels of electrolytes in the body. By treating the problem that caused your dehydration. Follow these instructions at home: Oral rehydration solution If told by your doctor, drink an ORS: Make an ORS. Use instructions on the package. Start by drinking small amounts, about  cup (120 mL) every 5-10 minutes. Slowly drink more until you have had the amount that your doctor said to have.  Eating and drinking  Drink enough clear fluid to keep your pee pale yellow. If you were told to drink an ORS, finish the ORS first. Then, start slowly drinking other clear fluids. Drink fluids such as: Water. Do not drink only water. Doing that can make the salt (sodium) level in your body get too low. Water from ice chips you suck on. Fruit juice that you have added water to (diluted). Low-calorie sports drinks. Eat foods that have the right amounts of salts and minerals, such as bananas, oranges, potatoes,   tomatoes, or spinach. Do not drink alcohol. Avoid drinks that have caffeine or sugar. These include:: High-calorie sports drinks. Fruit juice that you did not add water to. Soda. Coffee or energy drinks. Avoid foods that are greasy or have a lot of fat or sugar. General instructions Take over-the-counter and prescription medicines only as told by your doctor. Do  not take sodium tablets. Doing that can make the salt level in your body get too high. Return to your normal activities as told by your doctor. Ask your doctor what activities are safe for you. Keep all follow-up visits. Your doctor may check and change your treatment. Contact a doctor if: You have pain in your belly (abdomen) and the pain: Gets worse. Stays in one place. You have a rash. You have a stiff neck. You get angry or annoyed more easily than normal. You are more tired or have a harder time waking than normal. You feel weak or dizzy. You feel very thirsty. Get help right away if: You have any symptoms of very bad dehydration. You vomit every time you eat or drink. Your vomiting gets worse, does not go away, or you vomit blood or green stuff. You are getting treatment, but symptoms are getting worse. You have a fever. You have a very bad headache. You have: Diarrhea that gets worse or does not go away. Blood in your poop (stool). This may cause poop to look black and tarry. No pee in 6-8 hours. Only a small amount of pee in 6-8 hours, and the pee is very dark. You have trouble breathing. These symptoms may be an emergency. Get help right away. Call 911. Do not wait to see if the symptoms will go away. Do not drive yourself to the hospital. This information is not intended to replace advice given to you by your health care provider. Make sure you discuss any questions you have with your health care provider. Document Revised: 06/11/2022 Document Reviewed: 06/11/2022 Elsevier Patient Education  2023 Elsevier Inc.  

## 2023-04-12 NOTE — Patient Instructions (Signed)

## 2023-04-13 ENCOUNTER — Ambulatory Visit (HOSPITAL_COMMUNITY)
Admission: RE | Admit: 2023-04-13 | Discharge: 2023-04-13 | Disposition: A | Discharge status: Home / Self Care | Payer: Commercial Managed Care - PPO | Source: Ambulatory Visit | Attending: Hematology & Oncology | Admitting: Hematology & Oncology

## 2023-04-13 ENCOUNTER — Encounter (HOSPITAL_COMMUNITY): Payer: Self-pay | Admitting: Family Medicine

## 2023-04-13 DIAGNOSIS — C8195 Hodgkin lymphoma, unspecified, lymph nodes of inguinal region and lower limb: Secondary | ICD-10-CM

## 2023-04-13 DIAGNOSIS — G20C Parkinsonism, unspecified: Secondary | ICD-10-CM | POA: Insufficient documentation

## 2023-04-13 MED ORDER — HEPARIN SOD (PORK) LOCK FLUSH 100 UNIT/ML IV SOLN: 500.0000 [IU] | INTRAVENOUS | Status: DC | PRN

## 2023-04-13 MED ORDER — GADOBUTROL 1 MMOL/ML IV SOLN
8.0000 mL | Freq: Once | INTRAVENOUS | Status: AC | PRN
  Administered 2023-04-13: 8 mL via INTRAVENOUS

## 2023-04-16 ENCOUNTER — Inpatient Hospital Stay: Payer: Commercial Managed Care - PPO

## 2023-04-16 ENCOUNTER — Other Ambulatory Visit: Payer: Commercial Managed Care - PPO

## 2023-04-16 ENCOUNTER — Ambulatory Visit: Payer: Commercial Managed Care - PPO

## 2023-04-16 NOTE — Telephone Encounter (Signed)
Talked to patients girlfriend Olegario Messier after several update calls. Patient was very dehydrated and had a n MRI that showed no new activity and patient is feeling much better at this time

## 2023-04-17 ENCOUNTER — Telehealth: Payer: Self-pay

## 2023-04-17 NOTE — Telephone Encounter (Signed)
Pt called in stating that he has had loose stools and diarrhea x the past couple weeks. Pt has not had this every single day but often. Pt stated that he has been constipated on his Parkinson's medication (Sinemet) and I informed him that iron normally has the same effect. Pt would like to know if he should continue the iron and if so, if this is permanent.   Per Dr Myna Hidalgo ok to stop the otc iron. This is not a permanent medication to take. Pt advised and states understanding. Advised him to contact us with any further concerns or questions.

## 2023-04-19 ENCOUNTER — Ambulatory Visit (HOSPITAL_COMMUNITY)
Admission: RE | Admit: 2023-04-19 | Discharge: 2023-04-19 | Disposition: A | Discharge status: Home / Self Care | Payer: Commercial Managed Care - PPO | Source: Ambulatory Visit | Attending: Hematology & Oncology | Admitting: Hematology & Oncology

## 2023-04-19 DIAGNOSIS — I071 Rheumatic tricuspid insufficiency: Secondary | ICD-10-CM | POA: Diagnosis not present

## 2023-04-19 DIAGNOSIS — C8195 Hodgkin lymphoma, unspecified, lymph nodes of inguinal region and lower limb: Secondary | ICD-10-CM | POA: Insufficient documentation

## 2023-04-19 DIAGNOSIS — I10 Essential (primary) hypertension: Secondary | ICD-10-CM | POA: Insufficient documentation

## 2023-04-22 LAB — ECHOCARDIOGRAM LIMITED
AR max vel: 2.57 cm2
AV Area VTI: 2.34 cm2
AV Area mean vel: 2.13 cm2
AV Mean grad: 2 mmHg
AV Peak grad: 3.1 mmHg
Ao pk vel: 0.88 m/s
Area-P 1/2: 5.13 cm2
Calc EF: 53.6 %
MV VTI: 3.02 cm2
S' Lateral: 3.5 cm
Single Plane A2C EF: 54.2 %
Single Plane A4C EF: 50.7 %

## 2023-04-23 ENCOUNTER — Inpatient Hospital Stay: Payer: Commercial Managed Care - PPO

## 2023-04-23 ENCOUNTER — Telehealth: Payer: Self-pay

## 2023-04-23 VITALS — BP 101/64 | HR 104

## 2023-04-23 DIAGNOSIS — Z5111 Encounter for antineoplastic chemotherapy: Secondary | ICD-10-CM | POA: Diagnosis not present

## 2023-04-23 DIAGNOSIS — C8195 Hodgkin lymphoma, unspecified, lymph nodes of inguinal region and lower limb: Secondary | ICD-10-CM

## 2023-04-23 LAB — CMP (CANCER CENTER ONLY)
ALT: <5 U/L (ref 0–44)
AST: 9 U/L — ABNORMAL LOW (ref 15–41)
Albumin: 4.1 g/dL (ref 3.5–5.0)
Alkaline Phosphatase: 67 U/L (ref 38–126)
Anion gap: 6 (ref 5–15)
BUN: 28 mg/dL — ABNORMAL HIGH (ref 8–23)
CO2: 27 mmol/L (ref 22–32)
Calcium: 9.4 mg/dL (ref 8.9–10.3)
Chloride: 107 mmol/L (ref 98–111)
Creatinine: 1.25 mg/dL — ABNORMAL HIGH (ref 0.61–1.24)
GFR, Estimated: >60 mL/min (ref >60)
Glucose, Bld: 89 mg/dL (ref 70–99)
Potassium: 4.3 mmol/L (ref 3.5–5.1)
Sodium: 140 mmol/L (ref 135–145)
Total Bilirubin: 0.3 mg/dL (ref 0.3–1.2)
Total Protein: 5.8 g/dL — ABNORMAL LOW (ref 6.5–8.1)

## 2023-04-23 LAB — CBC WITH DIFFERENTIAL (CANCER CENTER ONLY)
Abs Immature Granulocytes: 0.06 10*3/uL (ref 0.00–0.07)
Basophils Absolute: 0.1 10*3/uL (ref 0.0–0.1)
Basophils Relative: 1 %
Eosinophils Absolute: 0.1 10*3/uL (ref 0.0–0.5)
Eosinophils Relative: 2 %
HCT: 28.6 % — ABNORMAL LOW (ref 39.0–52.0)
Hemoglobin: 8.7 g/dL — ABNORMAL LOW (ref 13.0–17.0)
Immature Granulocytes: 1 %
Lymphocytes Relative: 10 %
Lymphs Abs: 0.7 10*3/uL (ref 0.7–4.0)
MCH: 30.5 pg (ref 26.0–34.0)
MCHC: 30.4 g/dL (ref 30.0–36.0)
MCV: 100.4 fL — ABNORMAL HIGH (ref 80.0–100.0)
Monocytes Absolute: 1 10*3/uL (ref 0.1–1.0)
Monocytes Relative: 15 %
Neutro Abs: 4.9 10*3/uL (ref 1.7–7.7)
Neutrophils Relative %: 71 %
Platelet Count: 364 10*3/uL (ref 150–400)
RBC: 2.85 MIL/uL — ABNORMAL LOW (ref 4.22–5.81)
RDW: 17.7 % — ABNORMAL HIGH (ref 11.5–15.5)
WBC Count: 6.9 10*3/uL (ref 4.0–10.5)
nRBC: 0 % (ref 0.0–0.2)

## 2023-04-23 MED ORDER — SODIUM CHLORIDE 0.9 % IV SOLN: Freq: Once | INTRAVENOUS | Status: AC

## 2023-04-23 MED ORDER — SODIUM CHLORIDE 0.9 % IV SOLN
10.0000 mg | Freq: Once | INTRAVENOUS | Status: AC
  Administered 2023-04-23: 10 mg via INTRAVENOUS
  Filled 2023-04-23: qty 10

## 2023-04-23 MED ORDER — PALONOSETRON HCL INJECTION 0.25 MG/5ML
0.2500 mg | Freq: Once | INTRAVENOUS | Status: AC
  Administered 2023-04-23: 0.25 mg via INTRAVENOUS
  Filled 2023-04-23: qty 5

## 2023-04-23 MED ORDER — SODIUM CHLORIDE 0.9 % IV SOLN
150.0000 mg | Freq: Once | INTRAVENOUS | Status: AC
  Administered 2023-04-23: 150 mg via INTRAVENOUS
  Filled 2023-04-23: qty 150

## 2023-04-23 MED ORDER — SODIUM CHLORIDE 0.9 % IV SOLN
240.0000 mg | Freq: Once | INTRAVENOUS | Status: AC
  Administered 2023-04-23: 240 mg via INTRAVENOUS
  Filled 2023-04-23: qty 24

## 2023-04-23 MED ORDER — VINBLASTINE SULFATE CHEMO INJECTION 1 MG/ML
6.0000 mg/m2 | Freq: Once | INTRAVENOUS | Status: AC
  Administered 2023-04-23: 11.9 mg via INTRAVENOUS
  Filled 2023-04-23: qty 11.9

## 2023-04-23 MED ORDER — SODIUM CHLORIDE 0.9% FLUSH
10.0000 mL | INTRAVENOUS | Status: DC | PRN
  Administered 2023-04-23: 10 mL

## 2023-04-23 MED ORDER — HEPARIN SOD (PORK) LOCK FLUSH 100 UNIT/ML IV SOLN
500.0000 [IU] | Freq: Once | INTRAVENOUS | Status: AC | PRN
  Administered 2023-04-23: 500 [IU]

## 2023-04-23 MED ORDER — DOXORUBICIN HCL CHEMO IV INJECTION 2 MG/ML
25.0000 mg/m2 | Freq: Once | INTRAVENOUS | Status: AC
  Administered 2023-04-23: 50 mg via INTRAVENOUS
  Filled 2023-04-23: qty 25

## 2023-04-23 MED ORDER — LACTATED RINGERS IV SOLN
250.0000 mg/m2 | Freq: Once | INTRAVENOUS | Status: AC
  Administered 2023-04-23: 500 mg via INTRAVENOUS
  Filled 2023-04-23: qty 50

## 2023-04-23 MED ORDER — SODIUM CHLORIDE 0.9 % IV SOLN
375.0000 mg/m2 | Freq: Once | INTRAVENOUS | Status: AC
  Administered 2023-04-23: 800 mg via INTRAVENOUS
  Filled 2023-04-23: qty 80

## 2023-04-23 NOTE — Patient Instructions (Signed)

## 2023-04-23 NOTE — Telephone Encounter (Signed)
-----   Message from [name], MD sent at 04/23/2023  7:00 AM EDT ----- Call and let him know that the heart is functioning quite well.  He still has an excellent heart function of 55-60%.  Thanks.  Cindee Lame

## 2023-04-23 NOTE — Telephone Encounter (Signed)
Pt notified while in infusion room. Pt verbalized understanding and had no further questions.

## 2023-04-23 NOTE — Patient Instructions (Signed)
[facility]  Discharge Instructions: Thank you for choosing [facility] to provide your oncology and hematology care.   If you have a lab appointment with the Cancer Center, please go directly to the Cancer Center and check in at the registration area.  Wear comfortable clothing and clothing appropriate for easy access to any Portacath or PICC line.   We strive to give you quality time with your provider. You may need to reschedule your appointment if you arrive late (15 or more minutes).  Arriving late affects you and other patients whose appointments are after yours.  Also, if you miss three or more appointments without notifying the office, you may be dismissed from the clinic at the provider's discretion.      For prescription refill requests, have your pharmacy contact our office and allow 72 hours for refills to be completed.    Today you received the following chemotherapy and/or immunotherapy agents adria, velban, DTIC    To help prevent nausea and vomiting after your treatment, we encourage you to take your nausea medication as directed.  BELOW ARE SYMPTOMS THAT SHOULD BE REPORTED IMMEDIATELY: *FEVER GREATER THAN 100.4 F (38 C) OR HIGHER *CHILLS OR SWEATING *NAUSEA AND VOMITING THAT IS NOT CONTROLLED WITH YOUR NAUSEA MEDICATION *UNUSUAL SHORTNESS OF BREATH *UNUSUAL BRUISING OR BLEEDING *URINARY PROBLEMS (pain or burning when urinating, or frequent urination) *BOWEL PROBLEMS (unusual diarrhea, constipation, pain near the anus) TENDERNESS IN MOUTH AND THROAT WITH OR WITHOUT PRESENCE OF ULCERS (sore throat, sores in mouth, or a toothache) UNUSUAL RASH, SWELLING OR PAIN  UNUSUAL VAGINAL DISCHARGE OR ITCHING   Items with * indicate a potential emergency and should be followed up as soon as possible or go to the Emergency Department if any problems should occur.  Please show the CHEMOTHERAPY ALERT CARD or IMMUNOTHERAPY ALERT CARD at  check-in to the Emergency Department and triage nurse. Should you have questions after your visit or need to cancel or reschedule your appointment, please contact [facility]  [phone] and follow the prompts.  Office hours are 8:00 a.m. to 4:30 p.m. Monday - Friday. Please note that voicemails left after 4:00 p.m. may not be returned until the following business day.  We are closed weekends and major holidays. You have access to a nurse at all times for urgent questions. Please call the main number to the clinic [phone] and follow the prompts.  For any non-urgent questions, you may also contact your provider using MyChart. We now offer e-Visits for anyone 84 and older to request care online for non-urgent symptoms. For details visit [email or web address].   Also download the MyChart app! Go to the app store, search "MyChart", open the app, select , and log in with your MyChart username and password.

## 2023-04-23 NOTE — Progress Notes (Signed)
Ok to infuse Dacarbazine over 60 minutes today.   Anola Gurney Whitney, [place], BCPS, BCOP 04/23/2023 1:59 PM

## 2023-05-08 ENCOUNTER — Telehealth: Payer: Self-pay | Admitting: Neurology

## 2023-05-08 NOTE — Telephone Encounter (Signed)
Pt called in and left a message. He would like to update [name] on his status with his chemo treatments.

## 2023-05-09 NOTE — Telephone Encounter (Signed)
Patient is done with Chemo right now waiting on Pet scan. Patient having good and bad days feeling very stiff and rigid. Some days he can do ok and go for a walk some days he is afraid to leave the house because he will freeze up. Patient unable to go to PT for around a month due to his treatments making him feel so tired . Patient just wanting to update Dr. Arbutus Leas and patient is following up one time at [facility] in July to see if they have anything else to offer as far as trials or anything else

## 2023-05-21 ENCOUNTER — Other Ambulatory Visit: Payer: Self-pay | Admitting: Neurology

## 2023-05-21 ENCOUNTER — Telehealth: Payer: Self-pay | Admitting: Neurology

## 2023-05-21 DIAGNOSIS — G20A1 Parkinson's disease without dyskinesia, without mention of fluctuations: Secondary | ICD-10-CM

## 2023-05-21 NOTE — Telephone Encounter (Signed)
Pt girlfriend would like to speak to someone about patient and the spells he is having problems with walking after going on a trip

## 2023-05-21 NOTE — Telephone Encounter (Signed)
Patient is freezing a lot he is having pain and numbness in his legs and feet and feeling fuzzy headed. Patient went on a trip and really wore himself out and feeling bad lately needing to rest more

## 2023-05-21 NOTE — Telephone Encounter (Signed)
Pt went on a trip and now his girlfriend is concern about some things going on with him. He is having some pain and some mobility issues  please call

## 2023-05-23 ENCOUNTER — Telehealth: Payer: Self-pay | Admitting: *Deleted

## 2023-05-23 ENCOUNTER — Other Ambulatory Visit: Payer: Self-pay | Admitting: *Deleted

## 2023-05-23 MED ORDER — AMOXICILLIN 500 MG PO CAPS: ORAL_CAPSULE | ORAL | 0 refills | Status: DC

## 2023-05-23 NOTE — Telephone Encounter (Signed)
Message received from patient stating that he has a dental appt this morning and would like to know if it is ok with Dr. Myna Hidalgo that he has a dental cleaning.  Dr. Myna Hidalgo notified. Call placed back to patient and patient notified per order of Dr. Myna Hidalgo that he should take Amoxicillin 2 grams PO one hour prior to dental cleaning. Teach back done.  Pt requests that Amoxicillin prescription be sent to the [facility] on [address].  Prescription sent per pt.'s request.

## 2023-05-28 ENCOUNTER — Inpatient Hospital Stay: Payer: Commercial Managed Care - PPO

## 2023-05-28 ENCOUNTER — Other Ambulatory Visit: Payer: Self-pay

## 2023-05-28 ENCOUNTER — Inpatient Hospital Stay: Payer: Commercial Managed Care - PPO | Attending: Hematology & Oncology

## 2023-05-28 ENCOUNTER — Encounter: Payer: Self-pay | Admitting: Hematology & Oncology

## 2023-05-28 ENCOUNTER — Ambulatory Visit (HOSPITAL_BASED_OUTPATIENT_CLINIC_OR_DEPARTMENT_OTHER)
Admission: RE | Admit: 2023-05-28 | Discharge: 2023-05-28 | Disposition: A | Discharge status: Home / Self Care | Payer: Commercial Managed Care - PPO | Source: Ambulatory Visit | Attending: Hematology & Oncology | Admitting: Hematology & Oncology

## 2023-05-28 ENCOUNTER — Inpatient Hospital Stay (HOSPITAL_BASED_OUTPATIENT_CLINIC_OR_DEPARTMENT_OTHER): Payer: Commercial Managed Care - PPO | Admitting: Hematology & Oncology

## 2023-05-28 ENCOUNTER — Other Ambulatory Visit: Payer: Self-pay | Admitting: Neurology

## 2023-05-28 VITALS — BP 116/68 | HR 83 | Temp 98.1°F | Resp 18 | Ht 70.0 in | Wt 172.0 lb

## 2023-05-28 DIAGNOSIS — K909 Intestinal malabsorption, unspecified: Secondary | ICD-10-CM | POA: Diagnosis not present

## 2023-05-28 DIAGNOSIS — D509 Iron deficiency anemia, unspecified: Secondary | ICD-10-CM | POA: Diagnosis not present

## 2023-05-28 DIAGNOSIS — Z7901 Long term (current) use of anticoagulants: Secondary | ICD-10-CM | POA: Insufficient documentation

## 2023-05-28 DIAGNOSIS — G20A1 Parkinson's disease without dyskinesia, without mention of fluctuations: Secondary | ICD-10-CM

## 2023-05-28 DIAGNOSIS — Z9484 Stem cells transplant status: Secondary | ICD-10-CM | POA: Diagnosis not present

## 2023-05-28 DIAGNOSIS — Z95828 Presence of other vascular implants and grafts: Secondary | ICD-10-CM

## 2023-05-28 DIAGNOSIS — C8195 Hodgkin lymphoma, unspecified, lymph nodes of inguinal region and lower limb: Secondary | ICD-10-CM

## 2023-05-28 DIAGNOSIS — Z8673 Personal history of transient ischemic attack (TIA), and cerebral infarction without residual deficits: Secondary | ICD-10-CM | POA: Diagnosis not present

## 2023-05-28 DIAGNOSIS — C8198 Hodgkin lymphoma, unspecified, lymph nodes of multiple sites: Secondary | ICD-10-CM | POA: Diagnosis not present

## 2023-05-28 DIAGNOSIS — Z923 Personal history of irradiation: Secondary | ICD-10-CM | POA: Insufficient documentation

## 2023-05-28 DIAGNOSIS — R21 Rash and other nonspecific skin eruption: Secondary | ICD-10-CM

## 2023-05-28 DIAGNOSIS — R634 Abnormal weight loss: Secondary | ICD-10-CM

## 2023-05-28 LAB — MAGNESIUM: Magnesium: 1.9 mg/dL (ref 1.7–2.4)

## 2023-05-28 LAB — CMP (CANCER CENTER ONLY)
ALT: <5 U/L (ref 0–44)
AST: 9 U/L — ABNORMAL LOW (ref 15–41)
Albumin: 4.2 g/dL (ref 3.5–5.0)
Alkaline Phosphatase: 68 U/L (ref 38–126)
Anion gap: 6 (ref 5–15)
BUN: 30 mg/dL — ABNORMAL HIGH (ref 8–23)
CO2: 28 mmol/L (ref 22–32)
Calcium: 9.4 mg/dL (ref 8.9–10.3)
Chloride: 106 mmol/L (ref 98–111)
Creatinine: 1.17 mg/dL (ref 0.61–1.24)
GFR, Estimated: >60 mL/min (ref >60)
Glucose, Bld: 89 mg/dL (ref 70–99)
Potassium: 4.2 mmol/L (ref 3.5–5.1)
Sodium: 140 mmol/L (ref 135–145)
Total Bilirubin: 0.4 mg/dL (ref 0.3–1.2)
Total Protein: 6.4 g/dL — ABNORMAL LOW (ref 6.5–8.1)

## 2023-05-28 LAB — CBC WITH DIFFERENTIAL (CANCER CENTER ONLY)
Abs Immature Granulocytes: 0.01 10*3/uL (ref 0.00–0.07)
Basophils Absolute: 0.1 10*3/uL (ref 0.0–0.1)
Basophils Relative: 1 %
Eosinophils Absolute: 0.3 10*3/uL (ref 0.0–0.5)
Eosinophils Relative: 5 %
HCT: 31.8 % — ABNORMAL LOW (ref 39.0–52.0)
Hemoglobin: 9.9 g/dL — ABNORMAL LOW (ref 13.0–17.0)
Immature Granulocytes: 0 %
Lymphocytes Relative: 11 %
Lymphs Abs: 0.6 10*3/uL — ABNORMAL LOW (ref 0.7–4.0)
MCH: 30.9 pg (ref 26.0–34.0)
MCHC: 31.1 g/dL (ref 30.0–36.0)
MCV: 99.4 fL (ref 80.0–100.0)
Monocytes Absolute: 0.8 10*3/uL (ref 0.1–1.0)
Monocytes Relative: 14 %
Neutro Abs: 3.8 10*3/uL (ref 1.7–7.7)
Neutrophils Relative %: 69 %
Platelet Count: 236 10*3/uL (ref 150–400)
RBC: 3.2 MIL/uL — ABNORMAL LOW (ref 4.22–5.81)
RDW: 16 % — ABNORMAL HIGH (ref 11.5–15.5)
WBC Count: 5.5 10*3/uL (ref 4.0–10.5)
nRBC: 0 % (ref 0.0–0.2)

## 2023-05-28 LAB — URIC ACID: Uric Acid, Serum: 6.1 mg/dL (ref 3.7–8.6)

## 2023-05-28 LAB — TSH: TSH: 5.231 u[IU]/mL — ABNORMAL HIGH (ref 0.350–4.500)

## 2023-05-28 LAB — SAMPLE TO BLOOD BANK

## 2023-05-28 LAB — LACTATE DEHYDROGENASE: LDH: 195 U/L — ABNORMAL HIGH (ref 98–192)

## 2023-05-28 MED ORDER — RIVAROXABAN (XARELTO) VTE STARTER PACK (15 & 20 MG): ORAL_TABLET | ORAL | 0 refills | Status: DC

## 2023-05-28 MED ORDER — SODIUM CHLORIDE 0.9% FLUSH
10.0000 mL | Freq: Once | INTRAVENOUS | Status: AC
  Administered 2023-05-28: 10 mL

## 2023-05-28 MED ORDER — HEPARIN SOD (PORK) LOCK FLUSH 100 UNIT/ML IV SOLN: 500.0000 [IU] | Freq: Once | INTRAVENOUS | Status: DC

## 2023-05-28 MED ORDER — HEPARIN SOD (PORK) LOCK FLUSH 100 UNIT/ML IV SOLN
500.0000 [IU] | Freq: Once | INTRAVENOUS | Status: AC
  Administered 2023-05-28: 500 [IU] via INTRAVENOUS

## 2023-05-28 NOTE — Progress Notes (Addendum)
Hematology and Oncology Follow Up Visit  Brian [name] [id number] 21-Dec-1960 62 y.o. 05/28/2023   Principle Diagnosis:  Recurrent Hodgkin's Disease -  S/p CAR-T therapy -- relapsed TIA-resolved Iron deficiency anemia-iron malabsorption  Current Therapy:  ANVD -- s/p cycle #6 -- start on 10/25/2022 -completed on 04/23/2023 Nivolumab q 3 month -- s/p cycle #18 -- on hold XRT -- Involved field --completed on 08/06/2022  Neulasta 6 mg IM post each chemotherapy   IV iron-Monoferric-given on 03/04/2023     Interim History:  [name] is in for follow-up.  He is doing pretty well.  He completed chemotherapy about 6 weeks ago.  He is due for a PET scan later on this week.  His only complaint is been some occasional pain and swelling in the right lower leg.  Given that he has a Parkinson's, has been on chemotherapy, and has not had that great mobility, it would not be a bad idea to get a Doppler of his leg to make sure there is no thromboembolic disease.  He has had no cough or shortness of breath.  He has had no change in bowel or bladder habits.  He does see Dr. Arbutus Leas of Neurology for the Parkinson's.  He had echocardiogram on 04/19/2023.  This showed a left ventricular ejection fraction of 55-60%.  His appetite has been quite good.  He has had no problems with nausea or vomiting.  He has had no bleeding.  He has had no headache.  Breve overall, I would say that his performance status is probably ECOG 1.    Medications:  Current Outpatient Medications:    AMBULATORY NON FORMULARY MEDICATION, Please dispense one abdominal compression binder, Disp: 1 Device, Rfl: 0   amoxicillin (AMOXIL) 500 MG capsule, Amoxicillin 2 grams by mouth one hour prior to dental cleaning, Disp: 4 capsule, Rfl: 0   carbidopa-levodopa (SINEMET CR) 50-200 MG tablet, TAKE 1 TABLET BY MOUTH AT BEDTIME, Disp: 90 tablet, Rfl: 0   carbidopa-levodopa (SINEMET IR) 25-100 MG tablet, TAKE 2 TABLETS AT 7, 2 TABLETS AT 11, 2 TABLETS  AT 3 PM, AND 2 TABLETS AT 7 PM, Disp: 720 tablet, Rfl: 0   cholecalciferol (VITAMIN D3) 25 MCG (1000 UNIT) tablet, Take 1,000 Units by mouth daily., Disp: , Rfl:    dexamethasone (DECADRON) 4 MG tablet, Take 2 tablets (8 mg) by mouth daily for 3 days after chemotherapy. Take with food., Disp: 90 tablet, Rfl: 3   diphenhydramine-acetaminophen (TYLENOL PM) 25-500 MG TABS tablet, Take 1 tablet by mouth at bedtime as needed., Disp: , Rfl:    LORazepam (ATIVAN) 0.5 MG tablet, Take 1 tablet (0.5 mg total) by mouth every 6 (six) hours as needed for anxiety., Disp: 20 tablet, Rfl: 0   pramipexole (MIRAPEX) 0.5 MG tablet, TAKE 1 TABLET(0.5 MG) BY MOUTH THREE TIMES DAILY, Disp: 270 tablet, Rfl: 0 No current facility-administered medications for this visit.  Facility-Administered Medications Ordered in Other Visits:    heparin lock flush 100 unit/mL, 500 Units, Intravenous, Once, Dakiya Puopolo, Rose Phi, MD   sodium chloride flush (NS) 0.9 % injection 10 mL, 10 mL, Intracatheter, PRN, [name], MD, 10 mL at 03/19/23 1338   sodium chloride flush (NS) 0.9 % injection 10 mL, 10 mL, Intracatheter, Once, Luciel Brickman, Rose Phi, MD  Allergies: No Known Allergies  Past Medical History, Surgical history, Social history, and Family History were reviewed and updated.  Review of Systems: Review of Systems  Neurological:  Positive for tremors.  All  other systems reviewed and are negative.    Physical Exam:  height is 5\' 10"  (1.778 m) and weight is 172 lb (78 kg). His oral temperature is 98.1 F (36.7 C). His blood pressure is 116/68 and his pulse is 83. His respiration is 18 and oxygen saturation is 95%.   Physical Exam Vitals reviewed.  HENT:     Head: Normocephalic and atraumatic.  Eyes:     Pupils: Pupils are equal, round, and reactive to light.  Cardiovascular:     Rate and Rhythm: Normal rate and regular rhythm.     Heart sounds: Normal heart sounds.  Pulmonary:     Effort: Pulmonary effort is normal.      Breath sounds: Normal breath sounds.  Abdominal:     General: Bowel sounds are normal.     Palpations: Abdomen is soft.  Musculoskeletal:        General: No tenderness or deformity. Normal range of motion.     Cervical back: Normal range of motion.     Comments: There may be a little bit of swelling in the right lower leg.  I cannot palpate any obvious venous cord.  There may be a questionable Homans' sign.  Lymphadenopathy:     Cervical: No cervical adenopathy.  Skin:    General: Skin is warm and dry.     Findings: No erythema or rash.  Neurological:     Mental Status: He is alert and oriented to person, place, and time.  Psychiatric:        Behavior: Behavior normal.        Thought Content: Thought content normal.        Judgment: Judgment normal.     Lab Results  Component Value Date   WBC 5.5 05/28/2023   HGB 9.9 (L) 05/28/2023   HCT 31.8 (L) 05/28/2023   MCV 99.4 05/28/2023   PLT 236 05/28/2023     Chemistry      Component Value Date/Time   NA 140 05/28/2023 0930   NA 142 04/02/2017 0000   NA 140 07/19/2016 1305   K 4.2 05/28/2023 0930   K 4.4 07/19/2016 1305   CL 106 05/28/2023 0930   CL 105 02/08/2016 1117   CL 107 03/25/2013 0828   CO2 28 05/28/2023 0930   CO2 26 07/19/2016 1305   BUN 30 (H) 05/28/2023 0930   BUN 21 04/02/2017 0000   BUN 22.8 07/19/2016 1305   CREATININE 1.17 05/28/2023 0930   CREATININE 1.1 07/19/2016 1305   GLU 105 04/02/2017 0000      Component Value Date/Time   CALCIUM 9.4 05/28/2023 0930   CALCIUM 9.8 07/19/2016 1305   ALKPHOS 68 05/28/2023 0930   ALKPHOS 76 07/19/2016 1305   AST 9 (L) 05/28/2023 0930   AST 18 07/19/2016 1305   ALT <5 05/28/2023 0930   ALT 14 07/19/2016 1305   BILITOT 0.4 05/28/2023 0930   BILITOT 0.70 07/19/2016 1305      Impression and Plan: Brian [name] is a 62 year old gentleman.  He has history of recurrent Hodgkin's disease. 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 He then  had a recurrence after this.  He did undergo CAR[facility]T therapy at [facility].  I believe he had this in April 2017.  He did well with this.  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  This was proven by biopsy.  He had radiation therapy for this.  He subsequently has had another  recurrence.  This was in the right axilla.  This was biopsy-proven.  He did undergo radiation therapy for this.  This was completed in September/2023.  Now, we have another recurrence.  He completed 6 cycles of chemotherapy with ANVD in May.  Hopefully, the PET scan will be normal.  I had to believe that I will be normal.  I would also think that the Doppler that he is going to have should be okay.  However, I do think it be worthwhile checking this out.    Again, we really want quality of life.  We also would like to have some quantity of life.  We will plan to see him back, if all looks okay, and 6 weeks.    [name], MD 7/2/202410:26 AM   ADDENDUM: Unfortunately, if he does have a thromboembolic event in the right leg.  Doppler shows that there is a thrombus in the femoral vein down to the popliteal vein.  I am little surprised by this.  However, I am not shocked.  We will have to get him on anticoagulation.  I do not see a problem with him being on anticoagulation.  We we will put him on Xarelto.  I will call in prescription for the starter pack.  I told him how to take the Xarelto.  I probably would repeat the Doppler in another couple months to see how well things have improved.  I just hate this for Mr. Cahn.  He already has enough issues.  He does not need anything else to worry about.

## 2023-05-28 NOTE — Addendum Note (Signed)
Addended by: Arlan Organ R on: 05/28/2023 12:34 PM   Modules accepted: Orders

## 2023-05-28 NOTE — Patient Instructions (Signed)

## 2023-05-29 ENCOUNTER — Ambulatory Visit (HOSPITAL_COMMUNITY)
Admission: RE | Admit: 2023-05-29 | Discharge: 2023-05-29 | Disposition: A | Discharge status: Home / Self Care | Payer: Commercial Managed Care - PPO | Source: Ambulatory Visit | Attending: Hematology & Oncology | Admitting: Hematology & Oncology

## 2023-05-29 ENCOUNTER — Other Ambulatory Visit: Payer: Self-pay

## 2023-05-29 DIAGNOSIS — C8195 Hodgkin lymphoma, unspecified, lymph nodes of inguinal region and lower limb: Secondary | ICD-10-CM

## 2023-05-29 DIAGNOSIS — C8198 Hodgkin lymphoma, unspecified, lymph nodes of multiple sites: Secondary | ICD-10-CM | POA: Diagnosis not present

## 2023-05-29 LAB — GLUCOSE, CAPILLARY: Glucose-Capillary: 87 mg/dL (ref 70–99)

## 2023-05-29 MED ORDER — FLUDEOXYGLUCOSE F - 18 (FDG) INJECTION
8.7000 | Freq: Once | INTRAVENOUS | Status: AC | PRN
  Administered 2023-05-29: 8.7 via INTRAVENOUS

## 2023-06-03 NOTE — Telephone Encounter (Signed)
Pt called in and wanted to know how long he will be on Xarelto for. He also wanted to know about any restrictions with exercise or OTC pain meds.  Spoke with pt and advised him that Dr Myna Hidalgo thinks he will be on the Xarelto for up to 2 years. He also said he would discuss monitoring the blood clot at his next apt in August. Advised pt there is not restrictions for exercising but that he needs to be careful since he is at increased risk for bleeding and to try and avoid injuries or falls. Advised him against taking additional blood thinners such as Aspirin on top of the Xarelto and suggested he use Tylenol as a first choice for OTC pain meds.

## 2023-06-05 ENCOUNTER — Other Ambulatory Visit: Payer: Self-pay | Admitting: Neurology

## 2023-06-09 ENCOUNTER — Other Ambulatory Visit: Payer: Self-pay

## 2023-06-14 ENCOUNTER — Telehealth: Payer: Self-pay | Admitting: Neurology

## 2023-06-14 NOTE — Telephone Encounter (Signed)
Pts (girlfriend) is calling in stating that there has been a lot of changes with is situation and the medications are not working in between he is having some episodes.  She would like to have a call back to see if the pt need to come in sooner than his appt at the end of the month.

## 2023-06-14 NOTE — Telephone Encounter (Signed)
Called patient and he is having more off periods and issues with freezing patient not driving at all any longer and wanting to adjust meds before 06-26-23 appointment

## 2023-06-17 ENCOUNTER — Other Ambulatory Visit: Payer: Self-pay

## 2023-06-18 ENCOUNTER — Ambulatory Visit (HOSPITAL_BASED_OUTPATIENT_CLINIC_OR_DEPARTMENT_OTHER)
Admission: RE | Admit: 2023-06-18 | Discharge: 2023-06-18 | Disposition: A | Discharge status: Home / Self Care | Payer: Commercial Managed Care - PPO | Source: Ambulatory Visit | Attending: Family | Admitting: Family

## 2023-06-18 ENCOUNTER — Ambulatory Visit (INDEPENDENT_AMBULATORY_CARE_PROVIDER_SITE_OTHER): Payer: Commercial Managed Care - PPO | Admitting: Family

## 2023-06-18 VITALS — BP 121/67 | HR 101 | Temp 97.9°F | Resp 16 | Wt 170.0 lb

## 2023-06-18 DIAGNOSIS — M25511 Pain in right shoulder: Secondary | ICD-10-CM | POA: Insufficient documentation

## 2023-06-18 DIAGNOSIS — I824Y1 Acute embolism and thrombosis of unspecified deep veins of right proximal lower extremity: Secondary | ICD-10-CM | POA: Diagnosis not present

## 2023-06-18 NOTE — Assessment & Plan Note (Addendum)
History of humerus fracture and rotator cuff injury in November. Recent fall with increased right shoulder pain, but no bruising or swelling. . Likely exacerbation of previous rotator cuff injury. -Order shoulder x-ray to rule out fracture. -Advise to minimize use and apply ice for pain relief. -offered orthopedic referral but he declined at this time.

## 2023-06-18 NOTE — Assessment & Plan Note (Signed)
Diagnosed earlier this month.  Being managed by hematology/oncology.   Deep Vein Thrombosis (DVT):  -Continue Xarelto as prescribed by Hematology -Advise to avoid NSAIDs and aspirin due to increased risk of stomach bleeding. -Expect repeat Doppler in a couple of months per hematologist's note.

## 2023-06-18 NOTE — Progress Notes (Signed)
Subjective:     Patient ID: Brian [name], male    DOB: May 09, 1961, 62 y.o.   MRN: [id number]  Chief Complaint  Patient presents with   Fall    Patient reports a fall 2 weeks ago.    Shoulder Pain    Patient reports right shoulder and upper arm pain since falling. H/O previous injury same area    Fall  Shoulder Pain     Discussed the use of AI scribe software for clinical note transcription with the patient, who gave verbal consent to proceed.  History of Present Illness   The patient, with a history of Parkinson's disease and lymphoma, presents with shoulder pain following a fall at a theater. The patient fell on a marble floor, but managed to break the fall, landing in a push-up position. The patient did not hit his head or bleed, but has been experiencing aching in the right shoulder since the incident. The patient has a history of a broken humerus and a torn rotator cuff from a previous fall in November. The patient suspects that the recent fall may have aggravated the rotator cuff injury. The patient has been wearing a sling to manage the pain and has been taking over-the-counter pain medication. The patient also mentions having completed a 12-week course of chemotherapy for lymphoma. The patient is currently on Xarelto, a blood thinner, due to a blood clot found two months ago. The patient expresses concern about the duration of the blood thinner treatment and the risk of bleeding from minor cuts.          Health Maintenance Due  Topic Date Due   Zoster Vaccines- Shingrix (1 of 2) Never done   COVID-19 Vaccine (4 - 2023-24 season) 07/27/2022    Past Medical History:  Diagnosis Date   Anxiety    Dysrhythmia    past hx pvc   Goals of care, counseling/discussion 09/25/2018   H/O autologous stem cell transplant (HCC)    may 2014  at [facility]   History of Bell's palsy    2009  RIGHT SIDE--  HAS 80% FUNCTION / PT STATES A LITTLE ASYMETRICAL AND EFFECTS MOUTH   History of  radiation therapy 10/18/17-11/05/17   sprine T1 26 Gy in 13 fractions, spine boost 10 Gy in 5 fractions   History of radiation therapy    Right hip, Rt Sclav- 07/23/22-08/07/11- Dr. 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   Mass of right chest wall 09/18/2018   Mass of right inguinal region    Neuromuscular disorder (HCC)    bilateral feet neuropathy   PVC (premature ventricular contraction)    "benign"   TIA (transient ischemic attack) 02/2015   "probable TIA"   Wears contact lenses     Past Surgical History:  Procedure Laterality Date   AXILLARY LYMPH NODE BIOPSY Left 02/02/2013   Procedure: NEEDLE LOCALIZED AXILLARY LYMPH NODE BIOPSY;  Surgeon: [name], MD;  Location: [facility];  Service: General;  Laterality: Left;   BACK SURGERY  03/2021   BONE MARROW BIOPSY  08/26/2012   COLONOSCOPY  2021   CYST REMOVAL NECK Left 11/26/1978   IR IMAGING GUIDED PORT INSERTION  10/23/2022   LEFT INGUINAL LYMPH NODE BX  09/09/2012  LYMPH NODE BIOPSY N/A 07/28/2015   Procedure: LYMPH NODE BIOPSY;  Surgeon: Loreli Slot, MD;  Location: [facility] OR;  Service: Thoracic;  Laterality: N/A;   LYMPH NODE BIOPSY Right 06/29/2022   Procedure: RIGHT AXILLARY NODE BIOPSY;  Surgeon: Loreli Slot, MD;  Location: [facility] OR;  Service: Vascular;  Laterality: Right;   MASS EXCISION Right 09/19/2018   Procedure: EXCISION OF RIGHT CHEST WALL MASS ERAS PATHWAY;  Surgeon: Claud Kelp, MD;  Location: [facility];  Service: General;  Laterality: Right;   NODE DISSECTION Right 07/28/2015   Procedure: NODE DISSECTION;  Surgeon: Loreli Slot, MD;  Location: [facility] OR;  Service: Thoracic;  Laterality: Right;   PLEURA BIOPSY Left 05/31/2014   REMOVAL RIGHT  INGUINAL LYMPH NODES  08/16/2011   SCROTAL EXPLORATION Right 01/04/2014   Procedure: SCROTUM EXPLORATION   INGUINAL , EXCISION OF CYSTIC MASS OF RIGHT SPERMATIC CORD, WITH FROZEN SECTION;  Surgeon: Marcine Matar, MD;  Location: [facility];  Service: Urology;  Laterality: Right;   TRANSTHORACIC ECHOCARDIOGRAM  03/18/2013   MILD LVH/  EF 55-60%   VIDEO ASSISTED THORACOSCOPY Left 05/31/2014   Procedure: LEFT VIDEO ASSISTED THORACOSCOPY, PLEURAL BIOPSY;  Surgeon: Loreli Slot, MD;  Location: [facility] OR;  Service: Thoracic;  Laterality: Left;   VIDEO ASSISTED THORACOSCOPY Right 07/28/2015   Procedure: RIGHT VIDEO ASSISTED THORACOSCOPY;  Surgeon: Loreli Slot, MD;  Location: [facility] OR;  Service: Thoracic;  Laterality: Right;   VIDEO ASSISTED THORACOSCOPY (VATS)/ LYMPH NODE SAMPLING Right 07/28/2015   VIDEO ASSISTED THORACOSCOPY (VATS)/WEDGE RESECTION  05/31/2014   VIDEO BRONCHOSCOPY WITH ENDOBRONCHIAL ULTRASOUND  07/28/2015   VIDEO BRONCHOSCOPY WITH ENDOBRONCHIAL ULTRASOUND N/A 07/28/2015   Procedure: VIDEO BRONCHOSCOPY WITH ENDOBRONCHIAL ULTRASOUND;  Surgeon: Loreli Slot, MD;  Location: [facility] OR;  Service: Thoracic;  Laterality: N/A;    Family History  Problem Relation Age of Onset   Cancer Mother 59       ovarian   Ovarian cancer Mother 57   Heart disease Father    Stroke Father    Cancer Brother        testicular   Hypertension Brother    Healthy Son    Healthy Son    Hypertension Brother    Other Brother        CMT   Hypertension Brother     Social History   Socioeconomic History   Marital status: Divorced    Spouse name: Not on file   Number of children: 2   Years of education: Not on file   Highest education level: Bachelor's degree (e.g., BA, AB, BS)  Occupational History   Occupation: Best boy: [employer]  Tobacco Use   Smoking status: Never   Smokeless tobacco: Never  Vaping Use   Vaping status: Never Used   Substance and Sexual Activity   Alcohol use: Yes    Alcohol/week: 14.0 standard drinks of alcohol    Types: 7 Glasses of wine, 7 Cans of beer per week    Comment: one drink per night   Drug use: No   Sexual activity: Not Currently  Other Topics Concern   Not on file  Social History Narrative   DIVORCE. Education: [employer]. Exercise: jog/walk/ lift weights 7 days a week, 1-2 miles.   Right handed   2 children   Social Determinants of Health   Financial Resource Strain: Patient Declined (06/18/2023)   Overall Financial Resource Strain (CARDIA)    Difficulty  of Paying Living Expenses: Patient declined  Food Insecurity: Patient Declined (06/18/2023)   Hunger Vital Sign    Worried About Running Out of Food in the Last Year: Patient declined    Ran Out of Food in the Last Year: Patient declined  Transportation Needs: Unknown (06/18/2023)   PRAPARE - Administrator, Civil Service (Medical): Patient declined    Lack of Transportation (Non-Medical): No  Physical Activity: Insufficiently Active (06/18/2023)   Exercise Vital Sign    Days of Exercise per Week: 2 days    Minutes of Exercise per Session: 30 min  Stress: Stress Concern Present (06/18/2023)   [employer] of Occupational Health - Occupational Stress Questionnaire    Feeling of Stress : Rather much  Social Connections: Unknown (06/18/2023)   Social Connection and Isolation Panel [NHANES]    Frequency of Communication with Friends and Family: More than three times a week    Frequency of Social Gatherings with Friends and Family: Patient declined    Attends Religious Services: Patient declined    Database administrator or Organizations: Yes    Attends Banker Meetings: Patient declined    Marital Status: Divorced  Catering manager Violence: Not At Risk (01/25/2023)   Humiliation, Afraid, Rape, and Kick questionnaire    Fear of Current or Ex-Partner: No    Emotionally Abused: No    Physically  Abused: No    Sexually Abused: No    Outpatient Medications Prior to Visit  Medication Sig Dispense Refill   carbidopa-levodopa (SINEMET CR) 50-200 MG tablet TAKE 1 TABLET BY MOUTH AT BEDTIME 90 tablet 0   carbidopa-levodopa (SINEMET IR) 25-100 MG tablet TAKE 2 TABLETS AT 7, 2 TABLETS AT 11, 2 TABLETS AT 3 PM, AND 2 TABLETS AT 7 PM 720 tablet 0   cholecalciferol (VITAMIN D3) 25 MCG (1000 UNIT) tablet Take 1,000 Units by mouth daily.     diphenhydramine-acetaminophen (TYLENOL PM) 25-500 MG TABS tablet Take 1 tablet by mouth at bedtime as needed.     LORazepam (ATIVAN) 0.5 MG tablet Take 1 tablet (0.5 mg total) by mouth every 6 (six) hours as needed for anxiety. 20 tablet 0   pramipexole (MIRAPEX) 0.5 MG tablet TAKE 1 TABLET(0.5 MG) BY MOUTH THREE TIMES DAILY 270 tablet 0   RIVAROXABAN (XARELTO) VTE STARTER PACK (15 & 20 MG) Follow package directions: Take one 15mg  tablet by mouth twice a day. On day 22, switch to one 20mg  tablet once a day. Take with food. 51 each 0   AMBULATORY NON FORMULARY MEDICATION Please dispense one abdominal compression binder 1 Device 0   amoxicillin (AMOXIL) 500 MG capsule Amoxicillin 2 grams by mouth one hour prior to dental cleaning 4 capsule 0   dexamethasone (DECADRON) 4 MG tablet Take 2 tablets (8 mg) by mouth daily for 3 days after chemotherapy. Take with food. 90 tablet 3   Facility-Administered Medications Prior to Visit  Medication Dose Route Frequency Provider Last Rate Last Admin   sodium chloride flush (NS) 0.9 % injection 10 mL  10 mL Intracatheter PRN [name], MD   10 mL at 03/19/23 1338    No Known Allergies  ROS See HPI    Objective:    Physical Exam Constitutional:      Appearance: Normal appearance.  HENT:     Head: Normocephalic and atraumatic.  Musculoskeletal:     Comments: No swelling noted of there right shoulder. Decrease ROM with pain with abduction  Skin:  General: Skin is warm and dry.  Neurological:     Mental  Status: He is alert and oriented to person, place, and time.     Comments: Some parkinsons tremors noted in arms/legs  Psychiatric:        Mood and Affect: Mood normal.        Behavior: Behavior normal.      BP 121/67 (BP Location: Left Arm, Patient Position: Sitting, Cuff Size: Small)   Pulse (!) 101   Temp 97.9 F (36.6 C) (Oral)   Resp 16   Wt 170 lb (77.1 kg)   SpO2 99%   BMI 24.39 kg/m  Wt Readings from Last 3 Encounters:  06/18/23 170 lb (77.1 kg)  05/28/23 172 lb (78 kg)  04/02/23 168 lb (76.2 kg)       Assessment & Plan:   Problem List Items Addressed This Visit       Unprioritized   Right shoulder pain - Primary    History of humerus fracture and rotator cuff injury in November. Recent fall with increased right shoulder pain, but no bruising or swelling. . Likely exacerbation of previous rotator cuff injury. -Order shoulder x-ray to rule out fracture. -Advise to minimize use and apply ice for pain relief. -offered orthopedic referral but he declined at this time.       Relevant Orders   DG Shoulder Right   Acute deep vein thrombosis (DVT) of proximal vein of right lower extremity (HCC)    Diagnosed earlier this month.  Being managed by hematology/oncology.   Deep Vein Thrombosis (DVT):  -Continue Xarelto as prescribed by Hematology -Advise to avoid NSAIDs and aspirin due to increased risk of stomach bleeding. -Expect repeat Doppler in a couple of months per hematologist's note.       I have discontinued Arlys John T. Cragle's dexamethasone, AMBULATORY NON FORMULARY MEDICATION, and amoxicillin. I am also having him maintain his cholecalciferol, diphenhydramine-acetaminophen, LORazepam, carbidopa-levodopa, carbidopa-levodopa, Rivaroxaban Starter Pack (15 mg and 20 mg), and pramipexole.  No orders of the defined types were placed in this encounter.

## 2023-06-19 ENCOUNTER — Telehealth: Payer: Self-pay

## 2023-06-19 NOTE — Telephone Encounter (Signed)
Received phone call from patient inquiring about "what is the plan for day 22 of my xarelto?"  Pt educated that according to his prescription he is to switch to one 20 mg tablet once a day and to take with food. Pt verbalized understanding but stated "how long will I have to take this medicine and what happens when the pills run out?" Pt educated that when he is low on tablets a prescription can be called in and that according to his phone call on 7/8//2024 he was educated that he can be on the medicine up to 2 years. Pt stated that he doesn't understand why he has to be on it for so long. Pt educated that it is to help prevent another clot from forming. Pt states he doesn't like that as he is on so many other medications. Pt educated that he can discuss with Dr. Myna Hidalgo at his appt in August but our recommendation is to continue to take the xarelto as prescribed. Pt verbalized understanding and had no further questions.

## 2023-06-20 ENCOUNTER — Telehealth: Payer: Self-pay | Admitting: Family

## 2023-06-20 DIAGNOSIS — S4991XA Unspecified injury of right shoulder and upper arm, initial encounter: Secondary | ICD-10-CM

## 2023-06-20 NOTE — Telephone Encounter (Signed)
Please advise patient that his x-ray is negative for fracture.  Has he had any surgeries on his shoulder where they placed hardware? The radiologist thought they saw some hardware.

## 2023-06-21 NOTE — Telephone Encounter (Signed)
Pt notified of results.  He stated that he's never had any type of surgery on his shoulder before.

## 2023-06-24 NOTE — Telephone Encounter (Signed)
Reviewed the x-ray results again with pt.  I am not sure if the change in the old fracture displacement is due to his recent fall or if this is how it has been- last imaging was 2022.  Discussed that I would refer him to sports medicine for further evaluation. He reports shoulder pain is mild at this time.

## 2023-06-25 ENCOUNTER — Other Ambulatory Visit: Payer: Self-pay

## 2023-06-25 MED ORDER — RIVAROXABAN 20 MG PO TABS: 20.0000 mg | ORAL_TABLET | Freq: Every day | ORAL | 3 refills | Status: DC

## 2023-06-25 NOTE — Progress Notes (Unsigned)
Assessment/Plan:   1.  Parkinsons Disease  -Continue carbidopa/levodopa 25/100 and take 2 tablets at 7 AM, 2 tablets at 11 AM, 2 tablet at 3 PM, 2 tablet at 7 PM.             -Continue carbidopa/levodopa 50/200 at bedtime.  -add entacapone 200 mg, with first 3 dosages of levodopa for wearing off.  R/b/se discussed.             -Continue pramipexole 0.5 mg 3 times per day.  Higher dosages caused hallucinations.  -will consider inbrija in the future  -medications optimally dosed and working well right now  -Patient sought second opinion at [facility] with Dr. Baron Sane in May, 2023.  There was no significant changes in medication.  -pt has a f/u with the PA at [facility] on 8/29.  Discussed with patient that we certainly have no objection to this, but because of our access issues, we want him to decide where he prefers to receive care, knowing that it may be difficult to make a decision.  While we encourage second opinions, we do not encourage patients to go back and forth to both locations.  -discussed importance of building multidisciplinary care team.  Pt needs to add counseling.  Discussed at length.  -needs to use walker at all times.  Discussed that the cane isn't helpful.  Discussed [employer].  Information given.  Shown how to use  -discussed consistent exercise in form of stationary bike!    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  Patient has synkinetic reinnervation of the right face.   3.  Hodgkin's lymphoma             -PET scan in November, 2023 with evidence of recurrence again.  He has finished his chemotherapy again and is trying to regain strength   4.  Renal insufficiency             -monitoring.  Mild.  Has actually been improving over last few months   5.  Lumbar radiculopathy             -Patient had microdiscectomy Mar 29, 2021  -Microdiscectomy complicated by subdural CSF fluid collection at L1 vertebral body and L5-S1 level.  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  -Patient seen by [facility] neurology and [facility] neurosurgery.    -ambulation is affected by continued pain  6.  DVT  -Developed while on chemo and was a week and now on Xarelto.  -Patient is obviously a fall risk and we will need to monitor. Subjective:   Brian [name] was seen today in follow up for Parkinsons disease.  My previous records were reviewed prior to todays visit as well as outside records available to me.  Pt with girlfriend who supplements the hx. much has happened since our last visit.  Medical records are reviewed.  Numerous phone calls are reviewed from patient/girlfriend to our office.  Patient had been undergoing chemotherapy for recurrent Hodgkin's disease, which has certainly caused weakness and difficulty ambulating, especially on the days that he was getting the Neulasta shots.  He had also had some episodes of dehydration associated with chemotherapy, causing fatigue and increased tremor.  I am glad to report that the patient  has now completed chemotherapy.  Immediately postchemotherapy, he felt he was not able to start physical therapy, just because he was too weak.  His PET scan looked good in terms of response and there was no evidence of the Hodgkin's disease.  He did develop DVT and is now on Xarelto.  He reports today that chemo made neuropathy worse.  His stamina is increasing but he still needs a nap daily.  He has had a falls at [facility] but had a cane (not walker).  Continued back pain, esp in the AM when gets OOB.  He is taking an extra levodopa between 11am and 3pm.  Has some freezing at restaurants and trouble getting out from the restaurant once gets in.  Wearing off 30 min to 1 hour before next dosing  Current prescribed movement disorder medications: carbidopa/levodopa 25/100, 2 tablets at 7 AM, 2 tablets at 11  AM, 2 tablet at 3 PM, 2 tablet at 7 PM  carbidopa/levodopa 50/200 CR q hs  Pramipexole 0.5 mg 3 times per day (higher dosages with hallucinations)   PREVIOUS MEDICATIONS: carbidopa/levodopa 50/200 CR (d/c just because tried it for pain and still waking up with pain in back so d/c it); amantadine (he didn't want to take it and he stopped it - didn't think that dyskinesia was an issue); gabapentin without help for PN in past  ALLERGIES:  No Known Allergies  CURRENT MEDICATIONS:  Outpatient Encounter Medications as of 06/26/2023  Medication Sig   carbidopa-levodopa (SINEMET CR) 50-200 MG tablet TAKE 1 TABLET BY MOUTH AT BEDTIME   carbidopa-levodopa (SINEMET IR) 25-100 MG tablet TAKE 2 TABLETS AT 7, 2 TABLETS AT 11, 2 TABLETS AT 3 PM, AND 2 TABLETS AT 7 PM   cholecalciferol (VITAMIN D3) 25 MCG (1000 UNIT) tablet Take 1,000 Units by mouth daily.   diphenhydramine-acetaminophen (TYLENOL PM) 25-500 MG TABS tablet Take 1 tablet by mouth at bedtime as needed.   LORazepam (ATIVAN) 0.5 MG tablet Take 1 tablet (0.5 mg total) by mouth every 6 (six) hours as needed for anxiety.   pramipexole (MIRAPEX) 0.5 MG tablet TAKE 1 TABLET(0.5 MG) BY MOUTH THREE TIMES DAILY   rivaroxaban (XARELTO) 20 MG TABS tablet Take 1 tablet (20 mg total) by mouth daily with supper.   Facility-Administered Encounter Medications as of 06/26/2023  Medication   sodium chloride flush (NS) 0.9 % injection 10 mL    Objective:   PHYSICAL EXAMINATION:    VITALS:   Vitals:   06/26/23 0953  BP: (!) 136/90  Pulse: 90  SpO2: 98%  Weight: 171 lb (77.6 kg)     GEN:  The patient appears stated age and is in NAD. HEENT:  Normocephalic, atraumatic.  The mucous membranes are moist. The superficial temporal arteries are without ropiness or tenderness. CV:  RRR Lungs:  CTAB Neck/HEME:  There are no carotid bruits bilaterally.  Neurological examination:  Orientation: The patient is alert and oriented x3. Cranial nerves: There is  good facial symmetry (slight dec NL fold on R) with facial hypomimia. The speech is fluent and clear. Soft palate rises symmetrically and there is no tongue deviation. Hearing is intact to conversational tone. Sensation: Sensation is intact to light touch throughout Motor: Strength is at least antigravity x4.  Movement examination: Tone: There is normal tone in the normal Abnormal movements: there is LLE dyskinesia, very mild and minimal today.  No tremor. Coordination:  There is no decremation with RAM's with any form of RAMS, including alternating  supination and pronation of the forearm, hand opening and closing, finger taps, heel taps and toe taps.  Gait and Station: The patient pushes off to arise.  He has stutter steps initially and he ambulates fairly well in the hall.  He actually did well in the turn.  When he got back to the doorway, he did freeze.  I have reviewed and interpreted the following labs independently    Chemistry      Component Value Date/Time   NA 140 05/28/2023 0930   NA 142 04/02/2017 0000   NA 140 07/19/2016 1305   K 4.2 05/28/2023 0930   K 4.4 07/19/2016 1305   CL 106 05/28/2023 0930   CL 105 02/08/2016 1117   CL 107 03/25/2013 0828   CO2 28 05/28/2023 0930   CO2 26 07/19/2016 1305   BUN 30 (H) 05/28/2023 0930   BUN 21 04/02/2017 0000   BUN 22.8 07/19/2016 1305   CREATININE 1.17 05/28/2023 0930   CREATININE 1.1 07/19/2016 1305   GLU 105 04/02/2017 0000      Component Value Date/Time   CALCIUM 9.4 05/28/2023 0930   CALCIUM 9.8 07/19/2016 1305   ALKPHOS 68 05/28/2023 0930   ALKPHOS 76 07/19/2016 1305   AST 9 (L) 05/28/2023 0930   AST 18 07/19/2016 1305   ALT <5 05/28/2023 0930   ALT 14 07/19/2016 1305   BILITOT 0.4 05/28/2023 0930   BILITOT 0.70 07/19/2016 1305       Lab Results  Component Value Date   WBC 5.5 05/28/2023   HGB 9.9 (L) 05/28/2023   HCT 31.8 (L) 05/28/2023   MCV 99.4 05/28/2023   PLT 236 05/28/2023    Lab Results   Component Value Date   TSH 5.231 (H) 05/28/2023     Total time spent on today's visit was 40 minutes, including both face-to-face time and nonface-to-face time.  Time included that spent on review of records (prior notes available to me/labs/imaging if pertinent), discussing treatment and goals, answering patient's questions and coordinating care.  Cc:  Copland, Gwenlyn Found, MD

## 2023-06-26 ENCOUNTER — Other Ambulatory Visit: Payer: Self-pay

## 2023-06-26 ENCOUNTER — Ambulatory Visit: Payer: Commercial Managed Care - PPO | Admitting: Neurology

## 2023-06-26 ENCOUNTER — Encounter: Payer: Self-pay | Admitting: Neurology

## 2023-06-26 VITALS — BP 136/90 | HR 90 | Wt 171.0 lb

## 2023-06-26 DIAGNOSIS — G20B2 Parkinson's disease with dyskinesia, with fluctuations: Secondary | ICD-10-CM | POA: Diagnosis not present

## 2023-06-26 MED ORDER — ENTACAPONE 200 MG PO TABS: 200.0000 mg | ORAL_TABLET | Freq: Three times a day (TID) | ORAL | 1 refills | Status: DC

## 2023-06-27 ENCOUNTER — Other Ambulatory Visit: Payer: Self-pay

## 2023-07-05 ENCOUNTER — Telehealth: Payer: Self-pay | Admitting: Neurology

## 2023-07-05 NOTE — Telephone Encounter (Signed)
Pt is calling in stating that the new Rx entacapone (COMTAN) 200 MG is working for him but he has some questions about the side effects that it may have.  Pt would like to have a call back.

## 2023-07-05 NOTE — Telephone Encounter (Signed)
Patient advised of Dr.Tat note, Those would not be side effects of entacapone.  Have him keep taking the medication.    Per patient thank you just wanted to make sure the symptom he was having weren't not from the Entacapone. He really like the medication and feels like it is working.

## 2023-07-05 NOTE — Telephone Encounter (Signed)
Opened in error

## 2023-07-05 NOTE — Telephone Encounter (Addendum)
Per Patient, he is  having some achy feeling in his lower extremities, fatigue, tightness and soreness.

## 2023-07-09 ENCOUNTER — Other Ambulatory Visit: Payer: Commercial Managed Care - PPO

## 2023-07-09 ENCOUNTER — Ambulatory Visit: Payer: Commercial Managed Care - PPO | Admitting: Hematology & Oncology

## 2023-07-09 ENCOUNTER — Inpatient Hospital Stay: Payer: Commercial Managed Care - PPO

## 2023-07-11 ENCOUNTER — Inpatient Hospital Stay (HOSPITAL_BASED_OUTPATIENT_CLINIC_OR_DEPARTMENT_OTHER): Payer: Commercial Managed Care - PPO | Admitting: Hematology & Oncology

## 2023-07-11 ENCOUNTER — Encounter: Payer: Self-pay | Admitting: Hematology & Oncology

## 2023-07-11 ENCOUNTER — Inpatient Hospital Stay: Payer: Commercial Managed Care - PPO

## 2023-07-11 ENCOUNTER — Inpatient Hospital Stay: Payer: Commercial Managed Care - PPO | Attending: Hematology & Oncology

## 2023-07-11 ENCOUNTER — Other Ambulatory Visit: Payer: Self-pay

## 2023-07-11 ENCOUNTER — Ambulatory Visit: Payer: Commercial Managed Care - PPO | Admitting: Family Medicine

## 2023-07-11 VITALS — BP 126/79 | HR 75 | Temp 98.3°F | Resp 18 | Ht 70.0 in | Wt 175.0 lb

## 2023-07-11 DIAGNOSIS — D508 Other iron deficiency anemias: Secondary | ICD-10-CM | POA: Insufficient documentation

## 2023-07-11 DIAGNOSIS — M545 Low back pain, unspecified: Secondary | ICD-10-CM | POA: Insufficient documentation

## 2023-07-11 DIAGNOSIS — K909 Intestinal malabsorption, unspecified: Secondary | ICD-10-CM | POA: Diagnosis not present

## 2023-07-11 DIAGNOSIS — C8195 Hodgkin lymphoma, unspecified, lymph nodes of inguinal region and lower limb: Secondary | ICD-10-CM

## 2023-07-11 DIAGNOSIS — Z8673 Personal history of transient ischemic attack (TIA), and cerebral infarction without residual deficits: Secondary | ICD-10-CM | POA: Insufficient documentation

## 2023-07-11 DIAGNOSIS — Z9484 Stem cells transplant status: Secondary | ICD-10-CM | POA: Diagnosis not present

## 2023-07-11 DIAGNOSIS — G20A1 Parkinson's disease without dyskinesia, without mention of fluctuations: Secondary | ICD-10-CM | POA: Diagnosis not present

## 2023-07-11 DIAGNOSIS — Z7901 Long term (current) use of anticoagulants: Secondary | ICD-10-CM | POA: Diagnosis not present

## 2023-07-11 DIAGNOSIS — Z8571 Personal history of Hodgkin lymphoma: Secondary | ICD-10-CM | POA: Diagnosis present

## 2023-07-11 LAB — CBC WITH DIFFERENTIAL (CANCER CENTER ONLY)
Abs Immature Granulocytes: 0.02 10*3/uL (ref 0.00–0.07)
Basophils Absolute: 0.1 10*3/uL (ref 0.0–0.1)
Basophils Relative: 1 %
Eosinophils Absolute: 0.2 10*3/uL (ref 0.0–0.5)
Eosinophils Relative: 5 %
HCT: 34.3 % — ABNORMAL LOW (ref 39.0–52.0)
Hemoglobin: 10.9 g/dL — ABNORMAL LOW (ref 13.0–17.0)
Immature Granulocytes: 0 %
Lymphocytes Relative: 17 %
Lymphs Abs: 0.9 10*3/uL (ref 0.7–4.0)
MCH: 31 pg (ref 26.0–34.0)
MCHC: 31.8 g/dL (ref 30.0–36.0)
MCV: 97.4 fL (ref 80.0–100.0)
Monocytes Absolute: 0.8 10*3/uL (ref 0.1–1.0)
Monocytes Relative: 15 %
Neutro Abs: 3.2 10*3/uL (ref 1.7–7.7)
Neutrophils Relative %: 62 %
Platelet Count: 256 10*3/uL (ref 150–400)
RBC: 3.52 MIL/uL — ABNORMAL LOW (ref 4.22–5.81)
RDW: 14.4 % (ref 11.5–15.5)
WBC Count: 5.2 10*3/uL (ref 4.0–10.5)
nRBC: 0 % (ref 0.0–0.2)

## 2023-07-11 LAB — CMP (CANCER CENTER ONLY)
ALT: <5 U/L (ref 0–44)
AST: 12 U/L — ABNORMAL LOW (ref 15–41)
Albumin: 4.3 g/dL (ref 3.5–5.0)
Alkaline Phosphatase: 79 U/L (ref 38–126)
Anion gap: 7 (ref 5–15)
BUN: 50 mg/dL — ABNORMAL HIGH (ref 8–23)
CO2: 27 mmol/L (ref 22–32)
Calcium: 9.5 mg/dL (ref 8.9–10.3)
Chloride: 104 mmol/L (ref 98–111)
Creatinine: 1.59 mg/dL — ABNORMAL HIGH (ref 0.61–1.24)
GFR, Estimated: 49 mL/min — ABNORMAL LOW (ref >60)
Glucose, Bld: 101 mg/dL — ABNORMAL HIGH (ref 70–99)
Potassium: 4.6 mmol/L (ref 3.5–5.1)
Sodium: 138 mmol/L (ref 135–145)
Total Bilirubin: 0.4 mg/dL (ref 0.3–1.2)
Total Protein: 6.5 g/dL (ref 6.5–8.1)

## 2023-07-11 LAB — LACTATE DEHYDROGENASE: LDH: 210 U/L — ABNORMAL HIGH (ref 98–192)

## 2023-07-11 LAB — IRON AND IRON BINDING CAPACITY (CC-WL,HP ONLY)
Iron: 62 ug/dL (ref 45–182)
Saturation Ratios: 18 % (ref 17.9–39.5)
TIBC: 346 ug/dL (ref 250–450)
UIBC: 284 ug/dL (ref 117–376)

## 2023-07-11 LAB — FERRITIN: Ferritin: 136 ng/mL (ref 24–336)

## 2023-07-11 MED ORDER — HEPARIN SOD (PORK) LOCK FLUSH 100 UNIT/ML IV SOLN
500.0000 [IU] | Freq: Once | INTRAVENOUS | Status: AC
  Administered 2023-07-11: 500 [IU] via INTRAVENOUS

## 2023-07-11 MED ORDER — SODIUM CHLORIDE 0.9% FLUSH
10.0000 mL | INTRAVENOUS | Status: DC | PRN
  Administered 2023-07-11: 10 mL via INTRAVENOUS

## 2023-07-11 NOTE — Patient Instructions (Signed)

## 2023-07-11 NOTE — Progress Notes (Signed)
Hematology and Oncology Follow Up Visit  Brian [name] [id number] July 25, 1961 62 y.o. 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 daily -start on 05/29/2023      Interim History:  Brian [name] is in for follow-up.  His new problem has been the development of a thromboembolism in the right leg.  When we last saw him back in July, he was complaining of pain and swelling in the right leg.  I thought this was indicative of a thromboembolism.  We did do a Doppler.  This was done on 05/28/2023.  Not surprising, was the fact that he did have a thrombus.  The thrombus was in the right femoral and right popliteal veins.  Also noted was thrombus in the posterior tibial and peroneal veins.  He now is on Lovenox.  He is doing okay.  This still may be a little bit of swelling in the leg.  He did have a PET scan done on 05/29/2023.  The PET scan did not show any obvious active Hodgkin's disease.Marland Kitchen  He is complaining of a lot of lower back pain.  He has had lower back pain in the past.  He has had back surgery.  I told him that he really needs to see his surgeon who operated on him.  I will make sure he wants to go back to [facility] for an evaluation.  I told him that we could always get him to see a spinal surgeon here.  He will think about this.  His Parkinson's continues to be a problem.  He now is on a new medicine (Comtan) that helps maintain the blood levels of the Sinemet.  He actually is going to go out to [facility]  to be evaluated maybe have some kind of therapy out there.  He is very  worried that his prescriptions will not be taken care of out there.  I reassured him that if they will take him as a patient, they will take his prescriptions.    I think is trying to exercise a little bit more.  I applaud him for doing that.  He has had no problems with fever.  There is been no cough.  He has had some episodes of falling.  Thankfully, he has not broken anything or had any bleeding.  Currently, I would have to say that his performance status is probably ECOG 2.   Medications:  Current Outpatient Medications:    carbidopa-levodopa (SINEMET CR) 50-200 MG tablet, TAKE 1 TABLET BY MOUTH AT BEDTIME, Disp: 90 tablet, Rfl: 0   carbidopa-levodopa (SINEMET IR) 25-100 MG tablet, TAKE 2 TABLETS AT 7, 2 TABLETS AT 11, 2 TABLETS AT 3 PM, AND 2 TABLETS AT 7 PM, Disp: 720 tablet, Rfl: 0   cholecalciferol (VITAMIN D3) 25 MCG (1000 UNIT) tablet, Take 1,000 Units by mouth daily., Disp: , Rfl:    diphenhydramine-acetaminophen (TYLENOL PM) 25-500 MG TABS tablet, Take 1 tablet by mouth at bedtime as needed., Disp: , Rfl:    entacapone (COMTAN) 200 MG tablet, Take 1 tablet (200 mg total) by mouth  3 (three) times daily. With first 3 dosages of levodopa, Disp: 270 tablet, Rfl: 1   LORazepam (ATIVAN) 0.5 MG tablet, Take 1 tablet (0.5 mg total) by mouth every 6 (six) hours as needed for anxiety., Disp: 20 tablet, Rfl: 0   pramipexole (MIRAPEX) 0.5 MG tablet, TAKE 1 TABLET(0.5 MG) BY MOUTH THREE TIMES DAILY, Disp: 270 tablet, Rfl: 0   rivaroxaban (XARELTO) 20 MG TABS tablet, Take 1 tablet (20 mg total) by mouth daily with supper., Disp: 30 tablet, Rfl: 3 No current facility-administered medications for this visit.  Facility-Administered Medications Ordered in Other Visits:    sodium chloride flush (NS) 0.9 % injection 10 mL, 10 mL, Intracatheter, PRN, [name], MD, 10 mL at 03/19/23 1338  Allergies: No Known Allergies  Past Medical History, Surgical history, Social history, and Family History  were reviewed and updated.  Review of Systems: Review of Systems  Neurological:  Positive for tremors.  All other systems reviewed and are negative.    Physical Exam:  height is 5\' 10"  (1.778 m) and weight is 175 lb (79.4 kg). His oral temperature is 98.3 F (36.8 C). His blood pressure is 126/79 and his pulse is 75. His respiration is 18 and oxygen saturation is 98%.   Physical Exam Vitals reviewed.  HENT:     Head: Normocephalic and atraumatic.  Eyes:     Pupils: Pupils are equal, round, and reactive to light.  Cardiovascular:     Rate and Rhythm: Normal rate and regular rhythm.     Heart sounds: Normal heart sounds.  Pulmonary:     Effort: Pulmonary effort is normal.     Breath sounds: Normal breath sounds.  Abdominal:     General: Bowel sounds are normal.     Palpations: Abdomen is soft.  Musculoskeletal:        General: No tenderness or deformity. Normal range of motion.     Cervical back: Normal range of motion.     Comments: There may be a little bit of swelling in the right lower leg.  I cannot palpate any obvious venous cord.  There may be a questionable Homans' sign.  Lymphadenopathy:     Cervical: No cervical adenopathy.  Skin:    General: Skin is warm and dry.     Findings: No erythema or rash.  Neurological:     Mental Status: He is alert and oriented to person, place, and time.  Psychiatric:        Behavior: Behavior normal.        Thought Content: Thought content normal.        Judgment: Judgment normal.    Lab Results  Component Value Date   WBC 5.2 07/11/2023   HGB 10.9 (L) 07/11/2023   HCT 34.3 (L) 07/11/2023   MCV 97.4 07/11/2023   PLT 256 07/11/2023     Chemistry      Component Value Date/Time   NA 140 05/28/2023 0930   NA 142 04/02/2017 0000   NA 140 07/19/2016 1305   K 4.2 05/28/2023 0930   K 4.4 07/19/2016 1305   CL 106 05/28/2023 0930   CL 105 02/08/2016 1117   CL 107 03/25/2013 0828   CO2 28 05/28/2023 0930   CO2 26 07/19/2016  1305   BUN 30 (H) 05/28/2023 0930   BUN 21 04/02/2017 0000   BUN 22.8 07/19/2016 1305   CREATININE 1.17 05/28/2023 0930   CREATININE 1.1 07/19/2016 1305   GLU 105 04/02/2017 0000  Component Value Date/Time   CALCIUM 9.4 05/28/2023 0930   CALCIUM 9.8 07/19/2016 1305   ALKPHOS 68 05/28/2023 0930   ALKPHOS 76 07/19/2016 1305   AST 9 (L) 05/28/2023 0930   AST 18 07/19/2016 1305   ALT <5 05/28/2023 0930   ALT 14 07/19/2016 1305   BILITOT 0.4 05/28/2023 0930   BILITOT 0.70 07/19/2016 1305      Impression and Plan: Brian [name] is a 62 year old gentleman.  He has history of recurrent Hodgkin's disease. 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 He then had a recurrence after this.  He did undergo CAR-T therapy at [facility].  I believe he had this in April 2017.  He did well with this.  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  This was proven by biopsy.  He had radiation therapy for this.  He subsequently has had another recurrence.  This was in the right axilla.  This was biopsy-proven.  He did undergo radiation therapy for this.  This was completed in September/2023.  Now, we have had another recurrence.  He completed 6 cycles of chemotherapy with ANVD in May.  So far, the PET scan that he had done back in July did not show any evidence of recurrence.  He will need to be at least on anticoagulation for 6 months.  I will check another Doppler in about a month or so.  He does not need another PET scan probably until November.  Again, I truly believe that his Parkinson's will certainly be a determinant of his quality of life.     [name], MD 8/15/20249:55 AM

## 2023-07-15 ENCOUNTER — Ambulatory Visit: Payer: Commercial Managed Care - PPO | Admitting: Family Medicine

## 2023-07-15 ENCOUNTER — Encounter: Payer: Self-pay | Admitting: Family Medicine

## 2023-07-15 VITALS — BP 122/74 | Ht 70.0 in | Wt 175.0 lb

## 2023-07-15 DIAGNOSIS — M25511 Pain in right shoulder: Secondary | ICD-10-CM | POA: Diagnosis not present

## 2023-07-15 NOTE — Patient Instructions (Signed)
You completed the tear of your proximal biceps tendon and also strained your rotator cuff. Do home exercises as directed most days of the week x 6 weeks. Tylenol, aleve if needed. Ice or heat (whichever feels better at this point) 15 minutes at a time 3-4 times a day. Use the sling rarely. Consider physical therapy if you're struggling. Follow up with [place] in 5-6 weeks for reevaluation.

## 2023-07-15 NOTE — Progress Notes (Addendum)
PCP: Copland, Gwenlyn Found, MD  Subjective:   HPI: Patient is a 62 y.o. male here for right shoulder pain.  In November 2022 patient sustained right shoulder injury while falling through doorway and trying to catch himself.  He sustained closed fracture of right proximal humerus as well as traumatic complete tear of right supraspinatus.  MRI of shoulder 1 year ago showed severe tendinosis/large near complete full-thickness tear of supraspinatus, tendinosis of the infraspinatus tendon with large full-thickness tear as well as mild tendinosis of the intra-articular portion of the long head of the biceps tendon.  He did not pursue surgical correction of rotator cuff tears.  He reports chronically reduced function compared to left shoulder, however was still able to meet his ADLs.  Of note patient has lymphoma as well as Parkinson's disease and was not interested in seeking surgical interventions at that time.  Patient was in his baseline state of health until 2 weeks ago when he fell forward onto outstretched arms in a theater.  He did not seek medical care at that time.  Since that fall he has had worsening right shoulder pain, but does not have increased limitation from baseline.  He denies any bruising.  He has been wearing a sling intermittently for pain and using heat.  He saw PCP who ordered x-ray, which was without new fractures nor dislocations.  His primary concern is understanding the rehabilitation process for his shoulder injury.   Past Medical History:  Diagnosis Date   Anxiety    Dysrhythmia    past hx pvc   Goals of care, counseling/discussion 09/25/2018   H/O autologous stem cell transplant (HCC)    may 2014  at [facility]   History of Bell's palsy    2009  RIGHT SIDE--  HAS 80% FUNCTION / PT STATES A LITTLE ASYMETRICAL AND EFFECTS MOUTH   History of radiation therapy 10/18/17-11/05/17   sprine T1 26 Gy in 13 fractions, spine boost 10 Gy in 5 fractions   History of radiation therapy     Right hip, Rt Sclav- 07/23/22-08/07/11- Dr. 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   Mass of right chest wall 09/18/2018   Mass of right inguinal region    Neuromuscular disorder (HCC)    bilateral feet neuropathy   PVC (premature ventricular contraction)    "benign"   TIA (transient ischemic attack) 02/2015   "probable TIA"   Wears contact lenses     Current Outpatient Medications on File Prior to Visit  Medication Sig Dispense Refill   carbidopa-levodopa (SINEMET CR) 50-200 MG tablet TAKE 1 TABLET BY MOUTH AT BEDTIME 90 tablet 0   carbidopa-levodopa (SINEMET IR) 25-100 MG tablet TAKE 2 TABLETS AT 7, 2 TABLETS AT 11, 2 TABLETS AT 3 PM, AND 2 TABLETS AT 7 PM 720 tablet 0   cholecalciferol (VITAMIN D3) 25 MCG (1000 UNIT) tablet Take 1,000 Units by mouth daily.     diphenhydramine-acetaminophen (TYLENOL PM) 25-500 MG TABS tablet Take 1 tablet by mouth at bedtime as needed.     entacapone (COMTAN) 200 MG tablet Take 1 tablet (200 mg total) by mouth 3 (three) times daily. With first 3 dosages of levodopa 270 tablet 1   LORazepam (ATIVAN) 0.5 MG tablet Take 1 tablet (0.5 mg total)  by mouth every 6 (six) hours as needed for anxiety. 20 tablet 0   pramipexole (MIRAPEX) 0.5 MG tablet TAKE 1 TABLET(0.5 MG) BY MOUTH THREE TIMES DAILY 270 tablet 0   rivaroxaban (XARELTO) 20 MG TABS tablet Take 1 tablet (20 mg total) by mouth daily with supper. 30 tablet 3   Current Facility-Administered Medications on File Prior to Visit  Medication Dose Route Frequency Provider Last Rate Last Admin   sodium chloride flush (NS) 0.9 % injection 10 mL  10 mL Intracatheter PRN [name], MD   10 mL at 03/19/23 1338    Past Surgical History:  Procedure Laterality Date   AXILLARY LYMPH NODE BIOPSY Left  02/02/2013   Procedure: NEEDLE LOCALIZED AXILLARY LYMPH NODE BIOPSY;  Surgeon: [name], MD;  Location: [facility];  Service: General;  Laterality: Left;   BACK SURGERY  03/2021   BONE MARROW BIOPSY  08/26/2012   COLONOSCOPY  2021   CYST REMOVAL NECK Left 11/26/1978   IR IMAGING GUIDED PORT INSERTION  10/23/2022   LEFT INGUINAL LYMPH NODE BX  09/09/2012   LYMPH NODE BIOPSY N/A 07/28/2015   Procedure: LYMPH NODE BIOPSY;  Surgeon: Loreli Slot, MD;  Location: [facility] OR;  Service: Thoracic;  Laterality: N/A;   LYMPH NODE BIOPSY Right 06/29/2022   Procedure: RIGHT AXILLARY NODE BIOPSY;  Surgeon: Loreli Slot, MD;  Location: [facility] OR;  Service: Vascular;  Laterality: Right;   MASS EXCISION Right 09/19/2018   Procedure: EXCISION OF RIGHT CHEST WALL MASS ERAS PATHWAY;  Surgeon: Claud Kelp, MD;  Location: [facility];  Service: General;  Laterality: Right;   NODE DISSECTION Right 07/28/2015   Procedure: NODE DISSECTION;  Surgeon: Loreli Slot, MD;  Location: [facility] OR;  Service: Thoracic;  Laterality: Right;   PLEURA BIOPSY Left 05/31/2014   REMOVAL RIGHT INGUINAL LYMPH NODES  08/16/2011   SCROTAL EXPLORATION Right 01/04/2014   Procedure: SCROTUM EXPLORATION   INGUINAL , EXCISION OF CYSTIC MASS OF RIGHT SPERMATIC CORD, WITH FROZEN SECTION;  Surgeon: Marcine Matar, MD;  Location: [facility];  Service: Urology;  Laterality: Right;   TRANSTHORACIC ECHOCARDIOGRAM  03/18/2013   MILD LVH/  EF 55-60%   VIDEO ASSISTED THORACOSCOPY Left 05/31/2014   Procedure: LEFT VIDEO ASSISTED THORACOSCOPY, PLEURAL BIOPSY;  Surgeon: Loreli Slot, MD;  Location: [facility] OR;  Service: Thoracic;  Laterality: Left;   VIDEO ASSISTED THORACOSCOPY Right 07/28/2015   Procedure: RIGHT VIDEO ASSISTED THORACOSCOPY;  Surgeon: Loreli Slot, MD;  Location: [facility] OR;  Service: Thoracic;  Laterality: Right;   VIDEO ASSISTED THORACOSCOPY (VATS)/ LYMPH  NODE SAMPLING Right 07/28/2015   VIDEO ASSISTED THORACOSCOPY (VATS)/WEDGE RESECTION  05/31/2014   VIDEO BRONCHOSCOPY WITH ENDOBRONCHIAL ULTRASOUND  07/28/2015   VIDEO BRONCHOSCOPY WITH ENDOBRONCHIAL ULTRASOUND N/A 07/28/2015   Procedure: VIDEO BRONCHOSCOPY WITH ENDOBRONCHIAL ULTRASOUND;  Surgeon: Loreli Slot, MD;  Location: [facility] OR;  Service: Thoracic;  Laterality: N/A;    No Known Allergies  BP 122/74   Ht 5\' 10"  (1.778 m)   Wt 175 lb (79.4 kg)   BMI 25.11 kg/m       No data to display              No data to display              Objective:  Physical Exam:  Gen: NAD, comfortable in exam room  Right shoulder: No gross deformity of shoulder, Popeye biceps deformity noted on exam (patient  did not notice prior), no ecchymosis, no swelling.  No TTP.  Limited abduction compared to left, slight limitation in flexion/external rotation. 5/5 strength and normal sensation. Yergason negative   Assessment & Plan:  1.  Right shoulder pain: 2/2 acute on chronic rotator cuff injury.  Additionally, found to have biceps tendon tear however unclear exact date of rupture (MRI 1 year ago showed some tendinosis, but not complete tear) and unclear if contributory.  Due to surprisingly good strength/decent ROM/ chronicity of rotator tears, do not feel further imaging is necessary at this time.  Home exercises demonstrated today.  Recommend OTC NSAIDs and ice/heat.  Only use sling if pain is unbearable.  Follow-up in 4-6 weeks, consider formal physical therapy if not improving.

## 2023-07-23 ENCOUNTER — Telehealth: Payer: Self-pay | Admitting: Neurology

## 2023-07-23 NOTE — Telephone Encounter (Signed)
Caller stated she would like to speak with doctor or nurse

## 2023-07-23 NOTE — Telephone Encounter (Signed)
Called patients girlfriend back and she wanted an explanation on several questions she had with our office policies as well as seeing the doctor every 3 months vs 6 months

## 2023-08-22 ENCOUNTER — Other Ambulatory Visit: Payer: Self-pay | Admitting: Neurology

## 2023-08-22 DIAGNOSIS — G20A1 Parkinson's disease without dyskinesia, without mention of fluctuations: Secondary | ICD-10-CM

## 2023-08-28 ENCOUNTER — Inpatient Hospital Stay (HOSPITAL_BASED_OUTPATIENT_CLINIC_OR_DEPARTMENT_OTHER): Payer: Commercial Managed Care - PPO | Admitting: Hematology & Oncology

## 2023-08-28 ENCOUNTER — Inpatient Hospital Stay: Payer: Commercial Managed Care - PPO

## 2023-08-28 ENCOUNTER — Encounter: Payer: Self-pay | Admitting: Hematology & Oncology

## 2023-08-28 ENCOUNTER — Inpatient Hospital Stay: Payer: Commercial Managed Care - PPO | Attending: Hematology & Oncology

## 2023-08-28 ENCOUNTER — Ambulatory Visit (HOSPITAL_BASED_OUTPATIENT_CLINIC_OR_DEPARTMENT_OTHER): Payer: Commercial Managed Care - PPO

## 2023-08-28 ENCOUNTER — Ambulatory Visit (HOSPITAL_BASED_OUTPATIENT_CLINIC_OR_DEPARTMENT_OTHER)
Admission: RE | Admit: 2023-08-28 | Discharge: 2023-08-28 | Disposition: A | Discharge status: Home / Self Care | Payer: Commercial Managed Care - PPO | Source: Ambulatory Visit | Attending: Hematology & Oncology | Admitting: Hematology & Oncology

## 2023-08-28 VITALS — BP 106/64 | HR 83 | Temp 97.5°F | Resp 20 | Ht 70.0 in | Wt 176.0 lb

## 2023-08-28 DIAGNOSIS — Z8571 Personal history of Hodgkin lymphoma: Secondary | ICD-10-CM | POA: Insufficient documentation

## 2023-08-28 DIAGNOSIS — I824Y1 Acute embolism and thrombosis of unspecified deep veins of right proximal lower extremity: Secondary | ICD-10-CM

## 2023-08-28 DIAGNOSIS — C8195 Hodgkin lymphoma, unspecified, lymph nodes of inguinal region and lower limb: Secondary | ICD-10-CM

## 2023-08-28 DIAGNOSIS — K909 Intestinal malabsorption, unspecified: Secondary | ICD-10-CM | POA: Insufficient documentation

## 2023-08-28 DIAGNOSIS — Z7901 Long term (current) use of anticoagulants: Secondary | ICD-10-CM | POA: Insufficient documentation

## 2023-08-28 DIAGNOSIS — D509 Iron deficiency anemia, unspecified: Secondary | ICD-10-CM | POA: Insufficient documentation

## 2023-08-28 DIAGNOSIS — Z86718 Personal history of other venous thrombosis and embolism: Secondary | ICD-10-CM | POA: Insufficient documentation

## 2023-08-28 LAB — CMP (CANCER CENTER ONLY)
ALT: <5 U/L (ref 0–44)
AST: 13 U/L — ABNORMAL LOW (ref 15–41)
Albumin: 4.1 g/dL (ref 3.5–5.0)
Alkaline Phosphatase: 86 U/L (ref 38–126)
Anion gap: 6 (ref 5–15)
BUN: 42 mg/dL — ABNORMAL HIGH (ref 8–23)
CO2: 27 mmol/L (ref 22–32)
Calcium: 9 mg/dL (ref 8.9–10.3)
Chloride: 105 mmol/L (ref 98–111)
Creatinine: 1.51 mg/dL — ABNORMAL HIGH (ref 0.61–1.24)
GFR, Estimated: 52 mL/min — ABNORMAL LOW (ref >60)
Glucose, Bld: 87 mg/dL (ref 70–99)
Potassium: 4.6 mmol/L (ref 3.5–5.1)
Sodium: 138 mmol/L (ref 135–145)
Total Bilirubin: 0.4 mg/dL (ref 0.3–1.2)
Total Protein: 6.2 g/dL — ABNORMAL LOW (ref 6.5–8.1)

## 2023-08-28 LAB — CBC WITH DIFFERENTIAL (CANCER CENTER ONLY)
Abs Immature Granulocytes: 0.02 10*3/uL (ref 0.00–0.07)
Basophils Absolute: 0.1 10*3/uL (ref 0.0–0.1)
Basophils Relative: 1 %
Eosinophils Absolute: 0.2 10*3/uL (ref 0.0–0.5)
Eosinophils Relative: 4 %
HCT: 34.8 % — ABNORMAL LOW (ref 39.0–52.0)
Hemoglobin: 11.1 g/dL — ABNORMAL LOW (ref 13.0–17.0)
Immature Granulocytes: 0 %
Lymphocytes Relative: 13 %
Lymphs Abs: 0.7 10*3/uL (ref 0.7–4.0)
MCH: 30.7 pg (ref 26.0–34.0)
MCHC: 31.9 g/dL (ref 30.0–36.0)
MCV: 96.4 fL (ref 80.0–100.0)
Monocytes Absolute: 0.6 10*3/uL (ref 0.1–1.0)
Monocytes Relative: 12 %
Neutro Abs: 3.6 10*3/uL (ref 1.7–7.7)
Neutrophils Relative %: 70 %
Platelet Count: 239 10*3/uL (ref 150–400)
RBC: 3.61 MIL/uL — ABNORMAL LOW (ref 4.22–5.81)
RDW: 14 % (ref 11.5–15.5)
WBC Count: 5.2 10*3/uL (ref 4.0–10.5)
nRBC: 0 % (ref 0.0–0.2)

## 2023-08-28 LAB — IRON AND IRON BINDING CAPACITY (CC-WL,HP ONLY)
Iron: 74 ug/dL (ref 45–182)
Saturation Ratios: 20 % (ref 17.9–39.5)
TIBC: 363 ug/dL (ref 250–450)
UIBC: 289 ug/dL (ref 117–376)

## 2023-08-28 LAB — FERRITIN: Ferritin: 83 ng/mL (ref 24–336)

## 2023-08-28 MED ORDER — HEPARIN SOD (PORK) LOCK FLUSH 100 UNIT/ML IV SOLN
500.0000 [IU] | Freq: Once | INTRAVENOUS | Status: AC
  Administered 2023-08-28: 500 [IU] via INTRAVENOUS

## 2023-08-28 MED ORDER — SODIUM CHLORIDE 0.9% FLUSH
10.0000 mL | Freq: Once | INTRAVENOUS | Status: AC
  Administered 2023-08-28: 10 mL

## 2023-08-28 NOTE — Patient Instructions (Signed)

## 2023-08-28 NOTE — Progress Notes (Signed)
Hematology and Oncology Follow Up Visit  Brian [name] [id number] 24-Sep-1961 62 y.o. 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 daily -start on 05/29/2023      Interim History:  [name] is in for follow-up.  He seems to be doing pretty well.  He is on Xarelto for the thromboembolism in the right leg.  He is post have a Doppler done later on today.  He has occasional pain in the leg.  He has had this for a while.  I think this might be more so from his back there from the actual thrombus.  He, otherwise, seems to be managing pretty well.  So far, there is been no evidence of recurrence of his Hodgkin's disease.  He is probably due for a another PET scan in November.  He still has the back issues.  I think this will always be a problem for him.  He has had no bleeding.  He has had no fever.  He has had no headache.  There has been no rashes.  He has had no leg swelling.  He has had no cough or shortness of breath.  He has had no problems with COVID.  Overall, I would say that his performance status is ECOG 1.    Medications:  Current Outpatient Medications:    carbidopa-levodopa (SINEMET CR) 50-200 MG tablet, TAKE 1 TABLET BY MOUTH AT BEDTIME, Disp: 90 tablet, Rfl: 0   carbidopa-levodopa (SINEMET IR) 25-100 MG tablet, TAKE 2 TABLETS AT 7, 2 TABLETS AT 11, 2 TABLETS AT 3 PM, AND 2 TABLETS AT 7 PM, Disp: 720 tablet, Rfl: 0   cholecalciferol (VITAMIN D3) 25 MCG (1000 UNIT) tablet, Take 1,000 Units by mouth daily., Disp: , Rfl:    diphenhydramine-acetaminophen  (TYLENOL PM) 25-500 MG TABS tablet, Take 1 tablet by mouth at bedtime as needed., Disp: , Rfl:    entacapone (COMTAN) 200 MG tablet, Take 1 tablet (200 mg total) by mouth 3 (three) times daily. With first 3 dosages of levodopa, Disp: 270 tablet, Rfl: 1   LORazepam (ATIVAN) 0.5 MG tablet, Take 1 tablet (0.5 mg total) by mouth every 6 (six) hours as needed for anxiety., Disp: 20 tablet, Rfl: 0   pramipexole (MIRAPEX) 0.5 MG tablet, TAKE 1 TABLET(0.5 MG) BY MOUTH THREE TIMES DAILY, Disp: 270 tablet, Rfl: 0   rivaroxaban (XARELTO) 20 MG TABS tablet, Take 1 tablet (20 mg total) by mouth daily with supper., Disp: 30 tablet, Rfl: 3 No current facility-administered medications for this visit.  Facility-Administered Medications Ordered in Other Visits:    sodium chloride flush (NS) 0.9 % injection 10 mL, 10 mL, Intracatheter, PRN, [name], MD, 10 mL at 03/19/23 1338  Allergies: No Known Allergies  Past Medical History, Surgical history, Social history, and Family History were reviewed and updated.  Review of Systems: Review of Systems  Neurological:  Positive for tremors.  All other systems reviewed and are negative.    Physical Exam:  height is 5\' 10"  (1.778 m) and weight is 176 lb (79.8 kg). His oral temperature is  97.5 F (36.4 C) (abnormal). His blood pressure is 106/64 and his pulse is 83. His respiration is 20 and oxygen saturation is 96%.   Physical Exam Vitals reviewed.  HENT:     Head: Normocephalic and atraumatic.  Eyes:     Pupils: Pupils are equal, round, and reactive to light.  Cardiovascular:     Rate and Rhythm: Normal rate and regular rhythm.     Heart sounds: Normal heart sounds.  Pulmonary:     Effort: Pulmonary effort is normal.     Breath sounds: Normal breath sounds.  Abdominal:     General: Bowel sounds are normal.     Palpations: Abdomen is soft.  Musculoskeletal:        General: No tenderness or deformity. Normal range of motion.     Cervical back:  Normal range of motion.     Comments: There may be a little bit of swelling in the right lower leg.  I cannot palpate any obvious venous cord.  There may be a questionable Homans' sign.  Lymphadenopathy:     Cervical: No cervical adenopathy.  Skin:    General: Skin is warm and dry.     Findings: No erythema or rash.  Neurological:     Mental Status: He is alert and oriented to person, place, and time.  Psychiatric:        Behavior: Behavior normal.        Thought Content: Thought content normal.        Judgment: Judgment normal.     Lab Results  Component Value Date   WBC 5.2 08/28/2023   HGB 11.1 (L) 08/28/2023   HCT 34.8 (L) 08/28/2023   MCV 96.4 08/28/2023   PLT 239 08/28/2023     Chemistry      Component Value Date/Time   NA 138 07/11/2023 0920   NA 142 04/02/2017 0000   NA 140 07/19/2016 1305   K 4.6 07/11/2023 0920   K 4.4 07/19/2016 1305   CL 104 07/11/2023 0920   CL 105 02/08/2016 1117   CL 107 03/25/2013 0828   CO2 27 07/11/2023 0920   CO2 26 07/19/2016 1305   BUN 50 (H) 07/11/2023 0920   BUN 21 04/02/2017 0000   BUN 22.8 07/19/2016 1305   CREATININE 1.59 (H) 07/11/2023 0920   CREATININE 1.1 07/19/2016 1305   GLU 105 04/02/2017 0000      Component Value Date/Time   CALCIUM 9.5 07/11/2023 0920   CALCIUM 9.8 07/19/2016 1305   ALKPHOS 79 07/11/2023 0920   ALKPHOS 76 07/19/2016 1305   AST 12 (L) 07/11/2023 0920   AST 18 07/19/2016 1305   ALT <5 07/11/2023 0920   ALT 14 07/19/2016 1305   BILITOT 0.4 07/11/2023 0920   BILITOT 0.70 07/19/2016 1305      Impression and Plan: Brian [name] is a 62 year old gentleman.  He has history of recurrent Hodgkin's disease. 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 He then had a recurrence after this.  He did undergo CAR[facility]T therapy at [facility].  I believe he had this in April 2017.  He did well with this.  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  This was proven by biopsy.  He had  radiation therapy for this.  He subsequently has had another recurrence.  This was in the right axilla.  This was biopsy-proven.  He did undergo radiation therapy for this.  This was completed in September/2023.  Now, we have had another recurrence.  He completed 6 cycles of chemotherapy with ANVD in May.  So far, the PET scan that he had done back in July did not show any evidence of recurrence.  For right now, we will have to see how the Doppler looks.  Again, hopefully, the thrombus is resolving.  He is going need to be on Xarelto at a therapeutic dose for at least a year.  We will set him up with a PET scan in November.  I will try to get him back after Thanksgiving.      [name], MD 10/2/202412:48 PM

## 2023-08-29 ENCOUNTER — Ambulatory Visit: Payer: Commercial Managed Care - PPO | Admitting: Family Medicine

## 2023-09-02 ENCOUNTER — Other Ambulatory Visit: Payer: Self-pay | Admitting: Neurology

## 2023-09-02 DIAGNOSIS — G20A1 Parkinson's disease without dyskinesia, without mention of fluctuations: Secondary | ICD-10-CM

## 2023-09-09 ENCOUNTER — Other Ambulatory Visit: Payer: Self-pay | Admitting: Neurology

## 2023-09-11 ENCOUNTER — Telehealth: Payer: Self-pay | Admitting: Family Medicine

## 2023-09-11 NOTE — Telephone Encounter (Signed)
Patient called to request a call back from provider to discuss a question about medication. Pt did not want to relay message to me. [phone]

## 2023-09-12 NOTE — Telephone Encounter (Signed)
Called him back-he wants to talk about starting gabapentin, have not seen him in about a year.  Schedule an appointment for Monday

## 2023-09-15 NOTE — Progress Notes (Unsigned)
[facility] [address], [address] [place], [place] [address] [phone]  Date:  09/16/2023   Name:  Brian [name]   DOB:  [birth year]   MRN:  [id number]  PCP:  Pearline Cables, MD    Chief Complaint: No chief complaint on file.   History of Present Illness:  Brian [name] is a 62 y.o. very pleasant male patient who presents with the following:  Patient seen today for concern of back pain Most recent visit with myself was about 1 year ago, November 2023 He has history of Hodgkin's disease, Parkinson's, hypertension From Hodgkin's disease standpoint he had a stem cell transplant in 2014, then had a recurrence.  He had CAR-T therapy at [facility] in 2017 and did well until another relapse in his spine, subsequent radiation treatment.  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   Primary hypertension 09/12/2022   Closed fracture of right proximal humerus 10/03/2021   Traumatic complete tear of right rotator cuff 10/03/2021   Lumbar  radiculopathy 10/03/2021   Goals of care, counseling/discussion 09/25/2018   Mass of right chest wall 09/18/2018   PD (Parkinson's disease) (HCC) 11/27/2016   Lymphadenopathy 07/28/2015   Mediastinal adenopathy 07/14/2015   Aphasia 03/04/2015   Slurred speech 03/04/2015   Pleural mass 05/31/2014   Abdominal pain, epigastric 02/02/2014   Weight loss 08/12/2012   H/O Bell's palsy 08/12/2012   Hodgkin's disease 08/09/2011    Past Medical History:  Diagnosis Date   Anxiety    Dysrhythmia    past hx pvc   Goals of care, counseling/discussion 09/25/2018   H/O autologous stem cell transplant (HCC)    may 2014  at [facility]   History of Bell's palsy    2009  RIGHT SIDE--  HAS 80% FUNCTION / PT STATES A LITTLE ASYMETRICAL AND EFFECTS MOUTH   History of radiation therapy 10/18/17-11/05/17   sprine T1 26 Gy in 13 fractions, spine boost 10 Gy in 5 fractions   History of radiation therapy    Right hip, Rt Sclav- 07/23/22-08/07/11- Dr. 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   Mass of right chest wall 09/18/2018   Mass of right inguinal region    Neuromuscular disorder (HCC)    bilateral feet neuropathy   PVC (premature ventricular contraction)    "  benign"   TIA (transient ischemic attack) 02/2015   "probable TIA"   Wears contact lenses     Past Surgical History:  Procedure Laterality Date   AXILLARY LYMPH NODE BIOPSY Left 02/02/2013   Procedure: NEEDLE LOCALIZED AXILLARY LYMPH NODE BIOPSY;  Surgeon: [name], MD;  Location: [facility];  Service: General;  Laterality: Left;   BACK SURGERY  03/2021   BONE MARROW BIOPSY  08/26/2012   COLONOSCOPY  2021   CYST REMOVAL NECK Left 11/26/1978   IR IMAGING GUIDED PORT INSERTION  10/23/2022   LEFT INGUINAL LYMPH  NODE BX  09/09/2012   LYMPH NODE BIOPSY N/A 07/28/2015   Procedure: LYMPH NODE BIOPSY;  Surgeon: Loreli Slot, MD;  Location: [facility] OR;  Service: Thoracic;  Laterality: N/A;   LYMPH NODE BIOPSY Right 06/29/2022   Procedure: RIGHT AXILLARY NODE BIOPSY;  Surgeon: Loreli Slot, MD;  Location: [facility] OR;  Service: Vascular;  Laterality: Right;   MASS EXCISION Right 09/19/2018   Procedure: EXCISION OF RIGHT CHEST WALL MASS ERAS PATHWAY;  Surgeon: Claud Kelp, MD;  Location: [facility];  Service: General;  Laterality: Right;   NODE DISSECTION Right 07/28/2015   Procedure: NODE DISSECTION;  Surgeon: Loreli Slot, MD;  Location: [facility] OR;  Service: Thoracic;  Laterality: Right;   PLEURA BIOPSY Left 05/31/2014   REMOVAL RIGHT INGUINAL LYMPH NODES  08/16/2011   SCROTAL EXPLORATION Right 01/04/2014   Procedure: SCROTUM EXPLORATION   INGUINAL , EXCISION OF CYSTIC MASS OF RIGHT SPERMATIC CORD, WITH FROZEN SECTION;  Surgeon: Marcine Matar, MD;  Location: [facility];  Service: Urology;  Laterality: Right;   TRANSTHORACIC ECHOCARDIOGRAM  03/18/2013   MILD LVH/  EF 55-60%   VIDEO ASSISTED THORACOSCOPY Left 05/31/2014   Procedure: LEFT VIDEO ASSISTED THORACOSCOPY, PLEURAL BIOPSY;  Surgeon: Loreli Slot, MD;  Location: [facility] OR;  Service: Thoracic;  Laterality: Left;   VIDEO ASSISTED THORACOSCOPY Right 07/28/2015   Procedure: RIGHT VIDEO ASSISTED THORACOSCOPY;  Surgeon: Loreli Slot, MD;  Location: [facility] OR;  Service: Thoracic;  Laterality: Right;   VIDEO ASSISTED THORACOSCOPY (VATS)/ LYMPH NODE SAMPLING Right 07/28/2015   VIDEO ASSISTED THORACOSCOPY (VATS)/WEDGE RESECTION  05/31/2014   VIDEO BRONCHOSCOPY WITH ENDOBRONCHIAL ULTRASOUND  07/28/2015   VIDEO BRONCHOSCOPY WITH ENDOBRONCHIAL ULTRASOUND N/A 07/28/2015   Procedure: VIDEO BRONCHOSCOPY WITH ENDOBRONCHIAL ULTRASOUND;  Surgeon: Loreli Slot, MD;  Location: [facility] OR;  Service: Thoracic;   Laterality: N/A;    Social History   Tobacco Use   Smoking status: Never   Smokeless tobacco: Never  Vaping Use   Vaping status: Never Used  Substance Use Topics   Alcohol use: Yes    Alcohol/week: 14.0 standard drinks of alcohol    Types: 7 Glasses of wine, 7 Cans of beer per week    Comment: one drink per night   Drug use: No    Family History  Problem Relation Age of Onset   Cancer Mother 87       ovarian   Ovarian cancer Mother 52   Heart disease Father    Stroke Father    Cancer Brother        testicular   Hypertension Brother    Healthy Son    Healthy Son    Hypertension Brother    Other Brother        CMT   Hypertension Brother     No Known Allergies  Medication list has been reviewed and updated.  Current Outpatient Medications on File Prior to Visit  Medication Sig Dispense Refill   carbidopa-levodopa (SINEMET CR) 50-200 MG tablet TAKE 1 TABLET BY MOUTH AT BEDTIME 90 tablet 0   carbidopa-levodopa (SINEMET IR) 25-100 MG tablet TAKE 2 TABLETS BY MOUTH AT 7AM, 2 TABLETS AT 11AM, 2 TABLETS AT 3PM AND 2 TABLETS AT 7PM 720 tablet 0   cholecalciferol (VITAMIN D3) 25 MCG (1000 UNIT) tablet Take 1,000 Units by mouth daily.     diphenhydramine-acetaminophen (TYLENOL PM) 25-500 MG TABS tablet Take 1 tablet by mouth at bedtime as needed.     entacapone (COMTAN) 200 MG tablet Take 1 tablet (200 mg total) by mouth 3 (three) times daily. With first 3 dosages of levodopa 270 tablet 1   LORazepam (ATIVAN) 0.5 MG tablet Take 1 tablet (0.5 mg total) by mouth every 6 (six) hours as needed for anxiety. 20 tablet 0   pramipexole (MIRAPEX) 0.5 MG tablet TAKE 1 TABLET(0.5 MG) BY MOUTH THREE TIMES DAILY 270 tablet 0   rivaroxaban (XARELTO) 20 MG TABS tablet Take 1 tablet (20 mg total) by mouth daily with supper. 30 tablet 3   Current Facility-Administered Medications on File Prior to Visit  Medication Dose Route Frequency Provider Last Rate Last Admin   sodium chloride flush (NS)  0.9 % injection 10 mL  10 mL Intracatheter PRN [name], MD   10 mL at 03/19/23 1338    Review of Systems:  As per HPI- otherwise negative.    Physical Examination: There were no vitals filed for this visit. There were no vitals filed for this visit. There is no height or weight on file to calculate BMI. Ideal Body Weight:    GEN: no acute distress. HEENT: Atraumatic, Normocephalic.  Ears and Nose: No external deformity. CV: RRR, No M/G/R. No JVD. No thrill. No extra heart sounds. PULM: CTA B, no wheezes, crackles, rhonchi. No retractions. No resp. distress. No accessory muscle use. ABD: S, NT, ND, +BS. No rebound. No HSM. EXTR: No c/c/e PSYCH: Normally interactive. Conversant.    Assessment and Plan: ***  Signed Abbe Amsterdam, MD

## 2023-09-16 ENCOUNTER — Ambulatory Visit (INDEPENDENT_AMBULATORY_CARE_PROVIDER_SITE_OTHER): Payer: Commercial Managed Care - PPO | Admitting: Family Medicine

## 2023-09-16 VITALS — BP 110/64 | HR 87 | Temp 97.9°F | Resp 18 | Ht 70.0 in | Wt 179.2 lb

## 2023-09-16 DIAGNOSIS — M79605 Pain in left leg: Secondary | ICD-10-CM

## 2023-09-16 DIAGNOSIS — F419 Anxiety disorder, unspecified: Secondary | ICD-10-CM | POA: Diagnosis not present

## 2023-09-16 DIAGNOSIS — Z131 Encounter for screening for diabetes mellitus: Secondary | ICD-10-CM | POA: Diagnosis not present

## 2023-09-16 DIAGNOSIS — R5383 Other fatigue: Secondary | ICD-10-CM | POA: Diagnosis not present

## 2023-09-16 DIAGNOSIS — Z13220 Encounter for screening for lipoid disorders: Secondary | ICD-10-CM | POA: Diagnosis not present

## 2023-09-16 DIAGNOSIS — Z87898 Personal history of other specified conditions: Secondary | ICD-10-CM

## 2023-09-16 DIAGNOSIS — Z125 Encounter for screening for malignant neoplasm of prostate: Secondary | ICD-10-CM

## 2023-09-16 DIAGNOSIS — M545 Low back pain, unspecified: Secondary | ICD-10-CM | POA: Diagnosis not present

## 2023-09-16 DIAGNOSIS — R7303 Prediabetes: Secondary | ICD-10-CM

## 2023-09-16 MED ORDER — TRAMADOL HCL 50 MG PO TABS
50.0000 mg | ORAL_TABLET | Freq: Three times a day (TID) | ORAL | 1 refills | Status: DC | PRN
Start: 2023-09-16 — End: 2024-03-26

## 2023-09-16 MED ORDER — LORAZEPAM 0.5 MG PO TABS
0.5000 mg | ORAL_TABLET | Freq: Four times a day (QID) | ORAL | 1 refills | Status: DC | PRN
Start: 2023-09-16 — End: 2024-01-30

## 2023-09-16 MED ORDER — TAMSULOSIN HCL 0.4 MG PO CAPS
0.4000 mg | ORAL_CAPSULE | Freq: Every day | ORAL | 3 refills | Status: DC
Start: 2023-09-16 — End: 2024-01-01

## 2023-09-16 NOTE — Patient Instructions (Addendum)
It was good to see you again today, I will be in touch with your labs asap   Try the flomax/ tamsulosin for urinary symptoms,  let me know how this works for you I refilled your lorazpeam for anxiety Tramadol to use as needed for pain- this will make you drowsy. Don't use when you need to drive

## 2023-09-17 ENCOUNTER — Encounter: Payer: Self-pay | Admitting: Family Medicine

## 2023-09-17 DIAGNOSIS — R7303 Prediabetes: Secondary | ICD-10-CM | POA: Insufficient documentation

## 2023-09-17 LAB — LIPID PANEL
Cholesterol: 198 mg/dL (ref 0–200)
HDL: 40.7 mg/dL (ref >39.00)
LDL Cholesterol: 123 mg/dL — ABNORMAL HIGH (ref 0–99)
NonHDL: 157.29
Total CHOL/HDL Ratio: 5
Triglycerides: 173 mg/dL — ABNORMAL HIGH (ref 0.0–149.0)
VLDL: 34.6 mg/dL (ref 0.0–40.0)

## 2023-09-17 LAB — TSH: TSH: 4.82 u[IU]/mL (ref 0.35–5.50)

## 2023-09-17 LAB — PSA: PSA: 2.07 ng/mL (ref 0.10–4.00)

## 2023-09-17 LAB — HEMOGLOBIN A1C: Hgb A1c MFr Bld: 6.2 % (ref 4.6–6.5)

## 2023-09-23 ENCOUNTER — Encounter: Payer: Self-pay | Admitting: Hematology & Oncology

## 2023-09-25 ENCOUNTER — Encounter: Payer: Self-pay | Admitting: Hematology & Oncology

## 2023-10-07 ENCOUNTER — Other Ambulatory Visit: Payer: Self-pay | Admitting: Hematology & Oncology

## 2023-10-21 ENCOUNTER — Ambulatory Visit (HOSPITAL_COMMUNITY)
Admission: RE | Admit: 2023-10-21 | Discharge: 2023-10-21 | Disposition: A | Discharge status: Home / Self Care | Payer: Commercial Managed Care - PPO | Source: Ambulatory Visit | Attending: Hematology & Oncology | Admitting: Hematology & Oncology

## 2023-10-21 DIAGNOSIS — C8195 Hodgkin lymphoma, unspecified, lymph nodes of inguinal region and lower limb: Secondary | ICD-10-CM | POA: Insufficient documentation

## 2023-10-21 LAB — GLUCOSE, CAPILLARY: Glucose-Capillary: 99 mg/dL (ref 70–99)

## 2023-10-21 MED ORDER — FLUDEOXYGLUCOSE F - 18 (FDG) INJECTION
8.9500 | Freq: Once | INTRAVENOUS | Status: AC
  Administered 2023-10-21: 8.72 via INTRAVENOUS

## 2023-10-23 ENCOUNTER — Other Ambulatory Visit: Payer: Self-pay | Admitting: Neurology

## 2023-10-23 DIAGNOSIS — G20A1 Parkinson's disease without dyskinesia, without mention of fluctuations: Secondary | ICD-10-CM

## 2023-10-29 ENCOUNTER — Other Ambulatory Visit: Payer: Self-pay | Admitting: Neurology

## 2023-10-29 DIAGNOSIS — G20A1 Parkinson's disease without dyskinesia, without mention of fluctuations: Secondary | ICD-10-CM

## 2023-10-30 ENCOUNTER — Encounter: Payer: Self-pay | Admitting: Hematology & Oncology

## 2023-10-30 ENCOUNTER — Encounter: Payer: Self-pay | Admitting: *Deleted

## 2023-11-02 ENCOUNTER — Other Ambulatory Visit: Payer: Self-pay | Admitting: Neurology

## 2023-11-02 DIAGNOSIS — G20B2 Parkinson's disease with dyskinesia, with fluctuations: Secondary | ICD-10-CM

## 2023-11-06 ENCOUNTER — Encounter: Payer: Self-pay | Admitting: Hematology & Oncology

## 2023-11-06 ENCOUNTER — Inpatient Hospital Stay: Payer: Commercial Managed Care - PPO

## 2023-11-06 ENCOUNTER — Other Ambulatory Visit: Payer: Commercial Managed Care - PPO | Attending: Hematology & Oncology

## 2023-11-06 ENCOUNTER — Inpatient Hospital Stay (HOSPITAL_BASED_OUTPATIENT_CLINIC_OR_DEPARTMENT_OTHER): Payer: Commercial Managed Care - PPO | Admitting: Hematology & Oncology

## 2023-11-06 VITALS — BP 97/60 | HR 82 | Temp 98.0°F | Resp 18 | Wt 177.2 lb

## 2023-11-06 DIAGNOSIS — G20A1 Parkinson's disease without dyskinesia, without mention of fluctuations: Secondary | ICD-10-CM | POA: Diagnosis not present

## 2023-11-06 DIAGNOSIS — Z9484 Stem cells transplant status: Secondary | ICD-10-CM | POA: Diagnosis not present

## 2023-11-06 DIAGNOSIS — Z7901 Long term (current) use of anticoagulants: Secondary | ICD-10-CM | POA: Insufficient documentation

## 2023-11-06 DIAGNOSIS — I824Y1 Acute embolism and thrombosis of unspecified deep veins of right proximal lower extremity: Secondary | ICD-10-CM

## 2023-11-06 DIAGNOSIS — Z8673 Personal history of transient ischemic attack (TIA), and cerebral infarction without residual deficits: Secondary | ICD-10-CM | POA: Diagnosis not present

## 2023-11-06 DIAGNOSIS — C8195 Hodgkin lymphoma, unspecified, lymph nodes of inguinal region and lower limb: Secondary | ICD-10-CM | POA: Diagnosis present

## 2023-11-06 DIAGNOSIS — D509 Iron deficiency anemia, unspecified: Secondary | ICD-10-CM | POA: Diagnosis not present

## 2023-11-06 DIAGNOSIS — K909 Intestinal malabsorption, unspecified: Secondary | ICD-10-CM | POA: Diagnosis not present

## 2023-11-06 DIAGNOSIS — Z923 Personal history of irradiation: Secondary | ICD-10-CM | POA: Insufficient documentation

## 2023-11-06 LAB — CBC WITH DIFFERENTIAL (CANCER CENTER ONLY)
Abs Immature Granulocytes: 0.09 10*3/uL — ABNORMAL HIGH (ref 0.00–0.07)
Basophils Absolute: 0.1 10*3/uL (ref 0.0–0.1)
Basophils Relative: 1 %
Eosinophils Absolute: 0.2 10*3/uL (ref 0.0–0.5)
Eosinophils Relative: 4 %
HCT: 34.7 % — ABNORMAL LOW (ref 39.0–52.0)
Hemoglobin: 11.2 g/dL — ABNORMAL LOW (ref 13.0–17.0)
Immature Granulocytes: 2 %
Lymphocytes Relative: 11 %
Lymphs Abs: 0.6 10*3/uL — ABNORMAL LOW (ref 0.7–4.0)
MCH: 31.5 pg (ref 26.0–34.0)
MCHC: 32.3 g/dL (ref 30.0–36.0)
MCV: 97.5 fL (ref 80.0–100.0)
Monocytes Absolute: 0.6 10*3/uL (ref 0.1–1.0)
Monocytes Relative: 11 %
Neutro Abs: 4 10*3/uL (ref 1.7–7.7)
Neutrophils Relative %: 71 %
Platelet Count: 228 10*3/uL (ref 150–400)
RBC: 3.56 MIL/uL — ABNORMAL LOW (ref 4.22–5.81)
RDW: 13.8 % (ref 11.5–15.5)
WBC Count: 5.6 10*3/uL (ref 4.0–10.5)
nRBC: 0 % (ref 0.0–0.2)

## 2023-11-06 LAB — CMP (CANCER CENTER ONLY)
ALT: <5 U/L (ref 0–44)
AST: 14 U/L — ABNORMAL LOW (ref 15–41)
Albumin: 4.2 g/dL (ref 3.5–5.0)
Alkaline Phosphatase: 92 U/L (ref 38–126)
Anion gap: 5 (ref 5–15)
BUN: 37 mg/dL — ABNORMAL HIGH (ref 8–23)
CO2: 27 mmol/L (ref 22–32)
Calcium: 9.5 mg/dL (ref 8.9–10.3)
Chloride: 107 mmol/L (ref 98–111)
Creatinine: 1.22 mg/dL (ref 0.61–1.24)
GFR, Estimated: >60 mL/min (ref >60)
Glucose, Bld: 103 mg/dL — ABNORMAL HIGH (ref 70–99)
Potassium: 4.6 mmol/L (ref 3.5–5.1)
Sodium: 139 mmol/L (ref 135–145)
Total Bilirubin: 0.5 mg/dL (ref <1.2)
Total Protein: 6.4 g/dL — ABNORMAL LOW (ref 6.5–8.1)

## 2023-11-06 LAB — FERRITIN: Ferritin: 72 ng/mL (ref 24–336)

## 2023-11-06 LAB — RETICULOCYTES
Immature Retic Fract: 12.3 % (ref 2.3–15.9)
RBC.: 3.59 MIL/uL — ABNORMAL LOW (ref 4.22–5.81)
Retic Count, Absolute: 55.3 10*3/uL (ref 19.0–186.0)
Retic Ct Pct: 1.5 % (ref 0.4–3.1)

## 2023-11-06 LAB — LACTATE DEHYDROGENASE: LDH: 199 U/L — ABNORMAL HIGH (ref 98–192)

## 2023-11-06 MED ORDER — SODIUM CHLORIDE 0.9% FLUSH
10.0000 mL | INTRAVENOUS | Status: DC | PRN
  Administered 2023-11-06: 10 mL via INTRAVENOUS

## 2023-11-06 MED ORDER — HEPARIN SOD (PORK) LOCK FLUSH 100 UNIT/ML IV SOLN
500.0000 [IU] | Freq: Once | INTRAVENOUS | Status: AC
Start: 2023-11-06 — End: 2023-11-06
  Administered 2023-11-06: 500 [IU] via INTRAVENOUS

## 2023-11-06 NOTE — Progress Notes (Signed)
Hematology and Oncology Follow Up Visit  Brian [name] [id number] 03-22-1961 62 y.o. 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 daily -start on 05/29/2023      Interim History:  [name] is in for follow-up.  His main problem has been his back.  He really has had lower back problems.  We will have to see about getting him a referral to Brian [name] GIVE her to Orthopedic Surgery.  Otherwise, he is doing okay.  He has Parkinson's.  This is about the same.  He continues on Xarelto.  He is on 20 mg a day.  Everything so far has been doing pretty well with respect to his right leg.Marland Kitchen  He has had no nausea or vomiting.  He has had no chest pain.  He is eating quite well.  There is been no rashes.  There has been no bleeding.  There has been no change in bowel or bladder habits.  His iron studies back in October showed a ferritin of 83 with an iron saturation of 20%.  He had a PET scan that was done.  This was done on 10/31/2023.  The PET scan did not show any evidence of recurrent Hodgkin's.  Overall, his performance status is ECOG 1.      Medications:  Current Outpatient Medications:    ascorbic acid (VITAMIN C) 500 MG tablet, Take 500 mg by mouth daily., Disp: , Rfl:    carbidopa-levodopa (SINEMET CR) 50-200 MG tablet, TAKE 1 TABLET BY MOUTH AT BEDTIME, Disp: 90 tablet, Rfl: 0   carbidopa-levodopa (SINEMET IR) 25-100 MG tablet, TAKE 2 TABLETS BY MOUTH AT 7AM, 2 TABLETS AT 11AM, 2 TABLETS AT 3PM AND 2 TABLETS AT 7PM, Disp: 720 tablet, Rfl: 0   cholecalciferol  (VITAMIN D3) 25 MCG (1000 UNIT) tablet, Take 1,000 Units by mouth daily., Disp: , Rfl:    diphenhydramine-acetaminophen (TYLENOL PM) 25-500 MG TABS tablet, Take 1 tablet by mouth at bedtime as needed., Disp: , Rfl:    entacapone (COMTAN) 200 MG tablet, Take 1 tablet (200 mg total) by mouth 3 (three) times daily. With first 3 dosages of levodopa, Disp: 270 tablet, Rfl: 1   LORazepam (ATIVAN) 0.5 MG tablet, Take 1 tablet (0.5 mg total) by mouth every 6 (six) hours as needed for anxiety., Disp: 20 tablet, Rfl: 1   pramipexole (MIRAPEX) 0.5 MG tablet, TAKE 1 TABLET(0.5 MG) BY MOUTH THREE TIMES DAILY, Disp: 270 tablet, Rfl: 0   traMADol (ULTRAM) 50 MG tablet, Take 1 tablet (50 mg total) by mouth every 8 (eight) hours as needed., Disp: 30 tablet, Rfl: 1   XARELTO 20 MG TABS tablet, TAKE 1 TABLET(20 MG) BY MOUTH DAILY WITH SUPPER, Disp: 30 tablet, Rfl: 3   tamsulosin (FLOMAX) 0.4 MG CAPS capsule, Take 1 capsule (0.4 mg total) by mouth daily., Disp: 30 capsule, Rfl: 3 No current facility-administered medications for this visit.  Facility-Administered Medications Ordered in Other Visits:    sodium chloride flush (NS) 0.9 % injection 10 mL, 10 mL, Intracatheter, PRN, Brian Macho, MD, 10 mL at 03/19/23  1338   sodium chloride flush (NS) 0.9 % injection 10 mL, 10 mL, Intravenous, PRN, Brian Macho, MD, 10 mL at 11/06/23 1303  Allergies: No Known Allergies  Past Medical History, Surgical history, Social history, and Family History were reviewed and updated.  Review of Systems: Review of Systems  Neurological:  Positive for tremors.  All other systems reviewed and are negative.    Physical Exam:  weight is 177 lb 4 oz (80.4 kg). His oral temperature is 98 F (36.7 C). His blood pressure is 97/60 and his pulse is 82. His respiration is 18 and oxygen saturation is 96%.   Physical Exam Vitals reviewed.  HENT:     Head: Normocephalic and atraumatic.  Eyes:     Pupils: Pupils are equal, round,  and reactive to light.  Cardiovascular:     Rate and Rhythm: Normal rate and regular rhythm.     Heart sounds: Normal heart sounds.  Pulmonary:     Effort: Pulmonary effort is normal.     Breath sounds: Normal breath sounds.  Abdominal:     General: Bowel sounds are normal.     Palpations: Abdomen is soft.  Musculoskeletal:        General: No tenderness or deformity. Normal range of motion.     Cervical back: Normal range of motion.     Comments: There may be a little bit of swelling in the right lower leg.  I cannot palpate any obvious venous cord.  There may be a questionable Homans' sign.  Lymphadenopathy:     Cervical: No cervical adenopathy.  Skin:    General: Skin is warm and dry.     Findings: No erythema or rash.  Neurological:     Mental Status: He is alert and oriented to person, place, and time.  Psychiatric:        Behavior: Behavior normal.        Thought Content: Thought content normal.        Judgment: Judgment normal.     Lab Results  Component Value Date   WBC 5.6 11/06/2023   HGB 11.2 (L) 11/06/2023   HCT 34.7 (L) 11/06/2023   MCV 97.5 11/06/2023   PLT 228 11/06/2023     Chemistry      Component Value Date/Time   NA 139 11/06/2023 1253   NA 142 04/02/2017 0000   NA 140 07/19/2016 1305   K 4.6 11/06/2023 1253   K 4.4 07/19/2016 1305   CL 107 11/06/2023 1253   CL 105 02/08/2016 1117   CL 107 03/25/2013 0828   CO2 27 11/06/2023 1253   CO2 26 07/19/2016 1305   BUN 37 (H) 11/06/2023 1253   BUN 21 04/02/2017 0000   BUN 22.8 07/19/2016 1305   CREATININE 1.22 11/06/2023 1253   CREATININE 1.1 07/19/2016 1305   GLU 105 04/02/2017 0000      Component Value Date/Time   CALCIUM 9.5 11/06/2023 1253   CALCIUM 9.8 07/19/2016 1305   ALKPHOS 92 11/06/2023 1253   ALKPHOS 76 07/19/2016 1305   AST 14 (L) 11/06/2023 1253   AST 18 07/19/2016 1305   ALT <5 11/06/2023 1253   ALT 14 07/19/2016 1305   BILITOT 0.5 11/06/2023 1253   BILITOT 0.70 07/19/2016 1305       Impression and Plan: Brian [name] is a 62 year old gentleman.  He has history of recurrent Hodgkin's disease. 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 He then had a  recurrence after this.  He did undergo CAR-T therapy at [facility].  I believe he had this in April 2017.  He did well with this.  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  This was proven by biopsy.  He had radiation therapy for this.  He subsequently has had another recurrence.  This was in the right axilla.  This was biopsy-proven.  He did undergo radiation therapy for this.  This was completed in September/2023.  Now, we have had another recurrence.  He completed 6 cycles of chemotherapy with ANVD in May.  Thankfully, the recent PET scan does not show any evidence of recurrence.  I do not think we have to do another PET scan probably for 6 months.  I am glad that his hemoglobin is improving.  I would like to think that his iron studies should be better.    I think we can now try to get him back in 3 months.  We will try to get him through the Winter.  He has a Port-A-Cath and so we had to flush this.     Brian Macho, MD 12/11/20242:00 PM

## 2023-11-07 ENCOUNTER — Telehealth: Payer: Self-pay

## 2023-11-07 ENCOUNTER — Encounter: Payer: Self-pay | Admitting: Hematology & Oncology

## 2023-11-07 LAB — IRON AND IRON BINDING CAPACITY (CC-WL,HP ONLY)
Iron: 65 ug/dL (ref 45–182)
Saturation Ratios: 19 % (ref 17.9–39.5)
TIBC: 343 ug/dL (ref 250–450)
UIBC: 278 ug/dL (ref 117–376)

## 2023-11-07 NOTE — Telephone Encounter (Signed)
Advised via MyChart.

## 2023-11-07 NOTE — Telephone Encounter (Signed)
-----   Message from [name] sent at 11/07/2023 11:51 AM EST ----- Please call and let him know that the iron levels are okay.  Thanks.  Cindee Lame

## 2023-11-09 ENCOUNTER — Other Ambulatory Visit: Payer: Self-pay

## 2023-11-16 ENCOUNTER — Other Ambulatory Visit: Payer: Self-pay | Admitting: Neurology

## 2023-11-16 DIAGNOSIS — G20B2 Parkinson's disease with dyskinesia, with fluctuations: Secondary | ICD-10-CM

## 2023-11-18 ENCOUNTER — Other Ambulatory Visit: Payer: Self-pay | Admitting: Neurology

## 2023-11-18 DIAGNOSIS — G20B2 Parkinson's disease with dyskinesia, with fluctuations: Secondary | ICD-10-CM

## 2023-12-10 ENCOUNTER — Other Ambulatory Visit: Payer: Self-pay | Admitting: *Deleted

## 2023-12-10 ENCOUNTER — Encounter: Payer: Self-pay | Admitting: Hematology & Oncology

## 2023-12-10 MED ORDER — AMOXICILLIN 500 MG PO TABS: 2000.0000 mg | ORAL_TABLET | Freq: Once | ORAL | 0 refills | Status: AC

## 2023-12-13 ENCOUNTER — Telehealth: Payer: Self-pay | Admitting: Neurology

## 2023-12-13 NOTE — Telephone Encounter (Signed)
Called patient and he does have his meds

## 2023-12-13 NOTE — Telephone Encounter (Signed)
Patient needs a refill on the pramipexole and the pharmacy states that it is to soon to refill it and he is almost   [facility]

## 2023-12-15 ENCOUNTER — Encounter: Payer: Self-pay | Admitting: Hematology & Oncology

## 2023-12-18 ENCOUNTER — Other Ambulatory Visit: Payer: Self-pay | Admitting: Neurology

## 2023-12-18 DIAGNOSIS — G20B2 Parkinson's disease with dyskinesia, with fluctuations: Secondary | ICD-10-CM

## 2023-12-19 ENCOUNTER — Inpatient Hospital Stay: Payer: Commercial Managed Care - PPO | Attending: Hematology & Oncology

## 2023-12-19 VITALS — BP 141/98 | HR 87 | Temp 97.8°F | Resp 18

## 2023-12-19 DIAGNOSIS — Z452 Encounter for adjustment and management of vascular access device: Secondary | ICD-10-CM | POA: Diagnosis present

## 2023-12-19 DIAGNOSIS — Z95828 Presence of other vascular implants and grafts: Secondary | ICD-10-CM

## 2023-12-19 DIAGNOSIS — C8195 Hodgkin lymphoma, unspecified, lymph nodes of inguinal region and lower limb: Secondary | ICD-10-CM | POA: Insufficient documentation

## 2023-12-19 MED ORDER — SODIUM CHLORIDE 0.9% FLUSH
10.0000 mL | Freq: Once | INTRAVENOUS | Status: AC
  Administered 2023-12-19: 10 mL via INTRAVENOUS

## 2023-12-19 MED ORDER — HEPARIN SOD (PORK) LOCK FLUSH 100 UNIT/ML IV SOLN
500.0000 [IU] | Freq: Once | INTRAVENOUS | Status: AC
  Administered 2023-12-19: 500 [IU] via INTRAVENOUS

## 2023-12-19 NOTE — Patient Instructions (Signed)

## 2023-12-27 ENCOUNTER — Other Ambulatory Visit: Payer: Self-pay

## 2023-12-30 NOTE — Progress Notes (Signed)
 Assessment/Plan:   1.  Parkinsons Disease  -Continue carbidopa /levodopa  25/100 and take 2 tablets at 7 AM, 2 tablets at 11 AM, 2 tablet at 3 PM, 2 tablet at 7 PM.             -Continue carbidopa /levodopa  50/200 at bedtime.  -Continue entacapone  200 mg, with first 3 dosages of levodopa  for wearing off.  R/b/se discussed.             -Continue pramipexole  0.5 mg 3 times per day.  Higher dosages caused hallucinations.  -will consider inbrija  in the future  -Patient sought second opinion at [facility] with Dr. Hans in May, 2023.  There was no significant changes in medication.  -I would like to consider [facility] in this pt.  We had a long discussion about this medication.  He was shown the pump.  We talked about how to use it and how it might help smooth out the on/off time.  Patient has bradykinesia and dyskinesia at this visit.  Patient is really nervous about the size of the pump.  He and I talked about how to deal with that in real life.  He wants to think about his options, and he stated that he would give us  feedback in the next 2 weeks.   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  Patient has synkinetic reinnervation of the right face.   3.  Hodgkin's lymphoma             -PET scan in November, 2023 with evidence of recurrence again.  He has finished his chemotherapy again and is trying to regain strength   4.  Renal insufficiency             -monitoring.  Mild.  This is actually improved compared to previous.   5.  Lumbar radiculopathy             -Patient had microdiscectomy Mar 29, 2021  -Microdiscectomy complicated by subdural CSF fluid collection at L1 vertebral body and L5-S1 level.  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  -Patient seen by [facility] neurology and [facility] neurosurgery.    -ambulation is affected by continued pain.   He just had epidurals yesterday for his back.  6.  DVT  -Developed while on chemo and was weak and now on Xarelto .  -Patient is obviously a fall risk and we will need to monitor. Subjective:   Brian [name] was seen today in follow up for Parkinsons disease.  My previous records were reviewed prior to todays visit as well as outside records available to me.  Pt with girlfriend who supplements the hx. much has happened since our last visit.  Patient has completed chemotherapy.  He continues to struggle with on/off time.  He has >2.5 hours of off time per day.  At other times, he becomes dyskinetic.  He is frustrated.  He stated that he was at a gas station and someone thought that he was drunk and made mention that they were going to call the police.  On another occasion, he was having dyskinesia and someone stated that he should keep the rhythm.  He did just have another epidural for his back.  Entacapone  was added last visit.  It has turned  the urine orange.  He is doing well with this, without side effects.  Current prescribed movement disorder medications: carbidopa /levodopa  25/100, 2 tablets at 7 AM, 2 tablets at 11 AM, 2 tablet at 3 PM, 2 tablet at 7 PM  carbidopa /levodopa  50/200 CR q hs  Pramipexole  0.5 mg 3 times per day  Entacapone , 200 mg 3 times per day  PREVIOUS MEDICATIONS: carbidopa /levodopa  50/200 CR (d/c just because tried it for pain and still waking up with pain in back so d/c it); amantadine  (he didn't want to take it and he stopped it - didn't think that dyskinesia was an issue); gabapentin  without help for PN in past  ALLERGIES:  No Known Allergies  CURRENT MEDICATIONS:  Outpatient Encounter Medications as of 01/01/2024  Medication Sig   ascorbic acid (VITAMIN C) 500 MG tablet Take 500 mg by mouth daily.   carbidopa -levodopa  (SINEMET  CR) 50-200 MG tablet TAKE 1 TABLET BY MOUTH AT BEDTIME   carbidopa -levodopa  (SINEMET  IR) 25-100 MG tablet TAKE 2 TABLETS BY MOUTH AT 7AM, 2  TABLETS AT 11AM, 2 TABLETS AT 3PM AND 2 TABLETS AT 7PM   cholecalciferol  (VITAMIN D3) 25 MCG (1000 UNIT) tablet Take 1,000 Units by mouth daily.   diphenhydramine -acetaminophen  (TYLENOL  PM) 25-500 MG TABS tablet Take 1 tablet by mouth at bedtime as needed.   entacapone  (COMTAN ) 200 MG tablet TAKE 1 TABLET BY MOUTH THREE TIMES DAILY WITH FIRST 3 DOSAGES OF LEVADOPA   entacapone  (COMTAN ) 200 MG tablet TAKE 1 TABLET BY MOUTH THREE TIMES DAILY WITH FIRST 3 DOSAGES OF LEVADOPA   LORazepam  (ATIVAN ) 0.5 MG tablet Take 1 tablet (0.5 mg total) by mouth every 6 (six) hours as needed for anxiety.   pramipexole  (MIRAPEX ) 0.5 MG tablet TAKE 1 TABLET(0.5 MG) BY MOUTH THREE TIMES DAILY   traMADol  (ULTRAM ) 50 MG tablet Take 1 tablet (50 mg total) by mouth every 8 (eight) hours as needed.   XARELTO  20 MG TABS tablet TAKE 1 TABLET(20 MG) BY MOUTH DAILY WITH SUPPER   [DISCONTINUED] tamsulosin  (FLOMAX ) 0.4 MG CAPS capsule Take 1 capsule (0.4 mg total) by mouth daily.   Facility-Administered Encounter Medications as of 01/01/2024  Medication   sodium chloride  flush (NS) 0.9 % injection 10 mL    Objective:   PHYSICAL EXAMINATION:    VITALS:   Vitals:   01/01/24 1108  BP: 132/82  Pulse: 71  SpO2: 97%      GEN:  The patient appears stated age and is in NAD. HEENT:  Normocephalic, atraumatic.  The mucous membranes are moist. The superficial temporal arteries are without ropiness or tenderness. CV:  RRR Lungs:  CTAB Neck/HEME:  There are no carotid bruits bilaterally.  Neurological examination:  Orientation: The patient is alert and oriented x3. Cranial nerves: There is good facial symmetry (slight dec NL fold on R) with facial hypomimia. The speech is fluent and clear. Soft palate rises symmetrically and there is no tongue deviation. Hearing is intact to conversational tone. Sensation: Sensation is intact to light touch throughout Motor: Strength is at least antigravity x4.  Movement  examination: Tone: There is normal tone in the normal Abnormal movements: Patient initially came in in an off state, but halfway through the visit his medications kick then and he became mild to moderately dyskinetic. Coordination:  There is initial bradykinesia when he first came into the office, left greater than right, but halfway through the office visit, he had no signs of bradykinesia at all with no decremation.  His medication had  kicked in. Gait and Station: The patient initially walked into our office, he had a significant amount of freezing around the corner and in the doorway.  However, halfway through the visit his medications kicked in and he walked very well in the hall and turned without any trouble.  He left our office without any difficulty today.  I have reviewed and interpreted the following labs independently    Chemistry      Component Value Date/Time   NA 139 11/06/2023 1253   NA 142 04/02/2017 0000   NA 140 07/19/2016 1305   K 4.6 11/06/2023 1253   K 4.4 07/19/2016 1305   CL 107 11/06/2023 1253   CL 105 02/08/2016 1117   CL 107 03/25/2013 0828   CO2 27 11/06/2023 1253   CO2 26 07/19/2016 1305   BUN 37 (H) 11/06/2023 1253   BUN 21 04/02/2017 0000   BUN 22.8 07/19/2016 1305   CREATININE 1.22 11/06/2023 1253   CREATININE 1.1 07/19/2016 1305   GLU 105 04/02/2017 0000      Component Value Date/Time   CALCIUM  9.5 11/06/2023 1253   CALCIUM  9.8 07/19/2016 1305   ALKPHOS 92 11/06/2023 1253   ALKPHOS 76 07/19/2016 1305   AST 14 (L) 11/06/2023 1253   AST 18 07/19/2016 1305   ALT <5 11/06/2023 1253   ALT 14 07/19/2016 1305   BILITOT 0.5 11/06/2023 1253   BILITOT 0.70 07/19/2016 1305       Lab Results  Component Value Date   WBC 5.6 11/06/2023   HGB 11.2 (L) 11/06/2023   HCT 34.7 (L) 11/06/2023   MCV 97.5 11/06/2023   PLT 228 11/06/2023    Lab Results  Component Value Date   TSH 4.82 09/16/2023     Total time spent on today's visit was 55 minutes,  including both face-to-face time and nonface-to-face time.  Time included that spent on review of records (prior notes available to me/labs/imaging if pertinent), discussing treatment and goals, answering patient's questions and coordinating care.  Cc:  Copland, Harlene BROCKS, MD

## 2023-12-31 ENCOUNTER — Telehealth: Payer: Self-pay

## 2023-12-31 NOTE — Telephone Encounter (Signed)
 Spoke with Rep at [facility] and he indicated that to get approval for this pump we need to have in documentation that patient  1)63 and under 2) Commercial Insurance  3) 21/2+ hours of off time  4) Bradykinesia 5) Dyskinsia  6) [employer]  7) [facility] is the pharmacy

## 2024-01-01 ENCOUNTER — Ambulatory Visit (INDEPENDENT_AMBULATORY_CARE_PROVIDER_SITE_OTHER): Payer: Commercial Managed Care - PPO | Admitting: Neurology

## 2024-01-01 VITALS — BP 132/82 | HR 71

## 2024-01-01 DIAGNOSIS — G20B2 Parkinson's disease with dyskinesia, with fluctuations: Secondary | ICD-10-CM | POA: Diagnosis not present

## 2024-01-01 DIAGNOSIS — M5416 Radiculopathy, lumbar region: Secondary | ICD-10-CM

## 2024-01-02 ENCOUNTER — Other Ambulatory Visit: Payer: Self-pay

## 2024-01-08 ENCOUNTER — Other Ambulatory Visit: Payer: Self-pay | Admitting: Hematology & Oncology

## 2024-01-16 ENCOUNTER — Telehealth: Payer: Self-pay | Admitting: Neurology

## 2024-01-16 NOTE — Telephone Encounter (Signed)
Brian [name] stating he wants to talk with chelsea about their conversation they had a few weeks ago.

## 2024-01-16 NOTE — Telephone Encounter (Signed)
Talked to patient and he decided to not use the pump

## 2024-01-21 ENCOUNTER — Other Ambulatory Visit: Payer: Self-pay | Admitting: Neurology

## 2024-01-21 DIAGNOSIS — G20A1 Parkinson's disease without dyskinesia, without mention of fluctuations: Secondary | ICD-10-CM

## 2024-01-23 ENCOUNTER — Inpatient Hospital Stay: Payer: Commercial Managed Care - PPO | Attending: Hematology & Oncology

## 2024-01-23 ENCOUNTER — Inpatient Hospital Stay (HOSPITAL_BASED_OUTPATIENT_CLINIC_OR_DEPARTMENT_OTHER): Payer: Commercial Managed Care - PPO | Admitting: Hematology & Oncology

## 2024-01-23 ENCOUNTER — Encounter: Payer: Self-pay | Admitting: Hematology & Oncology

## 2024-01-23 ENCOUNTER — Inpatient Hospital Stay: Payer: Commercial Managed Care - PPO

## 2024-01-23 VITALS — BP 106/78 | HR 82 | Temp 97.8°F | Resp 18 | Ht 70.0 in | Wt 174.1 lb

## 2024-01-23 DIAGNOSIS — Z452 Encounter for adjustment and management of vascular access device: Secondary | ICD-10-CM | POA: Diagnosis present

## 2024-01-23 DIAGNOSIS — Z923 Personal history of irradiation: Secondary | ICD-10-CM | POA: Insufficient documentation

## 2024-01-23 DIAGNOSIS — Z8673 Personal history of transient ischemic attack (TIA), and cerebral infarction without residual deficits: Secondary | ICD-10-CM | POA: Insufficient documentation

## 2024-01-23 DIAGNOSIS — Z7901 Long term (current) use of anticoagulants: Secondary | ICD-10-CM | POA: Diagnosis not present

## 2024-01-23 DIAGNOSIS — Z9484 Stem cells transplant status: Secondary | ICD-10-CM | POA: Insufficient documentation

## 2024-01-23 DIAGNOSIS — K909 Intestinal malabsorption, unspecified: Secondary | ICD-10-CM | POA: Diagnosis not present

## 2024-01-23 DIAGNOSIS — C8195 Hodgkin lymphoma, unspecified, lymph nodes of inguinal region and lower limb: Secondary | ICD-10-CM | POA: Diagnosis present

## 2024-01-23 DIAGNOSIS — D509 Iron deficiency anemia, unspecified: Secondary | ICD-10-CM | POA: Insufficient documentation

## 2024-01-23 LAB — CMP (CANCER CENTER ONLY)
ALT: <5 U/L (ref 0–44)
AST: 15 U/L (ref 15–41)
Albumin: 4.3 g/dL (ref 3.5–5.0)
Alkaline Phosphatase: 80 U/L (ref 38–126)
Anion gap: 5 (ref 5–15)
BUN: 40 mg/dL — ABNORMAL HIGH (ref 8–23)
CO2: 28 mmol/L (ref 22–32)
Calcium: 9.2 mg/dL (ref 8.9–10.3)
Chloride: 106 mmol/L (ref 98–111)
Creatinine: 1.5 mg/dL — ABNORMAL HIGH (ref 0.61–1.24)
GFR, Estimated: 52 mL/min — ABNORMAL LOW (ref >60)
Glucose, Bld: 94 mg/dL (ref 70–99)
Potassium: 4.8 mmol/L (ref 3.5–5.1)
Sodium: 139 mmol/L (ref 135–145)
Total Bilirubin: 0.4 mg/dL (ref 0.0–1.2)
Total Protein: 5.9 g/dL — ABNORMAL LOW (ref 6.5–8.1)

## 2024-01-23 LAB — CBC WITH DIFFERENTIAL (CANCER CENTER ONLY)
Abs Immature Granulocytes: 0.01 10*3/uL (ref 0.00–0.07)
Basophils Absolute: 0 10*3/uL (ref 0.0–0.1)
Basophils Relative: 1 %
Eosinophils Absolute: 0.2 10*3/uL (ref 0.0–0.5)
Eosinophils Relative: 3 %
HCT: 35.7 % — ABNORMAL LOW (ref 39.0–52.0)
Hemoglobin: 11.7 g/dL — ABNORMAL LOW (ref 13.0–17.0)
Immature Granulocytes: 0 %
Lymphocytes Relative: 11 %
Lymphs Abs: 0.7 10*3/uL (ref 0.7–4.0)
MCH: 32.4 pg (ref 26.0–34.0)
MCHC: 32.8 g/dL (ref 30.0–36.0)
MCV: 98.9 fL (ref 80.0–100.0)
Monocytes Absolute: 0.8 10*3/uL (ref 0.1–1.0)
Monocytes Relative: 13 %
Neutro Abs: 4.3 10*3/uL (ref 1.7–7.7)
Neutrophils Relative %: 72 %
Platelet Count: 210 10*3/uL (ref 150–400)
RBC: 3.61 MIL/uL — ABNORMAL LOW (ref 4.22–5.81)
RDW: 13.4 % (ref 11.5–15.5)
WBC Count: 5.9 10*3/uL (ref 4.0–10.5)
nRBC: 0 % (ref 0.0–0.2)

## 2024-01-23 LAB — RETICULOCYTES
Immature Retic Fract: 10.7 % (ref 2.3–15.9)
RBC.: 3.59 MIL/uL — ABNORMAL LOW (ref 4.22–5.81)
Retic Count, Absolute: 52.4 10*3/uL (ref 19.0–186.0)
Retic Ct Pct: 1.5 % (ref 0.4–3.1)

## 2024-01-23 LAB — IRON AND IRON BINDING CAPACITY (CC-WL,HP ONLY)
Iron: 62 ug/dL (ref 45–182)
Saturation Ratios: 17 % — ABNORMAL LOW (ref 17.9–39.5)
TIBC: 356 ug/dL (ref 250–450)
UIBC: 294 ug/dL (ref 117–376)

## 2024-01-23 LAB — FERRITIN: Ferritin: 76 ng/mL (ref 24–336)

## 2024-01-23 MED ORDER — SODIUM CHLORIDE 0.9% FLUSH
10.0000 mL | Freq: Once | INTRAVENOUS | Status: AC
  Administered 2024-01-23: 10 mL

## 2024-01-23 MED ORDER — HEPARIN SOD (PORK) LOCK FLUSH 100 UNIT/ML IV SOLN
500.0000 [IU] | Freq: Once | INTRAVENOUS | Status: AC
  Administered 2024-01-23: 500 [IU] via INTRAVENOUS

## 2024-01-23 NOTE — Progress Notes (Signed)
 Hematology and Oncology Follow Up Visit  DANEL REQUENA [id number] 05/09/1961 63 y.o. 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 daily -start on 05/29/2023      Interim History:  Mr.  Fredin is in for follow-up.  Thankfully, his back seem to be doing a little bit better.  I think he may be going for an ablation of the facet joint.  Hopefully this will give him a little more relief.  Otherwise, he comes in with his girlfriend.  I must say that she is incredibly dedicated to him.  They both go to the gym together.  She really has a great job of making sure that he has what he needs.  He has had no problems with nausea or vomiting.  He is eating quite well.  He has had no problems with rashes.  He does have little bit of itch.  He does have looks like some actinic keratoses on skin.  I think that he does have a dermatologist that he can call.  When we last saw him, his ferritin was 72 with an iron saturation of 19%.  He has had no fever.  Thankfully, he has had no problems with COVID.  His Parkinson's seems to be about the same.  I know he is being followed aggressively by Neurology.  He does have a history of thromboembolic disease.  He is on Xarelto.  I think he is doing fairly well with the Xarelto.'s last Doppler, he is doing well.  Doppler showed that he had a very nice response with only some minimal thromboembolic disease left.  Overall, I would have to say that his performance status is ECOG 1.   Medications:  Current Outpatient Medications:    ascorbic  acid (VITAMIN C) 500 MG tablet, Take 500 mg by mouth daily., Disp: , Rfl:    carbidopa-levodopa (SINEMET CR) 50-200 MG tablet, TAKE 1 TABLET BY MOUTH AT BEDTIME, Disp: 90 tablet, Rfl: 0   carbidopa-levodopa (SINEMET IR) 25-100 MG tablet, TAKE 2 TABLETS BY MOUTH AT 7AM, 2 TABLETS AT 11AM, 2 TABLETS AT 3PM AND 2 TABLETS AT 7PM, Disp: 720 tablet, Rfl: 0   cholecalciferol (VITAMIN D3) 25 MCG (1000 UNIT) tablet, Take 1,000 Units by mouth daily., Disp: , Rfl:    diphenhydramine-acetaminophen (TYLENOL PM) 25-500 MG TABS tablet, Take 1 tablet by mouth at bedtime as needed., Disp: , Rfl:    entacapone (COMTAN) 200 MG tablet, TAKE 1 TABLET BY MOUTH THREE TIMES DAILY WITH FIRST 3 DOSAGES OF LEVADOPA, Disp: 270 tablet, Rfl: 0   LORazepam (ATIVAN) 0.5 MG tablet, Take 1 tablet (0.5 mg total) by mouth every 6 (six) hours as needed for anxiety., Disp: 20 tablet, Rfl: 1   pramipexole (MIRAPEX) 0.5 MG tablet, TAKE 1 TABLET(0.5 MG) BY MOUTH THREE TIMES DAILY, Disp: 270 tablet, Rfl: 0   XARELTO 20 MG TABS tablet, TAKE 1 TABLET(20 MG) BY MOUTH DAILY WITH SUPPER, Disp: 30 tablet, Rfl: 3   traMADol (ULTRAM) 50 MG tablet, Take 1 tablet (50 mg total) by mouth every 8 (eight) hours as needed. (Patient  not taking: Reported on 01/23/2024), Disp: 30 tablet, Rfl: 1 No current facility-administered medications for this visit.  Facility-Administered Medications Ordered in Other Visits:    sodium chloride flush (NS) 0.9 % injection 10 mL, 10 mL, Intracatheter, PRN, [name], MD, 10 mL at 03/19/23 1338  Allergies: No Known Allergies  Past Medical History, Surgical history, Social history, and Family History were reviewed and updated.  Review of Systems: Review of Systems  Neurological:  Positive for tremors.  All other systems reviewed and are negative.    Physical Exam:  height is 5\' 10"  (1.778 m) and weight is 174 lb 1.9 oz (79 kg). His oral temperature is 97.8 F (36.6 C). His blood pressure is 106/78 and his  pulse is 82. His respiration is 18 and oxygen saturation is 100%.   Physical Exam Vitals reviewed.  HENT:     Head: Normocephalic and atraumatic.  Eyes:     Pupils: Pupils are equal, round, and reactive to light.  Cardiovascular:     Rate and Rhythm: Normal rate and regular rhythm.     Heart sounds: Normal heart sounds.  Pulmonary:     Effort: Pulmonary effort is normal.     Breath sounds: Normal breath sounds.  Abdominal:     General: Bowel sounds are normal.     Palpations: Abdomen is soft.  Musculoskeletal:        General: No tenderness or deformity. Normal range of motion.     Cervical back: Normal range of motion.     Comments: There may be a little bit of swelling in the right lower leg.  I cannot palpate any obvious venous cord.  There may be a questionable Homans' sign.  Lymphadenopathy:     Cervical: No cervical adenopathy.  Skin:    General: Skin is warm and dry.     Findings: No erythema or rash.  Neurological:     Mental Status: He is alert and oriented to person, place, and time.  Psychiatric:        Behavior: Behavior normal.        Thought Content: Thought content normal.        Judgment: Judgment normal.    Lab Results  Component Value Date   WBC 5.9 01/23/2024   HGB 11.7 (L) 01/23/2024   HCT 35.7 (L) 01/23/2024   MCV 98.9 01/23/2024   PLT 210 01/23/2024     Chemistry      Component Value Date/Time   NA 139 11/06/2023 1253   NA 142 04/02/2017 0000   NA 140 07/19/2016 1305   K 4.6 11/06/2023 1253   K 4.4 07/19/2016 1305   CL 107 11/06/2023 1253   CL 105 02/08/2016 1117   CL 107 03/25/2013 0828   CO2 27 11/06/2023 1253   CO2 26 07/19/2016 1305   BUN 37 (H) 11/06/2023 1253   BUN 21 04/02/2017 0000   BUN 22.8 07/19/2016 1305   CREATININE 1.22 11/06/2023 1253   CREATININE 1.1 07/19/2016 1305   GLU 105 04/02/2017 0000      Component Value Date/Time   CALCIUM 9.5 11/06/2023 1253   CALCIUM 9.8 07/19/2016 1305   ALKPHOS 92 11/06/2023 1253    ALKPHOS 76 07/19/2016 1305   AST 14 (L) 11/06/2023 1253   AST 18 07/19/2016 1305   ALT <5 11/06/2023 1253   ALT 14 07/19/2016 1305   BILITOT 0.5 11/06/2023 1253   BILITOT 0.70 07/19/2016 1305      Impression and Plan:  Mr. Dobratz is a 63 year old gentleman.  He has history of recurrent Hodgkin's disease. 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 He then had a recurrence after this.  He did undergo CAR-T therapy at [facility].  I believe he had this in April 2017.  He did well with this.  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  This was proven by biopsy.  He had radiation therapy for this.  He subsequently has had another recurrence.  This was in the right axilla.  This was biopsy-proven.  He did undergo radiation therapy for this.  This was completed in September/2023.  Now, we have had another recurrence.  He completed 6 cycles of chemotherapy with ANVD in May 2024.    I do not think we have to do another PET scan probably for 6 months.  I am glad that his hemoglobin is improving.  I would like to think that his iron studies should be better.  We will see what the iron studies look like.  I will plan to get him back in about 2 months or so.  He does have a Port-A-Cath that we still have to flush.       [name], MD 2/27/20251:50 PM

## 2024-01-24 ENCOUNTER — Encounter: Payer: Self-pay | Admitting: *Deleted

## 2024-01-28 ENCOUNTER — Telehealth: Payer: Self-pay | Admitting: Hematology & Oncology

## 2024-01-28 NOTE — Telephone Encounter (Signed)
 Called to schedule IV Iron. LVM to return call for scheduling.

## 2024-01-30 ENCOUNTER — Other Ambulatory Visit: Payer: Self-pay | Admitting: Family Medicine

## 2024-01-30 DIAGNOSIS — F419 Anxiety disorder, unspecified: Secondary | ICD-10-CM

## 2024-02-03 ENCOUNTER — Inpatient Hospital Stay: Attending: Hematology & Oncology

## 2024-02-03 VITALS — BP 126/66 | HR 85 | Temp 98.3°F | Resp 17

## 2024-02-03 DIAGNOSIS — Z9484 Stem cells transplant status: Secondary | ICD-10-CM | POA: Diagnosis not present

## 2024-02-03 DIAGNOSIS — D509 Iron deficiency anemia, unspecified: Secondary | ICD-10-CM | POA: Diagnosis not present

## 2024-02-03 DIAGNOSIS — R634 Abnormal weight loss: Secondary | ICD-10-CM

## 2024-02-03 DIAGNOSIS — K909 Intestinal malabsorption, unspecified: Secondary | ICD-10-CM | POA: Diagnosis not present

## 2024-02-03 DIAGNOSIS — C8195 Hodgkin lymphoma, unspecified, lymph nodes of inguinal region and lower limb: Secondary | ICD-10-CM | POA: Insufficient documentation

## 2024-02-03 DIAGNOSIS — Z923 Personal history of irradiation: Secondary | ICD-10-CM | POA: Insufficient documentation

## 2024-02-03 DIAGNOSIS — Z79899 Other long term (current) drug therapy: Secondary | ICD-10-CM | POA: Insufficient documentation

## 2024-02-03 MED ORDER — SODIUM CHLORIDE 0.9 % IV SOLN: Freq: Once | INTRAVENOUS | Status: AC

## 2024-02-03 MED ORDER — SODIUM CHLORIDE 0.9% FLUSH
10.0000 mL | INTRAVENOUS | Status: DC | PRN
Start: 2024-02-03 — End: 2024-02-03
  Administered 2024-02-03: 10 mL via INTRAVENOUS

## 2024-02-03 MED ORDER — HEPARIN SOD (PORK) LOCK FLUSH 100 UNIT/ML IV SOLN
500.0000 [IU] | Freq: Once | INTRAVENOUS | Status: AC
  Administered 2024-02-03: 500 [IU] via INTRAVENOUS

## 2024-02-03 MED ORDER — IRON SUCROSE 20 MG/ML IV SOLN
300.0000 mg | Freq: Once | INTRAVENOUS | Status: AC
  Administered 2024-02-03: 300 mg via INTRAVENOUS
  Filled 2024-02-03: qty 300

## 2024-02-03 NOTE — Patient Instructions (Signed)

## 2024-02-03 NOTE — Progress Notes (Signed)
 Pt refused to stay for 30 minutes post iron infusion.  Pt without complaints at time of discharge. ?

## 2024-02-07 ENCOUNTER — Other Ambulatory Visit: Payer: Self-pay | Admitting: Neurology

## 2024-02-07 DIAGNOSIS — G20B2 Parkinson's disease with dyskinesia, with fluctuations: Secondary | ICD-10-CM

## 2024-02-15 ENCOUNTER — Other Ambulatory Visit: Payer: Self-pay | Admitting: Neurology

## 2024-02-15 DIAGNOSIS — G20B2 Parkinson's disease with dyskinesia, with fluctuations: Secondary | ICD-10-CM

## 2024-02-26 ENCOUNTER — Encounter: Payer: Self-pay | Admitting: Family Medicine

## 2024-02-27 NOTE — Telephone Encounter (Signed)
 TE created on the sons chart.

## 2024-03-12 ENCOUNTER — Other Ambulatory Visit: Payer: Self-pay | Admitting: Neurology

## 2024-03-12 DIAGNOSIS — G20B2 Parkinson's disease with dyskinesia, with fluctuations: Secondary | ICD-10-CM

## 2024-03-16 ENCOUNTER — Telehealth: Payer: Self-pay

## 2024-03-16 ENCOUNTER — Other Ambulatory Visit: Payer: Self-pay

## 2024-03-16 DIAGNOSIS — C8195 Hodgkin lymphoma, unspecified, lymph nodes of inguinal region and lower limb: Secondary | ICD-10-CM

## 2024-03-16 NOTE — Telephone Encounter (Signed)
 Brian [name] called and left voicemail message. I called him back to find out how I could help 1) He asked if after his visit if we could send a refill to his pharmacy so he doesn't get so close to running out and will have 6 months worth 2) he is falling a lot and feels his legs are just coming out from under him 3) He has lost around 15 pounds and Dr. Maria Shiner is talking to him about doing another Pet Scan since he is eating and trying to gain weight.

## 2024-03-16 NOTE — Telephone Encounter (Signed)
 Patient called and stated he ahs had about 10-15 pounds of weight loss since March and wants to know if we should move his PET scan up, as it is currently not to be done until June. Discussed with MD. Patient to now get PET before appt on 5/1. Order modified.

## 2024-03-24 ENCOUNTER — Ambulatory Visit (HOSPITAL_COMMUNITY)
Admission: RE | Admit: 2024-03-24 | Discharge: 2024-03-24 | Disposition: A | Discharge status: Home / Self Care | Source: Ambulatory Visit | Attending: Hematology & Oncology | Admitting: Hematology & Oncology

## 2024-03-24 DIAGNOSIS — C8195 Hodgkin lymphoma, unspecified, lymph nodes of inguinal region and lower limb: Secondary | ICD-10-CM | POA: Insufficient documentation

## 2024-03-24 LAB — GLUCOSE, CAPILLARY: Glucose-Capillary: 94 mg/dL (ref 70–99)

## 2024-03-24 MED ORDER — FLUDEOXYGLUCOSE F - 18 (FDG) INJECTION
9.0000 | Freq: Once | INTRAVENOUS | Status: AC | PRN
  Administered 2024-03-24: 8.23 via INTRAVENOUS

## 2024-03-26 ENCOUNTER — Inpatient Hospital Stay: Payer: Commercial Managed Care - PPO | Admitting: Hematology & Oncology

## 2024-03-26 ENCOUNTER — Inpatient Hospital Stay: Payer: Commercial Managed Care - PPO

## 2024-03-26 ENCOUNTER — Encounter: Payer: Self-pay | Admitting: Hematology & Oncology

## 2024-03-26 ENCOUNTER — Other Ambulatory Visit: Payer: Self-pay

## 2024-03-26 ENCOUNTER — Inpatient Hospital Stay: Payer: Commercial Managed Care - PPO | Attending: Hematology & Oncology

## 2024-03-26 VITALS — BP 136/88 | HR 83 | Temp 97.4°F | Resp 18 | Ht 70.0 in | Wt 167.0 lb

## 2024-03-26 DIAGNOSIS — R1013 Epigastric pain: Secondary | ICD-10-CM | POA: Diagnosis not present

## 2024-03-26 DIAGNOSIS — D508 Other iron deficiency anemias: Secondary | ICD-10-CM | POA: Insufficient documentation

## 2024-03-26 DIAGNOSIS — C8195 Hodgkin lymphoma, unspecified, lymph nodes of inguinal region and lower limb: Secondary | ICD-10-CM | POA: Diagnosis not present

## 2024-03-26 DIAGNOSIS — Z79899 Other long term (current) drug therapy: Secondary | ICD-10-CM | POA: Insufficient documentation

## 2024-03-26 DIAGNOSIS — R634 Abnormal weight loss: Secondary | ICD-10-CM | POA: Diagnosis not present

## 2024-03-26 DIAGNOSIS — G20A1 Parkinson's disease without dyskinesia, without mention of fluctuations: Secondary | ICD-10-CM | POA: Diagnosis not present

## 2024-03-26 DIAGNOSIS — Z9181 History of falling: Secondary | ICD-10-CM | POA: Insufficient documentation

## 2024-03-26 DIAGNOSIS — Z7901 Long term (current) use of anticoagulants: Secondary | ICD-10-CM | POA: Insufficient documentation

## 2024-03-26 DIAGNOSIS — Z8673 Personal history of transient ischemic attack (TIA), and cerebral infarction without residual deficits: Secondary | ICD-10-CM | POA: Insufficient documentation

## 2024-03-26 DIAGNOSIS — Z9484 Stem cells transplant status: Secondary | ICD-10-CM | POA: Insufficient documentation

## 2024-03-26 DIAGNOSIS — K909 Intestinal malabsorption, unspecified: Secondary | ICD-10-CM | POA: Insufficient documentation

## 2024-03-26 DIAGNOSIS — Z923 Personal history of irradiation: Secondary | ICD-10-CM | POA: Insufficient documentation

## 2024-03-26 LAB — CBC WITH DIFFERENTIAL (CANCER CENTER ONLY)
Abs Immature Granulocytes: 0.03 10*3/uL (ref 0.00–0.07)
Basophils Absolute: 0.1 10*3/uL (ref 0.0–0.1)
Basophils Relative: 1 %
Eosinophils Absolute: 0.2 10*3/uL (ref 0.0–0.5)
Eosinophils Relative: 3 %
HCT: 36.3 % — ABNORMAL LOW (ref 39.0–52.0)
Hemoglobin: 11.8 g/dL — ABNORMAL LOW (ref 13.0–17.0)
Immature Granulocytes: 1 %
Lymphocytes Relative: 13 %
Lymphs Abs: 0.7 10*3/uL (ref 0.7–4.0)
MCH: 32.1 pg (ref 26.0–34.0)
MCHC: 32.5 g/dL (ref 30.0–36.0)
MCV: 98.6 fL (ref 80.0–100.0)
Monocytes Absolute: 0.8 10*3/uL (ref 0.1–1.0)
Monocytes Relative: 15 %
Neutro Abs: 3.7 10*3/uL (ref 1.7–7.7)
Neutrophils Relative %: 67 %
Platelet Count: 239 10*3/uL (ref 150–400)
RBC: 3.68 MIL/uL — ABNORMAL LOW (ref 4.22–5.81)
RDW: 13.3 % (ref 11.5–15.5)
WBC Count: 5.5 10*3/uL (ref 4.0–10.5)
nRBC: 0 % (ref 0.0–0.2)

## 2024-03-26 LAB — CMP (CANCER CENTER ONLY)
ALT: <5 U/L (ref 0–44)
AST: 15 U/L (ref 15–41)
Albumin: 4.2 g/dL (ref 3.5–5.0)
Alkaline Phosphatase: 88 U/L (ref 38–126)
Anion gap: 6 (ref 5–15)
BUN: 37 mg/dL — ABNORMAL HIGH (ref 8–23)
CO2: 29 mmol/L (ref 22–32)
Calcium: 9.1 mg/dL (ref 8.9–10.3)
Chloride: 105 mmol/L (ref 98–111)
Creatinine: 1.31 mg/dL — ABNORMAL HIGH (ref 0.61–1.24)
GFR, Estimated: >60 mL/min (ref >60)
Glucose, Bld: 102 mg/dL — ABNORMAL HIGH (ref 70–99)
Potassium: 4.5 mmol/L (ref 3.5–5.1)
Sodium: 140 mmol/L (ref 135–145)
Total Bilirubin: 0.5 mg/dL (ref 0.0–1.2)
Total Protein: 6.1 g/dL — ABNORMAL LOW (ref 6.5–8.1)

## 2024-03-26 LAB — RETICULOCYTES
Immature Retic Fract: 9.4 % (ref 2.3–15.9)
RBC.: 3.63 MIL/uL — ABNORMAL LOW (ref 4.22–5.81)
Retic Count, Absolute: 49.7 10*3/uL (ref 19.0–186.0)
Retic Ct Pct: 1.4 % (ref 0.4–3.1)

## 2024-03-26 LAB — LACTATE DEHYDROGENASE: LDH: 208 U/L — ABNORMAL HIGH (ref 98–192)

## 2024-03-26 LAB — IRON AND IRON BINDING CAPACITY (CC-WL,HP ONLY)
Iron: 66 ug/dL (ref 45–182)
Saturation Ratios: 20 % (ref 17.9–39.5)
TIBC: 325 ug/dL (ref 250–450)
UIBC: 259 ug/dL (ref 117–376)

## 2024-03-26 LAB — FERRITIN: Ferritin: 199 ng/mL (ref 24–336)

## 2024-03-26 MED ORDER — RIVAROXABAN 10 MG PO TABS: 10.0000 mg | ORAL_TABLET | Freq: Every day | ORAL | 3 refills | Status: DC

## 2024-03-26 MED ORDER — DRONABINOL 5 MG PO CAPS: 5.0000 mg | ORAL_CAPSULE | Freq: Two times a day (BID) | ORAL | 0 refills | Status: DC

## 2024-03-26 NOTE — Progress Notes (Signed)
 Hematology and Oncology Follow Up Visit  Brian [name] [id number] Nov 23, 1961 63 y.o. 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 daily -start on 05/29/2023 - changed to 10 mg po q day on 03/26/2024     Interim History:  Brian [name] is in for follow-up.  His main concern now is with some weight loss.  He wanted to have the PET scan done early.  We did go ahead and do a PET scan on him.  This was done on 03/24/2024.  To no surprise, the PET scan did not show any evidence of recurrent Hodgkin's disease.  I think his main problem continues to be the neurological issues that he had after the CAR-T therapy.  He has Parkinson's.  I do believe that the Parkinson's is probably the most likely reason for the weight loss.  I also has had some problems falling.  I think that this also is probably from the Parkinson's.  He is exercising.  He is trying to stay in shape.  I applauded him for this.  He is still on Xarelto .  I will switch him down to 10 mg a day of Xarelto .  He will be on Xarelto  for a year in July.  I think at that point time, we can probably stop the Xarelto .  His appetite is doing okay.  He might be able to use a little bit of Marinol  to see if we can get him to eat a little bit more.  I think Marinol  at 5 mg p.o. twice daily would not be a bad idea.  He has had no problems with bleeding.  When we last checked his iron  studies, his ferritin was 76 with an iron  saturation of 17%.  He did get some IV iron  in March.  Overall, I would have to assess his performance status  of prior ECOG 2.  Medications:  Current Outpatient Medications:    ascorbic acid (VITAMIN C) 500 MG tablet, Take 500 mg by mouth daily., Disp: , Rfl:    carbidopa -levodopa  (SINEMET  CR) 50-200 MG tablet, TAKE 1 TABLET BY MOUTH AT BEDTIME, Disp: 90 tablet, Rfl: 0   carbidopa -levodopa  (SINEMET  IR) 25-100 MG tablet, TAKE 2 TABLETS BY MOUTH AT 7AM, 2 TABLETS AT 11AM, 2 TABLETS AT 3PM AND 2 TABLETS AT 7PM, Disp: 720 tablet, Rfl: 0   cholecalciferol  (VITAMIN D3) 25 MCG (1000 UNIT) tablet, Take 1,000 Units by mouth daily., Disp: , Rfl:    diphenhydramine -acetaminophen  (TYLENOL  PM) 25-500 MG TABS tablet, Take 1 tablet by mouth at bedtime as needed., Disp: , Rfl:    entacapone  (COMTAN ) 200 MG tablet, TAKE 1 TABLET BY MOUTH THREE TIMES DAILY WITH FIRST 3 DOSAGES OF LEVADOPA, Disp: 270 tablet, Rfl: 0   LORazepam  (ATIVAN ) 0.5 MG tablet, TAKE 1 TABLET(0.5 MG) BY MOUTH EVERY 6 HOURS AS NEEDED FOR ANXIETY, Disp: 20 tablet, Rfl: 1   pramipexole  (MIRAPEX ) 0.5 MG tablet, TAKE 1 TABLET(0.5 MG) BY MOUTH THREE TIMES DAILY, Disp: 270 tablet, Rfl: 0   XARELTO  20 MG TABS tablet, TAKE 1 TABLET(20 MG) BY MOUTH  DAILY WITH SUPPER, Disp: 30 tablet, Rfl: 3 No current facility-administered medications for this visit.  Facility-Administered Medications Ordered in Other Visits:    sodium chloride  flush (NS) 0.9 % injection 10 mL, 10 mL, Intracatheter, PRN, Luciano Cinquemani R, MD, 10 mL at 03/19/23 1338  Allergies: No Known Allergies  Past Medical History, Surgical history, Social history, and Family History were reviewed and updated.  Review of Systems: Review of Systems  Neurological:  Positive for tremors.  All other systems reviewed and are negative.    Physical Exam:  height is 5\' 10"  (1.778 m) and weight is 167 lb (75.8 kg). His oral temperature is 97.4 F (36.3 C) (abnormal). His blood pressure is 136/88 and his pulse is 83. His respiration is 18 and oxygen saturation is 94%.   Physical Exam Vitals reviewed.   HENT:     Head: Normocephalic and atraumatic.  Eyes:     Pupils: Pupils are equal, round, and reactive to light.  Cardiovascular:     Rate and Rhythm: Normal rate and regular rhythm.     Heart sounds: Normal heart sounds.  Pulmonary:     Effort: Pulmonary effort is normal.     Breath sounds: Normal breath sounds.  Abdominal:     General: Bowel sounds are normal.     Palpations: Abdomen is soft.  Musculoskeletal:        General: No tenderness or deformity. Normal range of motion.     Cervical back: Normal range of motion.     Comments: There may be a little bit of swelling in the right lower leg.  I cannot palpate any obvious venous cord.  There may be a questionable Homans' sign.  Lymphadenopathy:     Cervical: No cervical adenopathy.  Skin:    General: Skin is warm and dry.     Findings: No erythema or rash.  Neurological:     Mental Status: He is alert and oriented to person, place, and time.  Psychiatric:        Behavior: Behavior normal.        Thought Content: Thought content normal.        Judgment: Judgment normal.    Lab Results  Component Value Date   WBC 5.9 01/23/2024   HGB 11.7 (L) 01/23/2024   HCT 35.7 (L) 01/23/2024   MCV 98.9 01/23/2024   PLT 210 01/23/2024     Chemistry      Component Value Date/Time   NA 139 01/23/2024 1319   NA 142 04/02/2017 0000   NA 140 07/19/2016 1305   K 4.8 01/23/2024 1319   K 4.4 07/19/2016 1305   CL 106 01/23/2024 1319   CL 105 02/08/2016 1117   CL 107 03/25/2013 0828   CO2 28 01/23/2024 1319   CO2 26 07/19/2016 1305   BUN 40 (H) 01/23/2024 1319   BUN 21 04/02/2017 0000   BUN 22.8 07/19/2016 1305   CREATININE 1.50 (H) 01/23/2024 1319   CREATININE 1.1 07/19/2016 1305   GLU 105 04/02/2017 0000      Component Value Date/Time   CALCIUM  9.2 01/23/2024 1319   CALCIUM  9.8 07/19/2016 1305   ALKPHOS 80 01/23/2024 1319   ALKPHOS 76 07/19/2016 1305   AST 15 01/23/2024 1319   AST 18 07/19/2016 1305   ALT <5 01/23/2024  1319   ALT 14 07/19/2016 1305   BILITOT 0.4 01/23/2024 1319   BILITOT 0.70 07/19/2016 1305      Impression and Plan: [name] is  a 63 year old gentleman.  He has history of recurrent Hodgkin's disease. 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 He then had a recurrence after this.  He did undergo CAR[facility]T therapy at [facility].  I believe he had this in April 2017.  He did well with this.  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  This was proven by biopsy.  He had radiation therapy for this.  He subsequently has had another recurrence.  This was in the right axilla.  This was biopsy-proven.  He did undergo radiation therapy for this.  This was completed in September/2023.  Now, we have had another recurrence.  He completed 6 cycles of chemotherapy with ANVD in May 2024.  Thankfully, he is still in remission.  Somehow, I really think that his prognosis will be based upon the Parkinson's.  I really do not think we have to do another PET scan on him probably until the Fall.  Hopefully, we will be able to stop the anticoagulation and July.  I would like to see him back probably in about 2 months.  Hopefully, he will be gaining some weight.      Ivor Mars, MD 5/1/202512:20 PM

## 2024-03-26 NOTE — Patient Instructions (Signed)

## 2024-03-27 ENCOUNTER — Encounter: Payer: Self-pay | Admitting: *Deleted

## 2024-03-27 LAB — PSA, TOTAL AND FREE
PSA, Free Pct: 19 %
PSA, Free: 0.4 ng/mL
Prostate Specific Ag, Serum: 2.1 ng/mL (ref 0.0–4.0)

## 2024-03-28 ENCOUNTER — Other Ambulatory Visit: Payer: Self-pay

## 2024-04-06 ENCOUNTER — Other Ambulatory Visit: Payer: Self-pay | Admitting: Hematology & Oncology

## 2024-04-06 ENCOUNTER — Telehealth: Payer: Self-pay | Admitting: *Deleted

## 2024-04-06 DIAGNOSIS — C8195 Hodgkin lymphoma, unspecified, lymph nodes of inguinal region and lower limb: Secondary | ICD-10-CM

## 2024-04-06 NOTE — Telephone Encounter (Signed)
 Call received from patient to request from Dr. Maria Shiner to have his port removed.  Dr. Maria Shiner notified.  Call placed back to patient and patient notified that Dr Maria Shiner is ok with him getting his port out and that order has been placed.  Pt is appreciative of call and has no further questions or concerns at this time.

## 2024-04-09 ENCOUNTER — Ambulatory Visit (HOSPITAL_COMMUNITY)
Admission: RE | Admit: 2024-04-09 | Discharge: 2024-04-09 | Disposition: A | Discharge status: Home / Self Care | Source: Ambulatory Visit | Attending: Hematology & Oncology | Admitting: Hematology & Oncology

## 2024-04-09 DIAGNOSIS — Z452 Encounter for adjustment and management of vascular access device: Secondary | ICD-10-CM | POA: Diagnosis present

## 2024-04-09 DIAGNOSIS — C8195 Hodgkin lymphoma, unspecified, lymph nodes of inguinal region and lower limb: Secondary | ICD-10-CM | POA: Diagnosis not present

## 2024-04-09 HISTORY — PX: IR REMOVAL TUN ACCESS W/ PORT W/O FL MOD SED: IMG2290

## 2024-04-09 MED ORDER — LIDOCAINE-EPINEPHRINE 1 %-1:100000 IJ SOLN
INTRAMUSCULAR | Status: AC
  Filled 2024-04-09: qty 1

## 2024-04-09 MED ORDER — LIDOCAINE-EPINEPHRINE 1 %-1:100000 IJ SOLN: 20.0000 mL | Freq: Once | INTRAMUSCULAR | Status: DC

## 2024-04-09 NOTE — Procedures (Signed)
 Interventional Radiology Procedure:   Indications: History of Hodgkin's lymphoma  Procedure: Port explant  Findings: Complete removal of right chest port.  Complications: None     EBL: Minimal  Plan: Keep incision dry for 24 hours  Brian Milo R. Julietta Ogren, MD  Pager: [phone]

## 2024-04-15 ENCOUNTER — Other Ambulatory Visit: Payer: Self-pay

## 2024-04-23 ENCOUNTER — Telehealth: Payer: Self-pay | Admitting: *Deleted

## 2024-04-23 NOTE — Telephone Encounter (Signed)
 Received a call from patient stating that he is still losing weight.  It goes up and down but has lost approx 20 pounds since March.  Asked if patient was taking the Marinol  as prescribed by dr Maria Shiner.  He is not taking it.  Reminded patient to take this as  prescribed and see if it doesn't help with appetite.  Patient in agreement and will try the Marinol .  Dr Maria Shiner notified.

## 2024-04-24 ENCOUNTER — Ambulatory Visit: Payer: Self-pay

## 2024-04-24 NOTE — Telephone Encounter (Signed)
 Pt hung up before being transferred, attepmted to call pt back, left VM to return the call to the office to speak to NT.    Copied from CRM [id number]. Topic: Clinical - Red Word Triage >> Apr 24, 2024 11:09 AM Magdalene [employer] wrote: Red Word that prompted transfer to Nurse Triage: Concerned about weight loss.

## 2024-04-24 NOTE — Telephone Encounter (Signed)
 Chief Complaint: unintended weight loss Symptoms: diarrhea Frequency: ongoing Pertinent Negatives: Patient denies fever, night sweats, vomiting, loss of appetite, weakness, dizziness Disposition: [] ED /[] Urgent Care (no appt availability in office) / [x] Appointment(In office/virtual)/ []  [facility]  Virtual Care/ [] Home Care/ [] Refused Recommended Disposition /[] [facility]/ []  Follow-up with PCP Additional Notes: Pt reports unintentional weight loss over several months. Pt has a hx of lymphoma and was seen by oncology 5/1 for weight loss. Pt states his recent labwork and scans are clear and reassuring. Pt reports he typically weighs 175lbs and recently he has weighed 160 lbs. Pt states he has a good appetite and is hungry and eating and drinking normally. Pt denies vomiting but does endorse some diarrhea, states this is "normal." Pt denies bloody stools. Denies fever and night sweats. Denies weakness. States he feels well otherwise. RN scheduled pt for Wednesday next week, pt states he does not drive d/t Parkinson's and would be unable to come in earlier than Wednesday d/t needing to arrange transport.  RN advised pt if he develops CP, SOB, weakness, dizziness, vomiting, or bloody stools he needs to go  to the ED. Pt verbalized understanding.    Reason for Disposition  [1] Continued weight loss AND [2] after medical evaluation by doctor (or NP/PA)  Answer Assessment - Initial Assessment Questions 1. MAIN CONCERN: "What is your main concern today?"     Weight loss  2. WEIGHT LOSS: "How much weight have you lost?"  (e.g., lbs., kgs.)  "Over what period of time have you lost this weight?"  (e.g., number of days, weeks, months, years)     10 lbs in a couple months 3. BASELINE WEIGHT: "What is your baseline or normal weight?" (e.g., "How much do you usually weigh?")     170-175, weighs 160 today 4. CAUSE: "What do you think is causing the weight loss?" (e.g., depression, anxiety, medicine  side effect, pain, trouble swallowing, substance or alcohol use problem, eating disorder)     Eating normally. "I have a lot going on medically and personally", pt states he lives with his girlfriend 5. PRIOR EVALUATION: "Have you been evaluated by a doctor for your weight loss?" If Yes, ask "When was your last visit?" "What did your doctor (or NP/PA) tell you about the possible cause?"     Yes, 5/1 seen oncologist 6. HEART FAILURE TREATMENT: "Do you have heart failure?" If Yes, ask: "Have you taken new or extra water pills (diuretics) recently?" (e.g., furosemide; bumetanide). "What is your target weight?"     No - cancer  7. OTHER SYMPTOMS: "Do you have any other symptoms?" (e.g., anxiety or depression, blood in stool, breathing difficulty, diarrhea, fever, trouble swallowing)     Endorses he is hungry, eating and drinking normally. "Little diarrhea", no vomiting, no abdominal pain. "Feel fine otherwise."   Denies bloody stools. "I've got back problems, I had needles stuck in me."  Protocols used: Weight Loss - Unintended-A-AH

## 2024-04-25 ENCOUNTER — Other Ambulatory Visit: Payer: Self-pay | Admitting: Neurology

## 2024-04-25 DIAGNOSIS — G20B2 Parkinson's disease with dyskinesia, with fluctuations: Secondary | ICD-10-CM

## 2024-04-27 NOTE — Progress Notes (Unsigned)
 [facility] at [facility] [address] [place], [place] [address] [phone]  Date:  04/29/2024   Name:  Brian [name]   DOB:  [birth year]   MRN:  [id number]  PCP:  Kaylee Partridge, MD    Chief Complaint: Weight Loss (Onset "back in march" /Diarrhea once in awhile, appetite has been good )   History of Present Illness:  Brian [name] is a 63 y.o. very pleasant male patient who presents with the following:  Patient seen today with concern of weight loss.  Most recent visit myself was in [place] that time his main concern was worsening of his back pain.  I provided prescription for tramadol   He has history of Hodgkin's disease unfortunately status post multiple recurrences, Parkinson's disease, hypertension, prediabetes, history of DVT, chronic back pain From a Hodgkin's disease standpoint he had a stem cell transplant in 2014, then had a recurrence.  He had CAR-T therapy at [facility] in 2017 and did well until another relapse in his spine, subsequent radiation treatment.  He had another recurrence in 2024 and did 6 rounds of chemotherapy, right now cancer appears to be quiet Has known history of degenerative spine disease, did have a microdiscectomy years ago  He is seeing Dr Donna Fus at [facility] ortho- they recently did an RFA -we think it is helping but he is still in the process  He was seen by his primary oncologist, Dr. Maria Shiner about a month ago-at that time his main concern was also weight loss.  Dr. Maria Shiner felt weight loss was likely related to Parkinson's disease.  He did start him on a low-dose of Marinol  in hopes of stimulating his appetite.  Unfortunately this has not helped all that much  His neurologist is Dr Tat, Most recent visit was in February: 1.  Parkinsons Disease             -Continue carbidopa /levodopa  25/100 and take 2 tablets at 7 AM, 2 tablets at 11 AM, 2 tablet at 3 PM, 2 tablet at 7 PM.             -Continue  carbidopa /levodopa  50/200 at bedtime.             -Continue entacapone  200 mg, with first 3 dosages of levodopa  for wearing off.  R/b/se discussed.             -Continue pramipexole  0.5 mg 3 times per day.  Higher dosages caused hallucinations.             -will consider inbrija  in the future             -Patient sought second opinion at [facility] with Dr. Penny Boxer in May, 2023.  There was no significant changes in medication.             -I would like to consider Vyalev in this pt.  We had a long discussion about this medication.  He was shown the pump.  We talked about how to use it and how it might help smooth out the on/off time.  Patient has bradykinesia and dyskinesia at this visit.  Patient is really nervous about the size of the pump.  He and I talked about how to deal with that in real life.  He wants to think about his options, and he stated that he would give us  feedback in the next 2 weeks. 2.  History of cranial nerve VII palsy             -  Stable.  Patient has synkinetic reinnervation of the right face. 3.  Hodgkin's lymphoma             -PET scan in November, 2023 with evidence of recurrence again.  He has finished his chemotherapy again and is trying to regain strength 4.  Renal insufficiency             -monitoring.  Mild.  This is actually improved compared to previous. 5.  Lumbar radiculopathy             -Patient had microdiscectomy Mar 29, 2021             -Microdiscectomy complicated by subdural CSF fluid collection at L1 vertebral body and L5-S1 level.  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             -Patient seen by [facility] neurology and [facility] neurosurgery.               -ambulation is affected by continued pain.  He just had epidurals yesterday for his back. 6.  DVT             -Developed while on chemo and was weak and now on Xarelto .              -Patient is obviously a fall risk   He did a PET on 03/24/24- this was clear Sinemet  Marinol  Entacapone   Lorazepam  as needed Mirapex  3 times daily Xarelto  10  Shingrix Recommend pneumonia vaccine He has completed RSV  He notes weight loss starting in about March of this year  He is eating plenty -he does not feel like he has reduced his calorie intake They are trying to pump up his calories with higher fat foods like peanut butter Wt Readings from Last 3 Encounters:  04/29/24 164 lb 9.6 oz (74.7 kg)  03/26/24 167 lb (75.8 kg)  01/23/24 174 lb 1.9 oz (79 kg)  He has lost about 10 lbs-weight seems to have stabilized now He is trying to use the marinol  once daily  He has to get up an urinate several times during the night, and he has some odd dreams   His Parkinson's is getting worse and with that he has increased dyskinesia.  He certainly could be burning more calories through dyskinetic movement  Here today with his partner Brian [name] who supplements the history  Patient Active Problem List   Diagnosis Date Noted   Prediabetes 09/17/2023   Right shoulder pain 06/18/2023   Acute deep vein thrombosis (DVT) of proximal vein of right lower extremity (HCC) 06/18/2023   Malabsorption of iron  03/01/2023   Primary hypertension 09/12/2022   Closed fracture of right proximal humerus 10/03/2021   Traumatic complete tear of right rotator cuff 10/03/2021   Lumbar radiculopathy 10/03/2021   Goals of care, counseling/discussion 09/25/2018   Mass of right chest wall 09/18/2018   PD (Parkinson's disease) (HCC) 11/27/2016   Lymphadenopathy 07/28/2015   Mediastinal adenopathy 07/14/2015   Aphasia 03/04/2015   Slurred speech 03/04/2015   Pleural mass 05/31/2014   Abdominal pain, epigastric 02/02/2014   Weight loss 08/12/2012   H/O Bell's palsy 08/12/2012   Hodgkin's disease 08/09/2011    Past Medical History:  Diagnosis Date   Anxiety    Dysrhythmia    past hx pvc   Goals of  care, counseling/discussion 09/25/2018  H/O autologous stem cell transplant (HCC)    may 2014  at [facility]   History of Bell's palsy    2009  RIGHT SIDE--  HAS 80% FUNCTION / PT STATES A LITTLE ASYMETRICAL AND EFFECTS MOUTH   History of radiation therapy 10/18/17-11/05/17   sprine T1 26 Gy in 13 fractions, spine boost 10 Gy in 5 fractions   History of radiation therapy    Right hip, Rt Sclav- 07/23/22-08/07/11- Dr. Retta Caster   Hodgkin's disease, nodular sclerosis, of inguinal region/lower limb (HCC) ONOLOGIST--  DR Maria Shiner AND A [facility]     SALVAGE CHEMO 2013/  AUTOLOGUS STEM CELL TRANSPLANT MAY 2014 AT [facility]   Hypertension    Malabsorption of iron  03/01/2023   Mass of right chest wall 09/18/2018   Mass of right inguinal region    Neuromuscular disorder (HCC)    bilateral feet neuropathy   PVC (premature ventricular contraction)    "benign"   TIA (transient ischemic attack) 02/2015   "probable TIA"   Wears contact lenses     Past Surgical History:  Procedure Laterality Date   AXILLARY LYMPH NODE BIOPSY Left 02/02/2013   Procedure: NEEDLE LOCALIZED AXILLARY LYMPH NODE BIOPSY;  Surgeon: Darcella Earnest, MD;  Location: [facility];  Service: General;  Laterality: Left;   BACK SURGERY  03/2021   BONE MARROW BIOPSY  08/26/2012   COLONOSCOPY  2021   CYST REMOVAL NECK Left 11/26/1978   IR IMAGING GUIDED PORT INSERTION  10/23/2022   IR REMOVAL TUN ACCESS W/ PORT W/O FL MOD SED  04/09/2024   LEFT INGUINAL LYMPH NODE BX  09/09/2012   LYMPH NODE BIOPSY N/A 07/28/2015   Procedure: LYMPH NODE BIOPSY;  Surgeon: Zelphia Higashi, MD;  Location: [facility] OR;  Service: Thoracic;  Laterality: N/A;   LYMPH NODE BIOPSY Right 06/29/2022   Procedure: RIGHT AXILLARY NODE BIOPSY;  Surgeon: Zelphia Higashi, MD;  Location: [facility] OR;  Service: Vascular;  Laterality: Right;   MASS EXCISION Right 09/19/2018   Procedure: EXCISION OF RIGHT CHEST WALL MASS ERAS PATHWAY;  Surgeon: Boyce Byes,  MD;  Location: [facility];  Service: General;  Laterality: Right;   NODE DISSECTION Right 07/28/2015   Procedure: NODE DISSECTION;  Surgeon: Zelphia Higashi, MD;  Location: [facility] OR;  Service: Thoracic;  Laterality: Right;   PLEURA BIOPSY Left 05/31/2014   REMOVAL RIGHT INGUINAL LYMPH NODES  08/16/2011   SCROTAL EXPLORATION Right 01/04/2014   Procedure: SCROTUM EXPLORATION   INGUINAL , EXCISION OF CYSTIC MASS OF RIGHT SPERMATIC CORD, WITH FROZEN SECTION;  Surgeon: Trent Frizzle, MD;  Location: [facility];  Service: Urology;  Laterality: Right;   TRANSTHORACIC ECHOCARDIOGRAM  03/18/2013   MILD LVH/  EF 55-60%   VIDEO ASSISTED THORACOSCOPY Left 05/31/2014   Procedure: LEFT VIDEO ASSISTED THORACOSCOPY, PLEURAL BIOPSY;  Surgeon: Zelphia Higashi, MD;  Location: [facility] OR;  Service: Thoracic;  Laterality: Left;   VIDEO ASSISTED THORACOSCOPY Right 07/28/2015   Procedure: RIGHT VIDEO ASSISTED THORACOSCOPY;  Surgeon: Zelphia Higashi, MD;  Location: [facility] OR;  Service: Thoracic;  Laterality: Right;   VIDEO ASSISTED THORACOSCOPY (VATS)/ LYMPH NODE SAMPLING Right 07/28/2015   VIDEO ASSISTED THORACOSCOPY (VATS)/WEDGE RESECTION  05/31/2014   VIDEO BRONCHOSCOPY WITH ENDOBRONCHIAL ULTRASOUND  07/28/2015   VIDEO BRONCHOSCOPY WITH ENDOBRONCHIAL ULTRASOUND N/A 07/28/2015   Procedure: VIDEO BRONCHOSCOPY WITH ENDOBRONCHIAL ULTRASOUND;  Surgeon: Zelphia Higashi, MD;  Location: [facility] OR;  Service: Thoracic;  Laterality: N/A;    Social  History   Tobacco Use   Smoking status: Never   Smokeless tobacco: Never  Vaping Use   Vaping status: Never Used  Substance Use Topics   Alcohol use: Yes    Alcohol/week: 14.0 standard drinks of alcohol    Types: 7 Glasses of wine, 7 Cans of beer per week    Comment: one drink per night   Drug use: No    Family History  Problem Relation Age of Onset   Cancer Mother 14       ovarian   Ovarian cancer Mother 27   Heart disease  Father    Stroke Father    Cancer Brother        testicular   Hypertension Brother    Healthy Son    Healthy Son    Hypertension Brother    Other Brother        CMT   Hypertension Brother     No Known Allergies  Medication list has been reviewed and updated.  Current Outpatient Medications on File Prior to Visit  Medication Sig Dispense Refill   ascorbic acid (VITAMIN C) 500 MG tablet Take 500 mg by mouth daily.     carbidopa -levodopa  (SINEMET  CR) 50-200 MG tablet TAKE 1 TABLET BY MOUTH AT BEDTIME 90 tablet 0   carbidopa -levodopa  (SINEMET  IR) 25-100 MG tablet TAKE 2 TABLETS BY MOUTH AT 7AM, 2 TABLETS AT 11AM, 2 TABLETS AT 3PM AND 2 TABLETS AT 7PM 720 tablet 0   cholecalciferol  (VITAMIN D3) 25 MCG (1000 UNIT) tablet Take 1,000 Units by mouth daily.     diphenhydramine -acetaminophen  (TYLENOL  PM) 25-500 MG TABS tablet Take 1 tablet by mouth at bedtime as needed.     dronabinol  (MARINOL ) 5 MG capsule Take 1 capsule (5 mg total) by mouth 2 (two) times daily before a meal. 60 capsule 0   entacapone  (COMTAN ) 200 MG tablet TAKE 1 TABLET BY MOUTH THREE TIMES DAILY WITH FIRST 3 DOSAGES OF LEVADOPA 270 tablet 0   LORazepam  (ATIVAN ) 0.5 MG tablet TAKE 1 TABLET(0.5 MG) BY MOUTH EVERY 6 HOURS AS NEEDED FOR ANXIETY 20 tablet 1   pramipexole  (MIRAPEX ) 0.5 MG tablet TAKE 1 TABLET(0.5 MG) BY MOUTH THREE TIMES DAILY 270 tablet 0   rivaroxaban  (XARELTO ) 10 MG TABS tablet Take 1 tablet (10 mg total) by mouth daily with supper. 90 tablet 3   Current Facility-Administered Medications on File Prior to Visit  Medication Dose Route Frequency Provider Last Rate Last Admin   sodium chloride  flush (NS) 0.9 % injection 10 mL  10 mL Intracatheter PRN Ivor Mars, MD   10 mL at 03/19/23 1338    Review of Systems:  As per HPI- otherwise negative.   Physical Examination: Vitals:   04/29/24 1017  BP: 98/64  Pulse: 83  SpO2: 96%   Vitals:   04/29/24 1017  Weight: 164 lb 9.6 oz (74.7 kg)  Height:  5\' 10"  (1.778 m)   Body mass index is 23.62 kg/m. Ideal Body Weight: Weight in (lb) to have BMI = 25: 173.9  GEN: no acute distress.  Dyskinetic movements typical of Parkinson's disease are present, otherwise looks well HEENT: Atraumatic, Normocephalic.  Ears and Nose: No external deformity. CV: RRR, No M/G/R. No JVD. No thrill. No extra heart sounds. PULM: CTA B, no wheezes, crackles, rhonchi. No retractions. No resp. distress. No accessory muscle use. ABD: S, NT, ND EXTR: No c/c/e PSYCH: Normally interactive. Conversant.   BP Readings from Last 3 Encounters:  04/29/24 98/64  03/26/24 136/88  02/03/24 126/66   Lab Results  Component Value Date   PSA1 2.1 03/26/2024   PSA 2.07 09/16/2023   PSA 2.7 10/16/2022   PSA 2.44 06/15/2020      Assessment and Plan: Weight loss - Plan: TSH  Nocturia - Plan: tadalafil (CIALIS) 5 MG tablet  Parkinson's disease with dyskinesia, unspecified whether manifestations fluctuate (HCC)  Hodgkin's disease  Patient seen today with concern of weight loss.  He does have history of Hodgkin's disease with complication and recurrence.  However, a recent PET scan was reassuring.  I will touch base with his oncologist Dr. Maria Shiner and inquire if any further imaging such as a CT chest abdomen pelvis would be potentially helpful We will check a thyroid  level Encouraged them to continue maximizing his calorie intake  Patient notes he has to get up many times at night, as many as 5 or 6 times to urinate.  He tried Flomax  but it was not helpful.  I prescribed Cialis in hopes of improving BPH symptoms and helping to reduce nocturia His PSA has been stable  Brian [name] will be seeing his neurologist, Dr.Tat tomorrow.  They will follow-up on his Parkinson disease symptoms  Signed Gates Kasal, MD  Received TSH as below, message to patient  Results for orders placed or performed in visit on 04/29/24  TSH   Collection Time: 04/29/24 10:54 AM  Result Value  Ref Range   TSH 5.99 (H) 0.35 - 5.50 uIU/mL   *Note: Due to a large number of results and/or encounters for the requested time period, some results have not been displayed. A complete set of results can be found in Results Review.

## 2024-04-27 NOTE — Progress Notes (Unsigned)
 Assessment/Plan:   1.  Parkinsons Disease  -Continue carbidopa /levodopa  25/100 and take 2 tablets at 7 AM, 2 tablets at 11 AM, 2 tablet at 3 PM, 2 tablet at 7 PM.             -Continue carbidopa /levodopa  50/200 at bedtime.  -discussed rytary /crexont but worry he won't be happy with it.  It would offer potentially more on time with less dyskinesia but I find that pts who are used to IR only don't feel that they move as well  -Continue entacapone  200 mg, with first 3 dosages of levodopa  for wearing off.  R/b/se discussed.             -Continue pramipexole  0.5 mg 3 times per day.  Higher dosages caused hallucinations.  -will consider inbrija  in the future  -Patient sought second opinion at [facility] with Dr. Penny [name] in May, 2023.  There was no significant changes in medication.  - Patient has declined treatment with Vyalev.  I think this would dramatically change his quality of life, offering significantly more on time with less dyskinesia, but patient is not ready for this step.  - We did discuss Onapgo today.  He was shown the pump.  This is also an infusion pump, but this is smaller and only worn 16 hours/day.  It is an apomorphine pump.  He is going to think about it but isn't excited about it.    -discussed DBS today in detail.  Discussed that this would likely help his dyskinesia/motor fluctuations and we could likely lower levodopa  dosage.  He was given information.   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  Patient has synkinetic reinnervation of the right face.   3.  Hodgkin's lymphoma             -PET scan in November, 2023 with evidence of recurrence and had chemo again but back in remission and port removed again   4.  Renal insufficiency             -monitoring.  Mild.  This is actually improved compared to previous.   5.  Lumbar radiculopathy             -Patient had microdiscectomy Mar 29, 2021  -Microdiscectomy complicated by subdural CSF fluid  collection at L1 vertebral body and L5-S1 level.  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  -Patient seen by [facility] neurology and [facility] neurosurgery.    -ambulation is affected by continued pain.  He just had rhizotomy with Dr. [facility] ortho yesterday  6.  DVT  -Developed while on chemo and was weak and now on Xarelto .  -Patient is obviously a fall risk and we will need to monitor.  7.  Weight loss  - Treatment via oncology with Marinol  but he isn't really using   -dyskinesia could contribute  -discussed protein supplements  8.  Near syncope  -increase water intake.  He's self dehydrating due to urinary frequency Subjective:   Brian [name] was seen today in follow up for Parkinsons disease.  My previous records were reviewed prior to todays visit as well as outside records available to me.  Pt with Brian [name] who supplements the hx. Spoke with pcp about his case, whom he saw  yesterday.  patient has been following with oncology.  He has been losing weight, so they started him on Marinol  but not taking as makes him sleepy.  When I saw the patient last visit, we offered him a brand-new treatment of Vyalev (infusion of foscarbidopa/foslevodopa) but patient ultimately declined.  Some weight loss.  Working out more though.  Noting near syncope if stands up in heat.    Current prescribed movement disorder medications: carbidopa /levodopa  25/100, 2 tablets at 7 AM, 2 tablets at 11 AM, 2 tablet at 3 PM, 2 tablet at 7 PM  carbidopa /levodopa  50/200 CR q hs  Pramipexole  0.5 mg 3 times per day  Entacapone , 200 mg 3 times per day  PREVIOUS MEDICATIONS: carbidopa /levodopa  50/200 CR (d/c just because tried it for pain and still waking up with pain in back so d/c it); amantadine  (he didn't want to take it and he stopped it - didn't think that dyskinesia was an issue); gabapentin   without help for PN in past  ALLERGIES:  No Known Allergies  CURRENT MEDICATIONS:  Outpatient Encounter Medications as of 04/30/2024  Medication Sig   ascorbic acid (VITAMIN C) 500 MG tablet Take 500 mg by mouth daily.   carbidopa -levodopa  (SINEMET  CR) 50-200 MG tablet TAKE 1 TABLET BY MOUTH AT BEDTIME   carbidopa -levodopa  (SINEMET  IR) 25-100 MG tablet TAKE 2 TABLETS BY MOUTH AT 7AM, 2 TABLETS AT 11AM, 2 TABLETS AT 3PM AND 2 TABLETS AT 7PM   cholecalciferol  (VITAMIN D3) 25 MCG (1000 UNIT) tablet Take 1,000 Units by mouth daily.   diphenhydramine -acetaminophen  (TYLENOL  PM) 25-500 MG TABS tablet Take 1 tablet by mouth at bedtime as needed.   dronabinol  (MARINOL ) 5 MG capsule Take 1 capsule (5 mg total) by mouth 2 (two) times daily before a meal.   entacapone  (COMTAN ) 200 MG tablet TAKE 1 TABLET BY MOUTH THREE TIMES DAILY WITH FIRST 3 DOSAGES OF LEVADOPA   LORazepam  (ATIVAN ) 0.5 MG tablet TAKE 1 TABLET(0.5 MG) BY MOUTH EVERY 6 HOURS AS NEEDED FOR ANXIETY   pramipexole  (MIRAPEX ) 0.5 MG tablet TAKE 1 TABLET(0.5 MG) BY MOUTH THREE TIMES DAILY   rivaroxaban  (XARELTO ) 10 MG TABS tablet Take 1 tablet (10 mg total) by mouth daily with supper.   tadalafil (CIALIS) 5 MG tablet Take 1 tablet (5 mg total) by mouth daily.   [DISCONTINUED] pramipexole  (MIRAPEX ) 0.5 MG tablet TAKE 1 TABLET(0.5 MG) BY MOUTH THREE TIMES DAILY   Facility-Administered Encounter Medications as of 04/30/2024  Medication   sodium chloride  flush (NS) 0.9 % injection 10 mL    Objective:   PHYSICAL EXAMINATION:    VITALS:   Vitals:   04/30/24 0814  BP: 100/60  Pulse: 85  SpO2: 94%  Weight: 168 lb (76.2 kg)  Height: 5\' 10"  (1.778 m)   Wt Readings from Last 3 Encounters:  04/30/24 168 lb (76.2 kg)  04/29/24 164 lb 9.6 oz (74.7 kg)  03/26/24 167 lb (75.8 kg)     GEN:  The patient appears stated age and is in NAD. HEENT:  Normocephalic, atraumatic.  The mucous membranes are moist. The superficial temporal arteries are  without ropiness or tenderness. CV:  RRR Lungs:  CTAB Neck/HEME:  There are no carotid bruits bilaterally.  Neurological examination:  Orientation: The patient is alert and oriented x3. Cranial nerves: There is good facial symmetry (slight dec NL fold on R) with facial hypomimia. The speech is fluent and clear. Soft palate rises symmetrically and there is no tongue deviation. Hearing is intact to conversational  tone. Sensation: Sensation is intact to light touch throughout Motor: Strength is at least antigravity x4.  Movement examination: Tone: There is normal tone in the normal Abnormal movements: Patient has mild dyskinesia Coordination:  There is mild decremation only with foot taps on the L Gait and Station: The patient ambulated well today.  No significant freezing today  I have reviewed and interpreted the following labs independently    Chemistry      Component Value Date/Time   NA 140 03/26/2024 1213   NA 142 04/02/2017 0000   NA 140 07/19/2016 1305   K 4.5 03/26/2024 1213   K 4.4 07/19/2016 1305   CL 105 03/26/2024 1213   CL 105 02/08/2016 1117   CL 107 03/25/2013 0828   CO2 29 03/26/2024 1213   CO2 26 07/19/2016 1305   BUN 37 (H) 03/26/2024 1213   BUN 21 04/02/2017 0000   BUN 22.8 07/19/2016 1305   CREATININE 1.31 (H) 03/26/2024 1213   CREATININE 1.1 07/19/2016 1305   GLU 105 04/02/2017 0000      Component Value Date/Time   CALCIUM  9.1 03/26/2024 1213   CALCIUM  9.8 07/19/2016 1305   ALKPHOS 88 03/26/2024 1213   ALKPHOS 76 07/19/2016 1305   AST 15 03/26/2024 1213   AST 18 07/19/2016 1305   ALT <5 03/26/2024 1213   ALT 14 07/19/2016 1305   BILITOT 0.5 03/26/2024 1213   BILITOT 0.70 07/19/2016 1305       Lab Results  Component Value Date   WBC 5.5 03/26/2024   HGB 11.8 (L) 03/26/2024   HCT 36.3 (L) 03/26/2024   MCV 98.6 03/26/2024   PLT 239 03/26/2024    Lab Results  Component Value Date   TSH 5.99 (H) 04/29/2024     Total time spent on  today's visit was 41 minutes, including both face-to-face time and nonface-to-face time.  Time included that spent on review of records (prior notes available to me/labs/imaging if pertinent), discussing treatment and goals, answering patient's questions and coordinating care.  Cc:  Copland, Skipper Dumas, MD

## 2024-04-27 NOTE — Patient Instructions (Incomplete)
 It was good to see you again today I will be in touch with your thyroid  level asap Let's try the daily low dose Cialis for your BPH- this may help with your getting up at night so much to urinate  If not helpful after 4-6 weeks you can stop using it

## 2024-04-29 ENCOUNTER — Telehealth: Payer: Self-pay

## 2024-04-29 ENCOUNTER — Encounter: Payer: Self-pay | Admitting: Family Medicine

## 2024-04-29 ENCOUNTER — Ambulatory Visit (INDEPENDENT_AMBULATORY_CARE_PROVIDER_SITE_OTHER): Admitting: Family Medicine

## 2024-04-29 ENCOUNTER — Other Ambulatory Visit (HOSPITAL_COMMUNITY): Payer: Self-pay

## 2024-04-29 VITALS — BP 98/64 | HR 83 | Ht 70.0 in | Wt 164.6 lb

## 2024-04-29 DIAGNOSIS — R634 Abnormal weight loss: Secondary | ICD-10-CM

## 2024-04-29 DIAGNOSIS — C8195 Hodgkin lymphoma, unspecified, lymph nodes of inguinal region and lower limb: Secondary | ICD-10-CM

## 2024-04-29 DIAGNOSIS — R7989 Other specified abnormal findings of blood chemistry: Secondary | ICD-10-CM

## 2024-04-29 DIAGNOSIS — G20B1 Parkinson's disease with dyskinesia, without mention of fluctuations: Secondary | ICD-10-CM | POA: Diagnosis not present

## 2024-04-29 DIAGNOSIS — R351 Nocturia: Secondary | ICD-10-CM | POA: Diagnosis not present

## 2024-04-29 LAB — TSH: TSH: 5.99 u[IU]/mL — ABNORMAL HIGH (ref 0.35–5.50)

## 2024-04-29 MED ORDER — TADALAFIL 5 MG PO TABS: 5.0000 mg | ORAL_TABLET | Freq: Every day | ORAL | 11 refills | Status: AC

## 2024-04-29 NOTE — Telephone Encounter (Signed)
 Pharmacy Patient Advocate Encounter   Received notification from CoverMyMeds that prior authorization for Tadalafil 5MG  tablets  is required/requested.   Insurance verification completed.   The patient is insured through [employer] .   Per test claim: PA required; PA started via CoverMyMeds. KEY [id number] . Waiting for clinical questions to populate.

## 2024-04-30 ENCOUNTER — Encounter: Payer: Self-pay | Admitting: Neurology

## 2024-04-30 ENCOUNTER — Ambulatory Visit (INDEPENDENT_AMBULATORY_CARE_PROVIDER_SITE_OTHER): Payer: Commercial Managed Care - PPO | Admitting: Neurology

## 2024-04-30 VITALS — BP 100/60 | HR 85 | Ht 70.0 in | Wt 168.0 lb

## 2024-04-30 DIAGNOSIS — R634 Abnormal weight loss: Secondary | ICD-10-CM | POA: Diagnosis not present

## 2024-04-30 DIAGNOSIS — G903 Multi-system degeneration of the autonomic nervous system: Secondary | ICD-10-CM

## 2024-04-30 DIAGNOSIS — G20B2 Parkinson's disease with dyskinesia, with fluctuations: Secondary | ICD-10-CM | POA: Diagnosis not present

## 2024-04-30 NOTE — Telephone Encounter (Signed)
 Called patient on his request to discuss his labs  I will know Dr. Maria Shiner does not think a CT scan for weight loss is helpful at this point because he just had a PET scan  We discussed his thyroid -his TSH has been minimally elevated a couple of times in the past.  I offered to start him on a low-dose of levothyroxine.  For the time being he would really prefer to recheck in 2 or 3 months before we start any medication.  I ordered a TSH and free T3 and asked him to have a lab visit only towards the end of the summer  Lab Results  Component Value Date   TSH 5.99 (H) 04/29/2024

## 2024-05-01 ENCOUNTER — Other Ambulatory Visit: Payer: Self-pay

## 2024-05-06 NOTE — Telephone Encounter (Signed)
 Pharmacy Patient Advocate Encounter   Received notification from CoverMyMeds that prior authorization for  TADALAFIL  5 MG TABLET is required/requested.   Insurance verification completed.   The patient is insured through [facility] .   Per test claim: PA required; PA submitted to above mentioned insurance via CoverMyMeds Key/confirmation #/EOC PA (EOC)ID [id number]  Status is pending

## 2024-05-07 NOTE — Telephone Encounter (Signed)
 Pharmacy Patient Advocate Encounter  Received notification from [facility] [employer] that Prior Authorization for TADALAFIL  5 MG TABLET  has been DENIED.  No reason given; No denial letter received via Fax or CMM. It has been requested and will be uploaded to the media tab once received.    PA #/Case ID/Reference #:[id number]

## 2024-05-07 NOTE — Telephone Encounter (Signed)
Denial letter is in media.

## 2024-05-13 ENCOUNTER — Other Ambulatory Visit: Payer: Self-pay | Admitting: Neurology

## 2024-05-13 DIAGNOSIS — G20A1 Parkinson's disease without dyskinesia, without mention of fluctuations: Secondary | ICD-10-CM

## 2024-05-20 ENCOUNTER — Telehealth: Payer: Self-pay

## 2024-05-20 NOTE — Telephone Encounter (Signed)
 Received voicemail from patient stating he has not received a call back after leaving a message a few days ago  This RN called patient and call went to voicemail. This RN left message for patient to call back along with clinic phone number.

## 2024-05-22 ENCOUNTER — Encounter: Payer: Self-pay | Admitting: Hematology & Oncology

## 2024-05-22 ENCOUNTER — Ambulatory Visit: Payer: Self-pay | Admitting: Hematology & Oncology

## 2024-05-22 ENCOUNTER — Other Ambulatory Visit: Payer: Self-pay

## 2024-05-22 ENCOUNTER — Inpatient Hospital Stay: Attending: Hematology & Oncology

## 2024-05-22 ENCOUNTER — Inpatient Hospital Stay

## 2024-05-22 ENCOUNTER — Inpatient Hospital Stay (HOSPITAL_BASED_OUTPATIENT_CLINIC_OR_DEPARTMENT_OTHER): Admitting: Hematology & Oncology

## 2024-05-22 VITALS — BP 96/64 | HR 87 | Temp 98.6°F | Wt 165.4 lb

## 2024-05-22 DIAGNOSIS — I824Y1 Acute embolism and thrombosis of unspecified deep veins of right proximal lower extremity: Secondary | ICD-10-CM | POA: Diagnosis not present

## 2024-05-22 DIAGNOSIS — C8195 Hodgkin lymphoma, unspecified, lymph nodes of inguinal region and lower limb: Secondary | ICD-10-CM

## 2024-05-22 DIAGNOSIS — R1013 Epigastric pain: Secondary | ICD-10-CM

## 2024-05-22 DIAGNOSIS — G20A1 Parkinson's disease without dyskinesia, without mention of fluctuations: Secondary | ICD-10-CM | POA: Diagnosis not present

## 2024-05-22 DIAGNOSIS — Z923 Personal history of irradiation: Secondary | ICD-10-CM | POA: Diagnosis not present

## 2024-05-22 DIAGNOSIS — Z79899 Other long term (current) drug therapy: Secondary | ICD-10-CM | POA: Insufficient documentation

## 2024-05-22 DIAGNOSIS — Z9484 Stem cells transplant status: Secondary | ICD-10-CM | POA: Diagnosis not present

## 2024-05-22 DIAGNOSIS — D509 Iron deficiency anemia, unspecified: Secondary | ICD-10-CM | POA: Insufficient documentation

## 2024-05-22 DIAGNOSIS — R634 Abnormal weight loss: Secondary | ICD-10-CM

## 2024-05-22 LAB — CMP (CANCER CENTER ONLY)
ALT: <5 U/L (ref 0–44)
AST: 19 U/L (ref 15–41)
Albumin: 4.4 g/dL (ref 3.5–5.0)
Alkaline Phosphatase: 96 U/L (ref 38–126)
Anion gap: 7 (ref 5–15)
BUN: 44 mg/dL — ABNORMAL HIGH (ref 8–23)
CO2: 27 mmol/L (ref 22–32)
Calcium: 9.3 mg/dL (ref 8.9–10.3)
Chloride: 106 mmol/L (ref 98–111)
Creatinine: 1.45 mg/dL — ABNORMAL HIGH (ref 0.61–1.24)
GFR, Estimated: 54 mL/min — ABNORMAL LOW (ref >60)
Glucose, Bld: 98 mg/dL (ref 70–99)
Potassium: 4.6 mmol/L (ref 3.5–5.1)
Sodium: 140 mmol/L (ref 135–145)
Total Bilirubin: 0.6 mg/dL (ref 0.0–1.2)
Total Protein: 6 g/dL — ABNORMAL LOW (ref 6.5–8.1)

## 2024-05-22 LAB — CBC WITH DIFFERENTIAL (CANCER CENTER ONLY)
Abs Immature Granulocytes: 0.07 10*3/uL (ref 0.00–0.07)
Basophils Absolute: 0 10*3/uL (ref 0.0–0.1)
Basophils Relative: 1 %
Eosinophils Absolute: 0.1 10*3/uL (ref 0.0–0.5)
Eosinophils Relative: 2 %
HCT: 37.1 % — ABNORMAL LOW (ref 39.0–52.0)
Hemoglobin: 12.1 g/dL — ABNORMAL LOW (ref 13.0–17.0)
Immature Granulocytes: 1 %
Lymphocytes Relative: 13 %
Lymphs Abs: 0.7 10*3/uL (ref 0.7–4.0)
MCH: 32 pg (ref 26.0–34.0)
MCHC: 32.6 g/dL (ref 30.0–36.0)
MCV: 98.1 fL (ref 80.0–100.0)
Monocytes Absolute: 0.7 10*3/uL (ref 0.1–1.0)
Monocytes Relative: 13 %
Neutro Abs: 4 10*3/uL (ref 1.7–7.7)
Neutrophils Relative %: 70 %
Platelet Count: 231 10*3/uL (ref 150–400)
RBC: 3.78 MIL/uL — ABNORMAL LOW (ref 4.22–5.81)
RDW: 13.2 % (ref 11.5–15.5)
WBC Count: 5.7 10*3/uL (ref 4.0–10.5)
nRBC: 0 % (ref 0.0–0.2)

## 2024-05-22 LAB — IRON AND IRON BINDING CAPACITY (CC-WL,HP ONLY)
Iron: 69 ug/dL (ref 45–182)
Saturation Ratios: 20 % (ref 17.9–39.5)
TIBC: 351 ug/dL (ref 250–450)
UIBC: 282 ug/dL (ref 117–376)

## 2024-05-22 LAB — RETICULOCYTES
Immature Retic Fract: 14.1 % (ref 2.3–15.9)
RBC.: 3.8 MIL/uL — ABNORMAL LOW (ref 4.22–5.81)
Retic Count, Absolute: 55.9 10*3/uL (ref 19.0–186.0)
Retic Ct Pct: 1.5 % (ref 0.4–3.1)

## 2024-05-22 LAB — FERRITIN: Ferritin: 167 ng/mL (ref 24–336)

## 2024-05-22 NOTE — Progress Notes (Signed)
 Hematology and Oncology Follow Up Visit  JARETH PARDEE [id number] 1961/05/08 63 y.o. 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 daily -start on 05/29/2023 - changed to 10 mg po q day on 03/26/2024 -patient DC'd this on 05/10/2024     Interim History:  Mr.  Zalewski is in for follow-up.  Thankfully, he has gained a little bit of weight.  He is eating a little bit better.  Again, I suspect that the weight loss is from his Parkinson's and because of all the movement that he has had with the Parkinson's.  He has been having some falls.  Has little bit of a hematoma on the left forearm.  He has had no problems with this prior to the iron  deficiency.  His hemoglobin has been trending upward.  There is been no problems with nausea or vomiting.  He has had no cough or shortness of breath.  He has had no rash.  There has been no leg swelling.  He now is off his Xarelto .  He wanted to stop the Xarelto .  I think he stopped the Xarelto  because he cannot afford the Xarelto  and did not think that he really needed this.  He has had no headache.  He has had no mouth sores.  He has had no sweats.  He has had no fever.  His iron  studies that were done back in May showed a ferritin 199 with iron  saturation of 20%.  Overall, I would say that his performance status probably ECOG 2.    Medications:  Current Outpatient Medications:    ascorbic acid (VITAMIN C) 500 MG tablet, Take 500 mg by mouth daily., Disp: , Rfl:    carbidopa -levodopa  (SINEMET  CR) 50-200 MG tablet, TAKE 1 TABLET BY  MOUTH AT BEDTIME, Disp: 90 tablet, Rfl: 0   carbidopa -levodopa  (SINEMET  IR) 25-100 MG tablet, TAKE 2 TABLETS BY MOUTH AT 7AM, 2 TABLETS AT 11AM, 2 TABLETS AT 3PM AND 2 TABLETS AT 7PM, Disp: 720 tablet, Rfl: 0   cholecalciferol  (VITAMIN D3) 25 MCG (1000 UNIT) tablet, Take 1,000 Units by mouth daily., Disp: , Rfl:    diphenhydramine -acetaminophen  (TYLENOL  PM) 25-500 MG TABS tablet, Take 1 tablet by mouth at bedtime as needed., Disp: , Rfl:    entacapone  (COMTAN ) 200 MG tablet, TAKE 1 TABLET BY MOUTH THREE TIMES DAILY WITH FIRST 3 DOSAGES OF LEVADOPA, Disp: 270 tablet, Rfl: 0   LORazepam  (ATIVAN ) 0.5 MG tablet, TAKE 1 TABLET(0.5 MG) BY MOUTH EVERY 6 HOURS AS NEEDED FOR ANXIETY, Disp: 20 tablet, Rfl: 1   pramipexole  (MIRAPEX ) 0.5 MG tablet, TAKE 1 TABLET(0.5 MG) BY MOUTH THREE TIMES DAILY, Disp: 270 tablet, Rfl: 0   rivaroxaban  (XARELTO ) 10 MG TABS tablet, Take 1 tablet (10 mg total) by mouth daily with supper., Disp: 90 tablet, Rfl: 3   tadalafil  (CIALIS ) 5 MG tablet, Take 1 tablet (5 mg total) by mouth daily., Disp: 30 tablet, Rfl: 11   dronabinol  (MARINOL ) 5 MG capsule, Take 1 capsule (5 mg total) by mouth 2 (two) times daily before a meal. (Patient not taking:  Reported on 05/22/2024), Disp: 60 capsule, Rfl: 0 No current facility-administered medications for this visit.  Facility-Administered Medications Ordered in Other Visits:    sodium chloride  flush (NS) 0.9 % injection 10 mL, 10 mL, Intracatheter, PRN, Axiel Fjeld R, MD, 10 mL at 03/19/23 1338  Allergies: No Known Allergies  Past Medical History, Surgical history, Social history, and Family History were reviewed and updated.  Review of Systems: Review of Systems  Neurological:  Positive for tremors.  All other systems reviewed and are negative.    Physical Exam:  weight is 165 lb 6.4 oz (75 kg). His oral temperature is 98.6 F (37 C). His blood pressure is 96/64 and his pulse is 87. His oxygen saturation is 96%.   Physical  Exam Vitals reviewed.  HENT:     Head: Normocephalic and atraumatic.   Eyes:     Pupils: Pupils are equal, round, and reactive to light.    Cardiovascular:     Rate and Rhythm: Normal rate and regular rhythm.     Heart sounds: Normal heart sounds.  Pulmonary:     Effort: Pulmonary effort is normal.     Breath sounds: Normal breath sounds.  Abdominal:     General: Bowel sounds are normal.     Palpations: Abdomen is soft.   Musculoskeletal:        General: No tenderness or deformity. Normal range of motion.     Cervical back: Normal range of motion.     Comments: There may be a little bit of swelling in the right lower leg.  I cannot palpate any obvious venous cord.  There may be a questionable Homans' sign.  Lymphadenopathy:     Cervical: No cervical adenopathy.   Skin:    General: Skin is warm and dry.     Findings: No erythema or rash.   Neurological:     Mental Status: He is alert and oriented to person, place, and time.   Psychiatric:        Behavior: Behavior normal.        Thought Content: Thought content normal.        Judgment: Judgment normal.     Lab Results  Component Value Date   WBC 5.7 05/22/2024   HGB 12.1 (L) 05/22/2024   HCT 37.1 (L) 05/22/2024   MCV 98.1 05/22/2024   PLT 231 05/22/2024     Chemistry      Component Value Date/Time   NA 140 05/22/2024 1133   NA 142 04/02/2017 0000   NA 140 07/19/2016 1305   K 4.6 05/22/2024 1133   K 4.4 07/19/2016 1305   CL 106 05/22/2024 1133   CL 105 02/08/2016 1117   CL 107 03/25/2013 0828   CO2 27 05/22/2024 1133   CO2 26 07/19/2016 1305   BUN 44 (H) 05/22/2024 1133   BUN 21 04/02/2017 0000   BUN 22.8 07/19/2016 1305   CREATININE 1.45 (H) 05/22/2024 1133   CREATININE 1.1 07/19/2016 1305   GLU 105 04/02/2017 0000      Component Value Date/Time   CALCIUM  9.3 05/22/2024 1133   CALCIUM  9.8 07/19/2016 1305   ALKPHOS 96 05/22/2024 1133   ALKPHOS 76 07/19/2016 1305   AST 19 05/22/2024 1133   AST 18  07/19/2016 1305   ALT <5 05/22/2024 1133   ALT 14 07/19/2016 1305   BILITOT 0.6 05/22/2024 1133   BILITOT 0.70 07/19/2016 1305      Impression and Plan: Mr. Burnsworth is a 63 year old  gentleman.  He has history of recurrent Hodgkin's disease. 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 He then had a recurrence after this.  He did undergo CAR-T therapy at [facility].  I believe he had this in April 2017.  He did well with this.  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  This was proven by biopsy.  He had radiation therapy for this.  He subsequently has had another recurrence.  This was in the right axilla.  This was biopsy-proven.  He did undergo radiation therapy for this.  This was completed in September/2023.  Now, we have had another recurrence.  He completed 6 cycles of chemotherapy with ANVD in May 2024.  Thankfully, he is still in remission.  I really do not think we have to do another PET scan probably until the end of the year.  I know that the insurance has been a problem with respect to paying for this I do not want to burden Mr. Heinbaugh with additional cost.  Again I still think that the Parkinson's will dictate his prognosis and his quality of life.  I will plan to see him back right after Labor Day.  I think we can try to get him through the Summer.  His girlfriend is doing an incredible job with him.     Maude JONELLE Crease, MD 6/27/20251:02 PM

## 2024-05-26 DIAGNOSIS — M71322 Other bursal cyst, left elbow: Secondary | ICD-10-CM | POA: Diagnosis not present

## 2024-05-27 ENCOUNTER — Other Ambulatory Visit: Payer: Self-pay | Admitting: Neurology

## 2024-05-27 DIAGNOSIS — G20B2 Parkinson's disease with dyskinesia, with fluctuations: Secondary | ICD-10-CM

## 2024-06-02 ENCOUNTER — Encounter: Payer: Self-pay | Admitting: Hematology & Oncology

## 2024-06-17 DIAGNOSIS — L820 Inflamed seborrheic keratosis: Secondary | ICD-10-CM | POA: Diagnosis not present

## 2024-06-19 ENCOUNTER — Other Ambulatory Visit: Payer: Self-pay | Admitting: Neurology

## 2024-06-19 DIAGNOSIS — G20B2 Parkinson's disease with dyskinesia, with fluctuations: Secondary | ICD-10-CM

## 2024-07-07 ENCOUNTER — Other Ambulatory Visit: Payer: Self-pay | Admitting: Family Medicine

## 2024-07-07 DIAGNOSIS — F419 Anxiety disorder, unspecified: Secondary | ICD-10-CM

## 2024-07-31 ENCOUNTER — Encounter: Payer: Self-pay | Admitting: Hematology & Oncology

## 2024-08-03 ENCOUNTER — Other Ambulatory Visit (HOSPITAL_BASED_OUTPATIENT_CLINIC_OR_DEPARTMENT_OTHER): Payer: Self-pay

## 2024-08-03 ENCOUNTER — Encounter: Payer: Self-pay | Admitting: Hematology & Oncology

## 2024-08-03 MED ORDER — FLUZONE 0.5 ML IM SUSY
0.5000 mL | PREFILLED_SYRINGE | Freq: Once | INTRAMUSCULAR | 0 refills | Status: AC
  Filled 2024-08-03: qty 0.5, 1d supply, fill #0

## 2024-08-07 ENCOUNTER — Inpatient Hospital Stay: Attending: Hematology & Oncology

## 2024-08-07 ENCOUNTER — Encounter: Payer: Self-pay | Admitting: Hematology & Oncology

## 2024-08-07 ENCOUNTER — Inpatient Hospital Stay (HOSPITAL_BASED_OUTPATIENT_CLINIC_OR_DEPARTMENT_OTHER): Admitting: Hematology & Oncology

## 2024-08-07 VITALS — BP 85/58 | HR 88 | Temp 97.8°F | Resp 19 | Ht 70.0 in | Wt 166.1 lb

## 2024-08-07 DIAGNOSIS — Z923 Personal history of irradiation: Secondary | ICD-10-CM | POA: Insufficient documentation

## 2024-08-07 DIAGNOSIS — I824Y1 Acute embolism and thrombosis of unspecified deep veins of right proximal lower extremity: Secondary | ICD-10-CM

## 2024-08-07 DIAGNOSIS — G20A1 Parkinson's disease without dyskinesia, without mention of fluctuations: Secondary | ICD-10-CM | POA: Insufficient documentation

## 2024-08-07 DIAGNOSIS — C8195 Hodgkin lymphoma, unspecified, lymph nodes of inguinal region and lower limb: Secondary | ICD-10-CM

## 2024-08-07 DIAGNOSIS — Z9484 Stem cells transplant status: Secondary | ICD-10-CM | POA: Diagnosis not present

## 2024-08-07 DIAGNOSIS — D509 Iron deficiency anemia, unspecified: Secondary | ICD-10-CM | POA: Diagnosis not present

## 2024-08-07 DIAGNOSIS — Z9221 Personal history of antineoplastic chemotherapy: Secondary | ICD-10-CM | POA: Insufficient documentation

## 2024-08-07 LAB — IRON AND IRON BINDING CAPACITY (CC-WL,HP ONLY)
Iron: 92 ug/dL (ref 45–182)
Saturation Ratios: 25 % (ref 17.9–39.5)
TIBC: 371 ug/dL (ref 250–450)
UIBC: 279 ug/dL

## 2024-08-07 LAB — CMP (CANCER CENTER ONLY)
ALT: 7 U/L (ref 0–44)
AST: 22 U/L (ref 15–41)
Albumin: 4.4 g/dL (ref 3.5–5.0)
Alkaline Phosphatase: 98 U/L (ref 38–126)
Anion gap: 9 (ref 5–15)
BUN: 35 mg/dL — ABNORMAL HIGH (ref 8–23)
CO2: 26 mmol/L (ref 22–32)
Calcium: 9.4 mg/dL (ref 8.9–10.3)
Chloride: 107 mmol/L (ref 98–111)
Creatinine: 1.29 mg/dL — ABNORMAL HIGH (ref 0.61–1.24)
GFR, Estimated: >60 mL/min (ref >60)
Glucose, Bld: 99 mg/dL (ref 70–99)
Potassium: 5 mmol/L (ref 3.5–5.1)
Sodium: 142 mmol/L (ref 135–145)
Total Bilirubin: 0.5 mg/dL (ref 0.0–1.2)
Total Protein: 6.4 g/dL — ABNORMAL LOW (ref 6.5–8.1)

## 2024-08-07 LAB — CBC WITH DIFFERENTIAL (CANCER CENTER ONLY)
Abs Immature Granulocytes: 0.01 K/uL (ref 0.00–0.07)
Basophils Absolute: 0.1 K/uL (ref 0.0–0.1)
Basophils Relative: 1 %
Eosinophils Absolute: 0.2 K/uL (ref 0.0–0.5)
Eosinophils Relative: 4 %
HCT: 37.2 % — ABNORMAL LOW (ref 39.0–52.0)
Hemoglobin: 12.3 g/dL — ABNORMAL LOW (ref 13.0–17.0)
Immature Granulocytes: 0 %
Lymphocytes Relative: 14 %
Lymphs Abs: 0.8 K/uL (ref 0.7–4.0)
MCH: 32.9 pg (ref 26.0–34.0)
MCHC: 33.1 g/dL (ref 30.0–36.0)
MCV: 99.5 fL (ref 80.0–100.0)
Monocytes Absolute: 0.7 K/uL (ref 0.1–1.0)
Monocytes Relative: 13 %
Neutro Abs: 3.8 K/uL (ref 1.7–7.7)
Neutrophils Relative %: 68 %
Platelet Count: 225 K/uL (ref 150–400)
RBC: 3.74 MIL/uL — ABNORMAL LOW (ref 4.22–5.81)
RDW: 13.1 % (ref 11.5–15.5)
WBC Count: 5.7 K/uL (ref 4.0–10.5)
nRBC: 0 % (ref 0.0–0.2)

## 2024-08-07 LAB — FERRITIN: Ferritin: 131 ng/mL (ref 24–336)

## 2024-08-07 LAB — LACTATE DEHYDROGENASE: LDH: 241 U/L — ABNORMAL HIGH (ref 98–192)

## 2024-08-07 NOTE — Progress Notes (Signed)
 Hematology and Oncology Follow Up Visit  Brian [name] [id number] August 14, 1961 63 y.o. 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 daily -start on 05/29/2023 - changed to 10 mg po q day on 03/26/2024 -patient DC'd this on 05/10/2024     Interim History:  [name] is in for follow-up.  He does look quite good.  He is still dealing with the Parkinson's.  However, he seems to be managing that pretty well.  He is still trying to exercise.  I gave him a lot of credit for being able to exercise.  His appetite is quite good.  His weight is about the same.  He has had no problems with bowels or bladder.  He has had no nausea or vomiting.  He has had no rashes.  There is been no leg swelling.  He has had no fever.  Thankfully, there is been no issues with COVID.  He continues to be off his Xarelto .  He has had no issues with respect to recurrence of thromboembolism.  His iron  studies today show a ferritin of 131 with an iron  saturation of 25%.  He has had no bleeding.  We probably need to set him up with a PET scan sometime either during the Holiday season or right afterwards.  Currently, I would have to say that his performance status is probably ECOG 1.   Medications:  Current Outpatient Medications:    ascorbic acid (VITAMIN C) 500 MG tablet, Take 500 mg by mouth daily., Disp: , Rfl:    carbidopa -levodopa  (SINEMET  CR) 50-200 MG tablet, TAKE 1 TABLET BY MOUTH AT BEDTIME, Disp: 90 tablet, Rfl: 0   carbidopa -levodopa  (SINEMET  IR) 25-100 MG tablet, TAKE 2 TABLETS BY MOUTH  AT 7AM, 2 TABLETS AT 11AM, 2 TABLETS AT 3PM AND 2 TABLETS AT 7PM, Disp: 720 tablet, Rfl: 0   cholecalciferol  (VITAMIN D3) 25 MCG (1000 UNIT) tablet, Take 1,000 Units by mouth daily., Disp: , Rfl:    diphenhydramine -acetaminophen  (TYLENOL  PM) 25-500 MG TABS tablet, Take 1 tablet by mouth at bedtime as needed., Disp: , Rfl:    entacapone  (COMTAN ) 200 MG tablet, TAKE 1 TABLET BY MOUTH THREE TIMES DAILY WITH FIRST 3 DOSAGES OF LEVADOPA, Disp: 270 tablet, Rfl: 0   LORazepam  (ATIVAN ) 0.5 MG tablet, TAKE 1 TABLET(0.5 MG) BY MOUTH EVERY 6 HOURS AS NEEDED FOR ANXIETY, Disp: 20 tablet, Rfl: 1   pramipexole  (MIRAPEX ) 0.5 MG tablet, TAKE 1 TABLET(0.5 MG) BY MOUTH THREE TIMES DAILY, Disp: 270 tablet, Rfl: 0   tadalafil  (CIALIS ) 5 MG tablet, Take 1 tablet (5 mg total) by mouth daily., Disp: 30 tablet, Rfl: 11 No current facility-administered medications for this visit.  Facility-Administered Medications Ordered in Other Visits:    sodium chloride  flush (NS) 0.9 % injection 10 mL, 10 mL, Intracatheter, PRN, Brian [name] R, MD, 10 mL at 03/19/23 1338  Allergies: No Known Allergies  Past Medical History, Surgical history, Social history, and Family History were reviewed and updated.  Review of Systems: Review of Systems  Neurological:  Positive for tremors.  All other  systems reviewed and are negative.    Physical Exam:  height is 5' 10 (1.778 m) and weight is 166 lb 1.9 oz (75.4 kg). His oral temperature is 97.8 F (36.6 C). His blood pressure is 85/58 (abnormal) and his pulse is 88. His respiration is 19 and oxygen saturation is 98%.   Physical Exam Vitals reviewed.  HENT:     Head: Normocephalic and atraumatic.  Eyes:     Pupils: Pupils are equal, round, and reactive to light.  Cardiovascular:     Rate and Rhythm: Normal rate and regular rhythm.     Heart sounds: Normal heart sounds.  Pulmonary:     Effort: Pulmonary effort is normal.     Breath sounds: Normal breath sounds.  Abdominal:      General: Bowel sounds are normal.     Palpations: Abdomen is soft.  Musculoskeletal:        General: No tenderness or deformity. Normal range of motion.     Cervical back: Normal range of motion.     Comments: There may be a little bit of swelling in the right lower leg.  I cannot palpate any obvious venous cord.  There may be a questionable Homans' sign.  Lymphadenopathy:     Cervical: No cervical adenopathy.  Skin:    General: Skin is warm and dry.     Findings: No erythema or rash.  Neurological:     Mental Status: He is alert and oriented to person, place, and time.  Psychiatric:        Behavior: Behavior normal.        Thought Content: Thought content normal.        Judgment: Judgment normal.     Lab Results  Component Value Date   WBC 5.7 08/07/2024   HGB 12.3 (L) 08/07/2024   HCT 37.2 (L) 08/07/2024   MCV 99.5 08/07/2024   PLT 225 08/07/2024     Chemistry      Component Value Date/Time   NA 142 08/07/2024 1203   NA 142 04/02/2017 0000   NA 140 07/19/2016 1305   K 5.0 08/07/2024 1203   K 4.4 07/19/2016 1305   CL 107 08/07/2024 1203   CL 105 02/08/2016 1117   CL 107 03/25/2013 0828   CO2 26 08/07/2024 1203   CO2 26 07/19/2016 1305   BUN 35 (H) 08/07/2024 1203   BUN 21 04/02/2017 0000   BUN 22.8 07/19/2016 1305   CREATININE 1.29 (H) 08/07/2024 1203   CREATININE 1.1 07/19/2016 1305   GLU 105 04/02/2017 0000      Component Value Date/Time   CALCIUM  9.4 08/07/2024 1203   CALCIUM  9.8 07/19/2016 1305   ALKPHOS 98 08/07/2024 1203   ALKPHOS 76 07/19/2016 1305   AST 22 08/07/2024 1203   AST 18 07/19/2016 1305   ALT 7 08/07/2024 1203   ALT 14 07/19/2016 1305   BILITOT 0.5 08/07/2024 1203   BILITOT 0.70 07/19/2016 1305      Impression and Plan: [name] is a 63 year old gentleman.  He has history of recurrent Hodgkin's disease. 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 He then had a recurrence after this.  He did undergo CAR-T  therapy at [facility].  I believe he had this in April 2017.  He did well with this.  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  This was proven by biopsy.  He had radiation therapy for this.  He subsequently has  had another recurrence.  This was in the right axilla.  This was biopsy-proven.  He did undergo radiation therapy for this.  This was completed in September/2023.  Now, we have had another recurrence.  He completed 6 cycles of chemotherapy with ANVD in May 2024.  Thankfully, he is still in remission.  I really do not think we have to do another PET scan probably until the end of the year.  I know that the insurance has been a problem with respect to paying for this I do not want to burden Mr. Ruddy with additional cost.  I am just happy that so far, there is been no evidence of recurrence.  He is doing quite well with the Parkinson's.  I had to give him and his girlfriend a lot of credit.  She really is doing a fantastic job with him.  She has a wonderful job helping to take care of him.  I will plan to get him back after the Holiday season.    Maude JONELLE Crease, MD 9/12/20254:45 PM

## 2024-08-08 ENCOUNTER — Ambulatory Visit: Payer: Self-pay | Admitting: Hematology & Oncology

## 2024-08-10 ENCOUNTER — Encounter: Payer: Self-pay | Admitting: Hematology & Oncology

## 2024-08-10 NOTE — Telephone Encounter (Signed)
 Advised via MyChart.

## 2024-08-10 NOTE — Telephone Encounter (Signed)
-----   Message from Maude JONELLE Crease sent at 08/08/2024  7:03 AM EDT ----- Please call and let him know that the iron  level is okay.  Thanks.SABRA ----- Message ----- From: Rebecka, [facility] [place] Sent: 08/07/2024  12:09 PM EDT To: Maude JONELLE Crease, MD

## 2024-08-12 ENCOUNTER — Ambulatory Visit (HOSPITAL_BASED_OUTPATIENT_CLINIC_OR_DEPARTMENT_OTHER)
Admission: RE | Admit: 2024-08-12 | Discharge: 2024-08-12 | Disposition: A | Discharge status: Home / Self Care | Source: Ambulatory Visit | Attending: Hematology & Oncology | Admitting: Hematology & Oncology

## 2024-08-12 ENCOUNTER — Encounter: Payer: Self-pay | Admitting: Hematology & Oncology

## 2024-08-12 ENCOUNTER — Other Ambulatory Visit (HOSPITAL_BASED_OUTPATIENT_CLINIC_OR_DEPARTMENT_OTHER)

## 2024-08-12 ENCOUNTER — Other Ambulatory Visit: Payer: Self-pay | Admitting: *Deleted

## 2024-08-12 DIAGNOSIS — I824Y1 Acute embolism and thrombosis of unspecified deep veins of right proximal lower extremity: Secondary | ICD-10-CM | POA: Insufficient documentation

## 2024-08-12 DIAGNOSIS — I82401 Acute embolism and thrombosis of unspecified deep veins of right lower extremity: Secondary | ICD-10-CM | POA: Diagnosis not present

## 2024-08-12 DIAGNOSIS — M79605 Pain in left leg: Secondary | ICD-10-CM | POA: Diagnosis not present

## 2024-08-13 ENCOUNTER — Other Ambulatory Visit: Payer: Self-pay | Admitting: Neurology

## 2024-08-13 ENCOUNTER — Ambulatory Visit: Payer: Self-pay | Admitting: Hematology & Oncology

## 2024-08-13 DIAGNOSIS — G20B2 Parkinson's disease with dyskinesia, with fluctuations: Secondary | ICD-10-CM

## 2024-08-14 ENCOUNTER — Other Ambulatory Visit: Payer: Self-pay | Admitting: Hematology & Oncology

## 2024-08-14 ENCOUNTER — Other Ambulatory Visit: Payer: Self-pay | Admitting: Neurology

## 2024-08-14 DIAGNOSIS — G20B2 Parkinson's disease with dyskinesia, with fluctuations: Secondary | ICD-10-CM

## 2024-08-14 DIAGNOSIS — G20A1 Parkinson's disease without dyskinesia, without mention of fluctuations: Secondary | ICD-10-CM

## 2024-08-14 DIAGNOSIS — C8195 Hodgkin lymphoma, unspecified, lymph nodes of inguinal region and lower limb: Secondary | ICD-10-CM

## 2024-08-18 ENCOUNTER — Telehealth: Payer: Self-pay

## 2024-08-18 NOTE — Telephone Encounter (Signed)
 Copied from CRM #[id number]. Topic: Clinical - Medication Question >> Aug 18, 2024 12:19 PM Tysheama G wrote: Reason for CRM: Patient wanted to know the history of his medication dexamethasone  (DECADRON ) injection 20 mg  tadalafil  (CIALIS ) 5 MG tablet  And how long has he been on it? Callback number [phone]

## 2024-08-19 ENCOUNTER — Other Ambulatory Visit: Payer: Self-pay | Admitting: Neurology

## 2024-08-19 DIAGNOSIS — G20A1 Parkinson's disease without dyskinesia, without mention of fluctuations: Secondary | ICD-10-CM

## 2024-08-19 DIAGNOSIS — G20B2 Parkinson's disease with dyskinesia, with fluctuations: Secondary | ICD-10-CM

## 2024-08-22 ENCOUNTER — Other Ambulatory Visit: Payer: Self-pay | Admitting: Neurology

## 2024-08-22 DIAGNOSIS — G20B2 Parkinson's disease with dyskinesia, with fluctuations: Secondary | ICD-10-CM

## 2024-08-26 DIAGNOSIS — H524 Presbyopia: Secondary | ICD-10-CM | POA: Diagnosis not present

## 2024-08-26 DIAGNOSIS — H25043 Posterior subcapsular polar age-related cataract, bilateral: Secondary | ICD-10-CM | POA: Diagnosis not present

## 2024-08-26 DIAGNOSIS — H5213 Myopia, bilateral: Secondary | ICD-10-CM | POA: Diagnosis not present

## 2024-08-26 DIAGNOSIS — H2513 Age-related nuclear cataract, bilateral: Secondary | ICD-10-CM | POA: Diagnosis not present

## 2024-08-26 DIAGNOSIS — H43813 Vitreous degeneration, bilateral: Secondary | ICD-10-CM | POA: Diagnosis not present

## 2024-08-26 DIAGNOSIS — H25013 Cortical age-related cataract, bilateral: Secondary | ICD-10-CM | POA: Diagnosis not present

## 2024-09-09 ENCOUNTER — Telehealth: Payer: Self-pay

## 2024-09-09 NOTE — Telephone Encounter (Signed)
 Spoke with pt, pt is aware and expressed understanding.

## 2024-09-09 NOTE — Telephone Encounter (Signed)
 Copied from CRM #[id number]. Topic: Clinical - Medication Question >> Sep 09, 2024 10:59 AM Suzen RAMAN wrote: Reason for CRM: patient would like to know why he was prescribed tadalafil  (CIALIS ) 5 MG tablet. Patient states he is aware that its usually for ED but he doesn't have that issue.     CB# [phone]

## 2024-09-14 DIAGNOSIS — H25012 Cortical age-related cataract, left eye: Secondary | ICD-10-CM | POA: Diagnosis not present

## 2024-09-14 DIAGNOSIS — H25042 Posterior subcapsular polar age-related cataract, left eye: Secondary | ICD-10-CM | POA: Diagnosis not present

## 2024-09-14 DIAGNOSIS — H2512 Age-related nuclear cataract, left eye: Secondary | ICD-10-CM | POA: Diagnosis not present

## 2024-09-14 DIAGNOSIS — H25812 Combined forms of age-related cataract, left eye: Secondary | ICD-10-CM | POA: Diagnosis not present

## 2024-09-14 DIAGNOSIS — H25811 Combined forms of age-related cataract, right eye: Secondary | ICD-10-CM | POA: Diagnosis not present

## 2024-09-30 ENCOUNTER — Other Ambulatory Visit: Payer: Self-pay

## 2024-09-30 DIAGNOSIS — G20B2 Parkinson's disease with dyskinesia, with fluctuations: Secondary | ICD-10-CM

## 2024-09-30 MED ORDER — ENTACAPONE 200 MG PO TABS: 200.0000 mg | ORAL_TABLET | Freq: Three times a day (TID) | ORAL | 0 refills | Status: DC

## 2024-10-09 ENCOUNTER — Other Ambulatory Visit: Payer: Self-pay

## 2024-10-11 ENCOUNTER — Other Ambulatory Visit: Payer: Self-pay | Admitting: Neurology

## 2024-10-11 DIAGNOSIS — G20B2 Parkinson's disease with dyskinesia, with fluctuations: Secondary | ICD-10-CM

## 2024-10-13 ENCOUNTER — Other Ambulatory Visit: Payer: Self-pay | Admitting: Neurology

## 2024-10-13 DIAGNOSIS — G20B2 Parkinson's disease with dyskinesia, with fluctuations: Secondary | ICD-10-CM

## 2024-10-16 ENCOUNTER — Telehealth: Payer: Self-pay

## 2024-10-16 ENCOUNTER — Telehealth: Payer: Self-pay | Admitting: Neurology

## 2024-10-16 ENCOUNTER — Other Ambulatory Visit (HOSPITAL_COMMUNITY): Payer: Self-pay

## 2024-10-16 ENCOUNTER — Other Ambulatory Visit: Payer: Self-pay

## 2024-10-16 DIAGNOSIS — G20B2 Parkinson's disease with dyskinesia, with fluctuations: Secondary | ICD-10-CM

## 2024-10-16 MED ORDER — ENTACAPONE 200 MG PO TABS: 200.0000 mg | ORAL_TABLET | Freq: Three times a day (TID) | ORAL | 0 refills | Status: DC

## 2024-10-16 NOTE — Telephone Encounter (Signed)
 Patient called office today he let me know that he received a letter from [facility] and it stated he would not be able to have his meds covered. Carbidopa -Levodopa  25/100 and 50/200 are not formulary and would not be covered. I did nt know if you had heard about this? I called them and they really didn't have an answer on my next steps to get a formulary exception or a Teir exemption?

## 2024-10-16 NOTE — Telephone Encounter (Signed)
 Not really much we can do about future billing because currently it's still covered. We want be able to do anything until we get a rejection to see what we will need to do.

## 2024-10-16 NOTE — Telephone Encounter (Signed)
 Reached out to PA team about the medication Not really much we can do about future billing because currently it's still covered. We want be able to do anything until we get a rejection to see what we will need to do.

## 2024-10-16 NOTE — Telephone Encounter (Signed)
 Brian [name] called in stating that he has some questions regarding medication with the insurance. He was informed that medication would not be covered under insurance. Requested a call back. Would like to know what's going on.   PH: [phone]

## 2024-10-22 ENCOUNTER — Other Ambulatory Visit: Payer: Self-pay

## 2024-10-27 ENCOUNTER — Encounter: Payer: Self-pay | Admitting: *Deleted

## 2024-10-30 ENCOUNTER — Other Ambulatory Visit: Payer: Self-pay | Admitting: Neurology

## 2024-10-30 DIAGNOSIS — G20B2 Parkinson's disease with dyskinesia, with fluctuations: Secondary | ICD-10-CM

## 2024-11-02 ENCOUNTER — Ambulatory Visit: Admitting: Neurology

## 2024-11-02 DIAGNOSIS — H25011 Cortical age-related cataract, right eye: Secondary | ICD-10-CM | POA: Diagnosis not present

## 2024-11-02 DIAGNOSIS — H25041 Posterior subcapsular polar age-related cataract, right eye: Secondary | ICD-10-CM | POA: Diagnosis not present

## 2024-11-02 DIAGNOSIS — H2511 Age-related nuclear cataract, right eye: Secondary | ICD-10-CM | POA: Diagnosis not present

## 2024-11-02 DIAGNOSIS — H25811 Combined forms of age-related cataract, right eye: Secondary | ICD-10-CM | POA: Diagnosis not present

## 2024-11-04 ENCOUNTER — Telehealth: Payer: Self-pay | Admitting: Neurology

## 2024-11-04 ENCOUNTER — Other Ambulatory Visit: Payer: Self-pay | Admitting: Neurology

## 2024-11-04 DIAGNOSIS — G20B2 Parkinson's disease with dyskinesia, with fluctuations: Secondary | ICD-10-CM

## 2024-11-04 NOTE — Telephone Encounter (Signed)
 Called Brian [name] back talked with her about patient she said he lately has been saying mean things and hurtful comments to his best friend and his brother he is still having hallucinations he has no filter patient had hallucinations a lot of dyskinesia

## 2024-11-04 NOTE — Telephone Encounter (Signed)
 Pt.'s wife calling to discuss his next appt from a voicemail left last week, would like a call bk

## 2024-11-05 ENCOUNTER — Encounter (HOSPITAL_COMMUNITY)
Admission: RE | Admit: 2024-11-05 | Discharge: 2024-11-05 | Attending: Hematology & Oncology | Admitting: Hematology & Oncology

## 2024-11-05 ENCOUNTER — Encounter: Payer: Self-pay | Admitting: Hematology & Oncology

## 2024-11-05 DIAGNOSIS — C8195 Hodgkin lymphoma, unspecified, lymph nodes of inguinal region and lower limb: Secondary | ICD-10-CM

## 2024-11-05 LAB — GLUCOSE, CAPILLARY: Glucose-Capillary: 146 mg/dL — ABNORMAL HIGH (ref 70–99)

## 2024-11-05 MED ORDER — FLUDEOXYGLUCOSE F - 18 (FDG) INJECTION: 7.8000 | Freq: Once | INTRAVENOUS | Status: DC

## 2024-11-06 ENCOUNTER — Encounter: Payer: Self-pay | Admitting: Hematology & Oncology

## 2024-11-06 NOTE — Progress Notes (Signed)
 Assessment/Plan:   1.  Parkinsons Disease  -Continue carbidopa /levodopa  25/100 and take 2 tablets at 7 AM, 2 tablets at 11 AM, 2 tablet at 3 PM, 2 tablet at 7 PM.  He's taking an extra pill 3-4 days/week             -Continue carbidopa /levodopa  50/200 at bedtime.  -started inbrija .  Shown how to use.  Samples given.  Call me and let me know how he is doing.    -discussed rytary /crexont  but worry he won't be happy with it.  It would offer potentially more on time with less dyskinesia but I find that pts who are used to IR only don't feel that they move as well  -Discussed stopping entacapone  due to hallucinations.  Will wean by decreasing a pill per week and then e/c.   If that does not help, we will likely have to wean off of pramipexole  will             -Continue pramipexole  0.5 mg 3 times per day.  Higher dosages caused hallucinations.  -will consider inbrija  in the future  - Discussed several times this visit but I thought he needed to use his walker at all times.  -Patient sought second opinion at [facility] with Dr. Hans in May, 2023.  There was no significant changes in medication.  -he asks several times today about going to see Dr. Pablo at [facility].  Discussed that I don't have objection but think that the suggestions are going to be some of the same ones that we have given but he has opted not to take at this point.  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  Patient has synkinetic reinnervation of the right face.   3.  Hodgkin's lymphoma             -PET scan in November, 2023 with evidence of recurrence and had chemo again but back in remission and port removed again.  Repeat PET scan results pending   4.  Renal insufficiency             -monitoring.  Mild.     5.  Lumbar radiculopathy             -Patient had microdiscectomy Mar 29, 2021  -Microdiscectomy complicated by subdural CSF fluid collection at L1  vertebral body and L5-S1 level.  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  -Patient seen by [facility] neurology and [facility]surgery.    -ambulation is affected by continued pain.  He just had rhizotomy with Dr. At [facility] ortho yesterday  6.  DVT  -Developed while on chemo and was weak and now on Xarelto .  -Patient is obviously a fall risk and we will need to monitor.  7.    Low blood pressure (not today but have been at most of his visits) -Discussed addition of midodrine but not a lot of symptoms so increase water and monitor symptoms  8.  Mood change -encouraged counseling as part of multidisciplinary program.  He has access to counselors via [facility] but we are happy to refer as well.  He is also seeing a different counselor in the past that he liked. Subjective:  Discussed  the use of AI scribe software for clinical note transcription with the patient, who gave verbal consent to proceed.  History of Present Illness  [name] was seen today in follow up for Parkinsons disease.  My previous records were reviewed prior to todays visit as well as outside records available to me.  Pt with Nathanel who supplements the hx. Nathanel actually called us  last week and stated that she wanted to make sure that we discussed hallucinations at this visit.  She also noted mood changes, and we encouraged a counseling program when she called.  At his last oncology visit, his blood pressures were running fairly low and looking at his review flowsheets, they have been running low for at least 6 months.  Over the past six months, he has experienced low blood pressure readings, though not consistently. Home blood pressure measurements often result in errors, possibly due to movement. He experiences rare episodes of lightheadedness and dizziness, particularly after his recent cataract  surgery. He reports frequent nighttime urination.  He describes frequent falls, occurring five to six times a day, often without warning, likening it to 'being shot by a sniper.' Some falls are associated with anxiety and multitasking, such as walking while performing other tasks. He uses a walker occasionally but is resistant to regular use. He has a history of leg issues and back problems. He has previously sought treatment at [facility] and underwent an ablation procedure without relief.  He experiences daily hallucinations, including seeing people and animals, such as a 'lady looking out her window' or a 'fire hydrant turning into a goat.' These hallucinations are not frightening but are frequent. He is aware that his medications, entacapone  and pramipexole , may contribute to these hallucinations. He has been on higher dosages of pramipexole  in the past.  He takes carbidopa /levodopa  at a dose of two tablets at 7 AM, 11 AM, 3 PM, and 7 PM, with a long-acting dose at bedtime. He also takes pramipexole  three times a day. He occasionally takes extra doses of carbidopa /levodopa , particularly when attending events or when he feels it is necessary.  He has a history of chemotherapy, which has left him with persistent numbness. He also mentions difficulty with REM sleep and frequent nighttime awakenings.    Current prescribed movement disorder medications: carbidopa /levodopa  25/100, 2 tablets at 7 AM, 2 tablets at 11 AM, 2 tablet at 3 PM, 2 tablet at 7 PM  carbidopa /levodopa  50/200 CR q hs  Pramipexole  0.5 mg 3 times per day  Entacapone , 200 mg 3 times per day  PREVIOUS MEDICATIONS: carbidopa /levodopa  50/200 CR (d/c just because tried it for pain and still waking up with pain in back so d/c it); amantadine  (he didn't want to take it and he stopped it - didn't think that dyskinesia was an issue); gabapentin  without help for PN in past  ALLERGIES:  No Known Allergies  CURRENT MEDICATIONS:  Outpatient  Encounter Medications as of 11/10/2024  Medication Sig   ascorbic acid (VITAMIN C) 500 MG tablet Take 500 mg by mouth daily.   carbidopa -levodopa  (SINEMET  CR) 50-200 MG tablet TAKE 1 TABLET BY MOUTH AT BEDTIME   carbidopa -levodopa  (SINEMET  IR) 25-100 MG tablet TAKE 2 TABLETS BY MOUTH AT 7AM, 2 TABLETS AT 11AM, 2 TABLETS AT 3PM AND 2 TABLETS AT 7PM   cholecalciferol  (VITAMIN D3) 25 MCG (1000 UNIT) tablet Take 1,000 Units by mouth daily.   diphenhydramine -acetaminophen  (TYLENOL  PM) 25-500 MG TABS tablet Take 1 tablet by mouth at bedtime as needed.  LORazepam  (ATIVAN ) 0.5 MG tablet TAKE 1 TABLET(0.5 MG) BY MOUTH EVERY 6 HOURS AS NEEDED FOR ANXIETY   pramipexole  (MIRAPEX ) 0.5 MG tablet TAKE 1 TABLET(0.5 MG) BY MOUTH THREE TIMES DAILY   tadalafil  (CIALIS ) 5 MG tablet Take 1 tablet (5 mg total) by mouth daily.   [DISCONTINUED] entacapone  (COMTAN ) 200 MG tablet Take 1 tablet (200 mg total) by mouth 3 (three) times daily.   Facility-Administered Encounter Medications as of 11/10/2024  Medication   sodium chloride  flush (NS) 0.9 % injection 10 mL   [DISCONTINUED] fludeoxyglucose  F - 18 (FDG) injection 7.8 millicurie    Objective:   PHYSICAL EXAMINATION:    VITALS:   Vitals:   11/10/24 1132  BP: (!) 140/88  Pulse: 73  SpO2: 98%  Weight: 167 lb 12.8 oz (76.1 kg)    Wt Readings from Last 3 Encounters:  11/10/24 167 lb 12.8 oz (76.1 kg)  08/07/24 166 lb 1.9 oz (75.4 kg)  05/22/24 165 lb 6.4 oz (75 kg)     GEN:  The patient appears stated age and is in NAD. HEENT:  Normocephalic, atraumatic.  The mucous membranes are moist. The superficial temporal arteries are without ropiness or tenderness. CV:  RRR Lungs:  CTAB Neck/HEME:  There are no carotid bruits bilaterally.  Neurological examination:  Orientation: The patient is alert and oriented x3. Cranial nerves: There is good facial symmetry (slight dec NL fold on R) with facial hypomimia. The speech is fluent and clear. Soft palate  rises symmetrically and there is no tongue deviation. Hearing is intact to conversational tone. Sensation: Sensation is intact to light touch throughout Motor: Strength is at least antigravity x4.  Movement examination: Tone: There is normal tone in the normal Abnormal movements: Patient has mod dyskinesia in the trunk and L>R leg Coordination:  There is mild decremation only with foot taps on the L Gait and Station: The patient ambulated well today.  No significant freezing today.  Carries his cane in the hall.  I have reviewed and interpreted the following labs independently    Chemistry      Component Value Date/Time   NA 142 08/07/2024 1203   NA 142 04/02/2017 0000   NA 140 07/19/2016 1305   K 5.0 08/07/2024 1203   K 4.4 07/19/2016 1305   CL 107 08/07/2024 1203   CL 105 02/08/2016 1117   CL 107 03/25/2013 0828   CO2 26 08/07/2024 1203   CO2 26 07/19/2016 1305   BUN 35 (H) 08/07/2024 1203   BUN 21 04/02/2017 0000   BUN 22.8 07/19/2016 1305   CREATININE 1.29 (H) 08/07/2024 1203   CREATININE 1.1 07/19/2016 1305   GLU 105 04/02/2017 0000      Component Value Date/Time   CALCIUM  9.4 08/07/2024 1203   CALCIUM  9.8 07/19/2016 1305   ALKPHOS 98 08/07/2024 1203   ALKPHOS 76 07/19/2016 1305   AST 22 08/07/2024 1203   AST 18 07/19/2016 1305   ALT 7 08/07/2024 1203   ALT 14 07/19/2016 1305   BILITOT 0.5 08/07/2024 1203   BILITOT 0.70 07/19/2016 1305       Lab Results  Component Value Date   WBC 5.7 08/07/2024   HGB 12.3 (L) 08/07/2024   HCT 37.2 (L) 08/07/2024   MCV 99.5 08/07/2024   PLT 225 08/07/2024    Lab Results  Component Value Date   TSH 5.99 (H) 04/29/2024     Total time spent on today's visit was 60 minutes, including both face-to-face  time and nonface-to-face time.  Time included that spent on review of records (prior notes available to me/labs/imaging if pertinent), discussing treatment and goals, answering patient's questions and coordinating  care.  Cc:  Copland, Harlene BROCKS, MD

## 2024-11-09 ENCOUNTER — Encounter (HOSPITAL_COMMUNITY)
Admission: RE | Admit: 2024-11-09 | Discharge: 2024-11-09 | Disposition: A | Discharge status: Home / Self Care | Source: Ambulatory Visit | Attending: Hematology & Oncology | Admitting: Hematology & Oncology

## 2024-11-09 ENCOUNTER — Encounter: Payer: Self-pay | Admitting: Hematology & Oncology

## 2024-11-09 DIAGNOSIS — C8195 Hodgkin lymphoma, unspecified, lymph nodes of inguinal region and lower limb: Secondary | ICD-10-CM | POA: Diagnosis not present

## 2024-11-09 LAB — GLUCOSE, CAPILLARY: Glucose-Capillary: 111 mg/dL — ABNORMAL HIGH (ref 70–99)

## 2024-11-09 MED ORDER — FLUDEOXYGLUCOSE F - 18 (FDG) INJECTION
8.3000 | Freq: Once | INTRAVENOUS | Status: AC
  Administered 2024-11-09: 07:00:00 8.27 via INTRAVENOUS

## 2024-11-09 NOTE — Telephone Encounter (Signed)
 Called and discussed with Nathanel getting a counselor and that Oncology would be a great resource to help get this done

## 2024-11-10 ENCOUNTER — Other Ambulatory Visit: Payer: Self-pay

## 2024-11-10 ENCOUNTER — Ambulatory Visit: Admitting: Neurology

## 2024-11-10 VITALS — BP 140/88 | HR 73 | Wt 167.8 lb

## 2024-11-10 DIAGNOSIS — R441 Visual hallucinations: Secondary | ICD-10-CM | POA: Diagnosis not present

## 2024-11-10 DIAGNOSIS — G20B2 Parkinson's disease with dyskinesia, with fluctuations: Secondary | ICD-10-CM | POA: Diagnosis not present

## 2024-11-10 DIAGNOSIS — M5416 Radiculopathy, lumbar region: Secondary | ICD-10-CM

## 2024-11-10 NOTE — Progress Notes (Signed)
 Medication Samples have been provided to the patient.  Drug name: inbroja       Strength: 42mg         Qty: 1  LOT: [id number]  Exp.Date: 11/2025  Dosing instructions: as directed   The patient has been instructed regarding the correct time, dose, and frequency of taking this medication, including desired effects and most common side effects.

## 2024-11-10 NOTE — Patient Instructions (Signed)
°  VISIT SUMMARY: During your visit, we discussed your concerns about low blood pressure, frequent falls, and hallucinations. We reviewed your current medications and their potential side effects, and we made some adjustments to help manage your symptoms better.  YOUR PLAN: -PARKINSON'S DISEASE: Parkinson's disease is a chronic condition that affects movement and can cause symptoms like tremors, stiffness, and difficulty with balance. To help manage your symptoms, we will gradually discontinue entacapone  over the next three weeks. You will take it three times a day for the first week, then twice a day for the second week, and once a day for the third week before stopping it completely. We have also prescribed Inbrija , a levodopa  inhalation powder, for use as needed. Please follow the instructions on how to use it properly. Additionally, we encourage you to use a walker to reduce your risk of falls and consider counseling for emotional support.  -ORTHOSTATIC HYPOTENSION: Orthostatic hypotension is a condition where your blood pressure drops when you stand up, causing dizziness or lightheadedness. To help manage this, increase your water intake, especially in the morning, to prevent dehydration. Please monitor your blood pressure and any symptoms of lightheadedness.  INSTRUCTIONS: Please follow the tapering schedule for discontinuing entacapone  and use Inbrija  as needed. Increase your water intake and monitor your blood pressure regularly. Consider using a walker to prevent falls and think about counseling for emotional support. Follow up with your healthcare provider as needed.  We are going to start inbrija .  Remember that TWO capsules is ONE dosage (never inhale just one capsule).  You can inhale the capsules as needed up to 5 times per day, separated by 2 hour intervals.  Many patients use this right when the wake up to help with first morning on and then as needed during the day.  You should take a  sip of water prior to using the inhaler to avoid side effects.  It may generate some cough right when you use it and that is normal.  There are nurse educators available to help you with this device.  You can call [phone] and they will set you up with a nurse educator to assist you for free of charge.  They are available 8am-8pm Monday-Friday.                       Contains text generated by Abridge.                                 Contains text generated by Abridge.

## 2024-11-11 ENCOUNTER — Other Ambulatory Visit: Payer: Self-pay

## 2024-11-12 ENCOUNTER — Other Ambulatory Visit: Payer: Self-pay | Admitting: Neurology

## 2024-11-12 DIAGNOSIS — G20B2 Parkinson's disease with dyskinesia, with fluctuations: Secondary | ICD-10-CM

## 2024-11-16 ENCOUNTER — Telehealth: Payer: Self-pay | Admitting: Emergency Medicine

## 2024-11-16 NOTE — Telephone Encounter (Signed)
 ATC x1. LMTCB.  Dr. Zaida has opening 12/31 at 10am

## 2024-11-16 NOTE — Telephone Encounter (Signed)
 Need to get this pt seen ASAP by any provider as a new consult for mediastinal adenopathy and to plan for EBUS. Thank you

## 2024-11-17 NOTE — Telephone Encounter (Signed)
 Pt has been scheduled for appt on 11/25/24 at 10:00

## 2024-11-18 ENCOUNTER — Other Ambulatory Visit: Payer: Self-pay | Admitting: Neurology

## 2024-11-18 DIAGNOSIS — G20A1 Parkinson's disease without dyskinesia, without mention of fluctuations: Secondary | ICD-10-CM

## 2024-11-25 ENCOUNTER — Inpatient Hospital Stay: Attending: Hematology & Oncology

## 2024-11-25 ENCOUNTER — Inpatient Hospital Stay: Admitting: Hematology & Oncology

## 2024-11-25 ENCOUNTER — Telehealth: Payer: Self-pay

## 2024-11-25 ENCOUNTER — Encounter: Payer: Self-pay | Admitting: Hematology & Oncology

## 2024-11-25 ENCOUNTER — Ambulatory Visit

## 2024-11-25 ENCOUNTER — Ambulatory Visit: Payer: Self-pay | Admitting: Hematology & Oncology

## 2024-11-25 VITALS — BP 99/73 | HR 89 | Temp 98.5°F | Resp 20 | Ht 70.0 in | Wt 162.1 lb

## 2024-11-25 VITALS — BP 143/89 | HR 87 | Ht 70.0 in | Wt 162.4 lb

## 2024-11-25 DIAGNOSIS — C8195 Hodgkin lymphoma, unspecified, lymph nodes of inguinal region and lower limb: Secondary | ICD-10-CM | POA: Diagnosis not present

## 2024-11-25 DIAGNOSIS — Z9484 Stem cells transplant status: Secondary | ICD-10-CM | POA: Diagnosis not present

## 2024-11-25 DIAGNOSIS — R590 Localized enlarged lymph nodes: Secondary | ICD-10-CM | POA: Diagnosis not present

## 2024-11-25 DIAGNOSIS — R918 Other nonspecific abnormal finding of lung field: Secondary | ICD-10-CM | POA: Insufficient documentation

## 2024-11-25 DIAGNOSIS — Z923 Personal history of irradiation: Secondary | ICD-10-CM | POA: Insufficient documentation

## 2024-11-25 DIAGNOSIS — Z9221 Personal history of antineoplastic chemotherapy: Secondary | ICD-10-CM | POA: Diagnosis not present

## 2024-11-25 DIAGNOSIS — G20A1 Parkinson's disease without dyskinesia, without mention of fluctuations: Secondary | ICD-10-CM | POA: Insufficient documentation

## 2024-11-25 LAB — CBC WITH DIFFERENTIAL (CANCER CENTER ONLY)
Abs Immature Granulocytes: 0.03 K/uL (ref 0.00–0.07)
Basophils Absolute: 0 K/uL (ref 0.0–0.1)
Basophils Relative: 1 %
Eosinophils Absolute: 0 K/uL (ref 0.0–0.5)
Eosinophils Relative: 1 %
HCT: 40.2 % (ref 39.0–52.0)
Hemoglobin: 13.2 g/dL (ref 13.0–17.0)
Immature Granulocytes: 1 %
Lymphocytes Relative: 13 %
Lymphs Abs: 0.6 K/uL — ABNORMAL LOW (ref 0.7–4.0)
MCH: 31.7 pg (ref 26.0–34.0)
MCHC: 32.8 g/dL (ref 30.0–36.0)
MCV: 96.6 fL (ref 80.0–100.0)
Monocytes Absolute: 0.5 K/uL (ref 0.1–1.0)
Monocytes Relative: 12 %
Neutro Abs: 3 K/uL (ref 1.7–7.7)
Neutrophils Relative %: 72 %
Platelet Count: 194 K/uL (ref 150–400)
RBC: 4.16 MIL/uL — ABNORMAL LOW (ref 4.22–5.81)
RDW: 13.2 % (ref 11.5–15.5)
WBC Count: 4.1 K/uL (ref 4.0–10.5)
nRBC: 0 % (ref 0.0–0.2)

## 2024-11-25 LAB — FERRITIN: Ferritin: 331 ng/mL (ref 24–336)

## 2024-11-25 LAB — CMP (CANCER CENTER ONLY)
ALT: 6 U/L (ref 0–44)
AST: 26 U/L (ref 15–41)
Albumin: 4.4 g/dL (ref 3.5–5.0)
Alkaline Phosphatase: 79 U/L (ref 38–126)
Anion gap: 11 (ref 5–15)
BUN: 36 mg/dL — ABNORMAL HIGH (ref 8–23)
CO2: 24 mmol/L (ref 22–32)
Calcium: 9.1 mg/dL (ref 8.9–10.3)
Chloride: 105 mmol/L (ref 98–111)
Creatinine: 1.15 mg/dL (ref 0.61–1.24)
GFR, Estimated: >60 mL/min
Glucose, Bld: 107 mg/dL — ABNORMAL HIGH (ref 70–99)
Potassium: 4.7 mmol/L (ref 3.5–5.1)
Sodium: 140 mmol/L (ref 135–145)
Total Bilirubin: 0.4 mg/dL (ref 0.0–1.2)
Total Protein: 6.9 g/dL (ref 6.5–8.1)

## 2024-11-25 LAB — RETICULOCYTES
Immature Retic Fract: 4.1 % (ref 2.3–15.9)
RBC.: 4.2 MIL/uL — ABNORMAL LOW (ref 4.22–5.81)
Retic Count, Absolute: 20.6 K/uL (ref 19.0–186.0)
Retic Ct Pct: 0.5 % (ref 0.4–3.1)

## 2024-11-25 LAB — LACTATE DEHYDROGENASE: LDH: 238 U/L — ABNORMAL HIGH (ref 105–235)

## 2024-11-25 LAB — IRON AND IRON BINDING CAPACITY (CC-WL,HP ONLY)
Iron: 55 ug/dL (ref 45–182)
Saturation Ratios: 17 % — ABNORMAL LOW (ref 17.9–39.5)
TIBC: 328 ug/dL (ref 250–450)
UIBC: 272 ug/dL

## 2024-11-25 NOTE — Patient Instructions (Addendum)
" °  VISIT SUMMARY: You came in today to discuss the hypermetabolic mediastinal lymph nodes found on your recent PET scan. Given your history of recurrent Hodgkin's lymphoma, we need to investigate further to rule out a recurrence. We also reviewed your current symptoms and medications.  YOUR PLAN: -MEDIASTINAL LYMPHADENOPATHY: Mediastinal lymphadenopathy means that the lymph nodes in the area between your lungs are enlarged. This can be due to various reasons, including a possible recurrence of Hodgkin's lymphoma. We have scheduled a bronchoscopy with EBUS (a procedure to look inside your lungs and take a biopsy), to get a clearer diagnosis. Please avoid taking aspirin  and ibuprofen  two days before the procedure. Make sure to have someone stay with you the night after the procedure. We have provided you with the necessary paperwork for the procedure.  INSTRUCTIONS: Please follow the instructions to avoid aspirin  and ibuprofen  two days before your bronchoscopy with EBUS. Ensure you have someone to stay with you the night after the procedure. Bring the provided paperwork with you to the appointment.                                 Contains text generated by Abridge.   "

## 2024-11-25 NOTE — Progress Notes (Signed)
 "   Subjective:   PATIENT ID: Brian [name] GENDER: male DOB: [birth year], MRN: [id number]   HPI Discussed the use of AI scribe software for clinical note transcription with the patient, who gave verbal consent to proceed.  History of Present Illness Brian [name] is a 63 year old male with recurrent Hodgkin's lymphoma who presents with hypermetabolic mediastinal lymph nodes on PET scan. He was referred by his oncologist, Dr. Corrin, for evaluation of hypermetabolic mediastinal lymph nodes.  He has a history of recurrent Hodgkin's lymphoma, having undergone stem cell transplant, CAR T therapy and subsequently relapsed. He has been on nivolumab  every three months, completing eighteen cycles, but this is currently on hold. A PET scan in December 2025 showed hypermetabolic mediastinal lymph nodes.  He has Parkinson's disease, which he describes as 'awful,' with symptoms impacting his daily life, including lightheadedness and difficulty driving, leading to early retirement due to disability. He occasionally takes aspirin  for pain.  He was previously on Xarelto  for blood clots in his right leg, but this was discontinued in June 2025.  He lives with his son, who also had a cold, but neither has been tested for any infections. He reports having a cold since Christmas Day, with symptoms of tiredness, nasal congestion, and a mild cough. No sleep apnea, but occasional snoring is reported.     Past Medical History:  Diagnosis Date   Anxiety    Dysrhythmia    past hx pvc   Goals of care, counseling/discussion 09/25/2018   H/O autologous stem cell transplant (HCC)    may 2014  at [facility]   History of Bell's palsy    2009  RIGHT SIDE--  HAS 80% FUNCTION / PT STATES A LITTLE ASYMETRICAL AND EFFECTS MOUTH   History of radiation therapy 10/18/17-11/05/17   sprine T1 26 Gy in 13 fractions, spine boost 10 Gy in 5 fractions   History of radiation therapy    Right hip, Rt Sclav- 07/23/22-08/07/11- Dr. 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   Mass of right chest wall 09/18/2018   Mass of right inguinal region    Neuromuscular disorder (HCC)    bilateral feet neuropathy   PVC (premature ventricular contraction)    benign   TIA (transient ischemic attack) 02/2015   probable TIA   Wears contact lenses      Family History  Problem Relation Age of Onset   Cancer Mother 10       ovarian   Ovarian cancer Mother 61   Heart disease Father    Stroke Father    Cancer Brother        testicular   Hypertension Brother    Healthy Son    Healthy Son    Hypertension Brother    Other Brother        CMT   Hypertension Brother      Social History   Socioeconomic History   Marital status: Divorced    Spouse name: Not on file   Number of children: 2   Years of education: Not on file   Highest education level: Bachelor's degree (e.g., BA, AB, BS)  Occupational History   Occupation: Best Boy: [employer]  Tobacco Use   Smoking status: Never  Smokeless tobacco: Never  Vaping Use   Vaping status: Never Used  Substance and Sexual Activity   Alcohol use: Yes    Alcohol/week: 14.0 standard drinks of alcohol    Types: 7 Glasses of wine, 7 Cans of beer per week    Comment: one drink per night   Drug use: No   Sexual activity: Not Currently  Other Topics Concern   Not on file  Social History Narrative   DIVORCE. Education: [employer]. Exercise: jog/walk/ lift weights 7 days a week, 1-2 miles.   Right handed   2 children   Social Drivers of Health   Tobacco Use: Low Risk (11/25/2024)   Patient History    Smoking Tobacco Use: Never    Smokeless Tobacco Use: Never    Passive Exposure: Not on file  Financial Resource Strain: Low Risk (04/26/2024)   Overall  Financial Resource Strain (CARDIA)    Difficulty of Paying Living Expenses: Not very hard  Food Insecurity: No Food Insecurity (04/26/2024)   Hunger Vital Sign    Worried About Running Out of Food in the Last Year: Never true    Ran Out of Food in the Last Year: Never true  Transportation Needs: No Transportation Needs (04/26/2024)   PRAPARE - Administrator, Civil Service (Medical): No    Lack of Transportation (Non-Medical): No  Physical Activity: Sufficiently Active (04/26/2024)   Exercise Vital Sign    Days of Exercise per Week: 4 days    Minutes of Exercise per Session: 40 min  Stress: Stress Concern Present (04/26/2024)   [employer] of Occupational Health - Occupational Stress Questionnaire    Feeling of Stress : To some extent  Social Connections: Moderately Integrated (04/26/2024)   Social Connection and Isolation Panel    Frequency of Communication with Friends and Family: More than three times a week    Frequency of Social Gatherings with Friends and Family: Once a week    Attends Religious Services: More than 4 times per year    Active Member of [employer] or Organizations: Yes    Attends Banker Meetings: More than 4 times per year    Marital Status: Divorced  Intimate Partner Violence: Not At Risk (01/25/2023)   Humiliation, Afraid, Rape, and Kick questionnaire    Fear of Current or Ex-Partner: No    Emotionally Abused: No    Physically Abused: No    Sexually Abused: No  Depression (PHQ2-9): Low Risk (08/07/2024)   Depression (PHQ2-9)    PHQ-2 Score: 0  Alcohol Screen: Low Risk (04/26/2024)   Alcohol Screen    Last Alcohol Screening Score (AUDIT): 3  Housing: Unknown (04/26/2024)   Housing Stability Vital Sign    Unable to Pay for Housing in the Last Year: No    Number of Times Moved in the Last Year: Not on file    Homeless in the Last Year: No  Utilities: Not At Risk (01/25/2023)   AHC Utilities    Threatened with loss of utilities: No  Health  Literacy: Not on file     Allergies[1]   Outpatient Medications Prior to Visit  Medication Sig Dispense Refill   ascorbic acid (VITAMIN C) 500 MG tablet Take 500 mg by mouth daily.     carbidopa -levodopa  (SINEMET  CR) 50-200 MG tablet TAKE 1 TABLET BY MOUTH AT BEDTIME 90 tablet 0   carbidopa -levodopa  (SINEMET  IR) 25-100 MG tablet TAKE 2 TABLETS BY MOUTH AT 7AM, 2 TABLETS AT 11AM, 2 TABLETS  AT 3PM AND 2 TABLETS AT 7PM 720 tablet 0   cholecalciferol  (VITAMIN D3) 25 MCG (1000 UNIT) tablet Take 1,000 Units by mouth daily.     diphenhydramine -acetaminophen  (TYLENOL  PM) 25-500 MG TABS tablet Take 1 tablet by mouth at bedtime as needed.     LORazepam  (ATIVAN ) 0.5 MG tablet TAKE 1 TABLET(0.5 MG) BY MOUTH EVERY 6 HOURS AS NEEDED FOR ANXIETY 20 tablet 1   pramipexole  (MIRAPEX ) 0.5 MG tablet TAKE 1 TABLET(0.5 MG) BY MOUTH THREE TIMES DAILY 270 tablet 0   tadalafil  (CIALIS ) 5 MG tablet Take 1 tablet (5 mg total) by mouth daily. 30 tablet 11   Facility-Administered Medications Prior to Visit  Medication Dose Route Frequency Provider Last Rate Last Admin   sodium chloride  flush (NS) 0.9 % injection 10 mL  10 mL Intracatheter PRN Timmy Maude SAUNDERS, MD   10 mL at 03/19/23 1338    ROS Reviewed all systems and reported negative except as above     Objective:   Vitals:   11/25/24 0953 11/25/24 0955  BP: (!) 151/92 (!) 143/89  Pulse: 87   SpO2: 94%   Weight: 162 lb 6.4 oz (73.7 kg)   Height: 5' 10 (1.778 m)     Physical Exam Physical Exam GENERAL: Appropriate to age, no acute distress. HEAD EYES EARS NOSE THROAT: Moist mucous membranes, atraumatic, normocephalic. CHEST: Clear to auscultation bilaterally, no wheezing, no crackles, no rales. CARDIAC: Regular rate and rhythm, normal S1, normal S2, no murmurs, no rubs, no gallops. ABDOMEN: Soft, nontender. NEUROLOGICAL: Motor and sensation grossly intact, alert and oriented times X 3. EXTREMITIES: Warm, well perfused, no edema.     CBC     Component Value Date/Time   WBC 5.7 08/07/2024 1203   WBC 6.0 06/27/2022 0950   RBC 3.74 (L) 08/07/2024 1203   HGB 12.3 (L) 08/07/2024 1203   HGB 14.3 07/19/2016 1305   HGB 11.6 (L) 03/25/2013 0828   HCT 37.2 (L) 08/07/2024 1203   HCT 41.7 07/19/2016 1305   HCT 34.9 (L) 03/25/2013 0828   PLT 225 08/07/2024 1203   PLT 206 07/19/2016 1305   PLT 155 03/25/2013 0828   MCV 99.5 08/07/2024 1203   MCV 94 07/19/2016 1305   MCV 91.6 03/25/2013 0828   MCH 32.9 08/07/2024 1203   MCHC 33.1 08/07/2024 1203   RDW 13.1 08/07/2024 1203   RDW 12.6 07/19/2016 1305   RDW 15.5 (H) 03/25/2013 0828   LYMPHSABS 0.8 08/07/2024 1203   LYMPHSABS 0.5 (L) 07/19/2016 1305   LYMPHSABS 0.6 (L) 03/25/2013 0828   MONOABS 0.7 08/07/2024 1203   MONOABS 1.1 (H) 03/25/2013 0828   EOSABS 0.2 08/07/2024 1203   EOSABS 0.1 07/19/2016 1305   BASOSABS 0.1 08/07/2024 1203   BASOSABS 0.0 07/19/2016 1305   BASOSABS 0.0 03/25/2013 0828     Results Radiology Chest PET (10/2024): Hypermetabolic mediastinal lymphadenopathy, three metabolically active lymph nodes, suspicious for lymphoma recurrence (Independently interpreted)   PFT:    Latest Ref Rng & Units 06/16/2014    8:11 AM  PFT Results  FVC-Pre L 5.31   FVC-Predicted Pre % 107   FVC-Post L 5.29   FVC-Predicted Post % 106   Pre FEV1/FVC % % 54   Post FEV1/FCV % % 56   FEV1-Pre L 2.86   FEV1-Predicted Pre % 75   FEV1-Post L 2.93   DLCO uncorrected ml/min/mmHg 29.74   DLCO UNC% % 91   DLCO corrected ml/min/mmHg 31.46   DLCO COR %Predicted %  97   DLVA Predicted % 93   TLC L 7.28   TLC % Predicted % 104   RV % Predicted % 80          Assessment & Plan:   Assessment and Plan Assessment & Plan Mediastinal lymphadenopathy, rule out Hodgkin lymphoma recurrence Hypermetabolic mediastinal lymph nodes on PET scan suggest possible Hodgkin lymphoma recurrence. Previous treatments include CAR T therapy, and nivolumab . Differential diagnosis includes  benign lymphadenopathy, though history suggests lymphoma recurrence. - Scheduled bronchoscopy with EBUS  - Instructed to avoid aspirin  and ibuprofen  two days prior to procedure. - Advised to have someone stay with him the night of the procedure. - Provided paperwork for procedure confirmation. - Risks and benefits of bronchoscopy with EBUS discussed with the patient and his significant other.  All questions were answered.  Including risks of pneumothorax, infection and bleeding.  Yield of 90%.  Discussed known side effects including sore throat and coughing up specks of blood for the next 2 to 3 days after the procedure.   I spent 63 minutes caring for this patient today, including preparing to see the patient, obtaining a medical history , reviewing a separately obtained history, performing a medically appropriate examination and/or evaluation, counseling and educating the patient/family/caregiver, ordering medications, tests, or procedures, referring and communicating with other health care professionals (not separately reported), documenting clinical information in the electronic health record, independently interpreting results (not separately reported/billed) and communicating results to the patient/family/caregiver, and care coordination (not separately reported/billed)  MDM  I reviewed prior external note(s) from Dr. Timmy  I reviewed the result(s) of his PET scan and most recent blood work  I have ordered the case request for bronchoscopy    Zola Herter, MD [facility] Office: [phone]        [1] No Known Allergies  "

## 2024-11-25 NOTE — Progress Notes (Signed)
 " Hematology and Oncology Follow Up Visit  Brian [name] [id number] 17-Aug-1961 63 y.o. 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 daily -start on 05/29/2023 - changed to 10 mg po q day on 03/26/2024 -patient DC'd this on 05/10/2024     Interim History:  [name] is in for follow-up.  Unfortunately, it looks like he has another recurrence.  He had a PET scan that was done on 11/05/2024.  It showed that he had new hypermetabolic mediastinal lymph nodes.  These were with a SUV of 6.4.  Outside of that, everything else looked okay.  He is being seen by Pulmonary Medicine for a bronchoscopy to try to get these biopsied to confirm that this is metastatic disease.  He otherwise is managing his Parkinson's pretty well.  He has been seen by Dr. Evonnie of Neurology.  She is doing a great job and trying to manage his symptoms.  He had a nice Christmas.  He has been eating fairly well.  He has been trying to exercise.  He has had no fever.  He has had no cough.  He has had no shortness of breath.  He has had no change in bowel or bladder habits.  He has had no bleeding.  Thankfully, he is avoided COVID.  Overall, I would have to say that his performance status is probably ECOG 1.    Medications:  Current Outpatient Medications:    ascorbic acid (VITAMIN C)  500 MG tablet, Take 500 mg by mouth daily., Disp: , Rfl:    carbidopa -levodopa  (SINEMET  CR) 50-200 MG tablet, TAKE 1 TABLET BY MOUTH AT BEDTIME, Disp: 90 tablet, Rfl: 0   carbidopa -levodopa  (SINEMET  IR) 25-100 MG tablet, TAKE 2 TABLETS BY MOUTH AT 7AM, 2 TABLETS AT 11AM, 2 TABLETS AT 3PM AND 2 TABLETS AT 7PM, Disp: 720 tablet, Rfl: 0   cholecalciferol  (VITAMIN D3) 25 MCG (1000 UNIT) tablet, Take 1,000 Units by mouth daily., Disp: , Rfl:    diphenhydramine -acetaminophen  (TYLENOL  PM) 25-500 MG TABS tablet, Take 1 tablet by mouth at bedtime as needed., Disp: , Rfl:    LORazepam  (ATIVAN ) 0.5 MG tablet, TAKE 1 TABLET(0.5 MG) BY MOUTH EVERY 6 HOURS AS NEEDED FOR ANXIETY, Disp: 20 tablet, Rfl: 1   pramipexole  (MIRAPEX ) 0.5 MG tablet, TAKE 1 TABLET(0.5 MG) BY MOUTH THREE TIMES DAILY, Disp: 270 tablet, Rfl: 0   tadalafil  (CIALIS ) 5 MG tablet, Take 1 tablet (5 mg total) by mouth daily., Disp: 30 tablet, Rfl: 11 No current facility-administered medications for this visit.  Facility-Administered Medications Ordered in Other Visits:    sodium chloride  flush (NS) 0.9 % injection 10 mL, 10 mL, Intracatheter, PRN, Demir Titsworth R, MD, 10 mL at 03/19/23 1338  Allergies: No Known Allergies  Past Medical History, Surgical history, Social history, and Family History were reviewed  and updated.  Review of Systems: Review of Systems  Neurological:  Positive for tremors.  All other systems reviewed and are negative.    Physical Exam:  height is 5' 10 (1.778 m) and weight is 162 lb 1.9 oz (73.5 kg). His oral temperature is 98.5 F (36.9 C). His blood pressure is 99/73 and his pulse is 89. His respiration is 20 and oxygen saturation is 98%.   Physical Exam Vitals reviewed.  HENT:     Head: Normocephalic and atraumatic.  Eyes:     Pupils: Pupils are equal, round, and reactive to light.  Cardiovascular:     Rate and Rhythm: Normal rate and regular rhythm.     Heart sounds: Normal heart sounds.  Pulmonary:      Effort: Pulmonary effort is normal.     Breath sounds: Normal breath sounds.  Abdominal:     General: Bowel sounds are normal.     Palpations: Abdomen is soft.  Musculoskeletal:        General: No tenderness or deformity. Normal range of motion.     Cervical back: Normal range of motion.     Comments: There may be a little bit of swelling in the right lower leg.  I cannot palpate any obvious venous cord.  There may be a questionable Homans' sign.  Lymphadenopathy:     Cervical: No cervical adenopathy.  Skin:    General: Skin is warm and dry.     Findings: No erythema or rash.  Neurological:     Mental Status: He is alert and oriented to person, place, and time.  Psychiatric:        Behavior: Behavior normal.        Thought Content: Thought content normal.        Judgment: Judgment normal.     Lab Results  Component Value Date   WBC 4.1 11/25/2024   HGB 13.2 11/25/2024   HCT 40.2 11/25/2024   MCV 96.6 11/25/2024   PLT 194 11/25/2024     Chemistry      Component Value Date/Time   NA 140 11/25/2024 1135   NA 142 04/02/2017 0000   NA 140 07/19/2016 1305   K 4.7 11/25/2024 1135   K 4.4 07/19/2016 1305   CL 105 11/25/2024 1135   CL 105 02/08/2016 1117   CL 107 03/25/2013 0828   CO2 24 11/25/2024 1135   CO2 26 07/19/2016 1305   BUN 36 (H) 11/25/2024 1135   BUN 21 04/02/2017 0000   BUN 22.8 07/19/2016 1305   CREATININE 1.15 11/25/2024 1135   CREATININE 1.1 07/19/2016 1305   GLU 105 04/02/2017 0000      Component Value Date/Time   CALCIUM  9.1 11/25/2024 1135   CALCIUM  9.8 07/19/2016 1305   ALKPHOS 79 11/25/2024 1135   ALKPHOS 76 07/19/2016 1305   AST 26 11/25/2024 1135   AST 18 07/19/2016 1305   ALT 6 11/25/2024 1135   ALT 14 07/19/2016 1305   BILITOT 0.4 11/25/2024 1135   BILITOT 0.70 07/19/2016 1305      Impression and Plan: [name] is a 63 year old gentleman.  He has history of recurrent Hodgkin's disease. He underwent a autologous stem cell  transplant back in May of 2014. He then had a recurrence after this.  He did undergo CAR-T therapy at [facility].  I believe he had this in April 2017.  He did well with this.  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  This was  proven by biopsy.  He had radiation therapy for this.  He subsequently has had another recurrence.  This was in the right axilla.  This was biopsy-proven.  He did undergo radiation therapy for this.  This was completed in September/2023.  Now, we have had another recurrence.  He completed 6 cycles of chemotherapy with ANVD in May 2024.    He is now relapsed.  I suspect he probably has relapse.  We will see what the biopsy shows.  As far as further therapy, I know that we are running out of options.  I think the fact that he has not had chemotherapy with nivolumab  for almost 2 years is certainly encouraging.  I suppose we could might be able to go back to that protocol.  We also could try him on gemcitabine/Navelbine/Doxil .  This I think would be reasonable.  Again, I will have to see what the biopsy shows.  Once have the biopsy results back, then we will plan to get him back and then see how we can treat this.    Maude JONELLE Crease, MD 12/31/20254:18 PM     "

## 2024-11-25 NOTE — Telephone Encounter (Signed)
 Please schedule the following:  Provider performing procedure:Vanisha Whiten Diagnosis: Mediastinal LAD Which side if for nodule / mass? Bilateral Procedure: Flexible bronch + EBUS  Has patient been spoken to by Provider and given informed consent? Yes Anesthesia: General Do you need Fluro? No Duration of procedure: 45 minutes Date: 12/11/23  Time: N/A Location: [facility] endo Does patient have OSA? No DM? No Or Latex allergy? No Medication Restriction/ Anticoagulate/Antiplatelet: None Pre-op Labs Ordered:determined by Anesthesia Imaging request: None  (If, SuperDimension CT Chest, please have STAT courier sent to ENDO)

## 2024-11-25 NOTE — Telephone Encounter (Signed)
 Letter given to Brian [name]. Routing to [employer] for [employer]

## 2024-11-25 NOTE — H&P (View-Only) (Signed)
 "   Subjective:   PATIENT ID: Brian [name] GENDER: male DOB: [birth year], MRN: [id number]   HPI Discussed the use of AI scribe software for clinical note transcription with the patient, who gave verbal consent to proceed.  History of Present Illness Brian [name] is a 63 year old male with recurrent Hodgkin's lymphoma who presents with hypermetabolic mediastinal lymph nodes on PET scan. He was referred by his oncologist, Dr. Corrin, for evaluation of hypermetabolic mediastinal lymph nodes.  He has a history of recurrent Hodgkin's lymphoma, having undergone stem cell transplant, CAR T therapy and subsequently relapsed. He has been on nivolumab  every three months, completing eighteen cycles, but this is currently on hold. A PET scan in December 2025 showed hypermetabolic mediastinal lymph nodes.  He has Parkinson's disease, which he describes as 'awful,' with symptoms impacting his daily life, including lightheadedness and difficulty driving, leading to early retirement due to disability. He occasionally takes aspirin  for pain.  He was previously on Xarelto  for blood clots in his right leg, but this was discontinued in June 2025.  He lives with his son, who also had a cold, but neither has been tested for any infections. He reports having a cold since Christmas Day, with symptoms of tiredness, nasal congestion, and a mild cough. No sleep apnea, but occasional snoring is reported.     Past Medical History:  Diagnosis Date   Anxiety    Dysrhythmia    past hx pvc   Goals of care, counseling/discussion 09/25/2018   H/O autologous stem cell transplant (HCC)    may 2014  at [facility]   History of Bell's palsy    2009  RIGHT SIDE--  HAS 80% FUNCTION / PT STATES A LITTLE ASYMETRICAL AND EFFECTS MOUTH   History of radiation therapy 10/18/17-11/05/17   sprine T1 26 Gy in 13 fractions, spine boost 10 Gy in 5 fractions   History of radiation therapy    Right hip, Rt Sclav- 07/23/22-08/07/11- Dr. 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   Mass of right chest wall 09/18/2018   Mass of right inguinal region    Neuromuscular disorder (HCC)    bilateral feet neuropathy   PVC (premature ventricular contraction)    benign   TIA (transient ischemic attack) 02/2015   probable TIA   Wears contact lenses      Family History  Problem Relation Age of Onset   Cancer Mother 10       ovarian   Ovarian cancer Mother 61   Heart disease Father    Stroke Father    Cancer Brother        testicular   Hypertension Brother    Healthy Son    Healthy Son    Hypertension Brother    Other Brother        CMT   Hypertension Brother      Social History   Socioeconomic History   Marital status: Divorced    Spouse name: Not on file   Number of children: 2   Years of education: Not on file   Highest education level: Bachelor's degree (e.g., BA, AB, BS)  Occupational History   Occupation: Best Boy: [employer]  Tobacco Use   Smoking status: Never  Smokeless tobacco: Never  Vaping Use   Vaping status: Never Used  Substance and Sexual Activity   Alcohol use: Yes    Alcohol/week: 14.0 standard drinks of alcohol    Types: 7 Glasses of wine, 7 Cans of beer per week    Comment: one drink per night   Drug use: No   Sexual activity: Not Currently  Other Topics Concern   Not on file  Social History Narrative   DIVORCE. Education: [employer]. Exercise: jog/walk/ lift weights 7 days a week, 1-2 miles.   Right handed   2 children   Social Drivers of Health   Tobacco Use: Low Risk (11/25/2024)   Patient History    Smoking Tobacco Use: Never    Smokeless Tobacco Use: Never    Passive Exposure: Not on file  Financial Resource Strain: Low Risk (04/26/2024)   Overall  Financial Resource Strain (CARDIA)    Difficulty of Paying Living Expenses: Not very hard  Food Insecurity: No Food Insecurity (04/26/2024)   Hunger Vital Sign    Worried About Running Out of Food in the Last Year: Never true    Ran Out of Food in the Last Year: Never true  Transportation Needs: No Transportation Needs (04/26/2024)   PRAPARE - Administrator, Civil Service (Medical): No    Lack of Transportation (Non-Medical): No  Physical Activity: Sufficiently Active (04/26/2024)   Exercise Vital Sign    Days of Exercise per Week: 4 days    Minutes of Exercise per Session: 40 min  Stress: Stress Concern Present (04/26/2024)   [employer] of Occupational Health - Occupational Stress Questionnaire    Feeling of Stress : To some extent  Social Connections: Moderately Integrated (04/26/2024)   Social Connection and Isolation Panel    Frequency of Communication with Friends and Family: More than three times a week    Frequency of Social Gatherings with Friends and Family: Once a week    Attends Religious Services: More than 4 times per year    Active Member of [employer] or Organizations: Yes    Attends Banker Meetings: More than 4 times per year    Marital Status: Divorced  Intimate Partner Violence: Not At Risk (01/25/2023)   Humiliation, Afraid, Rape, and Kick questionnaire    Fear of Current or Ex-Partner: No    Emotionally Abused: No    Physically Abused: No    Sexually Abused: No  Depression (PHQ2-9): Low Risk (08/07/2024)   Depression (PHQ2-9)    PHQ-2 Score: 0  Alcohol Screen: Low Risk (04/26/2024)   Alcohol Screen    Last Alcohol Screening Score (AUDIT): 3  Housing: Unknown (04/26/2024)   Housing Stability Vital Sign    Unable to Pay for Housing in the Last Year: No    Number of Times Moved in the Last Year: Not on file    Homeless in the Last Year: No  Utilities: Not At Risk (01/25/2023)   AHC Utilities    Threatened with loss of utilities: No  Health  Literacy: Not on file     Allergies[1]   Outpatient Medications Prior to Visit  Medication Sig Dispense Refill   ascorbic acid (VITAMIN C) 500 MG tablet Take 500 mg by mouth daily.     carbidopa -levodopa  (SINEMET  CR) 50-200 MG tablet TAKE 1 TABLET BY MOUTH AT BEDTIME 90 tablet 0   carbidopa -levodopa  (SINEMET  IR) 25-100 MG tablet TAKE 2 TABLETS BY MOUTH AT 7AM, 2 TABLETS AT 11AM, 2 TABLETS  AT 3PM AND 2 TABLETS AT 7PM 720 tablet 0   cholecalciferol  (VITAMIN D3) 25 MCG (1000 UNIT) tablet Take 1,000 Units by mouth daily.     diphenhydramine -acetaminophen  (TYLENOL  PM) 25-500 MG TABS tablet Take 1 tablet by mouth at bedtime as needed.     LORazepam  (ATIVAN ) 0.5 MG tablet TAKE 1 TABLET(0.5 MG) BY MOUTH EVERY 6 HOURS AS NEEDED FOR ANXIETY 20 tablet 1   pramipexole  (MIRAPEX ) 0.5 MG tablet TAKE 1 TABLET(0.5 MG) BY MOUTH THREE TIMES DAILY 270 tablet 0   tadalafil  (CIALIS ) 5 MG tablet Take 1 tablet (5 mg total) by mouth daily. 30 tablet 11   Facility-Administered Medications Prior to Visit  Medication Dose Route Frequency Provider Last Rate Last Admin   sodium chloride  flush (NS) 0.9 % injection 10 mL  10 mL Intracatheter PRN Timmy Maude SAUNDERS, MD   10 mL at 03/19/23 1338    ROS Reviewed all systems and reported negative except as above     Objective:   Vitals:   11/25/24 0953 11/25/24 0955  BP: (!) 151/92 (!) 143/89  Pulse: 87   SpO2: 94%   Weight: 162 lb 6.4 oz (73.7 kg)   Height: 5' 10 (1.778 m)     Physical Exam Physical Exam GENERAL: Appropriate to age, no acute distress. HEAD EYES EARS NOSE THROAT: Moist mucous membranes, atraumatic, normocephalic. CHEST: Clear to auscultation bilaterally, no wheezing, no crackles, no rales. CARDIAC: Regular rate and rhythm, normal S1, normal S2, no murmurs, no rubs, no gallops. ABDOMEN: Soft, nontender. NEUROLOGICAL: Motor and sensation grossly intact, alert and oriented times X 3. EXTREMITIES: Warm, well perfused, no edema.     CBC     Component Value Date/Time   WBC 5.7 08/07/2024 1203   WBC 6.0 06/27/2022 0950   RBC 3.74 (L) 08/07/2024 1203   HGB 12.3 (L) 08/07/2024 1203   HGB 14.3 07/19/2016 1305   HGB 11.6 (L) 03/25/2013 0828   HCT 37.2 (L) 08/07/2024 1203   HCT 41.7 07/19/2016 1305   HCT 34.9 (L) 03/25/2013 0828   PLT 225 08/07/2024 1203   PLT 206 07/19/2016 1305   PLT 155 03/25/2013 0828   MCV 99.5 08/07/2024 1203   MCV 94 07/19/2016 1305   MCV 91.6 03/25/2013 0828   MCH 32.9 08/07/2024 1203   MCHC 33.1 08/07/2024 1203   RDW 13.1 08/07/2024 1203   RDW 12.6 07/19/2016 1305   RDW 15.5 (H) 03/25/2013 0828   LYMPHSABS 0.8 08/07/2024 1203   LYMPHSABS 0.5 (L) 07/19/2016 1305   LYMPHSABS 0.6 (L) 03/25/2013 0828   MONOABS 0.7 08/07/2024 1203   MONOABS 1.1 (H) 03/25/2013 0828   EOSABS 0.2 08/07/2024 1203   EOSABS 0.1 07/19/2016 1305   BASOSABS 0.1 08/07/2024 1203   BASOSABS 0.0 07/19/2016 1305   BASOSABS 0.0 03/25/2013 0828     Results Radiology Chest PET (10/2024): Hypermetabolic mediastinal lymphadenopathy, three metabolically active lymph nodes, suspicious for lymphoma recurrence (Independently interpreted)   PFT:    Latest Ref Rng & Units 06/16/2014    8:11 AM  PFT Results  FVC-Pre L 5.31   FVC-Predicted Pre % 107   FVC-Post L 5.29   FVC-Predicted Post % 106   Pre FEV1/FVC % % 54   Post FEV1/FCV % % 56   FEV1-Pre L 2.86   FEV1-Predicted Pre % 75   FEV1-Post L 2.93   DLCO uncorrected ml/min/mmHg 29.74   DLCO UNC% % 91   DLCO corrected ml/min/mmHg 31.46   DLCO COR %Predicted %  97   DLVA Predicted % 93   TLC L 7.28   TLC % Predicted % 104   RV % Predicted % 80          Assessment & Plan:   Assessment and Plan Assessment & Plan Mediastinal lymphadenopathy, rule out Hodgkin lymphoma recurrence Hypermetabolic mediastinal lymph nodes on PET scan suggest possible Hodgkin lymphoma recurrence. Previous treatments include CAR T therapy, and nivolumab . Differential diagnosis includes  benign lymphadenopathy, though history suggests lymphoma recurrence. - Scheduled bronchoscopy with EBUS  - Instructed to avoid aspirin  and ibuprofen  two days prior to procedure. - Advised to have someone stay with him the night of the procedure. - Provided paperwork for procedure confirmation. - Risks and benefits of bronchoscopy with EBUS discussed with the patient and his significant other.  All questions were answered.  Including risks of pneumothorax, infection and bleeding.  Yield of 90%.  Discussed known side effects including sore throat and coughing up specks of blood for the next 2 to 3 days after the procedure.   I spent 63 minutes caring for this patient today, including preparing to see the patient, obtaining a medical history , reviewing a separately obtained history, performing a medically appropriate examination and/or evaluation, counseling and educating the patient/family/caregiver, ordering medications, tests, or procedures, referring and communicating with other health care professionals (not separately reported), documenting clinical information in the electronic health record, independently interpreting results (not separately reported/billed) and communicating results to the patient/family/caregiver, and care coordination (not separately reported/billed)  MDM  I reviewed prior external note(s) from Dr. Timmy  I reviewed the result(s) of his PET scan and most recent blood work  I have ordered the case request for bronchoscopy    Zola Herter, MD [facility] Office: [phone]        [1] No Known Allergies  "

## 2024-11-27 ENCOUNTER — Telehealth: Payer: Self-pay

## 2024-11-27 ENCOUNTER — Other Ambulatory Visit: Payer: Self-pay | Admitting: *Deleted

## 2024-11-27 NOTE — Telephone Encounter (Signed)
 Copied from CRM [id number]. Topic: Clinical - Medication Question >> Nov 27, 2024  2:31 PM Aisha D wrote: Reason for CRM: Pt wants to get a refill for the LORazepam  (ATIVAN ) 0.5 MG tablet but wants to know if Dr.Copland could prescribed a larger dose because the medication runs out too fast. Pt would like a callback with an update.

## 2024-11-27 NOTE — Telephone Encounter (Signed)
 Incorrect encounter

## 2024-11-30 ENCOUNTER — Telehealth: Payer: Self-pay | Admitting: Hematology & Oncology

## 2024-11-30 NOTE — Telephone Encounter (Signed)
 Called to schedule infusion per inbasket. LVM to return call for scheduling.

## 2024-12-03 ENCOUNTER — Inpatient Hospital Stay: Attending: Hematology & Oncology

## 2024-12-03 ENCOUNTER — Telehealth: Payer: Self-pay

## 2024-12-03 ENCOUNTER — Encounter: Payer: Self-pay | Admitting: Hematology & Oncology

## 2024-12-03 VITALS — BP 152/92 | HR 90 | Temp 98.1°F | Resp 18

## 2024-12-03 DIAGNOSIS — R634 Abnormal weight loss: Secondary | ICD-10-CM

## 2024-12-03 DIAGNOSIS — K909 Intestinal malabsorption, unspecified: Secondary | ICD-10-CM

## 2024-12-03 MED ORDER — IRON SUCROSE 300 MG IVPB - SIMPLE MED
300.0000 mg | Freq: Once | Status: AC
  Administered 2024-12-03: 300 mg via INTRAVENOUS
  Filled 2024-12-03: qty 300

## 2024-12-03 MED ORDER — SODIUM CHLORIDE 0.9 % IV SOLN: Freq: Once | INTRAVENOUS | Status: AC

## 2024-12-03 NOTE — Telephone Encounter (Signed)
 Copied from CRM [id number]. Topic: Clinical - Medical Advice >> Dec 02, 2024  4:19 PM Devaughn RAMAN wrote: Reason for CRM: Pt has procedure scheduled with Dr.Hattar on next week 1/15 and is scheduled to get iron  infusions soon and would like to know if this will interfere with his procedure or if it is okay to continue with iron  infusion. Contacted [facility] [place] spoke with Debbie, pt disconnected call while on hold. Please f/u with pt. >> Dec 03, 2024  8:52 AM Isabell A wrote: Spoke with Vanessa [phone] at the [facility]fusion [facility] patient is scheduled at - she advised it will not interfere with upcoming procedure, left voicemail on patients phone.   >> Dec 03, 2024  8:38 AM Isabell A wrote: Patient has called several times trying to find out if his infusion appointment this morning will interfere with his upcoming bronchy procedure, attempt to call [facility] several times with no answer.   Please call patient.  >> Dec 03, 2024  7:40 AM Benton KIDD wrote: Patient has appointment at 9:30 this morning and is needing to know this information please give patient a call back  >> Dec 03, 2024  7:38 AM Benton O wrote: Patient is calling back concerning taking his iron  infusion shot . No one has got back to him yet . Please give patient a call back  [phone]  I sent a secure chat to Dr. Zaida since he is working at the hospital currently asking about the iron  infusions and it was confirmed that it is fine to continue the iron  infusions.

## 2024-12-03 NOTE — Telephone Encounter (Signed)
 Copied from CRM #[id number]. Topic: Clinical - Medical Advice >> Dec 02, 2024 11:58 AM Brian [name] wrote: Reason for CRM: Patient has a bronchoscopy with Dr. Zaida on 1/15 but currently takes iron  infusions and would like to confirm if this is okay and wont interfere with procedure.   Please call patient back to advise.   Please advise Dr. Zaida

## 2024-12-03 NOTE — Telephone Encounter (Signed)
 I ATC patient x1.  LVM to return call.  I sent a mychart message as well to let him know it was fine to continue the iron  infusions.  I did ask that he either respond to the message or return our call so we know he received our message.

## 2024-12-04 NOTE — Telephone Encounter (Signed)
 Patient aware.NFN

## 2024-12-08 ENCOUNTER — Encounter (HOSPITAL_COMMUNITY): Payer: Self-pay

## 2024-12-08 ENCOUNTER — Other Ambulatory Visit: Payer: Self-pay

## 2024-12-08 NOTE — Progress Notes (Signed)
 PCP - Copland, Harlene BROCKS, MD  Cardiologist -  Neurology Tat, Asberry RAMAN, DO  Oncology Timmy Maude SAUNDERS, MD   PPM/ICD - denies Device Orders - n/a Rep Notified - n/a  Chest x-ray -  EKG - DOS Stress Test - 03-11-15 ECHO - 03-18-13 Cardiac Cath -   CPAP - denies  GLP-1 -denies  DM-denies  Blood Thinner Instructions: denies Aspirin  Instructions: denies  ERAS Protcol - NPO  COVID TEST- n/a  Anesthesia review: yes  Patient verbally denies any shortness of breath, fever, cough and chest pain during phone call   -------------  SDW INSTRUCTIONS given:  Your procedure is scheduled on December 10, 2024.  Report to [facility] Main Entrance A at 6:30 A.M., and check in at the Admitting office.  Call this number if you have problems the morning of surgery:  [phone]   Remember:  Do not eat or drink after midnight the night before your surgery      Take these medicines the morning of surgery with A SIP OF WATER  carbidopa -levodopa  (SINEMET  IR)  LORazepam  (ATIVAN )  pramipexole  (MIRAPEX )     As of today, STOP taking any Aspirin  (unless otherwise instructed by your surgeon) Aleve, Naproxen, Ibuprofen , Motrin , Advil , Goody's, BC's, all herbal medications, fish oil, and all vitamins.                      Do not wear jewelry, make up, or nail polish            Do not wear lotions, powders, perfumes/colognes, or deodorant.            Do not shave 48 hours prior to surgery.  Men may shave face and neck.            Do not bring valuables to the hospital.            [facility] is not responsible for any belongings or valuables.  Do NOT Smoke (Tobacco/Vaping) 24 hours prior to your procedure If you use a CPAP at night, you may bring all equipment for your overnight stay.   Contacts, glasses, dentures or bridgework may not be worn into surgery.      For patients admitted to the hospital, discharge time will be determined by your treatment team.   Patients discharged the  day of surgery will not be allowed to drive home, and someone needs to stay with them for 24 hours.    Special instructions:   [facility]- Preparing For Surgery  Before surgery, you can play an important role. Because skin is not sterile, your skin needs to be as free of germs as possible. You can reduce the number of germs on your skin by washing with CHG (chlorahexidine gluconate) Soap before surgery.  CHG is an antiseptic cleaner which kills germs and bonds with the skin to continue killing germs even after washing.    Oral Hygiene is also important to reduce your risk of infection.  Remember - BRUSH YOUR TEETH THE MORNING OF SURGERY WITH YOUR REGULAR TOOTHPASTE  Please do not use if you have an allergy to CHG or antibacterial soaps. If your skin becomes reddened/irritated stop using the CHG.  Do not shave (including legs and underarms) for at least 48 hours prior to first CHG shower. It is OK to shave your face.  Please follow these instructions carefully.   Shower the NIGHT BEFORE SURGERY and the MORNING OF SURGERY with DIAL Soap.   Bruna  yourself dry with a CLEAN TOWEL.  Wear CLEAN PAJAMAS to bed the night before surgery  Place CLEAN SHEETS on your bed the night of your first shower and DO NOT SLEEP WITH PETS.   Day of Surgery: Please shower morning of surgery  Wear Clean/Comfortable clothing the morning of surgery Do not apply any deodorants/lotions.   Remember to brush your teeth WITH YOUR REGULAR TOOTHPASTE.   Questions were answered. Patient verbalized understanding of instructions.

## 2024-12-08 NOTE — Progress Notes (Unsigned)
 [facility] [address] [place], [place] [address] [phone]  Date:  12/09/2024   Name:  Brian [name]   DOB:  May 04, 1961   MRN:  [id number]  PCP:  Brian Harlene BROCKS, MD    Chief Complaint: No chief complaint on file.   History of Present Illness:  Brian [name] is a 64 y.o. very pleasant male patient who presents with the following:  Patient seen today for follow-up visit.  He recently reach out to me with concern about anxiety and I asked him to please schedule an appointment Our most recent visit together was in June He has history of Hodgkin's disease unfortunately status post multiple recurrences, Parkinson's disease, hypertension, prediabetes, history of DVT, chronic back pain From a Hodgkin's disease standpoint he had a stem cell transplant in 2014, then had a recurrence.  He had CAR-T therapy at [facility] in 2017 and did well until another relapse in his spine, subsequent radiation treatment.  He had another recurrence in 2024 and did 6 rounds of chemotherapy, another recurrence noted on PET scan in December 2025  Most recent hematology visit 12/31 with Brian [name] Interim History:  [name] is in for follow-up.  Unfortunately, it looks like he has another recurrence.  He had a PET scan that was done on 11/05/2024.  It showed that he had new hypermetabolic mediastinal lymph nodes.  These were with a SUV of 6.4.  Outside of that, everything else looked okay. He is being seen by Pulmonary Medicine for a bronchoscopy to try to get these biopsied to confirm that this is metastatic disease. He otherwise is managing his Parkinson's pretty well.  He has been seen by Brian [name] of Neurology./////////////////// He is now relapsed.  I suspect he probably has relapse.  We will see what the biopsy shows. As far as further therapy, I know that we are running out of options.  I think the fact that he has not had chemotherapy  with nivolumab  for almost 2 years is certainly encouraging.  I suppose we could might be able to go back to that protocol. We also could try him on gemcitabine/Navelbine/Doxil .  This I think would be reasonable. Again, I will have to see what the biopsy shows.  Pulmonology plans to do a bronchoscopy and biopsy tomorrow  He was seen by Brian [name] with neurology on December 16: 1.  Parkinsons Disease             -Continue carbidopa /levodopa  25/100 and take 2 tablets at 7 AM, 2 tablets at 11 AM, 2 tablet at 3 PM, 2 tablet at 7 PM.  He's taking an extra pill 3-4 days/week             -Continue carbidopa /levodopa  50/200 at bedtime.             -started inbrija .  Shown how to use.  Samples given.  Call me and let me know how he is doing.               -discussed rytary /crexont  but worry he won't be happy with it.  It would offer potentially more on time with less dyskinesia but I find that pts who are used to IR only don't feel that they move as well             -Discussed stopping entacapone  due to hallucinations.  Will wean by decreasing a pill per  week and then e/c.   If that does not help, we will likely have to wean off of pramipexole  will             -Continue pramipexole  0.5 mg 3 times per day.  Higher dosages caused hallucinations.             -will consider inbrija  in the future             - Discussed several times this visit but I thought he needed to use his walker at all times.             -Patient sought second opinion at [facility] with Dr. Hans in May, 2023.  There was no significant changes in medication.             -he asks several times today about going to see Dr. Pablo at [facility].  Discussed that I don't have objection but think that the suggestions are going to be some of the same ones that we have given but he has opted not to take at this point.             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  Patient has  synkinetic reinnervation of the right face.  He currently just has a prescription for lorazepam  0.5 every 6 hours as needed  Discussed the use of AI scribe software for clinical note transcription with the patient, who gave verbal consent to proceed.  History of Present Illness        Patient Active Problem List   Diagnosis Date Noted   Prediabetes 09/17/2023   Right shoulder pain 06/18/2023   Acute deep vein thrombosis (DVT) of proximal vein of right lower extremity (HCC) 06/18/2023   Malabsorption of iron  03/01/2023   Primary hypertension 09/12/2022   Closed fracture of right proximal humerus 10/03/2021   Traumatic complete tear of right rotator cuff 10/03/2021   Lumbar radiculopathy 10/03/2021   Goals of care, counseling/discussion 09/25/2018   Mass of right chest wall 09/18/2018   PD (Parkinson's disease) (HCC) 11/27/2016   Lymphadenopathy 07/28/2015   Mediastinal adenopathy 07/14/2015   Aphasia 03/04/2015   Slurred speech 03/04/2015   Pleural mass 05/31/2014   Abdominal pain, epigastric 02/02/2014   Weight loss 08/12/2012   H/O Bell's palsy 08/12/2012   Hodgkin's disease 08/09/2011    Past Medical History:  Diagnosis Date   Anxiety    Dysrhythmia    past hx pvc   Goals of care, counseling/discussion 09/25/2018   H/O autologous stem cell transplant (HCC)    may 2014  at [facility]   History of Bell's palsy    2009  RIGHT SIDE--  HAS 80% FUNCTION / PT STATES A LITTLE ASYMETRICAL AND EFFECTS MOUTH   History of radiation therapy 10/18/17-11/05/17   sprine T1 26 Gy in 13 fractions, spine boost 10 Gy in 5 fractions   History of radiation therapy    Right hip, Rt Sclav- 07/23/22-08/07/11- Dr. Lynwood [name]   Hodgkin's disease, nodular sclerosis, of inguinal region/lower limb (HCC) ONOLOGIST--  DR Brian [name] AND [facility]     SALVAGE CHEMO 2013/  AUTOLOGUS STEM CELL TRANSPLANT MAY 2014 AT [facility]   Hypertension    Malabsorption of iron  03/01/2023   Mass of right chest wall  09/18/2018   Mass of right inguinal region    Neuromuscular disorder (HCC)  bilateral feet neuropathy   PVC (premature ventricular contraction)    benign   TIA (transient ischemic attack) 02/2015   probable TIA   Wears contact lenses     Past Surgical History:  Procedure Laterality Date   AXILLARY LYMPH NODE BIOPSY Left 02/02/2013   Procedure: NEEDLE LOCALIZED AXILLARY LYMPH NODE BIOPSY;  Surgeon: Sherlean JINNY Laughter, MD;  Location: [facility];  Service: General;  Laterality: Left;   BACK SURGERY  03/2021   BONE MARROW BIOPSY  08/26/2012   COLONOSCOPY  2021   CYST REMOVAL NECK Left 11/26/1978   IR IMAGING GUIDED PORT INSERTION  10/23/2022   IR REMOVAL TUN ACCESS W/ PORT W/O FL MOD SED  04/09/2024   LEFT INGUINAL LYMPH NODE BX  09/09/2012   LYMPH NODE BIOPSY N/A 07/28/2015   Procedure: LYMPH NODE BIOPSY;  Surgeon: Elspeth JAYSON Millers, MD;  [facility] OR;  Service: Thoracic;  Laterality: N/A;   LYMPH NODE BIOPSY Right 06/29/2022   Procedure: RIGHT AXILLARY NODE BIOPSY;  Surgeon: Millers Elspeth JAYSON, MD;  Location: [facility] OR;  Service: Vascular;  Laterality: Right;   MASS EXCISION Right 09/19/2018   Procedure: EXCISION OF RIGHT CHEST WALL MASS ERAS PATHWAY;  Surgeon: Gail Favorite, MD;  Location: [facility];  Service: General;  Laterality: Right;   NODE DISSECTION Right 07/28/2015   Procedure: NODE DISSECTION;  Surgeon: Elspeth JAYSON Millers, MD;  Location: [facility] OR;  Service: Thoracic;  Laterality: Right;   PLEURA BIOPSY Left 05/31/2014   REMOVAL RIGHT INGUINAL LYMPH NODES  08/16/2011   SCROTAL EXPLORATION Right 01/04/2014   Procedure: SCROTUM EXPLORATION   INGUINAL , EXCISION OF CYSTIC MASS OF RIGHT SPERMATIC CORD, WITH FROZEN SECTION;  Surgeon: Garnette Shack, MD;  Location: [facility];  Service: Urology;  Laterality: Right;   TRANSTHORACIC ECHOCARDIOGRAM  03/18/2013   MILD LVH/  EF 55-60%   VIDEO ASSISTED THORACOSCOPY Left  05/31/2014   Procedure: LEFT VIDEO ASSISTED THORACOSCOPY, PLEURAL BIOPSY;  Surgeon: Elspeth JAYSON Millers, MD;  Location: [facility] OR;  Service: Thoracic;  Laterality: Left;   VIDEO ASSISTED THORACOSCOPY Right 07/28/2015   Procedure: RIGHT VIDEO ASSISTED THORACOSCOPY;  Surgeon: Elspeth JAYSON Millers, MD;  Location: [facility] OR;  Service: Thoracic;  Laterality: Right;   VIDEO ASSISTED THORACOSCOPY (VATS)/ LYMPH NODE SAMPLING Right 07/28/2015   VIDEO ASSISTED THORACOSCOPY (VATS)/WEDGE RESECTION  05/31/2014   VIDEO BRONCHOSCOPY WITH ENDOBRONCHIAL ULTRASOUND  07/28/2015   VIDEO BRONCHOSCOPY WITH ENDOBRONCHIAL ULTRASOUND N/A 07/28/2015   Procedure: VIDEO BRONCHOSCOPY WITH ENDOBRONCHIAL ULTRASOUND;  Surgeon: Elspeth JAYSON Millers, MD;  Location: [facility] OR;  Service: Thoracic;  Laterality: N/A;    Social History[1]  Family History  Problem Relation Age of Onset   Cancer Mother 67       ovarian   Ovarian cancer Mother 51   Heart disease Father    Stroke Father    Cancer Brother        testicular   Hypertension Brother    Healthy Son    Healthy Son    Hypertension Brother    Other Brother        CMT   Hypertension Brother     Allergies[2]  Medication list has been reviewed and updated.  Medications Ordered Prior to Encounter[3]  Review of Systems:  As per HPI- otherwise negative.   Physical Examination: There were no vitals filed for this visit. There were no vitals filed for this visit. There is no height or weight on file to calculate BMI. Ideal  Body Weight:    GEN: no acute distress. HEENT: Atraumatic, Normocephalic.  Ears and Nose: No external deformity. CV: RRR, No M/G/R. No JVD. No thrill. No extra heart sounds. PULM: CTA B, no wheezes, crackles, rhonchi. No retractions. No resp. distress. No accessory muscle use. ABD: S, NT, ND, +BS. No rebound. No HSM. EXTR: No c/c/e PSYCH: Normally interactive. Conversant.    Assessment and Plan: No diagnosis found.  Assessment &  Plan   Signed Harlene Schroeder, MD    [1]  Social History Tobacco Use   Smoking status: Never   Smokeless tobacco: Never  Vaping Use   Vaping status: Never Used  Substance Use Topics   Alcohol use: Yes    Alcohol/week: 14.0 standard drinks of alcohol    Types: 7 Glasses of wine, 7 Cans of beer per week    Comment: one drink per night   Drug use: No  [2] No Known Allergies [3]  Current Outpatient Medications on File Prior to Visit  Medication Sig Dispense Refill   ascorbic acid (VITAMIN C) 500 MG tablet Take 500 mg by mouth daily.     carbidopa -levodopa  (SINEMET  CR) 50-200 MG tablet TAKE 1 TABLET BY MOUTH AT BEDTIME 90 tablet 0   carbidopa -levodopa  (SINEMET  IR) 25-100 MG tablet TAKE 2 TABLETS BY MOUTH AT 7AM, 2 TABLETS AT 11AM, 2 TABLETS AT 3PM AND 2 TABLETS AT 7PM 720 tablet 0   cholecalciferol  (VITAMIN D3) 25 MCG (1000 UNIT) tablet Take 1,000 Units by mouth daily.     diphenhydramine -acetaminophen  (TYLENOL  PM) 25-500 MG TABS tablet Take 1 tablet by mouth at bedtime as needed.     LORazepam  (ATIVAN ) 0.5 MG tablet TAKE 1 TABLET(0.5 MG) BY MOUTH EVERY 6 HOURS AS NEEDED FOR ANXIETY 20 tablet 1   pramipexole  (MIRAPEX ) 0.5 MG tablet TAKE 1 TABLET(0.5 MG) BY MOUTH THREE TIMES DAILY 270 tablet 0   tadalafil  (CIALIS ) 5 MG tablet Take 1 tablet (5 mg total) by mouth daily. 30 tablet 11   Current Facility-Administered Medications on File Prior to Visit  Medication Dose Route Frequency Provider Last Rate Last Admin   sodium chloride  flush (NS) 0.9 % injection 10 mL  10 mL Intracatheter PRN Brian [name] Maude SAUNDERS, MD   10 mL at 03/19/23 1338   "

## 2024-12-08 NOTE — Progress Notes (Unsigned)
 [facility] [address], [address] [place], [place] [address] [phone]  Date:  12/09/2024   Name:  Brian [name]   DOB:  Sep 23, 1961   MRN:  [id number]  PCP:  Watt Harlene BROCKS, MD    Chief Complaint: No chief complaint on file.   History of Present Illness:  Brian [name] is a 64 y.o. very pleasant male patient who presents with the following:  Patient seen today for follow-up visit.  He recently reach out to me with concern about anxiety and I asked him to please schedule an appointment Our most recent visit together was in June Virtual visit today, patient location is home and my location is office.  Patient identity confirmed with 2 factors, he gives consent for virtual visit today The patient and myself are present on the call today  He has history of Hodgkin's disease unfortunately status post multiple recurrences, Parkinson's disease, hypertension, prediabetes, history of DVT, chronic back pain From a Hodgkin's disease standpoint he had a stem cell transplant in 2014, then had a recurrence.  He had CAR-T therapy at [facility] in 2017 and did well until another relapse in his spine, subsequent radiation treatment.  He had another recurrence in 2024 and did 6 rounds of chemotherapy, another recurrence noted on PET scan in December 2025  Most recent hematology visit 12/31 with Dr. Timmy Interim History:  Brian [name] is in for follow-up.  Unfortunately, it looks like he has another recurrence.  He had a PET scan that was done on 11/05/2024.  It showed that he had new hypermetabolic mediastinal lymph nodes.  These were with a SUV of 6.4.  Outside of that, everything else looked okay. He is being seen by Pulmonary Medicine for a bronchoscopy to try to get these biopsied to confirm that this is metastatic disease. He otherwise is managing his Parkinson's pretty well.  He has been seen by Dr. Evonnie of Neurology./////////////////// He  is now relapsed.  I suspect he probably has relapse.  We will see what the biopsy shows. As far as further therapy, I know that we are running out of options.  I think the fact that he has not had chemotherapy with nivolumab  for almost 2 years is certainly encouraging.  I suppose we could might be able to go back to that protocol. We also could try him on gemcitabine/Navelbine/Doxil .  This I think would be reasonable. Again, I will have to see what the biopsy shows.  Pulmonology plans to do a bronchoscopy and biopsy tomorrow  He was seen by Dr. Evonnie with neurology on December 16: 1.  Parkinsons Disease             -Continue carbidopa /levodopa  25/100 and take 2 tablets at 7 AM, 2 tablets at 11 AM, 2 tablet at 3 PM, 2 tablet at 7 PM.  He's taking an extra pill 3-4 days/week             -Continue carbidopa /levodopa  50/200 at bedtime.             -started inbrija .  Shown how to use.  Samples given.  Call me and let me know how he is doing.               -discussed rytary /crexont  but worry he won't be happy with it.  It would offer potentially more on time with less dyskinesia but I find that pts who are  used to IR only don't feel that they move as well             -Discussed stopping entacapone  due to hallucinations.  Will wean by decreasing a pill per week and then e/c.   If that does not help, we will likely have to wean off of pramipexole  will             -Continue pramipexole  0.5 mg 3 times per day.  Higher dosages caused hallucinations.             -will consider inbrija  in the future             - Discussed several times this visit but I thought he needed to use his walker at all times.             -Patient sought second opinion at [facility] with Dr. Hans in May, 2023.  There was no significant changes in medication.             -he asks several times today about going to see Dr. Pablo at [facility].  Discussed that I don't have objection but think that the suggestions are going to be some of the  same ones that we have given but he has opted not to take at this point.             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  Patient has synkinetic reinnervation of the right face.  He currently just has a prescription for lorazepam  0.5 every 6 hours as needed  Discussed the use of AI scribe software for clinical note transcription with the patient, who gave verbal consent to proceed.  History of Present Illness        Patient Active Problem List   Diagnosis Date Noted   Prediabetes 09/17/2023   Right shoulder pain 06/18/2023   Acute deep vein thrombosis (DVT) of proximal vein of right lower extremity (HCC) 06/18/2023   Malabsorption of iron  03/01/2023   Primary hypertension 09/12/2022   Closed fracture of right proximal humerus 10/03/2021   Traumatic complete tear of right rotator cuff 10/03/2021   Lumbar radiculopathy 10/03/2021   Goals of care, counseling/discussion 09/25/2018   Mass of right chest wall 09/18/2018   PD (Parkinson's disease) (HCC) 11/27/2016   Lymphadenopathy 07/28/2015   Mediastinal adenopathy 07/14/2015   Aphasia 03/04/2015   Slurred speech 03/04/2015   Pleural mass 05/31/2014   Abdominal pain, epigastric 02/02/2014   Weight loss 08/12/2012   H/O Bell's palsy 08/12/2012   Hodgkin's disease 08/09/2011    Past Medical History:  Diagnosis Date   Anxiety    Dysrhythmia    past hx pvc   Goals of care, counseling/discussion 09/25/2018   H/O autologous stem cell transplant (HCC)    may 2014  at [facility]   History of Bell's palsy    2009  RIGHT SIDE--  HAS 80% FUNCTION / PT STATES A LITTLE ASYMETRICAL AND EFFECTS MOUTH   History of radiation therapy 10/18/17-11/05/17   sprine T1 26 Gy in 13 fractions, spine boost 10 Gy in 5 fractions   History of radiation therapy    Right hip, Rt Sclav- 07/23/22-08/07/11- Dr. Lynwood Nasuti   Hodgkin's disease, nodular sclerosis, of inguinal region/lower  limb (HCC) ONOLOGIST--  DR TIMMY AND [facility]     SALVAGE CHEMO 2013/  AUTOLOGUS STEM CELL  TRANSPLANT MAY 2014 AT [facility]   Hypertension    Malabsorption of iron  03/01/2023   Mass of right chest wall 09/18/2018   Mass of right inguinal region    Neuromuscular disorder (HCC)    bilateral feet neuropathy   PVC (premature ventricular contraction)    benign   TIA (transient ischemic attack) 02/2015   probable TIA   Wears contact lenses     Past Surgical History:  Procedure Laterality Date   AXILLARY LYMPH NODE BIOPSY Left 02/02/2013   Procedure: NEEDLE LOCALIZED AXILLARY LYMPH NODE BIOPSY;  Surgeon: Sherlean JINNY Laughter, MD;  Location: [facility];  Service: General;  Laterality: Left;   BACK SURGERY  03/2021   BONE MARROW BIOPSY  08/26/2012   COLONOSCOPY  2021   CYST REMOVAL NECK Left 11/26/1978   IR IMAGING GUIDED PORT INSERTION  10/23/2022   IR REMOVAL TUN ACCESS W/ PORT W/O FL MOD SED  04/09/2024   LEFT INGUINAL LYMPH NODE BX  09/09/2012   LYMPH NODE BIOPSY N/A 07/28/2015   Procedure: LYMPH NODE BIOPSY;  Surgeon: Elspeth JAYSON Millers, MD;  Location: [facility] OR;  Service: Thoracic;  Laterality: N/A;   LYMPH NODE BIOPSY Right 06/29/2022   Procedure: RIGHT AXILLARY NODE BIOPSY;  Surgeon: Millers Elspeth JAYSON, MD;  Location: [facility] OR;  Service: Vascular;  Laterality: Right;   MASS EXCISION Right 09/19/2018   Procedure: EXCISION OF RIGHT CHEST WALL MASS ERAS PATHWAY;  Surgeon: Gail Favorite, MD;  Location: [facility];  Service: General;  Laterality: Right;   NODE DISSECTION Right 07/28/2015   Procedure: NODE DISSECTION;  Surgeon: Elspeth JAYSON Millers, MD;  Location: [facility] OR;  Service: Thoracic;  Laterality: Right;   PLEURA BIOPSY Left 05/31/2014   REMOVAL RIGHT INGUINAL LYMPH NODES  08/16/2011   SCROTAL EXPLORATION Right 01/04/2014   Procedure: SCROTUM EXPLORATION   INGUINAL , EXCISION OF CYSTIC MASS OF RIGHT SPERMATIC CORD, WITH FROZEN SECTION;  Surgeon: Garnette Shack, MD;  Location: [facility];  Service: Urology;  Laterality: Right;   TRANSTHORACIC ECHOCARDIOGRAM  03/18/2013   MILD LVH/  EF 55-60%   VIDEO ASSISTED THORACOSCOPY Left 05/31/2014   Procedure: LEFT VIDEO ASSISTED THORACOSCOPY, PLEURAL BIOPSY;  Surgeon: Elspeth JAYSON Millers, MD;  Location: [facility] OR;  Service: Thoracic;  Laterality: Left;   VIDEO ASSISTED THORACOSCOPY Right 07/28/2015   Procedure: RIGHT VIDEO ASSISTED THORACOSCOPY;  Surgeon: Elspeth JAYSON Millers, MD;  Location: [facility] OR;  Service: Thoracic;  Laterality: Right;   VIDEO ASSISTED THORACOSCOPY (VATS)/ LYMPH NODE SAMPLING Right 07/28/2015   VIDEO ASSISTED THORACOSCOPY (VATS)/WEDGE RESECTION  05/31/2014   VIDEO BRONCHOSCOPY WITH ENDOBRONCHIAL ULTRASOUND  07/28/2015   VIDEO BRONCHOSCOPY WITH ENDOBRONCHIAL ULTRASOUND N/A 07/28/2015   Procedure: VIDEO BRONCHOSCOPY WITH ENDOBRONCHIAL ULTRASOUND;  Surgeon: Elspeth JAYSON Millers, MD;  Location: [facility] OR;  Service: Thoracic;  Laterality: N/A;    Social History[1]  Family History  Problem Relation Age of Onset   Cancer Mother 54       ovarian   Ovarian cancer Mother 61   Heart disease Father    Stroke Father    Cancer Brother        testicular   Hypertension Brother    Healthy Son    Healthy Son    Hypertension Brother    Other Brother        CMT   Hypertension Brother     Allergies[2]  Medication list has been reviewed and updated.  Medications Ordered Prior to Encounter[3]  Review of  Systems:  As per HPI- otherwise negative.   Physical Examination: There were no vitals filed for this visit. There were no vitals filed for this visit. There is no height or weight on file to calculate BMI. Ideal Body Weight:    GEN: no acute distress. HEENT: Atraumatic, Normocephalic.  Ears and Nose: No external deformity. CV: RRR, No M/G/R. No JVD. No thrill. No extra heart sounds. PULM: CTA B, no wheezes, crackles, rhonchi. No retractions. No resp. distress. No  accessory muscle use. ABD: S, NT, ND, +BS. No rebound. No HSM. EXTR: No c/c/e PSYCH: Normally interactive. Conversant.    Assessment and Plan: No diagnosis found.  Assessment & Plan   Signed Harlene Schroeder, MD     [1]  Social History Tobacco Use   Smoking status: Never   Smokeless tobacco: Never  Vaping Use   Vaping status: Never Used  Substance Use Topics   Alcohol use: Yes    Alcohol/week: 14.0 standard drinks of alcohol    Types: 7 Glasses of wine, 7 Cans of beer per week    Comment: one drink per night   Drug use: No  [2] No Known Allergies [3]  Current Outpatient Medications on File Prior to Visit  Medication Sig Dispense Refill   ascorbic acid (VITAMIN C) 500 MG tablet Take 500 mg by mouth daily.     carbidopa -levodopa  (SINEMET  CR) 50-200 MG tablet TAKE 1 TABLET BY MOUTH AT BEDTIME 90 tablet 0   carbidopa -levodopa  (SINEMET  IR) 25-100 MG tablet TAKE 2 TABLETS BY MOUTH AT 7AM, 2 TABLETS AT 11AM, 2 TABLETS AT 3PM AND 2 TABLETS AT 7PM 720 tablet 0   cholecalciferol  (VITAMIN D3) 25 MCG (1000 UNIT) tablet Take 1,000 Units by mouth daily.     diphenhydramine -acetaminophen  (TYLENOL  PM) 25-500 MG TABS tablet Take 1 tablet by mouth at bedtime as needed.     LORazepam  (ATIVAN ) 0.5 MG tablet TAKE 1 TABLET(0.5 MG) BY MOUTH EVERY 6 HOURS AS NEEDED FOR ANXIETY 20 tablet 1   pramipexole  (MIRAPEX ) 0.5 MG tablet TAKE 1 TABLET(0.5 MG) BY MOUTH THREE TIMES DAILY 270 tablet 0   tadalafil  (CIALIS ) 5 MG tablet Take 1 tablet (5 mg total) by mouth daily. 30 tablet 11   Current Facility-Administered Medications on File Prior to Visit  Medication Dose Route Frequency Provider Last Rate Last Admin   sodium chloride  flush (NS) 0.9 % injection 10 mL  10 mL Intracatheter PRN Timmy Maude SAUNDERS, MD   10 mL at 03/19/23 1338   "

## 2024-12-08 NOTE — Telephone Encounter (Unsigned)
 Copied from CRM [id number]. Topic: General - Other >> Dec 04, 2024  2:40 PM Brian [name] wrote: Reason for CRM: patient returning call from [facility] regarding Bronchoscopy - please call back on Monday at earliest convenience

## 2024-12-09 ENCOUNTER — Encounter: Payer: Self-pay | Admitting: Family Medicine

## 2024-12-09 ENCOUNTER — Telehealth: Admitting: Family Medicine

## 2024-12-09 ENCOUNTER — Ambulatory Visit: Admitting: Family Medicine

## 2024-12-09 VITALS — Ht 70.0 in

## 2024-12-09 DIAGNOSIS — F419 Anxiety disorder, unspecified: Secondary | ICD-10-CM | POA: Diagnosis not present

## 2024-12-09 DIAGNOSIS — C8195 Hodgkin lymphoma, unspecified, lymph nodes of inguinal region and lower limb: Secondary | ICD-10-CM

## 2024-12-09 DIAGNOSIS — G20B1 Parkinson's disease with dyskinesia, without mention of fluctuations: Secondary | ICD-10-CM | POA: Diagnosis not present

## 2024-12-09 MED ORDER — LORAZEPAM 0.5 MG PO TABS: ORAL_TABLET | ORAL | 3 refills | Status: AC

## 2024-12-09 NOTE — Progress Notes (Signed)
 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  History includes never smoker, HTN, Hodgkin's lymphoma (diagnosed ~ 08/2012, s/p chemotherapy; s/p autologous stem cell transplant 04/23/2013; has had multiple recurrences, s/p left VATS, resection left pleural mass 05/31/2014; immunotherapy 2017; ransplant; [facility] 2017, radiation to spine 2018 and to right axilla 2023; s/p chemotherapy 2024, recurrence suggested on 10/2024 PET scan), Parkinson's disease, DVT (RLE DVT 05/28/2023), PVCs, TIA, Bell's palsy, peripheral neuropathy, IDA/iron  malabsorption, chronic back pain. Right internal jugular Port removal 04/09/2024.  He is on carbidopa  levodopa  for Parkinson's.  He is also on Mirapex  3 times daily.  Evaluated by primary care on 12/09/2024 for anxiety and prescribed lorazepam  as needed.  Anesthesia team to evaluate on the day of surgery.  Updated labs and EKG as indicated.   VS: Ht 5' 10 (1.778 m)   Wt 73.5 kg   BMI 23.25 kg/m  BP Readings from Last 3 Encounters:  12/03/24 (!) 152/92  11/25/24 99/73  11/25/24 (!) 143/89   Pulse Readings from Last 3 Encounters:  12/03/24 90  11/25/24 89  11/25/24 87    PROVIDERS: Copland, Harlene BROCKS, MD is PCP  Tat, Asberry, DO is neurologist [name], MD is HEM-ONC   LABS: Most recent lab results in [facility] include: Lab Results  Component Value Date   WBC 4.1 11/25/2024   HGB 13.2 11/25/2024   HCT 40.2 11/25/2024   PLT 194 11/25/2024   GLUCOSE 107 (H) 11/25/2024   CHOL 198 09/16/2023   TRIG 173.0 (H) 09/16/2023   HDL 40.70 09/16/2023   LDLCALC 123 (H) 09/16/2023   ALT  6 11/25/2024   AST 26 11/25/2024   NA 140 11/25/2024   K 4.7 11/25/2024   CL 105 11/25/2024   CREATININE 1.15 11/25/2024   BUN 36 (H) 11/25/2024   CO2 24 11/25/2024   TSH 5.99 (H) 04/29/2024   PSA 2.07 09/16/2023   INR 1.1 06/27/2022   HGBA1C 6.2 09/16/2023    IMAGES: PET Scan 11/09/2024: IMPRESSION: New hypermetabolic mediastinal lymph nodes, compatible with recurrent lymphoma ([place] 5).   EKG: Last EKG noted 06/27/2022: Sinus rhythm with short PR Otherwise normal ECG When compared with ECG of 26-Jul-2015 10:39, No significant change was found Confirmed by Perla Lye [phone]) on 06/27/2022 11:56:42 AM  CV: BLE Venous US  08/12/2024: IMPRESSION: Negative for bilateral lower extremity DVT.  Echo (Limited) 04/19/2023: IMPRESSIONS   1. Left ventricular ejection fraction, by estimation, is 55 to 60%. The  left ventricle has normal function. The left ventricle has no regional  wall motion abnormalities. The average left ventricular global  longitudinal strain is -17.5 %. The global  longitudinal strain is normal.   2. Right ventricular systolic function is normal. The right ventricular  size is not well visualized. There is normal pulmonary artery systolic  pressure.   3. The mitral valve is normal in structure.   4. The inferior vena cava is dilated in size with >50% respiratory  variability, suggesting right atrial pressure of 8 mmHg.  - Comparison(s): No significant change from prior study. Visually appears unchanged from prior.    Echo (Complete) 10/22/2022:  IMPRESSIONS   1. Left ventricular ejection fraction, by estimation, is 50 to 55%. The  left ventricle has low normal function. The left ventricle has no regional  wall motion abnormalities. Left ventricular diastolic parameters were  normal.   2. Right ventricular systolic function is normal. The right ventricular  size is normal. Tricuspid regurgitation signal is inadequate for assessing  PA pressure.   3.  The mitral valve is normal in structure. Trivial mitral valve  regurgitation. No evidence of mitral stenosis.   4. The aortic valve was not well visualized. Aortic valve regurgitation  is not visualized. No aortic stenosis is present.   5. The inferior vena cava is normal in size with greater than 50%  respiratory variability, suggesting right atrial pressure of 3 mmHg.   Past Medical History:  Diagnosis Date   Anxiety    Dysrhythmia    past hx pvc   Goals of care, counseling/discussion 09/25/2018   H/O autologous stem cell transplant (HCC)    may 2014  at [facility]   History of Bell's palsy    2009  RIGHT SIDE--  HAS 80% FUNCTION / PT STATES A LITTLE ASYMETRICAL AND EFFECTS MOUTH   History of radiation therapy 10/18/17-11/05/17   sprine T1 26 Gy in 13 fractions, spine boost 10 Gy in 5 fractions   History of radiation therapy    Right hip, Rt Sclav- 07/23/22-08/07/11- Dr. 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   Mass of right chest wall 09/18/2018   Mass of right inguinal region    Neuromuscular disorder (HCC)    bilateral feet neuropathy   PVC (premature ventricular contraction)    benign   TIA (transient ischemic attack) 02/2015   probable TIA   Wears contact lenses     Past Surgical History:  Procedure Laterality Date   AXILLARY LYMPH NODE BIOPSY Left 02/02/2013   Procedure: NEEDLE LOCALIZED AXILLARY LYMPH NODE BIOPSY;  Surgeon: Sherlean JINNY Laughter, MD;  Location: [facility];  Service: General;  Laterality: Left;   BACK SURGERY  03/2021   BONE MARROW BIOPSY  08/26/2012   COLONOSCOPY  2021   CYST REMOVAL NECK Left 11/26/1978   IR IMAGING GUIDED PORT INSERTION  10/23/2022   IR REMOVAL TUN ACCESS W/ PORT W/O FL MOD SED  04/09/2024   LEFT INGUINAL LYMPH  NODE BX  09/09/2012   LYMPH NODE BIOPSY N/A 07/28/2015   Procedure: LYMPH NODE BIOPSY;  Surgeon: Elspeth JAYSON Millers, MD;  [facility];  Service: Thoracic;  Laterality: N/A;   LYMPH NODE BIOPSY Right 06/29/2022   Procedure: RIGHT AXILLARY NODE BIOPSY;  Surgeon: Millers Elspeth JAYSON, MD;  Location: [facility] OR;  Service: Vascular;  Laterality: Right;   MASS EXCISION Right 09/19/2018   Procedure: EXCISION OF RIGHT CHEST WALL MASS ERAS PATHWAY;  Surgeon: Gail Favorite, MD;  Location: [facility];  Service: General;  Laterality: Right;   NODE DISSECTION Right 07/28/2015   Procedure: NODE DISSECTION;  Surgeon: Elspeth JAYSON Millers, MD;  Location: [facility] OR;  Service: Thoracic;  Laterality: Right;   PLEURA BIOPSY Left 05/31/2014   REMOVAL RIGHT INGUINAL LYMPH NODES  08/16/2011   SCROTAL EXPLORATION Right 01/04/2014   Procedure: SCROTUM EXPLORATION   INGUINAL , EXCISION OF CYSTIC MASS OF RIGHT  SPERMATIC CORD, WITH FROZEN SECTION;  Surgeon: Garnette Shack, MD;  Location: [facility];  Service: Urology;  Laterality: Right;   TRANSTHORACIC ECHOCARDIOGRAM  03/18/2013   MILD LVH/  EF 55-60%   VIDEO ASSISTED THORACOSCOPY Left 05/31/2014   Procedure: LEFT VIDEO ASSISTED THORACOSCOPY, PLEURAL BIOPSY;  Surgeon: Elspeth JAYSON Millers, MD;  [facility];  Service: Thoracic;  Laterality: Left;   VIDEO ASSISTED THORACOSCOPY Right 07/28/2015   Procedure: RIGHT VIDEO ASSISTED THORACOSCOPY;  Surgeon: Elspeth JAYSON Millers, MD;  [facility];  Service: Thoracic;  Laterality: Right;   VIDEO ASSISTED THORACOSCOPY (VATS)/ LYMPH NODE SAMPLING Right 07/28/2015   VIDEO ASSISTED THORACOSCOPY (VATS)/WEDGE RESECTION  05/31/2014   VIDEO BRONCHOSCOPY WITH ENDOBRONCHIAL ULTRASOUND  07/28/2015   VIDEO BRONCHOSCOPY WITH ENDOBRONCHIAL ULTRASOUND N/A 07/28/2015   Procedure: VIDEO BRONCHOSCOPY WITH ENDOBRONCHIAL ULTRASOUND;  Surgeon: Elspeth JAYSON Millers, MD;  [facility];  Service: Thoracic;   Laterality: N/A;    MEDICATIONS:  ascorbic acid (VITAMIN C) 500 MG tablet   carbidopa -levodopa  (SINEMET  CR) 50-200 MG tablet   carbidopa -levodopa  (SINEMET  IR) 25-100 MG tablet   cholecalciferol  (VITAMIN D3) 25 MCG (1000 UNIT) tablet   diphenhydramine -acetaminophen  (TYLENOL  PM) 25-500 MG TABS tablet   LORazepam  (ATIVAN ) 0.5 MG tablet   pramipexole  (MIRAPEX ) 0.5 MG tablet   tadalafil  (CIALIS ) 5 MG tablet    sodium chloride  flush (NS) 0.9 % injection 10 mL    Isaiah Ruder, PA-C Surgical Short Stay/Anesthesiology [facility] Phone [phone] [facility] Phone [phone] 12/09/2024 1:45 PM

## 2024-12-09 NOTE — Anesthesia Preprocedure Evaluation (Addendum)
 "                                  Anesthesia Evaluation  Patient identified by MRN, date of birth, ID band Patient awake    Reviewed: Allergy & Precautions, NPO status , Patient's Chart, lab work & pertinent test results  History of Anesthesia Complications Negative for: history of anesthetic complications  Airway Mallampati: III  TM Distance: >3 FB Neck ROM: Full    Dental  (+) Dental Advisory Given   Pulmonary neg pulmonary ROS, neg sleep apnea, neg COPD, Patient abstained from smoking.Not current smoker   breath sounds clear to auscultation       Cardiovascular Exercise Tolerance: Good METShypertension, Pt. on medications (-) angina (-) CAD and (-) Past MI (-) dysrhythmias  Rhythm:Regular Rate:Normal  '16 ECHO: Abnormal septal motion Hard to judge EF due to frequent ventricular ectopy and poor image quality. The cavity size was mildly dilated. Wall thickness was normal. EF 40%, no significant valvular abnormalities    Neuro/Psych  PSYCHIATRIC DISORDERS Anxiety     Parkinson's H/o Bell's palsy TIA Neuromuscular disease    GI/Hepatic negative GI ROS, Neg liver ROS,neg GERD  ,,  Endo/Other  negative endocrine ROSneg diabetes    Renal/GU negative Renal ROS     Musculoskeletal   Abdominal   Peds  Hematology H/o Hodgkin's: s/p chemo and stem cell transplant, XRT   Anesthesia Other Findings Past Medical History: No date: Anxiety No date: Dysrhythmia     Comment:  past hx pvc 09/25/2018: Goals of care, counseling/discussion No date: H/O autologous stem cell transplant (HCC)     Comment:  may 2014  at [facility] No date: History of Bell's palsy     Comment:  2009  RIGHT SIDE--  HAS 80% FUNCTION / PT STATES A               LITTLE ASYMETRICAL AND EFFECTS MOUTH 10/18/17-11/05/17: History of radiation therapy     Comment:  sprine T1 26 Gy in 13 fractions, spine boost 10 Gy in 5               fractions No date: History of radiation therapy     Comment:  Right  hip, Rt Sclav- 07/23/22-08/07/11- Dr. Lynwood Nasuti ONOLOGIST--  [facility]: Hodgkin's disease, nodular  sclerosis, of inguinal region/lower limb (HCC)     Comment:    SALVAGE CHEMO 2013/  AUTOLOGUS STEM CELL TRANSPLANT               MAY 2014 AT [facility] No date: Hypertension 03/01/2023: Malabsorption of iron  09/18/2018: Mass of right chest wall No date: Mass of right inguinal region No date: Neuromuscular disorder (HCC)     Comment:  bilateral feet neuropathy No date: Parkinson disease (HCC) No date: PVC (premature ventricular contraction)     Comment:  benign 02/2015: TIA (transient ischemic attack)     Comment:  probable TIA No date: Wears contact lenses  Reproductive/Obstetrics                              Anesthesia Physical Anesthesia Plan  ASA: 3  Anesthesia Plan: General   Post-op Pain Management: Minimal or no pain anticipated   Induction: Intravenous  PONV Risk Score and Plan: 2 and Ondansetron , Dexamethasone , TIVA and Propofol  infusion  Airway Management Planned: Oral ETT  Additional Equipment: None  Intra-op Plan:   Post-operative Plan: Extubation in OR  Informed Consent: I have reviewed the patients History and Physical, chart, labs and discussed the procedure including the risks, benefits and alternatives for the proposed anesthesia with the patient or authorized representative who has indicated his/her understanding and acceptance.     Dental advisory given  Plan Discussed with: CRNA and Surgeon  Anesthesia Plan Comments: (Discussed risks of anesthesia with patient, including PONV, sore throat, lip/dental/eye damage. Rare risks discussed as well, such as cardiorespiratory and neurological sequelae, and allergic reactions. Discussed the role of CRNA in patient's perioperative care. Patient understands.)         Anesthesia Quick Evaluation  "

## 2024-12-10 ENCOUNTER — Encounter (HOSPITAL_COMMUNITY): Payer: Self-pay

## 2024-12-10 ENCOUNTER — Ambulatory Visit (HOSPITAL_COMMUNITY)

## 2024-12-10 ENCOUNTER — Ambulatory Visit (HOSPITAL_COMMUNITY): Admitting: Vascular Surgery

## 2024-12-10 ENCOUNTER — Ambulatory Visit (HOSPITAL_COMMUNITY): Admission: RE | Admit: 2024-12-10 | Discharge: 2024-12-10 | Disposition: A | Discharge status: Home / Self Care

## 2024-12-10 ENCOUNTER — Other Ambulatory Visit: Payer: Self-pay

## 2024-12-10 ENCOUNTER — Encounter (HOSPITAL_COMMUNITY): Admission: RE | Disposition: A | Payer: Self-pay | Source: Home / Self Care

## 2024-12-10 DIAGNOSIS — Z7982 Long term (current) use of aspirin: Secondary | ICD-10-CM | POA: Insufficient documentation

## 2024-12-10 DIAGNOSIS — Z7901 Long term (current) use of anticoagulants: Secondary | ICD-10-CM | POA: Insufficient documentation

## 2024-12-10 DIAGNOSIS — Z79899 Other long term (current) drug therapy: Secondary | ICD-10-CM | POA: Diagnosis not present

## 2024-12-10 DIAGNOSIS — Z8673 Personal history of transient ischemic attack (TIA), and cerebral infarction without residual deficits: Secondary | ICD-10-CM | POA: Insufficient documentation

## 2024-12-10 DIAGNOSIS — I10 Essential (primary) hypertension: Secondary | ICD-10-CM | POA: Diagnosis not present

## 2024-12-10 DIAGNOSIS — R590 Localized enlarged lymph nodes: Secondary | ICD-10-CM | POA: Diagnosis present

## 2024-12-10 DIAGNOSIS — G20A1 Parkinson's disease without dyskinesia, without mention of fluctuations: Secondary | ICD-10-CM | POA: Insufficient documentation

## 2024-12-10 DIAGNOSIS — C8190 Hodgkin lymphoma, unspecified, unspecified site: Secondary | ICD-10-CM | POA: Insufficient documentation

## 2024-12-10 DIAGNOSIS — F419 Anxiety disorder, unspecified: Secondary | ICD-10-CM | POA: Insufficient documentation

## 2024-12-10 DIAGNOSIS — Z9484 Stem cells transplant status: Secondary | ICD-10-CM | POA: Insufficient documentation

## 2024-12-10 HISTORY — PX: VIDEO BRONCHOSCOPY WITH ENDOBRONCHIAL ULTRASOUND: SHX6177

## 2024-12-10 HISTORY — PX: BRONCHIAL NEEDLE ASPIRATION BIOPSY: SHX5106

## 2024-12-10 HISTORY — DX: Parkinson's disease without dyskinesia, without mention of fluctuations: G20.A1

## 2024-12-10 MED ORDER — DEXAMETHASONE SOD PHOSPHATE PF 10 MG/ML IJ SOLN
INTRAMUSCULAR | Status: DC | PRN
  Administered 2024-12-10: 10 mg via INTRAVENOUS

## 2024-12-10 MED ORDER — LIDOCAINE 2% (20 MG/ML) 5 ML SYRINGE
INTRAMUSCULAR | Status: DC | PRN
  Administered 2024-12-10: 80 mg via INTRAVENOUS

## 2024-12-10 MED ORDER — OXYCODONE HCL 5 MG/5ML PO SOLN
5.0000 mg | Freq: Once | ORAL | Status: DC | PRN
  Filled 2024-12-10: qty 5

## 2024-12-10 MED ORDER — FENTANYL CITRATE (PF) 100 MCG/2ML IJ SOLN: 25.0000 ug | INTRAMUSCULAR | Status: DC | PRN

## 2024-12-10 MED ORDER — FENTANYL CITRATE (PF) 250 MCG/5ML IJ SOLN
INTRAMUSCULAR | Status: DC | PRN
  Administered 2024-12-10 (×2): 50 ug via INTRAVENOUS

## 2024-12-10 MED ORDER — PHENYLEPHRINE HCL-NACL 20-0.9 MG/250ML-% IV SOLN
INTRAVENOUS | Status: DC | PRN
  Administered 2024-12-10: 160 ug via INTRAVENOUS
  Administered 2024-12-10: 40 ug/min via INTRAVENOUS

## 2024-12-10 MED ORDER — FENTANYL CITRATE (PF) 100 MCG/2ML IJ SOLN
INTRAMUSCULAR | Status: AC
  Filled 2024-12-10: qty 2

## 2024-12-10 MED ORDER — OXYCODONE HCL 5 MG PO TABS
5.0000 mg | ORAL_TABLET | Freq: Once | ORAL | Status: DC | PRN
  Filled 2024-12-10: qty 1

## 2024-12-10 MED ORDER — ONDANSETRON HCL 4 MG/2ML IJ SOLN
INTRAMUSCULAR | Status: DC | PRN
  Administered 2024-12-10: 4 mg via INTRAVENOUS

## 2024-12-10 MED ORDER — CHLORHEXIDINE GLUCONATE 0.12 % MT SOLN: 15.0000 mL | Freq: Once | OROMUCOSAL | Status: DC

## 2024-12-10 MED ORDER — PROPOFOL 10 MG/ML IV BOLUS
INTRAVENOUS | Status: DC | PRN
  Administered 2024-12-10: 170 ug/kg/min via INTRAVENOUS
  Administered 2024-12-10: 30 mg via INTRAVENOUS
  Administered 2024-12-10: 120 mg via INTRAVENOUS

## 2024-12-10 MED ORDER — DROPERIDOL 2.5 MG/ML IJ SOLN: 0.6250 mg | Freq: Once | INTRAMUSCULAR | Status: DC | PRN

## 2024-12-10 MED ORDER — ACETAMINOPHEN 10 MG/ML IV SOLN
1000.0000 mg | Freq: Once | INTRAVENOUS | Status: DC | PRN
  Filled 2024-12-10: qty 100

## 2024-12-10 MED ORDER — ROCURONIUM BROMIDE 10 MG/ML (PF) SYRINGE
PREFILLED_SYRINGE | INTRAVENOUS | Status: DC | PRN
  Administered 2024-12-10: 20 mg via INTRAVENOUS
  Administered 2024-12-10: 50 mg via INTRAVENOUS

## 2024-12-10 MED ORDER — LACTATED RINGERS IV SOLN: INTRAVENOUS | Status: DC

## 2024-12-10 MED ORDER — SUGAMMADEX SODIUM 200 MG/2ML IV SOLN
INTRAVENOUS | Status: DC | PRN
  Administered 2024-12-10: 200 mg via INTRAVENOUS

## 2024-12-10 NOTE — Transfer of Care (Signed)
 Immediate Anesthesia Transfer of Care Note  Patient: Brian [name]  Procedure(s) Performed: BRONCHOSCOPY, WITH EBUS (Bilateral)  Patient Location: PACU  Anesthesia Type:General  Level of Consciousness: drowsy and responds to stimulation  Airway & Oxygen Therapy: Patient Spontanous Breathing and Patient connected to face mask oxygen  Post-op Assessment: Report given to RN and Post -op Vital signs reviewed and stable  Post vital signs: Reviewed and stable  Last Vitals:  Vitals Value Taken Time  BP 106/58 12/10/24 11:40  Temp    Pulse 84 12/10/24 11:45  Resp 21 12/10/24 11:45  SpO2 93 % 12/10/24 11:45  Vitals shown include unfiled device data.  Last Pain:  Vitals:   12/10/24 0709  TempSrc:   PainSc: 7          Complications: No notable events documented.

## 2024-12-10 NOTE — Op Note (Signed)
 Flexible and EBUS Bronchoscopy Procedure Note  KARION CUDD  [id number]  [birth year]  Date:12/10/24  Time:11:19 AM   Provider Performing:Justinian Miano N Ymani Porcher   Procedure: Flexible bronchoscopy and EBUS Bronchoscopy  Indication(s) Mediastinal LAD  Consent Risks of the procedure as well as the alternatives and risks of each were explained to the patient and/or caregiver.  Consent for the procedure was obtained.  Anesthesia General Anesthesia   Time Out Verified patient identification, verified procedure, site/side was marked, verified correct patient position, special equipment/implants available, medications/allergies/relevant history reviewed, required imaging and test results available.   Sterile Technique Usual hand hygiene, masks, gowns, and gloves were used   Procedure Description Diagnostic bronchoscope advanced through endotracheal tube and into airway.  Airways were examined down to subsegmental level with findings noted below.  Following diagnostic evaluation, Normal airway anatomy and mucosa. Noted white thick secretions in the right main stem bronchus that were suctioned and cleared.  The diagnostic bronchoscope was then removed and the EBUS bronchoscope was advanced into airway with stations 10L and 4R biopsied and sent for slide, cell block, and/or culture.  The EBUS bronchoscope was removed after assuring no active bleeding from biopsy site.  Findings: Normal airway anatomy and mucosa. Scant white thick secretions noted in the right mainstem bronchus that were suctioned and cleared. Evaluation of stations 11R,7,4R, 10L and 11L, only station 4R appeared enlarged and abnormal.    Complications/Tolerance None; patient tolerated the procedure well. Chest X-ray is needed post procedure.   EBL Minimal   Specimen(s) TBNA from stations 10L and 4R

## 2024-12-10 NOTE — Interval H&P Note (Signed)
 History and Physical Interval Note:  12/10/2024 9:50 AM  Brian [name]  has presented today for surgery, with the diagnosis of Mediastinal LAD.  The various methods of treatment have been discussed with the patient and family. After consideration of risks, benefits and other options for treatment, the patient has consented to  Procedures: BRONCHOSCOPY, WITH EBUS (Bilateral) as a surgical intervention.  The patient's history has been reviewed, patient examined, no change in status, stable for surgery.  I have reviewed the patient's chart and labs.  Questions were answered to the patient's satisfaction.     Brian [name]

## 2024-12-10 NOTE — Anesthesia Procedure Notes (Signed)
 Procedure Name: Intubation Date/Time: 12/10/2024 10:29 AM  Performed by: Roslynn Waddell LABOR, CRNAPre-anesthesia Checklist: Patient identified, Emergency Drugs available, Suction available and Patient being monitored Patient Re-evaluated:Patient Re-evaluated prior to induction Oxygen Delivery Method: Circle System Utilized Preoxygenation: Pre-oxygenation with 100% oxygen Induction Type: IV induction Ventilation: Mask ventilation without difficulty Laryngoscope Size: Glidescope and 3 Grade View: Grade I Tube type: Oral Tube size: 8.5 mm Number of attempts: 1 Airway Equipment and Method: Stylet and Oral airway Placement Confirmation: ETT inserted through vocal cords under direct vision, positive ETCO2 and breath sounds checked- equal and bilateral Secured at: 23 cm Tube secured with: Tape Dental Injury: Teeth and Oropharynx as per pre-operative assessment  Comments: Atraumatic induction/intubation. VL with GS 3 blade but unable to pass ETT as cords were not relaxed. Pulled out blade and ETT and mask ventilated patient. 2nd VL attempts and cords were opened. Able to gently pass ETT through glottis

## 2024-12-10 NOTE — Anesthesia Postprocedure Evaluation (Signed)
"   Anesthesia Post Note  Patient: Brian [name]  Procedure(s) Performed: BRONCHOSCOPY, WITH EBUS (Bilateral) BRONCHOSCOPY, WITH NEEDLE ASPIRATION BIOPSY     Patient location during evaluation: PACU Anesthesia Type: General Level of consciousness: awake and alert Pain management: pain level controlled Vital Signs Assessment: post-procedure vital signs reviewed and stable Respiratory status: spontaneous breathing, nonlabored ventilation, respiratory function stable and patient connected to nasal cannula oxygen Cardiovascular status: blood pressure returned to baseline and stable Postop Assessment: no apparent nausea or vomiting Anesthetic complications: no   No notable events documented.  Last Vitals:  Vitals:   12/10/24 1200 12/10/24 1215  BP: 114/63 120/72  Pulse: 84 88  Resp: 19 18  Temp:  36.8 C  SpO2: 91% 93%    Last Pain:  Vitals:   12/10/24 1215  TempSrc:   PainSc: 0-No pain                 Rome Ade      "

## 2024-12-11 NOTE — Telephone Encounter (Signed)
 Bronch 1/15. NFN

## 2024-12-12 LAB — ACID FAST SMEAR (AFB, MYCOBACTERIA): Acid Fast Smear: NEGATIVE

## 2024-12-13 ENCOUNTER — Encounter (HOSPITAL_COMMUNITY): Payer: Self-pay

## 2024-12-14 LAB — FUNGUS CULTURE WITH STAIN

## 2024-12-14 LAB — FUNGUS CULTURE RESULT

## 2024-12-15 ENCOUNTER — Inpatient Hospital Stay

## 2024-12-15 VITALS — BP 107/69 | HR 79 | Temp 98.1°F | Resp 18

## 2024-12-15 DIAGNOSIS — K909 Intestinal malabsorption, unspecified: Secondary | ICD-10-CM

## 2024-12-15 DIAGNOSIS — R634 Abnormal weight loss: Secondary | ICD-10-CM

## 2024-12-15 LAB — AEROBIC/ANAEROBIC CULTURE W GRAM STAIN (SURGICAL/DEEP WOUND): Gram Stain: NONE SEEN

## 2024-12-15 LAB — SURGICAL PATHOLOGY

## 2024-12-15 LAB — CYTOLOGY - NON PAP

## 2024-12-15 MED ORDER — SODIUM CHLORIDE 0.9 % IV SOLN: INTRAVENOUS | Status: DC

## 2024-12-15 MED ORDER — IRON SUCROSE 300 MG IVPB - SIMPLE MED
300.0000 mg | Freq: Once | Status: AC
  Administered 2024-12-15: 300 mg via INTRAVENOUS
  Filled 2024-12-15: qty 300

## 2024-12-15 NOTE — Patient Instructions (Signed)

## 2024-12-18 ENCOUNTER — Encounter: Payer: Self-pay | Admitting: Acute Care

## 2024-12-18 ENCOUNTER — Encounter: Payer: Self-pay | Admitting: *Deleted

## 2024-12-18 ENCOUNTER — Ambulatory Visit: Admitting: Acute Care

## 2024-12-18 VITALS — BP 99/65 | HR 87 | Temp 97.7°F | Ht 70.0 in | Wt 165.0 lb

## 2024-12-18 DIAGNOSIS — G20A1 Parkinson's disease without dyskinesia, without mention of fluctuations: Secondary | ICD-10-CM

## 2024-12-18 DIAGNOSIS — R590 Localized enlarged lymph nodes: Secondary | ICD-10-CM

## 2024-12-18 DIAGNOSIS — R911 Solitary pulmonary nodule: Secondary | ICD-10-CM

## 2024-12-18 DIAGNOSIS — Z8571 Personal history of Hodgkin lymphoma: Secondary | ICD-10-CM | POA: Diagnosis not present

## 2024-12-18 DIAGNOSIS — R591 Generalized enlarged lymph nodes: Secondary | ICD-10-CM

## 2024-12-18 NOTE — Progress Notes (Signed)
 "  History of Present Illness Brian [name] is a 64 y.o. male never smoker with  recurrent Hodgkin's lymphoma who presents with hypermetabolic mediastinal lymph nodes on PET scan. He was referred by his oncologist, Dr. Corrin, for evaluation of hypermetabolic mediastinal lymph nodes, and consideration of bronchoscopy with EBUS and biopsy for tissue diagnosis.He was seen by Dr. Zaida.    12/18/2024 Discussed the use of AI scribe software for clinical note transcription with the patient, who gave verbal consent to proceed.  Synopsis Brian [name] is a 64 year old male with recurrent Hodgkin's lymphoma who presents with hypermetabolic mediastinal lymph nodes on PET scan. He was referred by his oncologist, Dr. Corrin, for evaluation of hypermetabolic mediastinal lymph nodes.   He has a history of recurrent Hodgkin's lymphoma, having undergone stem cell transplant, CAR T therapy and subsequently relapsed. He has been on nivolumab  every three months, completing eighteen cycles, but this is currently on hold. A PET scan in December 2025 showed hypermetabolic mediastinal lymph nodes.  He has a history of Parkinson's Disease , and was previously on Xaralto for blood clots, this was discontinued 04/2024.   He was referred to see Dr.Hattar 11/25/2024 for consideration of bronchoscopy with EBUS for tissue diagnosis to guide treatment.Pt. underwent bronchoscopy with EBUS on 12/10/2024. He is here today to review cytology results and to ensure he has done well post procedure.   History of Present Illness Patient presents for follow-up after bronchoscopy with EBUS for tissue diagnosis of hypermetabolic  mediastinal lymph nodes.  Patient states he did well after the procedure.  He denies any bleeding, fever, discolored secretions, worsening shortness of breath, or adverse reaction to anesthesia.  He presents today with his significant other Brian [name].  He is having a bad day with his Parkinson's disease today.  He  is feeling lightheaded and is having difficulty focusing.  We discussed the results of his biopsy.  The biopsy of the 10 left lymph node was negative for malignant cells.  The biopsy of the 4R lymph node did have atypical cells present.  There was notation of rare large cells with irregular nuclear contours suggestive of possible Hodgkin cell however Brein Sternberg cells are not definitively morphologically identified.  Dr. Donzetta came into the clinic room to discuss the results of the biopsy with Brian [name] and his significant other.  They were offered repeat bronchoscopy with EBUS, with the obvious concern that the outcome might be the same, specifically indeterminate.  Dr. Donzetta also presented the option of referral to thoracic surgery and mediastinoscopy.  He did explain that this was a more invasive procedure.  Patient expressed a desire to reach out to his oncologist Dr. Timmy for further guidance regarding best next steps.  Dr. Donzetta suggested a follow-up PET scan the second week of February 2026 which will be 2 months since the previous PET. Reassessing the lymph nodes on PET for growth, or finding another more amendable lymph node for biopsy may help with tissue diagnosis.  Based on results of the PET scan we can determine if best options are for repeat bronchoscopy with EBUS, biopsy of another hypermetabolic nodule, or referral to thoracic surgery for mediastinoscopy.  This was all relayed to Dr. Timmy through his nurse Warren Sora, RN.  Per messaging Dr. Timmy is in agreement with the follow-up PET scan second week of February and then reevaluation of best site for tissue sampling.  Site will determine whether it will be done by pulmonary, thoracic  surgery, or perhaps interventional radiology.  Patient and his significant other are in agreement with the above plan.  He is understandably frustrated as he wants to start treatment as soon as possible.  Per Dr. Donzetta, Dr. Timmy needs  confirmed tissue sampling with diagnosis before he can start treatment.     Test Results: Cytology 12/10/2024 A. LYMPH NODE, 10L, FINE NEEDLE ASPIRATION:  - No malignant cells identified  B. LYMPH NODE, 4R, FINE NEEDLE ASPIRATION:  - Atypical cells present   Note: The clinical history of classical Hodgkin's lymphoma is noted. There is a scant cellularity, but there is a rare large cell with irregular nuclear contours and a prominent nucleoli suggestive of a possible Hodgkin's cell.  Cherry-red nucleoli, typically seen in both Hodgkin's and Reed-Sternberg cells, or bi-/multinucleation indicative of Reed-Sternberg cells are not definitively morphologically identified. In addition, there is insufficient material for further immunohistochemical studies.  Given the clinical history, the findings are suspicious for but not pathognomonic for involvement by the patient's-lymphoma.  Repeat biopsy as clinically indicated.   12/10/2024 Surgical Pathology Lymph node, mediastinal, flow cytometry:  -  Insufficient material for flow cytometric analysis   12/10/2024 Aerobic/ Anaerobic Culture4R lymph node Multiple organisms present, none predominant, no aerobes isolated  12/10/2024 Fungal >> Negative AFB>> Negative     Latest Ref Rng & Units 11/25/2024   11:35 AM 08/07/2024   12:03 PM 05/22/2024   11:33 AM  CBC  WBC 4.0 - 10.5 K/uL 4.1  5.7  5.7   Hemoglobin 13.0 - 17.0 g/dL 86.7  87.6  87.8   Hematocrit 39.0 - 52.0 % 40.2  37.2  37.1   Platelets 150 - 400 K/uL 194  225  231        Latest Ref Rng & Units 11/25/2024   11:35 AM 08/07/2024   12:03 PM 05/22/2024   11:33 AM  BMP  Glucose 70 - 99 mg/dL 892  99  98   BUN 8 - 23 mg/dL 36  35  44   Creatinine 0.61 - 1.24 mg/dL 8.84  8.70  8.54   Sodium 135 - 145 mmol/L 140  142  140   Potassium 3.5 - 5.1 mmol/L 4.7  5.0  4.6   Chloride 98 - 111 mmol/L 105  107  106   CO2 22 - 32 mmol/L 24  26  27    Calcium  8.9 - 10.3 mg/dL 9.1  9.4  9.3      BNP No results found for: BNP  ProBNP No results found for: PROBNP  PFT    Component Value Date/Time   FEV1PRE 2.86 06/16/2014 0811   FEV1POST 2.93 06/16/2014 0811   FVCPRE 5.31 06/16/2014 0811   FVCPOST 5.29 06/16/2014 0811   TLC 7.28 06/16/2014 0811   DLCOUNC 29.74 06/16/2014 0811   PREFEV1FVCRT 54 06/16/2014 0811   PSTFEV1FVCRT 56 06/16/2014 0811    DG Chest Port 1 View Result Date: 12/10/2024 EXAM: 1 VIEW(S) XRAY OF THE CHEST 12/10/2024 11:54:00 AM COMPARISON: Lateral radiographs of the chest dated 06/27/2022. CLINICAL HISTORY: S/P bronchoscopy. FINDINGS: LUNGS AND PLEURA: Low lung volumes. Elevated left hemidiaphragm. Linear opacities at left lung base, compatible with atelectasis or scarring. No pleural effusion. No pneumothorax. HEART AND MEDIASTINUM: No acute abnormality of the cardiac and mediastinal silhouettes. BONES AND SOFT TISSUES: Surgical clips in left hemithorax. No acute osseous abnormality. IMPRESSION: 1. Linear opacities at left lung base, compatible with atelectasis or scarring. 2. Low lung volumes and elevated left hemidiaphragm. Electronically signed by:  Evalene Coho MD 12/10/2024 12:10 PM EST RP Workstation: [id number]     Past medical hx Past Medical History:  Diagnosis Date   Anxiety    Dysrhythmia    past hx pvc   Goals of care, counseling/discussion 09/25/2018   H/O autologous stem cell transplant (HCC)    may 2014  at [facility]   History of Bell's palsy    2009  RIGHT SIDE--  HAS 80% FUNCTION / PT STATES A LITTLE ASYMETRICAL AND EFFECTS MOUTH   History of radiation therapy 10/18/17-11/05/17   sprine T1 26 Gy in 13 fractions, spine boost 10 Gy in 5 fractions   History of radiation therapy    Right hip, Rt Sclav- 07/23/22-08/07/11- Dr. 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 He has never been exposed to tobacco smoke. He has never used smokeless tobacco. He reports current alcohol use of about 14.0 standard drinks of alcohol per week. He reports that he does not use drugs.  Tobacco Cessation: Counseling given: Not Answered Never smoker  Past surgical hx, Family hx, Social hx all reviewed.  Current Outpatient Medications on File Prior to Visit  Medication Sig   ascorbic acid (VITAMIN C) 500 MG tablet Take 500 mg by mouth daily.   carbidopa -levodopa  (SINEMET  CR) 50-200 MG tablet TAKE 1 TABLET BY MOUTH AT BEDTIME   carbidopa -levodopa  (SINEMET  IR) 25-100 MG tablet TAKE 2 TABLETS BY MOUTH AT 7AM, 2 TABLETS AT 11AM, 2 TABLETS AT 3PM AND 2 TABLETS AT 7PM   cholecalciferol  (VITAMIN D3) 25 MCG (1000 UNIT) tablet Take 1,000 Units by mouth daily.   diphenhydramine -acetaminophen  (TYLENOL  PM) 25-500 MG TABS tablet Take 1 tablet by mouth at bedtime as needed.   LORazepam  (ATIVAN ) 0.5 MG tablet TAKE 1 TABLET(0.5 MG) BY MOUTH EVERY 6 HOURS AS NEEDED FOR ANXIETY   pramipexole  (MIRAPEX ) 0.5 MG tablet TAKE 1 TABLET(0.5 MG) BY MOUTH THREE TIMES DAILY   tadalafil  (CIALIS ) 5 MG tablet Take 1 tablet (5 mg total) by mouth daily.   Current Facility-Administered Medications on File Prior to Visit  Medication   sodium chloride  flush (NS) 0.9 % injection 10 mL     Allergies[2]  Review Of Systems:  Constitutional:   +  weight loss, No night sweats,  Fevers, chills, +fatigue, or  lassitude.  HEENT:   No headaches,  Difficulty swallowing,  Tooth/dental problems, or  Sore  throat,                No sneezing, itching, ear ache, +nasal congestion, No post nasal drip,   CV:  No chest pain,  Orthopnea, PND, swelling in lower extremities, anasarca, dizziness, palpitations, syncope.   GI  No heartburn, indigestion, abdominal pain, nausea, vomiting, diarrhea, change in bowel habits, loss of appetite, bloody stools.   Resp: No shortness of breath with exertion or at rest.  No excess mucus, no productive cough,  No non-productive cough,  No coughing up of blood.  No change in color of mucus.  No wheezing.  No chest wall deformity  Skin: no rash or lesions.  GU: no dysuria, change in color of urine, no urgency or frequency.  No flank pain, no hematuria   MS:  No joint pain or swelling.  No decreased range of motion.  No back pain.  Psych:  No change in mood or affect. No depression or anxiety.  No memory loss.   Vital Signs BP 99/65   Pulse 87   Temp 97.7 F (36.5 C) (Oral)   Ht 5' 10 (1.778 m)   Wt 165 lb (74.8 kg)   SpO2 96%   BMI 23.68 kg/m    Physical Exam:  General- No distress,  A&Ox3, pleasant, obviously does not feel well today. ENT: No sinus tenderness, TM clear, pale nasal mucosa, no oral exudate,no post nasal drip, no LAN Cardiac: S1, S2, regular rate and rhythm, no murmur Chest: No wheeze/ rales/ dullness; no accessory muscle use, no nasal flaring, no sternal retractions, slightly diminished per bases, good air movement throughout all lung fields Abd.: Soft Non-tender, nondistended, bowel sounds positive,Body mass index is 23.68 kg/m.  Ext: No clubbing cyanosis, edema, no obvious deformities Neuro: Alert and oriented x 3, moving all extremities x 4, physical deconditioning secondary to Parkinson's disease Skin: No rashes, warm and dry, no obvious skin lesions Psych: normal mood and behavior, appropriately frustrated as biopsy results were inconclusive  Physical Exam    Assessment/Plan Assessment & Plan Mediastinal  lymphadenopathy History of Hodgkin's lymphoma with recurrence Previous treatments include CAR T therapy, and nivolumab .  Differential diagnosis includes benign lymphadenopathy though history is concerning for lymphoma recurrence. Post bronchoscopy with EBUS and biopsy Biopsy results without definitive diagnosis Plan I am glad you did well after the bronchoscopy with biopsies.  Dr. Zaida and I  have reviewed your biopsy results with you.  They showed no malignant cells in the 10L node, and atypical cells in the 4R lymph node. This did not provide the definitive diagnosis we were hoping to get. We discussed different options for next best steps. Plan is for a PET scan after 01/11/2025.  We can re-evaluate the lymph nodes at that time.  You will meet with Dr. Zaida 1 week after the scan.  We have reached out to Dr. Timmy and he is aware of the results and the plan for follow-up PET imaging Dr. Zaida talked about repeat bronchoscopy with biopsies vs Mediastinoscopy which is done by thoracic surgery. After we get the PET scan done and reviewed, you can determine the next best steps.  We will reach out to Dr. Timmy to see if he will get you in for a discussion to help with decision making.  Call us  if you need us  .  Please contact office for sooner follow up if symptoms do not improve or worsen or seek emergency care    History of active Parkinson's disease requiring treatment Continued follow-up and treatment through neurology.  I spent 40 minutes dedicated to the care of this patient on the date of this encounter to include pre-visit review of records, face-to-face time with the patient discussing conditions above, post visit ordering of testing, clinical documentation with the electronic health record, making appropriate referrals as documented, and communicating necessary information to the patient's healthcare team.   Patient was seen as a combined visit with Dr. Zaida as he  spent  considerable time discussing biopsy results and treatment options with the patient and his significant other.  Patient will follow-up with Dr. Donzetta after repeat PET imaging mid April 2026   Brian JULIANNA Lites, NP 12/18/2024  5:27 PM             [1]  Social History Tobacco Use   Smoking status: Never    Passive exposure: Never   Smokeless tobacco: Never  Vaping Use   Vaping status: Never Used  Substance Use Topics   Alcohol use: Yes    Alcohol/week: 14.0 standard drinks of alcohol    Types: 7 Glasses of wine, 7 Cans of beer per week    Comment: one drink per night   Drug use: No  [2] No Known Allergies  "

## 2024-12-18 NOTE — Patient Instructions (Addendum)
 It is good to see you today. I am glad you did well after the bronchoscopy with biopsies.  Dr. Zaida and I  have reviewed your biopsy results with you.  They showed no malignant cells in the 10L node, and atypical cells in the 4R lymph node. This did not provide the definitive diagnosis we were hoping to get. We discussed different options for next best steps. Plan is for a PET scan after 01/11/2025.  We can re-evaluate the lymph nodes at that time.  You will meet with Dr. Zaida 1 week after the scan.  We have reached out to Dr. Timmy and he is aware of the results and the plan for follow-up PET imaging Dr. Zaida talked about repeat bronchoscopy with biopsies vs Mediastinoscopy which is done by thoracic surgery. After we get the PET scan done and reviewed, you can determine the next best steps.  We will reach out to Dr. Timmy to see if he will get you in for a discussion to help with decision making.  Call us  if you need us  .  Please contact office for sooner follow up if symptoms do not improve or worsen or seek emergency care

## 2024-12-31 ENCOUNTER — Encounter: Payer: Self-pay | Admitting: Hematology & Oncology

## 2025-01-05 ENCOUNTER — Encounter (HOSPITAL_COMMUNITY)

## 2025-01-13 ENCOUNTER — Ambulatory Visit

## 2025-05-14 ENCOUNTER — Ambulatory Visit: Payer: Self-pay | Admitting: Neurology
